# Patient Record
Sex: Male | Born: 1935
Health system: Southern US, Community
[De-identification: ages and names within clinical notes are randomized; demographics above are authoritative.]

## PROBLEM LIST (undated history)

## (undated) DIAGNOSIS — I4510 Unspecified right bundle-branch block: Secondary | ICD-10-CM

## (undated) DIAGNOSIS — K589 Irritable bowel syndrome without diarrhea: Secondary | ICD-10-CM

## (undated) DIAGNOSIS — C801 Malignant (primary) neoplasm, unspecified: Secondary | ICD-10-CM

## (undated) DIAGNOSIS — I700 Atherosclerosis of aorta: Secondary | ICD-10-CM

## (undated) DIAGNOSIS — E785 Hyperlipidemia, unspecified: Secondary | ICD-10-CM

## (undated) DIAGNOSIS — I255 Ischemic cardiomyopathy: Secondary | ICD-10-CM

## (undated) DIAGNOSIS — I219 Acute myocardial infarction, unspecified: Secondary | ICD-10-CM

## (undated) DIAGNOSIS — K449 Diaphragmatic hernia without obstruction or gangrene: Secondary | ICD-10-CM

## (undated) DIAGNOSIS — N401 Enlarged prostate with lower urinary tract symptoms: Secondary | ICD-10-CM

## (undated) DIAGNOSIS — G8929 Other chronic pain: Secondary | ICD-10-CM

## (undated) DIAGNOSIS — M47812 Spondylosis without myelopathy or radiculopathy, cervical region: Secondary | ICD-10-CM

## (undated) DIAGNOSIS — M545 Low back pain, unspecified: Secondary | ICD-10-CM

## (undated) DIAGNOSIS — I10 Essential (primary) hypertension: Secondary | ICD-10-CM

## (undated) DIAGNOSIS — G2581 Restless legs syndrome: Secondary | ICD-10-CM

## (undated) DIAGNOSIS — I2510 Atherosclerotic heart disease of native coronary artery without angina pectoris: Secondary | ICD-10-CM

## (undated) DIAGNOSIS — R55 Syncope and collapse: Secondary | ICD-10-CM

## (undated) DIAGNOSIS — D126 Benign neoplasm of colon, unspecified: Secondary | ICD-10-CM

## (undated) DIAGNOSIS — N138 Other obstructive and reflux uropathy: Secondary | ICD-10-CM

## (undated) DIAGNOSIS — K219 Gastro-esophageal reflux disease without esophagitis: Secondary | ICD-10-CM

## (undated) DIAGNOSIS — Z789 Other specified health status: Secondary | ICD-10-CM

## (undated) HISTORY — DX: Hyperlipidemia, unspecified: E78.5

## (undated) HISTORY — DX: Benign prostatic hyperplasia with lower urinary tract symptoms: N40.1

## (undated) HISTORY — DX: Unspecified right bundle-branch block: I45.10

## (undated) HISTORY — DX: Other specified health status: Z78.9

## (undated) HISTORY — DX: Benign neoplasm of colon, unspecified: D12.6

## (undated) HISTORY — DX: Syncope and collapse: R55

## (undated) HISTORY — DX: Low back pain, unspecified: M54.50

## (undated) HISTORY — DX: Irritable bowel syndrome, unspecified: K58.9

## (undated) HISTORY — DX: Benign prostatic hyperplasia with lower urinary tract symptoms: N13.8

## (undated) HISTORY — PX: TONSILLECTOMY: SUR1361

## (undated) HISTORY — DX: Ischemic cardiomyopathy: I25.5

## (undated) HISTORY — PX: OTHER SURGICAL HISTORY: SHX169

## (undated) HISTORY — DX: Essential (primary) hypertension: I10

## (undated) HISTORY — DX: Atherosclerosis of aorta: I70.0

## (undated) HISTORY — DX: Gastro-esophageal reflux disease without esophagitis: K21.9

## (undated) HISTORY — DX: Acute myocardial infarction, unspecified: I21.9

## (undated) HISTORY — DX: Spondylosis without myelopathy or radiculopathy, cervical region: M47.812

## (undated) HISTORY — DX: Diaphragmatic hernia without obstruction or gangrene: K44.9

## (undated) HISTORY — DX: Atherosclerotic heart disease of native coronary artery without angina pectoris: I25.10

## (undated) HISTORY — DX: Other chronic pain: G89.29

## (undated) HISTORY — DX: Low back pain: M54.5

## (undated) HISTORY — DX: Restless legs syndrome: G25.81

---

## 2001-12-28 HISTORY — PX: LUMBAR FUSION: SHX111

## 2002-01-31 ENCOUNTER — Encounter: Payer: Self-pay | Admitting: Family Medicine

## 2004-02-05 ENCOUNTER — Encounter: Payer: Self-pay | Admitting: Cardiology

## 2010-06-12 ENCOUNTER — Encounter: Payer: Self-pay | Admitting: Family Medicine

## 2010-06-25 ENCOUNTER — Encounter: Payer: Self-pay | Admitting: Family Medicine

## 2010-10-06 ENCOUNTER — Telehealth: Payer: Self-pay | Admitting: Family Medicine

## 2010-10-15 ENCOUNTER — Ambulatory Visit: Payer: Self-pay | Admitting: Family Medicine

## 2010-10-15 ENCOUNTER — Encounter: Payer: Self-pay | Admitting: Family Medicine

## 2010-10-15 DIAGNOSIS — I25118 Atherosclerotic heart disease of native coronary artery with other forms of angina pectoris: Secondary | ICD-10-CM | POA: Insufficient documentation

## 2010-10-15 DIAGNOSIS — N401 Enlarged prostate with lower urinary tract symptoms: Secondary | ICD-10-CM | POA: Insufficient documentation

## 2010-10-15 DIAGNOSIS — I252 Old myocardial infarction: Secondary | ICD-10-CM | POA: Insufficient documentation

## 2010-10-15 DIAGNOSIS — G2581 Restless legs syndrome: Secondary | ICD-10-CM | POA: Insufficient documentation

## 2010-10-15 DIAGNOSIS — Z860100 Personal history of colon polyps, unspecified: Secondary | ICD-10-CM | POA: Insufficient documentation

## 2010-10-15 DIAGNOSIS — N138 Other obstructive and reflux uropathy: Secondary | ICD-10-CM | POA: Insufficient documentation

## 2010-10-15 DIAGNOSIS — K219 Gastro-esophageal reflux disease without esophagitis: Secondary | ICD-10-CM | POA: Insufficient documentation

## 2010-10-15 DIAGNOSIS — I10 Essential (primary) hypertension: Secondary | ICD-10-CM | POA: Insufficient documentation

## 2010-10-15 DIAGNOSIS — N400 Enlarged prostate without lower urinary tract symptoms: Secondary | ICD-10-CM | POA: Insufficient documentation

## 2010-10-15 DIAGNOSIS — E785 Hyperlipidemia, unspecified: Secondary | ICD-10-CM | POA: Insufficient documentation

## 2010-10-15 LAB — CONVERTED CEMR LAB
Blood in Urine, dipstick: NEGATIVE
Glucose, Urine, Semiquant: NEGATIVE
Specific Gravity, Urine: 1.01
WBC Urine, dipstick: NEGATIVE
pH: 7

## 2010-10-17 ENCOUNTER — Telehealth: Payer: Self-pay | Admitting: Family Medicine

## 2010-10-17 LAB — CONVERTED CEMR LAB
AST: 26 units/L (ref 0–37)
Albumin: 4.3 g/dL (ref 3.5–5.2)
Basophils Absolute: 0 10*3/uL (ref 0.0–0.1)
CO2: 24 meq/L (ref 19–32)
Chloride: 107 meq/L (ref 96–112)
Eosinophils Absolute: 0.1 10*3/uL (ref 0.0–0.7)
Glucose, Bld: 91 mg/dL (ref 70–99)
HCT: 40.1 % (ref 39.0–52.0)
Hemoglobin: 13.9 g/dL (ref 13.0–17.0)
Lymphs Abs: 1.5 10*3/uL (ref 0.7–4.0)
MCHC: 34.6 g/dL (ref 30.0–36.0)
Neutro Abs: 2.6 10*3/uL (ref 1.4–7.7)
Potassium: 4.6 meq/L (ref 3.5–5.1)
RDW: 12.9 % (ref 11.5–14.6)
Sodium: 139 meq/L (ref 135–145)
TSH: 0.64 microintl units/mL (ref 0.35–5.50)
Total Protein: 7 g/dL (ref 6.0–8.3)

## 2010-10-30 ENCOUNTER — Ambulatory Visit: Payer: Self-pay | Admitting: Cardiology

## 2010-11-03 ENCOUNTER — Telehealth: Payer: Self-pay | Admitting: Cardiology

## 2010-11-10 ENCOUNTER — Telehealth (INDEPENDENT_AMBULATORY_CARE_PROVIDER_SITE_OTHER): Payer: Self-pay | Admitting: *Deleted

## 2010-11-27 ENCOUNTER — Encounter: Payer: Self-pay | Admitting: Family Medicine

## 2010-12-26 ENCOUNTER — Encounter: Payer: Self-pay | Admitting: Family Medicine

## 2011-01-27 NOTE — Progress Notes (Signed)
Summary: Request to be Worked In if Possible  Phone Note Call from Patient Call back at Home Phone [phone]   Caller: [name] Summary of Call: New pt appt scheduled for 11/25/10, if possible would like to be seen sooner.  Not having any problems at this time so if he can't be worked in sooner it is not a problem. Initial call taken by: Trixie Dredge,  October 06, 2010 3:00 PM  Follow-up for Phone Call        okay to work him in sometime soon  Follow-up by: Nelwyn Salisbury MD,  October 06, 2010 5:59 PM  Additional Follow-up for Phone Call Additional follow up Details #1::        appt scheduled for 10/15/10 @ 1:00pm Additional Follow-up by: Trixie Dredge,  October 07, 2010 9:21 AM

## 2011-01-27 NOTE — Progress Notes (Signed)
Summary: has ? about his labwork  Phone Note Call from Patient Call back at Home Phone [phone]   Caller: Patient---triage vm Reason for Call: Talk to Nurse Summary of Call: returning a call. has ? about his labwork. Initial call taken by: Warnell Forester,  October 17, 2010 11:26 AM  Follow-up for Phone Call        wants copy labs to be mailed done.  Follow-up by: Pura Spice, RN,  October 17, 2010 11:29 AM

## 2011-01-27 NOTE — Progress Notes (Signed)
  ROI faxed to [facility],Received Release back today chart was warehoused and [facility]  will copy records and Mail. Cala Bradford Mesiemore  November 10, 2010 11:45 AM     Appended Document:  Records received from [facility] gave to [facility]

## 2011-01-27 NOTE — Letter (Signed)
Summary: Records from Dr. Gerrit Friends. [place] 1996 - 2011  Records from Dr. Gerrit Friends. [place] 1996 - 2011   Imported By: Maryln Gottron 10/20/2010 14:02:55  _____________________________________________________________________  External Attachment:    Type:   Image     Comment:   External Document

## 2011-01-27 NOTE — Progress Notes (Signed)
Summary: re obtaining medical records  Phone Note Call from Patient   Caller: Patient [phone] Summary of Call: pt calling re [place] getting medical records from [facility] [phone] date  10-2001 by dr Lorenz Coaster at [facility] [address] [place]  [phone]  Initial call taken by: Glynda Jaeger,  November 03, 2010 9:17 AM    Release Of Information faxed to [facility] @ [phone] [facility]  November 05, 2010 10:03 AM

## 2011-01-27 NOTE — Letter (Signed)
Summary: [facility] - 2001 to 2005  [facility] - 2001 to 2005   Imported By: Marylou Mccoy 11/27/2010 10:03:54  _____________________________________________________________________  External Attachment:    Type:   Image     Comment:   External Document

## 2011-01-27 NOTE — Assessment & Plan Note (Signed)
Summary: np6/s/p cabg/MI/lg   Primary Provider:  Dr. Clent Ridges   History of Present Illness: 76 yo with history of CAD s/p MI in 1995 and CABG x 3 in 2002 presents to establish cardiology care. Patient reports his last stress test in 2008, and says that he was told that it was normal.  He is not on a statin as he says that multiple statins have caused him to get flu-like symptoms and elevated LFTs.  He is doing well in general.  He walks on a treadmill and lifts weight 3+ days/week.  He walks around the neighborhood with his wife.  No exertional dyspnea or chest pain.  He recently moved to [facility] from [place].  He no longer smokes (quit years ago).    ECG: NSR, RBBB, LAFB, old anterior MI  Labs (10/11): LDL 120, HDL 48, K 4.6, creatinine 1.1, TSH normal  Current Medications (verified): 1)  Tramadol Hcl 50 Mg Tabs (Tramadol Hcl) .... As Needed 2)  Pramipexole Dihydrochloride 1 Mg Tabs (Pramipexole Dihydrochloride) .Marland Kitchen.. 1and 1/2 At Bedtime 3)  Lisinopril 5 Mg Tabs (Lisinopril) .... Once Daily 4)  Nexium 40 Mg Cpdr (Esomeprazole Magnesium) .... Once Daily 5)  Aspir-Low 81 Mg Tbec (Aspirin) .... Once Daily 6)  Cholest Off 450 Mg Tabs (Plant Sterols and Stanols) .... Once Daily 7)  Omega 3 .... Once Daily 8)  Calcium .... Once Daily 9)  Saw Palmetto .... Once Daily 10)  New Vitality Ageless Male .... 800mg  By Mouth Two Times A Day.  Allergies: 1)  ! * Anthistamines 2)  ! * Statins  Past History:  Past Medical History: 1. Colonic polyps, hx of 2. Coronary artery disease: MI 1995, CABG x 3 2002 (patient says that he had LIMA and RIMA grafts), last stress in 2008 was normal per patient's report.  3. Hyperlipidemia: Intolerant to statins due to elevated LFTs and flu-like symptoms.  Says he has tried multiple statin.  4. Hypertension 5. UTIs 6. IBS 7. restless legs 8. Benign prostatic hypertrophy 9. GERD 10. History of lumbar fusion  Family History: Reviewed history from 10/15/2010 and no  changes required. Family History of CAD Male 1st degree relative <50 Family History Diabetes 1st degree relative Family History Hypertension  Social History: Retired, moved to [place] from [place] Married, has children in [place].  Former Smoker, quit around 1980.  Alcohol use-yes  Review of Systems       All systems reviewed and negative except as per HPI.   Vital Signs:  Patient profile:   76 year old male Height:      69.5 inches Weight:      196 pounds Pulse rate:   64 / minute Resp:     14 per minute BP sitting:   122 / 70  (right arm)  Vitals Entered By: Ellender Hose RN (October 30, 2010 11:47 AM)  Physical Exam  General:  Well developed, well nourished, in no acute distress. Head:  normocephalic and atraumatic Nose:  no deformity, discharge, inflammation, or lesions Mouth:  Teeth, gums and palate normal. Oral mucosa normal. Neck:  Neck supple, no JVD. No masses, thyromegaly or abnormal cervical nodes. Lungs:  Clear bilaterally to auscultation and percussion. Heart:  Non-displaced PMI, chest non-tender; regular rate and rhythm, S1, S2 without murmurs, rubs or gallops. Carotid upstroke normal, no bruit.  Pedals normal pulses. No edema, no varicosities. Abdomen:  Bowel sounds positive; abdomen soft and non-tender without masses, organomegaly, or hernias noted. No hepatosplenomegaly. Msk:  Back normal, normal gait. Muscle strength and tone normal. Extremities:  No clubbing or cyanosis. Neurologic:  Alert and oriented x 3. Skin:  Intact without lesions or rashes. Psych:  Normal affect.   Impression & Recommendations:  Problem # 1:  CORONARY ARTERY DISEASE (ICD-414.00) Patient has Qs on ECG suggestive of anterior MI.  He is having no exertional dyspnea or chest pain.  No significant cardiac symptoms at this time.   - Echo to assess LV function given extensive Qs.  - Continue ASA, lisinopril - Will try to get notes from prior cardiologist.  - Followup 1  year.   Problem # 2:  HYPERLIPIDEMIA (ICD-272.4) Patient has not been able to tolerate multiple statins due to flu-like illness and elevated LFTs. I will have him try Niaspan 1000 mg daily.  If he does not tolerate this, Zetia would be an option.   Patient Instructions: 1)  Your physician recommends that you schedule a follow-up appointment in: 1 yr with Dr Shirlee Latch 2)  Your physician has recommended you make the following change in your medication: start Niaspan 500 mg every evening 30 minutes after your asa.  Take this dose for one week then increase to 1,000 mg every evening. 3)  Your physician has requested that you have an echocardiogram in one year.  Echocardiography is a painless test that uses sound waves to create images of your heart. It provides your doctor with information about the size and shape of your heart and how well your heart's chambers and valves are working.  This procedure takes approximately one hour. There are no restrictions for this procedure. Prescriptions: NIASPAN 1000 MG CR-TABS (NIACIN (ANTIHYPERLIPIDEMIC)) one every evening 30 mins after your asa  #30 x 6   Entered by:   Charolotte Capuchin, RN   Authorized by:   Marca Ancona, MD   Signed by:   Charolotte Capuchin, RN on 10/30/2010   Method used:   Electronically to        [facility] [address] [id number]* (retail)       [address]       [place], [place]  [address]       Ph: [phone]       Fax: [phone]   RxID:   [id number]

## 2011-01-27 NOTE — Assessment & Plan Note (Signed)
Summary: MEDICARE WELLNESS EXAM//SLM   Vital Signs:  Patient profile:   76 year old male Height:      69.5 inches Weight:      195 pounds BMI:     28.49 Pulse rate:   60 / minute BP sitting:   120 / 72  (left arm)  Vitals Entered By: Pura Spice, RN (October 15, 2010 1:18 PM) CC: cpx fasting    History of Present Illness: 76 yr old male to establish with [place] after moving here from [place], [place] about 2 weeks ago. He is also for a cpx. He and his wife are here to spend time with their grandchildren. He feels good and is quite active.   Preventive Screening-Counseling & Management  Alcohol-Tobacco     Smoking Status: quit  Allergies (verified): 1)  ! * Anthistamines 2)  ! * Statins  Past History:  Past Medical History: Colonic polyps, hx of Coronary artery disease Hyperlipidemia Hypertension Myocardial infarction in 1995 UTIs last stress test was in 2008, normal IBS restless legs Benign prostatic hypertrophy GERD  Past Surgical History: Coronary artery bypass graft, 3 vessel, in 2002 Tonsillectomy last colonoscopy in July 2011, one benign polyp, repeat in 5 yrs Lumbar fusion in 2003 at L3-L4, using bone autografts and cadaveric bone grafts  Family History: Reviewed history and no changes required. Family History of CAD Male 1st degree relative <50 Family History Diabetes 1st degree relative Family History Hypertension  Social History: Reviewed history and no changes required. Retired Married Former Smoker Alcohol use-yes Smoking Status:  quit  Review of Systems  The patient denies anorexia, fever, weight loss, weight gain, vision loss, decreased hearing, hoarseness, chest pain, syncope, dyspnea on exertion, peripheral edema, prolonged cough, headaches, hemoptysis, abdominal pain, melena, hematochezia, severe indigestion/heartburn, hematuria, incontinence, genital sores, muscle weakness, suspicious skin lesions, transient blindness, difficulty walking,  depression, unusual weight change, abnormal bleeding, enlarged lymph nodes, angioedema, breast masses, and testicular masses.         Flu Vaccine Consent Questions     Do you have a history of severe allergic reactions to this vaccine? no    Any prior history of allergic reactions to egg and/or gelatin? no    Do you have a sensitivity to the preservative Thimersol? no    Do you have a past history of Guillan-Barre Syndrome? no    Do you currently have an acute febrile illness? no    Have you ever had a severe reaction to latex? no    Vaccine information given and explained to patient? yes    Are you currently pregnant? no    Lot Number:[id number]   Exp Date:06/27/2011   Site Given  Left Deltoid IM Pura Spice, RN  October 15, 2010 1:19 PM   Physical Exam  General:  Well-developed,well-nourished,in no acute distress; alert,appropriate and cooperative throughout examination Head:  Normocephalic and atraumatic without obvious abnormalities. No apparent alopecia or balding. Eyes:  No corneal or conjunctival inflammation noted. EOMI. Perrla. Funduscopic exam benign, without hemorrhages, exudates or papilledema. Vision grossly normal. Ears:  External ear exam shows no significant lesions or deformities.  Otoscopic examination reveals clear canals, tympanic membranes are intact bilaterally without bulging, retraction, inflammation or discharge. Hearing is grossly normal bilaterally. Nose:  External nasal examination shows no deformity or inflammation. Nasal mucosa are pink and moist without lesions or exudates. Mouth:  Oral mucosa and oropharynx without lesions or exudates.  Teeth in good repair. Neck:  No deformities, masses, or tenderness  noted. Chest Wall:  No deformities, masses, tenderness or gynecomastia noted. Lungs:  Normal respiratory effort, chest expands symmetrically. Lungs are clear to auscultation, no crackles or wheezes. Heart:  Normal rate and regular rhythm. S1 and S2 normal  without gallop, murmur, click, rub or other extra sounds. EKG normal with RBBB (at his baseline). Abdomen:  Bowel sounds positive,abdomen soft and non-tender without masses, organomegaly or hernias noted. Rectal:  No external abnormalities noted. Normal sphincter tone. No rectal masses or tenderness. Heme neg. Genitalia:  Testes bilaterally descended without nodularity, tenderness or masses. No scrotal masses or lesions. No penis lesions or urethral discharge. Prostate:  no nodules, no asymmetry, no induration, and 2+ enlarged.   Msk:  No deformity or scoliosis noted of thoracic or lumbar spine.   Pulses:  R and L carotid,radial,femoral,dorsalis pedis and posterior tibial pulses are full and equal bilaterally Extremities:  No clubbing, cyanosis, edema, or deformity noted with normal full range of motion of all joints.   Neurologic:  No cranial nerve deficits noted. Station and gait are normal. Plantar reflexes are down-going bilaterally. DTRs are symmetrical throughout. Sensory, motor and coordinative functions appear intact. Skin:  Intact without suspicious lesions or rashes Cervical Nodes:  No lymphadenopathy noted Axillary Nodes:  No palpable lymphadenopathy Inguinal Nodes:  No significant adenopathy Psych:  Cognition and judgment appear intact. Alert and cooperative with normal attention span and concentration. No apparent delusions, illusions, hallucinations   Impression & Recommendations:  Problem # 1:  BENIGN PROSTATIC HYPERTROPHY, WITH OBSTRUCTION (ICD-600.01)  Orders: TLB-PSA (Prostate Specific Antigen) (84153-PSA)  Problem # 2:  RESTLESS LEGS SYNDROME (ICD-333.94)  Problem # 3:  MYOCARDIAL INFARCTION, HX OF (ICD-412)  His updated medication list for this problem includes:    Lisinopril 5 Mg Tabs (Lisinopril) ..... Once daily    Aspir-low 81 Mg Tbec (Aspirin) ..... Once daily  Problem # 4:  HYPERTENSION (ICD-401.9)  His updated medication list for this problem includes:     Lisinopril 5 Mg Tabs (Lisinopril) ..... Once daily  Orders: UA Dipstick w/o Micro (automated)  (81003) Venipuncture (04540) TLB-Lipid Panel (80061-LIPID) TLB-BMP (Basic Metabolic Panel-BMET) (80048-METABOL) TLB-CBC Platelet - w/Differential (85025-CBCD) TLB-Hepatic/Liver Function Pnl (80076-HEPATIC) TLB-TSH (Thyroid Stimulating Hormone) (84443-TSH)  Problem # 5:  HYPERLIPIDEMIA (ICD-272.4)  Problem # 6:  CORONARY ARTERY DISEASE (ICD-414.00)  His updated medication list for this problem includes:    Lisinopril 5 Mg Tabs (Lisinopril) ..... Once daily    Aspir-low 81 Mg Tbec (Aspirin) ..... Once daily  Orders: EKG w/ Interpretation (93000) Cardiology Referral (Cardiology)  Problem # 7:  COLONIC POLYPS, HX OF (ICD-V12.72)  Orders: Hemoccult Guaiac-1 spec.(in office) (82270)  Complete Medication List: 1)  Tramadol Hcl 50 Mg Tabs (Tramadol hcl) .... As needed 2)  Pramipexole Dihydrochloride 1 Mg Tabs (Pramipexole dihydrochloride) .Marland Kitchen.. 1and 1/2 at bedtime 3)  Lisinopril 5 Mg Tabs (Lisinopril) .... Once daily 4)  Nexium 40 Mg Cpdr (Esomeprazole magnesium) .... Once daily 5)  Aspir-low 81 Mg Tbec (Aspirin) .... Once daily 6)  Cholest Off 450 Mg Tabs (Plant sterols and stanols) .... Once daily 7)  Omega 3  .... Once daily 8)  Calcium  .... Once daily 9)  Saw Palmetto  .... Once daily  Other Orders: Flu Vaccine 37yrs + MEDICARE PATIENTS (J8119) Administration Flu vaccine - MCR (J4782)  Patient Instructions: 1)  get fasting labs today. 2)  Will refer to Cardiology to follow him along with [place].    Orders Added: 1)  Flu Vaccine 61yrs + MEDICARE PATIENTS [  Q2039] 2)  Administration Flu vaccine - MCR [G0008] 3)  New Patient Level IV [99204] 4)  UA Dipstick w/o Micro (automated)  [81003] 5)  Hemoccult Guaiac-1 spec.(in office) [82270] 6)  EKG w/ Interpretation [93000] 7)  Venipuncture [36415] 8)  TLB-Lipid Panel [80061-LIPID] 9)  TLB-BMP (Basic Metabolic Panel-BMET)  [80048-METABOL] 10)  TLB-CBC Platelet - w/Differential [85025-CBCD] 11)  TLB-Hepatic/Liver Function Pnl [80076-HEPATIC] 12)  TLB-TSH (Thyroid Stimulating Hormone) [84443-TSH] 13)  TLB-PSA (Prostate Specific Antigen) [09811-BJY] 14)  Cardiology Referral [Cardiology]    Laboratory Results   Urine Tests  Date/Time Recieved: October 15, 2010 3:19 PM  Date/Time Reported: October 15, 2010 3:19 PM   Routine Urinalysis   Color: yellow Appearance: Clear Glucose: negative   (Normal Range: Negative) Bilirubin: negative   (Normal Range: Negative) Ketone: negative   (Normal Range: Negative) Spec. Gravity: 1.010   (Normal Range: 1.003-1.035) Blood: negative   (Normal Range: Negative) pH: 7.0   (Normal Range: 5.0-8.0) Protein: negative   (Normal Range: Negative) Urobilinogen: 0.2   (Normal Range: 0-1) Nitrite: negative   (Normal Range: Negative) Leukocyte Esterace: negative   (Normal Range: Negative)    Comments: Wynona Canes, CMA  October 15, 2010 3:19 PM     Appended Document: MEDICARE WELLNESS EXAM//SLM In addition to the HPI above: The patient is active and exercises regularly. He denies any SOB or chest pain despite his hx of CAD. he has some mild prostatic symptoms and has nocturia once or twice a night. He follows his BP closely at home, and it remains stable. He watches his diet carefully, especially as far as fats go. His Bms are regular ,and he denies seeing any blood or dark stools.

## 2011-01-27 NOTE — Letter (Signed)
Summary: Records from [facility] 2006 - 2011  Records from [facility] 2006 - 2011   Imported By: Maryln Gottron 11/11/2010 11:05:21  _____________________________________________________________________  External Attachment:    Type:   Image     Comment:   External Document

## 2011-01-29 NOTE — Letter (Signed)
Summary: [facility]  [facility]   Imported By: Maryln Gottron 01/07/2011 13:51:46  _____________________________________________________________________  External Attachment:    Type:   Image     Comment:   External Document

## 2011-01-29 NOTE — Letter (Signed)
Summary: [facility]  [facility]   Imported By: Maryln Gottron 12/10/2010 14:57:30  _____________________________________________________________________  External Attachment:    Type:   Image     Comment:   External Document

## 2011-07-19 ENCOUNTER — Other Ambulatory Visit: Payer: Self-pay | Admitting: Cardiology

## 2011-07-29 ENCOUNTER — Telehealth: Payer: Self-pay | Admitting: Family Medicine

## 2011-07-29 MED ORDER — TRAMADOL HCL 50 MG PO TABS
50.0000 mg | ORAL_TABLET | Freq: Four times a day (QID) | ORAL | Status: DC | PRN
Start: 2011-07-29 — End: 2012-05-07

## 2011-07-29 MED ORDER — LISINOPRIL 5 MG PO TABS: 5.0000 mg | ORAL_TABLET | Freq: Every day | ORAL | Status: DC

## 2011-07-29 MED ORDER — PRAMIPEXOLE DIHYDROCHLORIDE 1 MG PO TABS: 1.0000 mg | ORAL_TABLET | Freq: Every day | ORAL | Status: DC

## 2011-07-29 NOTE — Telephone Encounter (Signed)
The patient is calling for refills on meds. He moved here from [place], [place] in October 2011 and this will be the first time he needs refills here. Please call in the following=  Tramadol 50 mg, take 4 times a day, #120 with 5 rf, also Mirapex 1 mg, take 1 and 1/2 tabs at bedtime, #45 with 11 rf, ans also Lisinopril 5 mg qd, #30 with 11 rf to [facility] at [phone]

## 2011-07-29 NOTE — Telephone Encounter (Signed)
Please call these in  

## 2011-07-29 NOTE — Telephone Encounter (Signed)
Scripts called in

## 2011-11-25 ENCOUNTER — Encounter: Payer: Self-pay | Admitting: Cardiology

## 2011-11-25 ENCOUNTER — Ambulatory Visit (INDEPENDENT_AMBULATORY_CARE_PROVIDER_SITE_OTHER): Payer: Medicare Other | Admitting: Cardiology

## 2011-11-25 DIAGNOSIS — I2510 Atherosclerotic heart disease of native coronary artery without angina pectoris: Secondary | ICD-10-CM

## 2011-11-25 DIAGNOSIS — E7800 Pure hypercholesterolemia, unspecified: Secondary | ICD-10-CM

## 2011-11-25 DIAGNOSIS — E785 Hyperlipidemia, unspecified: Secondary | ICD-10-CM

## 2011-11-25 DIAGNOSIS — I25810 Atherosclerosis of coronary artery bypass graft(s) without angina pectoris: Secondary | ICD-10-CM

## 2011-11-25 LAB — HEPATIC FUNCTION PANEL
Albumin: 4.4 g/dL (ref 3.5–5.2)
Total Protein: 7.1 g/dL (ref 6.0–8.3)

## 2011-11-25 LAB — CBC WITH DIFFERENTIAL/PLATELET
Basophils Absolute: 0 10*3/uL (ref 0.0–0.1)
Basophils Relative: 0.7 % (ref 0.0–3.0)
Hemoglobin: 13.4 g/dL (ref 13.0–17.0)
Lymphocytes Relative: 44.2 % (ref 12.0–46.0)
Monocytes Relative: 9.5 % (ref 3.0–12.0)
Neutro Abs: 2 10*3/uL (ref 1.4–7.7)
RBC: 3.81 Mil/uL — ABNORMAL LOW (ref 4.22–5.81)
RDW: 13.4 % (ref 11.5–14.6)
WBC: 4.7 10*3/uL (ref 4.5–10.5)

## 2011-11-25 LAB — BASIC METABOLIC PANEL
CO2: 22 mEq/L (ref 19–32)
Chloride: 108 mEq/L (ref 96–112)
Creatinine, Ser: 1.1 mg/dL (ref 0.4–1.5)

## 2011-11-25 LAB — LIPID PANEL
Cholesterol: 186 mg/dL (ref 0–200)
HDL: 50.8 mg/dL (ref >39.00)
LDL Cholesterol: 112 mg/dL — ABNORMAL HIGH (ref 0–99)
Triglycerides: 118 mg/dL (ref 0.0–149.0)
VLDL: 23.6 mg/dL (ref 0.0–40.0)

## 2011-11-25 MED ORDER — NIACIN ER (ANTIHYPERLIPIDEMIC) 1000 MG PO TBCR: 2000.0000 mg | EXTENDED_RELEASE_TABLET | Freq: Every day | ORAL | Status: DC

## 2011-11-25 NOTE — Patient Instructions (Addendum)
  Your physician has requested that you have an echocardiogram. Echocardiography is a painless test that uses sound waves to create images of your heart. It provides your doctor with information about the size and shape of your heart and how well your heart's chambers and valves are working. This procedure takes approximately one hour. There are no restrictions for this procedure.   Please have blood work done today.  BMET, Lipid, Hepatic function, and CBC; 272.0, 414.05  Your physician has recommended you make the following change in your medication: Increase your Niaspan to 2000 mg daily.  This will be two tablets of the 1000 mg every evening  Your physician wants you to follow-up in: 1 year.   You will receive a reminder letter in the mail two months in advance. If you don't receive a letter, please call our office to schedule the follow-up appointment.

## 2011-11-26 ENCOUNTER — Encounter: Payer: Self-pay | Admitting: Cardiology

## 2011-11-26 NOTE — Assessment & Plan Note (Signed)
History of CABG.  No ischemic symptoms.  I would like him to get an echo for baseline evaluation of LV systolic function.  He has extensive Qs on ECG.  Continue ASA 81, ACEI, niaspan.

## 2011-11-26 NOTE — Progress Notes (Signed)
PCP: Dr. Clent Ridges  76 yo with history of CAD s/p MI in 1995 and CABG x 3 in 2002 presents for cardiology followup. Patient reports his last stress test in 2008, and says that he was told that it was normal. He is not on a statin as he says that multiple statins have caused him to get flu-like symptoms and elevated LFTs. I had him start Niaspan last year.  He is doing well in general. He walks on a treadmill and lifts weight 3+ days/week. He walks around the neighborhood with his wife and does yardwork like raking. No exertional dyspnea or chest pain. Occasional GERD after meals.    ECG: NSR, RBBB, LAFB, old anterior and lateral MIs   Labs (10/11): LDL 120, HDL 48, K 4.6, creatinine 1.1, TSH normal   Allergies:  1) ! * Anthistamines  2) ! * Statins   Past Medical History:  1. Colonic polyps, hx of  2. Coronary artery disease: MI 1995, CABG x 3 2002 (patient says that he had LIMA and RIMA grafts), last stress in 2008 was normal per patient's report.  3. Hyperlipidemia: Intolerant to statins due to elevated LFTs and flu-like symptoms. Says he has tried multiple statin.  4. Hypertension  5. UTIs  6. IBS  7. restless legs  8. Benign prostatic hypertrophy  9. GERD  10. History of lumbar fusion   Family History:  Family History of CAD Male 1st degree relative <50  Family History Diabetes 1st degree relative  Family History Hypertension   Social History:  Retired, moved to [place] from [place]  Married, has children in [place].  Former Smoker, quit around 1980.  Alcohol use-yes   Review of Systems  All systems reviewed and negative except as per HPI.   Current Outpatient Prescriptions  Medication Sig Dispense Refill  . aspirin 81 MG tablet Take 81 mg by mouth daily.        Marland Kitchen lisinopril (PRINIVIL,ZESTRIL) 5 MG tablet Take 1 tablet (5 mg total) by mouth daily.  30 tablet  11  . niacin (NIASPAN) 1000 MG CR tablet Take 2 tablets (2,000 mg total) by mouth at bedtime.  60 tablet  4  .  pramipexole (MIRAPEX) 1 MG tablet Take 1 tablet (1 mg total) by mouth at bedtime.  45 tablet  11  . Silodosin (RAPAFLO PO) Take by mouth.        . traMADol (ULTRAM) 50 MG tablet Take 1 tablet (50 mg total) by mouth every 6 (six) hours as needed for pain.  120 tablet  5    BP 118/70  Pulse 58  Ht 5\' 8"  (1.727 m)  Wt 92.588 kg (204 lb 1.9 oz)  BMI 31.04 kg/m2 General: NAD Neck: No JVD, no thyromegaly or thyroid nodule.  Lungs: Clear to auscultation bilaterally with normal respiratory effort. CV: Nondisplaced PMI.  Heart regular S1/S2, no S3/S4, no murmur.  No peripheral edema.  No carotid bruit.  Normal pedal pulses.  Abdomen: Soft, nontender, no hepatosplenomegaly, no distention.   Neurologic: Alert and oriented x 3.  Psych: Normal affect. Extremities: No clubbing or cyanosis.

## 2011-11-26 NOTE — Assessment & Plan Note (Signed)
Check lipids, LFTs.  Goal LDL is < 70.  He has not been able to tolerate any statins but can take Niaspan.  I will increase Niaspan to 2000 mg daily.

## 2011-11-30 ENCOUNTER — Ambulatory Visit (HOSPITAL_COMMUNITY): Payer: Medicare Other | Attending: Cardiology | Admitting: Radiology

## 2011-11-30 ENCOUNTER — Telehealth: Payer: Self-pay | Admitting: *Deleted

## 2011-11-30 DIAGNOSIS — E785 Hyperlipidemia, unspecified: Secondary | ICD-10-CM | POA: Insufficient documentation

## 2011-11-30 DIAGNOSIS — I252 Old myocardial infarction: Secondary | ICD-10-CM | POA: Insufficient documentation

## 2011-11-30 DIAGNOSIS — I10 Essential (primary) hypertension: Secondary | ICD-10-CM | POA: Insufficient documentation

## 2011-11-30 DIAGNOSIS — I25810 Atherosclerosis of coronary artery bypass graft(s) without angina pectoris: Secondary | ICD-10-CM

## 2011-11-30 DIAGNOSIS — I2510 Atherosclerotic heart disease of native coronary artery without angina pectoris: Secondary | ICD-10-CM

## 2011-11-30 DIAGNOSIS — I379 Nonrheumatic pulmonary valve disorder, unspecified: Secondary | ICD-10-CM | POA: Insufficient documentation

## 2011-11-30 NOTE — Telephone Encounter (Signed)
Notes Recorded by Jacqlyn Krauss, RN on 11/30/2011 at 3:00 PM Discussed with pt. Pt will return for lipid profile 02/02/12. Notes Recorded by Marca Ancona, MD on 11/27/2011 at 8:47 AM LDL is high. Increase Niaspan to 2000 as discussed in office and repeat lipids in 2 months.

## 2011-12-08 ENCOUNTER — Telehealth: Payer: Self-pay | Admitting: Cardiology

## 2011-12-08 NOTE — Telephone Encounter (Signed)
Patient aware of echo results.

## 2011-12-08 NOTE — Telephone Encounter (Signed)
Fu call °Pt returning your call  °

## 2012-02-02 ENCOUNTER — Other Ambulatory Visit (INDEPENDENT_AMBULATORY_CARE_PROVIDER_SITE_OTHER): Payer: Medicare Other | Admitting: *Deleted

## 2012-02-02 DIAGNOSIS — E785 Hyperlipidemia, unspecified: Secondary | ICD-10-CM

## 2012-02-02 DIAGNOSIS — I25810 Atherosclerosis of coronary artery bypass graft(s) without angina pectoris: Secondary | ICD-10-CM

## 2012-02-02 LAB — LIPID PANEL
HDL: 43.9 mg/dL (ref >39.00)
LDL Cholesterol: 109 mg/dL — ABNORMAL HIGH (ref 0–99)
Total CHOL/HDL Ratio: 4
Triglycerides: 77 mg/dL (ref 0.0–149.0)

## 2012-04-17 ENCOUNTER — Other Ambulatory Visit: Payer: Self-pay | Admitting: Family Medicine

## 2012-05-07 ENCOUNTER — Other Ambulatory Visit: Payer: Self-pay | Admitting: Family Medicine

## 2012-05-11 NOTE — Telephone Encounter (Signed)
Call in #120 only. He needs an OV

## 2012-06-15 ENCOUNTER — Other Ambulatory Visit: Payer: Self-pay | Admitting: Family Medicine

## 2012-06-27 HISTORY — PX: PROSTATE SURGERY: SHX751

## 2012-07-06 ENCOUNTER — Ambulatory Visit (INDEPENDENT_AMBULATORY_CARE_PROVIDER_SITE_OTHER): Payer: Medicare Other | Admitting: Family Medicine

## 2012-07-06 ENCOUNTER — Encounter: Payer: Self-pay | Admitting: Family Medicine

## 2012-07-06 VITALS — BP 122/70 | HR 74 | Temp 98.5°F | Wt 206.0 lb

## 2012-07-06 DIAGNOSIS — I10 Essential (primary) hypertension: Secondary | ICD-10-CM

## 2012-07-06 DIAGNOSIS — G2581 Restless legs syndrome: Secondary | ICD-10-CM

## 2012-07-06 DIAGNOSIS — I2510 Atherosclerotic heart disease of native coronary artery without angina pectoris: Secondary | ICD-10-CM

## 2012-07-06 DIAGNOSIS — N401 Enlarged prostate with lower urinary tract symptoms: Secondary | ICD-10-CM

## 2012-07-06 DIAGNOSIS — N138 Other obstructive and reflux uropathy: Secondary | ICD-10-CM

## 2012-07-06 MED ORDER — TRAMADOL HCL 50 MG PO TABS: 50.0000 mg | ORAL_TABLET | Freq: Four times a day (QID) | ORAL | Status: DC | PRN

## 2012-07-06 MED ORDER — PRAMIPEXOLE DIHYDROCHLORIDE 1 MG PO TABS: 1.0000 mg | ORAL_TABLET | Freq: Every day | ORAL | Status: DC

## 2012-07-06 NOTE — Progress Notes (Signed)
  Subjective:    Patient ID: Rudi Bunyard, male    DOB: 11/10/1935, 77 y.o.   MRN: [id number]  HPI Here to follow up on restless legs and back pain. He is doing well in general. After his microwave prostate ablation per Dr. Wilson Zawadzki, he has been urinating more freely and he is sleeping through the night.    Review of Systems  Constitutional: Negative.   Respiratory: Negative.   Cardiovascular: Negative.   Genitourinary: Negative.   Musculoskeletal: Positive for back pain.       Objective:   Physical Exam  Constitutional: He appears well-developed and well-nourished.  Cardiovascular: Normal rate, regular rhythm, normal heart sounds and intact distal pulses.   Pulmonary/Chest: Effort normal and breath sounds normal.          Assessment & Plan:  Refilled his meds. He is doing quite well. To see Dr. Shirlee Latch soon

## 2012-08-26 ENCOUNTER — Other Ambulatory Visit: Payer: Self-pay | Admitting: Family Medicine

## 2012-09-23 ENCOUNTER — Other Ambulatory Visit: Payer: Self-pay | Admitting: Family Medicine

## 2012-11-29 ENCOUNTER — Ambulatory Visit (INDEPENDENT_AMBULATORY_CARE_PROVIDER_SITE_OTHER): Payer: Medicare Other | Admitting: Cardiology

## 2012-11-29 ENCOUNTER — Encounter: Payer: Self-pay | Admitting: Cardiology

## 2012-11-29 VITALS — BP 118/78 | HR 60 | Ht 68.0 in | Wt 205.0 lb

## 2012-11-29 DIAGNOSIS — I2510 Atherosclerotic heart disease of native coronary artery without angina pectoris: Secondary | ICD-10-CM

## 2012-11-29 DIAGNOSIS — I2589 Other forms of chronic ischemic heart disease: Secondary | ICD-10-CM

## 2012-11-29 DIAGNOSIS — E785 Hyperlipidemia, unspecified: Secondary | ICD-10-CM

## 2012-11-29 DIAGNOSIS — I255 Ischemic cardiomyopathy: Secondary | ICD-10-CM | POA: Insufficient documentation

## 2012-11-29 MED ORDER — CARVEDILOL 6.25 MG PO TABS: 6.2500 mg | ORAL_TABLET | Freq: Two times a day (BID) | ORAL | Status: DC

## 2012-11-29 NOTE — Patient Instructions (Addendum)
Start coreg(carvedilol) 6.25mg  two times a day.  Take 2 niacin 1000mg  tablets at bedtime for a total of 2000mg  at bedtime. You can get this without a prescription.   Your physician recommends that you return for a FASTING lipid profile/BMET in 2 months.  Your physician wants you to follow-up in: 1 year with Dr Shirlee Latch. (December 2014).You will receive a reminder letter in the mail two months in advance. If you don't receive a letter, please call our office to schedule the follow-up appointment.

## 2012-11-29 NOTE — Progress Notes (Signed)
Patient ID: Charles Coffey, male   DOB: 06-17-1935, 76 y.o.   MRN: [id number] PCP: Dr. Clent Ridges  77 yo with history of CAD s/p MI in 1995 and CABG x 3 in 2002 presents for cardiology followup. Patient reports his last stress test in 2008, and says that he was told that it was normal. He is not on a statin as he says that multiple statins have caused him to get flu-like symptoms and elevated LFTs. I had him start Niaspan but he has been off it for 2 months because his pharmacy does not carry it.  He wants to know if he can take Niacin instead.  He is doing well in general. He walks on a treadmill and lifts weight 3+ days/week. He walks around the neighborhood with his wife and does yardwork like raking. No exertional dyspnea or chest pain. Main complaint is low back pain that is getting worse.  He had a back operation in the past and is contemplating re-do lumbar spine surgery.  Echo last year showed EF 45-50% with apical aneurysm.    ECG: NSR, RBBB, LAFB, old anterior MI.    Labs (10/11): LDL 120, HDL 48, K 4.6, creatinine 1.1, TSH normal  Labs (11/12): K 4.3, creatinine 1.1 Labs (2/13): LDL 109, HDL 44  Allergies:  1) ! * Anthistamines  2) ! * Statins   Past Medical History:  1. Colonic polyps, hx of  2. Coronary artery disease: MI 1995, CABG x 3 2002 (patient says that he had LIMA and RIMA grafts), last stress in 2008 was normal per patient's report.  3. Hyperlipidemia: Intolerant to statins due to elevated LFTs and flu-like symptoms. Says he has tried multiple statin.  4. Hypertension  5. UTIs  6. IBS  7. restless legs  8. Benign prostatic hypertrophy  9. GERD  10. History of lumbar fusion  11. Ischemic cardiomyopathy: Echo (12/12) with EF 45-50%, apical aneurysm, mild to moderate LAE.   Family History:  Family History of CAD Male 1st degree relative <50  Family History Diabetes 1st degree relative  Family History Hypertension   Social History:  Retired, moved to [place] from [place]   Married, has children in [place].  Former Smoker, quit around 1980.  Alcohol use-yes   Review of Systems  All systems reviewed and negative except as per HPI.   Current Outpatient Prescriptions  Medication Sig Dispense Refill  . aspirin 81 MG tablet Take 81 mg by mouth daily.        Marland Kitchen lidocaine (LIDODERM) 5 % Place 1 patch onto the skin as needed.       Marland Kitchen lisinopril (PRINIVIL,ZESTRIL) 5 MG tablet TAKE 1 TABLET BY MOUTH EVERY DAY  30 tablet  11  . pramipexole (MIRAPEX) 1 MG tablet Take 1 tablet (1 mg total) by mouth at bedtime.  45 tablet  11  . traMADol (ULTRAM) 50 MG tablet Take 1 tablet (50 mg total) by mouth every 6 (six) hours as needed for pain.  120 tablet  5  . niacin (NIASPAN) 1000 MG CR tablet Take 2 tablets (2,000 mg total) by mouth at bedtime.  60 tablet  4    BP 118/78  Pulse 60  Ht 5\' 8"  (1.727 m)  Wt 205 lb (92.987 kg)  BMI 31.17 kg/m2 General: NAD Neck: No JVD, no thyromegaly or thyroid nodule.  Lungs: Clear to auscultation bilaterally with normal respiratory effort. CV: Nondisplaced PMI.  Heart regular S1/S2, no S3/S4, no murmur.  No peripheral edema.  No carotid bruit.  Normal pedal pulses.  Abdomen: Soft, nontender, no hepatosplenomegaly, no distention.   Neurologic: Alert and oriented x 3.  Psych: Normal affect. Extremities: No clubbing or cyanosis.   Assessment/Plan:  HYPERLIPIDEMIA  He has not been able to tolerate any statins but can take Niacin. He has not been able to get Niaspan at his pharmacy, it would be ok to take Niacin 2000 mg daily.  Lipids in 2 months.  CORONARY ARTERY DISEASE  History of CABG. No ischemic symptoms. Continue ASA 81, ACEI, niacin.  Will add beta blocker today.  Ischemic Cardiomyopathy EF 45-50% on echo last year with apical aneurysm.  He is on lisinopril 5 mg daily.  I will add Coreg 6.25 mg bid.   Followup in 1 year.   Marca Ancona 11/29/2012

## 2012-12-29 ENCOUNTER — Other Ambulatory Visit: Payer: Self-pay | Admitting: Neurosurgery

## 2012-12-29 DIAGNOSIS — M542 Cervicalgia: Secondary | ICD-10-CM

## 2012-12-31 ENCOUNTER — Ambulatory Visit
Admission: RE | Admit: 2012-12-31 | Discharge: 2012-12-31 | Disposition: A | Discharge status: Home / Self Care | Payer: Medicare Other | Source: Ambulatory Visit | Attending: Neurosurgery | Admitting: Neurosurgery

## 2012-12-31 DIAGNOSIS — M542 Cervicalgia: Secondary | ICD-10-CM

## 2013-01-28 ENCOUNTER — Other Ambulatory Visit: Payer: Self-pay | Admitting: Family Medicine

## 2013-01-30 ENCOUNTER — Other Ambulatory Visit (INDEPENDENT_AMBULATORY_CARE_PROVIDER_SITE_OTHER): Payer: Medicare Other

## 2013-01-30 DIAGNOSIS — I2510 Atherosclerotic heart disease of native coronary artery without angina pectoris: Secondary | ICD-10-CM

## 2013-01-30 LAB — BASIC METABOLIC PANEL
BUN: 11 mg/dL (ref 6–23)
GFR: 73.83 mL/min (ref >60.00)
Potassium: 4.2 mEq/L (ref 3.5–5.1)
Sodium: 138 mEq/L (ref 135–145)

## 2013-01-30 LAB — LIPID PANEL
Cholesterol: 173 mg/dL (ref 0–200)
Triglycerides: 72 mg/dL (ref 0.0–149.0)
VLDL: 14.4 mg/dL (ref 0.0–40.0)

## 2013-01-31 NOTE — Telephone Encounter (Signed)
Call in #120 with 5 rf 

## 2013-07-30 ENCOUNTER — Other Ambulatory Visit: Payer: Self-pay | Admitting: Family Medicine

## 2013-08-01 NOTE — Telephone Encounter (Signed)
Okay for 6 months of both

## 2013-08-22 ENCOUNTER — Other Ambulatory Visit: Payer: Self-pay | Admitting: Family Medicine

## 2013-08-27 ENCOUNTER — Other Ambulatory Visit: Payer: Self-pay | Admitting: Family Medicine

## 2013-08-29 NOTE — Telephone Encounter (Signed)
Call in #120 with no refills. He needs an OV soon

## 2013-09-22 ENCOUNTER — Other Ambulatory Visit: Payer: Self-pay | Admitting: Family Medicine

## 2013-09-29 ENCOUNTER — Other Ambulatory Visit: Payer: Self-pay | Admitting: Family Medicine

## 2013-09-29 NOTE — Telephone Encounter (Signed)
Call in #120 with no rf. He needs an OV soon

## 2013-10-17 ENCOUNTER — Other Ambulatory Visit: Payer: Self-pay | Admitting: Cardiology

## 2013-11-02 ENCOUNTER — Other Ambulatory Visit: Payer: Self-pay | Admitting: Family Medicine

## 2013-11-09 ENCOUNTER — Other Ambulatory Visit: Payer: Self-pay | Admitting: Family Medicine

## 2013-11-09 ENCOUNTER — Encounter: Payer: Self-pay | Admitting: Family Medicine

## 2013-11-10 ENCOUNTER — Encounter: Payer: Self-pay | Admitting: Family Medicine

## 2013-11-10 ENCOUNTER — Ambulatory Visit (INDEPENDENT_AMBULATORY_CARE_PROVIDER_SITE_OTHER): Payer: Medicare Other | Admitting: Family Medicine

## 2013-11-10 VITALS — BP 110/66 | HR 58 | Temp 97.8°F | Wt 202.0 lb

## 2013-11-10 DIAGNOSIS — G2581 Restless legs syndrome: Secondary | ICD-10-CM

## 2013-11-10 DIAGNOSIS — M545 Low back pain, unspecified: Secondary | ICD-10-CM

## 2013-11-10 DIAGNOSIS — G8929 Other chronic pain: Secondary | ICD-10-CM

## 2013-11-10 DIAGNOSIS — I10 Essential (primary) hypertension: Secondary | ICD-10-CM

## 2013-11-10 MED ORDER — TRAMADOL HCL 50 MG PO TABS: ORAL_TABLET | ORAL | Status: DC

## 2013-11-10 MED ORDER — PRAMIPEXOLE DIHYDROCHLORIDE 1 MG PO TABS: ORAL_TABLET | ORAL | Status: DC

## 2013-11-10 NOTE — Progress Notes (Signed)
Pre visit review using our clinic review tool, if applicable. No additional management support is needed unless otherwise documented below in the visit note. 

## 2013-11-10 NOTE — Progress Notes (Signed)
  Subjective:    Patient ID: Charles Coffey, male    DOB: [birth year], 78 y.o.   MRN: [id number]  HPI Here for refills. He is doing well. He sees Dr. Wilson Goracke and Dr. Shirlee Latch once a year.    Review of Systems  Constitutional: Negative.   Respiratory: Negative.   Cardiovascular: Negative.   Musculoskeletal: Positive for back pain.       Objective:   Physical Exam  Constitutional: He appears well-developed and well-nourished.  Cardiovascular: Normal rate, regular rhythm, normal heart sounds and intact distal pulses.   Pulmonary/Chest: Effort normal and breath sounds normal.          Assessment & Plan:  meds were refilled.

## 2013-12-22 ENCOUNTER — Encounter: Payer: Self-pay | Admitting: Cardiology

## 2014-01-11 ENCOUNTER — Other Ambulatory Visit: Payer: Self-pay | Admitting: Cardiology

## 2014-01-11 ENCOUNTER — Other Ambulatory Visit: Payer: Self-pay | Admitting: Family Medicine

## 2014-02-01 ENCOUNTER — Ambulatory Visit: Payer: Medicare Other | Admitting: Cardiology

## 2014-02-05 ENCOUNTER — Other Ambulatory Visit: Payer: Self-pay | Admitting: Cardiology

## 2014-02-06 ENCOUNTER — Other Ambulatory Visit: Payer: Self-pay | Admitting: *Deleted

## 2014-02-07 ENCOUNTER — Encounter: Payer: Self-pay | Admitting: Cardiology

## 2014-02-07 ENCOUNTER — Ambulatory Visit (INDEPENDENT_AMBULATORY_CARE_PROVIDER_SITE_OTHER): Payer: Medicare Other | Admitting: Cardiology

## 2014-02-07 VITALS — BP 130/72 | HR 70 | Wt 208.0 lb

## 2014-02-07 DIAGNOSIS — E785 Hyperlipidemia, unspecified: Secondary | ICD-10-CM

## 2014-02-07 DIAGNOSIS — I2589 Other forms of chronic ischemic heart disease: Secondary | ICD-10-CM

## 2014-02-07 DIAGNOSIS — I2510 Atherosclerotic heart disease of native coronary artery without angina pectoris: Secondary | ICD-10-CM

## 2014-02-07 DIAGNOSIS — I255 Ischemic cardiomyopathy: Secondary | ICD-10-CM

## 2014-02-07 MED ORDER — LISINOPRIL 10 MG PO TABS: ORAL_TABLET | ORAL | Status: DC

## 2014-02-07 NOTE — Patient Instructions (Signed)
Your physician recommends that you return for lab work in: 2 weeks : BMET, CBC, LIPID,LFT   Your physician has recommended you make the following change in your medication:   1. Increase your Lisinopril to 10 mg daily   Your physician wants you to follow-up in: 1 year with Dr. Kendall Flack will receive a reminder letter in the mail two months in advance. If you don't receive a letter, please call our office to schedule the follow-up appointment.

## 2014-02-08 NOTE — Progress Notes (Signed)
Patient ID: Charles Coffey, male   DOB: [birth year], 79 y.o.   MRN: [id number] PCP: Dr. Sarajane Jews  79 yo with history of CAD s/p MI in 1995 and CABG x 3 in 2002 presents for cardiology followup. Patient reports that his last stress test was in 2008, and says that he was told that it was normal. He is not on a statin as he says that multiple statins have caused him to get flu-like symptoms and elevated LFTs. Zetia caused muscle pain.  He is on Niacin.  He is doing well in general. He walks on a treadmill and lifts weight 3+ days/week. He walks around the neighborhood with his wife and does yardwork like raking. No exertional dyspnea or chest pain. Main complaint is low back pain that has been chronic.  Last echo in 12/12 showed EF 45-50% with apical aneurysm.    ECG: NSR, RBBB, 1st degree AV block, old lateral MI  Labs (10/11): LDL 120, HDL 48, K 4.6, creatinine 1.1, TSH normal  Labs (11/12): K 4.3, creatinine 1.1 Labs (2/13): LDL 109, HDL 44 Las (2/14): K 4.2, creatinine 1.0, LDL 115, HDL 43  Allergies:  1) ! * Anthistamines  2) ! * Statins   Past Medical History:  1. Colonic polyps, hx of  2. Coronary artery disease: MI 1995, CABG x 3 2002 (patient says that he had LIMA and RIMA grafts), last stress in 2008 was normal per patient's report.  3. Hyperlipidemia: Intolerant to statins due to elevated LFTs and flu-like symptoms. Says he has tried multiple statins. Zetia caused muscle pain.  4. Hypertension  5. UTIs  6. IBS  7. restless legs  8. Benign prostatic hypertrophy  9. GERD  10. History of lumbar fusion  11. Ischemic cardiomyopathy: Echo (12/12) with EF 45-50%, apical aneurysm, mild to moderate LAE.   Family History:  Family History of CAD Male 1st degree relative <50  Family History Diabetes 1st degree relative  Family History Hypertension   Social History:  Retired, moved to [place] [employer] from [place]  Married, has children in [place].  Former Smoker, quit around 1980.  Alcohol  use-yes   Review of Systems  All systems reviewed and negative except as per HPI.   Current Outpatient Prescriptions  Medication Sig Dispense Refill  . aspirin 81 MG tablet Take 81 mg by mouth daily.        . carvedilol (COREG) 6.25 MG tablet TAKE 1 TABLET (6.25 MG TOTAL) BY MOUTH 2 (TWO) TIMES DAILY.  60 tablet  0  . etodolac (LODINE) 400 MG tablet       . lisinopril (PRINIVIL,ZESTRIL) 10 MG tablet TAKE 1 TABLET BY MOUTH EVERY DAY  30 tablet  3  . Niacin CR 1000 MG TBCR 2 tablets at bedtime      . pramipexole (MIRAPEX) 1 MG tablet TAKE 1 & 1/2 TABLET BY MOUTH AT BEDTIME  45 tablet  5  . predniSONE (DELTASONE) 20 MG tablet       . traMADol (ULTRAM) 50 MG tablet TAKE 1 TABLET BY MOUTH EVERY 6 HOURS AS NEEDED FOR PAIN  120 tablet  5  . carvedilol (COREG) 6.25 MG tablet TAKE 1 TABLET (6.25 MG TOTAL) BY MOUTH 2 (TWO) TIMES DAILY.  60 tablet  5   No current facility-administered medications for this visit.    BP 130/72  Pulse 70  Wt 94.348 kg (208 lb) General: NAD Neck: No JVD, no thyromegaly or thyroid nodule.  Lungs: Clear to auscultation bilaterally with normal  respiratory effort. CV: Nondisplaced PMI.  Heart regular S1/S2, no S3/S4, no murmur.  No peripheral edema.  No carotid bruit.  Normal pedal pulses.  Abdomen: Soft, nontender, no hepatosplenomegaly, no distention.   Neurologic: Alert and oriented x 3.  Psych: Normal affect. Extremities: No clubbing or cyanosis.   Assessment/Plan:  HYPERLIPIDEMIA  He has not been able to tolerate any statins or Zetia but can take Niacin. Continue current niacin, will check lipids with other labs in 2 wks.  CORONARY ARTERY DISEASE  History of CABG. No ischemic symptoms. Continue ASA 81, ACEI, Coreg.   Ischemic Cardiomyopathy EF 45-50% on echo last year with apical aneurysm.  Continue Coreg, increase lisinopril to 10 mg daily to help limit adverse remodeling. BMET in 2 wks.   Followup in 1 year.   [name] 02/08/2014

## 2014-02-21 ENCOUNTER — Other Ambulatory Visit (INDEPENDENT_AMBULATORY_CARE_PROVIDER_SITE_OTHER): Payer: Medicare Other

## 2014-02-21 DIAGNOSIS — I255 Ischemic cardiomyopathy: Secondary | ICD-10-CM

## 2014-02-21 DIAGNOSIS — I2589 Other forms of chronic ischemic heart disease: Secondary | ICD-10-CM

## 2014-02-21 LAB — CBC WITH DIFFERENTIAL/PLATELET
Basophils Absolute: 0 10*3/uL (ref 0.0–0.1)
Basophils Relative: 0.6 % (ref 0.0–3.0)
EOS ABS: 0.3 10*3/uL (ref 0.0–0.7)
EOS PCT: 5 % (ref 0.0–5.0)
HCT: 38.8 % — ABNORMAL LOW (ref 39.0–52.0)
Hemoglobin: 13 g/dL (ref 13.0–17.0)
Lymphocytes Relative: 36.9 % (ref 12.0–46.0)
Lymphs Abs: 1.9 10*3/uL (ref 0.7–4.0)
MCHC: 33.5 g/dL (ref 30.0–36.0)
MCV: 98.1 fl (ref 78.0–100.0)
Monocytes Absolute: 0.5 10*3/uL (ref 0.1–1.0)
Monocytes Relative: 8.9 % (ref 3.0–12.0)
Neutro Abs: 2.6 10*3/uL (ref 1.4–7.7)
Neutrophils Relative %: 48.6 % (ref 43.0–77.0)
PLATELETS: 143 10*3/uL — AB (ref 150.0–400.0)
RBC: 3.95 Mil/uL — ABNORMAL LOW (ref 4.22–5.81)
RDW: 14.3 % (ref 11.5–14.6)
WBC: 5.3 10*3/uL (ref 4.5–10.5)

## 2014-02-21 LAB — LIPID PANEL
CHOLESTEROL: 168 mg/dL (ref 0–200)
HDL: 42.3 mg/dL (ref >39.00)
LDL CALC: 107 mg/dL — AB (ref 0–99)
Total CHOL/HDL Ratio: 4
Triglycerides: 93 mg/dL (ref 0.0–149.0)
VLDL: 18.6 mg/dL (ref 0.0–40.0)

## 2014-02-21 LAB — BASIC METABOLIC PANEL
BUN: 15 mg/dL (ref 6–23)
CHLORIDE: 108 meq/L (ref 96–112)
CO2: 22 mEq/L (ref 19–32)
Calcium: 9.5 mg/dL (ref 8.4–10.5)
Creatinine, Ser: 1 mg/dL (ref 0.4–1.5)
GFR: 77.04 mL/min (ref >60.00)
Glucose, Bld: 93 mg/dL (ref 70–99)
Potassium: 4.4 mEq/L (ref 3.5–5.1)
Sodium: 136 mEq/L (ref 135–145)

## 2014-02-21 LAB — HEPATIC FUNCTION PANEL
ALT: 21 U/L (ref 0–53)
AST: 23 U/L (ref 0–37)
Albumin: 4.1 g/dL (ref 3.5–5.2)
Alkaline Phosphatase: 49 U/L (ref 39–117)
BILIRUBIN DIRECT: 0.1 mg/dL (ref 0.0–0.3)
TOTAL PROTEIN: 7.1 g/dL (ref 6.0–8.3)
Total Bilirubin: 0.8 mg/dL (ref 0.3–1.2)

## 2014-02-26 ENCOUNTER — Telehealth: Payer: Self-pay | Admitting: Cardiology

## 2014-02-26 ENCOUNTER — Encounter: Payer: Self-pay | Admitting: *Deleted

## 2014-02-26 NOTE — Telephone Encounter (Signed)
Spoke with patient about lab results.

## 2014-02-26 NOTE — Telephone Encounter (Signed)
New message     Returning a nurses call for test results

## 2014-05-14 ENCOUNTER — Other Ambulatory Visit: Payer: Self-pay | Admitting: Family Medicine

## 2014-05-15 NOTE — Telephone Encounter (Signed)
Call in #120 with 5 rf 

## 2014-06-26 ENCOUNTER — Other Ambulatory Visit: Payer: Self-pay | Admitting: *Deleted

## 2014-06-26 MED ORDER — LISINOPRIL 10 MG PO TABS: ORAL_TABLET | ORAL | Status: DC

## 2014-07-03 ENCOUNTER — Ambulatory Visit (INDEPENDENT_AMBULATORY_CARE_PROVIDER_SITE_OTHER): Payer: Medicare Other | Admitting: Family Medicine

## 2014-07-03 ENCOUNTER — Encounter: Payer: Self-pay | Admitting: Family Medicine

## 2014-07-03 VITALS — BP 114/65 | HR 91 | Temp 99.9°F | Ht 68.0 in | Wt 200.0 lb

## 2014-07-03 DIAGNOSIS — K5732 Diverticulitis of large intestine without perforation or abscess without bleeding: Secondary | ICD-10-CM

## 2014-07-03 DIAGNOSIS — R109 Unspecified abdominal pain: Secondary | ICD-10-CM

## 2014-07-03 LAB — POCT URINALYSIS DIPSTICK
Bilirubin, UA: NEGATIVE
Blood, UA: NEGATIVE
GLUCOSE UA: NEGATIVE
Ketones, UA: NEGATIVE
Leukocytes, UA: NEGATIVE
Nitrite, UA: NEGATIVE
SPEC GRAV UA: 1.01
UROBILINOGEN UA: 0.2
pH, UA: 6

## 2014-07-03 MED ORDER — CIPROFLOXACIN HCL 500 MG PO TABS: 500.0000 mg | ORAL_TABLET | Freq: Two times a day (BID) | ORAL | Status: DC

## 2014-07-03 MED ORDER — METRONIDAZOLE 500 MG PO TABS: 500.0000 mg | ORAL_TABLET | Freq: Two times a day (BID) | ORAL | Status: DC

## 2014-07-03 NOTE — Progress Notes (Signed)
Pre visit review using our clinic review tool, if applicable. No additional management support is needed unless otherwise documented below in the visit note. 

## 2014-07-03 NOTE — Progress Notes (Signed)
   Subjective:    Patient ID: Charles Coffey, male    DOB: [birth year], 79 y.o.   MRN: [id number]  HPI Here for the onset yesterday of lower abdominal cramps, diarrhea, and fever. No nausea or vomiting. He and his wife just came back from a trip to [place], and so far she feels fine.    Review of Systems  Constitutional: Positive for fever.  Gastrointestinal: Positive for abdominal pain and diarrhea. Negative for nausea, vomiting, constipation, blood in stool, abdominal distention, anal bleeding and rectal pain.  Genitourinary: Negative.        Objective:   Physical Exam  Constitutional: He appears well-developed and well-nourished. No distress.  Cardiovascular: Normal rate, regular rhythm, normal heart sounds and intact distal pulses.   Pulmonary/Chest: Effort normal and breath sounds normal.  Abdominal: Soft. Bowel sounds are normal. He exhibits no distension and no mass. There is no rebound and no guarding.  Mildly tender in the left flank and LLQ          Assessment & Plan:  Treat with Cipro and Flagyl. Eat a bland diet such as rice and bananas and advance as tolerated.

## 2014-07-04 ENCOUNTER — Encounter: Payer: Self-pay | Admitting: Family Medicine

## 2014-07-11 ENCOUNTER — Telehealth: Payer: Self-pay | Admitting: Family Medicine

## 2014-07-11 DIAGNOSIS — R197 Diarrhea, unspecified: Secondary | ICD-10-CM

## 2014-07-11 NOTE — Telephone Encounter (Signed)
I spoke with pt  

## 2014-07-11 NOTE — Telephone Encounter (Signed)
Pt called said he not doing well still having black stool and back pain. Pt is requesting a referral to GI

## 2014-07-11 NOTE — Telephone Encounter (Signed)
Referral was done  

## 2014-07-12 ENCOUNTER — Encounter: Payer: Self-pay | Admitting: Internal Medicine

## 2014-07-19 ENCOUNTER — Other Ambulatory Visit: Payer: Self-pay | Admitting: Family Medicine

## 2014-08-15 ENCOUNTER — Other Ambulatory Visit: Payer: Self-pay | Admitting: Cardiology

## 2014-09-20 ENCOUNTER — Ambulatory Visit (INDEPENDENT_AMBULATORY_CARE_PROVIDER_SITE_OTHER): Payer: Medicare Other | Admitting: Internal Medicine

## 2014-09-20 ENCOUNTER — Encounter: Payer: Self-pay | Admitting: Internal Medicine

## 2014-09-20 ENCOUNTER — Other Ambulatory Visit: Payer: Self-pay | Admitting: Family Medicine

## 2014-09-20 VITALS — BP 104/72 | HR 68 | Ht 67.75 in | Wt 202.2 lb

## 2014-09-20 DIAGNOSIS — Z86010 Personal history of colonic polyps: Secondary | ICD-10-CM

## 2014-09-20 DIAGNOSIS — Z8719 Personal history of other diseases of the digestive system: Secondary | ICD-10-CM

## 2014-09-20 DIAGNOSIS — K589 Irritable bowel syndrome without diarrhea: Secondary | ICD-10-CM

## 2014-09-20 DIAGNOSIS — K5900 Constipation, unspecified: Secondary | ICD-10-CM

## 2014-09-20 MED ORDER — MOVIPREP 100 G PO SOLR: 1.0000 | Freq: Once | ORAL | Status: DC

## 2014-09-20 MED ORDER — LINACLOTIDE 145 MCG PO CAPS: 145.0000 ug | ORAL_CAPSULE | Freq: Every day | ORAL | Status: DC

## 2014-09-20 NOTE — Progress Notes (Signed)
Patient ID: Charles Coffey, male   DOB: 08-26-1935, 79 y.o.   MRN: [id number] HPI: Charles Coffey is a 79 year old male with past medical history of COPD, hypertension, hyperlipidemia, GERD, colon polyps and IBS who is seen to consider repeat screening colonoscopy after recent diverticulitis. He reports in July he developed lower right-sided abdominal pain and was seen by primary care. He was diagnosed with diverticulitis and treated with Cipro and Flagyl. Symptoms resolved after a few days he completed antibiotics. He's had no recurrent abdominal pain. He does report a history of irritable bowel with constipation predominance. He specifically asks about Linzess and would like to try it. He is currently using a laxative tea which helps but he often feels incomplete evacuation. Rectal bleeding or melena. No upper GI complaints including dysphagia or diet dysphagia. Early satiety. Appetite and no weight loss. No hepatobiliary complaints  Reportedly last colonoscopy was 5 years ago performed in [place]. He recalls a small polyp which was removed and 5 year repeat recommended  Past Medical History  Diagnosis Date  . Hypertension   . Hyperlipidemia   . CAD (coronary artery disease)     sees Dr. [name]   . BPH with urinary obstruction   . Restless legs syndrome   . Myocardial infarction   . GERD (gastroesophageal reflux disease)   . Colon polyps   . Chronic low back pain   . IBS (irritable bowel syndrome)     Past Surgical History  Procedure Laterality Date  . Prostate surgery  06-27-12    per Dr. Roni Bread, had CTT  . Colonoscopy  2010    in [place], polyps, repeat in 5 yrs   . Lumbar fusion  2003    L3-L4  . Tonsillectomy    . Heart bypass      Outpatient Prescriptions Prior to Visit  Medication Sig Dispense Refill  . aspirin 81 MG tablet Take 81 mg by mouth daily.        . carvedilol (COREG) 6.25 MG tablet TAKE 1 TABLET (6.25 MG TOTAL) BY MOUTH 2 (TWO) TIMES DAILY.  60 tablet  5  .  lisinopril (PRINIVIL,ZESTRIL) 10 MG tablet TAKE 1 TABLET BY MOUTH EVERY DAY  30 tablet  5  . Niacin CR 1000 MG TBCR 2 tablets at bedtime      . pramipexole (MIRAPEX) 1 MG tablet TAKE 1 & 1/2 TABLET BY MOUTH AT BEDTIME  45 tablet  5  . traMADol (ULTRAM) 50 MG tablet TAKE 1 TABLET EVERY 6 HOURS AS NEEDED FOR PAIN  120 tablet  5  . carvedilol (COREG) 6.25 MG tablet TAKE 1 TABLET (6.25 MG TOTAL) BY MOUTH 2 (TWO) TIMES DAILY.  60 tablet  0  . carvedilol (COREG) 6.25 MG tablet TAKE 1 TABLET (6.25 MG TOTAL) BY MOUTH 2 (TWO) TIMES DAILY.  60 tablet  1  . ciprofloxacin (CIPRO) 500 MG tablet TAKE 1 TABLET (500 MG TOTAL) BY MOUTH 2 (TWO) TIMES DAILY.  20 tablet  0  . etodolac (LODINE) 400 MG tablet       . metroNIDAZOLE (FLAGYL) 500 MG tablet TAKE 1 TABLET (500 MG TOTAL) BY MOUTH 2 (TWO) TIMES DAILY.  20 tablet  0  . predniSONE (DELTASONE) 20 MG tablet        No facility-administered medications prior to visit.    Allergies  Allergen Reactions  . Antihistamines, Loratadine-Type   . Statins     REACTION: liver effects    Family History  Problem Relation Age  of Onset  . Heart disease Mother   . Heart disease Maternal Uncle     x 2  . Anuerysm Father     femoral artery    History  Substance Use Topics  . Smoking status: Former Smoker    Types: Cigarettes    Quit date: 12/28/1978  . Smokeless tobacco: Never Used  . Alcohol Use: Yes     Comment: occ-1-2 per week    ROS: As per history of present illness, otherwise negative  BP 104/72  Pulse 68  Ht 5' 7.75" (1.721 m)  Wt 202 lb 4 oz (91.74 kg)  BMI 30.97 kg/m2 Constitutional: Well-developed and well-nourished. No distress. HEENT: Normocephalic and atraumatic. Oropharynx is clear and moist. No oropharyngeal exudate. Conjunctivae are normal.  No scleral icterus. Neck: Neck supple. Trachea midline. Cardiovascular: Normal rate, regular rhythm and intact distal pulses.  Pulmonary/chest: Effort normal and breath sounds normal. No  wheezing, rales or rhonchi. Abdominal: Soft, nontender, nondistended. Bowel sounds active throughout. Extremities: no clubbing, cyanosis, or edema Neurological: Alert and oriented to person place and time. Skin: Skin is warm and dry. No rashes noted. Psychiatric: Normal mood and affect. Behavior is normal.  RELEVANT LABS AND IMAGING: CBC    Component Value Date/Time   WBC 5.3 02/21/2014 1047   RBC 3.95* 02/21/2014 1047   HGB 13.0 02/21/2014 1047   HCT 38.8* 02/21/2014 1047   PLT 143.0* 02/21/2014 1047   MCV 98.1 02/21/2014 1047   MCHC 33.5 02/21/2014 1047   RDW 14.3 02/21/2014 1047   LYMPHSABS 1.9 02/21/2014 1047   MONOABS 0.5 02/21/2014 1047   EOSABS 0.3 02/21/2014 1047   BASOSABS 0.0 02/21/2014 1047    CMP     Component Value Date/Time   NA 136 02/21/2014 1047   K 4.4 02/21/2014 1047   CL 108 02/21/2014 1047   CO2 22 02/21/2014 1047   GLUCOSE 93 02/21/2014 1047   BUN 15 02/21/2014 1047   CREATININE 1.0 02/21/2014 1047   CALCIUM 9.5 02/21/2014 1047   PROT 7.1 02/21/2014 1047   ALBUMIN 4.1 02/21/2014 1047   AST 23 02/21/2014 1047   ALT 21 02/21/2014 1047   ALKPHOS 49 02/21/2014 1047   BILITOT 0.8 02/21/2014 1047   GFRNONAA 73.48 10/15/2010 1403    ASSESSMENT/PLAN: 79 year old male with past medical history of COPD, hypertension, hyperlipidemia, GERD, colon polyps and IBS who is seen to consider repeat screening colonoscopy after recent diverticulitis.  1. Hx of colon polyps/presumed, now resolved diverticulitis -- he is due repeat screening/surveillance colonoscopy at this time. He was treated empirically for persistent diverticulitis 2 months ago and symptoms resolved. Colonoscopy will also help to ensure no other cause of inflammation/pain. We discussed colonoscopy including the risks and benefits and he is agreeable to proceed  2. IBS with constipation predominance -- long-standing history. Some lower cramping and constipation. Linzess is a reasonable medication to try. We will try 145 mcg  daily. We discussed the side effects, specifically diarrhea. Should this occur I asked her to notify me and he voices understanding.

## 2014-09-20 NOTE — Patient Instructions (Signed)
You have been scheduled for a colonoscopy. Please follow written instructions given to you at your visit today.  Please pick up your prep kit at the pharmacy within the next 1-3 days. If you use inhalers (even only as needed), please bring them with you on the day of your procedure. Your physician has requested that you go to www.startemmi.com and enter the access code given to you at your visit today. This web site gives a general overview about your procedure. However, you should still follow specific instructions given to you by our office regarding your preparation for the procedure. We have sent  medications to your pharmacy for you to pick up at your convenience. CC:  Alysia Penna MD

## 2014-09-21 ENCOUNTER — Telehealth: Payer: Self-pay | Admitting: Cardiology

## 2014-09-21 ENCOUNTER — Telehealth: Payer: Self-pay | Admitting: Physician Assistant
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
## 2014-09-21 NOTE — Telephone Encounter (Signed)
ROI faxed to [facility] @ [phone]/km

## 2014-09-21 NOTE — Progress Notes (Signed)
Patient ID: Charles Coffey, male   DOB: [birth year], 79 y.o.   MRN: [id number]   [facility] called to let us know patient's Prior Authorization for Linzess is denied. Patient has to try Miralax and Lactulose before Linzess can be considered for approval. They will send a letter to patient to inform him of this decision.

## 2014-09-21 NOTE — Telephone Encounter (Signed)
Medical records is helping him obtain records from recent hospitalization in [place].  He is aware that it is important that we have his medical records for  appt 09/24/14.
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
## 2014-09-24 ENCOUNTER — Ambulatory Visit (INDEPENDENT_AMBULATORY_CARE_PROVIDER_SITE_OTHER): Payer: Medicare Other | Admitting: Physician Assistant

## 2014-09-24 ENCOUNTER — Encounter: Payer: Self-pay | Admitting: Physician Assistant

## 2014-09-24 ENCOUNTER — Ambulatory Visit: Payer: Medicare Other | Admitting: Physician Assistant

## 2014-09-24 VITALS — BP 122/72 | HR 62 | Ht 68.0 in | Wt 200.0 lb

## 2014-09-24 DIAGNOSIS — I2510 Atherosclerotic heart disease of native coronary artery without angina pectoris: Secondary | ICD-10-CM

## 2014-09-24 DIAGNOSIS — I10 Essential (primary) hypertension: Secondary | ICD-10-CM

## 2014-09-24 DIAGNOSIS — R55 Syncope and collapse: Secondary | ICD-10-CM | POA: Insufficient documentation

## 2014-09-24 NOTE — Patient Instructions (Addendum)
Your physician recommends that you continue on your current medications as directed. Please refer to the Current Medication list given to you today.    Your physician has requested that you have en exercise stress myoview. For further information please visit [email or web address]. Please follow instruction sheet, as given    Your physician has recommended that you wear an event monitor. Event monitors are medical devices that record the heart's electrical activity. Doctors most often [place] these monitors to diagnose arrhythmias. Arrhythmias are problems with the speed or rhythm of the heartbeat. The monitor is a small, portable device. You can wear one while you do your normal daily activities. This is usually used to diagnose what is causing palpitations/syncope (passing out).  Your physician recommends that you schedule a follow-up appointment with Dr Aundra Dubin in one month    NO DRIVING FOR 6 MONTHS PER DR MCLEAN/PA MICHELE LENZE

## 2014-09-24 NOTE — Assessment & Plan Note (Signed)
History of CABG in 2002 last stress test in 2008. We'll schedule stress Myoview
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
## 2014-09-24 NOTE — Assessment & Plan Note (Signed)
Blood pressure stable ? ?
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
## 2014-09-26 ENCOUNTER — Other Ambulatory Visit: Payer: Self-pay | Admitting: Neurosurgery

## 2014-09-26 ENCOUNTER — Telehealth: Payer: Self-pay | Admitting: Internal Medicine

## 2014-09-26 DIAGNOSIS — R55 Syncope and collapse: Secondary | ICD-10-CM

## 2014-09-26 NOTE — Telephone Encounter (Signed)
I have advised patient that a prior auth for linzess was attempted on 09/20/14. However, insurance will not cover linzess unless he has tried other medication. We have seen him in the office only once and have limited information of what he previously tried. He states that he will look through his records at home to find out which medications he has taken and will be in contact if he finds them.

## 2014-09-27 ENCOUNTER — Other Ambulatory Visit: Payer: Self-pay | Admitting: Neurosurgery

## 2014-09-27 DIAGNOSIS — R55 Syncope and collapse: Secondary | ICD-10-CM

## 2014-10-05 ENCOUNTER — Encounter: Payer: Self-pay | Admitting: Internal Medicine

## 2014-10-08 ENCOUNTER — Encounter: Payer: Self-pay | Admitting: *Deleted

## 2014-10-08 ENCOUNTER — Ambulatory Visit (HOSPITAL_COMMUNITY): Payer: Medicare Other | Attending: Cardiology | Admitting: Radiology

## 2014-10-08 ENCOUNTER — Encounter (INDEPENDENT_AMBULATORY_CARE_PROVIDER_SITE_OTHER): Payer: Medicare Other

## 2014-10-08 VITALS — BP 138/76 | HR 51 | Ht 68.0 in | Wt 200.0 lb

## 2014-10-08 DIAGNOSIS — R55 Syncope and collapse: Secondary | ICD-10-CM

## 2014-10-08 DIAGNOSIS — I4510 Unspecified right bundle-branch block: Secondary | ICD-10-CM | POA: Insufficient documentation

## 2014-10-08 DIAGNOSIS — I10 Essential (primary) hypertension: Secondary | ICD-10-CM | POA: Diagnosis not present

## 2014-10-08 DIAGNOSIS — I2510 Atherosclerotic heart disease of native coronary artery without angina pectoris: Secondary | ICD-10-CM | POA: Diagnosis not present

## 2014-10-08 MED ORDER — TECHNETIUM TC 99M SESTAMIBI GENERIC - CARDIOLITE
10.0000 | Freq: Once | INTRAVENOUS | Status: AC | PRN
  Administered 2014-10-08: 10 via INTRAVENOUS

## 2014-10-08 MED ORDER — TECHNETIUM TC 99M SESTAMIBI GENERIC - CARDIOLITE
30.0000 | Freq: Once | INTRAVENOUS | Status: AC | PRN
  Administered 2014-10-08: 30 via INTRAVENOUS

## 2014-10-08 NOTE — Progress Notes (Signed)
Patient ID: Charles Coffey, male   DOB: 18-Jul-1935, 79 y.o.   MRN: [id number] [employer] verite 30 day cardiac event monitor applied to patient.

## 2014-10-08 NOTE — Progress Notes (Addendum)
Charles Coffey [address] [place], [place] [address] [phone]    Cardiology Nuclear Med Study  [name] is a 79 y.o. male     MRN : [id number]     DOB: [birth year]  Procedure Date: 10/08/2014  Nuclear Med Background Indication for Stress Test:  Evaluation for Ischemia, Graft Patency and Stent Patency History:  CAD, MPI 2008 (normal per pt - in [place]) Cardiac Risk Factors: Hypertension and RBBB  Symptoms:  Syncope   Nuclear Pre-Procedure Caffeine/Decaff Intake:  None> 12 hrs NPO After: 5:00pm   Lungs:  clear O2 Sat: 94% on room air. IV 0.9% NS with Angio Cath:  22g  IV Site: R Antecubital x 1, tolerated well IV Started by:  Irven Baltimore, RN  Chest Size (in):  42 Cup Size: n/a  Height: 5\' 8"  (1.727 m)  Weight:  200 lb (90.719 kg)  BMI:  Body mass index is 30.42 kg/(m^2). Tech Comments:  Patient took last dose of Coreg at 5:00pm yesterday. Irven Baltimore, RN.    Nuclear Med Study 1 or 2 day study: 1 day  Stress Test Type:  Stress  Reading MD: N/A  Order Authorizing Provider:  [name], MD, and Ermalinda Barrios, [facility]  Resting Radionuclide: Technetium 55m Sestamibi  Resting Radionuclide Dose: 11.0 mCi   Stress Radionuclide:  Technetium 38m Sestamibi  Stress Radionuclide Dose: 33.0 mCi           Stress Protocol Rest HR: 51 Stress HR: 129  Rest BP: 138/76 Stress BP: 162/76  Exercise Time (min): 8:00 METS: 10.1           Dose of Adenosine (mg):  n/a Dose of Lexiscan: n/a mg  Dose of Atropine (mg): n/a Dose of Dobutamine: n/a mcg/kg/min (at max HR)  Stress Test Technologist: Glade Lloyd, BS-ES  Nuclear Technologist:  Earl Many, CNMT     Rest Procedure:  Myocardial perfusion imaging was performed at rest 45 minutes following the intravenous administration of Technetium 93m Sestamibi. Rest ECG: NSR-RBBB  Stress Procedure:  The patient exercised on the treadmill utilizing the Bruce Protocol for 8:00 minutes. The patient  stopped due to fatigue and denied any chest pain.  Technetium 82m Sestamibi was injected at peak exercise and myocardial perfusion imaging was performed after a brief delay. Stress ECG: No significant ST segment change suggestive of ischemia.  QPS Raw Data Images:  Normal; no motion artifact; normal heart/lung ratio. Stress Images:  Fixed anteroapical defect Rest Images:  Fixed anteroapical defect Subtraction (SDS):  No evidence of ischemia. Transient Ischemic Dilatation (Normal <1.22):  1.01 Lung/Heart Ratio (Normal <0.45):  0.44  Quantitative Gated Spect Images QGS EDV:  134 ml QGS ESV:  70 ml  Impression Exercise Capacity:  Good exercise capacity. BP Response:  Normal blood pressure response. Clinical Symptoms:  No significant symptoms noted. ECG Impression:  There are scattered PVCs. Comparison with Prior Nuclear Study: No previous nuclear study performed  Overall Impression:  Intermediate risk stress nuclear study with medium-size, severe intensity fixed distal anterior, apical septal and apical defect consistent with scar.  LV Ejection Fraction: 48%.  LV Wall Motion:  distal anterior and apical akinesis.  Pixie Casino, MD, [facility] Board Certified in Nuclear Cardiology Attending Cardiologist [place]  Scar consistent with prior MI, consistent with wall motion abnormalities on prior echo.  EF 48%, this is stable.  No evidence for ischemia.  Do not think he needs cath.   [name] 10/09/2014 4:00 PM

## 2014-10-09 NOTE — Progress Notes (Signed)
Attempted to reach the pt at number listed in chart but message states that this number has been disconnected or is no longer in service.

## 2014-10-09 NOTE — Progress Notes (Signed)
Message sent to the pt through My Chart: Charles Coffey,   The phone number we have on file in your chart is not a correct number (message said phone has been disconnected or is no longer in service).  Please contact our office at [phone] to discuss the results of your stress test.  Lauren RN

## 2014-10-10 NOTE — Progress Notes (Signed)
Pt.notified

## 2014-10-10 NOTE — Progress Notes (Signed)
LMTCB for pt with pt's wife.

## 2014-10-17 ENCOUNTER — Encounter: Payer: Self-pay | Admitting: Internal Medicine

## 2014-10-17 ENCOUNTER — Ambulatory Visit (AMBULATORY_SURGERY_CENTER): Payer: Medicare Other | Admitting: Internal Medicine

## 2014-10-17 VITALS — BP 115/73 | HR 55 | Temp 97.6°F | Resp 13 | Ht 67.75 in | Wt 202.0 lb

## 2014-10-17 DIAGNOSIS — D123 Benign neoplasm of transverse colon: Secondary | ICD-10-CM

## 2014-10-17 DIAGNOSIS — D122 Benign neoplasm of ascending colon: Secondary | ICD-10-CM

## 2014-10-17 DIAGNOSIS — Z86010 Personal history of colonic polyps: Secondary | ICD-10-CM

## 2014-10-17 HISTORY — PX: COLONOSCOPY: SHX174

## 2014-10-17 MED ORDER — SODIUM CHLORIDE 0.9 % IV SOLN: 500.0000 mL | INTRAVENOUS | Status: DC

## 2014-10-17 MED ORDER — LUBIPROSTONE 8 MCG PO CAPS: 8.0000 ug | ORAL_CAPSULE | Freq: Two times a day (BID) | ORAL | Status: DC

## 2014-10-17 NOTE — Op Note (Signed)
[place]  [address] [place], [address]   COLONOSCOPY PROCEDURE REPORT  PATIENT: Charles Coffey, Charles Coffey  MR#: [id number] BIRTHDATE: 1935/11/14 , 79  yrs. old GENDER: male ENDOSCOPIST: Jerene Bears, MD PROCEDURE DATE:  10/17/2014 PROCEDURE:   Colonoscopy with snare polypectomy and Colonoscopy with cold biopsy polypectomy First Screening Colonoscopy - Avg.  risk and is 50 yrs.  old or older - No.  Prior Negative Screening - Now for repeat screening. N/A  History of Adenoma - Now for follow-up colonoscopy & has been > or = to 3 yrs.  Yes hx of adenoma.  Has been 3 or more years since last colonoscopy.  Polyps Removed Today? Yes. ASA CLASS:   Class III INDICATIONS:high risk personal history of colonic polyps and follow up for previously diagnosed diverticulitis. MEDICATIONS: Propofol 260 mg IV and lidocaine 40 mg IV and Monitored anesthesia care  DESCRIPTION OF PROCEDURE:   After the risks benefits and alternatives of the procedure were thoroughly explained, informed consent was obtained.  The digital rectal exam revealed no rectal mass.   The LB [id number]  endoscope was introduced through the anus and advanced to the cecum, which was identified by both the appendix and ileocecal valve. No adverse events experienced. The quality of the prep was good, using MoviPrep  The instrument was then slowly withdrawn as the colon was fully examined.   COLON FINDINGS: Five sessile polyps ranging from 2 to 15mm in size were found in the ascending colon (2) and transverse colon (3). Polypectomies were performed with a cold snare (3) and with cold forceps (2).  The resection was complete, the polyp tissue was completely retrieved and sent to histology.   There was mild diverticulosis noted in the sigmoid colon.   Melanosis coli was found throughout the entire examined colon.  Retroflexed views revealed no abnormalities. The time to cecum=3 minutes 44 seconds. Withdrawal  time=20 minutes 22 seconds.  The scope was withdrawn and the procedure completed. COMPLICATIONS: There were no immediate complications.  ENDOSCOPIC IMPRESSION: 1.   Five sessile polyps ranging from 2 to 41mm in size were found in the ascending colon and transverse colon; polypectomies were performed with a cold snare and with cold forceps 2.   Mild diverticulosis was noted in the sigmoid colon 3.   Melanosis coli was found throughout the entire examined colon  RECOMMENDATIONS: 1.  Await pathology results 2.  High fiber diet 3.  Trial of Linzess or Amitiza, then office followup in 8-12 weeks  eSigned:  Jerene Bears, MD 10/17/2014 5:17 PM   cc:  The Patient, Dr. Sarajane Jews

## 2014-10-17 NOTE — Progress Notes (Signed)
Called to room to assist during endoscopic procedure.  Patient ID and intended procedure confirmed with present staff. Received instructions for my participation in the procedure from the performing physician.  

## 2014-10-17 NOTE — Patient Instructions (Signed)
YOU HAD AN ENDOSCOPIC PROCEDURE TODAY AT THE [facility]: Refer to the procedure report that was given to you for any specific questions about what was found during the examination.  If the procedure report does not answer your questions, please call your gastroenterologist to clarify.  If you requested that your care partner not be given the details of your procedure findings, then the procedure report has been included in a sealed envelope for you to review at your convenience later.  YOU SHOULD EXPECT: Some feelings of bloating in the abdomen. Passage of more gas than usual.  Walking can help get rid of the air that was put into your GI tract during the procedure and reduce the bloating. If you had a lower endoscopy (such as a colonoscopy or flexible sigmoidoscopy) you may notice spotting of blood in your stool or on the toilet paper. If you underwent a bowel prep for your procedure, then you may not have a normal bowel movement for a few days.  DIET: Your first meal following the procedure should be a light meal and then it is ok to progress to your normal diet.  A half-sandwich or bowl of soup is an example of a good first meal.  Heavy or fried foods are harder to digest and may make you feel nauseous or bloated.  Likewise meals heavy in dairy and vegetables can cause extra gas to form and this can also increase the bloating.  Drink plenty of fluids but you should avoid alcoholic beverages for 24 hours.  ACTIVITY: Your care partner should take you home directly after the procedure.  You should plan to take it easy, moving slowly for the rest of the day.  You can resume normal activity the day after the procedure however you should NOT DRIVE or use heavy machinery for 24 hours (because of the sedation medicines used during the test).    SYMPTOMS TO REPORT IMMEDIATELY: A gastroenterologist can be reached at any hour.  During normal business hours, 8:30 AM to 5:00 PM Monday through Friday,  call [phone].  After hours and on weekends, please call the GI answering service at [phone] who will take a message and have the physician on call contact you.   Following lower endoscopy (colonoscopy or flexible sigmoidoscopy):  Excessive amounts of blood in the stool  Significant tenderness or worsening of abdominal pains  Swelling of the abdomen that is new, acute  Fever of 100F or higher    FOLLOW UP: If any biopsies were taken you will be contacted by phone or by letter within the next 1-3 weeks.  Call your gastroenterologist if you have not heard about the biopsies in 3 weeks.  Our staff will call the home number listed on your records the next business day following your procedure to check on you and address any questions or concerns that you may have at that time regarding the information given to you following your procedure. This is a courtesy call and so if there is no answer at the home number and we have not heard from you through the emergency physician on call, we will assume that you have returned to your regular daily activities without incident.  SIGNATURES/CONFIDENTIALITY: You and/or your care partner have signed paperwork which will be entered into your electronic medical record.  These signatures attest to the fact that that the information above on your After Visit Summary has been reviewed and is understood.  Full responsibility of the confidentiality   of this discharge information lies with you and/or your care-partner.  Polyp, diverticulosis, and high fiber diet information given.  Amitiza as instructed.  Followup in 8-12 weeks, call office for appointment.

## 2014-10-17 NOTE — Progress Notes (Signed)
Stable to RR 

## 2014-10-18 ENCOUNTER — Telehealth: Payer: Self-pay

## 2014-10-18 ENCOUNTER — Encounter: Payer: Self-pay | Admitting: *Deleted

## 2014-10-18 NOTE — Telephone Encounter (Signed)
  Follow up Call-  Call back number 10/17/2014  Post procedure Call Back phone  # [phone]  Permission to leave phone message Yes     Patient questions:  Do you have a fever, pain , or abdominal swelling? No. Pain Score  0 *  Have you tolerated food without any problems? Yes.    Have you been able to return to your normal activities? Yes.    Do you have any questions about your discharge instructions: Diet   No. Medications  No. Follow up visit  No.  Do you have questions or concerns about your Care? No.  Actions: * If pain score is 4 or above: No action needed, pain <4.  The pt was not available.  I spoke with his wife and she said he did fine.  I asked her to let him know we called and to return our call if he has any questions or concerns. maw

## 2014-10-19 ENCOUNTER — Other Ambulatory Visit: Payer: Medicare Other

## 2014-10-19 ENCOUNTER — Telehealth: Payer: Self-pay | Admitting: Internal Medicine

## 2014-10-19 ENCOUNTER — Ambulatory Visit
Admission: RE | Admit: 2014-10-19 | Discharge: 2014-10-19 | Disposition: A | Discharge status: Home / Self Care | Payer: Medicare Other | Source: Ambulatory Visit | Attending: Neurosurgery | Admitting: Neurosurgery

## 2014-10-19 DIAGNOSIS — R55 Syncope and collapse: Secondary | ICD-10-CM

## 2014-10-19 MED ORDER — GADOBENATE DIMEGLUMINE 529 MG/ML IV SOLN
20.0000 mL | Freq: Once | INTRAVENOUS | Status: AC | PRN
  Administered 2014-10-19: 20 mL via INTRAVENOUS

## 2014-10-19 NOTE — Telephone Encounter (Signed)
Noted  

## 2014-10-23 ENCOUNTER — Encounter: Payer: Self-pay | Admitting: Internal Medicine

## 2014-10-24 ENCOUNTER — Other Ambulatory Visit: Payer: Self-pay | Admitting: Cardiology

## 2014-10-25 ENCOUNTER — Ambulatory Visit: Payer: Medicare Other | Admitting: Cardiology

## 2014-10-30 ENCOUNTER — Encounter: Payer: Self-pay | Admitting: *Deleted

## 2014-11-01 ENCOUNTER — Ambulatory Visit (INDEPENDENT_AMBULATORY_CARE_PROVIDER_SITE_OTHER): Payer: Medicare Other | Admitting: Cardiology

## 2014-11-01 ENCOUNTER — Encounter: Payer: Self-pay | Admitting: Cardiology

## 2014-11-01 VITALS — BP 124/60 | HR 68 | Ht 67.75 in | Wt 202.0 lb

## 2014-11-01 DIAGNOSIS — R0683 Snoring: Secondary | ICD-10-CM

## 2014-11-01 DIAGNOSIS — G4710 Hypersomnia, unspecified: Secondary | ICD-10-CM

## 2014-11-01 DIAGNOSIS — R400 Somnolence: Secondary | ICD-10-CM

## 2014-11-01 DIAGNOSIS — L719 Rosacea, unspecified: Secondary | ICD-10-CM | POA: Insufficient documentation

## 2014-11-01 DIAGNOSIS — G2581 Restless legs syndrome: Secondary | ICD-10-CM | POA: Insufficient documentation

## 2014-11-01 DIAGNOSIS — R55 Syncope and collapse: Secondary | ICD-10-CM

## 2014-11-01 DIAGNOSIS — Z87438 Personal history of other diseases of male genital organs: Secondary | ICD-10-CM | POA: Insufficient documentation

## 2014-11-01 DIAGNOSIS — K219 Gastro-esophageal reflux disease without esophagitis: Secondary | ICD-10-CM | POA: Insufficient documentation

## 2014-11-01 DIAGNOSIS — I255 Ischemic cardiomyopathy: Secondary | ICD-10-CM

## 2014-11-01 DIAGNOSIS — I2510 Atherosclerotic heart disease of native coronary artery without angina pectoris: Secondary | ICD-10-CM | POA: Insufficient documentation

## 2014-11-01 NOTE — Patient Instructions (Signed)
Your physician recommends that you continue on your current medications as directed. Please refer to the Current Medication list given to you today. Your physician has recommended that you have a sleep study. This test records several body functions during sleep, including: brain activity, eye movement, oxygen and carbon dioxide blood levels, heart rate and rhythm, breathing rate and rhythm, the flow of air through your mouth and nose, snoring, body muscle movements, and chest and belly movement.  Dr. Aundra Dubin is referring you to the [facility] ([facility] One of the pharmacists will contact you to schedule an appointment.  Your physician recommends that you schedule a follow-up appointment in: 4 months with Dr. Aundra Dubin.
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
## 2014-11-06 ENCOUNTER — Ambulatory Visit (INDEPENDENT_AMBULATORY_CARE_PROVIDER_SITE_OTHER): Payer: Medicare Other | Admitting: Pharmacist Clinician (PhC)/ Clinical Pharmacy Specialist

## 2014-11-06 VITALS — Ht 67.0 in | Wt 201.5 lb

## 2014-11-06 DIAGNOSIS — E785 Hyperlipidemia, unspecified: Secondary | ICD-10-CM

## 2014-11-06 NOTE — Patient Instructions (Signed)
Continue with current medications.  Try to keep diet low in carbohydrates (breads, pastas, rice, potatoes) and eggs, but high in vegetables and fiber.  Go to lab on December 17 at 8:50 am for lipid labs  We will call to schedule you an appointment after the first of the year to consider starting the new cholesterol medications (Repatha or Praluent)

## 2014-11-07 ENCOUNTER — Encounter: Payer: Self-pay | Admitting: Pharmacist Clinician (PhC)/ Clinical Pharmacy Specialist

## 2014-11-07 NOTE — Assessment & Plan Note (Addendum)
Mr Tamer has an LDL goal of 70, last recorded LDL was 9 months ago at 107.  Will have him repeat labs in about 6 weeks to see what current values are.  Although he is not at goal for LDL, I am not sure he is a good candidate for a PCSK-9 inhibitor at this time.  He will be switching insurance come January, so we would not do anything before then, but with the costs so high for this, as well as the fact that he has no CAD events in the past 12 years, I would recommend for now that we continue with his current regimen.  If his recent syncopal episodes are due in part to blocked arteries we could re-consider applying for one of these medications.  His cholesterol labs have been stable for the past several years, with LDL running from 107-120 and all other lipid levels holding steady as well.   Once his labs become available in December, we can call to arrange a repeat visit should we decide to move forward.

## 2014-11-07 NOTE — Progress Notes (Signed)
     11/07/2014 Charles Coffey [birth year] [id number]   HPI:  Charles Coffey is a 79 y.o. male patient of Dr Aundra Dubin, with a [place] below who presents today for a lipid clinic evaluation.  His cardiac history is significant for MI in 1995 and CABG x 3 in 2002.  He has been unable to tolerate statins, as they caused an increase in LFTs.  These events were all when he lived in [place] and should be noted in any historical records we have.  Zetia caused him to have myalgias.  He currently takes Niacin, which he tolerates well, and CholestOff, an OTC product containing plant sterols and stanols.    His family history includes 2 maternal uncles who both died young from MI's (73 and 52).  His mother had multiple MI's but lived to the age of 59.  His father died at 44 from an aneurysm and 1 brother had CABG while in his 23s.   He has 2 sons who have no known cardiac disease.    [name] is unable to exercise due to back problems, but he does watch his diet and tries not to eat too many carbs.  He has concerns about the costs of medications, as he is taking Amitiza.  Also he plans to start a new insurance plan as of January 2016.  Labs: 01/2014 - TC 168, TG 93, HDL 42.3, LDL 107, non HDL 125.7 01/2013 - TC 173, TG 72, HDL 43.2, LDL 115, non HDL 129.8    Current Outpatient Prescriptions  Medication Sig Dispense Refill  . aspirin 81 MG tablet Take 81 mg by mouth daily.      . carvedilol (COREG) 6.25 MG tablet TAKE 1 TABLET (6.25 MG TOTAL) BY MOUTH 2 (TWO) TIMES DAILY. 60 tablet 1  . lisinopril (PRINIVIL,ZESTRIL) 10 MG tablet TAKE 1 TABLET BY MOUTH EVERY DAY 30 tablet 5  . lubiprostone (AMITIZA) 8 MCG capsule Take 1 capsule (8 mcg total) by mouth 2 (two) times daily with a meal. 60 capsule 5  . Niacin CR 1000 MG TBCR 2 tablets at bedtime    . pramipexole (MIRAPEX) 1 MG tablet TAKE 1 AND 1/2 TABLETS BY MOUTH AT BEDTIME 45 tablet 5  . traMADol (ULTRAM) 50 MG tablet TAKE 1 TABLET EVERY 6 HOURS AS NEEDED FOR  PAIN 120 tablet 5   No current facility-administered medications for this visit.    Allergies  Allergen Reactions  . Antihistamines, Loratadine-Type   . Statins     REACTION: liver effects    Past Medical History  Diagnosis Date  . Hypertension   . Hyperlipidemia   . CAD (coronary artery disease)     sees Dr. [name]   . BPH with urinary obstruction   . Restless legs syndrome   . Myocardial infarction   . GERD (gastroesophageal reflux disease)   . Colon polyps   . Chronic low back pain   . IBS (irritable bowel syndrome)     Height 5\' 7"  (1.702 m), weight 201 lb 8 oz (91.4 kg).    [name] PharmD CPP [facility]

## 2014-11-15 ENCOUNTER — Telehealth: Payer: Self-pay | Admitting: *Deleted

## 2014-11-15 NOTE — Telephone Encounter (Signed)
Dr Aundra Dubin reviewed monitor done 10/08/14-11/06/14:  No sig arrhythmias/ PACs.  Pt notified.

## 2014-11-24 ENCOUNTER — Other Ambulatory Visit: Payer: Self-pay | Admitting: Family Medicine

## 2014-11-26 NOTE — Telephone Encounter (Signed)
Call in #120 with 5 rf 

## 2014-12-13 ENCOUNTER — Other Ambulatory Visit (INDEPENDENT_AMBULATORY_CARE_PROVIDER_SITE_OTHER): Payer: Medicare Other | Admitting: *Deleted

## 2014-12-13 DIAGNOSIS — E785 Hyperlipidemia, unspecified: Secondary | ICD-10-CM

## 2014-12-13 LAB — HEPATIC FUNCTION PANEL
ALBUMIN: 4.3 g/dL (ref 3.5–5.2)
ALT: 20 U/L (ref 0–53)
AST: 20 U/L (ref 0–37)
Alkaline Phosphatase: 43 U/L (ref 39–117)
Bilirubin, Direct: 0.1 mg/dL (ref 0.0–0.3)
Total Bilirubin: 0.8 mg/dL (ref 0.2–1.2)
Total Protein: 7.1 g/dL (ref 6.0–8.3)

## 2014-12-13 LAB — LIPID PANEL
CHOL/HDL RATIO: 5
Cholesterol: 160 mg/dL (ref 0–200)
HDL: 34.6 mg/dL — AB (ref >39.00)
LDL CALC: 104 mg/dL — AB (ref 0–99)
NonHDL: 125.4
Triglycerides: 106 mg/dL (ref 0.0–149.0)
VLDL: 21.2 mg/dL (ref 0.0–40.0)

## 2014-12-20 ENCOUNTER — Ambulatory Visit: Payer: Medicare Other | Admitting: Internal Medicine

## 2014-12-23 ENCOUNTER — Other Ambulatory Visit: Payer: Self-pay | Admitting: Cardiology

## 2014-12-24 ENCOUNTER — Encounter (HOSPITAL_BASED_OUTPATIENT_CLINIC_OR_DEPARTMENT_OTHER): Payer: Medicare Other

## 2015-03-04 ENCOUNTER — Encounter: Payer: Self-pay | Admitting: *Deleted

## 2015-03-04 ENCOUNTER — Ambulatory Visit (INDEPENDENT_AMBULATORY_CARE_PROVIDER_SITE_OTHER): Payer: PPO | Admitting: Cardiology

## 2015-03-04 ENCOUNTER — Encounter: Payer: Self-pay | Admitting: Cardiology

## 2015-03-04 VITALS — BP 122/72 | HR 61 | Ht 67.0 in | Wt 203.0 lb

## 2015-03-04 DIAGNOSIS — I739 Peripheral vascular disease, unspecified: Secondary | ICD-10-CM | POA: Insufficient documentation

## 2015-03-04 DIAGNOSIS — R55 Syncope and collapse: Secondary | ICD-10-CM

## 2015-03-04 DIAGNOSIS — R0683 Snoring: Secondary | ICD-10-CM

## 2015-03-04 DIAGNOSIS — I255 Ischemic cardiomyopathy: Secondary | ICD-10-CM

## 2015-03-04 DIAGNOSIS — I2510 Atherosclerotic heart disease of native coronary artery without angina pectoris: Secondary | ICD-10-CM

## 2015-03-04 NOTE — Patient Instructions (Signed)
Your physician has requested that you have a lower extremity arterial duplex. This test is an ultrasound of the arteries in the legs . It looks at arterial blood flow in the legs. Allow one hour for Lower Arterial scans. There are no restrictions or special instructions  Your physician has recommended that you have a sleep study. This test records several body functions during sleep, including: brain activity, eye movement, oxygen and carbon dioxide blood levels, heart rate and rhythm, breathing rate and rhythm, the flow of air through your mouth and nose, snoring, body muscle movements, and chest and belly movement.  You have been referred to Lipid clinic for follow up on your cholesterol. Gay Filler, the pharmacist should give you a call in the next day or so.  Call [phone] if you do not get a call in the next few days.   Your physician wants you to follow-up in: 6 months with Dr Aundra Dubin. (September 2016).  You will receive a reminder letter in the mail two months in advance. If you don't receive a letter, please call our office to schedule the follow-up appointment.
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
## 2015-03-07 ENCOUNTER — Ambulatory Visit (HOSPITAL_COMMUNITY): Payer: PPO | Attending: Cardiology | Admitting: Cardiology

## 2015-03-07 DIAGNOSIS — Z951 Presence of aortocoronary bypass graft: Secondary | ICD-10-CM | POA: Insufficient documentation

## 2015-03-07 DIAGNOSIS — I739 Peripheral vascular disease, unspecified: Secondary | ICD-10-CM

## 2015-03-07 DIAGNOSIS — I2510 Atherosclerotic heart disease of native coronary artery without angina pectoris: Secondary | ICD-10-CM | POA: Insufficient documentation

## 2015-03-07 DIAGNOSIS — R0683 Snoring: Secondary | ICD-10-CM

## 2015-03-07 DIAGNOSIS — Z87891 Personal history of nicotine dependence: Secondary | ICD-10-CM | POA: Diagnosis not present

## 2015-03-07 DIAGNOSIS — R0989 Other specified symptoms and signs involving the circulatory and respiratory systems: Secondary | ICD-10-CM

## 2015-03-07 DIAGNOSIS — I10 Essential (primary) hypertension: Secondary | ICD-10-CM | POA: Insufficient documentation

## 2015-03-07 NOTE — Progress Notes (Signed)
Lower arterial doppler performed. 

## 2015-03-18 ENCOUNTER — Telehealth: Payer: Self-pay | Admitting: Cardiology

## 2015-03-18 NOTE — Telephone Encounter (Signed)
New Msg       Pt calling, states he is supposed to contact Elberta Leatherwood.     Please return call. Pt not sure why he needs to speak with Gay Filler.

## 2015-03-19 NOTE — Telephone Encounter (Signed)
Spoke with patient about PCSK-9 application.  He will come by to sign forms on Wednesday at [facility]

## 2015-04-02 ENCOUNTER — Ambulatory Visit (INDEPENDENT_AMBULATORY_CARE_PROVIDER_SITE_OTHER): Payer: PPO | Admitting: Pharmacist Clinician (PhC)/ Clinical Pharmacy Specialist

## 2015-04-02 VITALS — Ht 67.0 in | Wt 201.0 lb

## 2015-04-02 DIAGNOSIS — E785 Hyperlipidemia, unspecified: Secondary | ICD-10-CM

## 2015-04-02 NOTE — Patient Instructions (Signed)
Inject Repatha every 2 weeks, next dose due April 19 or another day that week.  Continue with all other medications.

## 2015-04-03 ENCOUNTER — Other Ambulatory Visit: Payer: Self-pay | Admitting: Cardiology

## 2015-04-03 ENCOUNTER — Other Ambulatory Visit: Payer: Self-pay | Admitting: Family Medicine

## 2015-04-04 NOTE — Telephone Encounter (Signed)
Larey Dresser, MD at 03/04/2015 11:36 PM  carvedilol (COREG) 6.25 MG tablet TAKE 1 TABLET BY MOUTH TWICE A DAY CORONARY ARTERY DISEASE  History of CABG. No chest pain, recent Cardiolite showing no ischemia. Continue ASA 81, ACEI, Coreg

## 2015-04-05 ENCOUNTER — Encounter: Payer: Self-pay | Admitting: Pharmacist Clinician (PhC)/ Clinical Pharmacy Specialist

## 2015-04-05 NOTE — Assessment & Plan Note (Addendum)
Due to his significant cardiac history Charles Coffey needs to have an LDL close to 70.  Because we are unable to use any statins due to increased LFTs and Zetia causes myalgias, I will start Charles Coffey on Repatha 140 mg every 2 weeks.  Paperwork has been signed and will fax to specialty pharmacy.  Today we gave Charles Coffey first dose with sample in the office.  Pt tolerated without any incident.  Gave second sample to use in 2 weeks.  He is to continue all other meds.  Will repeat labs after patient has had Repatha for 10-12 weeks

## 2015-04-05 NOTE — Progress Notes (Signed)
     04/05/2015 Charles Coffey [birth year] [id number]   HPI:  Charles Coffey is a 80 y.o. male patient of Dr Charles Coffey, with a [place] below who presents today for a lipid clinic evaluation.  His cardiac history is significant for MI in 1995 and CABG x 3 in 2002.  He has been unable to tolerate statins, as they caused an increase in LFTs.  These events were all when he lived in [place] and should be noted in any historical records we have.  Zetia caused him to have myalgias.  He currently takes Niacin, which he tolerates well, and CholestOff, an OTC product containing plant sterols and stanols.    His family history includes 2 maternal uncles who both died young from MI's (16 and 64).  His mother had multiple MI's but lived to the age of 26.  His father died at 15 from an aneurysm and 1 brother had CABG while in his 22s.   He has 2 sons who have no known cardiac disease.    Charles Coffey has been unable to exercise due to back problems.  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  Has been taking short walks lately.  He has recently cut gluten from his diet, in hopes of better controlling IBS symptoms.   Labs: 01/2014 - TC 168, TG 93, HDL 42.3, LDL 107, non HDL 125.7 01/2013 - TC 173, TG 72, HDL 43.2, LDL 115, non HDL 129.8    Current Outpatient Prescriptions  Medication Sig Dispense Refill  . aspirin 81 MG tablet Take 81 mg by mouth daily.      . carvedilol (COREG) 6.25 MG tablet TAKE 1 TABLET BY MOUTH TWICE A DAY 180 tablet 3  . lisinopril (PRINIVIL,ZESTRIL) 10 MG tablet TAKE 1 TABLET BY MOUTH EVERY DAY 30 tablet 5  . lubiprostone (AMITIZA) 8 MCG capsule Take 1 capsule (8 mcg total) by mouth 2 (two) times daily with a meal. 60 capsule 5  . Niacin CR 1000 MG TBCR 2 tablets at bedtime    . pramipexole (MIRAPEX) 1 MG tablet TAKE 1 AND 1/2 TABLETS BY MOUTH AT BEDTIME 45 tablet 5  . traMADol (ULTRAM) 50 MG tablet TAKE 1 TABLET BY MOUTH EVERY 6 HOURS 120 tablet 5    No current facility-administered medications for this visit.    Allergies  Allergen Reactions  . Antihistamines, Loratadine-Type   . Statins     REACTION: liver effects    Past Medical History  Diagnosis Date  . Hypertension   . Hyperlipidemia   . CAD (coronary artery disease)     sees Dr. Loralie Coffey   . BPH with urinary obstruction   . Restless legs syndrome   . Myocardial infarction   . GERD (gastroesophageal reflux disease)   . Colon polyps   . Chronic low back pain   . IBS (irritable bowel syndrome)     Height 5\' 7"  (1.702 m), weight 201 lb (91.173 kg).    Charles Coffey PharmD CPP Charles Coffey

## 2015-04-25 ENCOUNTER — Encounter: Payer: Self-pay | Admitting: *Deleted

## 2015-05-08 ENCOUNTER — Ambulatory Visit (HOSPITAL_BASED_OUTPATIENT_CLINIC_OR_DEPARTMENT_OTHER): Payer: PPO | Attending: Cardiology | Admitting: Radiology

## 2015-05-08 VITALS — Ht 68.0 in | Wt 196.0 lb

## 2015-05-08 DIAGNOSIS — I739 Peripheral vascular disease, unspecified: Secondary | ICD-10-CM | POA: Diagnosis not present

## 2015-05-08 DIAGNOSIS — G4730 Sleep apnea, unspecified: Secondary | ICD-10-CM | POA: Diagnosis not present

## 2015-05-08 DIAGNOSIS — R5383 Other fatigue: Secondary | ICD-10-CM | POA: Insufficient documentation

## 2015-05-08 DIAGNOSIS — I70219 Atherosclerosis of native arteries of extremities with intermittent claudication, unspecified extremity: Secondary | ICD-10-CM

## 2015-05-08 DIAGNOSIS — R0683 Snoring: Secondary | ICD-10-CM

## 2015-05-15 ENCOUNTER — Encounter: Payer: Self-pay | Admitting: Internal Medicine

## 2015-05-15 ENCOUNTER — Ambulatory Visit (INDEPENDENT_AMBULATORY_CARE_PROVIDER_SITE_OTHER): Payer: PPO | Admitting: Internal Medicine

## 2015-05-15 ENCOUNTER — Telehealth: Payer: Self-pay | Admitting: Cardiology

## 2015-05-15 VITALS — BP 102/58 | HR 52 | Ht 68.0 in | Wt 200.0 lb

## 2015-05-15 DIAGNOSIS — D126 Benign neoplasm of colon, unspecified: Secondary | ICD-10-CM

## 2015-05-15 DIAGNOSIS — K5900 Constipation, unspecified: Secondary | ICD-10-CM | POA: Diagnosis not present

## 2015-05-15 DIAGNOSIS — K589 Irritable bowel syndrome without diarrhea: Secondary | ICD-10-CM | POA: Diagnosis not present

## 2015-05-15 DIAGNOSIS — K5909 Other constipation: Secondary | ICD-10-CM

## 2015-05-15 MED ORDER — LINACLOTIDE 290 MCG PO CAPS: 290.0000 ug | ORAL_CAPSULE | Freq: Every day | ORAL | Status: DC

## 2015-05-15 NOTE — Telephone Encounter (Signed)
Spoke with patient, he was able to get assistance to help with $300 copay for Repatha, but is concerned about going into donut-hole and what his other meds will cost at that time.  Not sure he wants to continue with Repatha.  Assured him that if he chooses to stop that is his decision to make.  Last LDL was 107, had MI in 1995, CABG 2003, no problems since.  He will call in 3-4 weeks after using current supply of medication to let us know what he plans to do.  Advised that he get lipid labs drawn about time of last dose so we can see what benefit there was.

## 2015-05-15 NOTE — Patient Instructions (Addendum)
Please discontinue Amitiza.  We have sent the following medications to your pharmacy for you to pick up at your convenience: Linzess 290 mcg 1 capsule daily 30 minutes before a meal (in place of Amitiza)  Please call our office in 1 month with a follow up on your condition.  Please follow up with Dr Hilarie Fredrickson in 6 months.

## 2015-05-15 NOTE — Telephone Encounter (Signed)
Clarified that patient is wanting to speak with the Pharmacist Erasmo Downer. She is located at the [facility] and is only here at [facility] on Tuesdays. Called [facility] office. LM on VM for Kristin. Routing message to [place] for follow up.

## 2015-05-15 NOTE — Telephone Encounter (Signed)
Follow Up  Pt called states that something has come up as it pertains to his device? Pt was not clear about his request and why he needed the message sent. He simply wanted to get [place]. Please assist   Sorry the pt refused to give any further details

## 2015-05-15 NOTE — Progress Notes (Signed)
   Subjective:    Patient ID: Charles Coffey, male    DOB: 1935/02/03, 80 y.o.   MRN: [id number]  HPI Charles Coffey is a 80 year old man with past medical history of chronic constipation, IBS, adenomatous colon polyps, GERD, COPD, hypertension and hyperlipidemia who seen in follow-up. He was seen initially on 09/20/2014 to consider repeat screening colonoscopy after an episode of diverticulitis. He did return for colonoscopy on 10/17/2014. This revealed 5 sessile polyps ranging in size from 2-7 mm which were removed. These were found to be tubular adenomas. There was mild diverticulosis in the sigmoid colon and melanosis coli throughout.  A laxative trial was recommended but Linzess was initially not preferred by insurance thus he tried Amitiza 8 g twice a day. Occasionally this works good but only if he is following a very low fiber diet. He is still having issues with constipation and lower abdominal cramping discomfort. Cramping gets dramatically better after he is able to have a bowel movement. No fevers or chills. Good appetite. No trouble swallowing. No rectal bleeding or melena.  Review of Systems As per history of present illness, otherwise negative  Current Medications, Allergies, Past Medical History, Past Surgical History, Family History and Social History were reviewed in [facility] record.     Objective:   Physical Exam BP 102/58 mmHg  Pulse 52  Ht 5\' 8"  (1.727 m)  Wt 200 lb (90.719 kg)  BMI 30.42 kg/m2 Constitutional: Well-developed and well-nourished. No distress. HEENT: Normocephalic and atraumatic. Conjunctivae are normal.  No scleral icterus. Neck: Neck supple. Trachea midline. Cardiovascular: Normal rate, regular rhythm and intact distal pulses. No M/R/G Pulmonary/chest: Effort normal and breath sounds normal. No wheezing, rales or rhonchi. Abdominal: Soft, nontender, nondistended. Bowel sounds active throughout.  Extremities: no clubbing, cyanosis,  or edema Neurological: Alert and oriented to person place and time. Skin: Skin is warm and dry. No rashes noted. Psychiatric: Normal mood and affect. Behavior is normal.  Colonoscopy - reviewed including with the patient     Assessment & Plan:  80 year old man with past medical history of chronic constipation, IBS, adenomatous colon polyps, GERD, COPD, hypertension and hyperlipidemia who seen in follow-up.  1. Constipation with IBS -- less than ideal response to Amitiza. Discontinue Amitiza. Probable Linzess 290 g 30 minutes before breakfast daily. Contact me with any diarrhea., 6 month follow-up, sooner if necessary  2. Adenomatous colon polyps -- 5 polyps removed last year, likely will not require surveillance secondary to age, though repeat would be recommended in October 2018 can discuss based on overall health at that time

## 2015-05-21 ENCOUNTER — Telehealth: Payer: Self-pay | Admitting: Cardiology

## 2015-05-21 ENCOUNTER — Telehealth: Payer: Self-pay | Admitting: *Deleted

## 2015-05-21 NOTE — Telephone Encounter (Signed)
Left message to call back  

## 2015-05-21 NOTE — Telephone Encounter (Signed)
Please let patient know that he does not have any significant sleep apnea

## 2015-05-21 NOTE — Sleep Study (Signed)
   NAME: Charles Coffey DATE OF BIRTH:  [birth year] MEDICAL RECORD NUMBER [id number]  LOCATION: [facility]  PHYSICIAN: Ellington Cornia R  DATE OF STUDY: 05/08/2015  SLEEP STUDY TYPE: Nocturnal Polysomnogram               REFERRING PHYSICIAN: Larey Dresser, MD  INDICATION FOR STUDY: witnessed apnea during sleep, snoring and excessive daytime fatigue  EPWORTH SLEEPINESS SCORE: 9 HEIGHT: 5\' 8"  (172.7 cm)  WEIGHT: 196 lb (88.905 kg)    Body mass index is 29.81 kg/(m^2).  NECK SIZE: 16.5 in.  MEDICATIONS: Reviewed in the chart  SLEEP ARCHITECTURE: The patient slept for a total of 267 minutes out of a total sleep time of 334 minutes.  There was no slow wave sleep and 40 minutes of REM sleep.  The onset to sleep latency was prolonged at 35 minutes and the onset to REM sleep latency was short at 47 minutes.  The sleep efficiency was reduced at 72%.    RESPIRATORY DATA: There were no apneas and 21 hypopneas.  The AHI was in the normal range at 4.7 events per hour.  Most events occurred during NREM sleep in the non supine position.  There was mild snoring noted.  OXYGEN DATA: The average oxygen saturation during the study was 91%.  The lowest oxygen saturation was 85%.  There total time spent with oxygen saturations < 88% was 1.6 minutes.    CARDIAC DATA: The patient maintained NSR with PVC's and PAC's during the study.  The average heart rate was 60 bpm.  The lowest heart rate was 30 bpm and the highest heart rate was 164 bpm.    MOVEMENT/PARASOMNIA: There were no periodic limb movements or REM sleep behavior disorders noted during the study.    IMPRESSION/ RECOMMENDATION:   1.  No significant sleep disordered breathing was noted during the study.  The overall AHI was in the normal range at 4.7 events per hour.   Most events occurred during NREM sleep in the non supine position.  2.  Mild snoring was noted during the study. 3.  Reduced sleep efficiency with increased  frequency of arousals due to spontaneous events.   4.  Abnormal sleep architecture with no slow wave sleep. 5.  No significant oxygen saturations were noted.   The total time spent with oxygen saturations < 88% was 1.6 minutes.  6.  The patient maintained NSR with PVC's and PAC's.   Signed: Sueanne Margarita Diplomate, American Board of Sleep Medicine  ELECTRONICALLY SIGNED ON:  05/21/2015, 11:17 AM [place] PH: [phone]   FX: [phone] [place]

## 2015-05-21 NOTE — Telephone Encounter (Signed)
No significant OSA    ----- Message -----    From: Sueanne Margarita, MD    Sent: 05/21/2015 11:24 AM

## 2015-05-21 NOTE — Telephone Encounter (Signed)
Pt.notified

## 2015-05-22 NOTE — Telephone Encounter (Signed)
Pt is aware of results, stated verbal understanding

## 2015-06-10 ENCOUNTER — Other Ambulatory Visit: Payer: PPO

## 2015-06-11 ENCOUNTER — Ambulatory Visit (INDEPENDENT_AMBULATORY_CARE_PROVIDER_SITE_OTHER): Payer: PPO | Admitting: Family Medicine

## 2015-06-11 ENCOUNTER — Encounter: Payer: Self-pay | Admitting: Family Medicine

## 2015-06-11 VITALS — BP 125/66 | HR 65 | Temp 98.0°F | Ht 68.0 in | Wt 200.0 lb

## 2015-06-11 DIAGNOSIS — M5441 Lumbago with sciatica, right side: Secondary | ICD-10-CM | POA: Diagnosis not present

## 2015-06-11 MED ORDER — PREDNISONE 10 MG PO TABS: ORAL_TABLET | ORAL | Status: DC

## 2015-06-11 MED ORDER — CYCLOBENZAPRINE HCL 10 MG PO TABS: 10.0000 mg | ORAL_TABLET | Freq: Three times a day (TID) | ORAL | Status: DC | PRN

## 2015-06-11 NOTE — Progress Notes (Signed)
Pre visit review using our clinic review tool, if applicable. No additional management support is needed unless otherwise documented below in the visit note. 

## 2015-06-11 NOTE — Progress Notes (Signed)
   Subjective:    Patient ID: Charles Coffey, male    DOB: [birth year], 80 y.o.   MRN: [id number]  HPI Here for severe right sided low back pain. He had a surgery at L3-L4 in 2003 while living in [place], [place]. He has seen Dr. Kary Kos for this several times after moving here. For the past 3 days he has had pain in the right lower nack and when he woke up this morning it was severe. The pain radiates down the right leg to the foot, and the leg gets partially numb. He has taken Tramadol with partial relief.    Review of Systems  Constitutional: Negative.   Musculoskeletal: Positive for back pain and gait problem.       Objective:   Physical Exam  Constitutional:  In pain, able to walk slowly   Musculoskeletal:  He is tender in the lower back, especially over the L4-L5 area. ROM is reduced. There is a lot of spasm. SLR are negative           Assessment & Plan:  He will use moist heat to the area. He has some Oxycodone at home so I suggested he use this for pain instead of Tramadol. Add Flexeril and a prednisone taper. Recheck prn

## 2015-06-13 ENCOUNTER — Ambulatory Visit: Payer: PPO | Admitting: Pharmacist

## 2015-06-19 ENCOUNTER — Other Ambulatory Visit: Payer: PPO

## 2015-06-20 ENCOUNTER — Ambulatory Visit (INDEPENDENT_AMBULATORY_CARE_PROVIDER_SITE_OTHER): Payer: PPO | Admitting: Pharmacist

## 2015-06-20 DIAGNOSIS — E785 Hyperlipidemia, unspecified: Secondary | ICD-10-CM | POA: Diagnosis not present

## 2015-06-20 LAB — LIPID PANEL
Cholesterol: 114 mg/dL (ref 0–200)
HDL: 67.8 mg/dL (ref >39.00)
LDL CALC: 32 mg/dL (ref 0–99)
NONHDL: 46.2
Total CHOL/HDL Ratio: 2
Triglycerides: 73 mg/dL (ref 0.0–149.0)
VLDL: 14.6 mg/dL (ref 0.0–40.0)

## 2015-06-20 LAB — HEPATIC FUNCTION PANEL
ALK PHOS: 43 U/L (ref 39–117)
ALT: 19 U/L (ref 0–53)
AST: 16 U/L (ref 0–37)
Albumin: 4.1 g/dL (ref 3.5–5.2)
BILIRUBIN DIRECT: 0.2 mg/dL (ref 0.0–0.3)
BILIRUBIN TOTAL: 0.7 mg/dL (ref 0.2–1.2)
Total Protein: 7.6 g/dL (ref 6.0–8.3)

## 2015-06-26 NOTE — Progress Notes (Signed)
06/26/2015 Charles Coffey December 10, 1935 [id number]   HPI:  Charles Coffey is a 80 y.o. male patient of Dr Aundra Dubin, with a [place] below who presents today for a lipid clinic follow up.  His cardiac history is significant for MI in 1995 and CABG x 3 in 2002.  He has been unable to tolerate statins, as they caused an increase in LFTs.  These events were all when he lived in [place] and should be noted in any historical records we have.  Zetia caused him to have myalgias.  At his last visit, we started patient on Repatha 140mg  every 2 weeks.  He states he is doing well with the injections and no issues tolerating.  He has had approximately 6 injections.  He continues to take Niacin and Cholestoff as well  His family history includes 2 maternal uncles who both died young from MI's (72 and 20).  His mother had multiple MI's but lived to the age of 71.  His father died at 67 from an aneurysm and 1 brother had CABG while in his 43s.   He has 2 sons who have no known cardiac disease.     Labs: 05/2015- TC 114, TG 73, HDL 67.8, LDL 32 (Repatha 140mg , Niacin 2gm/day) 11/2014- TC 160, TG 106, HDL 35, LDL 104 01/2014 - TC 168, TG 93, HDL 42.3, LDL 107, non HDL 125.7 01/2013 - TC 173, TG 72, HDL 43.2, LDL 115, non HDL 129.8    Current Outpatient Prescriptions  Medication Sig Dispense Refill  . aspirin 81 MG tablet Take 81 mg by mouth daily.      . carvedilol (COREG) 6.25 MG tablet TAKE 1 TABLET BY MOUTH TWICE A DAY 180 tablet 3  . cyclobenzaprine (FLEXERIL) 10 MG tablet Take 1 tablet (10 mg total) by mouth 3 (three) times daily as needed for muscle spasms. 90 tablet 2  . Evolocumab (REPATHA [place]) Inject into the skin. Once every other week    . Linaclotide (LINZESS) 290 MCG CAPS capsule Take 1 capsule (290 mcg total) by mouth daily. 30 minutes prior to a meal 30 capsule 3  . lisinopril (PRINIVIL,ZESTRIL) 10 MG tablet TAKE 1 TABLET BY MOUTH EVERY DAY 30 tablet 5  . Niacin CR 1000 MG TBCR 2 tablets at  bedtime    . pramipexole (MIRAPEX) 1 MG tablet TAKE 1 AND 1/2 TABLETS BY MOUTH AT BEDTIME 45 tablet 1  . predniSONE (DELTASONE) 10 MG tablet Take 4 tabs a day for 4 days, then 3 a day for 4 days, then 2 a day for 4 days, then 1 a day for 4 days, then stop 60 tablet 0  . traMADol (ULTRAM) 50 MG tablet TAKE 1 TABLET BY MOUTH EVERY 6 HOURS 120 tablet 5   No current facility-administered medications for this visit.    Allergies  Allergen Reactions  . Antihistamines, Loratadine-Type   . Statins     REACTION: liver effects    Past Medical History  Diagnosis Date  . Hypertension   . Hyperlipidemia   . CAD (coronary artery disease)     sees Dr. [name]   . BPH with urinary obstruction   . Restless legs syndrome   . Myocardial infarction   . GERD (gastroesophageal reflux disease)   . Tubular adenoma of colon   . Chronic low back pain     sees Dr. Kary Kos   . IBS (irritable bowel syndrome)     Assessment and Plan 1.  Hyperlipidemia- Pt's LDL decrease ~80 pts with the addition of Repatha.  Pt is tolerating therapy with no issues.  He did states the pharmacy called and needed information from the MD office so will call and determine what this is.  Will go ahead and stop Niacin as this time because he has no indication for this medication.  Pt is agreeable.     Elberta Leatherwood PharmD CPP [facility]

## 2015-06-28 ENCOUNTER — Other Ambulatory Visit: Payer: Self-pay | Admitting: Family Medicine

## 2015-06-28 ENCOUNTER — Other Ambulatory Visit: Payer: Self-pay | Admitting: Cardiology

## 2015-06-28 NOTE — Telephone Encounter (Signed)
Call in [place] #45 with 5 rf, also Tramadol #120 with 5 rf

## 2015-07-15 ENCOUNTER — Telehealth: Payer: Self-pay | Admitting: Family Medicine

## 2015-07-15 ENCOUNTER — Telehealth: Payer: Self-pay | Admitting: Internal Medicine

## 2015-07-15 NOTE — Telephone Encounter (Signed)
Call in Prednisone 20 mg to take one pill every day, #30 with 2 rf

## 2015-07-15 NOTE — Telephone Encounter (Signed)
Pt called to let us know that the linzess seems to be working ok.

## 2015-07-15 NOTE — Telephone Encounter (Signed)
Patient wanted to let Dr. Sarajane Jews know the predniSONE (DELTASONE) 10 MG tablet has helped with his back pain and if he can have another fill on it?  [id number] - [address] [place], [place]

## 2015-07-16 MED ORDER — PREDNISONE 20 MG PO TABS: 20.0000 mg | ORAL_TABLET | Freq: Every day | ORAL | Status: DC

## 2015-07-16 NOTE — Telephone Encounter (Signed)
Script sent e-scribe and left a voice message for pt.

## 2015-09-17 ENCOUNTER — Other Ambulatory Visit: Payer: Self-pay | Admitting: Internal Medicine

## 2015-10-22 ENCOUNTER — Other Ambulatory Visit: Payer: Self-pay | Admitting: Internal Medicine

## 2015-10-31 ENCOUNTER — Other Ambulatory Visit: Payer: Self-pay | Admitting: Cardiology

## 2015-11-11 ENCOUNTER — Other Ambulatory Visit: Payer: Self-pay | Admitting: Internal Medicine

## 2015-11-12 ENCOUNTER — Other Ambulatory Visit: Payer: Self-pay | Admitting: Internal Medicine

## 2015-12-09 ENCOUNTER — Other Ambulatory Visit: Payer: Self-pay | Admitting: Family Medicine

## 2015-12-25 ENCOUNTER — Other Ambulatory Visit: Payer: Self-pay | Admitting: Cardiology

## 2016-01-15 ENCOUNTER — Encounter: Payer: Self-pay | Admitting: Internal Medicine

## 2016-01-15 ENCOUNTER — Ambulatory Visit (INDEPENDENT_AMBULATORY_CARE_PROVIDER_SITE_OTHER): Payer: PPO | Admitting: Internal Medicine

## 2016-01-15 VITALS — BP 104/52 | HR 64 | Ht 68.0 in | Wt 204.4 lb

## 2016-01-15 DIAGNOSIS — K581 Irritable bowel syndrome with constipation: Secondary | ICD-10-CM | POA: Diagnosis not present

## 2016-01-15 MED ORDER — LINACLOTIDE 290 MCG PO CAPS: ORAL_CAPSULE | ORAL | Status: DC

## 2016-01-15 NOTE — Patient Instructions (Signed)
We have sent the following medications to your pharmacy for you to pick up at your convenience: Linzess 290 mcg daily  Please follow up with Dr Hilarie Fredrickson in 1 year.

## 2016-01-15 NOTE — Progress Notes (Signed)
   Subjective:    Patient ID: Charles Coffey, male    DOB: 12/27/1935, 81 y.o.   MRN: [id number]  HPI Mr. Painter is a 81 year old male with chronic constipation, IBS, adenomatous colon polyps who seen in follow-up. He was last seen in May 2016. At that time he was having issues with constipation and Amitiza was not helpful for him. We switched him to Linzess 290 g daily and he reports this is helped tremendously. He reports a "night and day" difference with bowel habits. He's having daily bowel movements with the medication. No blood in his stool or melena. He's had reduced abdominal bloating and discomfort which was previously associated with constipation. Overall he reports "I feel better". No upper GI complaint including no nausea, vomiting, heartburn, dysphagia or odynophagia. No hepatobiliary complaint. He has recently been started on a monoclonal antibody which has worked great to reduce his LDL by as much as 70%.   Review of Systems As per history of present illness, otherwise negative  Current Medications, Allergies, Past Medical History, Past Surgical History, Family History and Social History were reviewed in [employer] record.     Objective:   Physical Exam BP 104/52 mmHg  Pulse 64  Ht 5\' 8"  (1.727 m)  Wt 204 lb 6.4 oz (92.715 kg)  BMI 31.09 kg/m2 Constitutional: Well-developed and well-nourished. No distress. HEENT: Normocephalic and atraumatic.  Conjunctivae are normal.  No scleral icterus. Neck: Neck supple. Trachea midline. Cardiovascular: Normal rate, regular rhythm and intact distal pulses. No M/R/G Pulmonary/chest: Effort normal and breath sounds normal. No wheezing, rales or rhonchi. Abdominal: Soft, nontender, nondistended. Bowel sounds active throughout. There are no masses palpable. No hepatosplenomegaly. Extremities: no clubbing, cyanosis, or edema Neurological: Alert and oriented to person place and time. Skin: Skin is warm and  dry. Psychiatric: Normal mood and affect. Behavior is normal.     Assessment & Plan:  81 year old male with chronic constipation, IBS, adenomatous colon polyps who seen in follow-up.  1. Chronic constipation with IBS -- excellent response to Linzess. Continue Linzess 290 g daily. Up-to-date with colonoscopy. Follow-up in one year, sooner if necessary

## 2016-01-20 ENCOUNTER — Other Ambulatory Visit: Payer: Self-pay | Admitting: Pharmacist

## 2016-01-29 ENCOUNTER — Other Ambulatory Visit: Payer: Self-pay | Admitting: Cardiology

## 2016-02-19 ENCOUNTER — Other Ambulatory Visit: Payer: Self-pay | Admitting: Otolaryngology

## 2016-02-19 DIAGNOSIS — H9313 Tinnitus, bilateral: Secondary | ICD-10-CM

## 2016-02-19 DIAGNOSIS — H903 Sensorineural hearing loss, bilateral: Secondary | ICD-10-CM

## 2016-02-29 ENCOUNTER — Other Ambulatory Visit: Payer: Self-pay | Admitting: Family Medicine

## 2016-02-29 ENCOUNTER — Other Ambulatory Visit: Payer: Self-pay | Admitting: Cardiology

## 2016-03-03 ENCOUNTER — Ambulatory Visit
Admission: RE | Admit: 2016-03-03 | Discharge: 2016-03-03 | Disposition: A | Discharge status: Home / Self Care | Payer: PPO | Source: Ambulatory Visit | Attending: Otolaryngology | Admitting: Otolaryngology

## 2016-03-03 DIAGNOSIS — H9313 Tinnitus, bilateral: Secondary | ICD-10-CM

## 2016-03-03 DIAGNOSIS — H903 Sensorineural hearing loss, bilateral: Secondary | ICD-10-CM

## 2016-03-03 MED ORDER — GADOBENATE DIMEGLUMINE 529 MG/ML IV SOLN
18.0000 mL | Freq: Once | INTRAVENOUS | Status: AC | PRN
  Administered 2016-03-03: 18 mL via INTRAVENOUS

## 2016-03-04 NOTE — Telephone Encounter (Signed)
Call in #120 with 5 rf 

## 2016-03-12 ENCOUNTER — Other Ambulatory Visit: Payer: Self-pay | Admitting: Pharmacist

## 2016-03-12 MED ORDER — EVOLOCUMAB 140 MG/ML ~~LOC~~ SOAJ: 140.0000 mg | SUBCUTANEOUS | Status: DC

## 2016-04-01 ENCOUNTER — Other Ambulatory Visit: Payer: Self-pay | Admitting: Cardiology

## 2016-04-21 ENCOUNTER — Other Ambulatory Visit: Payer: Self-pay | Admitting: Neurosurgery

## 2016-04-21 DIAGNOSIS — M5023 Other cervical disc displacement, cervicothoracic region: Secondary | ICD-10-CM

## 2016-04-26 ENCOUNTER — Other Ambulatory Visit: Payer: PPO

## 2016-04-26 ENCOUNTER — Ambulatory Visit
Admission: RE | Admit: 2016-04-26 | Discharge: 2016-04-26 | Disposition: A | Discharge status: Home / Self Care | Payer: PPO | Source: Ambulatory Visit | Attending: Neurosurgery | Admitting: Neurosurgery

## 2016-04-26 DIAGNOSIS — M5023 Other cervical disc displacement, cervicothoracic region: Secondary | ICD-10-CM

## 2016-04-29 ENCOUNTER — Other Ambulatory Visit: Payer: Self-pay | Admitting: Cardiology

## 2016-05-12 ENCOUNTER — Telehealth: Payer: Self-pay | Admitting: *Deleted

## 2016-05-12 NOTE — Telephone Encounter (Signed)
Ok to hold ASA if no new symptoms.

## 2016-05-12 NOTE — Telephone Encounter (Signed)
I attempted to contact pt at number listed to see if he has had any new symptoms since last office visit with Dr Aundra Dubin, I received recorded message mailbox was full and could not leave a message.

## 2016-05-12 NOTE — Telephone Encounter (Signed)
Request for surgical clearance:  1. What type of surgery is being performed? ESI Cervical  2. When is this surgery scheduled? 05/22/26 9:45AM  3. Are there any medications that need to be held prior to surgery and how long? Discontinue aspirin 6 days prior  4. Name of physician performing surgery? Dr Marlaine Hind  5. What is your office phone and fax number? Fax [phone]   Dr Vassie Moment has not seen you since 03/04/15. You had said you wanted to see him prior to giving authorization to discontinue aspirin for 6 days prior.  Mr Michaelsen has an appointment with you May 23,2017. Since it will only be 3 days prior to procedure when you see him May 23,2017, would you be able to give authorization now for discontinuing aspirin for 6 days prior to procedure pending appointment with you May 23,2017?

## 2016-05-15 ENCOUNTER — Encounter: Payer: Self-pay | Admitting: Neurology

## 2016-05-15 ENCOUNTER — Ambulatory Visit (INDEPENDENT_AMBULATORY_CARE_PROVIDER_SITE_OTHER): Payer: PPO | Admitting: Neurology

## 2016-05-15 VITALS — BP 112/64 | HR 60 | Ht 68.0 in | Wt 197.5 lb

## 2016-05-15 DIAGNOSIS — M47812 Spondylosis without myelopathy or radiculopathy, cervical region: Secondary | ICD-10-CM | POA: Diagnosis not present

## 2016-05-15 DIAGNOSIS — E538 Deficiency of other specified B group vitamins: Secondary | ICD-10-CM

## 2016-05-15 HISTORY — DX: Spondylosis without myelopathy or radiculopathy, cervical region: M47.812

## 2016-05-15 NOTE — Progress Notes (Signed)
Reason for visit: Abnormal MRI cervical spine  Referring physician: Dr. Rudene Christians is a 81 y.o. male  History of present illness:  Mr. Charania is a 81 year old right-handed white male with a history of cervical spondylosis, he has reported some discomfort in the left neck and shoulder that began about 3 or 4 months prior to this evaluation. The patient reports some discomfort going into the upper arm and forearm on the left, with some numbness and tingling in the hand on the left. He indicates that when he turns his head to the right he can induce discomfort into the left shoulder and upper arm. He has been on a trial of prednisone which has been beneficial transiently. He has been set up for an epidural steroid injection which has not yet been done. The patient has undergone MRI evaluation of the cervical spine that shows multilevel spondylosis, but there appears to be some posterior cord signal around the C3 level, the possibility of a copper or vitamin B12 deficiency was entertained. The patient himself does not report any issues with paresthesias on the feet or legs, he denies any balance issues or difficulty with memory. The patient denies any weakness of the extremities. He has undergone EMG and nerve conduction study involving the left arm by Dr. Brien Few, the patient was told he did not have carpal tunnel syndrome. The actual results of the EMG study are not available to me. He is sent to this office for an evaluation.  Past Medical History  Diagnosis Date  . Hypertension   . Hyperlipidemia   . CAD (coronary artery disease)     sees Dr. [name]   . BPH with urinary obstruction   . Restless legs syndrome   . Myocardial infarction ([place])   . GERD (gastroesophageal reflux disease)   . Tubular adenoma of colon   . Chronic low back pain     sees Dr. Kary Kos   . IBS (irritable bowel syndrome)   . Cervical spondylosis without myelopathy August 25, 202017    Past Surgical  History  Procedure Laterality Date  . Prostate surgery  06-27-12    per Dr. Roni Bread, had CTT  . Colonoscopy  2010    in [place], polyps, repeat in 5 yrs   . Lumbar fusion  2003    L3-L4  . Tonsillectomy    . Heart bypass      Family History  Problem Relation Age of Onset  . Heart disease Mother   . Heart attack Mother   . Heart disease Maternal Uncle     x 2  . Aneurysm Father     femoral artery  . Heart disease Brother   . Colon cancer Neg Hx   . Esophageal cancer Neg Hx   . Pancreatic cancer Neg Hx   . Kidney disease Neg Hx   . Liver disease Neg Hx     Social history:  reports that he quit smoking about 37 years ago. His smoking use included Cigarettes. He has never used smokeless tobacco. He reports that he drinks alcohol. He reports that he does not use illicit drugs.  Medications:  Prior to Admission medications   Medication Sig Start Date End Date Taking? Authorizing Provider  aspirin 81 MG tablet Take 81 mg by mouth daily.     Yes Historical Provider, MD  carvedilol (COREG) 6.25 MG tablet TAKE 1 TABLET BY MOUTH TWICE A DAY 04/30/16  Yes Larey Dresser, MD  Evolocumab (  REPATHA SURECLICK) [id number] MG/ML SOAJ Inject 140 mg into the skin every 14 (fourteen) days. 03/12/16  Yes Larey Dresser, MD  Linaclotide (LINZESS) 290 MCG CAPS capsule TAKE 1 CAPSULE (290 MCG TOTAL) BY MOUTH DAILY. 30 MINUTES PRIOR TO A MEAL 01/15/16  Yes Jerene Bears, MD  lisinopril (PRINIVIL,ZESTRIL) 10 MG tablet TAKE 1 TABLET BY MOUTH EVERY DAY (NEED APPT) 03/02/16  Yes Larey Dresser, MD  pramipexole (MIRAPEX) 1 MG tablet TAKE 1 AND 1/2 TABLETS BY MOUTH AT BEDTIME 12/11/15  Yes [name], MD  sulfamethoxazole-trimethoprim (BACTRIM DS,SEPTRA DS) 800-160 MG tablet Take 1 tablet by mouth daily. 05/08/16  Yes Historical Provider, MD  traMADol (ULTRAM) 50 MG tablet TAKE 1 TABLET EVERY 6 HOURS AS NEEDED 03/04/16  Yes [name], MD  esomeprazole (NEXIUM) 40 MG capsule Take by mouth. Reported on 08/21/202017     Historical Provider, MD      Allergies  Allergen Reactions  . Antihistamines, Loratadine-Type   . Statins     REACTION: liver effects    ROS:  Out of a complete 14 system review of symptoms, the patient complains only of the following symptoms, and all other reviewed systems are negative.  Hearing loss, ringing in the ears Snoring Easy bruising Joint pain Memory loss Restless legs  Blood pressure 112/64, pulse 60, height 5\' 8"  (1.727 m), weight 197 lb 8 oz (89.585 kg).  Physical Exam  General: The patient is alert and cooperative at the time of the examination.  Eyes: Pupils are equal, round, and reactive to light. Discs are flat bilaterally.  Neck: The neck is supple, no carotid bruits are noted.  Respiratory: The respiratory examination is clear.  Cardiovascular: The cardiovascular examination reveals a regular rate and rhythm, no obvious murmurs or rubs are noted.  Neuromuscular: Range of movement of the cervical spine is relatively full.  Skin: Extremities are without significant edema.  Neurologic Exam  Mental status: The patient is alert and oriented x 3 at the time of the examination. The patient has apparent normal recent and remote memory, with an apparently normal attention span and concentration ability.  Cranial nerves: Facial symmetry is present. There is good sensation of the face to pinprick and soft touch bilaterally. The strength of the facial muscles and the muscles to head turning and shoulder shrug are normal bilaterally. Speech is well enunciated, no aphasia or dysarthria is noted. Extraocular movements are full. Visual fields are full. The tongue is midline, and the patient has symmetric elevation of the soft palate. No obvious hearing deficits are noted.  Motor: The motor testing reveals 5 over 5 strength of all 4 extremities. Good symmetric motor tone is noted throughout.  Sensory: Sensory testing is intact to pinprick, soft touch, vibration  sensation, and position sense on all 4 extremities. No evidence of extinction is noted.  Coordination: Cerebellar testing reveals good finger-nose-finger and heel-to-shin bilaterally.  Gait and station: Gait is normal. Tandem gait is slightly unsteady. Romberg is negative. No drift is seen.  Reflexes: Deep tendon reflexes are symmetric, but are depressed bilaterally. Toes are downgoing bilaterally.    MRI cervical 04/26/16:  IMPRESSION: 1. Progressive C7-T1 facet arthrosis with new anterolisthesis and mild bilateral foraminal stenosis. 2. Unchanged appearance of advanced diffuse cervical disc degeneration elsewhere with moderate to severe foraminal stenosis as above. 3. Suspected abnormal signal in the dorsal spinal cord from C2-C4, though evaluation is limited by motion. This could reflect subacute combined degeneration related to B12 deficiency, with  other considerations including copper deficiency, prior inflammation, demyelinating disease, or less likely infection.  * MRI scan images were reviewed online. I agree with the written report.    Assessment/Plan:  1. Cervical spondylosis, left arm discomfort  2. Abnormal MRI cervical spine  Clinically, the patient does not appear to have evidence of a vitamin B12 deficiency. A severe vitamin B12 deficiency may result in MRI abnormalities within the spinal cord. The patient will be sent for blood work today, if this is unremarkable, no further management will be required. The patient appears to have symptoms of nerve root impingement on the left. He will be getting an epidural steroid injection in the near future.  Jill Alexanders MD 10-30-202017 3:48 PM  [facility] [address] [place], [place] [address]  Phone [phone] Fax [phone]

## 2016-05-15 NOTE — Telephone Encounter (Signed)
Pt states he has not had any new cardiac symptoms since office visit with Dr Aundra Dubin 03/04/15.  I will fax this note to Dr Brien Few.

## 2016-05-16 LAB — VITAMIN B12: Vitamin B-12: 640 pg/mL (ref 211–946)

## 2016-05-16 LAB — COPPER, SERUM: COPPER: 91 ug/dL (ref 72–166)

## 2016-05-19 ENCOUNTER — Ambulatory Visit (INDEPENDENT_AMBULATORY_CARE_PROVIDER_SITE_OTHER): Payer: PPO | Admitting: Cardiology

## 2016-05-19 ENCOUNTER — Encounter: Payer: Self-pay | Admitting: Cardiology

## 2016-05-19 VITALS — BP 122/64 | HR 59 | Ht 68.0 in | Wt 198.0 lb

## 2016-05-19 DIAGNOSIS — I252 Old myocardial infarction: Secondary | ICD-10-CM | POA: Diagnosis not present

## 2016-05-19 DIAGNOSIS — E785 Hyperlipidemia, unspecified: Secondary | ICD-10-CM | POA: Diagnosis not present

## 2016-05-19 DIAGNOSIS — I2510 Atherosclerotic heart disease of native coronary artery without angina pectoris: Secondary | ICD-10-CM

## 2016-05-19 LAB — LIPID PANEL
Cholesterol: 88 mg/dL — ABNORMAL LOW (ref 125–200)
HDL: 42 mg/dL (ref >=40)
LDL CALC: 25 mg/dL (ref <130)
Total CHOL/HDL Ratio: 2.1 Ratio (ref <=5.0)
Triglycerides: 104 mg/dL (ref <150)
VLDL: 21 mg/dL (ref <30)

## 2016-05-19 LAB — BASIC METABOLIC PANEL
BUN: 16 mg/dL (ref 7–25)
CHLORIDE: 104 mmol/L (ref 98–110)
CO2: 20 mmol/L (ref 20–31)
CREATININE: 1.23 mg/dL — AB (ref 0.70–1.18)
Calcium: 9.2 mg/dL (ref 8.6–10.3)
Glucose, Bld: 91 mg/dL (ref 65–99)
Potassium: 4.5 mmol/L (ref 3.5–5.3)
Sodium: 134 mmol/L — ABNORMAL LOW (ref 135–146)

## 2016-05-19 MED ORDER — EVOLOCUMAB 140 MG/ML ~~LOC~~ SOAJ: 140.0000 mg | SUBCUTANEOUS | Status: DC

## 2016-05-19 NOTE — Patient Instructions (Signed)
Medication Instructions:  Your physician recommends that you continue on your current medications as directed. Please refer to the Current Medication list given to you today.   Labwork: Lipid profile/BMET/CBCd today  Testing/Procedures: None   Follow-Up: Your physician recommends that you schedule a follow-up appointment the end of this week in the Lipid Clinic.  Your physician wants you to follow-up in: 1 year with Dr Aundra Dubin. (May 2018).  You will receive a reminder letter in the mail two months in advance. If you don't receive a letter, please call our office to schedule the follow-up appointment.     Any Other Special Instructions Will Be Listed Below (If Applicable).     If you need a refill on your cardiac medications before your next appointment, please call your pharmacy.

## 2016-05-20 LAB — CBC WITH DIFFERENTIAL/PLATELET
Basophils Absolute: 43 {cells}/uL (ref 0–200)
Basophils Relative: 1 %
Eosinophils Absolute: 86 {cells}/uL (ref 15–500)
Eosinophils Relative: 2 %
HCT: 32.8 % — ABNORMAL LOW (ref 38.5–50.0)
Hemoglobin: 10.9 g/dL — ABNORMAL LOW (ref 13.2–17.1)
Lymphocytes Relative: 44 %
Lymphs Abs: 1892 {cells}/uL (ref 850–3900)
MCH: 30.8 pg (ref 27.0–33.0)
MCHC: 33.2 g/dL (ref 32.0–36.0)
MCV: 92.7 fL (ref 80.0–100.0)
Monocytes Absolute: 387 {cells}/uL (ref 200–950)
Monocytes Relative: 9 %
Neutro Abs: 1892 {cells}/uL (ref 1500–7800)
Neutrophils Relative %: 44 %
Platelets: 122 K/uL — ABNORMAL LOW (ref 140–400)
RBC: 3.54 MIL/uL — ABNORMAL LOW (ref 4.20–5.80)
RDW: 17.5 % — ABNORMAL HIGH (ref 11.0–15.0)
WBC: 4.3 K/uL (ref 3.8–10.8)
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
## 2016-05-26 ENCOUNTER — Other Ambulatory Visit: Payer: Self-pay | Admitting: Cardiology

## 2016-05-27 ENCOUNTER — Other Ambulatory Visit: Payer: Self-pay | Admitting: Cardiology

## 2016-05-28 ENCOUNTER — Ambulatory Visit (INDEPENDENT_AMBULATORY_CARE_PROVIDER_SITE_OTHER): Payer: PPO | Admitting: Pharmacist

## 2016-05-28 DIAGNOSIS — E785 Hyperlipidemia, unspecified: Secondary | ICD-10-CM | POA: Diagnosis not present

## 2016-05-28 NOTE — Progress Notes (Signed)
05/28/2016 [name] [birth year] [id number]   HPI:  Charles Coffey is a 81 y.o. male patient of Dr Aundra Dubin, with a JAARS below who presents today for a lipid clinic follow up.  His cardiac history is significant for MI in 1995 and CABG x 3 in 2002.  He has been unable to tolerate statins, as they caused an increase in LFTs.  These events were all when he lived in [place] and should be noted in any historical records we have.  Zetia caused him to have myalgias.  We started patient on Repatha 140mg  every 2 weeks last year.  He states he is doing well with the injections and no issues tolerating. He is able to afford the medication as well.  His family history includes 2 maternal uncles who both died young from MI's (57 and 5).  His mother had multiple MI's but lived to the age of 107.  His father died at 19 from an aneurysm and 1 brother had CABG while in his 25s.   He has 2 sons who have no known cardiac disease.     Labs: 04/2016- TC 88, TG 104, HDL 42, LDL 25 (Repatha 140mg )  05/2015- TC 114, TG 73, HDL 67.8, LDL 32 (Repatha 140mg , Niacin 2gm/day) 11/2014- TC 160, TG 106, HDL 35, LDL 104 01/2014 - TC 168, TG 93, HDL 42.3, LDL 107, non HDL 125.7 01/2013 - TC 173, TG 72, HDL 43.2, LDL 115, non HDL 129.8    Current Outpatient Prescriptions  Medication Sig Dispense Refill  . aspirin 81 MG tablet Take 81 mg by mouth daily.      . carvedilol (COREG) 6.25 MG tablet TAKE 1 TABLET BY MOUTH TWICE A DAY 60 tablet 11  . esomeprazole (NEXIUM) 40 MG capsule Take by mouth. Reported on 2020/09/1616    . Evolocumab (REPATHA SURECLICK) [id number] MG/ML SOAJ Inject 140 mg into the skin every 14 (fourteen) days. 2 pen 11  . Linaclotide (LINZESS) 290 MCG CAPS capsule TAKE 1 CAPSULE (290 MCG TOTAL) BY MOUTH DAILY. 30 MINUTES PRIOR TO A MEAL 30 capsule 10  . lisinopril (PRINIVIL,ZESTRIL) 10 MG tablet Take 1 tablet (10 mg total) by mouth daily. 90 tablet 3  . pramipexole (MIRAPEX) 1 MG tablet TAKE 1 AND 1/2 TABLETS  BY MOUTH AT BEDTIME 45 tablet 6  . traMADol (ULTRAM) 50 MG tablet TAKE 1 TABLET EVERY 6 HOURS AS NEEDED 120 tablet 5   No current facility-administered medications for this visit.    Allergies  Allergen Reactions  . Antihistamines, Loratadine-Type   . Statins     REACTION: liver effects    Past Medical History  Diagnosis Date  . Hypertension   . Hyperlipidemia   . CAD (coronary artery disease)     sees Dr. [name]   . BPH with urinary obstruction   . Restless legs syndrome   . Myocardial infarction ([place])   . GERD (gastroesophageal reflux disease)   . Tubular adenoma of colon   . Chronic low back pain     sees Dr. Kary Kos   . IBS (irritable bowel syndrome)   . Cervical spondylosis without myelopathy 2020/09/1616    Assessment and Plan 1.  Hyperlipidemia- Pt's LDL has remained stable over the past year on Repatha.  He is tolerating medication.  Discussed low LDL and that we have not seen any adverse events with LDLs below 30.  He is comfortable where his panel is at this time.  Will  continue current therapy.  No further follow up with Lipid Clinic required at this time.   Elberta Leatherwood PharmD CPP [facility]

## 2016-05-29 ENCOUNTER — Telehealth: Payer: Self-pay | Admitting: Pharmacist

## 2016-05-29 NOTE — Telephone Encounter (Signed)
Pt was just seen in lipid clinic yesterday and reported no issues with his Repatha or copay. Called back today to tell us that his copay is $490 per month and that he cannot afford injections any longer. Unfortunately, this is a standard Medicare copay for the drug and we do not have any other way to help pt with copay. Discussed CLEAR trial with patient that he may potentially qualify for. He is interested in learning more. Will route this note to [place] with the research group to contact pt with more information. Of note, pt will need to have lipid panel rechecked in ~2 months after Repatha has washed out of his system since current LDL on drug is 25.

## 2016-06-01 ENCOUNTER — Telehealth: Payer: Self-pay | Admitting: Cardiology

## 2016-06-01 NOTE — Telephone Encounter (Signed)
New Message:  Charles Coffey was calling in to inform Dr. Aundra Dubin that the financial grant given to the pt the receive his Repatha has run out and the copay without this grant will increase and most likely be too much for the pt to afford. Charles Coffey was wanting to know if there are any further suggestions as far as Statin  Medications. Please f/u with her.

## 2016-06-01 NOTE — Telephone Encounter (Signed)
Returned phone call to [facility] and advised no other statin options. See notes from 6/1 and 6/2 for details regarding pt's lipid hx.

## 2016-06-16 ENCOUNTER — Other Ambulatory Visit: Payer: Self-pay | Admitting: Family Medicine

## 2016-07-14 ENCOUNTER — Other Ambulatory Visit: Payer: Self-pay | Admitting: Family Medicine

## 2016-07-15 NOTE — Telephone Encounter (Signed)
Ok to refill 

## 2016-09-03 ENCOUNTER — Telehealth: Payer: Self-pay | Admitting: Family Medicine

## 2016-09-03 DIAGNOSIS — H9193 Unspecified hearing loss, bilateral: Secondary | ICD-10-CM
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
## 2016-09-04 NOTE — Telephone Encounter (Signed)
Referral was done  

## 2016-09-04 NOTE — Telephone Encounter (Signed)
I spoke with pt and gave below message.  

## 2016-09-26 ENCOUNTER — Other Ambulatory Visit: Payer: Self-pay | Admitting: Family Medicine

## 2016-09-29 NOTE — Telephone Encounter (Signed)
Call in #120 with 5 rf 

## 2016-11-13 ENCOUNTER — Telehealth: Payer: Self-pay

## 2016-11-13 NOTE — Telephone Encounter (Signed)
Call to Charles Coffey regarding apt this year Will see Dr. Sarajane Jews on 12/27 at 1:30 Is coming in for lab work on 12/20 at 8: 15; I will see from 8am to 9 after his labs for AWV

## 2016-12-01 ENCOUNTER — Other Ambulatory Visit (INDEPENDENT_AMBULATORY_CARE_PROVIDER_SITE_OTHER): Payer: PPO

## 2016-12-01 DIAGNOSIS — Z0000 Encounter for general adult medical examination without abnormal findings: Secondary | ICD-10-CM | POA: Diagnosis not present

## 2016-12-01 LAB — BASIC METABOLIC PANEL
BUN: 15 mg/dL (ref 6–23)
CHLORIDE: 107 meq/L (ref 96–112)
CO2: 24 mEq/L (ref 19–32)
Calcium: 9.3 mg/dL (ref 8.4–10.5)
Creatinine, Ser: 1.09 mg/dL (ref 0.40–1.50)
GFR: 69.24 mL/min (ref >60.00)
Glucose, Bld: 106 mg/dL — ABNORMAL HIGH (ref 70–99)
POTASSIUM: 4.4 meq/L (ref 3.5–5.1)
SODIUM: 139 meq/L (ref 135–145)

## 2016-12-01 LAB — CBC WITH DIFFERENTIAL/PLATELET
BASOS ABS: 0.1 10*3/uL (ref 0.0–0.1)
Basophils Relative: 1 % (ref 0.0–3.0)
EOS ABS: 0.1 10*3/uL (ref 0.0–0.7)
Eosinophils Relative: 2.3 % (ref 0.0–5.0)
HCT: 31.1 % — ABNORMAL LOW (ref 39.0–52.0)
Hemoglobin: 10.7 g/dL — ABNORMAL LOW (ref 13.0–17.0)
LYMPHS ABS: 1.6 10*3/uL (ref 0.7–4.0)
Lymphocytes Relative: 26.6 % (ref 12.0–46.0)
MCHC: 34.3 g/dL (ref 30.0–36.0)
MCV: 93.6 fl (ref 78.0–100.0)
MONO ABS: 0.5 10*3/uL (ref 0.1–1.0)
Monocytes Relative: 8.4 % (ref 3.0–12.0)
NEUTROS PCT: 61.7 % (ref 43.0–77.0)
Neutro Abs: 3.7 10*3/uL (ref 1.4–7.7)
PLATELETS: 107 10*3/uL — AB (ref 150.0–400.0)
RBC: 3.33 Mil/uL — ABNORMAL LOW (ref 4.22–5.81)
RDW: 18 % — ABNORMAL HIGH (ref 11.5–15.5)
WBC: 6 10*3/uL (ref 4.0–10.5)

## 2016-12-01 LAB — TSH: TSH: 1.48 u[IU]/mL (ref 0.35–4.50)

## 2016-12-01 LAB — POC URINALSYSI DIPSTICK (AUTOMATED)
Bilirubin, UA: NEGATIVE
Blood, UA: NEGATIVE
GLUCOSE UA: NEGATIVE
KETONES UA: NEGATIVE
LEUKOCYTES UA: NEGATIVE
Nitrite, UA: NEGATIVE
Protein, UA: NEGATIVE
SPEC GRAV UA: 1.01
UROBILINOGEN UA: 0.2
pH, UA: 5.5

## 2016-12-01 LAB — LIPID PANEL
CHOL/HDL RATIO: 3
Cholesterol: 135 mg/dL (ref 0–200)
HDL: 41.6 mg/dL (ref >39.00)
LDL Cholesterol: 76 mg/dL (ref 0–99)
NONHDL: 93.4
TRIGLYCERIDES: 85 mg/dL (ref 0.0–149.0)
VLDL: 17 mg/dL (ref 0.0–40.0)

## 2016-12-01 LAB — HEPATIC FUNCTION PANEL
ALK PHOS: 40 U/L (ref 39–117)
ALT: 15 U/L (ref 0–53)
AST: 15 U/L (ref 0–37)
Albumin: 4.1 g/dL (ref 3.5–5.2)
BILIRUBIN DIRECT: 0.1 mg/dL (ref 0.0–0.3)
Total Bilirubin: 0.6 mg/dL (ref 0.2–1.2)
Total Protein: 6.4 g/dL (ref 6.0–8.3)

## 2016-12-01 LAB — PSA: PSA: 7.5 ng/mL — ABNORMAL HIGH (ref 0.10–4.00)

## 2016-12-03 ENCOUNTER — Other Ambulatory Visit: Payer: Self-pay | Admitting: Neurosurgery

## 2016-12-03 DIAGNOSIS — M4807 Spinal stenosis, lumbosacral region: Secondary | ICD-10-CM

## 2016-12-07 ENCOUNTER — Telehealth: Payer: Self-pay | Admitting: Family Medicine

## 2016-12-07 NOTE — Telephone Encounter (Signed)
His recent labs were significant for mild anemia and an elevated PSA to 7.5. He had been seeing Dr. [name] in Urology. Please make sure he is still following up with Dr. [name].

## 2016-12-09 NOTE — Telephone Encounter (Signed)
I spoke with pt went over below information, also pt does have upcoming appointment with Urology in 12/16/16.

## 2016-12-16 ENCOUNTER — Ambulatory Visit: Payer: PPO

## 2016-12-16 ENCOUNTER — Other Ambulatory Visit: Payer: PPO
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
## 2016-12-23 ENCOUNTER — Encounter: Payer: PPO | Admitting: Family Medicine

## 2016-12-25 ENCOUNTER — Encounter: Payer: Self-pay | Admitting: Family Medicine

## 2016-12-25 ENCOUNTER — Ambulatory Visit (INDEPENDENT_AMBULATORY_CARE_PROVIDER_SITE_OTHER): Payer: PPO | Admitting: Family Medicine

## 2016-12-25 VITALS — BP 113/58 | HR 58 | Temp 98.1°F | Ht 68.0 in | Wt 203.0 lb

## 2016-12-25 DIAGNOSIS — Z0000 Encounter for general adult medical examination without abnormal findings: Secondary | ICD-10-CM

## 2016-12-25 NOTE — Progress Notes (Signed)
   Subjective:    Patient ID: Charles Coffey, male    DOB: April 07, 1935, 81 y.o.   MRN: [id number]  HPI 81 yr old male for a well exam. He feels fine except for some lingering URI symptoms that started 4 weeks ago. He has had PND, stuffy head, and a dry cough. He has seen Urgent Care twice and was given first Amoxicillin and then a Zpack, but nothing has helped much. In general he is getting slowly better. We noted an elevated PSA to over 7, but he has seen Dr. [name] regularly, and this has been stable apparently for several years. He will see Dr. [name] again in February.    Review of Systems  Constitutional: Negative.   HENT: Positive for congestion and postnasal drip. Negative for ear pain, sinus pain, sinus pressure, sore throat and voice change.   Eyes: Negative.   Respiratory: Positive for cough. Negative for apnea, choking, chest tightness, shortness of breath, wheezing and stridor.   Cardiovascular: Negative.   Gastrointestinal: Negative.   Genitourinary: Negative.   Musculoskeletal: Negative.   Skin: Negative.   Neurological: Negative.   Psychiatric/Behavioral: Negative.        Objective:   Physical Exam  Constitutional: He is oriented to person, place, and time. He appears well-developed and well-nourished. No distress.  HENT:  Head: Normocephalic and atraumatic.  Right Ear: External ear normal.  Left Ear: External ear normal.  Nose: Nose normal.  Mouth/Throat: Oropharynx is clear and moist. No oropharyngeal exudate.  Eyes: Conjunctivae and EOM are normal. Pupils are equal, round, and reactive to light. Right eye exhibits no discharge. Left eye exhibits no discharge. No scleral icterus.  Neck: Neck supple. No JVD present. No tracheal deviation present. No thyromegaly present.  Cardiovascular: Normal rate, regular rhythm, normal heart sounds and intact distal pulses.  Exam reveals no gallop and no friction rub.   No murmur heard. Pulmonary/Chest: Effort normal and breath  sounds normal. No respiratory distress. He has no wheezes. He has no rales. He exhibits no tenderness.  Abdominal: Soft. Bowel sounds are normal. He exhibits no distension and no mass. There is no tenderness. There is no rebound and no guarding.  Musculoskeletal: Normal range of motion. He exhibits no edema or tenderness.  Lymphadenopathy:    He has no cervical adenopathy.  Neurological: He is alert and oriented to person, place, and time. He has normal reflexes. No cranial nerve deficit. He exhibits normal muscle tone. Coordination normal.  Skin: Skin is warm and dry. No rash noted. He is not diaphoretic. No erythema. No pallor.  Psychiatric: He has a normal mood and affect. His behavior is normal. Judgment and thought content normal.          Assessment & Plan:  Well exam. We discussed diet and exercise. He seems to have a viral URI and this should resolve soon. He will follow up with Urology as scheduled.  Alysia Penna, MD

## 2016-12-25 NOTE — Progress Notes (Signed)
Pre visit review using our clinic review tool, if applicable. No additional management support is needed unless otherwise documented below in the visit note. 

## 2016-12-26 ENCOUNTER — Ambulatory Visit
Admission: RE | Admit: 2016-12-26 | Discharge: 2016-12-26 | Disposition: A | Discharge status: Home / Self Care | Payer: PPO | Source: Ambulatory Visit | Attending: Neurosurgery | Admitting: Neurosurgery

## 2016-12-26 DIAGNOSIS — M4807 Spinal stenosis, lumbosacral region: Secondary | ICD-10-CM

## 2017-01-05 ENCOUNTER — Other Ambulatory Visit: Payer: Self-pay | Admitting: Neurosurgery

## 2017-01-05 DIAGNOSIS — M5136 Other intervertebral disc degeneration, lumbar region: Secondary | ICD-10-CM | POA: Diagnosis not present

## 2017-01-05 DIAGNOSIS — M4807 Spinal stenosis, lumbosacral region: Secondary | ICD-10-CM | POA: Diagnosis not present

## 2017-01-05 DIAGNOSIS — Z6830 Body mass index (BMI) 30.0-30.9, adult: Secondary | ICD-10-CM | POA: Diagnosis not present

## 2017-01-05 DIAGNOSIS — M4316 Spondylolisthesis, lumbar region: Secondary | ICD-10-CM

## 2017-01-06 DIAGNOSIS — R69 Illness, unspecified: Secondary | ICD-10-CM | POA: Diagnosis not present

## 2017-01-12 ENCOUNTER — Ambulatory Visit
Admission: RE | Admit: 2017-01-12 | Discharge: 2017-01-12 | Disposition: A | Discharge status: Home / Self Care | Payer: Medicare HMO | Source: Ambulatory Visit | Attending: Neurosurgery | Admitting: Neurosurgery

## 2017-01-12 DIAGNOSIS — M4316 Spondylolisthesis, lumbar region: Secondary | ICD-10-CM

## 2017-01-12 DIAGNOSIS — M545 Low back pain: Secondary | ICD-10-CM | POA: Diagnosis not present

## 2017-01-12 MED ORDER — IOPAMIDOL (ISOVUE-M 200) INJECTION 41%
1.0000 mL | Freq: Once | INTRAMUSCULAR | Status: AC
  Administered 2017-01-12: 1 mL via INTRA_ARTICULAR

## 2017-01-12 MED ORDER — METHYLPREDNISOLONE ACETATE 40 MG/ML INJ SUSP (RADIOLOG
120.0000 mg | Freq: Once | INTRAMUSCULAR | Status: AC
  Administered 2017-01-12: 120 mg via INTRA_ARTICULAR

## 2017-01-12 NOTE — Discharge Instructions (Signed)

## 2017-01-26 ENCOUNTER — Encounter: Payer: Self-pay | Admitting: Family Medicine

## 2017-01-26 ENCOUNTER — Ambulatory Visit (INDEPENDENT_AMBULATORY_CARE_PROVIDER_SITE_OTHER): Payer: Medicare HMO | Admitting: Family Medicine

## 2017-01-26 VITALS — BP 125/77 | HR 65 | Temp 98.3°F | Ht 68.0 in | Wt 202.0 lb

## 2017-01-26 DIAGNOSIS — J40 Bronchitis, not specified as acute or chronic: Secondary | ICD-10-CM

## 2017-01-26 DIAGNOSIS — R972 Elevated prostate specific antigen [PSA]: Secondary | ICD-10-CM | POA: Diagnosis not present

## 2017-01-26 MED ORDER — AMOXICILLIN-POT CLAVULANATE 875-125 MG PO TABS: 1.0000 | ORAL_TABLET | Freq: Two times a day (BID) | ORAL | 0 refills | Status: DC

## 2017-01-26 NOTE — Progress Notes (Signed)
   Subjective:    Patient ID: Charles Coffey, male    DOB: 19-Mar-1935, 82 y.o.   MRN: [id number]  HPI Here with worsening URI symptoms. He had viral type coughing for a few weeks, but over the past 5 days his chest has become more congested and he is coughing up green sputum. No fever.    Review of Systems  Constitutional: Negative.   HENT: Negative.   Eyes: Negative.   Respiratory: Positive for cough. Negative for shortness of breath and wheezing.   Cardiovascular: Negative.        Objective:   Physical Exam  Constitutional: He appears well-developed and well-nourished.  HENT:  Right Ear: External ear normal.  Left Ear: External ear normal.  Nose: Nose normal.  Mouth/Throat: Oropharynx is clear and moist.  Eyes: Conjunctivae are normal.  Neck: No thyromegaly present.  Cardiovascular: Normal rate, regular rhythm, normal heart sounds and intact distal pulses.   Pulmonary/Chest: Effort normal. No respiratory distress. He has no wheezes. He has no rales.  Scattered rhonchi   Lymphadenopathy:    He has no cervical adenopathy.          Assessment & Plan:  He now has a bronchitis, we will treat it with Augmentin.  Alysia Penna, MD

## 2017-01-26 NOTE — Progress Notes (Signed)
Pre visit review using our clinic review tool, if applicable. No additional management support is needed unless otherwise documented below in the visit note. 

## 2017-02-02 DIAGNOSIS — I10 Essential (primary) hypertension: Secondary | ICD-10-CM | POA: Diagnosis not present

## 2017-02-02 DIAGNOSIS — Z6830 Body mass index (BMI) 30.0-30.9, adult: Secondary | ICD-10-CM | POA: Diagnosis not present

## 2017-02-02 DIAGNOSIS — M5137 Other intervertebral disc degeneration, lumbosacral region: Secondary | ICD-10-CM | POA: Diagnosis not present

## 2017-02-03 DIAGNOSIS — R350 Frequency of micturition: Secondary | ICD-10-CM | POA: Diagnosis not present

## 2017-02-03 DIAGNOSIS — N401 Enlarged prostate with lower urinary tract symptoms: Secondary | ICD-10-CM | POA: Diagnosis not present

## 2017-02-03 DIAGNOSIS — R3912 Poor urinary stream: Secondary | ICD-10-CM | POA: Diagnosis not present

## 2017-02-03 DIAGNOSIS — R972 Elevated prostate specific antigen [PSA]: Secondary | ICD-10-CM | POA: Diagnosis not present

## 2017-02-07 ENCOUNTER — Other Ambulatory Visit: Payer: Self-pay | Admitting: Internal Medicine

## 2017-02-08 ENCOUNTER — Telehealth: Payer: Self-pay | Admitting: Internal Medicine

## 2017-02-08 MED ORDER — LINACLOTIDE 290 MCG PO CAPS
ORAL_CAPSULE | ORAL | 1 refills | Status: DC
Start: 2017-02-08 — End: 2017-03-30

## 2017-02-08 NOTE — Telephone Encounter (Signed)
Rx sent 

## 2017-03-15 DIAGNOSIS — L719 Rosacea, unspecified: Secondary | ICD-10-CM | POA: Diagnosis not present

## 2017-03-15 DIAGNOSIS — L570 Actinic keratosis: Secondary | ICD-10-CM | POA: Diagnosis not present

## 2017-03-24 ENCOUNTER — Ambulatory Visit: Payer: Medicare HMO | Admitting: Internal Medicine

## 2017-03-30 ENCOUNTER — Ambulatory Visit (INDEPENDENT_AMBULATORY_CARE_PROVIDER_SITE_OTHER): Payer: Medicare HMO | Admitting: Internal Medicine

## 2017-03-30 ENCOUNTER — Encounter: Payer: Self-pay | Admitting: Internal Medicine

## 2017-03-30 VITALS — BP 112/60 | HR 60 | Ht 67.75 in | Wt 203.2 lb

## 2017-03-30 DIAGNOSIS — Z86010 Personal history of colonic polyps: Secondary | ICD-10-CM

## 2017-03-30 DIAGNOSIS — K581 Irritable bowel syndrome with constipation: Secondary | ICD-10-CM

## 2017-03-30 DIAGNOSIS — K219 Gastro-esophageal reflux disease without esophagitis: Secondary | ICD-10-CM

## 2017-03-30 MED ORDER — ESOMEPRAZOLE MAGNESIUM 40 MG PO CPDR: 40.0000 mg | DELAYED_RELEASE_CAPSULE | Freq: Every day | ORAL | 10 refills | Status: DC

## 2017-03-30 MED ORDER — LINACLOTIDE 290 MCG PO CAPS: ORAL_CAPSULE | ORAL | 10 refills | Status: DC

## 2017-03-30 NOTE — Patient Instructions (Signed)
We have sent the following medications to your pharmacy for you to pick up at your convenience: Linzess 290 mcg daily Nexium 40 mg daily  Please follow up with Dr Hilarie Fredrickson in the office in 1 year.  You will be due for a recall colonoscopy in 09/2017. We will send you a reminder in the mail when it gets closer to that time. There will be no need for an office visit prior. You can have a direct procedure.  If you are age 80 or older, your body mass index should be between 23-30. Your Body mass index is 31.13 kg/m. If this is out of the aforementioned range listed, please consider follow up with your Primary Care Provider.  If you are age 9 or younger, your body mass index should be between 19-25. Your Body mass index is 31.13 kg/m. If this is out of the aformentioned range listed, please consider follow up with your Primary Care Provider.

## 2017-03-30 NOTE — Progress Notes (Signed)
   Subjective:    Patient ID: Charles Coffey, male    DOB: November 27, 1935, 82 y.o.   MRN: [id number]  HPI Erby Sanderson is a 82 yo male with PMH of colon polyps, Chronic constipation with IBS, GERD who is here for follow-up. He was last seen in January 2017 and he presents alone today. He reports that he's been having on and off issue with reflux most recently at night. He's used over-the-counter Nexium 20 mg on and off for the last few months. He usually takes the 14 day course and then stops. Symptoms have returned slowly after stopping but are well-controlled when he uses the medication. He is found the need to take 40 mg daily for complete control. He had had some intermittent mild dysphagia which resolved and entirely with PPI. He denies dysphagia and odynophagia presently. No nausea or vomiting. Good appetite and no weight loss or early satiety. Without PPI he notes nocturnal earning in his chest and also sour brash. He continues Linzess 290 g daily which he states "works ligature arm". He is having daily formed bowel movements without diarrhea or constipation. No blood in his stool or melena.   Review of Systems As per HPI, otherwise negative  Current Medications, Allergies, Past Medical History, Past Surgical History, Family History and Social History were reviewed in [employer] record.     Objective:   Physical Exam BP 112/60 (BP Location: Left Arm, Patient Position: Sitting, Cuff Size: Normal)   Pulse 60   Ht 5' 7.75" (1.721 m)   Wt 203 lb 4 oz (92.2 kg)   BMI 31.13 kg/m  Constitutional: Well-developed and well-nourished. No distress. HEENT: Normocephalic and atraumatic. Conjunctivae are normal.  No scleral icterus. Neck: Neck supple. Trachea midline. Cardiovascular: Normal rate, regular rhythm and intact distal pulses. No M/R/G Pulmonary/chest: Effort normal and breath sounds normal. No wheezing, rales or rhonchi. Abdominal: Soft, nontender, nondistended.  Bowel sounds active throughout. There are no masses palpable. No hepatosplenomegaly. Extremities: no clubbing, cyanosis, or edema Neurological: Alert and oriented to person place and time. Skin: Skin is warm and dry.  Psychiatric: Normal mood and affect. Behavior is normal.      Assessment & Plan:  82 yo male with PMH of colon polyps, Chronic constipation with IBS, GERD who is here for follow-up.  1. IBS with chronic constipation -- excellent response to Linzess. Continue Linzess 290 g daily.  2. GERD -- worse at night with mild dysphagia. Dysphagia resolved completely with PPI. Will prescribe Nexium 40 mg daily. He is instructed to move this from at bedtime to 30 minutes before his first or last meal of the day. I've instructed him that he has any residual dysphagia on 40 mg of Nexium daily he needs to notify me and we will pursue endoscopy. He voices understanding.  3. History of colon polyps -- he is due surveillance colonoscopy later this year. He will be 82 later this month. We discussed discontinuation of screening based on age versus continuing screening/surveillance given his overall good health. After this discussion he prefers to proceed with screening/surveillance colonoscopy later this year. I feel this is appropriate. Recall will generate a letter at which time we will schedule colonoscopy for later this year.  25 minutes spent with the patient today. Greater than 50% was spent in counseling and coordination of care with the patient

## 2017-04-20 ENCOUNTER — Telehealth: Payer: Self-pay | Admitting: Internal Medicine

## 2017-04-21 NOTE — Telephone Encounter (Signed)
Pt states he is taking the esomeprazole and it is not working that well. Reports he is still having reflux issues especially when he goes to bed. States he has trouble lying down. Wonders if there is something else he can try. Please advise.

## 2017-04-21 NOTE — Telephone Encounter (Signed)
would change to pantoprazole 40 mg twice a day before meals 1 month. If GERD comes under complete control can decrease to 40 mg 30 minutes before breakfast daily At last office visit he mentioned mild esophageal dysphagia. If this is still present I would recommend we proceed to EGD given uncontrolled GERD. Can still proceed with pantoprazole as mentioned

## 2017-04-22 NOTE — Telephone Encounter (Signed)
Pt states he has tried protonix in the past and it did not do anything for him. Pt states last night he did not have any issues. Pt would like to continue taking the Nexium and see how things go, pt to call back and give [place] an update.

## 2017-04-23 MED ORDER — PANTOPRAZOLE SODIUM 40 MG PO TBEC: 40.0000 mg | DELAYED_RELEASE_TABLET | Freq: Two times a day (BID) | ORAL | 3 refills | Status: DC

## 2017-04-23 NOTE — Addendum Note (Signed)
Addended by: Rosanne Sack R on: 04/23/2017 03:19 PM   Modules accepted: Orders

## 2017-04-23 NOTE — Telephone Encounter (Signed)
Pt called back and would like the protonix called in , states the nexium just isnt helping. Script sent to pharmacy.

## 2017-05-02 ENCOUNTER — Other Ambulatory Visit: Payer: Self-pay | Admitting: Cardiology

## 2017-05-05 ENCOUNTER — Other Ambulatory Visit: Payer: Self-pay | Admitting: Cardiology

## 2017-05-07 NOTE — Telephone Encounter (Signed)
This patient PCP is Dr Alysia Penna @ [facility]. Not a heart failure clinic patient of Dr Aundra Dubin

## 2017-05-10 ENCOUNTER — Telehealth: Payer: Self-pay | Admitting: Internal Medicine

## 2017-05-10 ENCOUNTER — Other Ambulatory Visit: Payer: Self-pay

## 2017-05-10 DIAGNOSIS — M4807 Spinal stenosis, lumbosacral region: Secondary | ICD-10-CM | POA: Diagnosis not present

## 2017-05-10 DIAGNOSIS — M5137 Other intervertebral disc degeneration, lumbosacral region: Secondary | ICD-10-CM | POA: Diagnosis not present

## 2017-05-10 MED ORDER — DEXLANSOPRAZOLE 60 MG PO CPDR: 60.0000 mg | DELAYED_RELEASE_CAPSULE | Freq: Every day | ORAL | 3 refills | Status: DC

## 2017-05-10 NOTE — Telephone Encounter (Signed)
Message routed to Dr. Hilarie Fredrickson, please advise.

## 2017-05-10 NOTE — Telephone Encounter (Signed)
Pt has tried nexium and now protonix  Would try dexilant 60 mg before breakfast (noting failures of prior PPIs) Can use ranitidine 150 mg qhs PRN for breakthrough EGD is recommended given ongoing symptoms not responding to PPI.  Please schedule EGD for [facility] Thanks

## 2017-05-10 NOTE — Telephone Encounter (Signed)
Patient advised of Dr. Vena Rua recommendations, will stop taking the other PPI. Sent in new Rx for the Dexilant 60 mg, advised can still use the Zantac 150 mg qhs prn. Patient is scheduled for pre-visit and EGD.

## 2017-05-17 ENCOUNTER — Ambulatory Visit (AMBULATORY_SURGERY_CENTER): Payer: Self-pay

## 2017-05-17 VITALS — Ht 68.0 in | Wt 204.0 lb

## 2017-05-17 DIAGNOSIS — I252 Old myocardial infarction: Secondary | ICD-10-CM

## 2017-05-17 NOTE — Progress Notes (Signed)
Denies allergies to eggs or soy products. Denies complication of anesthesia or sedation. Denies use of weight loss medication. Denies use of O2.   Emmi instructions given for colonoscopy.  

## 2017-05-20 ENCOUNTER — Other Ambulatory Visit: Payer: Self-pay | Admitting: Neurosurgery

## 2017-05-20 DIAGNOSIS — M4316 Spondylolisthesis, lumbar region: Secondary | ICD-10-CM

## 2017-05-25 ENCOUNTER — Encounter: Payer: Self-pay | Admitting: Internal Medicine

## 2017-05-26 ENCOUNTER — Telehealth: Payer: Self-pay | Admitting: Internal Medicine

## 2017-05-26 NOTE — Telephone Encounter (Signed)
Pt states the dexilant has been working. Asking for a sooner EGD appt. Pts appt moved to 05/28/17@9am , pt to arrive there at 8am. Pt aware of appt.

## 2017-05-28 ENCOUNTER — Ambulatory Visit (AMBULATORY_SURGERY_CENTER): Payer: Medicare HMO | Admitting: Internal Medicine

## 2017-05-28 ENCOUNTER — Encounter: Payer: Self-pay | Admitting: Internal Medicine

## 2017-05-28 ENCOUNTER — Other Ambulatory Visit: Payer: Medicare HMO

## 2017-05-28 VITALS — BP 105/56 | HR 54 | Temp 97.5°F | Resp 12 | Ht 67.5 in | Wt 203.0 lb

## 2017-05-28 DIAGNOSIS — K259 Gastric ulcer, unspecified as acute or chronic, without hemorrhage or perforation: Secondary | ICD-10-CM | POA: Diagnosis not present

## 2017-05-28 DIAGNOSIS — K2950 Unspecified chronic gastritis without bleeding: Secondary | ICD-10-CM | POA: Diagnosis not present

## 2017-05-28 DIAGNOSIS — I10 Essential (primary) hypertension: Secondary | ICD-10-CM | POA: Diagnosis not present

## 2017-05-28 DIAGNOSIS — K219 Gastro-esophageal reflux disease without esophagitis: Secondary | ICD-10-CM

## 2017-05-28 DIAGNOSIS — I252 Old myocardial infarction: Secondary | ICD-10-CM | POA: Diagnosis not present

## 2017-05-28 DIAGNOSIS — I255 Ischemic cardiomyopathy: Secondary | ICD-10-CM | POA: Diagnosis not present

## 2017-05-28 MED ORDER — SODIUM CHLORIDE 0.9 % IV SOLN: 500.0000 mL | INTRAVENOUS | Status: DC

## 2017-05-28 MED ORDER — RANITIDINE HCL 150 MG PO TABS: ORAL_TABLET | ORAL | 11 refills | Status: DC

## 2017-05-28 NOTE — Progress Notes (Signed)
Pt's states no medical or surgical changes since previsit or office visit. 

## 2017-05-28 NOTE — Op Note (Signed)
[facility] Patient Name: Charles Coffey Procedure Date: 05/28/2017 8:10 AM MRN: [id number] Endoscopist: Jerene Bears , MD Age: 82 Referring MD:  Date of Birth: [birth year] Gender: Male Account #: [id number] Procedure:                Upper GI endoscopy Indications:              Heartburn, mild and intermittent dysphagia Medicines:                Monitored Anesthesia Care Procedure:                Pre-Anesthesia Assessment:                           - Prior to the procedure, a History and Physical                            was performed, and patient medications and                            allergies were reviewed. The patient's tolerance of                            previous anesthesia was also reviewed. The risks                            and benefits of the procedure and the sedation                            options and risks were discussed with the patient.                            All questions were answered, and informed consent                            was obtained. Prior Anticoagulants: The patient has                            taken no previous anticoagulant or antiplatelet                            agents. ASA Grade Assessment: III - A patient with                            severe systemic disease. After reviewing the risks                            and benefits, the patient was deemed in                            satisfactory condition to undergo the procedure.                           After obtaining informed consent, the endoscope was  passed under direct vision. Throughout the                            procedure, the patient's blood pressure, pulse, and                            oxygen saturations were monitored continuously. The                            Endoscope was introduced through the mouth, and                            advanced to the second part of duodenum. The upper                            GI endoscopy  was accomplished without difficulty.                            The patient tolerated the procedure well. Scope In: Scope Out: Findings:                 The examined esophagus was normal.                           A small hiatal hernia was present.                           Two localized, small non-bleeding erosions were                            found in the cardia just distal to GE junction.                            Biopsies were taken with a cold forceps for                            histology and Helicobacter pylori testing.                           The gastric body, gastric antrum and pylorus were                            normal.                           The examined duodenum was normal. Complications:            No immediate complications. Estimated Blood Loss:     Estimated blood loss was minimal. Impression:               - Normal esophagus.                           - Small hiatal hernia.                           - Non-bleeding Cameron's  erosions in the gastric                            cardia (benign-appearing). Biopsied.                           - Normal gastric body, antrum and pylorus.                           - Normal examined duodenum. Recommendation:           - Patient has a contact number available for                            emergencies. The signs and symptoms of potential                            delayed complications were discussed with the                            patient. Return to normal activities tomorrow.                            Written discharge instructions were provided to the                            patient.                           - Resume previous diet.                           - Continue present medications including Dexilant                            60 mg daily (before breakfast). Raniditine 150 mg                            can be used if needed at night for breakthrough                            heartburn.                            - Await pathology results. Jerene Bears, MD 05/28/2017 9:03:22 AM This report has been signed electronically.

## 2017-05-28 NOTE — Patient Instructions (Signed)
YOU HAD AN ENDOSCOPIC PROCEDURE TODAY AT [facility]:   Refer to the procedure report that was given to you for any specific questions about what was found during the examination.  If the procedure report does not answer your questions, please call your gastroenterologist to clarify.  If you requested that your care partner not be given the details of your procedure findings, then the procedure report has been included in a sealed envelope for you to review at your convenience later.  YOU SHOULD EXPECT: Some feelings of bloating in the abdomen. Passage of more gas than usual.  Walking can help get rid of the air that was put into your GI tract during the procedure and reduce the bloating. If you had a lower endoscopy (such as a colonoscopy or flexible sigmoidoscopy) you may notice spotting of blood in your stool or on the toilet paper. If you underwent a bowel prep for your procedure, you may not have a normal bowel movement for a few days.  Please Note:  You might notice some irritation and congestion in your nose or some drainage.  This is from the oxygen used during your procedure.  There is no need for concern and it should clear up in a day or so.  SYMPTOMS TO REPORT IMMEDIATELY:   Following upper endoscopy (EGD)  Vomiting of blood or coffee ground material  New chest pain or pain under the shoulder blades  Painful or persistently difficult swallowing  New shortness of breath  Fever of 100F or higher  Black, tarry-looking stools  For urgent or emergent issues, a gastroenterologist can be reached at any hour by calling [phone].   DIET:  We do recommend a small meal at first, but then you may proceed to your regular diet.  Drink plenty of fluids but you should avoid alcoholic beverages for 24 hours.  ACTIVITY:  You should plan to take it easy for the rest of today and you should NOT DRIVE or use heavy machinery until tomorrow (because of the sedation medicines used  during the test).    FOLLOW UP: Our staff will call the number listed on your records the next business day following your procedure to check on you and address any questions or concerns that you may have regarding the information given to you following your procedure. If we do not reach you, we will leave a message.  However, if you are feeling well and you are not experiencing any problems, there is no need to return our call.  We will assume that you have returned to your regular daily activities without incident.  If any biopsies were taken you will be contacted by phone or by letter within the next 1-3 weeks.  Please call us at [phone] if you have not heard about the biopsies in 3 weeks.  Await for biopsy results Continue with Dexilant  60 mg daily before breakfast Zantac (Raniditine) 150 mg  every night Gastritis (handout given) Anti Reflux / Hiatal hernia (handout given)    SIGNATURES/CONFIDENTIALITY: You and/or your care partner have signed paperwork which will be entered into your electronic medical record.  These signatures attest to the fact that that the information above on your After Visit Summary has been reviewed and is understood.  Full responsibility of the confidentiality of this discharge information lies with you and/or your care-partner.

## 2017-05-28 NOTE — Progress Notes (Signed)
To recovery, report to Jones, RN, VSS 

## 2017-05-28 NOTE — Progress Notes (Signed)
Called to room to assist during endoscopic procedure.  Patient ID and intended procedure confirmed with present staff. Received instructions for my participation in the procedure from the performing physician.  

## 2017-05-30 ENCOUNTER — Other Ambulatory Visit: Payer: Self-pay | Admitting: Family Medicine

## 2017-05-31 ENCOUNTER — Other Ambulatory Visit: Payer: Self-pay | Admitting: Cardiology

## 2017-05-31 ENCOUNTER — Telehealth: Payer: Self-pay | Admitting: *Deleted

## 2017-05-31 NOTE — Telephone Encounter (Signed)
  Follow up Call-  Call back number 05/28/2017 10/17/2014  Post procedure Call Back phone  # [phone]  Permission to leave phone message Yes Yes  Some recent data might be hidden     Patient questions:  Do you have a fever, pain , or abdominal swelling? No. Pain Score  0 *  Have you tolerated food without any problems? Yes.    Have you been able to return to your normal activities? Yes.    Do you have any questions about your discharge instructions: Diet   No. Medications  No. Follow up visit  No.  Do you have questions or concerns about your Care? No.  Actions: * If pain score is 4 or above: No action needed, pain <4.

## 2017-05-31 NOTE — Telephone Encounter (Signed)
Call in #120 with 5 rf 

## 2017-06-01 ENCOUNTER — Ambulatory Visit
Admission: RE | Admit: 2017-06-01 | Discharge: 2017-06-01 | Disposition: A | Discharge status: Home / Self Care | Payer: Medicare HMO | Source: Ambulatory Visit | Attending: Neurosurgery | Admitting: Neurosurgery

## 2017-06-01 ENCOUNTER — Other Ambulatory Visit: Payer: Self-pay | Admitting: Neurosurgery

## 2017-06-01 ENCOUNTER — Encounter: Payer: Self-pay | Admitting: Internal Medicine

## 2017-06-01 DIAGNOSIS — M4316 Spondylolisthesis, lumbar region: Secondary | ICD-10-CM

## 2017-06-01 DIAGNOSIS — M47817 Spondylosis without myelopathy or radiculopathy, lumbosacral region: Secondary | ICD-10-CM | POA: Diagnosis not present

## 2017-06-01 MED ORDER — IOPAMIDOL (ISOVUE-M 200) INJECTION 41%
1.0000 mL | Freq: Once | INTRAMUSCULAR | Status: AC
  Administered 2017-06-01: 1 mL via INTRA_ARTICULAR

## 2017-06-01 MED ORDER — METHYLPREDNISOLONE ACETATE 40 MG/ML INJ SUSP (RADIOLOG
120.0000 mg | Freq: Once | INTRAMUSCULAR | Status: AC
  Administered 2017-06-01: 120 mg via INTRA_ARTICULAR

## 2017-06-01 NOTE — Discharge Instructions (Signed)

## 2017-06-07 ENCOUNTER — Telehealth: Payer: Self-pay | Admitting: Internal Medicine

## 2017-06-07 NOTE — Telephone Encounter (Signed)
Left message for pt to call back  °

## 2017-06-07 NOTE — Telephone Encounter (Signed)
Would continue Dexilant 60 mg once daily, 30 min. before breakfast Add ranitidine 300 mg each evening, rather than bedtime Perform upper GI series (barium esophagram plus evaluation of stomach) These studies would be necessary before consideration of hiatal hernia repair GERD diet with reflux precautions In the event of further breakthrough symptoms can do ranitidine 300 mg twice a day plus Dexilant once daily

## 2017-06-07 NOTE — Telephone Encounter (Signed)
Pt states the medication was working for a while until Friday evening. States he had lots of epigastric pain and reflux and had to take more Dexilant than what was prescribed. Reports he started taking Dexilant 60mg  BID to get things under control. He reports he is also taking the zantac 150mg  at bedtime. Pt wants to know what Dr. Hilarie Fredrickson suggests. Pt also wants to know the possibility of surgery for this and what are the chances it would work. Please advise.

## 2017-06-08 ENCOUNTER — Encounter: Payer: Medicare HMO | Admitting: Internal Medicine

## 2017-06-08 ENCOUNTER — Other Ambulatory Visit: Payer: Self-pay

## 2017-06-08 DIAGNOSIS — K219 Gastro-esophageal reflux disease without esophagitis: Secondary | ICD-10-CM

## 2017-06-08 DIAGNOSIS — K259 Gastric ulcer, unspecified as acute or chronic, without hemorrhage or perforation: Secondary | ICD-10-CM

## 2017-06-08 MED ORDER — RANITIDINE HCL 150 MG PO TABS: ORAL_TABLET | ORAL | 2 refills | Status: DC

## 2017-06-08 MED ORDER — RANITIDINE HCL 300 MG PO TABS: ORAL_TABLET | ORAL | 2 refills | Status: DC

## 2017-06-08 NOTE — Telephone Encounter (Signed)
Scheduled the patient and changed the medication. Patient declines this plan. He has "googled" the diet for hiatal hernia. States he has been eating wrong. He would like to make the dietary changes first. He will notify us of any failure to improve or acute worsening of the symptoms.

## 2017-06-14 ENCOUNTER — Telehealth: Payer: Self-pay | Admitting: Internal Medicine

## 2017-06-14 MED ORDER — RANITIDINE HCL 300 MG PO TABS: 300.0000 mg | ORAL_TABLET | Freq: Two times a day (BID) | ORAL | 3 refills | Status: DC

## 2017-06-14 NOTE — Telephone Encounter (Signed)
Pt called back and wanted to proceed with Dr. Vena Rua suggestions from Friday, see note. Pt scheduled for upper GI series at [facility] 06/21/17@8am , he is to arrive there at 7:45am and be NPO after midnight. Script for zantac sent to pharmacy for pt.

## 2017-06-15 ENCOUNTER — Ambulatory Visit (HOSPITAL_COMMUNITY): Payer: Medicare HMO

## 2017-06-21 ENCOUNTER — Other Ambulatory Visit: Payer: Self-pay | Admitting: Internal Medicine

## 2017-06-21 ENCOUNTER — Ambulatory Visit (HOSPITAL_COMMUNITY)
Admission: RE | Admit: 2017-06-21 | Discharge: 2017-06-21 | Disposition: A | Discharge status: Home / Self Care | Payer: Medicare HMO | Source: Ambulatory Visit | Attending: Internal Medicine | Admitting: Internal Medicine

## 2017-06-21 DIAGNOSIS — K449 Diaphragmatic hernia without obstruction or gangrene: Secondary | ICD-10-CM | POA: Insufficient documentation

## 2017-06-21 DIAGNOSIS — K259 Gastric ulcer, unspecified as acute or chronic, without hemorrhage or perforation: Secondary | ICD-10-CM

## 2017-06-21 DIAGNOSIS — R1312 Dysphagia, oropharyngeal phase: Secondary | ICD-10-CM | POA: Diagnosis not present

## 2017-06-21 DIAGNOSIS — I700 Atherosclerosis of aorta: Secondary | ICD-10-CM | POA: Insufficient documentation

## 2017-06-21 DIAGNOSIS — K219 Gastro-esophageal reflux disease without esophagitis: Secondary | ICD-10-CM

## 2017-06-21 DIAGNOSIS — M4186 Other forms of scoliosis, lumbar region: Secondary | ICD-10-CM | POA: Insufficient documentation

## 2017-06-26 ENCOUNTER — Other Ambulatory Visit: Payer: Self-pay | Admitting: Family Medicine

## 2017-06-28 NOTE — Telephone Encounter (Signed)
Can we refill this? 

## 2017-07-02 ENCOUNTER — Other Ambulatory Visit: Payer: Self-pay | Admitting: Cardiology

## 2017-07-13 ENCOUNTER — Encounter: Payer: Self-pay | Admitting: *Deleted

## 2017-07-20 ENCOUNTER — Ambulatory Visit (INDEPENDENT_AMBULATORY_CARE_PROVIDER_SITE_OTHER): Payer: Medicare HMO | Admitting: Internal Medicine

## 2017-07-20 ENCOUNTER — Encounter: Payer: Self-pay | Admitting: Internal Medicine

## 2017-07-20 VITALS — BP 120/70 | HR 60 | Ht 67.5 in | Wt 195.5 lb

## 2017-07-20 DIAGNOSIS — K581 Irritable bowel syndrome with constipation: Secondary | ICD-10-CM | POA: Diagnosis not present

## 2017-07-20 DIAGNOSIS — K449 Diaphragmatic hernia without obstruction or gangrene: Secondary | ICD-10-CM | POA: Diagnosis not present

## 2017-07-20 DIAGNOSIS — K219 Gastro-esophageal reflux disease without esophagitis: Secondary | ICD-10-CM

## 2017-07-20 NOTE — Progress Notes (Signed)
Subjective:    Patient ID: Charles Coffey, male    DOB: 08/01/1935, 82 y.o.   MRN: [id number]  HPI Charles Coffey is an 82 year old male with a history of GERD with small hiatal hernia with Cameron's type erosions, history of constipation with IBS, history of colon polyps who is here for follow-up. He was last seen at the time of his upper endoscopy performed on 05/28/2017. This showed a small hiatal hernia and to localized erosions in the gastric cardia near the GE junction. These were biopsied and negative for dysplasia. There was minimal chronic inflammation. No H. pylori. The remaining examination was normal. We followed this due to persistent symptoms of heartburn and intermittent dysphagia with upper GI series which showed small type I hiatal hernia with occasional secondary and tertiary contractions in the distal esophagus without overt dysmotility.  He has been maintained on Dexilant 60 mg each morning and ranitidine 300 mg twice a day. With this he reports he is significantly improved from a reflux and epigastric discomfort standpoint. He denies recent heartburn and dysphagia. Occasionally he will notice a pain substernal radiating across the chest. He attributes this to reflux and notes it does not feel like angina or prior MI. He notices this primarily with walking often after eating. This resolved with rest. Seems to happen at random. He knows that certain foods like coffee and wine trigger his reflux symptoms.  From a constipation perspective using Linzess 290 g daily. He is having a bowel movement once per day. If he skips Linzess he often feels constipation with abdominal bloating. He also has noted that he needs to separate Dexilant and Linzess for efficacy of both. No blood in his stool or melena.  He is interested in consideration of antireflux surgery to better control his symptoms and hopefully reduce his medication burden.  Review of Systems As per history of present illness,  otherwise negative  Current Medications, Allergies, Past Medical History, Past Surgical History, Family History and Social History were reviewed in [employer] record.     Objective:   Physical Exam BP 120/70   Pulse 60   Ht 5' 7.5" (1.715 m)   Wt 195 lb 8 oz (88.7 kg)   BMI 30.17 kg/m  Constitutional: Well-developed and well-nourished. No distress. HEENT: Normocephalic and atraumatic. Oropharynx is clear and moist. Conjunctivae are normal.  No scleral icterus. Neck: Neck supple. Trachea midline. Cardiovascular: Normal rate, regular rhythm and intact distal pulses. No M/R/G Pulmonary/chest: Effort normal and breath sounds normal. No wheezing, rales or rhonchi. Abdominal: Soft, nontender, nondistended. Bowel sounds active throughout. There are no masses palpable. No hepatosplenomegaly. Extremities: no clubbing, cyanosis, or edema Neurological: Alert and oriented to person place and time. Skin: Skin is warm and dry. Psychiatric: Normal mood and affect. Behavior is normal.        Assessment & Plan:  82 year old male with a history of GERD with small hiatal hernia with Cameron's type erosions, history of constipation with IBS, history of colon polyps who is here for follow-up.  1. GERD with hiatal hernia -- his symptoms are significantly more controlled with high-dose acid suppression. He is interested in discussing Nissen fundoplication and to determine if he would be a candidate for antireflux surgery. I will refer him to Dr. Zella Richer. Continue Dexilant 60 mg every morning and ranitidine 300 mg twice a day.  2. Chest pain -- certainly some of his pain, perhaps all, is related to regurgitation reflux. However he is on maximal  acid suppression therapy and has a history of coronary artery disease. For this reason I've encouraged him to contact Dr. Aundra Dubin for follow-up and he voices understanding. He will contact Dr. Aundra Dubin today.  3. IBS constipation -- continue  Linzess 290 g daily her this medication has been very helpful for him.  25 minutes spent with the patient today. Greater than 50% was spent in counseling and coordination of care with the patient Six-month follow-up, sooner if needed

## 2017-07-20 NOTE — Patient Instructions (Signed)
Continue taking your Dexilant 60mg , Zantac 300mg  (twice a day) and your Linzess 290mg .  We are referring you to Dr. Zella Richer at [facility]. They will call you to schedule an appointment. If you haven't heard from them within the next week please call our office.  If you are age 80 or older, your body mass index should be between 23-30. Your Body mass index is 30.17 kg/m. If this is out of the aforementioned range listed, please consider follow up with your Primary Care Provider.  If you are age 22 or younger, your body mass index should be between 19-25. Your Body mass index is 30.17 kg/m. If this is out of the aformentioned range listed, please consider follow up with your Primary Care Provider.

## 2017-07-22 ENCOUNTER — Telehealth: Payer: Self-pay | Admitting: *Deleted

## 2017-07-22 DIAGNOSIS — H905 Unspecified sensorineural hearing loss: Secondary | ICD-10-CM | POA: Diagnosis not present

## 2017-07-22 NOTE — Telephone Encounter (Signed)
Patient is scheduled for an appointment with Dr Zella Richer on 08/17/17 @ 1:30 pm with 1:00 pm arrival. Patient verbalizes understanding.

## 2017-08-04 DIAGNOSIS — H52203 Unspecified astigmatism, bilateral: Secondary | ICD-10-CM | POA: Diagnosis not present

## 2017-08-04 DIAGNOSIS — Z83511 Family history of glaucoma: Secondary | ICD-10-CM | POA: Diagnosis not present

## 2017-08-04 DIAGNOSIS — H2513 Age-related nuclear cataract, bilateral: Secondary | ICD-10-CM | POA: Diagnosis not present

## 2017-08-04 DIAGNOSIS — H524 Presbyopia: Secondary | ICD-10-CM | POA: Diagnosis not present

## 2017-08-04 DIAGNOSIS — H5203 Hypermetropia, bilateral: Secondary | ICD-10-CM | POA: Diagnosis not present

## 2017-08-09 ENCOUNTER — Other Ambulatory Visit: Payer: Self-pay | Admitting: Internal Medicine

## 2017-08-10 DIAGNOSIS — M5440 Lumbago with sciatica, unspecified side: Secondary | ICD-10-CM | POA: Diagnosis not present

## 2017-08-10 DIAGNOSIS — I10 Essential (primary) hypertension: Secondary | ICD-10-CM | POA: Diagnosis not present

## 2017-08-10 DIAGNOSIS — M4316 Spondylolisthesis, lumbar region: Secondary | ICD-10-CM | POA: Diagnosis not present

## 2017-08-13 ENCOUNTER — Other Ambulatory Visit: Payer: Self-pay | Admitting: Neurosurgery

## 2017-08-13 DIAGNOSIS — M4316 Spondylolisthesis, lumbar region: Secondary | ICD-10-CM

## 2017-08-17 ENCOUNTER — Encounter: Payer: Self-pay | Admitting: Physician Assistant

## 2017-08-17 ENCOUNTER — Encounter: Payer: Self-pay | Admitting: General Surgery

## 2017-08-17 DIAGNOSIS — R079 Chest pain, unspecified: Secondary | ICD-10-CM | POA: Diagnosis not present

## 2017-08-17 DIAGNOSIS — K449 Diaphragmatic hernia without obstruction or gangrene: Secondary | ICD-10-CM | POA: Diagnosis not present

## 2017-08-17 DIAGNOSIS — K219 Gastro-esophageal reflux disease without esophagitis: Secondary | ICD-10-CM | POA: Diagnosis not present
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
## 2017-08-17 NOTE — Progress Notes (Signed)
Charles Coffey 08/17/2017 1:34 PM Location: [facility] Patient #: [id number] DOB: 1935/07/01 Married / Language: Cleophus Molt / Race: White Male   History of Present Illness Odis Hollingshead MD; 08/17/2017 4:54 PM) The patient is a 82 year old male.  Note:He is referred by Dr. Hilarie Fredrickson for consultation regarding a small hiatal hernia and gastroesophageal reflux disease. He's had this for many years and while living in [place] treated it with when necessary Nexium. He started having more persistent episodes of heartburn and discomfort. He is now on dual therapy with excellent and Zantac and this seems to control her symptoms. Recently, he reports some substernal exertional chest discomfort. He thinks it is due to his reflux disease. When he stops exerting himself, the pain goes away. He states he feels is different than his heart attack of pain he had many years ago. He underwent coronary artery bypass surgery in [place]. His wife is here with him. He is due to see his cardiologist, Dr. Saunders Revel, next month.  His workup for the hiatal hernia and gastroesophageal reflux included an upper GI which demonstrated a small hiatal hernia as well as mild esophageal dysmotility. Upper endoscopy demonstrated the hiatal hernia with some Cameron erosions.  Past Surgical History (Tanisha A. Owens Shark, [place]; 08/17/2017 1:34 PM) Tonsillectomy  Vasectomy   Diagnostic Studies History (Tanisha A. Owens Shark, [place]; 08/17/2017 1:34 PM) Colonoscopy  1-5 years ago  Allergies (Tanisha A. Owens Shark, [place]; 08/17/2017 1:36 PM) No Known Drug Allergies 08/17/2017 Allergies Reconciled   Medication History (Tanisha A. Owens Shark, [place]; 08/17/2017 1:38 PM) Carvedilol (6.25MG  Tablet, Oral) Active. Aspirin (81MG  Tablet, Oral) Active. Pramipexole Dihydrochloride (0.125MG  Tablet, Oral) Active. RaNITidine HCl (300MG  Tablet, Oral) Active. TraMADol HCl (50MG  Tablet, Oral) Active. Dexlansoprazole (30MG  Capsule DR, Oral)  Active. Lisinopril (10MG  Tablet, Oral) Active. Medications Reconciled  Social History (Tanisha A. Owens Shark, [place]; 08/17/2017 1:34 PM) Alcohol use  Moderate alcohol use. Caffeine use  Carbonated beverages, Coffee, Tea. No drug use  Tobacco use  Former smoker.  Family History (Tanisha A. Owens Shark, [place]; 08/17/2017 1:34 PM) Alcohol Abuse  Daughter, Family Members In General, Father, Son. Heart Disease  Brother, Mother. Hypertension  Father. Ischemic Bowel Disease  Father, Son. Migraine Headache  Daughter. Prostate Cancer  Brother. Respiratory Condition  Son.  Other Problems (Tanisha A. Owens Shark, [place]; 08/17/2017 1:34 PM) Arthritis  Back Pain  Chest pain  Diverticulosis  Gastroesophageal Reflux Disease  Hemorrhoids  High blood pressure  Hypercholesterolemia  Myocardial infarction  Other disease, cancer, significant illness     Review of Systems (Tanisha A. Brown RMA; 08/17/2017 1:35 PM) General Present- Appetite Loss and Weight Loss. Not Present- Chills, Fatigue, Fever, Night Sweats and Weight Gain. Skin Not Present- Change in Wart/Mole, Dryness, Hives, Jaundice, New Lesions, Non-Healing Wounds, Rash and Ulcer. HEENT Present- Hearing Loss, Hoarseness, Ringing in the Ears and Wears glasses/contact lenses. Not Present- Earache, Nose Bleed, Oral Ulcers, Seasonal Allergies, Sinus Pain, Sore Throat, Visual Disturbances and Yellow Eyes. Respiratory Present- Chronic Cough. Not Present- Bloody sputum, Difficulty Breathing, Snoring and Wheezing. Cardiovascular Present- Chest Pain and Leg Cramps. Not Present- Difficulty Breathing Lying Down, Palpitations, Rapid Heart Rate, Shortness of Breath and Swelling of Extremities. Gastrointestinal Present- Abdominal Pain, Bloating, Constipation, Difficulty Swallowing and Indigestion. Not Present- Bloody Stool, Change in Bowel Habits, Chronic diarrhea, Excessive gas, Gets full quickly at meals, Hemorrhoids, Nausea, Rectal Pain and  Vomiting. Male Genitourinary Not Present- Blood in Urine, Change in Urinary Stream, Frequency, Impotence, Nocturia, Painful Urination, Urgency and Urine Leakage. Musculoskeletal Present- Back  Pain and Joint Stiffness. Not Present- Joint Pain, Muscle Pain, Muscle Weakness and Swelling of Extremities. Neurological Not Present- Decreased Memory, Fainting, Headaches, Numbness, Seizures, Tingling, Tremor, Trouble walking and Weakness. Hematology Present- Easy Bruising. Not Present- Blood Thinners, Excessive bleeding, Gland problems, HIV and Persistent Infections.  Vitals (Tanisha A. Brown RMA; 08/17/2017 1:36 PM) 08/17/2017 1:35 PM Weight: 192.2 lb Height: 68in Body Surface Area: 2.01 m Body Mass Index: 29.22 kg/m  Temp.: 97.28F  Pulse: 64 (Regular)  BP: 122/78 (Sitting, Left Arm, Standard)       Physical Exam Odis Hollingshead MD; 08/17/2017 4:52 PM) The physical exam findings are as follows: Note:GENERAL APPEARANCE: Overweight elderly male in NAD. Pleasant and cooperative.  EARS, NOSE, MOUTH THROAT: [place]/AT external ears: no lesions or deformities external nose: no lesions or deformities hearing: grossly normal lips: moist, no deformities EYES external: conjunctiva, lids, sclerae normal pupils: equal, round  NECK: Supple, no obvious mass or thyroid mass/enlargement, no trachea deviation  CV ascultation: RRR, no murmur extremity edema: no  RESP/CHEST auscultation: breath sounds equal and clear respiratory effort: normal midsternal scar  GASTROINTESTINAL abdomen: Soft, non-tender, non-distended, no masses liver and spleen: not enlarged. hernia: none present  MUSCULOSKELETAL station and gait: normal deformities: none instability: none  LYMPHATIC: No palpable cervical, supraclavicular adenopathy.  SKIN jaundice: none  NEUROLOGIC speech: normal  PSYCHIATRIC alertness and orientation: normal mood/affect/behavior: normal judgement and insight:  normal    Assessment & Plan Odis Hollingshead MD; 08/17/2017 4:53 PM) HIATAL HERNIA WITH GERD (K21.9) Impression: Symptoms are currently controlled but he is on maximal medical therapy. Has some evidence of mild esophageal dysmotility on his upper GI.  Plan: After his cardiac workup, if he is felt to be a safe operative candidate, we'll obtain esophageal manometry. If this is not significantly abnormal, we could proceed with laparoscopic hiatal hernia repair and Nissen fundoplication. I have explained the procedure, risks, and aftercare. Risks include but are not limited to bleeding, infection, wound problems, anesthesia, dysphagia, injury to esophagus/stomach/liver/spleen/intestine,gas bloat, diarrhea, failure to resolve symptoms.  Strict dietary restriction were explained as well as changes in lifestyle.  He seems to understand and agrees with the plan. Current Plans Follow Up - Call [facility] office after tests / studies doneto discuss further plans Pt Education - [facility] Free Text Education/Instructions: discussed with patient and provided information. EXERTIONAL CHEST PAIN (R07.9) Impression: This is recent. This is relieved with rest.  Plan: He needs a cardiology evaluation. I sent a note to Dr. Saunders Revel and Mr. Tupy is going to be seen by one of the PAs 08/19/17.  Wyvonne Lenz.D.

## 2017-08-18 ENCOUNTER — Encounter: Payer: Self-pay | Admitting: Physician Assistant

## 2017-08-19 ENCOUNTER — Encounter: Payer: Self-pay | Admitting: Physician Assistant

## 2017-08-19 ENCOUNTER — Ambulatory Visit (INDEPENDENT_AMBULATORY_CARE_PROVIDER_SITE_OTHER): Payer: Medicare HMO | Admitting: Physician Assistant

## 2017-08-19 VITALS — BP 116/60 | HR 59 | Ht 67.5 in | Wt 195.1 lb

## 2017-08-19 DIAGNOSIS — R0789 Other chest pain: Secondary | ICD-10-CM | POA: Diagnosis not present

## 2017-08-19 DIAGNOSIS — I10 Essential (primary) hypertension: Secondary | ICD-10-CM

## 2017-08-19 DIAGNOSIS — I2510 Atherosclerotic heart disease of native coronary artery without angina pectoris: Secondary | ICD-10-CM

## 2017-08-19 DIAGNOSIS — R072 Precordial pain: Secondary | ICD-10-CM

## 2017-08-19 DIAGNOSIS — Z01810 Encounter for preprocedural cardiovascular examination: Secondary | ICD-10-CM | POA: Diagnosis not present

## 2017-08-19 DIAGNOSIS — E785 Hyperlipidemia, unspecified: Secondary | ICD-10-CM

## 2017-08-19 MED ORDER — NITROGLYCERIN 0.4 MG SL SUBL
0.4000 mg | SUBLINGUAL_TABLET | SUBLINGUAL | 3 refills | Status: DC | PRN
Start: 2017-08-19 — End: 2018-08-24

## 2017-08-19 MED ORDER — ISOSORBIDE MONONITRATE ER 30 MG PO TB24: 30.0000 mg | ORAL_TABLET | Freq: Every day | ORAL | 3 refills | Status: DC

## 2017-08-19 NOTE — Patient Instructions (Addendum)
Medication Instructions:  Your physician has recommended you make the following change in your medication:  1.  START Imdur 30 mg daily 2.  START Nitroglycerin 0.4 s/l tablet USE AS DIRECTED ON THE BOTTLE  Labwork: TODAY:  BMET, CBC, & TSH  Testing/Procedures: Your physician has requested that you have en exercise stress myoview. For further information please visit [email or web address]. Please follow instruction sheet, as given.    Follow-Up: Your physician recommends that you schedule a follow-up appointment in: 1 WEEK AFTER STRESS TEST IS COMPLETE   Any Other Special Instructions Will Be Listed Below (If Applicable).     If you need a refill on your cardiac medications before your next appointment, please call your pharmacy.

## 2017-08-20 ENCOUNTER — Telehealth: Payer: Self-pay | Admitting: *Deleted

## 2017-08-20 DIAGNOSIS — R0989 Other specified symptoms and signs involving the circulatory and respiratory systems: Secondary | ICD-10-CM

## 2017-08-20 DIAGNOSIS — Z79899 Other long term (current) drug therapy: Secondary | ICD-10-CM

## 2017-08-20 LAB — CBC
HEMATOCRIT: 28.2 % — AB (ref 37.5–51.0)
HEMOGLOBIN: 9.8 g/dL — AB (ref 13.0–17.7)
MCH: 30.8 pg (ref 26.6–33.0)
MCHC: 34.8 g/dL (ref 31.5–35.7)
MCV: 89 fL (ref 79–97)
Platelets: 104 10*3/uL — ABNORMAL LOW (ref 150–379)
RBC: 3.18 x10E6/uL — AB (ref 4.14–5.80)
RDW: 17.1 % — ABNORMAL HIGH (ref 12.3–15.4)
WBC: 5.9 10*3/uL (ref 3.4–10.8)

## 2017-08-20 LAB — BASIC METABOLIC PANEL
BUN / CREAT RATIO: 11 (ref 10–24)
BUN: 17 mg/dL (ref 8–27)
CO2: 17 mmol/L — ABNORMAL LOW (ref 20–29)
CREATININE: 1.54 mg/dL — AB (ref 0.76–1.27)
Calcium: 9 mg/dL (ref 8.6–10.2)
Chloride: 106 mmol/L (ref 96–106)
GFR calc Af Amer: 49 mL/min/{1.73_m2} — ABNORMAL LOW (ref >59)
GFR, EST NON AFRICAN AMERICAN: 42 mL/min/{1.73_m2} — AB (ref >59)
GLUCOSE: 93 mg/dL (ref 65–99)
Potassium: 4.6 mmol/L (ref 3.5–5.2)
Sodium: 137 mmol/L (ref 134–144)

## 2017-08-20 LAB — TSH: TSH: 1.09 u[IU]/mL (ref 0.450–4.500)

## 2017-08-20 NOTE — Telephone Encounter (Signed)
-----   Message from Charlie Pitter, [place] sent at 08/20/2017  6:36 AM EDT ----- Anderson Malta - see other result note, but also have one more thing to add - I noticed he had a right carotid bruit on exam yesterday. I looked through his charts this AM and do not see this previously documented. Duplex in 2015 didn't show any significant plaque but since the bruit appears new compared to before and it's been 3 years, lets get a repeat carotid duplex study bilaterally for carotid bruit. Thanks. Dayna Dunn PA-C

## 2017-08-23 ENCOUNTER — Ambulatory Visit (HOSPITAL_COMMUNITY): Payer: Medicare HMO | Attending: Cardiology

## 2017-08-23 ENCOUNTER — Encounter: Payer: Self-pay | Admitting: Physician Assistant

## 2017-08-23 ENCOUNTER — Other Ambulatory Visit: Payer: Medicare HMO

## 2017-08-23 ENCOUNTER — Telehealth: Payer: Self-pay | Admitting: *Deleted

## 2017-08-23 ENCOUNTER — Other Ambulatory Visit: Payer: Medicare HMO | Admitting: *Deleted

## 2017-08-23 DIAGNOSIS — I2510 Atherosclerotic heart disease of native coronary artery without angina pectoris: Secondary | ICD-10-CM | POA: Insufficient documentation

## 2017-08-23 DIAGNOSIS — Z01810 Encounter for preprocedural cardiovascular examination: Secondary | ICD-10-CM | POA: Insufficient documentation

## 2017-08-23 DIAGNOSIS — R079 Chest pain, unspecified: Secondary | ICD-10-CM | POA: Diagnosis not present

## 2017-08-23 DIAGNOSIS — Z79899 Other long term (current) drug therapy: Secondary | ICD-10-CM | POA: Diagnosis not present

## 2017-08-23 DIAGNOSIS — I4510 Unspecified right bundle-branch block: Secondary | ICD-10-CM | POA: Diagnosis not present

## 2017-08-23 DIAGNOSIS — R9439 Abnormal result of other cardiovascular function study: Secondary | ICD-10-CM | POA: Insufficient documentation

## 2017-08-23 DIAGNOSIS — R0609 Other forms of dyspnea: Secondary | ICD-10-CM | POA: Insufficient documentation

## 2017-08-23 DIAGNOSIS — R0789 Other chest pain: Secondary | ICD-10-CM | POA: Diagnosis not present

## 2017-08-23 DIAGNOSIS — I119 Hypertensive heart disease without heart failure: Secondary | ICD-10-CM | POA: Insufficient documentation

## 2017-08-23 DIAGNOSIS — I779 Disorder of arteries and arterioles, unspecified: Secondary | ICD-10-CM | POA: Diagnosis not present

## 2017-08-23 LAB — BASIC METABOLIC PANEL
BUN/Creatinine Ratio: 10 (ref 10–24)
BUN: 17 mg/dL (ref 8–27)
CALCIUM: 9.4 mg/dL (ref 8.6–10.2)
CHLORIDE: 105 mmol/L (ref 96–106)
CO2: 17 mmol/L — AB (ref 20–29)
Creatinine, Ser: 1.68 mg/dL — ABNORMAL HIGH (ref 0.76–1.27)
GFR calc Af Amer: 44 mL/min/{1.73_m2} — ABNORMAL LOW (ref >59)
GFR calc non Af Amer: 38 mL/min/{1.73_m2} — ABNORMAL LOW (ref >59)
Glucose: 90 mg/dL (ref 65–99)
POTASSIUM: 4.4 mmol/L (ref 3.5–5.2)
SODIUM: 137 mmol/L (ref 134–144)

## 2017-08-23 LAB — MYOCARDIAL PERFUSION IMAGING
CHL CUP RESTING HR STRESS: 59 {beats}/min
CSEPHR: 86 %
CSEPPHR: 121 {beats}/min
Estimated workload: 7.5 METS
Exercise duration (min): 6 min
Exercise duration (sec): 23 s
MPHR: 140 {beats}/min

## 2017-08-23 MED ORDER — TECHNETIUM TC 99M TETROFOSMIN IV KIT
32.7000 | PACK | Freq: Once | INTRAVENOUS | Status: AC | PRN
  Administered 2017-08-23: 32.7 via INTRAVENOUS
  Filled 2017-08-23: qty 33

## 2017-08-23 MED ORDER — TECHNETIUM TC 99M TETROFOSMIN IV KIT
10.2000 | PACK | Freq: Once | INTRAVENOUS | Status: AC | PRN
Start: 2017-08-23 — End: 2017-08-23
  Administered 2017-08-23: 10.2 via INTRAVENOUS
  Filled 2017-08-23: qty 11

## 2017-08-23 NOTE — Telephone Encounter (Signed)
Pt calling to let Dayna know that he is scheduled to get 2 injections in his back Tuesday morning at 10:30 AM. Pt would like a call tomorrow in the morning to let him know if this is okay.  I will forward to [facility] for review.

## 2017-08-23 NOTE — Telephone Encounter (Signed)
Called pt, per [name], PA-C, to let him know that we have set up his Left Heart Cath for him. Left a message for pt to call back.    [email or web address] @  [facility] OFFICE [address], [place] [address] [place] [address] Dept: [phone] Loc: [phone]  Charles Coffey  08/23/2017  You are scheduled for a Cardiac Catheterization on Wednesday, August 29 with Dr. Peter Martinique.  1. Please arrive at the [facility] (Main Entrance A) at [facility]: [address] [place], [place] [address] at 12:00 PM (two hours before your procedure to ensure your preparation). Free valet parking service is available.   Special note: Every effort is made to have your procedure done on time. Please understand that emergencies sometimes delay scheduled procedures.  2. Diet: Do not eat or drink anything after midnight prior to your procedure except sips of water to take medications.  3. Labs: You will need to have blood drawn on Tuesday, August 28 at [facility] at [facility]. [address]  Open: 7:30am - 5pm    Phone: [phone]. You do not need to be fasting.  4. Medication instructions in preparation for your procedure:  On the morning of your procedure, take your Aspirin and any morning medicines NOT listed above.  You may use sips of water.  5. Plan for one night stay--bring personal belongings. 6. Bring a current list of your medications and current insurance cards. 7. You MUST have a responsible person to drive you home. 8. Someone MUST be with you the first 24 hours after you arrive home or your discharge will be delayed. 9. Please wear clothes that are easy to get on and off and wear slip-on shoes.  Thank you for allowing [place] to care for you!   -- [facility]

## 2017-08-23 NOTE — Telephone Encounter (Signed)
F/u Message ° °Pt returning RN call .please call back to discuss  °

## 2017-08-23 NOTE — Telephone Encounter (Signed)
Charles Coffey is calling back because he has a question iun which he forgot to ask . Please call

## 2017-08-23 NOTE — Telephone Encounter (Signed)
-----   Message from Charlie Pitter, [place] sent at 08/23/2017  4:42 PM EDT ----- Discussed nuc result in depth with patient. Also discussed with Dr. Aundra Dubin who agrees he needs cardiac catheterization - Dr. Aundra Dubin recommends he be brought in a few hours early for his cath for pre-cath hydration and to f/u GI/PCP for his anemia/renal dysfunction as patient adamantly denies any knowledge of bleeding. Pt has appt coming up this week with them. Risks and benefits of cardiac catheterization have been discussed with the patient.  These include bleeding, infection, kidney damage, stroke, heart attack, death. The patient understands these risks and is willing to proceed. He would like to proceed on Wednesday if opening available. I will write orders once this is scheduled.  Will need repeat CBC and PT/INR tomorrow in prep for case to ensure stability of Hgb. Will also plan to recheck Cr day of cath.  Dayna Dunn PA-C

## 2017-08-23 NOTE — Telephone Encounter (Signed)
No, I would hold off injections at this time - Anderson Malta will be getting him for stat labwork tomorrow (see nuclear stress test result note). Thank you. I also sent him a mychart message - please make sure he knows to remain OFF lisinopril for now.

## 2017-08-24 ENCOUNTER — Inpatient Hospital Stay
Admission: RE | Admit: 2017-08-24 | Discharge: 2017-08-24 | Disposition: A | Discharge status: Home / Self Care | Payer: Medicare HMO | Source: Ambulatory Visit | Attending: Neurosurgery | Admitting: Neurosurgery

## 2017-08-24 ENCOUNTER — Telehealth: Payer: Self-pay | Admitting: Physician Assistant

## 2017-08-24 ENCOUNTER — Other Ambulatory Visit: Payer: Self-pay | Admitting: Physician Assistant

## 2017-08-24 ENCOUNTER — Other Ambulatory Visit: Payer: Medicare HMO

## 2017-08-24 DIAGNOSIS — R079 Chest pain, unspecified: Secondary | ICD-10-CM

## 2017-08-24 DIAGNOSIS — R9439 Abnormal result of other cardiovascular function study: Secondary | ICD-10-CM | POA: Diagnosis not present

## 2017-08-24 LAB — CBC
HEMOGLOBIN: 8.8 g/dL — AB (ref 13.0–17.7)
Hematocrit: 25.6 % — ABNORMAL LOW (ref 37.5–51.0)
MCH: 30.4 pg (ref 26.6–33.0)
MCHC: 34.4 g/dL (ref 31.5–35.7)
MCV: 89 fL (ref 79–97)
PLATELETS: 84 10*3/uL — AB (ref 150–379)
RBC: 2.89 x10E6/uL — AB (ref 4.14–5.80)
RDW: 17.8 % — ABNORMAL HIGH (ref 12.3–15.4)
WBC: 6 10*3/uL (ref 3.4–10.8)

## 2017-08-24 LAB — PROTIME-INR
INR: 1 (ref 0.8–1.2)
PROTHROMBIN TIME: 10.4 s (ref 9.1–12.0)

## 2017-08-24 NOTE — Telephone Encounter (Signed)
I spoke with patient, he is aware Dayna recommended holding off on back injections at this time, he is not taking lisinopril at this time.

## 2017-08-24 NOTE — Telephone Encounter (Signed)
See result note on recent nuclear stress test - had discussed cath in depth with patient yesterday on the phone. Cath is scheduled for tomorrow and orders written. EF 30-44% by nuc but he was euvolemic during recent exam so given his recent renal insufficiency, will plan on bringing in early for hydration (4 hours prior to cath) as discussed with Dr. Aundra Dubin. If he remains inpatient would also consider obtaining 2D echocardiogram while in the hospital to clarify LV function.Otherwise this can be considered upon follow-up.  Tripp Goins PA-C

## 2017-08-24 NOTE — Telephone Encounter (Signed)
CBC reviewed - further downtrend in Hgb to 8.8 with platelet count of 84. Patient had adamantly denied any recent bleeding recently. Given gradual anemia over the last 8 months and renal insufficiency, there is a concern for a more systemic process. I discussed with Dr. Aundra Dubin in depth. At this time we feel that we should probably proceed with diagnostic cath as planned to evaluate for any new obstructive coronary lesions. However, if it is felt that he should require PCI, this would need to be considered at a later time due to recent findings of progressive anemia and thrombocytopenia - may require admission to [facility] after cath for heme/onc +/- GI consult to further evaluate this. I reviewed with Dr. Fletcher Anon who will be doing cath tomorrow to make him aware of anemia and renal insufficiency. I tried to call patient to keep him informed of findings, but got voicemail. If he should call back after hours, please inform patient the plan for cath is still on - but that if a blockage is found, he may need to be admitted to the hospital for hematology input first before we commit him to a stent. I had spoken with Dr. Burt Knack about this patient earlier today and he was sitting next to pre-cath nurse to make her aware as well. Caniya Tagle PA-C

## 2017-08-24 NOTE — Telephone Encounter (Signed)
Pt returned my call.  He has been made aware of his Heart Cath that has been arranged and that we needed STAT Lab work this morning. Pt agrees and will come to the lab this morning. I advised the pt to ask for me when he arrives so we can discuss his instructions for his Cath. Pt agreed and verbalized understanding.

## 2017-08-25 ENCOUNTER — Ambulatory Visit: Payer: Medicare HMO | Admitting: Family Medicine

## 2017-08-25 ENCOUNTER — Encounter (HOSPITAL_COMMUNITY)
Admission: RE | Disposition: A | Discharge status: Home / Self Care | Payer: Self-pay | Source: Ambulatory Visit | Attending: Cardiovascular Disease

## 2017-08-25 ENCOUNTER — Encounter (HOSPITAL_COMMUNITY): Payer: Self-pay

## 2017-08-25 ENCOUNTER — Ambulatory Visit (HOSPITAL_COMMUNITY)
Admission: RE | Admit: 2017-08-25 | Discharge: 2017-08-26 | Disposition: A | Discharge status: Home / Self Care | Payer: Medicare HMO | Source: Ambulatory Visit | Attending: Cardiovascular Disease | Admitting: Cardiovascular Disease

## 2017-08-25 DIAGNOSIS — E785 Hyperlipidemia, unspecified: Secondary | ICD-10-CM | POA: Insufficient documentation

## 2017-08-25 DIAGNOSIS — G8929 Other chronic pain: Secondary | ICD-10-CM | POA: Diagnosis not present

## 2017-08-25 DIAGNOSIS — K589 Irritable bowel syndrome without diarrhea: Secondary | ICD-10-CM | POA: Insufficient documentation

## 2017-08-25 DIAGNOSIS — N401 Enlarged prostate with lower urinary tract symptoms: Secondary | ICD-10-CM | POA: Insufficient documentation

## 2017-08-25 DIAGNOSIS — N189 Chronic kidney disease, unspecified: Secondary | ICD-10-CM | POA: Diagnosis not present

## 2017-08-25 DIAGNOSIS — Z7982 Long term (current) use of aspirin: Secondary | ICD-10-CM | POA: Insufficient documentation

## 2017-08-25 DIAGNOSIS — I255 Ischemic cardiomyopathy: Secondary | ICD-10-CM | POA: Diagnosis not present

## 2017-08-25 DIAGNOSIS — Z981 Arthrodesis status: Secondary | ICD-10-CM | POA: Diagnosis not present

## 2017-08-25 DIAGNOSIS — I2582 Chronic total occlusion of coronary artery: Secondary | ICD-10-CM | POA: Diagnosis not present

## 2017-08-25 DIAGNOSIS — Z87891 Personal history of nicotine dependence: Secondary | ICD-10-CM | POA: Diagnosis not present

## 2017-08-25 DIAGNOSIS — I129 Hypertensive chronic kidney disease with stage 1 through stage 4 chronic kidney disease, or unspecified chronic kidney disease: Secondary | ICD-10-CM | POA: Diagnosis not present

## 2017-08-25 DIAGNOSIS — I209 Angina pectoris, unspecified: Secondary | ICD-10-CM | POA: Diagnosis present

## 2017-08-25 DIAGNOSIS — M545 Low back pain: Secondary | ICD-10-CM | POA: Insufficient documentation

## 2017-08-25 DIAGNOSIS — K219 Gastro-esophageal reflux disease without esophagitis: Secondary | ICD-10-CM | POA: Diagnosis not present

## 2017-08-25 DIAGNOSIS — R9439 Abnormal result of other cardiovascular function study: Secondary | ICD-10-CM

## 2017-08-25 DIAGNOSIS — Z951 Presence of aortocoronary bypass graft: Secondary | ICD-10-CM | POA: Insufficient documentation

## 2017-08-25 DIAGNOSIS — M47812 Spondylosis without myelopathy or radiculopathy, cervical region: Secondary | ICD-10-CM | POA: Insufficient documentation

## 2017-08-25 DIAGNOSIS — I25119 Atherosclerotic heart disease of native coronary artery with unspecified angina pectoris: Secondary | ICD-10-CM | POA: Diagnosis not present

## 2017-08-25 DIAGNOSIS — G2581 Restless legs syndrome: Secondary | ICD-10-CM | POA: Insufficient documentation

## 2017-08-25 DIAGNOSIS — I4510 Unspecified right bundle-branch block: Secondary | ICD-10-CM | POA: Diagnosis not present

## 2017-08-25 DIAGNOSIS — I252 Old myocardial infarction: Secondary | ICD-10-CM | POA: Diagnosis not present

## 2017-08-25 DIAGNOSIS — N138 Other obstructive and reflux uropathy: Secondary | ICD-10-CM | POA: Insufficient documentation

## 2017-08-25 HISTORY — PX: LEFT HEART CATH AND CORS/GRAFTS ANGIOGRAPHY: CATH118250

## 2017-08-25 LAB — CBC
HCT: 24.9 % — ABNORMAL LOW (ref 39.0–52.0)
Hemoglobin: 8.4 g/dL — ABNORMAL LOW (ref 13.0–17.0)
MCH: 30.8 pg (ref 26.0–34.0)
MCHC: 33.7 g/dL (ref 30.0–36.0)
MCV: 91.2 fL (ref 78.0–100.0)
Platelets: 79 10*3/uL — ABNORMAL LOW (ref 150–400)
RBC: 2.73 MIL/uL — ABNORMAL LOW (ref 4.22–5.81)
RDW: 16.3 % — AB (ref 11.5–15.5)
WBC: 5.1 10*3/uL (ref 4.0–10.5)

## 2017-08-25 LAB — BASIC METABOLIC PANEL
Anion gap: 6 (ref 5–15)
BUN: 17 mg/dL (ref 6–20)
CHLORIDE: 111 mmol/L (ref 101–111)
CO2: 21 mmol/L — AB (ref 22–32)
Calcium: 9.1 mg/dL (ref 8.9–10.3)
Creatinine, Ser: 1.63 mg/dL — ABNORMAL HIGH (ref 0.61–1.24)
GFR calc Af Amer: 44 mL/min — ABNORMAL LOW (ref >60)
GFR calc non Af Amer: 38 mL/min — ABNORMAL LOW (ref >60)
GLUCOSE: 102 mg/dL — AB (ref 65–99)
POTASSIUM: 4.4 mmol/L (ref 3.5–5.1)
Sodium: 138 mmol/L (ref 135–145)

## 2017-08-25 LAB — POCT ACTIVATED CLOTTING TIME: ACTIVATED CLOTTING TIME: 142 s

## 2017-08-25 SURGERY — LEFT HEART CATH AND CORS/GRAFTS ANGIOGRAPHY
Anesthesia: LOCAL

## 2017-08-25 MED ORDER — SODIUM CHLORIDE 0.9% FLUSH
3.0000 mL | Freq: Two times a day (BID) | INTRAVENOUS | Status: DC
  Administered 2017-08-25: 3 mL via INTRAVENOUS

## 2017-08-25 MED ORDER — FAMOTIDINE 20 MG PO TABS
20.0000 mg | ORAL_TABLET | Freq: Two times a day (BID) | ORAL | Status: DC
  Administered 2017-08-26: 20 mg via ORAL
  Filled 2017-08-25 (×2): qty 1

## 2017-08-25 MED ORDER — HEPARIN SODIUM (PORCINE) 1000 UNIT/ML IJ SOLN
INTRAMUSCULAR | Status: AC
  Filled 2017-08-25: qty 1

## 2017-08-25 MED ORDER — LIDOCAINE HCL (PF) 1 % IJ SOLN
INTRAMUSCULAR | Status: DC | PRN
  Administered 2017-08-25: 18 mL via INTRADERMAL

## 2017-08-25 MED ORDER — LIDOCAINE HCL (PF) 1 % IJ SOLN
INTRAMUSCULAR | Status: AC
  Filled 2017-08-25: qty 30

## 2017-08-25 MED ORDER — ZOLPIDEM TARTRATE 5 MG PO TABS
5.0000 mg | ORAL_TABLET | Freq: Every evening | ORAL | Status: DC | PRN
  Administered 2017-08-25: 5 mg via ORAL
  Filled 2017-08-25: qty 1

## 2017-08-25 MED ORDER — HEPARIN (PORCINE) IN NACL 2-0.9 UNIT/ML-% IJ SOLN
INTRAMUSCULAR | Status: AC
Start: 2017-08-25 — End: ?
  Filled 2017-08-25: qty 1000

## 2017-08-25 MED ORDER — ACETAMINOPHEN 325 MG PO TABS: 650.0000 mg | ORAL_TABLET | ORAL | Status: DC | PRN

## 2017-08-25 MED ORDER — ASPIRIN EC 81 MG PO TBEC
81.0000 mg | DELAYED_RELEASE_TABLET | Freq: Every day | ORAL | Status: DC
  Administered 2017-08-26: 81 mg via ORAL
  Filled 2017-08-25: qty 1

## 2017-08-25 MED ORDER — SODIUM CHLORIDE 0.9 % IV SOLN
INTRAVENOUS | Status: AC
  Administered 2017-08-25: 19:00:00 via INTRAVENOUS

## 2017-08-25 MED ORDER — SODIUM CHLORIDE 0.9% FLUSH: 3.0000 mL | INTRAVENOUS | Status: DC | PRN

## 2017-08-25 MED ORDER — MIDAZOLAM HCL 2 MG/2ML IJ SOLN
INTRAMUSCULAR | Status: AC
  Filled 2017-08-25: qty 2

## 2017-08-25 MED ORDER — FENTANYL CITRATE (PF) 100 MCG/2ML IJ SOLN
INTRAMUSCULAR | Status: DC | PRN
  Administered 2017-08-25: 25 ug via INTRAVENOUS

## 2017-08-25 MED ORDER — PRAMIPEXOLE DIHYDROCHLORIDE 0.125 MG PO TABS
0.1250 mg | ORAL_TABLET | Freq: Every day | ORAL | Status: DC
  Administered 2017-08-25: 0.125 mg via ORAL
  Filled 2017-08-25 (×2): qty 1

## 2017-08-25 MED ORDER — SODIUM CHLORIDE 0.9 % IV SOLN: 250.0000 mL | INTRAVENOUS | Status: DC | PRN

## 2017-08-25 MED ORDER — SODIUM CHLORIDE 0.9 % WEIGHT BASED INFUSION: 1.0000 mL/kg/h | INTRAVENOUS | Status: DC

## 2017-08-25 MED ORDER — ISOSORBIDE MONONITRATE ER 30 MG PO TB24
30.0000 mg | ORAL_TABLET | Freq: Every day | ORAL | Status: DC
  Administered 2017-08-26: 30 mg via ORAL
  Filled 2017-08-25 (×2): qty 1

## 2017-08-25 MED ORDER — PANTOPRAZOLE SODIUM 40 MG PO TBEC
40.0000 mg | DELAYED_RELEASE_TABLET | Freq: Every day | ORAL | Status: DC
  Administered 2017-08-26: 40 mg via ORAL
  Filled 2017-08-25: qty 1

## 2017-08-25 MED ORDER — ASPIRIN 81 MG PO CHEW: 81.0000 mg | CHEWABLE_TABLET | ORAL | Status: DC

## 2017-08-25 MED ORDER — SODIUM CHLORIDE 0.9 % WEIGHT BASED INFUSION
3.0000 mL/kg/h | INTRAVENOUS | Status: DC
  Administered 2017-08-25: 3 mL/kg/h via INTRAVENOUS

## 2017-08-25 MED ORDER — MIDAZOLAM HCL 2 MG/2ML IJ SOLN
INTRAMUSCULAR | Status: DC | PRN
  Administered 2017-08-25: 1 mg via INTRAVENOUS

## 2017-08-25 MED ORDER — SODIUM CHLORIDE 0.9% FLUSH: 3.0000 mL | Freq: Two times a day (BID) | INTRAVENOUS | Status: DC

## 2017-08-25 MED ORDER — TRAMADOL HCL 50 MG PO TABS: 50.0000 mg | ORAL_TABLET | Freq: Four times a day (QID) | ORAL | Status: DC | PRN

## 2017-08-25 MED ORDER — IOPAMIDOL (ISOVUE-370) INJECTION 76%
INTRAVENOUS | Status: AC
  Filled 2017-08-25: qty 125

## 2017-08-25 MED ORDER — HEPARIN (PORCINE) IN NACL 2-0.9 UNIT/ML-% IJ SOLN
INTRAMUSCULAR | Status: AC | PRN
  Administered 2017-08-25: 1000 mL via INTRA_ARTERIAL

## 2017-08-25 MED ORDER — PNEUMOCOCCAL VAC POLYVALENT 25 MCG/0.5ML IJ INJ: 0.5000 mL | INJECTION | INTRAMUSCULAR | Status: DC | PRN

## 2017-08-25 MED ORDER — NITROGLYCERIN 0.4 MG SL SUBL: 0.4000 mg | SUBLINGUAL_TABLET | SUBLINGUAL | Status: DC | PRN

## 2017-08-25 MED ORDER — IOPAMIDOL (ISOVUE-370) INJECTION 76%
INTRAVENOUS | Status: DC | PRN
  Administered 2017-08-25: 50 mL via INTRA_ARTERIAL

## 2017-08-25 MED ORDER — HEPARIN SODIUM (PORCINE) 1000 UNIT/ML IJ SOLN
INTRAMUSCULAR | Status: DC | PRN
  Administered 2017-08-25: 2000 [IU] via INTRAVENOUS

## 2017-08-25 MED ORDER — FENTANYL CITRATE (PF) 100 MCG/2ML IJ SOLN
INTRAMUSCULAR | Status: AC
  Filled 2017-08-25: qty 2

## 2017-08-25 MED ORDER — ONDANSETRON HCL 4 MG/2ML IJ SOLN: 4.0000 mg | Freq: Four times a day (QID) | INTRAMUSCULAR | Status: DC | PRN

## 2017-08-25 MED ORDER — CARVEDILOL 3.125 MG PO TABS
6.2500 mg | ORAL_TABLET | Freq: Two times a day (BID) | ORAL | Status: DC
  Administered 2017-08-25 – 2017-08-26 (×2): 6.25 mg via ORAL
  Filled 2017-08-25 (×2): qty 2

## 2017-08-25 SURGICAL SUPPLY — 9 items
CATH INFINITI 5 FR IM (CATHETERS) ×2 IMPLANT
CATH INFINITI 5FR MULTPACK ANG (CATHETERS) ×2 IMPLANT
GLIDESHEATH SLEND SS 6F .021 (SHEATH) ×2 IMPLANT
GUIDEWIRE INQWIRE 1.5J.035X260 (WIRE) ×1 IMPLANT
INQWIRE 1.5J .035X260CM (WIRE) ×2
KIT HEART LEFT (KITS) ×2 IMPLANT
PACK CARDIAC CATHETERIZATION (CUSTOM PROCEDURE TRAY) ×2 IMPLANT
SHEATH PINNACLE 5F 10CM (SHEATH) ×2 IMPLANT
TRANSDUCER W/STOPCOCK (MISCELLANEOUS) ×2 IMPLANT

## 2017-08-25 NOTE — Interval H&P Note (Signed)
History and Physical Interval Note:  08/25/2017 5:11 PM  Charles Coffey  has presented today for surgery, with the diagnosis of abnormal stress - cp  The various methods of treatment have been discussed with the patient and family. After consideration of risks, benefits and other options for treatment, the patient has consented to  Procedure(s): LEFT HEART CATH AND CORS/GRAFTS ANGIOGRAPHY (N/A) as a surgical intervention .  The patient's history has been reviewed, patient examined, no change in status, stable for surgery.  I have reviewed the patient's chart and labs.  Questions were answered to the patient's satisfaction.     [name]
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
## 2017-08-26 ENCOUNTER — Telehealth: Payer: Self-pay | Admitting: Physician Assistant

## 2017-08-26 ENCOUNTER — Encounter (HOSPITAL_COMMUNITY): Payer: Self-pay | Admitting: Cardiovascular Disease

## 2017-08-26 DIAGNOSIS — D696 Thrombocytopenia, unspecified: Secondary | ICD-10-CM | POA: Diagnosis not present

## 2017-08-26 DIAGNOSIS — I252 Old myocardial infarction: Secondary | ICD-10-CM | POA: Diagnosis not present

## 2017-08-26 DIAGNOSIS — I209 Angina pectoris, unspecified: Secondary | ICD-10-CM

## 2017-08-26 DIAGNOSIS — I2582 Chronic total occlusion of coronary artery: Secondary | ICD-10-CM | POA: Diagnosis not present

## 2017-08-26 DIAGNOSIS — I25119 Atherosclerotic heart disease of native coronary artery with unspecified angina pectoris: Secondary | ICD-10-CM | POA: Diagnosis not present

## 2017-08-26 DIAGNOSIS — D649 Anemia, unspecified: Secondary | ICD-10-CM

## 2017-08-26 DIAGNOSIS — Z951 Presence of aortocoronary bypass graft: Secondary | ICD-10-CM | POA: Diagnosis not present

## 2017-08-26 DIAGNOSIS — I4510 Unspecified right bundle-branch block: Secondary | ICD-10-CM | POA: Diagnosis not present

## 2017-08-26 DIAGNOSIS — K589 Irritable bowel syndrome without diarrhea: Secondary | ICD-10-CM | POA: Diagnosis not present

## 2017-08-26 DIAGNOSIS — I255 Ischemic cardiomyopathy: Secondary | ICD-10-CM | POA: Diagnosis not present

## 2017-08-26 DIAGNOSIS — R9439 Abnormal result of other cardiovascular function study: Secondary | ICD-10-CM | POA: Diagnosis not present

## 2017-08-26 DIAGNOSIS — I129 Hypertensive chronic kidney disease with stage 1 through stage 4 chronic kidney disease, or unspecified chronic kidney disease: Secondary | ICD-10-CM | POA: Diagnosis not present

## 2017-08-26 DIAGNOSIS — E785 Hyperlipidemia, unspecified: Secondary | ICD-10-CM | POA: Diagnosis not present

## 2017-08-26 DIAGNOSIS — N189 Chronic kidney disease, unspecified: Secondary | ICD-10-CM | POA: Diagnosis not present

## 2017-08-26 LAB — BASIC METABOLIC PANEL
ANION GAP: 3 — AB (ref 5–15)
BUN: 15 mg/dL (ref 6–20)
CALCIUM: 8.6 mg/dL — AB (ref 8.9–10.3)
CO2: 23 mmol/L (ref 22–32)
CREATININE: 1.47 mg/dL — AB (ref 0.61–1.24)
Chloride: 111 mmol/L (ref 101–111)
GFR, EST AFRICAN AMERICAN: 50 mL/min — AB (ref >60)
GFR, EST NON AFRICAN AMERICAN: 43 mL/min — AB (ref >60)
Glucose, Bld: 109 mg/dL — ABNORMAL HIGH (ref 65–99)
Potassium: 4 mmol/L (ref 3.5–5.1)
SODIUM: 137 mmol/L (ref 135–145)

## 2017-08-26 NOTE — Telephone Encounter (Signed)
New message  Pt call requesting to speak with RN. Pt states he is in the hospital and needs to be discharged, but no one has come to do so. Please call back to discuss

## 2017-08-26 NOTE — Plan of Care (Signed)
Problem: Safety: Goal: Ability to remain free from injury will improve Outcome: Completed/Met Date Met: 08/26/17 Patient uses call light appropriately and is able to call RN/NT via white phone using numbers on the white board, all personal items within reach.
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
## 2017-08-26 NOTE — Telephone Encounter (Signed)
Returned pts call and advised him that we had no control of when he was discharged, but he needed to speak to the nurse at the front desk and someone should be able to get him some answers. He verbalized understanding.

## 2017-08-27 ENCOUNTER — Ambulatory Visit (INDEPENDENT_AMBULATORY_CARE_PROVIDER_SITE_OTHER): Payer: Medicare HMO | Admitting: Family Medicine

## 2017-08-27 ENCOUNTER — Encounter: Payer: Self-pay | Admitting: Family Medicine

## 2017-08-27 VITALS — BP 135/69 | HR 58 | Temp 98.4°F | Ht 68.0 in | Wt 195.0 lb

## 2017-08-27 DIAGNOSIS — D649 Anemia, unspecified: Secondary | ICD-10-CM | POA: Diagnosis not present

## 2017-08-27 DIAGNOSIS — N289 Disorder of kidney and ureter, unspecified: Secondary | ICD-10-CM | POA: Diagnosis not present

## 2017-08-27 DIAGNOSIS — D696 Thrombocytopenia, unspecified: Secondary | ICD-10-CM | POA: Diagnosis not present

## 2017-08-27 NOTE — Progress Notes (Signed)
   Subjective:    Patient ID: Charles Coffey, male    DOB: 1935/12/26, 82 y.o.   MRN: [id number]  HPI Here to follow up a hospital stay from 08-25-17 to 08-26-17 for chest pains. He had a cardiac catheterization which demonstrated stable CAD after bypass surgery in 2002. However he had some lab abnormalities that nee to be investigated. First he shows some renal disease with a DC creatinine of 1.47 and a GFR of 50. This was a significant change from 12-01-16 when his creatinine was 1.09 and GFR was 69. Also his HGB was low at 8.4 and platelets were low at 79. His WBC was low normal at 5.1. He feels well in general except for some chronic back pain.    Review of Systems  Constitutional: Negative.   Respiratory: Negative.   Cardiovascular: Negative.   Gastrointestinal: Negative.   Genitourinary: Negative.   Neurological: Negative.        Objective:   Physical Exam  Constitutional: He is oriented to person, place, and time. He appears well-developed and well-nourished.  Cardiovascular: Normal rate, regular rhythm, normal heart sounds and intact distal pulses.   Pulmonary/Chest: Effort normal and breath sounds normal. No respiratory distress. He has no wheezes. He has no rales.  Musculoskeletal: He exhibits no edema.  Neurological: He is alert and oriented to person, place, and time.          Assessment & Plan:  His heart disease is stable but he has evidence of a myeloproliferative disorder and of renal disease. We wil refer him to Hematology and to Nephrology to work these issues up further.  Alysia Penna, MD

## 2017-08-27 NOTE — Patient Instructions (Signed)
WE NOW OFFER    [facility] FAST TRACK!!!  SAME DAY Appointments for ACUTE CARE  Such as: Sprains, Injuries, cuts, abrasions, rashes, muscle pain, joint pain, back pain Colds, flu, sore throats, headache, allergies, cough, fever  Ear pain, sinus and eye infections Abdominal pain, nausea, vomiting, diarrhea, upset stomach Animal/insect bites  3 Easy Ways to Schedule: Walk-In Scheduling Call in scheduling Mychart Sign-up: [email or web address]         

## 2017-08-29 ENCOUNTER — Encounter: Payer: Self-pay | Admitting: Physician Assistant
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
## 2017-08-31 ENCOUNTER — Encounter: Payer: Self-pay | Admitting: Physician Assistant

## 2017-08-31 ENCOUNTER — Ambulatory Visit (INDEPENDENT_AMBULATORY_CARE_PROVIDER_SITE_OTHER): Payer: Medicare HMO | Admitting: Physician Assistant

## 2017-08-31 VITALS — BP 128/72 | HR 60 | Ht 68.0 in | Wt 196.4 lb

## 2017-08-31 DIAGNOSIS — R0989 Other specified symptoms and signs involving the circulatory and respiratory systems: Secondary | ICD-10-CM | POA: Diagnosis not present

## 2017-08-31 DIAGNOSIS — N289 Disorder of kidney and ureter, unspecified: Secondary | ICD-10-CM | POA: Diagnosis not present

## 2017-08-31 DIAGNOSIS — D649 Anemia, unspecified: Secondary | ICD-10-CM | POA: Diagnosis not present

## 2017-08-31 DIAGNOSIS — I2510 Atherosclerotic heart disease of native coronary artery without angina pectoris: Secondary | ICD-10-CM

## 2017-08-31 DIAGNOSIS — D696 Thrombocytopenia, unspecified: Secondary | ICD-10-CM

## 2017-08-31 DIAGNOSIS — I255 Ischemic cardiomyopathy: Secondary | ICD-10-CM | POA: Diagnosis not present

## 2017-08-31 MED ORDER — ISOSORBIDE MONONITRATE ER 30 MG PO TB24: 30.0000 mg | ORAL_TABLET | Freq: Every day | ORAL | 3 refills | Status: DC

## 2017-08-31 NOTE — Patient Instructions (Addendum)
Medication Instructions:  Your physician has recommended you make the following change in your medication:  1.  RESUME the Imdur 30 mg (try taking it at nightly, if headache persist, cut 1/2) 2.  STOP the Lisinopril  Labwork: TODAY:  BMET & CBC  Testing/Procedures: Your physician has requested that you have an echocardiogram. Echocardiography is a painless test that uses sound waves to create images of your heart. It provides your doctor with information about the size and shape of your heart and how well your heart's chambers and valves are working. This procedure takes approximately one hour. There are no restrictions for this procedure.  Your physician has requested that you have a carotid duplex. This test is an ultrasound of the carotid arteries in your neck. It looks at blood flow through these arteries that supply the brain with blood. Allow one hour for this exam. There are no restrictions or special instructions.  Follow-Up: Your physician recommends that you schedule a follow-up appointment in: AS PLANNED   Any Other Special Instructions Will Be Listed Below (If Applicable). Echocardiogram An echocardiogram, or echocardiography, uses sound waves (ultrasound) to produce an image of your heart. The echocardiogram is simple, painless, obtained within a short period of time, and offers valuable information to your health care provider. The images from an echocardiogram can provide information such as:  Evidence of coronary artery disease (CAD).  Heart size.  Heart muscle function.  Heart valve function.  Aneurysm detection.  Evidence of a past heart attack.  Fluid buildup around the heart.  Heart muscle thickening.  Assess heart valve function.  Tell a health care provider about:  Any allergies you have.  All medicines you are taking, including vitamins, herbs, eye drops, creams, and over-the-counter medicines.  Any problems you or family members have had with  anesthetic medicines.  Any blood disorders you have.  Any surgeries you have had.  Any medical conditions you have.  Whether you are pregnant or may be pregnant. What happens before the procedure? No special preparation is needed. Eat and drink normally. What happens during the procedure?  In order to produce an image of your heart, gel will be applied to your chest and a wand-like tool (transducer) will be moved over your chest. The gel will help transmit the sound waves from the transducer. The sound waves will harmlessly bounce off your heart to allow the heart images to be captured in real-time motion. These images will then be recorded.  You may need an IV to receive a medicine that improves the quality of the pictures. What happens after the procedure? You may return to your normal schedule including diet, activities, and medicines, unless your health care provider tells you otherwise. This information is not intended to replace advice given to you by your health care provider. Make sure you discuss any questions you have with your health care provider. Document Released: 12/11/2000 Document Revised: 08/01/2016 Document Reviewed: 08/21/2013 Elsevier Interactive Patient Education  2017 [place].  Vascular Ultrasound An ultrasound, also called sonography or ultrasonography, uses harmless sound waves to take pictures of the inside of your body. The pictures are taken with a device called a transducer that is held up against your body. The continually changing pictures can be recorded on videotape or film. A vascular ultrasound is a painless test to see if you have blood flow problems or clots in your blood vessels. It may be done to look at blood vessels almost anywhere in the body. There  are several types of ultrasounds that can be done to look at the blood vessels. They include:  Continuous wave Doppler ultrasound. This type of ultrasound uses the change in pitch of sound waves to  provide information about blood flow through a blood vessel. During the test, a health care provider listens to the sounds produced by the transducer.  Duplex ultrasound. This type of ultrasound uses standard ultrasound methods to produce a picture of a blood vessel and surrounding organs. In addition, a computer provides information about the speed and direction of blood flow through the blood vessel. With this type of ultrasound it is possible to see the structures inside the body and to evaluate blood flow within those structures at the same time.  Color Doppler ultrasound. This type of ultrasound uses standard ultrasound methods to produce a picture of a blood vessel. In addition, a computer converts the Doppler sounds into colors that are overlaid on the picture of the blood vessel. These colors represent the speed and direction of blood flow through the vessel.  Power Doppler ultrasound. This type of ultrasound is up to five times more sensitive than color Doppler ultrasound. Power Doppler ultrasound can also get pictures that are difficult or impossible to get using standard color Doppler ultrasound. Power Doppler ultrasound is most commonly used to evaluate blood flow through vessels within organs, such as the liver or kidneys.  Transcranial Doppler ultrasound. This type of ultrasound looks at blood flow in blood vessels throughout the brain. It can reveal the presence of narrow arteries, clots blocking the vessels, or malformed blood vessels.  What are the risks? There are no known risks or complications of having an ultrasound. What happens before the procedure?  If the ultrasound scan involves your upper abdomen, you may be directed not to eat, smoke, or chew gum the morning of your exam. Follow your health care provider's instructions.  During the test, a gel will be applied to your skin. Wear clothing that is easily washable in case the gel gets on your clothes. What happens during the  procedure?  A gel will be applied to your skin. It may feel cool.  The transducer will be placed on the area to be examined.  Pictures will be taken. They will be displayed on one or more monitors that look like small television screens. What happens after the procedure?  You can safely drive home and return to regular activities immediately after your exam.  Keep follow-up visits as directed by your health care provider.  Ask when your test results will be ready. It is your responsibility to get your test results. This information is not intended to replace advice given to you by your health care provider. Make sure you discuss any questions you have with your health care provider. Document Released: 12/25/2004 Document Revised: 05/21/2016 Document Reviewed: 03/08/2014 Elsevier Interactive Patient Education  [employer].   If you need a refill on your cardiac medications before your next appointment, please call your pharmacy.

## 2017-09-01 ENCOUNTER — Other Ambulatory Visit: Payer: Self-pay

## 2017-09-01 ENCOUNTER — Ambulatory Visit (HOSPITAL_COMMUNITY): Payer: Medicare HMO | Attending: Cardiology

## 2017-09-01 DIAGNOSIS — I252 Old myocardial infarction: Secondary | ICD-10-CM | POA: Insufficient documentation

## 2017-09-01 DIAGNOSIS — I2510 Atherosclerotic heart disease of native coronary artery without angina pectoris: Secondary | ICD-10-CM | POA: Insufficient documentation

## 2017-09-01 DIAGNOSIS — I340 Nonrheumatic mitral (valve) insufficiency: Secondary | ICD-10-CM | POA: Insufficient documentation

## 2017-09-01 DIAGNOSIS — I4510 Unspecified right bundle-branch block: Secondary | ICD-10-CM | POA: Insufficient documentation

## 2017-09-01 DIAGNOSIS — E785 Hyperlipidemia, unspecified: Secondary | ICD-10-CM | POA: Insufficient documentation

## 2017-09-01 DIAGNOSIS — I255 Ischemic cardiomyopathy: Secondary | ICD-10-CM | POA: Diagnosis not present

## 2017-09-01 DIAGNOSIS — Z87891 Personal history of nicotine dependence: Secondary | ICD-10-CM | POA: Diagnosis not present

## 2017-09-01 LAB — ECHOCARDIOGRAM COMPLETE
Ao-asc: 34 cm
CHL CUP MV DEC (S): 275
CHL CUP STROKE VOLUME: 70 mL
CHL CUP TV REG PEAK VELOCITY: 229 cm/s
E decel time: 275 msec
EERAT: 11.25
FS: 25 % — AB (ref 28–44)
IVS/LV PW RATIO, ED: 0.93
LA ID, A-P, ES: 45 mm
LA diam index: 2.22 cm/m2
LA vol A4C: 90 ml
LA vol index: 40.9 mL/m2
LAVOL: 83 mL
LEFT ATRIUM END SYS DIAM: 45 mm
LV E/e'average: 11.25
LV PW d: 12 mm — AB (ref 0.6–1.1)
LV TDI E'LATERAL: 8.38
LV dias vol index: 65 mL/m2
LV dias vol: 132 mL (ref 62–150)
LV sys vol index: 31 mL/m2
LV sys vol: 62 mL — AB (ref 21–61)
LVEEMED: 11.25
LVELAT: 8.38 cm/s
LVOT SV: 80 mL
LVOT VTI: 17.8 cm
LVOT area: 4.52 cm2
LVOT diameter: 24 mm
LVOT peak grad rest: 3 mmHg
LVOT peak vel: 82.7 cm/s
MV pk E vel: 94.3 m/s
MVPG: 4 mmHg
MVPKAVEL: 87.9 m/s
S' Lateral: 12 cm/s
Simpson's disk: 53
TDI e' medial: 6.43
TR max vel: 229 cm/s

## 2017-09-01 LAB — BASIC METABOLIC PANEL
BUN/Creatinine Ratio: 14 (ref 10–24)
BUN: 22 mg/dL (ref 8–27)
CALCIUM: 9.6 mg/dL (ref 8.6–10.2)
CO2: 18 mmol/L — AB (ref 20–29)
CREATININE: 1.61 mg/dL — AB (ref 0.76–1.27)
Chloride: 104 mmol/L (ref 96–106)
GFR, EST AFRICAN AMERICAN: 46 mL/min/{1.73_m2} — AB (ref >59)
GFR, EST NON AFRICAN AMERICAN: 40 mL/min/{1.73_m2} — AB (ref >59)
Glucose: 81 mg/dL (ref 65–99)
POTASSIUM: 5.1 mmol/L (ref 3.5–5.2)
Sodium: 139 mmol/L (ref 134–144)

## 2017-09-01 LAB — CBC
HEMATOCRIT: 29.4 % — AB (ref 37.5–51.0)
HEMOGLOBIN: 10.1 g/dL — AB (ref 13.0–17.7)
MCH: 30.4 pg (ref 26.6–33.0)
MCHC: 34.4 g/dL (ref 31.5–35.7)
MCV: 89 fL (ref 79–97)
Platelets: 115 10*3/uL — ABNORMAL LOW (ref 150–379)
RBC: 3.32 x10E6/uL — AB (ref 4.14–5.80)
RDW: 17.2 % — ABNORMAL HIGH (ref 12.3–15.4)
WBC: 5.5 10*3/uL (ref 3.4–10.8)

## 2017-09-02 ENCOUNTER — Telehealth: Payer: Self-pay | Admitting: Physician Assistant

## 2017-09-02 ENCOUNTER — Ambulatory Visit (HOSPITAL_COMMUNITY)
Admission: RE | Admit: 2017-09-02 | Discharge: 2017-09-02 | Disposition: A | Discharge status: Home / Self Care | Payer: Medicare HMO | Source: Ambulatory Visit | Attending: Internal Medicine | Admitting: Internal Medicine

## 2017-09-02 DIAGNOSIS — R0989 Other specified symptoms and signs involving the circulatory and respiratory systems: Secondary | ICD-10-CM | POA: Diagnosis not present

## 2017-09-02 DIAGNOSIS — I6523 Occlusion and stenosis of bilateral carotid arteries: Secondary | ICD-10-CM | POA: Diagnosis not present

## 2017-09-02 NOTE — Telephone Encounter (Signed)
Returned pts call and he has been mae aware of his echo results. See result note.

## 2017-09-02 NOTE — Telephone Encounter (Signed)
-----   Message from Charlie Pitter, [place] sent at 09/01/2017  3:54 PM EDT ----- Please let patient know EF 50%, actually better than recent nuclear stress test. There were wall motion abnormalities which are likely due to his known heart blockage. Stiffness noted. Would advise to generally stick to lower sodium diet (aiming for maximim 2,000mg  per day) and staying hydrated but not to excess >2L per day of total fluid intake. Moderate mitral regurgitation noted (leakiness of valve) which will need to be followed over time. Dayna Dunn PA-C

## 2017-09-02 NOTE — Telephone Encounter (Signed)
Follow Up:      Returning your call from this morning, concerning his lab results. 

## 2017-09-09 ENCOUNTER — Telehealth: Payer: Self-pay | Admitting: Oncology

## 2017-09-09 ENCOUNTER — Encounter: Payer: Self-pay | Admitting: Oncology

## 2017-09-09 ENCOUNTER — Other Ambulatory Visit (HOSPITAL_COMMUNITY): Payer: Self-pay | Admitting: *Deleted

## 2017-09-09 MED ORDER — CARVEDILOL 6.25 MG PO TABS: 6.2500 mg | ORAL_TABLET | Freq: Two times a day (BID) | ORAL | 0 refills | Status: DC

## 2017-09-09 NOTE — Telephone Encounter (Signed)
Appt has been scheduled for the pt to see Dr. [name] on 9/27 at 11am. Pt aware to arrive 30 minutes early. Address and insurance verified. Letter mailed to the pt.

## 2017-09-13 ENCOUNTER — Telehealth: Payer: Self-pay | Admitting: Physician Assistant

## 2017-09-13 NOTE — Telephone Encounter (Signed)
Pt states insurance will not cover [place]. Pt states he was on Repatha in the past and it worked. He was in a study or able to get at no cost at that time.  Pt states Dayna asked him to check on cost of these medications at last office visit with her. Pt advised I will forward to [facility] and Anderson Malta for review.

## 2017-09-13 NOTE — Telephone Encounter (Signed)
New message    Pt is returning call to Charles Coffey about information about his medication and what his insurance will cover.

## 2017-09-13 NOTE — Telephone Encounter (Signed)
Pt calling to let Anderson Malta know Praulent would be Tier 5 -cost to him $ 375/month.

## 2017-09-14 NOTE — Telephone Encounter (Signed)
Would see if pt is willing to be seen in lipid clinic to discuss PCSK9i therapy. He should qualify for either Praluent or Repatha but Medicare copays do usually range $100-400 per month. He may qualify for pt assistance foundation for free medication ,but would need to discuss this with pt in clinic. Thanks!

## 2017-09-14 NOTE — Telephone Encounter (Signed)
This is managed through lipid clinic - we do not prescribe, so check with pharmD. Thx!

## 2017-09-14 NOTE — Telephone Encounter (Signed)
Any coverage for praluent? That would be only other option. Dayna Dunn PA-C

## 2017-09-15 ENCOUNTER — Ambulatory Visit (HOSPITAL_BASED_OUTPATIENT_CLINIC_OR_DEPARTMENT_OTHER): Payer: Medicare HMO

## 2017-09-15 ENCOUNTER — Ambulatory Visit (HOSPITAL_BASED_OUTPATIENT_CLINIC_OR_DEPARTMENT_OTHER): Payer: Medicare HMO | Admitting: Oncology

## 2017-09-15 ENCOUNTER — Telehealth: Payer: Self-pay | Admitting: Oncology

## 2017-09-15 VITALS — BP 120/65 | HR 53 | Temp 97.7°F | Resp 18 | Ht 68.0 in | Wt 193.1 lb

## 2017-09-15 DIAGNOSIS — D696 Thrombocytopenia, unspecified: Secondary | ICD-10-CM

## 2017-09-15 DIAGNOSIS — M545 Low back pain: Secondary | ICD-10-CM | POA: Diagnosis not present

## 2017-09-15 DIAGNOSIS — N289 Disorder of kidney and ureter, unspecified: Secondary | ICD-10-CM

## 2017-09-15 DIAGNOSIS — I10 Essential (primary) hypertension: Secondary | ICD-10-CM

## 2017-09-15 DIAGNOSIS — D649 Anemia, unspecified: Secondary | ICD-10-CM

## 2017-09-15 DIAGNOSIS — Z87891 Personal history of nicotine dependence: Secondary | ICD-10-CM | POA: Diagnosis not present

## 2017-09-15 DIAGNOSIS — G8929 Other chronic pain: Secondary | ICD-10-CM | POA: Diagnosis not present

## 2017-09-15 LAB — CBC WITH DIFFERENTIAL/PLATELET
BASO%: 0.3 % (ref 0.0–2.0)
Basophils Absolute: 0 10*3/uL (ref 0.0–0.1)
EOS%: 2.1 % (ref 0.0–7.0)
Eosinophils Absolute: 0.1 10*3/uL (ref 0.0–0.5)
HCT: 29.5 % — ABNORMAL LOW (ref 38.4–49.9)
HGB: 10 g/dL — ABNORMAL LOW (ref 13.0–17.1)
LYMPH%: 36.2 % (ref 14.0–49.0)
MCH: 31 pg (ref 27.2–33.4)
MCHC: 33.9 g/dL (ref 32.0–36.0)
MCV: 91.3 fL (ref 79.3–98.0)
MONO#: 0.4 10*3/uL (ref 0.1–0.9)
MONO%: 7.2 % (ref 0.0–14.0)
NEUT#: 3.2 10*3/uL (ref 1.5–6.5)
NEUT%: 54.2 % (ref 39.0–75.0)
Platelets: 114 10*3/uL — ABNORMAL LOW (ref 140–400)
RBC: 3.23 10*6/uL — ABNORMAL LOW (ref 4.20–5.82)
RDW: 16 % — ABNORMAL HIGH (ref 11.0–14.6)
WBC: 5.8 10*3/uL (ref 4.0–10.3)
lymph#: 2.1 10*3/uL (ref 0.9–3.3)
nRBC: 0 % (ref 0–0)

## 2017-09-15 LAB — CHCC SMEAR

## 2017-09-15 NOTE — Telephone Encounter (Signed)
Gave avs and calendar for december °

## 2017-09-15 NOTE — Telephone Encounter (Signed)
Please offer appt to patient w/ pharmacist clinic. Dayna Dunn PA-C

## 2017-09-15 NOTE — Progress Notes (Signed)
Reason for Referral: Anemia and thrombocytopenia.   HPI: 82 year old gentleman native of [place] but currently lives in this area have done so since 2011. He is a gentleman with history of coronary artery disease, hypertension and ischemic cardiomyopathy. He was hospitalized in August 2018 for chest pain and underwent cardiac catheterization. During his hospitalization, he was noted to hemoglobin that drifted from 9.8-8.4 with a normal MCV and elevated RDW of 16.3. His white cell count was normal at 5.1. He is a platelet count was also noted to be low at 79. He had a repeat his CBC as an outpatient on 08/31/2017 which showed a white cell count of 5.5, hemoglobin of 10.1 and a platelet count of 115. His MCV was 89 with RDW of 17.2. His CBC was normal in 2015 and started developing mild anemia in May 2017 with a hemoglobin of 10.9 and a platelet count of 122.  Since his discharge, he feels reasonably fair and continues to attend to her activities of daily living. He does have symptoms of mild fatigue and periodic chest pain on exertion. He also reported close to 15 pound weight loss that his appetite feels reasonable at this time. He still able to work in the yard without inability to do so. He denied any bone pain or pathological fractures. He does have chronic back pain which is unchanged. He did travel to [place] and have been in the woods on multiple occasions.  He does not report any headaches, blurry vision, syncope or seizures. He does not report any fevers, chills, sweats. He does not report any palpitation, orthopnea or leg edema. He does not report any cough, wheezing or hemoptysis. He does not report any nausea, vomiting or abdominal pain. He does not report any frequency urgency or hesitancy. He does not report any skeletal complaints rales or myalgias. Remaining review of systems unremarkable.   Past Medical History:  Diagnosis Date  . Aortic atherosclerosis ([place])   . BPH with urinary  obstruction   . CAD (coronary artery disease)    a.  MI 1995, CABG x 3 2002 (patient says that he had LIMA and RIMA grafts). b. ETT-Cardiolite (10/15) with EF 48%, apical scar, no ischemia. c. Nuc 07/2017 abnormal -> cath was performed,  patent LIMA to LAD and SVG to OM 2. RIMA to RCA is atretic. The right coronary artery is occluded distally with extensive collaterals from the LAD, medical therapy.   . Cervical spondylosis without myelopathy 2020-12-415  . Chronic low back pain    sees Dr. Kary Kos   . GERD (gastroesophageal reflux disease)   . Hiatal hernia   . Hyperlipidemia   . Hypertension   . IBS (irritable bowel syndrome)   . Ischemic cardiomyopathy   . Myocardial infarction ([place])   . RBBB   . Restless legs syndrome   . Statin intolerance   . Syncope    a. in 2015 - no apparent cause, was taking sleep medicine at the time. Cardiac workup unremarkable.  . Tubular adenoma of colon   :  Past Surgical History:  Procedure Laterality Date  . COLONOSCOPY  10/17/2014   per Dr. Hilarie Fredrickson, tubular adenomas, repeat in 3 yrs   . HEART BYPASS    . LEFT HEART CATH AND CORS/GRAFTS ANGIOGRAPHY N/A 08/25/2017   Procedure: LEFT HEART CATH AND CORS/GRAFTS ANGIOGRAPHY;  Surgeon: Wellington Hampshire, MD;  Location: [facility];  Service: Cardiovascular;  Laterality: N/A;  . LUMBAR FUSION  2003   L3-L4  .  PROSTATE SURGERY  06-27-12   per Dr. Roni Bread, had CTT  . TONSILLECTOMY    :   Current Outpatient Prescriptions:  .  aspirin 81 MG tablet, Take 81 mg by mouth daily.  , Disp: , Rfl:  .  carvedilol (COREG) 6.25 MG tablet, Take 1 tablet (6.25 mg total) by mouth 2 (two) times daily with a meal., Disp: 60 tablet, Rfl: 0 .  isosorbide mononitrate (IMDUR) 30 MG 24 hr tablet, Take 1 tablet (30 mg total) by mouth daily., Disp: 90 tablet, Rfl: 3 .  linaclotide (LINZESS) 290 MCG CAPS capsule, TAKE 1 CAPSULE (290 MCG TOTAL) BY MOUTH DAILY. 30 MINUTES PRIOR TO A MEAL, Disp: 30 capsule, Rfl: 10 .   nitroGLYCERIN (NITROSTAT) 0.4 MG SL tablet, Place 1 tablet (0.4 mg total) under the tongue every 5 (five) minutes as needed., Disp: 25 tablet, Rfl: 3 .  Plant Sterols and Stanols (CHOLESTOFF PO), Take 2 tablets by mouth 2 (two) times daily., Disp: , Rfl:  .  pramipexole (MIRAPEX) 1 MG tablet, TAKE 1 AND 1/2 TABLETS BY MOUTH AT BEDTIME, Disp: 45 tablet, Rfl: 11 .  ranitidine (ZANTAC) 300 MG tablet, Take 1 tablet (300 mg total) by mouth 2 (two) times daily., Disp: 60 tablet, Rfl: 3 .  Tetrahydroz-Glyc-Hyprom-PEG (VISINE MAXIMUM REDNESS RELIEF OP), Place 2 drops into both eyes 2 (two) times daily., Disp: , Rfl:  .  traMADol (ULTRAM) 50 MG tablet, Take 50 mg by mouth every 6 (six) hours as needed., Disp: , Rfl: :  Allergies  Allergen Reactions  . Antihistamines, Loratadine-Type Other (See Comments)    Unable to urinate  . Statins Other (See Comments)    liver effects  . Pheniramine Rash    Not specific on what type of antihistamines---Causes urinary retention  :  Family History  Problem Relation Age of Onset  . Heart disease Mother   . Heart attack Mother   . Aneurysm Father        femoral artery  . Heart disease Brother   . Heart disease Maternal Uncle        x 2  . Colon cancer Neg Hx   . Esophageal cancer Neg Hx   . Pancreatic cancer Neg Hx   . Kidney disease Neg Hx   . Liver disease Neg Hx   :  Social History   Social History  . Marital status: Married    Spouse name: N/A  . Number of children: 5  . Years of education: Some coll   Occupational History  . retired    Social History Main Topics  . Smoking status: Former Smoker    Types: Cigarettes    Quit date: 12/28/1978  . Smokeless tobacco: Never Used  . Alcohol use 0.0 oz/week     Comment: occ-  . Drug use: No  . Sexual activity: Not on file   Other Topics Concern  . Not on file   Social History Narrative   Lives at home w/ his wife   Right-handed   5 cups of coffee per day  :  Pertinent items are noted  in HPI.  Exam: Blood pressure 120/65, pulse (!) 53, temperature 97.7 F (36.5 C), temperature source Oral, resp. rate 18, height '5\' 8"'$  (1.727 m), weight 193 lb 1.6 oz (87.6 kg), SpO2 100 %.  ECOG 1  General appearance: Well-appearing gentleman without distress. Throat: No oral thrush or ulcers. Neck: no adenopathy or thyroid masses. Back: negative without any rashes or lesions. Resp: clear  to auscultation bilaterally without dullness to percussion. Cardio: regular rate and rhythm, S1, S2 normal, no murmur, click, rub or gallop GI: soft, non-tender; bowel sounds normal; no masses,  no organomegaly Extremities: extremities normal, atraumatic, no cyanosis or edema Pulses: 2+ and symmetric  CBC    Component Value Date/Time   WBC 5.5 08/31/2017 1135   WBC 5.1 08/25/2017 1238   RBC 3.32 (L) 08/31/2017 1135   RBC 2.73 (L) 08/25/2017 1238   HGB 10.1 (L) 08/31/2017 1135   HCT 29.4 (L) 08/31/2017 1135   PLT 115 (L) 08/31/2017 1135   MCV 89 08/31/2017 1135   MCH 30.4 08/31/2017 1135   MCH 30.8 08/25/2017 1238   MCHC 34.4 08/31/2017 1135   MCHC 33.7 08/25/2017 1238   RDW 17.2 (H) 08/31/2017 1135   LYMPHSABS 1.6 12/01/2016 0827   MONOABS 0.5 12/01/2016 0827   EOSABS 0.1 12/01/2016 0827   BASOSABS 0.1 12/01/2016 0827      Assessment and Plan:   82 year old gentleman with the following issues:  1. Normocytic, normochromic anemia diagnosed in August 2018. He started developing mild anemia dating back to 2017 with a hemoglobin of 10.9. His most recent hemoglobin on 08/31/2017 was 10.1 with an MCV that is normal and an RDW of 17.2. His creatinine was noted to be elevated at 1.61 with a creatinine clearance of about 40 mL/m.  The differential diagnosis was discussed today with the patient and his wife. Anemia of renal disease is a strong possibility given his mild renal insufficiency. Recent hospitalization could've exacerbated his renal insufficiency and made his anemia is slightly worse.  Other considerations would include iron deficiency, vitamin B-12 deficiency, plasma cell disorder and early myelodysplastic syndrome.  Anemia of chronic disease could also be a possibility given his other comorbid conditions. Possible exposure to Lyme disease is also a possibility expressed by the patient and his wife.  For a management standpoint, I will repeat his CBC today and obtain iron studies, erythropoietin, serum protein electrophoresis to complete the workup. At this time his hemoglobin does not require any intervention. No need for transfusion or growth factor support. Aranesp or Procrit can be used in the future to boost his red cell production if needed to.  The need for bone marrow biopsy was discussed today. I see no clear indication to proceed with a bone marrow biopsy at this time but would be a consideration of other cytopenias are detected.  2. Thrombocytopenia: Appears to be reactive and fluctuating in nature. His platelet count has recovered reasonably well since his recent discharge. This could be also an early sign of a blood disorder but considered less likely.  The plan is to continue monitoring his counts and consider bone marrow biopsy as mentioned above in the future.  3. Renal insufficiency: Referral to nephrology is in place. His renal insufficiency could be the main cause for his mild anemia.

## 2017-09-15 NOTE — Telephone Encounter (Signed)
Pt is agreeable to speaking with Jinny Blossom and discussing further options for him since his copay is so high for the [facility]. We have scheduled him for 09/21/17 2:30.  Pt verbalized understanding.

## 2017-09-16 ENCOUNTER — Encounter: Payer: Self-pay | Admitting: Family Medicine

## 2017-09-16 LAB — IRON AND TIBC
%SAT: 37 % (ref 20–55)
Iron: 100 ug/dL (ref 42–163)
TIBC: 271 ug/dL (ref 202–409)
UIBC: 172 ug/dL (ref 117–376)

## 2017-09-16 LAB — ERYTHROPOIETIN: ERYTHROPOIETIN: 14.8 m[IU]/mL (ref 2.6–18.5)

## 2017-09-16 LAB — FERRITIN: Ferritin: 102 ng/ml (ref 22–316)

## 2017-09-18 LAB — METHYLMALONIC ACID, SERUM: METHYLMALONIC ACID: 305 nmol/L (ref 0–378)

## 2017-09-20 ENCOUNTER — Ambulatory Visit (INDEPENDENT_AMBULATORY_CARE_PROVIDER_SITE_OTHER): Payer: Medicare HMO | Admitting: Internal Medicine

## 2017-09-20 ENCOUNTER — Encounter: Payer: Self-pay | Admitting: Internal Medicine

## 2017-09-20 VITALS — BP 124/62 | HR 76 | Ht 68.0 in | Wt 196.0 lb

## 2017-09-20 DIAGNOSIS — D649 Anemia, unspecified: Secondary | ICD-10-CM | POA: Insufficient documentation

## 2017-09-20 DIAGNOSIS — I255 Ischemic cardiomyopathy: Secondary | ICD-10-CM | POA: Diagnosis not present

## 2017-09-20 DIAGNOSIS — I25118 Atherosclerotic heart disease of native coronary artery with other forms of angina pectoris: Secondary | ICD-10-CM

## 2017-09-20 DIAGNOSIS — N183 Chronic kidney disease, stage 3 unspecified: Secondary | ICD-10-CM | POA: Insufficient documentation

## 2017-09-20 DIAGNOSIS — E785 Hyperlipidemia, unspecified: Secondary | ICD-10-CM

## 2017-09-20 LAB — MULTIPLE MYELOMA PANEL, SERUM
ALBUMIN/GLOB SERPL: 1.5 (ref 0.7–1.7)
ALPHA2 GLOB SERPL ELPH-MCNC: 0.6 g/dL (ref 0.4–1.0)
Albumin SerPl Elph-Mcnc: 3.7 g/dL (ref 2.9–4.4)
Alpha 1: 0.2 g/dL (ref 0.0–0.4)
B-GLOBULIN SERPL ELPH-MCNC: 0.8 g/dL (ref 0.7–1.3)
GAMMA GLOB SERPL ELPH-MCNC: 0.9 g/dL (ref 0.4–1.8)
GLOBULIN, TOTAL: 2.5 g/dL (ref 2.2–3.9)
IGG (IMMUNOGLOBIN G), SERUM: 865 mg/dL (ref 700–1600)
IgA, Qn, Serum: 169 mg/dL (ref 61–437)
IgM, Qn, Serum: 35 mg/dL (ref 15–143)
TOTAL PROTEIN: 6.2 g/dL (ref 6.0–8.5)

## 2017-09-20 MED ORDER — RANOLAZINE ER 500 MG PO TB12: 500.0000 mg | ORAL_TABLET | Freq: Two times a day (BID) | ORAL | 1 refills | Status: DC

## 2017-09-20 MED ORDER — ISOSORBIDE MONONITRATE ER 30 MG PO TB24: 30.0000 mg | ORAL_TABLET | Freq: Every day | ORAL | 1 refills | Status: DC

## 2017-09-20 NOTE — Patient Instructions (Addendum)
Medication Instructions:  Increase Imdur (isosorbide mononitrate) to 30 mg daily.  Dr End would like you to take  Ranexa (ranolazone) 500 mg two times a day instead of Imdur (isosorbide mononitrate). I have given you application for patient assistance-please return completed form to [facility] in our office.   Labwork: None   Testing/Procedures: None  Follow-Up: Your physician recommends that you schedule a follow-up appointment in: 1 month with Dr End or Melina Copa, PA.   Any Other Special Instructions Will Be Listed Below (If Applicable).     If you need a refill on your cardiac medications before your next appointment, please call your pharmacy.

## 2017-09-20 NOTE — Progress Notes (Signed)
Follow-up Outpatient Visit Date: 09/20/2017  Primary Care Provider: [name], MD [address] [place] [address]  Chief Complaint: chest pain and fatigue  HPI:  Charles Coffey is a 82 y.o. year-old male with history of coronary artery disease status post remote MI and CABG in 2002, ischemic cardiomyopathy, hypertension, hyperlipidemia, IBS, restless leg syndrome, BPH, GERD, renal insufficiency, anemia, thrombocytopenia who presents for follow-up of coronary artery disease. He presented preoperative evaluation and assessment of chest pain in the late August. At that time, myocardial perfusion stress test revealed scar in the distal LAD territory new ischemia in LCx territory. LVEF was also moderately reduced. Subsequent catheterization revealed severe native vessel CAD. The LAD and SVG to OM 2 were patent. Remote RCA was found to be atretic. Medical therapy was recommended. Incidentally, modest renal insufficiency and new anemia/thrombocytopenia are identified. He was last seen for follow-up by Hinton Dyer done on 08/31/17. At that time he was feeling well, though hiatal hernia surgery was on hold pending workup of his renal disease and hematologic abnormalities. He was seen by hematology last week and is undergoing further workup.  Today, Charles Coffey reports that he is still feeling somewhat fatigued. He also has intermittent upper chest pain that moves to both arms. This most often occurs when he has been lying on his back. He has previously attributed this to acid reflux and notes that it was well controlled in the past when taking Dexilant. He has decided to postpone hiatal hernia surgery indefinite period. He is currently taking isosorbide mononitrate 15 mg daily. He was unable to tolerate the 30 mg dose due to headaches. He does not think that isosorbide mononitrate affect his chest pain much. He took supplemental nitroglycerin once last week arm pain and experienced improvement. He notes  occasional shortness of breath when bending over as well as with exertion. He denies orthopnea, and PND. He has not had any significant leg edema or weight changes. He also denies palpitations and lightheadedness.  --------------------------------------------------------------------------------------------------  Cardiovascular History & Procedures: Cardiovascular Problems:  Coronary artery disease status post CABG (2002)  Ischemic cardiomyopathy  Syncope (2015 without recurrence)  Risk Factors:  Known CAD, carotid artery disease, hypertension, hyperlipidemia, age > 27  Cath/PCI:  LMCA 40% distal disease. LAD 40% proximal disease and 100% mid vessel occlusion. LCx with 40% OM1 disease and total occlusion of the mid vessel. RCA with mild diffuse disease and in-stent restenosis as well as chronic total occlusion of the distal vessel. LIMA to LAD widely patent. SVG to OM 2 patent with mild luminal irregularities. RIMA to PDA atretic. LVEDP 15-20 mmHg.  CV Surgery:  CABG (2002, [place]): LIMA->LAD, SVG->OM2, and RIMA->rPDA  EP Procedures and Devices:  None  Non-Invasive Evaluation(s):  Carotid Doppler (09/02/17): Heterogeneous plaque bilaterally with 1-39% internal carotid artery stenosis.  TTE (09/01/17): Normal LV size and wall thickness. Apical akinesis noted with LVEF of 50%. Grade 2 diastolic dysfunction. Moderately calcified aortic valve without stenosis or regurgitation. Mild annular calcification. Moderate mitral regurgitation. Mild left atrial enlargement. Normal RV size and function. Normal PA pressure.  Exercise MPI (08/23/17): High risk study. Moderate in size, severe, fixed apical anterior, septal, and mid anterior walls consistent with scar. Large, severe, reversible basal and mid inferolateral/inferior defect consistent with ischemia. LVEF 30-44%.  Bilateral lower extremity arterial studies (03/07/15): Normal ABIs (1.2 on the right, 1.1 on the left).  Recent CV Pertinent  Labs: Lab Results  Component Value Date   CHOL 135 12/01/2016  HDL 41.60 12/01/2016   LDLCALC 76 12/01/2016   TRIG 85.0 12/01/2016   CHOLHDL 3 12/01/2016   INR 1.0 08/24/2017   K 5.1 08/31/2017   BUN 22 08/31/2017   CREATININE 1.61 (H) 08/31/2017   CREATININE 1.23 (H) 05/19/2016    Past medical and surgical history were reviewed and updated in EPIC.  Current Meds  Medication Sig  . aspirin 81 MG tablet Take 81 mg by mouth daily.    . carvedilol (COREG) 6.25 MG tablet Take 1 tablet (6.25 mg total) by mouth 2 (two) times daily with a meal.  . linaclotide (LINZESS) 290 MCG CAPS capsule TAKE 1 CAPSULE (290 MCG TOTAL) BY MOUTH DAILY. 30 MINUTES PRIOR TO A MEAL  . nitroGLYCERIN (NITROSTAT) 0.4 MG SL tablet Place 1 tablet (0.4 mg total) under the tongue every 5 (five) minutes as needed.  . Plant Sterols and Stanols (CHOLESTOFF PO) Take 2 tablets by mouth 2 (two) times daily.  . pramipexole (MIRAPEX) 1 MG tablet TAKE 1 AND 1/2 TABLETS BY MOUTH AT BEDTIME  . ranitidine (ZANTAC) 300 MG tablet Take 1 tablet (300 mg total) by mouth 2 (two) times daily.  . Tetrahydroz-Glyc-Hyprom-PEG (VISINE MAXIMUM REDNESS RELIEF OP) Place 2 drops into both eyes 2 (two) times daily.  . traMADol (ULTRAM) 50 MG tablet Take 50 mg by mouth every 6 (six) hours as needed.  . [DISCONTINUED] isosorbide mononitrate (IMDUR) 30 MG 24 hr tablet Take 1 tablet (30 mg total) by mouth daily.    Allergies: Antihistamines, loratadine-type; Statins; and Pheniramine  Social History   Social History  . Marital status: Married    Spouse name: N/A  . Number of children: 5  . Years of education: Some coll   Occupational History  . retired    Social History Main Topics  . Smoking status: Former Smoker    Types: Cigarettes    Quit date: 12/28/1978  . Smokeless tobacco: Never Used  . Alcohol use 0.0 oz/week     Comment: occ-  . Drug use: No  . Sexual activity: Not on file   Other Topics Concern  . Not on file    Social History Narrative   Lives at home w/ his wife   Right-handed   5 cups of coffee per day    Family History  Problem Relation Age of Onset  . Heart disease Mother   . Heart attack Mother   . Aneurysm Father        femoral artery  . Heart disease Brother   . Heart disease Maternal Uncle        x 2  . Colon cancer Neg Hx   . Esophageal cancer Neg Hx   . Pancreatic cancer Neg Hx   . Kidney disease Neg Hx   . Liver disease Neg Hx     Review of Systems: Review of Systems  Constitutional: Positive for malaise/fatigue.  HENT: Positive for hearing loss.   Eyes: Negative.   Respiratory: Positive for shortness of breath (With exertion and when bending over).   Cardiovascular: Positive for chest pain.  Gastrointestinal: Positive for constipation and heartburn.  Genitourinary: Negative.   Musculoskeletal: Positive for back pain.  Skin: Negative.   Neurological: Positive for headaches.  Endo/Heme/Allergies: Bruises/bleeds easily.  Psychiatric/Behavioral: Negative.    --------------------------------------------------------------------------------------------------  Physical Exam: BP 124/62   Pulse 76   Ht 5\' 8"  (1.727 m)   Wt 196 lb (88.9 kg)   SpO2 96%   BMI 29.80 kg/m   General:  Overweight man, seated comfortably in the exam room. HEENT: No conjunctival pallor or scleral icterus. Moist mucous membranes.  OP clear. Neck: Supple without lymphadenopathy, thyromegaly, JVD, or HJR.  Lungs: Normal work of breathing. Clear to auscultation bilaterally without wheezes or crackles. Heart: Regular rate and rhythm without murmurs, rubs, or gallops. Non-displaced PMI. Abd: Bowel sounds present. Soft, NT/ND without hepatosplenomegaly Ext: Trace pretibial edema bilaterally. Radial, PT, and DP pulses are 2+ bilaterally. Skin: Warm and dry without rash.  Lab Results  Component Value Date   WBC 5.8 09/15/2017   HGB 10.0 (L) 09/15/2017   HCT 29.5 (L) 09/15/2017   MCV 91.3  09/15/2017   PLT 114 Large platelets present (L) 09/15/2017    Lab Results  Component Value Date   NA 139 08/31/2017   K 5.1 08/31/2017   CL 104 08/31/2017   CO2 18 (L) 08/31/2017   BUN 22 08/31/2017   CREATININE 1.61 (H) 08/31/2017   GLUCOSE 81 08/31/2017   ALT 15 12/01/2016    Lab Results  Component Value Date   CHOL 135 12/01/2016   HDL 41.60 12/01/2016   LDLCALC 76 12/01/2016   TRIG 85.0 12/01/2016   CHOLHDL 3 12/01/2016    --------------------------------------------------------------------------------------------------  ASSESSMENT AND PLAN: Coronary artery disease with stable angina Overall chest pain is unchanged. There was not significant improvement with isosorbide mononitrate, though Charles Coffey has only been able to tolerate 15 mg daily. I suspect much of his pain is also noncardiac, given its positional component and similarity to prior acid reflux. I have encouraged Charles Coffey to speak with his gastroenterologist about changing his GERD regimen. We will initiate ranolazine 500 mg twice a day. If he is unable to afford this, he could try increasing isosorbide mononitrate to 30 mg daily again now that he has become accustomed medication. We will continue his current doses of carvedilol.  Hyperlipidemia Most recent LDL in December was 76. However, in the setting of CAD, he would benefit from aggressive lipid therapy. He has been intolerant of statins in the past. He is scheduled to meet with our lipid clinic later this week to readdress initiation of a PCSK9 inhibitor.  Ischemic cardiomyopathy Charles Coffey appears largely euvolemic and well compensated on exam today. Recent echo showed low normal LVEF. I do not believe that his fatigue is entirely due to CAD and ischemic cardiomyopathy. I have a suspicion that underlying anemia and chronic kidney disease are contributing. We will continue current dose of carvedilol.  Anemia Continue workup per hematology.  Chronic  kidney disease Proceed with nephrology consultation. Avoid nephrotoxic agents.  Follow-up: Return to clinic in 1 month.  [name], MD 09/20/2017 8:43 AM

## 2017-09-20 NOTE — Addendum Note (Signed)
Addended by: Katrine Coho on: 09/20/2017 08:57 AM   Modules accepted: Orders

## 2017-09-21 ENCOUNTER — Ambulatory Visit (INDEPENDENT_AMBULATORY_CARE_PROVIDER_SITE_OTHER): Payer: Medicare HMO | Admitting: Pharmacist

## 2017-09-21 DIAGNOSIS — E785 Hyperlipidemia, unspecified: Secondary | ICD-10-CM

## 2017-09-21 NOTE — Progress Notes (Signed)
Patient ID: [name]                 DOB: Jun 09, 1935                    MRN: [id number]     HPI: Charles Coffey is a 82 y.o. male patient of DRr End referred to lipid clinic by Charles Copa PA-C.  PMH is significant for MI in 1995, CABG x 3 in 2002 and intolerance to statins, as they caused an increase in LFTs.  Zetia caused him to have muscle pain per patient recollection. Patient was previously on Repatha 140mg  every 2 weeks without issues tolerating but not able to afford this medication anymore. Since patient is watching diet closely and taking plant sterols. No other medication for lipid management at this time.  Current Medications:  CHOLESTOFF (plant sterols and stanols)  LDL goal: <70 mg/dL  Diet: low cholesterol and low sodium  Exercise: unable to exercise  much due to chronic back pain  Family History: His family history includes 2 maternal uncles who both died young from MI's (64 and 49).  His mother had multiple MI's but lived to the age of 94.  His father died at 87 from an aneurysm and 1 brother had CABG while in his 82s.   He has 2 sons who have no known cardiac disease.   Social History: occasional drinking  Labs: 08/2017 - CHO 122, TG 79, HDL 41, LDL 65 (LDL-d 72) - plant sterols 11/2016- CHO 76, TG 85, HDL 41, LDL 76 (off Repatha) 04/2016- CHO 88, TG 104, HDL 42, LDL 25 (Repatha 140mg )  05/2015- CHO 114 73, HDL 67.8, LDL 32 (Repatha 140mg , Niacin 2gm/day) 11/2014- CHO 160; TG 106; HDL 35; LDL 104  Past Medical History:  Diagnosis Date  . Aortic atherosclerosis (Charles Coffey)   . BPH with urinary obstruction   . CAD (coronary artery disease)    a.  MI 1995, CABG x 3 2002 (patient says that he had LIMA and RIMA grafts). b. ETT-Cardiolite (10/15) with EF 48%, apical scar, no ischemia. c. Nuc 07/2017 abnormal -> cath was performed,  patent LIMA to LAD and SVG to OM 2. RIMA to RCA is atretic. The right coronary artery is occluded distally with extensive collaterals from the  LAD, medical therapy.   . Cervical spondylosis without myelopathy 2020/01/2016  . Chronic low back pain    sees Dr. Kary Coffey   . GERD (gastroesophageal reflux disease)   . Hiatal hernia   . Hyperlipidemia   . Hypertension   . IBS (irritable bowel syndrome)   . Ischemic cardiomyopathy   . Myocardial infarction (Charles Coffey)   . RBBB   . Restless legs syndrome   . Statin intolerance   . Syncope    a. in 2015 - no apparent cause, was taking sleep medicine at the time. Cardiac workup unremarkable.  . Tubular adenoma of colon     Current Outpatient Prescriptions on File Prior to Visit  Medication Sig Dispense Refill  . aspirin 81 MG tablet Take 81 mg by mouth daily.      . carvedilol (COREG) 6.25 MG tablet Take 1 tablet (6.25 mg total) by mouth 2 (two) times daily with a meal. 60 tablet 0  . isosorbide mononitrate (IMDUR) 30 MG 24 hr tablet Take 1 tablet (30 mg total) by mouth daily. 90 tablet 1  . linaclotide (LINZESS) 290 MCG CAPS capsule TAKE 1 CAPSULE (290 MCG TOTAL) BY MOUTH  DAILY. 30 MINUTES PRIOR TO A MEAL 30 capsule 10  . nitroGLYCERIN (NITROSTAT) 0.4 MG SL tablet Place 1 tablet (0.4 mg total) under the tongue every 5 (five) minutes as needed. 25 tablet 3  . Plant Sterols and Stanols (CHOLESTOFF PO) Take 2 tablets by mouth 2 (two) times daily.    . pramipexole (MIRAPEX) 1 MG tablet TAKE 1 AND 1/2 TABLETS BY MOUTH AT BEDTIME 45 tablet 11  . ranitidine (ZANTAC) 300 MG tablet Take 1 tablet (300 mg total) by mouth 2 (two) times daily. 60 tablet 3  . Tetrahydroz-Glyc-Hyprom-PEG (VISINE MAXIMUM REDNESS RELIEF OP) Place 2 drops into both eyes 2 (two) times daily.    . traMADol (ULTRAM) 50 MG tablet Take 50 mg by mouth every 6 (six) hours as needed.     No current facility-administered medications on file prior to visit.     Allergies  Allergen Reactions  . Antihistamines, Loratadine-Type Other (See Comments)    Unable to urinate  . Statins Other (See Comments)    liver effects  .  Pheniramine Rash    Not specific on what type of antihistamines---Causes urinary retention    Hyperlipidemia LDL goal <70 LDL-direct slightly above desired goal of 70mg /dL for secondary prevention. Patient previously on [place] with LDL =25 mg/dL during treatment. His new insurance prefer PCKS9i is Praluent. Will continue current therapy with plant sterols and start paperwork for Praluent 150mg .   Praleunt indication, administration, storage and financial responsibility discussed with patient during appointment. He is NOT a candidate for patient assistance program due to income.  Charles Coffey PharmD, BCPS, [facility] [address] [place] [address] 09/22/2017 3:30 PM

## 2017-09-22 LAB — LIPID PANEL
Chol/HDL Ratio: 3 ratio (ref 0.0–5.0)
Cholesterol, Total: 122 mg/dL (ref 100–199)
HDL: 41 mg/dL (ref >39)
LDL Calculated: 65 mg/dL (ref 0–99)
Triglycerides: 79 mg/dL (ref 0–149)
VLDL CHOLESTEROL CAL: 16 mg/dL (ref 5–40)

## 2017-09-22 LAB — LDL CHOLESTEROL, DIRECT: LDL DIRECT: 72 mg/dL (ref 0–99)

## 2017-09-22 NOTE — Assessment & Plan Note (Signed)
LDL-direct slightly above desired goal of 70mg /dL for secondary prevention. Patient previously on [place] with LDL =25 mg/dL during treatment. His new insurance prefer PCKS9i is Praluent. Will continue current therapy with plant sterols and start paperwork for Praluent 150mg .   Praleunt indication, administration, storage and financial responsibility discussed with patient during appointment. He is NOT a candidate for patient assistance program due to income.

## 2017-09-22 NOTE — Patient Instructions (Signed)
Cholesterol Cholesterol is a fat. Your body needs a small amount of cholesterol. Cholesterol (plaque) may build up in your blood vessels (arteries). That makes you more likely to have a heart attack or stroke. You cannot feel your cholesterol level. Having a blood test is the only way to find out if your level is high. Keep your test results. Work with your doctor to keep your cholesterol at a good level. What do the results mean?  Total cholesterol is how much cholesterol is in your blood.  LDL is bad cholesterol. This is the type that can build up. Try to have low LDL.  HDL is good cholesterol. It cleans your blood vessels and carries LDL away. Try to have high HDL.  Triglycerides are fat that the body can store or burn for energy. What are good levels of cholesterol?  Total cholesterol below 200.  LDL below 100 is good for people who have health risks. LDL below 70 is good for people who have very high risks.  HDL above 40 is good. It is best to have HDL of 60 or higher.  Triglycerides below 150. How can I lower my cholesterol? Diet  Follow your diet program as told by your doctor.  Choose fish, white meat chicken, or turkey that is roasted or baked. Try not to eat red meat, fried foods, sausage, or lunch meats.  Eat lots of fresh fruits and vegetables.  Choose whole grains, beans, pasta, potatoes, and cereals.  Choose olive oil, corn oil, or canola oil. Only use small amounts.  Try not to eat butter, mayonnaise, shortening, or palm kernel oils.  Try not to eat foods with trans fats.  Choose low-fat or nonfat dairy foods.  Drink skim or nonfat milk.  Eat low-fat or nonfat yogurt and cheeses.  Try not to drink whole milk or cream.  Try not to eat ice cream, egg yolks, or full-fat cheeses.  Healthy desserts include angel food cake, ginger snaps, animal crackers, hard candy, popsicles, and low-fat or nonfat frozen yogurt. Try not to eat pastries, cakes, pies, and  cookies. Exercise  Follow your exercise program as told by your doctor.  Be more active. Try gardening, walking, and taking the stairs.  Ask your doctor about ways that you can be more active. Medicine  Take over-the-counter and prescription medicines only as told by your doctor. This information is not intended to replace advice given to you by your health care provider. Make sure you discuss any questions you have with your health care provider. Document Released: 03/12/2009 Document Revised: 07/15/2016 Document Reviewed: 06/25/2016 Elsevier Interactive Patient Education  2017 Elsevier Inc.  

## 2017-09-23 ENCOUNTER — Encounter: Payer: Medicare HMO | Admitting: Oncology

## 2017-09-29 ENCOUNTER — Other Ambulatory Visit: Payer: Self-pay | Admitting: Pharmacist

## 2017-09-29 MED ORDER — ALIROCUMAB 150 MG/ML ~~LOC~~ SOPN: 150.0000 mg | PEN_INJECTOR | SUBCUTANEOUS | 11 refills | Status: DC

## 2017-09-29 NOTE — Telephone Encounter (Signed)
Prior authorization approved by insurance. Rx sent to specialty pharmacy

## 2017-09-30 ENCOUNTER — Telehealth: Payer: Self-pay | Admitting: *Deleted

## 2017-09-30 NOTE — Telephone Encounter (Signed)
Received patient assistance application for RANEXA from [facility] office for Dr End to sign. Patient is presently off this medication because of cost. Will place application in Dr Marisue Humble box to see if he wants to try for assistance.

## 2017-10-01 NOTE — Telephone Encounter (Signed)
Dr End signed Ranexa Connect application and I have faxed it to [employer].

## 2017-10-04 ENCOUNTER — Other Ambulatory Visit: Payer: Self-pay | Admitting: Internal Medicine

## 2017-10-04 ENCOUNTER — Ambulatory Visit
Admission: RE | Admit: 2017-10-04 | Discharge: 2017-10-04 | Disposition: A | Discharge status: Home / Self Care | Payer: Medicare HMO | Source: Ambulatory Visit | Attending: Neurosurgery | Admitting: Neurosurgery

## 2017-10-04 DIAGNOSIS — M4316 Spondylolisthesis, lumbar region: Secondary | ICD-10-CM

## 2017-10-04 DIAGNOSIS — M47817 Spondylosis without myelopathy or radiculopathy, lumbosacral region: Secondary | ICD-10-CM | POA: Diagnosis not present

## 2017-10-04 MED ORDER — IOPAMIDOL (ISOVUE-M 200) INJECTION 41%
1.0000 mL | Freq: Once | INTRAMUSCULAR | Status: AC
  Administered 2017-10-04: 1 mL via INTRA_ARTICULAR

## 2017-10-04 MED ORDER — METHYLPREDNISOLONE ACETATE 40 MG/ML INJ SUSP (RADIOLOG
120.0000 mg | Freq: Once | INTRAMUSCULAR | Status: AC
  Administered 2017-10-04: 120 mg via INTRA_ARTICULAR

## 2017-10-04 NOTE — Discharge Instructions (Signed)
Joint Injection Discharge Instructions  1. After your joint injection, use ice to the affected area for the next 24 hours as a temporary increase in pain is not uncommon for a day or two after your procedure.  2. Resume all medications unless otherwise instructed.  3. Common side effects of steroids include facial flushing or redness, restlessness or inability to sleep and an increase in your blood sugar if you are a diabetic.    4. Follow up with the ordering physician for post care.  5. If you have any of the following please call [phone]:      Temperature greater than 101     Pain, redness or swelling at the injection site  

## 2017-10-06 DIAGNOSIS — L719 Rosacea, unspecified: Secondary | ICD-10-CM | POA: Diagnosis not present

## 2017-10-06 DIAGNOSIS — L570 Actinic keratosis: Secondary | ICD-10-CM | POA: Diagnosis not present

## 2017-10-11 ENCOUNTER — Telehealth: Payer: Self-pay | Admitting: Pharmacist

## 2017-10-11 NOTE — Telephone Encounter (Signed)
Talked to patient today. Praluent prio-auth approved since 09/29/2017. Rx sent to [facility] but no information received yet with co-pay information or to arrange delivery.   [facility] and the will be calling patient in next 48hrs to arrange delivery.

## 2017-10-12 DIAGNOSIS — H938X3 Other specified disorders of ear, bilateral: Secondary | ICD-10-CM | POA: Diagnosis not present

## 2017-10-12 DIAGNOSIS — H9113 Presbycusis, bilateral: Secondary | ICD-10-CM | POA: Diagnosis not present

## 2017-10-12 DIAGNOSIS — M5030 Other cervical disc degeneration, unspecified cervical region: Secondary | ICD-10-CM | POA: Diagnosis not present

## 2017-10-12 DIAGNOSIS — H9313 Tinnitus, bilateral: Secondary | ICD-10-CM | POA: Diagnosis not present

## 2017-10-12 NOTE — Telephone Encounter (Signed)
The pt has been denied pt assistance for Renexa through [employer]. Reason: Per Jeani Hawking at [place] (the pt makes too much money to be considered for pt assistance).  Jeani Hawking states that they have mailed the same denial letter to the pts address.

## 2017-10-15 DIAGNOSIS — N183 Chronic kidney disease, stage 3 (moderate): Secondary | ICD-10-CM | POA: Diagnosis not present

## 2017-10-19 ENCOUNTER — Other Ambulatory Visit: Payer: Self-pay | Admitting: Nephrology

## 2017-10-19 DIAGNOSIS — N183 Chronic kidney disease, stage 3 unspecified: Secondary | ICD-10-CM

## 2017-10-25 ENCOUNTER — Telehealth: Payer: Self-pay | Admitting: *Deleted

## 2017-10-25 NOTE — Telephone Encounter (Signed)
Received call from patient stating,"I saw Dr. [name] on 09/15/17 as a new patient and had labs drawn. No one called me with lab results. Also, what is the plan? I don't see Dr. [name] until December 19th and that is way to long to wait to see what is going on with me. My return number is [phone]."

## 2017-10-25 NOTE — Telephone Encounter (Signed)
As noted below by Dr. [name], I informed patient that his labs were all normal. No intervention is needed at this time. Dr. [name] is going to monitor his counts and repeat them in December at his scheduled appointments. Patient verbalized understanding.

## 2017-10-25 NOTE — Telephone Encounter (Signed)
Please let him know all his blood work is [place] and will be repeated in 11/2017 with his next visit.

## 2017-10-26 ENCOUNTER — Ambulatory Visit
Admission: RE | Admit: 2017-10-26 | Discharge: 2017-10-26 | Disposition: A | Discharge status: Home / Self Care | Payer: Medicare HMO | Source: Ambulatory Visit | Attending: Nephrology | Admitting: Nephrology

## 2017-10-26 DIAGNOSIS — N183 Chronic kidney disease, stage 3 unspecified: Secondary | ICD-10-CM

## 2017-10-26 DIAGNOSIS — N189 Chronic kidney disease, unspecified: Secondary | ICD-10-CM | POA: Diagnosis not present

## 2017-10-28 ENCOUNTER — Ambulatory Visit (INDEPENDENT_AMBULATORY_CARE_PROVIDER_SITE_OTHER): Payer: Medicare HMO | Admitting: Internal Medicine

## 2017-10-28 ENCOUNTER — Encounter: Payer: Self-pay | Admitting: Internal Medicine

## 2017-10-28 VITALS — BP 118/62 | HR 69 | Resp 16 | Ht 68.5 in | Wt 195.1 lb

## 2017-10-28 DIAGNOSIS — I10 Essential (primary) hypertension: Secondary | ICD-10-CM | POA: Diagnosis not present

## 2017-10-28 DIAGNOSIS — M48062 Spinal stenosis, lumbar region with neurogenic claudication: Secondary | ICD-10-CM | POA: Diagnosis not present

## 2017-10-28 DIAGNOSIS — I255 Ischemic cardiomyopathy: Secondary | ICD-10-CM | POA: Diagnosis not present

## 2017-10-28 DIAGNOSIS — I25118 Atherosclerotic heart disease of native coronary artery with other forms of angina pectoris: Secondary | ICD-10-CM | POA: Diagnosis not present

## 2017-10-28 DIAGNOSIS — E785 Hyperlipidemia, unspecified: Secondary | ICD-10-CM

## 2017-10-28 MED ORDER — ISOSORBIDE MONONITRATE ER 60 MG PO TB24: 60.0000 mg | ORAL_TABLET | Freq: Every day | ORAL | 1 refills | Status: DC

## 2017-10-28 NOTE — Progress Notes (Signed)
Follow-up Outpatient Visit Date: 10/28/2017  Primary Care Provider: [name], MD [address] [place] [address]  Chief Complaint: Chest pain  HPI:  Charles Coffey is a 82 y.o. year-old male with history of coronary artery disease status post remote MI and CABG in 2002, ischemic cardiomyopathy, hypertension, hyperlipidemia, IBS, restless leg syndrome, BPH, GERD, renal insufficiency, anemia, and thrombocytopenia, who presents for follow-up of chest pain. I last saw him on 9/24, at which time Charles Coffey reported continued fatigue and intermittent chest pain following catheterization that revealed severe native CAD, patent LIMA->LAD and SVG->OM2, and atretic RIMA->RCA. We decided to switch from isosorbide mononitrate to ranolazine 500 mg BID, as Charles Coffey was intolerant of higher doses of isosorbide mononitrate due to headaches. We were also concerned about the potential for GERD contributing to his chest pain. Charles Coffey was also referred to the lipid clinic for consideration of PCSK9 therapy, given intolerance of multiple statins in the past. He was prescribed Praluent but was unable to get this prescription filled due to cost. He inquires today about trying Repatha again, as he heard that the Price has recently dropped.  After our last visit, Charles Coffey could not afford ranolazine. He therefore increased isosorbide mononitrate to 30 mg daily. He noted a headache for a few days but has tolerated the medication well since then. At times, he is even taken an additional 15 mg dose in the evenings. He feels as though his shoulder and arm discomfort, which has been an anginal equivalent for him, has improved. He is able to walk a little bit more before he develops the discomfort. He has not had any symptoms at rest. He denies shortness of breath, lightheadedness, and edema.  Charles Coffey was evaluated by Dr. Florene Glen (nephrology) yesterday and has been referred to his urologist for evaluation  of possible unilateral ureteral obstruction. Of note, Charles Coffey reports that his blood pressure was mildly elevated yesterday when he saw Dr. Florene Glen.  --------------------------------------------------------------------------------------------------  Cardiovascular History & Procedures: Cardiovascular Problems:  Coronary artery disease status post CABG (2002)  Ischemic cardiomyopathy  Syncope (2015 without recurrence)  Risk Factors:  Known CAD, carotid artery disease, hypertension, hyperlipidemia, age > 14  Cath/PCI:  LMCA 40% distal disease. LAD 40% proximal disease and 100% mid vessel occlusion. LCx with 40% OM1 disease and total occlusion of the mid vessel. RCA with mild diffuse disease and in-stent restenosis as well as chronic total occlusion of the distal vessel. LIMA to LAD widely patent. SVG to OM 2 patent with mild luminal irregularities. RIMA to PDA atretic. LVEDP 15-20 mmHg.  CV Surgery:  CABG (2002, [place]): LIMA->LAD, SVG->OM2, and RIMA->rPDA  EP Procedures and Devices:  None  Non-Invasive Evaluation(s):  Carotid Doppler (09/02/17): Heterogeneous plaque bilaterally with 1-39% internal carotid artery stenosis.  TTE (09/01/17): Normal LV size and wall thickness. Apical akinesis noted with LVEF of 50%. Grade 2 diastolic dysfunction. Moderately calcified aortic valve without stenosis or regurgitation. Mild annular calcification. Moderate mitral regurgitation. Mild left atrial enlargement. Normal RV size and function. Normal PA pressure.  Exercise MPI (08/23/17): High risk study. Moderate in size, severe, fixed apical anterior, septal, and mid anterior walls consistent with scar. Large, severe, reversible basal and mid inferolateral/inferior defect consistent with ischemia. LVEF 30-44%.  Bilateral lower extremity arterial studies (03/07/15): Normal ABIs (1.2 on the right, 1.1 on the left).  Recent CV Pertinent Labs: Lab Results  Component Value Date   CHOL 122  09/21/2017   HDL 41 09/21/2017  LDLCALC 65 09/21/2017   LDLDIRECT 72 09/21/2017   TRIG 79 09/21/2017   CHOLHDL 3.0 09/21/2017   CHOLHDL 3 12/01/2016   INR 1.0 08/24/2017   K 5.1 08/31/2017   BUN 22 08/31/2017   CREATININE 1.61 (H) 08/31/2017   CREATININE 1.23 (H) 05/19/2016    Past medical and surgical history were reviewed and updated in EPIC.  Current Meds  Medication Sig  . Alirocumab (PRALUENT) 150 MG/ML SOPN Inject 150 mg into the skin every 14 (fourteen) days.  [place] Kitchen aspirin 81 MG tablet Take 81 mg by mouth daily.    . carvedilol (COREG) 6.25 MG tablet Take 1 tablet (6.25 mg total) by mouth 2 (two) times daily with a meal.  . diphenhydramine-acetaminophen (TYLENOL PM EXTRA STRENGTH) 25-500 MG TABS tablet Take 1 tablet by mouth at bedtime as needed.  . linaclotide (LINZESS) 290 MCG CAPS capsule TAKE 1 CAPSULE (290 MCG TOTAL) BY MOUTH DAILY. 30 MINUTES PRIOR TO A MEAL  . nitroGLYCERIN (NITROSTAT) 0.4 MG SL tablet Place 1 tablet (0.4 mg total) under the tongue every 5 (five) minutes as needed.  . Plant Sterols and Stanols (CHOLESTOFF PO) Take 2 tablets by mouth 2 (two) times daily.  . pramipexole (MIRAPEX) 1 MG tablet TAKE 1 AND 1/2 TABLETS BY MOUTH AT BEDTIME  . ranitidine (ZANTAC) 300 MG tablet TAKE 1 TABLET BY MOUTH TWICE A DAY  . Tetrahydroz-Glyc-Hyprom-PEG (VISINE MAXIMUM REDNESS RELIEF OP) Place 2 drops into both eyes 2 (two) times daily.  . traMADol (ULTRAM) 50 MG tablet Take 50 mg by mouth every 6 (six) hours as needed.  . [DISCONTINUED] isosorbide mononitrate (IMDUR) 30 MG 24 hr tablet Take 1 tablet (30 mg total) by mouth daily.    Allergies: Antihistamines, loratadine-type and Statins  Social History   Social History  . Marital status: Married    Spouse name: N/A  . Number of children: 5  . Years of education: Some coll   Occupational History  . retired    Social History Main Topics  . Smoking status: Former Smoker    Types: Cigarettes    Quit date: 12/28/1978    . Smokeless tobacco: Never Used  . Alcohol use 0.0 oz/week     Comment: occ-  . Drug use: No  . Sexual activity: Not on file   Other Topics Concern  . Not on file   Social History Narrative   Lives at home w/ his wife   Right-handed   5 cups of coffee per day    Family History  Problem Relation Age of Onset  . Heart disease Mother   . Heart attack Mother   . Aneurysm Father        femoral artery  . Heart disease Brother   . Heart disease Maternal Uncle        x 2  . Colon cancer Neg Hx   . Esophageal cancer Neg Hx   . Pancreatic cancer Neg Hx   . Kidney disease Neg Hx   . Liver disease Neg Hx     Review of Systems: A 12-system review of systems was performed and was negative except as noted in the HPI.  --------------------------------------------------------------------------------------------------  Physical Exam: BP 118/62   Pulse 69   Resp 16   Ht 5' 8.5" (1.74 m)   Wt 195 lb 2 oz (88.5 kg)   SpO2 97%   BMI 29.24 kg/m   General:  Well-developed, well-nourished elderly man, seated comfortably in the exam room. He is accompanied  by his wife. HEENT: No conjunctival pallor or scleral icterus. Moist mucous membranes.  OP clear. Neck: Supple without lymphadenopathy, thyromegaly, JVD, or HJR. Lungs: Normal work of breathing. Clear to auscultation bilaterally without wheezes or crackles. Heart: Regular rate and rhythm without murmurs, rubs, or gallops. Non-displaced PMI. Abd: Bowel sounds present. Soft, NT/ND without hepatosplenomegaly Ext: Trace ankle edema. Skin: Warm and dry without rash.  Lab Results  Component Value Date   WBC 5.8 09/15/2017   HGB 10.0 (L) 09/15/2017   HCT 29.5 (L) 09/15/2017   MCV 91.3 09/15/2017   PLT 114 Large platelets present (L) 09/15/2017    Lab Results  Component Value Date   NA 139 08/31/2017   K 5.1 08/31/2017   CL 104 08/31/2017   CO2 18 (L) 08/31/2017   BUN 22 08/31/2017   CREATININE 1.61 (H) 08/31/2017   GLUCOSE  81 08/31/2017   ALT 15 12/01/2016    Lab Results  Component Value Date   CHOL 122 09/21/2017   HDL 41 09/21/2017   LDLCALC 65 09/21/2017   LDLDIRECT 72 09/21/2017   TRIG 79 09/21/2017   CHOLHDL 3.0 09/21/2017    --------------------------------------------------------------------------------------------------  ASSESSMENT AND PLAN: Coronary artery disease with stable angina Unfortunately, Charles Coffey was unable to afford ranolazine. He has tolerated increased dose of isosorbide mononitrate with improvement in his exertional angina. We have agreed to increase isosorbide mononitrate to 60 mg daily. He should let us know if he has an intractable headache or develops lightheadedness, given lower normal blood pressure today. We will continue with aspirin and carvedilol. I have encouraged Charles Coffey to increase his activity gradually.  Ischemic cardiomyopathy Charles Coffey appears euvolemic and well compensated today. We will continue his current dose of carvedilol. I will defer adding an ACE inhibitor/ARB at this time given almost normal LVEF (50% by echo) and chronic kidney disease stage III.  Hyperlipidemia Most recent LDL in September was 72. Charles Coffey was evaluated in the lipid clinic but has been unable to purchase Praluent due to cost. I will touch base with the lipid clinic about affordability of Repatha. Unfortunately, Charles Coffey has been intolerant of multiple statins and ezetimibe in the past.  Hypertension Blood pressure is well controlled today. Charles Coffey notes that it was elevated yesterday at his nephrology visit. We will increase isosorbide mononitrate, as discussed above.  Follow-up: Return to clinic in 3-4 months.  [name], MD 10/28/2017 10:05 AM

## 2017-10-28 NOTE — Patient Instructions (Signed)
Medication Instructions:  Increase Imdur (isosorbide) to 60 mg daily. You can take 2 of your 30 mg tablets daily at the same time and use your current supply.  Labwork: None   Testing/Procedures: None   Follow-Up: Your physician recommends that you schedule a follow-up appointment in: 3 -4 months with Dr End.     If you need a refill on your cardiac medications before your next appointment, please call your pharmacy.

## 2017-11-03 ENCOUNTER — Encounter: Payer: Self-pay | Admitting: Internal Medicine

## 2017-11-03 DIAGNOSIS — N401 Enlarged prostate with lower urinary tract symptoms: Secondary | ICD-10-CM | POA: Diagnosis not present

## 2017-11-03 DIAGNOSIS — N130 Hydronephrosis with ureteropelvic junction obstruction: Secondary | ICD-10-CM | POA: Diagnosis not present

## 2017-11-03 DIAGNOSIS — R350 Frequency of micturition: Secondary | ICD-10-CM | POA: Diagnosis not present

## 2017-11-08 ENCOUNTER — Other Ambulatory Visit (HOSPITAL_COMMUNITY): Payer: Self-pay | Admitting: Cardiology

## 2017-11-11 ENCOUNTER — Other Ambulatory Visit: Payer: Self-pay | Admitting: Pharmacist

## 2017-11-11 DIAGNOSIS — N132 Hydronephrosis with renal and ureteral calculous obstruction: Secondary | ICD-10-CM | POA: Diagnosis not present

## 2017-11-11 DIAGNOSIS — E1129 Type 2 diabetes mellitus with other diabetic kidney complication: Secondary | ICD-10-CM | POA: Diagnosis not present

## 2017-11-11 DIAGNOSIS — N130 Hydronephrosis with ureteropelvic junction obstruction: Secondary | ICD-10-CM | POA: Diagnosis not present

## 2017-11-11 DIAGNOSIS — R69 Illness, unspecified: Secondary | ICD-10-CM | POA: Diagnosis not present

## 2017-11-11 DIAGNOSIS — J45909 Unspecified asthma, uncomplicated: Secondary | ICD-10-CM | POA: Diagnosis not present

## 2017-11-11 MED ORDER — EVOLOCUMAB 140 MG/ML ~~LOC~~ SOAJ: 140.0000 mg | SUBCUTANEOUS | 11 refills | Status: DC

## 2017-11-15 ENCOUNTER — Other Ambulatory Visit: Payer: Self-pay | Admitting: Urology

## 2017-11-15 DIAGNOSIS — R972 Elevated prostate specific antigen [PSA]: Secondary | ICD-10-CM | POA: Diagnosis not present

## 2017-11-15 DIAGNOSIS — N401 Enlarged prostate with lower urinary tract symptoms: Secondary | ICD-10-CM | POA: Diagnosis not present

## 2017-11-15 DIAGNOSIS — R3914 Feeling of incomplete bladder emptying: Secondary | ICD-10-CM | POA: Diagnosis not present

## 2017-11-15 DIAGNOSIS — N130 Hydronephrosis with ureteropelvic junction obstruction: Secondary | ICD-10-CM | POA: Diagnosis not present

## 2017-11-16 ENCOUNTER — Telehealth: Payer: Self-pay | Admitting: Internal Medicine

## 2017-11-16 NOTE — Telephone Encounter (Signed)
° °  [facility] Pre-operative Risk Assessment    Request for surgical clearance:  1. What type of surgery is being performed? TUR of the Bladder Tumor  2. When is this surgery scheduled? 11-25-17   3. Are there any medications that need to be held prior to surgery and how long?General Cardiac Clearance-Pt is on Aspirin   4. Practice name and name of physician performing surgery? Dr Wille Glaser [facility]    5. What is your office phone and fax number? [phone] and fax is [phone]   6. Anesthesia type (None, local, MAC, general) ? [facility]   Charles Coffey 11/16/2017, 8:31 AM  _________________________________________________________________   (provider comments below)

## 2017-11-17 NOTE — Telephone Encounter (Signed)
    Chart reviewed as part of pre-operative protocol coverage. Complex patient with stable angina, cath in 07/2018 with significant underlying disease but no targets for PCI. He has been managed more recently by Dr. Saunders Revel with increases in anti-anginal therapy (Imdur doubled). Discussed with Dr. Saunders Revel. There is no further cardiovascular testing that would be warranted at this time. With underlying comorbidities patient is at higher risk for complications from surgery but he is felt at this time acceptable to proceed without further testing. Spoke with patient who states he has not had any further shoulder pain, chest pain or dyspnea since increasing Imdur. Dr. Saunders Revel feels he may hold aspirin for 5 days prior to surgery.  Will route this bundled recommendation to requesting provider via Epic fax function. Please call with questions.   Charlie Pitter, PA-C 11/17/2017, 1:23 PM

## 2017-11-17 NOTE — Telephone Encounter (Signed)
Agree with Ms. Trish Fountain recommendations.  [name], MD [facility] Pager: [phone]

## 2017-11-23 ENCOUNTER — Telehealth: Payer: Self-pay | Admitting: Internal Medicine

## 2017-11-23 ENCOUNTER — Telehealth: Payer: Self-pay | Admitting: *Deleted

## 2017-11-23 MED ORDER — LUBIPROSTONE 24 MCG PO CAPS: 24.0000 ug | ORAL_CAPSULE | Freq: Two times a day (BID) | ORAL | 3 refills | Status: DC

## 2017-11-23 NOTE — Telephone Encounter (Signed)
Pt states the linzess 263mcg is not helping anymore. He took the last one this morning. Reports the last "real BM" he had was a week ago. Pt wanting to know what else he can take for constipation. Please advise.

## 2017-11-23 NOTE — Telephone Encounter (Signed)
Prior Authorization approval for REPATHA received. Period of coverage is from 12/26/2016 to 12/27/2017.  Referral number [id number].

## 2017-11-23 NOTE — Telephone Encounter (Signed)
Spoke with pt and he is aware, script sent to pharmacy. 

## 2017-11-23 NOTE — Telephone Encounter (Signed)
Per Raquel's most recent note, PA was submitted for Praluent rather than Repatha. Will forward to her for f/u.

## 2017-11-23 NOTE — Progress Notes (Signed)
11-17-17 (Epic) Cardiac Clearance from Melina Copa, PAC in Telephone Encounter   09-01-17 (Epic) ECHO  08-23-17 (Epic) Stress  08-19-17 (Epic) EKG

## 2017-11-23 NOTE — Telephone Encounter (Signed)
Patient unable to afford Praluent.   We are trying Repatha new NDC to sees if patient able to afford copay.  Thank you

## 2017-11-23 NOTE — Telephone Encounter (Signed)
Change linzess to amitiza 24 mcg BID (with food) Discontinue linzess for now Have him let me know how it works

## 2017-11-23 NOTE — Patient Instructions (Addendum)
Charles Coffey  11/23/2017   Your procedure is scheduled on: 11-25-17   Report to [facility] Main  Entrance Take [place] Elevators to 3rd floor to  [place] at 10:15 AM.    Call this number if you have problems the morning of surgery [phone]   Remember: ONLY 1 PERSON MAY GO WITH YOU TO SHORT STAY TO GET  READY MORNING OF [place].  Do not eat food or drink liquids :After Midnight.     Take these medicines the morning of surgery with A SIP OF WATER: Carvedilol (Coreg), Isosorbide Mononitrate (Imdur), and Ranitidine (Zantac). You may also bring and use your eyedrops.                                You may not have any metal on your body including hair pins and              piercings  Do not wear jewelry, lotions, powders, or deodorant             Men may shave face and neck.   Do not bring valuables to the hospital. [place].  Contacts, dentures or bridgework may not be worn into surgery.  Leave suitcase in the car. After surgery it may be brought to your room.                      Driver:  [name] [phone]               Please read over the following fact sheets you were given: _____________________________________________________________________             [facility] - Preparing for Surgery Before surgery, you can play an important role.  Because skin is not sterile, your skin needs to be as free of germs as possible.  You can reduce the number of germs on your skin by washing with CHG (chlorahexidine gluconate) soap before surgery.  CHG is an antiseptic cleaner which kills germs and bonds with the skin to continue killing germs even after washing. Please DO NOT use if you have an allergy to CHG or antibacterial soaps.  If your skin becomes reddened/irritated stop using the CHG and inform your nurse when you arrive at Short Stay. Do not shave (including legs and underarms) for at  least 48 hours prior to the first CHG shower.  You may shave your face/neck. Please follow these instructions carefully:  1.  Shower with CHG Soap the night before surgery and the  morning of Surgery.  2.  If you choose to wash your hair, wash your hair first as usual with your  normal  shampoo.  3.  After you shampoo, rinse your hair and body thoroughly to remove the  shampoo.                           4.  Use CHG as you would any other liquid soap.  You can apply chg directly  to the skin and wash                       Gently with a scrungie or  clean washcloth.  5.  Apply the CHG Soap to your body ONLY FROM THE NECK DOWN.   Do not use on face/ open                           Wound or open sores. Avoid contact with eyes, ears mouth and genitals (private parts).                       Wash face,  Genitals (private parts) with your normal soap.             6.  Wash thoroughly, paying special attention to the area where your surgery  will be performed.  7.  Thoroughly rinse your body with warm water from the neck down.  8.  DO NOT shower/wash with your normal soap after using and rinsing off  the CHG Soap.                9.  Pat yourself dry with a clean towel.            10.  Wear clean pajamas.            11.  Place clean sheets on your bed the night of your first shower and do not  sleep with pets. Day of Surgery : Do not apply any lotions/deodorants the morning of surgery.  Please wear clean clothes to the hospital/surgery center.  FAILURE TO FOLLOW THESE INSTRUCTIONS MAY RESULT IN THE CANCELLATION OF YOUR SURGERY PATIENT SIGNATURE_________________________________  NURSE SIGNATURE__________________________________  ________________________________________________________________________

## 2017-11-24 ENCOUNTER — Other Ambulatory Visit: Payer: Self-pay

## 2017-11-24 ENCOUNTER — Encounter (HOSPITAL_COMMUNITY)
Admission: RE | Admit: 2017-11-24 | Discharge: 2017-11-24 | Disposition: A | Discharge status: Home / Self Care | Payer: Medicare HMO | Source: Ambulatory Visit | Attending: Urology | Admitting: Urology

## 2017-11-24 ENCOUNTER — Encounter (HOSPITAL_COMMUNITY): Payer: Self-pay

## 2017-11-24 DIAGNOSIS — C679 Malignant neoplasm of bladder, unspecified: Secondary | ICD-10-CM | POA: Insufficient documentation

## 2017-11-24 DIAGNOSIS — I252 Old myocardial infarction: Secondary | ICD-10-CM

## 2017-11-24 DIAGNOSIS — Z7982 Long term (current) use of aspirin: Secondary | ICD-10-CM | POA: Insufficient documentation

## 2017-11-24 DIAGNOSIS — K589 Irritable bowel syndrome without diarrhea: Secondary | ICD-10-CM | POA: Insufficient documentation

## 2017-11-24 DIAGNOSIS — I452 Bifascicular block: Secondary | ICD-10-CM | POA: Diagnosis present

## 2017-11-24 DIAGNOSIS — N320 Bladder-neck obstruction: Secondary | ICD-10-CM | POA: Insufficient documentation

## 2017-11-24 DIAGNOSIS — Z79899 Other long term (current) drug therapy: Secondary | ICD-10-CM

## 2017-11-24 DIAGNOSIS — Z87891 Personal history of nicotine dependence: Secondary | ICD-10-CM

## 2017-11-24 DIAGNOSIS — Z951 Presence of aortocoronary bypass graft: Secondary | ICD-10-CM | POA: Insufficient documentation

## 2017-11-24 DIAGNOSIS — G8929 Other chronic pain: Secondary | ICD-10-CM | POA: Insufficient documentation

## 2017-11-24 DIAGNOSIS — E785 Hyperlipidemia, unspecified: Secondary | ICD-10-CM

## 2017-11-24 DIAGNOSIS — I25118 Atherosclerotic heart disease of native coronary artery with other forms of angina pectoris: Secondary | ICD-10-CM | POA: Diagnosis present

## 2017-11-24 DIAGNOSIS — Z8249 Family history of ischemic heart disease and other diseases of the circulatory system: Secondary | ICD-10-CM

## 2017-11-24 DIAGNOSIS — K219 Gastro-esophageal reflux disease without esophagitis: Secondary | ICD-10-CM

## 2017-11-24 DIAGNOSIS — N183 Chronic kidney disease, stage 3 (moderate): Secondary | ICD-10-CM | POA: Diagnosis present

## 2017-11-24 DIAGNOSIS — I255 Ischemic cardiomyopathy: Secondary | ICD-10-CM | POA: Insufficient documentation

## 2017-11-24 DIAGNOSIS — I739 Peripheral vascular disease, unspecified: Secondary | ICD-10-CM | POA: Diagnosis present

## 2017-11-24 DIAGNOSIS — I2510 Atherosclerotic heart disease of native coronary artery without angina pectoris: Secondary | ICD-10-CM

## 2017-11-24 DIAGNOSIS — I4510 Unspecified right bundle-branch block: Secondary | ICD-10-CM | POA: Diagnosis present

## 2017-11-24 DIAGNOSIS — E860 Dehydration: Secondary | ICD-10-CM | POA: Diagnosis present

## 2017-11-24 DIAGNOSIS — N1330 Unspecified hydronephrosis: Secondary | ICD-10-CM | POA: Insufficient documentation

## 2017-11-24 DIAGNOSIS — N5089 Other specified disorders of the male genital organs: Secondary | ICD-10-CM | POA: Diagnosis present

## 2017-11-24 DIAGNOSIS — K449 Diaphragmatic hernia without obstruction or gangrene: Secondary | ICD-10-CM | POA: Insufficient documentation

## 2017-11-24 DIAGNOSIS — D696 Thrombocytopenia, unspecified: Secondary | ICD-10-CM | POA: Diagnosis present

## 2017-11-24 DIAGNOSIS — M545 Low back pain: Secondary | ICD-10-CM | POA: Diagnosis present

## 2017-11-24 DIAGNOSIS — G2581 Restless legs syndrome: Secondary | ICD-10-CM | POA: Insufficient documentation

## 2017-11-24 DIAGNOSIS — Z981 Arthrodesis status: Secondary | ICD-10-CM

## 2017-11-24 DIAGNOSIS — I10 Essential (primary) hypertension: Secondary | ICD-10-CM | POA: Diagnosis present

## 2017-11-24 DIAGNOSIS — K624 Stenosis of anus and rectum: Secondary | ICD-10-CM | POA: Diagnosis present

## 2017-11-24 DIAGNOSIS — K581 Irritable bowel syndrome with constipation: Secondary | ICD-10-CM | POA: Diagnosis present

## 2017-11-24 DIAGNOSIS — C676 Malignant neoplasm of ureteric orifice: Secondary | ICD-10-CM | POA: Diagnosis present

## 2017-11-24 DIAGNOSIS — I129 Hypertensive chronic kidney disease with stage 1 through stage 4 chronic kidney disease, or unspecified chronic kidney disease: Secondary | ICD-10-CM | POA: Diagnosis present

## 2017-11-24 DIAGNOSIS — C785 Secondary malignant neoplasm of large intestine and rectum: Secondary | ICD-10-CM | POA: Diagnosis present

## 2017-11-24 DIAGNOSIS — K567 Ileus, unspecified: Secondary | ICD-10-CM | POA: Diagnosis not present

## 2017-11-24 DIAGNOSIS — E538 Deficiency of other specified B group vitamins: Secondary | ICD-10-CM | POA: Diagnosis present

## 2017-11-24 DIAGNOSIS — D638 Anemia in other chronic diseases classified elsewhere: Secondary | ICD-10-CM | POA: Diagnosis present

## 2017-11-24 DIAGNOSIS — N136 Pyonephrosis: Principal | ICD-10-CM | POA: Diagnosis present

## 2017-11-24 DIAGNOSIS — K560 Paralytic ileus: Secondary | ICD-10-CM | POA: Diagnosis present

## 2017-11-24 LAB — CBC
HCT: 29.8 % — ABNORMAL LOW (ref 39.0–52.0)
Hemoglobin: 10.1 g/dL — ABNORMAL LOW (ref 13.0–17.0)
MCH: 30.8 pg (ref 26.0–34.0)
MCHC: 33.9 g/dL (ref 30.0–36.0)
MCV: 90.9 fL (ref 78.0–100.0)
PLATELETS: 82 10*3/uL — AB (ref 150–400)
RBC: 3.28 MIL/uL — AB (ref 4.22–5.81)
RDW: 15.8 % — AB (ref 11.5–15.5)
WBC: 5.3 10*3/uL (ref 4.0–10.5)

## 2017-11-24 LAB — BASIC METABOLIC PANEL
ANION GAP: 6 (ref 5–15)
BUN: 19 mg/dL (ref 6–20)
CALCIUM: 9.4 mg/dL (ref 8.9–10.3)
CO2: 22 mmol/L (ref 22–32)
Chloride: 111 mmol/L (ref 101–111)
Creatinine, Ser: 1.57 mg/dL — ABNORMAL HIGH (ref 0.61–1.24)
GFR calc Af Amer: 46 mL/min — ABNORMAL LOW (ref >60)
GFR, EST NON AFRICAN AMERICAN: 40 mL/min — AB (ref >60)
GLUCOSE: 103 mg/dL — AB (ref 65–99)
POTASSIUM: 4.3 mmol/L (ref 3.5–5.1)
SODIUM: 139 mmol/L (ref 135–145)

## 2017-11-24 NOTE — Progress Notes (Signed)
11-24-17 BMP results routed to Dr. [name] for review.

## 2017-11-25 ENCOUNTER — Observation Stay (HOSPITAL_COMMUNITY)
Admission: RE | Admit: 2017-11-25 | Discharge: 2017-11-26 | Disposition: A | Discharge status: Home / Self Care | Payer: Medicare HMO | Source: Ambulatory Visit | Attending: Urology | Admitting: Urology

## 2017-11-25 ENCOUNTER — Encounter (HOSPITAL_COMMUNITY)
Admission: RE | Disposition: A | Discharge status: Home / Self Care | Payer: Self-pay | Source: Ambulatory Visit | Attending: Urology

## 2017-11-25 ENCOUNTER — Ambulatory Visit (HOSPITAL_COMMUNITY): Payer: Medicare HMO

## 2017-11-25 ENCOUNTER — Ambulatory Visit (HOSPITAL_COMMUNITY): Payer: Medicare HMO | Admitting: Anesthesiology

## 2017-11-25 ENCOUNTER — Encounter (HOSPITAL_COMMUNITY): Payer: Self-pay | Admitting: *Deleted

## 2017-11-25 ENCOUNTER — Other Ambulatory Visit: Payer: Self-pay

## 2017-11-25 DIAGNOSIS — N1330 Unspecified hydronephrosis: Principal | ICD-10-CM

## 2017-11-25 DIAGNOSIS — D494 Neoplasm of unspecified behavior of bladder: Secondary | ICD-10-CM | POA: Diagnosis not present

## 2017-11-25 DIAGNOSIS — K219 Gastro-esophageal reflux disease without esophagitis: Secondary | ICD-10-CM | POA: Diagnosis not present

## 2017-11-25 DIAGNOSIS — N135 Crossing vessel and stricture of ureter without hydronephrosis: Secondary | ICD-10-CM | POA: Diagnosis not present

## 2017-11-25 DIAGNOSIS — C785 Secondary malignant neoplasm of large intestine and rectum: Secondary | ICD-10-CM | POA: Diagnosis not present

## 2017-11-25 DIAGNOSIS — K560 Paralytic ileus: Secondary | ICD-10-CM | POA: Diagnosis not present

## 2017-11-25 DIAGNOSIS — I452 Bifascicular block: Secondary | ICD-10-CM | POA: Diagnosis not present

## 2017-11-25 DIAGNOSIS — C679 Malignant neoplasm of bladder, unspecified: Secondary | ICD-10-CM | POA: Diagnosis present

## 2017-11-25 DIAGNOSIS — G2581 Restless legs syndrome: Secondary | ICD-10-CM | POA: Diagnosis not present

## 2017-11-25 DIAGNOSIS — N136 Pyonephrosis: Secondary | ICD-10-CM | POA: Diagnosis not present

## 2017-11-25 DIAGNOSIS — C676 Malignant neoplasm of ureteric orifice: Secondary | ICD-10-CM | POA: Diagnosis not present

## 2017-11-25 DIAGNOSIS — N1339 Other hydronephrosis: Secondary | ICD-10-CM | POA: Diagnosis not present

## 2017-11-25 DIAGNOSIS — N320 Bladder-neck obstruction: Secondary | ICD-10-CM | POA: Diagnosis not present

## 2017-11-25 DIAGNOSIS — I255 Ischemic cardiomyopathy: Secondary | ICD-10-CM | POA: Diagnosis not present

## 2017-11-25 DIAGNOSIS — C675 Malignant neoplasm of bladder neck: Secondary | ICD-10-CM | POA: Diagnosis not present

## 2017-11-25 DIAGNOSIS — I25118 Atherosclerotic heart disease of native coronary artery with other forms of angina pectoris: Secondary | ICD-10-CM | POA: Diagnosis not present

## 2017-11-25 DIAGNOSIS — I10 Essential (primary) hypertension: Secondary | ICD-10-CM | POA: Diagnosis not present

## 2017-11-25 DIAGNOSIS — E785 Hyperlipidemia, unspecified: Secondary | ICD-10-CM | POA: Diagnosis not present

## 2017-11-25 HISTORY — PX: TRANSURETHRAL RESECTION OF BLADDER TUMOR: SHX2575

## 2017-11-25 HISTORY — PX: CYSTOSCOPY W/ RETROGRADES: SHX1426

## 2017-11-25 SURGERY — TURBT (TRANSURETHRAL RESECTION OF BLADDER TUMOR)
Anesthesia: General

## 2017-11-25 MED ORDER — SENNOSIDES-DOCUSATE SODIUM 8.6-50 MG PO TABS: 1.0000 | ORAL_TABLET | Freq: Every evening | ORAL | Status: DC | PRN

## 2017-11-25 MED ORDER — DEXAMETHASONE SODIUM PHOSPHATE 10 MG/ML IJ SOLN
INTRAMUSCULAR | Status: DC | PRN
  Administered 2017-11-25: 10 mg via INTRAVENOUS

## 2017-11-25 MED ORDER — LACTATED RINGERS IV SOLN
INTRAVENOUS | Status: DC
  Administered 2017-11-25: 11:00:00 via INTRAVENOUS

## 2017-11-25 MED ORDER — FENTANYL CITRATE (PF) 100 MCG/2ML IJ SOLN
INTRAMUSCULAR | Status: AC
  Filled 2017-11-25: qty 2

## 2017-11-25 MED ORDER — EPHEDRINE SULFATE 50 MG/ML IJ SOLN
INTRAMUSCULAR | Status: DC | PRN
  Administered 2017-11-25 (×2): 5 mg via INTRAVENOUS

## 2017-11-25 MED ORDER — CEFAZOLIN SODIUM-DEXTROSE 2-4 GM/100ML-% IV SOLN
2.0000 g | INTRAVENOUS | Status: AC
  Administered 2017-11-25: 2 g via INTRAVENOUS
  Filled 2017-11-25: qty 100

## 2017-11-25 MED ORDER — HYDROMORPHONE HCL 1 MG/ML IJ SOLN
0.5000 mg | INTRAMUSCULAR | Status: DC | PRN
  Administered 2017-11-25 – 2017-11-26 (×2): 1 mg via INTRAVENOUS
  Filled 2017-11-25 (×2): qty 1

## 2017-11-25 MED ORDER — SUGAMMADEX SODIUM 200 MG/2ML IV SOLN
INTRAVENOUS | Status: AC
  Filled 2017-11-25: qty 2

## 2017-11-25 MED ORDER — NITROGLYCERIN 0.4 MG SL SUBL: 0.4000 mg | SUBLINGUAL_TABLET | SUBLINGUAL | Status: DC | PRN

## 2017-11-25 MED ORDER — BISACODYL 10 MG RE SUPP: 10.0000 mg | Freq: Every day | RECTAL | Status: DC | PRN

## 2017-11-25 MED ORDER — PROPOFOL 10 MG/ML IV BOLUS
INTRAVENOUS | Status: DC | PRN
  Administered 2017-11-25: 180 mg via INTRAVENOUS

## 2017-11-25 MED ORDER — FENTANYL CITRATE (PF) 100 MCG/2ML IJ SOLN
INTRAMUSCULAR | Status: DC | PRN
  Administered 2017-11-25 (×2): 25 ug via INTRAVENOUS
  Administered 2017-11-25: 50 ug via INTRAVENOUS

## 2017-11-25 MED ORDER — FENTANYL CITRATE (PF) 100 MCG/2ML IJ SOLN
INTRAMUSCULAR | Status: AC
  Administered 2017-11-25: 50 ug via INTRAVENOUS
  Filled 2017-11-25: qty 2

## 2017-11-25 MED ORDER — ONDANSETRON HCL 4 MG/2ML IJ SOLN
INTRAMUSCULAR | Status: DC | PRN
  Administered 2017-11-25: 4 mg via INTRAVENOUS

## 2017-11-25 MED ORDER — CARVEDILOL 6.25 MG PO TABS
6.2500 mg | ORAL_TABLET | ORAL | Status: AC
  Administered 2017-11-25: 6.25 mg via ORAL
  Filled 2017-11-25: qty 1

## 2017-11-25 MED ORDER — ISOSORBIDE MONONITRATE ER 60 MG PO TB24
60.0000 mg | ORAL_TABLET | Freq: Every day | ORAL | Status: DC
  Administered 2017-11-25 – 2017-11-26 (×2): 60 mg via ORAL
  Filled 2017-11-25 (×2): qty 1

## 2017-11-25 MED ORDER — DIPHENHYDRAMINE HCL 12.5 MG/5ML PO ELIX: 12.5000 mg | ORAL_SOLUTION | Freq: Four times a day (QID) | ORAL | Status: DC | PRN

## 2017-11-25 MED ORDER — FLEET ENEMA 7-19 GM/118ML RE ENEM: 1.0000 | ENEMA | Freq: Once | RECTAL | Status: DC | PRN

## 2017-11-25 MED ORDER — OXYCODONE HCL 5 MG PO TABS: 5.0000 mg | ORAL_TABLET | Freq: Once | ORAL | Status: DC | PRN

## 2017-11-25 MED ORDER — ONDANSETRON HCL 4 MG/2ML IJ SOLN: 4.0000 mg | INTRAMUSCULAR | Status: DC | PRN

## 2017-11-25 MED ORDER — LUBIPROSTONE 24 MCG PO CAPS
24.0000 ug | ORAL_CAPSULE | Freq: Two times a day (BID) | ORAL | Status: DC
  Administered 2017-11-25 – 2017-11-26 (×2): 24 ug via ORAL
  Filled 2017-11-25 (×3): qty 1

## 2017-11-25 MED ORDER — CARVEDILOL 6.25 MG PO TABS
6.2500 mg | ORAL_TABLET | Freq: Two times a day (BID) | ORAL | Status: DC
  Administered 2017-11-25 – 2017-11-26 (×2): 6.25 mg via ORAL
  Filled 2017-11-25 (×2): qty 1

## 2017-11-25 MED ORDER — TETRAHYDROZ-GLYC-HYPROM-PEG 0.05-0.2-0.36-1 % OP SOLN: Freq: Two times a day (BID) | OPHTHALMIC | Status: DC | PRN

## 2017-11-25 MED ORDER — PROMETHAZINE HCL 25 MG/ML IJ SOLN: 6.2500 mg | INTRAMUSCULAR | Status: DC | PRN

## 2017-11-25 MED ORDER — SODIUM CHLORIDE 0.9 % IR SOLN
Status: DC | PRN
  Administered 2017-11-25: 6000 mL via INTRAVESICAL

## 2017-11-25 MED ORDER — DIPHENHYDRAMINE HCL 50 MG/ML IJ SOLN: 12.5000 mg | Freq: Four times a day (QID) | INTRAMUSCULAR | Status: DC | PRN

## 2017-11-25 MED ORDER — NAPHAZOLINE-GLYCERIN 0.012-0.2 % OP SOLN: 1.0000 [drp] | Freq: Four times a day (QID) | OPHTHALMIC | Status: DC | PRN

## 2017-11-25 MED ORDER — OXYCODONE HCL 5 MG/5ML PO SOLN
5.0000 mg | Freq: Once | ORAL | Status: DC | PRN
  Filled 2017-11-25: qty 5

## 2017-11-25 MED ORDER — ONDANSETRON HCL 4 MG/2ML IJ SOLN
INTRAMUSCULAR | Status: AC
  Filled 2017-11-25: qty 2

## 2017-11-25 MED ORDER — ACETAMINOPHEN 325 MG PO TABS: 650.0000 mg | ORAL_TABLET | ORAL | Status: DC | PRN

## 2017-11-25 MED ORDER — HYOSCYAMINE SULFATE 0.125 MG SL SUBL
0.1250 mg | SUBLINGUAL_TABLET | SUBLINGUAL | Status: DC | PRN
  Filled 2017-11-25: qty 1

## 2017-11-25 MED ORDER — FAMOTIDINE 20 MG PO TABS
20.0000 mg | ORAL_TABLET | Freq: Two times a day (BID) | ORAL | Status: DC
  Administered 2017-11-25 – 2017-11-26 (×2): 20 mg via ORAL
  Filled 2017-11-25 (×2): qty 1

## 2017-11-25 MED ORDER — LIDOCAINE HCL (CARDIAC) 20 MG/ML IV SOLN
INTRAVENOUS | Status: DC | PRN
  Administered 2017-11-25: 60 mg via INTRAVENOUS

## 2017-11-25 MED ORDER — EPHEDRINE 5 MG/ML INJ
INTRAVENOUS | Status: AC
  Filled 2017-11-25: qty 10

## 2017-11-25 MED ORDER — POTASSIUM CHLORIDE IN NACL 20-0.45 MEQ/L-% IV SOLN
INTRAVENOUS | Status: DC
  Administered 2017-11-25 – 2017-11-26 (×2): via INTRAVENOUS
  Filled 2017-11-25 (×2): qty 1000

## 2017-11-25 MED ORDER — PROPOFOL 10 MG/ML IV BOLUS
INTRAVENOUS | Status: AC
  Filled 2017-11-25: qty 20

## 2017-11-25 MED ORDER — PRAMIPEXOLE DIHYDROCHLORIDE 1 MG PO TABS
1.5000 mg | ORAL_TABLET | Freq: Every day | ORAL | Status: DC
  Administered 2017-11-25: 1.5 mg via ORAL
  Filled 2017-11-25: qty 2

## 2017-11-25 MED ORDER — IOHEXOL 300 MG/ML  SOLN
INTRAMUSCULAR | Status: DC | PRN
  Administered 2017-11-25: 13 mL via URETHRAL

## 2017-11-25 MED ORDER — FENTANYL CITRATE (PF) 100 MCG/2ML IJ SOLN
25.0000 ug | INTRAMUSCULAR | Status: DC | PRN
  Administered 2017-11-25 (×2): 50 ug via INTRAVENOUS

## 2017-11-25 MED ORDER — TRAMADOL HCL 50 MG PO TABS: 50.0000 mg | ORAL_TABLET | Freq: Four times a day (QID) | ORAL | Status: DC | PRN

## 2017-11-25 SURGICAL SUPPLY — 22 items
BAG URINE DRAINAGE (UROLOGICAL SUPPLIES) ×4 IMPLANT
BAG URO CATCHER STRL LF (MISCELLANEOUS) ×4 IMPLANT
CATH FOLEY 3WAY 30CC 22FR (CATHETERS) ×4 IMPLANT
CATH URET 5FR 28IN OPEN ENDED (CATHETERS) ×4 IMPLANT
CLOTH BEACON ORANGE TIMEOUT ST (SAFETY) ×4 IMPLANT
COVER FOOTSWITCH UNIV (MISCELLANEOUS) ×4 IMPLANT
COVER SURGICAL LIGHT HANDLE (MISCELLANEOUS) IMPLANT
ELECT REM PT RETURN 15FT ADLT (MISCELLANEOUS) IMPLANT
GLOVE SURG SS PI 8.0 STRL IVOR (GLOVE) ×4 IMPLANT
GOWN STRL REUS W/TWL XL LVL3 (GOWN DISPOSABLE) ×4 IMPLANT
GUIDEWIRE ANG ZIPWIRE 038X150 (WIRE) ×4 IMPLANT
GUIDEWIRE STR DUAL SENSOR (WIRE) ×4 IMPLANT
HOLDER FOLEY CATH W/STRAP (MISCELLANEOUS) ×4 IMPLANT
LOOP CUT BIPOLAR 24F LRG (ELECTROSURGICAL) ×4 IMPLANT
MANIFOLD NEPTUNE II (INSTRUMENTS) ×4 IMPLANT
PACK CYSTO (CUSTOM PROCEDURE TRAY) ×4 IMPLANT
SET ASPIRATION TUBING (TUBING) IMPLANT
SUT ETHILON 3 0 PS 1 (SUTURE) IMPLANT
SYR 30ML LL (SYRINGE) ×4 IMPLANT
SYRINGE IRR TOOMEY STRL 70CC (SYRINGE) IMPLANT
TUBING CONNECTING 10 (TUBING) ×4 IMPLANT
WATER STERILE IRR 500ML POUR (IV SOLUTION) ×4 IMPLANT

## 2017-11-25 NOTE — H&P (Signed)
CC: I have hydronephrosis.  HPI: Charles Coffey is a 82 year-old male established patient who is here for hydronephrosis.    Charles Coffey returns today in f/u. His CT showed a probable left bladder base mass with possible ureteral involvement and marked left hydro with possible studing of Gerota's fascia all worrisome for a urothelial carcinoma. there is mild right hydro on the delayed views and he has a full bladder on CT and a history of BOO with an elevated PVR in the past. He also has a history of a stable elevated PSA and a prior CTT for treatment of BPH with BOO. He has a reduced stream and some nocturia but no hematuria. UA remains clear today.      ALLERGIES: Antihistamine Decongestant TABS Statins     MEDICATIONS: Aspirin 81 MG TABS Oral  Carvedilol 6.25 mg tablet Oral  Daily Multiple Vitamins TABS Oral  Isosorbide Mononitrate Er 60 mg tablet, extended release 24 hr  Linzess 290 mcg capsule Oral  Ranitidine Hcl  Tramadol Hcl 50 mg tablet     GU PSH: Locm 300-399Mg /Ml Iodine,1Ml - 11/11/2017      PSH Notes: Heart Surgery, Back Surgery   He has had lumbar epidurals   NON-GU PSH: None   GU PMH: Hydronephrosis, Left - 11/03/2017 BPH w/LUTS (Stable), He is voiding with a stable PVR, but he has increased frequency and urgency on augmentin. I will just have him return in a year with a PVR. - 02/03/2017, Benign prostatic hypertrophy (BPH) with incomplete bladder emptying, - 04/14/2016 Urinary Frequency (Worsening) - 02/03/2017 Elevated PSA, Elevated prostate specific antigen (PSA) - April 14, 2016 Urinary Retention, Unspec, Incomplete bladder emptying - 2015-04-15 Weak Urinary Stream, Weak urinary stream - Apr 15, 2015 ED due to arterial insufficiency, Erectile dysfunction due to arterial insufficiency - 04-14-2013 Personal Hx Oth male genital organs diseases, History of balanitis - 04-14-13      PMH Notes:  2010-11-27 14:42:45 - Note: Acute Myocardial Infarction  2010-11-27 14:42:45 - Note: Arthritis   NON-GU  PMH: Encounter for general adult medical examination without abnormal findings, Encounter for preventive health examination - 2016/04/14 Personal history of other diseases of the digestive system, History of diverticulitis of colon - 04/14/2014, History of esophageal reflux, - 04/14/2013 Irritable bowel syndrome with diarrhea, Irritable Bowel Syndrome - Apr 14, 2013 Personal history of diseases of the skin and subcutaneous tissue, History of rosacea - 04-14-2013 Personal history of other diseases of the circulatory system, History of hypertension - 14-Apr-2013, History of cardiac disorder, - 2013/04/14 Personal history of other endocrine, nutritional and metabolic disease, History of hypercholesterolemia - April 14, 2013 Restless legs syndrome, Restless Legs Syndrome - April 14, 2013    FAMILY HISTORY: Death In The Family Mother - Runs In Family Family Health Status Number - Runs In Family   SOCIAL HISTORY: None    Notes: Former smoker, Occupation:, Marital History - Currently Married, Caffeine Use, Alcohol Use   REVIEW OF SYSTEMS:    GU Review Male:   Patient reports get up at night to urinate, stream starts and stops, and trouble starting your stream. Patient denies frequent urination, hard to postpone urination, burning/ pain with urination, leakage of urine, have to strain to urinate , erection problems, and penile pain.  Gastrointestinal (Upper):   Patient denies nausea, vomiting, and indigestion/ heartburn.  Gastrointestinal (Lower):   Patient reports constipation. Patient denies diarrhea.  Constitutional:   Patient reports weight loss and fatigue. Patient denies fever and night sweats.  Skin:   Patient denies skin rash/ lesion and itching.  Eyes:   Patient denies blurred vision and double vision.  Ears/ Nose/ Throat:   Patient denies sore throat and sinus problems.  Hematologic/Lymphatic:   Patient reports easy bruising. Patient denies swollen glands.  Cardiovascular:   Patient denies leg swelling and chest pains.  Respiratory:   Patient denies  cough and shortness of breath.  Endocrine:   Patient denies excessive thirst.  Musculoskeletal:   Patient reports back pain. Patient denies joint pain.  Neurological:   Patient denies headaches and dizziness.  Psychologic:   Patient denies depression and anxiety.   VITAL SIGNS:      11/15/2017 01:38 PM  BP 116/61 mmHg  Heart Rate 55 /min  Temperature 97.1 F / 36.1 C   GU PHYSICAL EXAMINATION:    Anus and Perineum: No hemorrhoids. No anal stenosis. No rectal fissure, no anal fissure. No edema, no dimple, no perineal tenderness, no anal tenderness.  Scrotum: No lesions. No edema. No cysts. No warts.  Epididymides: Right: no spermatocele, no masses, no cysts, no tenderness, no induration, no enlargement. Left: no spermatocele, no masses, no cysts, no tenderness, no induration, no enlargement.  Testes: < 3 cm hydrocele left testis. No tenderness, no swelling, no enlargement left testis. No tenderness, no swelling, no enlargement right testis. Normal location left testis. Normal location right testis. No mass, no cyst, no varicocele left testis. No mass, no cyst, no varicocele, no hydrocele right testis.   Urethral Meatus: Normal size. No lesion, no wart, no discharge, no polyp. Normal location.  Penis: Penis uncircumcised. No foreskin warts, no cracks. No dorsal peyronie's plaques, no left corporal peyronie's plaques, no right corporal peyronie's plaques, no scarring, no shaft warts. No balanitis, no meatal stenosis.   Prostate: hard and indistinct.   Seminal Vesicles: Nonpalpable.  Sphincter Tone: Normal sphincter. No rectal tenderness. No rectal mass.    MULTI-SYSTEM PHYSICAL EXAMINATION:    Constitutional: Well-nourished. No physical deformities. Normally developed. Good grooming.  Neck: Neck symmetrical, not swollen. Normal tracheal position.  Respiratory: No labored breathing, no use of accessory muscles. CTA  Cardiovascular: Normal temperature, RRR without murmur  Lymphatic: No  enlargement, no tenderness of supraclavicuar, groin, neck lymph nodes.  Skin: No paleness, no jaundice, no cyanosis. No lesion, no ulcer, no rash.  Neurologic / Psychiatric: Oriented to time, oriented to place, oriented to person. No depression, no anxiety, no agitation.  Gastrointestinal: No hernia. No mass, no tenderness, no rigidity, non obese abdomen.   Musculoskeletal: Normal gait and station of head and neck.     PAST DATA REVIEWED:  Source Of History:  Patient  Lab Test Review:   PSA, BUN/Creatinine  Urine Test Review:   Urinalysis  X-Ray Review: C.T. Hematuria: Reviewed Films. Reviewed Report. Discussed With Patient.     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 There is also mild right hydro on delays. He needs cystoscopy with bilateral RTG's and possible stent with TURBT. I have reviewed the risks of the procedure including the inability to place a stent requiring subsequent PCNL, bladder wall injury with need for secondary procedures or prolonged catheter drainage, stent irritation, pain, hematuria and infection.   2   Hydronephrosis - N13.0 Left  3   Elevated PSA - R97.20 He has a history of an elevated PSA and his prostate is now hard and indistinct. I will get a PSA today and will consider TURP as needed because of his elevated  PVR and voiding difficulty. I reviewd the risks of a TURP including bleeding, infection, incontinence, stricture, need for secondary procedures, ejaculatory and erectile dysfunction, thrombotic events, fluid overload and anesthetic complications. I explained that 95% of men will have relief of the obstructive symptoms and about 70% will have relief of the irritative symptoms.   4   BPH w/LUTS - N40.1   5   Incomplete bladder emptying - R39.14    PLAN:           Orders Labs PSA, PSA with Reflex, Urine Cytology          Schedule Return Visit/Planned Activity: ASAP - Schedule Surgery

## 2017-11-25 NOTE — Anesthesia Procedure Notes (Signed)
Procedure Name: LMA Insertion Date/Time: 11/25/2017 12:50 PM Performed by: Glory Buff, CRNA Pre-anesthesia Checklist: Patient identified, Emergency Drugs available, Suction available and Patient being monitored Patient Re-evaluated:Patient Re-evaluated prior to induction Oxygen Delivery Method: Circle system utilized Preoxygenation: Pre-oxygenation with 100% oxygen Induction Type: IV induction LMA: LMA inserted LMA Size: 4.0 Number of attempts: 1 Placement Confirmation: positive ETCO2 Tube secured with: Tape Dental Injury: Teeth and Oropharynx as per pre-operative assessment

## 2017-11-25 NOTE — Anesthesia Postprocedure Evaluation (Signed)
Anesthesia Post Note  Patient: Charles Coffey  Procedure(s) Performed: TRANSURETHRAL RESECTION OF BLADDER TUMOR (TURBT) (N/A ) CYSTOSCOPY WITH RETROGRADE PYELOGRAM (Left )     Patient location during evaluation: PACU Anesthesia Type: General Level of consciousness: awake and alert Pain management: pain level controlled Vital Signs Assessment: post-procedure vital signs reviewed and stable Respiratory status: spontaneous breathing, nonlabored ventilation, respiratory function stable and patient connected to nasal cannula oxygen Cardiovascular status: blood pressure returned to baseline and stable Postop Assessment: no apparent nausea or vomiting Anesthetic complications: no    Last Vitals:  Vitals:   11/25/17 1430 11/25/17 1445  BP: (!) 145/75 (!) 142/87  Pulse: (!) 58 (!) 55  Resp: 12 12  Temp:  (!) 36.4 C  SpO2: 97% 100%    Last Pain:  Vitals:   11/25/17 1445  TempSrc:   PainSc: 3                  Sonna Lipsky S

## 2017-11-25 NOTE — Op Note (Signed)
Procedure:. 1.  Cystoscopy with left retrograde pyelogram and interpretation. 2.  Transurethral resection of 4 cm bladder neck mass.  Preop diagnosis: Severe left and mild right hydronephrosis with mass at bladder base.  Postoperative diagnosis: same.  Surgeon: Dr. Irine Seal.  Anesthesia: General.  Drain: 89 French three-way Foley catheter.  Specimen: Resection chips from trigone and bladder neck.  EBL: Minimal.  Complications: None.  Indications: Charles Coffey is an 82 year old white male with a prior history of TURP and elevated PSA who was sent back in consultation for the recent finding of left hydronephrosis on a CT scan.  He was also noted to have a mass-effect at the bladder base and studing of Gerota's fascia worrisome for neoplasm.  On my review of the CT there was also mild right hydronephrosis.  His PSA preoperatively was elevated at 6.76 from the previous level of around 5.  A cytology had atypical cells.  It was felt that cystoscopy with bilateral retrograde pyelography and possible stent placement along with resection/biopsy of the mass was indicated.  Procedure: Charles Coffey was taken to the operating room where he was given antibiotics and a general anesthetic was induced.  He was placed in the lithotomy position and fitted with PAS hose.  His perineum and genitalia were prepped with Betadine solution and he was draped in the usual sterile fashion.  Cystoscopy was performed using the 23 [place] scope and 30 and 70 degree lenses.  Inspection revealed a normal urethra.  The external sphincter was intact.  The prostatic urethra was somewhat fixed but widely patent with prior resection.  There was some elevation of the bladder neck.  Inspection of the bladder demonstrated severe trabeculation with findings suggestive of submucosal infiltration by a neoplastic process at the bladder neck. no mucosal lesions were identified.  The left ureteral orifice was eventually identified using  the 70 degree lens.  I was able to cannulate the orifice with the 5 [place] open-ended catheter with the aid of a sensor guidewire.  The wire was removed and contrast was instilled.  The left retrograde pyelogram demonstrated narrowing of the left ureter throughout its length with a ratty appearance.  There was minimal flow of contrast into a markedly dilated renal collecting system.  The guidewire was then reinserted through the opening catheter and the opening catheter was advanced into the proximal ureter.  There was a small cavity or dilated area below the UPJ but only a very fine lumen into the renal pelvis was noted.  Multiple attempts to pass a Glidewire to the kidney were unsuccessful due to coiling within the cavity.  Despite a vigorous attempt I was never able to identify the right ureteral orifice.  The cystoscope was removed and replaced with a 26 French continuous-flow resectoscope sheath.  The resectoscope was fitted with an Beatrix Fetters handle a 30 degree lens and a bipolar loop.  Saline was used as the irrigant.  I resected the mid trigone and bladder neck for a diameter of approximately 4 cm.  The tissue had a fish flesh consistency.  Hemostasis was achieved and the bladder was evacuated free of chips.  Final inspection revealed no active bleeding and a widely quite patent prostatic urethra.  The resectoscope was removed and a 16 [place] three-way Foley catheter was inserted with the aid of a catheter guide.  The balloon was filled with 30 cc of sterile water and the catheter was irrigated with clear return.  The catheter was then connected to continuous irrigation and  straight drainage.  Charles Coffey was taken down from the lithotomy position.  His anesthetic was reversed and he was moved to the recovery room in stable condition.  There were no complications.

## 2017-11-25 NOTE — Transfer of Care (Signed)
Immediate Anesthesia Transfer of Care Note  Patient: Charles Coffey  Procedure(s) Performed: TRANSURETHRAL RESECTION OF BLADDER TUMOR (TURBT) (N/A ) CYSTOSCOPY WITH RETROGRADE PYELOGRAM (Left )  Patient Location: PACU  Anesthesia Type:General  Level of Consciousness: awake, alert  and oriented  Airway & Oxygen Therapy: Patient Spontanous Breathing and Patient connected to face mask oxygen  Post-op Assessment: Report given to RN and Post -op Vital signs reviewed and stable  Post vital signs: Reviewed and stable  Last Vitals:  Vitals:   11/25/17 1016  BP: 137/77  Pulse: 68  Resp: 20  Temp: 36.5 C  SpO2: 97%    Last Pain:  Vitals:   11/25/17 1016  TempSrc: Oral         Complications: No apparent anesthesia complications

## 2017-11-25 NOTE — Progress Notes (Signed)
Patient ID: Charles Coffey, male   DOB: [birth year], 82 y.o.   MRN: [id number] He is doing well post op with minimal discomfort.   The urine is light pink.  .BP 131/75 (BP Location: Right Arm)   Pulse 65   Temp (!) 97.5 F (36.4 C)   Resp 13   Ht 5' 8.5" (1.74 m)   Wt 88 kg (194 lb)   SpO2 100%   BMI 29.07 kg/m   I discussed the surgical findings with him.     I plan to remove the foley in the morning and discharge him if he is voiding well.  I was unable to stent the left ureter and couldn't find the right orifice.   I would like to avoid a perc if possible until we know his path.

## 2017-11-25 NOTE — Anesthesia Preprocedure Evaluation (Addendum)
Anesthesia Evaluation  Patient identified by MRN, date of birth, ID band Patient awake    Reviewed: Allergy & Precautions, NPO status , Patient's Chart, lab work & pertinent test results  Airway Mallampati: II  TM Distance: >3 FB Neck ROM: Full    Dental no notable dental hx.    Pulmonary neg pulmonary ROS, former smoker,    Pulmonary exam normal breath sounds clear to auscultation       Cardiovascular hypertension, + CAD, + Past MI and + CABG  Normal cardiovascular exam Rhythm:Regular Rate:Normal     Neuro/Psych negative neurological ROS  negative psych ROS   GI/Hepatic negative GI ROS, Neg liver ROS,   Endo/Other  negative endocrine ROS  Renal/GU negative Renal ROS  negative genitourinary   Musculoskeletal negative musculoskeletal ROS (+)   Abdominal   Peds negative pediatric ROS (+)  Hematology  (+) anemia ,   Anesthesia Other Findings   Reproductive/Obstetrics negative OB ROS                             Anesthesia Physical Anesthesia Plan  ASA: III  Anesthesia Plan: General   Post-op Pain Management:    Induction: Intravenous  PONV Risk Score and Plan: 2 and Ondansetron, Dexamethasone and Treatment may vary due to age or medical condition  Airway Management Planned: LMA  Additional Equipment:   Intra-op Plan:   Post-operative Plan: Extubation in OR  Informed Consent: I have reviewed the patients History and Physical, chart, labs and discussed the procedure including the risks, benefits and alternatives for the proposed anesthesia with the patient or authorized representative who has indicated his/her understanding and acceptance.   Dental advisory given  Plan Discussed with: CRNA and Surgeon  Anesthesia Plan Comments:         Anesthesia Quick Evaluation

## 2017-11-26 ENCOUNTER — Encounter (HOSPITAL_COMMUNITY): Payer: Self-pay | Admitting: Urology

## 2017-11-26 ENCOUNTER — Telehealth: Payer: Self-pay | Admitting: Internal Medicine

## 2017-11-26 LAB — BASIC METABOLIC PANEL
Anion gap: 7 (ref 5–15)
BUN: 19 mg/dL (ref 6–20)
CHLORIDE: 108 mmol/L (ref 101–111)
CO2: 22 mmol/L (ref 22–32)
CREATININE: 1.58 mg/dL — AB (ref 0.61–1.24)
Calcium: 9.3 mg/dL (ref 8.9–10.3)
GFR calc non Af Amer: 40 mL/min — ABNORMAL LOW (ref >60)
GFR, EST AFRICAN AMERICAN: 46 mL/min — AB (ref >60)
GLUCOSE: 157 mg/dL — AB (ref 65–99)
Potassium: 5.3 mmol/L — ABNORMAL HIGH (ref 3.5–5.1)
Sodium: 137 mmol/L (ref 135–145)

## 2017-11-26 NOTE — Progress Notes (Signed)
Has voided X1 Dr Roni Bread wants string of bottles  All d/c instructions given w/ verbal understanding.Encouraged PO's

## 2017-11-26 NOTE — Telephone Encounter (Signed)
Pt states the amitiza is not helping. Reports he has not had a BM in 2 weeks. Pt instructed to try a miralax purge with 7 capfuls of miralax with 32 oz of gatorade and to drink a cup every 63min until gone. Pt verbalized understanding.

## 2017-11-26 NOTE — Discharge Summary (Signed)
Physician Discharge Summary  Patient ID: Charles Coffey MRN: [id number] DOB/AGE: [birth year] 82 y.o.  Admit date: 11/25/2017 Discharge date: 11/26/2017  Admission Diagnoses:  Hydronephrosis due to obstructive malignant neoplasm of bladder [facility])  Discharge Diagnoses:  Principal Problem:   Hydronephrosis due to obstructive malignant neoplasm of bladder [facility])   Past Medical History:  Diagnosis Date  . Aortic atherosclerosis ([place])   . BPH with urinary obstruction   . CAD (coronary artery disease)    a.  MI 1995, CABG x 3 2002 (patient says that he had LIMA and RIMA grafts). b. ETT-Cardiolite (10/15) with EF 48%, apical scar, no ischemia. c. Nuc 07/2017 abnormal -> cath was performed,  patent LIMA to LAD and SVG to OM 2. RIMA to RCA is atretic. The right coronary artery is occluded distally with extensive collaterals from the LAD, medical therapy.   . Cervical spondylosis without myelopathy 04-05-202017  . Chronic low back pain    sees Dr. Kary Kos   . GERD (gastroesophageal reflux disease)   . Hiatal hernia   . Hyperlipidemia   . Hypertension   . IBS (irritable bowel syndrome)   . Ischemic cardiomyopathy   . Myocardial infarction ([place])   . RBBB   . Restless legs syndrome   . Statin intolerance   . Syncope    a. in 2015 - no apparent cause, was taking sleep medicine at the time. Cardiac workup unremarkable.  . Tubular adenoma of colon     Surgeries: Procedure(s): TRANSURETHRAL RESECTION OF BLADDER TUMOR (TURBT) CYSTOSCOPY WITH RETROGRADE PYELOGRAM on 11/25/2017   Consultants (if any):   Discharged Condition: Improved  Hospital Course: Charles Coffey is an 82 y.o. male who was admitted 11/25/2017 with a diagnosis of Hydronephrosis due to obstructive malignant neoplasm of bladder ([place]) and went to the operating room on 11/25/2017 and underwent the above named procedures.  He had a submucosal mass of the bladder neck and trigone and I was unable to identify the right UO.   The  left ureter was strictured throughout its length and I was unable to pass a stent.   His Cr is 1.58 this morning with a potassium of 5.3.   I will check that next week.   He has no pain but has constipation and Miralax was recommended.   He was given perioperative antibiotics:  Anti-infectives (From admission, onward)   Start     Dose/Rate Route Frequency Ordered Stop   11/25/17 1002  ceFAZolin (ANCEF) IVPB 2g/100 mL premix     2 g 200 mL/hr over 30 Minutes Intravenous 30 min pre-op 11/25/17 1002 11/25/17 1321    .  He was given sequential compression devices, for DVT prophylaxis.  He benefited maximally from the hospital stay and there were no complications.    Recent vital signs:  Vitals:   11/25/17 2253 11/26/17 0455  BP: (!) 114/56 (!) 144/55  Pulse: 61 62  Resp: 16 16  Temp: 98.7 F (37.1 C) 98.7 F (37.1 C)  SpO2: 99% 97%    Recent laboratory studies:  Lab Results  Component Value Date   HGB 10.1 (L) 11/24/2017   HGB 10.0 (L) 09/15/2017   HGB 10.1 (L) 08/31/2017   Lab Results  Component Value Date   WBC 5.3 11/24/2017   PLT 82 (L) 11/24/2017   Lab Results  Component Value Date   INR 1.0 08/24/2017   Lab Results  Component Value Date   NA 137 11/26/2017   K 5.3 (H) 11/26/2017  CL 108 11/26/2017   CO2 22 11/26/2017   BUN 19 11/26/2017   CREATININE 1.58 (H) 11/26/2017   GLUCOSE 157 (H) 11/26/2017    Discharge Medications:   Allergies as of 11/26/2017      Reactions   Antihistamines, Loratadine-type Other (See Comments)   Unable to urinate   Statins Other (See Comments)   liver effects      Medication List    TAKE these medications   acetaminophen 500 MG tablet Commonly known as:  TYLENOL Take 1,000 mg by mouth every 8 (eight) hours as needed for mild pain or moderate pain.   aspirin 81 MG tablet Take 81 mg by mouth daily.   carvedilol 6.25 MG tablet Commonly known as:  COREG Take 1 tablet (6.25 mg total) 2 (two) times daily with a meal by  mouth.   CHOLESTOFF PO Take 2 tablets by mouth 2 (two) times daily.   isosorbide mononitrate 60 MG 24 hr tablet Commonly known as:  IMDUR Take 1 tablet (60 mg total) by mouth daily.   linaclotide 290 MCG Caps capsule Commonly known as:  LINZESS TAKE 1 CAPSULE (290 MCG TOTAL) BY MOUTH DAILY. 30 MINUTES PRIOR TO A MEAL   lubiprostone 24 MCG capsule Commonly known as:  AMITIZA Take 1 capsule (24 mcg total) by mouth 2 (two) times daily with a meal.   nitroGLYCERIN 0.4 MG SL tablet Commonly known as:  NITROSTAT Place 1 tablet (0.4 mg total) under the tongue every 5 (five) minutes as needed.   pramipexole 1 MG tablet Commonly known as:  MIRAPEX TAKE 1 AND 1/2 TABLETS BY MOUTH AT BEDTIME   ranitidine 300 MG tablet Commonly known as:  ZANTAC TAKE 1 TABLET BY MOUTH TWICE A DAY   traMADol 50 MG tablet Commonly known as:  ULTRAM Take 50 mg by mouth every 6 (six) hours as needed for moderate pain.   TYLENOL PM EXTRA STRENGTH 25-500 MG Tabs tablet Generic drug:  diphenhydramine-acetaminophen Take 1 tablet by mouth at bedtime as needed.   VISINE MAXIMUM REDNESS RELIEF OP Place 2 drops into both eyes 2 (two) times daily as needed (dry eyes).       Diagnostic Studies: Dg C-arm 1-60 Min-no Report  Result Date: 11/25/2017 Fluoroscopy was utilized by the requesting physician.  No radiographic interpretation.    Disposition: 01-Home or Self Care  Discharge Instructions    Discontinue IV   Complete by:  As directed       Follow-up Information    Irine Seal, MD Follow up.   Specialty:  Urology Why:  Keep your scheduled appt but I will call with the pathology when available.  Contact information: [place] [id number] [phone]          I will call him with the pathology results when available.   I will get him set up for a repeat BMP and renal US next week.   Signed: Irine Seal 11/26/2017, 7:32 AM

## 2017-11-26 NOTE — Progress Notes (Signed)
Voided x3 clear pink/red urine voices feels OK and feels that bladder is relieved after voiding and ready to D/C.D/c to home w/ wifw via w/c voices no c/o.

## 2017-11-28 ENCOUNTER — Inpatient Hospital Stay (HOSPITAL_COMMUNITY): Payer: Medicare HMO

## 2017-11-28 ENCOUNTER — Emergency Department (HOSPITAL_COMMUNITY): Payer: Medicare HMO

## 2017-11-28 ENCOUNTER — Encounter (HOSPITAL_COMMUNITY): Payer: Self-pay | Admitting: Emergency Medicine

## 2017-11-28 ENCOUNTER — Inpatient Hospital Stay (HOSPITAL_COMMUNITY)
Admission: EM | Admit: 2017-11-28 | Discharge: 2017-12-04 | DRG: 669 | Disposition: A | Discharge status: Home / Self Care | Payer: Medicare HMO | Attending: Family Medicine | Admitting: Family Medicine

## 2017-11-28 ENCOUNTER — Telehealth: Payer: Self-pay | Admitting: Gastroenterology

## 2017-11-28 DIAGNOSIS — C679 Malignant neoplasm of bladder, unspecified: Secondary | ICD-10-CM | POA: Diagnosis not present

## 2017-11-28 DIAGNOSIS — R194 Change in bowel habit: Secondary | ICD-10-CM | POA: Diagnosis not present

## 2017-11-28 DIAGNOSIS — C676 Malignant neoplasm of ureteric orifice: Secondary | ICD-10-CM | POA: Diagnosis not present

## 2017-11-28 DIAGNOSIS — E860 Dehydration: Secondary | ICD-10-CM | POA: Diagnosis not present

## 2017-11-28 DIAGNOSIS — R52 Pain, unspecified: Secondary | ICD-10-CM

## 2017-11-28 DIAGNOSIS — N183 Chronic kidney disease, stage 3 unspecified: Secondary | ICD-10-CM | POA: Diagnosis present

## 2017-11-28 DIAGNOSIS — D649 Anemia, unspecified: Secondary | ICD-10-CM | POA: Diagnosis not present

## 2017-11-28 DIAGNOSIS — N12 Tubulo-interstitial nephritis, not specified as acute or chronic: Secondary | ICD-10-CM | POA: Diagnosis not present

## 2017-11-28 DIAGNOSIS — R140 Abdominal distension (gaseous): Secondary | ICD-10-CM | POA: Diagnosis not present

## 2017-11-28 DIAGNOSIS — C20 Malignant neoplasm of rectum: Secondary | ICD-10-CM | POA: Diagnosis not present

## 2017-11-28 DIAGNOSIS — I10 Essential (primary) hypertension: Secondary | ICD-10-CM | POA: Diagnosis present

## 2017-11-28 DIAGNOSIS — R1032 Left lower quadrant pain: Secondary | ICD-10-CM | POA: Diagnosis not present

## 2017-11-28 DIAGNOSIS — E785 Hyperlipidemia, unspecified: Secondary | ICD-10-CM | POA: Diagnosis not present

## 2017-11-28 DIAGNOSIS — Z951 Presence of aortocoronary bypass graft: Secondary | ICD-10-CM | POA: Diagnosis not present

## 2017-11-28 DIAGNOSIS — I255 Ischemic cardiomyopathy: Secondary | ICD-10-CM | POA: Diagnosis not present

## 2017-11-28 DIAGNOSIS — G2581 Restless legs syndrome: Secondary | ICD-10-CM | POA: Diagnosis present

## 2017-11-28 DIAGNOSIS — I129 Hypertensive chronic kidney disease with stage 1 through stage 4 chronic kidney disease, or unspecified chronic kidney disease: Secondary | ICD-10-CM | POA: Diagnosis not present

## 2017-11-28 DIAGNOSIS — I252 Old myocardial infarction: Secondary | ICD-10-CM | POA: Diagnosis not present

## 2017-11-28 DIAGNOSIS — R0602 Shortness of breath: Secondary | ICD-10-CM | POA: Diagnosis not present

## 2017-11-28 DIAGNOSIS — K56699 Other intestinal obstruction unspecified as to partial versus complete obstruction: Secondary | ICD-10-CM | POA: Diagnosis not present

## 2017-11-28 DIAGNOSIS — Z933 Colostomy status: Secondary | ICD-10-CM

## 2017-11-28 DIAGNOSIS — R748 Abnormal levels of other serum enzymes: Secondary | ICD-10-CM

## 2017-11-28 DIAGNOSIS — R9431 Abnormal electrocardiogram [ECG] [EKG]: Secondary | ICD-10-CM | POA: Diagnosis not present

## 2017-11-28 DIAGNOSIS — N131 Hydronephrosis with ureteral stricture, not elsewhere classified: Secondary | ICD-10-CM | POA: Diagnosis not present

## 2017-11-28 DIAGNOSIS — K560 Paralytic ileus: Secondary | ICD-10-CM | POA: Diagnosis not present

## 2017-11-28 DIAGNOSIS — M545 Low back pain: Secondary | ICD-10-CM

## 2017-11-28 DIAGNOSIS — K581 Irritable bowel syndrome with constipation: Secondary | ICD-10-CM

## 2017-11-28 DIAGNOSIS — R778 Other specified abnormalities of plasma proteins: Secondary | ICD-10-CM | POA: Diagnosis present

## 2017-11-28 DIAGNOSIS — D696 Thrombocytopenia, unspecified: Secondary | ICD-10-CM | POA: Diagnosis not present

## 2017-11-28 DIAGNOSIS — N289 Disorder of kidney and ureter, unspecified: Secondary | ICD-10-CM | POA: Diagnosis not present

## 2017-11-28 DIAGNOSIS — G8929 Other chronic pain: Secondary | ICD-10-CM | POA: Diagnosis present

## 2017-11-28 DIAGNOSIS — I739 Peripheral vascular disease, unspecified: Secondary | ICD-10-CM | POA: Diagnosis present

## 2017-11-28 DIAGNOSIS — I25118 Atherosclerotic heart disease of native coronary artery with other forms of angina pectoris: Secondary | ICD-10-CM | POA: Diagnosis present

## 2017-11-28 DIAGNOSIS — R7989 Other specified abnormal findings of blood chemistry: Secondary | ICD-10-CM

## 2017-11-28 DIAGNOSIS — K219 Gastro-esophageal reflux disease without esophagitis: Secondary | ICD-10-CM | POA: Diagnosis not present

## 2017-11-28 DIAGNOSIS — Z433 Encounter for attention to colostomy: Secondary | ICD-10-CM | POA: Diagnosis not present

## 2017-11-28 DIAGNOSIS — K624 Stenosis of anus and rectum: Secondary | ICD-10-CM

## 2017-11-28 DIAGNOSIS — R509 Fever, unspecified: Secondary | ICD-10-CM | POA: Diagnosis not present

## 2017-11-28 DIAGNOSIS — D638 Anemia in other chronic diseases classified elsewhere: Secondary | ICD-10-CM | POA: Diagnosis present

## 2017-11-28 DIAGNOSIS — N5089 Other specified disorders of the male genital organs: Secondary | ICD-10-CM | POA: Diagnosis present

## 2017-11-28 DIAGNOSIS — K567 Ileus, unspecified: Secondary | ICD-10-CM | POA: Diagnosis not present

## 2017-11-28 DIAGNOSIS — I452 Bifascicular block: Secondary | ICD-10-CM | POA: Diagnosis not present

## 2017-11-28 DIAGNOSIS — E538 Deficiency of other specified B group vitamins: Secondary | ICD-10-CM | POA: Diagnosis present

## 2017-11-28 DIAGNOSIS — Z936 Other artificial openings of urinary tract status: Secondary | ICD-10-CM | POA: Diagnosis not present

## 2017-11-28 DIAGNOSIS — R5381 Other malaise: Secondary | ICD-10-CM | POA: Diagnosis not present

## 2017-11-28 DIAGNOSIS — N1330 Unspecified hydronephrosis: Secondary | ICD-10-CM | POA: Diagnosis not present

## 2017-11-28 DIAGNOSIS — K5900 Constipation, unspecified: Secondary | ICD-10-CM | POA: Diagnosis not present

## 2017-11-28 DIAGNOSIS — N136 Pyonephrosis: Secondary | ICD-10-CM | POA: Diagnosis not present

## 2017-11-28 DIAGNOSIS — R944 Abnormal results of kidney function studies: Secondary | ICD-10-CM | POA: Diagnosis not present

## 2017-11-28 DIAGNOSIS — C480 Malignant neoplasm of retroperitoneum: Secondary | ICD-10-CM | POA: Diagnosis not present

## 2017-11-28 DIAGNOSIS — R319 Hematuria, unspecified: Secondary | ICD-10-CM | POA: Diagnosis not present

## 2017-11-28 DIAGNOSIS — C785 Secondary malignant neoplasm of large intestine and rectum: Secondary | ICD-10-CM | POA: Diagnosis not present

## 2017-11-28 DIAGNOSIS — C786 Secondary malignant neoplasm of retroperitoneum and peritoneum: Secondary | ICD-10-CM | POA: Diagnosis not present

## 2017-11-28 HISTORY — PX: IR NEPHROSTOMY PLACEMENT LEFT: IMG6063

## 2017-11-28 LAB — URINALYSIS, ROUTINE W REFLEX MICROSCOPIC
BILIRUBIN URINE: NEGATIVE
Bacteria, UA: NONE SEEN
GLUCOSE, UA: NEGATIVE mg/dL
KETONES UR: NEGATIVE mg/dL
LEUKOCYTES UA: NEGATIVE
NITRITE: NEGATIVE
PH: 6 (ref 5.0–8.0)
Protein, ur: 30 mg/dL — AB
Specific Gravity, Urine: 1.015 (ref 1.005–1.030)
Squamous Epithelial / LPF: NONE SEEN

## 2017-11-28 LAB — COMPREHENSIVE METABOLIC PANEL
ALBUMIN: 4 g/dL (ref 3.5–5.0)
ALT: 18 U/L (ref 17–63)
ANION GAP: 9 (ref 5–15)
AST: 23 U/L (ref 15–41)
Alkaline Phosphatase: 51 U/L (ref 38–126)
BILIRUBIN TOTAL: 1.1 mg/dL (ref 0.3–1.2)
BUN: 24 mg/dL — ABNORMAL HIGH (ref 6–20)
CO2: 20 mmol/L — ABNORMAL LOW (ref 22–32)
Calcium: 9.1 mg/dL (ref 8.9–10.3)
Chloride: 104 mmol/L (ref 101–111)
Creatinine, Ser: 1.56 mg/dL — ABNORMAL HIGH (ref 0.61–1.24)
GFR calc Af Amer: 47 mL/min — ABNORMAL LOW (ref >60)
GFR, EST NON AFRICAN AMERICAN: 40 mL/min — AB (ref >60)
Glucose, Bld: 100 mg/dL — ABNORMAL HIGH (ref 65–99)
POTASSIUM: 4.3 mmol/L (ref 3.5–5.1)
Sodium: 133 mmol/L — ABNORMAL LOW (ref 135–145)
TOTAL PROTEIN: 6.7 g/dL (ref 6.5–8.1)

## 2017-11-28 LAB — CBC WITH DIFFERENTIAL/PLATELET
BASOS ABS: 0 10*3/uL (ref 0.0–0.1)
Basophils Relative: 0 %
Eosinophils Absolute: 0.1 10*3/uL (ref 0.0–0.7)
Eosinophils Relative: 1 %
HEMATOCRIT: 27.5 % — AB (ref 39.0–52.0)
Hemoglobin: 9.4 g/dL — ABNORMAL LOW (ref 13.0–17.0)
LYMPHS ABS: 1.8 10*3/uL (ref 0.7–4.0)
Lymphocytes Relative: 18 %
MCH: 31.2 pg (ref 26.0–34.0)
MCHC: 34.2 g/dL (ref 30.0–36.0)
MCV: 91.4 fL (ref 78.0–100.0)
MONO ABS: 1.1 10*3/uL — AB (ref 0.1–1.0)
MONOS PCT: 11 %
NEUTROS ABS: 6.9 10*3/uL (ref 1.7–7.7)
Neutrophils Relative %: 70 %
Platelets: 72 10*3/uL — ABNORMAL LOW (ref 150–400)
RBC: 3.01 MIL/uL — ABNORMAL LOW (ref 4.22–5.81)
RDW: 16.4 % — ABNORMAL HIGH (ref 11.5–15.5)
WBC: 9.9 10*3/uL (ref 4.0–10.5)

## 2017-11-28 LAB — I-STAT TROPONIN, ED: Troponin i, poc: 0.14 ng/mL (ref 0.00–0.08)

## 2017-11-28 LAB — TROPONIN I: Troponin I: 0.2 ng/mL (ref <0.03)

## 2017-11-28 LAB — I-STAT CG4 LACTIC ACID, ED: LACTIC ACID, VENOUS: 0.72 mmol/L (ref 0.5–1.9)

## 2017-11-28 MED ORDER — LACTATED RINGERS IV BOLUS (SEPSIS)
500.0000 mL | Freq: Once | INTRAVENOUS | Status: AC
  Administered 2017-11-28: 500 mL via INTRAVENOUS

## 2017-11-28 MED ORDER — MIDAZOLAM HCL 2 MG/2ML IJ SOLN
INTRAMUSCULAR | Status: AC
  Administered 2017-11-28: 20:00:00
  Filled 2017-11-28: qty 2

## 2017-11-28 MED ORDER — PIPERACILLIN-TAZOBACTAM 3.375 G IVPB 30 MIN
3.3750 g | Freq: Once | INTRAVENOUS | Status: AC
  Administered 2017-11-28: 3.375 g via INTRAVENOUS
  Filled 2017-11-28: qty 50

## 2017-11-28 MED ORDER — SENNA 8.6 MG PO TABS
1.0000 | ORAL_TABLET | Freq: Two times a day (BID) | ORAL | Status: DC
  Administered 2017-11-28 – 2017-11-30 (×4): 8.6 mg via ORAL
  Filled 2017-11-28 (×4): qty 1

## 2017-11-28 MED ORDER — BISACODYL 10 MG RE SUPP: 10.0000 mg | Freq: Every day | RECTAL | Status: DC | PRN

## 2017-11-28 MED ORDER — NITROGLYCERIN 0.4 MG SL SUBL
0.4000 mg | SUBLINGUAL_TABLET | SUBLINGUAL | Status: DC | PRN
  Administered 2017-12-01 (×2): 0.4 mg via SUBLINGUAL
  Filled 2017-11-28: qty 1

## 2017-11-28 MED ORDER — OXYCODONE HCL 5 MG PO TABS
5.0000 mg | ORAL_TABLET | ORAL | Status: DC | PRN
  Administered 2017-11-30 – 2017-12-01 (×2): 5 mg via ORAL
  Filled 2017-11-28 (×2): qty 1

## 2017-11-28 MED ORDER — VANCOMYCIN HCL IN DEXTROSE 1-5 GM/200ML-% IV SOLN
1000.0000 mg | Freq: Once | INTRAVENOUS | Status: AC
  Administered 2017-11-28: 1000 mg via INTRAVENOUS
  Filled 2017-11-28: qty 200

## 2017-11-28 MED ORDER — PRAMIPEXOLE DIHYDROCHLORIDE 0.25 MG PO TABS
0.2500 mg | ORAL_TABLET | Freq: Three times a day (TID) | ORAL | Status: DC
  Administered 2017-11-28 – 2017-12-01 (×7): 0.25 mg via ORAL
  Filled 2017-11-28 (×9): qty 1

## 2017-11-28 MED ORDER — FENTANYL CITRATE (PF) 100 MCG/2ML IJ SOLN
INTRAMUSCULAR | Status: AC | PRN
  Administered 2017-11-28 (×2): 25 ug via INTRAVENOUS

## 2017-11-28 MED ORDER — IOPAMIDOL (ISOVUE-300) INJECTION 61%
50.0000 mL | Freq: Once | INTRAVENOUS | Status: AC | PRN
  Administered 2017-11-28: 15 mL

## 2017-11-28 MED ORDER — ACETAMINOPHEN 650 MG RE SUPP: 650.0000 mg | Freq: Four times a day (QID) | RECTAL | Status: DC | PRN

## 2017-11-28 MED ORDER — SODIUM CHLORIDE 0.9 % IV SOLN
INTRAVENOUS | Status: DC
  Administered 2017-11-28: 22:00:00 via INTRAVENOUS

## 2017-11-28 MED ORDER — MIDAZOLAM HCL 2 MG/2ML IJ SOLN
INTRAMUSCULAR | Status: AC | PRN
  Administered 2017-11-28: 1 mg via INTRAVENOUS

## 2017-11-28 MED ORDER — SODIUM CHLORIDE 0.9% FLUSH
5.0000 mL | Freq: Three times a day (TID) | INTRAVENOUS | Status: DC
  Administered 2017-11-29 – 2017-12-04 (×10): 5 mL via INTRAVENOUS

## 2017-11-28 MED ORDER — METOPROLOL TARTRATE 5 MG/5ML IV SOLN
5.0000 mg | Freq: Three times a day (TID) | INTRAVENOUS | Status: DC
  Administered 2017-11-28 – 2017-11-29 (×3): 5 mg via INTRAVENOUS
  Filled 2017-11-28 (×3): qty 5

## 2017-11-28 MED ORDER — VANCOMYCIN HCL IN DEXTROSE 1-5 GM/200ML-% IV SOLN
1000.0000 mg | INTRAVENOUS | Status: DC
  Administered 2017-11-29 – 2017-11-30 (×2): 1000 mg via INTRAVENOUS
  Filled 2017-11-28: qty 200

## 2017-11-28 MED ORDER — ISOSORBIDE MONONITRATE ER 60 MG PO TB24
60.0000 mg | ORAL_TABLET | Freq: Every day | ORAL | Status: DC
  Administered 2017-11-28 – 2017-12-04 (×7): 60 mg via ORAL
  Filled 2017-11-28 (×8): qty 1

## 2017-11-28 MED ORDER — LIDOCAINE HCL 1 % IJ SOLN
INTRAMUSCULAR | Status: AC | PRN
  Administered 2017-11-28: 10 mL

## 2017-11-28 MED ORDER — MORPHINE SULFATE (PF) 2 MG/ML IV SOLN
2.0000 mg | INTRAVENOUS | Status: DC | PRN
  Administered 2017-11-29: 2 mg via INTRAVENOUS
  Filled 2017-11-28: qty 1

## 2017-11-28 MED ORDER — ONDANSETRON HCL 4 MG/2ML IJ SOLN: 4.0000 mg | Freq: Four times a day (QID) | INTRAMUSCULAR | Status: DC | PRN

## 2017-11-28 MED ORDER — LIDOCAINE HCL 1 % IJ SOLN
INTRAMUSCULAR | Status: AC
  Administered 2017-11-28: 20:00:00
  Filled 2017-11-28: qty 20

## 2017-11-28 MED ORDER — ACETAMINOPHEN 325 MG PO TABS: 650.0000 mg | ORAL_TABLET | Freq: Four times a day (QID) | ORAL | Status: DC | PRN

## 2017-11-28 MED ORDER — DEXTROSE 5 % IV SOLN: 2.0000 g | Freq: Once | INTRAVENOUS | Status: DC

## 2017-11-28 MED ORDER — IOPAMIDOL (ISOVUE-300) INJECTION 61%
INTRAVENOUS | Status: AC
  Administered 2017-11-28: 15 mL
  Filled 2017-11-28: qty 50

## 2017-11-28 MED ORDER — PANTOPRAZOLE SODIUM 40 MG IV SOLR
40.0000 mg | INTRAVENOUS | Status: DC
  Administered 2017-11-28 – 2017-11-29 (×2): 40 mg via INTRAVENOUS
  Filled 2017-11-28 (×2): qty 40

## 2017-11-28 MED ORDER — DEXTROSE 5 % IV SOLN
2.0000 g | INTRAVENOUS | Status: DC
  Administered 2017-11-29 – 2017-11-30 (×3): 2 g via INTRAVENOUS
  Filled 2017-11-28 (×3): qty 2

## 2017-11-28 MED ORDER — ALBUTEROL SULFATE (2.5 MG/3ML) 0.083% IN NEBU: 2.5000 mg | INHALATION_SOLUTION | RESPIRATORY_TRACT | Status: DC | PRN

## 2017-11-28 MED ORDER — HYDROCORTISONE ACETATE 25 MG RE SUPP
25.0000 mg | RECTAL | Status: DC | PRN
  Filled 2017-11-28: qty 1

## 2017-11-28 MED ORDER — FENTANYL CITRATE (PF) 100 MCG/2ML IJ SOLN
INTRAMUSCULAR | Status: AC
  Administered 2017-11-28: 20:00:00
  Filled 2017-11-28: qty 2

## 2017-11-28 MED ORDER — ONDANSETRON HCL 4 MG PO TABS: 4.0000 mg | ORAL_TABLET | Freq: Four times a day (QID) | ORAL | Status: DC | PRN

## 2017-11-28 NOTE — H&P (Signed)
Chief Complaint: Patient was seen in consultation today for left percutaneous nephrostomy by Dr. Irine Seal  Referring Physician(s): Irine Seal, M.D.  Patient Status: [facility]  History of Present Illness: Charles Coffey is a 82 y.o. 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  Unable at that time to place a retrograde left ureteral stent.  Now left flank pain and fever as well as ileus.  CT today demonstrates severe left hydronephrosis with retained contrast in collecting system from retrograde study on Thursday.   Past Medical History:  Diagnosis Date  . Aortic atherosclerosis ([place])   . BPH with urinary obstruction   . CAD (coronary artery disease)    a.  MI 1995, CABG x 3 2002 (patient says that he had LIMA and RIMA grafts). b. ETT-Cardiolite (10/15) with EF 48%, apical scar, no ischemia. c. Nuc 07/2017 abnormal -> cath was performed,  patent LIMA to LAD and SVG to OM 2. RIMA to RCA is atretic. The right coronary artery is occluded distally with extensive collaterals from the LAD, medical therapy.   . Cervical spondylosis without myelopathy Sep 04, 202017  . Chronic low back pain    sees Dr. Kary Kos   . GERD (gastroesophageal reflux disease)   . Hiatal hernia   . Hyperlipidemia   . Hypertension   . IBS (irritable bowel syndrome)   . Ischemic cardiomyopathy   . Myocardial infarction ([place])   . RBBB   . Restless legs syndrome   . Statin intolerance   . Syncope    a. in 2015 - no apparent cause, was taking sleep medicine at the time. Cardiac workup unremarkable.  . Tubular adenoma of colon     Past Surgical History:  Procedure Laterality Date  . COLONOSCOPY  10/17/2014   per Dr. Hilarie Fredrickson, tubular adenomas, repeat in 3 yrs   . CYSTOSCOPY W/ RETROGRADES Left 11/25/2017   Procedure: CYSTOSCOPY WITH RETROGRADE PYELOGRAM;  Surgeon: Irine Seal, MD;  Location: [facility];  Service: Urology;   Laterality: Left;  . HEART BYPASS    . LEFT HEART CATH AND CORS/GRAFTS ANGIOGRAPHY N/A 08/25/2017   Procedure: LEFT HEART CATH AND CORS/GRAFTS ANGIOGRAPHY;  Surgeon: Wellington Hampshire, MD;  Location: [place] [facility];  Service: Cardiovascular;  Laterality: N/A;  . LUMBAR FUSION  2003   L3-L4  . PROSTATE SURGERY  06-27-12   per Dr. Roni Bread, had CTT  . TONSILLECTOMY    . TRANSURETHRAL RESECTION OF BLADDER TUMOR N/A 11/25/2017   Procedure: TRANSURETHRAL RESECTION OF BLADDER TUMOR (TURBT);  Surgeon: Irine Seal, MD;  Location: [facility];  Service: Urology;  Laterality: N/A;    Allergies: Antihistamines, loratadine-type and Statins  Medications: Prior to Admission medications   Medication Sig Start Date End Date Taking? Authorizing Provider  acetaminophen (TYLENOL) 500 MG tablet Take 1,000 mg by mouth every 8 (eight) hours as needed for mild pain or moderate pain.   Yes [provider]  aspirin 81 MG tablet Take 81 mg by mouth daily.     Yes [provider]  carvedilol (COREG) 6.25 MG tablet Take 1 tablet (6.25 mg total) 2 (two) times daily with a meal by mouth. 11/09/17  Yes Larey Dresser, MD  diphenhydramine-acetaminophen (TYLENOL PM EXTRA STRENGTH) 25-500 MG TABS tablet Take 1 tablet by mouth at bedtime as needed.    Yes [provider]  Hydrocortisone Acetate (HEMORRHOIDAL-HC RE) Place 1 application rectally as needed (  hemorrhoids).   Yes [provider]  isosorbide mononitrate (IMDUR) 60 MG 24 hr tablet Take 1 tablet (60 mg total) by mouth daily. 10/28/17 01/26/18 Yes End, Harrell Gave, MD  lubiprostone (AMITIZA) 24 MCG capsule Take 1 capsule (24 mcg total) by mouth 2 (two) times daily with a meal. 11/23/17  Yes Pyrtle, Lajuan Lines, MD  magnesium citrate SOLN Take 1 Bottle by mouth once.   Yes [provider]  nitroGLYCERIN (NITROSTAT) 0.4 MG SL tablet Place 1 tablet (0.4 mg total) under the tongue every 5 (five) minutes as needed. Patient taking  differently: Place 0.4 mg under the tongue every 5 (five) minutes as needed for chest pain.  08/19/17 11/28/17 Yes Dunn, Dayna N, PA-C  Plant Sterols and Stanols (CHOLESTOFF PO) Take 2 tablets by mouth 2 (two) times daily.   Yes [provider]  polyethylene glycol (MIRALAX / GLYCOLAX) packet Take 17 g by mouth daily as needed for moderate constipation.   Yes [provider]  pramipexole (MIRAPEX) 1 MG tablet TAKE 1 AND 1/2 TABLETS BY MOUTH AT BEDTIME 06/29/17  Yes [name], MD  ranitidine (ZANTAC) 300 MG tablet TAKE 1 TABLET BY MOUTH TWICE A DAY 10/04/17  Yes Pyrtle, Lajuan Lines, MD  Tetrahydroz-Glyc-Hyprom-PEG (VISINE MAXIMUM REDNESS RELIEF OP) Place 2 drops into both eyes 2 (two) times daily as needed (dry eyes).    Yes [provider]  traMADol (ULTRAM) 50 MG tablet Take 50 mg by mouth every 6 (six) hours as needed for moderate pain.    Yes [provider]  linaclotide (LINZESS) 290 MCG CAPS capsule TAKE 1 CAPSULE (290 MCG TOTAL) BY MOUTH DAILY. [place] Patient not taking: Reported on 11/24/2017 03/30/17   Pyrtle, Lajuan Lines, MD     Family History  Problem Relation Age of Onset  . Heart disease Mother   . Heart attack Mother   . Aneurysm Father        femoral artery  . Heart disease Brother   . Heart disease Maternal Uncle        x 2  . Colon cancer Neg Hx   . Esophageal cancer Neg Hx   . Pancreatic cancer Neg Hx   . Kidney disease Neg Hx   . Liver disease Neg Hx     Social History   Socioeconomic History  . Marital status: Married    Spouse name: None  . Number of children: 5  . Years of education: Some coll  . Highest education level: None  Social Needs  . Financial resource strain: None  . Food insecurity - worry: None  . Food insecurity - inability: None  . Transportation needs - medical: None  . Transportation needs - non-medical: None  Occupational History  . Occupation: retired  Tobacco Use  . Smoking status: Former  Smoker    Types: Cigarettes    Last attempt to quit: 12/28/1978    Years since quitting: 38.9  . Smokeless tobacco: Never Used  Substance and Sexual Activity  . Alcohol use: Yes    Alcohol/week: 0.0 oz    Comment: occ-  . Drug use: No  . Sexual activity: None  Other Topics Concern  . None  Social History Narrative   Lives at home w/ his wife   Right-handed   5 cups of coffee per day    Review of Systems: A 12 point ROS discussed and pertinent positives are indicated in the HPI above.  All other systems  are negative.  Review of Systems  Constitutional: Positive for activity change and fatigue.  Respiratory: Negative.   Cardiovascular: Negative.   Gastrointestinal: Positive for constipation.  Genitourinary: Positive for flank pain.  Musculoskeletal: Positive for back pain.  Neurological: Negative.     Vital Signs: BP 130/64 (BP Location: Right Arm)   Pulse 66   Temp (!) 101.8 F (38.8 C) (Oral)   Resp 16   SpO2 92%   Physical Exam  Constitutional: He is oriented to person, place, and time.  Cardiovascular: Normal rate, regular rhythm and normal heart sounds. Exam reveals no gallop and no friction rub.  No murmur heard. Pulmonary/Chest: Effort normal and breath sounds normal. No stridor. No respiratory distress. He has no wheezes.  Abdominal: Soft. Bowel sounds are normal. He exhibits no distension. There is no rebound and no guarding.  Musculoskeletal: He exhibits no edema.  Neurological: He is alert and oriented to person, place, and time.  Vitals reviewed.   Imaging: Ct Abdomen Pelvis Wo Contrast  Result Date: 11/28/2017 CLINICAL DATA:  Family reports pt had renal and bladder biopsies on Thursday or Friday. Family reports pt has had a fever at home, nausea, dysuria, and AMS. Denies any antipyretic use today. [name]. EXAM: CT ABDOMEN AND PELVIS WITHOUT CONTRAST TECHNIQUE: Multidetector CT imaging of the abdomen and pelvis was  performed following the standard protocol without IV contrast. COMPARISON:  11/11/2017 FINDINGS: Lower chest: Small right and minimal left pleural effusions. Pleural effusions are new since the prior CT. Mild interstitial thickening at the lung bases as well as subsegmental atelectasis, greater on the right. Interstitial thickening is similar to the prior CT. No convincing pneumonia. Hepatobiliary: No focal liver abnormality is seen. No gallstones, gallbladder wall thickening, or biliary dilatation. Pancreas: Unremarkable. No pancreatic ductal dilatation or surrounding inflammatory changes. Spleen: Normal in size without focal abnormality. Adrenals/Urinary Tract: Residual contrast fills the moderately dilated left intrarenal collecting system and portions of the left ureter. There is mild dilation of the right intrarenal collecting system and portions of the right ureter. There is diffuse left renal cortical thinning. No renal masses. No visualized stones. No adrenal masses. Irregular thickening along the posterior aspect of the bladder, greater on the left, is similar to the prior CT. Stomach/Bowel: Stomach is unremarkable. The small bowel and colon show air-fluid levels, but are nondilated with no wall thickening or adjacent inflammation. This is consistent with a mild diffuse adynamic ileus. No evidence of obstruction. Mild colonic stool burden. Appendix not visualized. Vascular/Lymphatic: Aortic atherosclerosis. No enlarged abdominal or pelvic lymph nodes. Reproductive: Prostate gland is enlarged this, bulging against the posterior inferior bladder base, stable from the recent CT. Other: No abdominal wall hernia or abnormality. No abdominopelvic ascites. Musculoskeletal: No fracture or acute finding. No osteoblastic or osteolytic lesions. Stable changes from a previous L3-L4 posterior lumbar spine fusion. Chronic bilateral pars defects with a grade 1 anterolisthesis at L5-S1. IMPRESSION: 1. Moderate left  hydronephrosis. There is residual contrast filling of the left intrarenal collecting system and portions of the left ureter. This is consistent with moderate obstructive uropathy, which appears due to an area of irregular bladder wall thickening or mass crossing the left ureteral trigone. 2. Mild right hydronephrosis. Right hydronephrosis has increased from the previous CT scan. Left hydronephrosis is unchanged. There is left perinephric and peri ureteral stranding, which is similar to the prior exam which may be the result of the obstruction only, but infection should also be considered.  3. Small right and minimal left pleural effusions. Mild lung base interstitial thickening. Consider mild congestive heart failure given the symptoms of shortness of breath. Electronically Signed   By: Lajean Manes M.D.   On: 11/28/2017 16:18   Dg Chest Port 1 View  Result Date: 11/28/2017 CLINICAL DATA:  Per order- SOBHis chronic constipation has worsened over the past several days. He has also felt very poorly, unable to get out of bed today and low-grade fevers. Pt states Sob today. EXAM: PORTABLE CHEST 1 VIEW COMPARISON:  None. FINDINGS: There changes from prior cardiac surgery. The cardiac silhouette is normal in size and configuration. No mediastinal or hilar masses. No convincing adenopathy. Clear lungs.  No pleural effusion or pneumothorax. Skeletal structures are intact. IMPRESSION: No active disease. Electronically Signed   By: Lajean Manes M.D.   On: 11/28/2017 15:59   Dg C-arm 1-60 Min-no Report  Result Date: 11/25/2017 Fluoroscopy was utilized by the requesting physician.  No radiographic interpretation.    Labs:  CBC: Recent Labs    08/31/17 1135 09/15/17 1420 11/24/17 0813 11/28/17 1518  WBC 5.5 5.8 5.3 9.9  HGB 10.1* 10.0* 10.1* 9.4*  HCT 29.4* 29.5* 29.8* 27.5*  PLT 115* 114 Large platelets present* 82* 72*    COAGS: Recent Labs    08/24/17 0948  INR 1.0    BMP: Recent Labs     08/31/17 1135 11/24/17 0813 11/26/17 0526 11/28/17 1518  NA 139 139 137 133*  K 5.1 4.3 5.3* 4.3  CL 104 111 108 104  CO2 18* 22 22 20*  GLUCOSE 81 103* 157* 100*  BUN 22 19 19  24*  CALCIUM 9.6 9.4 9.3 9.1  CREATININE 1.61* 1.57* 1.58* 1.56*  GFRNONAA 40* 40* 40* 40*  GFRAA 46* 46* 46* 47*    LIVER FUNCTION TESTS: Recent Labs    12/01/16 0827 09/15/17 1420 11/28/17 1518  BILITOT 0.6  --  1.1  AST 15  --  23  ALT 15  --  18  ALKPHOS 40  --  51  PROT 6.4 6.2 6.7  ALBUMIN 4.1  --  4.0    Assessment and Plan:  For left percutaneous nephrostomy tube placement today to treat ureteral obstruction and developing urosepsis.  Risks and benefits of nephrostomy tube placement were discussed with the patient including, but not limited to, infection, bleeding, significant bleeding causing loss or decrease in renal function or damage to adjacent structures.   All of the patient's questions were answered, patient is agreeable to proceed.  Consent signed and in chart.  Thank you for this interesting consult.  I greatly enjoyed meeting Charles Coffey and look forward to participating in their care.  A copy of this report was sent to the requesting provider on this date.  Electronically Signed: Azzie Roup, MD 11/28/2017, 7:49 PM   I spent a total of 20 Minutes in face to face in clinical consultation, greater than 50% of which was counseling/coordinating care for left nephrostomy tube placement.

## 2017-11-28 NOTE — Progress Notes (Signed)
Patient ID: Charles Coffey, male   DOB: [birth year], 82 y.o.   MRN: [id number]  Interventional Radiology:  Contacted by Dr. [name].  Severe left hydronephrosis with retained contrast in left renal collecting system from retrograde procedure Thursday.  Due to fever and evidence of impending urosepsis from obstruction, will proceed with emergent left percutaneous nephrostomy tube placement tonight.  Will call in call team and proceed.  Consent will be obtained in IR.  Venetia Night. Kathlene Cote, M.D Pager:  [phone]

## 2017-11-28 NOTE — ED Notes (Signed)
Date and time results received: 11/28/17 10:36 PM (use smartphrase ".now" to insert current time)  Test: Troponin Critical Value: 0.2  Name of Provider Notified: X.Blount  Orders Received? Or Actions Taken?:

## 2017-11-28 NOTE — Telephone Encounter (Signed)
Patient's wife calls today on the patient's behalf.  His chronic constipation has worsened over the past several days.  He has also felt very poorly, unable to get out of bed today and low-grade fevers.  He underwent a urologic procedure by Dr. [name] on 11/29.  Tried one bottle of magnesium citrate yesterday without relief and is continuing to take Amitiza.  I advised him to remain on clear liquids until bowel move well.  Continue Amitiza.  Take 2 more bottles of magnesium citrate today about 3-4 hours apart.  Contact Dr. Ralene Muskrat office immediately to report fevers and malaise following his urologic procedure.

## 2017-11-28 NOTE — ED Notes (Signed)
Placed pt on O2 at 2L for sats 88% now sats 96%

## 2017-11-28 NOTE — H&P (Signed)
History and Physical    Charles Coffey LFY:[id number] DOB: Sep 05, 1935 DOA: 11/28/2017  PCP: [name], MD /cardiologist Dr.End/urologist Dr.Wrenn Patient coming from: Home  I have personally briefly reviewed patient's old medical records in [place]  Chief Complaint: Fever  HPI: Charles Coffey is a 82 y.o. male with medical history significant of invasive urothelial carcinoma involving left urethra and trigone status post recent cystoscopy and left retrograde pyelogram with TURBT on 11/25/2017, hypertension, hyperlipidemia, irritable bowel syndrome, ischemic cardiomyopathy, chronic right bundle branch block, gastroesophageal reflux disease, coronary artery disease status post CABG presented to the ED with 2-day history of fever as high as 101, constipation, nausea, some shortness of breath, an episode of chest pain which is since resolved, urinary hesitancy.  Patient endorses chills, an episode of nausea, constipation times 2 weeks, generalized weakness, decreased urine output.  Patient denies any productive cough, no melena, no hematemesis, no hematochezia.  Patient does endorse diffuse abdominal pressure, some abdominal bloating, lightheadedness/dizziness.  Patient with no other complaints.  ED Course: Patient was seen in the ED noted to have a temperature of 101.8, CBC with a hemoglobin of 9.4 platelet count of 72 otherwise is within normal limits.  Compressive metabolic profile with a bicarb of 20, glucose of 100, BUN of 24, creatinine of 1.56 otherwise was within normal limits.  Point-of-care troponin was 0.14.  Lactic acid level was 0.72.  EKG showed right bundle branch block and left anterior fascicular block.  X-ray was negative for any acute cardiopulmonary disease.  CT abdomen and pelvis done showed moderate left hydronephrosis with residual contrast filling left intrarenal collecting system and portions of the left ureter consistent with moderate obstructive uropathy which  appears due to area of irregular bladder wall thickening or mass crossing the left urethral trigone.  Mild right hydronephrosis has increased from prior CT scan.  Left hydronephrosis unchanged.  There is left perinephric and periurethral stranding similar to prior exam which may be result of obstruction only but infection could also be considered.  Small right and minimal left pleural effusions.  Mild lung base interstitial thickening consider mild congestive heart failure given symptoms of shortness of breath.  Blood cultures obtained.  Patient given a dose of IV vancomycin IV Zosyn in the ED.  Urology consultation was made.  Interventional radiology consultation was made. [facility] were called to admit the patient for further evaluation and management.  Review of Systems: As per HPI otherwise 10 point review of systems negative.   Past Medical History:  Diagnosis Date  . Aortic atherosclerosis ([place])   . BPH with urinary obstruction   . CAD (coronary artery disease)    a.  MI 1995, CABG x 3 2002 (patient says that he had LIMA and RIMA grafts). b. ETT-Cardiolite (10/15) with EF 48%, apical scar, no ischemia. c. Nuc 07/2017 abnormal -> cath was performed,  patent LIMA to LAD and SVG to OM 2. RIMA to RCA is atretic. The right coronary artery is occluded distally with extensive collaterals from the LAD, medical therapy.   . Cervical spondylosis without myelopathy 08/17/202017  . Chronic low back pain    sees Dr. Kary Kos   . GERD (gastroesophageal reflux disease)   . Hiatal hernia   . Hyperlipidemia   . Hypertension   . IBS (irritable bowel syndrome)   . Ischemic cardiomyopathy   . Myocardial infarction ([place])   . RBBB   . Restless legs syndrome   . Statin intolerance   .  Syncope    a. in 2015 - no apparent cause, was taking sleep medicine at the time. Cardiac workup unremarkable.  . Tubular adenoma of colon     Past Surgical History:  Procedure Laterality Date  . COLONOSCOPY   10/17/2014   per Dr. Hilarie Fredrickson, tubular adenomas, repeat in 3 yrs   . CYSTOSCOPY W/ RETROGRADES Left 11/25/2017   Procedure: CYSTOSCOPY WITH RETROGRADE PYELOGRAM;  Surgeon: Irine Seal, MD;  Location: [facility];  Service: Urology;  Laterality: Left;  . HEART BYPASS    . LEFT HEART CATH AND CORS/GRAFTS ANGIOGRAPHY N/A 08/25/2017   Procedure: LEFT HEART CATH AND CORS/GRAFTS ANGIOGRAPHY;  Surgeon: Wellington Hampshire, MD;  Location: [facility];  Service: Cardiovascular;  Laterality: N/A;  . LUMBAR FUSION  2003   L3-L4  . PROSTATE SURGERY  06-27-12   per Dr. Roni Bread, had CTT  . TONSILLECTOMY    . TRANSURETHRAL RESECTION OF BLADDER TUMOR N/A 11/25/2017   Procedure: TRANSURETHRAL RESECTION OF BLADDER TUMOR (TURBT);  Surgeon: Irine Seal, MD;  Location: [facility];  Service: Urology;  Laterality: N/A;     reports that he quit smoking about 38 years ago. His smoking use included cigarettes. he has never used smokeless tobacco. He reports that he drinks alcohol. He reports that he does not use drugs.  Allergies  Allergen Reactions  . Antihistamines, Loratadine-Type Other (See Comments)    Unable to urinate  . Statins Other (See Comments)    liver effects    Family History  Problem Relation Age of Onset  . Heart disease Mother   . Heart attack Mother   . Aneurysm Father        femoral artery  . Heart disease Brother   . Heart disease Maternal Uncle        x 2  . Colon cancer Neg Hx   . Esophageal cancer Neg Hx   . Pancreatic cancer Neg Hx   . Kidney disease Neg Hx   . Liver disease Neg Hx    Mother deceased at age 48 from old age.  Father deceased at age 80 from a aneurysm of the groin per patient.  Prior to Admission medications   Medication Sig Start Date End Date Taking? Authorizing Provider  acetaminophen (TYLENOL) 500 MG tablet Take 1,000 mg by mouth every 8 (eight) hours as needed for mild pain or moderate pain.   Yes [provider]  aspirin 81 MG tablet Take 81 mg by mouth  daily.     Yes [provider]  carvedilol (COREG) 6.25 MG tablet Take 1 tablet (6.25 mg total) 2 (two) times daily with a meal by mouth. 11/09/17  Yes Larey Dresser, MD  diphenhydramine-acetaminophen (TYLENOL PM EXTRA STRENGTH) 25-500 MG TABS tablet Take 1 tablet by mouth at bedtime as needed.    Yes [provider]  Hydrocortisone Acetate (HEMORRHOIDAL-HC RE) Place 1 application rectally as needed (hemorrhoids).   Yes [provider]  isosorbide mononitrate (IMDUR) 60 MG 24 hr tablet Take 1 tablet (60 mg total) by mouth daily. 10/28/17 01/26/18 Yes End, Harrell Gave, MD  lubiprostone (AMITIZA) 24 MCG capsule Take 1 capsule (24 mcg total) by mouth 2 (two) times daily with a meal. 11/23/17  Yes Pyrtle, Lajuan Lines, MD  magnesium citrate SOLN Take 1 Bottle by mouth once.   Yes [provider]  nitroGLYCERIN (NITROSTAT) 0.4 MG SL tablet Place 1 tablet (0.4 mg total) under the tongue every 5 (five) minutes as needed. Patient taking  differently: Place 0.4 mg under the tongue every 5 (five) minutes as needed for chest pain.  08/19/17 11/28/17 Yes Dunn, Dayna N, PA-C  Plant Sterols and Stanols (CHOLESTOFF PO) Take 2 tablets by mouth 2 (two) times daily.   Yes [provider]  polyethylene glycol (MIRALAX / GLYCOLAX) packet Take 17 g by mouth daily as needed for moderate constipation.   Yes [provider]  pramipexole (MIRAPEX) 1 MG tablet TAKE 1 AND 1/2 TABLETS BY MOUTH AT BEDTIME 06/29/17  Yes [name], MD  ranitidine (ZANTAC) 300 MG tablet TAKE 1 TABLET BY MOUTH TWICE A DAY 10/04/17  Yes Pyrtle, Lajuan Lines, MD  Tetrahydroz-Glyc-Hyprom-PEG (VISINE MAXIMUM REDNESS RELIEF OP) Place 2 drops into both eyes 2 (two) times daily as needed (dry eyes).    Yes [provider]  traMADol (ULTRAM) 50 MG tablet Take 50 mg by mouth every 6 (six) hours as needed for moderate pain.    Yes [provider]  linaclotide (LINZESS) 290 MCG CAPS capsule TAKE 1  CAPSULE (290 MCG TOTAL) BY MOUTH DAILY. [place] A MEAL Patient not taking: Reported on 11/24/2017 03/30/17   Jerene Bears, MD    Physical Exam: Vitals:   11/28/17 1443 11/28/17 1820 11/28/17 1904  BP: 132/81 130/79 130/64  Pulse: 81 69 66  Resp: 16 16 16   Temp: (!) 101.8 F (38.8 C)    TempSrc: Oral    SpO2: 94% 92% 92%    Constitutional: NAD, calm, comfortable Vitals:   11/28/17 1443 11/28/17 1820 11/28/17 1904  BP: 132/81 130/79 130/64  Pulse: 81 69 66  Resp: 16 16 16   Temp: (!) 101.8 F (38.8 C)    TempSrc: Oral    SpO2: 94% 92% 92%   Eyes: PERRLA, EOMI, lids and conjunctivae normal ENMT: Mucous membranes are dry. Posterior pharynx clear of any exudate or lesions.Normal dentition.  Neck: normal, supple, no masses, no thyromegaly Respiratory: clear to auscultation bilaterally, no wheezing, no crackles. Normal respiratory effort. No accessory muscle use.  Cardiovascular: Regular rate and rhythm, no murmurs / rubs / gallops. No extremity edema. 2+ pedal pulses. No carotid bruits.  Abdomen: Soft, mildly distended, nontender to palpation, hypoactive bowel sounds.  No hepatosplenomegaly.  Left flank pain. Musculoskeletal: no clubbing / cyanosis. No joint deformity upper and lower extremities. Good ROM, no contractures. Normal muscle tone.  Skin: no rashes, lesions, ulcers. No induration Neurologic: CN 2-12 grossly intact. Sensation intact, DTR normal. Strength 5/5 in all 4.  Psychiatric: Normal judgment and insight. Alert and oriented x 3. Normal mood.    Labs on Admission: I have personally reviewed following labs and imaging studies  CBC: Recent Labs  Lab 11/24/17 0813 11/28/17 1518  WBC 5.3 9.9  NEUTROABS  --  6.9  HGB 10.1* 9.4*  HCT 29.8* 27.5*  MCV 90.9 91.4  PLT 82* 72*   Basic Metabolic Panel: Recent Labs  Lab 11/24/17 0813 11/26/17 0526 11/28/17 1518  NA 139 137 133*  K 4.3 5.3* 4.3  CL 111 108 104  CO2 22 22 20*  GLUCOSE 103* 157* 100*    BUN 19 19 24*  CREATININE 1.57* 1.58* 1.56*  CALCIUM 9.4 9.3 9.1   GFR: Estimated Creatinine Clearance: 41.1 mL/min (A) (by C-G formula based on SCr of 1.56 mg/dL (H)). Liver Function Tests: Recent Labs  Lab 11/28/17 1518  AST 23  ALT 18  ALKPHOS 51  BILITOT 1.1  PROT 6.7  ALBUMIN 4.0   No results  for input(s): LIPASE, AMYLASE in the last 168 hours. No results for input(s): AMMONIA in the last 168 hours. Coagulation Profile: No results for input(s): INR, PROTIME in the last 168 hours. Cardiac Enzymes: No results for input(s): CKTOTAL, CKMB, CKMBINDEX, TROPONINI in the last 168 hours. BNP (last 3 results) No results for input(s): PROBNP in the last 8760 hours. HbA1C: No results for input(s): HGBA1C in the last 72 hours. CBG: No results for input(s): GLUCAP in the last 168 hours. Lipid Profile: No results for input(s): CHOL, HDL, LDLCALC, TRIG, CHOLHDL, LDLDIRECT in the last 72 hours. Thyroid Function Tests: No results for input(s): TSH, T4TOTAL, FREET4, T3FREE, THYROIDAB in the last 72 hours. Anemia Panel: No results for input(s): VITAMINB12, FOLATE, FERRITIN, TIBC, IRON, RETICCTPCT in the last 72 hours. Urine analysis:    Component Value Date/Time   COLORURINE YELLOW 11/28/2017 1818   APPEARANCEUR CLEAR 11/28/2017 1818   LABSPEC 1.015 11/28/2017 1818   PHURINE 6.0 11/28/2017 1818   GLUCOSEU NEGATIVE 11/28/2017 1818   HGBUR LARGE (A) 11/28/2017 1818   HGBUR negative 10/15/2010 1251   BILIRUBINUR NEGATIVE 11/28/2017 1818   BILIRUBINUR n 12/01/2016 1521   KETONESUR NEGATIVE 11/28/2017 1818   PROTEINUR 30 (A) 11/28/2017 1818   UROBILINOGEN 0.2 12/01/2016 1521   UROBILINOGEN 0.2 10/15/2010 1251   NITRITE NEGATIVE 11/28/2017 1818   LEUKOCYTESUR NEGATIVE 11/28/2017 1818    Radiological Exams on Admission: Ct Abdomen Pelvis Wo Contrast  Result Date: 11/28/2017 CLINICAL DATA:  Family reports pt had renal and bladder biopsies on Thursday or Friday. Family reports pt  has had a fever at home, nausea, dysuria, and AMS. Denies any antipyretic use today. [name]. EXAM: CT ABDOMEN AND PELVIS WITHOUT CONTRAST TECHNIQUE: Multidetector CT imaging of the abdomen and pelvis was performed following the standard protocol without IV contrast. COMPARISON:  11/11/2017 FINDINGS: Lower chest: Small right and minimal left pleural effusions. Pleural effusions are new since the prior CT. Mild interstitial thickening at the lung bases as well as subsegmental atelectasis, greater on the right. Interstitial thickening is similar to the prior CT. No convincing pneumonia. Hepatobiliary: No focal liver abnormality is seen. No gallstones, gallbladder wall thickening, or biliary dilatation. Pancreas: Unremarkable. No pancreatic ductal dilatation or surrounding inflammatory changes. Spleen: Normal in size without focal abnormality. Adrenals/Urinary Tract: Residual contrast fills the moderately dilated left intrarenal collecting system and portions of the left ureter. There is mild dilation of the right intrarenal collecting system and portions of the right ureter. There is diffuse left renal cortical thinning. No renal masses. No visualized stones. No adrenal masses. Irregular thickening along the posterior aspect of the bladder, greater on the left, is similar to the prior CT. Stomach/Bowel: Stomach is unremarkable. The small bowel and colon show air-fluid levels, but are nondilated with no wall thickening or adjacent inflammation. This is consistent with a mild diffuse adynamic ileus. No evidence of obstruction. Mild colonic stool burden. Appendix not visualized. Vascular/Lymphatic: Aortic atherosclerosis. No enlarged abdominal or pelvic lymph nodes. Reproductive: Prostate gland is enlarged this, bulging against the posterior inferior bladder base, stable from the recent CT. Other: No abdominal wall hernia or abnormality. No abdominopelvic ascites. Musculoskeletal: No  fracture or acute finding. No osteoblastic or osteolytic lesions. Stable changes from a previous L3-L4 posterior lumbar spine fusion. Chronic bilateral pars defects with a grade 1 anterolisthesis at L5-S1. IMPRESSION: 1. Moderate left hydronephrosis. There is residual contrast filling of the left intrarenal collecting system and portions of the left ureter.  This is consistent with moderate obstructive uropathy, which appears due to an area of irregular bladder wall thickening or mass crossing the left ureteral trigone. 2. Mild right hydronephrosis. Right hydronephrosis has increased from the previous CT scan. Left hydronephrosis is unchanged. There is left perinephric and peri ureteral stranding, which is similar to the prior exam which may be the result of the obstruction only, but infection should also be considered. 3. Small right and minimal left pleural effusions. Mild lung base interstitial thickening. Consider mild congestive heart failure given the symptoms of shortness of breath. Electronically Signed   By: Lajean Manes M.D.   On: 11/28/2017 16:18   Dg Chest Port 1 View  Result Date: 11/28/2017 CLINICAL DATA:  Per order- SOBHis chronic constipation has worsened over the past several days. He has also felt very poorly, unable to get out of bed today and low-grade fevers. Pt states Sob today. EXAM: PORTABLE CHEST 1 VIEW COMPARISON:  None. FINDINGS: There changes from prior cardiac surgery. The cardiac silhouette is normal in size and configuration. No mediastinal or hilar masses. No convincing adenopathy. Clear lungs.  No pleural effusion or pneumothorax. Skeletal structures are intact. IMPRESSION: No active disease. Electronically Signed   By: Lajean Manes M.D.   On: 11/28/2017 15:59    EKG: Independently reviewed.  Right bundle branch block with left anterior fascicular block unchanged compared to prior EKG.  Assessment/Plan Principal Problem:   Fever Active Problems:   Pyelonephritis    Hyperlipidemia LDL goal <70   RESTLESS LEGS SYNDROME   Essential hypertension   Coronary artery disease of native heart with stable angina pectoris (HCC)   GERD   Cardiomyopathy, ischemic   Chronic low back pain   PAD (peripheral artery disease) (HCC)   Anemia   Stage 3 chronic kidney disease (HCC)   Hydronephrosis due to obstructive malignant neoplasm of bladder (HCC)   Ileus (HCC)   Elevated troponin   Urothelial carcinoma of bladder (HCC)   Dehydration   Thrombocytopenia ([place])   #1 fever Questionable etiology.  Likely secondary to acute pyelonephritis as patient with recent instrumentation in the bladder with cystoscopy, left retrograde pyelogram and TURBT on 11/25/2017.  Patient also noted to have left flank pain on examination.  Urinalysis is pending.  Blood cultures have been ordered which are pending.  Chest x-ray negative for any acute infiltrate.  CT abdomen and pelvis with perinephric stranding on the left and periurethral stranding as well as moderate bilateral hydronephrosis.  Will place patient empirically on IV vancomycin and IV Rocephin.  Follow.  2.  Probable left-sided pyelonephritis The patient presented with fevers, malaise.  Patient with recent instrumentation the bladder with cystoscopy, left retrograde pyelogram and TURBT on 11/25/2017.  Patient also noted on examination to have left flank pain.  Urinalysis is pending.  Blood cultures have been ordered which are pending.  Placed empirically on IV Rocephin.  Follow.  3.  Bilateral hydronephrosis/malignant obstruction of left kidney from invasive urothelial carcinoma Patient has been seen in consultation by urology.  Urology has consulted interventional radiology for left percutaneous nephrostomy tube placement to be done this evening.  Urinalysis has been obtained a urine cultures are pending.  Post nephrostomy hopefully interventional radiology will be able to culture the urine post nephrostomy.  Placed empirically on  IV Rocephin.  Urology following.  4.  Elevated troponin Likely demand ischemia.  Patient had one episode of chest pain a day prior to admission which has since resolved.  EKG with  a right bundle branch block with left anterior fascicular block with no significant change from prior EKG.  Patient with recent 2D echo done in September 2018 as such we will not repeat at this time.  Cycle cardiac enzymes every 6 hours x3.  Place patient on IV Lopressor.  Unable to continue home oral medications due to ongoing ileus.  5.  Ileus Likely secondary to problem #3.  Check a magnesium level.  Keep potassium greater than 4.  Keep magnesium greater than 2.  Early ambulation.  Acute abdominal series in the morning.  Keep n.p.o.  IV fluids.  Pain management.  Supportive care.  Post nephrostomy tube placement if no significant improvement with ileus will consult with GI or general surgery for further evaluation and management.  6.  Chronic kidney disease stage III Stable.  Follow.  7.  Dehydration IV fluids.  8.  Hypertension Placed on IV Lopressor.  9.  Coronary artery disease/history of cardiomyopathy Patient noted to have elevated troponins.  Will cycle cardiac enzymes.  Patient currently asymptomatic.  Patient with a recent 2D echo and as such we will not repeat at this time.  Patient currently n.p.o. secondary to ileus and as such we will place on IV Lopressor.  Hold other oral cardiac medications for now.  #10 anemia No overt bleeding.  Check an anemia panel.  Follow H&H.  Transfusion threshold hemoglobin less than 7.  11.  Thrombocytopenia Questionable etiology.  Likely secondary to acute infection.  Check LDH, haptoglobin, peripheral smear.  Monitor closely.  Follow.  12.  Urothelial carcinoma of the bladder Per urology.  DVT prophylaxis: SCDs Code Status: Full Family Communication: Updated patient and wife at bedside. Disposition Plan: Likely home when medically stable with clinical  improvement.  To be determined. Consults called: Urology: Dr.Wrenn 11/28/2017/interventional radiology: Dr. Kathlene Cote 11/28/2017 Admission status: Admit to inpatient, telemetry floor.   Irine Seal MD [facility] Pager [phone]  If 7PM-7AM, please contact night-coverage [email or web address] Password [facility]  11/28/2017, 7:40 PM

## 2017-11-28 NOTE — ED Notes (Signed)
Patient transported to IR 

## 2017-11-28 NOTE — ED Triage Notes (Signed)
Family reports pt had renal and bladder biopsies on Thursday or Friday. Family reports pt has had a fever at home, nausea, dysuria, and AMS. Denies any antipyretic use today.

## 2017-11-28 NOTE — Sedation Documentation (Signed)
Patient denies pain and is resting comfortably.  

## 2017-11-28 NOTE — ED Notes (Signed)
Pt has urinal and is aware of need for urine specimen

## 2017-11-28 NOTE — ED Notes (Addendum)
Pt in interventional radiology at change of shift.

## 2017-11-28 NOTE — ED Notes (Signed)
Bed: WA17 Expected date:  Expected time:  Means of arrival:  Comments: 

## 2017-11-28 NOTE — ED Notes (Signed)
Dr Lita Mains requested to hold antibiotics until pt can give urine.

## 2017-11-28 NOTE — ED Notes (Signed)
pts bladder scan showed 267ml, pt then used the restroom and specimen collected was 281ml

## 2017-11-28 NOTE — ED Notes (Signed)
No respiratory or acute distress noted alert and oriented x 3 call light in reach no reaction to medication noted. 

## 2017-11-28 NOTE — Progress Notes (Signed)
Pharmacy Antibiotic Note  Charles Coffey is a 82 y.o. male with malignant obstruction of the left kidney from an invasive urothelial carcinoma recently discharged 11/30 s/p TURBT now re-admitted on 11/28/2017 with fever.  Patient received one-time dose of vancomycin 1g and zosyn 3.375g in the ED.  Vancomycin and Ceftriaxone on admission.  Pharmacy has been consulted for vancomycin dosing.   SCr 1.56 - has been stable for past week  Plan: Vancomycin 1g IV q24h.  Will administer first dose tonight for staggered loading dose since initial 1g dose in ED insufficient load. Daily SCr.    Temp (24hrs), Avg:101.8 F (38.8 C), Min:101.8 F (38.8 C), Max:101.8 F (38.8 C)  Recent Labs  Lab 11/24/17 0813 11/26/17 0526 11/28/17 1518 11/28/17 1538  WBC 5.3  --  9.9  --   CREATININE 1.57* 1.58* 1.56*  --   LATICACIDVEN  --   --   --  0.72    Estimated Creatinine Clearance: 41.1 mL/min (A) (by C-G formula based on SCr of 1.56 mg/dL (H)).    Allergies  Allergen Reactions  . Antihistamines, Loratadine-Type Other (See Comments)    Unable to urinate  . Statins Other (See Comments)    liver effects    Antimicrobials this admission: 12/2 Zosyn x 1 12/2 Vanc >>  12/2 Ceftriaxone >>   Dose adjustments this admission:  Microbiology results: 12/2 BCx:  12/2 UCx:  Thank you for allowing pharmacy to be a part of this patient's care.  Hershal Coria 11/28/2017 7:25 PM

## 2017-11-28 NOTE — Consult Note (Signed)
Subjective: CC: Fever.  Hx:  Charles Coffey is an 82 yo WM who had cystoscopy with left retrograde pyelogram and TURBT on 11/29 for what appears to be a muscle invasive urothelial carcinoma involving the left ureter and trigone.   I was unable to place a stent because of the obsruction.  He was discharged on 11/30 but returns today with fever and malaise.   He has rectal urgency with difficulty having a bowel movement and he has some left flank pain.   CT in the ER shows persistent bilateral hydro left > right with retained contrast from the retrograde.  He has fluid filled small bowel loops consistent with an ileus but no excess colonic stool.  He has small bilateral pleural effusions.   His WBC is 9.9 which is double his preop level.  His Cr is stable at 1.56 and a lactate level is normal.   ROS:  Review of Systems  Constitutional: Positive for fever.       He feels like he is dying.   Respiratory: Negative for shortness of breath.   Cardiovascular: Negative for chest pain.       He feels like his arms and legs are swollen.   Gastrointestinal: Positive for constipation. Negative for heartburn and nausea.  Genitourinary: Positive for flank pain.  All other systems reviewed and are negative.   Allergies  Allergen Reactions  . Antihistamines, Loratadine-Type Other (See Comments)    Unable to urinate  . Statins Other (See Comments)    liver effects    Past Medical History:  Diagnosis Date  . Aortic atherosclerosis ([place])   . BPH with urinary obstruction   . CAD (coronary artery disease)    a.  MI 1995, CABG x 3 2002 (patient says that he had LIMA and RIMA grafts). b. ETT-Cardiolite (10/15) with EF 48%, apical scar, no ischemia. c. Nuc 07/2017 abnormal -> cath was performed,  patent LIMA to LAD and SVG to OM 2. RIMA to RCA is atretic. The right coronary artery is occluded distally with extensive collaterals from the LAD, medical therapy.   . Cervical spondylosis without myelopathy 10/25/202017   . Chronic low back pain    sees Dr. Kary Kos   . GERD (gastroesophageal reflux disease)   . Hiatal hernia   . Hyperlipidemia   . Hypertension   . IBS (irritable bowel syndrome)   . Ischemic cardiomyopathy   . Myocardial infarction ([place])   . RBBB   . Restless legs syndrome   . Statin intolerance   . Syncope    a. in 2015 - no apparent cause, was taking sleep medicine at the time. Cardiac workup unremarkable.  . Tubular adenoma of colon     Past Surgical History:  Procedure Laterality Date  . COLONOSCOPY  10/17/2014   per Dr. Hilarie Fredrickson, tubular adenomas, repeat in 3 yrs   . CYSTOSCOPY W/ RETROGRADES Left 11/25/2017   Procedure: CYSTOSCOPY WITH RETROGRADE PYELOGRAM;  Surgeon: Irine Seal, MD;  Location: [facility];  Service: Urology;  Laterality: Left;  . HEART BYPASS    . LEFT HEART CATH AND CORS/GRAFTS ANGIOGRAPHY N/A 08/25/2017   Procedure: LEFT HEART CATH AND CORS/GRAFTS ANGIOGRAPHY;  Surgeon: Wellington Hampshire, MD;  Location: [facility];  Service: Cardiovascular;  Laterality: N/A;  . LUMBAR FUSION  2003   L3-L4  . PROSTATE SURGERY  06-27-12   per Dr. Roni Bread, had CTT  . TONSILLECTOMY    . TRANSURETHRAL RESECTION OF BLADDER TUMOR N/A 11/25/2017  Procedure: TRANSURETHRAL RESECTION OF BLADDER TUMOR (TURBT);  Surgeon: Irine Seal, MD;  Location: [facility];  Service: Urology;  Laterality: N/A;    Social History   Socioeconomic History  . Marital status: Married    Spouse name: Not on file  . Number of children: 5  . Years of education: Some coll  . Highest education level: Not on file  Social Needs  . Financial resource strain: Not on file  . Food insecurity - worry: Not on file  . Food insecurity - inability: Not on file  . Transportation needs - medical: Not on file  . Transportation needs - non-medical: Not on file  Occupational History  . Occupation: retired  Tobacco Use  . Smoking status: Former Smoker    Types: Cigarettes    Last attempt to quit: 12/28/1978     Years since quitting: 38.9  . Smokeless tobacco: Never Used  Substance and Sexual Activity  . Alcohol use: Yes    Alcohol/week: 0.0 oz    Comment: occ-  . Drug use: No  . Sexual activity: Not on file  Other Topics Concern  . Not on file  Social History Narrative   Lives at home w/ his wife   Right-handed   5 cups of coffee per day    Family History  Problem Relation Age of Onset  . Heart disease Mother   . Heart attack Mother   . Aneurysm Father        femoral artery  . Heart disease Brother   . Heart disease Maternal Uncle        x 2  . Colon cancer Neg Hx   . Esophageal cancer Neg Hx   . Pancreatic cancer Neg Hx   . Kidney disease Neg Hx   . Liver disease Neg Hx     Anti-infectives: Anti-infectives (From admission, onward)   None      No current facility-administered medications for this encounter.    Current Outpatient Medications  Medication Sig Dispense Refill  . acetaminophen (TYLENOL) 500 MG tablet Take 1,000 mg by mouth every 8 (eight) hours as needed for mild pain or moderate pain.    Marland Kitchen aspirin 81 MG tablet Take 81 mg by mouth daily.      . carvedilol (COREG) 6.25 MG tablet Take 1 tablet (6.25 mg total) 2 (two) times daily with a meal by mouth. 180 tablet 3  . diphenhydramine-acetaminophen (TYLENOL PM EXTRA STRENGTH) 25-500 MG TABS tablet Take 1 tablet by mouth at bedtime as needed.     . isosorbide mononitrate (IMDUR) 60 MG 24 hr tablet Take 1 tablet (60 mg total) by mouth daily. 90 tablet 1  . linaclotide (LINZESS) 290 MCG CAPS capsule TAKE 1 CAPSULE (290 MCG TOTAL) BY MOUTH DAILY. [place] (Patient not taking: Reported on 11/24/2017) 30 capsule 10  . lubiprostone (AMITIZA) 24 MCG capsule Take 1 capsule (24 mcg total) by mouth 2 (two) times daily with a meal. 60 capsule 3  . nitroGLYCERIN (NITROSTAT) 0.4 MG SL tablet Place 1 tablet (0.4 mg total) under the tongue every 5 (five) minutes as needed. 25 tablet 3  . Plant Sterols and Stanols  (CHOLESTOFF PO) Take 2 tablets by mouth 2 (two) times daily.    . pramipexole (MIRAPEX) 1 MG tablet TAKE 1 AND 1/2 TABLETS BY MOUTH AT BEDTIME 45 tablet 11  . ranitidine (ZANTAC) 300 MG tablet TAKE 1 TABLET BY MOUTH TWICE A DAY 60 tablet 3  .  Tetrahydroz-Glyc-Hyprom-PEG (VISINE MAXIMUM REDNESS RELIEF OP) Place 2 drops into both eyes 2 (two) times daily as needed (dry eyes).     . traMADol (ULTRAM) 50 MG tablet Take 50 mg by mouth every 6 (six) hours as needed for moderate pain.        Objective: Vital signs in last 24 hours: Temp:  [101.8 F (38.8 C)] 101.8 F (38.8 C) (12/02 1443) Pulse Rate:  [81] 81 (12/02 1443) Resp:  [16] 16 (12/02 1443) BP: (132)/(81) 132/81 (12/02 1443) SpO2:  [94 %] 94 % (12/02 1443)  Intake/Output from previous day: No intake/output data recorded. Intake/Output this shift: No intake/output data recorded.   Physical Exam  Constitutional: He is oriented to person, place, and time and well-developed, well-nourished, and in no distress.  HENT:  Head: Normocephalic and atraumatic.  Neck: Normal range of motion. Neck supple.  Cardiovascular: Normal rate, regular rhythm and normal heart sounds.  Pulmonary/Chest: Effort normal and breath sounds normal. No respiratory distress.  Abdominal: Soft. There is tenderness (Left CVAT).  Musculoskeletal: Normal range of motion. He exhibits no edema or tenderness.  Lymphadenopathy:    He has no cervical adenopathy.  Neurological: He is alert and oriented to person, place, and time.  Skin: Skin is warm and dry.  Psychiatric: Mood and affect normal.  Vitals reviewed.   Lab Results:  Recent Labs    11/28/17 1518  WBC 9.9  HGB 9.4*  HCT 27.5*  PLT 72*   BMET Recent Labs    11/26/17 0526 11/28/17 1518  NA 137 133*  K 5.3* 4.3  CL 108 104  CO2 22 20*  GLUCOSE 157* 100*  BUN 19 24*  CREATININE 1.58* 1.56*  CALCIUM 9.3 9.1   PT/INR No results for input(s): LABPROT, INR in the last 72 hours. ABG No  results for input(s): PHART, HCO3 in the last 72 hours.  Invalid input(s): PCO2, PO2  Studies/Results: Ct Abdomen Pelvis Wo Contrast  Result Date: 11/28/2017 CLINICAL DATA:  Family reports pt had renal and bladder biopsies on Thursday or Friday. Family reports pt has had a fever at home, nausea, dysuria, and AMS. Denies any antipyretic use today. [name]. EXAM: CT ABDOMEN AND PELVIS WITHOUT CONTRAST TECHNIQUE: Multidetector CT imaging of the abdomen and pelvis was performed following the standard protocol without IV contrast. COMPARISON:  11/11/2017 FINDINGS: Lower chest: Small right and minimal left pleural effusions. Pleural effusions are new since the prior CT. Mild interstitial thickening at the lung bases as well as subsegmental atelectasis, greater on the right. Interstitial thickening is similar to the prior CT. No convincing pneumonia. Hepatobiliary: No focal liver abnormality is seen. No gallstones, gallbladder wall thickening, or biliary dilatation. Pancreas: Unremarkable. No pancreatic ductal dilatation or surrounding inflammatory changes. Spleen: Normal in size without focal abnormality. Adrenals/Urinary Tract: Residual contrast fills the moderately dilated left intrarenal collecting system and portions of the left ureter. There is mild dilation of the right intrarenal collecting system and portions of the right ureter. There is diffuse left renal cortical thinning. No renal masses. No visualized stones. No adrenal masses. Irregular thickening along the posterior aspect of the bladder, greater on the left, is similar to the prior CT. Stomach/Bowel: Stomach is unremarkable. The small bowel and colon show air-fluid levels, but are nondilated with no wall thickening or adjacent inflammation. This is consistent with a mild diffuse adynamic ileus. No evidence of obstruction. Mild colonic stool burden. Appendix not visualized. Vascular/Lymphatic: Aortic  atherosclerosis. No enlarged  abdominal or pelvic lymph nodes. Reproductive: Prostate gland is enlarged this, bulging against the posterior inferior bladder base, stable from the recent CT. Other: No abdominal wall hernia or abnormality. No abdominopelvic ascites. Musculoskeletal: No fracture or acute finding. No osteoblastic or osteolytic lesions. Stable changes from a previous L3-L4 posterior lumbar spine fusion. Chronic bilateral pars defects with a grade 1 anterolisthesis at L5-S1. IMPRESSION: 1. Moderate left hydronephrosis. There is residual contrast filling of the left intrarenal collecting system and portions of the left ureter. This is consistent with moderate obstructive uropathy, which appears due to an area of irregular bladder wall thickening or mass crossing the left ureteral trigone. 2. Mild right hydronephrosis. Right hydronephrosis has increased from the previous CT scan. Left hydronephrosis is unchanged. There is left perinephric and peri ureteral stranding, which is similar to the prior exam which may be the result of the obstruction only, but infection should also be considered. 3. Small right and minimal left pleural effusions. Mild lung base interstitial thickening. Consider mild congestive heart failure given the symptoms of shortness of breath. Electronically Signed   By: Lajean Manes M.D.   On: 11/28/2017 16:18   Dg Chest Port 1 View  Result Date: 11/28/2017 CLINICAL DATA:  Per order- SOBHis chronic constipation has worsened over the past several days. He has also felt very poorly, unable to get out of bed today and low-grade fevers. Pt states Sob today. EXAM: PORTABLE CHEST 1 VIEW COMPARISON:  None. FINDINGS: There changes from prior cardiac surgery. The cardiac silhouette is normal in size and configuration. No mediastinal or hilar masses. No convincing adenopathy. Clear lungs.  No pleural effusion or pneumothorax. Skeletal structures are intact. IMPRESSION: No active disease.  Electronically Signed   By: Lajean Manes M.D.   On: 11/28/2017 15:59   I have discussed his case with Dr. Lita Mains.  I have reviewed his labs as well as the CT films and report.     Assessment: Fever with left flank pain and malignant obstruction of the left kidney from an invasive urothelial carcinoma.   He needs a left percutaneous nephrostomy tube and admission to medicine for antibiotic therapy.    Ileus probably related to #1.         Irine Seal 11/28/2017 [phone]

## 2017-11-28 NOTE — ED Provider Notes (Signed)
[facility] Provider Note   CSN: [id number] Arrival date & time: 11/28/17  1432     History   Chief Complaint Chief Complaint  Patient presents with  . Fever    HPI Charles Coffey is a 82 y.o. male.  HPI Patient with a bladder mass who underwent cystoscopy and resection of bladder tumor on 11/29 presents with confusion, fever to 101, ongoing constipation, nausea and mild shortness of breath.  He continues to have some lower abdominal discomfort and urinary hesitancy.  He also notices some blood in the urine. Past Medical History:  Diagnosis Date  . Aortic atherosclerosis ([place])   . BPH with urinary obstruction   . CAD (coronary artery disease)    a.  MI 1995, CABG x 3 2002 (patient says that he had LIMA and RIMA grafts). b. ETT-Cardiolite (10/15) with EF 48%, apical scar, no ischemia. c. Nuc 07/2017 abnormal -> cath was performed,  patent LIMA to LAD and SVG to OM 2. RIMA to RCA is atretic. The right coronary artery is occluded distally with extensive collaterals from the LAD, medical therapy.   . Cervical spondylosis without myelopathy 06/23/2016  . Chronic low back pain    sees Dr. Kary Kos   . GERD (gastroesophageal reflux disease)   . Hiatal hernia   . Hyperlipidemia   . Hypertension   . IBS (irritable bowel syndrome)   . Ischemic cardiomyopathy   . Myocardial infarction ([place])   . RBBB   . Restless legs syndrome   . Statin intolerance   . Syncope    a. in 2015 - no apparent cause, was taking sleep medicine at the time. Cardiac workup unremarkable.  . Tubular adenoma of colon     Patient Active Problem List   Diagnosis Date Noted  . Low vitamin B12 level 11/30/2017  . Irritable bowel syndrome with constipation   . Pyelonephritis 11/28/2017  . Fever 11/28/2017  . Ileus ([place]) 11/28/2017  . Elevated troponin 11/28/2017  . Urothelial carcinoma of bladder ([place]) 11/28/2017  . Dehydration 11/28/2017  . Thrombocytopenia ([place])  11/28/2017  . Hydronephrosis due to obstructive malignant neoplasm of bladder ([place]) 11/25/2017  . Anemia 09/20/2017  . Stage 3 chronic kidney disease ([place]) 09/20/2017  . Angina pectoris ([place])   . Abnormal stress test   . Cervical spondylosis without myelopathy 006/27/2017  . PAD (peripheral artery disease) ([place]) 03/04/2015  . Arteriosclerosis of coronary artery 11/01/2014  . Acid reflux 11/01/2014  . H/O male genital system disorder 11/01/2014  . Restless leg 11/01/2014  . Acne erythematosa 11/01/2014  . Syncope 09/24/2014  . Chronic low back pain 11/10/2013  . Cardiomyopathy, ischemic 11/29/2012  . Hyperlipidemia LDL goal <70 10/15/2010  . RESTLESS LEGS SYNDROME 10/15/2010  . Essential hypertension 10/15/2010  . MYOCARDIAL INFARCTION, HX OF 10/15/2010  . Coronary artery disease of native heart with stable angina pectoris ([place]) 10/15/2010  . GERD 10/15/2010  . BENIGN PROSTATIC HYPERTROPHY 10/15/2010  . BENIGN PROSTATIC HYPERTROPHY, WITH OBSTRUCTION 10/15/2010  . COLONIC POLYPS, HX OF 10/15/2010    Past Surgical History:  Procedure Laterality Date  . COLONOSCOPY  10/17/2014   per Dr. Hilarie Fredrickson, tubular adenomas, repeat in 3 yrs   . CYSTOSCOPY W/ RETROGRADES Left 11/25/2017   Procedure: CYSTOSCOPY WITH RETROGRADE PYELOGRAM;  Surgeon: Irine Seal, MD;  Location: [facility];  Service: Urology;  Laterality: Left;  . HEART BYPASS    . IR NEPHROSTOMY PLACEMENT LEFT  11/28/2017  . LEFT HEART  CATH AND CORS/GRAFTS ANGIOGRAPHY N/A 08/25/2017   Procedure: LEFT HEART CATH AND CORS/GRAFTS ANGIOGRAPHY;  Surgeon: Wellington Hampshire, MD;  Location: [facility];  Service: Cardiovascular;  Laterality: N/A;  . LUMBAR FUSION  2003   L3-L4  . PROSTATE SURGERY  06-27-12   per Dr. Roni Bread, had CTT  . TONSILLECTOMY    . TRANSURETHRAL RESECTION OF BLADDER TUMOR N/A 11/25/2017   Procedure: TRANSURETHRAL RESECTION OF BLADDER TUMOR (TURBT);  Surgeon: Irine Seal, MD;  Location: [facility];  Service: Urology;   Laterality: N/A;       Home Medications    Prior to Admission medications   Medication Sig Start Date End Date Taking? Authorizing Provider  acetaminophen (TYLENOL) 500 MG tablet Take 1,000 mg by mouth every 8 (eight) hours as needed for mild pain or moderate pain.   Yes [provider]  aspirin 81 MG tablet Take 81 mg by mouth daily.     Yes [provider]  carvedilol (COREG) 6.25 MG tablet Take 1 tablet (6.25 mg total) 2 (two) times daily with a meal by mouth. 11/09/17  Yes Larey Dresser, MD  diphenhydramine-acetaminophen (TYLENOL PM EXTRA STRENGTH) 25-500 MG TABS tablet Take 1 tablet by mouth at bedtime as needed.    Yes [provider]  Hydrocortisone Acetate (HEMORRHOIDAL-HC RE) Place 1 application rectally as needed (hemorrhoids).   Yes [provider]  isosorbide mononitrate (IMDUR) 60 MG 24 hr tablet Take 1 tablet (60 mg total) by mouth daily. 10/28/17 01/26/18 Yes End, Harrell Gave, MD  lubiprostone (AMITIZA) 24 MCG capsule Take 1 capsule (24 mcg total) by mouth 2 (two) times daily with a meal. 11/23/17  Yes Pyrtle, Lajuan Lines, MD  magnesium citrate SOLN Take 1 Bottle by mouth once.   Yes [provider]  nitroGLYCERIN (NITROSTAT) 0.4 MG SL tablet Place 1 tablet (0.4 mg total) under the tongue every 5 (five) minutes as needed. Patient taking differently: Place 0.4 mg under the tongue every 5 (five) minutes as needed for chest pain.  08/19/17 11/28/17 Yes Dunn, Dayna N, PA-C  Plant Sterols and Stanols (CHOLESTOFF PO) Take 2 tablets by mouth 2 (two) times daily.   Yes [provider]  polyethylene glycol (MIRALAX / GLYCOLAX) packet Take 17 g by mouth daily as needed for moderate constipation.   Yes [provider]  pramipexole (MIRAPEX) 1 MG tablet TAKE 1 AND 1/2 TABLETS BY MOUTH AT BEDTIME 06/29/17  Yes [name], MD  ranitidine (ZANTAC) 300 MG tablet TAKE 1 TABLET BY MOUTH TWICE A DAY 10/04/17  Yes Pyrtle, Lajuan Lines, MD    Tetrahydroz-Glyc-Hyprom-PEG (VISINE MAXIMUM REDNESS RELIEF OP) Place 2 drops into both eyes 2 (two) times daily as needed (dry eyes).    Yes [provider]  traMADol (ULTRAM) 50 MG tablet Take 50 mg by mouth every 6 (six) hours as needed for moderate pain.    Yes [provider]  linaclotide (LINZESS) 290 MCG CAPS capsule TAKE 1 CAPSULE (290 MCG TOTAL) BY MOUTH DAILY. [place] Patient not taking: Reported on 11/24/2017 03/30/17   Pyrtle, Lajuan Lines, MD    Family History Family History  Problem Relation Age of Onset  . Heart disease Mother   . Heart attack Mother   . Aneurysm Father        femoral artery  . Heart disease Brother   . Heart disease Maternal Uncle        x 2  . Colon cancer  Neg Hx   . Esophageal cancer Neg Hx   . Pancreatic cancer Neg Hx   . Kidney disease Neg Hx   . Liver disease Neg Hx     Social History Social History   Tobacco Use  . Smoking status: Former Smoker    Types: Cigarettes    Last attempt to quit: 12/28/1978    Years since quitting: 38.9  . Smokeless tobacco: Never Used  Substance Use Topics  . Alcohol use: Yes    Alcohol/week: 0.0 oz    Comment: occ-  . Drug use: No     Allergies   Antihistamines, loratadine-type and Statins   Review of Systems Review of Systems  Constitutional: Positive for activity change, appetite change, chills, fatigue and fever.  HENT: Negative for congestion.   Eyes: Negative for visual disturbance.  Respiratory: Positive for shortness of breath. Negative for cough.   Cardiovascular: Positive for leg swelling. Negative for chest pain and palpitations.  Gastrointestinal: Positive for abdominal pain, constipation and nausea. Negative for blood in stool, diarrhea and vomiting.  Genitourinary: Positive for difficulty urinating and hematuria. Negative for dysuria and flank pain.  Musculoskeletal: Negative for back pain, myalgias, neck pain and neck stiffness.  Skin: Negative for rash  and wound.  Neurological: Positive for weakness. Negative for dizziness, light-headedness, numbness and headaches.       Generalized  Psychiatric/Behavioral: Positive for confusion.  All other systems reviewed and are negative.    Physical Exam Updated Vital Signs BP 104/83 (BP Location: Left Arm)   Pulse 60   Temp 98.2 F (36.8 C) (Oral)   Resp 20   Wt 88 kg (194 lb 0.1 oz)   SpO2 98%   BMI 29.07 kg/m   Physical Exam  Constitutional: He is oriented to person, place, and time. He appears well-developed and well-nourished. No distress.  HENT:  Head: Normocephalic and atraumatic.  Mouth/Throat: Oropharynx is clear and moist. No oropharyngeal exudate.  Eyes: EOM are normal. Pupils are equal, round, and reactive to light.  Neck: Normal range of motion. Neck supple.  Cardiovascular: Normal rate and regular rhythm. Exam reveals no gallop and no friction rub.  No murmur heard. Pulmonary/Chest: Effort normal. He has rales.  Few crackles in bilateral bases.  Abdominal: Soft. There is tenderness. There is no rebound and no guarding.  Hyperactive bowel sounds throughout.  Mild suprapubic tenderness without rebound or guarding.  Musculoskeletal: Normal range of motion. He exhibits no edema or tenderness.  No CVA tenderness bilaterally.  1+ bilateral lower extremity edema.  Neurological: He is alert and oriented to person, place, and time.  5/5 motor in all extremities.  Sensation intact.  Skin: Skin is warm and dry. No rash noted. He is not diaphoretic. No erythema.  Psychiatric: His behavior is normal.  Mild confusion  Nursing note and vitals reviewed.    ED Treatments / Results  Labs (all labs ordered are listed, but only abnormal results are displayed) Labs Reviewed  URINE CULTURE - Abnormal; Notable for the following components:      Result Value   Culture   (*)    Value: <10,000 COLONIES/mL INSIGNIFICANT GROWTH Performed at [facility], [address]  [place], [place] [address]    All other components within normal limits  CBC WITH DIFFERENTIAL/PLATELET - Abnormal; Notable for the following components:   RBC 3.01 (*)    Hemoglobin 9.4 (*)    HCT 27.5 (*)    RDW 16.4 (*)  Platelets 72 (*)    Monocytes Absolute 1.1 (*)    All other components within normal limits  COMPREHENSIVE METABOLIC PANEL - Abnormal; Notable for the following components:   Sodium 133 (*)    CO2 20 (*)    Glucose, Bld 100 (*)    BUN 24 (*)    Creatinine, Ser 1.56 (*)    GFR calc non Af Amer 40 (*)    GFR calc Af Amer 47 (*)    All other components within normal limits  URINALYSIS, ROUTINE W REFLEX MICROSCOPIC - Abnormal; Notable for the following components:   Hgb urine dipstick LARGE (*)    Protein, ur 30 (*)    All other components within normal limits  TROPONIN I - Abnormal; Notable for the following components:   Troponin I 0.20 (*)    All other components within normal limits  TROPONIN I - Abnormal; Notable for the following components:   Troponin I 0.17 (*)    All other components within normal limits  TROPONIN I - Abnormal; Notable for the following components:   Troponin I 0.13 (*)    All other components within normal limits  IRON AND TIBC - Abnormal; Notable for the following components:   Iron 27 (*)    Saturation Ratios 10 (*)    All other components within normal limits  RETICULOCYTES - Abnormal; Notable for the following components:   RBC. 2.60 (*)    All other components within normal limits  MAGNESIUM - Abnormal; Notable for the following components:   Magnesium 2.7 (*)    All other components within normal limits  BASIC METABOLIC PANEL - Abnormal; Notable for the following components:   Sodium 134 (*)    CO2 21 (*)    BUN 26 (*)    Creatinine, Ser 1.55 (*)    Calcium 8.5 (*)    GFR calc non Af Amer 41 (*)    GFR calc Af Amer 47 (*)    All other components within normal limits  CBC - Abnormal; Notable for the following components:     RBC 2.68 (*)    Hemoglobin 8.1 (*)    HCT 24.3 (*)    RDW 16.4 (*)    Platelets 59 (*)    All other components within normal limits  LACTATE DEHYDROGENASE - Abnormal; Notable for the following components:   LDH 210 (*)    All other components within normal limits  HAPTOGLOBIN - Abnormal; Notable for the following components:   Haptoglobin 233 (*)    All other components within normal limits  CBC - Abnormal; Notable for the following components:   RBC 3.00 (*)    Hemoglobin 9.2 (*)    HCT 27.1 (*)    RDW 16.1 (*)    Platelets 60 (*)    All other components within normal limits  BASIC METABOLIC PANEL - Abnormal; Notable for the following components:   Sodium 132 (*)    Glucose, Bld 114 (*)    BUN 24 (*)    Creatinine, Ser 1.49 (*)    GFR calc non Af Amer 43 (*)    GFR calc Af Amer 49 (*)    All other components within normal limits  GLUCOSE, CAPILLARY - Abnormal; Notable for the following components:   Glucose-Capillary 107 (*)    All other components within normal limits  I-STAT TROPONIN, ED - Abnormal; Notable for the following components:   Troponin i, poc 0.14 (*)    All  other components within normal limits  CULTURE, BLOOD (ROUTINE X 2)  CULTURE, BLOOD (ROUTINE X 2)  URINE CULTURE  MAGNESIUM  VITAMIN B12  FOLATE  FERRITIN  SAVE SMEAR  PATHOLOGIST SMEAR REVIEW  CBC WITH DIFFERENTIAL/PLATELET  BASIC METABOLIC PANEL  I-STAT CG4 LACTIC ACID, ED    EKG  EKG Interpretation  Date/Time:  Sunday November 28 2017 15:31:15 EST Ventricular Rate:  79 PR Interval:    QRS Duration: 160 QT Interval:  381 QTC Calculation: 437 R Axis:   -73 Text Interpretation:  Sinus rhythm Prolonged PR interval RBBB and LAFB Baseline wander in lead(s) III aVF Confirmed by Julianne Rice [phone]) on 11/28/2017 3:34:40 PM Also confirmed by Julianne Rice [phone]), editor Hattie Perch ([id number])  on 11/29/2017 6:55:56 AM       Radiology Dg Abd 2 Views  Result Date:  11/30/2017 CLINICAL DATA:  Abdominal bloating, rectal pain with bowel movements. EXAM: ABDOMEN - 2 VIEW COMPARISON:  Abdominal radiographs of November 29, 2017 FINDINGS: There remain multiple air in fluid levels within loops of colon in the mid and upper abdomen bilaterally. There are few small air-fluid levels in the mid abdomen. No significant rectal gas is observed. No free extraluminal gas collections are demonstrated. A left nephrostomy tube appears stable. There are postsurgical changes in the lower lumbar spine. There is stable moderate dextrocurvature centered at L2-3. IMPRESSION: There has been distal migration of small bowel gas into the colon. No evidence of obstruction is evident. There is no significant rectal gas however. There is no evidence of perforation. Electronically Signed   By: Uriyah Raska  [place] M.D.   On: 11/30/2017 14:49   Acute Abdominal Series  Result Date: 11/29/2017 CLINICAL DATA:  Initial evaluation for constipation for 1 week. EXAM: DG ABDOMEN ACUTE W/ 1V CHEST COMPARISON:  Prior CT from 11/28/2017. FINDINGS: Median sternotomy wires underlying cardiomegaly. Mediastinal silhouette normal. Lungs hypoinflated. Mild diffuse pulmonary interstitial congestion without frank alveolar edema. No other focal infiltrates. No pleural effusion. No pneumothorax. Few scattered gas-filled loops of bowel seen throughout the abdomen without evidence for obstruction or ileus. No free air seen on lateral decubitus view. No significant bowel wall thickening. Overall stool burden is relatively mild. Soft tissue mass or abnormal calcification. Left-sided nephrostomy tube noted. Dextroscoliosis with postsurgical changes noted within the lower lumbar spine. IMPRESSION: 1. Nonobstructive bowel gas pattern with no radiographic evidence for acute intra-abdominal pathology. Overall stool burden is mild. 2. Percutaneous nephrostomy tube in place. 3. Stable cardiomegaly with mild diffuse pulmonary interstitial  congestion without frank pulmonary edema. Electronically Signed   By: Jeannine Boga M.D.   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 Status post retrograde urography on 11/25/2017. At that time, contrast injection was performed but a ureteral stent could not be advanced to the level of the kidney. He now presents with increasing flank and back pain as well as fever with evidence of developing urosepsis. By CT, there is retained contrast material in the left renal collecting system that demonstrates significant hydronephrosis. EXAM: LEFT PERCUTANEOUS NEPHROSTOMY TUBE PLACEMENT. COMPARISON:  CT of the abdomen on 11/28/2017 and imaging from retrograde urography study on 11/25/2017 ANESTHESIA/SEDATION: 1.0 mg IV Versed; 50 mcg IV Fentanyl. Total Moderate Sedation Time 6 minutes. The patient's level of consciousness and physiologic status were continuously monitored during the procedure by Radiology nursing. CONTRAST:  15 mL  Isovue-300 injected into the renal collecting system. MEDICATIONS: IV vancomycin was being infused at the time of the procedure. FLUOROSCOPY TIME:  1 minute and 42 seconds.  72 mGy. PROCEDURE: The procedure, risks, benefits, and alternatives were explained to the patient. Questions regarding the procedure were encouraged and answered. The patient understands and consents to the procedure. A time-out was performed prior to initiating the procedure. The left flank region was prepped with chlorhexidine in a sterile fashion, and a sterile drape was applied covering the operative field. A sterile gown and sterile gloves were used for the procedure. Local anesthesia was provided with 1% Lidocaine. Under fluoroscopy, a 21 gauge needle was advanced into the renal collecting system via a posterior lower pole  calyx. Aspiration of urine sample was performed followed by contrast injection. A guidewire was advanced. A transitional dilator was advanced over the guidewire. Percutaneous tract dilatation was then performed over the guidewire. A 10 -French percutaneous nephrostomy tube was then advanced and formed in the collecting system. Catheter position was confirmed by fluoroscopy after contrast injection. The catheter was secured at the skin with a Prolene retention suture and Stat-Lock device. A urine sample was sent for culture analysis. A gravity bag was placed. COMPLICATIONS: None. FINDINGS: Severe hydronephrosis is present with retained contrast material in the left renal collecting system. After access of the lower pole, a nephrostomy tube was eventually placed and advanced into the renal pelvis. IMPRESSION: Placement of 10 French left percutaneous nephrostomy tube. A urine sample was sent for culture analysis. Electronically Signed   By: Aletta Edouard M.D.   On: 11/30/2017 09:20    Procedures Procedures (including critical care time)  Medications Ordered in ED Medications  cefTRIAXone (ROCEPHIN) 2 g in dextrose 5 % 50 mL IVPB (0 g Intravenous Stopped 11/30/17 0044)  hydrocortisone (ANUSOL-HC) suppository 25 mg (not administered)  nitroGLYCERIN (NITROSTAT) SL tablet 0.4 mg (not administered)  isosorbide mononitrate (IMDUR) 24 hr tablet 60 mg (60 mg Oral Given 11/30/17 0903)  pramipexole (MIRAPEX) tablet 0.25 mg (0.25 mg Oral Given 11/30/17 1517)  acetaminophen (TYLENOL) tablet 650 mg (not administered)    Or  acetaminophen (TYLENOL) suppository 650 mg (not administered)  oxyCODONE (Oxy IR/ROXICODONE) immediate release tablet 5 mg (not administered)  bisacodyl (DULCOLAX) suppository 10 mg (not administered)  ondansetron (ZOFRAN) tablet 4 mg (not administered)    Or  ondansetron (ZOFRAN) injection 4 mg (not administered)  albuterol (PROVENTIL) (2.5 MG/3ML) 0.083% nebulizer solution 2.5 mg (not  administered)  sodium chloride flush (NS) 0.9 % injection 5 mL (5 mLs Intravenous Given 11/30/17 1126)  morphine 4 MG/ML injection 2 mg (2 mg Intravenous Given 11/30/17 1004)  carvedilol (COREG) tablet 6.25 mg (6.25 mg Oral Given 11/30/17 1647)  famotidine (PEPCID) tablet 20 mg (20 mg Oral Given 11/30/17 0904)  lubiprostone (AMITIZA) capsule 24 mcg (24 mcg Oral Given 11/30/17 1648)  polyethylene glycol (MIRALAX / GLYCOLAX) packet 17 g (17 g Oral Not Given 11/30/17 0943)  cyanocobalamin ((VITAMIN B-12)) injection 1,000 mcg (1,000 mcg Intramuscular Given 11/30/17 1517)  peg 3350 powder (MOVIPREP) kit 100 g (100 g Oral Given 11/30/17 1518)    And  peg 3350 powder (MOVIPREP) kit 100 g (not administered)  piperacillin-tazobactam (ZOSYN) IVPB 3.375 g (0 g Intravenous Stopped 11/28/17 1902)  vancomycin (VANCOCIN) IVPB 1000 mg/200 mL premix (0 mg Intravenous Stopped 11/28/17 2044)  lidocaine (XYLOCAINE) 1 % (with pres) injection (  Given by Other 11/28/17 1930)  midazolam (VERSED) 2 MG/2ML injection (  Given by Other 11/28/17  1945)  fentaNYL (SUBLIMAZE) 100 MCG/2ML injection (  Given by Other 11/28/17 1945)  midazolam (VERSED) injection (1 mg Intravenous Given 11/28/17 2000)  fentaNYL (SUBLIMAZE) injection (25 mcg Intravenous Given 11/28/17 2006)  lidocaine (XYLOCAINE) 1 % (with pres) injection (10 mLs  Given 11/28/17 2003)  iopamidol (ISOVUE-300) 61 % injection 50 mL (15 mLs Other Contrast Given 11/28/17 2017)  lactated ringers bolus 500 mL (0 mLs Intravenous Stopped 11/29/17 0026)  bisacodyl (DULCOLAX) suppository 10 mg (10 mg Rectal Given 11/29/17 0928)  sorbitol 70 % solution 30 mL (30 mLs Oral Given 11/29/17 1338)     Initial Impression / Assessment and Plan / ED Course  I have reviewed the triage vital signs and the nursing notes.  Pertinent labs & imaging results that were available during my care of the patient were reviewed by me and considered in my medical decision making (see chart for details).     Discussed with Dr. [name].  Recommends starting broad-spectrum IV antibiotics once urine cultures have been obtained.  Will likely need left-sided nephrostomy tube.  Recommended medicine for admission.  Dr. Grandville Silos, tried hospitalist will admit.   Final Clinical Impressions(s) / ED Diagnoses   Final diagnoses:  Ileus [facility])    ED Discharge Orders    None       Julianne Rice, MD 11/30/17 1758

## 2017-11-28 NOTE — Procedures (Signed)
Interventional Radiology Procedure Note  Procedure: Left percutaneous nephrostomy  Complications: None  Estimated Blood Loss: < 10 mL  Recommendations: 10 Fr PCN placed via LP puncture.  Formed in renal pelvis.  Urine sample sent for culture.  Venetia Night. Kathlene Cote, M.D Pager:  [phone]

## 2017-11-29 ENCOUNTER — Inpatient Hospital Stay (HOSPITAL_COMMUNITY): Payer: Medicare HMO

## 2017-11-29 DIAGNOSIS — R319 Hematuria, unspecified: Secondary | ICD-10-CM

## 2017-11-29 DIAGNOSIS — N289 Disorder of kidney and ureter, unspecified: Secondary | ICD-10-CM

## 2017-11-29 DIAGNOSIS — Z936 Other artificial openings of urinary tract status: Secondary | ICD-10-CM

## 2017-11-29 DIAGNOSIS — I255 Ischemic cardiomyopathy: Secondary | ICD-10-CM

## 2017-11-29 DIAGNOSIS — D649 Anemia, unspecified: Secondary | ICD-10-CM

## 2017-11-29 DIAGNOSIS — N1330 Unspecified hydronephrosis: Secondary | ICD-10-CM

## 2017-11-29 DIAGNOSIS — D696 Thrombocytopenia, unspecified: Secondary | ICD-10-CM

## 2017-11-29 LAB — CBC
HEMATOCRIT: 24.3 % — AB (ref 39.0–52.0)
Hemoglobin: 8.1 g/dL — ABNORMAL LOW (ref 13.0–17.0)
MCH: 30.2 pg (ref 26.0–34.0)
MCHC: 33.3 g/dL (ref 30.0–36.0)
MCV: 90.7 fL (ref 78.0–100.0)
Platelets: 59 10*3/uL — ABNORMAL LOW (ref 150–400)
RBC: 2.68 MIL/uL — ABNORMAL LOW (ref 4.22–5.81)
RDW: 16.4 % — ABNORMAL HIGH (ref 11.5–15.5)
WBC: 6.8 10*3/uL (ref 4.0–10.5)

## 2017-11-29 LAB — TROPONIN I
Troponin I: 0.17 ng/mL (ref <0.03)
Troponin I: 0.13 ng/mL (ref <0.03)

## 2017-11-29 LAB — BASIC METABOLIC PANEL
ANION GAP: 8 (ref 5–15)
BUN: 26 mg/dL — ABNORMAL HIGH (ref 6–20)
CHLORIDE: 105 mmol/L (ref 101–111)
CO2: 21 mmol/L — ABNORMAL LOW (ref 22–32)
Calcium: 8.5 mg/dL — ABNORMAL LOW (ref 8.9–10.3)
Creatinine, Ser: 1.55 mg/dL — ABNORMAL HIGH (ref 0.61–1.24)
GFR calc Af Amer: 47 mL/min — ABNORMAL LOW (ref >60)
GFR, EST NON AFRICAN AMERICAN: 41 mL/min — AB (ref >60)
GLUCOSE: 98 mg/dL (ref 65–99)
POTASSIUM: 4.4 mmol/L (ref 3.5–5.1)
Sodium: 134 mmol/L — ABNORMAL LOW (ref 135–145)

## 2017-11-29 LAB — FERRITIN: Ferritin: 185 ng/mL (ref 24–336)

## 2017-11-29 LAB — IRON AND TIBC
IRON: 27 ug/dL — AB (ref 45–182)
SATURATION RATIOS: 10 % — AB (ref 17.9–39.5)
TIBC: 274 ug/dL (ref 250–450)
UIBC: 247 ug/dL

## 2017-11-29 LAB — LACTATE DEHYDROGENASE: LDH: 210 U/L — ABNORMAL HIGH (ref 98–192)

## 2017-11-29 LAB — MAGNESIUM
MAGNESIUM: 2.7 mg/dL — AB (ref 1.7–2.4)
Magnesium: 2.3 mg/dL (ref 1.7–2.4)

## 2017-11-29 LAB — SAVE SMEAR

## 2017-11-29 LAB — VITAMIN B12: VITAMIN B 12: 474 pg/mL (ref 180–914)

## 2017-11-29 LAB — RETICULOCYTES
RBC.: 2.6 MIL/uL — ABNORMAL LOW (ref 4.22–5.81)
Retic Count, Absolute: 52 10*3/uL (ref 19.0–186.0)
Retic Ct Pct: 2 % (ref 0.4–3.1)

## 2017-11-29 LAB — PATHOLOGIST SMEAR REVIEW

## 2017-11-29 LAB — FOLATE: FOLATE: 15.8 ng/mL (ref >5.9)

## 2017-11-29 MED ORDER — CARVEDILOL 6.25 MG PO TABS
6.2500 mg | ORAL_TABLET | Freq: Two times a day (BID) | ORAL | Status: DC
  Administered 2017-11-30 – 2017-12-04 (×5): 6.25 mg via ORAL
  Filled 2017-11-29 (×8): qty 1

## 2017-11-29 MED ORDER — LUBIPROSTONE 24 MCG PO CAPS
24.0000 ug | ORAL_CAPSULE | Freq: Two times a day (BID) | ORAL | Status: DC
  Administered 2017-11-29 – 2017-12-01 (×4): 24 ug via ORAL
  Filled 2017-11-29 (×5): qty 1

## 2017-11-29 MED ORDER — LUBIPROSTONE 24 MCG PO CAPS
24.0000 ug | ORAL_CAPSULE | Freq: Two times a day (BID) | ORAL | Status: DC
  Filled 2017-11-29: qty 1

## 2017-11-29 MED ORDER — TRAMADOL HCL 50 MG PO TABS: 50.0000 mg | ORAL_TABLET | Freq: Four times a day (QID) | ORAL | Status: DC | PRN

## 2017-11-29 MED ORDER — FAMOTIDINE 20 MG PO TABS
20.0000 mg | ORAL_TABLET | Freq: Two times a day (BID) | ORAL | Status: DC
  Administered 2017-11-29 – 2017-12-01 (×4): 20 mg via ORAL
  Filled 2017-11-29 (×4): qty 1

## 2017-11-29 MED ORDER — MORPHINE SULFATE (PF) 4 MG/ML IV SOLN
2.0000 mg | INTRAVENOUS | Status: DC | PRN
  Administered 2017-11-30 – 2017-12-01 (×4): 2 mg via INTRAVENOUS
  Filled 2017-11-29 (×4): qty 1

## 2017-11-29 MED ORDER — BISACODYL 10 MG RE SUPP
10.0000 mg | Freq: Once | RECTAL | Status: AC
  Administered 2017-11-29: 10 mg via RECTAL
  Filled 2017-11-29: qty 1

## 2017-11-29 MED ORDER — SORBITOL 70 % SOLN
30.0000 mL | Freq: Once | Status: AC
  Administered 2017-11-29: 30 mL via ORAL
  Filled 2017-11-29: qty 30

## 2017-11-29 NOTE — ED Notes (Signed)
ED TO INPATIENT HANDOFF REPORT  Name/Age/Gender Charles Coffey 82 y.o. male  Code Status    Code Status Orders  (From admission, onward)        Start     Ordered   11/28/17 2137  Full code  Continuous     11/28/17 2136    Code Status History    Date Active Date Inactive Code Status Order ID Comments User Context   11/25/2017 15:10 11/26/2017 16:08 Full Code [id number]  Irine Seal, MD Inpatient   08/25/2017 18:45 08/26/2017 19:06 Full Code [id number]  Wellington Hampshire, MD Inpatient    Advance Directive Documentation     Most Recent Value  Type of Advance Directive  Healthcare Power of Attorney, Living will  Pre-existing out of facility DNR order (yellow form or pink MOST form)  No data  "MOST" Form in Place?  No data      Home/SNF/Other   Chief Complaint fever/nausea-sent by doctor  Level of Care/Admitting Diagnosis ED Disposition    ED Disposition Condition [facility] Area: [place] [[id number]]  Level of Care: Telemetry [5]  Admit to tele based on following criteria: Other see comments  Comments: PYELONEPHRITIS/FEVERS  Diagnosis: Pyelonephritis [[id number]]  Admitting Physician: Eugenie Filler [[id number]]  Attending Physician: Eugenie Filler [[id number]]  Estimated length of stay: past midnight tomorrow  Certification:: I certify this patient will need inpatient services for at least 2 midnights  PT Class (Do Not Modify): Inpatient [101]  PT Acc Code (Do Not Modify): Private [1]       Medical History Past Medical History:  Diagnosis Date  . Aortic atherosclerosis ([place])   . BPH with urinary obstruction   . CAD (coronary artery disease)    a.  MI 1995, CABG x 3 2002 (patient says that he had LIMA and RIMA grafts). b. ETT-Cardiolite (10/15) with EF 48%, apical scar, no ischemia. c. Nuc 07/2017 abnormal -> cath was performed,  patent LIMA to LAD and SVG to OM 2. RIMA to RCA is atretic. The right coronary artery is occluded distally  with extensive collaterals from the LAD, medical therapy.   . Cervical spondylosis without myelopathy 2020-08-1916  . Chronic low back pain    sees Dr. Kary Kos   . GERD (gastroesophageal reflux disease)   . Hiatal hernia   . Hyperlipidemia   . Hypertension   . IBS (irritable bowel syndrome)   . Ischemic cardiomyopathy   . Myocardial infarction ([place])   . RBBB   . Restless legs syndrome   . Statin intolerance   . Syncope    a. in 2015 - no apparent cause, was taking sleep medicine at the time. Cardiac workup unremarkable.  . Tubular adenoma of colon     Allergies Allergies  Allergen Reactions  . Antihistamines, Loratadine-Type Other (See Comments)    Unable to urinate  . Statins Other (See Comments)    liver effects    IV Location/Drains/Wounds Patient Lines/Drains/Airways Status   Active Line/Drains/Airways    Name:   Placement date:   Placement time:   Site:   Days:   Peripheral IV 11/28/17 Left Forearm   11/28/17    1535    Forearm   1   Peripheral IV 11/29/17 Left Arm   11/29/17    0455    Arm   less than 1   Nephrostomy Left 10 Fr.   11/28/17    2007    Left  1   Incision (Closed) 11/25/17 Penis   11/25/17    1339     4          Labs/Imaging Results for orders placed or performed during the hospital encounter of 11/28/17 (from the past 48 hour(s))  CBC with Differential/Platelet     Status: Abnormal   Collection Time: 11/28/17  3:18 PM  Result Value Ref Range   WBC 9.9 4.0 - 10.5 K/uL   RBC 3.01 (L) 4.22 - 5.81 MIL/uL   Hemoglobin 9.4 (L) 13.0 - 17.0 g/dL   HCT 27.5 (L) 39.0 - 52.0 %   MCV 91.4 78.0 - 100.0 fL   MCH 31.2 26.0 - 34.0 pg   MCHC 34.2 30.0 - 36.0 g/dL   RDW 16.4 (H) 11.5 - 15.5 %   Platelets 72 (L) 150 - 400 K/uL    Comment: REPEATED TO VERIFY SPECIMEN CHECKED FOR CLOTS PLATELET COUNT CONFIRMED BY SMEAR    Neutrophils Relative % 70 %   Lymphocytes Relative 18 %   Monocytes Relative 11 %   Eosinophils Relative 1 %   Basophils Relative 0 %    Neutro Abs 6.9 1.7 - 7.7 K/uL   Lymphs Abs 1.8 0.7 - 4.0 K/uL   Monocytes Absolute 1.1 (H) 0.1 - 1.0 K/uL   Eosinophils Absolute 0.1 0.0 - 0.7 K/uL   Basophils Absolute 0.0 0.0 - 0.1 K/uL   RBC Morphology ELLIPTOCYTES   Comprehensive metabolic panel     Status: Abnormal   Collection Time: 11/28/17  3:18 PM  Result Value Ref Range   Sodium 133 (L) 135 - 145 mmol/L   Potassium 4.3 3.5 - 5.1 mmol/L   Chloride 104 101 - 111 mmol/L   CO2 20 (L) 22 - 32 mmol/L   Glucose, Bld 100 (H) 65 - 99 mg/dL   BUN 24 (H) 6 - 20 mg/dL   Creatinine, Ser 1.56 (H) 0.61 - 1.24 mg/dL   Calcium 9.1 8.9 - 10.3 mg/dL   Total Protein 6.7 6.5 - 8.1 g/dL   Albumin 4.0 3.5 - 5.0 g/dL   AST 23 15 - 41 U/L   ALT 18 17 - 63 U/L   Alkaline Phosphatase 51 38 - 126 U/L   Total Bilirubin 1.1 0.3 - 1.2 mg/dL   GFR calc non Af Amer 40 (L) >60 mL/min   GFR calc Af Amer 47 (L) >60 mL/min    Comment: (NOTE) The eGFR has been calculated using the CKD EPI equation. This calculation has not been validated in all clinical situations. eGFR's persistently <60 mL/min signify possible Chronic Kidney Disease.    Anion gap 9 5 - 15  Save smear     Status: None   Collection Time: 11/28/17  3:18 PM  Result Value Ref Range   Smear Review SMEAR STAINED AND AVAILABLE FOR REVIEW   I-stat troponin, ED     Status: Abnormal   Collection Time: 11/28/17  3:36 PM  Result Value Ref Range   Troponin i, poc 0.14 (HH) 0.00 - 0.08 ng/mL   Comment NOTIFIED PHYSICIAN    Comment 3            Comment: Due to the release kinetics of cTnI, a negative result within the first hours of the onset of symptoms does not rule out myocardial infarction with certainty. If myocardial infarction is still suspected, repeat the test at appropriate intervals.   I-Stat CG4 Lactic Acid, ED     Status: None   Collection  Time: 11/28/17  3:38 PM  Result Value Ref Range   Lactic Acid, Venous 0.72 0.5 - 1.9 mmol/L  Urinalysis, Routine w reflex microscopic      Status: Abnormal   Collection Time: 11/28/17  6:18 PM  Result Value Ref Range   Color, Urine YELLOW YELLOW   APPearance CLEAR CLEAR   Specific Gravity, Urine 1.015 1.005 - 1.030   pH 6.0 5.0 - 8.0   Glucose, UA NEGATIVE NEGATIVE mg/dL   Hgb urine dipstick LARGE (A) NEGATIVE   Bilirubin Urine NEGATIVE NEGATIVE   Ketones, ur NEGATIVE NEGATIVE mg/dL   Protein, ur 30 (A) NEGATIVE mg/dL   Nitrite NEGATIVE NEGATIVE   Leukocytes, UA NEGATIVE NEGATIVE   RBC / HPF TOO NUMEROUS TO COUNT 0 - 5 RBC/hpf   WBC, UA 0-5 0 - 5 WBC/hpf   Bacteria, UA NONE SEEN NONE SEEN   Squamous Epithelial / LPF NONE SEEN NONE SEEN  Troponin I (q 6hr x 3)     Status: Abnormal   Collection Time: 11/28/17  7:22 PM  Result Value Ref Range   Troponin I 0.20 (HH) <0.03 ng/mL    Comment: CRITICAL RESULT CALLED TO, READ BACK BY AND VERIFIED WITH: I COGGINS,RN _0  11/28/17 MKELLY   Troponin I (q 6hr x 3)     Status: Abnormal   Collection Time: 11/29/17  1:12 AM  Result Value Ref Range   Troponin I 0.17 (HH) <0.03 ng/mL    Comment: CRITICAL VALUE NOTED.  VALUE IS CONSISTENT WITH PREVIOUSLY REPORTED AND CALLED VALUE.  Magnesium     Status: None   Collection Time: 11/29/17  5:40 AM  Result Value Ref Range   Magnesium 2.3 1.7 - 2.4 mg/dL  Reticulocytes     Status: Abnormal   Collection Time: 11/29/17  5:40 AM  Result Value Ref Range   Retic Ct Pct 2.0 0.4 - 3.1 %   RBC. 2.60 (L) 4.22 - 5.81 MIL/uL   Retic Count, Absolute 52.0 19.0 - 186.0 K/uL  Basic metabolic panel     Status: Abnormal   Collection Time: 11/29/17  5:40 AM  Result Value Ref Range   Sodium 134 (L) 135 - 145 mmol/L   Potassium 4.4 3.5 - 5.1 mmol/L   Chloride 105 101 - 111 mmol/L   CO2 21 (L) 22 - 32 mmol/L   Glucose, Bld 98 65 - 99 mg/dL   BUN 26 (H) 6 - 20 mg/dL   Creatinine, Ser 1.55 (H) 0.61 - 1.24 mg/dL   Calcium 8.5 (L) 8.9 - 10.3 mg/dL   GFR calc non Af Amer 41 (L) >60 mL/min   GFR calc Af Amer 47 (L) >60 mL/min    Comment:  (NOTE) The eGFR has been calculated using the CKD EPI equation. This calculation has not been validated in all clinical situations. eGFR's persistently <60 mL/min signify possible Chronic Kidney Disease.    Anion gap 8 5 - 15  CBC     Status: Abnormal   Collection Time: 11/29/17  5:40 AM  Result Value Ref Range   WBC 6.8 4.0 - 10.5 K/uL   RBC 2.68 (L) 4.22 - 5.81 MIL/uL   Hemoglobin 8.1 (L) 13.0 - 17.0 g/dL   HCT 24.3 (L) 39.0 - 52.0 %   MCV 90.7 78.0 - 100.0 fL   MCH 30.2 26.0 - 34.0 pg   MCHC 33.3 30.0 - 36.0 g/dL   RDW 16.4 (H) 11.5 - 15.5 %   Platelets 59 (L) 150 - 400  K/uL    Comment: REPEATED TO VERIFY SPECIMEN CHECKED FOR CLOTS PLATELET COUNT CONFIRMED BY SMEAR    Ct Abdomen Pelvis Wo Contrast  Result Date: 11/28/2017 CLINICAL DATA:  Family reports pt had renal and bladder biopsies on Thursday or Friday. Family reports pt has had a fever at home, nausea, dysuria, and AMS. Denies any antipyretic use today. [name]. EXAM: CT ABDOMEN AND PELVIS WITHOUT CONTRAST TECHNIQUE: Multidetector CT imaging of the abdomen and pelvis was performed following the standard protocol without IV contrast. COMPARISON:  11/11/2017 FINDINGS: Lower chest: Small right and minimal left pleural effusions. Pleural effusions are new since the prior CT. Mild interstitial thickening at the lung bases as well as subsegmental atelectasis, greater on the right. Interstitial thickening is similar to the prior CT. No convincing pneumonia. Hepatobiliary: No focal liver abnormality is seen. No gallstones, gallbladder wall thickening, or biliary dilatation. Pancreas: Unremarkable. No pancreatic ductal dilatation or surrounding inflammatory changes. Spleen: Normal in size without focal abnormality. Adrenals/Urinary Tract: Residual contrast fills the moderately dilated left intrarenal collecting system and portions of the left ureter. There is mild dilation of the right intrarenal  collecting system and portions of the right ureter. There is diffuse left renal cortical thinning. No renal masses. No visualized stones. No adrenal masses. Irregular thickening along the posterior aspect of the bladder, greater on the left, is similar to the prior CT. Stomach/Bowel: Stomach is unremarkable. The small bowel and colon show air-fluid levels, but are nondilated with no wall thickening or adjacent inflammation. This is consistent with a mild diffuse adynamic ileus. No evidence of obstruction. Mild colonic stool burden. Appendix not visualized. Vascular/Lymphatic: Aortic atherosclerosis. No enlarged abdominal or pelvic lymph nodes. Reproductive: Prostate gland is enlarged this, bulging against the posterior inferior bladder base, stable from the recent CT. Other: No abdominal wall hernia or abnormality. No abdominopelvic ascites. Musculoskeletal: No fracture or acute finding. No osteoblastic or osteolytic lesions. Stable changes from a previous L3-L4 posterior lumbar spine fusion. Chronic bilateral pars defects with a grade 1 anterolisthesis at L5-S1. IMPRESSION: 1. Moderate left hydronephrosis. There is residual contrast filling of the left intrarenal collecting system and portions of the left ureter. This is consistent with moderate obstructive uropathy, which appears due to an area of irregular bladder wall thickening or mass crossing the left ureteral trigone. 2. Mild right hydronephrosis. Right hydronephrosis has increased from the previous CT scan. Left hydronephrosis is unchanged. There is left perinephric and peri ureteral stranding, which is similar to the prior exam which may be the result of the obstruction only, but infection should also be considered. 3. Small right and minimal left pleural effusions. Mild lung base interstitial thickening. Consider mild congestive heart failure given the symptoms of shortness of breath. Electronically Signed   By: Lajean Manes M.D.   On: 11/28/2017 16:18    Dg Chest Port 1 View  Result Date: 11/28/2017 CLINICAL DATA:  Per order- SOBHis chronic constipation has worsened over the past several days. He has also felt very poorly, unable to get out of bed today and low-grade fevers. Pt states Sob today. EXAM: PORTABLE CHEST 1 VIEW COMPARISON:  None. FINDINGS: There changes from prior cardiac surgery. The cardiac silhouette is normal in size and configuration. No mediastinal or hilar masses. No convincing adenopathy. Clear lungs.  No pleural effusion or pneumothorax. Skeletal structures are intact. IMPRESSION: No active disease. Electronically Signed   By: Lajean Manes M.D.   On: 11/28/2017 15:59  Acute Abdominal Series  Result Date: 11/29/2017 CLINICAL DATA:  Initial evaluation for constipation for 1 week. EXAM: DG ABDOMEN ACUTE W/ 1V CHEST COMPARISON:  Prior CT from 11/28/2017. FINDINGS: Median sternotomy wires underlying cardiomegaly. Mediastinal silhouette normal. Lungs hypoinflated. Mild diffuse pulmonary interstitial congestion without frank alveolar edema. No other focal infiltrates. No pleural effusion. No pneumothorax. Few scattered gas-filled loops of bowel seen throughout the abdomen without evidence for obstruction or ileus. No free air seen on lateral decubitus view. No significant bowel wall thickening. Overall stool burden is relatively mild. Soft tissue mass or abnormal calcification. Left-sided nephrostomy tube noted. Dextroscoliosis with postsurgical changes noted within the lower lumbar spine. IMPRESSION: 1. Nonobstructive bowel gas pattern with no radiographic evidence for acute intra-abdominal pathology. Overall stool burden is mild. 2. Percutaneous nephrostomy tube in place. 3. Stable cardiomegaly with mild diffuse pulmonary interstitial congestion without frank pulmonary edema. Electronically Signed   By: Jeannine Boga M.D.   On: 11/29/2017 01:00    Pending Labs Unresulted Labs (From admission, onward)   Start     Ordered    11/28/17 2137  Magnesium  Add-on,   R     11/28/17 2136   11/28/17 2137  Lactate dehydrogenase  Once,   R     11/28/17 2136   11/28/17 2137  Haptoglobin  Once,   R     11/28/17 2136   11/28/17 2137  Pathologist smear review  Once,   R     11/28/17 2136   11/28/17 2018  Urine culture  Once,   R    Comments:  Aspirate of urine from left renal collecting system   Question:  Patient immune status  Answer:  Normal   11/28/17 2017   11/28/17 1924  Ferritin  (Anemia Panel ([facility]))  Add-on,   R     11/28/17 1923   11/28/17 1923  Vitamin B12  (Anemia Panel ([facility]))  Add-on,   R     11/28/17 1923   11/28/17 1923  Folate  (Anemia Panel ([facility]))  Add-on,   R     11/28/17 1923   11/28/17 1923  Iron and TIBC  (Anemia Panel ([facility]))  Add-on,   R     11/28/17 1923   11/28/17 1922  Troponin I (q 6hr x 3)  Now then every 6 hours,   R     11/28/17 1922   11/28/17 1731  Culture, Urine  Once,   R     11/28/17 1730   11/28/17 1518  Culture, blood (Routine X 2) w Reflex to ID Panel  BLOOD CULTURE X 2,   STAT     11/28/17 1521      Vitals/Pain Today's Vitals   11/29/17 0618 11/29/17 0700 11/29/17 0718 11/29/17 0730  BP:  (!) 104/56  117/63  Pulse:  (!) 54  (!) 57  Resp:  14  12  Temp:   98.4 F (36.9 C)   TempSrc:      SpO2:  95%  94%  PainSc: 2        Isolation Precautions No active isolations  Medications Medications  cefTRIAXone (ROCEPHIN) 2 g in dextrose 5 % 50 mL IVPB (0 g Intravenous Stopped 11/29/17 0116)  vancomycin (VANCOCIN) IVPB 1000 mg/200 mL premix (0 mg Intravenous Stopped 11/29/17 0451)  hydrocortisone (ANUSOL-HC) suppository 25 mg (not administered)  nitroGLYCERIN (NITROSTAT) SL tablet 0.4 mg (not administered)  isosorbide mononitrate (IMDUR) 24 hr tablet 60 mg (60 mg Oral Given 11/28/17 2317)  pramipexole (  MIRAPEX) tablet 0.25 mg (0.25 mg Oral Given 11/28/17 2317)  acetaminophen (TYLENOL) tablet 650 mg (not administered)    Or  acetaminophen (TYLENOL) suppository 650 mg (not  administered)  oxyCODONE (Oxy IR/ROXICODONE) immediate release tablet 5 mg (not administered)  senna (SENOKOT) tablet 8.6 mg (8.6 mg Oral Given 11/28/17 2149)  bisacodyl (DULCOLAX) suppository 10 mg (not administered)  ondansetron (ZOFRAN) tablet 4 mg (not administered)    Or  ondansetron (ZOFRAN) injection 4 mg (not administered)  albuterol (PROVENTIL) (2.5 MG/3ML) 0.083% nebulizer solution 2.5 mg (not administered)  metoprolol tartrate (LOPRESSOR) injection 5 mg (5 mg Intravenous Given 11/29/17 0544)  pantoprazole (PROTONIX) injection 40 mg (40 mg Intravenous Given 11/28/17 2149)  morphine 2 MG/ML injection 2 mg (2 mg Intravenous Given 11/29/17 0552)  sodium chloride flush (NS) 0.9 % injection 5 mL (5 mLs Intravenous Not Given 11/29/17 0444)  piperacillin-tazobactam (ZOSYN) IVPB 3.375 g (0 g Intravenous Stopped 11/28/17 1902)  vancomycin (VANCOCIN) IVPB 1000 mg/200 mL premix (0 mg Intravenous Stopped 11/28/17 2044)  lidocaine (XYLOCAINE) 1 % (with pres) injection (  Given by Other 11/28/17 1930)  midazolam (VERSED) 2 MG/2ML injection (  Given by Other 11/28/17 1945)  fentaNYL (SUBLIMAZE) 100 MCG/2ML injection (  Given by Other 11/28/17 1945)  midazolam (VERSED) injection (1 mg Intravenous Given 11/28/17 2000)  fentaNYL (SUBLIMAZE) injection (25 mcg Intravenous Given 11/28/17 2006)  lidocaine (XYLOCAINE) 1 % (with pres) injection (10 mLs  Given 11/28/17 2003)  iopamidol (ISOVUE-300) 61 % injection 50 mL (15 mLs Other Contrast Given 11/28/17 2017)  lactated ringers bolus 500 mL (0 mLs Intravenous Stopped 11/29/17 0026)

## 2017-11-29 NOTE — Care Management Note (Signed)
Case Management Note  Patient Details  Name: Charles Coffey MRN: [id number] Date of Birth: August 25, 1935  Subjective/Objective:                  Left flank pain and fever  Action/Plan: Date:  November 29, 2017 Chart reviewed for concurrent status and case management needs.  Will continue to follow patient progress.  Discharge Planning: following for needs  Expected discharge date: December 02, 2017  Velva Harman, BSN, [place], [place]   Expected Discharge Date:                  Expected Discharge Plan:  Home/Self Care  In-House Referral:     Discharge planning Services  CM Consult  Post Acute Care Choice:    Choice offered to:     DME Arranged:    DME Agency:     HH Arranged:    HH Agency:     Status of Service:  In process, will continue to follow  If discussed at Long Length of Stay Meetings, dates discussed:    Additional Comments:  Leeroy Cha, RN 11/29/2017, 10:38 AM

## 2017-11-29 NOTE — Telephone Encounter (Signed)
Charles Coffey,  Please check on the patient who called over the weekend Agree that he needs to notify Dr. [name] of his condition in light of his recent procedure

## 2017-11-29 NOTE — Progress Notes (Signed)
PROGRESS NOTE    Charles Coffey  YDX:[id number] DOB: [birth year] DOA: 11/28/2017 PCP: [name], MD   Brief Narrative:  Patient is a 82 year old gentleman history of invasive urothelial carcinoma involving left urethra and trigone status post recent cystoscopy and left retrograde pyelogram with TURBT on 11/25/2017, history of hypertension hyperlipidemia presenting to the ED with fever as high as 101, constipation noted to have fever, ileus, moderate bilateral hydronephrosis.  Patient underwent left percutaneous nephrostomy on admission.  Urology following.  Oncology consulted.   Assessment & Plan:   Principal Problem:   Fever Active Problems:   Pyelonephritis   Hyperlipidemia LDL goal <70   RESTLESS LEGS SYNDROME   Essential hypertension   Coronary artery disease of native heart with stable angina pectoris (HCC)   GERD   Cardiomyopathy, ischemic   Chronic low back pain   PAD (peripheral artery disease) (HCC)   Anemia   Stage 3 chronic kidney disease (HCC)   Hydronephrosis due to obstructive malignant neoplasm of bladder (HCC)   Ileus (HCC)   Elevated troponin   Urothelial carcinoma of bladder (HCC)   Dehydration   Thrombocytopenia ([place])  #1 fever Questionable etiology.  Likely secondary to acute pyelonephritis as patient with recent instrumentation in the bladder with cystoscopy, left retrograde pyelogram and TURBT on 11/25/2017.  Patient also noted to have left flank pain on examination.  Urinalysis is pending.  Blood cultures have been ordered which are pending.  Chest x-ray negative for any acute infiltrate.  CT abdomen and pelvis with perinephric stranding on the left and periurethral stranding as well as moderate bilateral hydronephrosis. Continue empiric IV vancomycin and IV Rocephin.  Follow.  2.  Probable left-sided pyelonephritis The patient presented with fevers, malaise.  Patient with recent instrumentation the bladder with cystoscopy, left retrograde pyelogram  and TURBT on 11/25/2017.  Patient also noted on examination to have left flank pain.  Urinalysis is pending.  Blood cultures have been ordered which are pending.    Continue empiric IV Rocephin.  Follow.  3.  Bilateral hydronephrosis/malignant obstruction of left kidney from invasive urothelial carcinoma Patient has been seen in consultation by urology.  Urology has consulted interventional radiology and patient status post left percutaneous nephrostomy tube placement 11/28/2017 per Dr. Kathlene Cote. Urinalysis has been obtained a urine cultures from [facility] and post nephrostomy are pending.  Continue empiric IV Rocephin. Per IR and urology.  4.  Elevated troponin Likely demand ischemia.  Patient had one episode of chest pain a day prior to admission which has since resolved.  EKG with a right bundle branch block with left anterior fascicular block with no significant change from prior EKG.  Patient with recent 2D echo done in September 2018 as such we will not repeat at this time.    Cardiac enzymes trending down.  No further workup at this time.  Continue home regimen of cardiac medications. Curb-sided cardiology who are in agreement.   5.  Ileus Likely secondary to problem #3.    Clinical improvement after nephrostomy tube placement..  Magnesium level at 2.7.  Potassium at 4.4.  Acute abdominal series with resolution of ileus.  Placed on clear liquids and advance as tolerated to a soft diet for supper.  Patient received a dose of Dulcolax suppository earlier on.  Follow.   6.  Chronic kidney disease stage III Stable.  Follow.  7.  Dehydration IV fluids.  8.  Hypertension We will transition from IV Lopressor back to home dose oral Coreg.  Continue imdur.  9.  Coronary artery disease/history of cardiomyopathy Patient noted to have elevated troponins.  Enzymes were elevated however trending down.  Likely secondary to acute illness. Patient currently asymptomatic.  Patient with a recent 2D echo and  as such we will not repeat at this time.  Continue current home oral cardiac medications.  Outpatient follow-up.   #10 anemia No overt bleeding. Likely secondary to anemia of chronic disease. Transfusion threshold hemoglobin less than 7.  11.  Thrombocytopenia Questionable etiology.  Likely secondary to acute infection.  LDH at 210.   Up to globin pending.  Patient has been seen by hematology/oncology will recommend monitoring at this time.  Follow.   12.  Urothelial carcinoma of the bladder Patient being followed by urology.  Oncology has been consulted and are following along with urology.         DVT prophylaxis: SCDs Code Status: Full Family Communication: Updated patient and wife at bedside. Disposition Plan: Clear home once medically stable and clinically improved.   Consultants:  Interventional radiology: Dr. Kathlene Cote 11/28/2017  Urology: Dr.Wrenn 11/28/2017  Oncology: Dr. [name] 11/29/2017  Procedures:   CT abdomen and pelvis 11/28/2017  Abdominal series 11/29/2017  Chest x-ray 11/28/2017  Percutaneous nephrostomy per Dr. Kathlene Cote interventional radiology 11/28/2017  Antimicrobials:   IV vancomycin 11/28/2017  IV Rocephin 11/28/2017   Subjective: Laying in bed.  Patient states he is feeling better.  Patient denies any shortness of breath.  Patient denies any chest pain.  Patient asking for food.  Objective: Vitals:   11/29/17 0700 11/29/17 0718 11/29/17 0730 11/29/17 0755  BP: (!) 104/56  117/63 (!) 141/69  Pulse: (!) 54  (!) 57 62  Resp: 14  12 14   Temp:  98.4 F (36.9 C)  98.7 F (37.1 C)  TempSrc:    Oral  SpO2: 95%  94% 95%    Intake/Output Summary (Last 24 hours) at 11/29/2017 1253 Last data filed at 11/29/2017 0957 Gross per 24 hour  Intake 175 ml  Output -  Net 175 ml   There were no vitals filed for this visit.  Examination:  General exam: Appears calm and comfortable  Respiratory system: Clear to auscultation. Respiratory effort  normal. Cardiovascular system: S1 & S2 heard, RRR. No JVD, murmurs, rubs, gallops or clicks. No pedal edema. Gastrointestinal system: Abdomen is nondistended, soft and nontender. No organomegaly or masses felt. Normal bowel sounds heard.  Nephrostomy tube noted on the left side with bloody drainage.  Left flank pain improved. Central nervous system: Alert and oriented. No focal neurological deficits. Extremities: Symmetric 5 x 5 power. Skin: No rashes, lesions or ulcers Psychiatry: Judgement and insight appear normal. Mood & affect appropriate.     Data Reviewed: I have personally reviewed following labs and imaging studies  CBC: Recent Labs  Lab 11/24/17 0813 11/28/17 1518 11/29/17 0540  WBC 5.3 9.9 6.8  NEUTROABS  --  6.9  --   HGB 10.1* 9.4* 8.1*  HCT 29.8* 27.5* 24.3*  MCV 90.9 91.4 90.7  PLT 82* 72* 59*   Basic Metabolic Panel: Recent Labs  Lab 11/24/17 0813 11/26/17 0526 11/28/17 1518 11/29/17 0540 11/29/17 0820  NA 139 137 133* 134*  --   K 4.3 5.3* 4.3 4.4  --   CL 111 108 104 105  --   CO2 22 22 20* 21*  --   GLUCOSE 103* 157* 100* 98  --   BUN 19 19 24* 26*  --   CREATININE 1.57* 1.58*  1.56* 1.55*  --   CALCIUM 9.4 9.3 9.1 8.5*  --   MG  --   --   --  2.3 2.7*   GFR: Estimated Creatinine Clearance: 41.4 mL/min (A) (by C-G formula based on SCr of 1.55 mg/dL (H)). Liver Function Tests: Recent Labs  Lab 11/28/17 1518  AST 23  ALT 18  ALKPHOS 51  BILITOT 1.1  PROT 6.7  ALBUMIN 4.0   No results for input(s): LIPASE, AMYLASE in the last 168 hours. No results for input(s): AMMONIA in the last 168 hours. Coagulation Profile: No results for input(s): INR, PROTIME in the last 168 hours. Cardiac Enzymes: Recent Labs  Lab 11/28/17 1922 11/29/17 0112 11/29/17 0710  TROPONINI 0.20* 0.17* 0.13*   BNP (last 3 results) No results for input(s): PROBNP in the last 8760 hours. HbA1C: No results for input(s): HGBA1C in the last 72 hours. CBG: No results  for input(s): GLUCAP in the last 168 hours. Lipid Profile: No results for input(s): CHOL, HDL, LDLCALC, TRIG, CHOLHDL, LDLDIRECT in the last 72 hours. Thyroid Function Tests: No results for input(s): TSH, T4TOTAL, FREET4, T3FREE, THYROIDAB in the last 72 hours. Anemia Panel: Recent Labs    11/29/17 0540  RETICCTPCT 2.0   Sepsis Labs: Recent Labs  Lab 11/28/17 1538  LATICACIDVEN 0.72    Recent Results (from the past 240 hour(s))  Culture, blood (Routine X 2) w Reflex to ID Panel     Status: None (Preliminary result)   Collection Time: 11/28/17  3:18 PM  Result Value Ref Range Status   Specimen Description BLOOD BLOOD RIGHT HAND  Final   Special Requests   Final    BOTTLES DRAWN AEROBIC AND ANAEROBIC Blood Culture adequate volume   Culture   Final    NO GROWTH < 24 HOURS Performed at [facility], [place] [address], [place], [place] [address]    Report Status PENDING  Incomplete  Culture, blood (Routine X 2) w Reflex to ID Panel     Status: None (Preliminary result)   Collection Time: 11/28/17  3:23 PM  Result Value Ref Range Status   Specimen Description BLOOD BLOOD LEFT FOREARM  Final   Special Requests   Final    BOTTLES DRAWN AEROBIC AND ANAEROBIC Blood Culture adequate volume   Culture   Final    NO GROWTH < 24 HOURS Performed at [facility], [place] [address], [place], [place] [address]    Report Status PENDING  Incomplete         Radiology Studies: Ct Abdomen Pelvis Wo Contrast  Result Date: 11/28/2017 CLINICAL DATA:  Family reports pt had renal and bladder biopsies on Thursday or Friday. Family reports pt has had a fever at home, nausea, dysuria, and AMS. Denies any antipyretic use today. [name]. EXAM: CT ABDOMEN AND PELVIS WITHOUT CONTRAST TECHNIQUE: Multidetector CT imaging of the abdomen and pelvis was performed following the standard protocol without IV contrast. COMPARISON:  11/11/2017 FINDINGS: Lower chest: Small  right and minimal left pleural effusions. Pleural effusions are new since the prior CT. Mild interstitial thickening at the lung bases as well as subsegmental atelectasis, greater on the right. Interstitial thickening is similar to the prior CT. No convincing pneumonia. Hepatobiliary: No focal liver abnormality is seen. No gallstones, gallbladder wall thickening, or biliary dilatation. Pancreas: Unremarkable. No pancreatic ductal dilatation or surrounding inflammatory changes. Spleen: Normal in size without focal abnormality. Adrenals/Urinary Tract: Residual contrast fills the moderately  dilated left intrarenal collecting system and portions of the left ureter. There is mild dilation of the right intrarenal collecting system and portions of the right ureter. There is diffuse left renal cortical thinning. No renal masses. No visualized stones. No adrenal masses. Irregular thickening along the posterior aspect of the bladder, greater on the left, is similar to the prior CT. Stomach/Bowel: Stomach is unremarkable. The small bowel and colon show air-fluid levels, but are nondilated with no wall thickening or adjacent inflammation. This is consistent with a mild diffuse adynamic ileus. No evidence of obstruction. Mild colonic stool burden. Appendix not visualized. Vascular/Lymphatic: Aortic atherosclerosis. No enlarged abdominal or pelvic lymph nodes. Reproductive: Prostate gland is enlarged this, bulging against the posterior inferior bladder base, stable from the recent CT. Other: No abdominal wall hernia or abnormality. No abdominopelvic ascites. Musculoskeletal: No fracture or acute finding. No osteoblastic or osteolytic lesions. Stable changes from a previous L3-L4 posterior lumbar spine fusion. Chronic bilateral pars defects with a grade 1 anterolisthesis at L5-S1. IMPRESSION: 1. Moderate left hydronephrosis. There is residual contrast filling of the left intrarenal collecting system and portions of the left  ureter. This is consistent with moderate obstructive uropathy, which appears due to an area of irregular bladder wall thickening or mass crossing the left ureteral trigone. 2. Mild right hydronephrosis. Right hydronephrosis has increased from the previous CT scan. Left hydronephrosis is unchanged. There is left perinephric and peri ureteral stranding, which is similar to the prior exam which may be the result of the obstruction only, but infection should also be considered. 3. Small right and minimal left pleural effusions. Mild lung base interstitial thickening. Consider mild congestive heart failure given the symptoms of shortness of breath. Electronically Signed   By: Lajean Manes M.D.   On: 11/28/2017 16:18   Dg Chest Port 1 View  Result Date: 11/28/2017 CLINICAL DATA:  Per order- SOBHis chronic constipation has worsened over the past several days. He has also felt very poorly, unable to get out of bed today and low-grade fevers. Pt states Sob today. EXAM: PORTABLE CHEST 1 VIEW COMPARISON:  None. FINDINGS: There changes from prior cardiac surgery. The cardiac silhouette is normal in size and configuration. No mediastinal or hilar masses. No convincing adenopathy. Clear lungs.  No pleural effusion or pneumothorax. Skeletal structures are intact. IMPRESSION: No active disease. Electronically Signed   By: Lajean Manes M.D.   On: 11/28/2017 15:59   Acute Abdominal Series  Result Date: 11/29/2017 CLINICAL DATA:  Initial evaluation for constipation for 1 week. EXAM: DG ABDOMEN ACUTE W/ 1V CHEST COMPARISON:  Prior CT from 11/28/2017. FINDINGS: Median sternotomy wires underlying cardiomegaly. Mediastinal silhouette normal. Lungs hypoinflated. Mild diffuse pulmonary interstitial congestion without frank alveolar edema. No other focal infiltrates. No pleural effusion. No pneumothorax. Few scattered gas-filled loops of bowel seen throughout the abdomen without evidence for obstruction or ileus. No free air seen  on lateral decubitus view. No significant bowel wall thickening. Overall stool burden is relatively mild. Soft tissue mass or abnormal calcification. Left-sided nephrostomy tube noted. Dextroscoliosis with postsurgical changes noted within the lower lumbar spine. IMPRESSION: 1. Nonobstructive bowel gas pattern with no radiographic evidence for acute intra-abdominal pathology. Overall stool burden is mild. 2. Percutaneous nephrostomy tube in place. 3. Stable cardiomegaly with mild diffuse pulmonary interstitial congestion without frank pulmonary edema. Electronically Signed   By: Jeannine Boga M.D.   On: 11/29/2017 01:00        Scheduled Meds: . isosorbide mononitrate  60 mg  Oral Daily  . metoprolol tartrate  5 mg Intravenous Q8H  . pantoprazole (PROTONIX) IV  40 mg Intravenous Q24H  . pramipexole  0.25 mg Oral TID  . senna  1 tablet Oral BID  . sodium chloride flush  5 mL Intravenous Q8H   Continuous Infusions: . cefTRIAXone (ROCEPHIN)  IV Stopped (11/29/17 0116)  . vancomycin Stopped (11/29/17 0451)     LOS: 1 day    Time spent: 35 minutes    Irine Seal, MD [facility] Pager [phone]  If 7PM-7AM, please contact night-coverage [email or web address] Password [facility] 11/29/2017, 12:53 PM

## 2017-11-29 NOTE — Progress Notes (Signed)
OT Cancellation Note  Patient Details Name: Charles Coffey MRN: [id number] DOB: 1935/05/13   Cancelled Treatment:    Reason Eval/Treat Not Completed: OT screened, no needs identified, will sign off.  Pt reports he's been performing ADLs independently and ambulating in the hallways without difficulty.    [facility], OTR/L [phone]   Lucille Passy M 11/29/2017, 3:08 PM

## 2017-11-29 NOTE — Progress Notes (Signed)
Subjective: CC: Left flank pain and fever.  Hx:  Charles Coffey has severe left hydro with fever post recent retrograde.   He had a left NT placed last night and his pain has markedly improved.  His tmax is 101.8 but he is now afebrile on rocephin and vancomycin.   Cultures are pending.  Cr is minimally changed at 1.55 and he remains anemic with a Hgb of 8.1.    ROS:  Review of Systems  Constitutional: Negative for chills and fever.  Respiratory: Negative for shortness of breath.   Cardiovascular: Negative for chest pain.  Genitourinary: Positive for flank pain (improved with perc.). Negative for hematuria.    Anti-infectives: Anti-infectives (From admission, onward)   Start     Dose/Rate Route Frequency Ordered Stop   11/29/17 0000  cefTRIAXone (ROCEPHIN) 2 g in dextrose 5 % 50 mL IVPB     2 g 100 mL/hr over 30 Minutes Intravenous Every 24 hours 11/28/17 1921     11/29/17 0000  vancomycin (VANCOCIN) IVPB 1000 mg/200 mL premix     1,000 mg 200 mL/hr over 60 Minutes Intravenous Every 24 hours 11/28/17 1934     11/28/17 1745  cefTRIAXone (ROCEPHIN) 2 g in dextrose 5 % 50 mL IVPB  Status:  Discontinued     2 g 100 mL/hr over 30 Minutes Intravenous  Once 11/28/17 1734 11/28/17 1735   11/28/17 1745  piperacillin-tazobactam (ZOSYN) IVPB 3.375 g     3.375 g 100 mL/hr over 30 Minutes Intravenous  Once 11/28/17 1734 11/28/17 1902   11/28/17 1745  vancomycin (VANCOCIN) IVPB 1000 mg/200 mL premix     1,000 mg 200 mL/hr over 60 Minutes Intravenous  Once 11/28/17 1734 11/28/17 2044      Current Facility-Administered Medications  Medication Dose Route Frequency Provider Last Rate Last Dose  . acetaminophen (TYLENOL) tablet 650 mg  650 mg Oral Q6H PRN Eugenie Filler, MD       Or  . acetaminophen (TYLENOL) suppository 650 mg  650 mg Rectal Q6H PRN Eugenie Filler, MD      . albuterol (PROVENTIL) (2.5 MG/3ML) 0.083% nebulizer solution 2.5 mg  2.5 mg Nebulization Q2H PRN Eugenie Filler, MD      . bisacodyl (DULCOLAX) suppository 10 mg  10 mg Rectal Daily PRN Eugenie Filler, MD      . cefTRIAXone (ROCEPHIN) 2 g in dextrose 5 % 50 mL IVPB  2 g Intravenous Q24H Eugenie Filler, MD   Stopped at 11/29/17 0116  . hydrocortisone (ANUSOL-HC) suppository 25 mg  25 mg Rectal PRN Eugenie Filler, MD      . isosorbide mononitrate (IMDUR) 24 hr tablet 60 mg  60 mg Oral Daily Eugenie Filler, MD   60 mg at 11/28/17 2317  . metoprolol tartrate (LOPRESSOR) injection 5 mg  5 mg Intravenous Q8H Eugenie Filler, MD   5 mg at 11/29/17 0544  . morphine 2 MG/ML injection 2 mg  2 mg Intravenous Q4H PRN Eugenie Filler, MD   2 mg at 11/29/17 8756  . nitroGLYCERIN (NITROSTAT) SL tablet 0.4 mg  0.4 mg Sublingual Q5 min PRN Eugenie Filler, MD      . ondansetron [facility]) tablet 4 mg  4 mg Oral Q6H PRN Eugenie Filler, MD       Or  . ondansetron [facility]) injection 4 mg  4 mg Intravenous Q6H PRN Eugenie Filler, MD      . oxyCODONE (Oxy  IR/ROXICODONE) immediate release tablet 5 mg  5 mg Oral Q4H PRN Eugenie Filler, MD      . pantoprazole (PROTONIX) injection 40 mg  40 mg Intravenous Q24H Eugenie Filler, MD   40 mg at 11/28/17 2149  . pramipexole (MIRAPEX) tablet 0.25 mg  0.25 mg Oral TID Eugenie Filler, MD   0.25 mg at 11/28/17 2317  . senna (SENOKOT) tablet 8.6 mg  1 tablet Oral BID Eugenie Filler, MD   8.6 mg at 11/28/17 2149  . sodium chloride flush (NS) 0.9 % injection 5 mL  5 mL Intravenous Q8H Aletta Edouard, MD   5 mL at 11/29/17 0430  . vancomycin (VANCOCIN) IVPB 1000 mg/200 mL premix  1,000 mg Intravenous Q24H Dara Hoyer, [facility]   Stopped at 11/29/17 0451   Current Outpatient Medications  Medication Sig Dispense Refill  . acetaminophen (TYLENOL) 500 MG tablet Take 1,000 mg by mouth every 8 (eight) hours as needed for mild pain or moderate pain.    Marland Kitchen aspirin 81 MG tablet Take 81 mg by mouth daily.      . carvedilol (COREG) 6.25 MG tablet Take 1  tablet (6.25 mg total) 2 (two) times daily with a meal by mouth. 180 tablet 3  . diphenhydramine-acetaminophen (TYLENOL PM EXTRA STRENGTH) 25-500 MG TABS tablet Take 1 tablet by mouth at bedtime as needed.     . Hydrocortisone Acetate (HEMORRHOIDAL-HC RE) Place 1 application rectally as needed (hemorrhoids).    . isosorbide mononitrate (IMDUR) 60 MG 24 hr tablet Take 1 tablet (60 mg total) by mouth daily. 90 tablet 1  . lubiprostone (AMITIZA) 24 MCG capsule Take 1 capsule (24 mcg total) by mouth 2 (two) times daily with a meal. 60 capsule 3  . magnesium citrate SOLN Take 1 Bottle by mouth once.    . nitroGLYCERIN (NITROSTAT) 0.4 MG SL tablet Place 1 tablet (0.4 mg total) under the tongue every 5 (five) minutes as needed. (Patient taking differently: Place 0.4 mg under the tongue every 5 (five) minutes as needed for chest pain. ) 25 tablet 3  . Plant Sterols and Stanols (CHOLESTOFF PO) Take 2 tablets by mouth 2 (two) times daily.    . polyethylene glycol (MIRALAX / GLYCOLAX) packet Take 17 g by mouth daily as needed for moderate constipation.    . pramipexole (MIRAPEX) 1 MG tablet TAKE 1 AND 1/2 TABLETS BY MOUTH AT BEDTIME 45 tablet 11  . ranitidine (ZANTAC) 300 MG tablet TAKE 1 TABLET BY MOUTH TWICE A DAY 60 tablet 3  . Tetrahydroz-Glyc-Hyprom-PEG (VISINE MAXIMUM REDNESS RELIEF OP) Place 2 drops into both eyes 2 (two) times daily as needed (dry eyes).     . traMADol (ULTRAM) 50 MG tablet Take 50 mg by mouth every 6 (six) hours as needed for moderate pain.     Marland Kitchen linaclotide (LINZESS) 290 MCG CAPS capsule TAKE 1 CAPSULE (290 MCG TOTAL) BY MOUTH DAILY. [place] (Patient not taking: Reported on 11/24/2017) 30 capsule 10     Objective: Vital signs in last 24 hours: Temp:  [98.4 F (36.9 C)-101.8 F (38.8 C)] 98.4 F (36.9 C) (12/03 0718) Pulse Rate:  [50-81] 57 (12/03 0730) Resp:  [10-28] 12 (12/03 0730) BP: (104-147)/(55-81) 117/63 (12/03 0730) SpO2:  [88 %-100 %] 94 % (12/03  0730)  Intake/Output from previous day: 12/02 0701 - 12/03 0700 In: 48 [I.V.:5; IV Piggyback:50] Out: -  Intake/Output this shift: No intake/output data recorded.   Physical  Exam  Constitutional: He is oriented to person, place, and time and well-developed, well-nourished, and in no distress.  Cardiovascular: Normal rate and regular rhythm.  Pulmonary/Chest: Effort normal. No respiratory distress.  Abdominal: Soft. There is no tenderness.  Draining bloody urine from the NT.   Musculoskeletal: Normal range of motion. He exhibits no edema or tenderness.  Neurological: He is alert and oriented to person, place, and time.  Skin: Skin is warm and dry.  Vitals reviewed.   Lab Results:  Recent Labs    11/28/17 1518 11/29/17 0540  WBC 9.9 6.8  HGB 9.4* 8.1*  HCT 27.5* 24.3*  PLT 72* 59*   BMET Recent Labs    11/28/17 1518 11/29/17 0540  NA 133* 134*  K 4.3 4.4  CL 104 105  CO2 20* 21*  GLUCOSE 100* 98  BUN 24* 26*  CREATININE 1.56* 1.55*  CALCIUM 9.1 8.5*   PT/INR No results for input(s): LABPROT, INR in the last 72 hours. ABG No results for input(s): PHART, HCO3 in the last 72 hours.  Invalid input(s): PCO2, PO2  Studies/Results: Ct Abdomen Pelvis Wo Contrast  Result Date: 11/28/2017 CLINICAL DATA:  Family reports pt had renal and bladder biopsies on Thursday or Friday. Family reports pt has had a fever at home, nausea, dysuria, and AMS. Denies any antipyretic use today. [name]. EXAM: CT ABDOMEN AND PELVIS WITHOUT CONTRAST TECHNIQUE: Multidetector CT imaging of the abdomen and pelvis was performed following the standard protocol without IV contrast. COMPARISON:  11/11/2017 FINDINGS: Lower chest: Small right and minimal left pleural effusions. Pleural effusions are new since the prior CT. Mild interstitial thickening at the lung bases as well as subsegmental atelectasis, greater on the right. Interstitial thickening is similar to the  prior CT. No convincing pneumonia. Hepatobiliary: No focal liver abnormality is seen. No gallstones, gallbladder wall thickening, or biliary dilatation. Pancreas: Unremarkable. No pancreatic ductal dilatation or surrounding inflammatory changes. Spleen: Normal in size without focal abnormality. Adrenals/Urinary Tract: Residual contrast fills the moderately dilated left intrarenal collecting system and portions of the left ureter. There is mild dilation of the right intrarenal collecting system and portions of the right ureter. There is diffuse left renal cortical thinning. No renal masses. No visualized stones. No adrenal masses. Irregular thickening along the posterior aspect of the bladder, greater on the left, is similar to the prior CT. Stomach/Bowel: Stomach is unremarkable. The small bowel and colon show air-fluid levels, but are nondilated with no wall thickening or adjacent inflammation. This is consistent with a mild diffuse adynamic ileus. No evidence of obstruction. Mild colonic stool burden. Appendix not visualized. Vascular/Lymphatic: Aortic atherosclerosis. No enlarged abdominal or pelvic lymph nodes. Reproductive: Prostate gland is enlarged this, bulging against the posterior inferior bladder base, stable from the recent CT. Other: No abdominal wall hernia or abnormality. No abdominopelvic ascites. Musculoskeletal: No fracture or acute finding. No osteoblastic or osteolytic lesions. Stable changes from a previous L3-L4 posterior lumbar spine fusion. Chronic bilateral pars defects with a grade 1 anterolisthesis at L5-S1. IMPRESSION: 1. Moderate left hydronephrosis. There is residual contrast filling of the left intrarenal collecting system and portions of the left ureter. This is consistent with moderate obstructive uropathy, which appears due to an area of irregular bladder wall thickening or mass crossing the left ureteral trigone. 2. Mild right hydronephrosis. Right hydronephrosis has increased from  the previous CT scan. Left hydronephrosis is unchanged. There is left perinephric and peri ureteral stranding, which is similar  to the prior exam which may be the result of the obstruction only, but infection should also be considered. 3. Small right and minimal left pleural effusions. Mild lung base interstitial thickening. Consider mild congestive heart failure given the symptoms of shortness of breath. Electronically Signed   By: Lajean Manes M.D.   On: 11/28/2017 16:18   Dg Chest Port 1 View  Result Date: 11/28/2017 CLINICAL DATA:  Per order- SOBHis chronic constipation has worsened over the past several days. He has also felt very poorly, unable to get out of bed today and low-grade fevers. Pt states Sob today. EXAM: PORTABLE CHEST 1 VIEW COMPARISON:  None. FINDINGS: There changes from prior cardiac surgery. The cardiac silhouette is normal in size and configuration. No mediastinal or hilar masses. No convincing adenopathy. Clear lungs.  No pleural effusion or pneumothorax. Skeletal structures are intact. IMPRESSION: No active disease. Electronically Signed   By: Lajean Manes M.D.   On: 11/28/2017 15:59   Acute Abdominal Series  Result Date: 11/29/2017 CLINICAL DATA:  Initial evaluation for constipation for 1 week. EXAM: DG ABDOMEN ACUTE W/ 1V CHEST COMPARISON:  Prior CT from 11/28/2017. FINDINGS: Median sternotomy wires underlying cardiomegaly. Mediastinal silhouette normal. Lungs hypoinflated. Mild diffuse pulmonary interstitial congestion without frank alveolar edema. No other focal infiltrates. No pleural effusion. No pneumothorax. Few scattered gas-filled loops of bowel seen throughout the abdomen without evidence for obstruction or ileus. No free air seen on lateral decubitus view. No significant bowel wall thickening. Overall stool burden is relatively mild. Soft tissue mass or abnormal calcification. Left-sided nephrostomy tube noted. Dextroscoliosis with postsurgical changes noted within the  lower lumbar spine. IMPRESSION: 1. Nonobstructive bowel gas pattern with no radiographic evidence for acute intra-abdominal pathology. Overall stool burden is mild. 2. Percutaneous nephrostomy tube in place. 3. Stable cardiomegaly with mild diffuse pulmonary interstitial congestion without frank pulmonary edema. Electronically Signed   By: Jeannine Boga M.D.   On: 11/29/2017 01:00     Assessment and Plan: Left hydronephrosis from malignant obstruction with associated fever.   He is now doing well post left NT placement.   IR could eventually attempt an antegrade stent placement but I think the probability of success is low.    Urothelial carcinoma involving the left upper tract and invasive into the bladder with severe left and mild but progressive right hydro.   He will need to be evaluated by oncology for chemotherapy with possible surgical resection including left nephroureterectomy and cystectomy depending on response to chemo.        LOS: 1 day    Irine Seal 11/29/2017 [phone] ID: [name], male   DOB: 1935-09-18, 82 y.o.   MRN: [id number]

## 2017-11-29 NOTE — ED Notes (Signed)
Provider at bedside

## 2017-11-29 NOTE — ED Notes (Signed)
No respiratory or acute distress noted alert and oriented x 3 call light in reach no reaction to medication noted. 

## 2017-11-29 NOTE — Consult Note (Signed)
Reason for Referral: Urothelial cancer.  HPI: 82 year old gentleman with history of mild anemia and thrombocytopenia that I saw in consultation back in September 2018.  He had been under observation and surveillance from that standpoint.  He was found to have hematuria and his workup showed a left bladder mass with ureteral involvement and left hydronephrosis based on a CT scan.  He has subsequently evaluated by Dr. [name] and underwent a TURBT on 11/25/2017.  He was found to have a 4 cm bladder neck mass although a stent could not be placed.  He was hospitalized urgently on November 28, 2017 for urosepsis and urgently had a nephrostomy tube placed by Dr. Kathlene Cote.  I was to comment about these findings regarding his urothelial cancer.  Clinically, he feels better and was started on intravenous antibiotics.  His creatinine is currently at 1.55 with creatinine clearance of about 41 cc per minute.  He does have a mild anemia with a hemoglobin of 8.1 with a platelet count of 59 in the setting of acute infection.  He does report hematuria still.  He does not report any headaches, blurry vision, syncope or seizures.  He does not report any fevers or chills or sweats.  He does not report any cough, wheezing or hemoptysis.  He does not report any nausea, vomiting or abdominal pain.  He does not report any dysuria, frequency or urgency.  He does not report a skeletal complaints.  Remaining review of systems unremarkable.  Past Medical History:  Diagnosis Date  . Aortic atherosclerosis ([place])   . BPH with urinary obstruction   . CAD (coronary artery disease)    a.  MI 1995, CABG x 3 2002 (patient says that he had LIMA and RIMA grafts). b. ETT-Cardiolite (10/15) with EF 48%, apical scar, no ischemia. c. Nuc 07/2017 abnormal -> cath was performed,  patent LIMA to LAD and SVG to OM 2. RIMA to RCA is atretic. The right coronary artery is occluded distally with extensive collaterals from the LAD, medical therapy.   .  Cervical spondylosis without myelopathy 2020/06/2616  . Chronic low back pain    sees Dr. Kary Kos   . GERD (gastroesophageal reflux disease)   . Hiatal hernia   . Hyperlipidemia   . Hypertension   . IBS (irritable bowel syndrome)   . Ischemic cardiomyopathy   . Myocardial infarction ([place])   . RBBB   . Restless legs syndrome   . Statin intolerance   . Syncope    a. in 2015 - no apparent cause, was taking sleep medicine at the time. Cardiac workup unremarkable.  . Tubular adenoma of colon   :  Past Surgical History:  Procedure Laterality Date  . COLONOSCOPY  10/17/2014   per Dr. Hilarie Fredrickson, tubular adenomas, repeat in 3 yrs   . CYSTOSCOPY W/ RETROGRADES Left 11/25/2017   Procedure: CYSTOSCOPY WITH RETROGRADE PYELOGRAM;  Surgeon: Irine Seal, MD;  Location: [facility];  Service: Urology;  Laterality: Left;  . HEART BYPASS    . LEFT HEART CATH AND CORS/GRAFTS ANGIOGRAPHY N/A 08/25/2017   Procedure: LEFT HEART CATH AND CORS/GRAFTS ANGIOGRAPHY;  Surgeon: Wellington Hampshire, MD;  Location: [facility];  Service: Cardiovascular;  Laterality: N/A;  . LUMBAR FUSION  2003   L3-L4  . PROSTATE SURGERY  06-27-12   per Dr. Roni Bread, had CTT  . TONSILLECTOMY    . TRANSURETHRAL RESECTION OF BLADDER TUMOR N/A 11/25/2017   Procedure: TRANSURETHRAL RESECTION OF BLADDER TUMOR (TURBT);  Surgeon: [name],  Jenny Reichmann, MD;  Location: [facility];  Service: Urology;  Laterality: N/A;  :   Current Facility-Administered Medications:  .  acetaminophen (TYLENOL) tablet 650 mg, 650 mg, Oral, Q6H PRN **OR** acetaminophen (TYLENOL) suppository 650 mg, 650 mg, Rectal, Q6H PRN, Eugenie Filler, MD .  albuterol (PROVENTIL) (2.5 MG/3ML) 0.083% nebulizer solution 2.5 mg, 2.5 mg, Nebulization, Q2H PRN, Eugenie Filler, MD .  bisacodyl (DULCOLAX) suppository 10 mg, 10 mg, Rectal, Daily PRN, Eugenie Filler, MD .  cefTRIAXone (ROCEPHIN) 2 g in dextrose 5 % 50 mL IVPB, 2 g, Intravenous, Q24H, Eugenie Filler, MD, Stopped at  11/29/17 0116 .  hydrocortisone (ANUSOL-HC) suppository 25 mg, 25 mg, Rectal, PRN, Eugenie Filler, MD .  isosorbide mononitrate (IMDUR) 24 hr tablet 60 mg, 60 mg, Oral, Daily, Eugenie Filler, MD, 60 mg at 11/29/17 7253 .  metoprolol tartrate (LOPRESSOR) injection 5 mg, 5 mg, Intravenous, Q8H, Eugenie Filler, MD, 5 mg at 11/29/17 0544 .  morphine 4 MG/ML injection 2 mg, 2 mg, Intravenous, Q4H PRN, Eugenie Filler, MD .  nitroGLYCERIN (NITROSTAT) SL tablet 0.4 mg, 0.4 mg, Sublingual, Q5 min PRN, Eugenie Filler, MD .  ondansetron [facility]) tablet 4 mg, 4 mg, Oral, Q6H PRN **OR** ondansetron (ZOFRAN) injection 4 mg, 4 mg, Intravenous, Q6H PRN, Eugenie Filler, MD .  oxyCODONE (Oxy IR/ROXICODONE) immediate release tablet 5 mg, 5 mg, Oral, Q4H PRN, Eugenie Filler, MD .  pantoprazole (PROTONIX) injection 40 mg, 40 mg, Intravenous, Q24H, Eugenie Filler, MD, 40 mg at 11/28/17 2149 .  pramipexole (MIRAPEX) tablet 0.25 mg, 0.25 mg, Oral, TID, Eugenie Filler, MD, 0.25 mg at 11/28/17 2317 .  senna (SENOKOT) tablet 8.6 mg, 1 tablet, Oral, BID, Eugenie Filler, MD, 8.6 mg at 11/29/17 6644 .  sodium chloride flush (NS) 0.9 % injection 5 mL, 5 mL, Intravenous, Q8H, Aletta Edouard, MD, 5 mL at 11/29/17 0430 .  vancomycin (VANCOCIN) IVPB 1000 mg/200 mL premix, 1,000 mg, Intravenous, Q24H, Dara Hoyer, RPH, Stopped at 11/29/17 0451:  Allergies  Allergen Reactions  . Antihistamines, Loratadine-Type Other (See Comments)    Unable to urinate  . Statins Other (See Comments)    liver effects  :  Family History  Problem Relation Age of Onset  . Heart disease Mother   . Heart attack Mother   . Aneurysm Father        femoral artery  . Heart disease Brother   . Heart disease Maternal Uncle        x 2  . Colon cancer Neg Hx   . Esophageal cancer Neg Hx   . Pancreatic cancer Neg Hx   . Kidney disease Neg Hx   . Liver disease Neg Hx   :  Social History    Socioeconomic History  . Marital status: Married    Spouse name: Not on file  . Number of children: 5  . Years of education: Some coll  . Highest education level: Not on file  Social Needs  . Financial resource strain: Not on file  . Food insecurity - worry: Not on file  . Food insecurity - inability: Not on file  . Transportation needs - medical: Not on file  . Transportation needs - non-medical: Not on file  Occupational History  . Occupation: retired  Tobacco Use  . Smoking status: Former Smoker    Types: Cigarettes    Last attempt to quit: 12/28/1978    Years since quitting:  38.9  . Smokeless tobacco: Never Used  Substance and Sexual Activity  . Alcohol use: Yes    Alcohol/week: 0.0 oz    Comment: occ-  . Drug use: No  . Sexual activity: Not on file  Other Topics Concern  . Not on file  Social History Narrative   Lives at home w/ his wife   Right-handed   5 cups of coffee per day  :  Pertinent items are noted in HPI.  Exam: Blood pressure (!) 141/69, pulse 62, temperature 98.7 F (37.1 C), temperature source Oral, resp. rate 14, SpO2 95 %. General appearance: alert and cooperative Throat: lips, mucosa, and tongue normal; teeth and gums normal Neck: no adenopathy Back: negative Resp: clear to auscultation bilaterally Chest wall: no tenderness Cardio: regular rate and rhythm, S1, S2 normal, no murmur, click, rub or gallop GI: soft, non-tender; bowel sounds normal; no masses,  no organomegaly Extremities: extremities normal, atraumatic, no cyanosis or edema Pulses: 2+ and symmetric  Recent Labs    11/28/17 1518 11/29/17 0540  WBC 9.9 6.8  HGB 9.4* 8.1*  HCT 27.5* 24.3*  PLT 72* 59*   Recent Labs    11/28/17 1518 11/29/17 0540  NA 133* 134*  K 4.3 4.4  CL 104 105  CO2 20* 21*  GLUCOSE 100* 98  BUN 24* 26*  CREATININE 1.56* 1.55*  CALCIUM 9.1 8.5*       Ct Abdomen Pelvis Wo Contrast  Result Date: 11/28/2017 CLINICAL DATA:  Family reports  pt had renal and bladder biopsies on Thursday or Friday. Family reports pt has had a fever at home, nausea, dysuria, and AMS. Denies any antipyretic use today. [name]. EXAM: CT ABDOMEN AND PELVIS WITHOUT CONTRAST TECHNIQUE: Multidetector CT imaging of the abdomen and pelvis was performed following the standard protocol without IV contrast. COMPARISON:  11/11/2017 FINDINGS: Lower chest: Small right and minimal left pleural effusions. Pleural effusions are new since the prior CT. Mild interstitial thickening at the lung bases as well as subsegmental atelectasis, greater on the right. Interstitial thickening is similar to the prior CT. No convincing pneumonia. Hepatobiliary: No focal liver abnormality is seen. No gallstones, gallbladder wall thickening, or biliary dilatation. Pancreas: Unremarkable. No pancreatic ductal dilatation or surrounding inflammatory changes. Spleen: Normal in size without focal abnormality. Adrenals/Urinary Tract: Residual contrast fills the moderately dilated left intrarenal collecting system and portions of the left ureter. There is mild dilation of the right intrarenal collecting system and portions of the right ureter. There is diffuse left renal cortical thinning. No renal masses. No visualized stones. No adrenal masses. Irregular thickening along the posterior aspect of the bladder, greater on the left, is similar to the prior CT. Stomach/Bowel: Stomach is unremarkable. The small bowel and colon show air-fluid levels, but are nondilated with no wall thickening or adjacent inflammation. This is consistent with a mild diffuse adynamic ileus. No evidence of obstruction. Mild colonic stool burden. Appendix not visualized. Vascular/Lymphatic: Aortic atherosclerosis. No enlarged abdominal or pelvic lymph nodes. Reproductive: Prostate gland is enlarged this, bulging against the posterior inferior bladder base, stable from the recent CT. Other: No abdominal  wall hernia or abnormality. No abdominopelvic ascites. Musculoskeletal: No fracture or acute finding. No osteoblastic or osteolytic lesions. Stable changes from a previous L3-L4 posterior lumbar spine fusion. Chronic bilateral pars defects with a grade 1 anterolisthesis at L5-S1. IMPRESSION: 1. Moderate left hydronephrosis. There is residual contrast filling of the left intrarenal collecting system and portions of  the left ureter. This is consistent with moderate obstructive uropathy, which appears due to an area of irregular bladder wall thickening or mass crossing the left ureteral trigone. 2. Mild right hydronephrosis. Right hydronephrosis has increased from the previous CT scan. Left hydronephrosis is unchanged. There is left perinephric and peri ureteral stranding, which is similar to the prior exam which may be the result of the obstruction only, but infection should also be considered. 3. Small right and minimal left pleural effusions. Mild lung base interstitial thickening. Consider mild congestive heart failure given the symptoms of shortness of breath. Electronically Signed   By: Lajean Manes M.D.   On: 11/28/2017 16:18     Assessment and Plan:   82 year old gentleman with the following issues:  1.  High-grade urothelial carcinoma arising from the bladder diagnosed on November 25, 2017.  CT scan of the abdomen and pelvis obtained on 11/28/2017 showed a moderate left hydronephrosis without any evidence of lymphadenopathy or distant metastasis.  The natural course of this disease was discussed with the patient.  Treatment strategies include primary surgical therapy after systemic chemotherapy versus chemotherapy radiation as a definitive modality were reviewed.  I believe he is a surgical candidate and curative surgery is indicated.  The rationale for using neoadjuvant chemotherapy was discussed today.  There is certainly improved overall survival with the role of neoadjuvant chemotherapy.  I  believe he is a marginal candidate for the use of chemotherapy given his borderline kidney function, baseline cytopenias and other comorbid conditions.  I have recommended proceeding with primary surgical therapy and consider adjuvant chemotherapy afterwards depending on the pathology.  2.  Thrombocytopenia: His count is mildly decreased at baseline but more down because of his recent infection.  I recommended continued observation at this time without any intervention.  3.  Renal insufficiency: Likely related to his hydronephrosis which should improve with nephrostomy tube.

## 2017-11-29 NOTE — ED Notes (Signed)
Assigned 1507 @ 7:13 call report @ 7:33

## 2017-11-29 NOTE — Progress Notes (Signed)
PT Cancellation Note  Patient Details Name: MEER REINDL MRN: [id number] DOB: [birth year]   Cancelled Treatment:    Reason Eval/Treat Not Completed: PT screened, no needs identified, will sign off, patient and wife indicate that patient does not require assistance and is ambulating.    [name] 11/29/2017, 4:14 PM  Tresa Endo PT [phone]

## 2017-11-29 NOTE — Progress Notes (Signed)
Referring Physician(s): Keene Breath  Supervising Physician: Sandi Mariscal  Patient Status:  [facility] - In-pt  Chief Complaint:  Left flank pain, fever, left hydronephrosis  Subjective:  Pt feeling better this am; denies flank pain,N/V; afebrile; does have constipation  Allergies: Antihistamines, loratadine-type and Statins  Medications: Prior to Admission medications   Medication Sig Start Date End Date Taking? Authorizing Provider  acetaminophen (TYLENOL) 500 MG tablet Take 1,000 mg by mouth every 8 (eight) hours as needed for mild pain or moderate pain.   Yes [provider]  aspirin 81 MG tablet Take 81 mg by mouth daily.     Yes [provider]  carvedilol (COREG) 6.25 MG tablet Take 1 tablet (6.25 mg total) 2 (two) times daily with a meal by mouth. 11/09/17  Yes Larey Dresser, MD  diphenhydramine-acetaminophen (TYLENOL PM EXTRA STRENGTH) 25-500 MG TABS tablet Take 1 tablet by mouth at bedtime as needed.    Yes [provider]  Hydrocortisone Acetate (HEMORRHOIDAL-HC RE) Place 1 application rectally as needed (hemorrhoids).   Yes [provider]  isosorbide mononitrate (IMDUR) 60 MG 24 hr tablet Take 1 tablet (60 mg total) by mouth daily. 10/28/17 01/26/18 Yes End, Harrell Gave, MD  lubiprostone (AMITIZA) 24 MCG capsule Take 1 capsule (24 mcg total) by mouth 2 (two) times daily with a meal. 11/23/17  Yes Pyrtle, Lajuan Lines, MD  magnesium citrate SOLN Take 1 Bottle by mouth once.   Yes [provider]  nitroGLYCERIN (NITROSTAT) 0.4 MG SL tablet Place 1 tablet (0.4 mg total) under the tongue every 5 (five) minutes as needed. Patient taking differently: Place 0.4 mg under the tongue every 5 (five) minutes as needed for chest pain.  08/19/17 11/28/17 Yes Dunn, Dayna N, PA-C  Plant Sterols and Stanols (CHOLESTOFF PO) Take 2 tablets by mouth 2 (two) times daily.   Yes [provider]  polyethylene glycol (MIRALAX / GLYCOLAX) packet Take 17 g by  mouth daily as needed for moderate constipation.   Yes [provider]  pramipexole (MIRAPEX) 1 MG tablet TAKE 1 AND 1/2 TABLETS BY MOUTH AT BEDTIME 06/29/17  Yes [name], MD  ranitidine (ZANTAC) 300 MG tablet TAKE 1 TABLET BY MOUTH TWICE A DAY 10/04/17  Yes Pyrtle, Lajuan Lines, MD  Tetrahydroz-Glyc-Hyprom-PEG (VISINE MAXIMUM REDNESS RELIEF OP) Place 2 drops into both eyes 2 (two) times daily as needed (dry eyes).    Yes [provider]  traMADol (ULTRAM) 50 MG tablet Take 50 mg by mouth every 6 (six) hours as needed for moderate pain.    Yes [provider]  linaclotide (LINZESS) 290 MCG CAPS capsule TAKE 1 CAPSULE (290 MCG TOTAL) BY MOUTH DAILY. [place] Patient not taking: Reported on 11/24/2017 03/30/17   Pyrtle, Lajuan Lines, MD     Vital Signs: BP (!) 141/69 (BP Location: Right Arm)   Pulse 62   Temp 98.7 F (37.1 C) (Oral)   Resp 14   SpO2 95%   Physical Exam awake/alert; left PCN intact, dressing dry, site NT, output about 200 cc bloody urine in bag  Imaging: Ct Abdomen Pelvis Wo Contrast  Result Date: 11/28/2017 CLINICAL DATA:  Family reports pt had renal and bladder biopsies on Thursday or Friday. Family reports pt has had a fever at home, nausea, dysuria, and AMS. Denies any antipyretic use today. [name]. EXAM: CT ABDOMEN AND PELVIS WITHOUT CONTRAST TECHNIQUE: Multidetector CT imaging of the abdomen and  pelvis was performed following the standard protocol without IV contrast. COMPARISON:  11/11/2017 FINDINGS: Lower chest: Small right and minimal left pleural effusions. Pleural effusions are new since the prior CT. Mild interstitial thickening at the lung bases as well as subsegmental atelectasis, greater on the right. Interstitial thickening is similar to the prior CT. No convincing pneumonia. Hepatobiliary: No focal liver abnormality is seen. No gallstones, gallbladder wall thickening, or biliary dilatation.  Pancreas: Unremarkable. No pancreatic ductal dilatation or surrounding inflammatory changes. Spleen: Normal in size without focal abnormality. Adrenals/Urinary Tract: Residual contrast fills the moderately dilated left intrarenal collecting system and portions of the left ureter. There is mild dilation of the right intrarenal collecting system and portions of the right ureter. There is diffuse left renal cortical thinning. No renal masses. No visualized stones. No adrenal masses. Irregular thickening along the posterior aspect of the bladder, greater on the left, is similar to the prior CT. Stomach/Bowel: Stomach is unremarkable. The small bowel and colon show air-fluid levels, but are nondilated with no wall thickening or adjacent inflammation. This is consistent with a mild diffuse adynamic ileus. No evidence of obstruction. Mild colonic stool burden. Appendix not visualized. Vascular/Lymphatic: Aortic atherosclerosis. No enlarged abdominal or pelvic lymph nodes. Reproductive: Prostate gland is enlarged this, bulging against the posterior inferior bladder base, stable from the recent CT. Other: No abdominal wall hernia or abnormality. No abdominopelvic ascites. Musculoskeletal: No fracture or acute finding. No osteoblastic or osteolytic lesions. Stable changes from a previous L3-L4 posterior lumbar spine fusion. Chronic bilateral pars defects with a grade 1 anterolisthesis at L5-S1. IMPRESSION: 1. Moderate left hydronephrosis. There is residual contrast filling of the left intrarenal collecting system and portions of the left ureter. This is consistent with moderate obstructive uropathy, which appears due to an area of irregular bladder wall thickening or mass crossing the left ureteral trigone. 2. Mild right hydronephrosis. Right hydronephrosis has increased from the previous CT scan. Left hydronephrosis is unchanged. There is left perinephric and peri ureteral stranding, which is similar to the prior exam which  may be the result of the obstruction only, but infection should also be considered. 3. Small right and minimal left pleural effusions. Mild lung base interstitial thickening. Consider mild congestive heart failure given the symptoms of shortness of breath. Electronically Signed   By: Lajean Manes M.D.   On: 11/28/2017 16:18   Dg Chest Port 1 View  Result Date: 11/28/2017 CLINICAL DATA:  Per order- SOBHis chronic constipation has worsened over the past several days. He has also felt very poorly, unable to get out of bed today and low-grade fevers. Pt states Sob today. EXAM: PORTABLE CHEST 1 VIEW COMPARISON:  None. FINDINGS: There changes from prior cardiac surgery. The cardiac silhouette is normal in size and configuration. No mediastinal or hilar masses. No convincing adenopathy. Clear lungs.  No pleural effusion or pneumothorax. Skeletal structures are intact. IMPRESSION: No active disease. Electronically Signed   By: Lajean Manes M.D.   On: 11/28/2017 15:59   Acute Abdominal Series  Result Date: 11/29/2017 CLINICAL DATA:  Initial evaluation for constipation for 1 week. EXAM: DG ABDOMEN ACUTE W/ 1V CHEST COMPARISON:  Prior CT from 11/28/2017. FINDINGS: Median sternotomy wires underlying cardiomegaly. Mediastinal silhouette normal. Lungs hypoinflated. Mild diffuse pulmonary interstitial congestion without frank alveolar edema. No other focal infiltrates. No pleural effusion. No pneumothorax. Few scattered gas-filled loops of bowel seen throughout the abdomen without evidence for obstruction or ileus. No free air seen on lateral decubitus view. No  significant bowel wall thickening. Overall stool burden is relatively mild. Soft tissue mass or abnormal calcification. Left-sided nephrostomy tube noted. Dextroscoliosis with postsurgical changes noted within the lower lumbar spine. IMPRESSION: 1. Nonobstructive bowel gas pattern with no radiographic evidence for acute intra-abdominal pathology. Overall stool  burden is mild. 2. Percutaneous nephrostomy tube in place. 3. Stable cardiomegaly with mild diffuse pulmonary interstitial congestion without frank pulmonary edema. Electronically Signed   By: Jeannine Boga M.D.   On: 11/29/2017 01:00   Dg C-arm 1-60 Min-no Report  Result Date: 11/25/2017 Fluoroscopy was utilized by the requesting physician.  No radiographic interpretation.    Labs:  CBC: Recent Labs    09/15/17 1420 11/24/17 0813 11/28/17 1518 11/29/17 0540  WBC 5.8 5.3 9.9 6.8  HGB 10.0* 10.1* 9.4* 8.1*  HCT 29.5* 29.8* 27.5* 24.3*  PLT 114 Large platelets present* 82* 72* 59*    COAGS: Recent Labs    08/24/17 0948  INR 1.0    BMP: Recent Labs    11/24/17 0813 11/26/17 0526 11/28/17 1518 11/29/17 0540  NA 139 137 133* 134*  K 4.3 5.3* 4.3 4.4  CL 111 108 104 105  CO2 22 22 20* 21*  GLUCOSE 103* 157* 100* 98  BUN 19 19 24* 26*  CALCIUM 9.4 9.3 9.1 8.5*  CREATININE 1.57* 1.58* 1.56* 1.55*  GFRNONAA 40* 40* 40* 41*  GFRAA 46* 46* 47* 47*    LIVER FUNCTION TESTS: Recent Labs    12/01/16 0827 09/15/17 1420 11/28/17 1518  BILITOT 0.6  --  1.1  AST 15  --  23  ALT 15  --  18  ALKPHOS 40  --  51  PROT 6.4 6.2 6.7  ALBUMIN 4.1  --  4.0    Assessment and Plan: Pt with hx urothelial carcinoma with bilateral left greater than right hydronephrosis/malignant obstruction, recent fever/flank pain; s/p left percutaneous nephrostomy on 12/2; currently afebrile, blood/urine cx pend; WBC 6.8, hemoglobin 8.1 (9.4), platelets 59k, creat 1.55(1.56); bloody PCN output; monitor labs carefully; gently irrigate PCN prn;  await oncology input for tx; other plans as per urology;?JJ stent attempt at later date   Electronically Signed: D. Rowe Robert, PA-C 11/29/2017, 10:52 AM   I spent a total of 15 minutes at the the patient's bedside AND on the patient's hospital floor or unit, greater than 50% of which was counseling/coordinating care for left  nephrostomy    Patient ID: Charles Coffey, male   DOB: Jun 14, 1935, 82 y.o.   MRN: [id number]

## 2017-11-29 NOTE — Telephone Encounter (Signed)
Spoke with pts wife and is in the hospital. Had infection, drain was placed. Dr. Hilarie Fredrickson notified.

## 2017-11-30 ENCOUNTER — Inpatient Hospital Stay (HOSPITAL_COMMUNITY): Payer: Medicare HMO

## 2017-11-30 ENCOUNTER — Encounter (HOSPITAL_COMMUNITY): Payer: Self-pay | Admitting: Interventional Radiology

## 2017-11-30 DIAGNOSIS — K581 Irritable bowel syndrome with constipation: Secondary | ICD-10-CM

## 2017-11-30 DIAGNOSIS — E538 Deficiency of other specified B group vitamins: Secondary | ICD-10-CM | POA: Diagnosis present

## 2017-11-30 DIAGNOSIS — K624 Stenosis of anus and rectum: Secondary | ICD-10-CM

## 2017-11-30 DIAGNOSIS — C679 Malignant neoplasm of bladder, unspecified: Secondary | ICD-10-CM

## 2017-11-30 LAB — URINE CULTURE
Culture: <10000 — AB
Culture: NO GROWTH

## 2017-11-30 LAB — BASIC METABOLIC PANEL
ANION GAP: 7 (ref 5–15)
BUN: 24 mg/dL — AB (ref 6–20)
CALCIUM: 9 mg/dL (ref 8.9–10.3)
CO2: 22 mmol/L (ref 22–32)
CREATININE: 1.49 mg/dL — AB (ref 0.61–1.24)
Chloride: 103 mmol/L (ref 101–111)
GFR calc Af Amer: 49 mL/min — ABNORMAL LOW (ref >60)
GFR, EST NON AFRICAN AMERICAN: 43 mL/min — AB (ref >60)
GLUCOSE: 114 mg/dL — AB (ref 65–99)
POTASSIUM: 4.3 mmol/L (ref 3.5–5.1)
SODIUM: 132 mmol/L — AB (ref 135–145)

## 2017-11-30 LAB — HAPTOGLOBIN: Haptoglobin: 233 mg/dL — ABNORMAL HIGH (ref 34–200)

## 2017-11-30 LAB — CBC
HCT: 27.1 % — ABNORMAL LOW (ref 39.0–52.0)
HEMOGLOBIN: 9.2 g/dL — AB (ref 13.0–17.0)
MCH: 30.7 pg (ref 26.0–34.0)
MCHC: 33.9 g/dL (ref 30.0–36.0)
MCV: 90.3 fL (ref 78.0–100.0)
Platelets: 60 10*3/uL — ABNORMAL LOW (ref 150–400)
RBC: 3 MIL/uL — AB (ref 4.22–5.81)
RDW: 16.1 % — ABNORMAL HIGH (ref 11.5–15.5)
WBC: 6.8 10*3/uL (ref 4.0–10.5)

## 2017-11-30 LAB — GLUCOSE, CAPILLARY: GLUCOSE-CAPILLARY: 107 mg/dL — AB (ref 65–99)

## 2017-11-30 MED ORDER — PEG-KCL-NACL-NASULF-NA ASC-C 100 G PO SOLR: 1.0000 | Freq: Once | ORAL | Status: DC

## 2017-11-30 MED ORDER — POLYETHYLENE GLYCOL 3350 17 G PO PACK
17.0000 g | PACK | Freq: Two times a day (BID) | ORAL | Status: DC
  Administered 2017-11-30: 17 g via ORAL
  Filled 2017-11-30: qty 1

## 2017-11-30 MED ORDER — METOCLOPRAMIDE HCL 5 MG/ML IJ SOLN
10.0000 mg | Freq: Four times a day (QID) | INTRAMUSCULAR | Status: DC
  Administered 2017-11-30: 10 mg via INTRAVENOUS
  Filled 2017-11-30 (×2): qty 2

## 2017-11-30 MED ORDER — CYANOCOBALAMIN 1000 MCG/ML IJ SOLN
1000.0000 ug | Freq: Every day | INTRAMUSCULAR | Status: DC
  Administered 2017-11-30: 1000 ug via INTRAMUSCULAR
  Filled 2017-11-30 (×2): qty 1

## 2017-11-30 MED ORDER — PEG-KCL-NACL-NASULF-NA ASC-C 100 G PO SOLR: 0.5000 | Freq: Once | ORAL | Status: DC

## 2017-11-30 MED ORDER — PEG-KCL-NACL-NASULF-NA ASC-C 100 G PO SOLR
0.5000 | Freq: Once | ORAL | Status: AC
  Administered 2017-11-30: 100 g via ORAL
  Filled 2017-11-30: qty 1

## 2017-11-30 NOTE — Consult Note (Signed)
Consultation  Referring Provider:  Dr. Grandville Silos    Primary Care Physician:  [name], MD Primary Gastroenterologist: Dr. Hilarie Fredrickson    Reason for Consultation:  Ileus, Constipation, IBS, lower abdominal pain         HPI:   Charles Coffey is a 82 y.o. male with a past medical history as listed below including invasive urothelial carcinoma involving the left urethra and trigone, status post recent cystoscopy and left retrograde pyelogram with TURBT on 11/25/17, who initially presented to the ED on 11/29/17 for fever, constipation and moderate bilateral hydronephrosis.  We were consulted today in regards to the patient's ongoing constipation complaints as well as lower abdominal pain.    Per review of chart patient was seen in clinic 07/20/17 by Dr. Hilarie Fredrickson and at that time discussed his chronic reflux as well as constipation.  He was using Linzess 290 mcg daily and was having a daily bowel movement.  Patient described that if he skipped Linzess he often felt constipated with abdominal bloating.  He was continued on Linzess.  Patient then called our clinic 11/23/17 and described the Linzess has stopped working.  He was changed Amitiza 24 mcg twice daily.  Patient's wife called our clinic 11/28/17 and described worsening constipation.  He had undergone a urological procedure 11/29 and tried one bottle of magnesium citrate and was continuing his Amitiza.  He was instructed to use magnesium citrate again.  Patient had also done a MiraLAX prep.     Today, the patient and his wife are very concerned regarding the patient in general.  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  In general they feel as though his medical team is moving quite slowly regarding his cancer and plans to take care of this and they are frustrated.    In regards to the patient's GI complaints, e tells me that he has had only very small liquid bowel  movements and is only passing a very small amount of gas.  They explained that even before he was in the hospital he was battling constipation.  They describe his history of being on Linzess 290 mcg daily which seemed to work within an hour and a half of taking it, per the patient, but stopped working/slowed down after about 2-3 months of this medication.  Patient was then switched to Amitiza 24 mcg which he tells me "never really helped".  Patient was then seen as above for his cystoscopy and surgery with a finding of invasive urethral carcinoma.  In the midst of this patient was not having good bowel movements.  When he presented to the hospital CT showed ileus.  Patient tells me he has not had a good bowel movement in about 2 weeks.  Currently, he is taking "whatever medicines they give me", other than the fact the patient tells me he refused his MiraLAX this morning as he felt as though this was making him more bloated.  Patient does tell me the Dulcolax suppository yesterday did "help me a little".  Patient has just started eating a soft diet today, prior to this he was on liquids.  Patient is also on pain meds including morphine.    Patient also describes an accompanying lower abdominal cramping sensation.  Apparently this has been worse ever since Sunday and his admission to the hospital.  He has not experienced this at all today, but  last night it seemed to keep him awake.  Patient describes that this will "come in waves".  Patient also experiences some bloating.    Also describes rectal pain, worse with passing a bowel movement.  He describes this as sharp in nature.  He tells me that this is "just my hemorrhoids".  He denies any rectal bleeding.    Patient denies any continued fever or chills, blood in his stool or melena.     ED course: Temp 101.8, CBC with a hemoglobin of 9.4, platelet count of 72 and otherwise normal, CMP with a bicarb of 20, BUN of 24 and creatinine of 1.56.  Lactic acid level is  0.72.  EKG showed right bundle branch block and left anterior fascicular block.  CT abdomen pelvis showed moderate left hydronephrosis with residual contrast left intrarenal collecting system and portions of the left ureter consistent with moderate obstructive uropathy which appeared due to area of irregular bladder wall thickening or mass crossing the left urethral trigone.  Mild right hydronephrosis.  Left hydronephrosis.  CT also showed small bowel and colon with air-fluid levels, but nondilated with no wall thickening or adjacent inflammation.  This is consistent with a mild diffuse adynamic ileus.  There is only mild colonic stool burden.  Urology was consulted as well as IR.  Hospital course: 11/28/17 patient had a left percutaneous nephrostomy tube placed by IR.  He was started on antibiotics.  Patient is being followed by urology as well as oncology. Abdominal x-ray on 11/29/17 showed nonobstructive bowel gas pattern with no radiographic evidence for acute intra-abdominal pathology.  Overall stool burden was mild.  Percutaneous nephrostomy tube.  Stable cardiomegaly with mild diffuse pulmonary interstitial congestion without frank pulmonary edema.  GI history: 10/17/14-colonoscopy, Dr. Hilarie Fredrickson: Impression: 5 sessile polyps ranging from 2-7 mm in size in the ascending and transverse colon, mild diverticulosis in the sigmoid colon and melanosis coli; repeat was recommended in 3 years.  Past Medical History:  Diagnosis Date  . Aortic atherosclerosis ([place])   . BPH with urinary obstruction   . CAD (coronary artery disease)    a.  MI 1995, CABG x 3 2002 (patient says that he had LIMA and RIMA grafts). b. ETT-Cardiolite (10/15) with EF 48%, apical scar, no ischemia. c. Nuc 07/2017 abnormal -> cath was performed,  patent LIMA to LAD and SVG to OM 2. RIMA to RCA is atretic. The right coronary artery is occluded distally with extensive collaterals from the LAD, medical therapy.   . Cervical spondylosis  without myelopathy November 05, 202017  . Chronic low back pain    sees Dr. Kary Kos   . GERD (gastroesophageal reflux disease)   . Hiatal hernia   . Hyperlipidemia   . Hypertension   . IBS (irritable bowel syndrome)   . Ischemic cardiomyopathy   . Myocardial infarction ([place])   . RBBB   . Restless legs syndrome   . Statin intolerance   . Syncope    a. in 2015 - no apparent cause, was taking sleep medicine at the time. Cardiac workup unremarkable.  . Tubular adenoma of colon     Past Surgical History:  Procedure Laterality Date  . COLONOSCOPY  10/17/2014   per Dr. Hilarie Fredrickson, tubular adenomas, repeat in 3 yrs   . CYSTOSCOPY W/ RETROGRADES Left 11/25/2017   Procedure: CYSTOSCOPY WITH RETROGRADE PYELOGRAM;  Surgeon: Irine Seal, MD;  Location: [facility];  Service: Urology;  Laterality: Left;  . HEART BYPASS    . IR NEPHROSTOMY PLACEMENT  LEFT  11/28/2017  . LEFT HEART CATH AND CORS/GRAFTS ANGIOGRAPHY N/A 08/25/2017   Procedure: LEFT HEART CATH AND CORS/GRAFTS ANGIOGRAPHY;  Surgeon: Wellington Hampshire, MD;  Location: [facility];  Service: Cardiovascular;  Laterality: N/A;  . LUMBAR FUSION  2003   L3-L4  . PROSTATE SURGERY  06-27-12   per Dr. Roni Bread, had CTT  . TONSILLECTOMY    . TRANSURETHRAL RESECTION OF BLADDER TUMOR N/A 11/25/2017   Procedure: TRANSURETHRAL RESECTION OF BLADDER TUMOR (TURBT);  Surgeon: Irine Seal, MD;  Location: [facility];  Service: Urology;  Laterality: N/A;    Family History  Problem Relation Age of Onset  . Heart disease Mother   . Heart attack Mother   . Aneurysm Father        femoral artery  . Heart disease Brother   . Heart disease Maternal Uncle        x 2  . Colon cancer Neg Hx   . Esophageal cancer Neg Hx   . Pancreatic cancer Neg Hx   . Kidney disease Neg Hx   . Liver disease Neg Hx      Social History   Tobacco Use  . Smoking status: Former Smoker    Types: Cigarettes    Last attempt to quit: 12/28/1978    Years since quitting: 38.9  . Smokeless  tobacco: Never Used  Substance Use Topics  . Alcohol use: Yes    Alcohol/week: 0.0 oz    Comment: occ-  . Drug use: No    Prior to Admission medications   Medication Sig Start Date End Date Taking? Authorizing Provider  acetaminophen (TYLENOL) 500 MG tablet Take 1,000 mg by mouth every 8 (eight) hours as needed for mild pain or moderate pain.   Yes [provider]  aspirin 81 MG tablet Take 81 mg by mouth daily.     Yes [provider]  carvedilol (COREG) 6.25 MG tablet Take 1 tablet (6.25 mg total) 2 (two) times daily with a meal by mouth. 11/09/17  Yes Larey Dresser, MD  diphenhydramine-acetaminophen (TYLENOL PM EXTRA STRENGTH) 25-500 MG TABS tablet Take 1 tablet by mouth at bedtime as needed.    Yes [provider]  Hydrocortisone Acetate (HEMORRHOIDAL-HC RE) Place 1 application rectally as needed (hemorrhoids).   Yes [provider]  isosorbide mononitrate (IMDUR) 60 MG 24 hr tablet Take 1 tablet (60 mg total) by mouth daily. 10/28/17 01/26/18 Yes End, Harrell Gave, MD  lubiprostone (AMITIZA) 24 MCG capsule Take 1 capsule (24 mcg total) by mouth 2 (two) times daily with a meal. 11/23/17  Yes Pyrtle, Lajuan Lines, MD  magnesium citrate SOLN Take 1 Bottle by mouth once.   Yes [provider]  nitroGLYCERIN (NITROSTAT) 0.4 MG SL tablet Place 1 tablet (0.4 mg total) under the tongue every 5 (five) minutes as needed. Patient taking differently: Place 0.4 mg under the tongue every 5 (five) minutes as needed for chest pain.  08/19/17 11/28/17 Yes Dunn, Dayna N, PA-C  Plant Sterols and Stanols (CHOLESTOFF PO) Take 2 tablets by mouth 2 (two) times daily.   Yes [provider]  polyethylene glycol (MIRALAX / GLYCOLAX) packet Take 17 g by mouth daily as needed for moderate constipation.   Yes [provider]  pramipexole (MIRAPEX) 1 MG tablet TAKE 1 AND 1/2 TABLETS BY MOUTH AT BEDTIME 06/29/17  Yes [name], MD  ranitidine (ZANTAC) 300 MG  tablet TAKE 1 TABLET BY MOUTH TWICE A DAY 10/04/17  Yes  Pyrtle, Lajuan Lines, MD  Tetrahydroz-Glyc-Hyprom-PEG [place] MAXIMUM REDNESS RELIEF OP) Place 2 drops into both eyes 2 (two) times daily as needed (dry eyes).    Yes [provider]  traMADol (ULTRAM) 50 MG tablet Take 50 mg by mouth every 6 (six) hours as needed for moderate pain.    Yes [provider]  linaclotide (LINZESS) 290 MCG CAPS capsule TAKE 1 CAPSULE (290 MCG TOTAL) BY MOUTH DAILY. [place] A MEAL Patient not taking: Reported on 11/24/2017 03/30/17   Pyrtle, Lajuan Lines, MD    Current Facility-Administered Medications  Medication Dose Route Frequency Provider Last Rate Last Dose  . acetaminophen (TYLENOL) tablet 650 mg  650 mg Oral Q6H PRN Eugenie Filler, MD       Or  . acetaminophen (TYLENOL) suppository 650 mg  650 mg Rectal Q6H PRN Eugenie Filler, MD      . albuterol (PROVENTIL) (2.5 MG/3ML) 0.083% nebulizer solution 2.5 mg  2.5 mg Nebulization Q2H PRN Eugenie Filler, MD      . bisacodyl (DULCOLAX) suppository 10 mg  10 mg Rectal Daily PRN Eugenie Filler, MD      . carvedilol (COREG) tablet 6.25 mg  6.25 mg Oral BID WC Eugenie Filler, MD   6.25 mg at 11/30/17 3151  . cefTRIAXone (ROCEPHIN) 2 g in dextrose 5 % 50 mL IVPB  2 g Intravenous Q24H Eugenie Filler, MD   Stopped at 11/30/17 [phone]  . cyanocobalamin ((VITAMIN B-12)) injection 1,000 mcg  1,000 mcg Intramuscular Daily Eugenie Filler, MD      . famotidine (PEPCID) tablet 20 mg  20 mg Oral BID Eugenie Filler, MD   20 mg at 11/30/17 0737  . hydrocortisone (ANUSOL-HC) suppository 25 mg  25 mg Rectal PRN Eugenie Filler, MD      . isosorbide mononitrate (IMDUR) 24 hr tablet 60 mg  60 mg Oral Daily Eugenie Filler, MD   60 mg at 11/30/17 1062  . lubiprostone (AMITIZA) capsule 24 mcg  24 mcg Oral BID WC Gardiner Barefoot, NP   24 mcg at 11/30/17 0904  . morphine 4 MG/ML injection 2 mg  2 mg Intravenous Q4H PRN Eugenie Filler, MD   2 mg at 11/30/17 1004  . nitroGLYCERIN (NITROSTAT) SL tablet 0.4 mg  0.4 mg Sublingual Q5 min PRN Eugenie Filler, MD      . ondansetron [facility]) tablet 4 mg  4 mg Oral Q6H PRN Eugenie Filler, MD       Or  . ondansetron [facility]) injection 4 mg  4 mg Intravenous Q6H PRN Eugenie Filler, MD      . oxyCODONE (Oxy IR/ROXICODONE) immediate release tablet 5 mg  5 mg Oral Q4H PRN Eugenie Filler, MD      . pantoprazole (PROTONIX) injection 40 mg  40 mg Intravenous Q24H Eugenie Filler, MD   40 mg at 11/29/17 2154  . polyethylene glycol (MIRALAX / GLYCOLAX) packet 17 g  17 g Oral BID Eugenie Filler, MD      . pramipexole (MIRAPEX) tablet 0.25 mg  0.25 mg Oral TID Eugenie Filler, MD   0.25 mg at 11/30/17 6948  . senna (SENOKOT) tablet 8.6 mg  1 tablet Oral BID Eugenie Filler, MD   8.6 mg at 11/30/17 5462  . sodium chloride flush (NS) 0.9 % injection 5 mL  5 mL Intravenous Q8H Aletta Edouard, MD   5 mL  at 11/30/17 1126  . vancomycin (VANCOCIN) IVPB 1000 mg/200 mL premix  1,000 mg Intravenous Q24H Dara Hoyer, RPH 200 mL/hr at 11/30/17 0057 1,000 mg at 11/30/17 0057    Allergies as of 11/28/2017 - Review Complete 11/28/2017  Allergen Reaction Noted  . Antihistamines, loratadine-type Other (See Comments) 11/25/2011  . Statins Other (See Comments) 10/15/2010     Review of Systems:    Constitutional: No current fever or chills Skin: No rash  Cardiovascular: No chest pain Respiratory: No SOB  Gastrointestinal: See HPI and otherwise negative Genitourinary: No dysuria  Neurological: No headache, dizziness or syncope Musculoskeletal: No new muscle or joint pain Hematologic: No bruising Psychiatric: No history of depression or anxiety   Physical Exam:  Vital signs in last 24 hours: Temp:  [97.9 F (36.6 C)-98.3 F (36.8 C)] 98.2 F (36.8 C) (12/04 0553) Pulse Rate:  [56-64] 60 (12/04 0553) Resp:  [14-22] 20 (12/04 0553) BP: (104-118)/(65-83)  104/83 (12/04 0553) SpO2:  [97 %-98 %] 98 % (12/04 0553) Weight:  [194 lb 0.1 oz (88 kg)] 194 lb 0.1 oz (88 kg) (12/04 1200) Last BM Date: (2 weeks ago per pt) General:   Pleasant Caucasian male appears to be in NAD, Well developed, Well nourished, alert and cooperative Head:  Normocephalic and atraumatic. Eyes:   PEERL, EOMI. No icterus. Conjunctiva pink. Ears:  Normal auditory acuity. Neck:  Supple Throat: Oral cavity and pharynx without inflammation, swelling or lesion.  Lungs: Respirations even and unlabored. Lungs clear to auscultation bilaterally.   No wheezes, crackles, or rhonchi.  Heart: Normal S1, S2. No MRG. Regular rate and rhythm. No peripheral edema, cyanosis or pallor.  Abdomen:  Soft, nondistended, mild b/l lower ttp. No rebound or guarding. Normal bowel sounds. No appreciable masses or hepatomegaly. Rectal: Patient declined Msk:  Symmetrical without gross deformities. Peripheral pulses intact.  Extremities:  Without edema, no deformity or joint abnormality.  Neurologic:  Alert and  oriented x4;  grossly normal neurologically.  Skin:   Dry and intact without significant lesions or rashes. Psychiatric:  Demonstrates good judgement and reason without abnormal affect or behaviors.   LAB RESULTS: Recent Labs    11/28/17 1518 11/29/17 0540 11/30/17 0555  WBC 9.9 6.8 6.8  HGB 9.4* 8.1* 9.2*  HCT 27.5* 24.3* 27.1*  PLT 72* 59* 60*   BMET Recent Labs    11/28/17 1518 11/29/17 0540 11/30/17 0555  NA 133* 134* 132*  K 4.3 4.4 4.3  CL 104 105 103  CO2 20* 21* 22  GLUCOSE 100* 98 114*  BUN 24* 26* 24*  CREATININE 1.56* 1.55* 1.49*  CALCIUM 9.1 8.5* 9.0   LFT Recent Labs    11/28/17 1518  PROT 6.7  ALBUMIN 4.0  AST 23  ALT 18  ALKPHOS 51  BILITOT 1.1   STUDIES: Ct Abdomen Pelvis Wo Contrast  Result Date: 11/28/2017 CLINICAL DATA:  Family reports pt had renal and bladder biopsies on Thursday or Friday. Family reports pt has had a fever at home, nausea,  dysuria, and AMS. Denies any antipyretic use today. [name]. EXAM: CT ABDOMEN AND PELVIS WITHOUT CONTRAST TECHNIQUE: Multidetector CT imaging of the abdomen and pelvis was performed following the standard protocol without IV contrast. COMPARISON:  11/11/2017 FINDINGS: Lower chest: Small right and minimal left pleural effusions. Pleural effusions are new since the prior CT. Mild interstitial thickening at the lung bases as well as subsegmental atelectasis, greater on the right. Interstitial thickening is similar  to the prior CT. No convincing pneumonia. Hepatobiliary: No focal liver abnormality is seen. No gallstones, gallbladder wall thickening, or biliary dilatation. Pancreas: Unremarkable. No pancreatic ductal dilatation or surrounding inflammatory changes. Spleen: Normal in size without focal abnormality. Adrenals/Urinary Tract: Residual contrast fills the moderately dilated left intrarenal collecting system and portions of the left ureter. There is mild dilation of the right intrarenal collecting system and portions of the right ureter. There is diffuse left renal cortical thinning. No renal masses. No visualized stones. No adrenal masses. Irregular thickening along the posterior aspect of the bladder, greater on the left, is similar to the prior CT. Stomach/Bowel: Stomach is unremarkable. The small bowel and colon show air-fluid levels, but are nondilated with no wall thickening or adjacent inflammation. This is consistent with a mild diffuse adynamic ileus. No evidence of obstruction. Mild colonic stool burden. Appendix not visualized. Vascular/Lymphatic: Aortic atherosclerosis. No enlarged abdominal or pelvic lymph nodes. Reproductive: Prostate gland is enlarged this, bulging against the posterior inferior bladder base, stable from the recent CT. Other: No abdominal wall hernia or abnormality. No abdominopelvic ascites. Musculoskeletal: No fracture or acute finding. No  osteoblastic or osteolytic lesions. Stable changes from a previous L3-L4 posterior lumbar spine fusion. Chronic bilateral pars defects with a grade 1 anterolisthesis at L5-S1. IMPRESSION: 1. Moderate left hydronephrosis. There is residual contrast filling of the left intrarenal collecting system and portions of the left ureter. This is consistent with moderate obstructive uropathy, which appears due to an area of irregular bladder wall thickening or mass crossing the left ureteral trigone. 2. Mild right hydronephrosis. Right hydronephrosis has increased from the previous CT scan. Left hydronephrosis is unchanged. There is left perinephric and peri ureteral stranding, which is similar to the prior exam which may be the result of the obstruction only, but infection should also be considered. 3. Small right and minimal left pleural effusions. Mild lung base interstitial thickening. Consider mild congestive heart failure given the symptoms of shortness of breath. Electronically Signed   By: Lajean Manes M.D.   On: 11/28/2017 16:18   Dg Chest Port 1 View  Result Date: 11/28/2017 CLINICAL DATA:  Per order- SOBHis chronic constipation has worsened over the past several days. He has also felt very poorly, unable to get out of bed today and low-grade fevers. Pt states Sob today. EXAM: PORTABLE CHEST 1 VIEW COMPARISON:  None. FINDINGS: There changes from prior cardiac surgery. The cardiac silhouette is normal in size and configuration. No mediastinal or hilar masses. No convincing adenopathy. Clear lungs.  No pleural effusion or pneumothorax. Skeletal structures are intact. IMPRESSION: No active disease. Electronically Signed   By: Lajean Manes M.D.   On: 11/28/2017 15:59   Acute Abdominal Series  Result Date: 11/29/2017 CLINICAL DATA:  Initial evaluation for constipation for 1 week. EXAM: DG ABDOMEN ACUTE W/ 1V CHEST COMPARISON:  Prior CT from 11/28/2017. FINDINGS: Median sternotomy wires underlying cardiomegaly.  Mediastinal silhouette normal. Lungs hypoinflated. Mild diffuse pulmonary interstitial congestion without frank alveolar edema. No other focal infiltrates. No pleural effusion. No pneumothorax. Few scattered gas-filled loops of bowel seen throughout the abdomen without evidence for obstruction or ileus. No free air seen on lateral decubitus view. No significant bowel wall thickening. Overall stool burden is relatively mild. Soft tissue mass or abnormal calcification. Left-sided nephrostomy tube noted. Dextroscoliosis with postsurgical changes noted within the lower lumbar spine. IMPRESSION: 1. Nonobstructive bowel gas pattern with no radiographic evidence for acute intra-abdominal pathology. Overall stool burden is mild. 2. Percutaneous  nephrostomy tube in place. 3. Stable cardiomegaly with mild diffuse pulmonary interstitial congestion without frank pulmonary edema. Electronically Signed   By: Jeannine Boga M.D.   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 Status post retrograde urography on 11/25/2017. At that time, contrast injection was performed but a ureteral stent could not be advanced to the level of the kidney. He now presents with increasing flank and back pain as well as fever with evidence of developing urosepsis. By CT, there is retained contrast material in the left renal collecting system that demonstrates significant hydronephrosis. EXAM: LEFT PERCUTANEOUS NEPHROSTOMY TUBE PLACEMENT. COMPARISON:  CT of the abdomen on 11/28/2017 and imaging from retrograde urography study on 11/25/2017 ANESTHESIA/SEDATION: 1.0 mg IV Versed; 50 mcg IV Fentanyl. Total Moderate Sedation Time 6 minutes. The patient's level of consciousness and physiologic status were continuously monitored during the procedure by Radiology  nursing. CONTRAST:  15 mL Isovue-300 injected into the renal collecting system. MEDICATIONS: IV vancomycin was being infused at the time of the procedure. FLUOROSCOPY TIME:  1 minute and 42 seconds.  72 mGy. PROCEDURE: The procedure, risks, benefits, and alternatives were explained to the patient. Questions regarding the procedure were encouraged and answered. The patient understands and consents to the procedure. A time-out was performed prior to initiating the procedure. The left flank region was prepped with chlorhexidine in a sterile fashion, and a sterile drape was applied covering the operative field. A sterile gown and sterile gloves were used for the procedure. Local anesthesia was provided with 1% Lidocaine. Under fluoroscopy, a 21 gauge needle was advanced into the renal collecting system via a posterior lower pole calyx. Aspiration of urine sample was performed followed by contrast injection. A guidewire was advanced. A transitional dilator was advanced over the guidewire. Percutaneous tract dilatation was then performed over the guidewire. A 10 -French percutaneous nephrostomy tube was then advanced and formed in the collecting system. Catheter position was confirmed by fluoroscopy after contrast injection. The catheter was secured at the skin with a Prolene retention suture and Stat-Lock device. A urine sample was sent for culture analysis. A gravity bag was placed. COMPLICATIONS: None. FINDINGS: Severe hydronephrosis is present with retained contrast material in the left renal collecting system. After access of the lower pole, a nephrostomy tube was eventually placed and advanced into the renal pelvis. IMPRESSION: Placement of 10 French left percutaneous nephrostomy tube. A urine sample was sent for culture analysis. Electronically Signed   By: Aletta Edouard M.D.   On: 11/30/2017 09:20   PREVIOUS ENDOSCOPIES:            See HPI   Impression / Plan:   Impression: 1. Constipation: Patient has  chronic IBS-C controlled with Linzess 290 mcg in the past, acutely patient developed an adynamic ileus upon admission 11/28/17, he had not had a bowel movement for 2 weeks prior, CT showed only mild amount of stool burden, repeat x-ray yesterday, 11/29/17 shows mild stool burden and resolving/resolved ileus, patient was just started on a soft diet this morning, prior to this only liquids, he is also on morphine and is not moving around as much as usual; discussed with the patient and his wife that his constipation at this time is multifactorial, due to all of the above.  The patient is on multiple medications including Senokot twice a day, MiraLAX twice  a day, Amitiza twice a day and a Dulcolax suppository.  He is continuing with a small amount of stool and some gas passage, but "not enough" per him. 2. Lower abdominal cramping: With above plus/minus recent manipulation by urology 3.  Rectal pain: Patient tells me this is due to hemorrhoids, he would not let me do a rectal exam today, patient describes pain with passing a bowel movement, sharp; question likely fissure +/- hemorrhoids 4. Invasive urothelial carcinoma  Plan: 1.  Spent a long time in discussion with the patient and his wife.  Discussed all of the factors that could be contributing to his constipation at this time.  They are most concerned regarding his lower abdominal cramping which kept him awake last night.  At this time, will discontinue Senokot as this could be making this cramping worse.  There is not a large amount of stool in his colon for [place] to get out, and he does not need to be on this many medications all at once. 2.  Reviewed patient's imaging with him.  Answered questions. 3.  Patient should be encouraged to get up and move around as well as maintain a regular diet.  All of this will help him to have more regular bowel movements. 4.  Could consider hyoscyamine as needed for cramping, especially if this wakes him in the night, we  will leave this per Dr. Ardis Hughs. 5.  Patient was also discussing some rectal pain at time of my interview, he deferred rectal exam by me and would like this done by Dr. Ardis Hughs.  Patient is describing a sharp pain with passage of stool, this could represent a fissure.  Either way, patient would likely benefit from sitz baths. 6.  Discussed above with Dr. Ardis Hughs.  He will see the patient later this afternoon.  Please await any further recommendations from him.  Thank you for your kind consultation, we will continue to follow.  Lavone Nian [facility]  11/30/2017, 12:17 PM Pager #: [phone]   ________________________________________________________________________  Velora Heckler GI MD note:  I personally examined the patient, reviewed the data and agree with the assessment and plan described above. My rectal exam was not normal (firmness in anterior rectum that is probably the prostate) and review of rectum on two recent CT scans is not normal (perirectal stranding and thickening).  I recommend we proceed with direct visualization tomorrow. Colonoscopy rather than flex sig since he had several polyps 3 years ago and was due for colonoscopy last month anyway.   Owens Loffler, MD [facility] Pager [phone]

## 2017-11-30 NOTE — Anesthesia Preprocedure Evaluation (Addendum)
Anesthesia Evaluation  Patient identified by MRN, date of birth, ID band Patient awake    Reviewed: Allergy & Precautions, NPO status , Patient's Chart, lab work & pertinent test results  Airway Mallampati: II  TM Distance: >3 FB Neck ROM: Full    Dental no notable dental hx.    Pulmonary neg pulmonary ROS, former smoker,    Pulmonary exam normal breath sounds clear to auscultation       Cardiovascular hypertension, Pt. on medications and Pt. on home beta blockers + CAD, + Past MI and + CABG  Normal cardiovascular exam Rhythm:Regular Rate:Normal     Neuro/Psych negative neurological ROS  negative psych ROS   GI/Hepatic Neg liver ROS, hiatal hernia, GERD  ,Abnormal rectal exam, CT and constipation.   Endo/Other  negative endocrine ROS  Renal/GU CRFRenal disease  negative genitourinary   Musculoskeletal negative musculoskeletal ROS (+)   Abdominal   Peds negative pediatric ROS (+)  Hematology  (+) Blood dyscrasia (Thrombocytopenia), anemia ,   Anesthesia Other Findings   Reproductive/Obstetrics negative OB ROS                             Anesthesia Physical  Anesthesia Plan  ASA: III  Anesthesia Plan: MAC   Post-op Pain Management:    Induction: Intravenous  PONV Risk Score and Plan: 2 and Ondansetron, Treatment may vary due to age or medical condition and Propofol infusion  Airway Management Planned: Natural Airway and Simple Face Mask  Additional Equipment:   Intra-op Plan:   Post-operative Plan:   Informed Consent: I have reviewed the patients History and Physical, chart, labs and discussed the procedure including the risks, benefits and alternatives for the proposed anesthesia with the patient or authorized representative who has indicated his/her understanding and acceptance.     Plan Discussed with: CRNA  Anesthesia Plan Comments:        Anesthesia Quick  Evaluation

## 2017-11-30 NOTE — Telephone Encounter (Signed)
Patient wife calling back stating pt IBS has not got any better. Patient is still in hosp for kidney cancer but pt wife states he needs to [facility] what to do about the constipation.

## 2017-11-30 NOTE — H&P (View-Only) (Signed)
Consultation  Referring Provider:  Dr. Grandville Silos    Primary Care Physician:  [name], MD Primary Gastroenterologist: Dr. Hilarie Fredrickson    Reason for Consultation:  Ileus, Constipation, IBS, lower abdominal pain         HPI:   Charles Coffey is a 82 y.o. male with a past medical history as listed below including invasive urothelial carcinoma involving the left urethra and trigone, status post recent cystoscopy and left retrograde pyelogram with TURBT on 11/25/17, who initially presented to the ED on 11/29/17 for fever, constipation and moderate bilateral hydronephrosis.  We were consulted today in regards to the patient's ongoing constipation complaints as well as lower abdominal pain.    Per review of chart patient was seen in clinic 07/20/17 by Dr. Hilarie Fredrickson and at that time discussed his chronic reflux as well as constipation.  He was using Linzess 290 mcg daily and was having a daily bowel movement.  Patient described that if he skipped Linzess he often felt constipated with abdominal bloating.  He was continued on Linzess.  Patient then called our clinic 11/23/17 and described the Linzess has stopped working.  He was changed Amitiza 24 mcg twice daily.  Patient's wife called our clinic 11/28/17 and described worsening constipation.  He had undergone a urological procedure 11/29 and tried one bottle of magnesium citrate and was continuing his Amitiza.  He was instructed to use magnesium citrate again.  Patient had also done a MiraLAX prep.     Today, the patient and his wife are very concerned regarding the patient in general.  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  In general they feel as though his medical team is moving quite slowly regarding his cancer and plans to take care of this and they are frustrated.    In regards to the patient's GI complaints, e tells me that he has had only very small liquid bowel  movements and is only passing a very small amount of gas.  They explained that even before he was in the hospital he was battling constipation.  They describe his history of being on Linzess 290 mcg daily which seemed to work within an hour and a half of taking it, per the patient, but stopped working/slowed down after about 2-3 months of this medication.  Patient was then switched to Amitiza 24 mcg which he tells me "never really helped".  Patient was then seen as above for his cystoscopy and surgery with a finding of invasive urethral carcinoma.  In the midst of this patient was not having good bowel movements.  When he presented to the hospital CT showed ileus.  Patient tells me he has not had a good bowel movement in about 2 weeks.  Currently, he is taking "whatever medicines they give me", other than the fact the patient tells me he refused his MiraLAX this morning as he felt as though this was making him more bloated.  Patient does tell me the Dulcolax suppository yesterday did "help me a little".  Patient has just started eating a soft diet today, prior to this he was on liquids.  Patient is also on pain meds including morphine.    Patient also describes an accompanying lower abdominal cramping sensation.  Apparently this has been worse ever since Sunday and his admission to the hospital.  He has not experienced this at all today, but  last night it seemed to keep him awake.  Patient describes that this will "come in waves".  Patient also experiences some bloating.    Also describes rectal pain, worse with passing a bowel movement.  He describes this as sharp in nature.  He tells me that this is "just my hemorrhoids".  He denies any rectal bleeding.    Patient denies any continued fever or chills, blood in his stool or melena.     ED course: Temp 101.8, CBC with a hemoglobin of 9.4, platelet count of 72 and otherwise normal, CMP with a bicarb of 20, BUN of 24 and creatinine of 1.56.  Lactic acid level is  0.72.  EKG showed right bundle branch block and left anterior fascicular block.  CT abdomen pelvis showed moderate left hydronephrosis with residual contrast left intrarenal collecting system and portions of the left ureter consistent with moderate obstructive uropathy which appeared due to area of irregular bladder wall thickening or mass crossing the left urethral trigone.  Mild right hydronephrosis.  Left hydronephrosis.  CT also showed small bowel and colon with air-fluid levels, but nondilated with no wall thickening or adjacent inflammation.  This is consistent with a mild diffuse adynamic ileus.  There is only mild colonic stool burden.  Urology was consulted as well as IR.  Hospital course: 11/28/17 patient had a left percutaneous nephrostomy tube placed by IR.  He was started on antibiotics.  Patient is being followed by urology as well as oncology. Abdominal x-ray on 11/29/17 showed nonobstructive bowel gas pattern with no radiographic evidence for acute intra-abdominal pathology.  Overall stool burden was mild.  Percutaneous nephrostomy tube.  Stable cardiomegaly with mild diffuse pulmonary interstitial congestion without frank pulmonary edema.  GI history: 10/17/14-colonoscopy, Dr. Hilarie Fredrickson: Impression: 5 sessile polyps ranging from 2-7 mm in size in the ascending and transverse colon, mild diverticulosis in the sigmoid colon and melanosis coli; repeat was recommended in 3 years.  Past Medical History:  Diagnosis Date  . Aortic atherosclerosis ([place])   . BPH with urinary obstruction   . CAD (coronary artery disease)    a.  MI 1995, CABG x 3 2002 (patient says that he had LIMA and RIMA grafts). b. ETT-Cardiolite (10/15) with EF 48%, apical scar, no ischemia. c. Nuc 07/2017 abnormal -> cath was performed,  patent LIMA to LAD and SVG to OM 2. RIMA to RCA is atretic. The right coronary artery is occluded distally with extensive collaterals from the LAD, medical therapy.   . Cervical spondylosis  without myelopathy 02/06/2016  . Chronic low back pain    sees Dr. Kary Kos   . GERD (gastroesophageal reflux disease)   . Hiatal hernia   . Hyperlipidemia   . Hypertension   . IBS (irritable bowel syndrome)   . Ischemic cardiomyopathy   . Myocardial infarction ([place])   . RBBB   . Restless legs syndrome   . Statin intolerance   . Syncope    a. in 2015 - no apparent cause, was taking sleep medicine at the time. Cardiac workup unremarkable.  . Tubular adenoma of colon     Past Surgical History:  Procedure Laterality Date  . COLONOSCOPY  10/17/2014   per Dr. Hilarie Fredrickson, tubular adenomas, repeat in 3 yrs   . CYSTOSCOPY W/ RETROGRADES Left 11/25/2017   Procedure: CYSTOSCOPY WITH RETROGRADE PYELOGRAM;  Surgeon: Irine Seal, MD;  Location: [facility];  Service: Urology;  Laterality: Left;  . HEART BYPASS    . IR NEPHROSTOMY PLACEMENT  LEFT  11/28/2017  . LEFT HEART CATH AND CORS/GRAFTS ANGIOGRAPHY N/A 08/25/2017   Procedure: LEFT HEART CATH AND CORS/GRAFTS ANGIOGRAPHY;  Surgeon: Wellington Hampshire, MD;  Location: [facility];  Service: Cardiovascular;  Laterality: N/A;  . LUMBAR FUSION  2003   L3-L4  . PROSTATE SURGERY  06-27-12   per Dr. Roni Bread, had CTT  . TONSILLECTOMY    . TRANSURETHRAL RESECTION OF BLADDER TUMOR N/A 11/25/2017   Procedure: TRANSURETHRAL RESECTION OF BLADDER TUMOR (TURBT);  Surgeon: Irine Seal, MD;  Location: [facility];  Service: Urology;  Laterality: N/A;    Family History  Problem Relation Age of Onset  . Heart disease Mother   . Heart attack Mother   . Aneurysm Father        femoral artery  . Heart disease Brother   . Heart disease Maternal Uncle        x 2  . Colon cancer Neg Hx   . Esophageal cancer Neg Hx   . Pancreatic cancer Neg Hx   . Kidney disease Neg Hx   . Liver disease Neg Hx      Social History   Tobacco Use  . Smoking status: Former Smoker    Types: Cigarettes    Last attempt to quit: 12/28/1978    Years since quitting: 38.9  . Smokeless  tobacco: Never Used  Substance Use Topics  . Alcohol use: Yes    Alcohol/week: 0.0 oz    Comment: occ-  . Drug use: No    Prior to Admission medications   Medication Sig Start Date End Date Taking? Authorizing Provider  acetaminophen (TYLENOL) 500 MG tablet Take 1,000 mg by mouth every 8 (eight) hours as needed for mild pain or moderate pain.   Yes [provider]  aspirin 81 MG tablet Take 81 mg by mouth daily.     Yes [provider]  carvedilol (COREG) 6.25 MG tablet Take 1 tablet (6.25 mg total) 2 (two) times daily with a meal by mouth. 11/09/17  Yes Larey Dresser, MD  diphenhydramine-acetaminophen (TYLENOL PM EXTRA STRENGTH) 25-500 MG TABS tablet Take 1 tablet by mouth at bedtime as needed.    Yes [provider]  Hydrocortisone Acetate (HEMORRHOIDAL-HC RE) Place 1 application rectally as needed (hemorrhoids).   Yes [provider]  isosorbide mononitrate (IMDUR) 60 MG 24 hr tablet Take 1 tablet (60 mg total) by mouth daily. 10/28/17 01/26/18 Yes End, Harrell Gave, MD  lubiprostone (AMITIZA) 24 MCG capsule Take 1 capsule (24 mcg total) by mouth 2 (two) times daily with a meal. 11/23/17  Yes Pyrtle, Lajuan Lines, MD  magnesium citrate SOLN Take 1 Bottle by mouth once.   Yes [provider]  nitroGLYCERIN (NITROSTAT) 0.4 MG SL tablet Place 1 tablet (0.4 mg total) under the tongue every 5 (five) minutes as needed. Patient taking differently: Place 0.4 mg under the tongue every 5 (five) minutes as needed for chest pain.  08/19/17 11/28/17 Yes Dunn, Dayna N, PA-C  Plant Sterols and Stanols (CHOLESTOFF PO) Take 2 tablets by mouth 2 (two) times daily.   Yes [provider]  polyethylene glycol (MIRALAX / GLYCOLAX) packet Take 17 g by mouth daily as needed for moderate constipation.   Yes [provider]  pramipexole (MIRAPEX) 1 MG tablet TAKE 1 AND 1/2 TABLETS BY MOUTH AT BEDTIME 06/29/17  Yes [name], MD  ranitidine (ZANTAC) 300 MG  tablet TAKE 1 TABLET BY MOUTH TWICE A DAY 10/04/17  Yes  Pyrtle, Lajuan Lines, MD  Tetrahydroz-Glyc-Hyprom-PEG Karma Lew MAXIMUM REDNESS RELIEF OP) Place 2 drops into both eyes 2 (two) times daily as needed (dry eyes).    Yes [provider]  traMADol (ULTRAM) 50 MG tablet Take 50 mg by mouth every 6 (six) hours as needed for moderate pain.    Yes [provider]  linaclotide (LINZESS) 290 MCG CAPS capsule TAKE 1 CAPSULE (290 MCG TOTAL) BY MOUTH DAILY. [place] A MEAL Patient not taking: Reported on 11/24/2017 03/30/17   Pyrtle, Lajuan Lines, MD    Current Facility-Administered Medications  Medication Dose Route Frequency Provider Last Rate Last Dose  . acetaminophen (TYLENOL) tablet 650 mg  650 mg Oral Q6H PRN Eugenie Filler, MD       Or  . acetaminophen (TYLENOL) suppository 650 mg  650 mg Rectal Q6H PRN Eugenie Filler, MD      . albuterol (PROVENTIL) (2.5 MG/3ML) 0.083% nebulizer solution 2.5 mg  2.5 mg Nebulization Q2H PRN Eugenie Filler, MD      . bisacodyl (DULCOLAX) suppository 10 mg  10 mg Rectal Daily PRN Eugenie Filler, MD      . carvedilol (COREG) tablet 6.25 mg  6.25 mg Oral BID WC Eugenie Filler, MD   6.25 mg at 11/30/17 3614  . cefTRIAXone (ROCEPHIN) 2 g in dextrose 5 % 50 mL IVPB  2 g Intravenous Q24H Eugenie Filler, MD   Stopped at 11/30/17 [id number]  . cyanocobalamin ((VITAMIN B-12)) injection 1,000 mcg  1,000 mcg Intramuscular Daily Eugenie Filler, MD      . famotidine (PEPCID) tablet 20 mg  20 mg Oral BID Eugenie Filler, MD   20 mg at 11/30/17 4008  . hydrocortisone (ANUSOL-HC) suppository 25 mg  25 mg Rectal PRN Eugenie Filler, MD      . isosorbide mononitrate (IMDUR) 24 hr tablet 60 mg  60 mg Oral Daily Eugenie Filler, MD   60 mg at 11/30/17 6761  . lubiprostone (AMITIZA) capsule 24 mcg  24 mcg Oral BID WC Gardiner Barefoot, NP   24 mcg at 11/30/17 0904  . morphine 4 MG/ML injection 2 mg  2 mg Intravenous Q4H PRN Eugenie Filler, MD   2 mg at 11/30/17 1004  . nitroGLYCERIN (NITROSTAT) SL tablet 0.4 mg  0.4 mg Sublingual Q5 min PRN Eugenie Filler, MD      . ondansetron [facility]) tablet 4 mg  4 mg Oral Q6H PRN Eugenie Filler, MD       Or  . ondansetron [facility]) injection 4 mg  4 mg Intravenous Q6H PRN Eugenie Filler, MD      . oxyCODONE (Oxy IR/ROXICODONE) immediate release tablet 5 mg  5 mg Oral Q4H PRN Eugenie Filler, MD      . pantoprazole (PROTONIX) injection 40 mg  40 mg Intravenous Q24H Eugenie Filler, MD   40 mg at 11/29/17 2154  . polyethylene glycol (MIRALAX / GLYCOLAX) packet 17 g  17 g Oral BID Eugenie Filler, MD      . pramipexole (MIRAPEX) tablet 0.25 mg  0.25 mg Oral TID Eugenie Filler, MD   0.25 mg at 11/30/17 9509  . senna (SENOKOT) tablet 8.6 mg  1 tablet Oral BID Eugenie Filler, MD   8.6 mg at 11/30/17 3267  . sodium chloride flush (NS) 0.9 % injection 5 mL  5 mL Intravenous Q8H Aletta Edouard, MD   5 mL  at 11/30/17 1126  . vancomycin (VANCOCIN) IVPB 1000 mg/200 mL premix  1,000 mg Intravenous Q24H Dara Hoyer, RPH 200 mL/hr at 11/30/17 0057 1,000 mg at 11/30/17 0057    Allergies as of 11/28/2017 - Review Complete 11/28/2017  Allergen Reaction Noted  . Antihistamines, loratadine-type Other (See Comments) 11/25/2011  . Statins Other (See Comments) 10/15/2010     Review of Systems:    Constitutional: No current fever or chills Skin: No rash  Cardiovascular: No chest pain Respiratory: No SOB  Gastrointestinal: See HPI and otherwise negative Genitourinary: No dysuria  Neurological: No headache, dizziness or syncope Musculoskeletal: No new muscle or joint pain Hematologic: No bruising Psychiatric: No history of depression or anxiety   Physical Exam:  Vital signs in last 24 hours: Temp:  [97.9 F (36.6 C)-98.3 F (36.8 C)] 98.2 F (36.8 C) (12/04 0553) Pulse Rate:  [56-64] 60 (12/04 0553) Resp:  [14-22] 20 (12/04 0553) BP: (104-118)/(65-83)  104/83 (12/04 0553) SpO2:  [97 %-98 %] 98 % (12/04 0553) Weight:  [194 lb 0.1 oz (88 kg)] 194 lb 0.1 oz (88 kg) (12/04 1200) Last BM Date: (2 weeks ago per pt) General:   Pleasant Caucasian male appears to be in NAD, Well developed, Well nourished, alert and cooperative Head:  Normocephalic and atraumatic. Eyes:   PEERL, EOMI. No icterus. Conjunctiva pink. Ears:  Normal auditory acuity. Neck:  Supple Throat: Oral cavity and pharynx without inflammation, swelling or lesion.  Lungs: Respirations even and unlabored. Lungs clear to auscultation bilaterally.   No wheezes, crackles, or rhonchi.  Heart: Normal S1, S2. No MRG. Regular rate and rhythm. No peripheral edema, cyanosis or pallor.  Abdomen:  Soft, nondistended, mild b/l lower ttp. No rebound or guarding. Normal bowel sounds. No appreciable masses or hepatomegaly. Rectal: Patient declined Msk:  Symmetrical without gross deformities. Peripheral pulses intact.  Extremities:  Without edema, no deformity or joint abnormality.  Neurologic:  Alert and  oriented x4;  grossly normal neurologically.  Skin:   Dry and intact without significant lesions or rashes. Psychiatric:  Demonstrates good judgement and reason without abnormal affect or behaviors.   LAB RESULTS: Recent Labs    11/28/17 1518 11/29/17 0540 11/30/17 0555  WBC 9.9 6.8 6.8  HGB 9.4* 8.1* 9.2*  HCT 27.5* 24.3* 27.1*  PLT 72* 59* 60*   BMET Recent Labs    11/28/17 1518 11/29/17 0540 11/30/17 0555  NA 133* 134* 132*  K 4.3 4.4 4.3  CL 104 105 103  CO2 20* 21* 22  GLUCOSE 100* 98 114*  BUN 24* 26* 24*  CREATININE 1.56* 1.55* 1.49*  CALCIUM 9.1 8.5* 9.0   LFT Recent Labs    11/28/17 1518  PROT 6.7  ALBUMIN 4.0  AST 23  ALT 18  ALKPHOS 51  BILITOT 1.1   STUDIES: Ct Abdomen Pelvis Wo Contrast  Result Date: 11/28/2017 CLINICAL DATA:  Family reports pt had renal and bladder biopsies on Thursday or Friday. Family reports pt has had a fever at home, nausea,  dysuria, and AMS. Denies any antipyretic use today. [name]. EXAM: CT ABDOMEN AND PELVIS WITHOUT CONTRAST TECHNIQUE: Multidetector CT imaging of the abdomen and pelvis was performed following the standard protocol without IV contrast. COMPARISON:  11/11/2017 FINDINGS: Lower chest: Small right and minimal left pleural effusions. Pleural effusions are new since the prior CT. Mild interstitial thickening at the lung bases as well as subsegmental atelectasis, greater on the right. Interstitial thickening is similar  to the prior CT. No convincing pneumonia. Hepatobiliary: No focal liver abnormality is seen. No gallstones, gallbladder wall thickening, or biliary dilatation. Pancreas: Unremarkable. No pancreatic ductal dilatation or surrounding inflammatory changes. Spleen: Normal in size without focal abnormality. Adrenals/Urinary Tract: Residual contrast fills the moderately dilated left intrarenal collecting system and portions of the left ureter. There is mild dilation of the right intrarenal collecting system and portions of the right ureter. There is diffuse left renal cortical thinning. No renal masses. No visualized stones. No adrenal masses. Irregular thickening along the posterior aspect of the bladder, greater on the left, is similar to the prior CT. Stomach/Bowel: Stomach is unremarkable. The small bowel and colon show air-fluid levels, but are nondilated with no wall thickening or adjacent inflammation. This is consistent with a mild diffuse adynamic ileus. No evidence of obstruction. Mild colonic stool burden. Appendix not visualized. Vascular/Lymphatic: Aortic atherosclerosis. No enlarged abdominal or pelvic lymph nodes. Reproductive: Prostate gland is enlarged this, bulging against the posterior inferior bladder base, stable from the recent CT. Other: No abdominal wall hernia or abnormality. No abdominopelvic ascites. Musculoskeletal: No fracture or acute finding. No  osteoblastic or osteolytic lesions. Stable changes from a previous L3-L4 posterior lumbar spine fusion. Chronic bilateral pars defects with a grade 1 anterolisthesis at L5-S1. IMPRESSION: 1. Moderate left hydronephrosis. There is residual contrast filling of the left intrarenal collecting system and portions of the left ureter. This is consistent with moderate obstructive uropathy, which appears due to an area of irregular bladder wall thickening or mass crossing the left ureteral trigone. 2. Mild right hydronephrosis. Right hydronephrosis has increased from the previous CT scan. Left hydronephrosis is unchanged. There is left perinephric and peri ureteral stranding, which is similar to the prior exam which may be the result of the obstruction only, but infection should also be considered. 3. Small right and minimal left pleural effusions. Mild lung base interstitial thickening. Consider mild congestive heart failure given the symptoms of shortness of breath. Electronically Signed   By: Lajean Manes M.D.   On: 11/28/2017 16:18   Dg Chest Port 1 View  Result Date: 11/28/2017 CLINICAL DATA:  Per order- SOBHis chronic constipation has worsened over the past several days. He has also felt very poorly, unable to get out of bed today and low-grade fevers. Pt states Sob today. EXAM: PORTABLE CHEST 1 VIEW COMPARISON:  None. FINDINGS: There changes from prior cardiac surgery. The cardiac silhouette is normal in size and configuration. No mediastinal or hilar masses. No convincing adenopathy. Clear lungs.  No pleural effusion or pneumothorax. Skeletal structures are intact. IMPRESSION: No active disease. Electronically Signed   By: Lajean Manes M.D.   On: 11/28/2017 15:59   Acute Abdominal Series  Result Date: 11/29/2017 CLINICAL DATA:  Initial evaluation for constipation for 1 week. EXAM: DG ABDOMEN ACUTE W/ 1V CHEST COMPARISON:  Prior CT from 11/28/2017. FINDINGS: Median sternotomy wires underlying cardiomegaly.  Mediastinal silhouette normal. Lungs hypoinflated. Mild diffuse pulmonary interstitial congestion without frank alveolar edema. No other focal infiltrates. No pleural effusion. No pneumothorax. Few scattered gas-filled loops of bowel seen throughout the abdomen without evidence for obstruction or ileus. No free air seen on lateral decubitus view. No significant bowel wall thickening. Overall stool burden is relatively mild. Soft tissue mass or abnormal calcification. Left-sided nephrostomy tube noted. Dextroscoliosis with postsurgical changes noted within the lower lumbar spine. IMPRESSION: 1. Nonobstructive bowel gas pattern with no radiographic evidence for acute intra-abdominal pathology. Overall stool burden is mild. 2. Percutaneous  nephrostomy tube in place. 3. Stable cardiomegaly with mild diffuse pulmonary interstitial congestion without frank pulmonary edema. Electronically Signed   By: Jeannine Boga M.D.   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 Status post retrograde urography on 11/25/2017. At that time, contrast injection was performed but a ureteral stent could not be advanced to the level of the kidney. He now presents with increasing flank and back pain as well as fever with evidence of developing urosepsis. By CT, there is retained contrast material in the left renal collecting system that demonstrates significant hydronephrosis. EXAM: LEFT PERCUTANEOUS NEPHROSTOMY TUBE PLACEMENT. COMPARISON:  CT of the abdomen on 11/28/2017 and imaging from retrograde urography study on 11/25/2017 ANESTHESIA/SEDATION: 1.0 mg IV Versed; 50 mcg IV Fentanyl. Total Moderate Sedation Time 6 minutes. The patient's level of consciousness and physiologic status were continuously monitored during the procedure by Radiology  nursing. CONTRAST:  15 mL Isovue-300 injected into the renal collecting system. MEDICATIONS: IV vancomycin was being infused at the time of the procedure. FLUOROSCOPY TIME:  1 minute and 42 seconds.  72 mGy. PROCEDURE: The procedure, risks, benefits, and alternatives were explained to the patient. Questions regarding the procedure were encouraged and answered. The patient understands and consents to the procedure. A time-out was performed prior to initiating the procedure. The left flank region was prepped with chlorhexidine in a sterile fashion, and a sterile drape was applied covering the operative field. A sterile gown and sterile gloves were used for the procedure. Local anesthesia was provided with 1% Lidocaine. Under fluoroscopy, a 21 gauge needle was advanced into the renal collecting system via a posterior lower pole calyx. Aspiration of urine sample was performed followed by contrast injection. A guidewire was advanced. A transitional dilator was advanced over the guidewire. Percutaneous tract dilatation was then performed over the guidewire. A 10 -French percutaneous nephrostomy tube was then advanced and formed in the collecting system. Catheter position was confirmed by fluoroscopy after contrast injection. The catheter was secured at the skin with a Prolene retention suture and Stat-Lock device. A urine sample was sent for culture analysis. A gravity bag was placed. COMPLICATIONS: None. FINDINGS: Severe hydronephrosis is present with retained contrast material in the left renal collecting system. After access of the lower pole, a nephrostomy tube was eventually placed and advanced into the renal pelvis. IMPRESSION: Placement of 10 French left percutaneous nephrostomy tube. A urine sample was sent for culture analysis. Electronically Signed   By: Aletta Edouard M.D.   On: 11/30/2017 09:20   PREVIOUS ENDOSCOPIES:            See HPI   Impression / Plan:   Impression: 1. Constipation: Patient has  chronic IBS-C controlled with Linzess 290 mcg in the past, acutely patient developed an adynamic ileus upon admission 11/28/17, he had not had a bowel movement for 2 weeks prior, CT showed only mild amount of stool burden, repeat x-ray yesterday, 11/29/17 shows mild stool burden and resolving/resolved ileus, patient was just started on a soft diet this morning, prior to this only liquids, he is also on morphine and is not moving around as much as usual; discussed with the patient and his wife that his constipation at this time is multifactorial, due to all of the above.  The patient is on multiple medications including Senokot twice a day, MiraLAX twice  a day, Amitiza twice a day and a Dulcolax suppository.  He is continuing with a small amount of stool and some gas passage, but "not enough" per him. 2. Lower abdominal cramping: With above plus/minus recent manipulation by urology 3.  Rectal pain: Patient tells me this is due to hemorrhoids, he would not let me do a rectal exam today, patient describes pain with passing a bowel movement, sharp; question likely fissure +/- hemorrhoids 4. Invasive urothelial carcinoma  Plan: 1.  Spent a long time in discussion with the patient and his wife.  Discussed all of the factors that could be contributing to his constipation at this time.  They are most concerned regarding his lower abdominal cramping which kept him awake last night.  At this time, will discontinue Senokot as this could be making this cramping worse.  There is not a large amount of stool in his colon for [place] to get out, and he does not need to be on this many medications all at once. 2.  Reviewed patient's imaging with him.  Answered questions. 3.  Patient should be encouraged to get up and move around as well as maintain a regular diet.  All of this will help him to have more regular bowel movements. 4.  Could consider hyoscyamine as needed for cramping, especially if this wakes him in the night, we  will leave this per Dr. Ardis Hughs. 5.  Patient was also discussing some rectal pain at time of my interview, he deferred rectal exam by me and would like this done by Dr. Ardis Hughs.  Patient is describing a sharp pain with passage of stool, this could represent a fissure.  Either way, patient would likely benefit from sitz baths. 6.  Discussed above with Dr. Ardis Hughs.  He will see the patient later this afternoon.  Please await any further recommendations from him.  Thank you for your kind consultation, we will continue to follow.  Lavone Nian [facility]  11/30/2017, 12:17 PM Pager #: [phone]   ________________________________________________________________________  Velora Heckler GI MD note:  I personally examined the patient, reviewed the data and agree with the assessment and plan described above. My rectal exam was not normal (firmness in anterior rectum that is probably the prostate) and review of rectum on two recent CT scans is not normal (perirectal stranding and thickening).  I recommend we proceed with direct visualization tomorrow. Colonoscopy rather than flex sig since he had several polyps 3 years ago and was due for colonoscopy last month anyway.   Owens Loffler, MD [facility] Pager [phone]

## 2017-11-30 NOTE — Progress Notes (Signed)
Referring Physician(s): Keene Breath  Supervising Physician: Daryll Brod  Patient Status:  [name]  Chief Complaint:  Left flank pain, fever, left hydronephrosis  Subjective: Patient without acute changes. No recent BM; for colonoscopy tomorrow.   Allergies: Antihistamines, loratadine-type and Statins  Medications: Prior to Admission medications   Medication Sig Start Date End Date Taking? Authorizing Provider  acetaminophen (TYLENOL) 500 MG tablet Take 1,000 mg by mouth every 8 (eight) hours as needed for mild pain or moderate pain.   Yes [provider]  aspirin 81 MG tablet Take 81 mg by mouth daily.     Yes [provider]  carvedilol (COREG) 6.25 MG tablet Take 1 tablet (6.25 mg total) 2 (two) times daily with a meal by mouth. 11/09/17  Yes Larey Dresser, MD  diphenhydramine-acetaminophen (TYLENOL PM EXTRA STRENGTH) 25-500 MG TABS tablet Take 1 tablet by mouth at bedtime as needed.    Yes [provider]  Hydrocortisone Acetate (HEMORRHOIDAL-HC RE) Place 1 application rectally as needed (hemorrhoids).   Yes [provider]  isosorbide mononitrate (IMDUR) 60 MG 24 hr tablet Take 1 tablet (60 mg total) by mouth daily. 10/28/17 01/26/18 Yes End, Harrell Gave, MD  lubiprostone (AMITIZA) 24 MCG capsule Take 1 capsule (24 mcg total) by mouth 2 (two) times daily with a meal. 11/23/17  Yes Pyrtle, Lajuan Lines, MD  magnesium citrate SOLN Take 1 Bottle by mouth once.   Yes [provider]  nitroGLYCERIN (NITROSTAT) 0.4 MG SL tablet Place 1 tablet (0.4 mg total) under the tongue every 5 (five) minutes as needed. Patient taking differently: Place 0.4 mg under the tongue every 5 (five) minutes as needed for chest pain.  08/19/17 11/28/17 Yes Dunn, Dayna N, PA-C  Plant Sterols and Stanols (CHOLESTOFF PO) Take 2 tablets by mouth 2 (two) times daily.   Yes [provider]  polyethylene glycol (MIRALAX / GLYCOLAX) packet Take 17 g by mouth  daily as needed for moderate constipation.   Yes [provider]  pramipexole (MIRAPEX) 1 MG tablet TAKE 1 AND 1/2 TABLETS BY MOUTH AT BEDTIME 06/29/17  Yes [name], MD  ranitidine (ZANTAC) 300 MG tablet TAKE 1 TABLET BY MOUTH TWICE A DAY 10/04/17  Yes Pyrtle, Lajuan Lines, MD  Tetrahydroz-Glyc-Hyprom-PEG (VISINE MAXIMUM REDNESS RELIEF OP) Place 2 drops into both eyes 2 (two) times daily as needed (dry eyes).    Yes [provider]  traMADol (ULTRAM) 50 MG tablet Take 50 mg by mouth every 6 (six) hours as needed for moderate pain.    Yes [provider]  linaclotide (LINZESS) 290 MCG CAPS capsule TAKE 1 CAPSULE (290 MCG TOTAL) BY MOUTH DAILY. Mar-Mac Patient not taking: Reported on 11/24/2017 03/30/17   Pyrtle, Lajuan Lines, MD     Vital Signs: BP 104/83 (BP Location: Left Arm)   Pulse 60   Temp 98.2 F (36.8 C) (Oral)   Resp 20   Wt 194 lb 0.1 oz (88 kg)   SpO2 98%   BMI 29.07 kg/m   Physical Exam left nephrostomy tube intact, output 100+ cc of bloody urine; insertion site okay; drain flushes without difficulty; clean catch urine cx- insig growth; PCN urine cx neg Imaging: Ct Abdomen Pelvis Wo Contrast  Result Date: 11/28/2017 CLINICAL DATA:  Family reports pt had renal and bladder biopsies on Thursday or Friday. Family reports pt has had a fever at home, nausea, dysuria, and AMS. Denies any antipyretic use today. Patient  states he is here for Constipation. EXAM: CT ABDOMEN AND PELVIS WITHOUT CONTRAST TECHNIQUE: Multidetector CT imaging of the abdomen and pelvis was performed following the standard protocol without IV contrast. COMPARISON:  11/11/2017 FINDINGS: Lower chest: Small right and minimal left pleural effusions. Pleural effusions are new since the prior CT. Mild interstitial thickening at the lung bases as well as subsegmental atelectasis, greater on the right. Interstitial thickening is similar to the prior CT. No convincing pneumonia.  Hepatobiliary: No focal liver abnormality is seen. No gallstones, gallbladder wall thickening, or biliary dilatation. Pancreas: Unremarkable. No pancreatic ductal dilatation or surrounding inflammatory changes. Spleen: Normal in size without focal abnormality. Adrenals/Urinary Tract: Residual contrast fills the moderately dilated left intrarenal collecting system and portions of the left ureter. There is mild dilation of the right intrarenal collecting system and portions of the right ureter. There is diffuse left renal cortical thinning. No renal masses. No visualized stones. No adrenal masses. Irregular thickening along the posterior aspect of the bladder, greater on the left, is similar to the prior CT. Stomach/Bowel: Stomach is unremarkable. The small bowel and colon show air-fluid levels, but are nondilated with no wall thickening or adjacent inflammation. This is consistent with a mild diffuse adynamic ileus. No evidence of obstruction. Mild colonic stool burden. Appendix not visualized. Vascular/Lymphatic: Aortic atherosclerosis. No enlarged abdominal or pelvic lymph nodes. Reproductive: Prostate gland is enlarged this, bulging against the posterior inferior bladder base, stable from the recent CT. Other: No abdominal wall hernia or abnormality. No abdominopelvic ascites. Musculoskeletal: No fracture or acute finding. No osteoblastic or osteolytic lesions. Stable changes from a previous L3-L4 posterior lumbar spine fusion. Chronic bilateral pars defects with a grade 1 anterolisthesis at L5-S1. IMPRESSION: 1. Moderate left hydronephrosis. There is residual contrast filling of the left intrarenal collecting system and portions of the left ureter. This is consistent with moderate obstructive uropathy, which appears due to an area of irregular bladder wall thickening or mass crossing the left ureteral trigone. 2. Mild right hydronephrosis. Right hydronephrosis has increased from the previous CT scan. Left  hydronephrosis is unchanged. There is left perinephric and peri ureteral stranding, which is similar to the prior exam which may be the result of the obstruction only, but infection should also be considered. 3. Small right and minimal left pleural effusions. Mild lung base interstitial thickening. Consider mild congestive heart failure given the symptoms of shortness of breath. Electronically Signed   By: Lajean Manes M.D.   On: 11/28/2017 16:18   Dg Chest Port 1 View  Result Date: 11/28/2017 CLINICAL DATA:  Per order- SOBHis chronic constipation has worsened over the past several days. He has also felt very poorly, unable to get out of bed today and low-grade fevers. Pt states Sob today. EXAM: PORTABLE CHEST 1 VIEW COMPARISON:  None. FINDINGS: There changes from prior cardiac surgery. The cardiac silhouette is normal in size and configuration. No mediastinal or hilar masses. No convincing adenopathy. Clear lungs.  No pleural effusion or pneumothorax. Skeletal structures are intact. IMPRESSION: No active disease. Electronically Signed   By: Lajean Manes M.D.   On: 11/28/2017 15:59   Dg Abd 2 Views  Result Date: 11/30/2017 CLINICAL DATA:  Abdominal bloating, rectal pain with bowel movements. EXAM: ABDOMEN - 2 VIEW COMPARISON:  Abdominal radiographs of November 29, 2017 FINDINGS: There remain multiple air in fluid levels within loops of colon in the mid and upper abdomen bilaterally. There are few small air-fluid levels in the mid abdomen. No significant rectal  gas is observed. No free extraluminal gas collections are demonstrated. A left nephrostomy tube appears stable. There are postsurgical changes in the lower lumbar spine. There is stable moderate dextrocurvature centered at L2-3. IMPRESSION: There has been distal migration of small bowel gas into the colon. No evidence of obstruction is evident. There is no significant rectal gas however. There is no evidence of perforation. Electronically Signed   By:  David  Martinique M.D.   On: 11/30/2017 14:49   Acute Abdominal Series  Result Date: 11/29/2017 CLINICAL DATA:  Initial evaluation for constipation for 1 week. EXAM: DG ABDOMEN ACUTE W/ 1V CHEST COMPARISON:  Prior CT from 11/28/2017. FINDINGS: Median sternotomy wires underlying cardiomegaly. Mediastinal silhouette normal. Lungs hypoinflated. Mild diffuse pulmonary interstitial congestion without frank alveolar edema. No other focal infiltrates. No pleural effusion. No pneumothorax. Few scattered gas-filled loops of bowel seen throughout the abdomen without evidence for obstruction or ileus. No free air seen on lateral decubitus view. No significant bowel wall thickening. Overall stool burden is relatively mild. Soft tissue mass or abnormal calcification. Left-sided nephrostomy tube noted. Dextroscoliosis with postsurgical changes noted within the lower lumbar spine. IMPRESSION: 1. Nonobstructive bowel gas pattern with no radiographic evidence for acute intra-abdominal pathology. Overall stool burden is mild. 2. Percutaneous nephrostomy tube in place. 3. Stable cardiomegaly with mild diffuse pulmonary interstitial congestion without frank pulmonary edema. Electronically Signed   By: Jeannine Boga M.D.   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 Status post retrograde urography on 11/25/2017. At that time, contrast injection was performed but a ureteral stent could not be advanced to the level of the kidney. He now presents with increasing flank and back pain as well as fever with evidence of developing urosepsis. By CT, there is retained contrast material in the left renal collecting system that demonstrates significant hydronephrosis. EXAM: LEFT PERCUTANEOUS NEPHROSTOMY TUBE PLACEMENT. COMPARISON:  CT of the abdomen on  11/28/2017 and imaging from retrograde urography study on 11/25/2017 ANESTHESIA/SEDATION: 1.0 mg IV Versed; 50 mcg IV Fentanyl. Total Moderate Sedation Time 6 minutes. The patient's level of consciousness and physiologic status were continuously monitored during the procedure by Radiology nursing. CONTRAST:  15 mL Isovue-300 injected into the renal collecting system. MEDICATIONS: IV vancomycin was being infused at the time of the procedure. FLUOROSCOPY TIME:  1 minute and 42 seconds.  72 mGy. PROCEDURE: The procedure, risks, benefits, and alternatives were explained to the patient. Questions regarding the procedure were encouraged and answered. The patient understands and consents to the procedure. A time-out was performed prior to initiating the procedure. The left flank region was prepped with chlorhexidine in a sterile fashion, and a sterile drape was applied covering the operative field. A sterile gown and sterile gloves were used for the procedure. Local anesthesia was provided with 1% Lidocaine. Under fluoroscopy, a 21 gauge needle was advanced into the renal collecting system via a posterior lower pole calyx. Aspiration of urine sample was performed followed by contrast injection. A guidewire was advanced. A transitional dilator was advanced over the guidewire. Percutaneous tract dilatation was then performed over the guidewire. A 10 -French percutaneous nephrostomy tube was then advanced and formed in the collecting system. Catheter position was confirmed by fluoroscopy after contrast injection. The catheter was secured at the skin with a Prolene retention suture and Stat-Lock device. A urine sample was sent  for culture analysis. A gravity bag was placed. COMPLICATIONS: None. FINDINGS: Severe hydronephrosis is present with retained contrast material in the left renal collecting system. After access of the lower pole, a nephrostomy tube was eventually placed and advanced into the renal pelvis. IMPRESSION:  Placement of 10 French left percutaneous nephrostomy tube. A urine sample was sent for culture analysis. Electronically Signed   By: Aletta Edouard M.D.   On: 11/30/2017 09:20    Labs:  CBC: Recent Labs    11/24/17 0813 11/28/17 1518 11/29/17 0540 11/30/17 0555  WBC 5.3 9.9 6.8 6.8  HGB 10.1* 9.4* 8.1* 9.2*  HCT 29.8* 27.5* 24.3* 27.1*  PLT 82* 72* 59* 60*    COAGS: Recent Labs    08/24/17 0948  INR 1.0    BMP: Recent Labs    11/26/17 0526 11/28/17 1518 11/29/17 0540 11/30/17 0555  NA 137 133* 134* 132*  K 5.3* 4.3 4.4 4.3  CL 108 104 105 103  CO2 22 20* 21* 22  GLUCOSE 157* 100* 98 114*  BUN 19 24* 26* 24*  CALCIUM 9.3 9.1 8.5* 9.0  CREATININE 1.58* 1.56* 1.55* 1.49*  GFRNONAA 40* 40* 41* 43*  GFRAA 46* 47* 47* 49*    LIVER FUNCTION TESTS: Recent Labs    12/01/16 0827 09/15/17 1420 11/28/17 1518  BILITOT 0.6  --  1.1  AST 15  --  23  ALT 15  --  18  ALKPHOS 40  --  51  PROT 6.4 6.2 6.7  ALBUMIN 4.1  --  4.0    Assessment and Plan: Pt with hx urothelial carcinoma with bilateral left greater than right hydronephrosis/malignant obstruction, recent fever/flank pain; s/p left percutaneous nephrostomy on 12/2; currently afebrile, blood/urine cx with no sig growth; afebrile, WBC normal, hemoglobin 9.2, platelets 60k, potassium 4.3, creatinine 1.49 (1.55); for colonoscopy tomorrow; other plans as per urology/oncology     Electronically Signed: D. Rowe Robert, PA-C 11/30/2017, 4:56 PM   I spent a total of 15 minutes at the the patient's bedside AND on the patient's hospital floor or unit, greater than 50% of which was counseling/coordinating care for left nephrostomy    Patient ID: Charles Coffey, male   DOB: 1935/08/08, 82 y.o.   MRN: [id number]

## 2017-11-30 NOTE — Telephone Encounter (Signed)
Spoke with pts floor nurse and she states the hospitalist have addressed his constipation. Spoke with wife and she states that they have not and what they are doing is not helping. Discussed with wife that if she asks for a GI consult then one of our providers would see her husband. She verbalized understanding.

## 2017-11-30 NOTE — Progress Notes (Signed)
PROGRESS NOTE    Charles Coffey  TIW:[id number] DOB: 07/27/1935 DOA: 11/28/2017 PCP: [name], MD   Brief Narrative:  Patient is a 82 year old gentleman history of invasive urothelial carcinoma involving left urethra and trigone status post recent cystoscopy and left retrograde pyelogram with TURBT on 11/25/2017, history of hypertension hyperlipidemia presenting to the ED with fever as high as 101, constipation noted to have fever, ileus, moderate bilateral hydronephrosis.  Patient underwent left percutaneous nephrostomy on admission.  Urology following.  Oncology consulted.   Assessment & Plan:   Principal Problem:   Fever Active Problems:   Pyelonephritis   Hyperlipidemia LDL goal <70   RESTLESS LEGS SYNDROME   Essential hypertension   Coronary artery disease of native heart with stable angina pectoris (HCC)   GERD   Cardiomyopathy, ischemic   Chronic low back pain   PAD (peripheral artery disease) (HCC)   Anemia   Stage 3 chronic kidney disease (HCC)   Hydronephrosis due to obstructive malignant neoplasm of bladder (HCC)   Ileus (HCC)   Elevated troponin   Urothelial carcinoma of bladder (HCC)   Dehydration   Thrombocytopenia (HCC)   Low vitamin B12 level  #1 fever Questionable etiology.  Likely secondary to acute pyelonephritis as patient with recent instrumentation in the bladder with cystoscopy, left retrograde pyelogram and TURBT on 11/25/2017.  Patient also noted to have left flank pain on examination.    Urine cultures pending.  Blood cultures pending. Chest x-ray negative for any acute infiltrate.  CT abdomen and pelvis with perinephric stranding on the left and periurethral stranding as well as moderate bilateral hydronephrosis. Continue empiric IV Rocephin.  Discontinue IV vancomycin.  If cultures remain negative tomorrow, patient remained afebrile could likely transition to oral antibiotics to complete a 10-14-day course of antibiotic treatment. Follow.  2.   Probable left-sided pyelonephritis The patient presented with fevers, malaise.  Patient with recent instrumentation the bladder with cystoscopy, left retrograde pyelogram and TURBT on 11/25/2017.  Patient also noted on examination to have left flank pain.    Urine cultures pending.  Blood cultures pending.  Clinical improvement. Continue empiric IV Rocephin.  Follow.  3.  Bilateral hydronephrosis/malignant obstruction of left kidney from invasive urothelial carcinoma Patient has been seen in consultation by urology.  Urology has consulted interventional radiology and patient status post left percutaneous nephrostomy tube placement 11/28/2017 per Dr. Kathlene Cote. Urinalysis has been obtained a urine cultures from UA and post nephrostomy are pending.  Continue empiric IV Rocephin. Per IR and urology.  4.  Elevated troponin Likely demand ischemia.  Patient had one episode of chest pain a day prior to admission which has since resolved.  EKG with a right bundle branch block with left anterior fascicular block with no significant change from prior EKG.  Patient with recent 2D echo done in September 2018 as such we will not repeat at this time.    Cardiac enzymes trending down.  No further workup at this time.  Continue home regimen of cardiac medications. Curb-sided cardiology who are in agreement.   5.  Ileus Likely secondary to problem #3.   Clinical improvement after nephrostomy tube placement. Magnesium level at 2.7.  Potassium at 4.3.  Acute abdominal series from 11/29/2017 with resolution of ileus.  Patient tolerating soft diet.  She received a Dulcolax suppository yesterday and also placed on Senokot-S and however states no bowel movement.  Patient complained of lower abdominal pain that he feels may be his IBS.  MiraLAX twice  daily was ordered however patient refused.  Repeat abdominal films today.  Follow.    #6 history of IBS Patient complaint of lower abdominal cramping and discomfort.  Patient  states has not had a bowel movement.  On presentation patient noted to have an ileus was placed on bowel rest and it does seem to have resolved.  Patient with positive bowel sounds and tolerating oral intake.  Will repeat KUB.  Continue home regimen of Amitiza.  Family insistent on GI being consulted and as such will have a GI consultation for further evaluation and management..  7.  Chronic kidney disease stage III Stable.  Follow.  8.  Dehydration IV fluids.  9.  Hypertension Blood pressure stable.  Continue current regimen of imdur and Coreg.   10.  Coronary artery disease/history of cardiomyopathy Patient noted to have elevated troponins.  Enzymes were elevated however trending down.  Likely secondary to acute illness. Patient currently asymptomatic.  Patient with a recent 2D echo and as such we will not repeat at this time.  Continue current home oral cardiac medications.  Outpatient follow-up.   #11 anemia No overt bleeding. Likely secondary to anemia of chronic disease. Transfusion threshold hemoglobin less than 7.  Vitamin B12 levels were low at 474 and as such we will replete.  H&H has remained stable.  Follow.  12.  Thrombocytopenia Questionable etiology.  Likely secondary to acute infection.  LDH at 210.  Haptoglobin at 233. Patient has been seen by hematology/oncology who recommend monitoring at this time.  Follow.   13.  Urothelial carcinoma of the bladder Patient being followed by urology.  Oncology has been consulted and are following along with urology.  Per urology.  #14 low vitamin B12 levels Vitamin B12 1000 MCG's IM daily times 1 week, and then weekly times 1 month, and then monthly.       DVT prophylaxis: SCDs Code Status: Full Family Communication: Updated patient and wife at bedside. Disposition Plan: Clear home once medically stable and clinically improved.   Consultants:  Interventional radiology: Dr. Kathlene Cote 11/28/2017  Urology: Dr.Wrenn  11/28/2017  Oncology: Dr. [name] 11/29/2017  Gastroenterology pending  Procedures:   CT abdomen and pelvis 11/28/2017  Abdominal series 11/29/2017  Chest x-ray 11/28/2017  Percutaneous nephrostomy per Dr. Kathlene Cote interventional radiology 11/28/2017  Antimicrobials:   IV vancomycin 11/28/2017>>>>> 11/30/2017  IV Rocephin 11/28/2017   Subjective: Laying in bed.  Patient states having lower abdominal cramping pain similar to his IBS flares.  Per wife patient with abdominal pain all night and this morning.  Patient denies any bowel movement.  Patient tolerating oral intake.  Per wife patient anxious about when he is going to be able to go home.  Patient states has not had a bowel movement and had refused MiraLAX this morning.  Family requesting GI consultation.  Objective: Vitals:   11/29/17 0755 11/29/17 1425 11/29/17 2043 11/30/17 0553  BP: (!) 141/69 118/66 112/65 104/83  Pulse: 62 (!) 56 64 60  Resp: 14 14 (!) 22 20  Temp: 98.7 F (37.1 C) 98.3 F (36.8 C) 97.9 F (36.6 C) 98.2 F (36.8 C)  TempSrc: Oral Oral Oral Oral  SpO2: 95% 97% 97% 98%    Intake/Output Summary (Last 24 hours) at 11/30/2017 1202 Last data filed at 11/30/2017 2671 Gross per 24 hour  Intake 250 ml  Output 100 ml  Net 150 ml   There were no vitals filed for this visit.  Examination:  General exam: Appears calm and comfortable  Respiratory system: Clear to auscultation.  No wheezes, no crackles, no rhonchi.  Respiratory effort normal. Cardiovascular system: S1 & S2 heard, RRR. No JVD, murmurs, rubs, gallops or clicks. No pedal edema. Gastrointestinal system: Abdomen is soft, some tenderness in the lower abdominal region.  Positive bowel sounds.  No hepatosplenomegaly. Nephrostomy tube noted on the left side with bloody drainage.  Left flank pain improved. Central nervous system: Alert and oriented. No focal neurological deficits. Extremities: Symmetric 5 x 5 power. Skin: No rashes, lesions or  ulcers Psychiatry: Judgement and insight appear normal. Mood & affect appropriate.     Data Reviewed: I have personally reviewed following labs and imaging studies  CBC: Recent Labs  Lab 11/24/17 0813 11/28/17 1518 11/29/17 0540 11/30/17 0555  WBC 5.3 9.9 6.8 6.8  NEUTROABS  --  6.9  --   --   HGB 10.1* 9.4* 8.1* 9.2*  HCT 29.8* 27.5* 24.3* 27.1*  MCV 90.9 91.4 90.7 90.3  PLT 82* 72* 59* 60*   Basic Metabolic Panel: Recent Labs  Lab 11/24/17 0813 11/26/17 0526 11/28/17 1518 11/29/17 0540 11/29/17 0820 11/30/17 0555  NA 139 137 133* 134*  --  132*  K 4.3 5.3* 4.3 4.4  --  4.3  CL 111 108 104 105  --  103  CO2 22 22 20* 21*  --  22  GLUCOSE 103* 157* 100* 98  --  114*  BUN 19 19 24* 26*  --  24*  CREATININE 1.57* 1.58* 1.56* 1.55*  --  1.49*  CALCIUM 9.4 9.3 9.1 8.5*  --  9.0  MG  --   --   --  2.3 2.7*  --    GFR: Estimated Creatinine Clearance: 43.1 mL/min (A) (by C-G formula based on SCr of 1.49 mg/dL (H)). Liver Function Tests: Recent Labs  Lab 11/28/17 1518  AST 23  ALT 18  ALKPHOS 51  BILITOT 1.1  PROT 6.7  ALBUMIN 4.0   No results for input(s): LIPASE, AMYLASE in the last 168 hours. No results for input(s): AMMONIA in the last 168 hours. Coagulation Profile: No results for input(s): INR, PROTIME in the last 168 hours. Cardiac Enzymes: Recent Labs  Lab 11/28/17 1922 11/29/17 0112 11/29/17 0710  TROPONINI 0.20* 0.17* 0.13*   BNP (last 3 results) No results for input(s): PROBNP in the last 8760 hours. HbA1C: No results for input(s): HGBA1C in the last 72 hours. CBG: Recent Labs  Lab 11/30/17 0751  GLUCAP 107*   Lipid Profile: No results for input(s): CHOL, HDL, LDLCALC, TRIG, CHOLHDL, LDLDIRECT in the last 72 hours. Thyroid Function Tests: No results for input(s): TSH, T4TOTAL, FREET4, T3FREE, THYROIDAB in the last 72 hours. Anemia Panel: Recent Labs    11/29/17 0540 11/29/17 0820  VITAMINB12  --  474  FOLATE  --  15.8  FERRITIN   --  185  TIBC  --  274  IRON  --  27*  RETICCTPCT 2.0  --    Sepsis Labs: Recent Labs  Lab 11/28/17 1538  LATICACIDVEN 0.72    Recent Results (from the past 240 hour(s))  Culture, blood (Routine X 2) w Reflex to ID Panel     Status: None (Preliminary result)   Collection Time: 11/28/17  3:18 PM  Result Value Ref Range Status   Specimen Description BLOOD BLOOD RIGHT HAND  Final   Special Requests   Final    BOTTLES DRAWN AEROBIC AND ANAEROBIC Blood Culture adequate volume   Culture   Final  NO GROWTH 2 DAYS Performed at [facility], [place] [address] [place], [place] [address]    Report Status PENDING  Incomplete  Culture, blood (Routine X 2) w Reflex to ID Panel     Status: None (Preliminary result)   Collection Time: 11/28/17  3:23 PM  Result Value Ref Range Status   Specimen Description BLOOD BLOOD LEFT FOREARM  Final   Special Requests   Final    BOTTLES DRAWN AEROBIC AND ANAEROBIC Blood Culture adequate volume   Culture   Final    NO GROWTH 2 DAYS Performed at [facility], [place] [address] [place], [place] [address]    Report Status PENDING  Incomplete  Culture, Urine     Status: Abnormal   Collection Time: 11/28/17  6:18 PM  Result Value Ref Range Status   Specimen Description URINE, CLEAN CATCH  Final   Special Requests NONE  Final   Culture (A)  Final    <10,000 COLONIES/mL INSIGNIFICANT GROWTH Performed at [facility] [address], [place], [place] [address]    Report Status 11/30/2017 FINAL  Final  Urine culture     Status: None   Collection Time: 11/28/17  8:18 PM  Result Value Ref Range Status   Specimen Description KIDNEY  Final   Special Requests   Final    ASPIRATE OF URINE FROM LEFT RENAL COLLECTING SYSTEM   Culture   Final    NO GROWTH Performed at [facility], [place] [address], [place], [place] [address]    Report Status 11/30/2017 FINAL  Final         Radiology Studies: Ct Abdomen Pelvis Wo Contrast  Result  Date: 11/28/2017 CLINICAL DATA:  Family reports pt had renal and bladder biopsies on Thursday or Friday. Family reports pt has had a fever at home, nausea, dysuria, and AMS. Denies any antipyretic use today. [name]. EXAM: CT ABDOMEN AND PELVIS WITHOUT CONTRAST TECHNIQUE: Multidetector CT imaging of the abdomen and pelvis was performed following the standard protocol without IV contrast. COMPARISON:  11/11/2017 FINDINGS: Lower chest: Small right and minimal left pleural effusions. Pleural effusions are new since the prior CT. Mild interstitial thickening at the lung bases as well as subsegmental atelectasis, greater on the right. Interstitial thickening is similar to the prior CT. No convincing pneumonia. Hepatobiliary: No focal liver abnormality is seen. No gallstones, gallbladder wall thickening, or biliary dilatation. Pancreas: Unremarkable. No pancreatic ductal dilatation or surrounding inflammatory changes. Spleen: Normal in size without focal abnormality. Adrenals/Urinary Tract: Residual contrast fills the moderately dilated left intrarenal collecting system and portions of the left ureter. There is mild dilation of the right intrarenal collecting system and portions of the right ureter. There is diffuse left renal cortical thinning. No renal masses. No visualized stones. No adrenal masses. Irregular thickening along the posterior aspect of the bladder, greater on the left, is similar to the prior CT. Stomach/Bowel: Stomach is unremarkable. The small bowel and colon show air-fluid levels, but are nondilated with no wall thickening or adjacent inflammation. This is consistent with a mild diffuse adynamic ileus. No evidence of obstruction. Mild colonic stool burden. Appendix not visualized. Vascular/Lymphatic: Aortic atherosclerosis. No enlarged abdominal or pelvic lymph nodes. Reproductive: Prostate gland is enlarged this, bulging against the posterior inferior bladder base,  stable from the recent CT. Other: No abdominal wall hernia or abnormality. No abdominopelvic ascites. Musculoskeletal: No fracture or acute finding. No osteoblastic or  osteolytic lesions. Stable changes from a previous L3-L4 posterior lumbar spine fusion. Chronic bilateral pars defects with a grade 1 anterolisthesis at L5-S1. IMPRESSION: 1. Moderate left hydronephrosis. There is residual contrast filling of the left intrarenal collecting system and portions of the left ureter. This is consistent with moderate obstructive uropathy, which appears due to an area of irregular bladder wall thickening or mass crossing the left ureteral trigone. 2. Mild right hydronephrosis. Right hydronephrosis has increased from the previous CT scan. Left hydronephrosis is unchanged. There is left perinephric and peri ureteral stranding, which is similar to the prior exam which may be the result of the obstruction only, but infection should also be considered. 3. Small right and minimal left pleural effusions. Mild lung base interstitial thickening. Consider mild congestive heart failure given the symptoms of shortness of breath. Electronically Signed   By: Lajean Manes M.D.   On: 11/28/2017 16:18   Dg Chest Port 1 View  Result Date: 11/28/2017 CLINICAL DATA:  Per order- SOBHis chronic constipation has worsened over the past several days. He has also felt very poorly, unable to get out of bed today and low-grade fevers. Pt states Sob today. EXAM: PORTABLE CHEST 1 VIEW COMPARISON:  None. FINDINGS: There changes from prior cardiac surgery. The cardiac silhouette is normal in size and configuration. No mediastinal or hilar masses. No convincing adenopathy. Clear lungs.  No pleural effusion or pneumothorax. Skeletal structures are intact. IMPRESSION: No active disease. Electronically Signed   By: Lajean Manes M.D.   On: 11/28/2017 15:59   Acute Abdominal Series  Result Date: 11/29/2017 CLINICAL DATA:  Initial evaluation for  constipation for 1 week. EXAM: DG ABDOMEN ACUTE W/ 1V CHEST COMPARISON:  Prior CT from 11/28/2017. FINDINGS: Median sternotomy wires underlying cardiomegaly. Mediastinal silhouette normal. Lungs hypoinflated. Mild diffuse pulmonary interstitial congestion without frank alveolar edema. No other focal infiltrates. No pleural effusion. No pneumothorax. Few scattered gas-filled loops of bowel seen throughout the abdomen without evidence for obstruction or ileus. No free air seen on lateral decubitus view. No significant bowel wall thickening. Overall stool burden is relatively mild. Soft tissue mass or abnormal calcification. Left-sided nephrostomy tube noted. Dextroscoliosis with postsurgical changes noted within the lower lumbar spine. IMPRESSION: 1. Nonobstructive bowel gas pattern with no radiographic evidence for acute intra-abdominal pathology. Overall stool burden is mild. 2. Percutaneous nephrostomy tube in place. 3. Stable cardiomegaly with mild diffuse pulmonary interstitial congestion without frank pulmonary edema. Electronically Signed   By: Jeannine Boga M.D.   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 Status post retrograde urography on 11/25/2017. At that time, contrast injection was performed but a ureteral stent could not be advanced to the level of the kidney. He now presents with increasing flank and back pain as well as fever with evidence of developing urosepsis. By CT, there is retained contrast material in the left renal collecting system that demonstrates significant hydronephrosis. EXAM: LEFT PERCUTANEOUS NEPHROSTOMY TUBE PLACEMENT. COMPARISON:  CT of the abdomen on 11/28/2017 and imaging from retrograde urography study on 11/25/2017 ANESTHESIA/SEDATION: 1.0 mg IV Versed; 50 mcg IV Fentanyl. Total  Moderate Sedation Time 6 minutes. The patient's level of consciousness and physiologic status were continuously monitored during the procedure by Radiology nursing. CONTRAST:  15 mL Isovue-300 injected into the renal collecting system. MEDICATIONS: IV vancomycin was being infused at the time  of the procedure. FLUOROSCOPY TIME:  1 minute and 42 seconds.  72 mGy. PROCEDURE: The procedure, risks, benefits, and alternatives were explained to the patient. Questions regarding the procedure were encouraged and answered. The patient understands and consents to the procedure. A time-out was performed prior to initiating the procedure. The left flank region was prepped with chlorhexidine in a sterile fashion, and a sterile drape was applied covering the operative field. A sterile gown and sterile gloves were used for the procedure. Local anesthesia was provided with 1% Lidocaine. Under fluoroscopy, a 21 gauge needle was advanced into the renal collecting system via a posterior lower pole calyx. Aspiration of urine sample was performed followed by contrast injection. A guidewire was advanced. A transitional dilator was advanced over the guidewire. Percutaneous tract dilatation was then performed over the guidewire. A 10 -French percutaneous nephrostomy tube was then advanced and formed in the collecting system. Catheter position was confirmed by fluoroscopy after contrast injection. The catheter was secured at the skin with a Prolene retention suture and Stat-Lock device. A urine sample was sent for culture analysis. A gravity bag was placed. COMPLICATIONS: None. FINDINGS: Severe hydronephrosis is present with retained contrast material in the left renal collecting system. After access of the lower pole, a nephrostomy tube was eventually placed and advanced into the renal pelvis. IMPRESSION: Placement of 10 French left percutaneous nephrostomy tube. A urine sample was sent for culture analysis. Electronically Signed   By:  Aletta Edouard M.D.   On: 11/30/2017 09:20        Scheduled Meds: . carvedilol  6.25 mg Oral BID WC  . cyanocobalamin  1,000 mcg Intramuscular Daily  . famotidine  20 mg Oral BID  . isosorbide mononitrate  60 mg Oral Daily  . lubiprostone  24 mcg Oral BID WC  . pantoprazole (PROTONIX) IV  40 mg Intravenous Q24H  . polyethylene glycol  17 g Oral BID  . pramipexole  0.25 mg Oral TID  . senna  1 tablet Oral BID  . sodium chloride flush  5 mL Intravenous Q8H   Continuous Infusions: . cefTRIAXone (ROCEPHIN)  IV Stopped (11/30/17 0044)  . vancomycin 1,000 mg (11/30/17 0057)     LOS: 2 days    Time spent: 35 minutes    Irine Seal, MD [facility] Pager [phone]  If 7PM-7AM, please contact night-coverage [email or web address] Password [email or web address] 11/30/2017, 12:02 PM

## 2017-11-30 NOTE — Progress Notes (Signed)
Patient c/o not being able to tolerate moviprep for scheduled colonoscopy on 12/5. Drank 1st bottle with no results. Pt wants to speak with Dr. Hilarie Fredrickson (Primary GI) regarding what to do next. Pt refusing to drank 2nd bottle. Dr Hilarie Fredrickson paged and spoke with pt; new orders placed.

## 2017-12-01 ENCOUNTER — Encounter (HOSPITAL_COMMUNITY)
Admission: EM | Disposition: A | Discharge status: Home / Self Care | Payer: Self-pay | Source: Home / Self Care | Attending: Family Medicine

## 2017-12-01 ENCOUNTER — Inpatient Hospital Stay (HOSPITAL_COMMUNITY): Payer: Medicare HMO | Admitting: Anesthesiology

## 2017-12-01 ENCOUNTER — Encounter (HOSPITAL_COMMUNITY): Payer: Self-pay | Admitting: Certified Registered"

## 2017-12-01 DIAGNOSIS — C20 Malignant neoplasm of rectum: Secondary | ICD-10-CM

## 2017-12-01 DIAGNOSIS — N12 Tubulo-interstitial nephritis, not specified as acute or chronic: Secondary | ICD-10-CM

## 2017-12-01 DIAGNOSIS — R509 Fever, unspecified: Secondary | ICD-10-CM

## 2017-12-01 DIAGNOSIS — K624 Stenosis of anus and rectum: Secondary | ICD-10-CM

## 2017-12-01 HISTORY — PX: COLONOSCOPY WITH PROPOFOL: SHX5780

## 2017-12-01 HISTORY — PX: LAPAROSCOPY: SHX197

## 2017-12-01 LAB — CBC WITH DIFFERENTIAL/PLATELET
Basophils Absolute: 0 10*3/uL (ref 0.0–0.1)
Basophils Relative: 0 %
Eosinophils Absolute: 0.1 10*3/uL (ref 0.0–0.7)
Eosinophils Relative: 2 %
HCT: 26.7 % — ABNORMAL LOW (ref 39.0–52.0)
Hemoglobin: 9.1 g/dL — ABNORMAL LOW (ref 13.0–17.0)
Lymphocytes Relative: 26 %
Lymphs Abs: 1.6 10*3/uL (ref 0.7–4.0)
MCH: 30.7 pg (ref 26.0–34.0)
MCHC: 34.1 g/dL (ref 30.0–36.0)
MCV: 90.2 fL (ref 78.0–100.0)
Monocytes Absolute: 0.8 10*3/uL (ref 0.1–1.0)
Monocytes Relative: 12 %
Neutro Abs: 3.8 10*3/uL (ref 1.7–7.7)
Neutrophils Relative %: 60 %
Platelets: 61 10*3/uL — ABNORMAL LOW (ref 150–400)
RBC: 2.96 MIL/uL — ABNORMAL LOW (ref 4.22–5.81)
RDW: 16.3 % — ABNORMAL HIGH (ref 11.5–15.5)
WBC: 6.3 10*3/uL (ref 4.0–10.5)

## 2017-12-01 LAB — BASIC METABOLIC PANEL
Anion gap: 8 (ref 5–15)
BUN: 22 mg/dL — ABNORMAL HIGH (ref 6–20)
CHLORIDE: 107 mmol/L (ref 101–111)
CO2: 21 mmol/L — AB (ref 22–32)
CREATININE: 1.56 mg/dL — AB (ref 0.61–1.24)
Calcium: 8.7 mg/dL — ABNORMAL LOW (ref 8.9–10.3)
GFR calc non Af Amer: 40 mL/min — ABNORMAL LOW (ref >60)
GFR, EST AFRICAN AMERICAN: 47 mL/min — AB (ref >60)
Glucose, Bld: 102 mg/dL — ABNORMAL HIGH (ref 65–99)
Potassium: 3.7 mmol/L (ref 3.5–5.1)
Sodium: 136 mmol/L (ref 135–145)

## 2017-12-01 LAB — TYPE AND SCREEN
ABO/RH(D): A POS
Antibody Screen: NEGATIVE

## 2017-12-01 LAB — ABO/RH: ABO/RH(D): A POS

## 2017-12-01 LAB — GLUCOSE, CAPILLARY: Glucose-Capillary: 103 mg/dL — ABNORMAL HIGH (ref 65–99)

## 2017-12-01 SURGERY — LAPAROSCOPY, DIAGNOSTIC
Anesthesia: General | Site: Abdomen

## 2017-12-01 SURGERY — COLONOSCOPY WITH PROPOFOL
Anesthesia: Monitor Anesthesia Care

## 2017-12-01 MED ORDER — LIDOCAINE HCL (PF) 1 % IJ SOLN
INTRAMUSCULAR | Status: AC
  Filled 2017-12-01: qty 30

## 2017-12-01 MED ORDER — OXYCODONE HCL 5 MG PO TABS: 5.0000 mg | ORAL_TABLET | ORAL | Status: DC | PRN

## 2017-12-01 MED ORDER — PROPOFOL 10 MG/ML IV BOLUS
INTRAVENOUS | Status: AC
  Filled 2017-12-01: qty 20

## 2017-12-01 MED ORDER — FENTANYL CITRATE (PF) 100 MCG/2ML IJ SOLN
INTRAMUSCULAR | Status: AC
  Filled 2017-12-01: qty 2

## 2017-12-01 MED ORDER — SUGAMMADEX SODIUM 200 MG/2ML IV SOLN
INTRAVENOUS | Status: DC | PRN
  Administered 2017-12-01: 200 mg via INTRAVENOUS

## 2017-12-01 MED ORDER — LACTATED RINGERS IV SOLN
INTRAVENOUS | Status: DC | PRN
  Administered 2017-12-01: 13:00:00 via INTRAVENOUS

## 2017-12-01 MED ORDER — ROCURONIUM BROMIDE 10 MG/ML (PF) SYRINGE
PREFILLED_SYRINGE | INTRAVENOUS | Status: DC | PRN
  Administered 2017-12-01: 50 mg via INTRAVENOUS
  Administered 2017-12-01: 10 mg via INTRAVENOUS

## 2017-12-01 MED ORDER — PROPOFOL 10 MG/ML IV BOLUS
INTRAVENOUS | Status: DC | PRN
  Administered 2017-12-01: 100 mg via INTRAVENOUS

## 2017-12-01 MED ORDER — LUBIPROSTONE 24 MCG PO CAPS
24.0000 ug | ORAL_CAPSULE | Freq: Two times a day (BID) | ORAL | Status: DC
  Administered 2017-12-04: 24 ug via ORAL
  Filled 2017-12-01 (×2): qty 1

## 2017-12-01 MED ORDER — PHENYLEPHRINE HCL-NACL 10-0.9 MG/250ML-% IV SOLN
INTRAVENOUS | Status: AC
  Filled 2017-12-01: qty 250

## 2017-12-01 MED ORDER — FENTANYL CITRATE (PF) 100 MCG/2ML IJ SOLN
25.0000 ug | INTRAMUSCULAR | Status: DC | PRN
  Administered 2017-12-01 (×2): 25 ug via INTRAVENOUS
  Administered 2017-12-01 (×2): 50 ug via INTRAVENOUS

## 2017-12-01 MED ORDER — PRAMIPEXOLE DIHYDROCHLORIDE 0.25 MG PO TABS
0.2500 mg | ORAL_TABLET | Freq: Three times a day (TID) | ORAL | Status: DC
  Administered 2017-12-01 – 2017-12-04 (×6): 0.25 mg via ORAL
  Filled 2017-12-01 (×7): qty 1

## 2017-12-01 MED ORDER — PHENYLEPHRINE HCL 10 MG/ML IJ SOLN
INTRAVENOUS | Status: DC | PRN
  Administered 2017-12-01: 25 ug/min via INTRAVENOUS

## 2017-12-01 MED ORDER — FENTANYL CITRATE (PF) 100 MCG/2ML IJ SOLN
INTRAMUSCULAR | Status: AC
  Administered 2017-12-01: 25 ug via INTRAVENOUS
  Filled 2017-12-01: qty 2

## 2017-12-01 MED ORDER — DEXAMETHASONE SODIUM PHOSPHATE 10 MG/ML IJ SOLN
INTRAMUSCULAR | Status: DC | PRN
  Administered 2017-12-01: 10 mg via INTRAVENOUS

## 2017-12-01 MED ORDER — OXYCODONE HCL 5 MG PO TABS
5.0000 mg | ORAL_TABLET | ORAL | Status: DC | PRN
  Administered 2017-12-03 (×2): 5 mg via ORAL
  Administered 2017-12-04: 10 mg via ORAL
  Filled 2017-12-01: qty 2
  Filled 2017-12-01 (×2): qty 1

## 2017-12-01 MED ORDER — ONDANSETRON HCL 4 MG/2ML IJ SOLN
INTRAMUSCULAR | Status: DC | PRN
  Administered 2017-12-01: 4 mg via INTRAVENOUS

## 2017-12-01 MED ORDER — LACTATED RINGERS IV SOLN
INTRAVENOUS | Status: AC | PRN
  Administered 2017-12-01: 1000 mL via INTRAVENOUS

## 2017-12-01 MED ORDER — SODIUM CHLORIDE 0.9 % IV SOLN: INTRAVENOUS | Status: DC

## 2017-12-01 MED ORDER — MEPERIDINE HCL 50 MG/ML IJ SOLN: 6.2500 mg | INTRAMUSCULAR | Status: DC | PRN

## 2017-12-01 MED ORDER — PROPOFOL 10 MG/ML IV BOLUS
INTRAVENOUS | Status: DC | PRN
  Administered 2017-12-01: 10 mg via INTRAVENOUS

## 2017-12-01 MED ORDER — TRAZODONE HCL 50 MG PO TABS: 50.0000 mg | ORAL_TABLET | Freq: Every day | ORAL | Status: DC

## 2017-12-01 MED ORDER — LACTATED RINGERS IV SOLN
INTRAVENOUS | Status: DC | PRN
  Administered 2017-12-01: 09:00:00 via INTRAVENOUS

## 2017-12-01 MED ORDER — PROMETHAZINE HCL 25 MG/ML IJ SOLN: 6.2500 mg | INTRAMUSCULAR | Status: DC | PRN

## 2017-12-01 MED ORDER — PROPOFOL 500 MG/50ML IV EMUL
INTRAVENOUS | Status: DC | PRN
  Administered 2017-12-01: 100 ug/kg/min via INTRAVENOUS

## 2017-12-01 MED ORDER — SODIUM CHLORIDE 0.9 % IJ SOLN
INTRAMUSCULAR | Status: AC
  Filled 2017-12-01: qty 50

## 2017-12-01 MED ORDER — PROPOFOL 10 MG/ML IV BOLUS
INTRAVENOUS | Status: AC
Start: 2017-12-01 — End: ?
  Filled 2017-12-01: qty 20

## 2017-12-01 MED ORDER — BUPIVACAINE-EPINEPHRINE (PF) 0.25% -1:200000 IJ SOLN
INTRAMUSCULAR | Status: AC
Start: 2017-12-01 — End: ?
  Filled 2017-12-01: qty 30

## 2017-12-01 MED ORDER — LIDOCAINE 2% (20 MG/ML) 5 ML SYRINGE
INTRAMUSCULAR | Status: DC | PRN
  Administered 2017-12-01 (×2): 50 mg via INTRAVENOUS

## 2017-12-01 MED ORDER — CYANOCOBALAMIN 1000 MCG/ML IJ SOLN: 1000.0000 ug | Freq: Every day | INTRAMUSCULAR | Status: DC

## 2017-12-01 MED ORDER — BUPIVACAINE LIPOSOME 1.3 % IJ SUSP
20.0000 mL | INTRAMUSCULAR | Status: AC
  Administered 2017-12-01: 20 mL
  Filled 2017-12-01: qty 20

## 2017-12-01 MED ORDER — FENTANYL CITRATE (PF) 250 MCG/5ML IJ SOLN
INTRAMUSCULAR | Status: AC
  Filled 2017-12-01: qty 5

## 2017-12-01 MED ORDER — MIDAZOLAM HCL 2 MG/2ML IJ SOLN
INTRAMUSCULAR | Status: AC
  Filled 2017-12-01: qty 2

## 2017-12-01 MED ORDER — 0.9 % SODIUM CHLORIDE (POUR BTL) OPTIME
TOPICAL | Status: DC | PRN
  Administered 2017-12-01: 2000 mL

## 2017-12-01 MED ORDER — MORPHINE SULFATE (PF) 4 MG/ML IV SOLN
1.0000 mg | INTRAVENOUS | Status: DC | PRN
  Administered 2017-12-01: 3 mg via INTRAVENOUS
  Administered 2017-12-03 – 2017-12-04 (×5): 2 mg via INTRAVENOUS
  Filled 2017-12-01 (×6): qty 1

## 2017-12-01 MED ORDER — PHENYLEPHRINE HCL 10 MG/ML IJ SOLN
INTRAMUSCULAR | Status: DC | PRN
  Administered 2017-12-01: 40 ug via INTRAVENOUS

## 2017-12-01 MED ORDER — FLEET ENEMA 7-19 GM/118ML RE ENEM
ENEMA | RECTAL | Status: AC
  Filled 2017-12-01: qty 1

## 2017-12-01 MED ORDER — ACETAMINOPHEN 10 MG/ML IV SOLN
1000.0000 mg | Freq: Four times a day (QID) | INTRAVENOUS | Status: DC
  Administered 2017-12-01 – 2017-12-02 (×3): 1000 mg via INTRAVENOUS
  Filled 2017-12-01 (×4): qty 100

## 2017-12-01 MED ORDER — FAMOTIDINE IN NACL 20-0.9 MG/50ML-% IV SOLN
20.0000 mg | Freq: Two times a day (BID) | INTRAVENOUS | Status: DC
  Administered 2017-12-01 – 2017-12-03 (×5): 20 mg via INTRAVENOUS
  Filled 2017-12-01 (×6): qty 50

## 2017-12-01 MED ORDER — PRAMIPEXOLE DIHYDROCHLORIDE 0.25 MG PO TABS: 0.2500 mg | ORAL_TABLET | Freq: Three times a day (TID) | ORAL | Status: DC

## 2017-12-01 MED ORDER — LACTATED RINGERS IR SOLN
Status: DC | PRN
  Administered 2017-12-01: 1000 mL

## 2017-12-01 MED ORDER — DEXAMETHASONE SODIUM PHOSPHATE 10 MG/ML IJ SOLN
INTRAMUSCULAR | Status: DC | PRN
  Administered 2017-12-01: 4 mg via INTRAVENOUS

## 2017-12-01 MED ORDER — FENTANYL CITRATE (PF) 250 MCG/5ML IJ SOLN
INTRAMUSCULAR | Status: DC | PRN
  Administered 2017-12-01 (×5): 50 ug via INTRAVENOUS

## 2017-12-01 SURGICAL SUPPLY — 48 items
BARRIER SKIN 2 3/4 (OSTOMY) ×2 IMPLANT
BENZOIN TINCTURE PRP APPL 2/3 (GAUZE/BANDAGES/DRESSINGS) IMPLANT
COVER SURGICAL LIGHT HANDLE (MISCELLANEOUS) ×2 IMPLANT
DECANTER SPIKE VIAL GLASS SM (MISCELLANEOUS) IMPLANT
DERMABOND ADVANCED (GAUZE/BANDAGES/DRESSINGS) ×1
DERMABOND ADVANCED .7 DNX12 (GAUZE/BANDAGES/DRESSINGS) ×1 IMPLANT
DRAPE LG THREE QUARTER DISP (DRAPES) ×2 IMPLANT
DRAPE TOWEL STR TPT 18X26 WHT (DRAPES) ×4 IMPLANT
ELECT REM PT RETURN 15FT ADLT (MISCELLANEOUS) ×2 IMPLANT
GLOVE BIO SURGEON STRL SZ 6 (GLOVE) ×2 IMPLANT
GLOVE INDICATOR 6.5 STRL GRN (GLOVE) ×4 IMPLANT
GOWN STRL REIN 2XL LVL4 (GOWN DISPOSABLE) ×2 IMPLANT
GOWN STRL REUS W/ TWL XL LVL3 (GOWN DISPOSABLE) ×4 IMPLANT
GOWN STRL REUS W/TWL 2XL LVL3 (GOWN DISPOSABLE) ×2 IMPLANT
GOWN STRL REUS W/TWL XL LVL3 (GOWN DISPOSABLE) ×4
HOLDER FOLEY CATH W/STRAP (MISCELLANEOUS) ×2 IMPLANT
IRRIG SUCT STRYKERFLOW 2 WTIP (MISCELLANEOUS)
IRRIGATION SUCT STRKRFLW 2 WTP (MISCELLANEOUS) IMPLANT
KIT BASIN OR (CUSTOM PROCEDURE TRAY) ×2 IMPLANT
L-HOOK LAP DISP 36CM (ELECTROSURGICAL) ×2
LHOOK LAP DISP 36CM (ELECTROSURGICAL) ×1 IMPLANT
POUCH OSTOMY 2 3/4  H 3804 (WOUND CARE) ×1
POUCH OSTOMY 2 PC DRNBL 2.75 (WOUND CARE) ×1 IMPLANT
SEALER TISSUE G2 STRG ARTC 35C (ENDOMECHANICALS) ×2 IMPLANT
SET IRRIG TUBING LAPAROSCOPIC (IRRIGATION / IRRIGATOR) ×2 IMPLANT
SHEARS HARMONIC ACE PLUS 36CM (ENDOMECHANICALS) IMPLANT
SLEEVE XCEL OPT CAN 5 100 (ENDOMECHANICALS) ×4 IMPLANT
SOLUTION ANTI FOG 6CC (MISCELLANEOUS) IMPLANT
STRIP CLOSURE SKIN 1/2X4 (GAUZE/BANDAGES/DRESSINGS) IMPLANT
SUT MNCRL AB 4-0 PS2 18 (SUTURE) ×2 IMPLANT
SUT SILK 2 0 (SUTURE) ×1
SUT SILK 2 0 SH CR/8 (SUTURE) ×2 IMPLANT
SUT SILK 2-0 18XBRD TIE 12 (SUTURE) ×1 IMPLANT
SUT SILK 3 0 (SUTURE) ×1
SUT SILK 3 0 SH CR/8 (SUTURE) ×2 IMPLANT
SUT SILK 3-0 18XBRD TIE 12 (SUTURE) ×1 IMPLANT
SUT VIC AB 4-0 PS2 27 (SUTURE) IMPLANT
SUT VIC AB 4-0 SH 18 (SUTURE) ×4 IMPLANT
TOWEL OR 17X26 10 PK STRL BLUE (TOWEL DISPOSABLE) ×2 IMPLANT
TRAY FOLEY W/METER SILVER 16FR (SET/KITS/TRAYS/PACK) ×2 IMPLANT
TRAY LAPAROSCOPIC (CUSTOM PROCEDURE TRAY) ×2 IMPLANT
TROCAR XCEL BLUNT TIP 100MML (ENDOMECHANICALS) ×2 IMPLANT
TROCAR XCEL NON-BLD 11X100MML (ENDOMECHANICALS) ×2 IMPLANT
TROCAR XCEL NON-BLD 5MMX100MML (ENDOMECHANICALS) ×2 IMPLANT
TROCAR XCEL UNIV SLVE 11M 100M (ENDOMECHANICALS) IMPLANT
TUBING INSUF HEATED (TUBING) ×2 IMPLANT
WATER STERILE IRR 1000ML POUR (IV SOLUTION) IMPLANT
YANKAUER SUCT BULB TIP 10FT TU (MISCELLANEOUS) ×2 IMPLANT

## 2017-12-01 SURGICAL SUPPLY — 21 items

## 2017-12-01 NOTE — Progress Notes (Signed)
PROGRESS NOTE    Charles Coffey  YKZ:[id number] DOB: 1935/01/07 DOA: 11/28/2017 PCP: [name], MD   Brief Narrative:  Patient is Charles Coffey 82 year old gentleman history of invasive urothelial carcinoma involving left urethra and trigone status post recent cystoscopy and left retrograde pyelogram with TURBT on 11/25/2017, history of hypertension hyperlipidemia presenting to the ED with fever as high as 101, constipation noted to have fever, ileus, moderate bilateral hydronephrosis.  Patient underwent left percutaneous nephrostomy on admission.  Urology following.  Oncology consulted.  Assessment & Plan:   Principal Problem:   Fever Active Problems:   Hyperlipidemia LDL goal <70   RESTLESS LEGS SYNDROME   Essential hypertension   Coronary artery disease of native heart with stable angina pectoris (HCC)   GERD   Cardiomyopathy, ischemic   Chronic low back pain   PAD (peripheral artery disease) (HCC)   Anemia   Stage 3 chronic kidney disease (HCC)   Hydronephrosis due to obstructive malignant neoplasm of bladder (HCC)   Pyelonephritis   Ileus (HCC)   Elevated troponin   Urothelial carcinoma of bladder (HCC)   Dehydration   Thrombocytopenia (HCC)   Low vitamin B12 level   Irritable bowel syndrome with constipation   Rectal stenosis   #1 fever Thought secondary to acute pyelonephritis as patient with recent instrumentation in the bladder with cystoscopy, left retrograde pyelogram and TURBT on 11/25/2017. Patient also noted to have left flank pain on examination.   Urine cultures with insignificant and no growth.  Blood cultures NGTD x 3 days. Chest x-ray negative for any acute infiltrate. CT abdomen and pelvis with perinephric stranding on the left and periurethral stranding as well as moderate bilateral hydronephrosis. Continue empiric IV Rocephin.  vancoymcin has been discontinued. Consider transition to oral antibiotics to complete Ayanah Snader 10-14-day course of antibiotic treatment as  able.Follow.  2. Probable left-sided pyelonephritis The patient presented with fevers, malaise. Patient with recent instrumentation the bladder with cystoscopy, left retrograde pyelogram and TURBT on 11/25/2017. Patient also noted on examination to have left flank pain.   Urine and blood cx as above.  Clinical improvement.Continue empiric IV Rocephin. Follow.  3. Bilateral hydronephrosis/malignant obstruction of left kidney from invasive urothelial carcinoma Patient has been seen in consultation by urology. Urology has consulted interventional radiology and patient status post left percutaneous nephrostomy tube placement 11/28/2017 per Dr. Kathlene Cote. Urinalysis has been obtained Norita Meigs urine cultures from [facility] and post nephrostomy are as above.  Continue empiric IV Rocephin. Per IR and urology.  4. Elevated troponin Likely demand ischemia. Patient had one episode of chest pain Geza Beranek day prior to admission which has since resolved. EKG with Rachelann Enloe right bundle branch block with left anterior fascicular block with no significant change from prior EKG. Patient with recent 2D echo done in September 2018 as such we will not repeat at this time.   Cardiac enzymes trending down.  No further workup at this time.  Continue home regimen of cardiac medications. Previous provider [facility] cardiology who are in agreement.   5. Ileus Likely secondary to problem #3.  Clinical improvement after nephrostomy tube placement. Magnesium level at 2.7.  Potassium at 4.3.  Acute abdominal series from 11/29/2017 with resolution of ileus.  Patient tolerating soft diet.  She received [place] Dulcolax suppository yesterday and also placed on Senokot-S and however states no bowel movement.  Patient complained of lower abdominal pain that he feels may be his IBS.  MiraLAX twice daily was ordered however patient refused.  Repeat abdominal  films today.  Follow.    #6 history of IBS Patient complaint of lower abdominal cramping and  discomfort.  Patient states has not had Yojan Paskett bowel movement.  On presentation patient noted to have an ileus was placed on bowel rest and it does seem to have resolved.  Patient with positive bowel sounds and tolerating oral intake.  Will repeat KUB.  Continue home regimen of Amitiza.  - GI c/s  3 Rectal Stenosis - seen on colonoscopy, biopsies pending.  GI suspects invasion from bladder cancer. - Appreciate surgery c/s  7. Chronic kidney disease stage III Stable. Follow.  8. Dehydration IV fluids.  9. Hypertension Blood pressure stable.  Continue current regimen of imdur and Coreg.   10. Coronary artery disease/history of cardiomyopathy Patient noted to have elevated troponins.  Enzymes were elevated however trending down.  Likely secondary to acute illness.Patient currently asymptomatic. Patient with Daryle Amis recent 2D echo and as such we will not repeat at this time.  Continue current home oral cardiac medications.  Outpatient follow-up.  #11 anemia No overt bleeding. Likely secondary to anemia of chronic disease. Transfusion threshold hemoglobin less than 7.  H&H has remained stable.  Follow.  12. Thrombocytopenia Questionable etiology. Likely secondary to acute infection.  LDH at 210. Haptoglobin at 233. Patient has been seen by hematology/oncology who recommend monitoring at this time.  Follow.   13. Urothelial carcinoma of the bladder Patient being followed by urology.  Oncology has been consulted and are following along with urology.  Per urology.  #14 low vitamin B12 levels Started on B12 injections, but 12/3 value normal.  Will d/c B12 injections.  Can consider repeat B12/MMA as outpatient.   DVT prophylaxis: SCD Code Status: full Family Communication: wife at bedside Disposition Plan: pending   Consultants:   IR  Urology  Oncology  GI  Surgery  Procedures: (Don't include imaging studies which can be auto populated. Include things that cannot be  auto populated i.e. Echo, Carotid and venous dopplers, Foley, Bipap, HD, tubes/drains, wound vac, central lines etc)  Perc nephrostomy 12/2  Antimicrobials: (specify start and planned stop date. Auto populated tables are space occupying and do not give end dates) Anti-infectives (From admission, onward)   Start     Dose/Rate Route Frequency Ordered Stop   11/29/17 0000  cefTRIAXone (ROCEPHIN) 2 g in dextrose 5 % 50 mL IVPB  Status:  Discontinued     2 g 100 mL/hr over 30 Minutes Intravenous Every 24 hours 11/28/17 1921 12/01/17 1631   11/29/17 0000  vancomycin (VANCOCIN) IVPB 1000 mg/200 mL premix  Status:  Discontinued     1,000 mg 200 mL/hr over 60 Minutes Intravenous Every 24 hours 11/28/17 1934 11/30/17 1229   11/28/17 1745  cefTRIAXone (ROCEPHIN) 2 g in dextrose 5 % 50 mL IVPB  Status:  Discontinued     2 g 100 mL/hr over 30 Minutes Intravenous  Once 11/28/17 1734 11/28/17 1735   11/28/17 1745  piperacillin-tazobactam (ZOSYN) IVPB 3.375 g     3.375 g 100 mL/hr over 30 Minutes Intravenous  Once 11/28/17 1734 11/28/17 1902   11/28/17 1745  vancomycin (VANCOCIN) IVPB 1000 mg/200 mL premix     1,000 mg 200 mL/hr over 60 Minutes Intravenous  Once 11/28/17 1734 11/28/17 2044        Subjective: Feeling well. No c/o this morning.  Wants to get surgery done. Able to walk up stairs without CP.   Objective: Vitals:   12/01/17 1545 12/01/17 1600 12/01/17  1615 12/01/17 1621  BP: (!) 100/58 109/67 119/74 118/68  Pulse: 72 71 70 70  Resp: 16 15 14 18   Temp:  98.3 F (36.8 C)  97.8 F (36.6 C)  TempSrc:      SpO2: 99% 98% 97% 99%  Weight:      Height:        Intake/Output Summary (Last 24 hours) at 12/01/2017 1943 Last data filed at 12/01/2017 1800 Gross per 24 hour  Intake 1555 ml  Output 705 ml  Net 850 ml   Filed Weights   11/30/17 1200 12/01/17 0544 12/01/17 0819  Weight: 88 kg (194 lb 0.1 oz) 90.3 kg (199 lb 1.2 oz) 88 kg (194 lb)    Examination:  General exam:  Appears calm and comfortable  Respiratory system: Clear to auscultation. Respiratory effort normal. Cardiovascular system: S1 & S2 heard, RRR. No JVD, murmurs, rubs, gallops or clicks. No pedal edema. Gastrointestinal system: Abdomen is nondistended, soft and nontender. No organomegaly or masses felt. Normal bowel sounds heard. Central nervous system: Alert and oriented. No focal neurological deficits. Extremities: Symmetric 5 x 5 power. Skin: No rashes, lesions or ulcers Psychiatry: Judgement and insight appear normal. Mood & affect appropriate.     Data Reviewed: I have personally reviewed following labs and imaging studies  CBC: Recent Labs  Lab 11/28/17 1518 11/29/17 0540 11/30/17 0555 12/01/17 0645  WBC 9.9 6.8 6.8 6.3  NEUTROABS 6.9  --   --  3.8  HGB 9.4* 8.1* 9.2* 9.1*  HCT 27.5* 24.3* 27.1* 26.7*  MCV 91.4 90.7 90.3 90.2  PLT 72* 59* 60* 61*   Basic Metabolic Panel: Recent Labs  Lab 11/26/17 0526 11/28/17 1518 11/29/17 0540 11/29/17 0820 11/30/17 0555 12/01/17 0645  NA 137 133* 134*  --  132* 136  K 5.3* 4.3 4.4  --  4.3 3.7  CL 108 104 105  --  103 107  CO2 22 20* 21*  --  22 21*  GLUCOSE 157* 100* 98  --  114* 102*  BUN 19 24* 26*  --  24* 22*  CREATININE 1.58* 1.56* 1.55*  --  1.49* 1.56*  CALCIUM 9.3 9.1 8.5*  --  9.0 8.7*  MG  --   --  2.3 2.7*  --   --    GFR: Estimated Creatinine Clearance: 41.1 mL/min ([place] [place]) (by C-G formula based on SCr of 1.56 mg/dL (H)). Liver Function Tests: Recent Labs  Lab 11/28/17 1518  AST 23  ALT 18  ALKPHOS 51  BILITOT 1.1  PROT 6.7  ALBUMIN 4.0   No results for input(s): LIPASE, AMYLASE in the last 168 hours. No results for input(s): AMMONIA in the last 168 hours. Coagulation Profile: No results for input(s): INR, PROTIME in the last 168 hours. Cardiac Enzymes: Recent Labs  Lab 11/28/17 1922 11/29/17 0112 11/29/17 0710  TROPONINI 0.20* 0.17* 0.13*   BNP (last 3 results) No results for input(s): PROBNP in  the last 8760 hours. HbA1C: No results for input(s): HGBA1C in the last 72 hours. CBG: Recent Labs  Lab 11/30/17 0751 12/01/17 0741  GLUCAP 107* 103*   Lipid Profile: No results for input(s): CHOL, HDL, LDLCALC, TRIG, CHOLHDL, LDLDIRECT in the last 72 hours. Thyroid Function Tests: No results for input(s): TSH, T4TOTAL, FREET4, T3FREE, THYROIDAB in the last 72 hours. Anemia Panel: Recent Labs    11/29/17 0540 11/29/17 0820  VITAMINB12  --  474  FOLATE  --  15.8  FERRITIN  --  185  TIBC  --  274  IRON  --  27*  RETICCTPCT 2.0  --    Sepsis Labs: Recent Labs  Lab 11/28/17 1538  LATICACIDVEN 0.72    Recent Results (from the past 240 hour(s))  Culture, blood (Routine X 2) w Reflex to ID Panel     Status: None (Preliminary result)   Collection Time: 11/28/17  3:18 PM  Result Value Ref Range Status   Specimen Description BLOOD BLOOD RIGHT HAND  Final   Special Requests   Final    BOTTLES DRAWN AEROBIC AND ANAEROBIC Blood Culture adequate volume   Culture   Final    NO GROWTH 3 DAYS Performed at [facility], [place] [address], [place], [place] [address]    Report Status PENDING  Incomplete  Culture, blood (Routine X 2) w Reflex to ID Panel     Status: None (Preliminary result)   Collection Time: 11/28/17  3:23 PM  Result Value Ref Range Status   Specimen Description BLOOD BLOOD LEFT FOREARM  Final   Special Requests   Final    BOTTLES DRAWN AEROBIC AND ANAEROBIC Blood Culture adequate volume   Culture   Final    NO GROWTH 3 DAYS Performed at [facility], [place] [address], [place], [place] [address]    Report Status PENDING  Incomplete  Culture, Urine     Status: Abnormal   Collection Time: 11/28/17  6:18 PM  Result Value Ref Range Status   Specimen Description URINE, CLEAN CATCH  Final   Special Requests NONE  Final   Culture (Deidrick Rainey)  Final    <10,000 COLONIES/mL INSIGNIFICANT GROWTH Performed at [facility] [address], [place], [place]  [address]    Report Status 11/30/2017 FINAL  Final  Urine culture     Status: None   Collection Time: 11/28/17  8:18 PM  Result Value Ref Range Status   Specimen Description KIDNEY  Final   Special Requests   Final    ASPIRATE OF URINE FROM LEFT RENAL COLLECTING SYSTEM   Culture   Final    NO GROWTH Performed at [facility], [place] [address] [place], [place] [address]    Report Status 11/30/2017 FINAL  Final         Radiology Studies: Dg Abd 2 Views  Result Date: 11/30/2017 CLINICAL DATA:  Abdominal bloating, rectal pain with bowel movements. EXAM: ABDOMEN - 2 VIEW COMPARISON:  Abdominal radiographs of November 29, 2017 FINDINGS: There remain multiple air in fluid levels within loops of colon in the mid and upper abdomen bilaterally. There are few small air-fluid levels in the mid abdomen. No significant rectal gas is observed. No free extraluminal gas collections are demonstrated. Trenita Hulme left nephrostomy tube appears stable. There are postsurgical changes in the lower lumbar spine. There is stable moderate dextrocurvature centered at L2-3. IMPRESSION: There has been distal migration of small bowel gas into the colon. No evidence of obstruction is evident. There is no significant rectal gas however. There is no evidence of perforation. Electronically Signed   By: David  Martinique M.D.   On: 11/30/2017 14:49        Scheduled Meds: . carvedilol  6.25 mg Oral BID WC  . [START ON 12/03/2017] cyanocobalamin  1,000 mcg Intramuscular Daily  . fentaNYL      . isosorbide mononitrate  60 mg Oral Daily  . [START ON 12/04/2017] lubiprostone  24 mcg Oral BID WC  . [START ON  12/02/2017] pramipexole  0.25 mg Oral TID  . sodium chloride flush  5 mL Intravenous Q8H   Continuous Infusions: . acetaminophen Stopped (12/01/17 1818)  . famotidine (PEPCID) IV    . phenylephrine       LOS: 3 days    Time spent: over 30 minutes    Fayrene Helper, MD [facility] Pager [phone]  If  7PM-7AM, please contact night-coverage [email or web address] Password [facility] 12/01/2017, 7:43 PM

## 2017-12-01 NOTE — Anesthesia Postprocedure Evaluation (Signed)
Anesthesia Post Note  Patient: Charles Coffey  Procedure(s) Performed: LAPAROSCOPIC DIVERTING OSTOMY (N/A Abdomen)     Patient location during evaluation: PACU Anesthesia Type: General Level of consciousness: awake and alert Pain management: pain level controlled Vital Signs Assessment: post-procedure vital signs reviewed and stable Respiratory status: spontaneous breathing, nonlabored ventilation, respiratory function stable and patient connected to nasal cannula oxygen Cardiovascular status: blood pressure returned to baseline and stable Postop Assessment: no apparent nausea or vomiting Anesthetic complications: no    Last Vitals:  Vitals:   12/01/17 1615 12/01/17 1621  BP: 119/74 118/68  Pulse: 70 70  Resp: 14 18  Temp:  36.6 C  SpO2: 97% 99%    Last Pain:  Vitals:   12/01/17 1600  TempSrc:   PainSc: 3                  Effie Berkshire

## 2017-12-01 NOTE — Anesthesia Preprocedure Evaluation (Addendum)
Anesthesia Evaluation  Patient identified by MRN, date of birth, ID band Patient awake    Reviewed: Allergy & Precautions, NPO status , Patient's Chart, lab work & pertinent test results  Airway Mallampati: II  TM Distance: >3 FB Neck ROM: Full    Dental no notable dental hx. (+) Dental Advisory Given, Caps, Chipped   Pulmonary neg pulmonary ROS, former smoker,    Pulmonary exam normal breath sounds clear to auscultation       Cardiovascular hypertension, Pt. on medications and Pt. on home beta blockers + angina + CAD, + Past MI, + CABG and + Peripheral Vascular Disease  Normal cardiovascular exam+ dysrhythmias + Valvular Problems/Murmurs MR  Rhythm:Regular Rate:Normal  Stress MPS 07/2017 Blood pressure demonstrated a normal response to exercise. There was no ST segment deviation noted during stress. Defect 1: There is a medium defect of severe severity present in the mid anterior, apical anterior and apical septal location. Defect 2: There is a large defect of severe severity present in the basal inferior, basal inferolateral, mid inferior, mid inferolateral, apical inferior and apical lateral location. Findings consistent with ischemia and prior myocardial infarction. This is a high risk study. The left ventricular ejection fraction is moderately decreased (30-44%).   This is a high risk study, there are two defects: 1. A medium size, severe severity nonreversible defect in the apical anterior, septal and mid anterior walls consistent with a prior scar in the distal LAD territory. 2. A large area, severe severity reversible defect in the basal and mid inferolateral, inferior and apical inferior and lateral walls consistent with ischemia in the LCX territory (SDS = 14).  A cardiac catheterization is recommended. The results were discussed with Melina Copa, NP on 08/23/17 at 1 pm.   [facility] 07/2017 LM lesion, 40 %stenosed. Prox  Cx to Mid Cx lesion, 100 %stenosed. Mid LAD lesion, 100 %stenosed. Ost LAD to Prox LAD lesion, 40 %stenosed. Prox RCA to Mid RCA lesion, 30 %stenosed. Dist RCA lesion, 100 %stenosed. SVG: The graft exhibits minimal luminal irregularities. LIMA. Prox Graft lesion, 100 %stenosed. LIMA and is normal in caliber and anatomically normal.   1. Significant underlying three-vessel coronary artery disease with patent LIMA to LAD and SVG to OM 2. RIMA to RCA is atretic. The right coronary artery is occluded distally with extensive collaterals from the LAD.  2. Mildly elevated left ventricular end-diastolic pressure at 15 mmHg.   Echo 08/2017  - Left ventricle: The cavity size was normal. Wall thickness was normal. Akinetic apex. Apical septal and apical lateral akinesis. The estimated ejection fraction was 50%. Features are consistent with a pseudonormal left ventricular filling pattern, with concomitant abnormal relaxation and increased filling pressure (grade 2 diastolic dysfunction). - Aortic valve: Trileaflet; moderately calcified leaflets. There was no stenosis. - Mitral valve: Mildly calcified annulus. There was moderate regurgitation. - Left atrium: The atrium was moderately dilated. - Right ventricle: The cavity size was normal. Systolic function was normal. - Tricuspid valve: Peak RV-RA gradient (S): 21 mm Hg. - Pulmonary arteries: PA peak pressure: 24 mm Hg (S). - Inferior vena cava: The vessel was normal in size. The   respirophasic diameter changes were in the normal range (>= 50%), consistent with normal central venous pressure.  Impressions: - Normal LV size with EF 50%. Apical wall motion abnormalities as noted above. Moderate diastolic dysfunction. Moderate mitral regurgitation. Normal RV size and systolic function.   Neuro/Psych negative psych ROS   GI/Hepatic Neg liver ROS, hiatal hernia, GERD  ,  Abnormal rectal exam, CT and constipation.   Endo/Other  negative  endocrine ROS  Renal/GU CRFRenal disease     Musculoskeletal  (+) Arthritis ,   Abdominal   Peds  Hematology  (+) Blood dyscrasia (Thrombocytopenia), anemia ,   Anesthesia Other Findings   Reproductive/Obstetrics negative OB ROS                           Anesthesia Physical  Anesthesia Plan  ASA: IV  Anesthesia Plan: General   Post-op Pain Management:    Induction: Intravenous  PONV Risk Score and Plan: 2 and Ondansetron, Treatment may vary due to age or medical condition and Dexamethasone  Airway Management Planned: Oral ETT  Additional Equipment:   Intra-op Plan:   Post-operative Plan: Extubation in OR  Informed Consent: I have reviewed the patients History and Physical, chart, labs and discussed the procedure including the risks, benefits and alternatives for the proposed anesthesia with the patient or authorized representative who has indicated his/her understanding and acceptance.   Dental advisory given  Plan Discussed with: CRNA  Anesthesia Plan Comments:         Anesthesia Quick Evaluation

## 2017-12-01 NOTE — Anesthesia Procedure Notes (Signed)
Procedure Name: Intubation Date/Time: 12/01/2017 1:05 PM Performed by: Cynda Familia, CRNA Pre-anesthesia Checklist: Patient identified, Emergency Drugs available, Suction available and Patient being monitored Patient Re-evaluated:Patient Re-evaluated prior to induction Oxygen Delivery Method: Circle System Utilized Preoxygenation: Pre-oxygenation with 100% oxygen Induction Type: IV induction Ventilation: Mask ventilation without difficulty Laryngoscope Size: Miller and 2 Grade View: Grade I Tube type: Oral Number of attempts: 1 Airway Equipment and Method: Stylet Placement Confirmation: ETT inserted through vocal cords under direct vision,  positive ETCO2 and breath sounds checked- equal and bilateral Secured at: 23 cm Tube secured with: Tape Dental Injury: Teeth and Oropharynx as per pre-operative assessment  Comments: Smooth IV induction [place]-- intubation AM CRNA -- atraumatic-- teeth intact upper--- chipped lower as present prior to laryngoscopy-- teeth and mouth unchanged-- bilat BS

## 2017-12-01 NOTE — Consult Note (Signed)
[facility] Nurse ostomy consult note  [facility] Nurse requested for preoperative stoma site marking by Dr. Barry Dienes.  Discussed surgical procedure and stoma creation with patient. Explained role of the [facility] nurse team.   Examined patient lying and sitting, marking placed in the patient's visual field, away from any creases or abdominal contour issues and within the rectus muscle.   Marked for colostomy in the LUQ  5cm to the left of the umbilicus and 2.5WL above the umbilicus.   If a site is needed on the right, the same location on the right (5cm to the right of the umbilicus and 8.9HT to the right of the umbilicus in the RUQ) would be preferred.  Patient will be unable to visualize ostomy in the lower quadrants.  Patient's abdomen cleansed x2 with CHG wipes at site marking, allowed to air dry prior to marking. Covered mark with thin film transparent dressing to preserve mark until surgery (now).    [facility] nursing team will follow, and will remain available to this patient, the nursing, surgical and medical teams in the event an ostomy is created intraopperatively.   Thanks, Maudie Flakes, MSN, RN, [place], Arther Abbott  Pager# [phone]

## 2017-12-01 NOTE — Anesthesia Postprocedure Evaluation (Signed)
Anesthesia Post Note  Patient: Charles Coffey  Procedure(s) Performed: COLONOSCOPY WITH PROPOFOL (N/A )     Patient location during evaluation: PACU Anesthesia Type: MAC Level of consciousness: awake and alert Pain management: pain level controlled Vital Signs Assessment: post-procedure vital signs reviewed and stable Respiratory status: spontaneous breathing, nonlabored ventilation, respiratory function stable and patient connected to nasal cannula oxygen Cardiovascular status: stable and blood pressure returned to baseline Postop Assessment: no apparent nausea or vomiting Anesthetic complications: no    Last Vitals:  Vitals:   12/01/17 0930 12/01/17 0940  BP: 120/68 135/86  Pulse: (!) 57 62  Resp: 17 11  Temp:    SpO2: 98% 96%    Last Pain:  Vitals:   12/01/17 0920  TempSrc: Oral  PainSc: 3                  Tiajuana Amass

## 2017-12-01 NOTE — Interval H&P Note (Signed)
History and Physical Interval Note:  12/01/2017 8:15 AM  Charles Coffey  has presented today for surgery, with the diagnosis of Abnomal rectal exam, Abnormal Ct rectum, constipation  The various methods of treatment have been discussed with the patient and family. After consideration of risks, benefits and other options for treatment, the patient has consented to  Procedure(s): COLONOSCOPY WITH PROPOFOL (N/A) as a surgical intervention .  The patient's history has been reviewed, patient examined, no change in status, stable for surgery.  I have reviewed the patient's chart and labs.  Questions were answered to the patient's satisfaction.     Milus Banister  He had a difficult time with the prep last night, really no stool output.  I don't think we'll be able to do full colonoscopy but I'm giving him fleet enema and plan at least a flex sig.  I'm increasingly concerned about rectal stricture, stenosis.

## 2017-12-01 NOTE — Op Note (Addendum)
[facility] Patient Name: Charles Coffey Procedure Date: 12/01/2017 MRN: [id number] Attending MD: Milus Banister , MD Date of Birth: 12/20/1935 CSN: [id number] Age: 82 Admit Type: Inpatient Procedure:                Colonoscopy attempt; only able to advance to the                            descending segment Indications:              Change in bowel habits Providers:                Milus Banister, MD, Laverta Baltimore RN, RN, Tinnie Gens, Technician, Christell Faith, CRNA Referring MD:              Medicines:                Monitored Anesthesia Care Complications:            No immediate complications. Estimated blood loss:                            None. Estimated Blood Loss:     Estimated blood loss: none. Procedure:                Pre-Anesthesia Assessment:                           - Prior to the procedure, a History and Physical                            was performed, and patient medications and                            allergies were reviewed. The patient's tolerance of                            previous anesthesia was also reviewed. The risks                            and benefits of the procedure and the sedation                            options and risks were discussed with the patient.                            All questions were answered, and informed consent                            was obtained. Prior Anticoagulants: The patient has                            taken no previous anticoagulant or antiplatelet  agents. ASA Grade Assessment: III - A patient with                            severe systemic disease. After reviewing the risks                            and benefits, the patient was deemed in                            satisfactory condition to undergo the procedure.                           After obtaining informed consent, the colonoscope                            was passed  under direct vision. Throughout the                            procedure, the patient's blood pressure, pulse, and                            oxygen saturations were monitored continuously. The                            Colonoscope was introduced through the anus and                            advanced to the the descending colon. The                            colonoscopy was performed without difficulty. The                            patient tolerated the procedure well. The quality                            of the bowel preparation was adequate. The rectum                            was photographed. Scope In: 8:51:01 AM Scope Out: 9:10:32 AM Total Procedure Duration: 0 hours 19 minutes 31 seconds  Findings:      The mid to distal rectum is edematous and there is moderate to severe       stenosis for 3-4 cm with proximal edge located 2-3cm from the anal       verge. The mucosa was thickened and firm, biopsied taken. This is not a       mucosal process, I favor extrinsic issue (probably invasion from       recently diagnosed, nearby bladder cancer).      There was a lot of liquid stool proximal to the stenosis and I suctioned       as much of this as I could (about 1 liter) to decompress. (This was not       a complete colonoscopy)      The exam was  otherwise without abnormality. Impression:               - Abnormal, edematous rectum with moderate to                            severe 3-4cm stenosis that is located 2-3cm from                            the anal verge. Biopsies were taken. This is not a                            mucosal process, I favor extrinsic issue; probably                            invasion from recently diagnosed, nearby bladder                            cancer. I discussed the case with Dr. [name] and he                            agrees. Moderate Sedation:      N/A- Per Anesthesia Care Recommendation:           - Return patient to hospital ward for  ongoing care.                           - Clear liquid diet.                           - Continue present medications.                           - We are consulting general surgery to consider                            diverting ostomy. Endoscopic stenting would have                            very low likelihood of helping given extensic                            nature of the stenosis and it is also too distal.                           - He has not been vomiting but had no stool output                            from the colonoscopy prep yesterday and so I think                            he will have eventual complete obstruction. Will                            need NG tube if he starts vomiting.                           -  Await final pathology. Procedure Code(s):        --- Professional ---                           [phone], 71, Colonoscopy, flexible; with biopsy,                            single or multiple Diagnosis Code(s):        --- Professional ---                           K62.4, Stenosis of anus and rectum                           R19.4, Change in bowel habit CPT copyright 2016 [employer]. All rights reserved. The codes documented in this report are preliminary and upon coder review may  be revised to meet current compliance requirements. Milus Banister, MD 12/01/2017 9:29:11 AM This report has been signed electronically. Number of Addenda: 0

## 2017-12-01 NOTE — Op Note (Signed)
PRE-OPERATIVE DIAGNOSIS: Rectal obstruction secondary to bladder cancer  POST-OPERATIVE DIAGNOSIS:  Same  PROCEDURE:  Procedure(s): Laparoscopic diverting colostomy  SURGEON:  Surgeon(s): Stark Klein, MD  Assistant: Will Creig Hines, PA-C  ANESTHESIA:   general and exparel  DRAINS: none   LOCAL MEDICATIONS USED:  Exparel  SPECIMEN:  Source of Specimen:  left retroperitoneal nodule  DISPOSITION OF SPECIMEN:  PATHOLOGY  COUNTS:  YES  DICTATION: .Dragon Dictation  PLAN OF CARE: back to room on floor  PATIENT DISPOSITION:  PACU - hemodynamically stable.  FINDINGS:  Dense adherence of proximal sigmoid colon to the retroperitoneum.  Possible tumor.  This was biopsied.  EBL: <50 mL  PROCEDURE:  Patient was identified in the holding area and taken to the operating room where he was placed supine on the operating room table.  General anesthesia was induced.  A Foley catheter was placed.  His abdomen was clipped, prepped, and draped in sterile fashion.  A timeout was performed according to the surgical safety checklist.  When all was correct, we continued.  The patient was placed into reverse Trendelenburg position and rotated to the left.  The right subcostal tissue was anesthetized and a 5 mm Optiview trocar was placed under direct visualization.  The abdomen was insufflated.  The patient did have rather dilated bowel and it was a bit difficult to see.  2 additional 5 mm trochars were placed along the right abdomen after administration of Exparel.  A 12 mm trocar was placed in the future ostomy site.  The sigmoid colon was adherent to the retroperitoneum at the proximal location.  This was taken down sharply along the white line of Toldt.  A portion of this dense tissue was biopsied and sent for permanent pathology.  The entire descending colon was mobilized but the sigmoid did not want to pull up to the ostomy site.  The distal transverse colon was mobilized and was able to easily  reach the ostomy site.  The epiploic appendages were clamped with a locking grasper.  The pneumoperitoneum was allowed to desufflate.  The 12 mm trocar site was enlarged to form the ostomy site.  The colon was pulled up through the ostomy site.  A small defect was made in the mesentery to pass the bridge underneath.  The ostomy site was reexamined laparoscopically and part of the omentum was taken down to eliminate tension on the ostomy.  The abdomen was desufflated again.  The trocar sites were closed using 4-0 Monocryl.  The ostomy was created with interrupted 4-0 vicryls.  This was done in Garrison fashion.  The ostomy appliance was then placed.  The port site incisions were dressed with Dermabond.  The patient was allowed to emerge from anesthesia and taken to PACU in stable condition.  Needle, sponge, and instrument counts were correct x2.

## 2017-12-01 NOTE — Anesthesia Procedure Notes (Signed)
Procedure Name: MAC Date/Time: 12/01/2017 8:43 AM Performed by: West Pugh, CRNA Pre-anesthesia Checklist: Patient identified, Emergency Drugs available, Suction available, Patient being monitored and Timeout performed Patient Re-evaluated:Patient Re-evaluated prior to induction Oxygen Delivery Method: Nasal cannula Induction Type: IV induction Placement Confirmation: positive ETCO2 Dental Injury: Teeth and Oropharynx as per pre-operative assessment

## 2017-12-01 NOTE — Consult Note (Signed)
[facility] Surgery Consult Note  NOAH LEMBKE August 20, 1935  [id number].    Requesting MD: Owens Loffler Chief Complaint/Reason for Consult: rectal stenosis  HPI:  CHRISOPHER PUSTEJOVSKY is an 82yo male PMH invasive urothelial carcinoma involving the left urethra and trigone, s/p cystoscopy and left retrograde pyelogram with TURBT on 11/25/17 by Dr. [name], who initially presented to the [facility] on 11/28/17 for fever, constipation and moderate bilateral hydronephrosis. He was hospitalized for urosepsis and urgently had a nephrostomy tube placed by Dr. Kathlene Cote 12/2. GI was consulted due to history of constipation and persistent lower cramping abdominal pain. He underwent flexible sigmoidoscopy earlier this morning and was found to have an abnormal and edematous rectum with moderate to severe 3-4cm stenosis located 2-3cm from the anal verge; biopsies were taken. Concern that is this probably secondary to invasion from nearby bladder cancer. Due to severe stenosis causing obstruction, general surgery has been consulted to discuss possibility of diverting ostomy. Patient reports issues with constipation for his entire life, but noticed a change over the last several weeks. He has seen Dr. Hilarie Fredrickson as an outpatient and tried Linzess as well as Amitiza. States that he has only been passing a small amount of flatus, last good BM was over 2 weeks ago. Some nausea and abdominal distension, no emesis. Continues to have lower, crampy abdominal pain. States that he has had rectal pain for several weeks as well, which he thought was due to his hemorrhoids. Since flex sig this morning, distension slightly improved.   PMH significant for Urothelial carcinoma, HTN, HLD, CKD-III, CAD, h/o CABG, ischemic cardiomyopathy Abdominal surgical history: none Former smoker Lives at home independently with wife, very active  ROS: Review of Systems  Constitutional: Positive for fever.  HENT: Negative.   Eyes: Negative.    Respiratory: Positive for shortness of breath.   Cardiovascular: Negative.   Gastrointestinal: Positive for abdominal pain and constipation.  Genitourinary: Negative.   Musculoskeletal: Negative.   Skin: Negative.   Neurological: Negative.    All systems reviewed and otherwise negative except for as above  Family History  Problem Relation Age of Onset  . Heart disease Mother   . Heart attack Mother   . Aneurysm Father        femoral artery  . Heart disease Brother   . Heart disease Maternal Uncle        x 2  . Colon cancer Neg Hx   . Esophageal cancer Neg Hx   . Pancreatic cancer Neg Hx   . Kidney disease Neg Hx   . Liver disease Neg Hx     Past Medical History:  Diagnosis Date  . Aortic atherosclerosis ([place])   . BPH with urinary obstruction   . CAD (coronary artery disease)    a.  MI 1995, CABG x 3 2002 (patient says that he had LIMA and RIMA grafts). b. ETT-Cardiolite (10/15) with EF 48%, apical scar, no ischemia. c. Nuc 07/2017 abnormal -> cath was performed,  patent LIMA to LAD and SVG to OM 2. RIMA to RCA is atretic. The right coronary artery is occluded distally with extensive collaterals from the LAD, medical therapy.   . Cervical spondylosis without myelopathy 2020-10-1816  . Chronic low back pain    sees Dr. Kary Kos   . GERD (gastroesophageal reflux disease)   . Hiatal hernia   . Hyperlipidemia   . Hypertension   . IBS (irritable bowel syndrome)   . Ischemic cardiomyopathy   . Myocardial infarction ([place])   .  RBBB   . Restless legs syndrome   . Statin intolerance   . Syncope    a. in 2015 - no apparent cause, was taking sleep medicine at the time. Cardiac workup unremarkable.  . Tubular adenoma of colon     Past Surgical History:  Procedure Laterality Date  . COLONOSCOPY  10/17/2014   per Dr. Hilarie Fredrickson, tubular adenomas, repeat in 3 yrs   . CYSTOSCOPY W/ RETROGRADES Left 11/25/2017   Procedure: CYSTOSCOPY WITH RETROGRADE PYELOGRAM;  Surgeon: Irine Seal,  MD;  Location: [facility];  Service: Urology;  Laterality: Left;  . HEART BYPASS    . IR NEPHROSTOMY PLACEMENT LEFT  11/28/2017  . LEFT HEART CATH AND CORS/GRAFTS ANGIOGRAPHY N/A 08/25/2017   Procedure: LEFT HEART CATH AND CORS/GRAFTS ANGIOGRAPHY;  Surgeon: Wellington Hampshire, MD;  Location: [facility];  Service: Cardiovascular;  Laterality: N/A;  . LUMBAR FUSION  2003   L3-L4  . PROSTATE SURGERY  06-27-12   per Dr. Roni Bread, had CTT  . TONSILLECTOMY    . TRANSURETHRAL RESECTION OF BLADDER TUMOR N/A 11/25/2017   Procedure: TRANSURETHRAL RESECTION OF BLADDER TUMOR (TURBT);  Surgeon: Irine Seal, MD;  Location: [facility];  Service: Urology;  Laterality: N/A;    Social History:  reports that he quit smoking about 38 years ago. His smoking use included cigarettes. he has never used smokeless tobacco. He reports that he drinks alcohol. He reports that he does not use drugs.  Allergies:  Allergies  Allergen Reactions  . Antihistamines, Loratadine-Type Other (See Comments)    Unable to urinate  . Statins Other (See Comments)    liver effects    Medications Prior to Admission  Medication Sig Dispense Refill  . acetaminophen (TYLENOL) 500 MG tablet Take 1,000 mg by mouth every 8 (eight) hours as needed for mild pain or moderate pain.    Marland Kitchen aspirin 81 MG tablet Take 81 mg by mouth daily.      . carvedilol (COREG) 6.25 MG tablet Take 1 tablet (6.25 mg total) 2 (two) times daily with a meal by mouth. 180 tablet 3  . diphenhydramine-acetaminophen (TYLENOL PM EXTRA STRENGTH) 25-500 MG TABS tablet Take 1 tablet by mouth at bedtime as needed.     . Hydrocortisone Acetate (HEMORRHOIDAL-HC RE) Place 1 application rectally as needed (hemorrhoids).    . isosorbide mononitrate (IMDUR) 60 MG 24 hr tablet Take 1 tablet (60 mg total) by mouth daily. 90 tablet 1  . lubiprostone (AMITIZA) 24 MCG capsule Take 1 capsule (24 mcg total) by mouth 2 (two) times daily with a meal. 60 capsule 3  . magnesium citrate SOLN Take  1 Bottle by mouth once.    . nitroGLYCERIN (NITROSTAT) 0.4 MG SL tablet Place 1 tablet (0.4 mg total) under the tongue every 5 (five) minutes as needed. (Patient taking differently: Place 0.4 mg under the tongue every 5 (five) minutes as needed for chest pain. ) 25 tablet 3  . Plant Sterols and Stanols (CHOLESTOFF PO) Take 2 tablets by mouth 2 (two) times daily.    . polyethylene glycol (MIRALAX / GLYCOLAX) packet Take 17 g by mouth daily as needed for moderate constipation.    . pramipexole (MIRAPEX) 1 MG tablet TAKE 1 AND 1/2 TABLETS BY MOUTH AT BEDTIME 45 tablet 11  . ranitidine (ZANTAC) 300 MG tablet TAKE 1 TABLET BY MOUTH TWICE A DAY 60 tablet 3  . Tetrahydroz-Glyc-Hyprom-PEG (VISINE MAXIMUM REDNESS RELIEF OP) Place 2 drops into both eyes 2 (two) times daily  as needed (dry eyes).     . traMADol (ULTRAM) 50 MG tablet Take 50 mg by mouth every 6 (six) hours as needed for moderate pain.     Marland Kitchen linaclotide (LINZESS) 290 MCG CAPS capsule TAKE 1 CAPSULE (290 MCG TOTAL) BY MOUTH DAILY. [place] (Patient not taking: Reported on 11/24/2017) 30 capsule 10    Prior to Admission medications   Medication Sig Start Date End Date Taking? Authorizing Provider  acetaminophen (TYLENOL) 500 MG tablet Take 1,000 mg by mouth every 8 (eight) hours as needed for mild pain or moderate pain.   Yes [provider]  aspirin 81 MG tablet Take 81 mg by mouth daily.     Yes [provider]  carvedilol (COREG) 6.25 MG tablet Take 1 tablet (6.25 mg total) 2 (two) times daily with a meal by mouth. 11/09/17  Yes Larey Dresser, MD  diphenhydramine-acetaminophen (TYLENOL PM EXTRA STRENGTH) 25-500 MG TABS tablet Take 1 tablet by mouth at bedtime as needed.    Yes [provider]  Hydrocortisone Acetate (HEMORRHOIDAL-HC RE) Place 1 application rectally as needed (hemorrhoids).   Yes [provider]  isosorbide mononitrate (IMDUR) 60 MG 24 hr tablet Take 1 tablet (60 mg total)  by mouth daily. 10/28/17 01/26/18 Yes End, Harrell Gave, MD  lubiprostone (AMITIZA) 24 MCG capsule Take 1 capsule (24 mcg total) by mouth 2 (two) times daily with a meal. 11/23/17  Yes Pyrtle, Lajuan Lines, MD  magnesium citrate SOLN Take 1 Bottle by mouth once.   Yes [provider]  nitroGLYCERIN (NITROSTAT) 0.4 MG SL tablet Place 1 tablet (0.4 mg total) under the tongue every 5 (five) minutes as needed. Patient taking differently: Place 0.4 mg under the tongue every 5 (five) minutes as needed for chest pain.  08/19/17 11/28/17 Yes Dunn, Dayna N, PA-C  Plant Sterols and Stanols (CHOLESTOFF PO) Take 2 tablets by mouth 2 (two) times daily.   Yes [provider]  polyethylene glycol (MIRALAX / GLYCOLAX) packet Take 17 g by mouth daily as needed for moderate constipation.   Yes [provider]  pramipexole (MIRAPEX) 1 MG tablet TAKE 1 AND 1/2 TABLETS BY MOUTH AT BEDTIME 06/29/17  Yes [name], MD  ranitidine (ZANTAC) 300 MG tablet TAKE 1 TABLET BY MOUTH TWICE A DAY 10/04/17  Yes Pyrtle, Lajuan Lines, MD  Tetrahydroz-Glyc-Hyprom-PEG (VISINE MAXIMUM REDNESS RELIEF OP) Place 2 drops into both eyes 2 (two) times daily as needed (dry eyes).    Yes [provider]  traMADol (ULTRAM) 50 MG tablet Take 50 mg by mouth every 6 (six) hours as needed for moderate pain.    Yes [provider]  linaclotide (LINZESS) 290 MCG CAPS capsule TAKE 1 CAPSULE (290 MCG TOTAL) BY MOUTH DAILY. [place] Patient not taking: Reported on 11/24/2017 03/30/17   Pyrtle, Lajuan Lines, MD    Blood pressure 135/86, pulse 62, temperature 97.8 F (36.6 C), temperature source Oral, resp. rate 11, height 5' 8.5" (1.74 m), weight 194 lb (88 kg), SpO2 100 %. Physical Exam: General: pleasant, WD/WN white male who is laying in bed in NAD HEENT: head is normocephalic, atraumatic.  Sclera are noninjected.  Pupils equal and round.  Ears and nose without any masses or lesions.  Mouth is pink and moist.  Dentition fair Heart: regular, rate, and rhythm.  No obvious murmurs, gallops, or rubs noted.  Palpable pedal pulses bilaterally Lungs: CTAB, no wheezes, rhonchi, or rales  noted.  Respiratory effort nonlabored Abd: soft, mild distension, mild TTP periumbilical region without rebound or guarding, hypoactive BS, no masses, hernias, or organomegaly MS: all 4 extremities are symmetrical with no cyanosis, clubbing, or edema. Skin: warm and dry with no masses, lesions, or rashes Psych: A&Ox3 with an appropriate affect. Neuro: cranial nerves grossly intact, extremity CSM intact bilaterally, normal speech  Results for orders placed or performed during the hospital encounter of 11/28/17 (from the past 48 hour(s))  CBC     Status: Abnormal   Collection Time: 11/30/17  5:55 AM  Result Value Ref Range   WBC 6.8 4.0 - 10.5 K/uL   RBC 3.00 (L) 4.22 - 5.81 MIL/uL   Hemoglobin 9.2 (L) 13.0 - 17.0 g/dL   HCT 27.1 (L) 39.0 - 52.0 %   MCV 90.3 78.0 - 100.0 fL   MCH 30.7 26.0 - 34.0 pg   MCHC 33.9 30.0 - 36.0 g/dL   RDW 16.1 (H) 11.5 - 15.5 %   Platelets 60 (L) 150 - 400 K/uL    Comment: CONSISTENT WITH PREVIOUS RESULT  Basic metabolic panel     Status: Abnormal   Collection Time: 11/30/17  5:55 AM  Result Value Ref Range   Sodium 132 (L) 135 - 145 mmol/L   Potassium 4.3 3.5 - 5.1 mmol/L   Chloride 103 101 - 111 mmol/L   CO2 22 22 - 32 mmol/L   Glucose, Bld 114 (H) 65 - 99 mg/dL   BUN 24 (H) 6 - 20 mg/dL   Creatinine, Ser 1.49 (H) 0.61 - 1.24 mg/dL   Calcium 9.0 8.9 - 10.3 mg/dL   GFR calc non Af Amer 43 (L) >60 mL/min   GFR calc Af Amer 49 (L) >60 mL/min    Comment: (NOTE) The eGFR has been calculated using the CKD EPI equation. This calculation has not been validated in all clinical situations. eGFR's persistently <60 mL/min signify possible Chronic Kidney Disease.    Anion gap 7 5 - 15  Glucose, capillary     Status: Abnormal   Collection Time: 11/30/17  7:51 AM  Result Value Ref Range    Glucose-Capillary 107 (H) 65 - 99 mg/dL  CBC with Differential/Platelet     Status: Abnormal   Collection Time: 12/01/17  6:45 AM  Result Value Ref Range   WBC 6.3 4.0 - 10.5 K/uL   RBC 2.96 (L) 4.22 - 5.81 MIL/uL   Hemoglobin 9.1 (L) 13.0 - 17.0 g/dL   HCT 26.7 (L) 39.0 - 52.0 %   MCV 90.2 78.0 - 100.0 fL   MCH 30.7 26.0 - 34.0 pg   MCHC 34.1 30.0 - 36.0 g/dL   RDW 16.3 (H) 11.5 - 15.5 %   Platelets 61 (L) 150 - 400 K/uL    Comment: CONSISTENT WITH PREVIOUS RESULT   Neutrophils Relative % 60 %   Neutro Abs 3.8 1.7 - 7.7 K/uL   Lymphocytes Relative 26 %   Lymphs Abs 1.6 0.7 - 4.0 K/uL   Monocytes Relative 12 %   Monocytes Absolute 0.8 0.1 - 1.0 K/uL   Eosinophils Relative 2 %   Eosinophils Absolute 0.1 0.0 - 0.7 K/uL   Basophils Relative 0 %   Basophils Absolute 0.0 0.0 - 0.1 K/uL  Basic metabolic panel     Status: Abnormal   Collection Time: 12/01/17  6:45 AM  Result Value Ref Range   Sodium 136 135 - 145 mmol/L   Potassium 3.7 3.5 - 5.1 mmol/L  Chloride 107 101 - 111 mmol/L   CO2 21 (L) 22 - 32 mmol/L   Glucose, Bld 102 (H) 65 - 99 mg/dL   BUN 22 (H) 6 - 20 mg/dL   Creatinine, Ser 1.56 (H) 0.61 - 1.24 mg/dL   Calcium 8.7 (L) 8.9 - 10.3 mg/dL   GFR calc non Af Amer 40 (L) >60 mL/min   GFR calc Af Amer 47 (L) >60 mL/min    Comment: (NOTE) The eGFR has been calculated using the CKD EPI equation. This calculation has not been validated in all clinical situations. eGFR's persistently <60 mL/min signify possible Chronic Kidney Disease.    Anion gap 8 5 - 15  Glucose, capillary     Status: Abnormal   Collection Time: 12/01/17  7:41 AM  Result Value Ref Range   Glucose-Capillary 103 (H) 65 - 99 mg/dL   Dg Abd 2 Views  Result Date: 11/30/2017 CLINICAL DATA:  Abdominal bloating, rectal pain with bowel movements. EXAM: ABDOMEN - 2 VIEW COMPARISON:  Abdominal radiographs of November 29, 2017 FINDINGS: There remain multiple air in fluid levels within loops of colon in the mid  and upper abdomen bilaterally. There are few small air-fluid levels in the mid abdomen. No significant rectal gas is observed. No free extraluminal gas collections are demonstrated. A left nephrostomy tube appears stable. There are postsurgical changes in the lower lumbar spine. There is stable moderate dextrocurvature centered at L2-3. IMPRESSION: There has been distal migration of small bowel gas into the colon. No evidence of obstruction is evident. There is no significant rectal gas however. There is no evidence of perforation. Electronically Signed   By: David  Martinique M.D.   On: 11/30/2017 14:49    Anti-infectives (From admission, onward)   Start     Dose/Rate Route Frequency Ordered Stop   11/29/17 0000  cefTRIAXone (ROCEPHIN) 2 g in dextrose 5 % 50 mL IVPB     2 g 100 mL/hr over 30 Minutes Intravenous Every 24 hours 11/28/17 1921     11/29/17 0000  vancomycin (VANCOCIN) IVPB 1000 mg/200 mL premix  Status:  Discontinued     1,000 mg 200 mL/hr over 60 Minutes Intravenous Every 24 hours 11/28/17 1934 11/30/17 1229   11/28/17 1745  cefTRIAXone (ROCEPHIN) 2 g in dextrose 5 % 50 mL IVPB  Status:  Discontinued     2 g 100 mL/hr over 30 Minutes Intravenous  Once 11/28/17 1734 11/28/17 1735   11/28/17 1745  piperacillin-tazobactam (ZOSYN) IVPB 3.375 g     3.375 g 100 mL/hr over 30 Minutes Intravenous  Once 11/28/17 1734 11/28/17 1902   11/28/17 1745  vancomycin (VANCOCIN) IVPB 1000 mg/200 mL premix     1,000 mg 200 mL/hr over 60 Minutes Intravenous  Once 11/28/17 1734 11/28/17 2044       Assessment/Plan Urothelial carcinoma involving the left urethra and trigone, s/p cystoscopy and left retrograde pyelogram with TURBT on 11/25/17 by Dr. [name] HTN HLD CKD-III CAD - h/o CABG. cardiology Dr. Saunders Revel. ECHO 08/2017 EF 50% Ischemic cardiomyopathy  Rectal stenosis - Patient with h/o urothelial carcinoma involving the left urethra and trigone who underwent flexible sigmoidoscopy earlier this  morning and was found to have an abnormal and edematous rectum with moderate to severe 3-4cm stenosis located 2-3cm from the anal verge; biopsies were taken. Concern that is this probably secondary to invasion from nearby bladder cancer. Patient is completely obstructed from the stenosis, therefore agreeable to undergo laparoscopic diverting ostomy today with Dr. Barry Dienes.  ID - rocephin 12/2>>, vancomycin 12/2>> VTE - SCDs FEN - IVF, NPO  Wellington Hampshire, [facility] Surgery 12/01/2017, 10:40 AM Pager: [phone] Consults: [phone] Mon-Fri 7:00 am-4:30 pm Sat-Sun 7:00 am-11:30 am

## 2017-12-01 NOTE — Transfer of Care (Signed)
Immediate Anesthesia Transfer of Care Note  Patient: Charles Coffey  Procedure(s) Performed: COLONOSCOPY WITH PROPOFOL (N/A )  Patient Location: PACU  Anesthesia Type:MAC  Level of Consciousness: awake, alert , oriented and patient cooperative  Airway & Oxygen Therapy: Patient Spontanous Breathing  Post-op Assessment: Report given to RN, Post -op Vital signs reviewed and stable and Patient moving all extremities X 4  Post vital signs: Reviewed and stable  Last Vitals:  Vitals:   12/01/17 0544 12/01/17 0819  BP: (!) 103/57 (!) 154/82  Pulse: 63   Resp: 16 16  Temp: 36.7 C 36.7 C  SpO2: 93% 98%    Last Pain:  Vitals:   12/01/17 0843  TempSrc:   PainSc: 6          Complications: No apparent anesthesia complications

## 2017-12-01 NOTE — Transfer of Care (Signed)
Immediate Anesthesia Transfer of Care Note  Patient: Charles Coffey  Procedure(s) Performed: LAPAROSCOPIC DIVERTING OSTOMY (N/A Abdomen)  Patient Location: PACU  Anesthesia Type:General  Level of Consciousness: awake, alert  and oriented  Airway & Oxygen Therapy: Patient Spontanous Breathing and Patient connected to face mask oxygen  Post-op Assessment: Report given to RN and Post -op Vital signs reviewed and stable  Post vital signs: Reviewed and stable  Last Vitals:  Vitals:   12/01/17 0940 12/01/17 0945  BP: 135/86   Pulse: 62   Resp: 11   Temp:    SpO2: 96% 100%    Last Pain:  Vitals:   12/01/17 0920  TempSrc: Oral  PainSc: 3          Complications: No apparent anesthesia complications

## 2017-12-02 DIAGNOSIS — K56699 Other intestinal obstruction unspecified as to partial versus complete obstruction: Secondary | ICD-10-CM

## 2017-12-02 LAB — COMPREHENSIVE METABOLIC PANEL
ALT: 18 U/L (ref 17–63)
AST: 32 U/L (ref 15–41)
Albumin: 3.7 g/dL (ref 3.5–5.0)
Alkaline Phosphatase: 50 U/L (ref 38–126)
Anion gap: 11 (ref 5–15)
BUN: 25 mg/dL — ABNORMAL HIGH (ref 6–20)
CO2: 21 mmol/L — ABNORMAL LOW (ref 22–32)
Calcium: 9.1 mg/dL (ref 8.9–10.3)
Chloride: 107 mmol/L (ref 101–111)
Creatinine, Ser: 1.74 mg/dL — ABNORMAL HIGH (ref 0.61–1.24)
GFR calc Af Amer: 41 mL/min — ABNORMAL LOW (ref >60)
GFR calc non Af Amer: 35 mL/min — ABNORMAL LOW (ref >60)
Glucose, Bld: 141 mg/dL — ABNORMAL HIGH (ref 65–99)
Potassium: 4.7 mmol/L (ref 3.5–5.1)
Sodium: 139 mmol/L (ref 135–145)
Total Bilirubin: 0.8 mg/dL (ref 0.3–1.2)
Total Protein: 6.7 g/dL (ref 6.5–8.1)

## 2017-12-02 LAB — CBC
HCT: 27.4 % — ABNORMAL LOW (ref 39.0–52.0)
Hemoglobin: 9.3 g/dL — ABNORMAL LOW (ref 13.0–17.0)
MCH: 31 pg (ref 26.0–34.0)
MCHC: 33.9 g/dL (ref 30.0–36.0)
MCV: 91.3 fL (ref 78.0–100.0)
Platelets: 86 10*3/uL — ABNORMAL LOW (ref 150–400)
RBC: 3 MIL/uL — ABNORMAL LOW (ref 4.22–5.81)
RDW: 16.1 % — ABNORMAL HIGH (ref 11.5–15.5)
WBC: 8.5 10*3/uL (ref 4.0–10.5)

## 2017-12-02 LAB — GLUCOSE, CAPILLARY: Glucose-Capillary: 122 mg/dL — ABNORMAL HIGH (ref 65–99)

## 2017-12-02 MED ORDER — DEXTROSE 5 % IV SOLN
1.0000 g | INTRAVENOUS | Status: DC
  Administered 2017-12-02 – 2017-12-03 (×2): 1 g via INTRAVENOUS
  Filled 2017-12-02 (×3): qty 10

## 2017-12-02 MED ORDER — ACETAMINOPHEN 500 MG PO TABS
1000.0000 mg | ORAL_TABLET | Freq: Once | ORAL | Status: AC
  Administered 2017-12-02: 1000 mg via ORAL
  Filled 2017-12-02: qty 2

## 2017-12-02 MED ORDER — VANCOMYCIN HCL IN DEXTROSE 1-5 GM/200ML-% IV SOLN: 1000.0000 mg | INTRAVENOUS | Status: AC

## 2017-12-02 MED ORDER — SODIUM CHLORIDE 0.9 % IV SOLN
INTRAVENOUS | Status: DC
  Administered 2017-12-02 – 2017-12-03 (×2): via INTRAVENOUS

## 2017-12-02 NOTE — Progress Notes (Signed)
Referring Physician(s): Keene Breath  Supervising Physician: Jacqulynn Cadet  Patient Status:  [facility] - In-pt  Chief Complaint:  Metastatic bladder cancer  Subjective: Pt in good spirits; denies sig abd pain, N/V; sitting up in chair; s/p diverting colostomy yesterday secondary rectal stenosis /tumor involvement   Allergies: Antihistamines, loratadine-type and Statins  Medications: Prior to Admission medications   Medication Sig Start Date End Date Taking? Authorizing Provider  acetaminophen (TYLENOL) 500 MG tablet Take 1,000 mg by mouth every 8 (eight) hours as needed for mild pain or moderate pain.   Yes [provider]  aspirin 81 MG tablet Take 81 mg by mouth daily.     Yes [provider]  carvedilol (COREG) 6.25 MG tablet Take 1 tablet (6.25 mg total) 2 (two) times daily with a meal by mouth. 11/09/17  Yes Larey Dresser, MD  diphenhydramine-acetaminophen (TYLENOL PM EXTRA STRENGTH) 25-500 MG TABS tablet Take 1 tablet by mouth at bedtime as needed.    Yes [provider]  Hydrocortisone Acetate (HEMORRHOIDAL-HC RE) Place 1 application rectally as needed (hemorrhoids).   Yes [provider]  isosorbide mononitrate (IMDUR) 60 MG 24 hr tablet Take 1 tablet (60 mg total) by mouth daily. 10/28/17 01/26/18 Yes End, Harrell Gave, MD  lubiprostone (AMITIZA) 24 MCG capsule Take 1 capsule (24 mcg total) by mouth 2 (two) times daily with a meal. 11/23/17  Yes Pyrtle, Lajuan Lines, MD  magnesium citrate SOLN Take 1 Bottle by mouth once.   Yes [provider]  nitroGLYCERIN (NITROSTAT) 0.4 MG SL tablet Place 1 tablet (0.4 mg total) under the tongue every 5 (five) minutes as needed. Patient taking differently: Place 0.4 mg under the tongue every 5 (five) minutes as needed for chest pain.  08/19/17 11/28/17 Yes Dunn, Dayna N, PA-C  Plant Sterols and Stanols (CHOLESTOFF PO) Take 2 tablets by mouth 2 (two) times daily.   Yes [provider]    polyethylene glycol (MIRALAX / GLYCOLAX) packet Take 17 g by mouth daily as needed for moderate constipation.   Yes [provider]  pramipexole (MIRAPEX) 1 MG tablet TAKE 1 AND 1/2 TABLETS BY MOUTH AT BEDTIME 06/29/17  Yes [name], MD  ranitidine (ZANTAC) 300 MG tablet TAKE 1 TABLET BY MOUTH TWICE A DAY 10/04/17  Yes Pyrtle, Lajuan Lines, MD  Tetrahydroz-Glyc-Hyprom-PEG (VISINE MAXIMUM REDNESS RELIEF OP) Place 2 drops into both eyes 2 (two) times daily as needed (dry eyes).    Yes [provider]  traMADol (ULTRAM) 50 MG tablet Take 50 mg by mouth every 6 (six) hours as needed for moderate pain.    Yes [provider]  linaclotide (LINZESS) 290 MCG CAPS capsule TAKE 1 CAPSULE (290 MCG TOTAL) BY MOUTH DAILY. [place] A MEAL Patient not taking: Reported on 11/24/2017 03/30/17   Jerene Bears, MD     Vital Signs: BP 121/76 (BP Location: Left Arm)   Pulse 76   Temp 97.7 F (36.5 C) (Oral)   Resp 20   Ht 5' 8.5" (1.74 m)   Wt 203 lb 4.2 oz (92.2 kg)   SpO2 98%   BMI 30.46 kg/m   Physical Exam awake/alert; left PCN intact, output 80 cc blood-tinged urine; cx- neg; PCN flushes without difficulty/leaking  Imaging: Ct Abdomen Pelvis Wo Contrast  Result Date: 11/28/2017 CLINICAL DATA:  Family reports pt had renal and bladder biopsies on Thursday or Friday. Family reports pt has had a fever at home, nausea, dysuria, and AMS.  Denies any antipyretic use today. [name]. EXAM: CT ABDOMEN AND PELVIS WITHOUT CONTRAST TECHNIQUE: Multidetector CT imaging of the abdomen and pelvis was performed following the standard protocol without IV contrast. COMPARISON:  11/11/2017 FINDINGS: Lower chest: Small right and minimal left pleural effusions. Pleural effusions are new since the prior CT. Mild interstitial thickening at the lung bases as well as subsegmental atelectasis, greater on the right. Interstitial thickening is similar to the prior CT.  No convincing pneumonia. Hepatobiliary: No focal liver abnormality is seen. No gallstones, gallbladder wall thickening, or biliary dilatation. Pancreas: Unremarkable. No pancreatic ductal dilatation or surrounding inflammatory changes. Spleen: Normal in size without focal abnormality. Adrenals/Urinary Tract: Residual contrast fills the moderately dilated left intrarenal collecting system and portions of the left ureter. There is mild dilation of the right intrarenal collecting system and portions of the right ureter. There is diffuse left renal cortical thinning. No renal masses. No visualized stones. No adrenal masses. Irregular thickening along the posterior aspect of the bladder, greater on the left, is similar to the prior CT. Stomach/Bowel: Stomach is unremarkable. The small bowel and colon show air-fluid levels, but are nondilated with no wall thickening or adjacent inflammation. This is consistent with a mild diffuse adynamic ileus. No evidence of obstruction. Mild colonic stool burden. Appendix not visualized. Vascular/Lymphatic: Aortic atherosclerosis. No enlarged abdominal or pelvic lymph nodes. Reproductive: Prostate gland is enlarged this, bulging against the posterior inferior bladder base, stable from the recent CT. Other: No abdominal wall hernia or abnormality. No abdominopelvic ascites. Musculoskeletal: No fracture or acute finding. No osteoblastic or osteolytic lesions. Stable changes from a previous L3-L4 posterior lumbar spine fusion. Chronic bilateral pars defects with a grade 1 anterolisthesis at L5-S1. IMPRESSION: 1. Moderate left hydronephrosis. There is residual contrast filling of the left intrarenal collecting system and portions of the left ureter. This is consistent with moderate obstructive uropathy, which appears due to an area of irregular bladder wall thickening or mass crossing the left ureteral trigone. 2. Mild right hydronephrosis. Right hydronephrosis has increased from the  previous CT scan. Left hydronephrosis is unchanged. There is left perinephric and peri ureteral stranding, which is similar to the prior exam which may be the result of the obstruction only, but infection should also be considered. 3. Small right and minimal left pleural effusions. Mild lung base interstitial thickening. Consider mild congestive heart failure given the symptoms of shortness of breath. Electronically Signed   By: Lajean Manes M.D.   On: 11/28/2017 16:18   Dg Chest Port 1 View  Result Date: 11/28/2017 CLINICAL DATA:  Per order- SOBHis chronic constipation has worsened over the past several days. He has also felt very poorly, unable to get out of bed today and low-grade fevers. Pt states Sob today. EXAM: PORTABLE CHEST 1 VIEW COMPARISON:  None. FINDINGS: There changes from prior cardiac surgery. The cardiac silhouette is normal in size and configuration. No mediastinal or hilar masses. No convincing adenopathy. Clear lungs.  No pleural effusion or pneumothorax. Skeletal structures are intact. IMPRESSION: No active disease. Electronically Signed   By: Lajean Manes M.D.   On: 11/28/2017 15:59   Dg Abd 2 Views  Result Date: 11/30/2017 CLINICAL DATA:  Abdominal bloating, rectal pain with bowel movements. EXAM: ABDOMEN - 2 VIEW COMPARISON:  Abdominal radiographs of November 29, 2017 FINDINGS: There remain multiple air in fluid levels within loops of colon in the mid and upper abdomen bilaterally. There are few small air-fluid levels in  the mid abdomen. No significant rectal gas is observed. No free extraluminal gas collections are demonstrated. A left nephrostomy tube appears stable. There are postsurgical changes in the lower lumbar spine. There is stable moderate dextrocurvature centered at L2-3. IMPRESSION: There has been distal migration of small bowel gas into the colon. No evidence of obstruction is evident. There is no significant rectal gas however. There is no evidence of perforation.  Electronically Signed   By: David  Martinique M.D.   On: 11/30/2017 14:49   Acute Abdominal Series  Result Date: 11/29/2017 CLINICAL DATA:  Initial evaluation for constipation for 1 week. EXAM: DG ABDOMEN ACUTE W/ 1V CHEST COMPARISON:  Prior CT from 11/28/2017. FINDINGS: Median sternotomy wires underlying cardiomegaly. Mediastinal silhouette normal. Lungs hypoinflated. Mild diffuse pulmonary interstitial congestion without frank alveolar edema. No other focal infiltrates. No pleural effusion. No pneumothorax. Few scattered gas-filled loops of bowel seen throughout the abdomen without evidence for obstruction or ileus. No free air seen on lateral decubitus view. No significant bowel wall thickening. Overall stool burden is relatively mild. Soft tissue mass or abnormal calcification. Left-sided nephrostomy tube noted. Dextroscoliosis with postsurgical changes noted within the lower lumbar spine. IMPRESSION: 1. Nonobstructive bowel gas pattern with no radiographic evidence for acute intra-abdominal pathology. Overall stool burden is mild. 2. Percutaneous nephrostomy tube in place. 3. Stable cardiomegaly with mild diffuse pulmonary interstitial congestion without frank pulmonary edema. Electronically Signed   By: Jeannine Boga M.D.   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 Status post retrograde urography on 11/25/2017. At that time, contrast injection was performed but a ureteral stent could not be advanced to the level of the kidney. He now presents with increasing flank and back pain as well as fever with evidence of developing urosepsis. By CT, there is retained contrast material in the left renal collecting system that demonstrates significant hydronephrosis. EXAM: LEFT PERCUTANEOUS NEPHROSTOMY TUBE PLACEMENT.  COMPARISON:  CT of the abdomen on 11/28/2017 and imaging from retrograde urography study on 11/25/2017 ANESTHESIA/SEDATION: 1.0 mg IV Versed; 50 mcg IV Fentanyl. Total Moderate Sedation Time 6 minutes. The patient's level of consciousness and physiologic status were continuously monitored during the procedure by Radiology nursing. CONTRAST:  15 mL Isovue-300 injected into the renal collecting system. MEDICATIONS: IV vancomycin was being infused at the time of the procedure. FLUOROSCOPY TIME:  1 minute and 42 seconds.  72 mGy. PROCEDURE: The procedure, risks, benefits, and alternatives were explained to the patient. Questions regarding the procedure were encouraged and answered. The patient understands and consents to the procedure. A time-out was performed prior to initiating the procedure. The left flank region was prepped with chlorhexidine in a sterile fashion, and a sterile drape was applied covering the operative field. A sterile gown and sterile gloves were used for the procedure. Local anesthesia was provided with 1% Lidocaine. Under fluoroscopy, a 21 gauge needle was advanced into the renal collecting system via a posterior lower pole calyx. Aspiration of urine sample was performed followed by contrast injection. A guidewire was advanced. A transitional dilator was advanced over the guidewire. Percutaneous tract dilatation was then performed over the guidewire. A 10 -French percutaneous nephrostomy tube was then advanced and formed in the collecting system. Catheter position was confirmed by fluoroscopy after contrast injection. The catheter was secured at the skin with a Prolene retention suture and Stat-Lock  device. A urine sample was sent for culture analysis. A gravity bag was placed. COMPLICATIONS: None. FINDINGS: Severe hydronephrosis is present with retained contrast material in the left renal collecting system. After access of the lower pole, a nephrostomy tube was eventually placed and advanced into  the renal pelvis. IMPRESSION: Placement of 10 French left percutaneous nephrostomy tube. A urine sample was sent for culture analysis. Electronically Signed   By: Aletta Edouard M.D.   On: 11/30/2017 09:20    Labs:  CBC: Recent Labs    11/29/17 0540 11/30/17 0555 12/01/17 0645 12/02/17 0611  WBC 6.8 6.8 6.3 8.5  HGB 8.1* 9.2* 9.1* 9.3*  HCT 24.3* 27.1* 26.7* 27.4*  PLT 59* 60* 61* 86*    COAGS: Recent Labs    08/24/17 0948  INR 1.0    BMP: Recent Labs    11/29/17 0540 11/30/17 0555 12/01/17 0645 12/02/17 0611  NA 134* 132* 136 139  K 4.4 4.3 3.7 4.7  CL 105 103 107 107  CO2 21* 22 21* 21*  GLUCOSE 98 114* 102* 141*  BUN 26* 24* 22* 25*  CALCIUM 8.5* 9.0 8.7* 9.1  CREATININE 1.55* 1.49* 1.56* 1.74*  GFRNONAA 41* 43* 40* 35*  GFRAA 47* 49* 47* 41*    LIVER FUNCTION TESTS: Recent Labs    09/15/17 1420 11/28/17 1518 12/02/17 0611  BILITOT  --  1.1 0.8  AST  --  23 32  ALT  --  18 18  ALKPHOS  --  51 50  PROT 6.2 6.7 6.7  ALBUMIN  --  4.0 3.7    Assessment and Plan: Pt with hx urothelial carcinomawith bilateral left greater than right hydronephrosis/malignant obstruction, recent fever/flank pain; s/pleft percutaneous nephrostomy on 12/2; PCN urine/blood cx neg; s/p diverting colostomy 12/5 secondary to rectal stenosis/tumor involvement, prob urothelial origin; afebrile, creatinine 1.74 (1.56), WBC normal, hemoglobin 9.3, platelets 86k; continue current treatment, await urology follow-up as patient may require a right nephrostomy as well     Electronically Signed: D. Rowe Robert, PA-C 12/02/2017, 11:28 AM   I spent a total of 15 minutes at the the patient's bedside AND on the patient's hospital floor or unit, greater than 50% of which was counseling/coordinating care for left nephrostomy    Patient ID: Charles Coffey, male   DOB: 14-Dec-1935, 82 y.o.   MRN: [id number]

## 2017-12-02 NOTE — Progress Notes (Signed)
Pt continues to be angry with me due to the bed alarm being set on his bed and when he moves to get to the side of the bed the bed alarm goes off.  I again explained to pt that it is for his safety and that he is to call when getting up so staff can assist him.

## 2017-12-02 NOTE — Progress Notes (Signed)
Date: December 02, 2017 Rhonda Davis, BSN, RN3, CCM [phone] Chart and notes review for patient progress and needs. Will follow for case management and discharge needs. Next review date: [id number] 

## 2017-12-02 NOTE — Progress Notes (Signed)
1 Day Post-Op  Subjective: Mr.  Coffey continues to do well post colostomy.   His Cr continues to rise and is up to 1.74.  He had positive biopsies from the rectal wall at colonoscopy.  Retroperitoneal biopsies are pending.    He has T4 urothelial carcinoma of the bladder and left ureter with bilateral hydro.    ROS:  Review of Systems  Constitutional: Negative for fever.  Gastrointestinal: Negative for nausea and vomiting.    Anti-infectives: Anti-infectives (From admission, onward)   Start     Dose/Rate Route Frequency Ordered Stop   11/29/17 0000  cefTRIAXone (ROCEPHIN) 2 g in dextrose 5 % 50 mL IVPB  Status:  Discontinued     2 g 100 mL/hr over 30 Minutes Intravenous Every 24 hours 11/28/17 1921 12/01/17 1631   11/29/17 0000  vancomycin (VANCOCIN) IVPB 1000 mg/200 mL premix  Status:  Discontinued     1,000 mg 200 mL/hr over 60 Minutes Intravenous Every 24 hours 11/28/17 1934 11/30/17 1229   11/28/17 1745  cefTRIAXone (ROCEPHIN) 2 g in dextrose 5 % 50 mL IVPB  Status:  Discontinued     2 g 100 mL/hr over 30 Minutes Intravenous  Once 11/28/17 1734 11/28/17 1735   11/28/17 1745  piperacillin-tazobactam (ZOSYN) IVPB 3.375 g     3.375 g 100 mL/hr over 30 Minutes Intravenous  Once 11/28/17 1734 11/28/17 1902   11/28/17 1745  vancomycin (VANCOCIN) IVPB 1000 mg/200 mL premix     1,000 mg 200 mL/hr over 60 Minutes Intravenous  Once 11/28/17 1734 11/28/17 2044      Current Facility-Administered Medications  Medication Dose Route Frequency Provider Last Rate Last Dose  . acetaminophen (TYLENOL) tablet 1,000 mg  1,000 mg Oral Once Thomes Lolling, RPH      . albuterol (PROVENTIL) (2.5 MG/3ML) 0.083% nebulizer solution 2.5 mg  2.5 mg Nebulization Q2H PRN Eugenie Filler, MD      . bisacodyl (DULCOLAX) suppository 10 mg  10 mg Rectal Daily PRN Eugenie Filler, MD      . carvedilol (COREG) tablet 6.25 mg  6.25 mg Oral BID WC Eugenie Filler, MD   6.25 mg at 12/01/17 1023  .  famotidine (PEPCID) IVPB 20 mg premix  20 mg Intravenous Q12H Earnstine Regal, PA-C   Stopped at 12/02/17 1202  . hydrocortisone (ANUSOL-HC) suppository 25 mg  25 mg Rectal PRN Eugenie Filler, MD      . isosorbide mononitrate (IMDUR) 24 hr tablet 60 mg  60 mg Oral Daily Eugenie Filler, MD   60 mg at 12/02/17 1103  . [START ON 12/04/2017] lubiprostone (AMITIZA) capsule 24 mcg  24 mcg Oral BID WC Earnstine Regal, PA-C      . morphine 4 MG/ML injection 1-3 mg  1-3 mg Intravenous Q2H PRN Earnstine Regal, PA-C   3 mg at 12/01/17 2042  . nitroGLYCERIN (NITROSTAT) SL tablet 0.4 mg  0.4 mg Sublingual Q5 min PRN Eugenie Filler, MD   0.4 mg at 12/01/17 0035  . ondansetron (ZOFRAN) tablet 4 mg  4 mg Oral Q6H PRN Eugenie Filler, MD       Or  . ondansetron [facility]) injection 4 mg  4 mg Intravenous Q6H PRN Eugenie Filler, MD      . oxyCODONE (Oxy IR/ROXICODONE) immediate release tablet 5-10 mg  5-10 mg Oral Q4H PRN Elodia Florence., MD      . pramipexole (MIRAPEX) tablet 0.25 mg  0.25 mg Oral  TID Elodia Florence., MD   0.25 mg at 12/01/17 0030  . sodium chloride flush (NS) 0.9 % injection 5 mL  5 mL Intravenous Q8H Aletta Edouard, MD   5 mL at 12/02/17 1123     Objective: Vital signs in last 24 hours: Temp:  [97.7 F (36.5 C)-98.3 F (36.8 C)] 97.7 F (36.5 C) (12/06 0525) Pulse Rate:  [64-76] 76 (12/06 0525) Resp:  [14-20] 20 (12/06 0525) BP: (100-138)/(56-85) 121/76 (12/06 0525) SpO2:  [97 %-100 %] 98 % (12/06 0800) Weight:  [92.2 kg (203 lb 4.2 oz)] 92.2 kg (203 lb 4.2 oz) (12/06 0525)  Intake/Output from previous day: 12/05 0701 - 12/06 0700 In: 1550 [I.V.:1250; IV Piggyback:300] Out: 1027 [Urine:1130; Stool:350] Intake/Output this shift: No intake/output data recorded.   Physical Exam  Constitutional: He is well-developed, well-nourished, and in no distress.  Vitals reviewed.   Lab Results:  Recent Labs    12/01/17 0645 12/02/17 0611  WBC 6.3  8.5  HGB 9.1* 9.3*  HCT 26.7* 27.4*  PLT 61* 86*   BMET Recent Labs    12/01/17 0645 12/02/17 0611  NA 136 139  K 3.7 4.7  CL 107 107  CO2 21* 21*  GLUCOSE 102* 141*  BUN 22* 25*  CREATININE 1.56* 1.74*  CALCIUM 8.7* 9.1   PT/INR No results for input(s): LABPROT, INR in the last 72 hours. ABG No results for input(s): PHART, HCO3 in the last 72 hours.  Invalid input(s): PCO2, PO2  Studies/Results: Dg Abd 2 Views  Result Date: 11/30/2017 CLINICAL DATA:  Abdominal bloating, rectal pain with bowel movements. EXAM: ABDOMEN - 2 VIEW COMPARISON:  Abdominal radiographs of November 29, 2017 FINDINGS: There remain multiple air in fluid levels within loops of colon in the mid and upper abdomen bilaterally. There are few small air-fluid levels in the mid abdomen. No significant rectal gas is observed. No free extraluminal gas collections are demonstrated. A left nephrostomy tube appears stable. There are postsurgical changes in the lower lumbar spine. There is stable moderate dextrocurvature centered at L2-3. IMPRESSION: There has been distal migration of small bowel gas into the colon. No evidence of obstruction is evident. There is no significant rectal gas however. There is no evidence of perforation. Electronically Signed   By: David  Martinique M.D.   On: 11/30/2017 14:49     Assessment and Plan: His Cr has continued to climb.    I have placed an order for a right percutaneous nephrostomy tube for tomorrow and made him NPO post MN.   He might be able to have that tube converted to a stent, but it is unlikely that the left will be stentable.    T4 Urothelial carcinoma.   The rectal biopsies were positive for infiltration of cancer in the rectal wall and Dr. Barry Dienes found abnormal tissue in the retroperitoneum suspicious for tumor spread.  Biopsy is pending.    Dr. [name] has seen for consideration of chemotherapy.   If he has a dramatic response to chemo, he may become a candidate for a left  nephroureterectomy and cystectomy but that will not be anytime soon.       LOS: 4 days    Charles Coffey 12/02/2017 [phone] ID: Charles Coffey, male   DOB: Feb 22, 1935, 82 y.o.   MRN: [id number]

## 2017-12-02 NOTE — Progress Notes (Signed)
Request just received from urology for rt PCN placement in pt. See note from earlier today. He is currently stable; lungs CTA bilat; heart- RRR; abd- soft,+BS, intact LLQ ostomy; left PCN ok. Risks and benefits of procedure were discussed with the patient including, but not limited to, infection, bleeding, significant bleeding causing loss or decrease in renal function or damage to adjacent structures.   All of the patient's questions were answered, patient is agreeable to proceed.  Consent signed and in chart. Procedure tent planned for 12/7 if adequate hydronephrosis present on imaging tomorrow.

## 2017-12-02 NOTE — Care Management Important Message (Signed)
Important Message  Patient Details  Name: SHAKIR PETROSINO MRN: [id number] Date of Birth: 08/15/1935   Medicare Important Message Given:  Yes    Kerin Salen 12/02/2017, 10:21 AMImportant Message  Patient Details  Name: SHEPHERD FINNAN MRN: [id number] Date of Birth: 1935/09/22   Medicare Important Message Given:  Yes    Kerin Salen 12/02/2017, 10:20 AM

## 2017-12-02 NOTE — Progress Notes (Addendum)
PROGRESS NOTE    Charles Coffey  DVV:[id number] DOB: 16-Mar-1935 DOA: 11/28/2017 PCP: [name], MD   Brief Narrative:  Patient is Charles Coffey 82 year old gentleman history of invasive urothelial carcinoma involving left urethra and trigone status post recent cystoscopy and left retrograde pyelogram with TURBT on 11/25/2017, history of hypertension hyperlipidemia presenting to the ED with fever as high as 101, constipation noted to have fever, ileus, moderate bilateral hydronephrosis.  Patient underwent left percutaneous nephrostomy on admission.  Urology following.  Oncology consulted.  Assessment & Plan:   Principal Problem:   Fever Active Problems:   Hyperlipidemia LDL goal <70   RESTLESS LEGS SYNDROME   Essential hypertension   Coronary artery disease of native heart with stable angina pectoris (HCC)   GERD   Cardiomyopathy, ischemic   Chronic low back pain   PAD (peripheral artery disease) (HCC)   Anemia   Stage 3 chronic kidney disease (HCC)   Hydronephrosis due to obstructive malignant neoplasm of bladder (HCC)   Pyelonephritis   Ileus (HCC)   Elevated troponin   Urothelial carcinoma of bladder (HCC)   Dehydration   Thrombocytopenia (HCC)   Low vitamin B12 level   Irritable bowel syndrome with constipation   Rectal stenosis   #1 fever  Probable left-sided pyelonephritis Thought secondary to acute pyelonephritis as patient with recent instrumentation in the bladder with cystoscopy, left retrograde pyelogram and TURBT on 11/25/2017. Patient also noted to have left flank pain on presentation as well as fever and malaise.    Urine cultures with insignificant and no growth.   Blood cultures NGTD x 4 days. Chest x-ray negative for any acute infiltrate.  CT abdomen and pelvis with perinephric stranding on the left and periurethral stranding as well as moderate bilateral hydronephrosis.  Continue empiric IV Rocephin.  vancoymcin has been discontinued. Consider transition to  oral antibiotics as able to complete Amiel Mccaffrey 10-14-day course of antibiotic treatment.  2. Bilateral hydronephrosis/malignant obstruction of left kidney from invasive urothelial carcinoma Patient has been seen in consultation by urology. Urology has consulted interventional radiology and patient status post left percutaneous nephrostomy tube placement 11/28/2017 per Dr. Kathlene Cote.  Urology planning for R percutaneous nephrostomy tube for 12/7 (see Dr. [name] note from 12/6)  3. Rectal Stenosis  S/p Diverting colostomy - seen on colonoscopy, biopsies pending.  GI suspects invasion from bladder cancer. - Appreciate surgery c/s, now s/p diverting colostomy 12/5  4. Elevated troponin Likely demand ischemia. Patient had one episode of chest pain Hunter Bachar day prior to admission which has since resolved. EKG with Lawson Isabell right bundle branch block with left anterior fascicular block with no significant change from prior EKG. Patient with recent 2D echo done in September 2018 as such we will not repeat at this time.   Cardiac enzymes trending down.  No further workup at this time.  Continue home regimen of cardiac medications. Previous provider [facility] who are in agreement.    #5 history of IBS Patient complaint of lower abdominal cramping and discomfort.  Patient states has not had Randolf Sansoucie bowel movement.  On presentation patient noted to have an ileus was placed on bowel rest and it does seem to have resolved.  Patient with positive bowel sounds and tolerating oral intake.  Continue home regimen of Amitiza.  Symptoms likely due in large part to rectal stenosis.   - GI c/s as above, s/p colonoscopy on 12/5  6. Chronic kidney disease stage III Worsening creatinine today, continue to monitor, will restart IVF  Urology has ordered R perc tube given mild hydro on that side  7. Dehydration Improved  8. Hypertension Blood pressure stable.  Continue current regimen of imdur and Coreg.   9. Coronary  artery disease/history of cardiomyopathy Patient noted to have elevated troponins.  Enzymes were elevated however trending down.  Likely secondary to acute illness.Patient currently asymptomatic. Patient with Ramata Strothman recent 2D echo and as such we will not repeat at this time.  Continue current home oral cardiac medications.  Outpatient follow-up.  #11 anemia No overt bleeding. Likely secondary to anemia of chronic disease. Transfusion threshold hemoglobin less than 7.  H&H has remained stable.  Follow. With low iron and sat ratio, consider IV iron when able or d/c with PO iron  12. Thrombocytopenia Questionable etiology. Likely secondary to acute infection.  LDH at 210. Haptoglobin at 233. Patient has been seen by hematology/oncology who recommend monitoring at this time.  Follow.   13. Urothelial carcinoma of the bladder Patient being followed by urology.  Oncology has been consulted and are following along with urology.  Per urology.  #14 low vitamin B12 levels Started on B12 injections, but 12/3 value normal.  Will d/c B12 injections.  Can consider repeat B12/MMA as outpatient.   DVT prophylaxis: SCD Code Status: full Family Communication: wife at bedside Disposition Plan: pending   Consultants:   IR  Urology  Oncology  GI  Surgery  Procedures: (Don't include imaging studies which can be auto populated. Include things that cannot be auto populated i.e. Echo, Carotid and venous dopplers, Foley, Bipap, HD, tubes/drains, wound vac, central lines etc)  Perc nephrostomy 12/2  Colonoscopy 12/5  Diverting colostomy 12/5  Antimicrobials: (specify start and planned stop date. Auto populated tables are space occupying and do not give end dates) Anti-infectives (From admission, onward)   Start     Dose/Rate Route Frequency Ordered Stop   11/29/17 0000  cefTRIAXone (ROCEPHIN) 2 g in dextrose 5 % 50 mL IVPB  Status:  Discontinued     2 g 100 mL/hr over 30 Minutes  Intravenous Every 24 hours 11/28/17 1921 12/01/17 1631   11/29/17 0000  vancomycin (VANCOCIN) IVPB 1000 mg/200 mL premix  Status:  Discontinued     1,000 mg 200 mL/hr over 60 Minutes Intravenous Every 24 hours 11/28/17 1934 11/30/17 1229   11/28/17 1745  cefTRIAXone (ROCEPHIN) 2 g in dextrose 5 % 50 mL IVPB  Status:  Discontinued     2 g 100 mL/hr over 30 Minutes Intravenous  Once 11/28/17 1734 11/28/17 1735   11/28/17 1745  piperacillin-tazobactam (ZOSYN) IVPB 3.375 g     3.375 g 100 mL/hr over 30 Minutes Intravenous  Once 11/28/17 1734 11/28/17 1902   11/28/17 1745  vancomycin (VANCOCIN) IVPB 1000 mg/200 mL premix     1,000 mg 200 mL/hr over 60 Minutes Intravenous  Once 11/28/17 1734 11/28/17 2044        Subjective: Feeling well. Mild abdominal TTP. No other c/o. Notes had disagreement with nurse last night.  Didn't like bed alarm being turned.   Objective: Vitals:   12/01/17 2019 12/02/17 0116 12/02/17 0525 12/02/17 0800  BP: 101/66 (!) 100/56 121/76   Pulse: 76 64 76   Resp: 18 16 20    Temp: 98 F (36.7 C) 98.1 F (36.7 C) 97.7 F (36.5 C)   TempSrc: Oral Oral Oral   SpO2: 97% 97% 98% 98%  Weight:   92.2 kg (203 lb 4.2 oz)   Height:  Intake/Output Summary (Last 24 hours) at 12/02/2017 1119 Last data filed at 12/02/2017 0525 Gross per 24 hour  Intake 1250 ml  Output 1530 ml  Net -280 ml   Filed Weights   12/01/17 0544 12/01/17 0819 12/02/17 0525  Weight: 90.3 kg (199 lb 1.2 oz) 88 kg (194 lb) 92.2 kg (203 lb 4.2 oz)    Examination:  General exam: Appears calm and comfortable  Respiratory system: Clear to auscultation. Respiratory effort normal. Cardiovascular system: S1 & S2 heard, RRR. No JVD, murmurs, rubs, gallops or clicks. No pedal edema. Gastrointestinal system: Abdomen is nondistended, soft and appropriately tender. No organomegaly or masses felt. Normal bowel sounds heard.  Ostomy in place with stool.  Laparoscopic incisions. Central nervous  system: Alert and oriented. No focal neurological deficits. Extremities: Symmetric 5 x 5 power. Skin: No rashes, lesions or ulcers Psychiatry: Judgement and insight appear normal. Mood & affect appropriate.     Data Reviewed: I have personally reviewed following labs and imaging studies  CBC: Recent Labs  Lab 11/28/17 1518 11/29/17 0540 11/30/17 0555 12/01/17 0645 12/02/17 0611  WBC 9.9 6.8 6.8 6.3 8.5  NEUTROABS 6.9  --   --  3.8  --   HGB 9.4* 8.1* 9.2* 9.1* 9.3*  HCT 27.5* 24.3* 27.1* 26.7* 27.4*  MCV 91.4 90.7 90.3 90.2 91.3  PLT 72* 59* 60* 61* 86*   Basic Metabolic Panel: Recent Labs  Lab 11/28/17 1518 11/29/17 0540 11/29/17 0820 11/30/17 0555 12/01/17 0645 12/02/17 0611  NA 133* 134*  --  132* 136 139  K 4.3 4.4  --  4.3 3.7 4.7  CL 104 105  --  103 107 107  CO2 20* 21*  --  22 21* 21*  GLUCOSE 100* 98  --  114* 102* 141*  BUN 24* 26*  --  24* 22* 25*  CREATININE 1.56* 1.55*  --  1.49* 1.56* 1.74*  CALCIUM 9.1 8.5*  --  9.0 8.7* 9.1  MG  --  2.3 2.7*  --   --   --    GFR: Estimated Creatinine Clearance: 37.6 mL/min (Tatsuo Musial) (by C-G formula based on SCr of 1.74 mg/dL (H)). Liver Function Tests: Recent Labs  Lab 11/28/17 1518 12/02/17 0611  AST 23 32  ALT 18 18  ALKPHOS 51 50  BILITOT 1.1 0.8  PROT 6.7 6.7  ALBUMIN 4.0 3.7   No results for input(s): LIPASE, AMYLASE in the last 168 hours. No results for input(s): AMMONIA in the last 168 hours. Coagulation Profile: No results for input(s): INR, PROTIME in the last 168 hours. Cardiac Enzymes: Recent Labs  Lab 11/28/17 1922 11/29/17 0112 11/29/17 0710  TROPONINI 0.20* 0.17* 0.13*   BNP (last 3 results) No results for input(s): PROBNP in the last 8760 hours. HbA1C: No results for input(s): HGBA1C in the last 72 hours. CBG: Recent Labs  Lab 11/30/17 0751 12/01/17 0741 12/02/17 0753  GLUCAP 107* 103* 122*   Lipid Profile: No results for input(s): CHOL, HDL, LDLCALC, TRIG, CHOLHDL, LDLDIRECT  in the last 72 hours. Thyroid Function Tests: No results for input(s): TSH, T4TOTAL, FREET4, T3FREE, THYROIDAB in the last 72 hours. Anemia Panel: No results for input(s): VITAMINB12, FOLATE, FERRITIN, TIBC, IRON, RETICCTPCT in the last 72 hours. Sepsis Labs: Recent Labs  Lab 11/28/17 1538  LATICACIDVEN 0.72    Recent Results (from the past 240 hour(s))  Culture, blood (Routine X 2) w Reflex to ID Panel     Status: None (Preliminary result)   Collection  Time: 11/28/17  3:18 PM  Result Value Ref Range Status   Specimen Description BLOOD BLOOD RIGHT HAND  Final   Special Requests   Final    BOTTLES DRAWN AEROBIC AND ANAEROBIC Blood Culture adequate volume   Culture   Final    NO GROWTH 3 DAYS Performed at [facility], [address] [place], [place] [address]    Report Status PENDING  Incomplete  Culture, blood (Routine X 2) w Reflex to ID Panel     Status: None (Preliminary result)   Collection Time: 11/28/17  3:23 PM  Result Value Ref Range Status   Specimen Description BLOOD BLOOD LEFT FOREARM  Final   Special Requests   Final    BOTTLES DRAWN AEROBIC AND ANAEROBIC Blood Culture adequate volume   Culture   Final    NO GROWTH 3 DAYS Performed at [facility], [place] [address], [place], [place] [address]    Report Status PENDING  Incomplete  Culture, Urine     Status: Abnormal   Collection Time: 11/28/17  6:18 PM  Result Value Ref Range Status   Specimen Description URINE, CLEAN CATCH  Final   Special Requests NONE  Final   Culture (Ryken Paschal)  Final    <10,000 COLONIES/mL INSIGNIFICANT GROWTH Performed at [facility] [address] [place], [place] [address]    Report Status 11/30/2017 FINAL  Final  Urine culture     Status: None   Collection Time: 11/28/17  8:18 PM  Result Value Ref Range Status   Specimen Description KIDNEY  Final   Special Requests   Final    ASPIRATE OF URINE FROM LEFT RENAL COLLECTING SYSTEM   Culture   Final    NO  GROWTH Performed at [facility], [place] [address] [place], [place] [address]    Report Status 11/30/2017 FINAL  Final         Radiology Studies: Dg Abd 2 Views  Result Date: 11/30/2017 CLINICAL DATA:  Abdominal bloating, rectal pain with bowel movements. EXAM: ABDOMEN - 2 VIEW COMPARISON:  Abdominal radiographs of November 29, 2017 FINDINGS: There remain multiple air in fluid levels within loops of colon in the mid and upper abdomen bilaterally. There are few small air-fluid levels in the mid abdomen. No significant rectal gas is observed. No free extraluminal gas collections are demonstrated. Shandon Matson left nephrostomy tube appears stable. There are postsurgical changes in the lower lumbar spine. There is stable moderate dextrocurvature centered at L2-3. IMPRESSION: There has been distal migration of small bowel gas into the colon. No evidence of obstruction is evident. There is no significant rectal gas however. There is no evidence of perforation. Electronically Signed   By: David  Martinique M.D.   On: 11/30/2017 14:49        Scheduled Meds: . carvedilol  6.25 mg Oral BID WC  . isosorbide mononitrate  60 mg Oral Daily  . [START ON 12/04/2017] lubiprostone  24 mcg Oral BID WC  . pramipexole  0.25 mg Oral TID  . sodium chloride flush  5 mL Intravenous Q8H   Continuous Infusions: . acetaminophen Stopped (12/02/17 0714)  . famotidine (PEPCID) IV 20 mg (12/02/17 1103)     LOS: 4 days    Time spent: over 30 minutes    Fayrene Helper, MD [facility] Pager [phone]  If 7PM-7AM, please contact night-coverage [email or web address] Password [email or web address] 12/02/2017, 11:19 AM

## 2017-12-02 NOTE — Progress Notes (Signed)
[facility]/Trauma Progress Note  1 Day Post-Op   Assessment/Plan Principal Problem:   Fever Active Problems:   Hyperlipidemia LDL goal <70   RESTLESS LEGS SYNDROME   Essential hypertension   Coronary artery disease of native heart with stable angina pectoris (HCC)   GERD   Cardiomyopathy, ischemic   Chronic low back pain   PAD (peripheral artery disease) (HCC)   Anemia   Stage 3 chronic kidney disease (HCC)   Hydronephrosis due to obstructive malignant neoplasm of bladder (HCC)   Pyelonephritis   Ileus (HCC)   Elevated troponin   Urothelial carcinoma of bladder (HCC)   Dehydration   Thrombocytopenia (HCC)   Low vitamin B12 level   Irritable bowel syndrome with constipation   Rectal stenosis  Rectal obstruction secondary to bladder cancer - S/P laparoscopic diverting colostomy, Dr. Barry Dienes, 12/05  FEN: clears VTE: SCD's, heparin ID: none currenlty Follow up: Dr. Barry Dienes in 2 weeks after discharge  DISPO: having ostomy output, clears and advance to fulls as tolerated    LOS: 4 days    Subjective:  CC: abdominal pain  Pain is mild. Pt is hungry. No nausea or vomiting. Good ostomy output. Wife at bedside.   Objective: Vital signs in last 24 hours: Temp:  [97.7 F (36.5 C)-98.3 F (36.8 C)] 97.7 F (36.5 C) (12/06 0525) Pulse Rate:  [64-76] 76 (12/06 0525) Resp:  [14-20] 20 (12/06 0525) BP: (100-138)/(56-85) 121/76 (12/06 0525) SpO2:  [97 %-100 %] 98 % (12/06 0800) Weight:  [203 lb 4.2 oz (92.2 kg)] 203 lb 4.2 oz (92.2 kg) (12/06 0525) Last BM Date: 12/02/17  Intake/Output from previous day: 12/05 0701 - 12/06 0700 In: 1550 [I.V.:1250; IV Piggyback:300] Out: 1530 [Urine:1130; Stool:350] Intake/Output this shift: No intake/output data recorded.  PE: Gen:  Alert, NAD, pleasant, cooperative Card:  RRR, no M/G/R heard Pulm:  Rate and effort normal Abd: Soft, not distended, +BS, incisions C/D/I, ostomy patent and pink, lots of brown stool  and gas in bag, appropriately tender without guarding Skin: no rashes noted, warm and dry   Anti-infectives: Anti-infectives (From admission, onward)   Start     Dose/Rate Route Frequency Ordered Stop   11/29/17 0000  cefTRIAXone (ROCEPHIN) 2 g in dextrose 5 % 50 mL IVPB  Status:  Discontinued     2 g 100 mL/hr over 30 Minutes Intravenous Every 24 hours 11/28/17 1921 12/01/17 1631   11/29/17 0000  vancomycin (VANCOCIN) IVPB 1000 mg/200 mL premix  Status:  Discontinued     1,000 mg 200 mL/hr over 60 Minutes Intravenous Every 24 hours 11/28/17 1934 11/30/17 1229   11/28/17 1745  cefTRIAXone (ROCEPHIN) 2 g in dextrose 5 % 50 mL IVPB  Status:  Discontinued     2 g 100 mL/hr over 30 Minutes Intravenous  Once 11/28/17 1734 11/28/17 1735   11/28/17 1745  piperacillin-tazobactam (ZOSYN) IVPB 3.375 g     3.375 g 100 mL/hr over 30 Minutes Intravenous  Once 11/28/17 1734 11/28/17 1902   11/28/17 1745  vancomycin (VANCOCIN) IVPB 1000 mg/200 mL premix     1,000 mg 200 mL/hr over 60 Minutes Intravenous  Once 11/28/17 1734 11/28/17 2044      Lab Results:  Recent Labs    12/01/17 0645 12/02/17 0611  WBC 6.3 8.5  HGB 9.1* 9.3*  HCT 26.7* 27.4*  PLT 61* 86*   BMET Recent Labs    12/01/17 0645 12/02/17 0611  NA 136 139  K 3.7 4.7  CL 107  107  CO2 21* 21*  GLUCOSE 102* 141*  BUN 22* 25*  CREATININE 1.56* 1.74*  CALCIUM 8.7* 9.1   PT/INR No results for input(s): LABPROT, INR in the last 72 hours. CMP     Component Value Date/Time   NA 139 12/02/2017 0611   NA 139 08/31/2017 1135   K 4.7 12/02/2017 0611   CL 107 12/02/2017 0611   CO2 21 (L) 12/02/2017 0611   GLUCOSE 141 (H) 12/02/2017 0611   BUN 25 (H) 12/02/2017 0611   BUN 22 08/31/2017 1135   CREATININE 1.74 (H) 12/02/2017 0611   CREATININE 1.23 (H) 05/19/2016 1447   CALCIUM 9.1 12/02/2017 0611   PROT 6.7 12/02/2017 0611   PROT 6.2 09/15/2017 1420   ALBUMIN 3.7 12/02/2017 0611   AST 32 12/02/2017 0611   ALT 18  12/02/2017 0611   ALKPHOS 50 12/02/2017 0611   BILITOT 0.8 12/02/2017 0611   GFRNONAA 35 (L) 12/02/2017 0611   GFRAA 41 (L) 12/02/2017 0611   Lipase  No results found for: LIPASE  Studies/Results: Dg Abd 2 Views  Result Date: 11/30/2017 CLINICAL DATA:  Abdominal bloating, rectal pain with bowel movements. EXAM: ABDOMEN - 2 VIEW COMPARISON:  Abdominal radiographs of November 29, 2017 FINDINGS: There remain multiple air in fluid levels within loops of colon in the mid and upper abdomen bilaterally. There are few small air-fluid levels in the mid abdomen. No significant rectal gas is observed. No free extraluminal gas collections are demonstrated. A left nephrostomy tube appears stable. There are postsurgical changes in the lower lumbar spine. There is stable moderate dextrocurvature centered at L2-3. IMPRESSION: There has been distal migration of small bowel gas into the colon. No evidence of obstruction is evident. There is no significant rectal gas however. There is no evidence of perforation. Electronically Signed   By: David  Martinique M.D.   On: 11/30/2017 14:49      Kalman Drape , [facility] Surgery 12/02/2017, 11:06 AM Pager: [phone] Consults: [phone] Mon-Fri 7:00 am-4:30 pm Sat-Sun 7:00 am-11:30 am

## 2017-12-02 NOTE — Consult Note (Signed)
[facility] Nurse ostomy consult note:  POD #1 Stoma type/location: LLQ colostomy with rod Stomal assessment/size: 2 inches round, raised, edematous, moist with rod at 12 and 6 o'clock Peristomal assessment: intact, clear Treatment options for stomal/peristomal skin: skin barrier ring placed around stoma and beneath rod Output: liquid brown stool with flatus Ostomy pouching: 2pc. 2 and 3/4 inch pouching system with skin barrier ring Education provided: Patient taught that stoma was edematous (like other mucous membranes would be after injury) and that the size would shrink after 2-4 weeks.  Rational provided for resizing ostomy and ensuring a good fit to maintain skin integrity.  Patient able to give return demon on pouch tail closure opening and closing (Lock and Roll Closure).  Wife not in room for session.  Educational booklet left at bedside. Supplies (4 pouches, 4 skin barriers and 4 skin barrier rings) left at bedside. Enrolled patient in [place] program: No  WOC nursing team will follow, and will remain available to this patient, the nursing, surgical and medical teams.   Thanks, Maudie Flakes, MSN, RN, [place], Arther Abbott  Pager# [phone]

## 2017-12-02 NOTE — Progress Notes (Addendum)
[facility] Progress Note    Since last GI note: Limited colonoscopy, biopsy yesterday followed by diverting colostomy with Dr. Barry Dienes for rectal stenosis that is presumed to be from nearby bladder cancer invading the rectum.  Overnight he was confused and is still a bit confused this morning.  He is hungry.  Objective: Vital signs in last 24 hours: Temp:  [97.7 F (36.5 C)-98.3 F (36.8 C)] 97.7 F (36.5 C) (12/06 0525) Pulse Rate:  [57-76] 76 (12/06 0525) Resp:  [11-25] 20 (12/06 0525) BP: (100-154)/(56-86) 121/76 (12/06 0525) SpO2:  [96 %-100 %] 98 % (12/06 0800) Weight:  [194 lb (88 kg)-203 lb 4.2 oz (92.2 kg)] 203 lb 4.2 oz (92.2 kg) (12/06 0525) Last BM Date: 12/01/17 General: alert and oriented times 2 Heart: regular rate and rythm Abdomen: soft, non-tender, non-distended, normal bowel sounds; left sided colostomy bag with liquid brown stool and gas   Lab Results: Recent Labs    11/30/17 0555 12/01/17 0645 12/02/17 0611  WBC 6.8 6.3 8.5  HGB 9.2* 9.1* 9.3*  PLT 60* 61* 86*  MCV 90.3 90.2 91.3   Recent Labs    11/30/17 0555 12/01/17 0645 12/02/17 0611  NA 132* 136 139  K 4.3 3.7 4.7  CL 103 107 107  CO2 22 21* 21*  GLUCOSE 114* 102* 141*  BUN 24* 22* 25*  CREATININE 1.49* 1.56* 1.74*  CALCIUM 9.0 8.7* 9.1   Recent Labs    12/02/17 0611  PROT 6.7  ALBUMIN 3.7  AST 32  ALT 18  ALKPHOS 50  BILITOT 0.8     Medications: Scheduled Meds: . carvedilol  6.25 mg Oral BID WC  . isosorbide mononitrate  60 mg Oral Daily  . [START ON 12/04/2017] lubiprostone  24 mcg Oral BID WC  . pramipexole  0.25 mg Oral TID  . sodium chloride flush  5 mL Intravenous Q8H   Continuous Infusions: . acetaminophen Stopped (12/02/17 0714)  . famotidine (PEPCID) IV Stopped (12/02/17 0118)   PRN Meds:.albuterol, bisacodyl, hydrocortisone, morphine injection, nitroGLYCERIN, ondansetron **OR** ondansetron (ZOFRAN) IV, oxyCODONE    Assessment/Plan: 82 y.o.  male with high grade bladder cancer causing rectal stenosis;   Left sided diverting colostomy yesterday, seems to be functioning well this morning with liquid stool and gas in the bag. Surgery to decide on diet advance.  Patient and his wife are eager to discuss the plan for his upcoming oncologic care.  Please call or page with any further questions or concerns.  I will contact him with final path results from the rectal biopsies yesterday.   Milus Banister, MD  12/02/2017, 8:18 AM [facility] Pager [phone]   Addendum:  I was called by pathology; the rectal biopsies show carcinoma undermining the rectal mucosa. I explained the results to him and his wife.

## 2017-12-02 NOTE — Progress Notes (Signed)
Entered pt's room-pt standing at bedside with his gown off.  Iv tubing wrapped around his body.  neph bag hanging down.  Foley bag / tubing hanging on top side rail with tubing stretched.  Colostomy bag with 500 cc liquid brown stool that pt was pulling on.  Pt had obtained a pair of underwear from his belongings and put them on over his foley and neph tube.  Pt was instructed that he wasn't to get up without assistance due to having had surgery and pain meds.  Difficult to redirect back to bed.  Pt kept saying that he is 82 years old and can do whatever he wants.  Explained that I am his nurse caring for him tonight and that we are concerned for his safety.  That we don't want him to fall.  Pt angry at me and told me to get out of his room.  Bed alarm turned on after pt returned to bed.

## 2017-12-02 NOTE — Progress Notes (Signed)
Events of the last few days noted.  Patient continues to have GI complaints including abdominal pain and distention and subsequently underwent a colonoscopy by Dr. Ardis Hughs which found to have a colonic stricture.  He underwent laparoscopic diverting colostomy on 12/01/2017 by Dr. Barry Dienes.  Etiology of his strictures likely related to bladder tumor invading into the rectum indicating at least T4 disease.  He is recovering well at this time with significant improvement in his GI symptoms.  His kidney function however continues to decline with creatinine of 1.74 and a creatinine clearance of less than 40 cc/min.  His platelet count remained relatively stable and slightly improved at 86.  He continues to have right-sided hydronephrosis that has not been addressed at this time.  His left-sided hydronephrosis was relieved with percutaneous right nephrostomy tube.  The natural course of this disease as well as these new findings were discussed again with the patient in detail he has a complex presentation with a locally advanced disease.  It remains unclear to me whether curative surgery can be done at this time and he remains borderline candidate for cisplatin based chemotherapy.  To consider him for curative chemotherapy regimen (cispplatin based), his kidney function has to be much improved given the possibility he will require nephrectomy as well.  If curative surgery cannot be done, he would be a good candidate for palliative chemotherapy that will not require normal kidney function.  A carboplatin-based chemotherapy regimen can be used in the setting.  I agree with potentially doing a right-sided nephrostomy tube and attempt to improve his kidney function.  His case will be discussed in the GU tumor board on 12/10/2017 for multidisciplinary discussion.  At that time, we will have consensus opinion especially if his kidney function improves after nephrostomy tube placement.

## 2017-12-03 ENCOUNTER — Inpatient Hospital Stay (HOSPITAL_COMMUNITY): Payer: Medicare HMO

## 2017-12-03 ENCOUNTER — Encounter (HOSPITAL_COMMUNITY): Payer: Self-pay | Admitting: Diagnostic Radiology

## 2017-12-03 HISTORY — PX: IR NEPHROSTOMY PLACEMENT RIGHT: IMG6064

## 2017-12-03 LAB — CBC
HEMATOCRIT: 23.8 % — AB (ref 39.0–52.0)
HEMOGLOBIN: 8 g/dL — AB (ref 13.0–17.0)
MCH: 31 pg (ref 26.0–34.0)
MCHC: 33.6 g/dL (ref 30.0–36.0)
MCV: 92.2 fL (ref 78.0–100.0)
Platelets: 57 10*3/uL — ABNORMAL LOW (ref 150–400)
RBC: 2.58 MIL/uL — AB (ref 4.22–5.81)
RDW: 16.2 % — ABNORMAL HIGH (ref 11.5–15.5)
WBC: 8.2 10*3/uL (ref 4.0–10.5)

## 2017-12-03 LAB — CULTURE, BLOOD (ROUTINE X 2)
CULTURE: NO GROWTH
Culture: NO GROWTH
SPECIAL REQUESTS: ADEQUATE
SPECIAL REQUESTS: ADEQUATE

## 2017-12-03 LAB — BASIC METABOLIC PANEL
ANION GAP: 8 (ref 5–15)
BUN: 21 mg/dL — ABNORMAL HIGH (ref 6–20)
CHLORIDE: 109 mmol/L (ref 101–111)
CO2: 21 mmol/L — ABNORMAL LOW (ref 22–32)
Calcium: 8.8 mg/dL — ABNORMAL LOW (ref 8.9–10.3)
Creatinine, Ser: 1.56 mg/dL — ABNORMAL HIGH (ref 0.61–1.24)
GFR, EST AFRICAN AMERICAN: 47 mL/min — AB (ref >60)
GFR, EST NON AFRICAN AMERICAN: 40 mL/min — AB (ref >60)
Glucose, Bld: 97 mg/dL (ref 65–99)
POTASSIUM: 4.1 mmol/L (ref 3.5–5.1)
SODIUM: 138 mmol/L (ref 135–145)

## 2017-12-03 LAB — PROTIME-INR
INR: 1.22
Prothrombin Time: 15.3 seconds — ABNORMAL HIGH (ref 11.4–15.2)

## 2017-12-03 LAB — GLUCOSE, CAPILLARY: Glucose-Capillary: 98 mg/dL (ref 65–99)

## 2017-12-03 MED ORDER — LIDOCAINE HCL (PF) 1 % IJ SOLN
INTRAMUSCULAR | Status: AC | PRN
  Administered 2017-12-03: 10 mL

## 2017-12-03 MED ORDER — FENTANYL CITRATE (PF) 100 MCG/2ML IJ SOLN
INTRAMUSCULAR | Status: AC | PRN
  Administered 2017-12-03: 50 ug via INTRAVENOUS

## 2017-12-03 MED ORDER — VANCOMYCIN HCL IN DEXTROSE 1-5 GM/200ML-% IV SOLN
INTRAVENOUS | Status: AC
  Administered 2017-12-03: 1 g
  Filled 2017-12-03: qty 200

## 2017-12-03 MED ORDER — MIDAZOLAM HCL 2 MG/2ML IJ SOLN
INTRAMUSCULAR | Status: AC
  Filled 2017-12-03: qty 4

## 2017-12-03 MED ORDER — FENTANYL CITRATE (PF) 100 MCG/2ML IJ SOLN
INTRAMUSCULAR | Status: AC
  Filled 2017-12-03: qty 4

## 2017-12-03 MED ORDER — MIDAZOLAM HCL 2 MG/2ML IJ SOLN
INTRAMUSCULAR | Status: AC | PRN
  Administered 2017-12-03: 1 mg via INTRAVENOUS

## 2017-12-03 MED ORDER — ACETAMINOPHEN 500 MG PO TABS
1000.0000 mg | ORAL_TABLET | Freq: Three times a day (TID) | ORAL | Status: DC
  Administered 2017-12-03 – 2017-12-04 (×3): 1000 mg via ORAL
  Filled 2017-12-03 (×3): qty 2

## 2017-12-03 MED ORDER — IOPAMIDOL (ISOVUE-300) INJECTION 61%
INTRAVENOUS | Status: AC
  Administered 2017-12-03: 10 mL
  Filled 2017-12-03: qty 50

## 2017-12-03 MED ORDER — LIDOCAINE HCL 1 % IJ SOLN
INTRAMUSCULAR | Status: AC
  Filled 2017-12-03: qty 20

## 2017-12-03 NOTE — Sedation Documentation (Signed)
Patient is resting comfortably. 
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
## 2017-12-03 NOTE — Procedures (Signed)
Successful placement of right nephrostomy tube.  Minimal blood loss and no immediate complication.

## 2017-12-03 NOTE — Sedation Documentation (Signed)
Patient denies pain and is resting comfortably.  
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
## 2017-12-04 DIAGNOSIS — Z933 Colostomy status: Secondary | ICD-10-CM

## 2017-12-04 LAB — GLUCOSE, CAPILLARY: Glucose-Capillary: 99 mg/dL (ref 65–99)

## 2017-12-04 LAB — CBC WITH DIFFERENTIAL/PLATELET
Basophils Absolute: 0 10*3/uL (ref 0.0–0.1)
Basophils Relative: 0 %
EOS ABS: 0.1 10*3/uL (ref 0.0–0.7)
Eosinophils Relative: 1 %
HEMATOCRIT: 23 % — AB (ref 39.0–52.0)
HEMOGLOBIN: 7.8 g/dL — AB (ref 13.0–17.0)
LYMPHS ABS: 1.3 10*3/uL (ref 0.7–4.0)
LYMPHS PCT: 21 %
MCH: 31.6 pg (ref 26.0–34.0)
MCHC: 33.9 g/dL (ref 30.0–36.0)
MCV: 93.1 fL (ref 78.0–100.0)
MONOS PCT: 9 %
Monocytes Absolute: 0.6 10*3/uL (ref 0.1–1.0)
NEUTROS PCT: 69 %
Neutro Abs: 4.2 10*3/uL (ref 1.7–7.7)
Platelets: 54 10*3/uL — ABNORMAL LOW (ref 150–400)
RBC: 2.47 MIL/uL — ABNORMAL LOW (ref 4.22–5.81)
RDW: 16.7 % — ABNORMAL HIGH (ref 11.5–15.5)
WBC: 6.2 10*3/uL (ref 4.0–10.5)

## 2017-12-04 LAB — BASIC METABOLIC PANEL
Anion gap: 6 (ref 5–15)
BUN: 17 mg/dL (ref 6–20)
CHLORIDE: 107 mmol/L (ref 101–111)
CO2: 23 mmol/L (ref 22–32)
CREATININE: 1.46 mg/dL — AB (ref 0.61–1.24)
Calcium: 8.5 mg/dL — ABNORMAL LOW (ref 8.9–10.3)
GFR calc Af Amer: 51 mL/min — ABNORMAL LOW (ref >60)
GFR calc non Af Amer: 44 mL/min — ABNORMAL LOW (ref >60)
GLUCOSE: 100 mg/dL — AB (ref 65–99)
POTASSIUM: 4 mmol/L (ref 3.5–5.1)
Sodium: 136 mmol/L (ref 135–145)

## 2017-12-04 MED ORDER — PROCHLORPERAZINE EDISYLATE 5 MG/ML IJ SOLN: 5.0000 mg | INTRAMUSCULAR | Status: DC | PRN

## 2017-12-04 MED ORDER — SODIUM CHLORIDE 0.9% FLUSH: 3.0000 mL | INTRAVENOUS | Status: DC | PRN

## 2017-12-04 MED ORDER — DIPHENHYDRAMINE HCL 25 MG PO CAPS: 25.0000 mg | ORAL_CAPSULE | Freq: Four times a day (QID) | ORAL | Status: DC | PRN

## 2017-12-04 MED ORDER — PHENOL 1.4 % MT LIQD: 2.0000 | OROMUCOSAL | Status: DC | PRN

## 2017-12-04 MED ORDER — SACCHAROMYCES BOULARDII 250 MG PO CAPS
250.0000 mg | ORAL_CAPSULE | Freq: Two times a day (BID) | ORAL | Status: DC
  Administered 2017-12-04: 250 mg via ORAL
  Filled 2017-12-04: qty 1

## 2017-12-04 MED ORDER — METHOCARBAMOL 500 MG PO TABS: 1000.0000 mg | ORAL_TABLET | Freq: Four times a day (QID) | ORAL | Status: DC | PRN

## 2017-12-04 MED ORDER — DIPHENHYDRAMINE HCL 50 MG/ML IJ SOLN: 12.5000 mg | Freq: Four times a day (QID) | INTRAMUSCULAR | Status: DC | PRN

## 2017-12-04 MED ORDER — LIP MEDEX EX OINT
1.0000 "application " | TOPICAL_OINTMENT | Freq: Two times a day (BID) | CUTANEOUS | Status: DC
  Administered 2017-12-04: 1 via TOPICAL

## 2017-12-04 MED ORDER — GABAPENTIN 300 MG PO CAPS
300.0000 mg | ORAL_CAPSULE | Freq: Every day | ORAL | Status: DC
Start: 2017-12-04 — End: 2017-12-04

## 2017-12-04 MED ORDER — SODIUM CHLORIDE 0.9 % IV SOLN: 250.0000 mL | INTRAVENOUS | Status: DC | PRN

## 2017-12-04 MED ORDER — PSYLLIUM 95 % PO PACK
1.0000 | PACK | Freq: Every day | ORAL | Status: DC
  Administered 2017-12-04: 1 via ORAL
  Filled 2017-12-04: qty 1

## 2017-12-04 MED ORDER — OXYCODONE HCL 5 MG PO TABS: 5.0000 mg | ORAL_TABLET | Freq: Four times a day (QID) | ORAL | 0 refills | Status: DC | PRN

## 2017-12-04 MED ORDER — SODIUM CHLORIDE 0.9% FLUSH
3.0000 mL | Freq: Two times a day (BID) | INTRAVENOUS | Status: DC
  Administered 2017-12-04: 3 mL via INTRAVENOUS

## 2017-12-04 MED ORDER — ALUM & MAG HYDROXIDE-SIMETH 200-200-20 MG/5ML PO SUSP: 30.0000 mL | Freq: Four times a day (QID) | ORAL | Status: DC | PRN

## 2017-12-04 MED ORDER — MAGIC MOUTHWASH
15.0000 mL | Freq: Four times a day (QID) | ORAL | Status: DC | PRN
  Filled 2017-12-04: qty 15

## 2017-12-04 MED ORDER — CEPHALEXIN 500 MG PO CAPS: 500.0000 mg | ORAL_CAPSULE | Freq: Two times a day (BID) | ORAL | 0 refills | Status: AC

## 2017-12-04 MED ORDER — MENTHOL 3 MG MT LOZG: 1.0000 | LOZENGE | OROMUCOSAL | Status: DC | PRN

## 2017-12-04 NOTE — Progress Notes (Addendum)
Ostomy care and instructions provided by Lanny Cramp, charge nurse, prior to patient's discharge.

## 2017-12-04 NOTE — Evaluation (Signed)
Physical Therapy Evaluation Patient Details Name: Charles Coffey MRN: [id number] DOB: 12-30-1934 Today's Date: 12/04/2017   History of Present Illness  Pt has been active and in [place] with wife, until recent surgery, so wife and patient would like to have PT for proper bed mobility and mobility. Currently pt with Urothelial carcinoma with rectal stenosis, now s/p colostomy and R and L nephrostomy drain tubes as well.   Clinical Impression  Pt with some soreness and difficulties with bed mobility. Educated and performed proper body mechanics for bed mobility which patient found very helpful and minimized pain. Also walked with patient since he had not done so since surgery , and he tolerated well and educate don how to progress endurance. No further PT needs at this time.     Follow Up Recommendations No PT follow up    Equipment Recommendations  None recommended by PT    Recommendations for Other Services       Precautions / Restrictions Restrictions Weight Bearing Restrictions: No      Mobility  Bed Mobility Overal bed mobility: Needs Assistance Bed Mobility: Sidelying to Sit;Sit to Sidelying   Sidelying to sit: Supervision   Sit to supine: Supervision   General bed mobility comments: educted and cues for sidelying first before supine to sit and sit to supine to decrease abdominal pressure, minimize pain and prevent hernia's as best as possible. practiced several times with wife present as well .   Transfers Overall transfer level: Needs assistance Equipment used: 1 person hand held assist Transfers: Sit to/from Stand Sit to Stand: Min guard            Ambulation/Gait Ambulation/Gait assistance: Min guard Ambulation Distance (Feet): 120 Feet Assistive device: 1 person hand held assist       General Gait Details: tolerated well, could noticed deconditioned with some general overall weakness and shortness of breathe, nothing to be concerned about and feel will  progress as patient progressses his mobility slowly at home. Discussed with him and his wife.   Stairs            Wheelchair Mobility    Modified Rankin (Stroke Patients Only)       Balance Overall balance assessment: No apparent balance deficits (not formally assessed)                                           Pertinent Vitals/Pain Pain Assessment: 0-10 Pain Score: 2  Pain Location: occiassionaly in abdomen area or right posterior mid back area  Pain Descriptors / Indicators: Aching Pain Intervention(s): Monitored during session    Home Living Family/patient expects to be discharged to:: Private residence Living Arrangements: Spouse/significant other Available Help at Discharge: Family;Friend(s) Type of Home: House Home Access: Stairs to enter   [employer] of Steps: 1 Home Layout: Two level;Able to live on main level with bedroom/bathroom Home Equipment: None      Prior Function Level of Independence: Independent               Hand Dominance        Extremity/Trunk Assessment        Lower Extremity Assessment Lower Extremity Assessment: Overall WFL for tasks assessed       Communication   Communication: No difficulties  Cognition Arousal/Alertness: Awake/alert Behavior During Therapy: WFL for tasks assessed/performed Overall Cognitive Status: Within Functional Limits for tasks  assessed                                        General Comments      Exercises     Assessment/Plan    PT Assessment Patent does not need any further PT services  PT Problem List         PT Treatment Interventions      PT Goals (Current goals can be found in the Care Plan section)  Acute Rehab PT Goals PT Goal Formulation: All assessment and education complete, DC therapy    Frequency     Barriers to discharge        Co-evaluation               AM-PAC PT "6 Clicks" Daily Activity  Outcome  Measure Difficulty turning over in bed (including adjusting bedclothes, sheets and blankets)?: A Lot Difficulty moving from lying on back to sitting on the side of the bed? : A Lot Difficulty sitting down on and standing up from a chair with arms (e.g., wheelchair, bedside commode, etc,.)?: A Little Help needed moving to and from a bed to chair (including a wheelchair)?: A Little Help needed walking in hospital room?: A Little Help needed climbing 3-5 steps with a railing? : A Little 6 Click Score: 16    End of Session   Activity Tolerance: Patient tolerated treatment well Patient left: in bed;with call bell/phone within reach;with family/visitor present Nurse Communication: Mobility status PT Visit Diagnosis: Muscle weakness (generalized) (M62.81)    Time: 1000-1029 PT Time Calculation (min) (ACUTE ONLY): 29 min   Charges:   PT Evaluation $PT Eval Low Complexity: 1 Low PT Treatments $Therapeutic Activity: 8-22 mins   PT G Codes:        Clide Dales, PT Pager: [phone] 12/04/2017   Marialuiza Car, Gatha Mayer 12/04/2017, 1:27 PM

## 2017-12-04 NOTE — Progress Notes (Signed)
3 Days Post-Op  Subjective: Mr.  Kolbe is s/p bilateral PCN. He is doing well. Cr this morning is pending but was 1.56 yesterday. No f/c/v/n. Both PCN draining     ROS:  Review of Systems  Constitutional: Negative for fever.  Gastrointestinal: Negative for nausea and vomiting.    Anti-infectives: Anti-infectives (From admission, onward)   Start     Dose/Rate Route Frequency Ordered Stop   12/03/17 1500  vancomycin (VANCOCIN) IVPB 1000 mg/200 mL premix     1,000 mg 200 mL/hr over 60 Minutes Intravenous To Radiology 12/02/17 1341 12/03/17 1710   12/03/17 0841  vancomycin (VANCOCIN) 1-5 GM/200ML-% IVPB    Comments:  Babs Bertin   : cabinet override      12/03/17 0841 12/03/17 0845   12/02/17 2200  cefTRIAXone (ROCEPHIN) 1 g in dextrose 5 % 50 mL IVPB     1 g 100 mL/hr over 30 Minutes Intravenous Every 24 hours 12/02/17 2011     11/29/17 0000  cefTRIAXone (ROCEPHIN) 2 g in dextrose 5 % 50 mL IVPB  Status:  Discontinued     2 g 100 mL/hr over 30 Minutes Intravenous Every 24 hours 11/28/17 1921 12/01/17 1631   11/29/17 0000  vancomycin (VANCOCIN) IVPB 1000 mg/200 mL premix  Status:  Discontinued     1,000 mg 200 mL/hr over 60 Minutes Intravenous Every 24 hours 11/28/17 1934 11/30/17 1229   11/28/17 1745  cefTRIAXone (ROCEPHIN) 2 g in dextrose 5 % 50 mL IVPB  Status:  Discontinued     2 g 100 mL/hr over 30 Minutes Intravenous  Once 11/28/17 1734 11/28/17 1735   11/28/17 1745  piperacillin-tazobactam (ZOSYN) IVPB 3.375 g     3.375 g 100 mL/hr over 30 Minutes Intravenous  Once 11/28/17 1734 11/28/17 1902   11/28/17 1745  vancomycin (VANCOCIN) IVPB 1000 mg/200 mL premix     1,000 mg 200 mL/hr over 60 Minutes Intravenous  Once 11/28/17 1734 11/28/17 2044      Current Facility-Administered Medications  Medication Dose Route Frequency Provider Last Rate Last Dose  . 0.9 %  sodium chloride infusion   Intravenous Continuous Elodia Florence., MD 75 mL/hr at 12/03/17 1619    .  acetaminophen (TYLENOL) tablet 1,000 mg  1,000 mg Oral Q8H Earnstine Regal, PA-C   1,000 mg at 12/04/17 0450  . albuterol (PROVENTIL) (2.5 MG/3ML) 0.083% nebulizer solution 2.5 mg  2.5 mg Nebulization Q2H PRN Eugenie Filler, MD      . bisacodyl (DULCOLAX) suppository 10 mg  10 mg Rectal Daily PRN Eugenie Filler, MD      . carvedilol (COREG) tablet 6.25 mg  6.25 mg Oral BID WC Eugenie Filler, MD   6.25 mg at 12/03/17 0743  . cefTRIAXone (ROCEPHIN) 1 g in dextrose 5 % 50 mL IVPB  1 g Intravenous Q24H Elodia Florence., MD   Stopped at 12/03/17 2156  . famotidine (PEPCID) IVPB 20 mg premix  20 mg Intravenous Q12H Earnstine Regal, PA-C   Stopped at 12/03/17 2156  . hydrocortisone (ANUSOL-HC) suppository 25 mg  25 mg Rectal PRN Eugenie Filler, MD      . isosorbide mononitrate (IMDUR) 24 hr tablet 60 mg  60 mg Oral Daily Eugenie Filler, MD   60 mg at 12/03/17 1010  . lubiprostone (AMITIZA) capsule 24 mcg  24 mcg Oral BID WC Earnstine Regal, PA-C      . morphine 4 MG/ML injection 1-3 mg  1-3 mg  Intravenous Q2H PRN Earnstine Regal, PA-C   2 mg at 12/04/17 0451  . nitroGLYCERIN (NITROSTAT) SL tablet 0.4 mg  0.4 mg Sublingual Q5 min PRN Eugenie Filler, MD   0.4 mg at 12/01/17 0035  . ondansetron (ZOFRAN) tablet 4 mg  4 mg Oral Q6H PRN Eugenie Filler, MD       Or  . ondansetron [facility]) injection 4 mg  4 mg Intravenous Q6H PRN Eugenie Filler, MD      . oxyCODONE (Oxy IR/ROXICODONE) immediate release tablet 5-10 mg  5-10 mg Oral Q4H PRN Elodia Florence., MD   5 mg at 12/03/17 1619  . pramipexole (MIRAPEX) tablet 0.25 mg  0.25 mg Oral TID Elodia Florence., MD   0.25 mg at 12/03/17 2112  . sodium chloride flush (NS) 0.9 % injection 5 mL  5 mL Intravenous Q8H Aletta Edouard, MD   5 mL at 12/04/17 0452     Objective: Vital signs in last 24 hours: Temp:  [97.9 F (36.6 C)-98.5 F (36.9 C)] 97.9 F (36.6 C) (12/08 0511) Pulse Rate:  [59-71] 67 (12/08  0511) Resp:  [14-20] 16 (12/08 0511) BP: (96-136)/(49-92) 122/64 (12/08 0511) SpO2:  [96 %-100 %] 97 % (12/08 0511) Weight:  [90.1 kg (198 lb 10.2 oz)] 90.1 kg (198 lb 10.2 oz) (12/08 0511)  Intake/Output from previous day: 12/07 0701 - 12/08 0700 In: 20 [I.V.:10] Out: 1050 [Urine:1050] Intake/Output this shift: No intake/output data recorded.   Physical Exam  Constitutional: He is well-developed, well-nourished, and in no distress.  Vitals reviewed.   Lab Results:  Recent Labs    12/03/17 0550 12/04/17 0602  WBC 8.2 6.2  HGB 8.0* 7.8*  HCT 23.8* 23.0*  PLT 57* 54*   BMET Recent Labs    12/02/17 0611 12/03/17 0550  NA 139 138  K 4.7 4.1  CL 107 109  CO2 21* 21*  GLUCOSE 141* 97  BUN 25* 21*  CREATININE 1.74* 1.56*  CALCIUM 9.1 8.8*   PT/INR Recent Labs    12/03/17 0550  LABPROT 15.3*  INR 1.22   ABG No results for input(s): PHART, HCO3 in the last 72 hours.  Invalid input(s): PCO2, PO2  Studies/Results: Ir Nephrostomy Placement Right  Result Date: 12/03/2017 INDICATION: 82 year old male with bladder cancer and bilateral hydronephrosis. Left percutaneous nephrostomy tube was performed on 11/28/2017. Patient continues to have elevated creatinine level. Request for right percutaneous nephrostomy tube. EXAM: PLACEMENT OF RIGHT PERCUTANEOUS NEPHROSTOMY TUBE WITH ULTRASOUND AND FLUOROSCOPIC GUIDANCE COMPARISON:  None. MEDICATIONS: Vancomycin 1 g; The antibiotic was administered in an appropriate time frame prior to skin puncture. ANESTHESIA/SEDATION: Fentanyl 50 mcg IV; Versed 1.0 mg IV Moderate Sedation Time:  19 minutes The patient was continuously monitored during the procedure by the interventional radiology nurse under my direct supervision. CONTRAST:  10 mL - administered into the collecting system(s) FLUOROSCOPY TIME:  Fluoroscopy Time: 42 seconds, 11 mGy COMPLICATIONS: None immediate. PROCEDURE: Informed written consent was obtained from the patient after a  thorough discussion of the procedural risks, benefits and alternatives. All questions were addressed. Maximal Sterile Barrier Technique was utilized including caps, mask, sterile gowns, sterile gloves, sterile drape, hand hygiene and skin antiseptic. A timeout was performed prior to the initiation of the procedure. Right flank was prepped and draped in sterile fashion. Ultrasound demonstrated moderate right hydronephrosis. Skin was anesthetized with 1% lidocaine. 22 gauge needle was directed into a dilated lower pole calyx with ultrasound guidance. Wire was easily  advanced into the collecting system. Accustick dilator set was placed. Contrast injection confirmed placement in the renal collecting system. Tract was dilated to accommodate a 10.2 [place] multipurpose drain over a J wire. Drain was reconstituted in the renal pelvis. Catheter was sutured to skin and attached to a gravity bag. Fluoroscopic and ultrasound images were taken and saved for documentation. FINDINGS: Moderate right hydronephrosis. 10.2 French tube placed into the right kidney via a lower pole calyx. IMPRESSION: Successful placement of right percutaneous nephrostomy tube with ultrasound and fluoroscopic guidance. Electronically Signed   By: Markus Daft M.D.   On: 12/03/2017 10:20     Assessment and Plan:  Doing well with bilateral PCN in place, both draining without difficulty. Cr downtrending yesterday and would expect it to continue to downtrending vs stabilize. Would recommend consideration of internalization of right sided stent once Cr nadirs.     T4 Urothelial carcinoma.   The rectal biopsies were positive for infiltration of cancer in the rectal wall and Dr. Barry Dienes found abnormal tissue in the retroperitoneum suspicious for tumor spread. Biopsy revealed poorly differentiated carcinoma.    Dr. [name] has seen for consideration of chemotherapy.   If he has a dramatic response to chemo, he may become a candidate for a left  nephroureterectomy and cystectomy but that will not be anytime soon.   From a urologic stand point, OK for discharge. We can follow up as an outpatient and coordinate PCN management after follow up.   I agree with the above assessment and plan. If he does go home, we will arrange GU f/u   LOS: 6 days    Alla Feeling, MD 12/04/2017 [phone] ID: [name], male   DOB: 08/19/1935, 82 y.o.   MRN: [id number]

## 2017-12-04 NOTE — Discharge Instructions (Signed)
Ostomy Support Information ° °You’ve heard that people get along just fine with only one of their eyes, or one of their lungs, or one of their kidneys. But you also know that you have only one intestine and only one bladder, and that leaves you feeling awfully empty, both physically and emotionally: You think no other people go around without part of their intestine with the ends of their intestines sticking out through their abdominal walls.  ° °YOU ARE NOT ALONE.  There are nearly three quarters of a million people in the [place] who have an ostomy; people who have had surgery to remove all or part of their colons or bladders.  ° °There is even a national association, the [employer] with over 350 local affiliated support groups that are organized by volunteers who provide peer support and counseling. [employer] has a toll free telephone num-ber, 800-826-0826 and an educational, interactive website, www.ostomy.org  ° °An ostomy is an opening in the belly (abdominal wall) made by surgery. Ostomates are people who have had this procedure. The opening (stoma) allows the kidney or bowel to discharge waste. An external pouch covers the stoma to collect waste. Pouches are are a simple bag and are odor free. Different companies have disposable or reusable pouches to fit one's lifestyle. An ostomy can either be temporary or permanent.  ° °THERE ARE THREE MAIN TYPES OF OSTOMIES °· Colostomy. A colostomy is a surgically created opening in the large intestine (colon). °· Ileostomy. An ileostomy is a surgically created opening in the small intestine. °· Urostomy. A urostomy is a surgically created opening to divert urine away from the bladder. ° °OSTOMY Care ° °The following guidelines will make care of your colostomy easier. Keep this information close by for quick reference. ° °Helpful DIET hints °Eat a well-balanced diet including vegetables and fresh fruits. Eat on a regular schedule. Drink at least 6 to 8  glasses of fluids daily. °Eat slowly in a relaxed atmosphere. Chew your food thoroughly. Avoid chewing gum, smoking, and drinking from a straw. This will help decrease the amount of air you swallow, which may help reduce gas. °Eating yogurt or drinking buttermilk may help reduce gas. ° °To control gas at night, do not eat after 8 p.m. This will give your bowel time to quiet down before you go to bed. ° °If gas is a problem, you can purchase Beano. Sprinkle Beano on the first bite of food before eating to reduce gas. It has no flavor and should not change the taste of your food. You can buy Beano over the counter at your local drugstore. ° °Foods like fish, onions, garlic, broccoli, asparagus, and cabbage produce odor. Although your pouch is odor-proof, if you eat these foods you may notice a stronger odor when emptying your pouch. If this is a concern, you may want to limit these foods in your diet. ° °If you have an ileostomy, you will have chronic diarrhea & need to drink more liquids to avoid getting dehydrated.  Consider antidiarrheal medicine like imodium (loperamide) or Lomotil to help slow down bowel movements / diarrhea into your ileostomy bag. ° °GETTING TO GOOD BOWEL HEALTH WITH AN ILEOSTOMY °. °Irregular bowel habits such as constipation and diarrhea can lead to many problems over time.  The goal: 3-6 small BOWEL MOVEMENTS A DAY!  To have soft, regular bowel movements:  °• Drink plenty of fluids, consider 4-6 tall glasses of water a day.   ° °Controlling   diarrheA ° °o Switch to liquids and simpler foods for a few days to avoid stressing your intestines further. °o Avoid dairy products (especially milk & ice cream) for a short time.  The intestines often can lose the ability to digest lactose when stressed. °o Avoid foods that cause gassiness or bloating.  Typical foods include beans and other legumes, cabbage, broccoli, and dairy foods.  Every person has some sensitivity to other foods, so listen to our  body and avoid those foods that trigger problems for you. °o Adding fiber (Citrucel, Metamucil, psyllium, Miralax) gradually can help thicken stools by absorbing excess fluid and retrain the intestines to act more normally.  Slowly increase the dose over a few weeks.  Too much fiber too soon can backfire and cause cramping & bloating. °o Probiotics (such as active yogurt, Align, etc) may help repopulate the intestines and colon with normal bacteria and calm down a sensitive digestive tract.  Most studies show it to be of mild help, though, and such products can be costly. °o Medicines: °- Bismuth subsalicylate (ex. Kayopectate, Pepto Bismol) every 30 minutes for up to 6 doses can help control diarrhea.  Avoid if pregnant. °- Loperamide (Immodium) can slow down diarrhea.  Start with two tablets (4mg total) first and then try one tablet every 6 hours.  Avoid if you are having fevers or severe pain.  If you are not better or start feeling worse, stop all medicines and call your doctor for advice °o Call your doctor if you are getting worse or not better.  Sometimes further testing (cultures, endoscopy, X-ray studies, bloodwork, etc) may be needed to help diagnose and treat the cause of the diarrhea. ° °TROUBLESHOOTING IRREGULAR BOWELS °1) Avoid extremes of bowel movements (no bad constipation/diarrhea) °2) Miralax 17gm mixed in 8oz. water or juice-daily. May use twice a day as needed  °3) Gas-x,Phazyme, etc. as needed for gas & bloating.  °4) Soft,bland diet. No spicy,greasy,fried foods.  °5) Prilosec (omeprazole) over-the-counter as needed  °6) May hold gluten/wheat products from diet to see if symptoms improve.  °7)  May try probiotics (Align, Activa, etc) to help calm the bowels down °7) If symptoms become worse call back immediately. ° ° °Applying the pouching system °To apply your pouch, follow these steps: ° °Place all your equipment close at hand before removing your pouch. ° °Wash your hands. ° °Stand or sit in  front of a mirror. Use the position that works best for you. Remember that you must keep the skin around the stoma wrinkle-free for a good seal. ° °Gently remove the used pouch (1-piece system) or the pouch and old wafer (2-piece system). Empty the pouch into the toilet. Save the closure clip to use again. ° °Wash the stoma itself and the skin around the stoma. Your stoma may bleed a little when being washed. This is normal. Rinse and pat dry. You may use a wash cloth or soft paper towels (like Bounty), mild soap (like Dial, Safeguard, or Ivory), and water. Avoid soaps that contain perfumes or lotions. ° °For a new pouch (1-piece system) or a new wafer (2-piece system), measure your stoma using the stoma guide in each box of supplies. ° °Trace the shape of your stoma onto the back of the new pouch or the back of the new wafer. Cut out the opening. Remove the paper backing and set it aside. ° °Optional: Apply a skin barrier powder to surrounding skin if it is irritated (bare or weeping),   and dust off the excess. °Optional: Apply a skin-prep wipe (such as Skin Prep or All-Kare) to the skin around the stoma, and let it dry. Do not apply this solution if the skin is irritated (red, tender, or broken) or if you have shaved around the stoma. °Optional: Apply a skin barrier paste (such as Stomahesive, Coloplast, or Premium) around the opening cut in the back of the pouch or wafer. Allow it to dry for 30 to 60 seconds. ° °Hold the pouch (1-piece system) or wafer (2-piece system) with the sticky side toward your body. Make sure the skin around the stoma is wrinkle-free. Center the opening on the stoma, then press firmly to your abdomen (Fig. 4). Look in the mirror to check if you are placing the pouch, or wafer, in the right position. For a 2-piece system, snap the pouch onto the wafer. Make sure it snaps into place securely. ° °Place your hand over the stoma and the pouch or wafer for about 30 seconds. The heat from your  hand can help the pouch or wafer stick to your skin. ° °Add deodorant (such as Super Banish or Nullo) to your pouch. Other options include food extracts such as vanilla oil and peppermint extract. Add about 10 drops of the deodorant to the pouch. Then apply the closure clamp. Note: Do not use toxic ° chemicals or commercial cleaning agents in your pouch. These substances may harm the stoma. ° °Optional: For extra seal, apply tape to all 4 sides around the pouch or wafer, as if you were framing a picture. You may use any brand of medical adhesive tape. °Change your pouch every 5 to 7 days. Change it immediately if a leak occurs.  Wash your hands afterwards. ° °If you are wearing a 2-piece system, you may use 2 new pouches per week and alternate them. Rinse the pouch with mild soap and warm water and hang it to dry for the next day. Apply the fresh pouch. Alternate the 2 pouches like this for a week. After a week, change the wafer and begin with 2 new pouches. Place the old pouches in a plastic bag, and put them in the trash. ° ° ° °Tips for colostomy care ° °Applying Your Pouch °You may stand or sit to apply your pouch. ° °Keep the skin where you apply the pouch wrinkle-free. If the skin around the pouch is wrinkled, the seal may break when your skin stretches. ° °If hair grows close to your stoma, you may trim off the hair with scissors, an electric razor, or a safety razor. ° °Always have a mirror nearby so you can get a better view of your stoma. ° °When you apply a new pouch, write the date on the adhesive tape. This will remind you of when you last changed your pouch. ° °Changing Your Pouch °The best time to change your pouch is in the morning, before eating or drinking anything. Your stoma can function at any time, but it will function more after eating or drinking. ° °Emptying Your Pouch °Empty your pouch when it is one-third full (of urine, stool, and/or gas). If you wait until your pouch is fuller than this,  it will be more difficult to empty and more noticeable. °When you empty your pouch, either put toilet paper in the toilet bowl first, or flush the toilet while you empty the pouch. This will reduce splashing. You can empty the pouch between your legs or to one side while sitting,   or while standing or stooping. If you have a 2-piece system, you can snap off the pouch to empty it. Remember that your stoma may function during this time. °If you wish to rinse your pouch after you empty it, a turkey baster can be helpful. When using a baster, squirt water up into the pouch through the opening at the °bottom. With a 2-piece system, you can snap off the pouch to rinse it. After rinsing  your pouch, empty it into the toilet. °When rinsing your pouch at home, put a few granules of Dreft soap in the rinse water. This helps lubricate and freshen your pouch. °The inside of your pouch can be sprayed with non-stick cooking oil (Pam spray). This may help reduce stool sticking to the inside of the pouch. ° °Bathing °You may shower or bathe with your pouch on or off. Remember that your stoma may function during this time. ° °The materials you use to wash your stoma and the skin around it should be clean, but they do not need to be sterile. ° °Wearing Your Pouch °During hot weather, or if you perspire a lot in general, wear a cover over your pouch. This may prevent a rash on your skin under the pouch. Pouch covers are sold at ostomy supply stores. °Wear the pouch inside your underwear for better support. °Watch your weight. Any gain or loss of 10 to 15 pounds or more can change the way your pouch fits. ° °Going Away From Home °A collapsible cup (like those that come in travel kits) or a soft plastic squirt bottle with a pull-up top (like a travel bottle for shampoo) can be used for rinsing your pouch when you are away from home. Tilt the opening of the pouch at an upward angle when using a cup to rinse. ° °Carry wet wipes or extra  tissues to use in public bathrooms. ° °Carry an extra pouching system with you at all times. ° °Never keep ostomy supplies in the glove compartment of your car. Extreme heat or cold can damage the skin barriers and adhesive wafers on the pouch. ° °When you travel, carry your ostomy supplies with you at all times. Keep them within easy reach. Do not pack ostomy supplies in baggage that will be checked or otherwise separated from you, because your baggage might be lost. If you’re traveling out of the country, it is helpful to have a letter stating that you are carrying ostomy supplies as a medical necessity. ° °If you need ostomy supplies while traveling, look in the yellow pages of the telephone book under “Surgical Supplies.” Or call the local ostomy organization to find out where supplies are available. ° °Do not let your ostomy supplies get low. Always order new pouches before you use the last one. ° °Reducing Odor °Limit foods such as broccoli, cabbage, onions, fish, and garlic in your diet to help reduce odor. °Each time you empty your pouch, carefully clean the opening of the pouch, both inside and outside, with toilet paper. °Rinse your pouch 1 or 2 times daily after you empty it (see directions for emptying your pouch and going away from home). °Add deodorant (such as Super Banish or Nullo) to your pouch. °Use air deodorizers in your bathroom. °Do not add aspirin to your pouch. Even though aspirin can help prevent odor, it could cause ulcers on your stoma. ° °When to call the doctor °Call the doctor if you have any of the following symptoms: °Purple, black,   or white stoma Severe cramps lasting more than 6 hours Severe watery discharge from the stoma lasting more than 6 hours No output from the colostomy for 3 days Excessive bleeding from your stoma Swelling of your stoma to more than 1/2-inch larger than usual Pulling inward of your stoma below skin level Severe skin irritation or deep ulcers Bulging  or other changes in your abdomen  When to call your ostomy nurse Call your ostomy/enterostomal therapy (ET) nurse if any of the following occurs: Frequent leaking of your pouching system Change in size or appearance of your stoma, causing discomfort or problems with your pouch Skin rash or rawness Weight gain or loss that causes problems with your pouch      FREQUENTLY ASKED QUESTIONS   Why havent you met any of these folks who have an ostomy?  Well, maybe you have! You just did not recognize them because an ostomy doesn't show. It can be kept secret if you wish. Why, maybe some of your best friends, office associates or neighbors have an ostomy ... you never can tell.   People facing ostomy surgery have many quality-of-life questions like:  Will you bulge? Smell? Make noises? Will you feel waste leaving your body? Will you be a captive of the toilet? Will you starve? Be a social outcast? Get/stay married? Have babies? Easily bathe, go swimming, bend over?  OK, lets look at what you can expect:   Will you bulge?  Remember, without part of the intestine or bladder, and its contents, you should have a flatter tummy than before. You can expect to wear, with little exception, what you wore before surgery ... and this in-cludes tight clothing and bathing suits.   Will you smell?  Today, thanks to modern odor proof pouching systems, you can walk into an ostomy support group meeting and not smell anything that is foul or offensive. And, for those with an ileostomy or colostomy who are concerned about odor when emptying their pouch, there are in-pouch deodorants that can be used to eliminate any waste odors that may exist.   Will you make noises?  Everyone produces gas, especially if they are an air-swallower. But intestinal sounds that occur from time to time are no differ-ent than a gurgling tummy, and quite often your clothing will muffle any sounds.   Will you feel the waste discharges?   For those with a colostomy or ileostomy there might be a slight pressure when waste leaves your body, but understand that the intestines have no nerve endings, so there will be no unpleasant sensations. Those with a urostomy will probably be unaware of any kidney drainage.   Will you be a captive of the toilet?  Immediately post-op you will spend more time in the bathroom than you will after your body recovers from surgery. Every person is different, but on average those with an ileostomy or urostomy may empty their pouches 4 to 6 times a day; a little  less if you have a colostomy. The average wear time between pouch system changes is 3 to 5 days and the changing process should take less than 30 minutes.   Will I need to be on a special diet? Most people return to their normal diet when they have recovered from surgery. Be sure to chew your food well, eat a well-balanced diet and drink plenty of fluids. If you experience problems with a certain food, wait a couple of weeks and try it again.  Will there be  odor and noises? Pouching systems are designed to be odor-proof or odor-resistant. There are deodorants that can be used in the pouch. Medications are also available to help reduce odor. Limit gas-producing foods and carbonated beverages. You will experience less gas and fewer noises as you heal from surgery.  How much time will it take to care for my ostomy? At first, you may spend a lot of time learning about your ostomy and how to take care of it. As you become more comfortable and skilled at changing the pouching system, it will take very little time to care for it.   Will I be able to return to work? People with ostomies can perform most jobs. As soon as you have healed from surgery, you should be able to return to work. Heavy lifting (more than 10 pounds) may be discouraged.   What about intimacy? Sexual relationships and intimacy are important and fulfilling aspects of your life. They  should continue after ostomy surgery. Intimacy-related concerns should be discussed openly between you and your partner.   Can I wear regular clothing? You do not need to wear special clothing. Ostomy pouches are fairly flat and barely noticeable. Elastic undergarments will not hurt the stoma or prevent the ostomy from functioning.   Can I participate in sports? An ostomy should not limit your involvement in sports. Many people with ostomies are runners, skiers, swimmers or participate in other active lifestyles. Talk with your caregiver first before doing heavy physical activity.  Will you starve?  Not if you follow doctors orders at each stage of your post-op adjustment. There is no such thing as an ostomy diet. Some people with an ostomy will be able to eat and tolerate anything; others may find diffi-culty with some foods. Each person is an individual and must determine, by trial, what is best for them. A good practice for all is to drink plenty of water.   Will you be a social outcast?  Have you met anyone who has an ostomy and is a social outcast? Why should you be the first? Only your attitude and self image will effect how you are treated. No confi-dent person is an Occupational psychologist.     PROFESSIONAL HELP  Resources are available if you need help or have questions about your ostomy.    Specially trained nurses called Wound, Ostomy Continence Nurses (WOCN) are available for consultation in most major medical centers.   Consider getting an ostomy consult at one of the outpatient ostomy clinics.  [facility] one in [place] at this time   The [employer] ([employer]) is a group made up of many local chapters throughout the [place]. These local groups hold meetings and provide support to prospective and existing ostomates. They sponsor educational events and have qualified visitors to make personal or telephone visits. Contact the [employer] for the chapter nearest you and for other  educational publications.   More detailed information can be found in Colostomy Guide, a publication of the [employer] ([employer]OA). Contact [employer] at 1-704-169-6906 or visit their web site at [email or web address] The website contains links to other sites, suppliers and resources.   Tree surgeon Start Services:  Start at the website to enlist for support.  Your Wound Ostomy (WOCN) nurse may have started this process. https://www.hollister.com/en/securestart  Secure Start services are designed to support people as they live their lives with an ostomy or neurogenic bladder. Enrolling is easy and at no cost to the patient. We realize that  each person's needs and life journey are different. Through Secure Start services, we want to help people live their life, their way.   Indwelling Urinary Catheter Care, Adult Take good care of your catheter to keep it working and to prevent problems. How to wear your catheter Attach your catheter to your leg with tape (adhesive tape) or a leg strap. Make sure it is not too tight. If you use tape, remove any bits of tape that are already on the catheter. How to wear a drainage bag You should have:  A large overnight bag.  A small leg bag.  Overnight Bag You may wear the overnight bag at any time. Always keep the bag below the level of your bladder but off the floor. When you sleep, put a clean plastic bag in a wastebasket. Then hang the bag inside the wastebasket. Leg Bag Never wear the leg bag at night. Always wear the leg bag below your knee. Keep the leg bag secure with a leg strap or tape. How to care for your skin  Clean the skin around the catheter at least once every day.  Shower every day. Do not take baths.  Put creams, lotions, or ointments on your genital area only as told by your doctor.  Do not use powders, sprays, or lotions on your genital area. How to clean your catheter and your skin 1. Wash your hands with soap and  water. 2. Wet a washcloth in warm water and gentle (mild) soap. 3. Use the washcloth to clean the skin where the catheter enters your body. Clean downward and wipe away from the catheter in small circles. Do not wipe toward the catheter. 4. Pat the area dry with a clean towel. Make sure to clean off all soap. How to care for your drainage bags Empty your drainage bag when it is ?- full or at least 2-3 times a day. Replace your drainage bag once a month or sooner if it starts to smell bad or look dirty. Do not clean your drainage bag unless told by your doctor. Emptying a drainage bag  Supplies Needed  Rubbing alcohol.  Gauze pad or cotton ball.  Tape or a leg strap.  Steps 1. Wash your hands with soap and water. 2. Separate (detach) the bag from your leg. 3. Hold the bag over the toilet or a clean container. Keep the bag below your hips and bladder. This stops pee (urine) from going back into the tube. 4. Open the pour spout at the bottom of the bag. 5. Empty the pee into the toilet or container. Do not let the pour spout touch any surface. 6. Put rubbing alcohol on a gauze pad or cotton ball. 7. Use the gauze pad or cotton ball to clean the pour spout. 8. Close the pour spout. 9. Attach the bag to your leg with tape or a leg strap. 10. Wash your hands.  Changing a drainage bag Supplies Needed  Alcohol wipes.  A clean drainage bag.  Adhesive tape or a leg strap.  Steps 1. Wash your hands with soap and water. 2. Separate the dirty bag from your leg. 3. Pinch the rubber catheter with your fingers so that pee does not spill out. 4. Separate the catheter tube from the drainage tube where these tubes connect (at the connection valve). Do not let the tubes touch any surface. 5. Clean the end of the catheter tube with an alcohol wipe. Use a different alcohol wipe to clean the  end of the drainage tube. 6. Connect the catheter tube to the drainage tube of the clean bag. 7. Attach  the new bag to the leg with adhesive tape or a leg strap. 8. Wash your hands.  How to prevent infection and other problems  Never pull on your catheter or try to remove it. Pulling can damage tissue in your body.  Always wash your hands before and after touching your catheter.  If a leg strap gets wet, replace it with a dry one.  Drink enough fluids to keep your pee clear or pale yellow, or as told by your doctor.  Do not let the drainage bag or tubing touch the floor.  Wear cotton underwear.  If you are male, wipe from front to back after you poop (have a bowel movement).  Check on the catheter often to make sure it works and the tubing is not twisted. Get help if:  Your pee is cloudy.  Your pee smells unusually bad.  Your pee is not draining into the bag.  Your tube gets clogged.  Your catheter starts to leak.  Your bladder feels full. Get help right away if:  You have redness, swelling, or pain where the catheter enters your body.  You have fluid, pus, or a bad smell coming from the area where the catheter enters your body.  The area where the catheter enters your body feels warm.  You have a fever.  You have pain in your: ? Stomach (abdomen). ? Legs. ? Lower back. ? Bladder.  You see blood fill the catheter.  Your pee is pink or red.  You feel sick to your stomach (nauseous).  You throw up (vomit).  You have chills.  Your catheter gets pulled out. This information is not intended to replace advice given to you by your health care provider. Make sure you discuss any questions you have with your health care provider. Document Released: 04/10/2013 Document Revised: 11/11/2016 Document Reviewed: 05/29/2014 Elsevier Interactive Patient Education  [employer].

## 2017-12-04 NOTE — Care Management Note (Signed)
Case Management Note  Patient Details  Name: Charles Coffey MRN: [id number] Date of Birth: 07/24/1935  Subjective/Objective:          Pt admitted for new ostomies.  Pt to d/c with [facility] RN for ostomy care.  Pt states he is open to [facility] PT but does not feel it's necessary.  Pt states does not need equipment.         Action/Plan: Pt presented with [facility] choice via phone.  Pt chose [facility].  Pt aware RN will contact him in 24-48 hours.  Expected Discharge Date:  12/04/17               Expected Discharge Plan:  [place]  In-House Referral:  NA  Discharge planning Services  CM Consult  Post Acute Care Choice:  Home Health Choice offered to:  Patient(via phone)  DME Arranged:    DME Agency:     HH Arranged:  RN [place] Agency:  [place]  Status of Service:  In process, will continue to follow  If discussed at Long Length of Stay Meetings, dates discussed:    Additional Comments:  Arley Phenix, RN 12/04/2017, 4:14 PM

## 2017-12-04 NOTE — Progress Notes (Signed)
Referring Physician(s): Dr. Diona Fanti  Supervising Physician: Jacqulynn Cadet  Patient Status:  Charles Coffey - In-pt  Chief Complaint: T4 Urothelial carcinoma Bilateral hydronephrosis   Subjective: Feeling better.  Ready to go home.   Allergies: Antihistamines, loratadine-type and Statins  Medications: Prior to Admission medications   Medication Sig Start Date End Date Taking? Authorizing Provider  acetaminophen (TYLENOL) 500 MG tablet Take 1,000 mg by mouth every 8 (eight) hours as needed for mild pain or moderate pain.   Yes [provider]  aspirin 81 MG tablet Take 81 mg by mouth daily.     Yes [provider]  carvedilol (COREG) 6.25 MG tablet Take 1 tablet (6.25 mg total) 2 (two) times daily with a meal by mouth. 11/09/17  Yes Larey Dresser, MD  diphenhydramine-acetaminophen (TYLENOL PM EXTRA STRENGTH) 25-500 MG TABS tablet Take 1 tablet by mouth at bedtime as needed.    Yes [provider]  Hydrocortisone Acetate (HEMORRHOIDAL-HC RE) Place 1 application rectally as needed (hemorrhoids).   Yes [provider]  isosorbide mononitrate (IMDUR) 60 MG 24 hr tablet Take 1 tablet (60 mg total) by mouth daily. 10/28/17 01/26/18 Yes End, Harrell Gave, MD  lubiprostone (AMITIZA) 24 MCG capsule Take 1 capsule (24 mcg total) by mouth 2 (two) times daily with a meal. 11/23/17  Yes Pyrtle, Lajuan Lines, MD  magnesium citrate SOLN Take 1 Bottle by mouth once.   Yes [provider]  nitroGLYCERIN (NITROSTAT) 0.4 MG SL tablet Place 1 tablet (0.4 mg total) under the tongue every 5 (five) minutes as needed. Patient taking differently: Place 0.4 mg under the tongue every 5 (five) minutes as needed for chest pain.  08/19/17 11/28/17 Yes Dunn, Dayna N, PA-C  Plant Sterols and Stanols (CHOLESTOFF PO) Take 2 tablets by mouth 2 (two) times daily.   Yes [provider]  polyethylene glycol (MIRALAX / GLYCOLAX) packet Take 17 g by mouth daily as needed for  moderate constipation.   Yes [provider]  pramipexole (MIRAPEX) 1 MG tablet TAKE 1 AND 1/2 TABLETS BY MOUTH AT BEDTIME 06/29/17  Yes [name], MD  ranitidine (ZANTAC) 300 MG tablet TAKE 1 TABLET BY MOUTH TWICE A DAY 10/04/17  Yes Pyrtle, Lajuan Lines, MD  Tetrahydroz-Glyc-Hyprom-PEG (VISINE MAXIMUM REDNESS RELIEF OP) Place 2 drops into both eyes 2 (two) times daily as needed (dry eyes).    Yes [provider]  traMADol (ULTRAM) 50 MG tablet Take 50 mg by mouth every 6 (six) hours as needed for moderate pain.    Yes [provider]  cephALEXin (KEFLEX) 500 MG capsule Take 1 capsule (500 mg total) by mouth 2 (two) times daily for 3 days. 12/04/17 12/07/17  Elodia Florence., MD  linaclotide (LINZESS) 290 MCG CAPS capsule TAKE 1 CAPSULE (290 MCG TOTAL) BY MOUTH DAILY. [place] Patient not taking: Reported on 11/24/2017 03/30/17   Pyrtle, Lajuan Lines, MD  oxyCODONE (ROXICODONE) 5 MG immediate release tablet Take 1 tablet (5 mg total) by mouth every 6 (six) hours as needed. 12/04/17 12/04/18  Elodia Florence., MD     Vital Signs: BP (!) 147/59 (BP Location: Left Arm)   Pulse 61   Temp 98.5 F (36.9 C) (Oral)   Resp 18   Ht 5' 8.5" (1.74 m)   Wt 198 lb 10.2 oz (90.1 kg)   SpO2 97%   BMI 29.76 kg/m   Physical Exam  NAD, alert, sitting on side of  bed Flank:  Bilateral nephrostomy tubes in place.  Insertion sites c/d/i.  Bloody urine in right collection bag.  Clear, yellow urine from left.   Imaging: Ir Nephrostomy Placement Right  Result Date: 12/03/2017 INDICATION: 82 year old male with bladder cancer and bilateral hydronephrosis. Left percutaneous nephrostomy tube was performed on 11/28/2017. Patient continues to have elevated creatinine level. Request for right percutaneous nephrostomy tube. EXAM: PLACEMENT OF RIGHT PERCUTANEOUS NEPHROSTOMY TUBE WITH ULTRASOUND AND FLUOROSCOPIC GUIDANCE COMPARISON:  None. MEDICATIONS: Vancomycin 1 g; The  antibiotic was administered in an appropriate time frame prior to skin puncture. ANESTHESIA/SEDATION: Fentanyl 50 mcg IV; Versed 1.0 mg IV Moderate Sedation Time:  19 minutes The patient was continuously monitored during the procedure by the interventional radiology nurse under my direct supervision. CONTRAST:  10 mL - administered into the collecting system(s) FLUOROSCOPY TIME:  Fluoroscopy Time: 42 seconds, 11 mGy COMPLICATIONS: None immediate. PROCEDURE: Informed written consent was obtained from the patient after a thorough discussion of the procedural risks, benefits and alternatives. All questions were addressed. Maximal Sterile Barrier Technique was utilized including caps, mask, sterile gowns, sterile gloves, sterile drape, hand hygiene and skin antiseptic. A timeout was performed prior to the initiation of the procedure. Right flank was prepped and draped in sterile fashion. Ultrasound demonstrated moderate right hydronephrosis. Skin was anesthetized with 1% lidocaine. 22 gauge needle was directed into a dilated lower pole calyx with ultrasound guidance. Wire was easily advanced into the collecting system. Accustick dilator set was placed. Contrast injection confirmed placement in the renal collecting system. Tract was dilated to accommodate a 10.2 [place] multipurpose drain over a J wire. Drain was reconstituted in the renal pelvis. Catheter was sutured to skin and attached to a gravity bag. Fluoroscopic and ultrasound images were taken and saved for documentation. FINDINGS: Moderate right hydronephrosis. 10.2 French tube placed into the right kidney via a lower pole calyx. IMPRESSION: Successful placement of right percutaneous nephrostomy tube with ultrasound and fluoroscopic guidance. Electronically Signed   By: Markus Daft M.D.   On: 12/03/2017 10:20    Labs:  CBC: Recent Labs    12/01/17 0645 12/02/17 0611 12/03/17 0550 12/04/17 0602  WBC 6.3 8.5 8.2 6.2  HGB 9.1* 9.3* 8.0* 7.8*  HCT 26.7*  27.4* 23.8* 23.0*  PLT 61* 86* 57* 54*    COAGS: Recent Labs    08/24/17 0948 12/03/17 0550  INR 1.0 1.22    BMP: Recent Labs    12/01/17 0645 12/02/17 0611 12/03/17 0550 12/04/17 0602  NA 136 139 138 136  K 3.7 4.7 4.1 4.0  CL 107 107 109 107  CO2 21* 21* 21* 23  GLUCOSE 102* 141* 97 100*  BUN 22* 25* 21* 17  CALCIUM 8.7* 9.1 8.8* 8.5*  CREATININE 1.56* 1.74* 1.56* 1.46*  GFRNONAA 40* 35* 40* 44*  GFRAA 47* 41* 47* 51*    LIVER FUNCTION TESTS: Recent Labs    09/15/17 1420 11/28/17 1518 12/02/17 0611  BILITOT  --  1.1 0.8  AST  --  23 32  ALT  --  18 18  ALKPHOS  --  51 50  PROT 6.2 6.7 6.7  ALBUMIN  --  4.0 3.7    Assessment and Plan: Bilateral nephrostomy tube placement Patient feeling better after drain placements.  Foley has been removed; still sore.  Answered all questions.  Urology to manage.   Electronically Signed: Docia Barrier, PA 12/04/2017, 5:41 PM   I spent a total of 15 Minutes at the the patient's  bedside AND on the patient's hospital floor or unit, greater than 50% of which was counseling/coordinating care for hydronephrosis.

## 2017-12-04 NOTE — Discharge Summary (Signed)
Physician Discharge Summary  Charles Coffey JOA:[id number] DOB: [birth year] DOA: 11/28/2017  PCP: [name], MD  Admit date: 11/28/2017 Discharge date: 12/04/2017  Time spent: over 30 minutes  Recommendations for Outpatient Follow-up:  1. Follow up outpatient CMP/CBC 2. Ensure follow up with urology, oncology, and surgery 3. Urology considering internalization of R stent once creatinine nadirs 4. For anemia, thrombocytopenia, f/u with heme/onc.  Consider starting iron as outpatient.  Was briefly started on B12 here, but on review B12 normal, consider repeat B12/MMA as needed as outpatient 5. Home heath for wound ostomy ordered 6. Would arrange f/u up with cardiology as well when able given demand ischemia  Discharge Diagnoses:  Principal Problem:   Pyelonephritis Active Problems:   Hyperlipidemia LDL goal <70   RESTLESS LEGS SYNDROME   Essential hypertension   Coronary artery disease of native heart with stable angina pectoris (HCC)   GERD   Cardiomyopathy, ischemic   Chronic low back pain   PAD (peripheral artery disease) (HCC)   Anemia   Stage 3 chronic kidney disease (HCC)   Hydronephrosis due to obstructive malignant neoplasm of bladder (HCC)   Fever   Ileus (HCC)   Elevated troponin   Urothelial carcinoma of bladder (HCC)   Dehydration   Thrombocytopenia (HCC)   Low vitamin B12 level   Irritable bowel syndrome with constipation   Rectal stenosis   Colostomy in place [facility])   Discharge Condition: stable  Diet recommendation: heart healthy  Filed Weights   12/02/17 0525 12/03/17 0535 12/04/17 0511  Weight: 92.2 kg (203 lb 4.2 oz) 89.6 kg (197 lb 8.5 oz) 90.1 kg (198 lb 10.2 oz)    History of present illness:  Patient is a 82 year old gentleman history of invasive urothelial carcinoma involving left urethra and trigone status post recent cystoscopy and left retrograde pyelogram with TURBT on 11/25/2017, history of hypertension hyperlipidemia presenting to the  ED with fever as high as 101, constipation noted to have fever, ileus, moderate bilateral hydronephrosis. Patient underwent left percutaneous nephrostomy on admission. Urology following. Oncology consulted.  The patient underwent L PCN on 12/2 at admission and was started on antibiotics to treat a presumed UTI.  He was continued on ceftriaxone and discharged on keflex.  Blood and urine cx were negative.  During his stay, he had lower abdominal cramping and discomfort and GI was c/s.  He had colonoscopy 12/5 revealing rectal stenosis.  GI recommended a diverting colostomy which was performed on 12/5.  Biopsy from colonoscopy was notable for colonic mucosa with poorly differentiated carcionoma. Retroperitoneal biospy was also positive for poorly differentiated carcinoma.   Oncology followed who noted that it is unclear whether curative surgery posible and borderline candidate for cisplatin based chemotherapy (kidney function needs to be improved as he will require nephrectomy - Dr. [name] noted if curative surgery cannot be done, consider palliative chemo).  Planning to discuss in tumor board on 12/14.  He had a R nephrostomy tube placed on 12/7.  On 12/8, patient felt stable from surgical standpoint and was discharged with home health with plans for outpatient follow up with surgery, urology, oncology, and PCP.    Hospital Course:  #1 fever  Probable left-sided pyelonephritis Thought secondary to acute pyelonephritis as patient with recent instrumentation in the bladder with cystoscopy, left retrograde pyelogram and TURBT on 11/25/2017. Patient also noted to have left flank pain on presentation as well as fever and malaise. Urine cultures with insignificant and no growth.  Blood cultures NGTD x  5 days.Chest x-ray negative for any acute infiltrate.  CT abdomen and pelvis with perinephric stranding on the left and periurethral stranding as well as moderate bilateral hydronephrosis.  Pt received  ceftriaxone, discharged on keflex renally dosed.   2. Bilateral hydronephrosis/malignant obstruction of left kidney from invasive urothelial carcinoma Patient has been seen in consultation by urology. Urology has consulted interventional radiology and patient status post left percutaneous nephrostomy tube placement 11/28/2017 per Dr. Kathlene Cote.  s/p R percutaneous nephrostomy tube for 12/7  Per urology, ok to discharge  3. Rectal Stenosis  S/p Diverting colostomy - seen on colonoscopy, biopsies as above.  GI suspects invasion from bladder cancer. - Appreciate surgery c/s, now s/p diverting colostomy 12/5  4. Elevated troponin Likely demand ischemia. Patient had one episode of chest pain a day prior to admission which has since resolved. EKG with a right bundle branch block with left anterior fascicular block with no significant change from prior EKG. Patient with recent 2D echo done in September 2018 as such we will not repeat at this time. Cardiac enzymes trending down. No further workup at this time. Continue home regimen of cardiac medications. Previous provider [facility] cardiology who are in agreement.   #5 history of IBS Patient complaint of lower abdominal cramping and discomfort. Patient states has not had a bowel movement. On presentation patient noted to have an ileus was placed on bowel rest and it does seem to have resolved. Patient with positive bowel sounds and tolerating oral intake. Continue home regimen of Amitiza.  Symptoms likely due in large part to rectal stenosis.   - GI c/s as above, s/p colonoscopy on 12/5 and now s/p diverting colostomy as well  6. Chronic kidney disease stage III Improving creatinine at discharge  7. Dehydration Improved  8. Hypertension Blood pressure stable. Continue current regimen of imdurand Coreg.  9. Coronary artery disease/history of cardiomyopathy Patient noted to have elevated troponins. Enzymes were  elevated however trending down. Likely secondary to acute illness.Patient currently asymptomatic. Patient with a recent 2D echo and as such we will not repeat at this time. Continue current home oral cardiac medications. Outpatient follow-up.  #11anemia No overt bleeding. Likely secondary to anemia of chronic disease. Transfusion threshold hemoglobin less than 7. H&H has remained stable. Follow. With low iron and sat ratio, consider IV iron when able or d/c with PO iron  12. Thrombocytopenia Questionable etiology. Likely secondary to acute infection. LDH at 210.Haptoglobinat 233.Patient has been seen by hematology/oncology whorecommend monitoring at this time. Follow.   13. Urothelial carcinoma of the bladder Patient being followed by urology. Oncology has been consulted and are following along with urology. Per urology.  #14low vitamin B12 levels Started on B12 injections, but 12/3 value normal.  Will d/c B12 injections.  Can consider repeat B12/MMA as outpatient.  Penile/scrotal edema: continue to monitor after discharge, discussed with urology.   Procedures:  Perc nephrostomy left  12/2 and right 12/7  Colonoscopy 12/5  Diverting colostomy 12/5  Anti-infectives (From admission, onward)   Start     Dose/Rate Route Frequency Ordered Stop   12/04/17 0000  cephALEXin (KEFLEX) 500 MG capsule     500 mg Oral 2 times daily 12/04/17 1610 12/07/17 2359   12/03/17 1500  vancomycin (VANCOCIN) IVPB 1000 mg/200 mL premix     1,000 mg 200 mL/hr over 60 Minutes Intravenous To Radiology 12/02/17 1341 12/03/17 1710   12/03/17 0841  vancomycin (VANCOCIN) 1-5 GM/200ML-% IVPB    Comments:  Babs Bertin   :  cabinet override      12/03/17 0841 12/03/17 0845   12/02/17 2200  cefTRIAXone (ROCEPHIN) 1 g in dextrose 5 % 50 mL IVPB  Status:  Discontinued     1 g 100 mL/hr over 30 Minutes Intravenous Every 24 hours 12/02/17 2011 12/04/17 2045   11/29/17 0000  cefTRIAXone  (ROCEPHIN) 2 g in dextrose 5 % 50 mL IVPB  Status:  Discontinued     2 g 100 mL/hr over 30 Minutes Intravenous Every 24 hours 11/28/17 1921 12/01/17 1631   11/29/17 0000  vancomycin (VANCOCIN) IVPB 1000 mg/200 mL premix  Status:  Discontinued     1,000 mg 200 mL/hr over 60 Minutes Intravenous Every 24 hours 11/28/17 1934 11/30/17 1229   11/28/17 1745  cefTRIAXone (ROCEPHIN) 2 g in dextrose 5 % 50 mL IVPB  Status:  Discontinued     2 g 100 mL/hr over 30 Minutes Intravenous  Once 11/28/17 1734 11/28/17 1735   11/28/17 1745  piperacillin-tazobactam (ZOSYN) IVPB 3.375 g     3.375 g 100 mL/hr over 30 Minutes Intravenous  Once 11/28/17 1734 11/28/17 1902   11/28/17 1745  vancomycin (VANCOCIN) IVPB 1000 mg/200 mL premix     1,000 mg 200 mL/hr over 60 Minutes Intravenous  Once 11/28/17 1734 11/28/17 2044      Consultations:  IR  Urology  Oncology  GI   Surgery  Discharge Exam: Vitals:   12/04/17 1000 12/04/17 1400  BP: 113/70 (!) 147/59  Pulse: 92 61  Resp: 18 18  Temp: 98 F (36.7 C) 98.5 F (36.9 C)  SpO2: 95% 97%   Feeling ok.  Would like to go home if possible.  General: No acute distress. Cardiovascular: Heart sounds show a regular rate, and rhythm. No gallops or rubs. No murmurs. No JVD. Lungs: Clear to auscultation bilaterally with good air movement. No rales, rhonchi or wheezes. Abdomen: S/NT/ND, ostomy, bilateral nephrostomy tubes  Neurological: Alert and oriented 3. Moves all extremities 4 with equal strength. Cranial nerves II through XII grossly intact. GU: edematous scrotum/penis Skin: Warm and dry. No rashes or lesions. Extremities: No clubbing or cyanosis. No edema.  Psychiatric: Mood and affect are normal. Insight and judgment are appropriate.  Discharge Instructions   Discharge Instructions    Call MD for:  extreme fatigue   Complete by:  As directed    Call MD for:  persistant dizziness or light-headedness   Complete by:  As directed    Call MD  for:  persistant nausea and vomiting   Complete by:  As directed    Call MD for:  redness, tenderness, or signs of infection (pain, swelling, redness, odor or green/yellow discharge around incision site)   Complete by:  As directed    Call MD for:  severe uncontrolled pain   Complete by:  As directed    Call MD for:  temperature >100.4   Complete by:  As directed    Diet - low sodium heart healthy   Complete by:  As directed    Discharge instructions   Complete by:  As directed    You were seen initially for fevers and chills.  You were started on antibiotics for a potential infection of your urinary tract and you had a nephrostomy tube placed.  We will continue your antibiotics for another 3 days.  You had a colostomy performed due to the rectal stenosis because of the cancer and ultimately a 2nd nephrostomy tube was placed.  Please follow up with  surgery.  We will arrange for home health to assist with the ostomy care.  Please follow up with urology and oncology as well.  Follow up with your PCP, please ask them to request records so they know what was done up until this point.  Return if you have new, worsening, or recurrent symptoms.  I'm sending you home with antibiotics and oxycodone as needed.  Please do not take your tramadol if you take the oxycodone.   Increase activity slowly   Complete by:  As directed      Allergies as of 12/04/2017      Reactions   Antihistamines, Loratadine-type Other (See Comments)   Unable to urinate   Statins Other (See Comments)   liver effects      Medication List    STOP taking these medications   aspirin 81 MG tablet     TAKE these medications   acetaminophen 500 MG tablet Commonly known as:  TYLENOL Take 1,000 mg by mouth every 8 (eight) hours as needed for mild pain or moderate pain.   carvedilol 6.25 MG tablet Commonly known as:  COREG Take 1 tablet (6.25 mg total) 2 (two) times daily with a meal by mouth.   cephALEXin 500 MG  capsule Commonly known as:  KEFLEX Take 1 capsule (500 mg total) by mouth 2 (two) times daily for 3 days.   CHOLESTOFF PO Take 2 tablets by mouth 2 (two) times daily.   HEMORRHOIDAL-HC RE Place 1 application rectally as needed (hemorrhoids).   isosorbide mononitrate 60 MG 24 hr tablet Commonly known as:  IMDUR Take 1 tablet (60 mg total) by mouth daily.   linaclotide 290 MCG Caps capsule Commonly known as:  LINZESS TAKE 1 CAPSULE (290 MCG TOTAL) BY MOUTH DAILY. 30 MINUTES PRIOR TO A MEAL   lubiprostone 24 MCG capsule Commonly known as:  AMITIZA Take 1 capsule (24 mcg total) by mouth 2 (two) times daily with a meal.   magnesium citrate Soln Take 1 Bottle by mouth once.   nitroGLYCERIN 0.4 MG SL tablet Commonly known as:  NITROSTAT Place 1 tablet (0.4 mg total) under the tongue every 5 (five) minutes as needed. What changed:  reasons to take this   oxyCODONE 5 MG immediate release tablet Commonly known as:  ROXICODONE Take 1 tablet (5 mg total) by mouth every 6 (six) hours as needed.   polyethylene glycol packet Commonly known as:  MIRALAX / GLYCOLAX Take 17 g by mouth daily as needed for moderate constipation.   pramipexole 1 MG tablet Commonly known as:  MIRAPEX TAKE 1 AND 1/2 TABLETS BY MOUTH AT BEDTIME   ranitidine 300 MG tablet Commonly known as:  ZANTAC TAKE 1 TABLET BY MOUTH TWICE A DAY   traMADol 50 MG tablet Commonly known as:  ULTRAM Take 50 mg by mouth every 6 (six) hours as needed for moderate pain.   TYLENOL PM EXTRA STRENGTH 25-500 MG Tabs tablet Generic drug:  diphenhydramine-acetaminophen Take 1 tablet by mouth at bedtime as needed.   VISINE MAXIMUM REDNESS RELIEF OP Place 2 drops into both eyes 2 (two) times daily as needed (dry eyes).      Allergies  Allergen Reactions  . Antihistamines, Loratadine-Type Other (See Comments)    Unable to urinate  . Statins Other (See Comments)    liver effects   Follow-up Information    Stark Klein,  MD Follow up.   Specialty:  General Surgery Why:  Call for follow up appointment in 2-3  weeks Contact information: [address] [place] [address] [phone]        Irine Seal, MD Follow up.   Specialty:  Urology Why:  we will call to arrange followup Contact information: [place] [address] [phone]        Wyatt Portela, MD Follow up.   Specialty:  Oncology Contact information: [place] [address] [phone]        [name], MD Follow up.   Specialty:  Family Medicine Contact information: [place] [address] [phone]        [facility] Follow up.   Specialty:  Home Health Services Why:  RN will call you to set up visit in 24-48 hours. Contact information: [address] [place] [address] [phone]            The results of significant diagnostics from this hospitalization (including imaging, microbiology, ancillary and laboratory) are listed below for reference.    Significant Diagnostic Studies: Ct Abdomen Pelvis Wo Contrast  Result Date: 11/28/2017 CLINICAL DATA:  Family reports pt had renal and bladder biopsies on Thursday or Friday. Family reports pt has had a fever at home, nausea, dysuria, and AMS. Denies any antipyretic use today. [name]. EXAM: CT ABDOMEN AND PELVIS WITHOUT CONTRAST TECHNIQUE: Multidetector CT imaging of the abdomen and pelvis was performed following the standard protocol without IV contrast. COMPARISON:  11/11/2017 FINDINGS: Lower chest: Small right and minimal left pleural effusions. Pleural effusions are new since the prior CT. Mild interstitial thickening at the lung bases as well as subsegmental atelectasis, greater on the right. Interstitial thickening is similar to the prior CT. No convincing pneumonia. Hepatobiliary: No focal liver abnormality is seen. No  gallstones, gallbladder wall thickening, or biliary dilatation. Pancreas: Unremarkable. No pancreatic ductal dilatation or surrounding inflammatory changes. Spleen: Normal in size without focal abnormality. Adrenals/Urinary Tract: Residual contrast fills the moderately dilated left intrarenal collecting system and portions of the left ureter. There is mild dilation of the right intrarenal collecting system and portions of the right ureter. There is diffuse left renal cortical thinning. No renal masses. No visualized stones. No adrenal masses. Irregular thickening along the posterior aspect of the bladder, greater on the left, is similar to the prior CT. Stomach/Bowel: Stomach is unremarkable. The small bowel and colon show air-fluid levels, but are nondilated with no wall thickening or adjacent inflammation. This is consistent with a mild diffuse adynamic ileus. No evidence of obstruction. Mild colonic stool burden. Appendix not visualized. Vascular/Lymphatic: Aortic atherosclerosis. No enlarged abdominal or pelvic lymph nodes. Reproductive: Prostate gland is enlarged this, bulging against the posterior inferior bladder base, stable from the recent CT. Other: No abdominal wall hernia or abnormality. No abdominopelvic ascites. Musculoskeletal: No fracture or acute finding. No osteoblastic or osteolytic lesions. Stable changes from a previous L3-L4 posterior lumbar spine fusion. Chronic bilateral pars defects with a grade 1 anterolisthesis at L5-S1. IMPRESSION: 1. Moderate left hydronephrosis. There is residual contrast filling of the left intrarenal collecting system and portions of the left ureter. This is consistent with moderate obstructive uropathy, which appears due to an area of irregular bladder wall thickening or mass crossing the left ureteral trigone. 2. Mild right hydronephrosis. Right hydronephrosis has increased from the previous CT scan. Left hydronephrosis is unchanged. There is left perinephric and  peri ureteral stranding, which is similar to the prior exam  which may be the result of the obstruction only, but infection should also be considered. 3. Small right and minimal left pleural effusions. Mild lung base interstitial thickening. Consider mild congestive heart failure given the symptoms of shortness of breath. Electronically Signed   By: Lajean Manes M.D.   On: 11/28/2017 16:18   Dg Chest Port 1 View  Result Date: 11/28/2017 CLINICAL DATA:  Per order- SOBHis chronic constipation has worsened over the past several days. He has also felt very poorly, unable to get out of bed today and low-grade fevers. Pt states Sob today. EXAM: PORTABLE CHEST 1 VIEW COMPARISON:  None. FINDINGS: There changes from prior cardiac surgery. The cardiac silhouette is normal in size and configuration. No mediastinal or hilar masses. No convincing adenopathy. Clear lungs.  No pleural effusion or pneumothorax. Skeletal structures are intact. IMPRESSION: No active disease. Electronically Signed   By: Lajean Manes M.D.   On: 11/28/2017 15:59   Dg Abd 2 Views  Result Date: 11/30/2017 CLINICAL DATA:  Abdominal bloating, rectal pain with bowel movements. EXAM: ABDOMEN - 2 VIEW COMPARISON:  Abdominal radiographs of November 29, 2017 FINDINGS: There remain multiple air in fluid levels within loops of colon in the mid and upper abdomen bilaterally. There are few small air-fluid levels in the mid abdomen. No significant rectal gas is observed. No free extraluminal gas collections are demonstrated. A left nephrostomy tube appears stable. There are postsurgical changes in the lower lumbar spine. There is stable moderate dextrocurvature centered at L2-3. IMPRESSION: There has been distal migration of small bowel gas into the colon. No evidence of obstruction is evident. There is no significant rectal gas however. There is no evidence of perforation. Electronically Signed   By: David  Martinique M.D.   On: 11/30/2017 14:49   Acute  Abdominal Series  Result Date: 11/29/2017 CLINICAL DATA:  Initial evaluation for constipation for 1 week. EXAM: DG ABDOMEN ACUTE W/ 1V CHEST COMPARISON:  Prior CT from 11/28/2017. FINDINGS: Median sternotomy wires underlying cardiomegaly. Mediastinal silhouette normal. Lungs hypoinflated. Mild diffuse pulmonary interstitial congestion without frank alveolar edema. No other focal infiltrates. No pleural effusion. No pneumothorax. Few scattered gas-filled loops of bowel seen throughout the abdomen without evidence for obstruction or ileus. No free air seen on lateral decubitus view. No significant bowel wall thickening. Overall stool burden is relatively mild. Soft tissue mass or abnormal calcification. Left-sided nephrostomy tube noted. Dextroscoliosis with postsurgical changes noted within the lower lumbar spine. IMPRESSION: 1. Nonobstructive bowel gas pattern with no radiographic evidence for acute intra-abdominal pathology. Overall stool burden is mild. 2. Percutaneous nephrostomy tube in place. 3. Stable cardiomegaly with mild diffuse pulmonary interstitial congestion without frank pulmonary edema. Electronically Signed   By: Jeannine Boga M.D.   On: 11/29/2017 01:00   Dg C-arm 1-60 Min-no Report  Result Date: 11/25/2017 Fluoroscopy was utilized by the requesting physician.  No radiographic interpretation.   Ir Nephrostomy Placement Left  Result Date: 11/30/2017 CLINICAL DATA:  Left ureteral obstruction and severe hydronephrosis secondary to uroepithelial carcinoma involving the distal ureter and bladder trigone. Status post retrograde urography on 11/25/2017. At that time, contrast injection was performed but a ureteral stent could not be advanced to the level of the kidney. He now presents with increasing flank and back pain as well as fever with evidence of developing urosepsis. By CT, there is retained contrast material in the left renal collecting system that demonstrates significant  hydronephrosis. EXAM: LEFT PERCUTANEOUS NEPHROSTOMY TUBE PLACEMENT. COMPARISON:  CT of  the abdomen on 11/28/2017 and imaging from retrograde urography study on 11/25/2017 ANESTHESIA/SEDATION: 1.0 mg IV Versed; 50 mcg IV Fentanyl. Total Moderate Sedation Time 6 minutes. The patient's level of consciousness and physiologic status were continuously monitored during the procedure by Radiology nursing. CONTRAST:  15 mL [id number]-300 injected into the renal collecting system. MEDICATIONS: IV vancomycin was being infused at the time of the procedure. FLUOROSCOPY TIME:  1 minute and 42 seconds.  72 mGy. PROCEDURE: The procedure, risks, benefits, and alternatives were explained to the patient. Questions regarding the procedure were encouraged and answered. The patient understands and consents to the procedure. A time-out was performed prior to initiating the procedure. The left flank region was prepped with chlorhexidine in a sterile fashion, and a sterile drape was applied covering the operative field. A sterile gown and sterile gloves were used for the procedure. Local anesthesia was provided with 1% Lidocaine. Under fluoroscopy, a 21 gauge needle was advanced into the renal collecting system via a posterior lower pole calyx. Aspiration of urine sample was performed followed by contrast injection. A guidewire was advanced. A transitional dilator was advanced over the guidewire. Percutaneous tract dilatation was then performed over the guidewire. A 10 -French percutaneous nephrostomy tube was then advanced and formed in the collecting system. Catheter position was confirmed by fluoroscopy after contrast injection. The catheter was secured at the skin with a Prolene retention suture and Stat-Lock device. A urine sample was sent for culture analysis. A gravity bag was placed. COMPLICATIONS: None. FINDINGS: Severe hydronephrosis is present with retained contrast material in the left renal collecting system. After access of the  lower pole, a nephrostomy tube was eventually placed and advanced into the renal pelvis. IMPRESSION: Placement of 10 French left percutaneous nephrostomy tube. A urine sample was sent for culture analysis. Electronically Signed   By: Aletta Edouard M.D.   On: 11/30/2017 09:20   Ir Nephrostomy Placement Right  Result Date: 12/03/2017 INDICATION: 82 year old male with bladder cancer and bilateral hydronephrosis. Left percutaneous nephrostomy tube was performed on 11/28/2017. Patient continues to have elevated creatinine level. Request for right percutaneous nephrostomy tube. EXAM: PLACEMENT OF RIGHT PERCUTANEOUS NEPHROSTOMY TUBE WITH ULTRASOUND AND FLUOROSCOPIC GUIDANCE COMPARISON:  None. MEDICATIONS: Vancomycin 1 g; The antibiotic was administered in an appropriate time frame prior to skin puncture. ANESTHESIA/SEDATION: Fentanyl 50 mcg IV; Versed 1.0 mg IV Moderate Sedation Time:  19 minutes The patient was continuously monitored during the procedure by the interventional radiology nurse under my direct supervision. CONTRAST:  10 mL - administered into the collecting system(s) FLUOROSCOPY TIME:  Fluoroscopy Time: 42 seconds, 11 mGy COMPLICATIONS: None immediate. PROCEDURE: Informed written consent was obtained from the patient after a thorough discussion of the procedural risks, benefits and alternatives. All questions were addressed. Maximal Sterile Barrier Technique was utilized including caps, mask, sterile gowns, sterile gloves, sterile drape, hand hygiene and skin antiseptic. A timeout was performed prior to the initiation of the procedure. Right flank was prepped and draped in sterile fashion. Ultrasound demonstrated moderate right hydronephrosis. Skin was anesthetized with 1% lidocaine. 22 gauge needle was directed into a dilated lower pole calyx with ultrasound guidance. Wire was easily advanced into the collecting system. Accustick dilator set was placed. Contrast injection confirmed placement in the  renal collecting system. Tract was dilated to accommodate a 10.2 [place] multipurpose drain over a J wire. Drain was reconstituted in the renal pelvis. Catheter was sutured to skin and attached to a gravity bag. Fluoroscopic and ultrasound images were taken  and saved for documentation. FINDINGS: Moderate right hydronephrosis. 10.2 French tube placed into the right kidney via a lower pole calyx. IMPRESSION: Successful placement of right percutaneous nephrostomy tube with ultrasound and fluoroscopic guidance. Electronically Signed   By: Markus Daft M.D.   On: 12/03/2017 10:20    Microbiology: Recent Results (from the past 240 hour(s))  Culture, blood (Routine X 2) w Reflex to ID Panel     Status: None   Collection Time: 11/28/17  3:18 PM  Result Value Ref Range Status   Specimen Description BLOOD BLOOD RIGHT HAND  Final   Special Requests   Final    BOTTLES DRAWN AEROBIC AND ANAEROBIC Blood Culture adequate volume   Culture   Final    NO GROWTH 5 DAYS Performed at [facility], [address], [place], [place] [address]    Report Status 12/03/2017 FINAL  Final  Culture, blood (Routine X 2) w Reflex to ID Panel     Status: None   Collection Time: 11/28/17  3:23 PM  Result Value Ref Range Status   Specimen Description BLOOD BLOOD LEFT FOREARM  Final   Special Requests   Final    BOTTLES DRAWN AEROBIC AND ANAEROBIC Blood Culture adequate volume   Culture   Final    NO GROWTH 5 DAYS Performed at [facility], [place] [address] [place], [place] [address]    Report Status 12/03/2017 FINAL  Final  Culture, Urine     Status: Abnormal   Collection Time: 11/28/17  6:18 PM  Result Value Ref Range Status   Specimen Description URINE, CLEAN CATCH  Final   Special Requests NONE  Final   Culture (A)  Final    <10,000 COLONIES/mL INSIGNIFICANT GROWTH Performed at [facility], [place] [address], [place], [place] [address]    Report Status 11/30/2017 FINAL  Final  Urine culture      Status: None   Collection Time: 11/28/17  8:18 PM  Result Value Ref Range Status   Specimen Description KIDNEY  Final   Special Requests   Final    ASPIRATE OF URINE FROM LEFT RENAL COLLECTING SYSTEM   Culture   Final    NO GROWTH Performed at [facility], [place] [address], [place], [place] [address]    Report Status 11/30/2017 FINAL  Final     Labs: Basic Metabolic Panel: Recent Labs  Lab 11/29/17 0540 11/29/17 0820 11/30/17 0555 12/01/17 0645 12/02/17 0611 12/03/17 0550 12/04/17 0602  NA 134*  --  132* 136 139 138 136  K 4.4  --  4.3 3.7 4.7 4.1 4.0  CL 105  --  103 107 107 109 107  CO2 21*  --  22 21* 21* 21* 23  GLUCOSE 98  --  114* 102* 141* 97 100*  BUN 26*  --  24* 22* 25* 21* 17  CREATININE 1.55*  --  1.49* 1.56* 1.74* 1.56* 1.46*  CALCIUM 8.5*  --  9.0 8.7* 9.1 8.8* 8.5*  MG 2.3 2.7*  --   --   --   --   --    Liver Function Tests: Recent Labs  Lab 11/28/17 1518 12/02/17 0611  AST 23 32  ALT 18 18  ALKPHOS 51 50  BILITOT 1.1 0.8  PROT 6.7 6.7  ALBUMIN 4.0 3.7   No results for input(s): LIPASE, AMYLASE in the last 168 hours. No results for input(s): AMMONIA in the last 168 hours. CBC: Recent  Labs  Lab 11/28/17 1518  11/30/17 0555 12/01/17 0645 12/02/17 0611 12/03/17 0550 12/04/17 0602  WBC 9.9   < > 6.8 6.3 8.5 8.2 6.2  NEUTROABS 6.9  --   --  3.8  --   --  4.2  HGB 9.4*   < > 9.2* 9.1* 9.3* 8.0* 7.8*  HCT 27.5*   < > 27.1* 26.7* 27.4* 23.8* 23.0*  MCV 91.4   < > 90.3 90.2 91.3 92.2 93.1  PLT 72*   < > 60* 61* 86* 57* 54*   < > = values in this interval not displayed.   Cardiac Enzymes: Recent Labs  Lab 11/28/17 1922 11/29/17 0112 11/29/17 0710  TROPONINI 0.20* 0.17* 0.13*   BNP: BNP (last 3 results) No results for input(s): BNP in the last 8760 hours.  ProBNP (last 3 results) No results for input(s): PROBNP in the last 8760 hours.  CBG: Recent Labs  Lab 11/30/17 0751 12/01/17 0741 12/02/17 0753 12/03/17 0812  12/04/17 0748  GLUCAP 107* 103* 122* 98 99       Signed:  Fayrene Helper MD.  [facility] 12/04/2017, 9:21 PM

## 2017-12-04 NOTE — Progress Notes (Signed)
[address] [place], [place] [address] Phone: [phone]  FAX: [phone]      DAEVION NAVARETTE [id number] 11/13/1935  CARE TEAM:  PCP: [name], MD  Outpatient Care Team: Patient Care Team: [name], MD as PCP - Sheran Luz, MD as Consulting Physician (Neurosurgery)  Inpatient Treatment Team: Treatment Team: Attending Provider: Elodia Florence., MD; Radiologist: Dorthy [facility], MD; Consulting Physician: Irine Seal, MD; Registered Nurse: Bobby Rumpf, RN; Consulting Physician: Wyatt Portela, MD; Rounding Team: Ian Bushman, MD; Consulting Physician: Nolon Nations, MD; Registered Nurse: Fransisca Connors, RN; Registered Nurse: Virginia Rochester, RN; Technician: Virgia Land, [place]; Physical Therapist: Ernesta Amble, PT   Problem List:   Principal Problem:   Fever Active Problems:   Hyperlipidemia LDL goal <70   RESTLESS LEGS SYNDROME   Essential hypertension   Coronary artery disease of native heart with stable angina pectoris (HCC)   GERD   Cardiomyopathy, ischemic   Chronic low back pain   PAD (peripheral artery disease) (HCC)   Anemia   Stage 3 chronic kidney disease (HCC)   Hydronephrosis due to obstructive malignant neoplasm of bladder (HCC)   Pyelonephritis   Ileus (HCC)   Elevated troponin   Urothelial carcinoma of bladder (HCC)   Dehydration   Thrombocytopenia (HCC)   Low vitamin B12 level   Irritable bowel syndrome with constipation   Rectal stenosis   3 Days Post-Op  12/01/2017  Procedure(s): LAPAROSCOPIC DIVERTING OSTOMY   Assessment  Recovering  Plan:  -colostomy care - seen by WOCN by wife anxious to meet again.  Arrange HH care -foley & perc drains per urology -VTE prophylaxis- SCDs, etc -mobilize as tolerated to help recovery  30 minutes spent in review, evaluation, examination, counseling, and coordination of care.  More than  50% of that time was spent in counseling.  Adin Hector, M.D., F.A.C.S. Gastrointestinal and Minimally Invasive Surgery [facility] [address] [place], [place] [address] [phone] Main / Paging   12/04/2017    Subjective: (Chief complaint)  Restless to go home But worried about ostomy, foley & drain care  Objective:  Vital signs:  Vitals:   12/03/17 1600 12/03/17 2020 12/04/17 0018 12/04/17 0511  BP: (!) 113/53 102/70 125/65 122/64  Pulse: 64 68 60 67  Resp:  '18 15 16  '$ Temp:  97.9 F (36.6 C) 98.5 F (36.9 C) 97.9 F (36.6 C)  TempSrc:  Oral Oral Oral  SpO2:  98% 96% 97%  Weight:    90.1 kg (198 lb 10.2 oz)  Height:        Last BM Date: 12/03/17  Intake/Output   Yesterday:  12/07 0701 - 12/08 0700 In: 20 [I.V.:10] Out: 1050 [Urine:1050] This shift:  No intake/output data recorded.  Bowel function:  Flatus: YES  BM:  YES  Drain: urine    Physical Exam:  General: Pt awake/alert/oriented x4 in no acute distress Eyes: PERRL, normal EOM.  Sclera clear.  No icterus Neuro: CN II-XII intact w/o focal sensory/motor deficits. Lymph: No head/neck/groin lymphadenopathy Psych:  No delerium/psychosis/paranoia HENT: Normocephalic, Mucus membranes moist.  No thrush Neck: Supple, No tracheal deviation Chest:  No chest wall pain w good excursion CV:  Pulses intact.  Regular rhythm MS: Normal AROM mjr joints.  No obvious deformity  Abdomen: Soft.  Nondistended.  Mildly tender at incisions only.  LLQ colostomy pink w gas.  No evidence of peritonitis.  No incarcerated hernias.  GU: Foley clear yellow urine Ext:   No deformity.  No mjr edema.  No cyanosis Skin: No petechiae / purpura  Results:   Labs: Results for orders placed or performed during the hospital encounter of 11/28/17 (from the past 48 hour(s))  Basic metabolic panel     Status: Abnormal   Collection Time: 12/03/17  5:50 AM  Result Value Ref Range    Sodium 138 135 - 145 mmol/L   Potassium 4.1 3.5 - 5.1 mmol/L   Chloride 109 101 - 111 mmol/L   CO2 21 (L) 22 - 32 mmol/L   Glucose, Bld 97 65 - 99 mg/dL   BUN 21 (H) 6 - 20 mg/dL   Creatinine, Ser 1.56 (H) 0.61 - 1.24 mg/dL   Calcium 8.8 (L) 8.9 - 10.3 mg/dL   GFR calc non Af Amer 40 (L) >60 mL/min   GFR calc Af Amer 47 (L) >60 mL/min    Comment: (NOTE) The eGFR has been calculated using the CKD EPI equation. This calculation has not been validated in all clinical situations. eGFR's persistently <60 mL/min signify possible Chronic Kidney Disease.    Anion gap 8 5 - 15  CBC     Status: Abnormal   Collection Time: 12/03/17  5:50 AM  Result Value Ref Range   WBC 8.2 4.0 - 10.5 K/uL   RBC 2.58 (L) 4.22 - 5.81 MIL/uL   Hemoglobin 8.0 (L) 13.0 - 17.0 g/dL   HCT 23.8 (L) 39.0 - 52.0 %   MCV 92.2 78.0 - 100.0 fL   MCH 31.0 26.0 - 34.0 pg   MCHC 33.6 30.0 - 36.0 g/dL   RDW 16.2 (H) 11.5 - 15.5 %   Platelets 57 (L) 150 - 400 K/uL    Comment: REPEATED TO VERIFY SPECIMEN CHECKED FOR CLOTS PLATELET COUNT CONFIRMED BY SMEAR   Protime-INR     Status: Abnormal   Collection Time: 12/03/17  5:50 AM  Result Value Ref Range   Prothrombin Time 15.3 (H) 11.4 - 15.2 seconds   INR 1.22   Glucose, capillary     Status: None   Collection Time: 12/03/17  8:12 AM  Result Value Ref Range   Glucose-Capillary 98 65 - 99 mg/dL  CBC with Differential/Platelet     Status: Abnormal   Collection Time: 12/04/17  6:02 AM  Result Value Ref Range   WBC 6.2 4.0 - 10.5 K/uL   RBC 2.47 (L) 4.22 - 5.81 MIL/uL   Hemoglobin 7.8 (L) 13.0 - 17.0 g/dL   HCT 23.0 (L) 39.0 - 52.0 %   MCV 93.1 78.0 - 100.0 fL   MCH 31.6 26.0 - 34.0 pg   MCHC 33.9 30.0 - 36.0 g/dL   RDW 16.7 (H) 11.5 - 15.5 %   Platelets 54 (L) 150 - 400 K/uL    Comment: REPEATED TO VERIFY CONSISTENT WITH PREVIOUS RESULT    Neutrophils Relative % 69 %   Neutro Abs 4.2 1.7 - 7.7 K/uL   Lymphocytes Relative 21 %   Lymphs Abs 1.3 0.7 - 4.0 K/uL    Monocytes Relative 9 %   Monocytes Absolute 0.6 0.1 - 1.0 K/uL   Eosinophils Relative 1 %   Eosinophils Absolute 0.1 0.0 - 0.7 K/uL   Basophils Relative 0 %   Basophils Absolute 0.0 0.0 - 0.1 K/uL  Basic metabolic panel     Status: Abnormal   Collection Time: 12/04/17  6:02 AM  Result Value Ref Range   Sodium 136 135 - 145 mmol/L   Potassium 4.0 3.5 - 5.1 mmol/L   Chloride 107 101 - 111 mmol/L   CO2 23 22 - 32 mmol/L   Glucose, Bld 100 (H) 65 - 99 mg/dL   BUN 17 6 - 20 mg/dL   Creatinine, Ser 1.46 (H) 0.61 - 1.24 mg/dL   Calcium 8.5 (L) 8.9 - 10.3 mg/dL   GFR calc non Af Amer 44 (L) >60 mL/min   GFR calc Af Amer 51 (L) >60 mL/min    Comment: (NOTE) The eGFR has been calculated using the CKD EPI equation. This calculation has not been validated in all clinical situations. eGFR's persistently <60 mL/min signify possible Chronic Kidney Disease.    Anion gap 6 5 - 15  Glucose, capillary     Status: None   Collection Time: 12/04/17  7:48 AM  Result Value Ref Range   Glucose-Capillary 99 65 - 99 mg/dL    Imaging / Studies: Ir Nephrostomy Placement Right  Result Date: 12/03/2017 INDICATION: 82 year old male with bladder cancer and bilateral hydronephrosis. Left percutaneous nephrostomy tube was performed on 11/28/2017. Patient continues to have elevated creatinine level. Request for right percutaneous nephrostomy tube. EXAM: PLACEMENT OF RIGHT PERCUTANEOUS NEPHROSTOMY TUBE WITH ULTRASOUND AND FLUOROSCOPIC GUIDANCE COMPARISON:  None. MEDICATIONS: Vancomycin 1 g; The antibiotic was administered in an appropriate time frame prior to skin puncture. ANESTHESIA/SEDATION: Fentanyl 50 mcg IV; Versed 1.0 mg IV Moderate Sedation Time:  19 minutes The patient was continuously monitored during the procedure by the interventional radiology nurse under my direct supervision. CONTRAST:  10 mL - administered into the collecting system(s) FLUOROSCOPY TIME:  Fluoroscopy Time: 42 seconds, 11 mGy  COMPLICATIONS: None immediate. PROCEDURE: Informed written consent was obtained from the patient after a thorough discussion of the procedural risks, benefits and alternatives. All questions were addressed. Maximal Sterile Barrier Technique was utilized including caps, mask, sterile gowns, sterile gloves, sterile drape, hand hygiene and skin antiseptic. A timeout was performed prior to the initiation of the procedure. Right flank was prepped and draped in sterile fashion. Ultrasound demonstrated moderate right hydronephrosis. Skin was anesthetized with 1% lidocaine. 22 gauge needle was directed into a dilated lower pole calyx with ultrasound guidance. Wire was easily advanced into the collecting system. Accustick dilator set was placed. Contrast injection confirmed placement in the renal collecting system. Tract was dilated to accommodate a 10.2 [place] multipurpose drain over a J wire. Drain was reconstituted in the renal pelvis. Catheter was sutured to skin and attached to a gravity bag. Fluoroscopic and ultrasound images were taken and saved for documentation. FINDINGS: Moderate right hydronephrosis. 10.2 French tube placed into the right kidney via a lower pole calyx. IMPRESSION: Successful placement of right percutaneous nephrostomy tube with ultrasound and fluoroscopic guidance. Electronically Signed   By: Markus Daft M.D.   On: 12/03/2017 10:20    Medications / Allergies: per chart  Antibiotics: Anti-infectives (From admission, onward)   Start     Dose/Rate Route Frequency Ordered Stop   12/03/17 1500  vancomycin (VANCOCIN) IVPB 1000 mg/200 mL premix     1,000 mg 200 mL/hr over 60 Minutes Intravenous To Radiology 12/02/17 1341 12/03/17 1710   12/03/17 0841  vancomycin (VANCOCIN) 1-5 GM/200ML-% IVPB    Comments:  Babs Bertin   : cabinet override      12/03/17 0841 12/03/17 0845   12/02/17 2200  cefTRIAXone (ROCEPHIN) 1 g in dextrose 5 % 50 mL IVPB  1 g 100 mL/hr over 30 Minutes Intravenous  Every 24 hours 12/02/17 2011     11/29/17 0000  cefTRIAXone (ROCEPHIN) 2 g in dextrose 5 % 50 mL IVPB  Status:  Discontinued     2 g 100 mL/hr over 30 Minutes Intravenous Every 24 hours 11/28/17 1921 12/01/17 1631   11/29/17 0000  vancomycin (VANCOCIN) IVPB 1000 mg/200 mL premix  Status:  Discontinued     1,000 mg 200 mL/hr over 60 Minutes Intravenous Every 24 hours 11/28/17 1934 11/30/17 1229   11/28/17 1745  cefTRIAXone (ROCEPHIN) 2 g in dextrose 5 % 50 mL IVPB  Status:  Discontinued     2 g 100 mL/hr over 30 Minutes Intravenous  Once 11/28/17 1734 11/28/17 1735   11/28/17 1745  piperacillin-tazobactam (ZOSYN) IVPB 3.375 g     3.375 g 100 mL/hr over 30 Minutes Intravenous  Once 11/28/17 1734 11/28/17 1902   11/28/17 1745  vancomycin (VANCOCIN) IVPB 1000 mg/200 mL premix     1,000 mg 200 mL/hr over 60 Minutes Intravenous  Once 11/28/17 1734 11/28/17 2044        Note: Portions of this report may have been transcribed using voice recognition software. Every effort was made to ensure accuracy; however, inadvertent computerized transcription errors may be present.   Any transcriptional errors that result from this process are unintentional.     Adin Hector, M.D., F.A.C.S. Gastrointestinal and Minimally Invasive Surgery [facility] [address], [place],  [address] [phone] Main / Paging   12/04/2017

## 2017-12-06 ENCOUNTER — Telehealth: Payer: Self-pay | Admitting: *Deleted

## 2017-12-06 ENCOUNTER — Telehealth: Payer: Self-pay | Admitting: Physician Assistant

## 2017-12-06 NOTE — Telephone Encounter (Signed)
Spoke with Charles Coffey re her husband. Patient was recently in the hospital and had colostomy. Was also dx with bladder cancer at that time per pt wife. She reported pt had not had a lot of output from ostomy in 2 days, only about "2/3rds" full of liquid stool per his wife. Also increasing b/l lower abdominal pain, now rated as an 8-9/10.  Patient had a decreased appetite. Has history of IBS-C and tired Amitiza last night, planning to take Linzess this morning. No nausea, vomiting, fever or chills.  Discussed that if patient had increasing pain and minimal output from ostomy would recommend they proceed to the ER for further eval as patient may have a blockage or other. Hard to evaluate exactly what is going on over the phone, especially in this medically complicated patient. Pt wife verbalized understanding. If pain increases or patient continues with low output or develops nausea/vomiting, fever or chills, they are planning to go to the ER.   Ellouise Newer, PA-C

## 2017-12-06 NOTE — Telephone Encounter (Signed)
Transition Care Management Follow-up Telephone Call  Per Discharge Summary: Admit date: 11/28/2017 Discharge date: 12/04/2017   Recommendations for Outpatient Follow-up:  1. Follow up outpatient CMP/CBC 2. Ensure follow up with urology, oncology, and surgery 3. Urology considering internalization of R stent once creatinine nadirs 4. For anemia, thrombocytopenia, f/u with heme/onc.  Consider starting iron as outpatient.  Was briefly started on B12 here, but on review B12 normal, consider repeat B12/MMA as needed as outpatient 5. Home heath for wound ostomy ordered 6. Would arrange f/u up with cardiology as well when able given demand ischemia  Discharge Diagnoses:  Principal Problem:   Pyelonephritis Active Problems:   Hyperlipidemia LDL goal <70   RESTLESS LEGS SYNDROME   Essential hypertension   Coronary artery disease of native heart with stable angina pectoris (HCC)   GERD   Cardiomyopathy, ischemic   Chronic low back pain   PAD (peripheral artery disease) (HCC)   Anemia   Stage 3 chronic kidney disease (HCC)   Hydronephrosis due to obstructive malignant neoplasm of bladder (HCC)   Fever   Ileus (HCC)   Elevated troponin   Urothelial carcinoma of bladder (HCC)   Dehydration   Thrombocytopenia (HCC)   Low vitamin B12 level   Irritable bowel syndrome with constipation   Rectal stenosis   Colostomy in place [facility])   Discharge Condition: stable  Diet recommendation: heart healthy  --   How have you been since you were released from the hospital? "Up and down. Had a little trouble getting the colostomy bag to work and got that going finally so things seem to be going like they're supposed to. That's a good thing."   Do you understand why you were in the hospital? yes   Do you understand the discharge instructions? yes   Where were you discharged to? Home   Items Reviewed:  Medications reviewed: yes  Allergies reviewed: yes  Dietary changes reviewed:  yes  Referrals reviewed: yes, home health   Functional Questionnaire:   Activities of Daily Living (ADLs):   He states they are independent in the following: ambulation, bathing and hygiene, feeding, continence, grooming, toileting and dressing States they require assistance with the following: none   Any transportation issues/concerns?: no   Any patient concerns? no   Confirmed importance and date/time of follow-up visits scheduled yes  Provider Appointment booked with Dr. Alysia Penna 12/14/17 @ 10:45am  Confirmed with patient if condition begins to worsen call PCP or go to the ER.  Patient was given the office number and encouraged to call back with question or concerns.  : yes

## 2017-12-07 ENCOUNTER — Telehealth: Payer: Self-pay | Admitting: *Deleted

## 2017-12-07 DIAGNOSIS — D126 Benign neoplasm of colon, unspecified: Secondary | ICD-10-CM | POA: Diagnosis not present

## 2017-12-07 DIAGNOSIS — Z433 Encounter for attention to colostomy: Secondary | ICD-10-CM | POA: Diagnosis not present

## 2017-12-07 DIAGNOSIS — K624 Stenosis of anus and rectum: Secondary | ICD-10-CM | POA: Diagnosis not present

## 2017-12-07 DIAGNOSIS — I255 Ischemic cardiomyopathy: Secondary | ICD-10-CM | POA: Diagnosis not present

## 2017-12-07 DIAGNOSIS — I2510 Atherosclerotic heart disease of native coronary artery without angina pectoris: Secondary | ICD-10-CM | POA: Diagnosis not present

## 2017-12-07 DIAGNOSIS — K589 Irritable bowel syndrome without diarrhea: Secondary | ICD-10-CM | POA: Diagnosis not present

## 2017-12-07 DIAGNOSIS — Z436 Encounter for attention to other artificial openings of urinary tract: Secondary | ICD-10-CM | POA: Diagnosis not present

## 2017-12-07 DIAGNOSIS — C679 Malignant neoplasm of bladder, unspecified: Secondary | ICD-10-CM | POA: Diagnosis not present

## 2017-12-07 DIAGNOSIS — I252 Old myocardial infarction: Secondary | ICD-10-CM | POA: Diagnosis not present

## 2017-12-07 DIAGNOSIS — K5904 Chronic idiopathic constipation: Secondary | ICD-10-CM | POA: Diagnosis not present

## 2017-12-07 NOTE — Telephone Encounter (Signed)
Copied from [place] [id number]. Topic: Referral - Request >> Dec 07, 2017 10:08 AM Scherrie Gerlach wrote: Reason for CRM: wife called to inquire that pt would like a 2nd opinion on his cancer dx. Pt would like to go to [facility].  [facility] and [facility] urology Dr Lorine Bears Address: [address], [place], [place] [address] Phone: [phone]

## 2017-12-07 NOTE — Telephone Encounter (Signed)
Please advise. Patient has upcoming TCM appt on 12/14/17.

## 2017-12-08 ENCOUNTER — Telehealth: Payer: Self-pay | Admitting: Family Medicine

## 2017-12-08 DIAGNOSIS — C670 Malignant neoplasm of trigone of bladder: Secondary | ICD-10-CM | POA: Diagnosis not present

## 2017-12-08 DIAGNOSIS — C662 Malignant neoplasm of left ureter: Secondary | ICD-10-CM | POA: Diagnosis not present

## 2017-12-08 DIAGNOSIS — N130 Hydronephrosis with ureteropelvic junction obstruction: Secondary | ICD-10-CM | POA: Diagnosis not present

## 2017-12-08 DIAGNOSIS — R609 Edema, unspecified: Secondary | ICD-10-CM | POA: Diagnosis not present

## 2017-12-08 NOTE — Telephone Encounter (Signed)
Called pt and left a VM to call back. CRM created.  

## 2017-12-08 NOTE — Telephone Encounter (Signed)
Sent to PCP for OK for verbal orders

## 2017-12-08 NOTE — Telephone Encounter (Signed)
Called Cecille Rubin and left her a VM with pt's name and DOB gave her the OK for the verbal orders she had requested.

## 2017-12-08 NOTE — Telephone Encounter (Signed)
Copied from [place] [phone]. Topic: Quick Communication - See Telephone Encounter >> Dec 08, 2017  8:40 AM Cleaster Corin, NT wrote: CRM for notification. See Telephone encounter for:   12/08/17. L[facility] from [facility] calling to get verbal order for in home care for nursing colostomy training and education. Cecille Rubin can be reached at [phone]

## 2017-12-08 NOTE — Telephone Encounter (Signed)
Let us talk about this at his TCM appt with me. That way I can review all his chart records.

## 2017-12-09 ENCOUNTER — Encounter: Payer: Self-pay | Admitting: Family Medicine

## 2017-12-09 ENCOUNTER — Ambulatory Visit (INDEPENDENT_AMBULATORY_CARE_PROVIDER_SITE_OTHER): Payer: Medicare HMO | Admitting: Family Medicine

## 2017-12-09 ENCOUNTER — Telehealth: Payer: Self-pay | Admitting: Pharmacist

## 2017-12-09 VITALS — BP 114/70 | HR 67 | Temp 97.8°F | Wt 195.0 lb

## 2017-12-09 DIAGNOSIS — Z936 Other artificial openings of urinary tract status: Secondary | ICD-10-CM

## 2017-12-09 DIAGNOSIS — I10 Essential (primary) hypertension: Secondary | ICD-10-CM

## 2017-12-09 DIAGNOSIS — C669 Malignant neoplasm of unspecified ureter: Secondary | ICD-10-CM

## 2017-12-09 DIAGNOSIS — D649 Anemia, unspecified: Secondary | ICD-10-CM | POA: Diagnosis not present

## 2017-12-09 DIAGNOSIS — N12 Tubulo-interstitial nephritis, not specified as acute or chronic: Secondary | ICD-10-CM

## 2017-12-09 DIAGNOSIS — C670 Malignant neoplasm of trigone of bladder: Secondary | ICD-10-CM

## 2017-12-09 DIAGNOSIS — N183 Chronic kidney disease, stage 3 unspecified: Secondary | ICD-10-CM

## 2017-12-09 DIAGNOSIS — N1330 Unspecified hydronephrosis: Secondary | ICD-10-CM

## 2017-12-09 DIAGNOSIS — C679 Malignant neoplasm of bladder, unspecified: Secondary | ICD-10-CM

## 2017-12-09 DIAGNOSIS — D696 Thrombocytopenia, unspecified: Secondary | ICD-10-CM

## 2017-12-09 MED ORDER — OXYCODONE HCL 5 MG PO TABS: 5.0000 mg | ORAL_TABLET | Freq: Four times a day (QID) | ORAL | 0 refills | Status: DC | PRN

## 2017-12-09 NOTE — Progress Notes (Signed)
wife called to inquire that pt would like a 2nd opinion on his cancer dx. Pt would like to go to [facility].  [facility] and [facility] urology Dr Lorine Bears Address: [address], [place], [place] [address] Phone: [phone]

## 2017-12-09 NOTE — Telephone Encounter (Signed)
Pt advised and voiced understanding.   

## 2017-12-09 NOTE — Progress Notes (Signed)
   Subjective:    Patient ID: Charles Coffey, male    DOB: 17-Jan-1935, 82 y.o.   MRN: [id number]  HPI Here with his wife and daughter to follow up a hospital stay from 11-28-17 to 12-04-17 for a pyelonephritis. 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 He was seen by Dr. Rueben Bash of Urology and by Dr. Zola Button of Oncology. He has bilateral percutaneous nephrostomies which drain to an external bag. He had an acute exacerbation of chronic renal failure, but his creatinine ad returned to his baseline at 1.46 by the time of DC. He sees Dr. Florene Glen for Nephrology care. He has ongoing anemia and thrombocytopenia, and these remained stable with a DC Hgb of 7.8 and a platelet count of 54 K. He was sent home on Lasix 20 mg daily. He denies any SOB or ankle swelling. His blood cultures and urine cultures remained no growth, but he was sent home on Keflex. Today he feels fairly well. His appetite is good. The cancer team was debating treatment options and in fact his case will be discussed at Tumor Board tomorrow. The family is anxious and would like to get a second opinion about the next steps he should take. They ask to be referred to Dr. Lorine Bears at [facility], who is a urologic oncologist.    Review of Systems  Constitutional: Negative.   Cardiovascular: Negative.   Genitourinary: Negative for flank pain and hematuria.  Neurological: Negative.        Objective:   Physical Exam  Constitutional: He is oriented to person, place, and time. He appears well-developed and well-nourished.  Neck: No thyromegaly present.  Cardiovascular: Normal rate, regular rhythm, normal heart sounds and intact distal pulses.  Pulmonary/Chest: Effort normal and breath sounds normal. No respiratory distress. He has no wheezes. He has no rales.  Abdominal: Soft. Bowel sounds are normal. He exhibits no distension and no mass. There is no  tenderness. There is no rebound and no guarding.  Musculoskeletal: He exhibits no edema.  Mildly tender around the right nephrostomy site   Lymphadenopathy:    He has no cervical adenopathy.  Neurological: He is alert and oriented to person, place, and time.          Assessment & Plan:  He is recovering from a recent pyelonephritis and has been diagnosed with an invasive urothelial carcinoma. His case is being handled currently by Dr. Roni Bread and Dr. [name], but at the family's request we will also refer him to Dr. Dimas Millin at [facility]. His renal function is stable. His chronic anemia and thrombocytopenia are stable.  Alysia Penna, MD

## 2017-12-09 NOTE — Telephone Encounter (Signed)
Repatha approved by insurance with [phone] co-pay. Patient is able to afford. He will be calling this week to arrange delivery from [place]

## 2017-12-10 DIAGNOSIS — C679 Malignant neoplasm of bladder, unspecified: Secondary | ICD-10-CM | POA: Diagnosis not present

## 2017-12-10 DIAGNOSIS — K589 Irritable bowel syndrome without diarrhea: Secondary | ICD-10-CM | POA: Diagnosis not present

## 2017-12-10 DIAGNOSIS — I252 Old myocardial infarction: Secondary | ICD-10-CM | POA: Diagnosis not present

## 2017-12-10 DIAGNOSIS — I255 Ischemic cardiomyopathy: Secondary | ICD-10-CM | POA: Diagnosis not present

## 2017-12-10 DIAGNOSIS — Z433 Encounter for attention to colostomy: Secondary | ICD-10-CM | POA: Diagnosis not present

## 2017-12-10 DIAGNOSIS — K624 Stenosis of anus and rectum: Secondary | ICD-10-CM | POA: Diagnosis not present

## 2017-12-10 DIAGNOSIS — K5904 Chronic idiopathic constipation: Secondary | ICD-10-CM | POA: Diagnosis not present

## 2017-12-10 DIAGNOSIS — I2510 Atherosclerotic heart disease of native coronary artery without angina pectoris: Secondary | ICD-10-CM | POA: Diagnosis not present

## 2017-12-10 DIAGNOSIS — Z436 Encounter for attention to other artificial openings of urinary tract: Secondary | ICD-10-CM | POA: Diagnosis not present

## 2017-12-10 DIAGNOSIS — D126 Benign neoplasm of colon, unspecified: Secondary | ICD-10-CM | POA: Diagnosis not present

## 2017-12-11 ENCOUNTER — Telehealth: Payer: Self-pay | Admitting: Oncology

## 2017-12-11 NOTE — Telephone Encounter (Signed)
Scheduled appt per 12/14 sch message - patient is aware of appt change and cancelled appt.

## 2017-12-13 ENCOUNTER — Encounter: Payer: Self-pay | Admitting: Family Medicine

## 2017-12-13 DIAGNOSIS — K5904 Chronic idiopathic constipation: Secondary | ICD-10-CM | POA: Diagnosis not present

## 2017-12-13 DIAGNOSIS — C679 Malignant neoplasm of bladder, unspecified: Secondary | ICD-10-CM | POA: Diagnosis not present

## 2017-12-13 DIAGNOSIS — D126 Benign neoplasm of colon, unspecified: Secondary | ICD-10-CM | POA: Diagnosis not present

## 2017-12-13 DIAGNOSIS — Z433 Encounter for attention to colostomy: Secondary | ICD-10-CM | POA: Diagnosis not present

## 2017-12-13 DIAGNOSIS — Z436 Encounter for attention to other artificial openings of urinary tract: Secondary | ICD-10-CM | POA: Diagnosis not present

## 2017-12-13 DIAGNOSIS — K589 Irritable bowel syndrome without diarrhea: Secondary | ICD-10-CM | POA: Diagnosis not present

## 2017-12-13 DIAGNOSIS — K624 Stenosis of anus and rectum: Secondary | ICD-10-CM | POA: Diagnosis not present

## 2017-12-13 DIAGNOSIS — I252 Old myocardial infarction: Secondary | ICD-10-CM | POA: Diagnosis not present

## 2017-12-13 DIAGNOSIS — I255 Ischemic cardiomyopathy: Secondary | ICD-10-CM | POA: Diagnosis not present

## 2017-12-13 DIAGNOSIS — I2510 Atherosclerotic heart disease of native coronary artery without angina pectoris: Secondary | ICD-10-CM | POA: Diagnosis not present

## 2017-12-14 ENCOUNTER — Inpatient Hospital Stay: Payer: Medicare HMO | Admitting: Family Medicine

## 2017-12-14 NOTE — Telephone Encounter (Signed)
The referral was done  

## 2017-12-15 ENCOUNTER — Ambulatory Visit (HOSPITAL_BASED_OUTPATIENT_CLINIC_OR_DEPARTMENT_OTHER): Payer: Medicare HMO

## 2017-12-15 ENCOUNTER — Other Ambulatory Visit: Payer: Medicare HMO

## 2017-12-15 ENCOUNTER — Ambulatory Visit (HOSPITAL_BASED_OUTPATIENT_CLINIC_OR_DEPARTMENT_OTHER): Payer: Medicare HMO | Admitting: Oncology

## 2017-12-15 ENCOUNTER — Ambulatory Visit: Payer: Medicare HMO | Admitting: Oncology

## 2017-12-15 VITALS — BP 117/65 | HR 70 | Temp 97.8°F | Resp 18 | Ht 68.5 in | Wt 188.7 lb

## 2017-12-15 DIAGNOSIS — C679 Malignant neoplasm of bladder, unspecified: Secondary | ICD-10-CM

## 2017-12-15 DIAGNOSIS — N1330 Unspecified hydronephrosis: Secondary | ICD-10-CM

## 2017-12-15 DIAGNOSIS — D696 Thrombocytopenia, unspecified: Secondary | ICD-10-CM | POA: Diagnosis not present

## 2017-12-15 DIAGNOSIS — D649 Anemia, unspecified: Secondary | ICD-10-CM

## 2017-12-15 DIAGNOSIS — R609 Edema, unspecified: Secondary | ICD-10-CM

## 2017-12-15 DIAGNOSIS — Z7189 Other specified counseling: Secondary | ICD-10-CM

## 2017-12-15 DIAGNOSIS — K6289 Other specified diseases of anus and rectum: Secondary | ICD-10-CM | POA: Diagnosis not present

## 2017-12-15 LAB — COMPREHENSIVE METABOLIC PANEL
ALT: 10 U/L (ref 0–55)
ANION GAP: 9 meq/L (ref 3–11)
AST: 15 U/L (ref 5–34)
Albumin: 3.9 g/dL (ref 3.5–5.0)
Alkaline Phosphatase: 63 U/L (ref 40–150)
BUN: 18.1 mg/dL (ref 7.0–26.0)
CALCIUM: 9.1 mg/dL (ref 8.4–10.4)
CHLORIDE: 106 meq/L (ref 98–109)
CO2: 19 meq/L — AB (ref 22–29)
CREATININE: 1.7 mg/dL — AB (ref 0.7–1.3)
EGFR: 36 mL/min/{1.73_m2} — AB (ref >60)
Glucose: 94 mg/dl (ref 70–140)
Potassium: 4.6 mEq/L (ref 3.5–5.1)
Sodium: 135 mEq/L — ABNORMAL LOW (ref 136–145)
Total Bilirubin: 0.38 mg/dL (ref 0.20–1.20)
Total Protein: 7 g/dL (ref 6.4–8.3)

## 2017-12-15 LAB — CBC WITH DIFFERENTIAL/PLATELET
BASO%: 0.4 % (ref 0.0–2.0)
Basophils Absolute: 0 10*3/uL (ref 0.0–0.1)
EOS ABS: 0.2 10*3/uL (ref 0.0–0.5)
EOS%: 3.8 % (ref 0.0–7.0)
HEMATOCRIT: 26.4 % — AB (ref 38.4–49.9)
HGB: 8.7 g/dL — ABNORMAL LOW (ref 13.0–17.1)
LYMPH#: 1.8 10*3/uL (ref 0.9–3.3)
LYMPH%: 36.3 % (ref 14.0–49.0)
MCH: 30.1 pg (ref 27.2–33.4)
MCHC: 33 g/dL (ref 32.0–36.0)
MCV: 91.3 fL (ref 79.3–98.0)
MONO#: 0.4 10*3/uL (ref 0.1–0.9)
MONO%: 7.2 % (ref 0.0–14.0)
NEUT%: 52.3 % (ref 39.0–75.0)
NEUTROS ABS: 2.6 10*3/uL (ref 1.5–6.5)
NRBC: 0 % (ref 0–0)
PLATELETS: 78 10*3/uL — AB (ref 140–400)
RBC: 2.89 10*6/uL — ABNORMAL LOW (ref 4.20–5.82)
RDW: 15.9 % — AB (ref 11.0–14.6)
WBC: 5 10*3/uL (ref 4.0–10.3)

## 2017-12-15 MED ORDER — PROCHLORPERAZINE MALEATE 10 MG PO TABS: 10.0000 mg | ORAL_TABLET | Freq: Four times a day (QID) | ORAL | 0 refills | Status: DC | PRN

## 2017-12-15 MED ORDER — LIDOCAINE-PRILOCAINE 2.5-2.5 % EX CREA: 1.0000 "application " | TOPICAL_CREAM | CUTANEOUS | 0 refills | Status: DC | PRN

## 2017-12-15 NOTE — Progress Notes (Signed)
Hematology and Oncology Follow Up Visit  Charles Coffey [id number] January 24, 1935 82 y.o. 12/15/2017 2:16 PM Charles Coffey, MDFry, Charles Holter, MD   Principle Diagnosis: 82 year old gentleman with advanced urothelial carcinoma involving the left genitourinary tract and bladder diagnosed in November 2018.  He has a disease infiltrating through the bladder into the rectum as well as possible retroperitoneal involvement.    Prior Therapy:  He is status post cystoscopy and transurethral resection of a bladder tumor and on 11/25/2017. He is status post bilateral nephrostomy tube placement in November 2018. He is status post diverting colostomy done on 12/01/2017 performed by Dr. Barry Dienes due to colonic obstruction.  Current therapy: Under evaluation for possible systemic therapy.  Interim History: Charles Coffey presents today for a follow-up visit.  I saw him while he was hospitalized in November 2018 for the above diagnosis.  I have seen him in the past for the diagnosis of mild anemia and thrombocytopenia.  During his hospitalization he underwent procedures above and is recovering slowly.  He reports his performance status is slowly improving and his activity level is also improved.  He does have bilateral nephrostomy tube which are healing properly.  He continues to have rectal pain periodically which has bothered him at nighttime.  His colostomy output has been reasonably normal.  He did report lower extremity edema which has improved after starting Lasix by his primary care physician.  He denies any hematochezia, melena or epistaxis.  He denies any chest pain or difficulty breathing.  He still able to go up and down the steps without any major difficulties.  He does not report any headaches, blurry vision, syncope or seizures. He does not report any fevers, chills, sweats. He does not report any palpitation, orthopnea or leg edema. He does not report any cough, wheezing or hemoptysis. He does not report  any nausea, vomiting or abdominal pain. He does not report any frequency urgency or hesitancy. He does not report any skeletal complaints rales or myalgias. Remaining review of systems unremarkable.    Medications: I have reviewed the patient's current medications.  Current Outpatient Medications  Medication Sig Dispense Refill  . acetaminophen (TYLENOL) 500 MG tablet Take 1,000 mg by mouth every 8 (eight) hours as needed for mild pain or moderate pain.    . carvedilol (COREG) 6.25 MG tablet Take 1 tablet (6.25 mg total) 2 (two) times daily with a meal by mouth. 180 tablet 3  . diphenhydramine-acetaminophen (TYLENOL PM EXTRA STRENGTH) 25-500 MG TABS tablet Take 1 tablet by mouth at bedtime as needed.     . furosemide (LASIX) 20 MG tablet Take 20 mg by mouth daily.    . Hydrocortisone Acetate (HEMORRHOIDAL-HC RE) Place 1 application rectally as needed (hemorrhoids).    . isosorbide mononitrate (IMDUR) 60 MG 24 hr tablet Take 1 tablet (60 mg total) by mouth daily. 90 tablet 1  . lidocaine-prilocaine (EMLA) cream Apply 1 application topically as needed. 30 g 0  . linaclotide (LINZESS) 290 MCG CAPS capsule TAKE 1 CAPSULE (290 MCG TOTAL) BY MOUTH DAILY. 30 MINUTES PRIOR TO A MEAL 30 capsule 10  . lubiprostone (AMITIZA) 24 MCG capsule Take 1 capsule (24 mcg total) by mouth 2 (two) times daily with a meal. 60 capsule 3  . magnesium citrate SOLN Take 1 Bottle by mouth as needed.     . nitroGLYCERIN (NITROSTAT) 0.4 MG SL tablet Place 1 tablet (0.4 mg total) under the tongue every 5 (five) minutes as needed. (Patient taking differently:  Place 0.4 mg under the tongue every 5 (five) minutes as needed for chest pain. ) 25 tablet 3  . oxyCODONE (ROXICODONE) 5 MG immediate release tablet Take 1 tablet (5 mg total) by mouth every 6 (six) hours as needed. 120 tablet 0  . Plant Sterols and Stanols (CHOLESTOFF PO) Take 2 tablets by mouth 2 (two) times daily.    . pramipexole (MIRAPEX) 1 MG tablet TAKE 1 AND 1/2  TABLETS BY MOUTH AT BEDTIME 45 tablet 11  . prochlorperazine (COMPAZINE) 10 MG tablet Take 1 tablet (10 mg total) by mouth every 6 (six) hours as needed for nausea or vomiting. 30 tablet 0  . ranitidine (ZANTAC) 300 MG tablet TAKE 1 TABLET BY MOUTH TWICE A DAY 60 tablet 3  . Tetrahydroz-Glyc-Hyprom-PEG (VISINE MAXIMUM REDNESS RELIEF OP) Place 2 drops into both eyes 2 (two) times daily as needed (dry eyes).     . traMADol (ULTRAM) 50 MG tablet Take 50 mg by mouth every 6 (six) hours as needed for moderate pain.      No current facility-administered medications for this visit.      Allergies:  Allergies  Allergen Reactions  . Antihistamines, Loratadine-Type Other (See Comments)    Unable to urinate  . Statins Other (See Comments)    liver effects    Past Medical History, Surgical history, Social history, and Family History were reviewed and updated.   Physical Exam: Blood pressure 117/65, pulse 70, temperature 97.8 F (36.6 C), temperature source Oral, resp. rate 18, height 5' 8.5" (1.74 m), weight 188 lb 11.2 oz (85.6 kg), SpO2 99 %. ECOG: 1 General appearance: alert and cooperative appears without distress. Head: Normocephalic, without obvious abnormality or oral ulcers or lesions. Neck: no adenopathy or neck masses. Lymph nodes: Cervical, supraclavicular, and axillary nodes normal. Heart:regular rate and rhythm, S1, S2 normal, no murmur, click, rub or gallop Lung:chest clear, no wheezing, rales, normal symmetric air entry. Abdomin: soft, non-tender, without masses or organomegaly without shifting dullness or ascites. EXT:no erythema, induration, or nodules   Lab Results: Lab Results  Component Value Date   WBC 6.2 12/04/2017   HGB 7.8 (L) 12/04/2017   HCT 23.0 (L) 12/04/2017   MCV 93.1 12/04/2017   PLT 54 (L) 12/04/2017     Chemistry      Component Value Date/Time   NA 136 12/04/2017 0602   NA 139 08/31/2017 1135   K 4.0 12/04/2017 0602   CL 107 12/04/2017 0602    CO2 23 12/04/2017 0602   BUN 17 12/04/2017 0602   BUN 22 08/31/2017 1135   CREATININE 1.46 (H) 12/04/2017 0602   CREATININE 1.23 (H) 05/19/2016 1447      Component Value Date/Time   CALCIUM 8.5 (L) 12/04/2017 0602   ALKPHOS 50 12/02/2017 0611   AST 32 12/02/2017 0611   ALT 18 12/02/2017 0611   BILITOT 0.8 12/02/2017 0611     EXAM: CT ABDOMEN AND PELVIS WITHOUT CONTRAST  TECHNIQUE: Multidetector CT imaging of the abdomen and pelvis was performed following the standard protocol without IV contrast.  COMPARISON:  11/11/2017  FINDINGS: Lower chest: Small right and minimal left pleural effusions. Pleural effusions are new since the prior CT. Mild interstitial thickening at the lung bases as well as subsegmental atelectasis, greater on the right. Interstitial thickening is similar to the prior CT. No convincing pneumonia.  Hepatobiliary: No focal liver abnormality is seen. No gallstones, gallbladder wall thickening, or biliary dilatation.  Pancreas: Unremarkable. No pancreatic ductal dilatation or surrounding  inflammatory changes.  Spleen: Normal in size without focal abnormality.  Adrenals/Urinary Tract: Residual contrast fills the moderately dilated left intrarenal collecting system and portions of the left ureter. There is mild dilation of the right intrarenal collecting system and portions of the right ureter. There is diffuse left renal cortical thinning. No renal masses. No visualized stones.  No adrenal masses.  Irregular thickening along the posterior aspect of the bladder, greater on the left, is similar to the prior CT.  Stomach/Bowel: Stomach is unremarkable. The small bowel and colon show air-fluid levels, but are nondilated with no wall thickening or adjacent inflammation. This is consistent with a mild diffuse adynamic ileus. No evidence of obstruction. Mild colonic stool burden. Appendix not visualized.  Vascular/Lymphatic: Aortic  atherosclerosis. No enlarged abdominal or pelvic lymph nodes.  Reproductive: Prostate gland is enlarged this, bulging against the posterior inferior bladder base, stable from the recent CT.  Other: No abdominal wall hernia or abnormality. No abdominopelvic ascites.  Musculoskeletal: No fracture or acute finding. No osteoblastic or osteolytic lesions. Stable changes from a previous L3-L4 posterior lumbar spine fusion. Chronic bilateral pars defects with a grade 1 anterolisthesis at L5-S1.  IMPRESSION: 1. Moderate left hydronephrosis. There is residual contrast filling of the left intrarenal collecting system and portions of the left ureter. This is consistent with moderate obstructive uropathy, which appears due to an area of irregular bladder wall thickening or mass crossing the left ureteral trigone. 2. Mild right hydronephrosis. Right hydronephrosis has increased from the previous CT scan. Left hydronephrosis is unchanged. There is left perinephric and peri ureteral stranding, which is similar to the prior exam which may be the result of the obstruction only, but infection should also be considered. 3. Small right and minimal left pleural effusions. Mild lung base interstitial thickening. Consider mild congestive heart failure given the symptoms of shortness of breath.    Impression and Plan:   82 year old gentleman with the following  1.  Advanced urothelial carcinoma arising from the left genitourinary tract pathology review as well as the renal pelvis, ureter and bladder.  His disease has documented T4 involvement with rectal involvement as well as a retroperitoneal involvement that was biopsy-proven as well after a diverting colostomy.  No distant metastasis has been documented at this point.  The natural course of this disease was discussed today with the patient and his family.  His case was also discussed extensively at the GU tumor board including pathology review  as well as review with radiology.  His disease appears to be rather extensive and certainly is beyond the bladder as evident by his colonic infiltration as well as retroperitoneal biopsies.  In this presentation, I do not think there is curative options moving forward.  Palliative options with chemotherapy is his best choice with potentially using salvage surgery if he has a dramatic response with no residual disease.    The risks and benefits of platinum based chemotherapy was discussed today with the patient and his family.  Gemcitabine and carboplatin or cisplatin can be used in this setting depending on his kidney function and cytopenia.  Complication associated with this treatment would include nausea, vomiting, myelosuppression, neutropenia, neutropenic sepsis and possible bleeding.  Electrolyte imbalance as well as renal dysfunction could also be a possibility.  After discussion today, he is agreeable to proceed with chemotherapy after education class.  2.  Bilateral hydronephrosis: He has bilateral nephrostomy tube placed in his kidney function will be repeated today.  3.  Thrombocytopenia: Could be  related to his recent hospitalization and sepsis.  We will repeat counts today to ensure stability before proceeding with chemotherapy.  4.  IV access: Risks and benefits of Port-A-Cath insertion was discussed today and this will be inserted in the near future before chemotherapy administration.  Complications such as bleeding, infection and thrombosis were discussed.  5.  Antiemetics: Prescription for Compazine was made available to the patient.  6.  Follow-up: Will be in the next 2 weeks to start therapy.   Zola Button, MD 12/19/20182:16 PM

## 2017-12-15 NOTE — Progress Notes (Signed)
START ON PATHWAY REGIMEN - Bladder     A cycle is every 21 days:     Carboplatin      Gemcitabine   **Always confirm dose/schedule in your pharmacy ordering system**    Patient Characteristics: Metastatic Disease, First Line, No Prior Neoadjuvant/Adjuvant Therapy, Poor Renal Function (CrCl < 50 mL/min), Unknown PD-L1 Expression AJCC M Category: Staged < 8th Ed. AJCC N Category: Staged < 8th Ed. AJCC T Category: Staged < 8th Ed. Current evidence of distant metastases<= Yes AJCC 8 Stage Grouping: Staged < 8th Ed. Line of Therapy: First Line Would you be surprised if this patient died  in the next year<= I would be surprised if this patient died in the next year Prior Neoadjuvant/Adjuvant Therapy<= No Renal Function: Poor Renal Function (CrCl < 50 mL/min) PD-L1 Expression Status: Unknown PD-L1 Expression Intent of Therapy: Non-Curative / Palliative Intent, Discussed with Patient

## 2017-12-16 ENCOUNTER — Encounter: Payer: Self-pay | Admitting: Oncology

## 2017-12-16 DIAGNOSIS — I255 Ischemic cardiomyopathy: Secondary | ICD-10-CM | POA: Diagnosis not present

## 2017-12-16 DIAGNOSIS — K624 Stenosis of anus and rectum: Secondary | ICD-10-CM | POA: Diagnosis not present

## 2017-12-16 DIAGNOSIS — Z933 Colostomy status: Secondary | ICD-10-CM | POA: Diagnosis not present

## 2017-12-16 DIAGNOSIS — I252 Old myocardial infarction: Secondary | ICD-10-CM | POA: Diagnosis not present

## 2017-12-16 DIAGNOSIS — Z433 Encounter for attention to colostomy: Secondary | ICD-10-CM | POA: Diagnosis not present

## 2017-12-16 DIAGNOSIS — D126 Benign neoplasm of colon, unspecified: Secondary | ICD-10-CM | POA: Diagnosis not present

## 2017-12-16 DIAGNOSIS — K589 Irritable bowel syndrome without diarrhea: Secondary | ICD-10-CM | POA: Diagnosis not present

## 2017-12-16 DIAGNOSIS — C679 Malignant neoplasm of bladder, unspecified: Secondary | ICD-10-CM | POA: Diagnosis not present

## 2017-12-16 DIAGNOSIS — Z436 Encounter for attention to other artificial openings of urinary tract: Secondary | ICD-10-CM | POA: Diagnosis not present

## 2017-12-16 DIAGNOSIS — K5904 Chronic idiopathic constipation: Secondary | ICD-10-CM | POA: Diagnosis not present

## 2017-12-16 DIAGNOSIS — I2510 Atherosclerotic heart disease of native coronary artery without angina pectoris: Secondary | ICD-10-CM | POA: Diagnosis not present

## 2017-12-16 NOTE — Progress Notes (Signed)
Submitted auth request for Lidocaine/Prilocaine today.  Status is pending.  °

## 2017-12-17 ENCOUNTER — Encounter: Payer: Self-pay | Admitting: Oncology

## 2017-12-17 ENCOUNTER — Telehealth: Payer: Self-pay | Admitting: Oncology

## 2017-12-17 NOTE — Telephone Encounter (Signed)
Called patient regarding 1/2 and 1/3

## 2017-12-20 ENCOUNTER — Encounter: Payer: Self-pay | Admitting: Oncology

## 2017-12-20 ENCOUNTER — Encounter: Payer: Self-pay | Admitting: Family Medicine

## 2017-12-20 ENCOUNTER — Other Ambulatory Visit: Payer: Self-pay

## 2017-12-20 ENCOUNTER — Other Ambulatory Visit: Payer: Medicare HMO

## 2017-12-20 NOTE — Progress Notes (Signed)
Pt's Lidocaine-Prilocaine was approved from 12/26/16 to 03/17/18.

## 2017-12-20 NOTE — Telephone Encounter (Signed)
Call in Temazepam 30 mg qhs, #30 with 2 rf 

## 2017-12-22 ENCOUNTER — Encounter: Payer: Self-pay | Admitting: General Practice

## 2017-12-22 NOTE — Progress Notes (Signed)
[place] Spiritual Care Note  Referred by Casandra Doffing to phone [name] and wife [name] for emotional support due to the distress of so many recent health changes (and the role, identity, and other life changes that come with them). Reached [name] while pt was sleeping. She used the opportunity well to share and process her own challenges and those she sees for him. Provided empathic listening, emotional support, normalization of feelings, intro to Spiritual Care services, and concrete coping suggestions (how to frame and verbalize requests for help, potential usefulness of communication sites such as CaringBridge, etc). [name] reports good support from local friends--with the caveat that "they're also old--and busy!" [name]'s and [name]'s children are also visiting in rotation.  [name] was very receptive to [facility] and now has my direct dial number; she or pt may call in the next couple of days (depending on pt's energy/pain/sleep schedule), and we plan for me to f/u by phone on Monday 1/7.   [place], [place], [facility] Pager [phone] Voicemail [phone]

## 2017-12-23 ENCOUNTER — Telehealth: Payer: Self-pay | Admitting: General Surgery

## 2017-12-23 ENCOUNTER — Other Ambulatory Visit: Payer: Self-pay

## 2017-12-23 ENCOUNTER — Encounter: Payer: Self-pay | Admitting: Family Medicine

## 2017-12-23 ENCOUNTER — Other Ambulatory Visit (HOSPITAL_COMMUNITY): Payer: Self-pay | Admitting: General Surgery

## 2017-12-23 DIAGNOSIS — I255 Ischemic cardiomyopathy: Secondary | ICD-10-CM | POA: Diagnosis not present

## 2017-12-23 DIAGNOSIS — Z436 Encounter for attention to other artificial openings of urinary tract: Secondary | ICD-10-CM | POA: Diagnosis not present

## 2017-12-23 DIAGNOSIS — R52 Pain, unspecified: Secondary | ICD-10-CM

## 2017-12-23 DIAGNOSIS — K589 Irritable bowel syndrome without diarrhea: Secondary | ICD-10-CM | POA: Diagnosis not present

## 2017-12-23 DIAGNOSIS — D126 Benign neoplasm of colon, unspecified: Secondary | ICD-10-CM | POA: Diagnosis not present

## 2017-12-23 DIAGNOSIS — I2510 Atherosclerotic heart disease of native coronary artery without angina pectoris: Secondary | ICD-10-CM | POA: Diagnosis not present

## 2017-12-23 DIAGNOSIS — K5904 Chronic idiopathic constipation: Secondary | ICD-10-CM | POA: Diagnosis not present

## 2017-12-23 DIAGNOSIS — K624 Stenosis of anus and rectum: Secondary | ICD-10-CM | POA: Diagnosis not present

## 2017-12-23 DIAGNOSIS — Z433 Encounter for attention to colostomy: Secondary | ICD-10-CM | POA: Diagnosis not present

## 2017-12-23 DIAGNOSIS — C679 Malignant neoplasm of bladder, unspecified: Secondary | ICD-10-CM | POA: Diagnosis not present

## 2017-12-23 DIAGNOSIS — I252 Old myocardial infarction: Secondary | ICD-10-CM | POA: Diagnosis not present

## 2017-12-23 NOTE — Progress Notes (Signed)
Patient's wife called to state the patient has been having right sided groin to back pain.  He has a history of bladder cancer and recently had a diverting colostomy as well as bilateral PCN placements on 12/14 and 12/7 respectively.  On 12/24 the wife notes some BRB from his right PCN, but this cleared up and went away.  The left isn't draining much per the wife.  His pain is worse when he lays down than when he stands.  He took 3 oxycodone the other day with minimal relief.  He stood in front of the fire place which seemed to help his pain some from the heat.  Given everything the patient has going on, the wife decided to call IR first given his PCNs.  I have scheduled the patient to come in tomorrow for his drain to be injected to make sure they are working well.  If his drain injections are ok, then he will need to call his urologist as he may have something else going on in his pelvis causing his pain.  Our scheduler will give them a call to give them their appointment time.  [name] was grateful for our help.  Henreitta Cea 2:31 PM 12/23/2017

## 2017-12-23 NOTE — Telephone Encounter (Signed)
Call in Call in Temazepam 30 mg qhs, #30 with 2 rf [facility] to [facility] sent to [facility]. Called in Rx however, unable to go through with the scrip pharmacist stated that this medication is on manufacture backorder and they do NOT have it available at another [facility] pharmacy.

## 2017-12-24 ENCOUNTER — Telehealth: Payer: Self-pay

## 2017-12-24 ENCOUNTER — Ambulatory Visit (HOSPITAL_COMMUNITY)
Admission: RE | Admit: 2017-12-24 | Discharge: 2017-12-24 | Disposition: A | Discharge status: Home / Self Care | Payer: Medicare HMO | Source: Ambulatory Visit | Attending: General Surgery | Admitting: General Surgery

## 2017-12-24 ENCOUNTER — Encounter: Payer: Self-pay | Admitting: Family Medicine

## 2017-12-24 ENCOUNTER — Encounter (HOSPITAL_COMMUNITY): Payer: Self-pay | Admitting: Interventional Radiology

## 2017-12-24 ENCOUNTER — Other Ambulatory Visit: Payer: Self-pay | Admitting: Student

## 2017-12-24 ENCOUNTER — Other Ambulatory Visit: Payer: Self-pay | Admitting: Radiology

## 2017-12-24 ENCOUNTER — Other Ambulatory Visit (HOSPITAL_COMMUNITY): Payer: Self-pay | Admitting: General Surgery

## 2017-12-24 DIAGNOSIS — R52 Pain, unspecified: Secondary | ICD-10-CM

## 2017-12-24 DIAGNOSIS — C801 Malignant (primary) neoplasm, unspecified: Secondary | ICD-10-CM | POA: Diagnosis not present

## 2017-12-24 DIAGNOSIS — T83128A Displacement of other urinary devices and implants, initial encounter: Secondary | ICD-10-CM | POA: Diagnosis not present

## 2017-12-24 DIAGNOSIS — K624 Stenosis of anus and rectum: Secondary | ICD-10-CM | POA: Diagnosis not present

## 2017-12-24 DIAGNOSIS — R109 Unspecified abdominal pain: Secondary | ICD-10-CM | POA: Insufficient documentation

## 2017-12-24 DIAGNOSIS — Z933 Colostomy status: Secondary | ICD-10-CM | POA: Diagnosis not present

## 2017-12-24 HISTORY — PX: IR NEPHROSTOMY EXCHANGE RIGHT: IMG6070

## 2017-12-24 HISTORY — PX: IR NEPHROSTOGRAM LEFT THRU EXISTING ACCESS: IMG6061

## 2017-12-24 MED ORDER — IOPAMIDOL (ISOVUE-300) INJECTION 61%
INTRAVENOUS | Status: AC
  Administered 2017-12-24: 25 mL
  Filled 2017-12-24: qty 50

## 2017-12-24 MED ORDER — TEMAZEPAM 30 MG PO CAPS: 30.0000 mg | ORAL_CAPSULE | Freq: Every evening | ORAL | 2 refills | Status: DC | PRN

## 2017-12-24 MED ORDER — IOPAMIDOL (ISOVUE-300) INJECTION 61%
50.0000 mL | Freq: Once | INTRAVENOUS | Status: AC | PRN
  Administered 2017-12-24: 25 mL

## 2017-12-24 MED ORDER — LIDOCAINE HCL 1 % IJ SOLN
INTRAMUSCULAR | Status: DC | PRN
  Administered 2017-12-24: 5 mL via INTRADERMAL

## 2017-12-24 MED ORDER — LIDOCAINE HCL (PF) 2 % IJ SOLN
INTRAMUSCULAR | Status: AC
  Filled 2017-12-24: qty 10

## 2017-12-24 NOTE — Telephone Encounter (Signed)
Called in Rx for Temazepam 30 MG qhs disp 30 with 2 refills. Sent to [facility] in [place] as requested by pt's wife.

## 2017-12-24 NOTE — Procedures (Signed)
R PCN exchange EBL 0 Comp 0 

## 2017-12-27 ENCOUNTER — Other Ambulatory Visit: Payer: Self-pay | Admitting: Oncology

## 2017-12-27 ENCOUNTER — Ambulatory Visit (HOSPITAL_COMMUNITY)
Admission: RE | Admit: 2017-12-27 | Discharge: 2017-12-27 | Disposition: A | Discharge status: Home / Self Care | Payer: Medicare HMO | Source: Ambulatory Visit | Attending: Oncology | Admitting: Oncology

## 2017-12-27 ENCOUNTER — Encounter (HOSPITAL_COMMUNITY): Payer: Self-pay

## 2017-12-27 DIAGNOSIS — K219 Gastro-esophageal reflux disease without esophagitis: Secondary | ICD-10-CM | POA: Insufficient documentation

## 2017-12-27 DIAGNOSIS — Z87891 Personal history of nicotine dependence: Secondary | ICD-10-CM | POA: Diagnosis not present

## 2017-12-27 DIAGNOSIS — Z951 Presence of aortocoronary bypass graft: Secondary | ICD-10-CM | POA: Insufficient documentation

## 2017-12-27 DIAGNOSIS — I252 Old myocardial infarction: Secondary | ICD-10-CM | POA: Diagnosis not present

## 2017-12-27 DIAGNOSIS — C679 Malignant neoplasm of bladder, unspecified: Secondary | ICD-10-CM | POA: Insufficient documentation

## 2017-12-27 DIAGNOSIS — Z5111 Encounter for antineoplastic chemotherapy: Secondary | ICD-10-CM | POA: Diagnosis not present

## 2017-12-27 DIAGNOSIS — I700 Atherosclerosis of aorta: Secondary | ICD-10-CM | POA: Insufficient documentation

## 2017-12-27 DIAGNOSIS — I2510 Atherosclerotic heart disease of native coronary artery without angina pectoris: Secondary | ICD-10-CM | POA: Insufficient documentation

## 2017-12-27 DIAGNOSIS — Z933 Colostomy status: Secondary | ICD-10-CM | POA: Diagnosis not present

## 2017-12-27 DIAGNOSIS — I255 Ischemic cardiomyopathy: Secondary | ICD-10-CM | POA: Diagnosis not present

## 2017-12-27 DIAGNOSIS — K589 Irritable bowel syndrome without diarrhea: Secondary | ICD-10-CM | POA: Diagnosis not present

## 2017-12-27 DIAGNOSIS — I10 Essential (primary) hypertension: Secondary | ICD-10-CM | POA: Insufficient documentation

## 2017-12-27 DIAGNOSIS — I4510 Unspecified right bundle-branch block: Secondary | ICD-10-CM | POA: Insufficient documentation

## 2017-12-27 DIAGNOSIS — Z79899 Other long term (current) drug therapy: Secondary | ICD-10-CM | POA: Insufficient documentation

## 2017-12-27 DIAGNOSIS — K624 Stenosis of anus and rectum: Secondary | ICD-10-CM | POA: Diagnosis not present

## 2017-12-27 HISTORY — PX: IR FLUORO GUIDE CV LINE RIGHT: IMG2283

## 2017-12-27 HISTORY — PX: IR US GUIDE VASC ACCESS RIGHT: IMG2390

## 2017-12-27 LAB — BASIC METABOLIC PANEL
Anion gap: 7 (ref 5–15)
BUN: 37 mg/dL — ABNORMAL HIGH (ref 6–20)
CALCIUM: 9.4 mg/dL (ref 8.9–10.3)
CHLORIDE: 106 mmol/L (ref 101–111)
CO2: 19 mmol/L — AB (ref 22–32)
CREATININE: 1.78 mg/dL — AB (ref 0.61–1.24)
GFR calc Af Amer: 40 mL/min — ABNORMAL LOW (ref >60)
GFR calc non Af Amer: 34 mL/min — ABNORMAL LOW (ref >60)
GLUCOSE: 108 mg/dL — AB (ref 65–99)
Potassium: 4.7 mmol/L (ref 3.5–5.1)
Sodium: 132 mmol/L — ABNORMAL LOW (ref 135–145)

## 2017-12-27 LAB — CBC
HCT: 24.9 % — ABNORMAL LOW (ref 39.0–52.0)
Hemoglobin: 8.6 g/dL — ABNORMAL LOW (ref 13.0–17.0)
MCH: 30.8 pg (ref 26.0–34.0)
MCHC: 34.5 g/dL (ref 30.0–36.0)
MCV: 89.2 fL (ref 78.0–100.0)
PLATELETS: 73 10*3/uL — AB (ref 150–400)
RBC: 2.79 MIL/uL — AB (ref 4.22–5.81)
RDW: 15.6 % — ABNORMAL HIGH (ref 11.5–15.5)
WBC: 6.2 10*3/uL (ref 4.0–10.5)

## 2017-12-27 LAB — PROTIME-INR
INR: 1.13
PROTHROMBIN TIME: 14.4 s (ref 11.4–15.2)

## 2017-12-27 LAB — APTT: aPTT: 33 seconds (ref 24–36)

## 2017-12-27 MED ORDER — SODIUM CHLORIDE 0.9 % IV SOLN
INTRAVENOUS | Status: DC
  Administered 2017-12-27: 13:00:00 via INTRAVENOUS

## 2017-12-27 MED ORDER — LIDOCAINE-EPINEPHRINE (PF) 2 %-1:200000 IJ SOLN
INTRAMUSCULAR | Status: AC | PRN
  Administered 2017-12-27: 20 mL

## 2017-12-27 MED ORDER — LIDOCAINE-EPINEPHRINE (PF) 2 %-1:200000 IJ SOLN
INTRAMUSCULAR | Status: AC
  Filled 2017-12-27: qty 20

## 2017-12-27 MED ORDER — HEPARIN SOD (PORK) LOCK FLUSH 100 UNIT/ML IV SOLN
INTRAVENOUS | Status: AC
  Filled 2017-12-27: qty 5

## 2017-12-27 MED ORDER — MIDAZOLAM HCL 2 MG/2ML IJ SOLN
INTRAMUSCULAR | Status: AC
  Filled 2017-12-27: qty 4

## 2017-12-27 MED ORDER — KETOROLAC TROMETHAMINE 30 MG/ML IJ SOLN
INTRAMUSCULAR | Status: AC
  Filled 2017-12-27: qty 1

## 2017-12-27 MED ORDER — CEFAZOLIN SODIUM-DEXTROSE 2-4 GM/100ML-% IV SOLN
INTRAVENOUS | Status: AC
  Filled 2017-12-27: qty 100

## 2017-12-27 MED ORDER — FENTANYL CITRATE (PF) 100 MCG/2ML IJ SOLN
INTRAMUSCULAR | Status: AC | PRN
  Administered 2017-12-27 (×2): 50 ug via INTRAVENOUS

## 2017-12-27 MED ORDER — CEFAZOLIN SODIUM-DEXTROSE 2-4 GM/100ML-% IV SOLN
2.0000 g | INTRAVENOUS | Status: AC
  Administered 2017-12-27: 2 g via INTRAVENOUS

## 2017-12-27 MED ORDER — KETOROLAC TROMETHAMINE 15 MG/ML IJ SOLN
15.0000 mg | Freq: Once | INTRAMUSCULAR | Status: DC
  Filled 2017-12-27: qty 1

## 2017-12-27 MED ORDER — FENTANYL CITRATE (PF) 100 MCG/2ML IJ SOLN
INTRAMUSCULAR | Status: AC
  Filled 2017-12-27: qty 2

## 2017-12-27 MED ORDER — MIDAZOLAM HCL 2 MG/2ML IJ SOLN
INTRAMUSCULAR | Status: AC | PRN
  Administered 2017-12-27 (×2): 1 mg via INTRAVENOUS

## 2017-12-27 NOTE — Procedures (Signed)
Pre Procedure Dx: Bladder Cancer Post Procedural Dx: Same  Successful placement of right IJ approach port-a-cath with tip at the superior caval atrial junction. The catheter is ready for immediate use.  Estimated Blood Loss: Minimal  Complications: None immediate.  Jay Laken Rog, MD Pager #: [phone]   

## 2017-12-27 NOTE — Discharge Instructions (Signed)
Implanted Port Home Guide °An implanted port is a type of central line that is placed under the skin. Central lines are used to provide IV access when treatment or nutrition needs to be given through a person’s veins. Implanted ports are used for long-term IV access. An implanted port may be placed because: °· You need IV medicine that would be irritating to the small veins in your hands or arms. °· You need long-term IV medicines, such as antibiotics. °· You need IV nutrition for a long period. °· You need frequent blood draws for lab tests. °· You need dialysis. ° °Implanted ports are usually placed in the chest area, but they can also be placed in the upper arm, the abdomen, or the leg. An implanted port has two main parts: °· Reservoir. The reservoir is round and will appear as a small, raised area under your skin. The reservoir is the part where a needle is inserted to give medicines or draw blood. °· Catheter. The catheter is a thin, flexible tube that extends from the reservoir. The catheter is placed into a large vein. Medicine that is inserted into the reservoir goes into the catheter and then into the vein. ° °How will I care for my incision site? °Do not get the incision site wet. Bathe or shower as directed by your health care provider. °How is my port accessed? °Special steps must be taken to access the port: °· Before the port is accessed, a numbing cream can be placed on the skin. This helps numb the skin over the port site. °· Your health care provider uses a sterile technique to access the port. °? Your health care provider must put on a mask and sterile gloves. °? The skin over your port is cleaned carefully with an antiseptic and allowed to dry. °? The port is gently pinched between sterile gloves, and a needle is inserted into the port. °· Only "non-coring" port needles should be used to access the port. Once the port is accessed, a blood return should be checked. This helps ensure that the port  is in the vein and is not clogged. °· If your port needs to remain accessed for a constant infusion, a clear (transparent) bandage will be placed over the needle site. The bandage and needle will need to be changed every week, or as directed by your health care provider. °· Keep the bandage covering the needle clean and dry. Do not get it wet. Follow your health care provider’s instructions on how to take a shower or bath while the port is accessed. °· If your port does not need to stay accessed, no bandage is needed over the port. ° °What is flushing? °Flushing helps keep the port from getting clogged. Follow your health care provider’s instructions on how and when to flush the port. Ports are usually flushed with saline solution or a medicine called heparin. The need for flushing will depend on how the port is used. °· If the port is used for intermittent medicines or blood draws, the port will need to be flushed: °? After medicines have been given. °? After blood has been drawn. °? As part of routine maintenance. °· If a constant infusion is running, the port may not need to be flushed. ° °How long will my port stay implanted? °The port can stay in for as long as your health care provider thinks it is needed. When it is time for the port to come out, surgery will be   done to remove it. The procedure is similar to the one performed when the port was put in. °When should I seek immediate medical care? °When you have an implanted port, you should seek immediate medical care if: °· You notice a bad smell coming from the incision site. °· You have swelling, redness, or drainage at the incision site. °· You have more swelling or pain at the port site or the surrounding area. °· You have a fever that is not controlled with medicine. ° °This information is not intended to replace advice given to you by your health care provider. Make sure you discuss any questions you have with your health care provider. °Document  Released: 12/14/2005 Document Revised: 05/21/2016 Document Reviewed: 08/21/2013 °Elsevier Interactive Patient Education © 2017 [employer] °Moderate Conscious Sedation, Adult, Care After °These instructions provide you with information about caring for yourself after your procedure. Your health care provider may also give you more specific instructions. Your treatment has been planned according to current medical practices, but problems sometimes occur. Call your health care provider if you have any problems or questions after your procedure. °What can I expect after the procedure? °After your procedure, it is common: °· To feel sleepy for several hours. °· To feel clumsy and have poor balance for several hours. °· To have poor judgment for several hours. °· To vomit if you eat too soon. ° °Follow these instructions at home: °For at least 24 hours after the procedure: ° °· Do not: °? Participate in activities where you could fall or become injured. °? Drive. °? Use heavy machinery. °? Drink alcohol. °? Take sleeping pills or medicines that cause drowsiness. °? Make important decisions or sign legal documents. °? Take care of children on your own. °· Rest. °Eating and drinking °· Follow the diet recommended by your health care provider. °· If you vomit: °? Drink water, juice, or soup when you can drink without vomiting. °? Make sure you have little or no nausea before eating solid foods. °General instructions °· Have a responsible adult stay with you until you are awake and alert. °· Take over-the-counter and prescription medicines only as told by your health care provider. °· If you smoke, do not smoke without supervision. °· Keep all follow-up visits as told by your health care provider. This is important. °Contact a health care provider if: °· You keep feeling nauseous or you keep vomiting. °· You feel light-headed. °· You develop a rash. °· You have a fever. °Get help right away if: °· You have trouble  breathing. °This information is not intended to replace advice given to you by your health care provider. Make sure you discuss any questions you have with your health care provider. °Document Released: 10/04/2013 Document Revised: 05/18/2016 Document Reviewed: 04/04/2016 °Elsevier Interactive Patient Education © 2018 [employer]. ° °

## 2017-12-27 NOTE — H&P (Signed)
Chief Complaint: urothelial cancer  Referring Physician:Dr. Zola Button  Supervising Physician: Sandi Mariscal  Patient Status: [facility] - Out-pt  HPI: Charles Coffey is a 82 y.o. male known to the IR service for recent placement of B PCNs secondary to ureteral obstructions from bladder cancer.  He presents today for placement of a PAC for treatment.  He has had no change in his medical history since we saw him last.  He had complained of pain on the right side secondary to his PCN.  This has resolved since he had this repositioned and exchanged on Friday.  He does still complain of a sensation like at times his bladder is full or he has urine running down his leg and he doesn't.  Otherwise, no fevers, chills, HA, CP, or SOB.  Past Medical History:  Past Medical History:  Diagnosis Date  . Aortic atherosclerosis ([place])   . BPH with urinary obstruction   . CAD (coronary artery disease)    a.  MI 1995, CABG x 3 2002 (patient says that he had LIMA and RIMA grafts). b. ETT-Cardiolite (10/15) with EF 48%, apical scar, no ischemia. c. Nuc 07/2017 abnormal -> cath was performed,  patent LIMA to LAD and SVG to OM 2. RIMA to RCA is atretic. The right coronary artery is occluded distally with extensive collaterals from the LAD, medical therapy.   . Cervical spondylosis without myelopathy 22-Apr-202017  . Chronic low back pain    sees Dr. Kary Kos   . GERD (gastroesophageal reflux disease)   . Hiatal hernia   . Hyperlipidemia   . Hypertension   . IBS (irritable bowel syndrome)   . Ischemic cardiomyopathy   . Myocardial infarction ([place])   . RBBB   . Restless legs syndrome   . Statin intolerance   . Syncope    a. in 2015 - no apparent cause, was taking sleep medicine at the time. Cardiac workup unremarkable.  . Tubular adenoma of colon     Past Surgical History:  Past Surgical History:  Procedure Laterality Date  . COLONOSCOPY  10/17/2014   per Dr. Hilarie Fredrickson, tubular adenomas, repeat in 3 yrs     . COLONOSCOPY WITH PROPOFOL N/A 12/01/2017   Procedure: COLONOSCOPY WITH PROPOFOL;  Surgeon: Milus Banister, MD;  Location: [facility];  Service: Endoscopy;  Laterality: N/A;  . CYSTOSCOPY W/ RETROGRADES Left 11/25/2017   Procedure: CYSTOSCOPY WITH RETROGRADE PYELOGRAM;  Surgeon: Irine Seal, MD;  Location: [facility] ORS;  Service: Urology;  Laterality: Left;  . HEART BYPASS    . IR NEPHROSTOGRAM LEFT THRU EXISTING ACCESS  12/24/2017  . IR NEPHROSTOMY EXCHANGE RIGHT  12/24/2017  . IR NEPHROSTOMY PLACEMENT LEFT  11/28/2017  . IR NEPHROSTOMY PLACEMENT RIGHT  12/03/2017  . LAPAROSCOPY N/A 12/01/2017   Procedure: LAPAROSCOPIC DIVERTING OSTOMY;  Surgeon: Stark Klein, MD;  Location: [facility];  Service: General;  Laterality: N/A;  . LEFT HEART CATH AND CORS/GRAFTS ANGIOGRAPHY N/A 08/25/2017   Procedure: LEFT HEART CATH AND CORS/GRAFTS ANGIOGRAPHY;  Surgeon: Wellington Hampshire, MD;  Location: [place] [facility];  Service: Cardiovascular;  Laterality: N/A;  . LUMBAR FUSION  2003   L3-L4  . PROSTATE SURGERY  06-27-12   per Dr. Roni Bread, had CTT  . TONSILLECTOMY    . TRANSURETHRAL RESECTION OF BLADDER TUMOR N/A 11/25/2017   Procedure: TRANSURETHRAL RESECTION OF BLADDER TUMOR (TURBT);  Surgeon: Irine Seal, MD;  Location: [facility];  Service: Urology;  Laterality: N/A;    Family History:  Family History  Problem Relation Age of Onset  . Heart disease Mother   . Heart attack Mother   . Aneurysm Father        femoral artery  . Heart disease Brother   . Heart disease Maternal Uncle        x 2  . Colon cancer Neg Hx   . Esophageal cancer Neg Hx   . Pancreatic cancer Neg Hx   . Kidney disease Neg Hx   . Liver disease Neg Hx     Social History:  reports that he quit smoking about 39 years ago. His smoking use included cigarettes. he has never used smokeless tobacco. He reports that he drinks alcohol. He reports that he does not use drugs.  Allergies:  Allergies  Allergen Reactions  . Antihistamines,  Loratadine-Type Other (See Comments)    Unable to urinate  . Statins Other (See Comments)    liver effects    Medications: Medications reviewed in epic  Please HPI for pertinent positives, otherwise complete 10 system ROS negative.  Mallampati Score: MD Evaluation Airway: WNL Heart: WNL Abdomen: Other (comments) Abdomen comments: B PCNs in place Chest/ Lungs: WNL ASA  Classification: 3 Mallampati/Airway Score: Two  Physical Exam: BP 109/65   Pulse 62   Temp 98.2 F (36.8 C) (Oral)   Resp 16   Ht 5' 8.5" (1.74 m)   Wt 188 lb (85.3 kg)   SpO2 99%   BMI 28.17 kg/m  Body mass index is 28.17 kg/m. General: pleasant, WD, WN white male who is laying in bed in NAD HEENT: head is normocephalic, atraumatic.  Sclera are noninjected.  PERRL.  Ears and nose without any masses or lesions.  Mouth is pink and moist Heart: regular, rate, and rhythm.  Normal s1,s2. No obvious murmurs, gallops, or rubs noted.  Palpable radial pulses bilaterally Lungs: CTAB, no wheezes, rhonchi, or rales noted.  Respiratory effort nonlabored Abd: soft, NT, ND, +BS, no masses, hernias, or organomegaly.  B PCNs in place with clear yellow urine.  Both drain sites are c/d/i.  Slight irritation around right sided drain, but no evidence of infection. Psych: A&Ox3 with an appropriate affect.   Labs: Results for orders placed or performed during the hospital encounter of 12/27/17 (from the past 48 hour(s))  APTT     Status: None   Collection Time: 12/27/17 12:27 PM  Result Value Ref Range   aPTT 33 24 - 36 seconds  Basic metabolic panel     Status: Abnormal   Collection Time: 12/27/17 12:27 PM  Result Value Ref Range   Sodium 132 (L) 135 - 145 mmol/L   Potassium 4.7 3.5 - 5.1 mmol/L   Chloride 106 101 - 111 mmol/L   CO2 19 (L) 22 - 32 mmol/L   Glucose, Bld 108 (H) 65 - 99 mg/dL   BUN 37 (H) 6 - 20 mg/dL   Creatinine, Ser 1.78 (H) 0.61 - 1.24 mg/dL   Calcium 9.4 8.9 - 10.3 mg/dL   GFR calc non Af Amer 34  (L) >60 mL/min   GFR calc Af Amer 40 (L) >60 mL/min    Comment: (NOTE) The eGFR has been calculated using the CKD EPI equation. This calculation has not been validated in all clinical situations. eGFR's persistently <60 mL/min signify possible Chronic Kidney Disease.    Anion gap 7 5 - 15  CBC     Status: Abnormal   Collection Time: 12/27/17 12:27 PM  Result Value Ref Range  WBC 6.2 4.0 - 10.5 K/uL   RBC 2.79 (L) 4.22 - 5.81 MIL/uL   Hemoglobin 8.6 (L) 13.0 - 17.0 g/dL   HCT 24.9 (L) 39.0 - 52.0 %   MCV 89.2 78.0 - 100.0 fL   MCH 30.8 26.0 - 34.0 pg   MCHC 34.5 30.0 - 36.0 g/dL   RDW 15.6 (H) 11.5 - 15.5 %   Platelets 73 (L) 150 - 400 K/uL    Comment: SPECIMEN CHECKED FOR CLOTS REPEATED TO VERIFY PLATELET COUNT CONFIRMED BY SMEAR   Protime-INR     Status: None   Collection Time: 12/27/17 12:27 PM  Result Value Ref Range   Prothrombin Time 14.4 11.4 - 15.2 seconds   INR 1.13     Imaging: No results found.  Assessment/Plan 1. Urothelial carcinoma  Plan to pursue placement of PAC today.  Labs and vitals reviewed.  Both PCNs are in place and working well.   Risks and benefits discussed with the patient including, but not limited to bleeding, infection, pneumothorax, or fibrin sheath development and need for additional procedures. All of the patient's questions were answered, patient is agreeable to proceed. Consent signed and in chart.   Thank you for this interesting consult.  I greatly enjoyed meeting Charles Coffey and look forward to participating in their care.  A copy of this report was sent to the requesting provider on this date.  Electronically Signed: Henreitta Cea 12/27/2017, 1:47 PM   I spent a total of    25 Minutes in face to face in clinical consultation, greater than 50% of which was counseling/coordinating care for urothelial carcinoma

## 2017-12-28 DIAGNOSIS — D126 Benign neoplasm of colon, unspecified: Secondary | ICD-10-CM | POA: Diagnosis not present

## 2017-12-28 DIAGNOSIS — K624 Stenosis of anus and rectum: Secondary | ICD-10-CM | POA: Diagnosis not present

## 2017-12-28 DIAGNOSIS — K589 Irritable bowel syndrome without diarrhea: Secondary | ICD-10-CM | POA: Diagnosis not present

## 2017-12-28 DIAGNOSIS — K5904 Chronic idiopathic constipation: Secondary | ICD-10-CM | POA: Diagnosis not present

## 2017-12-28 DIAGNOSIS — I2510 Atherosclerotic heart disease of native coronary artery without angina pectoris: Secondary | ICD-10-CM | POA: Diagnosis not present

## 2017-12-28 DIAGNOSIS — I255 Ischemic cardiomyopathy: Secondary | ICD-10-CM | POA: Diagnosis not present

## 2017-12-28 DIAGNOSIS — Z436 Encounter for attention to other artificial openings of urinary tract: Secondary | ICD-10-CM | POA: Diagnosis not present

## 2017-12-28 DIAGNOSIS — I252 Old myocardial infarction: Secondary | ICD-10-CM | POA: Diagnosis not present

## 2017-12-28 DIAGNOSIS — C679 Malignant neoplasm of bladder, unspecified: Secondary | ICD-10-CM | POA: Diagnosis not present

## 2017-12-28 DIAGNOSIS — Z433 Encounter for attention to colostomy: Secondary | ICD-10-CM | POA: Diagnosis not present

## 2017-12-29 ENCOUNTER — Encounter: Payer: Self-pay | Admitting: Oncology

## 2017-12-29 ENCOUNTER — Encounter: Payer: Self-pay | Admitting: Family Medicine

## 2017-12-29 ENCOUNTER — Other Ambulatory Visit (HOSPITAL_COMMUNITY): Payer: Medicare HMO

## 2017-12-29 ENCOUNTER — Telehealth: Payer: Self-pay | Admitting: Internal Medicine

## 2017-12-29 ENCOUNTER — Other Ambulatory Visit: Payer: Medicare HMO

## 2017-12-29 ENCOUNTER — Other Ambulatory Visit: Payer: Self-pay | Admitting: Family Medicine

## 2017-12-29 ENCOUNTER — Other Ambulatory Visit (HOSPITAL_BASED_OUTPATIENT_CLINIC_OR_DEPARTMENT_OTHER): Payer: Medicare HMO

## 2017-12-29 ENCOUNTER — Other Ambulatory Visit (HOSPITAL_COMMUNITY): Payer: Self-pay | Admitting: Interventional Radiology

## 2017-12-29 ENCOUNTER — Telehealth: Payer: Self-pay | Admitting: *Deleted

## 2017-12-29 DIAGNOSIS — C679 Malignant neoplasm of bladder, unspecified: Secondary | ICD-10-CM

## 2017-12-29 DIAGNOSIS — Z8551 Personal history of malignant neoplasm of bladder: Secondary | ICD-10-CM

## 2017-12-29 LAB — COMPREHENSIVE METABOLIC PANEL
ALBUMIN: 3.8 g/dL (ref 3.5–5.0)
ALT: 13 U/L (ref 0–55)
ANION GAP: 8 meq/L (ref 3–11)
AST: 15 U/L (ref 5–34)
Alkaline Phosphatase: 61 U/L (ref 40–150)
BUN: 31.5 mg/dL — ABNORMAL HIGH (ref 7.0–26.0)
CALCIUM: 9.6 mg/dL (ref 8.4–10.4)
CHLORIDE: 105 meq/L (ref 98–109)
CO2: 20 mEq/L — ABNORMAL LOW (ref 22–29)
CREATININE: 1.6 mg/dL — AB (ref 0.7–1.3)
EGFR: 40 mL/min/{1.73_m2} — ABNORMAL LOW (ref >60)
Glucose: 102 mg/dl (ref 70–140)
Potassium: 4.4 mEq/L (ref 3.5–5.1)
Sodium: 133 mEq/L — ABNORMAL LOW (ref 136–145)
Total Bilirubin: 0.43 mg/dL (ref 0.20–1.20)
Total Protein: 6.8 g/dL (ref 6.4–8.3)

## 2017-12-29 LAB — CBC WITH DIFFERENTIAL/PLATELET
BASO%: 0.2 % (ref 0.0–2.0)
Basophils Absolute: 0 10*3/uL (ref 0.0–0.1)
EOS ABS: 0.2 10*3/uL (ref 0.0–0.5)
EOS%: 3.6 % (ref 0.0–7.0)
HCT: 26 % — ABNORMAL LOW (ref 38.4–49.9)
HGB: 8.8 g/dL — ABNORMAL LOW (ref 13.0–17.1)
LYMPH%: 33.3 % (ref 14.0–49.0)
MCH: 30.2 pg (ref 27.2–33.4)
MCHC: 33.8 g/dL (ref 32.0–36.0)
MCV: 89.3 fL (ref 79.3–98.0)
MONO#: 0.6 10*3/uL (ref 0.1–0.9)
MONO%: 15.3 % — AB (ref 0.0–14.0)
NEUT%: 47.6 % (ref 39.0–75.0)
NEUTROS ABS: 2 10*3/uL (ref 1.5–6.5)
NRBC: 0 % (ref 0–0)
PLATELETS: 70 10*3/uL — AB (ref 140–400)
RBC: 2.91 10*6/uL — AB (ref 4.20–5.82)
RDW: 15.5 % — AB (ref 11.0–14.6)
WBC: 4.2 10*3/uL (ref 4.0–10.3)
lymph#: 1.4 10*3/uL (ref 0.9–3.3)

## 2017-12-29 MED ORDER — DICYCLOMINE HCL 10 MG PO CAPS: ORAL_CAPSULE | ORAL | 0 refills | Status: DC

## 2017-12-29 NOTE — Telephone Encounter (Signed)
Agree with deferring bowel regimen medications to the oncologist as his therapy will likely affect his BMs Can try bentyl 10 mg, 1-2 PO every 6 hours as needed for rectal spasm.  If this is unhelpful, would have him ask Dr. [name] given his urothelial cancer that is invading the rectum.

## 2017-12-29 NOTE — Telephone Encounter (Signed)
Spoke with patient and wife, patient is having discomfort in rectal area and thinks his urostomy tubes are not functioning properly. Has oxycodone for discomfort, but does not like to take it. Per dr [name] he is to take his pain medication and contact IR for nephrostomy tube assessment. Wife verbalized understanding.

## 2017-12-29 NOTE — Telephone Encounter (Signed)
Last OV 12/09/2017. Pt is due for an OV. Sent to PCP for approval.

## 2017-12-29 NOTE — Telephone Encounter (Signed)
Left message for pt to call back.  Pt is starting chemo tomorrow and wanted to know if they should stop the linzess. Let them know they should check with the Oncologist as some chemo drugs can cause loose stools or diarrhea. Wife also states that the pt is c/o rectal spasms that are quite painful when he lays down. Oncologist instructed the pt to call GI regarding the spasms. Please advise.

## 2017-12-29 NOTE — Telephone Encounter (Signed)
Spoke with pts wife and she is aware. Prescription sent to pharmacy.

## 2017-12-30 ENCOUNTER — Ambulatory Visit (HOSPITAL_BASED_OUTPATIENT_CLINIC_OR_DEPARTMENT_OTHER): Payer: Medicare HMO

## 2017-12-30 ENCOUNTER — Encounter: Payer: Self-pay | Admitting: Family Medicine

## 2017-12-30 VITALS — BP 107/61 | HR 63 | Temp 97.0°F | Resp 18

## 2017-12-30 DIAGNOSIS — C679 Malignant neoplasm of bladder, unspecified: Secondary | ICD-10-CM

## 2017-12-30 DIAGNOSIS — N401 Enlarged prostate with lower urinary tract symptoms: Secondary | ICD-10-CM | POA: Diagnosis not present

## 2017-12-30 DIAGNOSIS — N3000 Acute cystitis without hematuria: Secondary | ICD-10-CM | POA: Diagnosis not present

## 2017-12-30 DIAGNOSIS — Z5111 Encounter for antineoplastic chemotherapy: Secondary | ICD-10-CM | POA: Diagnosis not present

## 2017-12-30 DIAGNOSIS — R3914 Feeling of incomplete bladder emptying: Secondary | ICD-10-CM | POA: Diagnosis not present

## 2017-12-30 DIAGNOSIS — C670 Malignant neoplasm of trigone of bladder: Secondary | ICD-10-CM | POA: Diagnosis not present

## 2017-12-30 DIAGNOSIS — R3915 Urgency of urination: Secondary | ICD-10-CM | POA: Diagnosis not present

## 2017-12-30 MED ORDER — CARBOPLATIN CHEMO INJECTION 450 MG/45ML
348.0000 mg | Freq: Once | INTRAVENOUS | Status: AC
  Administered 2017-12-30: 350 mg via INTRAVENOUS
  Filled 2017-12-30: qty 35

## 2017-12-30 MED ORDER — PALONOSETRON HCL INJECTION 0.25 MG/5ML
0.2500 mg | Freq: Once | INTRAVENOUS | Status: AC
  Administered 2017-12-30: 0.25 mg via INTRAVENOUS

## 2017-12-30 MED ORDER — SODIUM CHLORIDE 0.9% FLUSH
10.0000 mL | INTRAVENOUS | Status: DC | PRN
  Administered 2017-12-30: 10 mL
  Filled 2017-12-30: qty 10

## 2017-12-30 MED ORDER — SODIUM CHLORIDE 0.9 % IV SOLN
Freq: Once | INTRAVENOUS | Status: AC
  Administered 2017-12-30: 09:00:00 via INTRAVENOUS

## 2017-12-30 MED ORDER — PALONOSETRON HCL INJECTION 0.25 MG/5ML
INTRAVENOUS | Status: AC
  Filled 2017-12-30: qty 5

## 2017-12-30 MED ORDER — HEPARIN SOD (PORK) LOCK FLUSH 100 UNIT/ML IV SOLN
500.0000 [IU] | Freq: Once | INTRAVENOUS | Status: AC | PRN
  Administered 2017-12-30: 500 [IU]
  Filled 2017-12-30: qty 5

## 2017-12-30 MED ORDER — SODIUM CHLORIDE 0.9 % IV SOLN
Freq: Once | INTRAVENOUS | Status: AC
  Administered 2017-12-30: 09:00:00 via INTRAVENOUS
  Filled 2017-12-30: qty 5

## 2017-12-30 MED ORDER — SODIUM CHLORIDE 0.9 % IV SOLN
2000.0000 mg | Freq: Once | INTRAVENOUS | Status: AC
  Administered 2017-12-30: 2000 mg via INTRAVENOUS
  Filled 2017-12-30: qty 52.6

## 2017-12-30 NOTE — Patient Instructions (Addendum)
[facility] Discharge Instructions for Patients Receiving Chemotherapy  Today you received the following chemotherapy agents Gemzar and Carboplatin.  To help prevent nausea and vomiting after your treatment, we encourage you to take your nausea medication as directed.   If you develop nausea and vomiting that is not controlled by your nausea medication, call the clinic.   BELOW ARE SYMPTOMS THAT SHOULD BE REPORTED IMMEDIATELY:  *FEVER GREATER THAN 100.5 F  *CHILLS WITH OR WITHOUT FEVER  NAUSEA AND VOMITING THAT IS NOT CONTROLLED WITH YOUR NAUSEA MEDICATION  *UNUSUAL SHORTNESS OF BREATH  *UNUSUAL BRUISING OR BLEEDING  TENDERNESS IN MOUTH AND THROAT WITH OR WITHOUT PRESENCE OF ULCERS  *URINARY PROBLEMS  *BOWEL PROBLEMS  UNUSUAL RASH Items with * indicate a potential emergency and should be followed up as soon as possible.  Feel free to call the clinic should you have any questions or concerns. The clinic phone number is [phone].  Please show the CHEMO ALERT CARD at check-in to the Emergency Department and triage nurse.  Gemcitabine injection What is this medicine? GEMCITABINE (jem SIT a been) is a chemotherapy drug. This medicine is used to treat many types of cancer like breast cancer, lung cancer, pancreatic cancer, and ovarian cancer. This medicine may be used for other purposes; ask your health care provider or pharmacist if you have questions. COMMON BRAND NAME(S): Gemzar What should I tell my health care provider before I take this medicine? They need to know if you have any of these conditions: -blood disorders -infection -kidney disease -liver disease -recent or ongoing radiation therapy -an unusual or allergic reaction to gemcitabine, other chemotherapy, other medicines, foods, dyes, or preservatives -pregnant or trying to get pregnant -breast-feeding How should I use this medicine? This drug is given as an infusion into a vein. It is  administered in a hospital or clinic by a specially trained health care professional. Talk to your pediatrician regarding the use of this medicine in children. Special care may be needed. Overdosage: If you think you have taken too much of this medicine contact a poison control center or emergency room at once. NOTE: This medicine is only for you. Do not share this medicine with others. What if I miss a dose? It is important not to miss your dose. Call your doctor or health care professional if you are unable to keep an appointment. What may interact with this medicine? -medicines to increase blood counts like filgrastim, pegfilgrastim, sargramostim -some other chemotherapy drugs like cisplatin -vaccines Talk to your doctor or health care professional before taking any of these medicines: -acetaminophen -aspirin -ibuprofen -ketoprofen -naproxen This list may not describe all possible interactions. Give your health care provider a list of all the medicines, herbs, non-prescription drugs, or dietary supplements you use. Also tell them if you smoke, drink alcohol, or use illegal drugs. Some items may interact with your medicine. What should I watch for while using this medicine? Visit your doctor for checks on your progress. This drug may make you feel generally unwell. This is not uncommon, as chemotherapy can affect healthy cells as well as cancer cells. Report any side effects. Continue your course of treatment even though you feel ill unless your doctor tells you to stop. In some cases, you may be given additional medicines to help with side effects. Follow all directions for their use. Call your doctor or health care professional for advice if you get a fever, chills or sore throat, or other symptoms of a   cold or flu. Do not treat yourself. This drug decreases your body's ability to fight infections. Try to avoid being around people who are sick. This medicine may increase your risk to bruise  or bleed. Call your doctor or health care professional if you notice any unusual bleeding. Be careful brushing and flossing your teeth or using a toothpick because you may get an infection or bleed more easily. If you have any dental work done, tell your dentist you are receiving this medicine. Avoid taking products that contain aspirin, acetaminophen, ibuprofen, naproxen, or ketoprofen unless instructed by your doctor. These medicines may hide a fever. Women should inform their doctor if they wish to become pregnant or think they might be pregnant. There is a potential for serious side effects to an unborn child. Talk to your health care professional or pharmacist for more information. Do not breast-feed an infant while taking this medicine. What side effects may I notice from receiving this medicine? Side effects that you should report to your doctor or health care professional as soon as possible: -allergic reactions like skin rash, itching or hives, swelling of the face, lips, or tongue -low blood counts - this medicine may decrease the number of white blood cells, red blood cells and platelets. You may be at increased risk for infections and bleeding. -signs of infection - fever or chills, cough, sore throat, pain or difficulty passing urine -signs of decreased platelets or bleeding - bruising, pinpoint red spots on the skin, black, tarry stools, blood in the urine -signs of decreased red blood cells - unusually weak or tired, fainting spells, lightheadedness -breathing problems -chest pain -mouth sores -nausea and vomiting -pain, swelling, redness at site where injected -pain, tingling, numbness in the hands or feet -stomach pain -swelling of ankles, feet, hands -unusual bleeding Side effects that usually do not require medical attention (report to your doctor or health care professional if they continue or are bothersome): -constipation -diarrhea -hair loss -loss of appetite -stomach  upset This list may not describe all possible side effects. Call your doctor for medical advice about side effects. You may report side effects to FDA at 1-800-FDA-1088. Where should I keep my medicine? This drug is given in a hospital or clinic and will not be stored at home. NOTE: This sheet is a summary. It may not cover all possible information. If you have questions about this medicine, talk to your doctor, pharmacist, or health care provider.  2018 Elsevier/Gold Standard (2008-04-24 18:45:54)   Carboplatin injection What is this medicine? CARBOPLATIN (KAR boe pla tin) is a chemotherapy drug. It targets fast dividing cells, like cancer cells, and causes these cells to die. This medicine is used to treat ovarian cancer and many other cancers. This medicine may be used for other purposes; ask your health care provider or pharmacist if you have questions. COMMON BRAND NAME(S): Paraplatin What should I tell my health care provider before I take this medicine? They need to know if you have any of these conditions: -blood disorders -hearing problems -kidney disease -recent or ongoing radiation therapy -an unusual or allergic reaction to carboplatin, cisplatin, other chemotherapy, other medicines, foods, dyes, or preservatives -pregnant or trying to get pregnant -breast-feeding How should I use this medicine? This drug is usually given as an infusion into a vein. It is administered in a hospital or clinic by a specially trained health care professional. Talk to your pediatrician regarding the use of this medicine in children. Special care may be needed.   Overdosage: If you think you have taken too much of this medicine contact a poison control center or emergency room at once. NOTE: This medicine is only for you. Do not share this medicine with others. What if I miss a dose? It is important not to miss a dose. Call your doctor or health care professional if you are unable to keep an  appointment. What may interact with this medicine? -medicines for seizures -medicines to increase blood counts like filgrastim, pegfilgrastim, sargramostim -some antibiotics like amikacin, gentamicin, neomycin, streptomycin, tobramycin -vaccines Talk to your doctor or health care professional before taking any of these medicines: -acetaminophen -aspirin -ibuprofen -ketoprofen -naproxen This list may not describe all possible interactions. Give your health care provider a list of all the medicines, herbs, non-prescription drugs, or dietary supplements you use. Also tell them if you smoke, drink alcohol, or use illegal drugs. Some items may interact with your medicine. What should I watch for while using this medicine? Your condition will be monitored carefully while you are receiving this medicine. You will need important blood work done while you are taking this medicine. This drug may make you feel generally unwell. This is not uncommon, as chemotherapy can affect healthy cells as well as cancer cells. Report any side effects. Continue your course of treatment even though you feel ill unless your doctor tells you to stop. In some cases, you may be given additional medicines to help with side effects. Follow all directions for their use. Call your doctor or health care professional for advice if you get a fever, chills or sore throat, or other symptoms of a cold or flu. Do not treat yourself. This drug decreases your body's ability to fight infections. Try to avoid being around people who are sick. This medicine may increase your risk to bruise or bleed. Call your doctor or health care professional if you notice any unusual bleeding. Be careful brushing and flossing your teeth or using a toothpick because you may get an infection or bleed more easily. If you have any dental work done, tell your dentist you are receiving this medicine. Avoid taking products that contain aspirin, acetaminophen,  ibuprofen, naproxen, or ketoprofen unless instructed by your doctor. These medicines may hide a fever. Do not become pregnant while taking this medicine. Women should inform their doctor if they wish to become pregnant or think they might be pregnant. There is a potential for serious side effects to an unborn child. Talk to your health care professional or pharmacist for more information. Do not breast-feed an infant while taking this medicine. What side effects may I notice from receiving this medicine? Side effects that you should report to your doctor or health care professional as soon as possible: -allergic reactions like skin rash, itching or hives, swelling of the face, lips, or tongue -signs of infection - fever or chills, cough, sore throat, pain or difficulty passing urine -signs of decreased platelets or bleeding - bruising, pinpoint red spots on the skin, black, tarry stools, nosebleeds -signs of decreased red blood cells - unusually weak or tired, fainting spells, lightheadedness -breathing problems -changes in hearing -changes in vision -chest pain -high blood pressure -low blood counts - This drug may decrease the number of white blood cells, red blood cells and platelets. You may be at increased risk for infections and bleeding. -nausea and vomiting -pain, swelling, redness or irritation at the injection site -pain, tingling, numbness in the hands or feet -problems with balance, talking, walking -trouble   passing urine or change in the amount of urine Side effects that usually do not require medical attention (report to your doctor or health care professional if they continue or are bothersome): -hair loss -loss of appetite -metallic taste in the mouth or changes in taste This list may not describe all possible side effects. Call your doctor for medical advice about side effects. You may report side effects to FDA at 1-800-FDA-1088. Where should I keep my medicine? This drug  is given in a hospital or clinic and will not be stored at home. NOTE: This sheet is a summary. It may not cover all possible information. If you have questions about this medicine, talk to your doctor, pharmacist, or health care provider.  2018 Elsevier/Gold Standard (2008-03-20 14:38:05)    

## 2017-12-30 NOTE — Progress Notes (Signed)
Pt reports rectal spasms that was evaluated by MD 12/29/17, pt states that home medication is mildly effective. Platelets 70, no s/s of bleeding, per Dr. [name] okay to proceed with treatment. Patient's wife states that they were under the impression that they could not use port for two weeks. Clarified with Juliann Pulse in [employer], per Juliann Pulse okay to use port today, pt aware and verbalizes understanding.  Pt reports that he feels like his nephrostomy tube is not draining appropriately, Per Dixie RN per Dr. [name] pt to consult with IR, pt and patient's wife verbalizes understanding.  Pt tolerated treatment well. Discharge instructions printed and verbally reviewed. Pt verbalizes understanding and stable at discharge.

## 2017-12-31 ENCOUNTER — Telehealth: Payer: Self-pay | Admitting: Family Medicine

## 2017-12-31 DIAGNOSIS — K624 Stenosis of anus and rectum: Secondary | ICD-10-CM | POA: Diagnosis not present

## 2017-12-31 DIAGNOSIS — D126 Benign neoplasm of colon, unspecified: Secondary | ICD-10-CM | POA: Diagnosis not present

## 2017-12-31 DIAGNOSIS — K589 Irritable bowel syndrome without diarrhea: Secondary | ICD-10-CM | POA: Diagnosis not present

## 2017-12-31 DIAGNOSIS — I255 Ischemic cardiomyopathy: Secondary | ICD-10-CM | POA: Diagnosis not present

## 2017-12-31 DIAGNOSIS — C679 Malignant neoplasm of bladder, unspecified: Secondary | ICD-10-CM | POA: Diagnosis not present

## 2017-12-31 DIAGNOSIS — I2510 Atherosclerotic heart disease of native coronary artery without angina pectoris: Secondary | ICD-10-CM | POA: Diagnosis not present

## 2017-12-31 DIAGNOSIS — Z433 Encounter for attention to colostomy: Secondary | ICD-10-CM | POA: Diagnosis not present

## 2017-12-31 DIAGNOSIS — Z436 Encounter for attention to other artificial openings of urinary tract: Secondary | ICD-10-CM | POA: Diagnosis not present

## 2017-12-31 DIAGNOSIS — I252 Old myocardial infarction: Secondary | ICD-10-CM | POA: Diagnosis not present

## 2017-12-31 DIAGNOSIS — K5904 Chronic idiopathic constipation: Secondary | ICD-10-CM | POA: Diagnosis not present

## 2017-12-31 NOTE — Telephone Encounter (Signed)
Please continue [facility] nursing

## 2017-12-31 NOTE — Telephone Encounter (Signed)
Sent to PCP for the OK to give Verbal Orders

## 2017-12-31 NOTE — Telephone Encounter (Signed)
Call in #120 with 5 rf 

## 2017-12-31 NOTE — Telephone Encounter (Signed)
[facility] [phone] requesting to continue with home health nursing.

## 2018-01-01 ENCOUNTER — Telehealth: Payer: Self-pay | Admitting: Hematology

## 2018-01-01 ENCOUNTER — Other Ambulatory Visit: Payer: Self-pay

## 2018-01-01 ENCOUNTER — Encounter (HOSPITAL_COMMUNITY): Payer: Self-pay | Admitting: Emergency Medicine

## 2018-01-01 ENCOUNTER — Emergency Department (HOSPITAL_COMMUNITY): Payer: Medicare HMO

## 2018-01-01 ENCOUNTER — Inpatient Hospital Stay (HOSPITAL_COMMUNITY)
Admission: EM | Admit: 2018-01-01 | Discharge: 2018-01-04 | DRG: 698 | Disposition: A | Discharge status: Home / Self Care | Payer: Medicare HMO | Attending: Family Medicine | Admitting: Family Medicine

## 2018-01-01 DIAGNOSIS — R0602 Shortness of breath: Secondary | ICD-10-CM | POA: Diagnosis not present

## 2018-01-01 DIAGNOSIS — N390 Urinary tract infection, site not specified: Secondary | ICD-10-CM | POA: Diagnosis not present

## 2018-01-01 DIAGNOSIS — D6481 Anemia due to antineoplastic chemotherapy: Secondary | ICD-10-CM | POA: Diagnosis not present

## 2018-01-01 DIAGNOSIS — Z936 Other artificial openings of urinary tract status: Secondary | ICD-10-CM | POA: Diagnosis not present

## 2018-01-01 DIAGNOSIS — C689 Malignant neoplasm of urinary organ, unspecified: Secondary | ICD-10-CM

## 2018-01-01 DIAGNOSIS — E871 Hypo-osmolality and hyponatremia: Secondary | ICD-10-CM | POA: Diagnosis present

## 2018-01-01 DIAGNOSIS — R609 Edema, unspecified: Secondary | ICD-10-CM | POA: Diagnosis not present

## 2018-01-01 DIAGNOSIS — A419 Sepsis, unspecified organism: Secondary | ICD-10-CM | POA: Diagnosis not present

## 2018-01-01 DIAGNOSIS — Z888 Allergy status to other drugs, medicaments and biological substances status: Secondary | ICD-10-CM

## 2018-01-01 DIAGNOSIS — D61818 Other pancytopenia: Secondary | ICD-10-CM | POA: Diagnosis present

## 2018-01-01 DIAGNOSIS — E785 Hyperlipidemia, unspecified: Secondary | ICD-10-CM | POA: Diagnosis present

## 2018-01-01 DIAGNOSIS — Z86010 Personal history of colonic polyps: Secondary | ICD-10-CM | POA: Diagnosis not present

## 2018-01-01 DIAGNOSIS — J90 Pleural effusion, not elsewhere classified: Secondary | ICD-10-CM | POA: Diagnosis not present

## 2018-01-01 DIAGNOSIS — T83518A Infection and inflammatory reaction due to other urinary catheter, initial encounter: Principal | ICD-10-CM | POA: Diagnosis present

## 2018-01-01 DIAGNOSIS — Z515 Encounter for palliative care: Secondary | ICD-10-CM | POA: Diagnosis present

## 2018-01-01 DIAGNOSIS — N509 Disorder of male genital organs, unspecified: Secondary | ICD-10-CM | POA: Diagnosis not present

## 2018-01-01 DIAGNOSIS — N136 Pyonephrosis: Secondary | ICD-10-CM | POA: Diagnosis present

## 2018-01-01 DIAGNOSIS — Z87891 Personal history of nicotine dependence: Secondary | ICD-10-CM | POA: Diagnosis not present

## 2018-01-01 DIAGNOSIS — R778 Other specified abnormalities of plasma proteins: Secondary | ICD-10-CM

## 2018-01-01 DIAGNOSIS — Z79899 Other long term (current) drug therapy: Secondary | ICD-10-CM | POA: Diagnosis not present

## 2018-01-01 DIAGNOSIS — C678 Malignant neoplasm of overlapping sites of bladder: Secondary | ICD-10-CM | POA: Diagnosis not present

## 2018-01-01 DIAGNOSIS — I255 Ischemic cardiomyopathy: Secondary | ICD-10-CM | POA: Diagnosis present

## 2018-01-01 DIAGNOSIS — I214 Non-ST elevation (NSTEMI) myocardial infarction: Secondary | ICD-10-CM | POA: Diagnosis not present

## 2018-01-01 DIAGNOSIS — N179 Acute kidney failure, unspecified: Secondary | ICD-10-CM | POA: Diagnosis not present

## 2018-01-01 DIAGNOSIS — K589 Irritable bowel syndrome without diarrhea: Secondary | ICD-10-CM | POA: Diagnosis present

## 2018-01-01 DIAGNOSIS — C786 Secondary malignant neoplasm of retroperitoneum and peritoneum: Secondary | ICD-10-CM | POA: Diagnosis not present

## 2018-01-01 DIAGNOSIS — D696 Thrombocytopenia, unspecified: Secondary | ICD-10-CM | POA: Diagnosis not present

## 2018-01-01 DIAGNOSIS — Y846 Urinary catheterization as the cause of abnormal reaction of the patient, or of later complication, without mention of misadventure at the time of the procedure: Secondary | ICD-10-CM | POA: Diagnosis present

## 2018-01-01 DIAGNOSIS — R0600 Dyspnea, unspecified: Secondary | ICD-10-CM | POA: Diagnosis not present

## 2018-01-01 DIAGNOSIS — Z8249 Family history of ischemic heart disease and other diseases of the circulatory system: Secondary | ICD-10-CM

## 2018-01-01 DIAGNOSIS — I82409 Acute embolism and thrombosis of unspecified deep veins of unspecified lower extremity: Secondary | ICD-10-CM

## 2018-01-01 DIAGNOSIS — I2510 Atherosclerotic heart disease of native coronary artery without angina pectoris: Secondary | ICD-10-CM | POA: Diagnosis present

## 2018-01-01 DIAGNOSIS — D649 Anemia, unspecified: Secondary | ICD-10-CM

## 2018-01-01 DIAGNOSIS — Z933 Colostomy status: Secondary | ICD-10-CM

## 2018-01-01 DIAGNOSIS — I252 Old myocardial infarction: Secondary | ICD-10-CM | POA: Diagnosis not present

## 2018-01-01 DIAGNOSIS — D6959 Other secondary thrombocytopenia: Secondary | ICD-10-CM | POA: Diagnosis not present

## 2018-01-01 DIAGNOSIS — I10 Essential (primary) hypertension: Secondary | ICD-10-CM | POA: Diagnosis present

## 2018-01-01 DIAGNOSIS — I130 Hypertensive heart and chronic kidney disease with heart failure and stage 1 through stage 4 chronic kidney disease, or unspecified chronic kidney disease: Secondary | ICD-10-CM | POA: Diagnosis present

## 2018-01-01 DIAGNOSIS — I82449 Acute embolism and thrombosis of unspecified tibial vein: Secondary | ICD-10-CM | POA: Diagnosis not present

## 2018-01-01 DIAGNOSIS — G2581 Restless legs syndrome: Secondary | ICD-10-CM | POA: Diagnosis present

## 2018-01-01 DIAGNOSIS — B9562 Methicillin resistant Staphylococcus aureus infection as the cause of diseases classified elsewhere: Secondary | ICD-10-CM | POA: Diagnosis present

## 2018-01-01 DIAGNOSIS — K219 Gastro-esophageal reflux disease without esophagitis: Secondary | ICD-10-CM | POA: Diagnosis present

## 2018-01-01 DIAGNOSIS — C679 Malignant neoplasm of bladder, unspecified: Secondary | ICD-10-CM | POA: Diagnosis not present

## 2018-01-01 DIAGNOSIS — C785 Secondary malignant neoplasm of large intestine and rectum: Secondary | ICD-10-CM | POA: Diagnosis not present

## 2018-01-01 DIAGNOSIS — N10 Acute tubulo-interstitial nephritis: Secondary | ICD-10-CM | POA: Diagnosis not present

## 2018-01-01 DIAGNOSIS — R509 Fever, unspecified: Secondary | ICD-10-CM | POA: Diagnosis not present

## 2018-01-01 DIAGNOSIS — I361 Nonrheumatic tricuspid (valve) insufficiency: Secondary | ICD-10-CM | POA: Diagnosis not present

## 2018-01-01 DIAGNOSIS — R7889 Finding of other specified substances, not normally found in blood: Secondary | ICD-10-CM | POA: Diagnosis not present

## 2018-01-01 DIAGNOSIS — R7989 Other specified abnormal findings of blood chemistry: Secondary | ICD-10-CM

## 2018-01-01 DIAGNOSIS — Z9221 Personal history of antineoplastic chemotherapy: Secondary | ICD-10-CM

## 2018-01-01 DIAGNOSIS — N183 Chronic kidney disease, stage 3 (moderate): Secondary | ICD-10-CM | POA: Diagnosis present

## 2018-01-01 DIAGNOSIS — N1330 Unspecified hydronephrosis: Secondary | ICD-10-CM | POA: Diagnosis not present

## 2018-01-01 DIAGNOSIS — N12 Tubulo-interstitial nephritis, not specified as acute or chronic: Secondary | ICD-10-CM

## 2018-01-01 DIAGNOSIS — I5021 Acute systolic (congestive) heart failure: Secondary | ICD-10-CM | POA: Diagnosis present

## 2018-01-01 DIAGNOSIS — Z951 Presence of aortocoronary bypass graft: Secondary | ICD-10-CM

## 2018-01-01 DIAGNOSIS — R109 Unspecified abdominal pain: Secondary | ICD-10-CM | POA: Diagnosis not present

## 2018-01-01 DIAGNOSIS — C7951 Secondary malignant neoplasm of bone: Secondary | ICD-10-CM | POA: Diagnosis not present

## 2018-01-01 HISTORY — DX: Malignant (primary) neoplasm, unspecified: C80.1

## 2018-01-01 LAB — COMPREHENSIVE METABOLIC PANEL
ALK PHOS: 47 U/L (ref 38–126)
ALT: 30 U/L (ref 17–63)
AST: 90 U/L — ABNORMAL HIGH (ref 15–41)
Albumin: 3.5 g/dL (ref 3.5–5.0)
Anion gap: 7 (ref 5–15)
BUN: 50 mg/dL — ABNORMAL HIGH (ref 6–20)
CALCIUM: 8.8 mg/dL — AB (ref 8.9–10.3)
CHLORIDE: 104 mmol/L (ref 101–111)
CO2: 18 mmol/L — ABNORMAL LOW (ref 22–32)
CREATININE: 1.66 mg/dL — AB (ref 0.61–1.24)
GFR, EST AFRICAN AMERICAN: 43 mL/min — AB (ref >60)
GFR, EST NON AFRICAN AMERICAN: 37 mL/min — AB (ref >60)
Glucose, Bld: 97 mg/dL (ref 65–99)
Potassium: 4.5 mmol/L (ref 3.5–5.1)
Sodium: 129 mmol/L — ABNORMAL LOW (ref 135–145)
Total Bilirubin: 1.3 mg/dL — ABNORMAL HIGH (ref 0.3–1.2)
Total Protein: 6.2 g/dL — ABNORMAL LOW (ref 6.5–8.1)

## 2018-01-01 LAB — CBC WITH DIFFERENTIAL/PLATELET
BASOS PCT: 0 %
Basophils Absolute: 0 10*3/uL (ref 0.0–0.1)
EOS ABS: 0 10*3/uL (ref 0.0–0.7)
EOS PCT: 0 %
HCT: 21.9 % — ABNORMAL LOW (ref 39.0–52.0)
Hemoglobin: 7.5 g/dL — ABNORMAL LOW (ref 13.0–17.0)
Lymphocytes Relative: 10 %
Lymphs Abs: 1.3 10*3/uL (ref 0.7–4.0)
MCH: 30.7 pg (ref 26.0–34.0)
MCHC: 34.2 g/dL (ref 30.0–36.0)
MCV: 89.8 fL (ref 78.0–100.0)
MONO ABS: 0.1 10*3/uL (ref 0.1–1.0)
Monocytes Relative: 1 %
NEUTROS PCT: 89 %
Neutro Abs: 11.1 10*3/uL — ABNORMAL HIGH (ref 1.7–7.7)
PLATELETS: 56 10*3/uL — AB (ref 150–400)
RBC: 2.44 MIL/uL — AB (ref 4.22–5.81)
RDW: 15.9 % — AB (ref 11.5–15.5)
WBC: 12.5 10*3/uL — AB (ref 4.0–10.5)

## 2018-01-01 LAB — URINALYSIS, ROUTINE W REFLEX MICROSCOPIC
Bilirubin Urine: NEGATIVE
GLUCOSE, UA: NEGATIVE mg/dL
Ketones, ur: NEGATIVE mg/dL
Nitrite: NEGATIVE
PH: 5 (ref 5.0–8.0)
Protein, ur: 100 mg/dL — AB
SPECIFIC GRAVITY, URINE: 1.02 (ref 1.005–1.030)
SQUAMOUS EPITHELIAL / LPF: NONE SEEN

## 2018-01-01 LAB — I-STAT CG4 LACTIC ACID, ED: Lactic Acid, Venous: 1.16 mmol/L (ref 0.5–1.9)

## 2018-01-01 LAB — TROPONIN I: TROPONIN I: 26.68 ng/mL — AB (ref <0.03)

## 2018-01-01 MED ORDER — TRAMADOL HCL 50 MG PO TABS
50.0000 mg | ORAL_TABLET | Freq: Two times a day (BID) | ORAL | Status: DC | PRN
Start: 2018-01-01 — End: 2018-01-04
  Administered 2018-01-02: 50 mg via ORAL
  Filled 2018-01-01: qty 1

## 2018-01-01 MED ORDER — HYDROCORTISONE ACETATE 25 MG RE SUPP
25.0000 mg | RECTAL | Status: DC | PRN
  Filled 2018-01-01: qty 1

## 2018-01-01 MED ORDER — LUBIPROSTONE 24 MCG PO CAPS
24.0000 ug | ORAL_CAPSULE | Freq: Two times a day (BID) | ORAL | Status: DC
  Administered 2018-01-02 – 2018-01-04 (×3): 24 ug via ORAL
  Filled 2018-01-01 (×8): qty 1

## 2018-01-01 MED ORDER — SODIUM CHLORIDE 0.9 % IV BOLUS (SEPSIS)
1000.0000 mL | Freq: Once | INTRAVENOUS | Status: AC
  Administered 2018-01-01: 1000 mL via INTRAVENOUS

## 2018-01-01 MED ORDER — IOPAMIDOL (ISOVUE-300) INJECTION 61%
75.0000 mL | Freq: Once | INTRAVENOUS | Status: AC | PRN
  Administered 2018-01-01: 75 mL via INTRAVENOUS

## 2018-01-01 MED ORDER — ACETAMINOPHEN 500 MG PO TABS: 1000.0000 mg | ORAL_TABLET | Freq: Three times a day (TID) | ORAL | Status: DC | PRN

## 2018-01-01 MED ORDER — DICYCLOMINE HCL 10 MG PO CAPS
10.0000 mg | ORAL_CAPSULE | Freq: Three times a day (TID) | ORAL | Status: DC
  Administered 2018-01-01 – 2018-01-04 (×11): 10 mg via ORAL
  Filled 2018-01-01 (×12): qty 1

## 2018-01-01 MED ORDER — SODIUM CHLORIDE 0.9 % IV BOLUS (SEPSIS): 1000.0000 mL | Freq: Once | INTRAVENOUS | Status: DC

## 2018-01-01 MED ORDER — OXYCODONE HCL 5 MG PO TABS: 5.0000 mg | ORAL_TABLET | Freq: Four times a day (QID) | ORAL | Status: DC | PRN

## 2018-01-01 MED ORDER — CARVEDILOL 6.25 MG PO TABS
6.2500 mg | ORAL_TABLET | Freq: Two times a day (BID) | ORAL | Status: DC
  Administered 2018-01-01 – 2018-01-04 (×5): 6.25 mg via ORAL
  Filled 2018-01-01 (×7): qty 1

## 2018-01-01 MED ORDER — FAMOTIDINE IN NACL 20-0.9 MG/50ML-% IV SOLN
20.0000 mg | Freq: Two times a day (BID) | INTRAVENOUS | Status: DC
  Administered 2018-01-01 – 2018-01-02 (×2): 20 mg via INTRAVENOUS
  Filled 2018-01-01 (×4): qty 50

## 2018-01-01 MED ORDER — TEMAZEPAM 15 MG PO CAPS
30.0000 mg | ORAL_CAPSULE | Freq: Every evening | ORAL | Status: DC | PRN
  Administered 2018-01-01: 30 mg via ORAL
  Filled 2018-01-01: qty 2

## 2018-01-01 MED ORDER — CEFEPIME HCL 2 G IJ SOLR
2.0000 g | Freq: Once | INTRAMUSCULAR | Status: DC
  Filled 2018-01-01: qty 2

## 2018-01-01 MED ORDER — ACETAMINOPHEN 500 MG PO TABS
1000.0000 mg | ORAL_TABLET | Freq: Three times a day (TID) | ORAL | Status: DC | PRN
  Administered 2018-01-04: 1000 mg via ORAL
  Filled 2018-01-01: qty 2

## 2018-01-01 MED ORDER — DIPHENHYDRAMINE HCL 25 MG PO CAPS: 25.0000 mg | ORAL_CAPSULE | Freq: Every evening | ORAL | Status: DC | PRN

## 2018-01-01 MED ORDER — SODIUM CHLORIDE 0.9 % IJ SOLN
INTRAMUSCULAR | Status: AC
  Filled 2018-01-01: qty 50

## 2018-01-01 MED ORDER — SODIUM CHLORIDE 0.9 % IV SOLN
INTRAVENOUS | Status: DC
Start: 2018-01-01 — End: 2018-01-04
  Administered 2018-01-01 – 2018-01-03 (×3): via INTRAVENOUS

## 2018-01-01 MED ORDER — ASPIRIN EC 81 MG PO TBEC
81.0000 mg | DELAYED_RELEASE_TABLET | Freq: Every day | ORAL | Status: DC
  Administered 2018-01-01 – 2018-01-04 (×4): 81 mg via ORAL
  Filled 2018-01-01 (×4): qty 1

## 2018-01-01 MED ORDER — OXYCODONE HCL 5 MG PO TABS
5.0000 mg | ORAL_TABLET | Freq: Four times a day (QID) | ORAL | Status: DC | PRN
  Administered 2018-01-02 – 2018-01-04 (×7): 5 mg via ORAL
  Filled 2018-01-01 (×8): qty 1

## 2018-01-01 MED ORDER — ASPIRIN 325 MG PO TABS: 325.0000 mg | ORAL_TABLET | Freq: Every day | ORAL | Status: DC

## 2018-01-01 MED ORDER — DEXTROSE 5 % IV SOLN
2.0000 g | INTRAVENOUS | Status: AC
  Administered 2018-01-02 – 2018-01-03 (×2): 2 g via INTRAVENOUS
  Filled 2018-01-01 (×2): qty 2

## 2018-01-01 MED ORDER — PROCHLORPERAZINE MALEATE 10 MG PO TABS
10.0000 mg | ORAL_TABLET | Freq: Four times a day (QID) | ORAL | Status: DC | PRN
Start: 2018-01-01 — End: 2018-01-04

## 2018-01-01 MED ORDER — ISOSORBIDE MONONITRATE ER 60 MG PO TB24
60.0000 mg | ORAL_TABLET | Freq: Every day | ORAL | Status: DC
  Administered 2018-01-02 – 2018-01-04 (×3): 60 mg via ORAL
  Filled 2018-01-01 (×3): qty 1

## 2018-01-01 MED ORDER — IOPAMIDOL (ISOVUE-300) INJECTION 61%
INTRAVENOUS | Status: AC
  Filled 2018-01-01: qty 75

## 2018-01-01 MED ORDER — PRAMIPEXOLE DIHYDROCHLORIDE 1 MG PO TABS
1.5000 mg | ORAL_TABLET | Freq: Two times a day (BID) | ORAL | Status: DC
  Administered 2018-01-01 – 2018-01-04 (×6): 1.5 mg via ORAL
  Filled 2018-01-01 (×7): qty 2

## 2018-01-01 MED ORDER — MORPHINE SULFATE (PF) 4 MG/ML IV SOLN
4.0000 mg | Freq: Once | INTRAVENOUS | Status: AC
  Administered 2018-01-01: 4 mg via INTRAVENOUS
  Filled 2018-01-01: qty 1

## 2018-01-01 MED ORDER — NAPHAZOLINE-PHENIRAMINE 0.025-0.3 % OP SOLN
1.0000 [drp] | Freq: Two times a day (BID) | OPHTHALMIC | Status: DC | PRN
  Filled 2018-01-01: qty 15

## 2018-01-01 MED ORDER — CEFEPIME HCL 2 G IJ SOLR
2.0000 g | Freq: Once | INTRAMUSCULAR | Status: AC
  Administered 2018-01-01: 2 g via INTRAVENOUS
  Filled 2018-01-01 (×2): qty 2

## 2018-01-01 MED ORDER — NITROGLYCERIN 0.4 MG SL SUBL: 0.4000 mg | SUBLINGUAL_TABLET | SUBLINGUAL | Status: DC | PRN

## 2018-01-01 NOTE — Progress Notes (Signed)
Pharmacy Antibiotic Note  Charles Coffey is a 83 y.o. male with invasive urothelial carcinoma on chemotherapy  admitted on 01/01/2018 with UTI.  Pharmacy has been consulted for cefepime dosing.  Plan: Cefepime 2g IV q24h Follow up renal function & cultures  Height: 5' 8.5" (174 cm) Weight: 188 lb (85.3 kg) IBW/kg (Calculated) : 69.55  Temp (24hrs), Avg:100 F (37.8 C), Min:100 F (37.8 C), Max:100 F (37.8 C)  Recent Labs  Lab 12/27/17 1227 12/29/17 1014 01/01/18 1303 01/01/18 1319  WBC 6.2 4.2 12.5*  --   CREATININE 1.78* 1.6* 1.66*  --   LATICACIDVEN  --   --   --  1.16    Estimated Creatinine Clearance: 38.1 mL/min (A) (by C-G formula based on SCr of 1.66 mg/dL (H)).    Allergies  Allergen Reactions  . Antihistamines, Loratadine-Type Other (See Comments)    Unable to urinate  . Statins Other (See Comments)    liver effects    Antimicrobials this admission: 1/3 Cipro >> 1/5 1/5 Cefepime >>  Dose adjustments this admission:   Microbiology results: 1/5 BCx: sent 1/5 UCx: sent   Thank you for allowing pharmacy to be a part of this patient's care.  Peggyann Juba, PharmD, BCPS Pager: [phone] 01/01/2018 4:56 PM

## 2018-01-01 NOTE — ED Triage Notes (Signed)
Pt from home. Per pt wife, pt has bladder cancer and received his first chemo 2 days ago. Pt has nephrostomy and colostomy and has been  "giving them issues". Pt wife reports pt is febrile, SOB and weak. Pt is A&O and in NAD

## 2018-01-01 NOTE — ED Provider Notes (Signed)
[facility] DEPT Provider Note   CSN: [id number] Arrival date & time: 01/01/18  1228     History   Chief Complaint Chief Complaint  Patient presents with  . Fever    HPI Charles Coffey is a 83 y.o. male.  Pt presents to the ED today with fever.  He has a recent diagnosis of invasive urothelial carcinoma.  He underwent cystoscopy and left retrograde pyelogram with TURBT on 11/25/17.  The pt presented to the ED on 12/2 (d/c on 12/8) with left flank pain and fever.  He was found to have bilateral hydronephrosis likely from malignant obstruction and pyelo.  He has a left PCN on 12/2 and was started on rocephin and d/c on keflex.  The pt also had a diverting colostomy which was performed on 12/5.  Right PCN tube done on 12/7.  Biopsy of colonic mucosa + for poorly differentiated carcinoma.  Retroperitoneal biopsy was also + for poorly differentiated carcinoma.  Pt also seen by Dr. [name] who recommended palliative chemo.  1st dose of carboplatin and gemcitabine given 1/3.  1 of the nephrostomy tubes is leaking and was collecting in pt's bladder.  Pt did f/u with Dr. [name] (urology) who placed a foley catheter.  Urine was still infected, so he was put on cipro.  Pt developed a fever of 102 at home last night.  Pt's wife called Dr. Annamaria Boots (oncology) this morning who told pt to come to the ED.   Pt does c/o pain to his abdomen.              Past Medical History:  Diagnosis Date  . Aortic atherosclerosis ([place])   . BPH with urinary obstruction   . CAD (coronary artery disease)    a.  MI 1995, CABG x 3 2002 (patient says that he had LIMA and RIMA grafts). b. ETT-Cardiolite (10/15) with EF 48%, apical scar, no ischemia. c. Nuc 07/2017 abnormal -> cath was performed,  patent LIMA to LAD and SVG to OM 2. RIMA to RCA is atretic. The right coronary artery is occluded distally with extensive collaterals from the LAD, medical therapy.   . Cancer [facility])    bladder  cancer  . Cervical spondylosis without myelopathy 01/23/2016  . Chronic low back pain    sees Dr. Kary Kos   . GERD (gastroesophageal reflux disease)   . Hiatal hernia   . Hyperlipidemia   . Hypertension   . IBS (irritable bowel syndrome)   . Ischemic cardiomyopathy   . Myocardial infarction ([place])   . RBBB   . Restless legs syndrome   . Statin intolerance   . Syncope    a. in 2015 - no apparent cause, was taking sleep medicine at the time. Cardiac workup unremarkable.  . Tubular adenoma of colon     Patient Active Problem List   Diagnosis Date Noted  . Colostomy in place [facility]) 12/04/2017  . Rectal stenosis   . Low vitamin B12 level 11/30/2017  . Irritable bowel syndrome with constipation   . Pyelonephritis 11/28/2017  . Fever 11/28/2017  . Ileus ([place]) 11/28/2017  . Elevated troponin 11/28/2017  . Urothelial carcinoma of bladder ([place]) 11/28/2017  . Dehydration 11/28/2017  . Thrombocytopenia ([place]) 11/28/2017  . Hydronephrosis due to obstructive malignant neoplasm of bladder ([place]) 11/25/2017  . Anemia 09/20/2017  . Stage 3 chronic kidney disease ([place]) 09/20/2017  . Angina pectoris ([place])   . Abnormal stress test   . Cervical spondylosis without  myelopathy Apr 14, 202017  . PAD (peripheral artery disease) ([place]) 03/04/2015  . Arteriosclerosis of coronary artery 11/01/2014  . Acid reflux 11/01/2014  . H/O male genital system disorder 11/01/2014  . Restless leg 11/01/2014  . Acne erythematosa 11/01/2014  . Syncope 09/24/2014  . Chronic low back pain 11/10/2013  . Cardiomyopathy, ischemic 11/29/2012  . Hyperlipidemia LDL goal <70 10/15/2010  . RESTLESS LEGS SYNDROME 10/15/2010  . Essential hypertension 10/15/2010  . MYOCARDIAL INFARCTION, HX OF 10/15/2010  . Coronary artery disease of native heart with stable angina pectoris ([place]) 10/15/2010  . GERD 10/15/2010  . BENIGN PROSTATIC HYPERTROPHY 10/15/2010  . BENIGN PROSTATIC HYPERTROPHY, WITH OBSTRUCTION 10/15/2010  . COLONIC  POLYPS, HX OF 10/15/2010    Past Surgical History:  Procedure Laterality Date  . COLONOSCOPY  10/17/2014   per Dr. Hilarie Fredrickson, tubular adenomas, repeat in 3 yrs   . COLONOSCOPY WITH PROPOFOL N/A 12/01/2017   Procedure: COLONOSCOPY WITH PROPOFOL;  Surgeon: Milus Banister, MD;  Location: [facility];  Service: Endoscopy;  Laterality: N/A;  . CYSTOSCOPY W/ RETROGRADES Left 11/25/2017   Procedure: CYSTOSCOPY WITH RETROGRADE PYELOGRAM;  Surgeon: Irine Seal, MD;  Location: [facility] ORS;  Service: Urology;  Laterality: Left;  . HEART BYPASS    . IR FLUORO GUIDE CV LINE RIGHT  12/27/2017  . IR NEPHROSTOGRAM LEFT THRU EXISTING ACCESS  12/24/2017  . IR NEPHROSTOMY EXCHANGE RIGHT  12/24/2017  . IR NEPHROSTOMY PLACEMENT LEFT  11/28/2017  . IR NEPHROSTOMY PLACEMENT RIGHT  12/03/2017  . IR US GUIDE VASC ACCESS RIGHT  12/27/2017  . LAPAROSCOPY N/A 12/01/2017   Procedure: LAPAROSCOPIC DIVERTING OSTOMY;  Surgeon: Stark Klein, MD;  Location: [facility];  Service: General;  Laterality: N/A;  . LEFT HEART CATH AND CORS/GRAFTS ANGIOGRAPHY N/A 08/25/2017   Procedure: LEFT HEART CATH AND CORS/GRAFTS ANGIOGRAPHY;  Surgeon: Wellington Hampshire, MD;  Location: [facility];  Service: Cardiovascular;  Laterality: N/A;  . LUMBAR FUSION  2003   L3-L4  . PROSTATE SURGERY  06-27-12   per Dr. Roni Bread, had CTT  . TONSILLECTOMY    . TRANSURETHRAL RESECTION OF BLADDER TUMOR N/A 11/25/2017   Procedure: TRANSURETHRAL RESECTION OF BLADDER TUMOR (TURBT);  Surgeon: Irine Seal, MD;  Location: [facility];  Service: Urology;  Laterality: N/A;       Home Medications    Prior to Admission medications   Medication Sig Start Date End Date Taking? Authorizing Provider  acetaminophen (TYLENOL) 500 MG tablet Take 1,000 mg by mouth every 8 (eight) hours as needed for mild pain or moderate pain.   Yes [provider]  carvedilol (COREG) 6.25 MG tablet Take 1 tablet (6.25 mg total) 2 (two) times daily with a meal by mouth. 11/09/17  Yes  Larey Dresser, MD  ciprofloxacin (CIPRO) 250 MG tablet Take 250 mg by mouth 2 (two) times daily. 12/30/17  Yes [provider]  dicyclomine (BENTYL) 10 MG capsule Take 1-2 by mouth every 6 hours as needed for rectal spasms. 12/29/17  Yes Pyrtle, Lajuan Lines, MD  diphenhydramine-acetaminophen (TYLENOL PM EXTRA STRENGTH) 25-500 MG TABS tablet Take 1 tablet by mouth at bedtime as needed.    Yes [provider]  Hydrocortisone Acetate (HEMORRHOIDAL-HC RE) Place 1 application rectally as needed (hemorrhoids).   Yes [provider]  isosorbide mononitrate (IMDUR) 60 MG 24 hr tablet Take 1 tablet (60 mg total) by mouth daily. 10/28/17 01/26/18 Yes End, Harrell Gave, MD  linaclotide (LINZESS) 290 MCG CAPS capsule TAKE 1 CAPSULE (290 MCG TOTAL)  BY MOUTH DAILY. [place] A MEAL 03/30/17  Yes Pyrtle, Lajuan Lines, MD  lubiprostone (AMITIZA) 24 MCG capsule Take 1 capsule (24 mcg total) by mouth 2 (two) times daily with a meal. 11/23/17  Yes Pyrtle, Lajuan Lines, MD  nitroGLYCERIN (NITROSTAT) 0.4 MG SL tablet Place 1 tablet (0.4 mg total) under the tongue every 5 (five) minutes as needed. Patient taking differently: Place 0.4 mg under the tongue every 5 (five) minutes as needed for chest pain.  08/19/17 01/01/18 Yes Dunn, Dayna N, PA-C  oxyCODONE (ROXICODONE) 5 MG immediate release tablet Take 1 tablet (5 mg total) by mouth every 6 (six) hours as needed. Patient taking differently: Take 5 mg by mouth every 6 (six) hours as needed for moderate pain.  12/09/17 12/09/18 Yes [name], MD  pramipexole (MIRAPEX) 1 MG tablet TAKE 1 AND 1/2 TABLETS BY MOUTH AT BEDTIME Patient taking differently: TAKE 1 AND 1/2 TABLETS BY MOUTH TWICE DAILY 06/29/17  Yes [name], MD  prochlorperazine (COMPAZINE) 10 MG tablet Take 1 tablet (10 mg total) by mouth every 6 (six) hours as needed for nausea or vomiting. 12/15/17  Yes Wyatt Portela, MD  ranitidine (ZANTAC) 300 MG tablet TAKE 1 TABLET BY MOUTH TWICE A DAY  10/04/17  Yes Pyrtle, Lajuan Lines, MD  temazepam (RESTORIL) 30 MG capsule Take 1 capsule (30 mg total) by mouth at bedtime as needed for sleep. 12/24/17  Yes [name], MD  Tetrahydroz-Glyc-Hyprom-PEG [place] MAXIMUM REDNESS RELIEF OP) Place 2 drops into both eyes 2 (two) times daily as needed (dry eyes).    Yes [provider]  traMADol (ULTRAM) 50 MG tablet TAKE 1 TABLET BY MOUTH EVERY 6 HOURS AS NEEDED Patient taking differently: TAKE 1 TABLET BY MOUTH EVERY 6 HOURS AS NEEDED FOR PAIN 12/31/17  Yes [name], MD  lidocaine-prilocaine (EMLA) cream Apply 1 application topically as needed. 12/15/17   Wyatt Portela, MD    Family History Family History  Problem Relation Age of Onset  . Heart disease Mother   . Heart attack Mother   . Aneurysm Father        femoral artery  . Heart disease Brother   . Heart disease Maternal Uncle        x 2  . Colon cancer Neg Hx   . Esophageal cancer Neg Hx   . Pancreatic cancer Neg Hx   . Kidney disease Neg Hx   . Liver disease Neg Hx     Social History Social History   Tobacco Use  . Smoking status: Former Smoker    Types: Cigarettes    Last attempt to quit: 12/28/1978    Years since quitting: 39.0  . Smokeless tobacco: Never Used  Substance Use Topics  . Alcohol use: Yes    Alcohol/week: 0.0 oz    Comment: occ-  . Drug use: No     Allergies   Antihistamines, loratadine-type and Statins   Review of Systems Review of Systems  Constitutional: Positive for fatigue and fever.  Gastrointestinal: Positive for abdominal pain.  All other systems reviewed and are negative.    Physical Exam Updated Vital Signs BP 113/72   Pulse 80   Temp 100 F (37.8 C) (Oral)   Resp 18   Ht 5' 8.5" (1.74 m)   Wt 85.3 kg (188 lb)   SpO2 93%   BMI 28.17 kg/m   Physical Exam  Constitutional: He is oriented to person, place, and  time. He appears well-developed and well-nourished.  HENT:  Head: Normocephalic and atraumatic.  Right  Ear: External ear normal.  Left Ear: External ear normal.  Nose: Nose normal.  Mouth/Throat: Mucous membranes are dry.  Eyes: Conjunctivae and EOM are normal. Pupils are equal, round, and reactive to light.  Neck: Normal range of motion. Neck supple.  Cardiovascular: Normal rate, regular rhythm, normal heart sounds and intact distal pulses.  Pulmonary/Chest: Effort normal and breath sounds normal.  Abdominal: Soft. Bowel sounds are normal. There is generalized tenderness.  Right and left percutaneous nephrostomy tubes noted.  Musculoskeletal: Normal range of motion.  Neurological: He is alert and oriented to person, place, and time.  Skin: Skin is warm. Capillary refill takes less than 2 seconds.  Psychiatric: He has a normal mood and affect. His behavior is normal. Judgment and thought content normal.  Nursing note and vitals reviewed.    ED Treatments / Results  Labs (all labs ordered are listed, but only abnormal results are displayed) Labs Reviewed  COMPREHENSIVE METABOLIC PANEL - Abnormal; Notable for the following components:      Result Value   Sodium 129 (*)    CO2 18 (*)    BUN 50 (*)    Creatinine, Ser 1.66 (*)    Calcium 8.8 (*)    Total Protein 6.2 (*)    AST 90 (*)    Total Bilirubin 1.3 (*)    GFR calc non Af Amer 37 (*)    GFR calc Af Amer 43 (*)    All other components within normal limits  CBC WITH DIFFERENTIAL/PLATELET - Abnormal; Notable for the following components:   WBC 12.5 (*)    RBC 2.44 (*)    Hemoglobin 7.5 (*)    HCT 21.9 (*)    RDW 15.9 (*)    Platelets 56 (*)    Neutro Abs 11.1 (*)    All other components within normal limits  URINALYSIS, ROUTINE W REFLEX MICROSCOPIC - Abnormal; Notable for the following components:   Hgb urine dipstick LARGE (*)    Protein, ur 100 (*)    Leukocytes, UA MODERATE (*)    Bacteria, UA RARE (*)    All other components within normal limits  TROPONIN I - Abnormal; Notable for the following components:    Troponin I 26.68 (*)    All other components within normal limits  URINE CULTURE  CULTURE, BLOOD (ROUTINE X 2)  CULTURE, BLOOD (ROUTINE X 2)  URINE CULTURE  URINE CULTURE  I-STAT CG4 LACTIC ACID, ED  I-STAT CG4 LACTIC ACID, ED    EKG  EKG Interpretation  Date/Time:  Saturday January 01 2018 12:49:58 EST Ventricular Rate:  74 PR Interval:    QRS Duration: 147 QT Interval:  412 QTC Calculation: 458 R Axis:   -84 Text Interpretation:  Sinus rhythm Prolonged PR interval Right bundle branch block Consider inferior infarct Probable anterolateral infarct, old No significant change since last tracing Confirmed by Isla Pence [id number]) on 01/01/2018 12:55:59 PM       Radiology Dg Chest 2 View  Result Date: 01/01/2018 CLINICAL DATA:  Fever.  History of bladder cancer. EXAM: CHEST  2 VIEW COMPARISON:  11/29/2017; 11/28/2017 ; CT abdomen pelvis - earlier same day FINDINGS: Grossly unchanged enlarged cardiac silhouette and mediastinal contours given reduced lung volumes. Atherosclerotic plaque within thoracic aorta. Post median sternotomy. Stable position of support apparatus. Bilateral infrahilar opacities are unchanged and favored to represent atelectasis. Unchanged diffuse slightly nodular thickening of the  pulmonary interstitium. No focal airspace opacities. No pleural effusion or pneumothorax. No evidence of edema. No acute osseus abnormalities. IMPRESSION: Chronic bronchitic change without superimposed acute cardiopulmonary disease. Specifically, no evidence of pneumonia. Electronically Signed   By: Sandi Mariscal M.D.   On: 01/01/2018 14:55   Ct Abdomen Pelvis W Contrast  Result Date: 01/01/2018 CLINICAL DATA:  Bladder cancer, received first dose of chemotherapy 2 days ago, abdominal pain, fever, shortness of breath and weakness. History of nephrostomy, colostomy, coronary artery disease post MI, hypertension, ischemic cardiomyopathy, former smoker EXAM: CT ABDOMEN AND PELVIS WITH CONTRAST  TECHNIQUE: Multidetector CT imaging of the abdomen and pelvis was performed using the standard protocol following bolus administration of intravenous contrast. Sagittal and coronal MPR images reconstructed from axial data set. CONTRAST:  1mL ISOVUE-300 IOPAMIDOL (ISOVUE-300) INJECTION 61% IV. No oral contrast. COMPARISON:  12/18/2017 FINDINGS: Lower chest: Peribronchial thickening and interseptal thickening in the lower lobes greater on RIGHT. Tiny BILATERAL pleural effusions greater on RIGHT. Minimal infiltrate in RIGHT lower lobe. Questionable infiltrate versus small nodular focus 8 mm diameter image 2 in RIGHT lower lobe. Hepatobiliary: Mild gallbladder wall thickening with minimal adjacent fluid. Minimal periportal edema. Liver unremarkable. Pancreas: Normal appearance Spleen: Normal appearance Adrenals/Urinary Tract: Adrenal glands normal appearance. BILATERAL nephrostomy tubes without hydronephrosis. Cortical atrophy of LEFT kidney. No definite renal mass. Foley catheter decompresses urinary bladder. Marked bladder wall thickening evident. Stomach/Bowel: Transverse double barrel loop colostomy. Rectal wall thickening, could be related to proctitis either from infection or radiation. Stranding of perirectal fat and presacral fat planes. Stomach and remaining bowel loops unremarkable. Vascular/Lymphatic: Atherosclerotic calcifications aorta and iliac arteries. Coronary arterial calcifications. No definite adenopathy. Reproductive: Enlargement of prostate gland measuring 5.3 x 4.7 x 6.3 cm. Other: Small BILATERAL inguinal hernias LEFT larger than RIGHT containing fat. No free air or free fluid. Musculoskeletal: Prior L3-L4 posterior fusion. Degenerative disc disease changes at L2-L3 and L5-S1. Scattered Schmorl's nodes without definite metastatic bone lesions. IMPRESSION: Diffuse bladder wall thickening which suspect related to known tumor. Diffuse wall thickening of the rectum with perirectal and presacral  infiltrative changes, question infectious proctitis versus prior radiation therapy. Prostatic enlargement. BILATERAL nephrostomy tubes without hydronephrosis. Cortical atrophy LEFT kidney. Mild gallbladder wall thickening question surrounding fluid ; recommend follow-up sonographic assessment. Minimal bibasilar effusions atelectasis as well as minimal infiltrate in RIGHT lower lobe. Question 8 mm diameter RIGHT lower lobe nodule, recommend attention on follow-up imaging. Small BILATERAL inguinal hernias larger on LEFT. Aortic Atherosclerosis (ICD10-I70.0). Electronically Signed   By: Lavonia Dana M.D.   On: 01/01/2018 14:56    Procedures Procedures (including critical care time)  Medications Ordered in ED Medications  ceFEPIme (MAXIPIME) 2 g in dextrose 5 % 50 mL IVPB (2 g Intravenous Not Given 01/01/18 1401)  iopamidol (ISOVUE-300) 61 % injection (not administered)  sodium chloride 0.9 % injection (not administered)  aspirin tablet 325 mg (not administered)  morphine 4 MG/ML injection 4 mg (4 mg Intravenous Given 01/01/18 1339)  sodium chloride 0.9 % bolus 1,000 mL (0 mLs Intravenous Stopped 01/01/18 1451)    And  sodium chloride 0.9 % bolus 1,000 mL (1,000 mLs Intravenous New Bag/Given 01/01/18 1452)    And  sodium chloride 0.9 % bolus 1,000 mL (1,000 mLs Intravenous New Bag/Given 01/01/18 1452)  ceFEPIme (MAXIPIME) 2 g in dextrose 5 % 50 mL IVPB (0 g Intravenous Stopped 01/01/18 1451)  iopamidol (ISOVUE-300) 61 % injection 75 mL (75 mLs Intravenous Contrast Given 01/01/18 1425)     Initial  Impression / Assessment and Plan / ED Course  I have reviewed the triage vital signs and the nursing notes.  Pertinent labs & imaging results that were available during my care of the patient were reviewed by me and considered in my medical decision making (see chart for details).    Results of troponin reviewed with cardiologist.  Last cath by Dr. Fletcher Anon on 08/25/17 showed:     LM lesion, 40  %stenosed.  Prox Cx to Mid Cx lesion, 100 %stenosed.  Mid LAD lesion, 100 %stenosed.  Ost LAD to Prox LAD lesion, 40 %stenosed.  Prox RCA to Mid RCA lesion, 30 %stenosed.  Dist RCA lesion, 100 %stenosed.  SVG.  The graft exhibits minimal luminal irregularities.  LIMA.  Prox Graft lesion, 100 %stenosed.  LIMA and is normal in caliber and anatomically normal.   1. Significant underlying three-vessel coronary artery disease with patent LIMA to LAD and SVG to OM 2. RIMA to RCA is atretic. The right coronary artery is occluded distally with extensive collaterals from the LAD.  2. Mildly elevated left ventricular end-diastolic pressure at 15 mmHg.  Recommendations: Continue medical therapy. 55 mL of contrast was used for the procedure. I'm going to observe the patient overnight with gentle hydration given underlying chronic kidney disease. Likely discharge home tomorrow.    Dr. Haroldine Laws feels like this is demand ischemia from underlying 3 vessel disease.  He recommends ASA (he is aware plt are low, but thinks it will be beneficial as long as he's not actively bleeding).  He would not heparinize or repeat cath.  Treat the sepsis.  CRITICAL CARE Performed by: Isla Pence   Total critical care time: 30 minutes  Critical care time was exclusive of separately billable procedures and treating other patients.  Critical care was necessary to treat or prevent imminent or life-threatening deterioration.  Critical care was time spent personally by me on the following activities: development of treatment plan with patient and/or surrogate as well as nursing, discussions with consultants, evaluation of patient's response to treatment, examination of patient, obtaining history from patient or surrogate, ordering and performing treatments and interventions, ordering and review of laboratory studies, ordering and review of radiographic studies, pulse oximetry and re-evaluation of patient's  condition.  Pt given sepsis fluids and bp better.  Pt given maxipime for catheter associated UTI.  Pt d/w (triad) for admission.   Final Clinical Impressions(s) / ED Diagnoses   Final diagnoses:  Sepsis, due to unspecified organism ([place])  Elevated troponin  Urothelial cancer ([place])  Anemia, unspecified type  Thrombocytopenia ([place])  Pyelonephritis  Hyponatremia    ED Discharge Orders    None       Isla Pence, MD 01/01/18 1621

## 2018-01-01 NOTE — H&P (Addendum)
Patient Demographics:    Charles Coffey, is a 83 y.o. male  MRN: [id number]   DOB - [birth year]  Admit Date - 01/01/2018  Outpatient Primary MD for the patient is [name], MD   Assessment & Plan:    Principal Problem:   Sepsis secondary to UTI [facility]) Active Problems:   Essential hypertension   Anemia   Elevated troponin   Urothelial carcinoma of bladder (HCC)   Thrombocytopenia (HCC)   Acute lower UTI  1) sepsis secondary to urinary source-patient with complicated UTI history of prior invasive urothelial carcinoma with hydronephrosis, Foley in situ, bilateral nephrostomy tubes in situ- admitted 01/01/18 with abd  And flank pain, fever and leukocytosis noted, lactic acid 1.1, IV cefepime pending urine and blood cultures, aggressive IV hydration  2)Elevated Troponin- No chest pain at this time, EKG without acute ST elevation, ED provider discussed this with on-call cardiologist Dr. Pierre Bali recommended baby aspirin and conservative treatment without for anticoagulation given thrombocytopenia, patient apparently is not a candidate for intervention of advised against transfer to [facility], please note that the pt had LHC on 08/25/17 with Significant underlying three-vessel coronary artery disease with patent LIMA to LAD and SVG to OM 2. RIMA to RCA is atretic. The right coronary artery is occluded distally with extensive collaterals from the LAD, last known EF over 50%, repeat echo to evaluate wall motion abnormalities and EF pending. C/n Coreg, give aspirin 81 mg, pt is intolerant to statin  3)Obstructive Uropathy- With pmhx relevant for invasive urothelial carcinoma who is s/p cystoscopy and left retrograde pyelogram with TURBT on 11/25/17, subsequently found to have  bilateral hydronephrosis likely from malignant  obstruction and pyelo and underwent left PCN on 11/28/17 , pt is also s/p Diverting Colostomy which was performed on 12/01/17, subsequently had   Right PCN tube on 12/03/17.  Pt  Received palliative chemo- as per  Dr. [name] 1st dose of carboplatin and gemcitabine given 12/30/17.  1 of the nephrostomy tubes is leaking and was collecting in pt's bladder, Dr. [name] (urology) placed a foley catheter. Will recall Urology    4)CKD III-renal function appears to be at baseline with creatinine around 1.6, avoid nephrotoxic agents maintain adequate hydration  With History of - Reviewed by me  Past Medical History:  Diagnosis Date  . Aortic atherosclerosis ([place])   . BPH with urinary obstruction   . CAD (coronary artery disease)    a.  MI 1995, CABG x 3 2002 (patient says that he had LIMA and RIMA grafts). b. ETT-Cardiolite (10/15) with EF 48%, apical scar, no ischemia. c. Nuc 07/2017 abnormal -> cath was performed,  patent LIMA to LAD and SVG to OM 2. RIMA to RCA is atretic. The right coronary artery is occluded distally with extensive collaterals from the LAD, medical therapy.   . Cancer [facility])    bladder cancer  . Cervical spondylosis without myelopathy 07-14-202017  . Chronic low back  pain    sees Dr. Kary Kos   . GERD (gastroesophageal reflux disease)   . Hiatal hernia   . Hyperlipidemia   . Hypertension   . IBS (irritable bowel syndrome)   . Ischemic cardiomyopathy   . Myocardial infarction ([place])   . RBBB   . Restless legs syndrome   . Statin intolerance   . Syncope    a. in 2015 - no apparent cause, was taking sleep medicine at the time. Cardiac workup unremarkable.  . Tubular adenoma of colon       Past Surgical History:  Procedure Laterality Date  . COLONOSCOPY  10/17/2014   per Dr. Hilarie Fredrickson, tubular adenomas, repeat in 3 yrs   . COLONOSCOPY WITH PROPOFOL N/A 12/01/2017   Procedure: COLONOSCOPY WITH PROPOFOL;  Surgeon: Milus Banister, MD;  Location: [facility];  Service: Endoscopy;   Laterality: N/A;  . CYSTOSCOPY W/ RETROGRADES Left 11/25/2017   Procedure: CYSTOSCOPY WITH RETROGRADE PYELOGRAM;  Surgeon: Irine Seal, MD;  Location: [facility];  Service: Urology;  Laterality: Left;  . HEART BYPASS    . IR FLUORO GUIDE CV LINE RIGHT  12/27/2017  . IR NEPHROSTOGRAM LEFT THRU EXISTING ACCESS  12/24/2017  . IR NEPHROSTOMY EXCHANGE RIGHT  12/24/2017  . IR NEPHROSTOMY PLACEMENT LEFT  11/28/2017  . IR NEPHROSTOMY PLACEMENT RIGHT  12/03/2017  . IR US GUIDE VASC ACCESS RIGHT  12/27/2017  . LAPAROSCOPY N/A 12/01/2017   Procedure: LAPAROSCOPIC DIVERTING OSTOMY;  Surgeon: Stark Klein, MD;  Location: [facility];  Service: General;  Laterality: N/A;  . LEFT HEART CATH AND CORS/GRAFTS ANGIOGRAPHY N/A 08/25/2017   Procedure: LEFT HEART CATH AND CORS/GRAFTS ANGIOGRAPHY;  Surgeon: Wellington Hampshire, MD;  Location: [facility];  Service: Cardiovascular;  Laterality: N/A;  . LUMBAR FUSION  2003   L3-L4  . PROSTATE SURGERY  06-27-12   per Dr. Roni Bread, had CTT  . TONSILLECTOMY    . TRANSURETHRAL RESECTION OF BLADDER TUMOR N/A 11/25/2017   Procedure: TRANSURETHRAL RESECTION OF BLADDER TUMOR (TURBT);  Surgeon: Irine Seal, MD;  Location: [facility];  Service: Urology;  Laterality: N/A;      Chief Complaint  Patient presents with  . Fever      HPI:    Charles Coffey  is a 83 y.o. male  With pmhx relevant for invasive urothelial carcinoma who is s/p cystoscopy and left retrograde pyelogram with TURBT on 11/25/17, subsequently found to have  bilateral hydronephrosis likely from malignant obstruction and pyelo and underwent left PCN on 11/28/17 , pt is also s/p Diverting Colostomy which was performed on 12/01/17, subsequently had   Right PCN tube on 12/03/17.  Pt  Received palliative chemo- as per  Dr. [name] 1st dose of carboplatin and gemcitabine given 12/30/17.  1 of the nephrostomy tubes is leaking and was collecting in pt's bladder, Dr. [name] (urology)placed a foley catheter.  Urine was  infected, so he  was put on cipro.  Pt developed a fever of 102 at home on 12/31/17, Dr Burr Medico (Oncology) advised ED eval.  He complains of flank and abdominal pain, no vomiting,  In ED... Is found to have urine that appears infected, WBC 12.5 , temp was 102 at home, he was started on IV cefepime  In ED also found to have elevated troponin over 26, EKG without acute ST elevation, ED provider discussed this with on-call cardiologist Dr. Pierre Bali recommended baby aspirin and conservative treatment without for anticoagulation given thrombocytopenia, patient apparently is not a candidate  for intervention of advised against transfer to [facility], please note that the pt had LHC on 08/25/17 with Significant underlying three-vessel coronary artery disease with patent LIMA to LAD and SVG to OM 2. RIMA to RCA is atretic. The right coronary artery is occluded distally with extensive collaterals from the LAD   Review of systems:    In addition to the HPI above,   A full 12 point Review of 10 Systems was done, except as stated above, all other Review of 10 Systems were negative.    Social History:  Reviewed by me    Social History   Tobacco Use  . Smoking status: Former Smoker    Types: Cigarettes    Last attempt to quit: 12/28/1978    Years since quitting: 39.0  . Smokeless tobacco: Never Used  Substance Use Topics  . Alcohol use: Yes    Alcohol/week: 0.0 oz    Comment: occ-       Family History :  Reviewed by me    Family History  Problem Relation Age of Onset  . Heart disease Mother   . Heart attack Mother   . Aneurysm Father        femoral artery  . Heart disease Brother   . Heart disease Maternal Uncle        x 2  . Colon cancer Neg Hx   . Esophageal cancer Neg Hx   . Pancreatic cancer Neg Hx   . Kidney disease Neg Hx   . Liver disease Neg Hx     Home Medications:   Prior to Admission medications   Medication Sig Start Date End Date Taking? Authorizing Provider  acetaminophen  (TYLENOL) 500 MG tablet Take 1,000 mg by mouth every 8 (eight) hours as needed for mild pain or moderate pain.   Yes [provider]  carvedilol (COREG) 6.25 MG tablet Take 1 tablet (6.25 mg total) 2 (two) times daily with a meal by mouth. 11/09/17  Yes Larey Dresser, MD  ciprofloxacin (CIPRO) 250 MG tablet Take 250 mg by mouth 2 (two) times daily. 12/30/17  Yes [provider]  dicyclomine (BENTYL) 10 MG capsule Take 1-2 by mouth every 6 hours as needed for rectal spasms. 12/29/17  Yes Pyrtle, Lajuan Lines, MD  diphenhydramine-acetaminophen (TYLENOL PM EXTRA STRENGTH) 25-500 MG TABS tablet Take 1 tablet by mouth at bedtime as needed.    Yes [provider]  Hydrocortisone Acetate (HEMORRHOIDAL-HC RE) Place 1 application rectally as needed (hemorrhoids).   Yes [provider]  isosorbide mononitrate (IMDUR) 60 MG 24 hr tablet Take 1 tablet (60 mg total) by mouth daily. 10/28/17 01/26/18 Yes End, Harrell Gave, MD  linaclotide (LINZESS) 290 MCG CAPS capsule TAKE 1 CAPSULE (290 MCG TOTAL) BY MOUTH DAILY. [place] A MEAL 03/30/17  Yes Pyrtle, Lajuan Lines, MD  lubiprostone (AMITIZA) 24 MCG capsule Take 1 capsule (24 mcg total) by mouth 2 (two) times daily with a meal. 11/23/17  Yes Pyrtle, Lajuan Lines, MD  nitroGLYCERIN (NITROSTAT) 0.4 MG SL tablet Place 1 tablet (0.4 mg total) under the tongue every 5 (five) minutes as needed. Patient taking differently: Place 0.4 mg under the tongue every 5 (five) minutes as needed for chest pain.  08/19/17 01/01/18 Yes Dunn, Dayna N, PA-C  oxyCODONE (ROXICODONE) 5 MG immediate release tablet Take 1 tablet (5 mg total) by mouth every 6 (six) hours as needed. Patient taking differently: Take 5 mg by mouth every 6 (six) hours as  needed for moderate pain.  12/09/17 12/09/18 Yes [name], MD  pramipexole (MIRAPEX) 1 MG tablet TAKE 1 AND 1/2 TABLETS BY MOUTH AT BEDTIME Patient taking differently: TAKE 1 AND 1/2 TABLETS BY MOUTH TWICE DAILY 06/29/17  Yes  [name], MD  prochlorperazine (COMPAZINE) 10 MG tablet Take 1 tablet (10 mg total) by mouth every 6 (six) hours as needed for nausea or vomiting. 12/15/17  Yes Wyatt Portela, MD  ranitidine (ZANTAC) 300 MG tablet TAKE 1 TABLET BY MOUTH TWICE A DAY 10/04/17  Yes Pyrtle, Lajuan Lines, MD  temazepam (RESTORIL) 30 MG capsule Take 1 capsule (30 mg total) by mouth at bedtime as needed for sleep. 12/24/17  Yes [name], MD  Tetrahydroz-Glyc-Hyprom-PEG Karma [place] MAXIMUM REDNESS RELIEF OP) Place 2 drops into both eyes 2 (two) times daily as needed (dry eyes).    Yes [provider]  traMADol (ULTRAM) 50 MG tablet TAKE 1 TABLET BY MOUTH EVERY 6 HOURS AS NEEDED Patient taking differently: TAKE 1 TABLET BY MOUTH EVERY 6 HOURS AS NEEDED FOR PAIN 12/31/17  Yes [name], MD  lidocaine-prilocaine (EMLA) cream Apply 1 application topically as needed. 12/15/17   Wyatt Portela, MD     Allergies:     Allergies  Allergen Reactions  . Antihistamines, Loratadine-Type Other (See Comments)    Unable to urinate  . Statins Other (See Comments)    liver effects     Physical Exam:   Vitals  Blood pressure 108/73, pulse 76, temperature 100 F (37.8 C), temperature source Oral, resp. rate 20, height 5' 8.5" (1.74 m), weight 85.3 kg (188 lb), SpO2 94 %.  Physical Examination: General appearance - alert, ill appearing, and in no distress  Mental status - alert, oriented to person, place, and time,  Eyes - sclera anicteric Neck - supple, no JVD elevation , Chest -diminished in bases, fair air movement Heart - S1 and S2 normal, CABG scar Abdomen - soft, nontender, nondistended, colostomy bag noted, Bilateral nephrostomy tubes, right nephrostomy tube with urine , left nephrostomy tube bag is empty Neurological - screening mental status exam normal, neck supple without rigidity, cranial nerves II through XII intact, DTR's normal an d symmetric Extremities - no pedal edema noted, intact peripheral  pulses Skin - warm, dry GU-Foley with dark urine Psych-affect is appropriate    Data Review:    CBC Recent Labs  Lab 12/27/17 1227 12/29/17 1014 01/01/18 1303  WBC 6.2 4.2 12.5*  HGB 8.6* 8.8* 7.5*  HCT 24.9* 26.0* 21.9*  PLT 73* 70* 56*  MCV 89.2 89.3 89.8  MCH 30.8 30.2 30.7  MCHC 34.5 33.8 34.2  RDW 15.6* 15.5* 15.9*  LYMPHSABS  --  1.4 1.3  MONOABS  --  0.6 0.1  EOSABS  --  0.2 0.0  BASOSABS  --  0.0 0.0   ------------------------------------------------------------------------------------------------------------------  Chemistries  Recent Labs  Lab 12/27/17 1227 12/29/17 1014 01/01/18 1303  NA 132* 133* 129*  K 4.7 4.4 4.5  CL 106  --  104  CO2 19* 20* 18*  GLUCOSE 108* 102 97  BUN 37* 31.5* 50*  CREATININE 1.78* 1.6* 1.66*  CALCIUM 9.4 9.6 8.8*  AST  --  15 90*  ALT  --  13 30  ALKPHOS  --  61 47  BILITOT  --  0.43 1.3*   ------------------------------------------------------------------------------------------------------------------ estimated creatinine clearance is 38.1 mL/min (A) (by C-G formula based on SCr of 1.66 mg/dL (H)). ------------------------------------------------------------------------------------------------------------------ No results for input(s):  TSH, T4TOTAL, T3FREE, THYROIDAB in the last 72 hours.  Invalid input(s): FREET3   Coagulation profile Recent Labs  Lab 12/27/17 1227  INR 1.13   ------------------------------------------------------------------------------------------------------------------- No results for input(s): DDIMER in the last 72 hours. -------------------------------------------------------------------------------------------------------------------  Cardiac Enzymes Recent Labs  Lab 01/01/18 1303  TROPONINI 26.68*   ------------------------------------------------------------------------------------------------------------------ No results found for:  BNP   ---------------------------------------------------------------------------------------------------------------  Urinalysis    Component Value Date/Time   COLORURINE YELLOW 01/01/2018 [place] 01/01/2018 1303   LABSPEC 1.020 01/01/2018 1303   PHURINE 5.0 01/01/2018 1303   GLUCOSEU NEGATIVE 01/01/2018 1303   HGBUR LARGE (A) 01/01/2018 1303   HGBUR negative 10/15/2010 1251   BILIRUBINUR NEGATIVE 01/01/2018 1303   BILIRUBINUR n 12/01/2016 1521   KETONESUR NEGATIVE 01/01/2018 1303   PROTEINUR 100 (A) 01/01/2018 1303   UROBILINOGEN 0.2 12/01/2016 1521   UROBILINOGEN 0.2 10/15/2010 1251   NITRITE NEGATIVE 01/01/2018 1303   LEUKOCYTESUR MODERATE (A) 01/01/2018 1303    ----------------------------------------------------------------------------------------------------------------   Imaging Results:    Dg Chest 2 View  Result Date: 01/01/2018 CLINICAL DATA:  Fever.  History of bladder cancer. EXAM: CHEST  2 VIEW COMPARISON:  11/29/2017; 11/28/2017 ; CT abdomen pelvis - earlier same day FINDINGS: Grossly unchanged enlarged cardiac silhouette and mediastinal contours given reduced lung volumes. Atherosclerotic plaque within thoracic aorta. Post median sternotomy. Stable position of support apparatus. Bilateral infrahilar opacities are unchanged and favored to represent atelectasis. Unchanged diffuse slightly nodular thickening of the pulmonary interstitium. No focal airspace opacities. No pleural effusion or pneumothorax. No evidence of edema. No acute osseus abnormalities. IMPRESSION: Chronic bronchitic change without superimposed acute cardiopulmonary disease. Specifically, no evidence of pneumonia. Electronically Signed   By: Sandi Mariscal M.D.   On: 01/01/2018 14:55   Ct Abdomen Pelvis W Contrast  Result Date: 01/01/2018 CLINICAL DATA:  Bladder cancer, received first dose of chemotherapy 2 days ago, abdominal pain, fever, shortness of breath and weakness. History of  nephrostomy, colostomy, coronary artery disease post MI, hypertension, ischemic cardiomyopathy, former smoker EXAM: CT ABDOMEN AND PELVIS WITH CONTRAST TECHNIQUE: Multidetector CT imaging of the abdomen and pelvis was performed using the standard protocol following bolus administration of intravenous contrast. Sagittal and coronal MPR images reconstructed from axial data set. CONTRAST:  61mL ISOVUE-300 IOPAMIDOL (ISOVUE-300) INJECTION 61% IV. No oral contrast. COMPARISON:  12/18/2017 FINDINGS: Lower chest: Peribronchial thickening and interseptal thickening in the lower lobes greater on RIGHT. Tiny BILATERAL pleural effusions greater on RIGHT. Minimal infiltrate in RIGHT lower lobe. Questionable infiltrate versus small nodular focus 8 mm diameter image 2 in RIGHT lower lobe. Hepatobiliary: Mild gallbladder wall thickening with minimal adjacent fluid. Minimal periportal edema. Liver unremarkable. Pancreas: Normal appearance Spleen: Normal appearance Adrenals/Urinary Tract: Adrenal glands normal appearance. BILATERAL nephrostomy tubes without hydronephrosis. Cortical atrophy of LEFT kidney. No definite renal mass. Foley catheter decompresses urinary bladder. Marked bladder wall thickening evident. Stomach/Bowel: Transverse double barrel loop colostomy. Rectal wall thickening, could be related to proctitis either from infection or radiation. Stranding of perirectal fat and presacral fat planes. Stomach and remaining bowel loops unremarkable. Vascular/Lymphatic: Atherosclerotic calcifications aorta and iliac arteries. Coronary arterial calcifications. No definite adenopathy. Reproductive: Enlargement of prostate gland measuring 5.3 x 4.7 x 6.3 cm. Other: Small BILATERAL inguinal hernias LEFT larger than RIGHT containing fat. No free air or free fluid. Musculoskeletal: Prior L3-L4 posterior fusion. Degenerative disc disease changes at L2-L3 and L5-S1. Scattered Schmorl's nodes without definite metastatic bone lesions.  IMPRESSION: Diffuse bladder wall thickening which suspect related to known  tumor. Diffuse wall thickening of the rectum with perirectal and presacral infiltrative changes, question infectious proctitis versus prior radiation therapy. Prostatic enlargement. BILATERAL nephrostomy tubes without hydronephrosis. Cortical atrophy LEFT kidney. Mild gallbladder wall thickening question surrounding fluid ; recommend follow-up sonographic assessment. Minimal bibasilar effusions atelectasis as well as minimal infiltrate in RIGHT lower lobe. Question 8 mm diameter RIGHT lower lobe nodule, recommend attention on follow-up imaging. Small BILATERAL inguinal hernias larger on LEFT. Aortic Atherosclerosis (ICD10-I70.0). Electronically Signed   By: Lavonia Dana M.D.   On: 01/01/2018 14:56    Radiological Exams on Admission: Dg Chest 2 View  Result Date: 01/01/2018 CLINICAL DATA:  Fever.  History of bladder cancer. EXAM: CHEST  2 VIEW COMPARISON:  11/29/2017; 11/28/2017 ; CT abdomen pelvis - earlier same day FINDINGS: Grossly unchanged enlarged cardiac silhouette and mediastinal contours given reduced lung volumes. Atherosclerotic plaque within thoracic aorta. Post median sternotomy. Stable position of support apparatus. Bilateral infrahilar opacities are unchanged and favored to represent atelectasis. Unchanged diffuse slightly nodular thickening of the pulmonary interstitium. No focal airspace opacities. No pleural effusion or pneumothorax. No evidence of edema. No acute osseus abnormalities. IMPRESSION: Chronic bronchitic change without superimposed acute cardiopulmonary disease. Specifically, no evidence of pneumonia. Electronically Signed   By: Sandi Mariscal M.D.   On: 01/01/2018 14:55   Ct Abdomen Pelvis W Contrast  Result Date: 01/01/2018 CLINICAL DATA:  Bladder cancer, received first dose of chemotherapy 2 days ago, abdominal pain, fever, shortness of breath and weakness. History of nephrostomy, colostomy, coronary artery  disease post MI, hypertension, ischemic cardiomyopathy, former smoker EXAM: CT ABDOMEN AND PELVIS WITH CONTRAST TECHNIQUE: Multidetector CT imaging of the abdomen and pelvis was performed using the standard protocol following bolus administration of intravenous contrast. Sagittal and coronal MPR images reconstructed from axial data set. CONTRAST:  21mL ISOVUE-300 IOPAMIDOL (ISOVUE-300) INJECTION 61% IV. No oral contrast. COMPARISON:  12/18/2017 FINDINGS: Lower chest: Peribronchial thickening and interseptal thickening in the lower lobes greater on RIGHT. Tiny BILATERAL pleural effusions greater on RIGHT. Minimal infiltrate in RIGHT lower lobe. Questionable infiltrate versus small nodular focus 8 mm diameter image 2 in RIGHT lower lobe. Hepatobiliary: Mild gallbladder wall thickening with minimal adjacent fluid. Minimal periportal edema. Liver unremarkable. Pancreas: Normal appearance Spleen: Normal appearance Adrenals/Urinary Tract: Adrenal glands normal appearance. BILATERAL nephrostomy tubes without hydronephrosis. Cortical atrophy of LEFT kidney. No definite renal mass. Foley catheter decompresses urinary bladder. Marked bladder wall thickening evident. Stomach/Bowel: Transverse double barrel loop colostomy. Rectal wall thickening, could be related to proctitis either from infection or radiation. Stranding of perirectal fat and presacral fat planes. Stomach and remaining bowel loops unremarkable. Vascular/Lymphatic: Atherosclerotic calcifications aorta and iliac arteries. Coronary arterial calcifications. No definite adenopathy. Reproductive: Enlargement of prostate gland measuring 5.3 x 4.7 x 6.3 cm. Other: Small BILATERAL inguinal hernias LEFT larger than RIGHT containing fat. No free air or free fluid. Musculoskeletal: Prior L3-L4 posterior fusion. Degenerative disc disease changes at L2-L3 and L5-S1. Scattered Schmorl's nodes without definite metastatic bone lesions. IMPRESSION: Diffuse bladder wall  thickening which suspect related to known tumor. Diffuse wall thickening of the rectum with perirectal and presacral infiltrative changes, question infectious proctitis versus prior radiation therapy. Prostatic enlargement. BILATERAL nephrostomy tubes without hydronephrosis. Cortical atrophy LEFT kidney. Mild gallbladder wall thickening question surrounding fluid ; recommend follow-up sonographic assessment. Minimal bibasilar effusions atelectasis as well as minimal infiltrate in RIGHT lower lobe. Question 8 mm diameter RIGHT lower lobe nodule, recommend attention on follow-up imaging. Small BILATERAL inguinal hernias larger on LEFT.  Aortic Atherosclerosis (ICD10-I70.0). Electronically Signed   By: Lavonia Dana M.D.   On: 01/01/2018 14:56    DVT Prophylaxis -SCD AM Labs Ordered, also please review Full Orders  Family Communication: Admission, patients condition and plan of care including tests being ordered have been discussed with the patient and wife who indicate understanding and agree with the plan   Code Status - Full Code  Likely DC to  home  Condition   fair  Roxan Hockey M.D on 01/01/2018 at 6:17 PM   Between 7am to 7pm - Pager - [phone] After 7pm go to [email or web address] - password [email or web address]  [facility] - Office  [phone]  Voice Recognition Viviann Spare dictation system was used to create this note, attempts have been made to correct errors. Please contact the author with questions and/or clarifications.

## 2018-01-01 NOTE — Telephone Encounter (Signed)
Patient's wife called to report fever 102 last night, and diffuse body pain including some chest pain.  He has mild shortness of breath.  He received first dose chemotherapy carboplatin and gemcitabine for his urothelial cancer 2 days ago I recommend.  Patient coming into was along emergency room for further evaluation.  Patient agreed.  I called [facility] emergency room and spoke with the charge nurse, hopefully he will be seen soon when he arrives ED.   Truitt Merle  01/01/2018 11:58 AM

## 2018-01-01 NOTE — ED Notes (Signed)
Bed: WA18 Expected date:  Expected time:  Means of arrival:  Comments: 

## 2018-01-02 ENCOUNTER — Inpatient Hospital Stay (HOSPITAL_COMMUNITY): Payer: Medicare HMO

## 2018-01-02 ENCOUNTER — Other Ambulatory Visit: Payer: Self-pay

## 2018-01-02 DIAGNOSIS — I361 Nonrheumatic tricuspid (valve) insufficiency: Secondary | ICD-10-CM

## 2018-01-02 LAB — URINE CULTURE
Culture: NO GROWTH
Culture: NO GROWTH

## 2018-01-02 LAB — ECHOCARDIOGRAM COMPLETE
HEIGHTINCHES: 68 in
Weight: 3104 oz

## 2018-01-02 LAB — CBC
HCT: 21.2 % — ABNORMAL LOW (ref 39.0–52.0)
Hemoglobin: 7.2 g/dL — ABNORMAL LOW (ref 13.0–17.0)
MCH: 30.6 pg (ref 26.0–34.0)
MCHC: 34 g/dL (ref 30.0–36.0)
MCV: 90.2 fL (ref 78.0–100.0)
PLATELETS: 44 10*3/uL — AB (ref 150–400)
RBC: 2.35 MIL/uL — AB (ref 4.22–5.81)
RDW: 16.2 % — ABNORMAL HIGH (ref 11.5–15.5)
WBC: 6.2 10*3/uL (ref 4.0–10.5)

## 2018-01-02 LAB — COMPREHENSIVE METABOLIC PANEL
ALBUMIN: 3.1 g/dL — AB (ref 3.5–5.0)
ALK PHOS: 45 U/L (ref 38–126)
ALT: 28 U/L (ref 17–63)
ANION GAP: 5 (ref 5–15)
AST: 58 U/L — ABNORMAL HIGH (ref 15–41)
BUN: 39 mg/dL — ABNORMAL HIGH (ref 6–20)
CALCIUM: 8 mg/dL — AB (ref 8.9–10.3)
CHLORIDE: 109 mmol/L (ref 101–111)
CO2: 16 mmol/L — ABNORMAL LOW (ref 22–32)
Creatinine, Ser: 1.47 mg/dL — ABNORMAL HIGH (ref 0.61–1.24)
GFR calc non Af Amer: 43 mL/min — ABNORMAL LOW (ref >60)
GFR, EST AFRICAN AMERICAN: 50 mL/min — AB (ref >60)
GLUCOSE: 146 mg/dL — AB (ref 65–99)
Potassium: 3.9 mmol/L (ref 3.5–5.1)
SODIUM: 130 mmol/L — AB (ref 135–145)
Total Bilirubin: 0.9 mg/dL (ref 0.3–1.2)
Total Protein: 5.9 g/dL — ABNORMAL LOW (ref 6.5–8.1)

## 2018-01-02 LAB — TROPONIN I: TROPONIN I: 15.92 ng/mL — AB (ref <0.03)

## 2018-01-02 MED ORDER — PERFLUTREN LIPID MICROSPHERE
1.0000 mL | INTRAVENOUS | Status: AC | PRN
  Filled 2018-01-02: qty 10

## 2018-01-02 MED ORDER — LINACLOTIDE 145 MCG PO CAPS
290.0000 ug | ORAL_CAPSULE | Freq: Every day | ORAL | Status: DC
  Administered 2018-01-02 – 2018-01-04 (×3): 290 ug via ORAL
  Filled 2018-01-02 (×4): qty 2

## 2018-01-02 MED ORDER — IPRATROPIUM-ALBUTEROL 0.5-2.5 (3) MG/3ML IN SOLN: 3.0000 mL | Freq: Four times a day (QID) | RESPIRATORY_TRACT | Status: DC | PRN

## 2018-01-02 MED ORDER — MORPHINE SULFATE (PF) 4 MG/ML IV SOLN
2.0000 mg | INTRAVENOUS | Status: DC | PRN
  Administered 2018-01-02 – 2018-01-03 (×4): 2 mg via INTRAVENOUS
  Filled 2018-01-02 (×4): qty 1

## 2018-01-02 MED ORDER — ENSURE ENLIVE PO LIQD: 237.0000 mL | Freq: Two times a day (BID) | ORAL | Status: DC

## 2018-01-02 MED ORDER — PANTOPRAZOLE SODIUM 40 MG PO TBEC
40.0000 mg | DELAYED_RELEASE_TABLET | Freq: Every day | ORAL | Status: DC
  Administered 2018-01-02 – 2018-01-04 (×3): 40 mg via ORAL
  Filled 2018-01-02 (×2): qty 1

## 2018-01-02 MED ORDER — DIPHENHYDRAMINE HCL 25 MG PO CAPS: 25.0000 mg | ORAL_CAPSULE | Freq: Every evening | ORAL | Status: DC | PRN

## 2018-01-02 MED ORDER — OXYBUTYNIN CHLORIDE 5 MG PO TABS
5.0000 mg | ORAL_TABLET | Freq: Once | ORAL | Status: AC
  Administered 2018-01-02: 5 mg via ORAL
  Filled 2018-01-02: qty 1

## 2018-01-02 MED ORDER — ALUM & MAG HYDROXIDE-SIMETH 200-200-20 MG/5ML PO SUSP
30.0000 mL | Freq: Once | ORAL | Status: AC
Start: 2018-01-02 — End: 2018-01-02
  Administered 2018-01-02: 30 mL via ORAL
  Filled 2018-01-02: qty 30

## 2018-01-02 MED ORDER — PERFLUTREN LIPID MICROSPHERE
1.0000 mL | INTRAVENOUS | Status: AC | PRN
  Administered 2018-01-02: 2 mL via INTRAVENOUS
  Filled 2018-01-02: qty 10

## 2018-01-02 NOTE — Progress Notes (Signed)
Patient Demographics:    Charles Coffey, is a 83 y.o. male, DOB - [birth year], TTS:[id number]  Admit date - 01/01/2018   Admitting Physician Kazimir Hartnett Denton Brick, MD  Outpatient Primary MD for the patient is [name], MD  LOS - 1   Chief Complaint  Patient presents with  . Fever        Subjective:    [name] today has no fevers, no emesis,  No chest pain, wife at bedside, Dr. Tresa Moore at bedside, no diarrhea, no dysuria  Assessment  & Plan :    Principal Problem:   Sepsis secondary to UTI [name]) Active Problems:   Essential hypertension   Anemia   Elevated troponin   Urothelial carcinoma of bladder (HCC)   Thrombocytopenia ([place])   Acute lower UTI  Brief summary :-  Charles Coffey  is a 83 y.o. male  With pmhx relevant for invasive urothelial carcinoma who is s/p cystoscopy and left retrograde pyelogram with TURBT on 11/25/17, subsequently found to have  bilateral hydronephrosis likely from malignant obstruction and pyelo and underwent left PCN on 11/28/17 , pt is also s/p Diverting Colostomy which was performed on 12/01/17, subsequently had  Right PCN tube on 12/03/17. Pt  Received palliative chemo- as per  Dr. [name] 1st dose of carboplatin and gemcitabine given 12/30/17. 1 of the nephrostomy tubes is leaking and was collecting in pt's bladder, Dr. [name] (urology)placed a foley catheter. Urine was  infected, so he was put on cipro. Pt developed a fever of 102 at home on 12/31/17, admitted 01/01/18 with sepsis from presumed urinary source and elevated troponin in the setting of sepsis.   Plan:-  1)Sepsis secondary to urinary source-patient with complicated UTI history of prior invasive urothelial carcinoma with hydronephrosis, Foley removed 01/02/18 by Dr Tresa Moore, pt has bilateral nephrostomy tubes in situ- c/n  IV cefepime (started 01/01/18) pending urine and blood cultures, c/n  IV hydration, recent  urine culture from urology clinic grew Citrobacter sensitive to Rocephin and Cipro, prelim urine cx from 01/01/18 with Staph Aureus, culture pharmacist, white count is better, patient is clinically looking better, afebrile, will hold off on vancomycin for now pending sensitivities of the staph  2)Elevated Troponin- No chest pain at this time, echocardiogram from 01/02/18 with EF in 25-30% range (down from 50 %) with diffuse hypokinesis and findings of both systolic and diastolic dysfunction EKG without acute ST elevation, ED provider discussed this with on-call cardiologist Dr. Pierre Bali recommended baby aspirin and conservative treatment without full anticoagulation given thrombocytopenia.  patient apparently is not a candidate for intervention at this time. Please note that the pt had LHC on 08/25/17 with Significant underlying three-vessel coronary artery disease with patent LIMA to LAD and SVG to OM 2. RIMA to RCA is atretic. The right coronary artery is occluded distally with extensive collaterals from the LAD,  C/n Coreg,  aspirin 81 mg, pt is intolerant to statin  3)Obstructive Uropathy/metastatic bladder cancer since 10/2017-  With pmhx relevant for invasive urothelial carcinoma who is s/p cystoscopy and left retrograde pyelogram with TURBT on 11/25/17, subsequently found to have  bilateral hydronephrosis likely from malignant obstruction and pyelo and underwent left PCN on 11/28/17 , pt is also s/p Diverting Colostomy which was performed  on 12/01/17, subsequently had  Right PCN tube on 12/03/17. Pt  Received palliative chemo- as per  Dr. [name] 1st dose of carboplatin and gemcitabine given 12/30/17. 1 of the nephrostomy tubes is leaking and was collecting in pt's bladder, Dr. [name] (urology) , please see urology consult from Dr. Tammi Klippel dated 01/02/2018  4)CKD III-renal function appears to be at baseline with creatinine around 1.6, avoid nephrotoxic agents maintain adequate hydration   Code Status :  Full   Disposition Plan  : Home   Consults  :  Urology, phone consult with pharmacist   DVT Prophylaxis  :   SCDs    Lab Results  Component Value Date   PLT 44 (L) 01/02/2018    Inpatient Medications  Scheduled Meds: . aspirin EC  81 mg Oral Daily  . carvedilol  6.25 mg Oral BID WC  . dicyclomine  10 mg Oral TID AC & HS  . feeding supplement (ENSURE ENLIVE)  237 mL Oral BID BM  . isosorbide mononitrate  60 mg Oral Daily  . linaclotide  290 mcg Oral QAC breakfast  . lubiprostone  24 mcg Oral BID WC  . pantoprazole  40 mg Oral Daily  . pramipexole  1.5 mg Oral BID   Continuous Infusions: . sodium chloride 150 mL/hr at 01/02/18 1537  . ceFEPime (MAXIPIME) IV 2 g (01/02/18 1426)   PRN Meds:.acetaminophen, diphenhydrAMINE, hydrocortisone, morphine injection, naphazoline-pheniramine, nitroGLYCERIN, oxyCODONE, prochlorperazine, temazepam, traMADol    Anti-infectives (From admission, onward)   Start     Dose/Rate Route Frequency Ordered Stop   01/02/18 1200  ceFEPIme (MAXIPIME) 2 g in dextrose 5 % 50 mL IVPB     2 g 100 mL/hr over 30 Minutes Intravenous Every 24 hours 01/01/18 1659     01/01/18 1330  ceFEPIme (MAXIPIME) 2 g in dextrose 5 % 50 mL IVPB  Status:  Discontinued     2 g 100 mL/hr over 30 Minutes Intravenous  Once 01/01/18 1324 01/01/18 2156   01/01/18 1300  ceFEPIme (MAXIPIME) 2 g in dextrose 5 % 50 mL IVPB     2 g 100 mL/hr over 30 Minutes Intravenous  Once 01/01/18 1254 01/01/18 1451        Objective:   Vitals:   01/01/18 2159 01/02/18 0514 01/02/18 1100 01/02/18 1444  BP: 109/64 108/72 110/69 105/68  Pulse: 79 80 71 71  Resp: 18 18    Temp: 98.1 F (36.7 C) 98.3 F (36.8 C)    TempSrc: Oral Oral  Oral  SpO2: 95% 96%  99%  Weight: 88 kg (194 lb)     Height: '5\' 8"'$  (1.727 m)       Wt Readings from Last 3 Encounters:  01/01/18 88 kg (194 lb)  12/27/17 85.3 kg (188 lb)  12/15/17 85.6 kg (188 lb 11.2 oz)     Intake/Output Summary (Last 24  hours) at 01/02/2018 1735 Last data filed at 01/02/2018 0600 Gross per 24 hour  Intake 1785 ml  Output 1900 ml  Net -115 ml     Physical Exam  Gen:- Awake Alert,  In no apparent distress  HEENT:- [place].AT, No sclera icterus, HOH Neck-Supple Neck,No JVD,.  Lungs-somewhat diminished in bases, no significant adventitious sounds CV- S1, S2 normal, CABG scar Abd-  +ve B.Sounds, Abd Soft, No tenderness, bilateral nephrostomy tubes, colostomy bag noted Extremity/Skin:- No  edema,    Psych-appropriate affect Neuro-no new focal deficits no tremors   Data Review:   Micro Results Recent Results (from the  past 240 hour(s))  Blood culture (routine x 2)     Status: None (Preliminary result)   Collection Time: 01/01/18  1:04 PM  Result Value Ref Range Status   Specimen Description BLOOD RIGHT PORTA CATH  Final   Special Requests   Final    BOTTLES DRAWN AEROBIC AND ANAEROBIC Blood Culture adequate volume   Culture   Final    NO GROWTH < 24 HOURS Performed at [facility], [address] [place], [place] [address]    Report Status PENDING  Incomplete  Blood culture (routine x 2)     Status: None (Preliminary result)   Collection Time: 01/01/18  1:04 PM  Result Value Ref Range Status   Specimen Description BLOOD RIGHT ANTECUBITAL  Final   Special Requests   Final    BOTTLES DRAWN AEROBIC AND ANAEROBIC Blood Culture adequate volume   Culture   Final    NO GROWTH < 24 HOURS Performed at [facility], [place] [address] [place], [place] [address]    Report Status PENDING  Incomplete  Urine culture     Status: None   Collection Time: 01/01/18  1:04 PM  Result Value Ref Range Status   Specimen Description URINE, RANDOM  Final   Special Requests BLADDER  Final   Culture   Final    NO GROWTH Performed at [facility], [address] [place], [place] [address]    Report Status 01/02/2018 FINAL  Final  Urine culture     Status: Abnormal (Preliminary result)   Collection  Time: 01/01/18  1:04 PM  Result Value Ref Range Status   Specimen Description URINE, RANDOM  Final   Special Requests BLADDER  Final   Culture >=100,000 COLONIES/mL STAPHYLOCOCCUS AUREUS (A)  Final   Report Status PENDING  Incomplete  Urine culture     Status: None   Collection Time: 01/01/18  3:09 PM  Result Value Ref Range Status   Specimen Description URINE, RANDOM  Final   Special Requests LEFT NEPHROSTOMY  Final   Culture   Final    NO GROWTH Performed at [facility], [place] [address] [place], [place] [address]    Report Status 01/02/2018 FINAL  Final    Radiology Reports Dg Chest 2 View  Result Date: 01/01/2018 CLINICAL DATA:  Fever.  History of bladder cancer. EXAM: CHEST  2 VIEW COMPARISON:  11/29/2017; 11/28/2017 ; CT abdomen pelvis - earlier same day FINDINGS: Grossly unchanged enlarged cardiac silhouette and mediastinal contours given reduced lung volumes. Atherosclerotic plaque within thoracic aorta. Post median sternotomy. Stable position of support apparatus. Bilateral infrahilar opacities are unchanged and favored to represent atelectasis. Unchanged diffuse slightly nodular thickening of the pulmonary interstitium. No focal airspace opacities. No pleural effusion or pneumothorax. No evidence of edema. No acute osseus abnormalities. IMPRESSION: Chronic bronchitic change without superimposed acute cardiopulmonary disease. Specifically, no evidence of pneumonia. Electronically Signed   By: Sandi Mariscal M.D.   On: 01/01/2018 14:55   Ct Abdomen Pelvis W Contrast  Result Date: 01/01/2018 CLINICAL DATA:  Bladder cancer, received first dose of chemotherapy 2 days ago, abdominal pain, fever, shortness of breath and weakness. History of nephrostomy, colostomy, coronary artery disease post MI, hypertension, ischemic cardiomyopathy, former smoker EXAM: CT ABDOMEN AND PELVIS WITH CONTRAST TECHNIQUE: Multidetector CT imaging of the abdomen and pelvis was performed using the standard  protocol following bolus administration of intravenous contrast. Sagittal and coronal MPR images reconstructed from axial data  set. CONTRAST:  31m ISOVUE-300 IOPAMIDOL (ISOVUE-300) INJECTION 61% IV. No oral contrast. COMPARISON:  12/18/2017 FINDINGS: Lower chest: Peribronchial thickening and interseptal thickening in the lower lobes greater on RIGHT. Tiny BILATERAL pleural effusions greater on RIGHT. Minimal infiltrate in RIGHT lower lobe. Questionable infiltrate versus small nodular focus 8 mm diameter image 2 in RIGHT lower lobe. Hepatobiliary: Mild gallbladder wall thickening with minimal adjacent fluid. Minimal periportal edema. Liver unremarkable. Pancreas: Normal appearance Spleen: Normal appearance Adrenals/Urinary Tract: Adrenal glands normal appearance. BILATERAL nephrostomy tubes without hydronephrosis. Cortical atrophy of LEFT kidney. No definite renal mass. Foley catheter decompresses urinary bladder. Marked bladder wall thickening evident. Stomach/Bowel: Transverse double barrel loop colostomy. Rectal wall thickening, could be related to proctitis either from infection or radiation. Stranding of perirectal fat and presacral fat planes. Stomach and remaining bowel loops unremarkable. Vascular/Lymphatic: Atherosclerotic calcifications aorta and iliac arteries. Coronary arterial calcifications. No definite adenopathy. Reproductive: Enlargement of prostate gland measuring 5.3 x 4.7 x 6.3 cm. Other: Small BILATERAL inguinal hernias LEFT larger than RIGHT containing fat. No free air or free fluid. Musculoskeletal: Prior L3-L4 posterior fusion. Degenerative disc disease changes at L2-L3 and L5-S1. Scattered Schmorl's nodes without definite metastatic bone lesions. IMPRESSION: Diffuse bladder wall thickening which suspect related to known tumor. Diffuse wall thickening of the rectum with perirectal and presacral infiltrative changes, question infectious proctitis versus prior radiation therapy. Prostatic  enlargement. BILATERAL nephrostomy tubes without hydronephrosis. Cortical atrophy LEFT kidney. Mild gallbladder wall thickening question surrounding fluid ; recommend follow-up sonographic assessment. Minimal bibasilar effusions atelectasis as well as minimal infiltrate in RIGHT lower lobe. Question 8 mm diameter RIGHT lower lobe nodule, recommend attention on follow-up imaging. Small BILATERAL inguinal hernias larger on LEFT. Aortic Atherosclerosis (ICD10-I70.0). Electronically Signed   By: MLavonia DanaM.D.   On: 01/01/2018 14:56   Ir Fluoro Guide Cv Line Right  Result Date: 12/27/2017 INDICATION: History of bladder cancer. In need of durable intravenous access for chemotherapy administration. EXAM: IMPLANTED PORT A CATH PLACEMENT WITH ULTRASOUND AND FLUOROSCOPIC GUIDANCE COMPARISON:  None. MEDICATIONS: Ancef 2 gm IV; The antibiotic was administered within an appropriate time interval prior to skin puncture. ANESTHESIA/SEDATION: Moderate (conscious) sedation was employed during this procedure. A total of Versed 2 mg and Fentanyl 100 mcg was administered intravenously. Moderate Sedation Time: 27 minutes. The patient's level of consciousness and vital signs were monitored continuously by radiology nursing throughout the procedure under my direct supervision. CONTRAST:  None FLUOROSCOPY TIME:  54 seconds (23 mGy) COMPLICATIONS: None immediate. PROCEDURE: The procedure, risks, benefits, and alternatives were explained to the patient. Questions regarding the procedure were encouraged and answered. The patient understands and consents to the procedure. The right neck and chest were prepped with chlorhexidine in a sterile fashion, and a sterile drape was applied covering the operative field. Maximum barrier sterile technique with sterile gowns and gloves were used for the procedure. A timeout was performed prior to the initiation of the procedure. Local anesthesia was provided with 1% lidocaine with epinephrine.  After creating a small venotomy incision, a micropuncture kit was utilized to access the internal jugular vein. Real-time ultrasound guidance was utilized for vascular access including the acquisition of a permanent ultrasound image documenting patency of the accessed vessel. The microwire was utilized to measure appropriate catheter length. A subcutaneous port pocket was then created along the upper chest wall utilizing a combination of sharp and blunt dissection. The pocket was irrigated with sterile saline. A single lumen thin power injectable port was chosen for placement.  The 8 Fr catheter was tunneled from the port pocket site to the venotomy incision. The port was placed in the pocket. The external catheter was trimmed to appropriate length. At the venotomy, an 8 Fr peel-away sheath was placed over a guidewire under fluoroscopic guidance. The catheter was then placed through the sheath and the sheath was removed. Final catheter positioning was confirmed and documented with a fluoroscopic spot radiograph. The port was accessed with a Huber needle, aspirated and flushed with heparinized saline. The venotomy site was closed with an interrupted 4-0 Vicryl suture. The port pocket incision was closed with interrupted 2-0 Vicryl suture and the skin was opposed with a running subcuticular 4-0 Vicryl suture. Dermabond and Steri-strips were applied to both incisions. Dressings were placed. The patient tolerated the procedure well without immediate post procedural complication. FINDINGS: After catheter placement, the tip lies within the superior cavoatrial junction. The catheter aspirates and flushes normally and is ready for immediate use. IMPRESSION: Successful placement of a right internal jugular approach power injectable Port-A-Cath. The catheter is ready for immediate use. Electronically Signed   By: Sandi Mariscal M.D.   On: 12/27/2017 17:07   Ir US Guide Vasc Access Right  Result Date: 12/27/2017 INDICATION:  History of bladder cancer. In need of durable intravenous access for chemotherapy administration. EXAM: IMPLANTED PORT A CATH PLACEMENT WITH ULTRASOUND AND FLUOROSCOPIC GUIDANCE COMPARISON:  None. MEDICATIONS: Ancef 2 gm IV; The antibiotic was administered within an appropriate time interval prior to skin puncture. ANESTHESIA/SEDATION: Moderate (conscious) sedation was employed during this procedure. A total of Versed 2 mg and Fentanyl 100 mcg was administered intravenously. Moderate Sedation Time: 27 minutes. The patient's level of consciousness and vital signs were monitored continuously by radiology nursing throughout the procedure under my direct supervision. CONTRAST:  None FLUOROSCOPY TIME:  54 seconds (23 mGy) COMPLICATIONS: None immediate. PROCEDURE: The procedure, risks, benefits, and alternatives were explained to the patient. Questions regarding the procedure were encouraged and answered. The patient understands and consents to the procedure. The right neck and chest were prepped with chlorhexidine in a sterile fashion, and a sterile drape was applied covering the operative field. Maximum barrier sterile technique with sterile gowns and gloves were used for the procedure. A timeout was performed prior to the initiation of the procedure. Local anesthesia was provided with 1% lidocaine with epinephrine. After creating a small venotomy incision, a micropuncture kit was utilized to access the internal jugular vein. Real-time ultrasound guidance was utilized for vascular access including the acquisition of a permanent ultrasound image documenting patency of the accessed vessel. The microwire was utilized to measure appropriate catheter length. A subcutaneous port pocket was then created along the upper chest wall utilizing a combination of sharp and blunt dissection. The pocket was irrigated with sterile saline. A single lumen thin power injectable port was chosen for placement. The 8 Fr catheter was tunneled  from the port pocket site to the venotomy incision. The port was placed in the pocket. The external catheter was trimmed to appropriate length. At the venotomy, an 8 Fr peel-away sheath was placed over a guidewire under fluoroscopic guidance. The catheter was then placed through the sheath and the sheath was removed. Final catheter positioning was confirmed and documented with a fluoroscopic spot radiograph. The port was accessed with a Huber needle, aspirated and flushed with heparinized saline. The venotomy site was closed with an interrupted 4-0 Vicryl suture. The port pocket incision was closed with interrupted 2-0 Vicryl suture and the skin  was opposed with a running subcuticular 4-0 Vicryl suture. Dermabond and Steri-strips were applied to both incisions. Dressings were placed. The patient tolerated the procedure well without immediate post procedural complication. FINDINGS: After catheter placement, the tip lies within the superior cavoatrial junction. The catheter aspirates and flushes normally and is ready for immediate use. IMPRESSION: Successful placement of a right internal jugular approach power injectable Port-A-Cath. The catheter is ready for immediate use. Electronically Signed   By: Sandi Mariscal M.D.   On: 12/27/2017 17:07   Ir Nephrostogram Left Thru Existing Access  Result Date: 12/24/2017 INDICATION: Right flank pain.  Right nephrostomy in place. EXAM: RIGHT NEPHROSTOMY EXCHANGE AND RIGHT NEPHROSTOGRAM COMPARISON:  None. MEDICATIONS: None. ANESTHESIA/SEDATION: Fentanyl  mcg IV; Versed  mg IV Moderate Sedation Time: The patient was continuously monitored during the procedure by the interventional radiology nurse under my direct supervision. CONTRAST:  - administered into the collecting system(s) FLUOROSCOPY TIME:  Fluoroscopy Time:  minutes 30 seconds ( mGy). COMPLICATIONS: None immediate. PROCEDURE: Informed written consent was obtained from the patient after a thorough discussion of the  procedural risks, benefits and alternatives. All questions were addressed. Maximal Sterile Barrier Technique was utilized including caps, mask, sterile gowns, sterile gloves, sterile drape, hand hygiene and skin antiseptic. A timeout was performed prior to the initiation of the procedure. Contrast was injected into the existing right nephrostomy. It was then cut and exchanged over a Bentson wire for a new 10 French nephrostomy catheter. The new catheter is now coiled in the renal pelvis. It was looped and string fixed then sewn to the skin. Contrast was injected. FINDINGS: Nephrostogram through the existing right nephrostomy demonstrates that the catheter tip is coiled in a calyx. The new catheter is appropriately positioned and coiled in the renal pelvis. IMPRESSION: Successful right nephrostomy catheter exchange. Electronically Signed   By: Marybelle Killings M.D.   On: 12/24/2017 13:51   Ir Nephrostomy Exchange Right  Result Date: 12/24/2017 INDICATION: Right flank pain.  Right nephrostomy in place. EXAM: RIGHT NEPHROSTOMY EXCHANGE AND RIGHT NEPHROSTOGRAM COMPARISON:  None. MEDICATIONS: None. ANESTHESIA/SEDATION: Fentanyl  mcg IV; Versed  mg IV Moderate Sedation Time: The patient was continuously monitored during the procedure by the interventional radiology nurse under my direct supervision. CONTRAST:  - administered into the collecting system(s) FLUOROSCOPY TIME:  Fluoroscopy Time:  minutes 30 seconds ( mGy). COMPLICATIONS: None immediate. PROCEDURE: Informed written consent was obtained from the patient after a thorough discussion of the procedural risks, benefits and alternatives. All questions were addressed. Maximal Sterile Barrier Technique was utilized including caps, mask, sterile gowns, sterile gloves, sterile drape, hand hygiene and skin antiseptic. A timeout was performed prior to the initiation of the procedure. Contrast was injected into the existing right nephrostomy. It was then cut and exchanged  over a Bentson wire for a new 10 French nephrostomy catheter. The new catheter is now coiled in the renal pelvis. It was looped and string fixed then sewn to the skin. Contrast was injected. FINDINGS: Nephrostogram through the existing right nephrostomy demonstrates that the catheter tip is coiled in a calyx. The new catheter is appropriately positioned and coiled in the renal pelvis. IMPRESSION: Successful right nephrostomy catheter exchange. Electronically Signed   By: Marybelle Killings M.D.   On: 12/24/2017 13:51     CBC Recent Labs  Lab 12/27/17 1227 12/29/17 1014 01/01/18 1303 01/02/18 1055  WBC 6.2 4.2 12.5* 6.2  HGB 8.6* 8.8* 7.5* 7.2*  HCT 24.9* 26.0* 21.9* 21.2*  PLT 73*  70* 56* 44*  MCV 89.2 89.3 89.8 90.2  MCH 30.8 30.2 30.7 30.6  MCHC 34.5 33.8 34.2 34.0  RDW 15.6* 15.5* 15.9* 16.2*  LYMPHSABS  --  1.4 1.3  --   MONOABS  --  0.6 0.1  --   EOSABS  --  0.2 0.0  --   BASOSABS  --  0.0 0.0  --     Chemistries  Recent Labs  Lab 12/27/17 1227 12/29/17 1014 01/01/18 1303 01/02/18 1055  NA 132* 133* 129* 130*  K 4.7 4.4 4.5 3.9  CL 106  --  104 109  CO2 19* 20* 18* 16*  GLUCOSE 108* 102 97 146*  BUN 37* 31.5* 50* 39*  CREATININE 1.78* 1.6* 1.66* 1.47*  CALCIUM 9.4 9.6 8.8* 8.0*  AST  --  15 90* 58*  ALT  --  '13 30 28  '$ ALKPHOS  --  61 47 45  BILITOT  --  0.43 1.3* 0.9   ------------------------------------------------------------------------------------------------------------------ No results for input(s): CHOL, HDL, LDLCALC, TRIG, CHOLHDL, LDLDIRECT in the last 72 hours.  No results found for: HGBA1C ------------------------------------------------------------------------------------------------------------------ No results for input(s): TSH, T4TOTAL, T3FREE, THYROIDAB in the last 72 hours.  Invalid input(s): FREET3 ------------------------------------------------------------------------------------------------------------------ No results for input(s):  VITAMINB12, FOLATE, FERRITIN, TIBC, IRON, RETICCTPCT in the last 72 hours.  Coagulation profile Recent Labs  Lab 12/27/17 1227  INR 1.13    No results for input(s): DDIMER in the last 72 hours.  Cardiac Enzymes Recent Labs  Lab 01/01/18 1303 01/02/18 1055  TROPONINI 26.68* 15.92*   ------------------------------------------------------------------------------------------------------------------ No results found for: BNP   Roxan Hockey M.D on 01/02/2018 at 5:35 PM  Between 7am to 7pm - Pager - [phone]  After 7pm go to [email or web address] - password [email or web address]  [facility] -  Office  [phone]   Voice Recognition Viviann Spare dictation system was used to create this note, attempts have been made to correct errors. Please contact the author with questions and/or clarifications.

## 2018-01-02 NOTE — Progress Notes (Signed)
CRITICAL VALUE ALERT  Critical Value:  Troponin 15.92  Date & Time Notied:  1/6 1430  Provider Notified: 1/6 1445  Orders Received/Actions taken: 1/6 MD aware.No new orders

## 2018-01-02 NOTE — Consult Note (Signed)
Reason for Consult:Metastatic Bladder Cancer, Stage 3 Renal Insuficiency with Malignant Ureteral Obstruction, Pelvic Pain / Sensation of Urinary Urgency  Referring Physician: Roxan Hockey MD  Charles Coffey is an 83 y.o. male.   HPI:   1 - Metastatic Bladder Cancer - high grade urothelial carcinoma involving bladder, left ureter, rectum since 10/2017. On palliative chemo with Gemcitibine / Carboplatinum per Dr. [name] and s/p palliative diverting colostomy by [facility] 11/2017. CT 01/01/18 this admission with stable disease burden.,   2 - Stage 3 Renal Insuficiency with Malignant Ureteral Obstruction - s/p bilaeral neph tubes for malignant obstruction. CT 01/02/18 this admission with neph tubes in good position, no hydro, and Cr at basline 1.6.  3 - Pelvic Pain / Sensation of Urinary Urgency - sensation of pelvic pain / urgency x weeks. He has known locally advanced bladder cancer. NO help with foley which confirms zero residual urine, therefore removed again.   4 - Fever, Malaise, Possible Pyelonephritis - admitted 01/01/18 with fevers and maliase. Most recent [place] 12/30/17 from Urol office with Citrobacter sensitive to cipro, rocephin. He has been on CX-specific Cipro. UA this admission unimpressive. Placed on empiric cefipime pending furhter CX data.   Today "Charles Coffey " is seen in consultation for above. He has TropI elevation and anemia as well.   Past Medical History:  Diagnosis Date  . Aortic atherosclerosis ([place])   . BPH with urinary obstruction   . CAD (coronary artery disease)    a.  MI 1995, CABG x 3 2002 (patient says that he had LIMA and RIMA grafts). b. ETT-Cardiolite (10/15) with EF 48%, apical scar, no ischemia. c. Nuc 07/2017 abnormal -> cath was performed,  patent LIMA to LAD and SVG to OM 2. RIMA to RCA is atretic. The right coronary artery is occluded distally with extensive collaterals from the LAD, medical therapy.   . Cancer [facility])    bladder cancer  . Cervical spondylosis  without myelopathy 02-23-2016  . Chronic low back pain    sees Dr. Kary Kos   . GERD (gastroesophageal reflux disease)   . Hiatal hernia   . Hyperlipidemia   . Hypertension   . IBS (irritable bowel syndrome)   . Ischemic cardiomyopathy   . Myocardial infarction ([place])   . RBBB   . Restless legs syndrome   . Statin intolerance   . Syncope    a. in 2015 - no apparent cause, was taking sleep medicine at the time. Cardiac workup unremarkable.  . Tubular adenoma of colon     Past Surgical History:  Procedure Laterality Date  . COLONOSCOPY  10/17/2014   per Dr. Hilarie Fredrickson, tubular adenomas, repeat in 3 yrs   . COLONOSCOPY WITH PROPOFOL N/A 12/01/2017   Procedure: COLONOSCOPY WITH PROPOFOL;  Surgeon: Milus Banister, MD;  Location: [facility];  Service: Endoscopy;  Laterality: N/A;  . CYSTOSCOPY W/ RETROGRADES Left 11/25/2017   Procedure: CYSTOSCOPY WITH RETROGRADE PYELOGRAM;  Surgeon: Irine Seal, MD;  Location: [facility] ORS;  Service: Urology;  Laterality: Left;  . HEART BYPASS    . IR FLUORO GUIDE CV LINE RIGHT  12/27/2017  . IR NEPHROSTOGRAM LEFT THRU EXISTING ACCESS  12/24/2017  . IR NEPHROSTOMY EXCHANGE RIGHT  12/24/2017  . IR NEPHROSTOMY PLACEMENT LEFT  11/28/2017  . IR NEPHROSTOMY PLACEMENT RIGHT  12/03/2017  . IR US GUIDE VASC ACCESS RIGHT  12/27/2017  . LAPAROSCOPY N/A 12/01/2017   Procedure: LAPAROSCOPIC DIVERTING OSTOMY;  Surgeon: Stark Klein, MD;  Location: [facility] ORS;  Service:  General;  Laterality: N/A;  . LEFT HEART CATH AND CORS/GRAFTS ANGIOGRAPHY N/A 08/25/2017   Procedure: LEFT HEART CATH AND CORS/GRAFTS ANGIOGRAPHY;  Surgeon: Wellington Hampshire, MD;  Location: [place] CV LAB;  Service: Cardiovascular;  Laterality: N/A;  . LUMBAR FUSION  2003   L3-L4  . PROSTATE SURGERY  06-27-12   per Dr. Roni Bread, had CTT  . TONSILLECTOMY    . TRANSURETHRAL RESECTION OF BLADDER TUMOR N/A 11/25/2017   Procedure: TRANSURETHRAL RESECTION OF BLADDER TUMOR (TURBT);  Surgeon: Irine Seal, MD;   Location: [facility];  Service: Urology;  Laterality: N/A;    Family History  Problem Relation Age of Onset  . Heart disease Mother   . Heart attack Mother   . Aneurysm Father        femoral artery  . Heart disease Brother   . Heart disease Maternal Uncle        x 2  . Colon cancer Neg Hx   . Esophageal cancer Neg Hx   . Pancreatic cancer Neg Hx   . Kidney disease Neg Hx   . Liver disease Neg Hx     Social History:  reports that he quit smoking about 39 years ago. His smoking use included cigarettes. he has never used smokeless tobacco. He reports that he drinks alcohol. He reports that he does not use drugs.  Allergies:  Allergies  Allergen Reactions  . Antihistamines, Loratadine-Type Other (See Comments)    Unable to urinate  . Statins Other (See Comments)    liver effects    Medications: I have reviewed the patient's current medications.  Results for orders placed or performed during the hospital encounter of 01/01/18 (from the past 48 hour(s))  Comprehensive metabolic panel     Status: Abnormal   Collection Time: 01/01/18  1:03 PM  Result Value Ref Range   Sodium 129 (L) 135 - 145 mmol/L   Potassium 4.5 3.5 - 5.1 mmol/L   Chloride 104 101 - 111 mmol/L   CO2 18 (L) 22 - 32 mmol/L   Glucose, Bld 97 65 - 99 mg/dL   BUN 50 (H) 6 - 20 mg/dL   Creatinine, Ser 1.66 (H) 0.61 - 1.24 mg/dL   Calcium 8.8 (L) 8.9 - 10.3 mg/dL   Total Protein 6.2 (L) 6.5 - 8.1 g/dL   Albumin 3.5 3.5 - 5.0 g/dL   AST 90 (H) 15 - 41 U/L   ALT 30 17 - 63 U/L   Alkaline Phosphatase 47 38 - 126 U/L   Total Bilirubin 1.3 (H) 0.3 - 1.2 mg/dL   GFR calc non Af Amer 37 (L) >60 mL/min   GFR calc Af Amer 43 (L) >60 mL/min    Comment: (NOTE) The eGFR has been calculated using the CKD EPI equation. This calculation has not been validated in all clinical situations. eGFR's persistently <60 mL/min signify possible Chronic Kidney Disease.    Anion gap 7 5 - 15  CBC with Differential     Status: Abnormal    Collection Time: 01/01/18  1:03 PM  Result Value Ref Range   WBC 12.5 (H) 4.0 - 10.5 K/uL   RBC 2.44 (L) 4.22 - 5.81 MIL/uL   Hemoglobin 7.5 (L) 13.0 - 17.0 g/dL   HCT 21.9 (L) 39.0 - 52.0 %   MCV 89.8 78.0 - 100.0 fL   MCH 30.7 26.0 - 34.0 pg   MCHC 34.2 30.0 - 36.0 g/dL   RDW 15.9 (H) 11.5 - 15.5 %  Platelets 56 (L) 150 - 400 K/uL    Comment: SPECIMEN CHECKED FOR CLOTS REPEATED TO VERIFY PLATELET COUNT CONFIRMED BY SMEAR    Neutrophils Relative % 89 %   Lymphocytes Relative 10 %   Monocytes Relative 1 %   Eosinophils Relative 0 %   Basophils Relative 0 %   Neutro Abs 11.1 (H) 1.7 - 7.7 K/uL   Lymphs Abs 1.3 0.7 - 4.0 K/uL   Monocytes Absolute 0.1 0.1 - 1.0 K/uL   Eosinophils Absolute 0.0 0.0 - 0.7 K/uL   Basophils Absolute 0.0 0.0 - 0.1 K/uL   RBC Morphology ELLIPTOCYTES   Urinalysis, Routine w reflex microscopic     Status: Abnormal   Collection Time: 01/01/18  1:03 PM  Result Value Ref Range   Color, Urine YELLOW YELLOW   APPearance CLEAR CLEAR   Specific Gravity, Urine 1.020 1.005 - 1.030   pH 5.0 5.0 - 8.0   Glucose, UA NEGATIVE NEGATIVE mg/dL   Hgb urine dipstick LARGE (A) NEGATIVE   Bilirubin Urine NEGATIVE NEGATIVE   Ketones, ur NEGATIVE NEGATIVE mg/dL   Protein, ur 100 (A) NEGATIVE mg/dL   Nitrite NEGATIVE NEGATIVE   Leukocytes, UA MODERATE (A) NEGATIVE   RBC / HPF TOO NUMEROUS TO COUNT 0 - 5 RBC/hpf   WBC, UA 6-30 0 - 5 WBC/hpf   Bacteria, UA RARE (A) NONE SEEN   Squamous Epithelial / LPF NONE SEEN NONE SEEN   Mucus PRESENT   Troponin I     Status: Abnormal   Collection Time: 01/01/18  1:03 PM  Result Value Ref Range   Troponin I 26.68 (HH) <0.03 ng/mL    Comment: CRITICAL RESULT CALLED TO, READ BACK BY AND VERIFIED WITH: DOWD,P AT 2:00PM ON 01/01/18 BY FESTERMAN,C   I-Stat CG4 Lactic Acid, ED     Status: None   Collection Time: 01/01/18  1:19 PM  Result Value Ref Range   Lactic Acid, Venous 1.16 0.5 - 1.9 mmol/L    Dg Chest 2 View  Result  Date: 01/01/2018 CLINICAL DATA:  Fever.  History of bladder cancer. EXAM: CHEST  2 VIEW COMPARISON:  11/29/2017; 11/28/2017 ; CT abdomen pelvis - earlier same day FINDINGS: Grossly unchanged enlarged cardiac silhouette and mediastinal contours given reduced lung volumes. Atherosclerotic plaque within thoracic aorta. Post median sternotomy. Stable position of support apparatus. Bilateral infrahilar opacities are unchanged and favored to represent atelectasis. Unchanged diffuse slightly nodular thickening of the pulmonary interstitium. No focal airspace opacities. No pleural effusion or pneumothorax. No evidence of edema. No acute osseus abnormalities. IMPRESSION: Chronic bronchitic change without superimposed acute cardiopulmonary disease. Specifically, no evidence of pneumonia. Electronically Signed   By: Sandi Mariscal M.D.   On: 01/01/2018 14:55   Ct Abdomen Pelvis W Contrast  Result Date: 01/01/2018 CLINICAL DATA:  Bladder cancer, received first dose of chemotherapy 2 days ago, abdominal pain, fever, shortness of breath and weakness. History of nephrostomy, colostomy, coronary artery disease post MI, hypertension, ischemic cardiomyopathy, former smoker EXAM: CT ABDOMEN AND PELVIS WITH CONTRAST TECHNIQUE: Multidetector CT imaging of the abdomen and pelvis was performed using the standard protocol following bolus administration of intravenous contrast. Sagittal and coronal MPR images reconstructed from axial data set. CONTRAST:  60m ISOVUE-300 IOPAMIDOL (ISOVUE-300) INJECTION 61% IV. No oral contrast. COMPARISON:  12/18/2017 FINDINGS: Lower chest: Peribronchial thickening and interseptal thickening in the lower lobes greater on RIGHT. Tiny BILATERAL pleural effusions greater on RIGHT. Minimal infiltrate in RIGHT lower lobe. Questionable infiltrate versus small nodular focus  8 mm diameter image 2 in RIGHT lower lobe. Hepatobiliary: Mild gallbladder wall thickening with minimal adjacent fluid. Minimal periportal  edema. Liver unremarkable. Pancreas: Normal appearance Spleen: Normal appearance Adrenals/Urinary Tract: Adrenal glands normal appearance. BILATERAL nephrostomy tubes without hydronephrosis. Cortical atrophy of LEFT kidney. No definite renal mass. Foley catheter decompresses urinary bladder. Marked bladder wall thickening evident. Stomach/Bowel: Transverse double barrel loop colostomy. Rectal wall thickening, could be related to proctitis either from infection or radiation. Stranding of perirectal fat and presacral fat planes. Stomach and remaining bowel loops unremarkable. Vascular/Lymphatic: Atherosclerotic calcifications aorta and iliac arteries. Coronary arterial calcifications. No definite adenopathy. Reproductive: Enlargement of prostate gland measuring 5.3 x 4.7 x 6.3 cm. Other: Small BILATERAL inguinal hernias LEFT larger than RIGHT containing fat. No free air or free fluid. Musculoskeletal: Prior L3-L4 posterior fusion. Degenerative disc disease changes at L2-L3 and L5-S1. Scattered Schmorl's nodes without definite metastatic bone lesions. IMPRESSION: Diffuse bladder wall thickening which suspect related to known tumor. Diffuse wall thickening of the rectum with perirectal and presacral infiltrative changes, question infectious proctitis versus prior radiation therapy. Prostatic enlargement. BILATERAL nephrostomy tubes without hydronephrosis. Cortical atrophy LEFT kidney. Mild gallbladder wall thickening question surrounding fluid ; recommend follow-up sonographic assessment. Minimal bibasilar effusions atelectasis as well as minimal infiltrate in RIGHT lower lobe. Question 8 mm diameter RIGHT lower lobe nodule, recommend attention on follow-up imaging. Small BILATERAL inguinal hernias larger on LEFT. Aortic Atherosclerosis (ICD10-I70.0). Electronically Signed   By: Lavonia Dana M.D.   On: 01/01/2018 14:56    Review of Systems  Constitutional: Positive for fever and malaise/fatigue.  HENT: Negative.    Eyes: Negative.   Respiratory: Negative.   Cardiovascular: Negative.   Gastrointestinal: Negative.   Genitourinary: Positive for flank pain.  Skin: Negative.   Neurological: Negative.   Endo/Heme/Allergies: Negative.   Psychiatric/Behavioral: Negative.    Blood pressure 108/72, pulse 80, temperature 98.3 F (36.8 C), temperature source Oral, resp. rate 18, height '5\' 8"'$  (1.727 m), weight 88 kg (194 lb), SpO2 96 %. Physical Exam  Constitutional: He appears well-developed.  Family at bedside.   HENT:  Head: Normocephalic.  Eyes: Pupils are equal, round, and reactive to light.  Neck: Normal range of motion.  Cardiovascular: Normal rate.  Respiratory: Effort normal.  GI: Soft.  Colostomy patent of stool and gas. Rt [place] port in place.   Genitourinary: Penis normal.  Genitourinary Comments: Foley removed. Bilat neph tubes in place with yellow urine.   Musculoskeletal: Normal range of motion.  Neurological: He is alert.  Skin: Skin is warm.  Psychiatric: He has a normal mood and affect.    Assessment/Plan:  1 - Metastatic Bladder Cancer - very poor prognosis, likely 6-12 month life expectance if responds to chemo, less if minimal response. Purely palliative transition may be warranted with cessation of further cancer directed therapy should his functional status continue to decline.   2 - Stage 3 Renal Insuficiency with Malignant Ureteral Obstruction - neph tubes in good position and GFR at bseline. No indication for procedural intervention this admission.   3 - Pelvic Pain / Sensation of Urinary Urgency - this is likely from his cancer as not improved with catheter. Aggressive pain management with narcotics certainly warranted given his burden of disease. Foley removed today to lessen tube burden / sites for infection.   4 - Fever, Malaise, Possible Pyelonephritis - possible early pyelo. His present cefepime appears appropriate coverage based on most recent CX data from our office  penidng further CX from  this admission.   I will make Dr. [name] aware of pt's admission.   Aizen Duval 01/02/2018, 8:46 AM

## 2018-01-02 NOTE — Progress Notes (Signed)
  Echocardiogram 2D Echocardiogram has been performed.  [name] F 01/02/2018, 2:35 PM

## 2018-01-02 NOTE — Progress Notes (Signed)
Pt complains of pressure type of feeling in his bladder. States "it feels like I've got to pee." Bladder scan performed and reveals 0 mls. On call provider, Bodenheimer paged and ordered ditropan for bladder spasms. Upon reassessment no change in status. On call made aware. No new orders at this time. Will continue to monitor closely.

## 2018-01-03 ENCOUNTER — Inpatient Hospital Stay (HOSPITAL_COMMUNITY): Payer: Medicare HMO

## 2018-01-03 ENCOUNTER — Encounter (HOSPITAL_COMMUNITY): Payer: Self-pay

## 2018-01-03 ENCOUNTER — Ambulatory Visit (HOSPITAL_COMMUNITY): Payer: Medicare HMO

## 2018-01-03 ENCOUNTER — Telehealth: Payer: Self-pay | Admitting: *Deleted

## 2018-01-03 ENCOUNTER — Encounter (HOSPITAL_COMMUNITY): Payer: Medicare HMO

## 2018-01-03 DIAGNOSIS — R0600 Dyspnea, unspecified: Secondary | ICD-10-CM

## 2018-01-03 DIAGNOSIS — D6959 Other secondary thrombocytopenia: Secondary | ICD-10-CM

## 2018-01-03 DIAGNOSIS — C785 Secondary malignant neoplasm of large intestine and rectum: Secondary | ICD-10-CM

## 2018-01-03 DIAGNOSIS — D6481 Anemia due to antineoplastic chemotherapy: Secondary | ICD-10-CM

## 2018-01-03 DIAGNOSIS — I214 Non-ST elevation (NSTEMI) myocardial infarction: Secondary | ICD-10-CM

## 2018-01-03 DIAGNOSIS — I255 Ischemic cardiomyopathy: Secondary | ICD-10-CM

## 2018-01-03 DIAGNOSIS — C7951 Secondary malignant neoplasm of bone: Secondary | ICD-10-CM

## 2018-01-03 DIAGNOSIS — C679 Malignant neoplasm of bladder, unspecified: Secondary | ICD-10-CM

## 2018-01-03 DIAGNOSIS — C786 Secondary malignant neoplasm of retroperitoneum and peritoneum: Secondary | ICD-10-CM

## 2018-01-03 DIAGNOSIS — Z936 Other artificial openings of urinary tract status: Secondary | ICD-10-CM

## 2018-01-03 DIAGNOSIS — N1330 Unspecified hydronephrosis: Secondary | ICD-10-CM

## 2018-01-03 LAB — CBC
HEMATOCRIT: 21.3 % — AB (ref 39.0–52.0)
Hemoglobin: 7.2 g/dL — ABNORMAL LOW (ref 13.0–17.0)
MCH: 30.5 pg (ref 26.0–34.0)
MCHC: 33.8 g/dL (ref 30.0–36.0)
MCV: 90.3 fL (ref 78.0–100.0)
Platelets: 38 10*3/uL — ABNORMAL LOW (ref 150–400)
RBC: 2.36 MIL/uL — ABNORMAL LOW (ref 4.22–5.81)
RDW: 16 % — ABNORMAL HIGH (ref 11.5–15.5)
WBC: 3.8 10*3/uL — ABNORMAL LOW (ref 4.0–10.5)

## 2018-01-03 LAB — BASIC METABOLIC PANEL
Anion gap: 5 (ref 5–15)
BUN: 35 mg/dL — AB (ref 6–20)
CHLORIDE: 110 mmol/L (ref 101–111)
CO2: 17 mmol/L — AB (ref 22–32)
CREATININE: 1.34 mg/dL — AB (ref 0.61–1.24)
Calcium: 8.1 mg/dL — ABNORMAL LOW (ref 8.9–10.3)
GFR calc Af Amer: 56 mL/min — ABNORMAL LOW (ref >60)
GFR calc non Af Amer: 48 mL/min — ABNORMAL LOW (ref >60)
GLUCOSE: 99 mg/dL (ref 65–99)
Potassium: 3.9 mmol/L (ref 3.5–5.1)
Sodium: 132 mmol/L — ABNORMAL LOW (ref 135–145)

## 2018-01-03 LAB — PREPARE RBC (CROSSMATCH)

## 2018-01-03 LAB — URINE CULTURE: Culture: >=100000 — AB

## 2018-01-03 MED ORDER — ADULT MULTIVITAMIN W/MINERALS CH
1.0000 | ORAL_TABLET | Freq: Every day | ORAL | Status: DC
  Administered 2018-01-03 – 2018-01-04 (×2): 1 via ORAL
  Filled 2018-01-03 (×2): qty 1

## 2018-01-03 MED ORDER — MORPHINE SULFATE (PF) 4 MG/ML IV SOLN
2.0000 mg | INTRAVENOUS | Status: DC | PRN
  Administered 2018-01-03 – 2018-01-04 (×4): 2 mg via INTRAVENOUS
  Filled 2018-01-03 (×4): qty 1

## 2018-01-03 MED ORDER — HYDRALAZINE HCL 25 MG PO TABS
25.0000 mg | ORAL_TABLET | Freq: Three times a day (TID) | ORAL | Status: DC
  Administered 2018-01-03 – 2018-01-04 (×3): 25 mg via ORAL
  Filled 2018-01-03 (×3): qty 1

## 2018-01-03 MED ORDER — SODIUM CHLORIDE 0.9% FLUSH
10.0000 mL | INTRAVENOUS | Status: DC | PRN
  Administered 2018-01-03 (×2): 10 mL
  Administered 2018-01-03: 20 mL
  Filled 2018-01-03 (×2): qty 40

## 2018-01-03 MED ORDER — DOXYCYCLINE HYCLATE 100 MG PO TABS
100.0000 mg | ORAL_TABLET | Freq: Two times a day (BID) | ORAL | Status: DC
  Administered 2018-01-03 – 2018-01-04 (×3): 100 mg via ORAL
  Filled 2018-01-03 (×3): qty 1

## 2018-01-03 MED ORDER — SODIUM CHLORIDE 0.9 % IV SOLN: Freq: Once | INTRAVENOUS | Status: DC

## 2018-01-03 MED ORDER — TECHNETIUM TC 99M DIETHYLENETRIAME-PENTAACETIC ACID
32.4000 | Freq: Once | INTRAVENOUS | Status: AC | PRN
  Administered 2018-01-03: 32.4 via INTRAVENOUS

## 2018-01-03 MED ORDER — PREMIER PROTEIN SHAKE
11.0000 [oz_av] | ORAL | Status: DC
  Filled 2018-01-03 (×2): qty 325.31

## 2018-01-03 NOTE — Care Management Note (Signed)
Case Management Note  Patient Details  Name: JUDAS MOHAMMAD MRN: [id number] Date of Birth: September 19, 1935  Subjective/Objective:   83 y/o m admitted w/Sepsis. Readmit-Active w/[facility]-H[facility]-ostomy-rep Santiago Glad aware.                 Action/Plan:d/c home w/HHC.   Expected Discharge Date:                  Expected Discharge Plan:  [place]  In-House Referral:     Discharge planning Services  CM Consult  Post Acute Care Choice:  Home Health([facility]-HHRN) Choice offered to:  Patient  DME Arranged:    DME Agency:     HH Arranged:  RN [place] Agency:  [place]  Status of Service:  In process, will continue to follow  If discussed at Long Length of Stay Meetings, dates discussed:    Additional Comments:  Dessa Phi, RN 01/03/2018, 3:53 PM

## 2018-01-03 NOTE — Progress Notes (Signed)
Initial Nutrition Assessment  DOCUMENTATION CODES:   Not applicable  INTERVENTION:  - Will d/c Ensure Enlive per pt preference. - Will trial Premier Protein once/day, this supplement provides 160 kcal and 30 grams of protein. - Will order daily multivitamin with minerals.  - Continue to encourage PO intakes.   NUTRITION DIAGNOSIS:   Increased nutrient needs related to catabolic illness, cancer and cancer related treatments as evidenced by estimated needs.  GOAL:   Patient will meet greater than or equal to 90% of their needs  MONITOR:   PO intake, Supplement acceptance, Weight trends, Labs  REASON FOR ASSESSMENT:   Malnutrition Screening Tool  ASSESSMENT:   83 y.o. male with PMH of invasive/metastatic urothelial carcinoma who is s/p cystoscopy and L retrograde pyelogram with TURBT on 11/25/17. He was subsequently found to have bilateral hydronephrosis likely from malignant obstruction and underwent L PCN on 11/28/17. Pt is also s/p diverting colostomy on 12/01/17, and subsequently had R PCN tube on 12/03/17.  Pt received first cycle of palliative chemo--carboplatin and gemcitabine given 12/30/17.  One nephrostomy tube is leaking and was collecting in pt's bladder, Dr. Jeffie Coffey placed a foley catheter. Pt developed a fever of 102 at home on 12/31/17, admitted 01/01/18 with sepsis from presumed urinary source and elevated troponin in the setting of sepsis.  BMI indicates overweight status, borderline obesity. No intakes documented since admission; meals in [facility] documented meals from yesterday provided 1764 kcal and 54 grams of protein and breakfast this AM provided 408 kcal and 5 grams of protein. Pt's appetite and intakes had been fine up until 3-4 days ago following first chemo treatment and fever. Since that time, intakes and desire to eat/drink have been decreased. Ensure Enlive ordered BID at admission per [facility] protocol but pt does not like this supplement and has refused it since  admission; will trial Premier Protein to aid in meeting protein need and will change supplement orders as needed throughout hospitalization.   Pt's weight has been stable since at least late summer or early fall. Per chart review, weight has been stable since September. Per review of Care Everywhere, pt weighed 206 lbs on 12/17/2013. Per Dr. Zettie Coffey note yesterday morning, "very poor prognosis, likely 6-12 month life expectance if responds to chemo, less if minimal response. Purely palliative transition may be warranted with cessation of further cancer directed therapy should his functional status continue to decline."    Medications reviewed; 40 mg oral Protonix/day. Labs reviewed; Na: 132 mmol/L, BUN: 35 mg/dL, creatinine: 1.3 mg/dL, Ca: 8.1 mg/dL, GFR: 48 mL/min.       NUTRITION - FOCUSED PHYSICAL EXAM:    Most Recent Value  Orbital Region  Unable to assess  Upper Arm Region  Unable to assess  Thoracic and Lumbar Region  Unable to assess  Buccal Region  Unable to assess  Temple Region  Unable to assess  Clavicle Bone Region  Unable to assess  Clavicle and Acromion Bone Region  Unable to assess  Scapular Bone Region  Unable to assess  Dorsal Hand  Unable to assess  Patellar Region  Unable to assess  Anterior Thigh Region  Unable to assess  Posterior Calf Region  Unable to assess  Edema (RD Assessment)  Unable to assess  Hair  Unable to assess  Eyes  Unable to assess  Mouth  Unable to assess  Skin  Unable to assess  Nails  Unable to assess       Diet Order:  Diet Heart Room  service appropriate? Yes; Fluid consistency: Thin  EDUCATION NEEDS:   No education needs have been identified at this time  Skin:  Skin Assessment: Reviewed RN Assessment  Last BM:  1/6  Height:   Ht Readings from Last 1 Encounters:  01/01/18 5\' 8"  (1.727 m)    Weight:   Wt Readings from Last 1 Encounters:  01/01/18 194 lb (88 kg)    Ideal Body Weight:  70 kg  BMI:  Body mass index is  29.5 kg/m.  Estimated Nutritional Needs:   Kcal:  2200-2375 (25-27 kcal/kg)  Protein:  115-130 grams  Fluid:  >/= 2.2 L/day      Charles Matin, MS, RD, LDN, [facility] Inpatient Clinical Dietitian Pager # [phone] After hours/weekend pager # [phone]

## 2018-01-03 NOTE — Telephone Encounter (Signed)
Received voice mail message from wife stating,"Charles Coffey is suppose to have lab/flush/MD/chemotherapy this Thursday, 1/10. Dr. [name] told us he doesn't have to have chemotherapy, but we would like to keep the appointment with him. Our son is flying in from [place] and would like to come to that appointment. My return number is [phone]."

## 2018-01-03 NOTE — Progress Notes (Signed)
Attempted to complete venous duplex at 3:00 pm. Patient in Radiology and Nuclear Med. Will do first thing 01/04/2018. [facility], [place]

## 2018-01-03 NOTE — Progress Notes (Addendum)
Patient Demographics:    Charles Coffey, is a 83 y.o. male, DOB - 05/06/1935, AYG:[id number]  Admit date - 01/01/2018   Admitting Physician Jonathandavid Marlett Denton Brick, MD  Outpatient Primary MD for the patient is [name], MD  LOS - 2   Chief Complaint  Patient presents with  . Fever        Subjective:    Charles Coffey today has no fevers, no emesis,  No chest pain, wife at bedside,   no diarrhea, no dysuria,   Assessment  & Plan :    Principal Problem:   Sepsis secondary to UTI [facility]) Active Problems:   Essential hypertension   Anemia   Elevated troponin   Urothelial carcinoma of bladder (HCC)   Thrombocytopenia (HCC)   Acute lower UTI  Brief summary :-  Charles Coffey  is a 83 y.o. male  With pmhx relevant for invasive urothelial carcinoma who is s/p cystoscopy and left retrograde pyelogram with TURBT on 11/25/17, subsequently found to have  bilateral hydronephrosis likely from malignant obstruction and pyelo and underwent left PCN on 11/28/17 , pt is also s/p Diverting Colostomy which was performed on 12/01/17, subsequently had  Right PCN tube on 12/03/17. Pt  Received palliative chemo- as per  Dr. [name] 1st dose of carboplatin and gemcitabine given 12/30/17. 1 of the nephrostomy tubes is leaking and was collecting in pt's bladder, Dr. [name] (urology)placed a foley catheter. Urine was  infected, so he was put on cipro. Pt developed a fever of 102 at home on 12/31/17, admitted 01/01/18 with sepsis from presumed urinary source and elevated troponin in the setting of sepsis.   Plan:-  1)Sepsis secondary to MRSA and Citrobacter UTI-clinically improving, discussed with pharmacist, doxycycline for MRSA, patient with complicated UTI history of prior invasive urothelial carcinoma with hydronephrosis, Foley removed 01/02/18 by Dr Tresa Moore, pt has bilateral nephrostomy tubes in situ-   IV cefepime (started 01/01/18),  recent urine culture from urology clinic grew Citrobacter sensitive to Rocephin and Cipro, prelim urine cx from 01/01/18 with MRSA, white count is better, patient is clinically looking better, afebrile,  Per Pharmacist consider d/c home on Vantin 200 mg bid and Bactrim DS 1 po bid for  10 days (??? Renal concerns with bactrim) Vs doxycycline    2)Elevated Troponin- No chest pain at this time, echocardiogram from 01/02/18 with EF in 25-30% range (down from 50 %) with diffuse hypokinesis and findings of both systolic and diastolic dysfunction EKG without acute ST elevation, ED provider discussed this with on-call cardiologist Dr. Pierre Bali recommended baby aspirin and conservative treatment without full anticoagulation given thrombocytopenia. Please see full cardiology consult note dated 01/03/2017.   Please note that the pt had LHC on 08/25/17 with Significant underlying three-vessel coronary artery disease with patent LIMA to LAD and SVG to OM 2. RIMA to RCA is atretic. The right coronary artery is occluded distally with extensive collaterals from the LAD,  C/n Coreg,imdur,   aspirin 81 mg, pt is intolerant to statin  3)Obstructive Uropathy/metastatic bladder cancer since 10/2017-  With pmhx relevant for invasive urothelial carcinoma who is s/p cystoscopy and left retrograde pyelogram with TURBT on 11/25/17, subsequently found to have  bilateral hydronephrosis likely from malignant obstruction and pyelo and underwent  left PCN on 11/28/17 , pt is also s/p Diverting Colostomy which was performed on 12/01/17, subsequently had  Right PCN tube on 12/03/17. Pt  Received palliative chemo- as per  Dr. [name] 1st dose of carboplatin and gemcitabine given 12/30/17. 1 of the nephrostomy tubes is leaking and was collecting in pt's bladder, Dr. [name] (urology) , please see urology consult from Dr. Tammi Klippel dated 01/02/2018  4)CKD III-creatinine is down to 1.3, baseline is 1.6   avoid nephrotoxic agents maintain adequate  hydration  5)Anemia/Thrombocytopenia-transfused 2 units of packed cells, no need for platelet transfusion as per patient's oncologist  Code Status : Full   Disposition Plan  : Home   Consults  :  Urology,   Pharmacist/Cardiology   DVT Prophylaxis  :   SCDs    Lab Results  Component Value Date   PLT 38 (L) 01/03/2018    Inpatient Medications  Scheduled Meds: . aspirin EC  81 mg Oral Daily  . carvedilol  6.25 mg Oral BID WC  . dicyclomine  10 mg Oral TID AC & HS  . doxycycline  100 mg Oral Q12H  . isosorbide mononitrate  60 mg Oral Daily  . linaclotide  290 mcg Oral QAC breakfast  . lubiprostone  24 mcg Oral BID WC  . multivitamin with minerals  1 tablet Oral Daily  . pantoprazole  40 mg Oral Daily  . pramipexole  1.5 mg Oral BID  . protein supplement shake  11 oz Oral Q24H   Continuous Infusions: . sodium chloride 10 mL/hr at 01/03/18 1136  . sodium chloride 10 mL/hr at 01/03/18 1602  . sodium chloride     PRN Meds:.acetaminophen, diphenhydrAMINE, hydrocortisone, ipratropium-albuterol, morphine injection, naphazoline-pheniramine, nitroGLYCERIN, oxyCODONE, prochlorperazine, sodium chloride flush, temazepam, traMADol    Anti-infectives (From admission, onward)   Start     Dose/Rate Route Frequency Ordered Stop   01/03/18 1000  doxycycline (VIBRA-TABS) tablet 100 mg     100 mg Oral Every 12 hours 01/03/18 0953     01/02/18 1200  ceFEPIme (MAXIPIME) 2 g in dextrose 5 % 50 mL IVPB     2 g 100 mL/hr over 30 Minutes Intravenous Every 24 hours 01/01/18 1659 01/03/18 1308   01/01/18 1330  ceFEPIme (MAXIPIME) 2 g in dextrose 5 % 50 mL IVPB  Status:  Discontinued     2 g 100 mL/hr over 30 Minutes Intravenous  Once 01/01/18 1324 01/01/18 2156   01/01/18 1300  ceFEPIme (MAXIPIME) 2 g in dextrose 5 % 50 mL IVPB     2 g 100 mL/hr over 30 Minutes Intravenous  Once 01/01/18 1254 01/01/18 1451        Objective:   Vitals:   01/03/18 1613 01/03/18 1817 01/03/18 1945 01/03/18  2015  BP: 104/75 106/76 104/67 (!) 112/92  Pulse: 69 66 67 65  Resp: _0 Temp: 97.6 F (36.4 C) 97.9 F (36.6 C) 98 F (36.7 C) 98.4 F (36.9 C)  TempSrc: Oral Oral Oral Oral  SpO2: 99% 99% 95% 99%  Weight:      Height:        Wt Readings from Last 3 Encounters:  01/01/18 88 kg (194 lb)  12/27/17 85.3 kg (188 lb)  12/15/17 85.6 kg (188 lb 11.2 oz)     Intake/Output Summary (Last 24 hours) at 01/03/2018 2037 Last data filed at 01/03/2018 1900 Gross per 24 hour  Intake 2415 ml  Output 1400 ml  Net 1015 ml  Physical Exam  Gen:- Awake Alert,  In no apparent distress  HEENT:- [place].AT, No sclera icterus, HOH Neck-Supple Neck,No JVD,.  Lungs-somewhat diminished in bases, no significant adventitious sounds CV- S1, S2 normal, CABG scar Abd-  +ve B.Sounds, Abd Soft, No tenderness, bilateral nephrostomy tubes, colostomy bag noted Extremity/Skin:- No  edema,    Psych-appropriate affect Neuro-no new focal deficits no tremors   Data Review:   Micro Results Recent Results (from the past 240 hour(s))  Blood culture (routine x 2)     Status: None (Preliminary result)   Collection Time: 01/01/18  1:04 PM  Result Value Ref Range Status   Specimen Description BLOOD RIGHT PORTA CATH  Final   Special Requests   Final    BOTTLES DRAWN AEROBIC AND ANAEROBIC Blood Culture adequate volume   Culture   Final    NO GROWTH 2 DAYS Performed at [facility] [address] [place], [place] [address]    Report Status PENDING  Incomplete  Blood culture (routine x 2)     Status: None (Preliminary result)   Collection Time: 01/01/18  1:04 PM  Result Value Ref Range Status   Specimen Description BLOOD RIGHT ANTECUBITAL  Final   Special Requests   Final    BOTTLES DRAWN AEROBIC AND ANAEROBIC Blood Culture adequate volume   Culture   Final    NO GROWTH 2 DAYS Performed at [facility], [place] [address] [place], [place] [address]    Report Status PENDING  Incomplete    Urine culture     Status: None   Collection Time: 01/01/18  1:04 PM  Result Value Ref Range Status   Specimen Description URINE, RANDOM  Final   Special Requests BLADDER  Final   Culture   Final    NO GROWTH Performed at [facility], [address], [place], [place] [address]    Report Status 01/02/2018 FINAL  Final  Urine culture     Status: Abnormal   Collection Time: 01/01/18  1:04 PM  Result Value Ref Range Status   Specimen Description URINE, RANDOM  Final   Special Requests BLADDER  Final   Culture (A)  Final    >=100,000 COLONIES/mL METHICILLIN RESISTANT STAPHYLOCOCCUS AUREUS   Report Status 01/03/2018 FINAL  Final   Organism ID, Bacteria METHICILLIN RESISTANT STAPHYLOCOCCUS AUREUS (A)  Final      Susceptibility   Methicillin resistant staphylococcus aureus - MIC*    CIPROFLOXACIN >=8 RESISTANT Resistant     GENTAMICIN <=0.5 SENSITIVE Sensitive     NITROFURANTOIN <=16 SENSITIVE Sensitive     OXACILLIN >=4 RESISTANT Resistant     TETRACYCLINE <=1 SENSITIVE Sensitive     VANCOMYCIN <=0.5 SENSITIVE Sensitive     TRIMETH/SULFA <=10 SENSITIVE Sensitive     CLINDAMYCIN <=0.25 SENSITIVE Sensitive     RIFAMPIN <=0.5 SENSITIVE Sensitive     Inducible Clindamycin NEGATIVE Sensitive     * >=100,000 COLONIES/mL METHICILLIN RESISTANT STAPHYLOCOCCUS AUREUS  Urine culture     Status: None   Collection Time: 01/01/18  3:09 PM  Result Value Ref Range Status   Specimen Description URINE, RANDOM  Final   Special Requests LEFT NEPHROSTOMY  Final   Culture   Final    NO GROWTH Performed at [facility], [place] [address] [place], [place] [address]    Report Status 01/02/2018 FINAL  Final    Radiology Reports Dg Chest 2 View  Result Date: 01/03/2018 CLINICAL DATA:  Dyspnea.  History of hypertension EXAM: CHEST  2 VIEW COMPARISON:  PA and lateral chest x-ray of January 01, 2018 FINDINGS: The lungs are adequately inflated. The interstitial markings are increased diffusely.  The pulmonary vascularity is engorged. The cardiac silhouette is enlarged. There are small bilateral pleural effusions layering posteriorly. The sternal wires are intact. The porta catheter tip projects over the midportion of the SVC. The observed bony thorax exhibits no acute abnormality. IMPRESSION: CHF with interstitial edema and small bilateral pleural effusions new since the previous study. Underlying chronic bronchitic changes. Electronically Signed   By: David  Martinique M.D.   On: 01/03/2018 15:29   Dg Chest 2 View  Result Date: 01/01/2018 CLINICAL DATA:  Fever.  History of bladder cancer. EXAM: CHEST  2 VIEW COMPARISON:  11/29/2017; 11/28/2017 ; CT abdomen pelvis - earlier same day FINDINGS: Grossly unchanged enlarged cardiac silhouette and mediastinal contours given reduced lung volumes. Atherosclerotic plaque within thoracic aorta. Post median sternotomy. Stable position of support apparatus. Bilateral infrahilar opacities are unchanged and favored to represent atelectasis. Unchanged diffuse slightly nodular thickening of the pulmonary interstitium. No focal airspace opacities. No pleural effusion or pneumothorax. No evidence of edema. No acute osseus abnormalities. IMPRESSION: Chronic bronchitic change without superimposed acute cardiopulmonary disease. Specifically, no evidence of pneumonia. Electronically Signed   By: Sandi Mariscal M.D.   On: 01/01/2018 14:55   Ct Abdomen Pelvis W Contrast  Result Date: 01/01/2018 CLINICAL DATA:  Bladder cancer, received first dose of chemotherapy 2 days ago, abdominal pain, fever, shortness of breath and weakness. History of nephrostomy, colostomy, coronary artery disease post MI, hypertension, ischemic cardiomyopathy, former smoker EXAM: CT ABDOMEN AND PELVIS WITH CONTRAST TECHNIQUE: Multidetector CT imaging of the abdomen and pelvis was performed using the standard protocol following bolus administration of intravenous contrast. Sagittal and coronal MPR images  reconstructed from axial data set. CONTRAST:  24m ISOVUE-300 IOPAMIDOL (ISOVUE-300) INJECTION 61% IV. No oral contrast. COMPARISON:  12/18/2017 FINDINGS: Lower chest: Peribronchial thickening and interseptal thickening in the lower lobes greater on RIGHT. Tiny BILATERAL pleural effusions greater on RIGHT. Minimal infiltrate in RIGHT lower lobe. Questionable infiltrate versus small nodular focus 8 mm diameter image 2 in RIGHT lower lobe. Hepatobiliary: Mild gallbladder wall thickening with minimal adjacent fluid. Minimal periportal edema. Liver unremarkable. Pancreas: Normal appearance Spleen: Normal appearance Adrenals/Urinary Tract: Adrenal glands normal appearance. BILATERAL nephrostomy tubes without hydronephrosis. Cortical atrophy of LEFT kidney. No definite renal mass. Foley catheter decompresses urinary bladder. Marked bladder wall thickening evident. Stomach/Bowel: Transverse double barrel loop colostomy. Rectal wall thickening, could be related to proctitis either from infection or radiation. Stranding of perirectal fat and presacral fat planes. Stomach and remaining bowel loops unremarkable. Vascular/Lymphatic: Atherosclerotic calcifications aorta and iliac arteries. Coronary arterial calcifications. No definite adenopathy. Reproductive: Enlargement of prostate gland measuring 5.3 x 4.7 x 6.3 cm. Other: Small BILATERAL inguinal hernias LEFT larger than RIGHT containing fat. No free air or free fluid. Musculoskeletal: Prior L3-L4 posterior fusion. Degenerative disc disease changes at L2-L3 and L5-S1. Scattered Schmorl's nodes without definite metastatic bone lesions. IMPRESSION: Diffuse bladder wall thickening which suspect related to known tumor. Diffuse wall thickening of the rectum with perirectal and presacral infiltrative changes, question infectious proctitis versus prior radiation therapy. Prostatic enlargement. BILATERAL nephrostomy tubes without hydronephrosis. Cortical atrophy LEFT kidney. Mild  gallbladder wall thickening question surrounding fluid ; recommend follow-up sonographic assessment. Minimal bibasilar effusions atelectasis as well as minimal infiltrate in RIGHT lower lobe. Question 8 mm diameter RIGHT lower lobe nodule, recommend attention on  follow-up imaging. Small BILATERAL inguinal hernias larger on LEFT. Aortic Atherosclerosis (ICD10-I70.0). Electronically Signed   By: Lavonia Dana M.D.   On: 01/01/2018 14:56   Nm Pulmonary Perf And Vent  Result Date: 01/03/2018 CLINICAL DATA:  Three-week history of shortness of breath. EXAM: NUCLEAR MEDICINE VENTILATION - PERFUSION LUNG SCAN TECHNIQUE: Ventilation images were obtained in multiple projections using inhaled aerosol Tc-3mDTPA. Perfusion images were obtained in multiple projections after intravenous injection of Tc-945mAA. RADIOPHARMACEUTICALS:  32.4 mCi Technetium-9933mPA aerosol inhalation and 4.4 mCi Technetium-71m27m IV COMPARISON:  Chest x-ray 01/03/2018 FINDINGS: Ventilation: Patchy ventilation defects Perfusion: Scattered perfusion defects matching the ventilation defects without significant abnormality on the chest x-ray. IMPRESSION: Low probability ventilation perfusion lung scan for pulmonary embolism. Electronically Signed   By: P.  Marijo Sanes.   On: 01/03/2018 15:27   Ir Fluoro Guide Cv Line Right  Result Date: 12/27/2017 INDICATION: History of bladder cancer. In need of durable intravenous access for chemotherapy administration. EXAM: IMPLANTED PORT A CATH PLACEMENT WITH ULTRASOUND AND FLUOROSCOPIC GUIDANCE COMPARISON:  None. MEDICATIONS: Ancef 2 gm IV; The antibiotic was administered within an appropriate time interval prior to skin puncture. ANESTHESIA/SEDATION: Moderate (conscious) sedation was employed during this procedure. A total of Versed 2 mg and Fentanyl 100 mcg was administered intravenously. Moderate Sedation Time: 27 minutes. The patient's level of consciousness and vital signs were monitored  continuously by radiology nursing throughout the procedure under my direct supervision. CONTRAST:  None FLUOROSCOPY TIME:  54 seconds (23 mGy) COMPLICATIONS: None immediate. PROCEDURE: The procedure, risks, benefits, and alternatives were explained to the patient. Questions regarding the procedure were encouraged and answered. The patient understands and consents to the procedure. The right neck and chest were prepped with chlorhexidine in a sterile fashion, and a sterile drape was applied covering the operative field. Maximum barrier sterile technique with sterile gowns and gloves were used for the procedure. A timeout was performed prior to the initiation of the procedure. Local anesthesia was provided with 1% lidocaine with epinephrine. After creating a small venotomy incision, a micropuncture kit was utilized to access the internal jugular vein. Real-time ultrasound guidance was utilized for vascular access including the acquisition of a permanent ultrasound image documenting patency of the accessed vessel. The microwire was utilized to measure appropriate catheter length. A subcutaneous port pocket was then created along the upper chest wall utilizing a combination of sharp and blunt dissection. The pocket was irrigated with sterile saline. A single lumen thin power injectable port was chosen for placement. The 8 Fr catheter was tunneled from the port pocket site to the venotomy incision. The port was placed in the pocket. The external catheter was trimmed to appropriate length. At the venotomy, an 8 Fr peel-away sheath was placed over a guidewire under fluoroscopic guidance. The catheter was then placed through the sheath and the sheath was removed. Final catheter positioning was confirmed and documented with a fluoroscopic spot radiograph. The port was accessed with a Huber needle, aspirated and flushed with heparinized saline. The venotomy site was closed with an interrupted 4-0 Vicryl suture. The port  pocket incision was closed with interrupted 2-0 Vicryl suture and the skin was opposed with a running subcuticular 4-0 Vicryl suture. Dermabond and Steri-strips were applied to both incisions. Dressings were placed. The patient tolerated the procedure well without immediate post procedural complication. FINDINGS: After catheter placement, the tip lies within the superior cavoatrial junction. The catheter aspirates and flushes normally and is ready for immediate use.  IMPRESSION: Successful placement of a right internal jugular approach power injectable Port-A-Cath. The catheter is ready for immediate use. Electronically Signed   By: Sandi Mariscal M.D.   On: 12/27/2017 17:07   Ir US Guide Vasc Access Right  Result Date: 12/27/2017 INDICATION: History of bladder cancer. In need of durable intravenous access for chemotherapy administration. EXAM: IMPLANTED PORT A CATH PLACEMENT WITH ULTRASOUND AND FLUOROSCOPIC GUIDANCE COMPARISON:  None. MEDICATIONS: Ancef 2 gm IV; The antibiotic was administered within an appropriate time interval prior to skin puncture. ANESTHESIA/SEDATION: Moderate (conscious) sedation was employed during this procedure. A total of Versed 2 mg and Fentanyl 100 mcg was administered intravenously. Moderate Sedation Time: 27 minutes. The patient's level of consciousness and vital signs were monitored continuously by radiology nursing throughout the procedure under my direct supervision. CONTRAST:  None FLUOROSCOPY TIME:  54 seconds (23 mGy) COMPLICATIONS: None immediate. PROCEDURE: The procedure, risks, benefits, and alternatives were explained to the patient. Questions regarding the procedure were encouraged and answered. The patient understands and consents to the procedure. The right neck and chest were prepped with chlorhexidine in a sterile fashion, and a sterile drape was applied covering the operative field. Maximum barrier sterile technique with sterile gowns and gloves were used for the  procedure. A timeout was performed prior to the initiation of the procedure. Local anesthesia was provided with 1% lidocaine with epinephrine. After creating a small venotomy incision, a micropuncture kit was utilized to access the internal jugular vein. Real-time ultrasound guidance was utilized for vascular access including the acquisition of a permanent ultrasound image documenting patency of the accessed vessel. The microwire was utilized to measure appropriate catheter length. A subcutaneous port pocket was then created along the upper chest wall utilizing a combination of sharp and blunt dissection. The pocket was irrigated with sterile saline. A single lumen thin power injectable port was chosen for placement. The 8 Fr catheter was tunneled from the port pocket site to the venotomy incision. The port was placed in the pocket. The external catheter was trimmed to appropriate length. At the venotomy, an 8 Fr peel-away sheath was placed over a guidewire under fluoroscopic guidance. The catheter was then placed through the sheath and the sheath was removed. Final catheter positioning was confirmed and documented with a fluoroscopic spot radiograph. The port was accessed with a Huber needle, aspirated and flushed with heparinized saline. The venotomy site was closed with an interrupted 4-0 Vicryl suture. The port pocket incision was closed with interrupted 2-0 Vicryl suture and the skin was opposed with a running subcuticular 4-0 Vicryl suture. Dermabond and Steri-strips were applied to both incisions. Dressings were placed. The patient tolerated the procedure well without immediate post procedural complication. FINDINGS: After catheter placement, the tip lies within the superior cavoatrial junction. The catheter aspirates and flushes normally and is ready for immediate use. IMPRESSION: Successful placement of a right internal jugular approach power injectable Port-A-Cath. The catheter is ready for immediate use.  Electronically Signed   By: Sandi Mariscal M.D.   On: 12/27/2017 17:07   Ir Nephrostogram Left Thru Existing Access  Result Date: 12/24/2017 INDICATION: Right flank pain.  Right nephrostomy in place. EXAM: RIGHT NEPHROSTOMY EXCHANGE AND RIGHT NEPHROSTOGRAM COMPARISON:  None. MEDICATIONS: None. ANESTHESIA/SEDATION: Fentanyl  mcg IV; Versed  mg IV Moderate Sedation Time: The patient was continuously monitored during the procedure by the interventional radiology nurse under my direct supervision. CONTRAST:  - administered into the collecting system(s) FLUOROSCOPY TIME:  Fluoroscopy Time:  minutes  30 seconds ( mGy). COMPLICATIONS: None immediate. PROCEDURE: Informed written consent was obtained from the patient after a thorough discussion of the procedural risks, benefits and alternatives. All questions were addressed. Maximal Sterile Barrier Technique was utilized including caps, mask, sterile gowns, sterile gloves, sterile drape, hand hygiene and skin antiseptic. A timeout was performed prior to the initiation of the procedure. Contrast was injected into the existing right nephrostomy. It was then cut and exchanged over a Bentson wire for a new 10 French nephrostomy catheter. The new catheter is now coiled in the renal pelvis. It was looped and string fixed then sewn to the skin. Contrast was injected. FINDINGS: Nephrostogram through the existing right nephrostomy demonstrates that the catheter tip is coiled in a calyx. The new catheter is appropriately positioned and coiled in the renal pelvis. IMPRESSION: Successful right nephrostomy catheter exchange. Electronically Signed   By: Marybelle Killings M.D.   On: 12/24/2017 13:51   Ir Nephrostomy Exchange Right  Result Date: 12/24/2017 INDICATION: Right flank pain.  Right nephrostomy in place. EXAM: RIGHT NEPHROSTOMY EXCHANGE AND RIGHT NEPHROSTOGRAM COMPARISON:  None. MEDICATIONS: None. ANESTHESIA/SEDATION: Fentanyl  mcg IV; Versed  mg IV Moderate Sedation Time: The  patient was continuously monitored during the procedure by the interventional radiology nurse under my direct supervision. CONTRAST:  - administered into the collecting system(s) FLUOROSCOPY TIME:  Fluoroscopy Time:  minutes 30 seconds ( mGy). COMPLICATIONS: None immediate. PROCEDURE: Informed written consent was obtained from the patient after a thorough discussion of the procedural risks, benefits and alternatives. All questions were addressed. Maximal Sterile Barrier Technique was utilized including caps, mask, sterile gowns, sterile gloves, sterile drape, hand hygiene and skin antiseptic. A timeout was performed prior to the initiation of the procedure. Contrast was injected into the existing right nephrostomy. It was then cut and exchanged over a Bentson wire for a new 10 French nephrostomy catheter. The new catheter is now coiled in the renal pelvis. It was looped and string fixed then sewn to the skin. Contrast was injected. FINDINGS: Nephrostogram through the existing right nephrostomy demonstrates that the catheter tip is coiled in a calyx. The new catheter is appropriately positioned and coiled in the renal pelvis. IMPRESSION: Successful right nephrostomy catheter exchange. Electronically Signed   By: Marybelle Killings M.D.   On: 12/24/2017 13:51     CBC Recent Labs  Lab 12/29/17 1014 01/01/18 1303 01/02/18 1055 01/03/18 0852  WBC 4.2 12.5* 6.2 3.8*  HGB 8.8* 7.5* 7.2* 7.2*  HCT 26.0* 21.9* 21.2* 21.3*  PLT 70* 56* 44* 38*  MCV 89.3 89.8 90.2 90.3  MCH 30.2 30.7 30.6 30.5  MCHC 33.8 34.2 34.0 33.8  RDW 15.5* 15.9* 16.2* 16.0*  LYMPHSABS 1.4 1.3  --   --   MONOABS 0.6 0.1  --   --   EOSABS 0.2 0.0  --   --   BASOSABS 0.0 0.0  --   --     Chemistries  Recent Labs  Lab 12/29/17 1014 01/01/18 1303 01/02/18 1055 01/03/18 0852  NA 133* 129* 130* 132*  K 4.4 4.5 3.9 3.9  CL  --  104 109 110  CO2 20* 18* 16* 17*  GLUCOSE 102 97 146* 99  BUN 31.5* 50* 39* 35*  CREATININE 1.6* 1.66*  1.47* 1.34*  CALCIUM 9.6 8.8* 8.0* 8.1*  AST 15 90* 58*  --   ALT _0 --   ALKPHOS 61 47 45  --   BILITOT 0.43 1.3* 0.9  --    ------------------------------------------------------------------------------------------------------------------  No results for input(s): CHOL, HDL, LDLCALC, TRIG, CHOLHDL, LDLDIRECT in the last 72 hours.  No results found for: HGBA1C ------------------------------------------------------------------------------------------------------------------ No results for input(s): TSH, T4TOTAL, T3FREE, THYROIDAB in the last 72 hours.  Invalid input(s): FREET3 ------------------------------------------------------------------------------------------------------------------ No results for input(s): VITAMINB12, FOLATE, FERRITIN, TIBC, IRON, RETICCTPCT in the last 72 hours.  Coagulation profile No results for input(s): INR, PROTIME in the last 168 hours.  No results for input(s): DDIMER in the last 72 hours.  Cardiac Enzymes Recent Labs  Lab 01/01/18 1303 01/02/18 1055  TROPONINI 26.68* 15.92*   ------------------------------------------------------------------------------------------------------------------ No results found for: BNP   Roxan Hockey M.D on 01/03/2018 at 8:37 PM  Between 7am to 7pm - Pager - [phone]  After 7pm go to [email or web address] - password [email or web address]  [facility] -  Office  [phone]   Voice Recognition Viviann Spare dictation system was used to create this note, attempts have been made to correct errors. Please contact the author with questions and/or clarifications.

## 2018-01-03 NOTE — Progress Notes (Signed)
IP PROGRESS NOTE  Subjective:   Events of the last few days noted.  Charles Coffey was hospitalized on January 01, 2018 with a urosepsis and found to have a positive troponin.  He is currently receiving antibiotics and feels reasonably well.  He denies any chest pain or shortness of breath.  Objective:  Vital signs in last 24 hours: Temp:  [97.6 F (36.4 C)-97.8 F (36.6 C)] 97.6 F (36.4 C) (01/07 0453) Pulse Rate:  [71-82] 82 (01/07 0453) Resp:  [18] 18 (01/07 0453) BP: (101-126)/(68-70) 126/70 (01/07 0453) SpO2:  [96 %-99 %] 97 % (01/07 0453) Weight change:  Last BM Date: 01/02/18(per colostomy)  Intake/Output from previous day: 01/06 0701 - 01/07 0700 In: 5650 [I.V.:3600; IV Piggyback:50] Out: 20 [Urine:400; Stool:1] Alert, awake gentleman appeared without distress. Mouth: mucous membranes moist, pharynx normal without lesions Resp: clear to auscultation bilaterally Cardio: regular rate and rhythm, S1, S2 normal, no murmur, click, rub or gallop GI: soft, non-tender; bowel sounds normal; no masses,  no organomegaly Extremities: extremities normal, atraumatic, no cyanosis or edema   Lab Results: Recent Labs    01/01/18 1303 01/02/18 1055  WBC 12.5* 6.2  HGB 7.5* 7.2*  HCT 21.9* 21.2*  PLT 56* 44*    BMET Recent Labs    01/01/18 1303 01/02/18 1055  NA 129* 130*  K 4.5 3.9  CL 104 109  CO2 18* 16*  GLUCOSE 97 146*  BUN 50* 39*  CREATININE 1.66* 1.47*  CALCIUM 8.8* 8.0*    Studies/Results: Dg Chest 2 View  Result Date: 01/01/2018 CLINICAL DATA:  Fever.  History of bladder cancer. EXAM: CHEST  2 VIEW COMPARISON:  11/29/2017; 11/28/2017 ; CT abdomen pelvis - earlier same day FINDINGS: Grossly unchanged enlarged cardiac silhouette and mediastinal contours given reduced lung volumes. Atherosclerotic plaque within thoracic aorta. Post median sternotomy. Stable position of support apparatus. Bilateral infrahilar opacities are unchanged and favored to represent  atelectasis. Unchanged diffuse slightly nodular thickening of the pulmonary interstitium. No focal airspace opacities. No pleural effusion or pneumothorax. No evidence of edema. No acute osseus abnormalities. IMPRESSION: Chronic bronchitic change without superimposed acute cardiopulmonary disease. Specifically, no evidence of pneumonia. Electronically Signed   By: Sandi Mariscal M.D.   On: 01/01/2018 14:55   Ct Abdomen Pelvis W Contrast  Result Date: 01/01/2018 CLINICAL DATA:  Bladder cancer, received first dose of chemotherapy 2 days ago, abdominal pain, fever, shortness of breath and weakness. History of nephrostomy, colostomy, coronary artery disease post MI, hypertension, ischemic cardiomyopathy, former smoker EXAM: CT ABDOMEN AND PELVIS WITH CONTRAST TECHNIQUE: Multidetector CT imaging of the abdomen and pelvis was performed using the standard protocol following bolus administration of intravenous contrast. Sagittal and coronal MPR images reconstructed from axial data set. CONTRAST:  36mL ISOVUE-300 IOPAMIDOL (ISOVUE-300) INJECTION 61% IV. No oral contrast. COMPARISON:  12/18/2017 FINDINGS: Lower chest: Peribronchial thickening and interseptal thickening in the lower lobes greater on RIGHT. Tiny BILATERAL pleural effusions greater on RIGHT. Minimal infiltrate in RIGHT lower lobe. Questionable infiltrate versus small nodular focus 8 mm diameter image 2 in RIGHT lower lobe. Hepatobiliary: Mild gallbladder wall thickening with minimal adjacent fluid. Minimal periportal edema. Liver unremarkable. Pancreas: Normal appearance Spleen: Normal appearance Adrenals/Urinary Tract: Adrenal glands normal appearance. BILATERAL nephrostomy tubes without hydronephrosis. Cortical atrophy of LEFT kidney. No definite renal mass. Foley catheter decompresses urinary bladder. Marked bladder wall thickening evident. Stomach/Bowel: Transverse double barrel loop colostomy. Rectal wall thickening, could be related to proctitis either  from infection or radiation. Stranding of  perirectal fat and presacral fat planes. Stomach and remaining bowel loops unremarkable. Vascular/Lymphatic: Atherosclerotic calcifications aorta and iliac arteries. Coronary arterial calcifications. No definite adenopathy. Reproductive: Enlargement of prostate gland measuring 5.3 x 4.7 x 6.3 cm. Other: Small BILATERAL inguinal hernias LEFT larger than RIGHT containing fat. No free air or free fluid. Musculoskeletal: Prior L3-L4 posterior fusion. Degenerative disc disease changes at L2-L3 and L5-S1. Scattered Schmorl's nodes without definite metastatic bone lesions. IMPRESSION: Diffuse bladder wall thickening which suspect related to known tumor. Diffuse wall thickening of the rectum with perirectal and presacral infiltrative changes, question infectious proctitis versus prior radiation therapy. Prostatic enlargement. BILATERAL nephrostomy tubes without hydronephrosis. Cortical atrophy LEFT kidney. Mild gallbladder wall thickening question surrounding fluid ; recommend follow-up sonographic assessment. Minimal bibasilar effusions atelectasis as well as minimal infiltrate in RIGHT lower lobe. Question 8 mm diameter RIGHT lower lobe nodule, recommend attention on follow-up imaging. Small BILATERAL inguinal hernias larger on LEFT. Aortic Atherosclerosis (ICD10-I70.0). Electronically Signed   By: Lavonia Dana M.D.   On: 01/01/2018 14:56    Medications: I have reviewed the patient's current medications.  Assessment/Plan:  83 year old gentleman with the following issues  1.  Locally advanced urothelial carcinoma arising from the left genitourinary tract pathology review as well as the renal pelvis, ureter and bladder.  His disease has documented T4 involvement with rectal involvement as well as a retroperitoneal involvement that was biopsy-proven as well after a diverting colostomy.  No distant metastasis has been documented at this point.  He is currently receiving  chemotherapy with gemcitabine and carboplatin.  Chemotherapy will be on hold for the time being until his episode of sepsis and increased troponin is resolved.  2.  Bilateral hydronephrosis: Nephrostomy tubes are in place currently at baseline kidney function.  3.  Urosepsis: Currently on appropriate antibiotics.  4.  Thrombocytopenia and anemia: Related to systemic chemotherapy.  I recommend transfusion of 2 units of packed red cells given his cardiac history.  No need for platelet transfusion at this time.    LOS: 2 days   Charles Coffey 01/03/2018, 8:03 AM

## 2018-01-03 NOTE — Telephone Encounter (Signed)
Called Cecille Rubin and left a detailed VM with pt's name and DOB and the OK for the verbal orders that were requested.

## 2018-01-03 NOTE — Consult Note (Signed)
Cardiology Consultation:   Patient ID: Charles Coffey; [id number]; 1935/07/20   Admit date: 01/01/2018 Date of Consult: 01/03/2018  Primary Care Provider: [name], MD Primary Cardiologist: [name], MD    Patient Profile:   Charles Coffey is a 83 y.o. male with a PMH of CAD s/p MI and CABG (per 08/18 cath: patent LIMA to LAD and SVG to OM2; RIMA to RCA is atretic), ischemic cardiomyopathy, HTN, HLD, recent diagnosis of invasive urothelial carcinoma s/p TURBT 10/2017 c/b b/l hydronephrosis 2/2 malignant obstruction s/p PCN and diverting colostomy, on palliative chemotherapy, who is being seen today for the evaluation of elevated troponin in the setting of sepsis at the request of Dr. Denton Brick.  History of Present Illness:   Charles Coffey  is a 83 y.o. male with a PMH of CAD s/p MI and CABG (per 08/18 cath: patent LIMA to LAD and SVG to OM2; RIMA to RCA is occluded), ischemic cardiomyopathy, HTN, HLD, recent diagnosis of invasive urothelial carcinoma s/p TURBT 10/2017 c/b b/l hydronephrosis 2/2 malignant obstruction s/p PCN and diverting colostomy, on palliative chemotherapy, who presented to [facility] ED 01/01/18 with persistent fevers, despite being treated with Cipro for UTI outpatient. Patient states he was feeling fine until he received chemotherapy on Thursday (12/30/17), after which he noticed worsening SOB and DOE. His SOB prompted him to take a nitroglylcerin tab on Friday (12/31/17) which improved his symptoms. He has not experienced any chest pain or pressure, either prior to hospitalization or while admitted. He has had some intermittent LE edema for which he has taken po lasix in the past. Denies HA, orthopnea, PND.   Last seen outpatient by Cardiology 10/2017 with stable angina and cardiomyopathy. Due to his inability to afford ranolazine, patients imdur was increased to 60mg  daily. No other medication changes made.   Hospital course: afebrile, VSS. Labs on admission with  leukocytosis, Cr 1.66, Na 129, Trop 26.68. EKG with RBBB (old), non-ischemic. Started on IV abx and admitted to medicine for urosepsis.   Past Medical History:  Diagnosis Date  . Aortic atherosclerosis ([place])   . BPH with urinary obstruction   . CAD (coronary artery disease)    a.  MI 1995, CABG x 3 2002 (patient says that he had LIMA and RIMA grafts). b. ETT-Cardiolite (10/15) with EF 48%, apical scar, no ischemia. c. Nuc 07/2017 abnormal -> cath was performed,  patent LIMA to LAD and SVG to OM 2. RIMA to RCA is atretic. The right coronary artery is occluded distally with extensive collaterals from the LAD, medical therapy.   . Cancer [facility])    bladder cancer  . Cervical spondylosis without myelopathy 08-04-202017  . Chronic low back pain    sees Dr. Kary Kos   . GERD (gastroesophageal reflux disease)   . Hiatal hernia   . Hyperlipidemia   . Hypertension   . IBS (irritable bowel syndrome)   . Ischemic cardiomyopathy   . Myocardial infarction ([place])   . RBBB   . Restless legs syndrome   . Statin intolerance   . Syncope    a. in 2015 - no apparent cause, was taking sleep medicine at the time. Cardiac workup unremarkable.  . Tubular adenoma of colon     Past Surgical History:  Procedure Laterality Date  . COLONOSCOPY  10/17/2014   per Dr. Hilarie Fredrickson, tubular adenomas, repeat in 3 yrs   . COLONOSCOPY WITH PROPOFOL N/A 12/01/2017   Procedure: COLONOSCOPY WITH PROPOFOL;  Surgeon: Milus Banister,  MD;  Location: [facility] END[facility]COPY;  Service: Endoscopy;  Laterality: N/A;  . CYSTOSCOPY W/ RETROGRADES Left 11/25/2017   Procedure: CYSTOSCOPY WITH RETROGRADE PYELOGRAM;  Surgeon: Irine Seal, MD;  Location: [facility] ORS;  Service: Urology;  Laterality: Left;  . HEART BYPASS    . IR FLUORO GUIDE CV LINE RIGHT  12/27/2017  . IR NEPHROSTOGRAM LEFT THRU EXISTING ACCESS  12/24/2017  . IR NEPHROSTOMY EXCHANGE RIGHT  12/24/2017  . IR NEPHROSTOMY PLACEMENT LEFT  11/28/2017  . IR NEPHROSTOMY PLACEMENT RIGHT  12/03/2017    . IR US GUIDE VASC ACCESS RIGHT  12/27/2017  . LAPAROSCOPY N/A 12/01/2017   Procedure: LAPAROSCOPIC DIVERTING OSTOMY;  Surgeon: Stark Klein, MD;  Location: [facility] ORS;  Service: General;  Laterality: N/A;  . LEFT HEART CATH AND CORS/GRAFTS ANGIOGRAPHY N/A 08/25/2017   Procedure: LEFT HEART CATH AND CORS/GRAFTS ANGIOGRAPHY;  Surgeon: Wellington Hampshire, MD;  Location: [place] CV LAB;  Service: Cardiovascular;  Laterality: N/A;  . LUMBAR FUSION  2003   L3-L4  . PROSTATE SURGERY  06-27-12   per Dr. Roni Bread, had CTT  . TONSILLECTOMY    . TRANSURETHRAL RESECTION OF BLADDER TUMOR N/A 11/25/2017   Procedure: TRANSURETHRAL RESECTION OF BLADDER TUMOR (TURBT);  Surgeon: Irine Seal, MD;  Location: [facility];  Service: Urology;  Laterality: N/A;       Inpatient Medications: Scheduled Meds: . aspirin EC  81 mg Oral Daily  . carvedilol  6.25 mg Oral BID WC  . dicyclomine  10 mg Oral TID AC & HS  . doxycycline  100 mg Oral Q12H  . feeding supplement (ENSURE ENLIVE)  237 mL Oral BID BM  . isosorbide mononitrate  60 mg Oral Daily  . linaclotide  290 mcg Oral QAC breakfast  . lubiprostone  24 mcg Oral BID WC  . pantoprazole  40 mg Oral Daily  . pramipexole  1.5 mg Oral BID   Continuous Infusions: . sodium chloride 75 mL/hr at 01/03/18 0951  . sodium chloride    . sodium chloride    . ceFEPime (MAXIPIME) IV Stopped (01/02/18 1456)   PRN Meds: acetaminophen, diphenhydrAMINE, hydrocortisone, ipratropium-albuterol, morphine injection, naphazoline-pheniramine, nitroGLYCERIN, oxyCODONE, prochlorperazine, sodium chloride flush, temazepam, traMADol  Allergies:    Allergies  Allergen Reactions  . Antihistamines, Loratadine-Type Other (See Comments)    Unable to urinate  . Statins Other (See Comments)    liver effects    Social History:   Social History   Socioeconomic History  . Marital status: Married    Spouse name: Not on file  . Number of children: 5  . Years of education: Some coll  .  Highest education level: Not on file  Social Needs  . Financial resource strain: Not on file  . Food insecurity - worry: Not on file  . Food insecurity - inability: Not on file  . Transportation needs - medical: Not on file  . Transportation needs - non-medical: Not on file  Occupational History  . Occupation: retired  Tobacco Use  . Smoking status: Former Smoker    Types: Cigarettes    Last attempt to quit: 12/28/1978    Years since quitting: 39.0  . Smokeless tobacco: Never Used  Substance and Sexual Activity  . Alcohol use: Yes    Alcohol/week: 0.0 oz    Comment: occ-  . Drug use: No  . Sexual activity: Not on file  Other Topics Concern  . Not on file  Social History Narrative   Lives at home w/ his  wife   Right-handed   5 cups of coffee per day    Family History:    Family History  Problem Relation Age of Onset  . Heart disease Mother   . Heart attack Mother   . Aneurysm Father        femoral artery  . Heart disease Brother   . Heart disease Maternal Uncle        x 2  . Colon cancer Neg Hx   . Esophageal cancer Neg Hx   . Pancreatic cancer Neg Hx   . Kidney disease Neg Hx   . Liver disease Neg Hx      ROS:  Please see the history of present illness.   All other ROS reviewed and negative.     Physical Exam/Data:   Vitals:   01/02/18 1100 01/02/18 1444 01/02/18 2017 01/03/18 0453  BP: 110/69 105/68 101/69 126/70  Pulse: 71 71 75 82  Resp:   18 18  Temp:   97.8 F (36.6 C) 97.6 F (36.4 C)  TempSrc:  Oral Oral Oral  SpO2:  99% 96% 97%  Weight:      Height:        Intake/Output Summary (Last 24 hours) at 01/03/2018 1044 Last data filed at 01/03/2018 0848 Gross per 24 hour  Intake 4060 ml  Output 401 ml  Net 3659 ml   Filed Weights   01/01/18 1246 01/01/18 2159  Weight: 188 lb (85.3 kg) 194 lb (88 kg)   Body mass index is 29.5 kg/m.  General:  Well nourished, well developed, laying in bed in no acute distress HEENT: sclera anicteric  Neck: no  JVD Vascular: No carotid bruits; distal pulses 2+ bilaterally Cardiac:  normal S1, S2; RRR; no murmur, gallops, or rubs  Lungs:  clear to auscultation bilaterally, no wheezing, rhonchi or rales  Abd: NABS, soft, mild diffuse TTP, no hepatomegaly, ostomy present Ext: no edema Musculoskeletal:  No deformities, BUE and BLE strength normal and equal Skin: warm and dry  Neuro:  CNs 2-12 intact, no focal abnormalities noted Psych:  Normal affect   EKG:  The EKG was personally reviewed and demonstrates: rate 74, RBBB, non-ischemic Telemetry:  Telemetry was personally reviewed and demonstrates:  PVCs, trigeminy, and bigeminy patterns noted.   Relevant CV Studies:  Echo 01/02/2018: Study Conclusions  - Left ventricle: The cavity size was mildly dilated. Wall   thickness was normal. Systolic function was severely reduced. The   estimated ejection fraction was in the range of 25% to 30%.   Diffuse hypokinesis. Features are consistent with a pseudonormal   left ventricular filling pattern, with concomitant abnormal   relaxation and increased filling pressure (grade 2 diastolic   dysfunction). - Mitral valve: There was mild regurgitation. - Left atrium: The atrium was moderately to severely dilated. - Right ventricle: Systolic function was mildly to moderately   reduced. - Pulmonary arteries: PA peak pressure: 57 mm Hg (S).  Cardiac catheterization: 07/2017 Conclusion     LM lesion, 40 %stenosed.  Prox Cx to Mid Cx lesion, 100 %stenosed.  Mid LAD lesion, 100 %stenosed.  Ost LAD to Prox LAD lesion, 40 %stenosed.  Prox RCA to Mid RCA lesion, 30 %stenosed.  Dist RCA lesion, 100 %stenosed.  SVG.  The graft exhibits minimal luminal irregularities.  LIMA.  Prox Graft lesion, 100 %stenosed.  LIMA and is normal in caliber and anatomically normal.   1. Significant underlying three-vessel coronary artery disease with patent LIMA to LAD and  SVG to OM 2. RIMA to RCA is atretic.  The right coronary artery is occluded distally with extensive collaterals from the LAD.  2. Mildly elevated left ventricular end-diastolic pressure at 15 mmHg.  Recommendations: Continue medical therapy. 55 mL of contrast was used for the procedure. I'm going to observe the patient overnight with gentle hydration given underlying chronic kidney disease. Likely discharge home tomorrow.      Laboratory Data:  Chemistry Recent Labs  Lab 01/01/18 1303 01/02/18 1055 01/03/18 0852  NA 129* 130* 132*  K 4.5 3.9 3.9  CL 104 109 110  CO2 18* 16* 17*  GLUCOSE 97 146* 99  BUN 50* 39* 35*  CREATININE 1.66* 1.47* 1.34*  CALCIUM 8.8* 8.0* 8.1*  GFRNONAA 37* 43* 48*  GFRAA 43* 50* 56*  ANIONGAP 7 5 5     Recent Labs  Lab 12/29/17 1014 01/01/18 1303 01/02/18 1055  PROT 6.8 6.2* 5.9*  ALBUMIN 3.8 3.5 3.1*  AST 15 90* 58*  ALT 13 30 28   ALKPHOS 61 47 45  BILITOT 0.43 1.3* 0.9   Hematology Recent Labs  Lab 01/01/18 1303 01/02/18 1055 01/03/18 0852  WBC 12.5* 6.2 3.8*  RBC 2.44* 2.35* 2.36*  HGB 7.5* 7.2* 7.2*  HCT 21.9* 21.2* 21.3*  MCV 89.8 90.2 90.3  MCH 30.7 30.6 30.5  MCHC 34.2 34.0 33.8  RDW 15.9* 16.2* 16.0*  PLT 56* 44* 38*   Cardiac Enzymes Recent Labs  Lab 01/01/18 1303 01/02/18 1055  TROPONINI 26.68* 15.92*   No results for input(s): TROPIPOC in the last 168 hours.  BNPNo results for input(s): BNP, PROBNP in the last 168 hours.  DDimer No results for input(s): DDIMER in the last 168 hours.  Radiology/Studies:  Dg Chest 2 View  Result Date: 01/01/2018 CLINICAL DATA:  Fever.  History of bladder cancer. EXAM: CHEST  2 VIEW COMPARISON:  11/29/2017; 11/28/2017 ; CT abdomen pelvis - earlier same day FINDINGS: Grossly unchanged enlarged cardiac silhouette and mediastinal contours given reduced lung volumes. Atherosclerotic plaque within thoracic aorta. Post median sternotomy. Stable position of support apparatus. Bilateral infrahilar opacities are unchanged and  favored to represent atelectasis. Unchanged diffuse slightly nodular thickening of the pulmonary interstitium. No focal airspace opacities. No pleural effusion or pneumothorax. No evidence of edema. No acute osseus abnormalities. IMPRESSION: Chronic bronchitic change without superimposed acute cardiopulmonary disease. Specifically, no evidence of pneumonia. Electronically Signed   By: Sandi Mariscal M.D.   On: 01/01/2018 14:55   Ct Abdomen Pelvis W Contrast  Result Date: 01/01/2018 CLINICAL DATA:  Bladder cancer, received first dose of chemotherapy 2 days ago, abdominal pain, fever, shortness of breath and weakness. History of nephrostomy, colostomy, coronary artery disease post MI, hypertension, ischemic cardiomyopathy, former smoker EXAM: CT ABDOMEN AND PELVIS WITH CONTRAST TECHNIQUE: Multidetector CT imaging of the abdomen and pelvis was performed using the standard protocol following bolus administration of intravenous contrast. Sagittal and coronal MPR images reconstructed from axial data set. CONTRAST:  58mL ISOVUE-300 IOPAMIDOL (ISOVUE-300) INJECTION 61% IV. No oral contrast. COMPARISON:  12/18/2017 FINDINGS: Lower chest: Peribronchial thickening and interseptal thickening in the lower lobes greater on RIGHT. Tiny BILATERAL pleural effusions greater on RIGHT. Minimal infiltrate in RIGHT lower lobe. Questionable infiltrate versus small nodular focus 8 mm diameter image 2 in RIGHT lower lobe. Hepatobiliary: Mild gallbladder wall thickening with minimal adjacent fluid. Minimal periportal edema. Liver unremarkable. Pancreas: Normal appearance Spleen: Normal appearance Adrenals/Urinary Tract: Adrenal glands normal appearance. BILATERAL nephrostomy tubes without hydronephrosis. Cortical atrophy of LEFT kidney.  No definite renal mass. Foley catheter decompresses urinary bladder. Marked bladder wall thickening evident. Stomach/Bowel: Transverse double barrel loop colostomy. Rectal wall thickening, could be related to  proctitis either from infection or radiation. Stranding of perirectal fat and presacral fat planes. Stomach and remaining bowel loops unremarkable. Vascular/Lymphatic: Atherosclerotic calcifications aorta and iliac arteries. Coronary arterial calcifications. No definite adenopathy. Reproductive: Enlargement of prostate gland measuring 5.3 x 4.7 x 6.3 cm. Other: Small BILATERAL inguinal hernias LEFT larger than RIGHT containing fat. No free air or free fluid. Musculoskeletal: Prior L3-L4 posterior fusion. Degenerative disc disease changes at L2-L3 and L5-S1. Scattered Schmorl's nodes without definite metastatic bone lesions. IMPRESSION: Diffuse bladder wall thickening which suspect related to known tumor. Diffuse wall thickening of the rectum with perirectal and presacral infiltrative changes, question infectious proctitis versus prior radiation therapy. Prostatic enlargement. BILATERAL nephrostomy tubes without hydronephrosis. Cortical atrophy LEFT kidney. Mild gallbladder wall thickening question surrounding fluid ; recommend follow-up sonographic assessment. Minimal bibasilar effusions atelectasis as well as minimal infiltrate in RIGHT lower lobe. Question 8 mm diameter RIGHT lower lobe nodule, recommend attention on follow-up imaging. Small BILATERAL inguinal hernias larger on LEFT. Aortic Atherosclerosis (ICD10-I70.0). Electronically Signed   By: Lavonia Dana M.D.   On: 01/01/2018 14:56    Assessment and Plan:   1. Elevated Troponin in pt with CAD s/p CABG: patient without anginal complaints, however unlikely the degree of elevation could be solely explained by sepsis.  - Trop peak 26.62>>15.92 - Echo with newly decreased EF of 25-30% (previously 50% on Echo 08/2017) - Unfortunately, given disease processes and pancytopenia, patient is not a good candidate for further ischemic work-up. Would recommend medical management. - Continue ASA, Coreg, and Imdur. Patient is intolerant to statins  2. Urosepsis:    - MRSA UTI - Continue antibiotics per primary team  3. Ischemic Cardiomyopathy:  - EF newly reduced to 25-30% - Continue ASA, Coreg, and imdur  4. HTN: BP stable  - Continue coreg and imdur  5. Invasive Urothelial Carcinoma: - Continue management per Oncology/ Urology  6. HLD: - Intolerant to statins - Referred to Lipid clinic for Repatha    For questions or updates, please contact [place] Please consult [email or web address] for contact info under Cardiology/STEMI.   Signed, Abigail Butts, PA-C  01/03/2018 10:44 AM [phone]  As above, patient seen and examined.  Briefly he is an 83 year old male with past medical history of coronary artery disease status post coronary artery bypass and graft, hypertension, hyperlipidemia, recent diagnosis of invasive urothelial carcinoma for evaluation of myocardial infarction.  Patient had last cardiac catheterization August 2018 and medical therapy recommended.  Has recently been diagnosed with metastatic urothelial carcinoma involving the bladder, left ureter and rectum.  He is status post palliative diverting colostomy and is on palliative chemotherapy.  He was admitted on January 5 with complaints of fever and also noted to have fatigue and increasing dyspnea on exertion.  He has not had chest pain.  He has noticed increased lower extremity edema.  His troponin has been noted to be elevated at 26.  Cardiology asked to evaluate.  Physical exam significant for recent colostomy and bilateral nephrostomy tubes.  Laboratories show creatinine 1.34.  Troponin 26.68.  White blood cell count 3.8, hemoglobin 7.2 and platelet count 38.  Electrocardiogram shows sinus rhythm, first-degree AV block, right bundle branch block, inferior lateral infarct.  Echocardiogram shows ejection fraction 25-30%, left atrial enlargement, mild mitral regurgitation.  1 status post myocardial infarction-troponin is consistent with non-ST  elevation myocardial  infarction.  He has noted increased dyspnea but there has been no chest pain.  He is a very complicated patient with a recent diagnosis of invasive urothelial carcinoma undergoing palliative chemotherapy.  This has caused pancytopenia including platelet count of 38.  I would not consider him a candidate for cardiac catheterization given the aggressive nature of his cancer.  He also could not proceed with PCI if indicated as this would require dual antiplatelet therapy which would be contraindicated in the setting of his thrombocytopenia.  We will continue aspirin 81 mg daily for now.  Continue carvedilol and nitrates.  I will not anticoagulate with heparin given thrombocytopenia.  2 ischemic cardiomyopathy-we will continue with beta-blocker and nitrates.  No ACE inhibitor at this point given renal insufficiency.  We will consider adding hydralazine to nitrates later if blood pressure allows.  He does not appear to be volume overloaded at present but may need low-dose diuretic if dyspnea worsens.    3 Dyspnea-given multiple recent surgeries would consider pulmonary embolus.  Check lower extremity venous Dopplers and VQ scan.  4 invasive urothelial carcinoma-prognosis appears to be poor.  Further discussions and management per oncology.  5 urosepsis-antibiotics per primary care.  Kirk Ruths, MD

## 2018-01-03 NOTE — Care Management Note (Signed)
Case Management Note  Patient Details  Name: TYSHAUN VINZANT MRN: [id number] Date of Birth: [birth year]  Subjective/Objective: 83 y/o m admitted w/Sepsis-UTI. Readmit. From home w/spouse. No CM needs.                   Action/Plan:d/c home.   Expected Discharge Date:                  Expected Discharge Plan:  Home/Self Care  In-House Referral:     Discharge planning Services  CM Consult  Post Acute Care Choice:    Choice offered to:     DME Arranged:    DME Agency:     HH Arranged:    HH Agency:     Status of Service:  In process, will continue to follow  If discussed at Long Length of Stay Meetings, dates discussed:    Additional Comments:  Dessa Phi, RN 01/03/2018, 10:19 AM

## 2018-01-03 NOTE — Consult Note (Signed)
[place] Nurse ostomy follow up Stoma type/location: LLQ colostomy (loop).  Bridge placed in surgery is still in place; patient had to cancel appointment first surgical follow up appointment with Dr. Barry Dienes due to many other medical appointments.  Has rescheduled for 01/10/18 with her office. It is noted but not mentioned that the ostomy bridge is typically removed within 5-7 days of surgery. Patient's DOS was 12/01/17. Stomal assessment/size: 1 and 1/4 x 1 and 3/8 inch oval stoma with proximal limb at medial aspect. Peristomal assessment: Intact, minor erythema from 11-1 o'clock when plastic bridge end was resting upon skin. Treatment options for stomal/peristomal skin: Bridge removed without incident, skin barrier ring placed peristomally. 2-piece pouching system placed on top of bridge. Patient expresses no discomfort with bridge removal. Output: soft light brown seedy stool. Ostomy pouching: 2pc. 2 and 1/4 inch pouching system applied today.  Patient has many 2 and 3/4 inch supplies at home and is taught that he can certainly use those. HHRN should continue as while patient is beginning to master ostomy skills, she is not quite yet independent. If you agree, please order continuation of services upon discharge. Education provided: Patient and wife are taught why bridge was placed and how removal should be without pain.  Reinforced that stoma does not get infected from bowel movement that the bowel is used to feces and that a proper fit is what will keep the skin from local irritation. All agree how much easier pouching will now be without the bridge. Supplies ordered to room:  3 pouches, 3 rings and 3 skin barriers. Patient and spouse are instructed to ask MD or Nurse to contact me regarding ostomy issues. Enrolled patient in [place] Discharge program: Yes, previously.  [place] nursing team will not follow, but will remain available to this patient, the nursing and medical teams.  Please re-consult  if needed. Thanks, Maudie Flakes, MSN, RN, [place], Arther Abbott  Pager# [phone]

## 2018-01-04 ENCOUNTER — Encounter: Payer: Self-pay | Admitting: General Practice

## 2018-01-04 ENCOUNTER — Inpatient Hospital Stay (HOSPITAL_COMMUNITY): Payer: Medicare HMO

## 2018-01-04 ENCOUNTER — Telehealth: Payer: Self-pay

## 2018-01-04 DIAGNOSIS — I82449 Acute embolism and thrombosis of unspecified tibial vein: Secondary | ICD-10-CM

## 2018-01-04 DIAGNOSIS — R609 Edema, unspecified: Secondary | ICD-10-CM

## 2018-01-04 LAB — CBC
HCT: 25 % — ABNORMAL LOW (ref 39.0–52.0)
Hemoglobin: 8.5 g/dL — ABNORMAL LOW (ref 13.0–17.0)
MCH: 30.2 pg (ref 26.0–34.0)
MCHC: 34 g/dL (ref 30.0–36.0)
MCV: 89 fL (ref 78.0–100.0)
PLATELETS: 39 10*3/uL — AB (ref 150–400)
RBC: 2.81 MIL/uL — ABNORMAL LOW (ref 4.22–5.81)
RDW: 16.4 % — ABNORMAL HIGH (ref 11.5–15.5)
WBC: 2.7 10*3/uL — ABNORMAL LOW (ref 4.0–10.5)

## 2018-01-04 LAB — TYPE AND SCREEN
ABO/RH(D): A POS
Antibody Screen: NEGATIVE
UNIT DIVISION: 0
UNIT DIVISION: 0

## 2018-01-04 LAB — BPAM RBC
Blood Product Expiration Date: 201901292359
Blood Product Expiration Date: 201901292359
ISSUE DATE / TIME: 201901071943
ISSUE DATE / TIME: 201901071537
UNIT TYPE AND RH: 6200
Unit Type and Rh: 6200

## 2018-01-04 LAB — MRSA PCR SCREENING: MRSA BY PCR: POSITIVE — AB

## 2018-01-04 MED ORDER — ASPIRIN 81 MG PO TBEC: 81.0000 mg | DELAYED_RELEASE_TABLET | Freq: Every day | ORAL | 1 refills | Status: DC

## 2018-01-04 MED ORDER — TRAMADOL HCL 50 MG PO TABS: ORAL_TABLET | ORAL | 2 refills | Status: DC

## 2018-01-04 MED ORDER — HYDRALAZINE HCL 25 MG PO TABS: 25.0000 mg | ORAL_TABLET | Freq: Three times a day (TID) | ORAL | 1 refills | Status: DC

## 2018-01-04 MED ORDER — OXYCODONE-ACETAMINOPHEN 10-325 MG PO TABS: 1.0000 | ORAL_TABLET | Freq: Four times a day (QID) | ORAL | 0 refills | Status: DC | PRN

## 2018-01-04 MED ORDER — HEPARIN SOD (PORK) LOCK FLUSH 100 UNIT/ML IV SOLN
500.0000 [IU] | INTRAVENOUS | Status: AC | PRN
  Administered 2018-01-04: 500 [IU]

## 2018-01-04 MED ORDER — ADULT MULTIVITAMIN W/MINERALS CH: 1.0000 | ORAL_TABLET | Freq: Every day | ORAL | 1 refills | Status: DC

## 2018-01-04 MED ORDER — FUROSEMIDE 40 MG PO TABS
40.0000 mg | ORAL_TABLET | Freq: Every day | ORAL | Status: DC
  Administered 2018-01-04: 40 mg via ORAL
  Filled 2018-01-04: qty 1

## 2018-01-04 MED ORDER — CEFDINIR 300 MG PO CAPS: 300.0000 mg | ORAL_CAPSULE | Freq: Two times a day (BID) | ORAL | 0 refills | Status: DC

## 2018-01-04 MED ORDER — MORPHINE SULFATE (PF) 4 MG/ML IV SOLN
4.0000 mg | Freq: Once | INTRAVENOUS | Status: AC
  Administered 2018-01-04: 4 mg via INTRAVENOUS
  Filled 2018-01-04: qty 1

## 2018-01-04 MED ORDER — OXYCODONE HCL ER 10 MG PO T12A: 10.0000 mg | EXTENDED_RELEASE_TABLET | Freq: Two times a day (BID) | ORAL | 0 refills | Status: DC

## 2018-01-04 MED ORDER — CEFPODOXIME PROXETIL 200 MG PO TABS
200.0000 mg | ORAL_TABLET | Freq: Once | ORAL | Status: DC
  Filled 2018-01-04: qty 1

## 2018-01-04 MED ORDER — FUROSEMIDE 40 MG PO TABS: 40.0000 mg | ORAL_TABLET | Freq: Every day | ORAL | 1 refills | Status: DC

## 2018-01-04 MED ORDER — SULFAMETHOXAZOLE-TRIMETHOPRIM 800-160 MG PO TABS: 1.0000 | ORAL_TABLET | Freq: Two times a day (BID) | ORAL | 0 refills | Status: AC

## 2018-01-04 NOTE — Progress Notes (Signed)
[place] Note  In brief phone communication with Mr Dalton's wife [name], we determined that his admission is not a good time for a chaplain visit, but [name] plans to f/u by phone when they are home and settled. In the meantime, I will mail them a handwritten note of encouragement as a gesture of continuing care and emotional support.   [place], [place], [facility] M-F daytime pager [phone] [facility] 24/7 pager [phone] Voicemail [phone]

## 2018-01-04 NOTE — Progress Notes (Signed)
IP PROGRESS NOTE  Subjective:   There is Charles Coffey reports feeling slightly better compared to yesterday.  He received 2 units of packed red cell transfusion with improvement in his energy and decrease in dizziness.  He still reports some mild dyspnea on exertion.  He denies any headaches or blurry vision.  He does report some mild dizziness when he leans forward which has improved after transfusion.  He denies any chest pain at rest. No active bleeding noted at this time.  Objective:  Vital signs in last 24 hours: Temp:  [97.6 F (36.4 C)-98.4 F (36.9 C)] 97.9 F (36.6 C) (01/08 0415) Pulse Rate:  [65-72] 66 (01/08 0415) Resp:  [17-18] 17 (01/07 2329) BP: (104-120)/(67-92) 120/78 (01/08 0415) SpO2:  [95 %-99 %] 98 % (01/08 0415) Weight change:  Last BM Date: 01/03/18  Intake/Output from previous day: 01/07 0701 - 01/08 0700 In: 1330 [P.O.:600; I.V.:80; Blood:650] Out: 1000 [Urine:1000] Comfortable appearing gentleman without distress. Eyes: Sclera anicteric. Mouth: No oral thrush or ulcers. Resp: clear to auscultation bilaterally without wheezes or dullness to percussion. Cardio: No murmurs or gallops.  Regular rate and rhythm. GI: No rebound or guarding.  Soft nontender. Extremities: No edema noted. Skin: No rashes or lesions. No lymphadenopathy noted on lymphatic examination.   Lab Results: Recent Labs    01/03/18 0852 01/04/18 0846  WBC 3.8* 2.7*  HGB 7.2* 8.5*  HCT 21.3* 25.0*  PLT 38* 39*    BMET Recent Labs    01/02/18 1055 01/03/18 0852  NA 130* 132*  K 3.9 3.9  CL 109 110  CO2 16* 17*  GLUCOSE 146* 99  BUN 39* 35*  CREATININE 1.47* 1.34*  CALCIUM 8.0* 8.1*      Medications: I have reviewed the patient's current medications.  Assessment/Plan:  83 year old gentleman with the following issues  1.  Urothelial carcinoma arising from the left genitourinary tract: His tumor appears to be locally advanced with involvement of the renal pelvis,  ureter and bladder.  His disease has documented T4 involvement with rectal involvement as well as a retroperitoneal involvement that was biopsy-proven as well after a diverting colostomy.  No distant metastasis has be  He received chemotherapy with gemcitabine and carboplatin on December 30, 2017.  He is scheduled to have gemcitabine on January 06, 2018.  I discussed the risks and benefits of resuming chemotherapy this week and the plan is to not proceed with chemotherapy given his acute illness.  Chemotherapy will resume as scheduled on January 20, 2018.  2.  Bilateral hydronephrosis: His kidney function appears to be improving with a creatinine currently at 1.34.  He has nephrostomy tube in place..  3.  Urosepsis: Clinically appears to be stable without any evidence of sepsis.  He is on appropriate antibiotics.  4.  Thrombocytopenia and anemia: Related to systemic chemotherapy.  Platelet count appears to be stable and does not require any transfusion.  His anemia did improve after transfusion.  5.  Deep vein thrombosis: Bilateral lower extremity Dopplers performed on January 04, 2018 showed a short segment of posterior tibial vein to have deep vein thrombosis.  Risks and benefits of anticoagulation versus IVC filter was discussed.  Given the limited involvement of his deep vein thrombosis I have recommended no anticoagulation given his risk of bleeding and thrombocytopenia.  No role for IVC filter at this time after discussing this with interventional radiology.  His risk of propagation is low at this time.  6.  Myocardial infarction: Followed by cardiology.  He has no chest pain noted at this time.  We will continue to follow with you during this hospitalization.    LOS: 3 days   Zola Button 01/04/2018, 12:46 PM

## 2018-01-04 NOTE — Discharge Instructions (Signed)
1)Follow-up with oncologist Dr. [name] on Thursday, 01/06/2018 2)Repeat BMP and CBC on Thursday, 01/06/2018 and every Monday and Friday while on antibiotics specifically Bactrim 3)Take medications as prescribed 4)Avoid ibuprofen/Advil/Aleve/Motrin/Goody Powders/Naproxen/BC powders as these will make you more likely to bleed and can cause stomach ulcers 5)Please see Dr [name] --urologist in 1 week for repeat urine sample/test 6) outpatient follow-up with cardiologist in 2-3 weeks advised 7) low-salt diet advised

## 2018-01-04 NOTE — Telephone Encounter (Signed)
Call in Tramadol 50 mg to take 2 tabs every 6 hours prn pain, #240 with 2 rf

## 2018-01-04 NOTE — Care Management Important Message (Signed)
Important Message  Patient Details  Name: Charles Coffey MRN: [id number] Date of Birth: [birth year]   Medicare Important Message Given:  Yes    Kerin Salen 01/04/2018, 10:47 AMImportant Message  Patient Details  Name: Charles Coffey MRN: [id number] Date of Birth: 09-23-1935   Medicare Important Message Given:  Yes    Kerin Salen 01/04/2018, 10:47 AM

## 2018-01-04 NOTE — Progress Notes (Signed)
Patient placed on Contact precautions per Infectious Disease for MRSA in urine. MRSA by PCR also obtained. Eulas Post, RN

## 2018-01-04 NOTE — Progress Notes (Signed)
Bilateral lower extremity venous duplex completed, Positive for a short segment of acute DVT noted in one branch of the posterior tibial vein coursing from about 3 inches above the ankle to mid calf. There is no evidence of a superficial thrombus or Baker's cyst. Toma Copier, RVS 01/04/2018  9:14 AM

## 2018-01-04 NOTE — Telephone Encounter (Signed)
Called and spoke with pt wife she would like this called into [facility] [address]. Rx called into pharmacy.

## 2018-01-04 NOTE — Discharge Summary (Signed)
Charles Coffey, is a 83 y.o. male  DOB 07/03/1935  MRN [id number].  Admission date:  01/01/2018  Admitting Physician  Roxan Hockey, MD  Discharge Date:  01/04/2018   Primary MD  [name], MD  Recommendations for primary care physician for things to follow:   1)Follow-up with oncologist Dr. [name] on Thursday, 01/06/2018 2)Repeat BMP and CBC on Thursday, 01/06/2018 and every Monday and Friday while on antibiotics specifically Bactrim 3)Take medications as prescribed 4)Avoid ibuprofen/Advil/Aleve/Motrin/Goody Powders/Naproxen/BC powders as these will make you more likely to bleed and can cause stomach ulcers 5)Please see Dr [name] --urologist in 1 week for repeat urine sample/test 6) outpatient follow-up with cardiologist in 2-3 weeks advised 7) low-salt diet advised   Admission Diagnosis  Hyponatremia [E87.1] Pyelonephritis [N12] Thrombocytopenia ([place]) [D69.6] Elevated troponin [R74.8] Urothelial cancer ([place]) [C68.9] Sepsis, due to unspecified organism ([place]) [A41.9] Anemia, unspecified type [D64.9]   Discharge Diagnosis  Hyponatremia [E87.1] Pyelonephritis [N12] Thrombocytopenia ([place]) [D69.6] Elevated troponin [R74.8] Urothelial cancer ([place]) [C68.9] Sepsis, due to unspecified organism ([place]) [A41.9] Anemia, unspecified type [D64.9]    Principal Problem:   Sepsis secondary to UTI [facility]) Active Problems:   Essential hypertension   Anemia   Elevated troponin   Urothelial carcinoma of bladder ([place])   Thrombocytopenia ([place])   Acute lower UTI      Past Medical History:  Diagnosis Date  . Aortic atherosclerosis ([place])   . BPH with urinary obstruction   . CAD (coronary artery disease)    a.  MI 1995, CABG x 3 2002 (patient says that he had LIMA and RIMA grafts). b. ETT-Cardiolite (10/15) with EF 48%, apical scar, no ischemia. c. Nuc 07/2017 abnormal -> cath was performed,  patent LIMA to  LAD and SVG to OM 2. RIMA to RCA is atretic. The right coronary artery is occluded distally with extensive collaterals from the LAD, medical therapy.   . Cancer [facility])    bladder cancer  . Cervical spondylosis without myelopathy 2020-08-2016  . Chronic low back pain    sees Dr. Kary Kos   . GERD (gastroesophageal reflux disease)   . Hiatal hernia   . Hyperlipidemia   . Hypertension   . IBS (irritable bowel syndrome)   . Ischemic cardiomyopathy   . Myocardial infarction ([place])   . RBBB   . Restless legs syndrome   . Statin intolerance   . Syncope    a. in 2015 - no apparent cause, was taking sleep medicine at the time. Cardiac workup unremarkable.  . Tubular adenoma of colon     Past Surgical History:  Procedure Laterality Date  . COLONOSCOPY  10/17/2014   per Dr. Hilarie Fredrickson, tubular adenomas, repeat in 3 yrs   . COLONOSCOPY WITH PROPOFOL N/A 12/01/2017   Procedure: COLONOSCOPY WITH PROPOFOL;  Surgeon: Milus Banister, MD;  Location: [facility];  Service: Endoscopy;  Laterality: N/A;  . CYSTOSCOPY W/ RETROGRADES Left 11/25/2017   Procedure: CYSTOSCOPY WITH RETROGRADE PYELOGRAM;  Surgeon: Irine Seal, MD;  Location: [facility] ORS;  Service: Urology;  Laterality: Left;  . HEART BYPASS    . IR FLUORO GUIDE CV LINE RIGHT  12/27/2017  . IR NEPHROSTOGRAM LEFT THRU EXISTING ACCESS  12/24/2017  . IR NEPHROSTOMY EXCHANGE RIGHT  12/24/2017  . IR NEPHROSTOMY PLACEMENT LEFT  11/28/2017  . IR NEPHROSTOMY PLACEMENT RIGHT  12/03/2017  . IR US GUIDE VASC ACCESS RIGHT  12/27/2017  . LAPAROSCOPY N/A 12/01/2017   Procedure: LAPAROSCOPIC DIVERTING OSTOMY;  Surgeon: Stark Klein, MD;  Location: [facility];  Service: General;  Laterality: N/A;  . LEFT HEART CATH AND CORS/GRAFTS ANGIOGRAPHY N/A 08/25/2017   Procedure: LEFT HEART CATH AND CORS/GRAFTS ANGIOGRAPHY;  Surgeon: Wellington Hampshire, MD;  Location: [place] CV [facility];  Service: Cardiovascular;  Laterality: N/A;  . LUMBAR FUSION  2003   L3-L4  . PROSTATE SURGERY   06-27-12   per Dr. Roni Bread, had CTT  . TONSILLECTOMY    . TRANSURETHRAL RESECTION OF BLADDER TUMOR N/A 11/25/2017   Procedure: TRANSURETHRAL RESECTION OF BLADDER TUMOR (TURBT);  Surgeon: Irine Seal, MD;  Location: [facility];  Service: Urology;  Laterality: N/A;       HPI  from the history and physical done on the day of admission:   Charles Coffey  is a 83 y.o. male  With pmhx relevant for invasive urothelial carcinoma who is s/p cystoscopy and left retrograde pyelogram with TURBT on 11/25/17, subsequently found to have  bilateral hydronephrosis likely from malignant obstruction and pyelo and underwent left PCN on 11/28/17 , pt is also s/p Diverting Colostomy which was performed on 12/01/17, subsequently had  Right PCN tube on 12/03/17. Pt  Received palliative chemo- as per  Dr. [name] 1st dose of carboplatin and gemcitabine given 12/30/17. 1 of the nephrostomy tubes is leaking and was collecting in pt's bladder, Dr. [name] (urology)placed a foley catheter. Urine was  infected, so he was put on cipro. Pt developed a fever of 102 at home on 12/31/17, Dr Burr Medico (Oncology) advised ED eval.  He complains of flank and abdominal pain, no vomiting,  In ED... Is found to have urine that appears infected, WBC 12.5 , temp was 102 at home, he was started on IV cefepime  In ED also found to have elevated troponin over 26, EKG without acute ST elevation, ED provider discussed this with on-call cardiologist Dr. Pierre Bali recommended baby aspirin and conservative treatment without for anticoagulation given thrombocytopenia, patient apparently is not a candidate for intervention of advised against transfer to [facility], please note that the pt had LHC on 08/25/17 with Significant underlying three-vessel coronary artery disease with patent LIMA to LAD and SVG to OM 2. RIMA to RCA is atretic. The right coronary artery is occluded distally with extensive collaterals from the LAD    Hospital Course:    Brief summary  :- LawrenceSingeris a82 y.o.maleWith pmhx relevant forinvasive urothelial carcinomawho is s/pcystoscopy and left retrograde pyelogram with TURBT on 11/25/17, subsequently found to havebilateral hydronephrosis likely from malignant obstruction and pyelo and underwentleft PCN on 11/28/17 , pt is also s/p Diverting Colostomy which was performed on 12/01/17, subsequently hadRight PCN tube on 12/03/17. PtReceivedpalliative chemo- as perDr. [name] 1st dose of carboplatin and gemcitabine given 12/30/17. 1 of the nephrostomy tubes is leaking and was collecting in pt's bladder,Dr. [name] (urology)placed a foley catheter. Urine was infected, so he was put on cipro. Pt developed a fever of 102 at [facility] 12/31/17, admitted 01/01/18 with sepsis from presumed urinary source and elevated troponin in the setting of sepsis.  Plan:-  1)Sepsis secondary to MRSA and Citrobacter UTI-clinically much improved,, discussed with pharmacist, dc home on Bactrim DS 1 bid for 10 days  for MRSA with Repeat BMP every MOnday/Friday while on Bactrim, patient with complicated UTI history of prior invasive urothelial carcinomawithhydronephrosis,Foley removed 01/02/18 by Dr Tresa Moore, pt hasbilateral nephrostomy tubes in situ- treated with  IV cefepime (started 01/01/18), recent urine culture from urology clinic grew Citrobacter sensitive to Rocephin and Cipro, no leukocytosis, afebrile, may discharge home on Omnicef bid for 10 days for Citrobacter  Per Pharmacist    2)Elevated Troponin- No chest pain at this time, echocardiogram from 01/02/18 with EF in 25-30% range (down from 50 %) with diffuse hypokinesis and findings of both systolic and diastolic dysfunction EKG without acute ST elevation, ED provider discussed this with on-call cardiologist Dr. Pierre Bali recommended baby aspirin and conservative treatment without full anticoagulation given thrombocytopenia. Please see full cardiology consult note dated 01/03/2017 from  Dr Jacinto Reap Stanford Breed.  Please note that the pt had LHC on 08/25/17 withSignificant underlying three-vessel coronary artery disease with patent LIMA to LAD and SVG to OM 2. RIMA to RCA is atretic. The right coronary artery is occluded distally with extensive collaterals from the LAD, C/n Coreg,imdur,   aspirin 81 mg, pt is intolerant to statin  3)Obstructive Uropathy/metastatic bladder cancer since 10/2017-  With pmhx relevant forinvasive urothelial carcinomawho is s/pcystoscopy and left retrograde pyelogram with TURBT on 11/25/17, subsequently found to havebilateral hydronephrosis likely from malignant obstruction and pyelo and underwentleft PCN on 11/28/17 , pt is also s/p Diverting Colostomy which was performed on 12/01/17, subsequently hadRight PCN tube on 12/03/17. PtReceivedpalliative chemo- as perDr. [name] 1st dose of carboplatin and gemcitabine given 12/30/17. 1 of the nephrostomy tubes is leaking and was collecting in pt's bladder,Dr. [name] (urology) , please see urology consult from Dr. Tammi Klippel dated 01/02/2018  4)CKD III-creatinine is down to 1.3, baseline is 1.6   avoid nephrotoxic agents maintain adequate hydration, with Repeat BMP every MOnday/Friday while on Bactrim  5)Anemia/Thrombocytopenia- Pt was transfused 2 units of packed cells, no need for platelet transfusion as per patient's oncologist  6)Rt LE DVT- Deep vein thrombosis: Bilateral lower extremity Dopplers performed on January 04, 2018 showed a short segment of Rt posterior tibial vein to have deep vein thrombosis, d/w Dr Barbie Banner from interventional radiology who states the patient is not a candidate for IVC filter placement, D/w Dr [name] who advised against full anticoagulation given increased risk of bleeding and low risk for proximal propagation of this particular DVT.  Follow-up with Dr. [name] on 01/06/2017  Code Status : Full   Disposition Plan  : Home   Consults  :  Urology,    Pharmacist/Cardiology/interventional radiology/oncology  Discharge Condition: stable  Follow UP  Follow-up Information    [facility].   Specialty:  Home Health Services Why:  [facility] nursing Contact information: [address] [place] [address] [phone]           Diet and Activity recommendation:  As advised  Discharge Instructions     Discharge Instructions    (HEART FAILURE PATIENTS) Call MD:  Anytime you have any of the following symptoms: 1) 3 pound weight gain in 24 hours or 5 pounds in 1 week 2) shortness of breath, with or without a dry hacking cough 3) swelling in the hands, feet or stomach 4) if you have to sleep on extra pillows at night in order to breathe.   Complete by:  As directed    Call MD for:  difficulty breathing, headache or visual disturbances   Complete by:  As directed    Call MD for:  persistant dizziness or light-headedness   Complete by:  As directed    Call MD for:  persistant nausea and vomiting   Complete by:  As directed    Call MD for:  redness, tenderness, or signs of infection (pain, swelling, redness, odor or green/yellow discharge around incision site)   Complete by:  As directed    Call MD for:  temperature >100.4   Complete by:  As directed    Diet - low sodium heart healthy   Complete by:  As directed    Discharge instructions   Complete by:  As directed    1)Follow-up with oncologist Dr. [name] on Thursday, 01/06/2018 2)Repeat BMP and CBC on Thursday, 01/06/2018 and every Monday and Friday while on antibiotics specifically Bactrim 3)Take medications as prescribed 4)Avoid ibuprofen/Advil/Aleve/Motrin/Goody Powders/Naproxen/BC powders as these will make you more likely to bleed and can cause stomach ulcers 5)Please see Dr [name] --urologist in 1 week for repeat urine sample/test 6) outpatient follow-up with cardiologist in 2-3 weeks advised 7) low-salt diet advised   Increase activity slowly    Complete by:  As directed         Discharge Medications     Allergies as of 01/04/2018      Reactions   Antihistamines, Loratadine-type Other (See Comments)   Unable to urinate   Statins Other (See Comments)   liver effects      Medication List    STOP taking these medications   ciprofloxacin 250 MG tablet Commonly known as:  CIPRO   oxyCODONE 5 MG immediate release tablet Commonly known as:  ROXICODONE Replaced by:  oxyCODONE 10 mg 12 hr tablet     TAKE these medications   acetaminophen 500 MG tablet Commonly known as:  TYLENOL Take 1,000 mg by mouth every 8 (eight) hours as needed for mild pain or moderate pain.   aspirin 81 MG EC tablet Take 1 tablet (81 mg total) by mouth daily. With food Start taking on:  01/05/2018   carvedilol 6.25 MG tablet Commonly known as:  COREG Take 1 tablet (6.25 mg total) 2 (two) times daily with a meal by mouth.   cefdinir 300 MG capsule Commonly known as:  OMNICEF Take 1 capsule (300 mg total) by mouth 2 (two) times daily.   dicyclomine 10 MG capsule Commonly known as:  BENTYL Take 1-2 by mouth every 6 hours as needed for rectal spasms.   furosemide 40 MG tablet Commonly known as:  LASIX Take 1 tablet (40 mg total) by mouth daily. Start taking on:  01/05/2018   [facility] RE Place 1 application rectally as needed (hemorrhoids).   hydrALAZINE 25 MG tablet Commonly known as:  APRESOLINE Take 1 tablet (25 mg total) by mouth 3 (three) times daily.   isosorbide mononitrate 60 MG 24 hr tablet Commonly known as:  IMDUR Take 1 tablet (60 mg total) by mouth daily.   lidocaine-prilocaine cream Commonly known as:  EMLA Apply 1 application topically as needed.   linaclotide 290 MCG Caps capsule Commonly known as:  LINZESS TAKE 1 CAPSULE (290 MCG TOTAL) BY MOUTH DAILY. 30 MINUTES PRIOR TO A MEAL   lubiprostone 24 MCG capsule Commonly known as:  AMITIZA Take 1 capsule (24 mcg total) by mouth 2 (two) times daily with a  meal.   multivitamin with minerals Tabs  tablet Take 1 tablet by mouth daily. Start taking on:  01/05/2018   nitroGLYCERIN 0.4 MG SL tablet Commonly known as:  NITROSTAT Place 1 tablet (0.4 mg total) under the tongue every 5 (five) minutes as needed. What changed:  reasons to take this   oxyCODONE 10 mg 12 hr tablet Commonly known as:  OXYCONTIN Take 1 tablet (10 mg total) by mouth every 12 (twelve) hours for 4 days. Replaces:  oxyCODONE 5 MG immediate release tablet   oxyCODONE-acetaminophen 10-325 MG tablet Commonly known as:  PERCOCET Take 1 tablet by mouth every 6 (six) hours as needed for up to 4 days for pain.   pramipexole 1 MG tablet Commonly known as:  MIRAPEX TAKE 1 AND 1/2 TABLETS BY MOUTH AT BEDTIME What changed:  See the new instructions.   prochlorperazine 10 MG tablet Commonly known as:  COMPAZINE Take 1 tablet (10 mg total) by mouth every 6 (six) hours as needed for nausea or vomiting.   ranitidine 300 MG tablet Commonly known as:  ZANTAC TAKE 1 TABLET BY MOUTH TWICE A DAY   sulfamethoxazole-trimethoprim 800-160 MG tablet Commonly known as:  BACTRIM DS,SEPTRA DS Take 1 tablet by mouth 2 (two) times daily for 10 days.   temazepam 30 MG capsule Commonly known as:  RESTORIL Take 1 capsule (30 mg total) by mouth at bedtime as needed for sleep.   traMADol 50 MG tablet Commonly known as:  ULTRAM TAKE 1 TABLET BY MOUTH EVERY 6 HOURS AS NEEDED What changed:    how much to take  how to take this  when to take this   TYLENOL PM EXTRA STRENGTH 25-500 MG Tabs tablet Generic drug:  diphenhydramine-acetaminophen Take 1 tablet by mouth at bedtime as needed.   VISINE MAXIMUM REDNESS RELIEF OP Place 2 drops into both eyes 2 (two) times daily as needed (dry eyes).       Major procedures and Radiology Reports - PLEASE review detailed and final reports for all details, in brief -   Dg Chest 2 View  Result Date: 01/03/2018 CLINICAL DATA:  Dyspnea.  History of  hypertension EXAM: CHEST  2 VIEW COMPARISON:  PA and lateral chest x-ray of January 01, 2018 FINDINGS: The lungs are adequately inflated. The interstitial markings are increased diffusely. The pulmonary vascularity is engorged. The cardiac silhouette is enlarged. There are small bilateral pleural effusions layering posteriorly. The sternal wires are intact. The porta catheter tip projects over the midportion of the SVC. The observed bony thorax exhibits no acute abnormality. IMPRESSION: CHF with interstitial edema and small bilateral pleural effusions new since the previous study. Underlying chronic bronchitic changes. Electronically Signed   By: David  Martinique M.D.   On: 01/03/2018 15:29   Dg Chest 2 View  Result Date: 01/01/2018 CLINICAL DATA:  Fever.  History of bladder cancer. EXAM: CHEST  2 VIEW COMPARISON:  11/29/2017; 11/28/2017 ; CT abdomen pelvis - earlier same day FINDINGS: Grossly unchanged enlarged cardiac silhouette and mediastinal contours given reduced lung volumes. Atherosclerotic plaque within thoracic aorta. Post median sternotomy. Stable position of support apparatus. Bilateral infrahilar opacities are unchanged and favored to represent atelectasis. Unchanged diffuse slightly nodular thickening of the pulmonary interstitium. No focal airspace opacities. No pleural effusion or pneumothorax. No evidence of edema. No acute osseus abnormalities. IMPRESSION: Chronic bronchitic change without superimposed acute cardiopulmonary disease. Specifically, no evidence of pneumonia. Electronically Signed   By: Sandi Mariscal M.D.   On: 01/01/2018 14:55   Ct Abdomen Pelvis W Contrast  Result Date: 01/01/2018 CLINICAL DATA:  Bladder cancer, received first dose of chemotherapy 2 days ago, abdominal pain, fever, shortness of breath and weakness. History of nephrostomy, colostomy, coronary artery disease post MI, hypertension, ischemic cardiomyopathy, former smoker EXAM: CT ABDOMEN AND PELVIS WITH CONTRAST  TECHNIQUE: Multidetector CT imaging of the abdomen and pelvis was performed using the standard protocol following bolus administration of intravenous contrast. Sagittal and coronal MPR images reconstructed from axial data set. CONTRAST:  53m ISOVUE-300 IOPAMIDOL (ISOVUE-300) INJECTION 61% IV. No oral contrast. COMPARISON:  12/18/2017 FINDINGS: Lower chest: Peribronchial thickening and interseptal thickening in the lower lobes greater on RIGHT. Tiny BILATERAL pleural effusions greater on RIGHT. Minimal infiltrate in RIGHT lower lobe. Questionable infiltrate versus small nodular focus 8 mm diameter image 2 in RIGHT lower lobe. Hepatobiliary: Mild gallbladder wall thickening with minimal adjacent fluid. Minimal periportal edema. Liver unremarkable. Pancreas: Normal appearance Spleen: Normal appearance Adrenals/Urinary Tract: Adrenal glands normal appearance. BILATERAL nephrostomy tubes without hydronephrosis. Cortical atrophy of LEFT kidney. No definite renal mass. Foley catheter decompresses urinary bladder. Marked bladder wall thickening evident. Stomach/Bowel: Transverse double barrel loop colostomy. Rectal wall thickening, could be related to proctitis either from infection or radiation. Stranding of perirectal fat and presacral fat planes. Stomach and remaining bowel loops unremarkable. Vascular/Lymphatic: Atherosclerotic calcifications aorta and iliac arteries. Coronary arterial calcifications. No definite adenopathy. Reproductive: Enlargement of prostate gland measuring 5.3 x 4.7 x 6.3 cm. Other: Small BILATERAL inguinal hernias LEFT larger than RIGHT containing fat. No free air or free fluid. Musculoskeletal: Prior L3-L4 posterior fusion. Degenerative disc disease changes at L2-L3 and L5-S1. Scattered Schmorl's nodes without definite metastatic bone lesions. IMPRESSION: Diffuse bladder wall thickening which suspect related to known tumor. Diffuse wall thickening of the rectum with perirectal and presacral  infiltrative changes, question infectious proctitis versus prior radiation therapy. Prostatic enlargement. BILATERAL nephrostomy tubes without hydronephrosis. Cortical atrophy LEFT kidney. Mild gallbladder wall thickening question surrounding fluid ; recommend follow-up sonographic assessment. Minimal bibasilar effusions atelectasis as well as minimal infiltrate in RIGHT lower lobe. Question 8 mm diameter RIGHT lower lobe nodule, recommend attention on follow-up imaging. Small BILATERAL inguinal hernias larger on LEFT. Aortic Atherosclerosis (ICD10-I70.0). Electronically Signed   By: MLavonia DanaM.D.   On: 01/01/2018 14:56   Nm Pulmonary Perf And Vent  Result Date: 01/03/2018 CLINICAL DATA:  Three-week history of shortness of breath. EXAM: NUCLEAR MEDICINE VENTILATION - PERFUSION LUNG SCAN TECHNIQUE: Ventilation images were obtained in multiple projections using inhaled aerosol Tc-935mTPA. Perfusion images were obtained in multiple projections after intravenous injection of Tc-9932mA. RADIOPHARMACEUTICALS:  32.4 mCi Technetium-5m26mA aerosol inhalation and 4.4 mCi Technetium-5m 24mIV COMPARISON:  Chest x-ray 01/03/2018 FINDINGS: Ventilation: Patchy ventilation defects Perfusion: Scattered perfusion defects matching the ventilation defects without significant abnormality on the chest x-ray. IMPRESSION: Low probability ventilation perfusion lung scan for pulmonary embolism. Electronically Signed   By: P.  GMarijo Sanes   On: 01/03/2018 15:27   Ir Fluoro Guide Cv Line Right  Result Date: 12/27/2017 INDICATION: History of bladder cancer. In need of durable intravenous access for chemotherapy administration. EXAM: IMPLANTED PORT A CATH PLACEMENT WITH ULTRASOUND AND FLUOROSCOPIC GUIDANCE COMPARISON:  None. MEDICATIONS: Ancef 2 gm IV; The antibiotic was administered within an appropriate time interval prior to skin puncture. ANESTHESIA/SEDATION: Moderate (conscious) sedation was employed during this  procedure. A total of Versed 2 mg and Fentanyl 100 mcg was administered intravenously. Moderate Sedation Time: 27 minutes. The patient's level of consciousness and vital signs were monitored continuously by  radiology nursing throughout the procedure under my direct supervision. CONTRAST:  None FLUOROSCOPY TIME:  54 seconds (23 mGy) COMPLICATIONS: None immediate. PROCEDURE: The procedure, risks, benefits, and alternatives were explained to the patient. Questions regarding the procedure were encouraged and answered. The patient understands and consents to the procedure. The right neck and chest were prepped with chlorhexidine in a sterile fashion, and a sterile drape was applied covering the operative field. Maximum barrier sterile technique with sterile gowns and gloves were used for the procedure. A timeout was performed prior to the initiation of the procedure. Local anesthesia was provided with 1% lidocaine with epinephrine. After creating a small venotomy incision, a micropuncture kit was utilized to access the internal jugular vein. Real-time ultrasound guidance was utilized for vascular access including the acquisition of a permanent ultrasound image documenting patency of the accessed vessel. The microwire was utilized to measure appropriate catheter length. A subcutaneous port pocket was then created along the upper chest wall utilizing a combination of sharp and blunt dissection. The pocket was irrigated with sterile saline. A single lumen thin power injectable port was chosen for placement. The 8 Fr catheter was tunneled from the port pocket site to the venotomy incision. The port was placed in the pocket. The external catheter was trimmed to appropriate length. At the venotomy, an 8 Fr peel-away sheath was placed over a guidewire under fluoroscopic guidance. The catheter was then placed through the sheath and the sheath was removed. Final catheter positioning was confirmed and documented with a  fluoroscopic spot radiograph. The port was accessed with a Huber needle, aspirated and flushed with heparinized saline. The venotomy site was closed with an interrupted 4-0 Vicryl suture. The port pocket incision was closed with interrupted 2-0 Vicryl suture and the skin was opposed with a running subcuticular 4-0 Vicryl suture. Dermabond and Steri-strips were applied to both incisions. Dressings were placed. The patient tolerated the procedure well without immediate post procedural complication. FINDINGS: After catheter placement, the tip lies within the superior cavoatrial junction. The catheter aspirates and flushes normally and is ready for immediate use. IMPRESSION: Successful placement of a right internal jugular approach power injectable Port-A-Cath. The catheter is ready for immediate use. Electronically Signed   By: Sandi Mariscal M.D.   On: 12/27/2017 17:07   Ir US Guide Vasc Access Right  Result Date: 12/27/2017 INDICATION: History of bladder cancer. In need of durable intravenous access for chemotherapy administration. EXAM: IMPLANTED PORT A CATH PLACEMENT WITH ULTRASOUND AND FLUOROSCOPIC GUIDANCE COMPARISON:  None. MEDICATIONS: Ancef 2 gm IV; The antibiotic was administered within an appropriate time interval prior to skin puncture. ANESTHESIA/SEDATION: Moderate (conscious) sedation was employed during this procedure. A total of Versed 2 mg and Fentanyl 100 mcg was administered intravenously. Moderate Sedation Time: 27 minutes. The patient's level of consciousness and vital signs were monitored continuously by radiology nursing throughout the procedure under my direct supervision. CONTRAST:  None FLUOROSCOPY TIME:  54 seconds (23 mGy) COMPLICATIONS: None immediate. PROCEDURE: The procedure, risks, benefits, and alternatives were explained to the patient. Questions regarding the procedure were encouraged and answered. The patient understands and consents to the procedure. The right neck and chest were  prepped with chlorhexidine in a sterile fashion, and a sterile drape was applied covering the operative field. Maximum barrier sterile technique with sterile gowns and gloves were used for the procedure. A timeout was performed prior to the initiation of the procedure. Local anesthesia was provided with 1% lidocaine with epinephrine. After creating a  small venotomy incision, a micropuncture kit was utilized to access the internal jugular vein. Real-time ultrasound guidance was utilized for vascular access including the acquisition of a permanent ultrasound image documenting patency of the accessed vessel. The microwire was utilized to measure appropriate catheter length. A subcutaneous port pocket was then created along the upper chest wall utilizing a combination of sharp and blunt dissection. The pocket was irrigated with sterile saline. A single lumen thin power injectable port was chosen for placement. The 8 Fr catheter was tunneled from the port pocket site to the venotomy incision. The port was placed in the pocket. The external catheter was trimmed to appropriate length. At the venotomy, an 8 Fr peel-away sheath was placed over a guidewire under fluoroscopic guidance. The catheter was then placed through the sheath and the sheath was removed. Final catheter positioning was confirmed and documented with a fluoroscopic spot radiograph. The port was accessed with a Huber needle, aspirated and flushed with heparinized saline. The venotomy site was closed with an interrupted 4-0 Vicryl suture. The port pocket incision was closed with interrupted 2-0 Vicryl suture and the skin was opposed with a running subcuticular 4-0 Vicryl suture. Dermabond and Steri-strips were applied to both incisions. Dressings were placed. The patient tolerated the procedure well without immediate post procedural complication. FINDINGS: After catheter placement, the tip lies within the superior cavoatrial junction. The catheter aspirates  and flushes normally and is ready for immediate use. IMPRESSION: Successful placement of a right internal jugular approach power injectable Port-A-Cath. The catheter is ready for immediate use. Electronically Signed   By: Sandi Mariscal M.D.   On: 12/27/2017 17:07   Ir Nephrostogram Left Thru Existing Access  Result Date: 12/24/2017 INDICATION: Right flank pain.  Right nephrostomy in place. EXAM: RIGHT NEPHROSTOMY EXCHANGE AND RIGHT NEPHROSTOGRAM COMPARISON:  None. MEDICATIONS: None. ANESTHESIA/SEDATION: Fentanyl  mcg IV; Versed  mg IV Moderate Sedation Time: The patient was continuously monitored during the procedure by the interventional radiology nurse under my direct supervision. CONTRAST:  - administered into the collecting system(s) FLUOROSCOPY TIME:  Fluoroscopy Time:  minutes 30 seconds ( mGy). COMPLICATIONS: None immediate. PROCEDURE: Informed written consent was obtained from the patient after a thorough discussion of the procedural risks, benefits and alternatives. All questions were addressed. Maximal Sterile Barrier Technique was utilized including caps, mask, sterile gowns, sterile gloves, sterile drape, hand hygiene and skin antiseptic. A timeout was performed prior to the initiation of the procedure. Contrast was injected into the existing right nephrostomy. It was then cut and exchanged over a Bentson wire for a new 10 French nephrostomy catheter. The new catheter is now coiled in the renal pelvis. It was looped and string fixed then sewn to the skin. Contrast was injected. FINDINGS: Nephrostogram through the existing right nephrostomy demonstrates that the catheter tip is coiled in a calyx. The new catheter is appropriately positioned and coiled in the renal pelvis. IMPRESSION: Successful right nephrostomy catheter exchange. Electronically Signed   By: Marybelle Killings M.D.   On: 12/24/2017 13:51   Ir Nephrostomy Exchange Right  Result Date: 12/24/2017 INDICATION: Right flank pain.  Right  nephrostomy in place. EXAM: RIGHT NEPHROSTOMY EXCHANGE AND RIGHT NEPHROSTOGRAM COMPARISON:  None. MEDICATIONS: None. ANESTHESIA/SEDATION: Fentanyl  mcg IV; Versed  mg IV Moderate Sedation Time: The patient was continuously monitored during the procedure by the interventional radiology nurse under my direct supervision. CONTRAST:  - administered into the collecting system(s) FLUOROSCOPY TIME:  Fluoroscopy Time:  minutes 30 seconds ( mGy). COMPLICATIONS:  None immediate. PROCEDURE: Informed written consent was obtained from the patient after a thorough discussion of the procedural risks, benefits and alternatives. All questions were addressed. Maximal Sterile Barrier Technique was utilized including caps, mask, sterile gowns, sterile gloves, sterile drape, hand hygiene and skin antiseptic. A timeout was performed prior to the initiation of the procedure. Contrast was injected into the existing right nephrostomy. It was then cut and exchanged over a Bentson wire for a new 10 French nephrostomy catheter. The new catheter is now coiled in the renal pelvis. It was looped and string fixed then sewn to the skin. Contrast was injected. FINDINGS: Nephrostogram through the existing right nephrostomy demonstrates that the catheter tip is coiled in a calyx. The new catheter is appropriately positioned and coiled in the renal pelvis. IMPRESSION: Successful right nephrostomy catheter exchange. Electronically Signed   By: Marybelle Killings M.D.   On: 12/24/2017 13:51    Micro Results   Recent Results (from the past 240 hour(s))  Blood culture (routine x 2)     Status: None (Preliminary result)   Collection Time: 01/01/18  1:04 PM  Result Value Ref Range Status   Specimen Description BLOOD RIGHT PORTA CATH  Final   Special Requests   Final    BOTTLES DRAWN AEROBIC AND ANAEROBIC Blood Culture adequate volume   Culture   Final    NO GROWTH 3 DAYS Performed at [facility], [address] [place], [place] [address]     Report Status PENDING  Incomplete  Blood culture (routine x 2)     Status: None (Preliminary result)   Collection Time: 01/01/18  1:04 PM  Result Value Ref Range Status   Specimen Description BLOOD RIGHT ANTECUBITAL  Final   Special Requests   Final    BOTTLES DRAWN AEROBIC AND ANAEROBIC Blood Culture adequate volume   Culture   Final    NO GROWTH 3 DAYS Performed at [facility], [place] [address], [place], [place] [address]    Report Status PENDING  Incomplete  Urine culture     Status: None   Collection Time: 01/01/18  1:04 PM  Result Value Ref Range Status   Specimen Description URINE, RANDOM  Final   Special Requests BLADDER  Final   Culture   Final    NO GROWTH Performed at [facility], [address] [place], [place] [address]    Report Status 01/02/2018 FINAL  Final  Urine culture     Status: Abnormal   Collection Time: 01/01/18  1:04 PM  Result Value Ref Range Status   Specimen Description URINE, RANDOM  Final   Special Requests BLADDER  Final   Culture (A)  Final    >=100,000 COLONIES/mL METHICILLIN RESISTANT STAPHYLOCOCCUS AUREUS   Report Status 01/03/2018 FINAL  Final   Organism ID, Bacteria METHICILLIN RESISTANT STAPHYLOCOCCUS AUREUS (A)  Final      Susceptibility   Methicillin resistant staphylococcus aureus - MIC*    CIPROFLOXACIN >=8 RESISTANT Resistant     GENTAMICIN <=0.5 SENSITIVE Sensitive     NITROFURANTOIN <=16 SENSITIVE Sensitive     OXACILLIN >=4 RESISTANT Resistant     TETRACYCLINE <=1 SENSITIVE Sensitive     VANCOMYCIN <=0.5 SENSITIVE Sensitive     TRIMETH/SULFA <=10 SENSITIVE Sensitive     CLINDAMYCIN <=0.25 SENSITIVE Sensitive     RIFAMPIN <=0.5 SENSITIVE Sensitive     Inducible Clindamycin NEGATIVE Sensitive     * >=100,000 COLONIES/mL METHICILLIN RESISTANT STAPHYLOCOCCUS AUREUS  Urine culture     Status: None   Collection Time: 01/01/18  3:09 PM  Result Value Ref Range Status   Specimen Description URINE, RANDOM  Final   Special  Requests LEFT NEPHROSTOMY  Final   Culture   Final    NO GROWTH Performed at [facility], [address], [place], [place] [address]    Report Status 01/02/2018 FINAL  Final  MRSA PCR Screening     Status: Abnormal   Collection Time: 01/04/18 12:03 PM  Result Value Ref Range Status   MRSA by PCR POSITIVE (A) NEGATIVE Final    Comment:        The GeneXpert MRSA Assay (FDA approved for NASAL specimens only), is one component of a comprehensive MRSA colonization surveillance program. It is not intended to diagnose MRSA infection nor to guide or monitor treatment for MRSA infections. RESULT CALLED TO, READ BACK BY AND VERIFIED WITH: DEUSTCH,J @ 4656 ON [id number] BY POTEAT,S        Today   Subjective    Charles Coffey today has no new complaints, no f/c, no n/v/d          Patient has been seen and examined prior to discharge   Objective   Blood pressure 115/73, pulse (!) 59, temperature 98 F (36.7 C), temperature source Oral, resp. rate (!) 22, height '5\' 8"'$  (1.727 m), weight 88 kg (194 lb), SpO2 100 %.   Intake/Output Summary (Last 24 hours) at 01/04/2018 1739 Last data filed at 01/04/2018 0943 Gross per 24 hour  Intake 1190 ml  Output 850 ml  Net 340 ml   Exam Gen:- Awake Alert,  In no apparent distress  HEENT:- [place].AT, No sclera icterus, HOH Neck-Supple Neck,No JVD,.  Lungs-improved air movement bilaterally, no significant adventitious sounds CV- S1, S2 normal, CABG scar Abd-  +ve B.Sounds, Abd Soft, No tenderness, bilateral nephrostomy tubes, colostomy bag noted Extremity/Skin:- No  edema, despite ultrasound finding of right posterior tibial vein DVT no significant swelling or tenderness noted on exam today   Psych-appropriate affect Neuro-no new focal deficits no tremors   Data Review   CBC w Diff:  Lab Results  Component Value Date   WBC 2.7 (L) 01/04/2018   HGB 8.5 (L) 01/04/2018   HGB 8.8 (L) 12/29/2017   HCT 25.0 (L) 01/04/2018   HCT 26.0 (L)  12/29/2017   PLT 39 (L) 01/04/2018   PLT 70 (L) 12/29/2017   PLT 115 (L) 08/31/2017   LYMPHOPCT 10 01/01/2018   LYMPHOPCT 33.3 12/29/2017   MONOPCT 1 01/01/2018   MONOPCT 15.3 (H) 12/29/2017   EOSPCT 0 01/01/2018   EOSPCT 3.6 12/29/2017   BASOPCT 0 01/01/2018   BASOPCT 0.2 12/29/2017   CMP:  Lab Results  Component Value Date   NA 132 (L) 01/03/2018   NA 133 (L) 12/29/2017   K 3.9 01/03/2018   K 4.4 12/29/2017   CL 110 01/03/2018   CO2 17 (L) 01/03/2018   CO2 20 (L) 12/29/2017   BUN 35 (H) 01/03/2018   BUN 31.5 (H) 12/29/2017   CREATININE 1.34 (H) 01/03/2018   CREATININE 1.6 (H) 12/29/2017   PROT 5.9 (L) 01/02/2018   PROT 6.8 12/29/2017   ALBUMIN 3.1 (L) 01/02/2018   ALBUMIN 3.8 12/29/2017   BILITOT 0.9 01/02/2018   BILITOT 0.43 12/29/2017   ALKPHOS 45 01/02/2018   ALKPHOS 61 12/29/2017   AST 58 (H) 01/02/2018   AST 15 12/29/2017   ALT 28 01/02/2018  ALT 13 12/29/2017      Total Discharge time is about 33 minutes  Roxan Hockey M.D on 01/04/2018 at 5:39 PM  [facility]   Office  [phone]  Voice Recognition Viviann Spare dictation system was used to create this note, attempts have been made to correct errors. Please contact the author with questions and/or clarifications.

## 2018-01-04 NOTE — Progress Notes (Signed)
Request received for IVC filter placement in patient.  Case/latest venous Doppler reviewed by Dr. Barbie Banner -with only short segment tibial vein DVT noted patient is not a candidate for IVC filter placement.  Above discussed with Drs. Shadad and Emokpae.

## 2018-01-04 NOTE — Progress Notes (Signed)
Progress Note  Patient Name: Charles Coffey Date of Encounter: 01/04/2018  Primary Cardiologist: [name], MD   Subjective   Patient seen and examined this AM laying in bed. Reports feeling more energetic after receiving a unit of blood yesterday. Notes improvement in his SOB and bendopnea. Still with some DOE when ambulating to the bathroom. Denies chest pain.   Inpatient Medications    Scheduled Meds: . aspirin EC  81 mg Oral Daily  . carvedilol  6.25 mg Oral BID WC  . dicyclomine  10 mg Oral TID AC & HS  . doxycycline  100 mg Oral Q12H  . hydrALAZINE  25 mg Oral Q8H  . isosorbide mononitrate  60 mg Oral Daily  . linaclotide  290 mcg Oral QAC breakfast  . lubiprostone  24 mcg Oral BID WC  . multivitamin with minerals  1 tablet Oral Daily  . pantoprazole  40 mg Oral Daily  . pramipexole  1.5 mg Oral BID  . protein supplement shake  11 oz Oral Q24H   Continuous Infusions: . sodium chloride 10 mL/hr at 01/03/18 1136  . sodium chloride 10 mL/hr at 01/03/18 1602  . sodium chloride     PRN Meds: acetaminophen, diphenhydrAMINE, hydrocortisone, ipratropium-albuterol, morphine injection, naphazoline-pheniramine, nitroGLYCERIN, oxyCODONE, prochlorperazine, sodium chloride flush, temazepam, traMADol   Vital Signs    Vitals:   01/03/18 1945 01/03/18 2015 01/03/18 2329 01/04/18 0415  BP: 104/67 (!) 112/92 107/68 120/78  Pulse: 67 65 67 66  Resp: 18 18 17    Temp: 98 F (36.7 C) 98.4 F (36.9 C) 97.9 F (36.6 C) 97.9 F (36.6 C)  TempSrc: Oral Oral Oral Oral  SpO2: 95% 99% 96% 98%  Weight:      Height:        Intake/Output Summary (Last 24 hours) at 01/04/2018 0828 Last data filed at 01/04/2018 0200 Gross per 24 hour  Intake 1330 ml  Output 1000 ml  Net 330 ml   Filed Weights   01/01/18 1246 01/01/18 2159  Weight: 188 lb (85.3 kg) 194 lb (88 kg)    Telemetry    PVCs - Personally Reviewed   Physical Exam   GEN: No acute distress.   Neck: No JVD, no  carotid bruits Cardiac: RRR, no murmurs, rubs, or gallops.  Respiratory: Clear to auscultation bilaterally, no wheezes/ rales/ rhonchi GI: NABS, Soft, nontender, non-distended  MS: No edema; No deformity. Neuro:  Nonfocal, moving all extremities spontaneously Psych: Pleasant, normal affect   Labs    Chemistry Recent Labs  Lab 12/29/17 1014 01/01/18 1303 01/02/18 1055 01/03/18 0852  NA 133* 129* 130* 132*  K 4.4 4.5 3.9 3.9  CL  --  104 109 110  CO2 20* 18* 16* 17*  GLUCOSE 102 97 146* 99  BUN 31.5* 50* 39* 35*  CREATININE 1.6* 1.66* 1.47* 1.34*  CALCIUM 9.6 8.8* 8.0* 8.1*  PROT 6.8 6.2* 5.9*  --   ALBUMIN 3.8 3.5 3.1*  --   AST 15 90* 58*  --   ALT 13 30 28   --   ALKPHOS 61 47 45  --   BILITOT 0.43 1.3* 0.9  --   GFRNONAA  --  37* 43* 48*  GFRAA  --  43* 50* 56*  ANIONGAP 8 7 5 5      Hematology Recent Labs  Lab 01/01/18 1303 01/02/18 1055 01/03/18 0852  WBC 12.5* 6.2 3.8*  RBC 2.44* 2.35* 2.36*  HGB 7.5* 7.2* 7.2*  HCT 21.9* 21.2* 21.3*  MCV 89.8 90.2 90.3  MCH 30.7 30.6 30.5  MCHC 34.2 34.0 33.8  RDW 15.9* 16.2* 16.0*  PLT 56* 44* 38*    Cardiac Enzymes Recent Labs  Lab 01/01/18 1303 01/02/18 1055  TROPONINI 26.68* 15.92*   No results for input(s): TROPIPOC in the last 168 hours.   BNPNo results for input(s): BNP, PROBNP in the last 168 hours.   DDimer No results for input(s): DDIMER in the last 168 hours.   Radiology    Dg Chest 2 View  Result Date: 01/03/2018 CLINICAL DATA:  Dyspnea.  History of hypertension EXAM: CHEST  2 VIEW COMPARISON:  PA and lateral chest x-ray of January 01, 2018 FINDINGS: The lungs are adequately inflated. The interstitial markings are increased diffusely. The pulmonary vascularity is engorged. The cardiac silhouette is enlarged. There are small bilateral pleural effusions layering posteriorly. The sternal wires are intact. The porta catheter tip projects over the midportion of the SVC. The observed bony thorax exhibits no  acute abnormality. IMPRESSION: CHF with interstitial edema and small bilateral pleural effusions new since the previous study. Underlying chronic bronchitic changes. Electronically Signed   By: David  Martinique M.D.   On: 01/03/2018 15:29   Nm Pulmonary Perf And Vent  Result Date: 01/03/2018 CLINICAL DATA:  Three-week history of shortness of breath. EXAM: NUCLEAR MEDICINE VENTILATION - PERFUSION LUNG SCAN TECHNIQUE: Ventilation images were obtained in multiple projections using inhaled aerosol Tc-37m DTPA. Perfusion images were obtained in multiple projections after intravenous injection of Tc-17m MAA. RADIOPHARMACEUTICALS:  32.4 mCi Technetium-64m DTPA aerosol inhalation and 4.4 mCi Technetium-34m MAA IV COMPARISON:  Chest x-ray 01/03/2018 FINDINGS: Ventilation: Patchy ventilation defects Perfusion: Scattered perfusion defects matching the ventilation defects without significant abnormality on the chest x-ray. IMPRESSION: Low probability ventilation perfusion lung scan for pulmonary embolism. Electronically Signed   By: Marijo Sanes M.D.   On: 01/03/2018 15:27    Cardiac Studies   ECHO 01/02/18: Study Conclusions  - Left ventricle: The cavity size was mildly dilated. Wall   thickness was normal. Systolic function was severely reduced. The   estimated ejection fraction was in the range of 25% to 30%.   Diffuse hypokinesis. Features are consistent with a pseudonormal   left ventricular filling pattern, with concomitant abnormal   relaxation and increased filling pressure (grade 2 diastolic   dysfunction). - Mitral valve: There was mild regurgitation. - Left atrium: The atrium was moderately to severely dilated. - Right ventricle: Systolic function was mildly to moderately   reduced. - Pulmonary arteries: PA peak pressure: 57 mm Hg (S).  LE Dopplers: pending  Patient Profile     83 y.o. male with PMH of CAD s/p MI and CABG (per 08/18 cath: patent LIMA to LAD and SVG to OM2; RIMA to RCA is  atretic), ischemic cardiomyopathy, HTN, HLD, recent diagnosis of invasive urothelial carcinoma s/p TURBT 10/2017 c/b b/l hydronephrosis 2/2 malignant obstruction s/p PCN and diverting colostomy, on palliative chemotherapy, followed by Cardiology for NSTEMI.   Assessment & Plan    1.NSTEMI in pt with CAD s/p CABG: patient without CP, however did have DOE prior to hospitalization - Trop peak 26.62>>15.92 - EKG with 1 degree AV block, RBBB, PVCs - non-ischemic - Echo with newly decreased EF of 25-30% (previously 50% on Echo 08/2017) - Unfortunately, given disease processes and pancytopenia, patient is not a good candidate for cardiac catheterization. He could no proceed with PCI if indicated as this would require DAPT which is contraindicated in the setting of thrombocytopenia.  Would recommend medical management. - Continue ASA, Coreg, and Imdur. Patient is intolerant to statins  2. Ischemic Cardiomyopathy:  - EF newly reduced to 25-30% - Continue ASA, Coreg, and imdur - Can consider adding hydralazine if BP will allow  3. Dyspnea:  - VQ scan with low probability for PE - LE Dopplers pending  4. Urosepsis:  - MRSA UTI - Continue antibiotics per primary team  5. HTN: BP stable  - Continue coreg and imdur  6. Invasive Urothelial Carcinoma: - Continue management per Oncology/ Urology - PET scan pending   7. HLD: - Intolerant to statins - Referred to Lipid clinic for Repatha - received a call notifying him of 2nd delivery, however patient did not receive the 1st. Will look into this.    For questions or updates, please contact [facility] Please consult [email or web address] for contact info under Cardiology/STEMI.      Signed, Abigail Butts, PA-C  01/04/2018, 8:28 AM   [phone]  As above, patient seen and examined.  He remains dyspneic with exertion but improved compared to yesterday.  He denies chest pain.  He is mildly volume overloaded on examination.  As outlined in  previous notes he has ruled in for a recent non-ST elevation myocardial infarction.  However given multiple comorbidities including recently diagnosed invasive urothelial carcinoma now undergoing palliative chemotherapy, pancytopenia including significant thrombocytopenia he is not a candidate for cardiac catheterization.  As outlined previously if PCI was necessary this would be contraindicated as he would require dual antiplatelet therapy following procedure which would not be an option given his thrombocytopenia.  We will continue with aspirin, carvedilol and hydralazine/nitrates.  I will not add an ACE inhibitor given renal insufficiency.  He has some degree of acute systolic congestive heart failure.  I will begin Lasix 40 mg daily and follow renal function.  Note VQ scan was low probability.  Also note prognosis from malignancy appears to be poor based on previous urology notes.  Kirk Ruths, MD

## 2018-01-05 ENCOUNTER — Telehealth: Payer: Self-pay | Admitting: Internal Medicine

## 2018-01-05 ENCOUNTER — Telehealth: Payer: Self-pay | Admitting: Family Medicine

## 2018-01-05 NOTE — Telephone Encounter (Signed)
Transition Care Management Follow-up Telephone Call   Charles Coffey, is a 83 y.o. male  DOB 03/20/1935  MRN [id number].  Admission date:  01/01/2018  Admitting Physician  Roxan Hockey, MD  Discharge Date:  01/04/2018   Primary MD  [name], MD  Recommendations for primary care physician for things to follow:   1)Follow-up with oncologist Dr. [name] on Thursday, 01/06/2018 2)Repeat BMP and CBC on Thursday, 01/06/2018 and every Monday and Friday while on antibiotics specifically Bactrim 3)Take medications as prescribed 4)Avoid ibuprofen/Advil/Aleve/Motrin/Goody Powders/Naproxen/BC powders as these will make you more likely to bleed and can cause stomach ulcers 5)Please see Dr [name] --urologist in 1 week for repeat urine sample/test 6) outpatient follow-up with cardiologist in 2-3 weeks advised 7) low-salt diet advised   Admission Diagnosis  Hyponatremia [E87.1] Pyelonephritis [N12] Thrombocytopenia ([place]) [D69.6] Elevated troponin [R74.8] Urothelial cancer ([place]) [C68.9] Sepsis, due to unspecified organism ([place]) [A41.9] Anemia, unspecified type [D64.9]    How have you been since you were released from the hospital? "doing better"   Do you understand why you were in the hospital? yes   Do you understand the discharge instructions? yes   Where were you discharged to? Home   Items Reviewed:  Medications reviewed: yes  Allergies reviewed: yes  Dietary changes reviewed: yes  Referrals reviewed: yes   Functional Questionnaire:   Activities of Daily Living (ADLs):   He states they are independent in the following: ambulation, bathing and hygiene, feeding, continence, grooming, toileting and dressing States they require assistance with the following: none   Any transportation issues/concerns?: no   Any patient concerns? no   Confirmed importance and date/time of follow-up visits scheduled yes  Provider Appointment booked with Dr. Sarajane Jews on 01/14/2018  Friday at 1 pm  Confirmed with patient if condition begins to worsen call PCP or go to the ER.  Patient was given the office number and encouraged to call back with question or concerns.  : yes

## 2018-01-05 NOTE — Telephone Encounter (Signed)
Spoke with wife and went over recommendations per Dr. Saunders Revel. Scheduled pt to see Ermalinda Barrios, PA-C on 1/15.  Wife verbalized understanding and was in agreement with this plan.

## 2018-01-05 NOTE — Telephone Encounter (Signed)
Spoke with wife, DPR on file.  Pt d/c'ed from hospital yesterday.  Weight this AM was 193lbs on home scale.  Wife states prior to going to hospital pt's last weight at home was 183lbs.  Pt takes Furosemide 40mg  QD and has slight swelling in bilateral legs.  Pt was weighed at hospital on 01/01/18 and it was 194lbs.   Wife denies pt having SOB. Pt went up a flight of stairs today with no issues. Pt just fatigued today.  No BP or HR available.  Pt feeling better since d/c yesterday but wife seen on d/c papers about weight gain and thought she should call.  Advised wife I would send message to Dr. Saunders Revel to see if any changes need to be made or if we should just continue to monitor for now since recent hospitalization?  Pt scheduled with Dr. Saunders Revel in March.  Will see if he needs a sooner appt?  Pt seeing Oncology tomorrow.

## 2018-01-05 NOTE — Telephone Encounter (Signed)
Charles Coffey was seen during recent hospitalization for sepsis, NSTEMI, and acute systolic heart failure. Furosemide was started at that time due to volume overload, per Dr. Jacalyn Lefevre notes. I recommend that he continue taking furosemide as prescribed at discharge and follow up with an APP within a week. He should continue to monitor his weight at home and let us know if it continues to increase.  [name], MD [facility] Pager: [phone]

## 2018-01-05 NOTE — Telephone Encounter (Signed)
New Message    Pt c/o swelling: STAT is pt has developed SOB within 24 hours  1) How much weight have you gained and in what time span? 6 days 10lbs  2) If swelling, where is the swelling located? Slight swelling in legs  3) Are you currently taking a fluid pill? Lasik  4) Are you currently SOB? no  5) Do you have a log of your daily weights (if so, list)? no  6) Have you gained 3 pounds in a day or 5 pounds in a week? yes  7) Have you traveled recently? No  Patient was just released from hospital last night. Wife states from Thursday of last week until today he has gained 10lbs. She does not recall the hospital taking his weight while he was admitted. Please call.

## 2018-01-06 ENCOUNTER — Telehealth: Payer: Self-pay | Admitting: Oncology

## 2018-01-06 ENCOUNTER — Telehealth: Payer: Self-pay | Admitting: Family Medicine

## 2018-01-06 ENCOUNTER — Encounter: Payer: Self-pay | Admitting: Internal Medicine

## 2018-01-06 ENCOUNTER — Ambulatory Visit: Payer: Medicare HMO

## 2018-01-06 ENCOUNTER — Other Ambulatory Visit: Payer: Self-pay | Admitting: Oncology

## 2018-01-06 ENCOUNTER — Encounter: Payer: Self-pay | Admitting: Oncology

## 2018-01-06 ENCOUNTER — Inpatient Hospital Stay: Payer: Medicare HMO | Attending: Oncology | Admitting: Oncology

## 2018-01-06 ENCOUNTER — Other Ambulatory Visit: Payer: Medicare HMO

## 2018-01-06 VITALS — BP 97/69 | HR 78 | Temp 98.1°F | Resp 18 | Ht 68.0 in | Wt 195.4 lb

## 2018-01-06 DIAGNOSIS — Z5111 Encounter for antineoplastic chemotherapy: Secondary | ICD-10-CM | POA: Insufficient documentation

## 2018-01-06 DIAGNOSIS — C7951 Secondary malignant neoplasm of bone: Secondary | ICD-10-CM

## 2018-01-06 DIAGNOSIS — N130 Hydronephrosis with ureteropelvic junction obstruction: Secondary | ICD-10-CM

## 2018-01-06 DIAGNOSIS — C679 Malignant neoplasm of bladder, unspecified: Secondary | ICD-10-CM | POA: Insufficient documentation

## 2018-01-06 DIAGNOSIS — C786 Secondary malignant neoplasm of retroperitoneum and peritoneum: Secondary | ICD-10-CM | POA: Diagnosis not present

## 2018-01-06 DIAGNOSIS — I82409 Acute embolism and thrombosis of unspecified deep veins of unspecified lower extremity: Secondary | ICD-10-CM | POA: Insufficient documentation

## 2018-01-06 DIAGNOSIS — D6959 Other secondary thrombocytopenia: Secondary | ICD-10-CM | POA: Diagnosis not present

## 2018-01-06 DIAGNOSIS — N1330 Unspecified hydronephrosis: Secondary | ICD-10-CM | POA: Insufficient documentation

## 2018-01-06 DIAGNOSIS — G893 Neoplasm related pain (acute) (chronic): Secondary | ICD-10-CM | POA: Diagnosis not present

## 2018-01-06 DIAGNOSIS — R609 Edema, unspecified: Secondary | ICD-10-CM | POA: Diagnosis not present

## 2018-01-06 DIAGNOSIS — I219 Acute myocardial infarction, unspecified: Secondary | ICD-10-CM | POA: Diagnosis not present

## 2018-01-06 DIAGNOSIS — C785 Secondary malignant neoplasm of large intestine and rectum: Secondary | ICD-10-CM | POA: Diagnosis not present

## 2018-01-06 DIAGNOSIS — Z936 Other artificial openings of urinary tract status: Secondary | ICD-10-CM | POA: Diagnosis not present

## 2018-01-06 DIAGNOSIS — D630 Anemia in neoplastic disease: Secondary | ICD-10-CM | POA: Insufficient documentation

## 2018-01-06 DIAGNOSIS — D6481 Anemia due to antineoplastic chemotherapy: Secondary | ICD-10-CM | POA: Diagnosis not present

## 2018-01-06 DIAGNOSIS — Z5189 Encounter for other specified aftercare: Secondary | ICD-10-CM | POA: Diagnosis not present

## 2018-01-06 LAB — CULTURE, BLOOD (ROUTINE X 2)
Culture: NO GROWTH
Culture: NO GROWTH
SPECIAL REQUESTS: ADEQUATE
Special Requests: ADEQUATE

## 2018-01-06 NOTE — Telephone Encounter (Signed)
Copied from [place] [id number]. Topic: Quick Communication - See Telephone Encounter >> Jan 06, 2018  2:15 PM Ivar Drape wrote: CRM for notification. See Telephone encounter for:  01/06/18. Larey Days w/[facility] [phone] needs a presumption of care order to resume the patient's home healthcare from hospitalization for UTI, MI, and verify some lab work.

## 2018-01-06 NOTE — Telephone Encounter (Signed)
Gave avs and calendar for February  °

## 2018-01-06 NOTE — Progress Notes (Addendum)
Hematology and Oncology Follow Up Visit  Charles Coffey [id number] 23-Jun-1935 83 y.o. 01/06/2018 10:03 AM [name], MDFry, [name], MD   Principle Diagnosis: 83 year old man with advanced urothelial carcinoma involving the left genitourinary tract and bladder diagnosed in November 2018.  He has a disease infiltrating through the bladder into the rectum in addition to retroperitoneal adenopathy indicating stage IV disease.  PDL 1 testing is pending.    Prior Therapy:  He is status post cystoscopy and transurethral resection of a bladder tumor and on 11/25/2017. He is status post bilateral nephrostomy tube placement in November 2018. He is status post diverting colostomy done on 12/01/2017 performed by Dr. Barry Dienes due to colonic obstruction.  Current therapy: Palliative chemotherapy utilizing gemcitabine and carboplatin.  Day 1 of cycle 1 was given on December 30, 2017.  Interim History: Charles Coffey is here for a follow-up visit with his son and wife.  Since last visit, he was hospitalized in the last week and was discharged on January 04, 2018.  He was found to have urosepsis, pancytopenia and developed an elevation in his troponin indicating myocardial infarction without ST elevation.  During his hospitalization, he received intravenous IV hydration, antibiotics and conservative treatment for his acute MI.  He was discharged on pain medication and received 2 units of packed red cell transfusion.  Since his discharge, he reports his pelvic pain has improved.  His pain leading up to that hospitalization was constant associated with spasms in his bladder that is graded almost 8 out of 10 at times.  Since starting OxyContin which he takes twice a day have helped his symptoms.  He does use Percocet as needed every 4-6 hours.  It is unclear how much he is using just yet at this time.  He continues to have lower extremity edema after his hospitalization.  He was prescribed hydralazine as well as  Lasix from a cardiology standpoint.  The nephrostomy tube output has been adequate without bladder urine output at this time.  He is still ambulating although is unsteady on his feet because of the pain medication.  He denies any bleeding at this time including no epistaxis, hematochezia or melena.  He denies any fevers, chest pain or difficulty breathing.  He does not report any headaches, blurry vision, syncope or seizures.  No new neurological deficits.  He does not report any fevers, chills, sweats. He does not report any palpitation, orthopnea. He does not report any cough, wheezing or hemoptysis. He does not report any nausea, vomiting or abdominal pain. He does not report any frequency urgency or hesitancy. He does not report any skeletal complaints rales or myalgias.  He does not report any anxiety or depression.  He does not report any skin rashes or lesions.  He does not report any lymphadenopathy.  He does not report any petechiae or easy bruisability.  Remaining review of systems is negative.  Medications: I have reviewed the patient's current medications.  Current Outpatient Medications  Medication Sig Dispense Refill  . acetaminophen (TYLENOL) 500 MG tablet Take 1,000 mg by mouth every 8 (eight) hours as needed for mild pain or moderate pain.    Marland Kitchen aspirin EC 81 MG EC tablet Take 1 tablet (81 mg total) by mouth daily. With food 30 tablet 1  . carvedilol (COREG) 6.25 MG tablet Take 1 tablet (6.25 mg total) 2 (two) times daily with a meal by mouth. 180 tablet 3  . cefdinir (OMNICEF) 300 MG capsule Take 1 capsule (  300 mg total) by mouth 2 (two) times daily. 20 capsule 0  . dicyclomine (BENTYL) 10 MG capsule Take 1-2 by mouth every 6 hours as needed for rectal spasms. 90 capsule 0  . diphenhydramine-acetaminophen (TYLENOL PM EXTRA STRENGTH) 25-500 MG TABS tablet Take 1 tablet by mouth at bedtime as needed.     . furosemide (LASIX) 40 MG tablet Take 1 tablet (40 mg total) by mouth daily. 30  tablet 1  . hydrALAZINE (APRESOLINE) 25 MG tablet Take 1 tablet (25 mg total) by mouth 3 (three) times daily. 90 tablet 1  . Hydrocortisone Acetate (HEMORRHOIDAL-HC RE) Place 1 application rectally as needed (hemorrhoids).    . isosorbide mononitrate (IMDUR) 60 MG 24 hr tablet Take 1 tablet (60 mg total) by mouth daily. 90 tablet 1  . lidocaine-prilocaine (EMLA) cream Apply 1 application topically as needed. 30 g 0  . linaclotide (LINZESS) 290 MCG CAPS capsule TAKE 1 CAPSULE (290 MCG TOTAL) BY MOUTH DAILY. 30 MINUTES PRIOR TO A MEAL 30 capsule 10  . Multiple Vitamin (MULTIVITAMIN WITH MINERALS) TABS tablet Take 1 tablet by mouth daily. (Patient not taking: Reported on 01/05/2018) 30 tablet 1  . nitroGLYCERIN (NITROSTAT) 0.4 MG SL tablet Place 1 tablet (0.4 mg total) under the tongue every 5 (five) minutes as needed. (Patient taking differently: Place 0.4 mg under the tongue every 5 (five) minutes as needed for chest pain. ) 25 tablet 3  . oxyCODONE (OXYCONTIN) 10 mg 12 hr tablet Take 1 tablet (10 mg total) by mouth every 12 (twelve) hours for 4 days. 8 tablet 0  . oxyCODONE-acetaminophen (PERCOCET) 10-325 MG tablet Take 1 tablet by mouth every 6 (six) hours as needed for up to 4 days for pain. 10 tablet 0  . pramipexole (MIRAPEX) 1 MG tablet TAKE 1 AND 1/2 TABLETS BY MOUTH AT BEDTIME (Patient taking differently: TAKE 1 AND 1/2 TABLETS BY MOUTH TWICE DAILY) 45 tablet 11  . prochlorperazine (COMPAZINE) 10 MG tablet Take 1 tablet (10 mg total) by mouth every 6 (six) hours as needed for nausea or vomiting. 30 tablet 0  . ranitidine (ZANTAC) 300 MG tablet TAKE 1 TABLET BY MOUTH TWICE A DAY 60 tablet 3  . sulfamethoxazole-trimethoprim (BACTRIM DS,SEPTRA DS) 800-160 MG tablet Take 1 tablet by mouth 2 (two) times daily for 10 days. 20 tablet 0  . temazepam (RESTORIL) 30 MG capsule Take 1 capsule (30 mg total) by mouth at bedtime as needed for sleep. 30 capsule 2  . Tetrahydroz-Glyc-Hyprom-PEG (VISINE MAXIMUM  REDNESS RELIEF OP) Place 2 drops into both eyes 2 (two) times daily as needed (dry eyes).     . traMADol (ULTRAM) 50 MG tablet TAKE 1 TABLET BY MOUTH EVERY 6 HOURS AS NEEDED (Patient not taking: Reported on 01/05/2018) 120 tablet 5  . traMADol (ULTRAM) 50 MG tablet Take 2 tablets by mouth every 6 hours as needed for pain 240 tablet 2   No current facility-administered medications for this visit.      Allergies:  Allergies  Allergen Reactions  . Antihistamines, Loratadine-Type Other (See Comments)    Unable to urinate  . Statins Other (See Comments)    liver effects    Past Medical History, Surgical history, Social history, and Family History was reviewed and unchanged at this time.  He denies any alcohol or tobacco use.   Physical Exam: Blood pressure 97/69, pulse 78, temperature 98.1 F (36.7 C), temperature source Oral, resp. rate 18, height 5\' 8"  (1.727 m), weight 195 lb 6.4  oz (88.6 kg), SpO2 98 %. ECOG: 1 General appearance: Chronically ill-appearing gentleman appeared without distress today. Head: Normocephalic, without obvious abnormality  Oral mucosa is moist and pink.  No ulcers or lesions. Sclera is anicteric.  Pupils are equal and round reactive to light. Lymph nodes: Cervical, supraclavicular, and axillary nodes normal. Heart: No murmurs rubs or gallops.  Regular rate and rhythm. Lung: He is throughout auscultation without wheezes or dullness to percussion. Abdomin: Good bowel sounds auscultated.  Soft, nontender without rebound or guarding. Skeletal: 1+ edema noted at the ankle level.  No joint swelling or tenderness. Skin: No rashes or lesions.  No petechiae noted. Psychiatric: Mood and affect is appropriate.   Lab Results: Lab Results  Component Value Date   WBC 2.7 (L) 01/04/2018   HGB 8.5 (L) 01/04/2018   HCT 25.0 (L) 01/04/2018   MCV 89.0 01/04/2018   PLT 39 (L) 01/04/2018     Chemistry      Component Value Date/Time   NA 132 (L) 01/03/2018 0852   NA  133 (L) 12/29/2017 1014   K 3.9 01/03/2018 0852   K 4.4 12/29/2017 1014   CL 110 01/03/2018 0852   CO2 17 (L) 01/03/2018 0852   CO2 20 (L) 12/29/2017 1014   BUN 35 (H) 01/03/2018 0852   BUN 31.5 (H) 12/29/2017 1014   CREATININE 1.34 (H) 01/03/2018 0852   CREATININE 1.6 (H) 12/29/2017 1014      Component Value Date/Time   CALCIUM 8.1 (L) 01/03/2018 0852   CALCIUM 9.6 12/29/2017 1014   ALKPHOS 45 01/02/2018 1055   ALKPHOS 61 12/29/2017 1014   AST 58 (H) 01/02/2018 1055   AST 15 12/29/2017 1014   ALT 28 01/02/2018 1055   ALT 13 12/29/2017 1014   BILITOT 0.9 01/02/2018 1055   BILITOT 0.43 12/29/2017 1014      IMPRESSION: 1. Moderate left hydronephrosis. There is residual contrast filling of the left intrarenal collecting system and portions of the left ureter. This is consistent with moderate obstructive uropathy, which appears due to an area of irregular bladder wall thickening or mass crossing the left ureteral trigone. 2. Mild right hydronephrosis. Right hydronephrosis has increased from the previous CT scan. Left hydronephrosis is unchanged. There is left perinephric and peri ureteral stranding, which is similar to the prior exam which may be the result of the obstruction only, but infection should also be considered. 3. Small right and minimal left pleural effusions. Mild lung base interstitial thickening. Consider mild congestive heart failure given the symptoms of shortness of breath.    Impression and Plan:   83 year old gentleman with the following  1.  Advanced urothelial carcinoma arising from the left genitourinary tract involving renal pelvis, ureter and bladder.  His disease is T4 involvement with rectal involvement as well as a retroperitoneal involvement that was biopsy-proven as well after a diverting colostomy.  No distant metastasis noted.   The diagnosis was discussed again today with the patient, his wife and his son that accompanied him today for  the first time.  I explained the natural course of this disease as well as the treatment options and prognosis.  He understands again that the treatment at this time is palliative with the likelihood of a dramatic response that would result in curative surgery is very low.  I would recommend continuing systemic chemotherapy for palliative purposes in the form of carboplatin and gemcitabine.  He will receive this regimen every 3 weeks without day 8 moving forward because  of cytopenias.  We will continue to assess his tolerance to this chemotherapy and consider switching to an immune therapy if his PDL status confirmed the overexpression.    2.  Bilateral hydronephrosis: He has bilateral nephrostomy tube remained in place and draining appropriately.  3.  Thrombocytopenia: No active bleeding noted at this time.  His thrombocytopenia is related to chemotherapy as well as chronic thrombocytopenia prior to that.  4.  IV access: Cath inserted without complications.  No thrombosis of bleeding noted since last visit.  5.  Antiemetics: Prescription for Compazine is made available to the patient.  No nausea or vomiting noted.  6.  Pain: Related to his bladder cancer.  He is currently on OxyContin at 10 mg twice a day with breakthrough Percocet every 4-6 hours.  This regimen was discussed today in detail and answered all their questions.  We also emphasized the importance of stool softeners to prevent constipation related issues.  7.  Myocardial infarction: He will follow with cardiology regarding this issue.  He does have lower extremity edema currently on Lasix.  He also takes hydralazine.  8.  Deep vein thrombosis: Risks and benefits of anticoagulation was debated again and given the risk of bleeding at this time and the risk of propagation of this deep vein thrombosis is low we will hold off on any anticoagulation.  9.  Follow-up: Will be in 2 weeks to receive cycle 3 of chemotherapy.  30 minutes was  spent today with the patient face-to-face as well as his family.  More than 50% was spent today on education, counseling as well as coordinating his care including prognosis and future treatment options.   Zola Button, MD 1/10/201910:03 AM

## 2018-01-07 ENCOUNTER — Telehealth: Payer: Self-pay | Admitting: Internal Medicine

## 2018-01-07 ENCOUNTER — Other Ambulatory Visit: Payer: Self-pay | Admitting: *Deleted

## 2018-01-07 DIAGNOSIS — C679 Malignant neoplasm of bladder, unspecified: Secondary | ICD-10-CM | POA: Diagnosis not present

## 2018-01-07 MED ORDER — OXYCODONE HCL ER 10 MG PO T12A: 10.0000 mg | EXTENDED_RELEASE_TABLET | Freq: Two times a day (BID) | ORAL | 0 refills | Status: DC

## 2018-01-07 MED ORDER — OXYCODONE-ACETAMINOPHEN 10-325 MG PO TABS: 1.0000 | ORAL_TABLET | ORAL | 0 refills | Status: DC | PRN

## 2018-01-07 NOTE — Telephone Encounter (Signed)
Resume home health orders

## 2018-01-07 NOTE — Telephone Encounter (Signed)
Called and spoke with Cecille Rubin. Rolland Porter the OK for verbal orders that were requested.

## 2018-01-07 NOTE — Telephone Encounter (Signed)
Spoke to patient and son on speaker phone, gave them Dr. Loletha Carrow' recommendations and things to watch for. They do report that he did start having some good results of the Linzess after earlier phone call, has already had to change the colostomy bag once today. They are aware of actions to take if this should happen in the future, call office if they have questions or concerns.

## 2018-01-07 NOTE — Telephone Encounter (Signed)
Reviewed last few TC notes and last [facility] office note.  No, he cannot take another Linzess dose today. Do not take dicyclomine if not having a BM daily.  Recommend miralax , one packet/capful today.  Can be done 2-3 times/day if needed to help start BMs again. If develops worsening abd pain, vomiting, distension - proceed to ED.

## 2018-01-07 NOTE — Telephone Encounter (Signed)
Sent to PCP ?

## 2018-01-07 NOTE — Telephone Encounter (Signed)
Routed to DOD, patient of Dr. Hilarie Fredrickson, patient states that the Linzess 290 mcg has been working great, recent diverting colostomy. Today he took the medication and has not had any results in his bag. Please advise.

## 2018-01-07 NOTE — Telephone Encounter (Signed)
Instructed her that prescriptions are ready for pickup. She verbalized understanding.

## 2018-01-10 ENCOUNTER — Encounter: Payer: Self-pay | Admitting: Oncology

## 2018-01-10 ENCOUNTER — Telehealth: Payer: Self-pay | Admitting: Family Medicine

## 2018-01-10 NOTE — Progress Notes (Signed)
Submitted auth request for Oxycontin today.  Status is pending.  °

## 2018-01-10 NOTE — Telephone Encounter (Signed)
Spoke to patient's wife. His SOB is not new. Noted some fluid in lungs at hosp. Given lasix. Had pedal edema in hosp also which has improved. Weight was up 10 pounds on discharge. Now back to normal weight. They are walking around a car dealership currently.  Denies SOB, denies abd bloating. Cannot lie flat w/o feeling smothered.  Starts to panic. Using a recliner. Advised to lay as upright as needed to be comfortable for now. Sees cardiac APP tomorrow but this afternoon has f/u with surgeon.

## 2018-01-10 NOTE — Telephone Encounter (Signed)
Copied from [place] [id number]. Topic: Quick Communication - See Telephone Encounter >> Jan 10, 2018 12:56 PM Cleaster Corin, NT wrote: CRM for notification. See Telephone encounter for:   01/10/18. Patty from [facility] calling to make sure it was ok to draw labs (BPM and CVC)tomorrow 01-11-18.pt. Has an appt. Today and will not be home until late.  Chong Sicilian can be reached at [phone] ok to leave vm

## 2018-01-10 NOTE — Progress Notes (Signed)
Submitted auth request for Oxycodone today.  Status is pending.  °

## 2018-01-11 ENCOUNTER — Encounter: Payer: Self-pay | Admitting: Oncology

## 2018-01-11 ENCOUNTER — Encounter: Payer: Self-pay | Admitting: *Deleted

## 2018-01-11 ENCOUNTER — Telehealth: Payer: Self-pay | Admitting: *Deleted

## 2018-01-11 ENCOUNTER — Encounter: Payer: Self-pay | Admitting: Physician Assistant

## 2018-01-11 ENCOUNTER — Ambulatory Visit: Payer: Medicare HMO | Admitting: Physician Assistant

## 2018-01-11 VITALS — BP 122/64 | HR 78 | Ht 68.0 in | Wt 185.4 lb

## 2018-01-11 DIAGNOSIS — I82409 Acute embolism and thrombosis of unspecified deep veins of unspecified lower extremity: Secondary | ICD-10-CM | POA: Insufficient documentation

## 2018-01-11 DIAGNOSIS — I82441 Acute embolism and thrombosis of right tibial vein: Secondary | ICD-10-CM | POA: Diagnosis not present

## 2018-01-11 DIAGNOSIS — C679 Malignant neoplasm of bladder, unspecified: Secondary | ICD-10-CM | POA: Diagnosis not present

## 2018-01-11 DIAGNOSIS — I10 Essential (primary) hypertension: Secondary | ICD-10-CM | POA: Diagnosis not present

## 2018-01-11 DIAGNOSIS — I25118 Atherosclerotic heart disease of native coronary artery with other forms of angina pectoris: Secondary | ICD-10-CM

## 2018-01-11 DIAGNOSIS — N183 Chronic kidney disease, stage 3 unspecified: Secondary | ICD-10-CM

## 2018-01-11 DIAGNOSIS — I252 Old myocardial infarction: Secondary | ICD-10-CM | POA: Diagnosis not present

## 2018-01-11 DIAGNOSIS — E785 Hyperlipidemia, unspecified: Secondary | ICD-10-CM | POA: Diagnosis not present

## 2018-01-11 DIAGNOSIS — D649 Anemia, unspecified: Secondary | ICD-10-CM | POA: Diagnosis not present

## 2018-01-11 DIAGNOSIS — I255 Ischemic cardiomyopathy: Secondary | ICD-10-CM | POA: Diagnosis not present

## 2018-01-11 NOTE — Telephone Encounter (Signed)
Per dr [name] okay that cardiologist discontinued asa 81 mg tablets. [id number] insurance denial, patient states that his wife will call tomorrow and explain what they need. He was not sure.

## 2018-01-11 NOTE — Progress Notes (Signed)
Cardiology Office Note    Date:  01/11/2018   ID:  Charles Coffey, DOB [birth year], MRN [id number]  PCP:  [name], MD  Cardiologist: [name], MD  Chief Complaint  Patient presents with  . Follow-up    History of Present Illness:  ELEUTERIO DOLLAR is a 83 y.o. male with history of CAD status post [place], CABG x3 in 2002, cath 07/2017 patent LIMA to the LAD and SVG to the OM 2, RIMA to the RCA was atretic and RCA is occluded distal with extensive collaterals from the LAD.  Medical therapy recommended.  Ischemic cardiomyopathy ejection fraction 25-30%, hypertension, HLD, recent diagnosis of invasive urethral carcinoma.  Discharge from the hospital 01/04/18 after admission with sepsis secondary to UTI and NSTEMI without chest pain but did have dyspnea on exertion and peak troponin of 26.62.  2D echo showed newly decreased LV function EF 25-30% previously 50% 08/2017.Marland Kitchen  Unfortunately given his disease process and pancytopenia patient was not a good candidate for cardiac catheterization.  He could not proceed with PCI if indicated as this would require DA PT which is contraindicated in the setting of thrombocytopenia.  Medical therapy recommended.  Intolerant to statins and was referred to the lipid clinic for [place].  He also had an acute DVT in the posterior tibial vein and peroneal vein.  Patient comes in today for follow-up accompanied by his wife.  He is quite weak.  He is not taking aspirin because of his low platelets.  His last platelet count was 39,000.  He called in to the office complaining of orthopnea.  He was having to sleep in the recliner.  Dr. Saunders Revel told him to increase his Lasix to 80 mg daily yesterday and this is the first day he is feeling better.  He says he put out extra urine and his swelling went down.  He could breathe easier when he tried to lay down this morning.  He has been getting extra salt in his diet eating potato chips yesterday.  Past Medical History:   Diagnosis Date  . Aortic atherosclerosis ([place])   . BPH with urinary obstruction   . CAD (coronary artery disease)    a.  MI 1995, CABG x 3 2002 (patient says that he had LIMA and RIMA grafts). b. ETT-Cardiolite (10/15) with EF 48%, apical scar, no ischemia. c. Nuc 07/2017 abnormal -> cath was performed,  patent LIMA to LAD and SVG to OM 2. RIMA to RCA is atretic. The right coronary artery is occluded distally with extensive collaterals from the LAD, medical therapy.   . Cancer [facility])    bladder cancer  . Cervical spondylosis without myelopathy 03-Sep-202017  . Chronic low back pain    sees Dr. Kary Kos   . GERD (gastroesophageal reflux disease)   . Hiatal hernia   . Hyperlipidemia   . Hypertension   . IBS (irritable bowel syndrome)   . Ischemic cardiomyopathy   . Myocardial infarction ([place])   . RBBB   . Restless legs syndrome   . Statin intolerance   . Syncope    a. in 2015 - no apparent cause, was taking sleep medicine at the time. Cardiac workup unremarkable.  . Tubular adenoma of colon     Past Surgical History:  Procedure Laterality Date  . COLONOSCOPY  10/17/2014   per Dr. Hilarie Fredrickson, tubular adenomas, repeat in 3 yrs   . COLONOSCOPY WITH PROPOFOL N/A 12/01/2017   Procedure: COLONOSCOPY WITH PROPOFOL;  Surgeon: Milus Banister, MD;  Location: [facility];  Service: Endoscopy;  Laterality: N/A;  . CYSTOSCOPY W/ RETROGRADES Left 11/25/2017   Procedure: CYSTOSCOPY WITH RETROGRADE PYELOGRAM;  Surgeon: Irine Seal, MD;  Location: [facility];  Service: Urology;  Laterality: Left;  . HEART BYPASS    . IR FLUORO GUIDE CV LINE RIGHT  12/27/2017  . IR NEPHROSTOGRAM LEFT THRU EXISTING ACCESS  12/24/2017  . IR NEPHROSTOMY EXCHANGE RIGHT  12/24/2017  . IR NEPHROSTOMY PLACEMENT LEFT  11/28/2017  . IR NEPHROSTOMY PLACEMENT RIGHT  12/03/2017  . IR US GUIDE VASC ACCESS RIGHT  12/27/2017  . LAPAROSCOPY N/A 12/01/2017   Procedure: LAPAROSCOPIC DIVERTING OSTOMY;  Surgeon: Stark Klein, MD;  Location:  [facility];  Service: General;  Laterality: N/A;  . LEFT HEART CATH AND CORS/GRAFTS ANGIOGRAPHY N/A 08/25/2017   Procedure: LEFT HEART CATH AND CORS/GRAFTS ANGIOGRAPHY;  Surgeon: Wellington Hampshire, MD;  Location: [facility];  Service: Cardiovascular;  Laterality: N/A;  . LUMBAR FUSION  2003   L3-L4  . PROSTATE SURGERY  06-27-12   per Dr. Roni Bread, had CTT  . TONSILLECTOMY    . TRANSURETHRAL RESECTION OF BLADDER TUMOR N/A 11/25/2017   Procedure: TRANSURETHRAL RESECTION OF BLADDER TUMOR (TURBT);  Surgeon: Irine Seal, MD;  Location: [facility];  Service: Urology;  Laterality: N/A;    Current Medications: Current Meds  Medication Sig  . acetaminophen (TYLENOL) 500 MG tablet Take 1,000 mg by mouth every 8 (eight) hours as needed for mild pain or moderate pain.  . carvedilol (COREG) 6.25 MG tablet Take 1 tablet (6.25 mg total) 2 (two) times daily with a meal by mouth.  . cefdinir (OMNICEF) 300 MG capsule Take 1 capsule (300 mg total) by mouth 2 (two) times daily.  Marland Kitchen dicyclomine (BENTYL) 10 MG capsule Take 1-2 by mouth every 6 hours as needed for rectal spasms.  . diphenhydramine-acetaminophen (TYLENOL PM EXTRA STRENGTH) 25-500 MG TABS tablet Take 1 tablet by mouth at bedtime as needed.   . furosemide (LASIX) 40 MG tablet Take 1 tablet (40 mg total) by mouth daily.  . hydrALAZINE (APRESOLINE) 25 MG tablet Take 1 tablet (25 mg total) by mouth 3 (three) times daily.  . Hydrocortisone Acetate (HEMORRHOIDAL-HC RE) Place 1 application rectally as needed (hemorrhoids).  . isosorbide mononitrate (IMDUR) 60 MG 24 hr tablet Take 1 tablet (60 mg total) by mouth daily.  Marland Kitchen lidocaine-prilocaine (EMLA) cream Apply 1 application topically as needed.  . linaclotide (LINZESS) 290 MCG CAPS capsule TAKE 1 CAPSULE (290 MCG TOTAL) BY MOUTH DAILY. 30 MINUTES PRIOR TO A MEAL  . Multiple Vitamin (MULTIVITAMIN WITH MINERALS) TABS tablet Take 1 tablet by mouth daily.  Marland Kitchen oxyCODONE (OXYCONTIN) 10 mg 12 hr tablet Take 1 tablet (10  mg total) by mouth 2 (two) times daily.  Marland Kitchen oxyCODONE-acetaminophen (PERCOCET) 10-325 MG tablet Take 1 tablet by mouth every 4 (four) hours as needed for pain.  . pramipexole (MIRAPEX) 1 MG tablet Take 1 mg by mouth daily.  . prochlorperazine (COMPAZINE) 10 MG tablet Take 1 tablet (10 mg total) by mouth every 6 (six) hours as needed for nausea or vomiting.  . ranitidine (ZANTAC) 300 MG tablet TAKE 1 TABLET BY MOUTH TWICE A DAY  . sulfamethoxazole-trimethoprim (BACTRIM DS,SEPTRA DS) 800-160 MG tablet Take 1 tablet by mouth 2 (two) times daily for 10 days.  . temazepam (RESTORIL) 30 MG capsule Take 1 capsule (30 mg total) by mouth at bedtime as needed for sleep.  . Tetrahydroz-Glyc-Hyprom-PEG (VISINE  MAXIMUM REDNESS RELIEF OP) Place 2 drops into both eyes 2 (two) times daily as needed (dry eyes).      Allergies:   Antihistamines, loratadine-type and Statins   Social History   Socioeconomic History  . Marital status: Married    Spouse name: None  . Number of children: 5  . Years of education: Some coll  . Highest education level: None  Social Needs  . Financial resource strain: None  . Food insecurity - worry: None  . Food insecurity - inability: None  . Transportation needs - medical: None  . Transportation needs - non-medical: None  Occupational History  . Occupation: retired  Tobacco Use  . Smoking status: Former Smoker    Types: Cigarettes    Last attempt to quit: 12/28/1978    Years since quitting: 39.0  . Smokeless tobacco: Never Used  Substance and Sexual Activity  . Alcohol use: Yes    Alcohol/week: 0.0 oz    Comment: occ-  . Drug use: No  . Sexual activity: None  Other Topics Concern  . None  Social History Narrative   Lives at home w/ his wife   Right-handed   5 cups of coffee per day     Family History:  The patient's family history includes Aneurysm in his father; Heart attack in his mother; Heart disease in his brother, maternal uncle, and mother.   ROS:     Please see the history of present illness.    Review of Systems  Constitution: Positive for weakness and malaise/fatigue.  Cardiovascular: Positive for dyspnea on exertion and leg swelling.  Respiratory: Positive for shortness of breath and sleep disturbances due to breathing.    All other systems reviewed and are negative.   PHYSICAL EXAM:   VS:  BP 122/64   Pulse 78   Ht 5\' 8"  (1.727 m)   Wt 185 lb 6.4 oz (84.1 kg)   SpO2 97%   BMI 28.19 kg/m   Physical Exam  GEN: Thin, in no acute distress  Neck: no JVD, carotid bruits, or masses Cardiac:RRR; 2/6 systolic murmur at the left sternal border Respiratory:  clear to auscultation bilaterally, normal work of breathing GI: soft, nontender, nondistended, + BS Lower extremities +1 edema bilaterally good distal pulses bilaterally Neuro:  Alert and Oriented x 3 Psych: euthymic mood, full affect  Wt Readings from Last 3 Encounters:  01/11/18 185 lb 6.4 oz (84.1 kg)  01/06/18 195 lb 6.4 oz (88.6 kg)  01/01/18 194 lb (88 kg)      Studies/Labs Reviewed:   EKG:  EKG is not ordered today.  Recent Labs: 08/19/2017: TSH 1.090 11/29/2017: Magnesium 2.7 01/02/2018: ALT 28 01/03/2018: BUN 35; Creatinine, Ser 1.34; Potassium 3.9; Sodium 132 01/04/2018: Hemoglobin 8.5; Platelets 39   Lipid Panel    Component Value Date/Time   CHOL 122 09/21/2017 1521   TRIG 79 09/21/2017 1521   HDL 41 09/21/2017 1521   CHOLHDL 3.0 09/21/2017 1521   CHOLHDL 3 12/01/2016 0827   VLDL 17.0 12/01/2016 0827   LDLCALC 65 09/21/2017 1521   LDLDIRECT 72 09/21/2017 1521    Additional studies/ records that were reviewed today include:   ECHO 01/02/18: Study Conclusions   - Left ventricle: The cavity size was mildly dilated. Wall   thickness was normal. Systolic function was severely reduced. The   estimated ejection fraction was in the range of 25% to 30%.   Diffuse hypokinesis. Features are consistent with a pseudonormal   left ventricular filling  pattern,  with concomitant abnormal   relaxation and increased filling pressure (grade 2 diastolic   dysfunction). - Mitral valve: There was mild regurgitation. - Left atrium: The atrium was moderately to severely dilated. - Right ventricle: Systolic function was mildly to moderately   reduced. - Pulmonary arteries: PA peak pressure: 57 mm Hg (S).   Lower extremity venous Dopplers 01/04/18 Final Interpretation Right: There is evidence of acute DVT in the Posterior Tibial vein, and Peroneal vein. Acute deep vein thrombus noted in one of the posterior tibial veins coursing from approximately 3 inches above the ankle to mid calf. There is also noted an acute deep  vein thrombous in the mid calf. There is no evidence of a suoerficial thrombosis. There is no evidence of a Baker's cyst. Left: There is no evidence of deep vein thrombosis in the lower extremity.There is no evidence of superficial venous thrombosis. There is no evidence of a Baker's cyst.      ASSESSMENT:    1. Cardiomyopathy, ischemic   2. Coronary artery disease of native artery of native heart with stable angina pectoris ([place])   3. Essential hypertension   4. Stage 3 chronic kidney disease ([place])   5. Hyperlipidemia LDL goal <70   6. Deep vein thrombosis (DVT) of tibial vein of right lower extremity, unspecified chronicity ([facility])      PLAN:  In order of problems listed above: Ischemic cardiomyopathy LVEF now 25-30% with recent NSTEMI treated conservatively given thrombocytopenia, metastatic bladder cancer and recent sepsis.  On palliative chemo.  Cath 07/2017 LIMA to the LAD and SVG to OM 2 was patent RIMA to the RCA is atretic RCA is occluded distally with extensive collaterals from the LAD.  Patient had symptoms of CHF and Lasix was increased yesterday to 80 mg daily with good result.  We will continue this dose for 2 more days then back to 40 mg daily.  2 g sodium diet discussed with patient and wife.  Check labs today.  Patient was  sent home on aspirin but he is not taking and most recent platelet count was only 39,000.  I have asked him to discuss this with with oncology.  Unfortunately he also had a small DVT in the hospital but he is between a rock and a hard place with his low platelets.   CAD with recent MI in the hospital troponin over 26 without acute ST elevation.  New LV dysfunction.  No angina.  Continue medical management.  I did not titrate his medications up today because his blood pressures been low and he has had some dizziness.  Essential hypertension blood pressures been on the low side so we will not titrate medications  Stage III CKD has follow-up with renal mid February.  Will check labs today.  Right lower extremity DVT 01/04/18.  Patient is not a candidate for IVC filter and cannot have full anticoagulation because of thrombocytopenia.  It was felt this was low risk for proximal propagation. Medication Adjustments/Labs and Tests Ordered: Current medicines are reviewed at length with the patient today.  Concerns regarding medicines are outlined above.  Medication changes, Labs and Tests ordered today are listed in the Patient Instructions below. Patient Instructions   Medication Instructions:  Your physician has recommended you make the following change in your medication:  1. Check with Oncologist about taking Asprin 2. Increase Lasix  two tablets (80 mg ) for two days than decrease back to (40 mg) daily.  Can take extra tablet  as needed for SOB or 2-3 lbs weight gain in 24 hours.    Labwork: Please get a BMET/BNP with home health today, patients received script today.  Testing/Procedures: -None  Follow-Up: Your physician recommends that you keep your scheduled follow-up appointment with Estella Husk, PA and Dr. Saunders Revel.   Any Other Special Instructions Will Be Listed Below (If Applicable).   Please get compression hose, script given to patient today.     Two Gram Sodium Diet 2000  mg  What is Sodium? Sodium is a mineral found naturally in many foods. The most significant source of sodium in the diet is table salt, which is about 40% sodium.  Processed, convenience, and preserved foods also contain a large amount of sodium.  The body needs only 500 mg of sodium daily to function,  A normal diet provides more than enough sodium even if you do not use salt.  Why Limit Sodium? A build up of sodium in the body can cause thirst, increased blood pressure, shortness of breath, and water retention.  Decreasing sodium in the diet can reduce edema and risk of heart attack or stroke associated with high blood pressure.  Keep in mind that there are many other factors involved in these health problems.  Heredity, obesity, lack of exercise, cigarette smoking, stress and what you eat all play a role.  General Guidelines:  Do not add salt at the table or in cooking.  One teaspoon of salt contains over 2 grams of sodium.  Read food labels  Avoid processed and convenience foods  Ask your dietitian before eating any foods not dicussed in the menu planning guidelines  Consult your physician if you wish to use a salt substitute or a sodium containing medication such as antacids.  Limit milk and milk products to 16 oz (2 cups) per day.  Shopping Hints:  READ LABELS!! "Dietetic" does not necessarily mean low sodium.  Salt and other sodium ingredients are often added to foods during processing.   Menu Planning Guidelines Food Group Choose More Often Avoid  Beverages (see also the milk group All fruit juices, low-sodium, salt-free vegetables juices, low-sodium carbonated beverages Regular vegetable or tomato juices, commercially softened water used for drinking or cooking  Breads and Cereals Enriched white, wheat, rye and pumpernickel bread, hard rolls and dinner rolls; muffins, cornbread and waffles; most dry cereals, cooked cereal without added salt; unsalted crackers and breadsticks; low  sodium or homemade bread crumbs Bread, rolls and crackers with salted tops; quick breads; instant hot cereals; pancakes; commercial bread stuffing; self-rising flower and biscuit mixes; regular bread crumbs or cracker crumbs  Desserts and Sweets Desserts and sweets mad with mild should be within allowance Instant pudding mixes and cake mixes  Fats Butter or margarine; vegetable oils; unsalted salad dressings, regular salad dressings limited to 1 Tbs; light, sour and heavy cream Regular salad dressings containing bacon fat, bacon bits, and salt pork; snack dips made with instant soup mixes or processed cheese; salted nuts  Fruits Most fresh, frozen and canned fruits Fruits processed with salt or sodium-containing ingredient (some dried fruits are processed with sodium sulfites        Vegetables Fresh, frozen vegetables and low- sodium canned vegetables Regular canned vegetables, sauerkraut, pickled vegetables, and others prepared in brine; frozen vegetables in sauces; vegetables seasoned with ham, bacon or salt pork  Condiments, Sauces, Miscellaneous  Salt substitute with physician's approval; pepper, herbs, spices; vinegar, lemon or lime juice; hot pepper sauce; garlic powder, onion powder,  low sodium soy sauce (1 Tbs.); low sodium condiments (ketchup, chili sauce, mustard) in limited amounts (1 tsp.) fresh ground horseradish; unsalted tortilla chips, pretzels, potato chips, popcorn, salsa (1/4 cup) Any seasoning made with salt including garlic salt, celery salt, onion salt, and seasoned salt; sea salt, rock salt, kosher salt; meat tenderizers; monosodium glutamate; mustard, regular soy sauce, barbecue, sauce, chili sauce, teriyaki sauce, steak sauce, Worcestershire sauce, and most flavored vinegars; canned gravy and mixes; regular condiments; salted snack foods, olives, picles, relish, horseradish sauce, catsup   Food preparation: Try these seasonings Meats:    Pork Sage, onion Serve with applesauce   Chicken Poultry seasoning, thyme, parsley Serve with cranberry sauce  Lamb Curry powder, rosemary, garlic, thyme Serve with mint sauce or jelly  Veal Marjoram, basil Serve with current jelly, cranberry sauce  Beef Pepper, bay leaf Serve with dry mustard, unsalted chive butter  Fish [place] leaf, dill Serve with unsalted lemon butter, unsalted parsley butter  Vegetables:    Asparagus Lemon juice   Broccoli Lemon juice   Carrots Mustard dressing parsley, mint, nutmeg, glazed with unsalted butter and sugar   Green beans Marjoram, lemon juice, nutmeg,dill seed   Tomatoes Basil, marjoram, onion   Spice /blend for [employer]" 4 tsp ground thyme 1 tsp ground sage 3 tsp ground rosemary 4 tsp ground marjoram   Test your knowledge 1. A product that says "Salt Free" may still contain sodium. True or False 2. Garlic Powder and Hot Pepper Sauce an be used as alternative seasonings.True or False 3. Processed foods have more sodium than fresh foods.  True or False 4. Canned Vegetables have less sodium than froze True or False  WAYS TO DECREASE YOUR SODIUM INTAKE 1. Avoid the use of added salt in cooking and at the table.  Table salt (and other prepared seasonings which contain salt) is probably one of the greatest sources of sodium in the diet.  Unsalted foods can gain flavor from the sweet, sour, and butter taste sensations of herbs and spices.  Instead of using salt for seasoning, try the following seasonings with the foods listed.  Remember: how you use them to enhance natural food flavors is limited only by your creativity... Allspice-Meat, fish, eggs, fruit, peas, red and yellow vegetables Almond Extract-Fruit baked goods Anise Seed-Sweet breads, fruit, carrots, beets, cottage cheese, cookies (tastes like licorice) Basil-Meat, fish, eggs, vegetables, rice, vegetables salads, soups, sauces Bay Leaf-Meat, fish, stews, poultry Burnet-Salad, vegetables (cucumber-like flavor) Caraway Seed-Bread,  cookies, cottage cheese, meat, vegetables, cheese, rice Cardamon-Baked goods, fruit, soups Celery Powder or seed-Salads, salad dressings, sauces, meatloaf, soup, bread.Do not use  celery salt Chervil-Meats, salads, fish, eggs, vegetables, cottage cheese (parsley-like flavor) Chili Power-Meatloaf, chicken cheese, corn, eggplant, egg dishes Chives-Salads cottage cheese, egg dishes, soups, vegetables, sauces Cilantro-Salsa, casseroles Cinnamon-Baked goods, fruit, pork, lamb, chicken, carrots Cloves-Fruit, baked goods, fish, pot roast, green beans, beets, carrots Coriander-Pastry, cookies, meat, salads, cheese (lemon-orange flavor) Cumin-Meatloaf, fish,cheese, eggs, cabbage,fruit pie (caraway flavor) [employer], fruit, eggs, fish, poultry, cottage cheese, vegetables Dill Seed-Meat, cottage cheese, poultry, vegetables, fish, salads, bread Fennel Seed-Bread, cookies, apples, pork, eggs, fish, beets, cabbage, cheese, Licorice-like flavor Garlic-(buds or powder) Salads, meat, poultry, fish, bread, butter, vegetables, potatoes.Do not  use garlic salt Ginger-Fruit, vegetables, baked goods, meat, fish, poultry Horseradish Root-Meet, vegetables, butter Lemon Juice or Extract-Vegetables, fruit, tea, baked goods, fish salads Mace-Baked goods fruit, vegetables, fish, poultry (taste like nutmeg) Maple Extract-Syrups Marjoram-Meat, chicken, fish, vegetables, breads, green salads (taste like Sage) Mint-Tea, lamb,  sherbet, vegetables, desserts, carrots, cabbage Mustard, Dry or Seed-Cheese, eggs, meats, vegetables, poultry Nutmeg-Baked goods, fruit, chicken, eggs, vegetables, desserts Onion Powder-Meat, fish, poultry, vegetables, cheese, eggs, bread, rice salads (Do not use   Onion salt) Orange Extract-Desserts, baked goods Oregano-Pasta, eggs, cheese, onions, pork, lamb, fish, chicken, vegetables, green salads Paprika-Meat, fish, poultry, eggs, cheese, vegetables Parsley Flakes-Butter, vegetables,  meat fish, poultry, eggs, bread, salads (certain forms may   Contain sodium Pepper-Meat fish, poultry, vegetables, eggs Peppermint Extract-Desserts, baked goods Poppy Seed-Eggs, bread, cheese, fruit dressings, baked goods, noodles, vegetables, cottage  [employer], poultry, meat, fish, cauliflower, turnips,eggs bread Saffron-Rice, bread, veal, chicken, fish, eggs Sage-Meat, fish, poultry, onions, eggplant, tomateos, pork, stews Savory-Eggs, salads, poultry, meat, rice, vegetables, soups, pork Tarragon-Meat, poultry, fish, eggs, butter, vegetables (licorice-like flavor)  Thyme-Meat, poultry, fish, eggs, vegetables, (clover-like flavor), sauces, soups Tumeric-Salads, butter, eggs, fish, rice, vegetables (saffron-like flavor) Vanilla Extract-Baked goods, candy Vinegar-Salads, vegetables, meat marinades Walnut Extract-baked goods, candy  2. Choose your Foods Wisely   The following is a list of foods to avoid which are high in sodium:  Meats-Avoid all smoked, canned, salt cured, dried and kosher meat and fish as well as Anchovies   [employer] meats:Bologna, Liverwurst, Pastrami Canned meat or fish  Marinated herring Caviar    Pepperoni Corned Beef   Pizza Dried chipped beef  Salami Frozen breaded fish or meat Salt pork Frankfurters or hot dogs  Sardines Gefilte fish   Sausage Ham (boiled ham, Proscuitto Smoked butt    spiced ham)   Spam      TV Dinners Vegetables Canned vegetables (Regular) Relish Canned mushrooms  Sauerkraut Olives    Tomato juice Pickles  Bakery and Dessert Products Canned puddings  Cream pies Cheesecake   Decorated cakes Cookies  Beverages/Juices Tomato juice, regular  Gatorade   V-8 vegetable juice, regular  Breads and Cereals Biscuit mixes   Salted potato chips, corn chips, pretzels Bread stuffing mixes  Salted crackers and rolls Pancake and waffle mixes Self-rising  flour  Seasonings Accent    Meat sauces Barbecue sauce  Meat tenderizer Catsup    Monosodium glutamate (MSG) Celery salt   Onion salt Chili sauce   Prepared mustard Garlic salt   Salt, seasoned salt, sea salt Gravy mixes   Soy sauce Horseradish   Steak sauce Ketchup   Tartar sauce Lite salt    Teriyaki sauce Marinade mixes   Worcestershire sauce  Others Baking powder   Cocoa and cocoa mixes Baking soda   Commercial casserole mixes Candy-caramels, chocolate  Dehydrated soups    Bars, fudge,nougats  Instant rice and pasta mixes Canned broth or soup  Maraschino cherries Cheese, aged and processed cheese and cheese spreads  Learning Assessment Quiz  Indicated T (for True) or F (for False) for each of the following statements:  1. _____ Fresh fruits and vegetables and unprocessed grains are generally low in sodium 2. _____ Water may contain a considerable amount of sodium, depending on the source 3. _____ You can always tell if a food is high in sodium by tasting it 4. _____ Certain laxatives my be high in sodium and should be avoided unless prescribed   by a physician or pharmacist 5. _____ Salt substitutes may be used freely by anyone on a sodium restricted diet 6. _____ Sodium is present in table salt, food additives and as a natural component of   most foods 7. _____ Table salt is approximately 90% sodium 8. _____  Limiting sodium intake may help prevent excess fluid accumulation in the body 9. _____ On a sodium-restricted diet, seasonings such as bouillon soy sauce, and    cooking wine should be used in place of table salt 10. _____ On an ingredient list, a product which lists monosodium glutamate as the first   ingredient is an appropriate food to include on a low sodium diet  Circle the best answer(s) to the following statements (Hint: there may be more than one correct answer)  11. On a low-sodium diet, some acceptable snack items are:    A. Olives  F. Bean dip   K.  Grapefruit juice    B. Salted Pretzels G. Commercial Popcorn   L. Canned peaches    C. Carrot Sticks  H. Bouillon   M. Unsalted nuts   D. [place] fries  I. Peanut butter crackers N. Salami   E. Sweet pickles J. Tomato Juice   O. Pizza  12.  Seasonings that may be used freely on a reduced - sodium diet include   A. Lemon wedges F.Monosodium glutamate K. Celery seed    B.Soysauce   G. Pepper   L. Mustard powder   C. Sea salt  H. Cooking wine  M. Onion flakes   D. Vinegar  E. Prepared horseradish N. Salsa   E. Sage   J. Worcestershire sauce  O. Chutney     If you need a refill on your cardiac medications before your next appointment, please call your pharmacy.       Sumner Boast, PA-C  01/11/2018 3:32 PM    [facility] [address], [place], [place]  [address] Phone: [phone]; Fax: [phone]

## 2018-01-11 NOTE — Progress Notes (Signed)
Pt's Oxycontin was approved from 12/26/17 to 12/27/18.

## 2018-01-11 NOTE — Progress Notes (Signed)
Per letter from [facility], clinical prior Charles Coffey is not required for Oxycodone.  Letter filed with HIM.

## 2018-01-11 NOTE — Patient Instructions (Signed)
Medication Instructions:  Your physician has recommended you make the following change in your medication:  1. Check with Oncologist about taking Asprin 2. Increase Lasix  two tablets (80 mg ) for two days than decrease back to (40 mg) daily.  Can take extra tablet as needed for SOB or 2-3 lbs weight gain in 24 hours.    Labwork: Please get a BMET/BNP with home health today, patients received script today.  Testing/Procedures: -None  Follow-Up: Your physician recommends that you keep your scheduled follow-up appointment with Estella Husk, PA and Dr. Saunders Revel.   Any Other Special Instructions Will Be Listed Below (If Applicable).   Please get compression hose, script given to patient today.     Two Gram Sodium Diet 2000 mg  What is Sodium? Sodium is a mineral found naturally in many foods. The most significant source of sodium in the diet is table salt, which is about 40% sodium.  Processed, convenience, and preserved foods also contain a large amount of sodium.  The body needs only 500 mg of sodium daily to function,  A normal diet provides more than enough sodium even if you do not use salt.  Why Limit Sodium? A build up of sodium in the body can cause thirst, increased blood pressure, shortness of breath, and water retention.  Decreasing sodium in the diet can reduce edema and risk of heart attack or stroke associated with high blood pressure.  Keep in mind that there are many other factors involved in these health problems.  Heredity, obesity, lack of exercise, cigarette smoking, stress and what you eat all play a role.  General Guidelines:  Do not add salt at the table or in cooking.  One teaspoon of salt contains over 2 grams of sodium.  Read food labels  Avoid processed and convenience foods  Ask your dietitian before eating any foods not dicussed in the menu planning guidelines  Consult your physician if you wish to use a salt substitute or a sodium containing medication  such as antacids.  Limit milk and milk products to 16 oz (2 cups) per day.  Shopping Hints:  READ LABELS!! "Dietetic" does not necessarily mean low sodium.  Salt and other sodium ingredients are often added to foods during processing.   Menu Planning Guidelines Food Group Choose More Often Avoid  Beverages (see also the milk group All fruit juices, low-sodium, salt-free vegetables juices, low-sodium carbonated beverages Regular vegetable or tomato juices, commercially softened water used for drinking or cooking  Breads and Cereals Enriched white, wheat, rye and pumpernickel bread, hard rolls and dinner rolls; muffins, cornbread and waffles; most dry cereals, cooked cereal without added salt; unsalted crackers and breadsticks; low sodium or homemade bread crumbs Bread, rolls and crackers with salted tops; quick breads; instant hot cereals; pancakes; commercial bread stuffing; self-rising flower and biscuit mixes; regular bread crumbs or cracker crumbs  Desserts and Sweets Desserts and sweets mad with mild should be within allowance Instant pudding mixes and cake mixes  Fats Butter or margarine; vegetable oils; unsalted salad dressings, regular salad dressings limited to 1 Tbs; light, sour and heavy cream Regular salad dressings containing bacon fat, bacon bits, and salt pork; snack dips made with instant soup mixes or processed cheese; salted nuts  Fruits Most fresh, frozen and canned fruits Fruits processed with salt or sodium-containing ingredient (some dried fruits are processed with sodium sulfites        Vegetables Fresh, frozen vegetables and low- sodium canned vegetables Regular canned  vegetables, sauerkraut, pickled vegetables, and others prepared in brine; frozen vegetables in sauces; vegetables seasoned with ham, bacon or salt pork  Condiments, Sauces, Miscellaneous  Salt substitute with physician's approval; pepper, herbs, spices; vinegar, lemon or lime juice; hot pepper sauce;  garlic powder, onion powder, low sodium soy sauce (1 Tbs.); low sodium condiments (ketchup, chili sauce, mustard) in limited amounts (1 tsp.) fresh ground horseradish; unsalted tortilla chips, pretzels, potato chips, popcorn, salsa (1/4 cup) Any seasoning made with salt including garlic salt, celery salt, onion salt, and seasoned salt; sea salt, rock salt, kosher salt; meat tenderizers; monosodium glutamate; mustard, regular soy sauce, barbecue, sauce, chili sauce, teriyaki sauce, steak sauce, Worcestershire sauce, and most flavored vinegars; canned gravy and mixes; regular condiments; salted snack foods, olives, picles, relish, horseradish sauce, catsup   Food preparation: Try these seasonings Meats:    Pork Sage, onion Serve with applesauce  Chicken Poultry seasoning, thyme, parsley Serve with cranberry sauce  Lamb Curry powder, rosemary, garlic, thyme Serve with mint sauce or jelly  Veal Marjoram, basil Serve with current jelly, cranberry sauce  Beef Pepper, bay leaf Serve with dry mustard, unsalted chive butter  Fish Bay leaf, dill Serve with unsalted lemon butter, unsalted parsley butter  Vegetables:    Asparagus Lemon juice   Broccoli Lemon juice   Carrots Mustard dressing parsley, mint, nutmeg, glazed with unsalted butter and sugar   Green beans Marjoram, lemon juice, nutmeg,dill seed   Tomatoes Basil, marjoram, onion   Spice /blend for [employer]" 4 tsp ground thyme 1 tsp ground sage 3 tsp ground rosemary 4 tsp ground marjoram   Test your knowledge 1. A product that says "Salt Free" may still contain sodium. True or False 2. Garlic Powder and Hot Pepper Sauce an be used as alternative seasonings.True or False 3. Processed foods have more sodium than fresh foods.  True or False 4. Canned Vegetables have less sodium than froze True or False  WAYS TO DECREASE YOUR SODIUM INTAKE 1. Avoid the use of added salt in cooking and at the table.  Table salt (and other prepared seasonings  which contain salt) is probably one of the greatest sources of sodium in the diet.  Unsalted foods can gain flavor from the sweet, sour, and butter taste sensations of herbs and spices.  Instead of using salt for seasoning, try the following seasonings with the foods listed.  Remember: how you use them to enhance natural food flavors is limited only by your creativity... Allspice-Meat, fish, eggs, fruit, peas, red and yellow vegetables Almond Extract-Fruit baked goods Anise Seed-Sweet breads, fruit, carrots, beets, cottage cheese, cookies (tastes like licorice) Basil-Meat, fish, eggs, vegetables, rice, vegetables salads, soups, sauces Bay Leaf-Meat, fish, stews, poultry Burnet-Salad, vegetables (cucumber-like flavor) Caraway Seed-Bread, cookies, cottage cheese, meat, vegetables, cheese, rice Cardamon-Baked goods, fruit, soups Celery Powder or seed-Salads, salad dressings, sauces, meatloaf, soup, bread.Do not use  celery salt Chervil-Meats, salads, fish, eggs, vegetables, cottage cheese (parsley-like flavor) Chili Power-Meatloaf, chicken cheese, corn, eggplant, egg dishes Chives-Salads cottage cheese, egg dishes, soups, vegetables, sauces Cilantro-Salsa, casseroles Cinnamon-Baked goods, fruit, pork, lamb, chicken, carrots Cloves-Fruit, baked goods, fish, pot roast, green beans, beets, carrots Coriander-Pastry, cookies, meat, salads, cheese (lemon-orange flavor) Cumin-Meatloaf, fish,cheese, eggs, cabbage,fruit pie (caraway flavor) [employer], fruit, eggs, fish, poultry, cottage cheese, vegetables Dill Seed-Meat, cottage cheese, poultry, vegetables, fish, salads, bread Fennel Seed-Bread, cookies, apples, pork, eggs, fish, beets, cabbage, cheese, Licorice-like flavor Garlic-(buds or powder) Salads, meat, poultry, fish, bread, butter, vegetables, potatoes.Do not  use  garlic salt Ginger-Fruit, vegetables, baked goods, meat, fish, poultry Horseradish Root-Meet, vegetables, butter Lemon Juice  or Extract-Vegetables, fruit, tea, baked goods, fish salads Mace-Baked goods fruit, vegetables, fish, poultry (taste like nutmeg) Maple Extract-Syrups Marjoram-Meat, chicken, fish, vegetables, breads, green salads (taste like Sage) Mint-Tea, lamb, sherbet, vegetables, desserts, carrots, cabbage Mustard, Dry or Seed-Cheese, eggs, meats, vegetables, poultry Nutmeg-Baked goods, fruit, chicken, eggs, vegetables, desserts Onion Powder-Meat, fish, poultry, vegetables, cheese, eggs, bread, rice salads (Do not use   Onion salt) Orange Extract-Desserts, baked goods Oregano-Pasta, eggs, cheese, onions, pork, lamb, fish, chicken, vegetables, green salads Paprika-Meat, fish, poultry, eggs, cheese, vegetables Parsley Flakes-Butter, vegetables, meat fish, poultry, eggs, bread, salads (certain forms may   Contain sodium Pepper-Meat fish, poultry, vegetables, eggs Peppermint Extract-Desserts, baked goods Poppy Seed-Eggs, bread, cheese, fruit dressings, baked goods, noodles, vegetables, cottage  [employer], poultry, meat, fish, cauliflower, turnips,eggs bread Saffron-Rice, bread, veal, chicken, fish, eggs Sage-Meat, fish, poultry, onions, eggplant, tomateos, pork, stews Savory-Eggs, salads, poultry, meat, rice, vegetables, soups, pork Tarragon-Meat, poultry, fish, eggs, butter, vegetables (licorice-like flavor)  Thyme-Meat, poultry, fish, eggs, vegetables, (clover-like flavor), sauces, soups Tumeric-Salads, butter, eggs, fish, rice, vegetables (saffron-like flavor) Vanilla Extract-Baked goods, candy Vinegar-Salads, vegetables, meat marinades Walnut Extract-baked goods, candy  2. Choose your Foods Wisely   The following is a list of foods to avoid which are high in sodium:  Meats-Avoid all smoked, canned, salt cured, dried and kosher meat and fish as well as Anchovies   [employer] meats:Bologna, Liverwurst, Pastrami Canned meat or fish  Marinated  herring Caviar    Pepperoni Corned Beef   Pizza Dried chipped beef  Salami Frozen breaded fish or meat Salt pork Frankfurters or hot dogs  Sardines Gefilte fish   Sausage Ham (boiled ham, Proscuitto Smoked butt    spiced ham)   Spam      TV Dinners Vegetables Canned vegetables (Regular) Relish Canned mushrooms  Sauerkraut Olives    Tomato juice Pickles  Bakery and Dessert Products Canned puddings  Cream pies Cheesecake   Decorated cakes Cookies  Beverages/Juices Tomato juice, regular  Gatorade   V-8 vegetable juice, regular  Breads and Cereals Biscuit mixes   Salted potato chips, corn chips, pretzels Bread stuffing mixes  Salted crackers and rolls Pancake and waffle mixes Self-rising flour  Seasonings Accent    Meat sauces Barbecue sauce  Meat tenderizer Catsup    Monosodium glutamate (MSG) Celery salt   Onion salt Chili sauce   Prepared mustard Garlic salt   Salt, seasoned salt, sea salt Gravy mixes   Soy sauce Horseradish   Steak sauce Ketchup   Tartar sauce Lite salt    Teriyaki sauce Marinade mixes   Worcestershire sauce  Others Baking powder   Cocoa and cocoa mixes Baking soda   Commercial casserole mixes Candy-caramels, chocolate  Dehydrated soups    Bars, fudge,nougats  Instant rice and pasta mixes Canned broth or soup  Maraschino cherries Cheese, aged and processed cheese and cheese spreads  Learning Assessment Quiz  Indicated T (for True) or F (for False) for each of the following statements:  1. _____ Fresh fruits and vegetables and unprocessed grains are generally low in sodium 2. _____ Water may contain a considerable amount of sodium, depending on the source 3. _____ You can always tell if a food is high in sodium by tasting it 4. _____ Certain laxatives my be high in sodium and should be avoided unless prescribed   by a physician or  pharmacist 5. _____ Salt substitutes may be used freely by anyone on a sodium restricted diet 6. _____ Sodium is  present in table salt, food additives and as a natural component of   most foods 7. _____ Table salt is approximately 90% sodium 8. _____ Limiting sodium intake may help prevent excess fluid accumulation in the body 9. _____ On a sodium-restricted diet, seasonings such as bouillon soy sauce, and    cooking wine should be used in place of table salt 10. _____ On an ingredient list, a product which lists monosodium glutamate as the first   ingredient is an appropriate food to include on a low sodium diet  Circle the best answer(s) to the following statements (Hint: there may be more than one correct answer)  11. On a low-sodium diet, some acceptable snack items are:    A. Olives  F. Bean dip   K. Grapefruit juice    B. Salted Pretzels G. Commercial Popcorn   L. Canned peaches    C. Carrot Sticks  H. Bouillon   M. Unsalted nuts   D. [place] fries  I. Peanut butter crackers N. Salami   E. Sweet pickles J. Tomato Juice   O. Pizza  12.  Seasonings that may be used freely on a reduced - sodium diet include   A. Lemon wedges F.Monosodium glutamate K. Celery seed    B.Soysauce   G. Pepper   L. Mustard powder   C. Sea salt  H. Cooking wine  M. Onion flakes   D. Vinegar  E. Prepared horseradish N. Salsa   E. Sage   J. Worcestershire sauce  O. Chutney     If you need a refill on your cardiac medications before your next appointment, please call your pharmacy.

## 2018-01-12 ENCOUNTER — Telehealth: Payer: Self-pay | Admitting: Physician Assistant

## 2018-01-12 DIAGNOSIS — R3915 Urgency of urination: Secondary | ICD-10-CM | POA: Diagnosis not present

## 2018-01-12 DIAGNOSIS — R3914 Feeling of incomplete bladder emptying: Secondary | ICD-10-CM | POA: Diagnosis not present

## 2018-01-12 NOTE — Telephone Encounter (Signed)
Mrs.Archambeau is calling because Mr.Herrig is supposed to get compression socks for 30-40 and she is having trouble finding them and she is wanting to know can he get 20-30  Please call

## 2018-01-12 NOTE — Telephone Encounter (Signed)
Called and spoke with Charles Coffey gave her the OK to draw labs she stated that the labs were drawn.

## 2018-01-12 NOTE — Telephone Encounter (Signed)
Yes its okay to draw the labs

## 2018-01-12 NOTE — Telephone Encounter (Signed)
Spoke with patient's wife concerning her husband's compression stockings.    I informed her that [employer] in  should carry that size (30-40) because she could not find them in this area.   She was very thankful.

## 2018-01-12 NOTE — Telephone Encounter (Signed)
Called Patty's number twice and unable to leave a VM due to MB being full.   Will call back later

## 2018-01-13 ENCOUNTER — Encounter: Payer: Self-pay | Admitting: *Deleted

## 2018-01-13 DIAGNOSIS — R3915 Urgency of urination: Secondary | ICD-10-CM | POA: Diagnosis not present

## 2018-01-13 DIAGNOSIS — C670 Malignant neoplasm of trigone of bladder: Secondary | ICD-10-CM | POA: Diagnosis not present

## 2018-01-13 NOTE — Telephone Encounter (Signed)
If she can't find that size she can get the 20-30 compression stockings.

## 2018-01-13 NOTE — Telephone Encounter (Signed)
Spoke with patient's wife and informed her it was okay for now to get the 20-30 compression stockings.  She verbalized an understanding.

## 2018-01-14 ENCOUNTER — Encounter: Payer: Self-pay | Admitting: Family Medicine

## 2018-01-14 ENCOUNTER — Ambulatory Visit (INDEPENDENT_AMBULATORY_CARE_PROVIDER_SITE_OTHER): Payer: Medicare HMO | Admitting: Family Medicine

## 2018-01-14 ENCOUNTER — Encounter: Payer: Self-pay | Admitting: Internal Medicine

## 2018-01-14 VITALS — BP 120/64 | Temp 98.3°F | Wt 181.2 lb

## 2018-01-14 DIAGNOSIS — A419 Sepsis, unspecified organism: Secondary | ICD-10-CM

## 2018-01-14 DIAGNOSIS — D649 Anemia, unspecified: Secondary | ICD-10-CM | POA: Diagnosis not present

## 2018-01-14 DIAGNOSIS — C679 Malignant neoplasm of bladder, unspecified: Secondary | ICD-10-CM | POA: Diagnosis not present

## 2018-01-14 DIAGNOSIS — N183 Chronic kidney disease, stage 3 unspecified: Secondary | ICD-10-CM

## 2018-01-14 DIAGNOSIS — F5101 Primary insomnia: Secondary | ICD-10-CM | POA: Diagnosis not present

## 2018-01-14 DIAGNOSIS — D696 Thrombocytopenia, unspecified: Secondary | ICD-10-CM

## 2018-01-14 DIAGNOSIS — N390 Urinary tract infection, site not specified: Principal | ICD-10-CM

## 2018-01-14 DIAGNOSIS — R69 Illness, unspecified: Secondary | ICD-10-CM | POA: Diagnosis not present

## 2018-01-14 DIAGNOSIS — I10 Essential (primary) hypertension: Secondary | ICD-10-CM

## 2018-01-14 DIAGNOSIS — I252 Old myocardial infarction: Secondary | ICD-10-CM

## 2018-01-14 MED ORDER — TRAZODONE HCL 50 MG PO TABS: 50.0000 mg | ORAL_TABLET | Freq: Every evening | ORAL | 2 refills | Status: DC | PRN

## 2018-01-14 NOTE — Progress Notes (Signed)
   Subjective:    Patient ID: Charles Coffey, male    DOB: March 02, 1935, 83 y.o.   MRN: [id number]  HPI Here to follow up a hospital stay from 01-01-18 to 01-04-18 for pyelonephritis and sepsis. He was found to have Citrobacter in his urine and was treated with IV antibiotics. 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 He was given Ceftin and Bactrim to take for another week and he will finish these tomorrow. He has seen the Urology office several times since then and they removed his foley catheter. He still has both nephrostomies in place. While in the hospital he was found to be quite anemic and hew was transfused several units of blood. The last CBC I can see was on 01-06-18 and his WBC was 2.7, HGb was 8.5, and platelets were 39. He sees Oncology frequently to manage the chemotherapy for his invasive urothelial cancer. While in the hospital he had an elevated troponin level and he was felt to have had a mild non-ST elevation infarction. Today he feels well except for generalized fatigue. His appetite is good. He had a lot of LE edema when he got home and his Lasix was increased to 80 mg a day for a week or so. This was successful so the dose was lowered to 40 mg daily again. He has ordered some compression stockings.    Review of Systems  Constitutional: Positive for fatigue. Negative for fever.  Respiratory: Negative.   Cardiovascular: Negative.   Gastrointestinal: Negative.   Neurological: Negative.        Objective:   Physical Exam  Constitutional: He is oriented to person, place, and time. He appears well-developed and well-nourished. No distress.  Neck: No thyromegaly present.  Cardiovascular: Normal rate, regular rhythm, normal heart sounds and intact distal pulses.  Pulmonary/Chest: Effort normal and breath sounds normal. No respiratory distress. He has no wheezes. He has no rales.  Abdominal: Soft. Bowel sounds are normal. He exhibits no distension and no mass.  There is no tenderness. There is no rebound and no guarding.  Musculoskeletal:  Trace edema in the lower legs   Lymphadenopathy:    He has no cervical adenopathy.  Neurological: He is alert and oriented to person, place, and time.          Assessment & Plan:  He is doing fairly well in general. He will continue with Dr. [name] for the urothelial carcinoma. They will watch his cell counts and anemia closely. 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 His LE edema is well managed. His pyelonephritis has resolved. He will finish up the antibiotics tomorrow. He does ask about help with sleep. He tried Temazepam but it was too sedating in the daytime. He will try Trazodone 50 mg qhs.  Alysia Penna, MD

## 2018-01-17 DIAGNOSIS — K624 Stenosis of anus and rectum: Secondary | ICD-10-CM | POA: Diagnosis not present

## 2018-01-17 DIAGNOSIS — Z933 Colostomy status: Secondary | ICD-10-CM | POA: Diagnosis not present

## 2018-01-19 DIAGNOSIS — K589 Irritable bowel syndrome without diarrhea: Secondary | ICD-10-CM | POA: Diagnosis not present

## 2018-01-19 DIAGNOSIS — K624 Stenosis of anus and rectum: Secondary | ICD-10-CM | POA: Diagnosis not present

## 2018-01-19 DIAGNOSIS — Z436 Encounter for attention to other artificial openings of urinary tract: Secondary | ICD-10-CM | POA: Diagnosis not present

## 2018-01-19 DIAGNOSIS — K5904 Chronic idiopathic constipation: Secondary | ICD-10-CM | POA: Diagnosis not present

## 2018-01-19 DIAGNOSIS — I252 Old myocardial infarction: Secondary | ICD-10-CM | POA: Diagnosis not present

## 2018-01-19 DIAGNOSIS — I2510 Atherosclerotic heart disease of native coronary artery without angina pectoris: Secondary | ICD-10-CM | POA: Diagnosis not present

## 2018-01-19 DIAGNOSIS — Z433 Encounter for attention to colostomy: Secondary | ICD-10-CM | POA: Diagnosis not present

## 2018-01-19 DIAGNOSIS — I255 Ischemic cardiomyopathy: Secondary | ICD-10-CM | POA: Diagnosis not present

## 2018-01-19 DIAGNOSIS — D126 Benign neoplasm of colon, unspecified: Secondary | ICD-10-CM | POA: Diagnosis not present

## 2018-01-19 DIAGNOSIS — C679 Malignant neoplasm of bladder, unspecified: Secondary | ICD-10-CM | POA: Diagnosis not present

## 2018-01-20 ENCOUNTER — Inpatient Hospital Stay: Payer: Medicare HMO

## 2018-01-20 ENCOUNTER — Telehealth: Payer: Self-pay | Admitting: Oncology

## 2018-01-20 ENCOUNTER — Inpatient Hospital Stay: Payer: Medicare HMO | Admitting: Oncology

## 2018-01-20 VITALS — BP 117/68 | HR 73 | Temp 97.5°F | Resp 18 | Ht 68.0 in | Wt 184.5 lb

## 2018-01-20 DIAGNOSIS — C679 Malignant neoplasm of bladder, unspecified: Secondary | ICD-10-CM | POA: Diagnosis not present

## 2018-01-20 DIAGNOSIS — D696 Thrombocytopenia, unspecified: Secondary | ICD-10-CM | POA: Diagnosis not present

## 2018-01-20 DIAGNOSIS — D630 Anemia in neoplastic disease: Secondary | ICD-10-CM

## 2018-01-20 DIAGNOSIS — R609 Edema, unspecified: Secondary | ICD-10-CM | POA: Diagnosis not present

## 2018-01-20 DIAGNOSIS — I82409 Acute embolism and thrombosis of unspecified deep veins of unspecified lower extremity: Secondary | ICD-10-CM | POA: Diagnosis not present

## 2018-01-20 DIAGNOSIS — C785 Secondary malignant neoplasm of large intestine and rectum: Secondary | ICD-10-CM

## 2018-01-20 DIAGNOSIS — N1330 Unspecified hydronephrosis: Secondary | ICD-10-CM

## 2018-01-20 DIAGNOSIS — D649 Anemia, unspecified: Secondary | ICD-10-CM

## 2018-01-20 DIAGNOSIS — R52 Pain, unspecified: Secondary | ICD-10-CM

## 2018-01-20 DIAGNOSIS — D6959 Other secondary thrombocytopenia: Secondary | ICD-10-CM | POA: Diagnosis not present

## 2018-01-20 DIAGNOSIS — C786 Secondary malignant neoplasm of retroperitoneum and peritoneum: Secondary | ICD-10-CM | POA: Diagnosis not present

## 2018-01-20 DIAGNOSIS — C7951 Secondary malignant neoplasm of bone: Secondary | ICD-10-CM

## 2018-01-20 DIAGNOSIS — Z5111 Encounter for antineoplastic chemotherapy: Secondary | ICD-10-CM | POA: Diagnosis not present

## 2018-01-20 DIAGNOSIS — I219 Acute myocardial infarction, unspecified: Secondary | ICD-10-CM | POA: Diagnosis not present

## 2018-01-20 DIAGNOSIS — Z5189 Encounter for other specified aftercare: Secondary | ICD-10-CM | POA: Diagnosis not present

## 2018-01-20 DIAGNOSIS — D6481 Anemia due to antineoplastic chemotherapy: Secondary | ICD-10-CM | POA: Diagnosis not present

## 2018-01-20 DIAGNOSIS — Z95828 Presence of other vascular implants and grafts: Secondary | ICD-10-CM

## 2018-01-20 LAB — CMP (CANCER CENTER ONLY)
ALK PHOS: 75 U/L (ref 40–150)
ALT: 13 U/L (ref 0–55)
ANION GAP: 9 (ref 3–11)
AST: 17 U/L (ref 5–34)
Albumin: 3.7 g/dL (ref 3.5–5.0)
BUN: 30 mg/dL — ABNORMAL HIGH (ref 7–26)
CALCIUM: 9.5 mg/dL (ref 8.4–10.4)
CO2: 20 mmol/L — ABNORMAL LOW (ref 22–29)
CREATININE: 1.67 mg/dL — AB (ref 0.70–1.30)
Chloride: 104 mmol/L (ref 98–109)
GFR, EST AFRICAN AMERICAN: 43 mL/min — AB (ref >60)
GFR, EST NON AFRICAN AMERICAN: 37 mL/min — AB (ref >60)
Glucose, Bld: 100 mg/dL (ref 70–140)
Potassium: 4.3 mmol/L (ref 3.5–5.1)
Sodium: 133 mmol/L — ABNORMAL LOW (ref 136–145)
Total Bilirubin: 0.4 mg/dL (ref 0.2–1.2)
Total Protein: 7.1 g/dL (ref 6.4–8.3)

## 2018-01-20 LAB — CBC WITH DIFFERENTIAL (CANCER CENTER ONLY)
BASOS ABS: 0 10*3/uL (ref 0.0–0.1)
BASOS PCT: 0 %
EOS ABS: 0.1 10*3/uL (ref 0.0–0.5)
Eosinophils Relative: 2 %
HEMATOCRIT: 24.3 % — AB (ref 38.4–49.9)
HEMOGLOBIN: 8 g/dL — AB (ref 13.0–17.1)
Lymphocytes Relative: 37 %
Lymphs Abs: 1.1 10*3/uL (ref 0.9–3.3)
MCH: 30 pg (ref 27.2–33.4)
MCHC: 32.9 g/dL (ref 32.0–36.0)
MCV: 91 fL (ref 79.3–98.0)
MONOS PCT: 24 %
Monocytes Absolute: 0.7 10*3/uL (ref 0.1–0.9)
NEUTROS ABS: 1.1 10*3/uL — AB (ref 1.5–6.5)
NEUTROS PCT: 37 %
Platelet Count: 66 10*3/uL — ABNORMAL LOW (ref 140–400)
RBC: 2.67 MIL/uL — ABNORMAL LOW (ref 4.20–5.82)
RDW: 17.2 % — AB (ref 11.0–15.6)
WBC Count: 3 10*3/uL — ABNORMAL LOW (ref 4.0–10.3)

## 2018-01-20 MED ORDER — SODIUM CHLORIDE 0.9 % IV SOLN
800.0000 mg/m2 | Freq: Once | INTRAVENOUS | Status: AC
  Administered 2018-01-20: 1634 mg via INTRAVENOUS
  Filled 2018-01-20: qty 43

## 2018-01-20 MED ORDER — SODIUM CHLORIDE 0.9% FLUSH
10.0000 mL | INTRAVENOUS | Status: DC | PRN
  Administered 2018-01-20: 10 mL
  Filled 2018-01-20: qty 10

## 2018-01-20 MED ORDER — HEPARIN SOD (PORK) LOCK FLUSH 100 UNIT/ML IV SOLN
500.0000 [IU] | Freq: Once | INTRAVENOUS | Status: AC | PRN
  Administered 2018-01-20: 500 [IU]
  Filled 2018-01-20: qty 5

## 2018-01-20 MED ORDER — HEPARIN SOD (PORK) LOCK FLUSH 100 UNIT/ML IV SOLN
500.0000 [IU] | Freq: Once | INTRAVENOUS | Status: DC | PRN
  Filled 2018-01-20: qty 5

## 2018-01-20 MED ORDER — GEMCITABINE HCL CHEMO INJECTION 1 GM/26.3ML: 1000.0000 mg/m2 | Freq: Once | INTRAVENOUS | Status: DC

## 2018-01-20 MED ORDER — SODIUM CHLORIDE 0.9 % IV SOLN
330.0000 mg | Freq: Once | INTRAVENOUS | Status: AC
  Administered 2018-01-20: 330 mg via INTRAVENOUS
  Filled 2018-01-20: qty 33

## 2018-01-20 MED ORDER — SODIUM CHLORIDE 0.9 % IV SOLN
Freq: Once | INTRAVENOUS | Status: DC
Start: 2018-01-20 — End: 2018-01-20

## 2018-01-20 MED ORDER — PALONOSETRON HCL INJECTION 0.25 MG/5ML
0.2500 mg | Freq: Once | INTRAVENOUS | Status: AC
  Administered 2018-01-20: 0.25 mg via INTRAVENOUS

## 2018-01-20 MED ORDER — PROCHLORPERAZINE MALEATE 10 MG PO TABS: 10.0000 mg | ORAL_TABLET | Freq: Once | ORAL | Status: DC

## 2018-01-20 MED ORDER — SODIUM CHLORIDE 0.9 % IV SOLN
Freq: Once | INTRAVENOUS | Status: AC
  Administered 2018-01-20: 11:00:00 via INTRAVENOUS

## 2018-01-20 MED ORDER — SODIUM CHLORIDE 0.9 % IV SOLN
Freq: Once | INTRAVENOUS | Status: AC
  Administered 2018-01-20: 11:00:00 via INTRAVENOUS
  Filled 2018-01-20: qty 5

## 2018-01-20 MED ORDER — SODIUM CHLORIDE 0.9% FLUSH
10.0000 mL | INTRAVENOUS | Status: DC | PRN
  Filled 2018-01-20: qty 10

## 2018-01-20 MED ORDER — SODIUM CHLORIDE 0.9% FLUSH
10.0000 mL | Freq: Once | INTRAVENOUS | Status: AC
  Administered 2018-01-20: 10 mL via INTRAVENOUS
  Filled 2018-01-20: qty 10

## 2018-01-20 NOTE — Progress Notes (Signed)
Confirmed w/ MD - today is cycle 2 day 1. No more day 8 so MD agreed to d/c all day 8 txs. Pt will also need Neulasta > 24 hrs after chemo.  I've notified inf RN and sched to move inj appt to 01/22/18. Kennith Center, Pharm.D., CPP 01/20/2018@11 :00 AM

## 2018-01-20 NOTE — Patient Instructions (Signed)
Implanted Port Home Guide An implanted port is a type of central line that is placed under the skin. Central lines are used to provide IV access when treatment or nutrition needs to be given through a person's veins. Implanted ports are used for long-term IV access. An implanted port may be placed because:  You need IV medicine that would be irritating to the small veins in your hands or arms.  You need long-term IV medicines, such as antibiotics.  You need IV nutrition for a long period.  You need frequent blood draws for lab tests.  You need dialysis.  Implanted ports are usually placed in the chest area, but they can also be placed in the upper arm, the abdomen, or the leg. An implanted port has two main parts:  Reservoir. The reservoir is round and will appear as a small, raised area under your skin. The reservoir is the part where a needle is inserted to give medicines or draw blood.  Catheter. The catheter is a thin, flexible tube that extends from the reservoir. The catheter is placed into a large vein. Medicine that is inserted into the reservoir goes into the catheter and then into the vein.  How will I care for my incision site? Do not get the incision site wet. Bathe or shower as directed by your health care provider. How is my port accessed? Special steps must be taken to access the port:  Before the port is accessed, a numbing cream can be placed on the skin. This helps numb the skin over the port site.  Your health care provider uses a sterile technique to access the port. ? Your health care provider must put on a mask and sterile gloves. ? The skin over your port is cleaned carefully with an antiseptic and allowed to dry. ? The port is gently pinched between sterile gloves, and a needle is inserted into the port.  Only "non-coring" port needles should be used to access the port. Once the port is accessed, a blood return should be checked. This helps ensure that the port  is in the vein and is not clogged.  If your port needs to remain accessed for a constant infusion, a clear (transparent) bandage will be placed over the needle site. The bandage and needle will need to be changed every week, or as directed by your health care provider.  Keep the bandage covering the needle clean and dry. Do not get it wet. Follow your health care provider's instructions on how to take a shower or bath while the port is accessed.  If your port does not need to stay accessed, no bandage is needed over the port.  What is flushing? Flushing helps keep the port from getting clogged. Follow your health care provider's instructions on how and when to flush the port. Ports are usually flushed with saline solution or a medicine called heparin. The need for flushing will depend on how the port is used.  If the port is used for intermittent medicines or blood draws, the port will need to be flushed: ? After medicines have been given. ? After blood has been drawn. ? As part of routine maintenance.  If a constant infusion is running, the port may not need to be flushed.  How long will my port stay implanted? The port can stay in for as long as your health care provider thinks it is needed. When it is time for the port to come out, surgery will be   done to remove it. The procedure is similar to the one performed when the port was put in. When should I seek immediate medical care? When you have an implanted port, you should seek immediate medical care if:  You notice a bad smell coming from the incision site.  You have swelling, redness, or drainage at the incision site.  You have more swelling or pain at the port site or the surrounding area.  You have a fever that is not controlled with medicine.  This information is not intended to replace advice given to you by your health care provider. Make sure you discuss any questions you have with your health care provider. Document  Released: 12/14/2005 Document Revised: 05/21/2016 Document Reviewed: 08/21/2013 Elsevier Interactive Patient Education  2017 [employer]  

## 2018-01-20 NOTE — Patient Instructions (Addendum)
[facility] Discharge Instructions for Patients Receiving Chemotherapy  Today you received the following chemotherapy agents Gemzar and Carboplatin.  To help prevent nausea and vomiting after your treatment, we encourage you to take your nausea medication.    If you develop nausea and vomiting that is not controlled by your nausea medication, call the clinic.   BELOW ARE SYMPTOMS THAT SHOULD BE REPORTED IMMEDIATELY:  *FEVER GREATER THAN 100.5 F  *CHILLS WITH OR WITHOUT FEVER  NAUSEA AND VOMITING THAT IS NOT CONTROLLED WITH YOUR NAUSEA MEDICATION  *UNUSUAL SHORTNESS OF BREATH  *UNUSUAL BRUISING OR BLEEDING  TENDERNESS IN MOUTH AND THROAT WITH OR WITHOUT PRESENCE OF ULCERS  *URINARY PROBLEMS  *BOWEL PROBLEMS  UNUSUAL RASH Items with * indicate a potential emergency and should be followed up as soon as possible.  Feel free to call the clinic should you have any questions or concerns. The clinic phone number is [phone].  Please show the CHEMO ALERT CARD at check-in to the Emergency Department and triage nurse.  Gemcitabine injection What is this medicine? GEMCITABINE (jem SIT a been) is a chemotherapy drug. This medicine is used to treat many types of cancer like breast cancer, lung cancer, pancreatic cancer, and ovarian cancer. This medicine may be used for other purposes; ask your health care provider or pharmacist if you have questions. COMMON BRAND NAME(S): Gemzar What should I tell my health care provider before I take this medicine? They need to know if you have any of these conditions: -blood disorders -infection -kidney disease -liver disease -recent or ongoing radiation therapy -an unusual or allergic reaction to gemcitabine, other chemotherapy, other medicines, foods, dyes, or preservatives -pregnant or trying to get pregnant -breast-feeding How should I use this medicine? This drug is given as an infusion into a vein. It is administered in  a hospital or clinic by a specially trained health care professional. Talk to your pediatrician regarding the use of this medicine in children. Special care may be needed. Overdosage: If you think you have taken too much of this medicine contact a poison control center or emergency room at once. NOTE: This medicine is only for you. Do not share this medicine with others. What if I miss a dose? It is important not to miss your dose. Call your doctor or health care professional if you are unable to keep an appointment. What may interact with this medicine? -medicines to increase blood counts like filgrastim, pegfilgrastim, sargramostim -some other chemotherapy drugs like cisplatin -vaccines Talk to your doctor or health care professional before taking any of these medicines: -acetaminophen -aspirin -ibuprofen -ketoprofen -naproxen This list may not describe all possible interactions. Give your health care provider a list of all the medicines, herbs, non-prescription drugs, or dietary supplements you use. Also tell them if you smoke, drink alcohol, or use illegal drugs. Some items may interact with your medicine. What should I watch for while using this medicine? Visit your doctor for checks on your progress. This drug may make you feel generally unwell. This is not uncommon, as chemotherapy can affect healthy cells as well as cancer cells. Report any side effects. Continue your course of treatment even though you feel ill unless your doctor tells you to stop. In some cases, you may be given additional medicines to help with side effects. Follow all directions for their use. Call your doctor or health care professional for advice if you get a fever, chills or sore throat, or other symptoms of a cold   flu. Do not treat yourself. This drug decreases your body's ability to fight infections. Try to avoid being around people who are sick. This medicine may increase your risk to bruise or bleed. Call  your doctor or health care professional if you notice any unusual bleeding. Be careful brushing and flossing your teeth or using a toothpick because you may get an infection or bleed more easily. If you have any dental work done, tell your dentist you are receiving this medicine. Avoid taking products that contain aspirin, acetaminophen, ibuprofen, naproxen, or ketoprofen unless instructed by your doctor. These medicines may hide a fever. Women should inform their doctor if they wish to become pregnant or think they might be pregnant. There is a potential for serious side effects to an unborn child. Talk to your health care professional or pharmacist for more information. Do not breast-feed an infant while taking this medicine. What side effects may I notice from receiving this medicine? Side effects that you should report to your doctor or health care professional as soon as possible: -allergic reactions like skin rash, itching or hives, swelling of the face, lips, or tongue -low blood counts - this medicine may decrease the number of white blood cells, red blood cells and platelets. You may be at increased risk for infections and bleeding. -signs of infection - fever or chills, cough, sore throat, pain or difficulty passing urine -signs of decreased platelets or bleeding - bruising, pinpoint red spots on the skin, black, tarry stools, blood in the urine -signs of decreased red blood cells - unusually weak or tired, fainting spells, lightheadedness -breathing problems -chest pain -mouth sores -nausea and vomiting -pain, swelling, redness at site where injected -pain, tingling, numbness in the hands or feet -stomach pain -swelling of ankles, feet, hands -unusual bleeding Side effects that usually do not require medical attention (report to your doctor or health care professional if they continue or are bothersome): -constipation -diarrhea -hair loss -loss of appetite -stomach upset This  list may not describe all possible side effects. Call your doctor for medical advice about side effects. You may report side effects to FDA at 1-800-FDA-1088. Where should I keep my medicine? This drug is given in a hospital or clinic and will not be stored at home. NOTE: This sheet is a summary. It may not cover all possible information. If you have questions about this medicine, talk to your doctor, pharmacist, or health care provider.  2018 Elsevier/Gold Standard (2008-04-24 18:45:54)  Carboplatin injection What is this medicine? CARBOPLATIN (KAR boe pla tin) is a chemotherapy drug. It targets fast dividing cells, like cancer cells, and causes these cells to die. This medicine is used to treat ovarian cancer and many other cancers. This medicine may be used for other purposes; ask your health care provider or pharmacist if you have questions. COMMON BRAND NAME(S): Paraplatin What should I tell my health care provider before I take this medicine? They need to know if you have any of these conditions: -blood disorders -hearing problems -kidney disease -recent or ongoing radiation therapy -an unusual or allergic reaction to carboplatin, cisplatin, other chemotherapy, other medicines, foods, dyes, or preservatives -pregnant or trying to get pregnant -breast-feeding How should I use this medicine? This drug is usually given as an infusion into a vein. It is administered in a hospital or clinic by a specially trained health care professional. Talk to your pediatrician regarding the use of this medicine in children. Special care may be needed. Overdosage: If you  think you have taken too much of this medicine contact a poison control center or emergency room at once. NOTE: This medicine is only for you. Do not share this medicine with others. What if I miss a dose? It is important not to miss a dose. Call your doctor or health care professional if you are unable to keep an appointment. What  may interact with this medicine? -medicines for seizures -medicines to increase blood counts like filgrastim, pegfilgrastim, sargramostim -some antibiotics like amikacin, gentamicin, neomycin, streptomycin, tobramycin -vaccines Talk to your doctor or health care professional before taking any of these medicines: -acetaminophen -aspirin -ibuprofen -ketoprofen -naproxen This list may not describe all possible interactions. Give your health care provider a list of all the medicines, herbs, non-prescription drugs, or dietary supplements you use. Also tell them if you smoke, drink alcohol, or use illegal drugs. Some items may interact with your medicine. What should I watch for while using this medicine? Your condition will be monitored carefully while you are receiving this medicine. You will need important blood work done while you are taking this medicine. This drug may make you feel generally unwell. This is not uncommon, as chemotherapy can affect healthy cells as well as cancer cells. Report any side effects. Continue your course of treatment even though you feel ill unless your doctor tells you to stop. In some cases, you may be given additional medicines to help with side effects. Follow all directions for their use. Call your doctor or health care professional for advice if you get a fever, chills or sore throat, or other symptoms of a cold or flu. Do not treat yourself. This drug decreases your body's ability to fight infections. Try to avoid being around people who are sick. This medicine may increase your risk to bruise or bleed. Call your doctor or health care professional if you notice any unusual bleeding. Be careful brushing and flossing your teeth or using a toothpick because you may get an infection or bleed more easily. If you have any dental work done, tell your dentist you are receiving this medicine. Avoid taking products that contain aspirin, acetaminophen, ibuprofen, naproxen,  or ketoprofen unless instructed by your doctor. These medicines may hide a fever. Do not become pregnant while taking this medicine. Women should inform their doctor if they wish to become pregnant or think they might be pregnant. There is a potential for serious side effects to an unborn child. Talk to your health care professional or pharmacist for more information. Do not breast-feed an infant while taking this medicine. What side effects may I notice from receiving this medicine? Side effects that you should report to your doctor or health care professional as soon as possible: -allergic reactions like skin rash, itching or hives, swelling of the face, lips, or tongue -signs of infection - fever or chills, cough, sore throat, pain or difficulty passing urine -signs of decreased platelets or bleeding - bruising, pinpoint red spots on the skin, black, tarry stools, nosebleeds -signs of decreased red blood cells - unusually weak or tired, fainting spells, lightheadedness -breathing problems -changes in hearing -changes in vision -chest pain -high blood pressure -low blood counts - This drug may decrease the number of white blood cells, red blood cells and platelets. You may be at increased risk for infections and bleeding. -nausea and vomiting -pain, swelling, redness or irritation at the injection site -pain, tingling, numbness in the hands or feet -problems with balance, talking, walking -trouble passing urine or  or change in the amount of urine Side effects that usually do not require medical attention (report to your doctor or health care professional if they continue or are bothersome): -hair loss -loss of appetite -metallic taste in the mouth or changes in taste This list may not describe all possible side effects. Call your doctor for medical advice about side effects. You may report side effects to FDA at 1-800-FDA-1088. Where should I keep my medicine? This drug is given in a hospital  or clinic and will not be stored at home. NOTE: This sheet is a summary. It may not cover all possible information. If you have questions about this medicine, talk to your doctor, pharmacist, or health care provider.  2018 Elsevier/Gold Standard (2008-03-20 14:38:05)  

## 2018-01-20 NOTE — Telephone Encounter (Signed)
Gave patient avs report and appointments for January thru March. Patient will have transfusion at [facility] clinic at 10am on 1/31 - patient aware. Adding treatment 3/7 pending due to conflicting appointment on schedule at another office. Once appointment removed patient will call me back and treatment will be added. Slot blocked in I27 @ 9:15 for 3/7 treatment.

## 2018-01-20 NOTE — Progress Notes (Signed)
Per Dr. [name], ok to treat patient with platelets 66, neutrophil 1.1, and creatinine of 1.67

## 2018-01-20 NOTE — Progress Notes (Signed)
Hematology and Oncology Follow Up Visit  Charles Coffey [id number] 04-May-1935 83 y.o. 01/20/2018 9:49 AM [name], MDFry, [name], MD   Principle Diagnosis: 83 year old man with urothelial carcinoma involving the upper left genitourinary tract and bladder diagnosed in November 2018.  He has a disease infiltrating through the bladder into the rectum in addition to retroperitoneal adenopathy indicating stage IV disease.  PDL 1 testing is pending.     Prior Therapy:  He is status post cystoscopy and transurethral resection of a bladder tumor and on 11/25/2017.  He is status post bilateral nephrostomy tube placement in November 2018.  He is status post diverting colostomy done on 12/01/2017 performed by Dr. Barry Dienes due to colonic obstruction.  Current therapy: Palliative chemotherapy utilizing gemcitabine and carboplatin.  Day 1 of cycle 1 was given on December 30, 2017.  Day 8 was eliminated from chemotherapy because of profound cytopenia.  He is here for cycle 2 of therapy.  Interim History: Charles Coffey is here with his wife and son.  He reports continuous improvement in his health since the last visit.  He reports continuing having bladder discomfort and spasm at times although ultrasound of the bladder did not show any residual urine at this time.  He is draining properly via bilateral nephrostomy tubes. His pain has been manageable with OxyContin twice a day although at nighttime he does not require it.  Breakthrough pain medication have been infrequent and at times uses tramadol.  He is able to ambulate without any falls or syncope.  His appetite is improving and have gained weight.  His lower extremity edema has also improved and currently on Lasix.  His sleep is also improved and currently takes trazodone at nighttime.  He denies any bleeding complaints at this time.  He denies any hemoptysis or hematemesis.  He does not report any hematuria or dysuria.  His colostomy output remains  adequate.  He does not report any headaches, blurry vision, syncope or seizures.  No new neurological deficits.  He does not report any fevers, chills, sweats. He does not report any palpitation, orthopnea. He does not report any cough, wheezing or hemoptysis. He does not report any nausea, vomiting or abdominal pain. He does not report any frequency urgency or hesitancy. He does not report any skeletal complaints rales or myalgias.  Mood appears stable without anxiety or depression.  He does not report any skin rashes or lesions.  He denied any petechiae. He does not report any lymphadenopathy.  He does not report any easy bruisability.  Remaining review of systems is negative.  Medications: I have reviewed the patient's medication.  Trazodone was added since last visit.  His Lasix was also reduced. Current Outpatient Medications  Medication Sig Dispense Refill  . acetaminophen (TYLENOL) 500 MG tablet Take 1,000 mg by mouth every 8 (eight) hours as needed for mild pain or moderate pain.    . carvedilol (COREG) 6.25 MG tablet Take 1 tablet (6.25 mg total) 2 (two) times daily with a meal by mouth. 180 tablet 3  . dicyclomine (BENTYL) 10 MG capsule Take 1-2 by mouth every 6 hours as needed for rectal spasms. 90 capsule 0  . diphenhydramine-acetaminophen (TYLENOL PM EXTRA STRENGTH) 25-500 MG TABS tablet Take 1 tablet by mouth at bedtime as needed.     . furosemide (LASIX) 40 MG tablet Take 1 tablet (40 mg total) by mouth daily. 30 tablet 1  . hydrALAZINE (APRESOLINE) 25 MG tablet Take 1 tablet (25 mg  total) by mouth 3 (three) times daily. 90 tablet 1  . Hydrocortisone Acetate (HEMORRHOIDAL-HC RE) Place 1 application rectally as needed (hemorrhoids).    . isosorbide mononitrate (IMDUR) 60 MG 24 hr tablet Take 1 tablet (60 mg total) by mouth daily. 90 tablet 1  . lidocaine-prilocaine (EMLA) cream Apply 1 application topically as needed. 30 g 0  . linaclotide (LINZESS) 290 MCG CAPS capsule TAKE 1 CAPSULE  (290 MCG TOTAL) BY MOUTH DAILY. 30 MINUTES PRIOR TO A MEAL 30 capsule 10  . Multiple Vitamin (MULTIVITAMIN WITH MINERALS) TABS tablet Take 1 tablet by mouth daily. 30 tablet 1  . oxyCODONE (OXYCONTIN) 10 mg 12 hr tablet Take 1 tablet (10 mg total) by mouth 2 (two) times daily. 60 tablet 0  . oxyCODONE-acetaminophen (PERCOCET) 10-325 MG tablet Take 1 tablet by mouth every 4 (four) hours as needed for pain. 30 tablet 0  . pramipexole (MIRAPEX) 1 MG tablet Take 1 mg by mouth daily.    . prochlorperazine (COMPAZINE) 10 MG tablet Take 1 tablet (10 mg total) by mouth every 6 (six) hours as needed for nausea or vomiting. 30 tablet 0  . ranitidine (ZANTAC) 300 MG tablet TAKE 1 TABLET BY MOUTH TWICE A DAY 60 tablet 3  . temazepam (RESTORIL) 30 MG capsule Take 1 capsule (30 mg total) by mouth at bedtime as needed for sleep. 30 capsule 2  . Tetrahydroz-Glyc-Hyprom-PEG (VISINE MAXIMUM REDNESS RELIEF OP) Place 2 drops into both eyes 2 (two) times daily as needed (dry eyes).     . traZODone (DESYREL) 50 MG tablet Take 1 tablet (50 mg total) by mouth at bedtime as needed for sleep. 30 tablet 2  . nitroGLYCERIN (NITROSTAT) 0.4 MG SL tablet Place 1 tablet (0.4 mg total) under the tongue every 5 (five) minutes as needed. 25 tablet 3   No current facility-administered medications for this visit.      Allergies:  Allergies  Allergen Reactions  . Antihistamines, Loratadine-Type Other (See Comments)    Unable to urinate  . Statins Other (See Comments)    liver effects    Past Medical History, Surgical history, Social history, and Family History was updated since the last visit and remain unchanged.   Physical Exam: Blood pressure 117/68, pulse 73, temperature (!) 97.5 F (36.4 C), temperature source Oral, resp. rate 18, height 5\' 8"  (1.727 m), weight 184 lb 8 oz (83.7 kg), SpO2 100 %. ECOG: 1 General appearance: Alert, awake pleasant gentleman without distress. Head: Normocephalic, without obvious  abnormality  Oral mucosa: No ulcers or thrush.  No lesions noted.  Mucous membranes are moist. Sclera is anicteric.  Extraocular movement intact. Lymph nodes: Cervical, supraclavicular, and axillary nodes normal. Heart: Regular rate and rhythm without murmurs or gallops. Lung: Clear to auscultation without wheezes or dullness to percussion. Abdomin: Colostomy remained in place with good output.  No rebound or guarding.  Good bowel sounds. Skeletal: No joint deformity or effusion.  Minimal edema noted in his lower extremities. Skin: No rashes or lesions.  No ecchymosis. Psychiatric: Mood and affect is appropriate. Neurological: He is able to ambulate without difficulties.  No deficits noted.   Lab Results: Lab Results  Component Value Date   WBC 3.0 (L) 01/20/2018   HGB 8.5 (L) 01/04/2018   HCT 24.3 (L) 01/20/2018   MCV 91.0 01/20/2018   PLT 66 (L) 01/20/2018     Chemistry      Component Value Date/Time   NA 132 (L) 01/03/2018 0852   NA  133 (L) 12/29/2017 1014   K 3.9 01/03/2018 0852   K 4.4 12/29/2017 1014   CL 110 01/03/2018 0852   CO2 17 (L) 01/03/2018 0852   CO2 20 (L) 12/29/2017 1014   BUN 35 (H) 01/03/2018 0852   BUN 31.5 (H) 12/29/2017 1014   CREATININE 1.34 (H) 01/03/2018 0852   CREATININE 1.6 (H) 12/29/2017 1014      Component Value Date/Time   CALCIUM 8.1 (L) 01/03/2018 0852   CALCIUM 9.6 12/29/2017 1014   ALKPHOS 45 01/02/2018 1055   ALKPHOS 61 12/29/2017 1014   AST 58 (H) 01/02/2018 1055   AST 15 12/29/2017 1014   ALT 28 01/02/2018 1055   ALT 13 12/29/2017 1014   BILITOT 0.9 01/02/2018 1055   BILITOT 0.43 12/29/2017 1014       Impression and Plan:   83 year old gentleman with the following  1.  Stage IV urothelial carcinoma arising from the upper left genitourinary tract involving renal pelvis, ureter and bladder.  His disease is T4 with rectal involvement as well as a retroperitoneal involvement that was biopsy-proven as well after a diverting  colostomy.  No distant metastasis noted.   He is currently receiving palliative chemotherapy with gemcitabine and carboplatin.  He is not a cisplatin candidate because of his renal function, cytopenia and heart failure.  Risks and benefits of continuing with cycle 2 of chemotherapy was reviewed today.  Complications that include worsening cytopenia, bleeding, recurrent infections were reviewed.  The plan is to proceed with cycle 2 of chemotherapy today and repeat imaging studies after cycle 3.    2.  Bilateral hydronephrosis: He has bilateral nephrostomy tube remained in place.  No urine output via the bladder at this time.  3.  Thrombocytopenia: Multifactorial in nature mostly related to malignancy and chemotherapy.  His count is recovering.  He will receive gemcitabine and reduced dose.  4.  IV access: Port-A-Cath remain in place without complications.  No difficulty accessing.  5.  Antiemetics: Nausea or vomiting noted since last visit.  Prescription for Compazine is available to him.  6.  Pain: Mostly in the pelvic area and due to bladder tumor pushing on his rectum.  He is currently on OxyContin at 10 mg twice a day with breakthrough Percocet every 4-6 hours.    He is using less breakthrough pain medication with Ultram as well.  Pain appears to be improving at this time.  7.  Myocardial infarction: No residual effect noted at this time.  He has compensated heart failure at this time without any fluid overload.  8.  Deep vein thrombosis: Off anticoagulation because of risk of bleeding.  He is thrombosis is limited in the deep vein.  We will continue to monitor moving forward.  9.  Anemia: Multifactorial in nature related to malignancy and chemotherapy.  We will proceed with chemotherapy today and potentially with transfusion if needed to next week.  10.  Neutropenia prophylaxis: He will receive Neulasta after each chemotherapy.  Complication associated with this therapy including  arthralgias, myalgias among others.  11.  Follow-up: Will be in 2 weeks for an evaluation.  He will receive cycle 3 in 3 weeks.  25 minutes was spent today with the patient face-to-face.  More than 50% was spent today on education, counseling as well as answering questions today.  Zola Button, MD 1/24/20199:49 AM

## 2018-01-21 ENCOUNTER — Ambulatory Visit: Payer: Medicare HMO

## 2018-01-21 ENCOUNTER — Encounter (HOSPITAL_COMMUNITY): Payer: Self-pay

## 2018-01-21 DIAGNOSIS — Z7189 Other specified counseling: Secondary | ICD-10-CM | POA: Diagnosis not present

## 2018-01-21 DIAGNOSIS — I255 Ischemic cardiomyopathy: Secondary | ICD-10-CM | POA: Diagnosis not present

## 2018-01-21 DIAGNOSIS — N183 Chronic kidney disease, stage 3 (moderate): Secondary | ICD-10-CM | POA: Diagnosis not present

## 2018-01-21 DIAGNOSIS — C679 Malignant neoplasm of bladder, unspecified: Secondary | ICD-10-CM | POA: Diagnosis not present

## 2018-01-22 ENCOUNTER — Inpatient Hospital Stay: Payer: Medicare HMO

## 2018-01-22 VITALS — BP 126/76 | HR 68 | Temp 97.8°F | Resp 20

## 2018-01-22 DIAGNOSIS — Z5189 Encounter for other specified aftercare: Secondary | ICD-10-CM | POA: Diagnosis not present

## 2018-01-22 DIAGNOSIS — C679 Malignant neoplasm of bladder, unspecified: Secondary | ICD-10-CM

## 2018-01-22 DIAGNOSIS — C786 Secondary malignant neoplasm of retroperitoneum and peritoneum: Secondary | ICD-10-CM | POA: Diagnosis not present

## 2018-01-22 DIAGNOSIS — C7951 Secondary malignant neoplasm of bone: Secondary | ICD-10-CM | POA: Diagnosis not present

## 2018-01-22 DIAGNOSIS — Z5111 Encounter for antineoplastic chemotherapy: Secondary | ICD-10-CM | POA: Diagnosis not present

## 2018-01-22 DIAGNOSIS — N1330 Unspecified hydronephrosis: Secondary | ICD-10-CM | POA: Diagnosis not present

## 2018-01-22 DIAGNOSIS — C785 Secondary malignant neoplasm of large intestine and rectum: Secondary | ICD-10-CM | POA: Diagnosis not present

## 2018-01-22 DIAGNOSIS — D630 Anemia in neoplastic disease: Secondary | ICD-10-CM | POA: Diagnosis not present

## 2018-01-22 DIAGNOSIS — D6959 Other secondary thrombocytopenia: Secondary | ICD-10-CM | POA: Diagnosis not present

## 2018-01-22 DIAGNOSIS — D6481 Anemia due to antineoplastic chemotherapy: Secondary | ICD-10-CM | POA: Diagnosis not present

## 2018-01-22 MED ORDER — PEGFILGRASTIM INJECTION 6 MG/0.6ML ~~LOC~~
6.0000 mg | PREFILLED_SYRINGE | Freq: Once | SUBCUTANEOUS | Status: AC
  Administered 2018-01-22: 6 mg via SUBCUTANEOUS

## 2018-01-22 NOTE — Patient Instructions (Signed)
Pegfilgrastim injection What is this medicine? PEGFILGRASTIM (PEG fil gra stim) is a long-acting granulocyte colony-stimulating factor that stimulates the growth of neutrophils, a type of white blood cell important in the body's fight against infection. It is used to reduce the incidence of fever and infection in patients with certain types of cancer who are receiving chemotherapy that affects the bone marrow, and to increase survival after being exposed to high doses of radiation. This medicine may be used for other purposes; ask your health care provider or pharmacist if you have questions. COMMON BRAND NAME(S): Neulasta What should I tell my health care provider before I take this medicine? They need to know if you have any of these conditions: -kidney disease -latex allergy -ongoing radiation therapy -sickle cell disease -skin reactions to acrylic adhesives (On-Body Injector only) -an unusual or allergic reaction to pegfilgrastim, filgrastim, other medicines, foods, dyes, or preservatives -pregnant or trying to get pregnant -breast-feeding How should I use this medicine? This medicine is for injection under the skin. If you get this medicine at home, you will be taught how to prepare and give the pre-filled syringe or how to use the On-body Injector. Refer to the patient Instructions for Use for detailed instructions. Use exactly as directed. Tell your healthcare provider immediately if you suspect that the On-body Injector may not have performed as intended or if you suspect the use of the On-body Injector resulted in a missed or partial dose. It is important that you put your used needles and syringes in a special sharps container. Do not put them in a trash can. If you do not have a sharps container, call your pharmacist or healthcare provider to get one. Talk to your pediatrician regarding the use of this medicine in children. While this drug may be prescribed for selected conditions,  precautions do apply. Overdosage: If you think you have taken too much of this medicine contact a poison control center or emergency room at once. NOTE: This medicine is only for you. Do not share this medicine with others. What if I miss a dose? It is important not to miss your dose. Call your doctor or health care professional if you miss your dose. If you miss a dose due to an On-body Injector failure or leakage, a new dose should be administered as soon as possible using a single prefilled syringe for manual use. What may interact with this medicine? Interactions have not been studied. Give your health care provider a list of all the medicines, herbs, non-prescription drugs, or dietary supplements you use. Also tell them if you smoke, drink alcohol, or use illegal drugs. Some items may interact with your medicine. This list may not describe all possible interactions. Give your health care provider a list of all the medicines, herbs, non-prescription drugs, or dietary supplements you use. Also tell them if you smoke, drink alcohol, or use illegal drugs. Some items may interact with your medicine. What should I watch for while using this medicine? You may need blood work done while you are taking this medicine. If you are going to need a MRI, CT scan, or other procedure, tell your doctor that you are using this medicine (On-Body Injector only). What side effects may I notice from receiving this medicine? Side effects that you should report to your doctor or health care professional as soon as possible: -allergic reactions like skin rash, itching or hives, swelling of the face, lips, or tongue -dizziness -fever -pain, redness, or irritation at site   where injected -pinpoint red spots on the skin -red or dark-brown urine -shortness of breath or breathing problems -stomach or side pain, or pain at the shoulder -swelling -tiredness -trouble passing urine or change in the amount of urine Side  effects that usually do not require medical attention (report to your doctor or health care professional if they continue or are bothersome): -bone pain -muscle pain This list may not describe all possible side effects. Call your doctor for medical advice about side effects. You may report side effects to FDA at 1-800-FDA-1088. Where should I keep my medicine? Keep out of the reach of children. Store pre-filled syringes in a refrigerator between 2 and 8 degrees C (36 and 46 degrees F). Do not freeze. Keep in carton to protect from light. Throw away this medicine if it is left out of the refrigerator for more than 48 hours. Throw away any unused medicine after the expiration date. NOTE: This sheet is a summary. It may not cover all possible information. If you have questions about this medicine, talk to your doctor, pharmacist, or health care provider.  2018 Elsevier/Gold Standard (2016-12-10 12:58:03)  

## 2018-01-24 ENCOUNTER — Other Ambulatory Visit: Payer: Self-pay | Admitting: Urology

## 2018-01-24 DIAGNOSIS — N1330 Unspecified hydronephrosis: Secondary | ICD-10-CM

## 2018-01-24 DIAGNOSIS — C662 Malignant neoplasm of left ureter: Secondary | ICD-10-CM

## 2018-01-25 ENCOUNTER — Encounter: Payer: Self-pay | Admitting: *Deleted

## 2018-01-25 ENCOUNTER — Other Ambulatory Visit: Payer: Self-pay | Admitting: *Deleted

## 2018-01-25 ENCOUNTER — Encounter: Payer: Self-pay | Admitting: Oncology

## 2018-01-25 ENCOUNTER — Encounter: Payer: Self-pay | Admitting: General Practice

## 2018-01-25 DIAGNOSIS — C679 Malignant neoplasm of bladder, unspecified: Secondary | ICD-10-CM

## 2018-01-25 MED ORDER — OXYCODONE-ACETAMINOPHEN 10-325 MG PO TABS: 1.0000 | ORAL_TABLET | ORAL | 0 refills | Status: DC | PRN

## 2018-01-25 NOTE — Telephone Encounter (Signed)
Wife Charles Coffey calling for a refill script for percocet. Signed script left at front for patient p/u.

## 2018-01-25 NOTE — Telephone Encounter (Signed)
Chemo after to 3/7 appointments - previous conflict resolved. Start time remains the same.

## 2018-01-25 NOTE — Progress Notes (Signed)
[place] Note  Received f/u call from wife [name] today, who is struggling with caregiver distress (seeing pt suffer, managing details at home, exerting energy to host visiting family, missing out on important self-care routines, etc). Normalized feelings, provided emotional support, brainstormed coping tools.   [name] and I plan to meet in my office while [name] receives transfusion on Thursday 1/31. Depending on [name]'s energy level and desire for support, we may schedule a Spiritual Care appt with him on another day when they will already be on campus.  [place], [place], [facility] Pager [phone] Voicemail [phone]

## 2018-01-26 ENCOUNTER — Encounter: Payer: Self-pay | Admitting: *Deleted

## 2018-01-26 DIAGNOSIS — I255 Ischemic cardiomyopathy: Secondary | ICD-10-CM | POA: Diagnosis not present

## 2018-01-26 DIAGNOSIS — K5904 Chronic idiopathic constipation: Secondary | ICD-10-CM | POA: Diagnosis not present

## 2018-01-26 DIAGNOSIS — I252 Old myocardial infarction: Secondary | ICD-10-CM | POA: Diagnosis not present

## 2018-01-26 DIAGNOSIS — Z436 Encounter for attention to other artificial openings of urinary tract: Secondary | ICD-10-CM | POA: Diagnosis not present

## 2018-01-26 DIAGNOSIS — Z433 Encounter for attention to colostomy: Secondary | ICD-10-CM | POA: Diagnosis not present

## 2018-01-26 DIAGNOSIS — K589 Irritable bowel syndrome without diarrhea: Secondary | ICD-10-CM | POA: Diagnosis not present

## 2018-01-26 DIAGNOSIS — K624 Stenosis of anus and rectum: Secondary | ICD-10-CM | POA: Diagnosis not present

## 2018-01-26 DIAGNOSIS — I2510 Atherosclerotic heart disease of native coronary artery without angina pectoris: Secondary | ICD-10-CM | POA: Diagnosis not present

## 2018-01-26 DIAGNOSIS — C679 Malignant neoplasm of bladder, unspecified: Secondary | ICD-10-CM | POA: Diagnosis not present

## 2018-01-26 DIAGNOSIS — D126 Benign neoplasm of colon, unspecified: Secondary | ICD-10-CM | POA: Diagnosis not present

## 2018-01-26 NOTE — Progress Notes (Signed)
Spoke with patient's wife, script left at front for patient p/u.

## 2018-01-27 ENCOUNTER — Encounter: Payer: Self-pay | Admitting: *Deleted

## 2018-01-27 ENCOUNTER — Other Ambulatory Visit: Payer: Medicare HMO

## 2018-01-27 ENCOUNTER — Ambulatory Visit (HOSPITAL_COMMUNITY)
Admission: RE | Admit: 2018-01-27 | Discharge: 2018-01-27 | Disposition: A | Discharge status: Home / Self Care | Payer: Medicare HMO | Source: Ambulatory Visit | Attending: Oncology | Admitting: Oncology

## 2018-01-27 ENCOUNTER — Other Ambulatory Visit: Payer: Self-pay | Admitting: *Deleted

## 2018-01-27 ENCOUNTER — Inpatient Hospital Stay: Payer: Medicare HMO

## 2018-01-27 ENCOUNTER — Ambulatory Visit: Payer: Medicare HMO

## 2018-01-27 DIAGNOSIS — D6489 Other specified anemias: Secondary | ICD-10-CM | POA: Diagnosis not present

## 2018-01-27 DIAGNOSIS — C679 Malignant neoplasm of bladder, unspecified: Secondary | ICD-10-CM

## 2018-01-27 DIAGNOSIS — N1330 Unspecified hydronephrosis: Secondary | ICD-10-CM | POA: Diagnosis not present

## 2018-01-27 DIAGNOSIS — D649 Anemia, unspecified: Secondary | ICD-10-CM

## 2018-01-27 DIAGNOSIS — D630 Anemia in neoplastic disease: Secondary | ICD-10-CM | POA: Diagnosis not present

## 2018-01-27 DIAGNOSIS — Z5111 Encounter for antineoplastic chemotherapy: Secondary | ICD-10-CM | POA: Diagnosis not present

## 2018-01-27 DIAGNOSIS — Z5189 Encounter for other specified aftercare: Secondary | ICD-10-CM | POA: Diagnosis not present

## 2018-01-27 DIAGNOSIS — D6959 Other secondary thrombocytopenia: Secondary | ICD-10-CM | POA: Diagnosis not present

## 2018-01-27 DIAGNOSIS — C7951 Secondary malignant neoplasm of bone: Secondary | ICD-10-CM | POA: Diagnosis not present

## 2018-01-27 DIAGNOSIS — C786 Secondary malignant neoplasm of retroperitoneum and peritoneum: Secondary | ICD-10-CM | POA: Diagnosis not present

## 2018-01-27 DIAGNOSIS — D6481 Anemia due to antineoplastic chemotherapy: Secondary | ICD-10-CM | POA: Diagnosis not present

## 2018-01-27 DIAGNOSIS — C785 Secondary malignant neoplasm of large intestine and rectum: Secondary | ICD-10-CM | POA: Diagnosis not present

## 2018-01-27 LAB — CBC WITH DIFFERENTIAL (CANCER CENTER ONLY)
BASOS ABS: 0 10*3/uL (ref 0.0–0.1)
BASOS PCT: 0 %
Eosinophils Absolute: 0 10*3/uL (ref 0.0–0.5)
Eosinophils Relative: 0 %
HEMATOCRIT: 20.8 % — AB (ref 38.4–49.9)
Hemoglobin: 7 g/dL — CL (ref 13.0–17.1)
Lymphocytes Relative: 6 %
Lymphs Abs: 0.9 10*3/uL (ref 0.9–3.3)
MCH: 30.5 pg (ref 27.2–33.4)
MCHC: 33.7 g/dL (ref 32.0–36.0)
MCV: 90.5 fL (ref 79.3–98.0)
Monocytes Absolute: 1.2 10*3/uL — ABNORMAL HIGH (ref 0.1–0.9)
Monocytes Relative: 8 %
NEUTROS ABS: 12.1 10*3/uL — AB (ref 1.5–6.5)
Neutrophils Relative %: 86 %
Platelet Count: 42 10*3/uL — ABNORMAL LOW (ref 140–400)
RBC: 2.3 MIL/uL — AB (ref 4.20–5.82)
RDW: 18.4 % — ABNORMAL HIGH (ref 11.0–14.6)
WBC: 14.2 10*3/uL — AB (ref 4.0–10.3)

## 2018-01-27 LAB — CMP (CANCER CENTER ONLY)
ALBUMIN: 3.3 g/dL — AB (ref 3.5–5.0)
ALT: 10 U/L (ref 0–55)
AST: 20 U/L (ref 5–34)
Alkaline Phosphatase: 104 U/L (ref 40–150)
Anion gap: 9 (ref 3–11)
BILIRUBIN TOTAL: 0.4 mg/dL (ref 0.2–1.2)
BUN: 36 mg/dL — ABNORMAL HIGH (ref 7–26)
CHLORIDE: 103 mmol/L (ref 98–109)
CO2: 21 mmol/L — ABNORMAL LOW (ref 22–29)
Calcium: 8.9 mg/dL (ref 8.4–10.4)
Creatinine: 1.42 mg/dL — ABNORMAL HIGH (ref 0.70–1.30)
GFR, EST NON AFRICAN AMERICAN: 45 mL/min — AB (ref >60)
GFR, Est AFR Am: 52 mL/min — ABNORMAL LOW (ref >60)
Glucose, Bld: 108 mg/dL (ref 70–140)
POTASSIUM: 3.8 mmol/L (ref 3.5–5.1)
Sodium: 133 mmol/L — ABNORMAL LOW (ref 136–145)
TOTAL PROTEIN: 6.3 g/dL — AB (ref 6.4–8.3)

## 2018-01-27 LAB — SAMPLE TO BLOOD BANK

## 2018-01-27 LAB — PREPARE RBC (CROSSMATCH)

## 2018-01-27 MED ORDER — SODIUM CHLORIDE 0.9% FLUSH: 10.0000 mL | INTRAVENOUS | Status: DC | PRN

## 2018-01-27 MED ORDER — SODIUM CHLORIDE 0.9 % IV SOLN: 250.0000 mL | Freq: Once | INTRAVENOUS | Status: DC

## 2018-01-27 MED ORDER — HEPARIN SOD (PORK) LOCK FLUSH 100 UNIT/ML IV SOLN
500.0000 [IU] | Freq: Every day | INTRAVENOUS | Status: AC | PRN
  Administered 2018-01-27: 500 [IU]
  Filled 2018-01-27: qty 5

## 2018-01-27 MED ORDER — ACETAMINOPHEN 325 MG PO TABS
650.0000 mg | ORAL_TABLET | Freq: Once | ORAL | Status: AC
  Administered 2018-01-27: 650 mg via ORAL
  Filled 2018-01-27: qty 2

## 2018-01-27 MED ORDER — DIPHENHYDRAMINE HCL 25 MG PO CAPS: 25.0000 mg | ORAL_CAPSULE | Freq: Once | ORAL | Status: DC

## 2018-01-27 NOTE — Discharge Instructions (Signed)

## 2018-01-27 NOTE — Patient Instructions (Signed)
Implanted Port Home Guide An implanted port is a type of central line that is placed under the skin. Central lines are used to provide IV access when treatment or nutrition needs to be given through a person's veins. Implanted ports are used for long-term IV access. An implanted port may be placed because:  You need IV medicine that would be irritating to the small veins in your hands or arms.  You need long-term IV medicines, such as antibiotics.  You need IV nutrition for a long period.  You need frequent blood draws for lab tests.  You need dialysis.  Implanted ports are usually placed in the chest area, but they can also be placed in the upper arm, the abdomen, or the leg. An implanted port has two main parts:  Reservoir. The reservoir is round and will appear as a small, raised area under your skin. The reservoir is the part where a needle is inserted to give medicines or draw blood.  Catheter. The catheter is a thin, flexible tube that extends from the reservoir. The catheter is placed into a large vein. Medicine that is inserted into the reservoir goes into the catheter and then into the vein.  How will I care for my incision site? Do not get the incision site wet. Bathe or shower as directed by your health care provider. How is my port accessed? Special steps must be taken to access the port:  Before the port is accessed, a numbing cream can be placed on the skin. This helps numb the skin over the port site.  Your health care provider uses a sterile technique to access the port. ? Your health care provider must put on a mask and sterile gloves. ? The skin over your port is cleaned carefully with an antiseptic and allowed to dry. ? The port is gently pinched between sterile gloves, and a needle is inserted into the port.  Only "non-coring" port needles should be used to access the port. Once the port is accessed, a blood return should be checked. This helps ensure that the port  is in the vein and is not clogged.  If your port needs to remain accessed for a constant infusion, a clear (transparent) bandage will be placed over the needle site. The bandage and needle will need to be changed every week, or as directed by your health care provider.  Keep the bandage covering the needle clean and dry. Do not get it wet. Follow your health care provider's instructions on how to take a shower or bath while the port is accessed.  If your port does not need to stay accessed, no bandage is needed over the port.  What is flushing? Flushing helps keep the port from getting clogged. Follow your health care provider's instructions on how and when to flush the port. Ports are usually flushed with saline solution or a medicine called heparin. The need for flushing will depend on how the port is used.  If the port is used for intermittent medicines or blood draws, the port will need to be flushed: ? After medicines have been given. ? After blood has been drawn. ? As part of routine maintenance.  If a constant infusion is running, the port may not need to be flushed.  How long will my port stay implanted? The port can stay in for as long as your health care provider thinks it is needed. When it is time for the port to come out, surgery will be   done to remove it. The procedure is similar to the one performed when the port was put in. When should I seek immediate medical care? When you have an implanted port, you should seek immediate medical care if:  You notice a bad smell coming from the incision site.  You have swelling, redness, or drainage at the incision site.  You have more swelling or pain at the port site or the surrounding area.  You have a fever that is not controlled with medicine.  This information is not intended to replace advice given to you by your health care provider. Make sure you discuss any questions you have with your health care provider. Document  Released: 12/14/2005 Document Revised: 05/21/2016 Document Reviewed: 08/21/2013 Elsevier Interactive Patient Education  2017 [employer]  

## 2018-01-27 NOTE — Progress Notes (Signed)
PATIENT CARE CENTER NOTE  Diagnosis: Anemia   Provider: Dr. [name]   Procedure: Transfusion of 2 units PRBC's    Note: Patient received transfusion of 2 units of blood. Patient pre-medicated with Tylenol. Tolerated infusion well with no adverse reaction. Vitals remained stable. Discharge instructions given to patient. Patient alert, oriented and ambulatory at discharge.

## 2018-01-28 ENCOUNTER — Telehealth: Payer: Self-pay | Admitting: *Deleted

## 2018-01-28 ENCOUNTER — Other Ambulatory Visit: Payer: Self-pay | Admitting: Urology

## 2018-01-28 ENCOUNTER — Ambulatory Visit (HOSPITAL_COMMUNITY)
Admission: RE | Admit: 2018-01-28 | Discharge: 2018-01-28 | Disposition: A | Discharge status: Home / Self Care | Payer: Medicare HMO | Source: Ambulatory Visit | Attending: Urology | Admitting: Urology

## 2018-01-28 ENCOUNTER — Encounter (HOSPITAL_COMMUNITY): Payer: Self-pay | Admitting: Interventional Radiology

## 2018-01-28 ENCOUNTER — Encounter: Payer: Self-pay | Admitting: *Deleted

## 2018-01-28 DIAGNOSIS — N12 Tubulo-interstitial nephritis, not specified as acute or chronic: Secondary | ICD-10-CM | POA: Diagnosis present

## 2018-01-28 DIAGNOSIS — B9562 Methicillin resistant Staphylococcus aureus infection as the cause of diseases classified elsewhere: Secondary | ICD-10-CM | POA: Diagnosis present

## 2018-01-28 DIAGNOSIS — I5023 Acute on chronic systolic (congestive) heart failure: Secondary | ICD-10-CM | POA: Diagnosis present

## 2018-01-28 DIAGNOSIS — Z981 Arthrodesis status: Secondary | ICD-10-CM

## 2018-01-28 DIAGNOSIS — I959 Hypotension, unspecified: Secondary | ICD-10-CM | POA: Diagnosis present

## 2018-01-28 DIAGNOSIS — E785 Hyperlipidemia, unspecified: Secondary | ICD-10-CM | POA: Diagnosis present

## 2018-01-28 DIAGNOSIS — Z79891 Long term (current) use of opiate analgesic: Secondary | ICD-10-CM

## 2018-01-28 DIAGNOSIS — N135 Crossing vessel and stricture of ureter without hydronephrosis: Secondary | ICD-10-CM | POA: Diagnosis present

## 2018-01-28 DIAGNOSIS — D630 Anemia in neoplastic disease: Secondary | ICD-10-CM | POA: Diagnosis present

## 2018-01-28 DIAGNOSIS — G2581 Restless legs syndrome: Secondary | ICD-10-CM | POA: Diagnosis present

## 2018-01-28 DIAGNOSIS — N1330 Unspecified hydronephrosis: Secondary | ICD-10-CM

## 2018-01-28 DIAGNOSIS — R0602 Shortness of breath: Secondary | ICD-10-CM | POA: Diagnosis not present

## 2018-01-28 DIAGNOSIS — N183 Chronic kidney disease, stage 3 (moderate): Secondary | ICD-10-CM | POA: Diagnosis present

## 2018-01-28 DIAGNOSIS — N3289 Other specified disorders of bladder: Secondary | ICD-10-CM | POA: Diagnosis present

## 2018-01-28 DIAGNOSIS — Z8249 Family history of ischemic heart disease and other diseases of the circulatory system: Secondary | ICD-10-CM

## 2018-01-28 DIAGNOSIS — C662 Malignant neoplasm of left ureter: Secondary | ICD-10-CM

## 2018-01-28 DIAGNOSIS — N320 Bladder-neck obstruction: Secondary | ICD-10-CM | POA: Diagnosis present

## 2018-01-28 DIAGNOSIS — I2510 Atherosclerotic heart disease of native coronary artery without angina pectoris: Secondary | ICD-10-CM | POA: Diagnosis present

## 2018-01-28 DIAGNOSIS — I130 Hypertensive heart and chronic kidney disease with heart failure and stage 1 through stage 4 chronic kidney disease, or unspecified chronic kidney disease: Principal | ICD-10-CM | POA: Diagnosis present

## 2018-01-28 DIAGNOSIS — Z933 Colostomy status: Secondary | ICD-10-CM

## 2018-01-28 DIAGNOSIS — N401 Enlarged prostate with lower urinary tract symptoms: Secondary | ICD-10-CM | POA: Diagnosis present

## 2018-01-28 DIAGNOSIS — I739 Peripheral vascular disease, unspecified: Secondary | ICD-10-CM | POA: Diagnosis present

## 2018-01-28 DIAGNOSIS — I4510 Unspecified right bundle-branch block: Secondary | ICD-10-CM | POA: Diagnosis present

## 2018-01-28 DIAGNOSIS — K594 Anal spasm: Secondary | ICD-10-CM | POA: Diagnosis present

## 2018-01-28 DIAGNOSIS — Z951 Presence of aortocoronary bypass graft: Secondary | ICD-10-CM

## 2018-01-28 DIAGNOSIS — Z888 Allergy status to other drugs, medicaments and biological substances status: Secondary | ICD-10-CM

## 2018-01-28 DIAGNOSIS — Z436 Encounter for attention to other artificial openings of urinary tract: Secondary | ICD-10-CM | POA: Insufficient documentation

## 2018-01-28 DIAGNOSIS — Z79899 Other long term (current) drug therapy: Secondary | ICD-10-CM

## 2018-01-28 DIAGNOSIS — M2550 Pain in unspecified joint: Secondary | ICD-10-CM | POA: Diagnosis present

## 2018-01-28 DIAGNOSIS — I255 Ischemic cardiomyopathy: Secondary | ICD-10-CM | POA: Diagnosis present

## 2018-01-28 DIAGNOSIS — D6959 Other secondary thrombocytopenia: Secondary | ICD-10-CM | POA: Diagnosis present

## 2018-01-28 DIAGNOSIS — R748 Abnormal levels of other serum enzymes: Secondary | ICD-10-CM | POA: Diagnosis present

## 2018-01-28 DIAGNOSIS — R0902 Hypoxemia: Secondary | ICD-10-CM | POA: Diagnosis present

## 2018-01-28 DIAGNOSIS — D61818 Other pancytopenia: Secondary | ICD-10-CM | POA: Diagnosis present

## 2018-01-28 DIAGNOSIS — R319 Hematuria, unspecified: Secondary | ICD-10-CM | POA: Diagnosis present

## 2018-01-28 DIAGNOSIS — K589 Irritable bowel syndrome without diarrhea: Secondary | ICD-10-CM | POA: Diagnosis present

## 2018-01-28 DIAGNOSIS — A419 Sepsis, unspecified organism: Secondary | ICD-10-CM | POA: Diagnosis present

## 2018-01-28 DIAGNOSIS — C785 Secondary malignant neoplasm of large intestine and rectum: Secondary | ICD-10-CM | POA: Diagnosis present

## 2018-01-28 DIAGNOSIS — T451X5A Adverse effect of antineoplastic and immunosuppressive drugs, initial encounter: Secondary | ICD-10-CM | POA: Diagnosis present

## 2018-01-28 DIAGNOSIS — E871 Hypo-osmolality and hyponatremia: Secondary | ICD-10-CM | POA: Diagnosis present

## 2018-01-28 DIAGNOSIS — T458X5A Adverse effect of other primarily systemic and hematological agents, initial encounter: Secondary | ICD-10-CM | POA: Diagnosis present

## 2018-01-28 DIAGNOSIS — G9341 Metabolic encephalopathy: Secondary | ICD-10-CM | POA: Diagnosis present

## 2018-01-28 DIAGNOSIS — K219 Gastro-esophageal reflux disease without esophagitis: Secondary | ICD-10-CM | POA: Diagnosis present

## 2018-01-28 DIAGNOSIS — G8929 Other chronic pain: Secondary | ICD-10-CM | POA: Diagnosis present

## 2018-01-28 DIAGNOSIS — Z87891 Personal history of nicotine dependence: Secondary | ICD-10-CM

## 2018-01-28 DIAGNOSIS — I252 Old myocardial infarction: Secondary | ICD-10-CM

## 2018-01-28 DIAGNOSIS — M545 Low back pain: Secondary | ICD-10-CM | POA: Diagnosis present

## 2018-01-28 DIAGNOSIS — D6481 Anemia due to antineoplastic chemotherapy: Secondary | ICD-10-CM | POA: Diagnosis present

## 2018-01-28 DIAGNOSIS — E440 Moderate protein-calorie malnutrition: Secondary | ICD-10-CM | POA: Diagnosis present

## 2018-01-28 DIAGNOSIS — C679 Malignant neoplasm of bladder, unspecified: Secondary | ICD-10-CM | POA: Diagnosis present

## 2018-01-28 HISTORY — PX: IR NEPHROSTOMY EXCHANGE RIGHT: IMG6070

## 2018-01-28 HISTORY — PX: IR NEPHROSTOMY EXCHANGE LEFT: IMG6069

## 2018-01-28 LAB — TYPE AND SCREEN
ABO/RH(D): A POS
ANTIBODY SCREEN: NEGATIVE
UNIT DIVISION: 0
UNIT DIVISION: 0

## 2018-01-28 LAB — BPAM RBC
Blood Product Expiration Date: 201902152359
Blood Product Expiration Date: 201902152359
ISSUE DATE / TIME: 201901311102
ISSUE DATE / TIME: 201901311102
UNIT TYPE AND RH: 6200
Unit Type and Rh: 6200

## 2018-01-28 MED ORDER — LIDOCAINE HCL 1 % IJ SOLN
INTRAMUSCULAR | Status: AC
  Filled 2018-01-28: qty 20

## 2018-01-28 MED ORDER — IOPAMIDOL (ISOVUE-300) INJECTION 61%
INTRAVENOUS | Status: AC
  Administered 2018-01-28: 20 mL
  Filled 2018-01-28: qty 50

## 2018-01-28 MED ORDER — IOPAMIDOL (ISOVUE-300) INJECTION 61%
50.0000 mL | Freq: Once | INTRAVENOUS | Status: AC | PRN
  Administered 2018-01-28: 20 mL

## 2018-01-28 NOTE — Telephone Encounter (Signed)
Wife lennie calling. Sates patient's urine is dark, he feels badly, temp 99.4, urostomy tubes are to be changed today at 3:00 pm. States skin around one of the tubes is red and angry looking.tubes have never been changed. Encouraged her to have him push fluids, tylenol for elevated temp, keep appt with I.R. To have tubes changed and call back for any issues.

## 2018-01-28 NOTE — Procedures (Signed)
Interventional Radiology Procedure Note  Procedure: bilateral nephrostomy tube exchange, 30F drain placed bilateral.  Complications: None Recommendations:  - To gravity drain - Routine drain exchange    Signed,  Dulcy Fanny. Earleen Newport, DO

## 2018-01-31 ENCOUNTER — Other Ambulatory Visit: Payer: Self-pay

## 2018-01-31 ENCOUNTER — Inpatient Hospital Stay (HOSPITAL_COMMUNITY)
Admission: EM | Admit: 2018-01-31 | Discharge: 2018-02-05 | DRG: 291 | Disposition: A | Discharge status: Home Health | Payer: Medicare HMO | Attending: Internal Medicine | Admitting: Internal Medicine

## 2018-01-31 ENCOUNTER — Emergency Department (HOSPITAL_COMMUNITY): Payer: Medicare HMO

## 2018-01-31 ENCOUNTER — Encounter (HOSPITAL_COMMUNITY): Payer: Self-pay

## 2018-01-31 DIAGNOSIS — N135 Crossing vessel and stricture of ureter without hydronephrosis: Secondary | ICD-10-CM | POA: Diagnosis present

## 2018-01-31 DIAGNOSIS — Z936 Other artificial openings of urinary tract status: Secondary | ICD-10-CM | POA: Diagnosis not present

## 2018-01-31 DIAGNOSIS — R338 Other retention of urine: Secondary | ICD-10-CM | POA: Diagnosis not present

## 2018-01-31 DIAGNOSIS — R0902 Hypoxemia: Secondary | ICD-10-CM | POA: Diagnosis present

## 2018-01-31 DIAGNOSIS — Z933 Colostomy status: Secondary | ICD-10-CM | POA: Diagnosis not present

## 2018-01-31 DIAGNOSIS — I130 Hypertensive heart and chronic kidney disease with heart failure and stage 1 through stage 4 chronic kidney disease, or unspecified chronic kidney disease: Secondary | ICD-10-CM | POA: Diagnosis not present

## 2018-01-31 DIAGNOSIS — N320 Bladder-neck obstruction: Secondary | ICD-10-CM | POA: Diagnosis present

## 2018-01-31 DIAGNOSIS — D61818 Other pancytopenia: Secondary | ICD-10-CM | POA: Diagnosis not present

## 2018-01-31 DIAGNOSIS — I509 Heart failure, unspecified: Secondary | ICD-10-CM

## 2018-01-31 DIAGNOSIS — I2510 Atherosclerotic heart disease of native coronary artery without angina pectoris: Secondary | ICD-10-CM | POA: Diagnosis present

## 2018-01-31 DIAGNOSIS — R7989 Other specified abnormal findings of blood chemistry: Secondary | ICD-10-CM | POA: Diagnosis present

## 2018-01-31 DIAGNOSIS — D691 Qualitative platelet defects: Secondary | ICD-10-CM | POA: Insufficient documentation

## 2018-01-31 DIAGNOSIS — N390 Urinary tract infection, site not specified: Secondary | ICD-10-CM | POA: Diagnosis present

## 2018-01-31 DIAGNOSIS — E440 Moderate protein-calorie malnutrition: Secondary | ICD-10-CM | POA: Diagnosis not present

## 2018-01-31 DIAGNOSIS — G9341 Metabolic encephalopathy: Secondary | ICD-10-CM | POA: Diagnosis not present

## 2018-01-31 DIAGNOSIS — R509 Fever, unspecified: Secondary | ICD-10-CM | POA: Diagnosis not present

## 2018-01-31 DIAGNOSIS — I10 Essential (primary) hypertension: Secondary | ICD-10-CM | POA: Diagnosis not present

## 2018-01-31 DIAGNOSIS — N183 Chronic kidney disease, stage 3 unspecified: Secondary | ICD-10-CM | POA: Diagnosis present

## 2018-01-31 DIAGNOSIS — R778 Other specified abnormalities of plasma proteins: Secondary | ICD-10-CM | POA: Diagnosis present

## 2018-01-31 DIAGNOSIS — N3080 Other cystitis without hematuria: Secondary | ICD-10-CM | POA: Diagnosis not present

## 2018-01-31 DIAGNOSIS — D6959 Other secondary thrombocytopenia: Secondary | ICD-10-CM | POA: Diagnosis not present

## 2018-01-31 DIAGNOSIS — D696 Thrombocytopenia, unspecified: Secondary | ICD-10-CM | POA: Diagnosis present

## 2018-01-31 DIAGNOSIS — C679 Malignant neoplasm of bladder, unspecified: Secondary | ICD-10-CM | POA: Diagnosis present

## 2018-01-31 DIAGNOSIS — N3289 Other specified disorders of bladder: Secondary | ICD-10-CM | POA: Diagnosis present

## 2018-01-31 DIAGNOSIS — I739 Peripheral vascular disease, unspecified: Secondary | ICD-10-CM | POA: Diagnosis not present

## 2018-01-31 DIAGNOSIS — K594 Anal spasm: Secondary | ICD-10-CM | POA: Diagnosis present

## 2018-01-31 DIAGNOSIS — I255 Ischemic cardiomyopathy: Secondary | ICD-10-CM | POA: Diagnosis not present

## 2018-01-31 DIAGNOSIS — C785 Secondary malignant neoplasm of large intestine and rectum: Secondary | ICD-10-CM | POA: Diagnosis not present

## 2018-01-31 DIAGNOSIS — I959 Hypotension, unspecified: Secondary | ICD-10-CM | POA: Diagnosis present

## 2018-01-31 DIAGNOSIS — M545 Low back pain, unspecified: Secondary | ICD-10-CM | POA: Diagnosis present

## 2018-01-31 DIAGNOSIS — Z436 Encounter for attention to other artificial openings of urinary tract: Secondary | ICD-10-CM | POA: Diagnosis not present

## 2018-01-31 DIAGNOSIS — R748 Abnormal levels of other serum enzymes: Secondary | ICD-10-CM

## 2018-01-31 DIAGNOSIS — A419 Sepsis, unspecified organism: Secondary | ICD-10-CM | POA: Diagnosis not present

## 2018-01-31 DIAGNOSIS — T451X5A Adverse effect of antineoplastic and immunosuppressive drugs, initial encounter: Secondary | ICD-10-CM | POA: Diagnosis present

## 2018-01-31 DIAGNOSIS — E871 Hypo-osmolality and hyponatremia: Secondary | ICD-10-CM | POA: Diagnosis not present

## 2018-01-31 DIAGNOSIS — I5021 Acute systolic (congestive) heart failure: Secondary | ICD-10-CM | POA: Diagnosis not present

## 2018-01-31 DIAGNOSIS — R319 Hematuria, unspecified: Secondary | ICD-10-CM | POA: Diagnosis not present

## 2018-01-31 DIAGNOSIS — R0602 Shortness of breath: Secondary | ICD-10-CM | POA: Diagnosis not present

## 2018-01-31 DIAGNOSIS — R410 Disorientation, unspecified: Secondary | ICD-10-CM | POA: Diagnosis not present

## 2018-01-31 DIAGNOSIS — J9601 Acute respiratory failure with hypoxia: Secondary | ICD-10-CM | POA: Insufficient documentation

## 2018-01-31 DIAGNOSIS — D630 Anemia in neoplastic disease: Secondary | ICD-10-CM | POA: Diagnosis not present

## 2018-01-31 DIAGNOSIS — G8929 Other chronic pain: Secondary | ICD-10-CM | POA: Diagnosis present

## 2018-01-31 DIAGNOSIS — I5023 Acute on chronic systolic (congestive) heart failure: Secondary | ICD-10-CM | POA: Diagnosis not present

## 2018-01-31 DIAGNOSIS — D6481 Anemia due to antineoplastic chemotherapy: Secondary | ICD-10-CM | POA: Diagnosis not present

## 2018-01-31 DIAGNOSIS — R1030 Lower abdominal pain, unspecified: Secondary | ICD-10-CM | POA: Diagnosis not present

## 2018-01-31 DIAGNOSIS — N12 Tubulo-interstitial nephritis, not specified as acute or chronic: Secondary | ICD-10-CM | POA: Diagnosis not present

## 2018-01-31 LAB — HEMOGLOBIN AND HEMATOCRIT, BLOOD
HCT: 21.5 % — ABNORMAL LOW (ref 39.0–52.0)
Hemoglobin: 7.4 g/dL — ABNORMAL LOW (ref 13.0–17.0)

## 2018-01-31 LAB — CBC WITH DIFFERENTIAL/PLATELET
BASOS ABS: 0 10*3/uL (ref 0.0–0.1)
Basophils Absolute: 0 10*3/uL (ref 0.0–0.1)
Basophils Relative: 0 %
Basophils Relative: 0 %
EOS ABS: 0 10*3/uL (ref 0.0–0.7)
EOS PCT: 0 %
EOS PCT: 0 %
Eosinophils Absolute: 0 10*3/uL (ref 0.0–0.7)
HCT: 22.3 % — ABNORMAL LOW (ref 39.0–52.0)
HEMATOCRIT: 29.3 % — AB (ref 39.0–52.0)
Hemoglobin: 10.2 g/dL — ABNORMAL LOW (ref 13.0–17.0)
Hemoglobin: 7.8 g/dL — ABNORMAL LOW (ref 13.0–17.0)
LYMPHS ABS: 1.3 10*3/uL (ref 0.7–4.0)
LYMPHS ABS: 1.1 10*3/uL (ref 0.7–4.0)
Lymphocytes Relative: 13 %
Lymphocytes Relative: 10 %
MCH: 30.6 pg (ref 26.0–34.0)
MCH: 31 pg (ref 26.0–34.0)
MCHC: 34.8 g/dL (ref 30.0–36.0)
MCHC: 35 g/dL (ref 30.0–36.0)
MCV: 88 fL (ref 78.0–100.0)
MCV: 88.5 fL (ref 78.0–100.0)
MONO ABS: 0.9 10*3/uL (ref 0.1–1.0)
MONO ABS: 1.4 10*3/uL — AB (ref 0.1–1.0)
Monocytes Relative: 9 %
Monocytes Relative: 13 %
Neutro Abs: 8.1 10*3/uL — ABNORMAL HIGH (ref 1.7–7.7)
Neutro Abs: 8.5 10*3/uL — ABNORMAL HIGH (ref 1.7–7.7)
Neutrophils Relative %: 78 %
Neutrophils Relative %: 77 %
PLATELETS: 14 10*3/uL — AB (ref 150–400)
Platelets: 13 10*3/uL — CL (ref 150–400)
RBC: 3.33 MIL/uL — AB (ref 4.22–5.81)
RBC: 2.52 MIL/uL — AB (ref 4.22–5.81)
RDW: 16.8 % — AB (ref 11.5–15.5)
RDW: 16.8 % — AB (ref 11.5–15.5)
WBC: 10.3 10*3/uL (ref 4.0–10.5)
WBC: 11 10*3/uL — AB (ref 4.0–10.5)

## 2018-01-31 LAB — BLOOD GAS, ARTERIAL
ACID-BASE DEFICIT: 2 mmol/L (ref 0.0–2.0)
Bicarbonate: 21.1 mmol/L (ref 20.0–28.0)
DRAWN BY: 27021
FIO2: 0.21
O2 SAT: 94.9 %
PH ART: 7.437 (ref 7.350–7.450)
Patient temperature: 100.3
pCO2 arterial: 32.2 mmHg (ref 32.0–48.0)
pO2, Arterial: 77.5 mmHg — ABNORMAL LOW (ref 83.0–108.0)

## 2018-01-31 LAB — COMPREHENSIVE METABOLIC PANEL
ALT: 16 U/L — AB (ref 17–63)
AST: 24 U/L (ref 15–41)
Albumin: 3.5 g/dL (ref 3.5–5.0)
Alkaline Phosphatase: 91 U/L (ref 38–126)
Anion gap: 11 (ref 5–15)
BILIRUBIN TOTAL: 0.7 mg/dL (ref 0.3–1.2)
BUN: 37 mg/dL — AB (ref 6–20)
CHLORIDE: 99 mmol/L — AB (ref 101–111)
CO2: 20 mmol/L — ABNORMAL LOW (ref 22–32)
CREATININE: 1.62 mg/dL — AB (ref 0.61–1.24)
Calcium: 8.9 mg/dL (ref 8.9–10.3)
GFR calc Af Amer: 45 mL/min — ABNORMAL LOW (ref >60)
GFR, EST NON AFRICAN AMERICAN: 38 mL/min — AB (ref >60)
Glucose, Bld: 93 mg/dL (ref 65–99)
Potassium: 3.8 mmol/L (ref 3.5–5.1)
Sodium: 130 mmol/L — ABNORMAL LOW (ref 135–145)
Total Protein: 6.7 g/dL (ref 6.5–8.1)

## 2018-01-31 LAB — URINALYSIS, ROUTINE W REFLEX MICROSCOPIC
Bilirubin Urine: NEGATIVE
Glucose, UA: NEGATIVE mg/dL
Ketones, ur: NEGATIVE mg/dL
Nitrite: POSITIVE — AB
Protein, ur: 100 mg/dL — AB
SPECIFIC GRAVITY, URINE: 1.013 (ref 1.005–1.030)
pH: 5 (ref 5.0–8.0)

## 2018-01-31 LAB — I-STAT CG4 LACTIC ACID, ED: LACTIC ACID, VENOUS: 1.26 mmol/L (ref 0.5–1.9)

## 2018-01-31 LAB — TROPONIN I
TROPONIN I: 0.09 ng/mL — AB (ref <0.03)
TROPONIN I: 0.08 ng/mL — AB (ref <0.03)
TROPONIN I: 0.08 ng/mL — AB (ref <0.03)

## 2018-01-31 LAB — I-STAT TROPONIN, ED: Troponin i, poc: 0.09 ng/mL (ref 0.00–0.08)

## 2018-01-31 LAB — INFLUENZA PANEL BY PCR (TYPE A & B)
INFLAPCR: NEGATIVE
Influenza B By PCR: NEGATIVE

## 2018-01-31 LAB — BRAIN NATRIURETIC PEPTIDE: B NATRIURETIC PEPTIDE 5: 1472 pg/mL — AB (ref 0.0–100.0)

## 2018-01-31 MED ORDER — DEXTROSE 5 % IV SOLN: 2.0000 g | INTRAVENOUS | Status: DC

## 2018-01-31 MED ORDER — SODIUM CHLORIDE 0.9 % IV SOLN: 250.0000 mL | INTRAVENOUS | Status: DC | PRN

## 2018-01-31 MED ORDER — DIPHENHYDRAMINE-APAP (SLEEP) 25-500 MG PO TABS: 1.0000 | ORAL_TABLET | Freq: Every evening | ORAL | Status: DC | PRN

## 2018-01-31 MED ORDER — CHLORHEXIDINE GLUCONATE CLOTH 2 % EX PADS: 6.0000 | MEDICATED_PAD | Freq: Once | CUTANEOUS | Status: DC

## 2018-01-31 MED ORDER — SODIUM CHLORIDE 0.9% FLUSH: 5.0000 mL | Freq: Three times a day (TID) | INTRAVENOUS | Status: DC

## 2018-01-31 MED ORDER — CHLORHEXIDINE GLUCONATE CLOTH 2 % EX PADS
6.0000 | MEDICATED_PAD | Freq: Every day | CUTANEOUS | Status: AC
  Administered 2018-01-31 – 2018-02-03 (×4): 6 via TOPICAL

## 2018-01-31 MED ORDER — FUROSEMIDE 10 MG/ML IJ SOLN
40.0000 mg | Freq: Every day | INTRAMUSCULAR | Status: DC
  Administered 2018-01-31 – 2018-02-03 (×4): 40 mg via INTRAVENOUS
  Filled 2018-01-31 (×4): qty 4

## 2018-01-31 MED ORDER — OXYCODONE HCL 5 MG PO TABS
5.0000 mg | ORAL_TABLET | ORAL | Status: DC | PRN
  Administered 2018-01-31 – 2018-02-05 (×16): 5 mg via ORAL
  Filled 2018-01-31 (×16): qty 1

## 2018-01-31 MED ORDER — ACETAMINOPHEN 500 MG PO TABS: 1000.0000 mg | ORAL_TABLET | Freq: Three times a day (TID) | ORAL | Status: DC | PRN

## 2018-01-31 MED ORDER — OXYCODONE HCL ER 10 MG PO T12A: 10.0000 mg | EXTENDED_RELEASE_TABLET | Freq: Two times a day (BID) | ORAL | Status: DC

## 2018-01-31 MED ORDER — ISOSORBIDE MONONITRATE ER 60 MG PO TB24
60.0000 mg | ORAL_TABLET | Freq: Every day | ORAL | Status: DC
  Administered 2018-01-31 – 2018-02-03 (×4): 60 mg via ORAL
  Filled 2018-01-31 (×5): qty 1

## 2018-01-31 MED ORDER — PRAMIPEXOLE DIHYDROCHLORIDE 1 MG PO TABS
1.0000 mg | ORAL_TABLET | Freq: Every day | ORAL | Status: DC
  Administered 2018-01-31 – 2018-02-04 (×5): 1 mg via ORAL
  Filled 2018-01-31 (×5): qty 1

## 2018-01-31 MED ORDER — FUROSEMIDE 10 MG/ML IJ SOLN
40.0000 mg | Freq: Once | INTRAMUSCULAR | Status: AC
  Administered 2018-01-31: 40 mg via INTRAVENOUS
  Filled 2018-01-31: qty 4

## 2018-01-31 MED ORDER — HYDROMORPHONE HCL 1 MG/ML IJ SOLN: 1.0000 mg | Freq: Once | INTRAMUSCULAR | Status: DC

## 2018-01-31 MED ORDER — ADULT MULTIVITAMIN W/MINERALS CH
1.0000 | ORAL_TABLET | Freq: Every day | ORAL | Status: DC
  Administered 2018-01-31 – 2018-02-05 (×6): 1 via ORAL
  Filled 2018-01-31 (×6): qty 1

## 2018-01-31 MED ORDER — SODIUM CHLORIDE 0.9% FLUSH
3.0000 mL | Freq: Two times a day (BID) | INTRAVENOUS | Status: DC
  Administered 2018-01-31 – 2018-02-04 (×10): 3 mL via INTRAVENOUS

## 2018-01-31 MED ORDER — ENSURE ENLIVE PO LIQD
237.0000 mL | Freq: Two times a day (BID) | ORAL | Status: DC
  Administered 2018-01-31 – 2018-02-05 (×10): 237 mL via ORAL

## 2018-01-31 MED ORDER — SODIUM CHLORIDE 0.9% FLUSH
10.0000 mL | INTRAVENOUS | Status: DC | PRN
  Administered 2018-02-01: 20 mL
  Administered 2018-02-03 – 2018-02-05 (×3): 10 mL
  Filled 2018-01-31 (×4): qty 40

## 2018-01-31 MED ORDER — ONDANSETRON HCL 4 MG/2ML IJ SOLN: 4.0000 mg | Freq: Four times a day (QID) | INTRAMUSCULAR | Status: DC | PRN

## 2018-01-31 MED ORDER — CARVEDILOL 6.25 MG PO TABS
6.2500 mg | ORAL_TABLET | Freq: Two times a day (BID) | ORAL | Status: DC
  Administered 2018-01-31 – 2018-02-05 (×10): 6.25 mg via ORAL
  Filled 2018-01-31 (×10): qty 1

## 2018-01-31 MED ORDER — SODIUM CHLORIDE 0.9% FLUSH: 3.0000 mL | INTRAVENOUS | Status: DC | PRN

## 2018-01-31 MED ORDER — POTASSIUM CHLORIDE CRYS ER 20 MEQ PO TBCR
20.0000 meq | EXTENDED_RELEASE_TABLET | Freq: Two times a day (BID) | ORAL | Status: DC
  Administered 2018-01-31 – 2018-02-01 (×4): 20 meq via ORAL
  Filled 2018-01-31 (×4): qty 1

## 2018-01-31 MED ORDER — SODIUM CHLORIDE 0.9 % IV SOLN: Freq: Once | INTRAVENOUS | Status: DC

## 2018-01-31 MED ORDER — OXYCODONE HCL ER 10 MG PO T12A
10.0000 mg | EXTENDED_RELEASE_TABLET | Freq: Two times a day (BID) | ORAL | Status: DC
  Administered 2018-01-31 – 2018-02-05 (×11): 10 mg via ORAL
  Filled 2018-01-31 (×11): qty 1

## 2018-01-31 MED ORDER — MUPIROCIN 2 % EX OINT
1.0000 "application " | TOPICAL_OINTMENT | Freq: Two times a day (BID) | CUTANEOUS | Status: AC
  Administered 2018-01-31 – 2018-02-04 (×10): 1 via NASAL
  Filled 2018-01-31 (×3): qty 22

## 2018-01-31 MED ORDER — TRAZODONE HCL 50 MG PO TABS
50.0000 mg | ORAL_TABLET | Freq: Every evening | ORAL | Status: DC | PRN
  Administered 2018-02-02: 50 mg via ORAL
  Filled 2018-01-31: qty 1

## 2018-01-31 MED ORDER — ACETAMINOPHEN 325 MG PO TABS: 650.0000 mg | ORAL_TABLET | ORAL | Status: DC | PRN

## 2018-01-31 MED ORDER — OXYCODONE-ACETAMINOPHEN 5-325 MG PO TABS
1.0000 | ORAL_TABLET | ORAL | Status: DC | PRN
  Administered 2018-01-31 – 2018-02-05 (×14): 1 via ORAL
  Filled 2018-01-31 (×14): qty 1

## 2018-01-31 MED ORDER — DEXTROSE 5 % IV SOLN
1.0000 g | INTRAVENOUS | Status: DC
  Administered 2018-01-31 – 2018-02-05 (×6): 1 g via INTRAVENOUS
  Filled 2018-01-31 (×6): qty 10

## 2018-01-31 MED ORDER — PROCHLORPERAZINE MALEATE 10 MG PO TABS: 10.0000 mg | ORAL_TABLET | Freq: Four times a day (QID) | ORAL | Status: DC | PRN

## 2018-01-31 MED ORDER — HYDROMORPHONE HCL 1 MG/ML IJ SOLN: 1.0000 mg | INTRAMUSCULAR | Status: DC | PRN

## 2018-01-31 MED ORDER — HYDRALAZINE HCL 25 MG PO TABS
25.0000 mg | ORAL_TABLET | Freq: Three times a day (TID) | ORAL | Status: DC
  Administered 2018-01-31 – 2018-02-03 (×10): 25 mg via ORAL
  Filled 2018-01-31 (×10): qty 1

## 2018-01-31 NOTE — Progress Notes (Signed)
Pt will resume HHRN with [place] at discharge.

## 2018-01-31 NOTE — ED Triage Notes (Addendum)
Pt reports fever throughout the day. Tmax was 101.3. He also reports that when he lays flat, he gets very SOB. He had his nephrostomy tubes changes on Friday and a blood transfusion on Thursday. He is being treated for bladder cancer. Last chemo treatment was last Thursday. He is A&Ox4 in triage. Denies any new pain. No V/D. He does endorse some nausea yesterday that subsided. Wife also reports that patient has been experiencing intermittent episodes where he is confused and doesn't know who she is.

## 2018-01-31 NOTE — ED Notes (Signed)
Date and time results received: 01/31/18 3:33 AM  (use smartphrase ".now" to insert current time)  Test: Platelets Critical Value: 13  Name of Provider Notified: [place]  Orders Received? Or Actions Taken?:

## 2018-01-31 NOTE — Progress Notes (Signed)
Pt with complaint of Right Arm Pain Pt states "It kinda feels like the pain I had when I had one of my heart attacks" Telemetry demonstrates no changes NSR BBB rate in the 70-80s. EKG done. Pain Medication also given per Patient request. MD updated via telephone regarding Pt complaint and Troponin levels. Pt states that pain is resolving.Maintain current plan of care. Wife and Pt updated that MD is aware

## 2018-01-31 NOTE — ED Provider Notes (Signed)
[facility] [facility] DEPT Provider Note   CSN: [id number] Arrival date & time: 01/31/18  0004     History   Chief Complaint Chief Complaint  Patient presents with  . Fever    Cancer Patient  . Shortness of Breath  . Cough    HPI Charles Coffey is a 83 y.o. male.  Patient is an 83 year old male with past medical history including coronary artery disease with CABG in 2002 and subsequent stenting in 2018, ischemic cardiomyopathy, and congestive heart failure.  He also has a history of bladder cancer with bilateral nephrostomy tubes currently undergoing chemotherapy.  He presents this evening with complaints of fever that has occurred intermittently throughout the day today.  He also complains of cough and shortness of breath that is worse when he tries to lie flat.  He reports swelling of his feet.  His wife states that he has been somewhat confused intermittently today as well and seems to correspond with his febrile intervals.  She has been giving him Tylenol with good results.   The history is provided by the patient.  Fever   This is a recurrent problem. The current episode started yesterday. Episode frequency: Intermittently. The problem has not changed since onset.The maximum temperature noted was 101 to 101.9 F. Associated symptoms include congestion. Pertinent negatives include no diarrhea. He has tried nothing for the symptoms.    Past Medical History:  Diagnosis Date  . Aortic atherosclerosis ([place])   . BPH with urinary obstruction   . CAD (coronary artery disease)    a.  MI 1995, CABG x 3 2002 (patient says that he had LIMA and RIMA grafts). b. ETT-Cardiolite (10/15) with EF 48%, apical scar, no ischemia. c. Nuc 07/2017 abnormal -> cath was performed,  patent LIMA to LAD and SVG to OM 2. RIMA to RCA is atretic. The right coronary artery is occluded distally with extensive collaterals from the LAD, medical therapy.   . Cancer [facility])    bladder cancer    . Cervical spondylosis without myelopathy 03-17-202017  . Chronic low back pain    sees Dr. Kary Kos   . GERD (gastroesophageal reflux disease)   . Hiatal hernia   . Hyperlipidemia   . Hypertension   . IBS (irritable bowel syndrome)   . Ischemic cardiomyopathy   . Myocardial infarction ([place])   . RBBB   . Restless legs syndrome   . Statin intolerance   . Syncope    a. in 2015 - no apparent cause, was taking sleep medicine at the time. Cardiac workup unremarkable.  . Tubular adenoma of colon     Patient Active Problem List   Diagnosis Date Noted  . DVT (deep venous thrombosis) ([place]) 01/11/2018  . Acute lower UTI 01/01/2018  . Sepsis secondary to UTI ([place]) 01/01/2018  . Colostomy in place [facility]) 12/04/2017  . Rectal stenosis   . Low vitamin B12 level 11/30/2017  . Irritable bowel syndrome with constipation   . Pyelonephritis 11/28/2017  . Fever 11/28/2017  . Ileus ([place]) 11/28/2017  . Elevated troponin 11/28/2017  . Urothelial carcinoma of bladder ([place]) 11/28/2017  . Dehydration 11/28/2017  . Thrombocytopenia ([place]) 11/28/2017  . Hydronephrosis due to obstructive malignant neoplasm of bladder ([place]) 11/25/2017  . Anemia 09/20/2017  . Stage 3 chronic kidney disease ([place]) 09/20/2017  . Angina pectoris ([place])   . Abnormal stress test   . Cervical spondylosis without myelopathy [id number]  . PAD (peripheral artery disease) ([place]) 03/04/2015  .  Arteriosclerosis of coronary artery 11/01/2014  . Acid reflux 11/01/2014  . H/O male genital system disorder 11/01/2014  . Restless leg 11/01/2014  . Acne erythematosa 11/01/2014  . Syncope 09/24/2014  . Chronic low back pain 11/10/2013  . Cardiomyopathy, ischemic 11/29/2012  . Hyperlipidemia LDL goal <70 10/15/2010  . RESTLESS LEGS SYNDROME 10/15/2010  . Essential hypertension 10/15/2010  . MYOCARDIAL INFARCTION, HX OF 10/15/2010  . Coronary artery disease of native heart with stable angina pectoris ([place]) 10/15/2010  . GERD 10/15/2010   . BENIGN PROSTATIC HYPERTROPHY 10/15/2010  . BENIGN PROSTATIC HYPERTROPHY, WITH OBSTRUCTION 10/15/2010  . COLONIC POLYPS, HX OF 10/15/2010    Past Surgical History:  Procedure Laterality Date  . COLONOSCOPY  10/17/2014   per Dr. Hilarie Fredrickson, tubular adenomas, repeat in 3 yrs   . COLONOSCOPY WITH PROPOFOL N/A 12/01/2017   Procedure: COLONOSCOPY WITH PROPOFOL;  Surgeon: Milus Banister, MD;  Location: [facility] ENDOSCOPY;  Service: Endoscopy;  Laterality: N/A;  . CYSTOSCOPY W/ RETROGRADES Left 11/25/2017   Procedure: CYSTOSCOPY WITH RETROGRADE PYELOGRAM;  Surgeon: Irine Seal, MD;  Location: [facility] ORS;  Service: Urology;  Laterality: Left;  . HEART BYPASS    . IR FLUORO GUIDE CV LINE RIGHT  12/27/2017  . IR NEPHROSTOGRAM LEFT THRU EXISTING ACCESS  12/24/2017  . IR NEPHROSTOMY EXCHANGE LEFT  01/28/2018  . IR NEPHROSTOMY EXCHANGE RIGHT  12/24/2017  . IR NEPHROSTOMY EXCHANGE RIGHT  01/28/2018  . IR NEPHROSTOMY PLACEMENT LEFT  11/28/2017  . IR NEPHROSTOMY PLACEMENT RIGHT  12/03/2017  . IR US GUIDE VASC ACCESS RIGHT  12/27/2017  . LAPAROSCOPY N/A 12/01/2017   Procedure: LAPAROSCOPIC DIVERTING OSTOMY;  Surgeon: Stark Klein, MD;  Location: [facility] ORS;  Service: General;  Laterality: N/A;  . LEFT HEART CATH AND CORS/GRAFTS ANGIOGRAPHY N/A 08/25/2017   Procedure: LEFT HEART CATH AND CORS/GRAFTS ANGIOGRAPHY;  Surgeon: Wellington Hampshire, MD;  Location: [place] CV LAB;  Service: Cardiovascular;  Laterality: N/A;  . LUMBAR FUSION  2003   L3-L4  . PROSTATE SURGERY  06-27-12   per Dr. Roni Bread, had CTT  . TONSILLECTOMY    . TRANSURETHRAL RESECTION OF BLADDER TUMOR N/A 11/25/2017   Procedure: TRANSURETHRAL RESECTION OF BLADDER TUMOR (TURBT);  Surgeon: Irine Seal, MD;  Location: [facility] ORS;  Service: Urology;  Laterality: N/A;       Home Medications    Prior to Admission medications   Medication Sig Start Date End Date Taking? Authorizing Provider  acetaminophen (TYLENOL) 500 MG tablet Take 1,000 mg by mouth every 8  (eight) hours as needed for mild pain or moderate pain.    [provider]  carvedilol (COREG) 6.25 MG tablet Take 1 tablet (6.25 mg total) 2 (two) times daily with a meal by mouth. 11/09/17   Larey Dresser, MD  dicyclomine (BENTYL) 10 MG capsule Take 1-2 by mouth every 6 hours as needed for rectal spasms. 12/29/17   Pyrtle, Lajuan Lines, MD  diphenhydramine-acetaminophen (TYLENOL PM EXTRA STRENGTH) 25-500 MG TABS tablet Take 1 tablet by mouth at bedtime as needed.     [provider]  furosemide (LASIX) 40 MG tablet Take 1 tablet (40 mg total) by mouth daily. 01/05/18   Roxan Hockey, MD  hydrALAZINE (APRESOLINE) 25 MG tablet Take 1 tablet (25 mg total) by mouth 3 (three) times daily. 01/04/18   Roxan Hockey, MD  Hydrocortisone Acetate (HEMORRHOIDAL-HC RE) Place 1 application rectally as needed (hemorrhoids).    [provider]  isosorbide mononitrate (IMDUR) 60 MG 24 hr tablet  Take 1 tablet (60 mg total) by mouth daily. 10/28/17 01/26/18  End, Harrell Gave, MD  lidocaine-prilocaine (EMLA) cream Apply 1 application topically as needed. 12/15/17   Wyatt Portela, MD  linaclotide (LINZESS) 290 MCG CAPS capsule TAKE 1 CAPSULE (290 MCG TOTAL) BY MOUTH DAILY. [place] A MEAL 03/30/17   Pyrtle, Lajuan Lines, MD  Multiple Vitamin (MULTIVITAMIN WITH MINERALS) TABS tablet Take 1 tablet by mouth daily. 01/05/18   Roxan Hockey, MD  nitroGLYCERIN (NITROSTAT) 0.4 MG SL tablet Place 1 tablet (0.4 mg total) under the tongue every 5 (five) minutes as needed. 08/19/17 01/01/18  Charlie Pitter, PA-C  oxyCODONE (OXYCONTIN) 10 mg 12 hr tablet Take 1 tablet (10 mg total) by mouth 2 (two) times daily. 01/07/18   Wyatt Portela, MD  oxyCODONE-acetaminophen (PERCOCET) 10-325 MG tablet Take 1 tablet by mouth every 4 (four) hours as needed for pain. 01/25/18   Wyatt Portela, MD  pramipexole (MIRAPEX) 1 MG tablet Take 1 mg by mouth daily.    [provider]  prochlorperazine (COMPAZINE) 10 MG  tablet Take 1 tablet (10 mg total) by mouth every 6 (six) hours as needed for nausea or vomiting. 12/15/17   Wyatt Portela, MD  ranitidine (ZANTAC) 300 MG tablet TAKE 1 TABLET BY MOUTH TWICE A DAY 10/04/17   Pyrtle, Lajuan Lines, MD  temazepam (RESTORIL) 30 MG capsule Take 1 capsule (30 mg total) by mouth at bedtime as needed for sleep. 12/24/17   [name], MD  Tetrahydroz-Glyc-Hyprom-PEG [place] MAXIMUM REDNESS RELIEF OP) Place 2 drops into both eyes 2 (two) times daily as needed (dry eyes).     [provider]  traZODone (DESYREL) 50 MG tablet Take 1 tablet (50 mg total) by mouth at bedtime as needed for sleep. 01/14/18   [name], MD    Family History Family History  Problem Relation Age of Onset  . Heart disease Mother   . Heart attack Mother   . Aneurysm Father        femoral artery  . Heart disease Brother   . Heart disease Maternal Uncle        x 2  . Colon cancer Neg Hx   . Esophageal cancer Neg Hx   . Pancreatic cancer Neg Hx   . Kidney disease Neg Hx   . Liver disease Neg Hx     Social History Social History   Tobacco Use  . Smoking status: Former Smoker    Types: Cigarettes    Last attempt to quit: 12/28/1978    Years since quitting: 39.1  . Smokeless tobacco: Never Used  Substance Use Topics  . Alcohol use: Yes    Alcohol/week: 0.0 oz    Comment: occ-  . Drug use: No     Allergies   Antihistamines, loratadine-type and Statins   Review of Systems Review of Systems  HENT: Positive for congestion.   Gastrointestinal: Negative for diarrhea.  All other systems reviewed and are negative.    Physical Exam Updated Vital Signs BP 110/74 (BP Location: Right Arm)   Pulse 90   Temp 99.7 F (37.6 C) (Oral)   SpO2 98%   Physical Exam  Constitutional: He is oriented to person, place, and time. He appears well-developed and well-nourished. No distress.  HENT:  Head: Normocephalic and atraumatic.  Mouth/Throat: Oropharynx is clear and moist.    Neck: Normal range of motion. Neck supple.  Cardiovascular: Normal rate and regular rhythm. Exam reveals  no friction rub.  No murmur heard. Pulmonary/Chest: Effort normal and breath sounds normal. No respiratory distress. He has no wheezes. He has no rales.  Abdominal: Soft. Bowel sounds are normal. He exhibits no distension. There is no tenderness.  Musculoskeletal: Normal range of motion. He exhibits no edema.  Neurological: He is alert and oriented to person, place, and time. Coordination normal.  Skin: Skin is warm and dry. He is not diaphoretic.  Nursing note and vitals reviewed.    ED Treatments / Results  Labs (all labs ordered are listed, but only abnormal results are displayed) Labs Reviewed  COMPREHENSIVE METABOLIC PANEL  CBC WITH DIFFERENTIAL/PLATELET  URINALYSIS, ROUTINE W REFLEX MICROSCOPIC  BRAIN NATRIURETIC PEPTIDE  I-STAT CG4 LACTIC ACID, ED  I-STAT TROPONIN, ED    EKG  EKG Interpretation None       Radiology No results found.  Procedures Procedures (including critical care time)  Medications Ordered in ED Medications - No data to display   Initial Impression / Assessment and Plan / ED Course  I have reviewed the triage vital signs and the nursing notes.  Pertinent labs & imaging results that were available during my care of the patient were reviewed by me and considered in my medical decision making (see chart for details).  Patient presents with fever, confusion, and shortness of breath.  He was initially afebrile here, however did spike a fever to 100.6.  Blood cultures were obtained as were urine cultures and laboratory studies.  There is no evidence for pneumonia on his chest x-ray, no UTI on UA, and the source of the fever is so far undetermined.  His shortness of breath could be related to bronchitis or possibly CHF.  He will be given IV Lasix and admitted to the hospitalist service for further workup.  Dr. Hal Hope agrees to admit.  Final  Clinical Impressions(s) / ED Diagnoses   Final diagnoses:  None    ED Discharge Orders    None       Veryl Speak, MD 01/31/18 [phone]

## 2018-01-31 NOTE — Care Management Note (Signed)
Case Management Note  Patient Details  Name: Charles Coffey MRN: [id number] Date of Birth: 01/07/1935  Subjective/Objective:      Pt admitted with cco SOB, fever 102,                Action/Plan:Plan to discharge home with [facility]    Expected Discharge Date:  01/28/18               Expected Discharge Plan:  [place]  In-House Referral:  Clinical Social Work  Discharge planning Services  CM Consult  Post Acute Care Choice:    Choice offered to:  Patient  DME Arranged:    DME Agency:     HH Arranged:  RN [place] Agency:  [place]  Status of Service:  In process, will continue to follow  If discussed at Long Length of Stay Meetings, dates discussed:    Additional CommentsPurcell Mouton, RN 01/31/2018, 12:07 PM

## 2018-01-31 NOTE — Plan of Care (Signed)
  Progressing Education: Knowledge of General Education information will improve 01/31/2018 2320 - Progressing by Talbert Forest, RN Nutrition: Adequate nutrition will be maintained 01/31/2018 2320 - Progressing by Talbert Forest, RN Pain Managment: General experience of comfort will improve 01/31/2018 2320 - Progressing by Talbert Forest, RN Safety: Ability to remain free from injury will improve 01/31/2018 2320 - Progressing by Talbert Forest, RN Skin Integrity: Risk for impaired skin integrity will decrease 01/31/2018 2320 - Progressing by Talbert Forest, RN

## 2018-01-31 NOTE — ED Notes (Signed)
Patient transported to X-ray 

## 2018-01-31 NOTE — ED Notes (Signed)
Troponin 0.09 ng/mL. Dr. Stark Jock and Moss Mc, RN, notified.

## 2018-01-31 NOTE — Progress Notes (Signed)
CRITICAL VALUE ALERT  Critical Value: Trop 0.09  Date & Time Notied: 01/31/18 @ 10:50  Provider Notified: Dr. Shanon Brow @ 11:17  Orders Received/Actions taken: No change in orders at this time.

## 2018-01-31 NOTE — ED Notes (Signed)
ED TO INPATIENT HANDOFF REPORT  Name/Age/Gender Charles Coffey 83 y.o. male  Code Status Code Status History    Date Active Date Inactive Code Status Order ID Comments User Context   11/28/2017 21:36 12/04/2017 20:50 Full Code [id number]  Eugenie Filler, MD ED   11/25/2017 15:10 11/26/2017 16:08 Full Code [id number]  Irine Seal, MD Inpatient   08/25/2017 18:45 08/26/2017 19:06 Full Code [id number]  Wellington Hampshire, MD Inpatient    Advance Directive Documentation     Most Recent Value  Type of Advance Directive  Healthcare Power of Attorney, Living will  Pre-existing out of facility DNR order (yellow form or pink MOST form)  No data  "MOST" Form in Place?  No data      Home/SNF/Other Home  Chief Complaint Fever  Level of Care/Admitting Diagnosis ED Disposition    ED Disposition Condition Comment   Admit  Hospital Area: [facility] [[id number]]  Level of Care: Telemetry [5]  Admit to tele based on following criteria: Monitor for Ischemic changes  Diagnosis: Acute respiratory failure with hypoxia [facility]) [[id number]]  Admitting Physician: Rise Patience [phone]  Attending Physician: Rise Patience [phone]  Estimated length of stay: past midnight tomorrow  Certification:: I certify this patient will need inpatient services for at least 2 midnights  PT Class (Do Not Modify): Inpatient [101]  PT Acc Code (Do Not Modify): Private [1]       Medical History Past Medical History:  Diagnosis Date  . Aortic atherosclerosis ([place])   . BPH with urinary obstruction   . CAD (coronary artery disease)    a.  MI 1995, CABG x 3 2002 (patient says that he had LIMA and RIMA grafts). b. ETT-Cardiolite (10/15) with EF 48%, apical scar, no ischemia. c. Nuc 07/2017 abnormal -> cath was performed,  patent LIMA to LAD and SVG to OM 2. RIMA to RCA is atretic. The right coronary artery is occluded distally with extensive collaterals from the LAD, medical therapy.   . Cancer  [facility])    bladder cancer  . Cervical spondylosis without myelopathy 06-09-202017  . Chronic low back pain    sees Dr. Kary Kos   . GERD (gastroesophageal reflux disease)   . Hiatal hernia   . Hyperlipidemia   . Hypertension   . IBS (irritable bowel syndrome)   . Ischemic cardiomyopathy   . Myocardial infarction ([place])   . RBBB   . Restless legs syndrome   . Statin intolerance   . Syncope    a. in 2015 - no apparent cause, was taking sleep medicine at the time. Cardiac workup unremarkable.  . Tubular adenoma of colon     Allergies Allergies  Allergen Reactions  . Antihistamines, Loratadine-Type Other (See Comments)    Unable to urinate  . Statins Other (See Comments)    liver effects    IV Location/Drains/Wounds Patient Lines/Drains/Airways Status   Active Line/Drains/Airways    Name:   Placement date:   Placement time:   Site:   Days:   Implanted Port 12/27/17 Right Chest   12/27/17    1630    Chest   35   Peripheral IV 01/31/18 Right;Posterior Hand   01/31/18    0208    Hand   less than 1   Nephrostomy Left 10 Fr.   11/28/17    2007    Left   64   Nephrostomy Right 10.2 Fr.   12/24/17    1238  Right   38   Colostomy LUQ   12/01/17    1430    LUQ   61   Incision (Closed) 11/25/17 Penis   11/25/17    1339     67   Incision (Closed) 12/01/17 Abdomen   12/01/17    1450     61   Incision (Closed) 12/03/17 Back Right   12/03/17    0917     59   Incision - 3 Ports Abdomen Right;Upper Right;Medial Right;Lower   12/01/17    1331     61          Labs/Imaging Results for orders placed or performed during the hospital encounter of 01/31/18 (from the past 48 hour(s))  Urinalysis, Routine w reflex microscopic     Status: Abnormal   Collection Time: 01/31/18  1:02 AM  Result Value Ref Range   Color, Urine YELLOW YELLOW   APPearance HAZY (A) CLEAR   Specific Gravity, Urine 1.013 1.005 - 1.030   pH 5.0 5.0 - 8.0   Glucose, UA NEGATIVE NEGATIVE mg/dL   Hgb urine dipstick  MODERATE (A) NEGATIVE   Bilirubin Urine NEGATIVE NEGATIVE   Ketones, ur NEGATIVE NEGATIVE mg/dL   Protein, ur 100 (A) NEGATIVE mg/dL   Nitrite POSITIVE (A) NEGATIVE   Leukocytes, UA SMALL (A) NEGATIVE   RBC / HPF TOO NUMEROUS TO COUNT 0 - 5 RBC/hpf   WBC, UA 6-30 0 - 5 WBC/hpf   Bacteria, UA RARE (A) NONE SEEN   Squamous Epithelial / LPF 0-5 (A) NONE SEEN   Mucus PRESENT     Comment: Performed at [facility], [place] [address] [place], [place] [address]  Comprehensive metabolic panel     Status: Abnormal   Collection Time: 01/31/18  1:53 AM  Result Value Ref Range   Sodium 130 (L) 135 - 145 mmol/L   Potassium 3.8 3.5 - 5.1 mmol/L   Chloride 99 (L) 101 - 111 mmol/L   CO2 20 (L) 22 - 32 mmol/L   Glucose, Bld 93 65 - 99 mg/dL   BUN 37 (H) 6 - 20 mg/dL   Creatinine, Ser 1.62 (H) 0.61 - 1.24 mg/dL   Calcium 8.9 8.9 - 10.3 mg/dL   Total Protein 6.7 6.5 - 8.1 g/dL   Albumin 3.5 3.5 - 5.0 g/dL   AST 24 15 - 41 U/L   ALT 16 (L) 17 - 63 U/L   Alkaline Phosphatase 91 38 - 126 U/L   Total Bilirubin 0.7 0.3 - 1.2 mg/dL   GFR calc non Af Amer 38 (L) >60 mL/min   GFR calc Af Amer 45 (L) >60 mL/min    Comment: (NOTE) The eGFR has been calculated using the CKD EPI equation. This calculation has not been validated in all clinical situations. eGFR's persistently <60 mL/min signify possible Chronic Kidney Disease.    Anion gap 11 5 - 15    Comment: Performed at [facility], [place] [address] [place], [place] [address]  CBC with Differential     Status: Abnormal   Collection Time: 01/31/18  1:53 AM  Result Value Ref Range   WBC 10.3 4.0 - 10.5 K/uL   RBC 3.33 (L) 4.22 - 5.81 MIL/uL   Hemoglobin 10.2 (L) 13.0 - 17.0 g/dL   HCT 29.3 (L) 39.0 - 52.0 %   MCV 88.0 78.0 - 100.0 fL   MCH 30.6 26.0 - 34.0 pg   MCHC 34.8 30.0 - 36.0 g/dL  RDW 16.8 (H) 11.5 - 15.5 %   Platelets 13 (LL) 150 - 400 K/uL    Comment: SPECIMEN CHECKED FOR CLOTS REPEATED TO  VERIFY CRITICAL RESULT CALLED TO, READ BACK BY AND VERIFIED WITH: ALLEN C RN AT 1532 ON 01/31/18 BY OKOYEHJ PLATELET COUNT CONFIRMED BY SMEAR    Neutrophils Relative % 78 %   Lymphocytes Relative 13 %   Monocytes Relative 9 %   Eosinophils Relative 0 %   Basophils Relative 0 %   Neutro Abs 8.1 (H) 1.7 - 7.7 K/uL   Lymphs Abs 1.3 0.7 - 4.0 K/uL   Monocytes Absolute 0.9 0.1 - 1.0 K/uL   Eosinophils Absolute 0.0 0.0 - 0.7 K/uL   Basophils Absolute 0.0 0.0 - 0.1 K/uL   RBC Morphology POLYCHROMASIA PRESENT    WBC Morphology TOXIC GRANULATION     Comment: Performed at [facility], [place] [address], [place], [place] [address]  Brain natriuretic peptide     Status: Abnormal   Collection Time: 01/31/18  1:53 AM  Result Value Ref Range   B Natriuretic Peptide 1,472.0 (H) 0.0 - 100.0 pg/mL    Comment: Performed at [facility], [place] [address] [place], [place] [address]  I-Stat CG4 Lactic Acid, ED     Status: None   Collection Time: 01/31/18  2:09 AM  Result Value Ref Range   Lactic Acid, Venous 1.26 0.5 - 1.9 mmol/L  I-stat troponin, ED     Status: Abnormal   Collection Time: 01/31/18  2:09 AM  Result Value Ref Range   Troponin i, poc 0.09 (HH) 0.00 - 0.08 ng/mL   Comment NOTIFIED PHYSICIAN    Comment 3            Comment: Due to the release kinetics of cTnI, a negative result within the first hours of the onset of symptoms does not rule out myocardial infarction with certainty. If myocardial infarction is still suspected, repeat the test at appropriate intervals.    Dg Chest 2 View  Result Date: 01/31/2018 CLINICAL DATA:  Acute onset of fever and shortness of breath. Nausea. Confusion. EXAM: CHEST  2 VIEW COMPARISON:  Chest radiograph performed 01/03/2018 FINDINGS: The lungs are well-aerated. Vascular congestion is noted. Peribronchial thickening is noted. Mild interstitial edema has improved from the prior study. There is no evidence of pleural  effusion or pneumothorax. The heart is normal in size; the mediastinal contour is within normal limits. A right-sided chest port is noted ending about the distal SVC. No acute osseous abnormalities are seen. IMPRESSION: Vascular congestion. Peribronchial thickening. Mild interstitial edema has improved from the prior study. Electronically Signed   By: Garald Balding M.D.   On: 01/31/2018 01:24    Pending Labs Unresulted Labs (From admission, onward)   Start     Ordered   01/31/18 0406  Influenza panel by PCR (type A & B)  STAT,   STAT     01/31/18 0406      Vitals/Pain Today's Vitals   01/31/18 0017 01/31/18 0019 01/31/18 0221  BP:  110/74 (!) 106/57  Pulse:  90 80  Resp:   15  Temp:  99.7 F (37.6 C) (!) 100.6 F (38.1 C)  TempSrc:  Oral Oral  SpO2:  98% 98%  PainSc: 0-No pain      Isolation Precautions No active isolations  Medications Medications  furosemide (LASIX) injection 40 mg (not administered)    Mobility non-ambulatory

## 2018-01-31 NOTE — H&P (Signed)
History and Physical    Charles Coffey:[id number] DOB: [birth year] DOA: 01/31/2018  PCP: [name], MD  Patient coming from: Home  Chief Complaint: Shortness of breath  HPI: Charles Coffey is a 83 y.o. male with medical history significant of bladder cancer, coronary artery disease, hypertension comes in with with shortness of breath that is worsened after he got a blood transfu at home with his chronic pain meds.  Sion last week.  He says he has had some peripheral edema.  But he is also had some fever up to 102.  His wife reports that he has been very confused on and off more so over the last month or so.  He just was diagnosed with bladder cancer and started on chemo treatment in December 2018.  He has not had any nausea vomiting or diarrhea.  He gets very short of breath at night when he is trying to lie down per his wife.  He denies any chest pain or abdominal pain.  He does have suprapubic pain that is chronic and lower back pain that is all also associated with his bladder cancer.  Wife does not feel like his pain is well controlled.  Patient is referred for admission for probable congestive heart failure exacerbation.  It is noted in the chart that he was hypoxic however cannot find any documented O2 sats that are low.  Review of Systems: As per HPI otherwise 10 point review of systems negative.   Past Medical History:  Diagnosis Date  . Aortic atherosclerosis ([place])   . BPH with urinary obstruction   . CAD (coronary artery disease)    a.  MI 1995, CABG x 3 2002 (patient says that he had LIMA and RIMA grafts). b. ETT-Cardiolite (10/15) with EF 48%, apical scar, no ischemia. c. Nuc 07/2017 abnormal -> cath was performed,  patent LIMA to LAD and SVG to OM 2. RIMA to RCA is atretic. The right coronary artery is occluded distally with extensive collaterals from the LAD, medical therapy.   . Cancer [facility])    bladder cancer  . Cervical spondylosis without myelopathy 06/13/202017  .  Chronic low back pain    sees Dr. Kary Kos   . GERD (gastroesophageal reflux disease)   . Hiatal hernia   . Hyperlipidemia   . Hypertension   . IBS (irritable bowel syndrome)   . Ischemic cardiomyopathy   . Myocardial infarction ([place])   . RBBB   . Restless legs syndrome   . Statin intolerance   . Syncope    a. in 2015 - no apparent cause, was taking sleep medicine at the time. Cardiac workup unremarkable.  . Tubular adenoma of colon     Past Surgical History:  Procedure Laterality Date  . COLONOSCOPY  10/17/2014   per Dr. Hilarie Fredrickson, tubular adenomas, repeat in 3 yrs   . COLONOSCOPY WITH PROPOFOL N/A 12/01/2017   Procedure: COLONOSCOPY WITH PROPOFOL;  Surgeon: Milus Banister, MD;  Location: [facility];  Service: Endoscopy;  Laterality: N/A;  . CYSTOSCOPY W/ RETROGRADES Left 11/25/2017   Procedure: CYSTOSCOPY WITH RETROGRADE PYELOGRAM;  Surgeon: Irine Seal, MD;  Location: [facility] ORS;  Service: Urology;  Laterality: Left;  . HEART BYPASS    . IR FLUORO GUIDE CV LINE RIGHT  12/27/2017  . IR NEPHROSTOGRAM LEFT THRU EXISTING ACCESS  12/24/2017  . IR NEPHROSTOMY EXCHANGE LEFT  01/28/2018  . IR NEPHROSTOMY EXCHANGE RIGHT  12/24/2017  . IR NEPHROSTOMY EXCHANGE RIGHT  01/28/2018  .  IR NEPHROSTOMY PLACEMENT LEFT  11/28/2017  . IR NEPHROSTOMY PLACEMENT RIGHT  12/03/2017  . IR US GUIDE VASC ACCESS RIGHT  12/27/2017  . LAPAROSCOPY N/A 12/01/2017   Procedure: LAPAROSCOPIC DIVERTING OSTOMY;  Surgeon: Stark Klein, MD;  Location: [facility];  Service: General;  Laterality: N/A;  . LEFT HEART CATH AND CORS/GRAFTS ANGIOGRAPHY N/A 08/25/2017   Procedure: LEFT HEART CATH AND CORS/GRAFTS ANGIOGRAPHY;  Surgeon: Wellington Hampshire, MD;  Location: [facility] CV [facility];  Service: Cardiovascular;  Laterality: N/A;  . LUMBAR FUSION  2003   L3-L4  . PROSTATE SURGERY  06-27-12   per Dr. Roni Bread, had CTT  . TONSILLECTOMY    . TRANSURETHRAL RESECTION OF BLADDER TUMOR N/A 11/25/2017   Procedure: TRANSURETHRAL RESECTION OF  BLADDER TUMOR (TURBT);  Surgeon: Irine Seal, MD;  Location: [facility];  Service: Urology;  Laterality: N/A;     reports that he quit smoking about 39 years ago. His smoking use included cigarettes. he has never used smokeless tobacco. He reports that he drinks about 1.2 oz of alcohol per week. He reports that he does not use drugs.  Allergies  Allergen Reactions  . Antihistamines, Loratadine-Type Other (See Comments)    Unable to urinate  . Statins Other (See Comments)    liver effects    Family History  Problem Relation Age of Onset  . Heart attack Mother   . Aneurysm Father        femoral artery  . Heart disease Brother   . Heart disease Maternal Uncle        x 2  . Colon cancer Neg Hx   . Esophageal cancer Neg Hx   . Pancreatic cancer Neg Hx   . Kidney disease Neg Hx   . Liver disease Neg Hx     Prior to Admission medications   Medication Sig Start Date End Date Taking? Authorizing Provider  acetaminophen (TYLENOL) 500 MG tablet Take 1,000 mg by mouth every 8 (eight) hours as needed for mild pain or moderate pain.   Yes [provider]  carvedilol (COREG) 6.25 MG tablet Take 1 tablet (6.25 mg total) 2 (two) times daily with a meal by mouth. 11/09/17  Yes Larey Dresser, MD  dicyclomine (BENTYL) 10 MG capsule Take 1-2 by mouth every 6 hours as needed for rectal spasms. 12/29/17  Yes Pyrtle, Lajuan Lines, MD  diphenhydramine-acetaminophen (TYLENOL PM EXTRA STRENGTH) 25-500 MG TABS tablet Take 1 tablet by mouth at bedtime as needed (pain,sleep).    Yes [provider]  furosemide (LASIX) 40 MG tablet Take 1 tablet (40 mg total) by mouth daily. 01/05/18  Yes Emokpae, Courage, MD  hydrALAZINE (APRESOLINE) 25 MG tablet Take 1 tablet (25 mg total) by mouth 3 (three) times daily. 01/04/18  Yes Emokpae, Courage, MD  Hydrocortisone Acetate (HEMORRHOIDAL-HC RE) Place 1 application rectally as needed (hemorrhoids).   Yes [provider]  lidocaine-prilocaine (EMLA) cream  Apply 1 application topically as needed. 12/15/17  Yes Wyatt Portela, MD  linaclotide (LINZESS) 290 MCG CAPS capsule TAKE 1 CAPSULE (290 MCG TOTAL) BY MOUTH DAILY. [place] A MEAL 03/30/17  Yes Pyrtle, Lajuan Lines, MD  Multiple Vitamin (MULTIVITAMIN WITH MINERALS) TABS tablet Take 1 tablet by mouth daily. 01/05/18  Yes Roxan Hockey, MD  oxyCODONE (OXYCONTIN) 10 mg 12 hr tablet Take 1 tablet (10 mg total) by mouth 2 (two) times daily. 01/07/18  Yes Wyatt Portela, MD  oxyCODONE-acetaminophen (PERCOCET) 10-325 MG tablet Take 1 tablet  by mouth every 4 (four) hours as needed for pain. 01/25/18  Yes Wyatt Portela, MD  pramipexole (MIRAPEX) 1 MG tablet Take 1 mg by mouth daily.   Yes [provider]  prochlorperazine (COMPAZINE) 10 MG tablet Take 1 tablet (10 mg total) by mouth every 6 (six) hours as needed for nausea or vomiting. 12/15/17  Yes Wyatt Portela, MD  ranitidine (ZANTAC) 300 MG tablet TAKE 1 TABLET BY MOUTH TWICE A DAY 10/04/17  Yes Pyrtle, Lajuan Lines, MD  Tetrahydroz-Glyc-Hyprom-PEG (VISINE MAXIMUM REDNESS RELIEF OP) Place 2 drops into both eyes 2 (two) times daily as needed (dry eyes).    Yes [provider]  traZODone (DESYREL) 50 MG tablet Take 1 tablet (50 mg total) by mouth at bedtime as needed for sleep. 01/14/18  Yes [name], MD  isosorbide mononitrate (IMDUR) 60 MG 24 hr tablet Take 1 tablet (60 mg total) by mouth daily. 10/28/17 01/26/18  End, Harrell Gave, MD  nitroGLYCERIN (NITROSTAT) 0.4 MG SL tablet Place 1 tablet (0.4 mg total) under the tongue every 5 (five) minutes as needed. 08/19/17 01/01/18  Dunn, Nedra Hai, PA-C  temazepam (RESTORIL) 30 MG capsule Take 1 capsule (30 mg total) by mouth at bedtime as needed for sleep. Patient not taking: Reported on 01/31/2018 12/24/17   [name], MD    Physical Exam: Vitals:   01/31/18 0221 01/31/18 0514 01/31/18 0534 01/31/18 0759  BP: (!) 106/57 109/68 128/78 112/68  Pulse: 80 89 96 83  Resp: 15 18 20 16     Temp: (!) 100.6 F (38.1 C)  99.4 F (37.4 C) 100.3 F (37.9 C)  TempSrc: Oral  Oral Oral  SpO2: 98% 94% 100% 97%  Weight:   83.8 kg (184 lb 11.9 oz)   Height:   5\' 8"  (1.727 m)       Constitutional: NAD, calm, comfortable Vitals:   01/31/18 0221 01/31/18 0514 01/31/18 0534 01/31/18 0759  BP: (!) 106/57 109/68 128/78 112/68  Pulse: 80 89 96 83  Resp: 15 18 20 16   Temp: (!) 100.6 F (38.1 C)  99.4 F (37.4 C) 100.3 F (37.9 C)  TempSrc: Oral  Oral Oral  SpO2: 98% 94% 100% 97%  Weight:   83.8 kg (184 lb 11.9 oz)   Height:   5\' 8"  (1.727 m)    Eyes: PERRL, lids and conjunctivae normal ENMT: Mucous membranes are moist. Posterior pharynx clear of any exudate or lesions.Normal dentition.  Neck: normal, supple, no masses, no thyromegaly Respiratory: clear to auscultation bilaterally, no wheezing, no crackles. Normal respiratory effort. No accessory muscle use.  Cardiovascular: Regular rate and rhythm, no murmurs / rubs / gallops. No extremity edema. 2+ pedal pulses. No carotid bruits.  Abdomen: no tenderness, no masses palpated. No hepatosplenomegaly. Bowel sounds positive.  Musculoskeletal: no clubbing / cyanosis. No joint deformity upper and lower extremities. Good ROM, no contractures. Normal muscle tone.  Skin: no rashes, lesions, ulcers. No induration Neurologic: CN 2-12 grossly intact. Sensation intact, DTR normal. Strength 5/5 in all 4.  Psychiatric: Normal judgment and insight. Alert and oriented x 3. Normal mood.    Labs on Admission: I have personally reviewed following labs and imaging studies  CBC: Recent Labs  Lab 01/27/18 0811 01/31/18 0153  WBC 14.2* 10.3  NEUTROABS 12.1* 8.1*  HGB  --  10.2*  HCT 20.8* 29.3*  MCV 90.5 88.0  PLT 42* 13*   Basic Metabolic Panel: Recent Labs  Lab 01/27/18 0811 01/31/18 0153  NA  133* 130*  K 3.8 3.8  CL 103 99*  CO2 21* 20*  GLUCOSE 108 93  BUN 36* 37*  CREATININE  --  1.62*  CALCIUM 8.9 8.9   GFR: Estimated  Creatinine Clearance: 38.4 mL/min (A) (by C-G formula based on SCr of 1.62 mg/dL (H)). Liver Function Tests: Recent Labs  Lab 01/27/18 0811 01/31/18 0153  AST 20 24  ALT 10 16*  ALKPHOS 104 91  BILITOT 0.4 0.7  PROT 6.3* 6.7  ALBUMIN 3.3* 3.5   No results for input(s): LIPASE, AMYLASE in the last 168 hours. No results for input(s): AMMONIA in the last 168 hours. Coagulation Profile: No results for input(s): INR, PROTIME in the last 168 hours. Cardiac Enzymes: No results for input(s): CKTOTAL, CKMB, CKMBINDEX, TROPONINI in the last 168 hours. BNP (last 3 results) No results for input(s): PROBNP in the last 8760 hours. HbA1C: No results for input(s): HGBA1C in the last 72 hours. CBG: No results for input(s): GLUCAP in the last 168 hours. Lipid Profile: No results for input(s): CHOL, HDL, LDLCALC, TRIG, CHOLHDL, LDLDIRECT in the last 72 hours. Thyroid Function Tests: No results for input(s): TSH, T4TOTAL, FREET4, T3FREE, THYROIDAB in the last 72 hours. Anemia Panel: No results for input(s): VITAMINB12, FOLATE, FERRITIN, TIBC, IRON, RETICCTPCT in the last 72 hours. Urine analysis:    Component Value Date/Time   COLORURINE YELLOW 01/31/2018 0102   APPEARANCEUR HAZY (A) 01/31/2018 0102   LABSPEC 1.013 01/31/2018 0102   PHURINE 5.0 01/31/2018 0102   GLUCOSEU NEGATIVE 01/31/2018 0102   HGBUR MODERATE (A) 01/31/2018 0102   HGBUR negative 10/15/2010 1251   BILIRUBINUR NEGATIVE 01/31/2018 0102   BILIRUBINUR n 12/01/2016 1521   KETONESUR NEGATIVE 01/31/2018 0102   PROTEINUR 100 (A) 01/31/2018 0102   UROBILINOGEN 0.2 12/01/2016 1521   UROBILINOGEN 0.2 10/15/2010 1251   NITRITE POSITIVE (A) 01/31/2018 0102   LEUKOCYTESUR SMALL (A) 01/31/2018 0102   Sepsis Labs: !!!!!!!!!!!!!!!!!!!!!!!!!!!!!!!!!!!!!!!!!!!! @LABRCNTIP (procalcitonin:4,lacticidven:4) )No results found for this or any previous visit (from the past 240 hour(s)).   Radiological Exams on Admission: Dg Chest 2  View  Result Date: 01/31/2018 CLINICAL DATA:  Acute onset of fever and shortness of breath. Nausea. Confusion. EXAM: CHEST  2 VIEW COMPARISON:  Chest radiograph performed 01/03/2018 FINDINGS: The lungs are well-aerated. Vascular congestion is noted. Peribronchial thickening is noted. Mild interstitial edema has improved from the prior study. There is no evidence of pleural effusion or pneumothorax. The heart is normal in size; the mediastinal contour is within normal limits. A right-sided chest port is noted ending about the distal SVC. No acute osseous abnormalities are seen. IMPRESSION: Vascular congestion. Peribronchial thickening. Mild interstitial edema has improved from the prior study. Electronically Signed   By: Garald Balding M.D.   On: 01/31/2018 01:24    EKG: Independently reviewed.  Right bundle branch block Old chart reviewed Chest x-ray reviewed with mild congestion and edema no focal infiltrate  Assessment/Plan 83 year old male with active bladder cancer on chemotherapy comes in with acute CHF likely from blood transfusion last week Principal Problem:   Acute CHF (congestive heart failure) (HCC)-will not obtain cardiac echo had one on January 02, 2018 showing an EF of 25%.  Serial troponin.  Lasix 40 mg IV every 24 hours.  Oxygen sats are normal at this time.  Monitor O2 sats closely.  Daily weights and accurate I's and O's.  Active Problems:   Acute lower UTI-probable source of fever.  Placed on IV Rocephin.  Obtain urine culture.  Essential hypertension-stable continue home meds   Cardiomyopathy, ischemic-noted   Chronic low back pain-continue home dosing OxyContin 10 mg extended release twice a day and as needed short acting.  Provide IV Dilaudid as needed.  Patient is a little sedated right now so hesitate to increase his chronic oral meds.   PAD (peripheral artery disease) (HCC) stable-   Stage 3 chronic kidney disease (HCC)-stable at baseline   Elevated troponin-may be some  demand ischemia will continue serial troponin.  Patient is currently being medically managed with his coronary artery disease and heart failure   Urothelial carcinoma of bladder (HCC)-noted on active chemo   Thrombocytopenia (HCC)-platelet counts 13 this morning.  There is no active bleeding.  I would repeat a stat CBC right now and recheck platelet count.  If his platelet counts are really below 20 will transfuse.   Colostomy in place (HCC)-noted    DVT prophylaxis: SCDs Code Status: Full Family Communication: Wife Disposition Plan: Per day team Consults called: None Admission status: Admission   Maxi Rodas A MD [facility]  If 7PM-7AM, please contact night-coverage [email or web address] Password [facility]  01/31/2018, 9:14 AM

## 2018-02-01 ENCOUNTER — Other Ambulatory Visit: Payer: Self-pay

## 2018-02-01 DIAGNOSIS — D6959 Other secondary thrombocytopenia: Secondary | ICD-10-CM

## 2018-02-01 DIAGNOSIS — D630 Anemia in neoplastic disease: Secondary | ICD-10-CM

## 2018-02-01 DIAGNOSIS — D6481 Anemia due to antineoplastic chemotherapy: Secondary | ICD-10-CM

## 2018-02-01 DIAGNOSIS — E440 Moderate protein-calorie malnutrition: Secondary | ICD-10-CM

## 2018-02-01 DIAGNOSIS — A419 Sepsis, unspecified organism: Secondary | ICD-10-CM

## 2018-02-01 DIAGNOSIS — Z936 Other artificial openings of urinary tract status: Secondary | ICD-10-CM

## 2018-02-01 DIAGNOSIS — C785 Secondary malignant neoplasm of large intestine and rectum: Secondary | ICD-10-CM

## 2018-02-01 LAB — BPAM PLATELET PHERESIS
BLOOD PRODUCT EXPIRATION DATE: 201902052045
ISSUE DATE / TIME: 201902042258
UNIT TYPE AND RH: 6200

## 2018-02-01 LAB — BASIC METABOLIC PANEL
ANION GAP: 10 (ref 5–15)
BUN: 34 mg/dL — ABNORMAL HIGH (ref 6–20)
CALCIUM: 8.6 mg/dL — AB (ref 8.9–10.3)
CO2: 23 mmol/L (ref 22–32)
CREATININE: 1.61 mg/dL — AB (ref 0.61–1.24)
Chloride: 97 mmol/L — ABNORMAL LOW (ref 101–111)
GFR calc Af Amer: 45 mL/min — ABNORMAL LOW (ref >60)
GFR, EST NON AFRICAN AMERICAN: 39 mL/min — AB (ref >60)
GLUCOSE: 110 mg/dL — AB (ref 65–99)
Potassium: 3.5 mmol/L (ref 3.5–5.1)
Sodium: 130 mmol/L — ABNORMAL LOW (ref 135–145)

## 2018-02-01 LAB — CBC
HCT: 20.6 % — ABNORMAL LOW (ref 39.0–52.0)
Hemoglobin: 7.2 g/dL — ABNORMAL LOW (ref 13.0–17.0)
MCH: 30.8 pg (ref 26.0–34.0)
MCHC: 35 g/dL (ref 30.0–36.0)
MCV: 88 fL (ref 78.0–100.0)
PLATELETS: 29 10*3/uL — AB (ref 150–400)
RBC: 2.34 MIL/uL — ABNORMAL LOW (ref 4.22–5.81)
RDW: 16.8 % — AB (ref 11.5–15.5)
WBC: 10 10*3/uL (ref 4.0–10.5)

## 2018-02-01 LAB — HEMOGLOBIN AND HEMATOCRIT, BLOOD
HCT: 21.6 % — ABNORMAL LOW (ref 39.0–52.0)
HEMATOCRIT: 20.9 % — AB (ref 39.0–52.0)
HEMOGLOBIN: 7.5 g/dL — AB (ref 13.0–17.0)
Hemoglobin: 7.3 g/dL — ABNORMAL LOW (ref 13.0–17.0)

## 2018-02-01 LAB — PREPARE PLATELET PHERESIS: UNIT DIVISION: 0

## 2018-02-01 LAB — PREPARE RBC (CROSSMATCH)

## 2018-02-01 MED ORDER — LINACLOTIDE 145 MCG PO CAPS
290.0000 ug | ORAL_CAPSULE | Freq: Every day | ORAL | Status: DC
  Administered 2018-02-01 – 2018-02-05 (×5): 290 ug via ORAL
  Filled 2018-02-01 (×6): qty 2

## 2018-02-01 MED ORDER — SODIUM CHLORIDE 0.9 % IV SOLN
Freq: Once | INTRAVENOUS | Status: AC
  Administered 2018-02-01: 21:00:00 via INTRAVENOUS

## 2018-02-01 MED ORDER — DICYCLOMINE HCL 10 MG PO CAPS: 10.0000 mg | ORAL_CAPSULE | Freq: Three times a day (TID) | ORAL | Status: DC

## 2018-02-01 MED ORDER — DICYCLOMINE HCL 10 MG PO CAPS
10.0000 mg | ORAL_CAPSULE | Freq: Three times a day (TID) | ORAL | Status: DC
  Administered 2018-02-01 – 2018-02-03 (×8): 10 mg via ORAL
  Filled 2018-02-01 (×9): qty 1

## 2018-02-01 MED ORDER — FUROSEMIDE 10 MG/ML IJ SOLN
40.0000 mg | Freq: Once | INTRAMUSCULAR | Status: AC
  Administered 2018-02-01: 40 mg via INTRAVENOUS
  Filled 2018-02-01: qty 4

## 2018-02-01 NOTE — Evaluation (Signed)
Physical Therapy Evaluation Patient Details Name: Charles Coffey MRN: [id number] DOB: [birth year] Today's Date: 02/01/2018   History of Present Illness  83 y.o. male with PMHx of coronary artery disease, hypertension, Urothelial carcinoma with rectal stenosis, colostomy and R and L nephrostomy drain tubes and admitted for acute CHF.  Clinical Impression  Pt admitted with above diagnosis. Pt currently with functional limitations due to the deficits listed below (see PT Problem List).  Pt will benefit from skilled PT to increase their independence and safety with mobility to allow discharge to the venue listed below.   Pt reports fatigue, generalized weakness, limited endurance and dyspnea are limiting his mobility at this time and agreeable to outpatient PT to improve these.  Pt ambulated around unit slowly but steady and required multiple short standing rest breaks due to mild dyspnea.     Follow Up Recommendations Outpatient PT    Equipment Recommendations  None recommended by PT    Recommendations for Other Services       Precautions / Restrictions Precautions Precautions: Fall Precaution Comments: bil nephrostomy tubes      Mobility  Bed Mobility Overal bed mobility: Needs Assistance Bed Mobility: Supine to Sit     Supine to sit: Supervision        Transfers Overall transfer level: Needs assistance Equipment used: None Transfers: Sit to/from Stand Sit to Stand: Min guard         General transfer comment: min/guard for safety  Ambulation/Gait Ambulation/Gait assistance: Min guard Ambulation Distance (Feet): 400 Feet Assistive device: None Gait Pattern/deviations: Step-through pattern;Decreased stride length Gait velocity: decr   General Gait Details: occasionally utilized hand rails however no UE support required for steadying, multiple short standing rest breaks for dyspnea, SPO2 97% on room air upon returning to room, HR remained in 90s bpm  Stairs             Wheelchair Mobility    Modified Rankin (Stroke Patients Only)       Balance Overall balance assessment: Needs assistance         Standing balance support: No upper extremity supported Standing balance-Leahy Scale: Good                               Pertinent Vitals/Pain Pain Assessment: No/denies pain    Home Living Family/patient expects to be discharged to:: Private residence Living Arrangements: Spouse/significant other Available Help at Discharge: Family;Friend(s) Type of Home: House   Entrance Stairs-Rails: Right   Home Layout: Two level;Able to live on main level with bedroom/bathroom Home Equipment: None      Prior Function Level of Independence: Independent         Comments: pt reports stairs were causing SOB prior to admission, perform 3 steps and then would rest     Hand Dominance        Extremity/Trunk Assessment        Lower Extremity Assessment Lower Extremity Assessment: Overall WFL for tasks assessed       Communication   Communication: No difficulties  Cognition Arousal/Alertness: Awake/alert Behavior During Therapy: WFL for tasks assessed/performed Overall Cognitive Status: Within Functional Limits for tasks assessed                                        General Comments      Exercises  Assessment/Plan    PT Assessment Patient needs continued PT services  PT Problem List Decreased strength;Decreased mobility;Decreased activity tolerance       PT Treatment Interventions DME instruction;Therapeutic activities;Gait training;Therapeutic exercise;Patient/family education;Stair training;Functional mobility training    PT Goals (Current goals can be found in the Care Plan section)  Acute Rehab PT Goals PT Goal Formulation: With patient Time For Goal Achievement: 02/15/18 Potential to Achieve Goals: Good    Frequency Min 3X/week   Barriers to discharge        Co-evaluation                AM-PAC PT "6 Clicks" Daily Activity  Outcome Measure Difficulty turning over in bed (including adjusting bedclothes, sheets and blankets)?: None Difficulty moving from lying on back to sitting on the side of the bed? : None Difficulty sitting down on and standing up from a chair with arms (e.g., wheelchair, bedside commode, etc,.)?: A Little Help needed moving to and from a bed to chair (including a wheelchair)?: A Little Help needed walking in hospital room?: A Little Help needed climbing 3-5 steps with a railing? : A Little 6 Click Score: 20    End of Session   Activity Tolerance: Patient tolerated treatment well Patient left: in chair;with call bell/phone within reach;with family/visitor present;with nursing/sitter in room Nurse Communication: Mobility status PT Visit Diagnosis: Difficulty in walking, not elsewhere classified (R26.2)    Time: 4259-5638 PT Time Calculation (min) (ACUTE ONLY): 24 min   Charges:   PT Evaluation $PT Eval Low Complexity: 1 Low     PT G CodesCarmelia Bake, PT, DPT 02/01/2018 Pager: [phone]  York Ram E 02/01/2018, 11:58 AM

## 2018-02-01 NOTE — Progress Notes (Signed)
IP PROGRESS NOTE  Subjective:   Charles Coffey is known to me with advanced urothelial carcinoma hospitalized on January 31, 2018 after presenting with shortness of breath, altered mental status and found to have a urinary tract infection as well as pancytopenia.  He received chemotherapy on January 20, 2018 and have tolerated it poorly.  He developed a lot of arthralgias related to Neulasta.  No active bleeding noted at this time.  During this hospitalization, he received platelet transfusion and has been receiving intravenous antibiotics.  He is clinically improving with more alertness and no longer reporting shortness of breath.  Objective:  Vital signs in last 24 hours: Temp:  [97.9 F (36.6 C)-99.6 F (37.6 C)] 97.9 F (36.6 C) (02/05 1217) Pulse Rate:  [63-86] 86 (02/05 1217) Resp:  [18-20] 18 (02/05 1217) BP: (97-118)/(56-74) 106/61 (02/05 1535) SpO2:  [94 %-100 %] 97 % (02/05 1217) Weight:  [184 lb 11.9 oz (83.8 kg)] 184 lb 11.9 oz (83.8 kg) (02/05 0500) Weight change: 0 lb (0 kg) Last BM Date: 01/31/18(colostomy )  Intake/Output from previous day: 02/04 0701 - 02/05 0700 In: 818 [P.O.:480; Blood:288; IV Piggyback:50] Out: 1790 [Urine:1790] Awake alert gentleman appeared without distress.   Mouth: mucous membranes moist, pharynx normal without lesions Resp: clear to auscultation bilaterally Cardio: regular rate and rhythm, S1, S2 normal, no murmur, click, rub or gallop GI: soft, non-tender; bowel sounds normal; no masses,  no organomegaly Musculoskeletal: No joint deformity or effusion. Neurological: No deficits motor, sensory or deep tendon reflexes.  Mentation is intact.  Portacath in place without complications.  Lab Results: Recent Labs    01/31/18 0954  02/01/18 0253 02/01/18 1311  WBC 11.0*  --  10.0  --   HGB 7.8*   < > 7.2* 7.5*  HCT 22.3*   < > 20.6* 21.6*  PLT 14*  --  29*  --    < > = values in this interval not displayed.    BMET Recent Labs   01/31/18 0153 02/01/18 0253  NA 130* 130*  K 3.8 3.5  CL 99* 97*  CO2 20* 23  GLUCOSE 93 110*  BUN 37* 34*  CREATININE 1.62* 1.61*  CALCIUM 8.9 8.6*    Studies/Results: Dg Chest 2 View  Result Date: 01/31/2018 CLINICAL DATA:  Acute onset of fever and shortness of breath. Nausea. Confusion. EXAM: CHEST  2 VIEW COMPARISON:  Chest radiograph performed 01/03/2018 FINDINGS: The lungs are well-aerated. Vascular congestion is noted. Peribronchial thickening is noted. Mild interstitial edema has improved from the prior study. There is no evidence of pleural effusion or pneumothorax. The heart is normal in size; the mediastinal contour is within normal limits. A right-sided chest port is noted ending about the distal SVC. No acute osseous abnormalities are seen. IMPRESSION: Vascular congestion. Peribronchial thickening. Mild interstitial edema has improved from the prior study. Electronically Signed   By: Garald Balding M.D.   On: 01/31/2018 01:24    Medications: I have reviewed the patient's current medications.  Assessment/Plan:  83 year old gentleman with the following issues:  1.  Advanced urothelial carcinoma with documented local invasion into the rectum as well as lymphadenopathy.  He is receiving palliative chemotherapy with gemcitabine and carboplatin.  His next chemotherapy cycle will be in 3 weeks.  By that time reasonable recovery is anticipated.  2.  Urinary sepsis: He has percutaneous nephrostomy tubes makes him susceptible to urinary tract infection.  He is currently receiving appropriate antibiotics.  3.  Thrombocytopenia: Related to chemotherapy.  Platelet transfusion is improved his platelet count to 29 and does not require any further transfusion at this time.  4.  Anemia: Related to malignancy as well as chemotherapy.  Packed red cell transfusion would be beneficial given his cardiovascular disease.  5.  Prognosis: Remains guarded.  He has an aggressive malignancy without  chemotherapy will continue to progress and would be rather debilitating.  He still desires aggressive therapy but understands that if his tumor continues to progress, chemotherapy will be suspended at the time.  15  minutes was spent with the patient face-to-face today.  More than 50% of time was dedicated to patient counseling, education and answering multitude of questions by the patient and his family.   LOS: 1 day   Zola Button 02/01/2018, 4:11 PM

## 2018-02-01 NOTE — Progress Notes (Signed)
PROGRESS NOTE    Charles Coffey  YKD:[id number] DOB: 07-04-1935 DOA: 01/31/2018 PCP: [name], MD   Brief Narrative:  Charles Coffey is Charles Coffey 83 y.o. male with medical history significant of bladder cancer, coronary artery disease, hypertension comes in with with shortness of breath that is worsened after he got Charles Coffey blood transfu at home with his chronic pain meds.  Sion last week.  He says he has had some peripheral edema.  But he is also had some fever up to 102.  His wife reports that he has been very confused on and off more so over the last month or so.  He just was diagnosed with bladder cancer and started on chemo treatment in December 2018.  He has not had any nausea vomiting or diarrhea.  He gets very short of breath at night when he is trying to lie down per his wife.  He denies any chest pain or abdominal pain.  He does have suprapubic pain that is chronic and lower back pain that is all also associated with his bladder cancer.  Wife does not feel like his pain is well controlled.  Patient is referred for admission for probable congestive heart failure exacerbation.  It is noted in the chart that he was hypoxic however cannot find any documented O2 sats that are low.  Assessment & Plan:   Principal Problem:   Acute CHF (congestive heart failure) (HCC) Active Problems:   Essential hypertension   Cardiomyopathy, ischemic   Chronic low back pain   PAD (peripheral artery disease) (HCC)   Stage 3 chronic kidney disease (HCC)   Elevated troponin   Urothelial carcinoma of bladder (HCC)   Thrombocytopenia (HCC)   Colostomy in place [facility])   Acute lower UTI   Malnutrition of moderate degree  Pyelonephritis:  Pt with fever, and dirty UA with nitrites.  Suspect this is cause of fevers.  Continue ceftriaxone -> plan for 2 week course of abx (discussed with urology given nephrostomy tubes) Unfortunately urine culture not collected Will order new urine culture, though this is after abx  (may need to reference previous cx results if this is negative)  HFrEF Exacerbation: echo 01/02/18 with EF 25%.  Presumed ischemic cardiomyopathy, per cards, not good candidate for cath given his comorbidities (01/04/18 c/s note).  Serial trops flat and repeat EKG today stable.  On room air.  Seems to have improved from yesterday. CXR from 2/5 with vascular congestion, peribronchial thickening, mild interstitial edema improved from prior study I/O Daily weights Lasix 40 IV daily (on 40 PO at home)  [facility]   01/31/18 0534 02/01/18 0500  Weight: 83.8 kg (184 lb 11.9 oz) 83.8 kg (184 lb 11.9 oz)   Elevated troponin- flat and stable EKG.  Continue to monitor as above.   Stage III CKD: continue to monitor with diuresis.  Baseline creatinine fluctuating from about 1.4-1.6 recently.   Urothelial carcinoma of bladder: receiving palliative chemotherapy with gemictabine/carboplatin.  Dr. [name] following.  Added to treatment team.  He's s/p bilateral nephrostomy tube placement and diverting colostomy.    Essential hypertension- coreg, hydralazine, imdur, lasix  Hyponatremia: mild, continue to monitor  Chronic low back pain-continue home dosing OxyContin 10 mg extended release twice Karuna Balducci day and as needed short acting.   PAD (peripheral artery disease) (HCC) stable-  Anemia  Thrombocytopenia: Followed by oncology and related to chemotherapy.  s/p platelet transfusion yesterday.  Will transfuse 1 unit of pRBC given his CAD and give  lasix after transfusion.  Continue to follow  DVT prophylaxis: SCD Code Status: full  Family Communication: wife at bedside Disposition Plan: pending improvement   Consultants:   Dr. [name], oncology  Procedures:   none  Antimicrobials: Anti-infectives (From admission, onward)   Start     Dose/Rate Route Frequency Ordered Stop   01/31/18 1000  cefTRIAXone (ROCEPHIN) 1 g in dextrose 5 % 50 mL IVPB     1 g 100 mL/hr over 30 Minutes Intravenous Every 24  hours 01/31/18 0911     01/31/18 0647  cefoTEtan (CEFOTAN) 2 g in dextrose 5 % 50 mL IVPB  Status:  Discontinued     2 g 100 mL/hr over 30 Minutes Intravenous On call to O.R. 01/31/18 0647 01/31/18 0735     Subjective: Last Thursday had blood transfusion. Friday tubes exchanged.  Chronic R>L flank pain for weeks-months.  Friday night mild fever and lethargy.  Sat confused.  Sunday temp 101.3 and confused.   Objective: Vitals:   02/01/18 0619 02/01/18 1105 02/01/18 1217 02/01/18 1535  BP: 108/67 118/74 102/67 106/61  Pulse: 78  86   Resp:   18   Temp: 98.1 F (36.7 C)  97.9 F (36.6 C)   TempSrc: Oral  Oral   SpO2: 97%  97%   Weight:      Height:        Intake/Output Summary (Last 24 hours) at 02/01/2018 1740 Last data filed at 02/01/2018 1430 Gross per 24 hour  Intake 733 ml  Output 590 ml  Net 143 ml   Filed Weights   01/31/18 0534 02/01/18 0500  Weight: 83.8 kg (184 lb 11.9 oz) 83.8 kg (184 lb 11.9 oz)    Examination:  General exam: Appears calm and comfortable  Respiratory system: Clear to auscultation. Respiratory effort normal. Cardiovascular system: S1 & S2 heard, RRR. No JVD, murmurs, rubs, gallops or clicks. No pedal edema. Gastrointestinal system: Abdomen is nondistended, soft and nontender. No organomegaly or masses felt. Colostomy. Bilateral nephrostomy tubes.  Central nervous system: Alert and oriented. No focal neurological deficits. Extremities: trace LEE, some dependent edema ~1+ Skin: No rashes, lesions or ulcers Psychiatry: Judgement and insight appear normal. Mood & affect appropriate.     Data Reviewed: I have personally reviewed following labs and imaging studies  CBC: Recent Labs  Lab 01/27/18 0811 01/31/18 0153 01/31/18 0954 01/31/18 2043 02/01/18 0253 02/01/18 1311  WBC 14.2* 10.3 11.0*  --  10.0  --   NEUTROABS 12.1* 8.1* 8.5*  --   --   --   HGB  --  10.2* 7.8* 7.4* 7.2* 7.5*  HCT 20.8* 29.3* 22.3* 21.5* 20.6* 21.6*  MCV 90.5  88.0 88.5  --  88.0  --   PLT 42* 13* 14*  --  29*  --    Basic Metabolic Panel: Recent Labs  Lab 01/27/18 0811 01/31/18 0153 02/01/18 0253  NA 133* 130* 130*  K 3.8 3.8 3.5  CL 103 99* 97*  CO2 21* 20* 23  GLUCOSE 108 93 110*  BUN 36* 37* 34*  CREATININE 1.42* 1.62* 1.61*  CALCIUM 8.9 8.9 8.6*   GFR: Estimated Creatinine Clearance: 38.6 mL/min (Denice Cardon) (by C-G formula based on SCr of 1.61 mg/dL (H)). Liver Function Tests: Recent Labs  Lab 01/27/18 0811 01/31/18 0153  AST 20 24  ALT 10 16*  ALKPHOS 104 91  BILITOT 0.4 0.7  PROT 6.3* 6.7  ALBUMIN 3.3* 3.5   No results for input(s): LIPASE, AMYLASE in  the last 168 hours. No results for input(s): AMMONIA in the last 168 hours. Coagulation Profile: No results for input(s): INR, PROTIME in the last 168 hours. Cardiac Enzymes: Recent Labs  Lab 01/31/18 0954 01/31/18 1511 01/31/18 2043  TROPONINI 0.09* 0.08* 0.08*   BNP (last 3 results) No results for input(s): PROBNP in the last 8760 hours. HbA1C: No results for input(s): HGBA1C in the last 72 hours. CBG: No results for input(s): GLUCAP in the last 168 hours. Lipid Profile: No results for input(s): CHOL, HDL, LDLCALC, TRIG, CHOLHDL, LDLDIRECT in the last 72 hours. Thyroid Function Tests: No results for input(s): TSH, T4TOTAL, FREET4, T3FREE, THYROIDAB in the last 72 hours. Anemia Panel: No results for input(s): VITAMINB12, FOLATE, FERRITIN, TIBC, IRON, RETICCTPCT in the last 72 hours. Sepsis Labs: Recent Labs  Lab 01/31/18 0209  LATICACIDVEN 1.26    No results found for this or any previous visit (from the past 240 hour(s)).       Radiology Studies: Dg Chest 2 View  Result Date: 01/31/2018 CLINICAL DATA:  Acute onset of fever and shortness of breath. Nausea. Confusion. EXAM: CHEST  2 VIEW COMPARISON:  Chest radiograph performed 01/03/2018 FINDINGS: The lungs are well-aerated. Vascular congestion is noted. Peribronchial thickening is noted. Mild interstitial  edema has improved from the prior study. There is no evidence of pleural effusion or pneumothorax. The heart is normal in size; the mediastinal contour is within normal limits. Devetta Hagenow right-sided chest port is noted ending about the distal SVC. No acute osseous abnormalities are seen. IMPRESSION: Vascular congestion. Peribronchial thickening. Mild interstitial edema has improved from the prior study. Electronically Signed   By: Garald Balding M.D.   On: 01/31/2018 01:24        Scheduled Meds: . carvedilol  6.25 mg Oral BID WC  . Chlorhexidine Gluconate Cloth  6 each Topical Q0600  . dicyclomine  10 mg Oral TID AC & HS  . feeding supplement (ENSURE ENLIVE)  237 mL Oral BID BM  . furosemide  40 mg Intravenous Daily  . furosemide  40 mg Intravenous Once  . hydrALAZINE  25 mg Oral TID  . isosorbide mononitrate  60 mg Oral Daily  . linaclotide  290 mcg Oral QAC breakfast  . multivitamin with minerals  1 tablet Oral Daily  . mupirocin ointment  1 application Nasal BID  . oxyCODONE  10 mg Oral BID  . potassium chloride  20 mEq Oral BID  . pramipexole  1 mg Oral Daily  . sodium chloride flush  3 mL Intravenous Q12H   Continuous Infusions: . sodium chloride    . sodium chloride    . sodium chloride    . sodium chloride    . cefTRIAXone (ROCEPHIN)  IV Stopped (02/01/18 1138)     LOS: 1 day    Time spent: over 51 min    Fayrene Helper, MD [facility] Pager [phone]  If 7PM-7AM, please contact night-coverage [email or web address] Password [email or web address] 02/01/2018, 5:40 PM

## 2018-02-01 NOTE — Progress Notes (Signed)
Medications administered by student RN 0700-1700 with supervision of Clinical Instructor Myrissa Chipley MSN, RN-BC or patient's assigned RN.   

## 2018-02-01 NOTE — Progress Notes (Signed)
Initial Nutrition Assessment  DOCUMENTATION CODES:   Not applicable, Non-severe (moderate) malnutrition in context of chronic illness  INTERVENTION:    Ensure Enlive po BID, each supplement provides 350 kcal and 20 grams of protein  Provide MVI daily  NUTRITION DIAGNOSIS:   Moderate Malnutrition related to chronic illness, cancer and cancer related treatments as evidenced by energy intake < 75% for > or equal to 1 month, moderate muscle depletion, moderate fat depletion.  GOAL:   Patient will meet greater than or equal to 90% of their needs  MONITOR:   PO intake, Supplement acceptance, Weight trends, Labs  REASON FOR ASSESSMENT:   Malnutrition Screening Tool    ASSESSMENT:   Pt with PMH significant for bladder cancer currently on chemotherapy, CAD s/p CABG, HTN, and HLD. Presents this admission with acute CHF likely from blood transfusion last week.     Spoke with pt and wife at bedside. Reports having loss in appetite for > 3 months related to taste changes from chemotherapy. Wife cooks meals, pt will take a couple bites and lose his appetite. States he has been drinking muscle milk twice per day to prevent any further loss of muscle mass. RD observed pt eating roast beef and mashed potatoes. Pt reports he would rather drink Ensure than eat real food. Suggested pt to eat as much as he could and drink Ensure to supplement. RD to order.   Pt knows he has lost weight but states its hard to quantify given his fluid accumulation. Records indicate pt weighed 215 lb in April 2018 and 184 lb this stay. This is significant but hard to determine how much is dry weight. Suspect recent dry weight loss. Will monitor trends while in the hospital. Nutrition-Focused physical exam completed.   Medications reviewed and include: 40 mg lasix, MVI with minerals, 20 mEq KCl, IV abx Labs reviewed: Na 130 (L)   NUTRITION - FOCUSED PHYSICAL EXAM:    Most Recent Value  Orbital Region  Mild  depletion  Upper Arm Region  Unable to assess  Thoracic and Lumbar Region  Moderate depletion  Buccal Region  Moderate depletion  Temple Region  Mild depletion  Clavicle Bone Region  Moderate depletion  Clavicle and Acromion Bone Region  Moderate depletion  Scapular Bone Region  Unable to assess  Dorsal Hand  Mild depletion  Patellar Region  Moderate depletion  Anterior Thigh Region  Moderate depletion  Posterior Calf Region  Moderate depletion  Edema (RD Assessment)  Mild  Hair  Reviewed  Eyes  Reviewed  Mouth  Reviewed  Skin  Reviewed  Nails  Reviewed     Diet Order:  Diet Heart Room service appropriate? Yes; Fluid consistency: Thin  EDUCATION NEEDS:   Education needs have been addressed  Skin:  Skin Assessment: Reviewed RN Assessment  Last BM:  01/31/18  Height:   Ht Readings from Last 1 Encounters:  01/31/18 5\' 8"  (1.727 m)    Weight:   Wt Readings from Last 1 Encounters:  02/01/18 184 lb 11.9 oz (83.8 kg)    Ideal Body Weight:  70 kg  BMI:  Body mass index is 28.09 kg/m.  Estimated Nutritional Needs:   Kcal:  2200-2400 kcal/day  Protein:  110-120 kcal/day  Fluid:  >2.2 L/day    Mariana Single RD, LDN Clinical Nutrition Pager # [phone]

## 2018-02-02 ENCOUNTER — Encounter: Payer: Self-pay | Admitting: General Practice

## 2018-02-02 DIAGNOSIS — Z933 Colostomy status: Secondary | ICD-10-CM

## 2018-02-02 DIAGNOSIS — N183 Chronic kidney disease, stage 3 (moderate): Secondary | ICD-10-CM

## 2018-02-02 DIAGNOSIS — D696 Thrombocytopenia, unspecified: Secondary | ICD-10-CM

## 2018-02-02 DIAGNOSIS — I5021 Acute systolic (congestive) heart failure: Secondary | ICD-10-CM

## 2018-02-02 DIAGNOSIS — C679 Malignant neoplasm of bladder, unspecified: Secondary | ICD-10-CM

## 2018-02-02 DIAGNOSIS — N390 Urinary tract infection, site not specified: Secondary | ICD-10-CM

## 2018-02-02 DIAGNOSIS — I10 Essential (primary) hypertension: Secondary | ICD-10-CM

## 2018-02-02 DIAGNOSIS — I255 Ischemic cardiomyopathy: Secondary | ICD-10-CM

## 2018-02-02 LAB — BASIC METABOLIC PANEL
Anion gap: 10 (ref 5–15)
BUN: 37 mg/dL — AB (ref 6–20)
CALCIUM: 8.7 mg/dL — AB (ref 8.9–10.3)
CO2: 25 mmol/L (ref 22–32)
CREATININE: 1.58 mg/dL — AB (ref 0.61–1.24)
Chloride: 99 mmol/L — ABNORMAL LOW (ref 101–111)
GFR calc Af Amer: 46 mL/min — ABNORMAL LOW (ref >60)
GFR, EST NON AFRICAN AMERICAN: 40 mL/min — AB (ref >60)
GLUCOSE: 117 mg/dL — AB (ref 65–99)
Potassium: 3.4 mmol/L — ABNORMAL LOW (ref 3.5–5.1)
Sodium: 134 mmol/L — ABNORMAL LOW (ref 135–145)

## 2018-02-02 LAB — CBC
HCT: 22.7 % — ABNORMAL LOW (ref 39.0–52.0)
Hemoglobin: 7.9 g/dL — ABNORMAL LOW (ref 13.0–17.0)
MCH: 30.9 pg (ref 26.0–34.0)
MCHC: 34.8 g/dL (ref 30.0–36.0)
MCV: 88.7 fL (ref 78.0–100.0)
Platelets: 33 10*3/uL — ABNORMAL LOW (ref 150–400)
RBC: 2.56 MIL/uL — ABNORMAL LOW (ref 4.22–5.81)
RDW: 16.3 % — ABNORMAL HIGH (ref 11.5–15.5)
WBC: 9.6 10*3/uL (ref 4.0–10.5)

## 2018-02-02 LAB — BPAM RBC
Blood Product Expiration Date: 201902162359
ISSUE DATE / TIME: 201902052101
Unit Type and Rh: 6200

## 2018-02-02 LAB — MAGNESIUM: MAGNESIUM: 1.9 mg/dL (ref 1.7–2.4)

## 2018-02-02 LAB — TYPE AND SCREEN
ABO/RH(D): A POS
Antibody Screen: NEGATIVE
Unit division: 0

## 2018-02-02 MED ORDER — POTASSIUM CHLORIDE CRYS ER 20 MEQ PO TBCR
30.0000 meq | EXTENDED_RELEASE_TABLET | Freq: Two times a day (BID) | ORAL | Status: DC
  Administered 2018-02-02 – 2018-02-05 (×7): 30 meq via ORAL
  Filled 2018-02-02 (×7): qty 1

## 2018-02-02 NOTE — Progress Notes (Signed)
Physical Therapy Treatment and Discharge Patient Details Name: Charles Coffey MRN: [id number] DOB: 08/13/1935 Today's Date: 02/02/2018    History of Present Illness 83 y.o. male with PMHx of coronary artery disease, hypertension, Urothelial carcinoma with rectal stenosis, colostomy and R and L nephrostomy drain tubes and admitted for acute CHF.    PT Comments    Pt happy to ambulate and discussed his plan to return home, stop treatments, and spend time with his wife.  Pt tolerated improved distance and no SOB reported today.  Pt and spouse asking questions about lifting weights and therapist redirected conversion to more quality of life and just maintaining daily abilities and goals (based on pt's statements during ambulation).  Pt also wondering about stairs in his home and therapist recommended he avoid if possible just for safety, however to have a second person around to assist if needed if he feels a strong desire to get upstairs.  (His office was upstairs however has been moved to main level.)  Pt thankful for visit and hopeful for d/c home tomorrow.    Follow Up Recommendations  Other (comment)(pt per preference, discussed OP yesterday however pt reports stopping cancer treatments today)     Equipment Recommendations  None recommended by PT    Recommendations for Other Services       Precautions / Restrictions Precautions Precautions: Fall Precaution Comments: bil nephrostomy tubes    Mobility  Bed Mobility Overal bed mobility: Modified Independent                Transfers Overall transfer level: Needs assistance Equipment used: None Transfers: Sit to/from Stand Sit to Stand: Supervision;Modified independent (Device/Increase time)            Ambulation/Gait Ambulation/Gait assistance: Supervision;Modified independent (Device/Increase time) Ambulation Distance (Feet): 800 Feet Assistive device: None Gait Pattern/deviations: Step-through pattern;Decreased  stride length     General Gait Details: slow but steady pace, HR in 90s bpm with ambulation, pt denies SOB today and tolerated increased distance well   Stairs            Wheelchair Mobility    Modified Rankin (Stroke Patients Only)       Balance                                            Cognition Arousal/Alertness: Awake/alert Behavior During Therapy: WFL for tasks assessed/performed Overall Cognitive Status: Within Functional Limits for tasks assessed                                        Exercises      General Comments        Pertinent Vitals/Pain Pain Assessment: No/denies pain    Home Living                      Prior Function            PT Goals (current goals can now be found in the care plan section) Progress towards PT goals: Goals met/education completed, patient discharged from PT    Frequency    Min 3X/week      PT Plan Other (comment)(d/c from acute PT)    Co-evaluation              AM-PAC  PT "6 Clicks" Daily Activity  Outcome Measure  Difficulty turning over in bed (including adjusting bedclothes, sheets and blankets)?: None Difficulty moving from lying on back to sitting on the side of the bed? : None Difficulty sitting down on and standing up from a chair with arms (e.g., wheelchair, bedside commode, etc,.)?: None Help needed moving to and from a bed to chair (including a wheelchair)?: None Help needed walking in hospital room?: None Help needed climbing 3-5 steps with a railing? : A Little 6 Click Score: 23    End of Session   Activity Tolerance: Patient tolerated treatment well Patient left: with call bell/phone within reach;with family/visitor present;with nursing/sitter in room;in bed   PT Visit Diagnosis: Difficulty in walking, not elsewhere classified (R26.2)     Time: 0165-5374 PT Time Calculation (min) (ACUTE ONLY): 18 min  Charges:  $Gait Training: 8-22  mins                    G Codes:      Carmelia Bake, PT, DPT 02/02/2018 Pager: [phone]  York Ram E 02/02/2018, 2:00 PM

## 2018-02-02 NOTE — Progress Notes (Signed)
[place] Spiritual Care Note  I have been following Charles Coffey and wife Charles Coffey, particularly re caregiver distress, at [facility] Per [name]'s request, I met with couple at pt's bedside to help them to process their discernment about tx plans and goals.  Per Charles Coffey, his top priority is having enough time, energy, and mental clarity to manage family finances and logistics such that Charles Coffey and their children can understand and take over. For Charles Coffey, this is a spiritual/emotional need because such provision reflects his value system and love language; for him, organizing and communicating financial details is a matter of personal integrity.  Per Charles Coffey, his primary concern is that each chemo cycle is leaving him progressively further from his baseline mental acuity, short-term memory, and energy level. He worries that continuing with chemo will hasten his fogginess and his death.  At the same time, Charles Coffey is concerned about how much and how quickly his pain-management regimen may compromise his mental acuity.   Couple plans to discuss these concerns with Dr [name] for advice about factors to consider and next steps. At couple's request, I would be glad to join them for such a conversation if schedules allow.  Following for support, but please also page if needs arise or circumstances change. Thank you!   [place], [place], [facility] M-F daytime pager [phone] [facility] 24/7 pager [phone] Voicemail [phone]

## 2018-02-02 NOTE — Progress Notes (Signed)
PROGRESS NOTE  ZEBEDIAH BEEZLEY  GXQ:[id number] DOB: 1935-11-25 DOA: 01/31/2018 PCP: [name], MD  Brief Narrative:   Marcello Fennel Singeris a 83 y.o.malewith medical history significant ofbladder cancer, coronary artery disease, hypertension who presented with shortness of breath that worsened after he got a blood transfusion last week. He also had some fever up to 102. His wife reports that he has been very confused on and off more so over the last month or so. He just was diagnosed with bladder cancer and started on chemo treatment in December 2018. He does have suprapubic pain that is chronic and lower back pain that is all also associated with his bladder cancer. Wife does not feel like his pain is well controlled. Patient is referred for admission for probable congestive heart failure exacerbation. It is noted in the chart that he was hypoxic however cannot find any documented O2 sats that are low.  Assessment & Plan:  Pyelonephritis:  Pt with fever, and UA with nitrites, hematuria and WBC 6-30 -  F/u urine culture -  Continue ceftriaxone -> plan for 2 week course of abx (discussed with urology given nephrostomy tubes)  Metabolic encephalopathy, starting to improve according to wife -  Continue antibiotics and diuresis  HFrEF Exacerbation: echo 01/02/18 with EF 25%.  Presumed ischemic cardiomyopathy, per cards, not good candidate for cath given his comorbidities (01/04/18 c/s note).   -  CXR from 2/5 with vascular congestion, peribronchial thickening, mild interstitial edema -  Continue Lasix 40 IV daily, not responding to lasix 40 PO at home      [facility] Weights   01/31/18 0534 02/01/18 0500  Weight: 83.8 kg (184 lb 11.9 oz) 83.8 kg (184 lb 11.9 oz)   CAD s/p bypass with elevated troponin- flat and stable EKG.  Continue to monitor as above.   Stage III CKD: continue to monitor with diuresis.  Baseline creatinine fluctuating from about 1.4-1.6 recently.   Urothelial  carcinoma of bladder: receiving palliative chemotherapy with gemictabine/carboplatin.  Dr. [name] following.  Added to treatment team.  He's s/p bilateral nephrostomy tube placement and diverting colostomy.    Essential hypertension- coreg, hydralazine, imdur, lasix  Hyponatremia: mild, continue to monitor  Chronic low back pain-continue home dosing OxyContin 10 mg extended release twice a day and as needed Maliyah Willets acting.   PAD (peripheral artery disease) (HCC)stable-  Anemia  Thrombocytopenia: Followed by oncology and related to chemotherapy.  s/p platelet transfusion 2/4 and 1 unit of pRBC on 2/5. -  Improving and can follow up with Dr. [name] as outpatient  DVT prophylaxis:  SCDs Code Status:  Full  Family Communication:  Patient and his wife Disposition Plan:  Anticipate home with home health services on 2/7 pending urine culture and further improvement in mentation.     Consultants:   Dr. [name]  Procedures:  none  Antimicrobials:  Anti-infectives (From admission, onward)   Start     Dose/Rate Route Frequency Ordered Stop   01/31/18 1000  cefTRIAXone (ROCEPHIN) 1 g in dextrose 5 % 50 mL IVPB     1 g 100 mL/hr over 30 Minutes Intravenous Every 24 hours 01/31/18 0911     01/31/18 0647  cefoTEtan (CEFOTAN) 2 g in dextrose 5 % 50 mL IVPB  Status:  Discontinued     2 g 100 mL/hr over 30 Minutes Intravenous On call to O.R. 01/31/18 1448 01/31/18 0735       Subjective:  Feeling somewhat better, but still having severe suprapubic  pain every time he eats.  Thinking is clearer according to wife.  Breathing more easily.    Objective: Vitals:   02/02/18 0409 02/02/18 0430 02/02/18 1523 02/02/18 2020  BP: 110/67  96/73 110/64  Pulse: 86  74 86  Resp: 16  18 20   Temp: 99.1 F (37.3 C)  97.8 F (36.6 C) (!) 97.4 F (36.3 C)  TempSrc: Oral  Oral Oral  SpO2: 99%  99% 99%  Weight:  81 kg (178 lb 9.6 oz)    Height:        Intake/Output Summary (Last 24 hours) at  02/02/2018 2051 Last data filed at 02/02/2018 1924 Gross per 24 hour  Intake 981.67 ml  Output 2200 ml  Net -1218.33 ml   Filed Weights   01/31/18 0534 02/01/18 0500 02/02/18 0430  Weight: 83.8 kg (184 lb 11.9 oz) 83.8 kg (184 lb 11.9 oz) 81 kg (178 lb 9.6 oz)    Examination:  General exam:  Adult male.  No acute distress.  HEENT:  NCAT, MMM Respiratory system: Clear to auscultation bilaterally Cardiovascular system: Regular rate and rhythm, normal S1/S2. No murmurs, rubs, gallops or clicks.  Warm extremities Gastrointestinal system: Normal active bowel sounds, soft, nondistended.  TTP in the suprapubic area.  Colostomy with soft stool.  Bilateral nephrostomy drains, no surrounding erythema or purulent discharge.  TTP over insertion sites.  More urine in right bag than left.   MSK:  Normal tone and bulk, no lower extremity edema Neuro:  Grossly intact    Data Reviewed: I have personally reviewed following labs and imaging studies  CBC: Recent Labs  Lab 01/27/18 0811  01/31/18 0153 01/31/18 0954 01/31/18 2043 02/01/18 0253 02/01/18 1311 02/01/18 1837 02/02/18 0310  WBC 14.2*  --  10.3 11.0*  --  10.0  --   --  9.6  NEUTROABS 12.1*  --  8.1* 8.5*  --   --   --   --   --   HGB  --    < > 10.2* 7.8* 7.4* 7.2* 7.5* 7.3* 7.9*  HCT 20.8*  --  29.3* 22.3* 21.5* 20.6* 21.6* 20.9* 22.7*  MCV 90.5  --  88.0 88.5  --  88.0  --   --  88.7  PLT 42*  --  13* 14*  --  29*  --   --  33*   < > = values in this interval not displayed.   Basic Metabolic Panel: Recent Labs  Lab 01/27/18 0811 01/31/18 0153 02/01/18 0253 02/02/18 0310  NA 133* 130* 130* 134*  K 3.8 3.8 3.5 3.4*  CL 103 99* 97* 99*  CO2 21* 20* 23 25  GLUCOSE 108 93 110* 117*  BUN 36* 37* 34* 37*  CREATININE 1.42* 1.62* 1.61* 1.58*  CALCIUM 8.9 8.9 8.6* 8.7*  MG  --   --   --  1.9   GFR: Estimated Creatinine Clearance: 36.1 mL/min (A) (by C-G formula based on SCr of 1.58 mg/dL (H)). Liver Function Tests: Recent  Labs  Lab 01/27/18 0811 01/31/18 0153  AST 20 24  ALT 10 16*  ALKPHOS 104 91  BILITOT 0.4 0.7  PROT 6.3* 6.7  ALBUMIN 3.3* 3.5   No results for input(s): LIPASE, AMYLASE in the last 168 hours. No results for input(s): AMMONIA in the last 168 hours. Coagulation Profile: No results for input(s): INR, PROTIME in the last 168 hours. Cardiac Enzymes: Recent Labs  Lab 01/31/18 0954 01/31/18 1511 01/31/18 2043  TROPONINI 0.09*  0.08* 0.08*   BNP (last 3 results) No results for input(s): PROBNP in the last 8760 hours. HbA1C: No results for input(s): HGBA1C in the last 72 hours. CBG: No results for input(s): GLUCAP in the last 168 hours. Lipid Profile: No results for input(s): CHOL, HDL, LDLCALC, TRIG, CHOLHDL, LDLDIRECT in the last 72 hours. Thyroid Function Tests: No results for input(s): TSH, T4TOTAL, FREET4, T3FREE, THYROIDAB in the last 72 hours. Anemia Panel: No results for input(s): VITAMINB12, FOLATE, FERRITIN, TIBC, IRON, RETICCTPCT in the last 72 hours. Urine analysis:    Component Value Date/Time   COLORURINE YELLOW 01/31/2018 0102   APPEARANCEUR HAZY (A) 01/31/2018 0102   LABSPEC 1.013 01/31/2018 0102   PHURINE 5.0 01/31/2018 0102   GLUCOSEU NEGATIVE 01/31/2018 0102   HGBUR MODERATE (A) 01/31/2018 0102   HGBUR negative 10/15/2010 1251   BILIRUBINUR NEGATIVE 01/31/2018 0102   BILIRUBINUR n 12/01/2016 1521   KETONESUR NEGATIVE 01/31/2018 0102   PROTEINUR 100 (A) 01/31/2018 0102   UROBILINOGEN 0.2 12/01/2016 1521   UROBILINOGEN 0.2 10/15/2010 1251   NITRITE POSITIVE (A) 01/31/2018 0102   LEUKOCYTESUR SMALL (A) 01/31/2018 0102   Sepsis Labs: @LABRCNTIP (procalcitonin:4,lacticidven:4)  )No results found for this or any previous visit (from the past 240 hour(s)).    Radiology Studies: No results found.   Scheduled Meds: . carvedilol  6.25 mg Oral BID WC  . Chlorhexidine Gluconate Cloth  6 each Topical Q0600  . dicyclomine  10 mg Oral TID AC & HS  .  feeding supplement (ENSURE ENLIVE)  237 mL Oral BID BM  . furosemide  40 mg Intravenous Daily  . hydrALAZINE  25 mg Oral TID  . isosorbide mononitrate  60 mg Oral Daily  . linaclotide  290 mcg Oral QAC breakfast  . multivitamin with minerals  1 tablet Oral Daily  . mupirocin ointment  1 application Nasal BID  . oxyCODONE  10 mg Oral BID  . potassium chloride  30 mEq Oral BID  . pramipexole  1 mg Oral Daily  . sodium chloride flush  3 mL Intravenous Q12H   Continuous Infusions: . sodium chloride    . cefTRIAXone (ROCEPHIN)  IV Stopped (02/02/18 1226)     LOS: 2 days    Time spent: 30 min    Janece Canterbury, MD [facility] Pager [phone]  If 7PM-7AM, please contact night-coverage [email or web address] Password [facility] 02/02/2018, 8:51 PM

## 2018-02-03 ENCOUNTER — Ambulatory Visit: Payer: Medicare HMO | Admitting: Oncology

## 2018-02-03 ENCOUNTER — Other Ambulatory Visit: Payer: Medicare HMO

## 2018-02-03 DIAGNOSIS — R410 Disorientation, unspecified: Secondary | ICD-10-CM

## 2018-02-03 LAB — BASIC METABOLIC PANEL
Anion gap: 10 (ref 5–15)
BUN: 35 mg/dL — AB (ref 6–20)
CALCIUM: 9.2 mg/dL (ref 8.9–10.3)
CO2: 24 mmol/L (ref 22–32)
Chloride: 100 mmol/L — ABNORMAL LOW (ref 101–111)
Creatinine, Ser: 1.47 mg/dL — ABNORMAL HIGH (ref 0.61–1.24)
GFR calc Af Amer: 50 mL/min — ABNORMAL LOW (ref >60)
GFR, EST NON AFRICAN AMERICAN: 43 mL/min — AB (ref >60)
GLUCOSE: 110 mg/dL — AB (ref 65–99)
POTASSIUM: 4.3 mmol/L (ref 3.5–5.1)
SODIUM: 134 mmol/L — AB (ref 135–145)

## 2018-02-03 LAB — CBC
HCT: 26 % — ABNORMAL LOW (ref 39.0–52.0)
Hemoglobin: 8.7 g/dL — ABNORMAL LOW (ref 13.0–17.0)
MCH: 30 pg (ref 26.0–34.0)
MCHC: 33.5 g/dL (ref 30.0–36.0)
MCV: 89.7 fL (ref 78.0–100.0)
PLATELETS: 37 10*3/uL — AB (ref 150–400)
RBC: 2.9 MIL/uL — ABNORMAL LOW (ref 4.22–5.81)
RDW: 16.4 % — AB (ref 11.5–15.5)
WBC: 9.5 10*3/uL (ref 4.0–10.5)

## 2018-02-03 MED ORDER — CEFDINIR 300 MG PO CAPS: 300.0000 mg | ORAL_CAPSULE | Freq: Two times a day (BID) | ORAL | 0 refills | Status: DC

## 2018-02-03 MED ORDER — MIRABEGRON ER 25 MG PO TB24: 25.0000 mg | ORAL_TABLET | Freq: Every day | ORAL | 0 refills | Status: DC

## 2018-02-03 MED ORDER — MIRABEGRON ER 25 MG PO TB24
25.0000 mg | ORAL_TABLET | Freq: Every day | ORAL | Status: DC
  Administered 2018-02-03: 25 mg via ORAL
  Filled 2018-02-03: qty 1

## 2018-02-03 MED ORDER — FUROSEMIDE 40 MG PO TABS: 40.0000 mg | ORAL_TABLET | Freq: Two times a day (BID) | ORAL | 0 refills | Status: DC

## 2018-02-03 MED ORDER — ENSURE ENLIVE PO LIQD: 237.0000 mL | Freq: Two times a day (BID) | ORAL | 0 refills | Status: DC

## 2018-02-03 MED ORDER — POTASSIUM CHLORIDE CRYS ER 20 MEQ PO TBCR: 20.0000 meq | EXTENDED_RELEASE_TABLET | Freq: Every day | ORAL | 0 refills | Status: DC

## 2018-02-03 NOTE — Care Management Important Message (Signed)
Important Message  Patient Details IM Letter given to Cookie/Case Manager to present to the Patient Name: Charles Coffey MRN: [id number] Date of Birth: 1935-11-24   Medicare Important Message Given:  Yes    Kerin Salen 02/03/2018, 11:00 AM

## 2018-02-03 NOTE — Progress Notes (Signed)
PROGRESS NOTE  Charles Coffey  DZH:[id number] DOB: 06/12/1935 DOA: 01/31/2018 PCP: [name], MD  Brief Narrative:   Charles Fennel Singeris a 83 y.o.malewith medical history significant ofbladder cancer, coronary artery disease, hypertensionwho presented withshortness of breath that worsened after he got a blood transfusionlast week. He alsohad some fever up to 102. His wife reports that he has been very confused on and off more so over the last month or so. He just was diagnosed with bladder cancer and started on chemo treatment in December 2018. He does have suprapubic pain that is chronic and lower back pain that is all also associated with his bladder cancer. Wife does not feel like his pain is well controlled. Patient was referred for admission for probable congestive heart failure exacerbation and UTI.  He clinically improved with diuresis and ceftriaxone.  His urine culture was obtained after antibiotics were initiated and grew S. Aureus which may be a colonizer or contaminant in the setting of bilateral nephrostomy tubes.  Regardless, the patient clinically improved with ceftriaxone, so doubt that S. Aureus was the cause of his infection.  Will discharge with cefdinir to complete a 14 day course of antibiotics for UTI.  F/u with Urology.  He was also diuresed with IV lasix and weight on the date of discharge is 80kg.     Assessment & Plan:  Pyelonephritis: Pt with fever, and UA with nitrites, hematuria and WBC 6-30.  Clinically improved with ceftriaxone - Urine culture grew 50,000 of S. Aureus, may be contaminant or sensitive strain -Cefdinir x 2 week course  Metabolic encephalopathy,improved with antibiotics and diuresis.    Acute on chronic systolic heart failure, HFrEF Exacerbation: echo 01/02/18 with EF 25%. Presumed ischemic cardiomyopathy, per cards, not good candidate for cath given his comorbidities (01/04/18 c/s note). Diuresed with lasix 40mg  IV and since  he was not responding to his lasix 40mg  daily at home will increase home dose until follow up with cardiology.   - Increased to lasix40mg  po BID   CAD s/p bypass with elevated troponin-flat and stable EKG. Continue to monitor as above.   Stage III CKD: continue to monitor with diuresis. Baseline creatinine fluctuating from about 1.4-1.6 recently.   Urothelial carcinoma of bladder: receiving palliative chemotherapy with gemictabine/carboplatin. Dr. [name] following. He's s/p bilateral nephrostomy tubeplacement and diverting colostomy.Left nephrostomy has stopped draining.  Now has urinary retention.  Spoke with Dr. [name] who recommends placing the left nephrostomy drain and changing the right nephrostomy catheter to a right nephroureteral catheter.  Procedure cannot be done until 2/8.  We will place a Foley catheter for comfort overnight pending his procedure. -  Patient has elected to stop his chemotherapy -  IR consult:  Plan for procedure tomorrow -  Appreciate Urology assistance  Lower abdominal spasms, not improved with bentyl -  Stopped bentyl -  Trial of myrbetriq  Essential hypertension now mildly hypotensive -hold parameter for coreg -  D/c hydralazine, imdur, lasix  Hyponatremia: mild, continue to monitor  Chronic low back pain-continue home dosing OxyContin 10 mg extended release twice a day and as needed [name] acting.   PAD (peripheral artery disease) (HCC)stable-  Anemia  Thrombocytopenia: Followed by oncology and related to chemotherapy. s/p platelet transfusion2/4 and1 unit of pRBCon 2/5. - Improving and can follow up with Dr. [name] as outpatient   DVT prophylaxis:  SCDs Code Status:  Full  Family Communication:  Patient and his wife Disposition Plan:  Anticipate home with home health services  on tomorrow after catheters are exchanged  Consultants:   Dr. [name]   Spoke on phone with Urology, Dr. [name]  Procedures:   none  Antimicrobials:  Anti-infectives (From admission, onward)   Start     Dose/Rate Route Frequency Ordered Stop   02/03/18 0000  cefdinir (OMNICEF) 300 MG capsule     300 mg Oral 2 times daily 02/03/18 1419 02/13/18 2359   01/31/18 1000  cefTRIAXone (ROCEPHIN) 1 g in dextrose 5 % 50 mL IVPB     1 g 100 mL/hr over 30 Minutes Intravenous Every 24 hours 01/31/18 0911     01/31/18 0647  cefoTEtan (CEFOTAN) 2 g in dextrose 5 % 50 mL IVPB  Status:  Discontinued     2 g 100 mL/hr over 30 Minutes Intravenous On call to O.R. 01/31/18 6063 01/31/18 0735       Subjective:  Feeling well except that he continues to have bladder spasms or rectal spasms every time he tries to eat.  These have been going on for a long time and he does not feel the Bentyl is helping.  His mentation has improved according to his wife and he is near his baseline.  He denies chest pains and is breathing comfortably.   Objective: Vitals:   02/03/18 0536 02/03/18 0930 02/03/18 1000 02/03/18 1319  BP: 111/79 130/68 122/63 (!) 89/62  Pulse: 86  73 65  Resp: 20  20 20   Temp: 98.3 F (36.8 C)  98.4 F (36.9 C) 98.3 F (36.8 C)  TempSrc: Oral  Oral Oral  SpO2:   99% 97%  Weight: 80 kg (176 lb 5.9 oz)     Height:        Intake/Output Summary (Last 24 hours) at 02/03/2018 1715 Last data filed at 02/03/2018 1100 Gross per 24 hour  Intake 250 ml  Output 450 ml  Net -200 ml   Filed Weights   02/01/18 0500 02/02/18 0430 02/03/18 0536  Weight: 83.8 kg (184 lb 11.9 oz) 81 kg (178 lb 9.6 oz) 80 kg (176 lb 5.9 oz)    Examination:  General: Pt is alert, awake, not in acute distress Cardiovascular: RRR, S1/S2 +, no rubs, no gallops Respiratory: CTA bilaterally, no wheezing, no rhonchi Abdominal: Soft, NT, ND, bowel sounds +.  Ostomy with soft stool. Extremities: no edema, no cyanosis GU: Right nephrostomy drain with urine that is filling the bag.  Left nephrostomy tube with just a trace amount of urine.   Data  Reviewed: I have personally reviewed following labs and imaging studies  CBC: Recent Labs  Lab 01/31/18 0153 01/31/18 0954  02/01/18 0253 02/01/18 1311 02/01/18 1837 02/02/18 0310 02/03/18 0430  WBC 10.3 11.0*  --  10.0  --   --  9.6 9.5  NEUTROABS 8.1* 8.5*  --   --   --   --   --   --   HGB 10.2* 7.8*   < > 7.2* 7.5* 7.3* 7.9* 8.7*  HCT 29.3* 22.3*   < > 20.6* 21.6* 20.9* 22.7* 26.0*  MCV 88.0 88.5  --  88.0  --   --  88.7 89.7  PLT 13* 14*  --  29*  --   --  33* 37*   < > = values in this interval not displayed.   Basic Metabolic Panel: Recent Labs  Lab 01/31/18 0153 02/01/18 0253 02/02/18 0310 02/03/18 0430  NA 130* 130* 134* 134*  K 3.8 3.5 3.4* 4.3  CL 99* 97*  99* 100*  CO2 20* 23 25 24   GLUCOSE 93 110* 117* 110*  BUN 37* 34* 37* 35*  CREATININE 1.62* 1.61* 1.58* 1.47*  CALCIUM 8.9 8.6* 8.7* 9.2  MG  --   --  1.9  --    GFR: Estimated Creatinine Clearance: 38.8 mL/min (A) (by C-G formula based on SCr of 1.47 mg/dL (H)). Liver Function Tests: Recent Labs  Lab 01/31/18 0153  AST 24  ALT 16*  ALKPHOS 91  BILITOT 0.7  PROT 6.7  ALBUMIN 3.5   No results for input(s): LIPASE, AMYLASE in the last 168 hours. No results for input(s): AMMONIA in the last 168 hours. Coagulation Profile: No results for input(s): INR, PROTIME in the last 168 hours. Cardiac Enzymes: Recent Labs  Lab 01/31/18 0954 01/31/18 1511 01/31/18 2043  TROPONINI 0.09* 0.08* 0.08*   BNP (last 3 results) No results for input(s): PROBNP in the last 8760 hours. HbA1C: No results for input(s): HGBA1C in the last 72 hours. CBG: No results for input(s): GLUCAP in the last 168 hours. Lipid Profile: No results for input(s): CHOL, HDL, LDLCALC, TRIG, CHOLHDL, LDLDIRECT in the last 72 hours. Thyroid Function Tests: No results for input(s): TSH, T4TOTAL, FREET4, T3FREE, THYROIDAB in the last 72 hours. Anemia Panel: No results for input(s): VITAMINB12, FOLATE, FERRITIN, TIBC, IRON, RETICCTPCT  in the last 72 hours. Urine analysis:    Component Value Date/Time   COLORURINE YELLOW 01/31/2018 0102   APPEARANCEUR HAZY (A) 01/31/2018 0102   LABSPEC 1.013 01/31/2018 0102   PHURINE 5.0 01/31/2018 0102   GLUCOSEU NEGATIVE 01/31/2018 0102   HGBUR MODERATE (A) 01/31/2018 0102   HGBUR negative 10/15/2010 1251   BILIRUBINUR NEGATIVE 01/31/2018 0102   BILIRUBINUR n 12/01/2016 1521   KETONESUR NEGATIVE 01/31/2018 0102   PROTEINUR 100 (A) 01/31/2018 0102   UROBILINOGEN 0.2 12/01/2016 1521   UROBILINOGEN 0.2 10/15/2010 1251   NITRITE POSITIVE (A) 01/31/2018 0102   LEUKOCYTESUR SMALL (A) 01/31/2018 0102   Sepsis Labs: @LABRCNTIP (procalcitonin:4,lacticidven:4)  ) Recent Results (from the past 240 hour(s))  Culture, Urine     Status: Abnormal (Preliminary result)   Collection Time: 02/01/18  5:44 PM  Result Value Ref Range Status   Specimen Description   Final    URINE, CLEAN CATCH Performed at [facility], [place] [address], [place], [place] [address]    Special Requests   Final    NONE Performed at [facility], [place] [address], [place], [place] [address]    Culture (A)  Final    50,000 COLONIES/mL STAPHYLOCOCCUS AUREUS SUSCEPTIBILITIES TO FOLLOW Performed at [facility], [place] [address], [place], [place] [address]    Report Status PENDING  Incomplete      Radiology Studies: No results found.   Scheduled Meds: . carvedilol  6.25 mg Oral BID WC  . Chlorhexidine Gluconate Cloth  6 each Topical Q0600  . feeding supplement (ENSURE ENLIVE)  237 mL Oral BID BM  . linaclotide  290 mcg Oral QAC breakfast  . mirabegron ER  25 mg Oral Daily  . multivitamin with minerals  1 tablet Oral Daily  . mupirocin ointment  1 application Nasal BID  . oxyCODONE  10 mg Oral BID  . potassium chloride  30 mEq Oral BID  . pramipexole  1 mg Oral Daily  . sodium chloride flush  3 mL Intravenous Q12H   Continuous Infusions: . sodium chloride    .  cefTRIAXone (ROCEPHIN)  IV Stopped (02/03/18 1000)  LOS: 3 days    Time spent: 30 min    Janece Canterbury, MD [facility] Pager [phone]  If 7PM-7AM, please contact night-coverage [email or web address] Password [email or web address] 02/03/2018, 5:15 PM

## 2018-02-03 NOTE — Progress Notes (Signed)
BP low but asymptomatic, MD made aware. MD gave verbal order to continue with discharge home but to instruct patient to hold BP medications and Lasix until tomorrow.

## 2018-02-03 NOTE — Care Management Note (Signed)
Case Management Note  Patient Details  Name: Charles Coffey MRN: [id number] Date of Birth: 27-Nov-1935  Subjective/Objective: Pt admitted with SOB                   Action/Plan: Plan to discharge home with wife and [place]   Expected Discharge Date:  01/28/18               Expected Discharge Plan:  [place]  In-House Referral:  Clinical Social Work  Discharge planning Services  CM Consult  Post Acute Care Choice:    Choice offered to:  Patient  DME Arranged:    DME Agency:     HH Arranged:  RN [place] Agency:  [place]  Status of Service:  In process, will continue to follow  If discussed at Long Length of Stay Meetings, dates discussed:    Additional CommentsPurcell Mouton, RN 02/03/2018, 1:03 PM

## 2018-02-03 NOTE — Progress Notes (Signed)
IP PROGRESS NOTE  Subjective:   Charles Coffey feeling reasonably fair this morning without any new complaints.  He still have pelvic discomfort at times but it is manageable.  He does report episodic confusion when he takes pain medication.  Objective:  Vital signs in last 24 hours: Temp:  [97.4 F (36.3 C)-98.3 F (36.8 C)] 98.3 F (36.8 C) (02/07 0536) Pulse Rate:  [74-86] 86 (02/07 0536) Resp:  [18-20] 20 (02/07 0536) BP: (96-111)/(64-79) 111/79 (02/07 0536) SpO2:  [99 %] 99 % (02/06 2020) Weight:  [176 lb 5.9 oz (80 kg)] 176 lb 5.9 oz (80 kg) (02/07 0536) Weight change: -3.7 oz (-1.012 kg) Last BM Date: 02/01/18(per ostomy)  Intake/Output from previous day: 02/06 0701 - 02/07 0700 In: 540 [P.O.:480; I.V.:10; IV Piggyback:50] Out: 9798 [Urine:1325; Stool:400] Chronically ill-appearing gentleman without distress today. Mouth: mucous membranes moist, pharynx normal without lesions Resp: clear to auscultation bilaterally without rhonchi or wheezes. Cardio: regular rate and rhythm, S1, S2 normal, no murmur, click, rub or gallop GI: soft, non-tender; bowel sounds normal; no masses,  no organomegaly no shifting dullness or ascites. Musculoskeletal: No joint deformity or effusion. Neurological: Intact without deficits. Skin: No rashes or lesions.  Portacath in place without complications.  Lab Results: Recent Labs    02/02/18 0310 02/03/18 0430  WBC 9.6 9.5  HGB 7.9* 8.7*  HCT 22.7* 26.0*  PLT 33* 37*    BMET Recent Labs    02/02/18 0310 02/03/18 0430  NA 134* 134*  K 3.4* 4.3  CL 99* 100*  CO2 25 24  GLUCOSE 117* 110*  BUN 37* 35*  CREATININE 1.58* 1.47*  CALCIUM 8.7* 9.2    Studies/Results: No results found.  Medications: I have reviewed the patient's current medications.  Assessment/Plan:  83 year old gentleman with the following issues:  1.  Advanced urothelial carcinoma with lymphadenopathy and local invasion into the rectum.  The natural course  of this disease was discussed today with the patient and detail as well as treatment options moving forward.  He has been receiving palliative chemotherapy with multiple complications including pancytopenia and recurrent infections.  The goal of chemotherapy is palliative and so far chemotherapy has not palliated his disease and caused more harm than good at this time.  The rationale for stopping chemotherapy as well as the overall prognosis was discussed today in detail.  Given the complications that are associated with chemotherapy and the minimal benefit he is experiencing, he is opted to stop chemotherapy for the time being.  I support his decision but he understands that this cancer would likely progress over a period of time and will lead to his death.  We will continue to address this with him moving forward but for the time being we will suspend chemotherapy.   2.  Urinary sepsis: Appears to have resolved at this time.  No clinical signs or symptoms of sepsis.  3.  Thrombocytopenia: Improved without any active bleeding.  No need for further transfusion.  4.  Anemia: Related to malignancy as well as chemotherapy.  His hemoglobin is stable currently at 8.7.  5.  Prognosis: Overall poor with limited life expectancy.  He has an aggressive malignancy and regardless whether he received treatment will likely suffer from disease progression eventually.  30 minutes was spent with the patient face-to-face today.  More than 50% of time was dedicated to patient counseling, education and coordinating his care and answering questions regarding his prognosis.   LOS: 3 days   Zola Button  02/03/2018, 9:18 AM

## 2018-02-03 NOTE — Discharge Summary (Signed)
Physician Discharge Summary  Charles Coffey FYB:[id number] DOB: [birth year] DOA: 01/31/2018  PCP: [name], MD  Admit date: 01/31/2018 Discharge date: 02/03/2018  Admitted From: home  Disposition:  home  Recommendations for Outpatient Follow-up:  1. Follow up with cardiology PA on Monday.  Please repeat BMP to check creatinine and potassium.  Adjustments should be made to Lasix and potassium if needed. 2. Follow-up with urology in 2 weeks to discuss nephrostomy drains and recent urinary tract infection 3. Follow-up urine culture 4. Dr. [name] has already scheduled follow-up regarding anemia and thrombocytopenia and cancer.   Home Health:  RN, PT/OT  Equipment/Devices:  none  Discharge Condition:  Stable, improved CODE STATUS:  Full code  Diet recommendation:  Healthy heart   Brief/Interim Summary:  Charles Coffey a 83 y.o.malewith medical history significant ofbladder cancer, coronary artery disease, hypertension who presented with shortness of breath that worsened after he got a blood transfusion last week. He also had some fever up to 102. His wife reports that he has been very confused on and off more so over the last month or so. He just was diagnosed with bladder cancer and started on chemo treatment in December 2018. He does have suprapubic pain that is chronic and lower back pain that is all also associated with his bladder cancer. Wife does not feel like his pain is well controlled. Patient was referred for admission for probable congestive heart failure exacerbation and UTI.  He clinically improved with diuresis and ceftriaxone.  His urine culture was obtained after antibiotics were initiated and grew S. Aureus which may be a colonizer or contaminant in the setting of bilateral nephrostomy tubes.  Regardless, the patient clinically improved with ceftriaxone, so doubt that S. Aureus was the cause of his infection.  Will discharge with cefdinir to complete a 14 day  course of antibiotics for UTI.  F/u with Urology.  He was also diuresed with IV lasix and weight on the date of discharge is 80kg.    Discharge Diagnoses:  Principal Problem:   Acute CHF (congestive heart failure) (HCC) Active Problems:   Essential hypertension   Cardiomyopathy, ischemic   Chronic low back pain   PAD (peripheral artery disease) (HCC)   Stage 3 chronic kidney disease (HCC)   Elevated troponin   Urothelial carcinoma of bladder (HCC)   Thrombocytopenia (HCC)   Colostomy in place [facility])   Acute lower UTI   Malnutrition of moderate degree  Pyelonephritis: Pt with fever, and UA with nitrites, hematuria and WBC 6-30.  Clinically improved with ceftriaxone -  Urine culture grew 50,000 of S. Aureus, may be contaminant or sensitive strain -  Cefdinir x 2 week course  Metabolic encephalopathy, improved with antibiotics and diuresis.    Acute on chronic systolic heart failure, HFrEF Exacerbation: echo 01/02/18 with EF 25%. Presumed ischemic cardiomyopathy, per cards, not good candidate for cath given his comorbidities (01/04/18 c/s note).  Diuresed with lasix 40mg  IV and since he was not responding to his lasix 40mg  daily at home will increase home dose until follow up with cardiology.   -   Increased to lasix 40mg  po BID   CAD s/p bypass with elevated troponin-flat and stable EKG. Continue to monitor as above.   Stage III CKD: continue to monitor with diuresis. Baseline creatinine fluctuating from about 1.4-1.6 recently.   Urothelial carcinoma of bladder: receiving palliative chemotherapy with gemictabine/carboplatin. Dr. [name] following. He's s/p bilateral nephrostomy tubeplacement and diverting colostomy. -  Patient has  elected to stop his chemotherapy -  Follow-up with Dr. Tresa Moore or Dr. [name] from urology  Lower abdominal spasms, not improved with bentyl -  Stopped bentyl -  Trial of myrbetriq  Essential hypertension-coreg, hydralazine, imdur,  lasix  Hyponatremia: mild, continue to monitor  Chronic low back pain-continue home dosing OxyContin 10 mg extended release twice a day and as needed Milam Allbaugh acting.   PAD (peripheral artery disease) (HCC)stable-  Anemia  Thrombocytopenia: Followed by oncology and related to chemotherapy. s/p platelet transfusion 2/4 and 1 unit of pRBC on 2/5. -  Improving and can follow up with Dr. [name] as outpatient   Discharge Instructions  Discharge Instructions    Call MD for:  difficulty breathing, headache or visual disturbances   Complete by:  As directed    Call MD for:  extreme fatigue   Complete by:  As directed    Call MD for:  hives   Complete by:  As directed    Call MD for:  persistant dizziness or light-headedness   Complete by:  As directed    Call MD for:  persistant nausea and vomiting   Complete by:  As directed    Call MD for:  severe uncontrolled pain   Complete by:  As directed    Call MD for:  temperature >100.4   Complete by:  As directed    Diet - low sodium heart healthy   Complete by:  As directed    Increase activity slowly   Complete by:  As directed      Allergies as of 02/03/2018      Reactions   Antihistamines, Loratadine-type Other (See Comments)   Unable to urinate   Statins Other (See Comments)   liver effects      Medication List    STOP taking these medications   dicyclomine 10 MG capsule Commonly known as:  BENTYL   temazepam 30 MG capsule Commonly known as:  RESTORIL     TAKE these medications   acetaminophen 500 MG tablet Commonly known as:  TYLENOL Take 1,000 mg by mouth every 8 (eight) hours as needed for mild pain or moderate pain.   carvedilol 6.25 MG tablet Commonly known as:  COREG Take 1 tablet (6.25 mg total) 2 (two) times daily with a meal by mouth.   cefdinir 300 MG capsule Commonly known as:  OMNICEF Take 1 capsule (300 mg total) by mouth 2 (two) times daily for 10 days.   feeding supplement (ENSURE ENLIVE)  Liqd Take 237 mLs by mouth 2 (two) times daily between meals.   furosemide 40 MG tablet Commonly known as:  LASIX Take 1 tablet (40 mg total) by mouth 2 (two) times daily. What changed:  when to take this   [facility] RE Place 1 application rectally as needed (hemorrhoids).   hydrALAZINE 25 MG tablet Commonly known as:  APRESOLINE Take 1 tablet (25 mg total) by mouth 3 (three) times daily.   isosorbide mononitrate 60 MG 24 hr tablet Commonly known as:  IMDUR Take 1 tablet (60 mg total) by mouth daily.   lidocaine-prilocaine cream Commonly known as:  EMLA Apply 1 application topically as needed.   linaclotide 290 MCG Caps capsule Commonly known as:  LINZESS TAKE 1 CAPSULE (290 MCG TOTAL) BY MOUTH DAILY. 30 MINUTES PRIOR TO A MEAL   mirabegron ER 25 MG Tb24 tablet Commonly known as:  MYRBETRIQ Take 1 tablet (25 mg total) by mouth daily. Start taking on:  02/04/2018  multivitamin with minerals Tabs tablet Take 1 tablet by mouth daily.   nitroGLYCERIN 0.4 MG SL tablet Commonly known as:  NITROSTAT Place 1 tablet (0.4 mg total) under the tongue every 5 (five) minutes as needed.   oxyCODONE 10 mg 12 hr tablet Commonly known as:  OXYCONTIN Take 1 tablet (10 mg total) by mouth 2 (two) times daily.   oxyCODONE-acetaminophen 10-325 MG tablet Commonly known as:  PERCOCET Take 1 tablet by mouth every 4 (four) hours as needed for pain.   potassium chloride SA 20 MEQ tablet Commonly known as:  K-DUR,KLOR-CON Take 1 tablet (20 mEq total) by mouth daily.   pramipexole 1 MG tablet Commonly known as:  MIRAPEX Take 1 mg by mouth daily.   prochlorperazine 10 MG tablet Commonly known as:  COMPAZINE Take 1 tablet (10 mg total) by mouth every 6 (six) hours as needed for nausea or vomiting.   ranitidine 300 MG tablet Commonly known as:  ZANTAC TAKE 1 TABLET BY MOUTH TWICE A DAY   traZODone 50 MG tablet Commonly known as:  DESYREL Take 1 tablet (50 mg total) by mouth at  bedtime as needed for sleep.   TYLENOL PM EXTRA STRENGTH 25-500 MG Tabs tablet Generic drug:  diphenhydramine-acetaminophen Take 1 tablet by mouth at bedtime as needed (pain,sleep).   VISINE MAXIMUM REDNESS RELIEF OP Place 2 drops into both eyes 2 (two) times daily as needed (dry eyes).       Allergies  Allergen Reactions  . Antihistamines, Loratadine-Type Other (See Comments)    Unable to urinate  . Statins Other (See Comments)    liver effects    Consultations:  Dr. [name]   Procedures/Studies: Dg Chest 2 View  Result Date: 01/31/2018 CLINICAL DATA:  Acute onset of fever and shortness of breath. Nausea. Confusion. EXAM: CHEST  2 VIEW COMPARISON:  Chest radiograph performed 01/03/2018 FINDINGS: The lungs are well-aerated. Vascular congestion is noted. Peribronchial thickening is noted. Mild interstitial edema has improved from the prior study. There is no evidence of pleural effusion or pneumothorax. The heart is normal in size; the mediastinal contour is within normal limits. A right-sided chest port is noted ending about the distal SVC. No acute osseous abnormalities are seen. IMPRESSION: Vascular congestion. Peribronchial thickening. Mild interstitial edema has improved from the prior study. Electronically Signed   By: Garald Balding M.D.   On: 01/31/2018 01:24   Ir Nephrostomy Exchange Left  Result Date: 01/28/2018 INDICATION: 83 year old male with a history of bilateral ureteral obstruction. EXAM: IR EXCHANGE NEPHROSTOMY RIGHT; IR EXCHANGE NEPHROSTOMY LEFT COMPARISON:  CT 01/01/2018, 12/24/2017 MEDICATIONS: None ANESTHESIA/SEDATION: None CONTRAST:  20 cc-administered into the collecting system(s) FLUOROSCOPY TIME:  Fluoroscopy Time: 1 minutes 0 seconds (21.0 mGy). COMPLICATIONS: None immediate. PROCEDURE: Informed written consent was obtained from the patient after a thorough discussion of the procedural risks, benefits and alternatives. All questions were addressed. Maximal  Sterile Barrier Technique was utilized including caps, mask, sterile gowns, sterile gloves, sterile drape, hand hygiene and skin antiseptic. A timeout was performed prior to the initiation of the procedure. Patient position prone position on the table, and the indwelling bilateral percutaneous nephrostomy were prepped and draped in the usual sterile fashion along with the bilateral flank. Scout image was obtained. Left: Contrast was infused through the indwelling catheter, opacifying the collecting system. The left catheter was then ligated, and an 035 wire was advanced to the collecting system. The catheter was then removed over the wire. A new, 10 French percutaneous nephrostomy tube  was advanced over the wire, with the radial opaque marker positioned at the collecting system. The wire and inner dilator/stiffener were removed, and the pigtail catheter was formed in the collecting system. Contrast was infused through the catheter confirming position. A final image was stored. Right: Contrast was infused through the indwelling catheter, opacifying the collecting system, as well as the proximal ureter. The right catheter was then ligated, and an 035 wire was advanced to the collecting system. The catheter was then removed over the wire. A new, 10 French percutaneous nephrostomy tube was advanced over the wire, with the radial opaque marker positioned at the collecting system. The wire and inner dilator/stiffener were removed, and the pigtail catheter was formed in the collecting system. Contrast was infused through the catheter confirming position. A final image was stored. The patient tolerated the procedure well and remained hemodynamically stable throughout. No complications were encountered and no significant blood loss was encountered. IMPRESSION: Status post bilateral percutaneous nephrostomy exchange. New 10 French catheters placed bilaterally. Signed, Dulcy Fanny. Earleen Newport, DO [facility] Electronically Signed   By: Corrie Mckusick D.O.   On: 01/28/2018 16:24   Ir Nephrostomy Exchange Right  Result Date: 01/28/2018 INDICATION: 83 year old male with a history of bilateral ureteral obstruction. EXAM: IR EXCHANGE NEPHROSTOMY RIGHT; IR EXCHANGE NEPHROSTOMY LEFT COMPARISON:  CT 01/01/2018, 12/24/2017 MEDICATIONS: None ANESTHESIA/SEDATION: None CONTRAST:  20 cc-administered into the collecting system(s) FLUOROSCOPY TIME:  Fluoroscopy Time: 1 minutes 0 seconds (21.0 mGy). COMPLICATIONS: None immediate. PROCEDURE: Informed written consent was obtained from the patient after a thorough discussion of the procedural risks, benefits and alternatives. All questions were addressed. Maximal Sterile Barrier Technique was utilized including caps, mask, sterile gowns, sterile gloves, sterile drape, hand hygiene and skin antiseptic. A timeout was performed prior to the initiation of the procedure. Patient position prone position on the table, and the indwelling bilateral percutaneous nephrostomy were prepped and draped in the usual sterile fashion along with the bilateral flank. Scout image was obtained. Left: Contrast was infused through the indwelling catheter, opacifying the collecting system. The left catheter was then ligated, and an 035 wire was advanced to the collecting system. The catheter was then removed over the wire. A new, 10 French percutaneous nephrostomy tube was advanced over the wire, with the radial opaque marker positioned at the collecting system. The wire and inner dilator/stiffener were removed, and the pigtail catheter was formed in the collecting system. Contrast was infused through the catheter confirming position. A final image was stored. Right: Contrast was infused through the indwelling catheter, opacifying the collecting system, as well as the proximal ureter. The right catheter was then ligated, and an 035 wire was advanced to the collecting system. The  catheter was then removed over the wire. A new, 10 French percutaneous nephrostomy tube was advanced over the wire, with the radial opaque marker positioned at the collecting system. The wire and inner dilator/stiffener were removed, and the pigtail catheter was formed in the collecting system. Contrast was infused through the catheter confirming position. A final image was stored. The patient tolerated the procedure well and remained hemodynamically stable throughout. No complications were encountered and no significant blood loss was encountered. IMPRESSION: Status post bilateral percutaneous nephrostomy exchange. New 10 French catheters placed bilaterally. Signed, Dulcy Fanny. Earleen Newport, DO [facility] Electronically Signed   By: Corrie Mckusick D.O.   On: 01/28/2018 16:24    Subjective:  Feeling well except that  he continues to have bladder spasms or rectal spasms every time he tries to eat.  These have been going on for a long time and he does not feel the Bentyl is helping.  His mentation has improved according to his wife and he is near his baseline.  He denies chest pains and is breathing comfortably.  Discharge Exam: Vitals:   02/03/18 1000 02/03/18 1319  BP: 122/63 (!) 89/62  Pulse: 73 65  Resp: 20 20  Temp: 98.4 F (36.9 C) 98.3 F (36.8 C)  SpO2: 99% 97%   Vitals:   02/03/18 0536 02/03/18 0930 02/03/18 1000 02/03/18 1319  BP: 111/79 130/68 122/63 (!) 89/62  Pulse: 86  73 65  Resp: 20  20 20   Temp: 98.3 F (36.8 C)  98.4 F (36.9 C) 98.3 F (36.8 C)  TempSrc: Oral  Oral Oral  SpO2:   99% 97%  Weight: 80 kg (176 lb 5.9 oz)     Height:        General: Pt is alert, awake, not in acute distress Cardiovascular: RRR, S1/S2 +, no rubs, no gallops Respiratory: CTA bilaterally, no wheezing, no rhonchi Abdominal: Soft, NT, ND, bowel sounds +.  Ostomy with soft stool. Extremities: no edema, no cyanosis GU: Right nephrostomy drain  with urine that is filling the bag.  Left nephrostomy tube with just a trace amount of urine.    The results of significant diagnostics from this hospitalization (including imaging, microbiology, ancillary and laboratory) are listed below for reference.     Microbiology: Recent Results (from the past 240 hour(s))  Culture, Urine     Status: Abnormal (Preliminary result)   Collection Time: 02/01/18  5:44 PM  Result Value Ref Range Status   Specimen Description   Final    URINE, CLEAN CATCH Performed at [facility], [place] [address] [place], [place] [address]    Special Requests   Final    NONE Performed at [facility], [place] [address] [place], [place] [address]    Culture (A)  Final    50,000 COLONIES/mL STAPHYLOCOCCUS AUREUS SUSCEPTIBILITIES TO FOLLOW Performed at [facility], [place] [address], [place], [place] [address]    Report Status PENDING  Incomplete     Labs: BNP (last 3 results) Recent Labs    01/31/18 0153  BNP 7,341.9*   Basic Metabolic Panel: Recent Labs  Lab 01/31/18 0153 02/01/18 0253 02/02/18 0310 02/03/18 0430  NA 130* 130* 134* 134*  K 3.8 3.5 3.4* 4.3  CL 99* 97* 99* 100*  CO2 20* 23 25 24   GLUCOSE 93 110* 117* 110*  BUN 37* 34* 37* 35*  CREATININE 1.62* 1.61* 1.58* 1.47*  CALCIUM 8.9 8.6* 8.7* 9.2  MG  --   --  1.9  --    Liver Function Tests: Recent Labs  Lab 01/31/18 0153  AST 24  ALT 16*  ALKPHOS 91  BILITOT 0.7  PROT 6.7  ALBUMIN 3.5   No results for input(s): LIPASE, AMYLASE in the last 168 hours. No results for input(s): AMMONIA in the last 168 hours. CBC: Recent Labs  Lab 01/31/18 0153 01/31/18 0954  02/01/18 0253 02/01/18 1311 02/01/18 1837 02/02/18 0310 02/03/18 0430  WBC 10.3 11.0*  --  10.0  --   --  9.6 9.5  NEUTROABS 8.1* 8.5*  --   --   --   --   --   --   HGB 10.2* 7.8*   < >  7.2* 7.5* 7.3* 7.9* 8.7*  HCT 29.3* 22.3*   < > 20.6* 21.6* 20.9* 22.7* 26.0*  MCV 88.0  88.5  --  88.0  --   --  88.7 89.7  PLT 13* 14*  --  29*  --   --  33* 37*   < > = values in this interval not displayed.   Cardiac Enzymes: Recent Labs  Lab 01/31/18 0954 01/31/18 1511 01/31/18 2043  TROPONINI 0.09* 0.08* 0.08*   BNP: Invalid input(s): POCBNP CBG: No results for input(s): GLUCAP in the last 168 hours. D-Dimer No results for input(s): DDIMER in the last 72 hours. Hgb A1c No results for input(s): HGBA1C in the last 72 hours. Lipid Profile No results for input(s): CHOL, HDL, LDLCALC, TRIG, CHOLHDL, LDLDIRECT in the last 72 hours. Thyroid function studies No results for input(s): TSH, T4TOTAL, T3FREE, THYROIDAB in the last 72 hours.  Invalid input(s): FREET3 Anemia work up No results for input(s): VITAMINB12, FOLATE, FERRITIN, TIBC, IRON, RETICCTPCT in the last 72 hours. Urinalysis    Component Value Date/Time   COLORURINE YELLOW 01/31/2018 0102   APPEARANCEUR HAZY (A) 01/31/2018 0102   LABSPEC 1.013 01/31/2018 0102   PHURINE 5.0 01/31/2018 0102   GLUCOSEU NEGATIVE 01/31/2018 0102   HGBUR MODERATE (A) 01/31/2018 0102   HGBUR negative 10/15/2010 1251   BILIRUBINUR NEGATIVE 01/31/2018 0102   BILIRUBINUR n 12/01/2016 1521   KETONESUR NEGATIVE 01/31/2018 0102   PROTEINUR 100 (A) 01/31/2018 0102   UROBILINOGEN 0.2 12/01/2016 1521   UROBILINOGEN 0.2 10/15/2010 1251   NITRITE POSITIVE (A) 01/31/2018 0102   LEUKOCYTESUR SMALL (A) 01/31/2018 0102   Sepsis Labs Invalid input(s): PROCALCITONIN,  WBC,  LACTICIDVEN   Time coordinating discharge: Over 30 minutes  SIGNED:   Janece Canterbury, MD  [facility] 02/03/2018, 2:19 PM Pager   If 7PM-7AM, please contact night-coverage [email or web address] Password [email or web address]

## 2018-02-03 NOTE — Progress Notes (Signed)
Bladder scan performed per MD verbal order. 750cc showed on scan. MD made aware. MD spoke with Urologist and obtained recommendations. No order to drain bladder.

## 2018-02-03 NOTE — Progress Notes (Signed)
Tried to change colostomy but patient requested not to change it until tomorrow.

## 2018-02-03 NOTE — Progress Notes (Signed)
Order to insert foley, unable to get foley in, Dr. Sheran Fava notified and urology consult to place foley.

## 2018-02-03 NOTE — Consult Note (Signed)
Urology Consult  Referring physician: Dr. Sheran Fava Reason for referral: urinary retention, difficult foley placement  Chief Complaint: suprapubic pain  History of Present Illness: Charles Coffey is a 83yo with a hx of metastatic urothelial cancer on palliative chemo and bilateral ureteral obstruction managed with indwelling nephrostomy tubes who was admitted with pelvic pain and malpositioned left nephrostomy tube. He is scheduled to undergo bilateral nephrostomy tube replacement tomorrow. On admission he was found to have 800cc in his bladder and he developed worsening of his chronic pelvic pain. Multiple attempts were made to place a foley catheter which were unsuccessful. Currently he has moderate, sharp, constant, nonradiaitng pelvic pain. No exacerbating/alleviating events. No other associated symptoms.   Past Medical History:  Diagnosis Date  . Aortic atherosclerosis ([place])   . BPH with urinary obstruction   . CAD (coronary artery disease)    a.  MI 1995, CABG x 3 2002 (patient says that he had LIMA and RIMA grafts). b. ETT-Cardiolite (10/15) with EF 48%, apical scar, no ischemia. c. Nuc 07/2017 abnormal -> cath was performed,  patent LIMA to LAD and SVG to OM 2. RIMA to RCA is atretic. The right coronary artery is occluded distally with extensive collaterals from the LAD, medical therapy.   . Cancer [facility])    bladder cancer  . Cervical spondylosis without myelopathy 05-Feb-202017  . Chronic low back pain    sees Dr. Kary Kos   . GERD (gastroesophageal reflux disease)   . Hiatal hernia   . Hyperlipidemia   . Hypertension   . IBS (irritable bowel syndrome)   . Ischemic cardiomyopathy   . Myocardial infarction ([place])   . RBBB   . Restless legs syndrome   . Statin intolerance   . Syncope    a. in 2015 - no apparent cause, was taking sleep medicine at the time. Cardiac workup unremarkable.  . Tubular adenoma of colon    Past Surgical History:  Procedure Laterality Date  . COLONOSCOPY   10/17/2014   per Dr. Hilarie Fredrickson, tubular adenomas, repeat in 3 yrs   . COLONOSCOPY WITH PROPOFOL N/A 12/01/2017   Procedure: COLONOSCOPY WITH PROPOFOL;  Surgeon: Milus Banister, MD;  Location: [facility];  Service: Endoscopy;  Laterality: N/A;  . CYSTOSCOPY W/ RETROGRADES Left 11/25/2017   Procedure: CYSTOSCOPY WITH RETROGRADE PYELOGRAM;  Surgeon: Irine Seal, MD;  Location: [facility];  Service: Urology;  Laterality: Left;  . HEART BYPASS    . IR FLUORO GUIDE CV LINE RIGHT  12/27/2017  . IR NEPHROSTOGRAM LEFT THRU EXISTING ACCESS  12/24/2017  . IR NEPHROSTOMY EXCHANGE LEFT  01/28/2018  . IR NEPHROSTOMY EXCHANGE RIGHT  12/24/2017  . IR NEPHROSTOMY EXCHANGE RIGHT  01/28/2018  . IR NEPHROSTOMY PLACEMENT LEFT  11/28/2017  . IR NEPHROSTOMY PLACEMENT RIGHT  12/03/2017  . IR US GUIDE VASC ACCESS RIGHT  12/27/2017  . LAPAROSCOPY N/A 12/01/2017   Procedure: LAPAROSCOPIC DIVERTING OSTOMY;  Surgeon: Stark Klein, MD;  Location: [facility];  Service: General;  Laterality: N/A;  . LEFT HEART CATH AND CORS/GRAFTS ANGIOGRAPHY N/A 08/25/2017   Procedure: LEFT HEART CATH AND CORS/GRAFTS ANGIOGRAPHY;  Surgeon: Wellington Hampshire, MD;  Location: [facility];  Service: Cardiovascular;  Laterality: N/A;  . LUMBAR FUSION  2003   L3-L4  . PROSTATE SURGERY  06-27-12   per Dr. Roni Bread, had CTT  . TONSILLECTOMY    . TRANSURETHRAL RESECTION OF BLADDER TUMOR N/A 11/25/2017   Procedure: TRANSURETHRAL RESECTION OF BLADDER TUMOR (TURBT);  Surgeon: Irine Seal,  MD;  Location[facility];  Service: Urology;  Laterality: N/A;    Medications: I have reviewed the patient's current medications. Allergies:  Allergies  Allergen Reactions  . Antihistamines, Loratadine-Type Other (See Comments)    Unable to urinate  . Statins Other (See Comments)    liver effects    Family History  Problem Relation Age of Onset  . Heart attack Mother   . Aneurysm Father        femoral artery  . Heart disease Brother   . Heart disease Maternal  Uncle        x 2  . Colon cancer Neg Hx   . Esophageal cancer Neg Hx   . Pancreatic cancer Neg Hx   . Kidney disease Neg Hx   . Liver disease Neg Hx    Social History:  reports that he quit smoking about 39 years ago. His smoking use included cigarettes. he has never used smokeless tobacco. He reports that he drinks about 1.2 oz of alcohol per week. He reports that he does not use drugs.  Review of Systems  Constitutional: Positive for malaise/fatigue.  Gastrointestinal: Positive for abdominal pain.  Genitourinary: Positive for urgency.  All other systems reviewed and are negative.   Physical Exam:  Vital signs in last 24 hours: Temp:  [98.3 F (36.8 C)-98.5 F (36.9 C)] 98.5 F (36.9 C) (02/07 2100) Pulse Rate:  [65-86] 70 (02/07 2100) Resp:  [18-20] 18 (02/07 2100) BP: (89-130)/(62-79) 100/70 (02/07 2100) SpO2:  [96 %-99 %] 96 % (02/07 2100) Weight:  [80 kg (176 lb 5.9 oz)] 80 kg (176 lb 5.9 oz) (02/07 0536) Physical Exam  Constitutional: He is oriented to person, place, and time. He appears well-developed and well-nourished.  HENT:  Head: Normocephalic and atraumatic.  Eyes: EOM are normal. Pupils are equal, round, and reactive to light.  Neck: Normal range of motion. No thyromegaly present.  Cardiovascular: Normal rate and regular rhythm.  Respiratory: Effort normal. No respiratory distress.  GI: Soft. He exhibits no distension. Hernia confirmed negative in the right inguinal area and confirmed negative in the left inguinal area.  Genitourinary: Testes normal and penis normal. Circumcised.  Musculoskeletal: Normal range of motion. He exhibits no edema.  Lymphadenopathy:       Right: No inguinal adenopathy present.       Left: No inguinal adenopathy present.  Neurological: He is alert and oriented to person, place, and time.  Skin: Skin is warm and dry.  Psychiatric: He has a normal mood and affect. His behavior is normal. Judgment and thought content normal.     Laboratory Data:  Results for orders placed or performed during the hospital encounter of 01/31/18 (from the past 72 hour(s))  Basic metabolic panel     Status: Abnormal   Collection Time: 02/01/18  2:53 AM  Result Value Ref Range   Sodium 130 (L) 135 - 145 mmol/L   Potassium 3.5 3.5 - 5.1 mmol/L   Chloride 97 (L) 101 - 111 mmol/L   CO2 23 22 - 32 mmol/L   Glucose, Bld 110 (H) 65 - 99 mg/dL   BUN 34 (H) 6 - 20 mg/dL   Creatinine, Ser 1.61 (H) 0.61 - 1.24 mg/dL   Calcium 8.6 (L) 8.9 - 10.3 mg/dL   GFR calc non Af Amer 39 (L) >60 mL/min   GFR calc Af Amer 45 (L) >60 mL/min    Comment: (NOTE) The eGFR has been calculated using the CKD EPI equation. This calculation  has not been validated in all clinical situations. eGFR's persistently <60 mL/min signify possible Chronic Kidney Disease.    Anion gap 10 5 - 15    Comment: Performed at [facility], [place] [address], [place], [place] [address]  CBC     Status: Abnormal   Collection Time: 02/01/18  2:53 AM  Result Value Ref Range   WBC 10.0 4.0 - 10.5 K/uL   RBC 2.34 (L) 4.22 - 5.81 MIL/uL   Hemoglobin 7.2 (L) 13.0 - 17.0 g/dL   HCT 20.6 (L) 39.0 - 52.0 %   MCV 88.0 78.0 - 100.0 fL   MCH 30.8 26.0 - 34.0 pg   MCHC 35.0 30.0 - 36.0 g/dL   RDW 16.8 (H) 11.5 - 15.5 %   Platelets 29 (LL) 150 - 400 K/uL    Comment: CRITICAL VALUE NOTED.  VALUE IS CONSISTENT WITH PREVIOUSLY REPORTED AND CALLED VALUE. Performed at [facility], [place] [address] [place], [place] [address]   Hemoglobin and hematocrit, blood     Status: Abnormal   Collection Time: 02/01/18  1:11 PM  Result Value Ref Range   Hemoglobin 7.5 (L) 13.0 - 17.0 g/dL   HCT 21.6 (L) 39.0 - 52.0 %    Comment: Performed at [facility], [place] [address], [place], [place] [address]  Culture, Urine     Status: Abnormal (Preliminary result)   Collection Time: 02/01/18  5:44 PM  Result Value Ref Range   Specimen Description       URINE, CLEAN CATCH Performed at [facility], [place] [address] [place], [place] [address]    Special Requests      NONE Performed at [facility], [place] [address], [place], [place] [address]    Culture (A)     50,000 COLONIES/mL STAPHYLOCOCCUS AUREUS SUSCEPTIBILITIES TO FOLLOW Performed at [facility], [place] [address] [place], [place] [address]    Report Status PENDING   Hemoglobin and hematocrit, blood     Status: Abnormal   Collection Time: 02/01/18  6:37 PM  Result Value Ref Range   Hemoglobin 7.3 (L) 13.0 - 17.0 g/dL   HCT 20.9 (L) 39.0 - 52.0 %    Comment: Performed at [facility], [place] [address], [place], [place] [address]  Prepare RBC     Status: None   Collection Time: 02/01/18  6:44 PM  Result Value Ref Range   Order Confirmation      ORDER PROCESSED BY BLOOD BANK Performed at [facility], [place] [address] [place], [place] [address]   Basic metabolic panel     Status: Abnormal   Collection Time: 02/02/18  3:10 AM  Result Value Ref Range   Sodium 134 (L) 135 - 145 mmol/L   Potassium 3.4 (L) 3.5 - 5.1 mmol/L   Chloride 99 (L) 101 - 111 mmol/L   CO2 25 22 - 32 mmol/L   Glucose, Bld 117 (H) 65 - 99 mg/dL   BUN 37 (H) 6 - 20 mg/dL   Creatinine, Ser 1.58 (H) 0.61 - 1.24 mg/dL   Calcium 8.7 (L) 8.9 - 10.3 mg/dL   GFR calc non Af Amer 40 (L) >60 mL/min   GFR calc Af Amer 46 (L) >60 mL/min    Comment: (NOTE) The eGFR has been calculated using the CKD EPI equation. This calculation has not been validated in all clinical situations. eGFR's persistently <60 mL/min signify possible Chronic Kidney  Disease.    Anion gap 10 5 - 15    Comment: Performed at [facility], [place] [address] [place], [place] [address]  CBC     Status: Abnormal   Collection Time: 02/02/18  3:10 AM  Result Value Ref Range   WBC 9.6 4.0 - 10.5 K/uL   RBC 2.56 (L) 4.22 - 5.81 MIL/uL   Hemoglobin  7.9 (L) 13.0 - 17.0 g/dL   HCT 22.7 (L) 39.0 - 52.0 %   MCV 88.7 78.0 - 100.0 fL   MCH 30.9 26.0 - 34.0 pg   MCHC 34.8 30.0 - 36.0 g/dL   RDW 16.3 (H) 11.5 - 15.5 %   Platelets 33 (L) 150 - 400 K/uL    Comment: CONSISTENT WITH PREVIOUS RESULT Performed at [place] [address], [place], [place] [address]   Magnesium     Status: None   Collection Time: 02/02/18  3:10 AM  Result Value Ref Range   Magnesium 1.9 1.7 - 2.4 mg/dL    Comment: Performed at [facility], [place] [address] [place], [place] [address]  Basic metabolic panel     Status: Abnormal   Collection Time: 02/03/18  4:30 AM  Result Value Ref Range   Sodium 134 (L) 135 - 145 mmol/L   Potassium 4.3 3.5 - 5.1 mmol/L    Comment: DELTA CHECK NOTED NO VISIBLE HEMOLYSIS    Chloride 100 (L) 101 - 111 mmol/L   CO2 24 22 - 32 mmol/L   Glucose, Bld 110 (H) 65 - 99 mg/dL   BUN 35 (H) 6 - 20 mg/dL   Creatinine, Ser 1.47 (H) 0.61 - 1.24 mg/dL   Calcium 9.2 8.9 - 10.3 mg/dL   GFR calc non Af Amer 43 (L) >60 mL/min   GFR calc Af Amer 50 (L) >60 mL/min    Comment: (NOTE) The eGFR has been calculated using the CKD EPI equation. This calculation has not been validated in all clinical situations. eGFR's persistently <60 mL/min signify possible Chronic Kidney Disease.    Anion gap 10 5 - 15    Comment: Performed at [facility], [place] [address] [place], [place] [address]  CBC     Status: Abnormal   Collection Time: 02/03/18  4:30 AM  Result Value Ref Range   WBC 9.5 4.0 - 10.5 K/uL   RBC 2.90 (L) 4.22 - 5.81 MIL/uL   Hemoglobin 8.7 (L) 13.0 - 17.0 g/dL   HCT 26.0 (L) 39.0 - 52.0 %   MCV 89.7 78.0 - 100.0 fL   MCH 30.0 26.0 - 34.0 pg   MCHC 33.5 30.0 - 36.0 g/dL   RDW 16.4 (H) 11.5 - 15.5 %   Platelets 37 (L) 150 - 400 K/uL    Comment: CONSISTENT WITH PREVIOUS RESULT Performed at [place] [address], [place], [place] [address]     Recent Results (from the past 240 hour(s))  Culture, Urine     Status: Abnormal (Preliminary result)   Collection Time: 02/01/18  5:44 PM  Result Value Ref Range Status   Specimen Description   Final    URINE, CLEAN CATCH Performed at [facility], [place] [address], [place], [place] [address]    Special Requests   Final    NONE Performed at [facility], [place] [address], [place], [place] [address]    Culture (A)  Final    50,000 COLONIES/mL STAPHYLOCOCCUS AUREUS  SUSCEPTIBILITIES TO FOLLOW Performed at [facility], [place] [address] [place], [place] [address]    Report Status PENDING  Incomplete   Creatinine: Recent Labs    01/31/18 0153 02/01/18 0253 02/02/18 0310 02/03/18 0430  CREATININE 1.62* 1.61* 1.58* 1.47*   Baseline Creatinine: 1.6  Impression/Assessment:  82yo with urinary retention, malpositioned left nephrostomy tube and pyocystis.  Plan:  1. Urinary retention: 18 french coude placed without incident and 1000cc of purulent material drained. Please continue indwelling foley 2. Pyocystitis: Urine sent for culture. Please start broad spectrum antibiotics pending urine culture. Please continue indwelling foley 3. Malpositioned left nephrostomy tube: Pt scheduled for bilateral nephrostomy tube replacement tomorrow  Nicolette Bang 02/03/2018, 9:06 PM

## 2018-02-04 ENCOUNTER — Inpatient Hospital Stay (HOSPITAL_COMMUNITY): Payer: Medicare HMO

## 2018-02-04 HISTORY — PX: IR NEPHROSTOMY EXCHANGE LEFT: IMG6069

## 2018-02-04 HISTORY — PX: IR CONVERT RIGHT NEPHROSTOMY TO NEPHROURETERAL CATH: IMG6068

## 2018-02-04 LAB — BASIC METABOLIC PANEL
Anion gap: 8 (ref 5–15)
BUN: 32 mg/dL — AB (ref 6–20)
CHLORIDE: 100 mmol/L — AB (ref 101–111)
CO2: 25 mmol/L (ref 22–32)
Calcium: 9 mg/dL (ref 8.9–10.3)
Creatinine, Ser: 1.44 mg/dL — ABNORMAL HIGH (ref 0.61–1.24)
GFR calc Af Amer: 51 mL/min — ABNORMAL LOW (ref >60)
GFR calc non Af Amer: 44 mL/min — ABNORMAL LOW (ref >60)
Glucose, Bld: 107 mg/dL — ABNORMAL HIGH (ref 65–99)
POTASSIUM: 4.5 mmol/L (ref 3.5–5.1)
SODIUM: 133 mmol/L — AB (ref 135–145)

## 2018-02-04 LAB — URINE CULTURE

## 2018-02-04 LAB — CBC
HCT: 22 % — ABNORMAL LOW (ref 39.0–52.0)
Hemoglobin: 7.6 g/dL — ABNORMAL LOW (ref 13.0–17.0)
MCH: 30.8 pg (ref 26.0–34.0)
MCHC: 34.5 g/dL (ref 30.0–36.0)
MCV: 89.1 fL (ref 78.0–100.0)
Platelets: 40 10*3/uL — ABNORMAL LOW (ref 150–400)
RBC: 2.47 MIL/uL — ABNORMAL LOW (ref 4.22–5.81)
RDW: 16.5 % — ABNORMAL HIGH (ref 11.5–15.5)
WBC: 9.2 10*3/uL (ref 4.0–10.5)

## 2018-02-04 MED ORDER — IOPAMIDOL (ISOVUE-300) INJECTION 61%
INTRAVENOUS | Status: AC
  Administered 2018-02-04: 25 mL
  Filled 2018-02-04: qty 50

## 2018-02-04 MED ORDER — LIDOCAINE HCL 1 % IJ SOLN
INTRAMUSCULAR | Status: AC
  Filled 2018-02-04: qty 20

## 2018-02-04 MED ORDER — MIRABEGRON ER 25 MG PO TB24
25.0000 mg | ORAL_TABLET | Freq: Every day | ORAL | Status: DC
  Administered 2018-02-04 – 2018-02-05 (×2): 25 mg via ORAL
  Filled 2018-02-04 (×2): qty 1

## 2018-02-04 NOTE — Progress Notes (Signed)
PROGRESS NOTE  Charles Coffey  TJQ:[id number] DOB: May 31, 1935 DOA: 01/31/2018 PCP: [name], MD  Brief Narrative:   Charles Coffey a 83 y.o.malewith medical history significant ofbladder cancer, coronary artery disease, hypertensionwho presented withshortness of breath that worsened after he got a blood transfusionlast week. He alsohad some fever up to 102. His wife reports that he has been very confused on and off more so over the last month or so. He just was diagnosed with bladder cancer and started on chemo treatment in December 2018. He does have suprapubic pain that is chronic and lower back pain that is all also associated with his bladder cancer. Wife does not feel like his pain is well controlled. Patient was referred for admission for probable congestive heart failure exacerbation and UTI.  He clinically improved with diuresis and ceftriaxone.  His urine culture was obtained after antibiotics were initiated and grew MRSA which may be a colonizer or contaminant in the setting of bilateral nephrostomy tubes.  Regardless, the patient clinically improved with ceftriaxone, so doubt that S. Aureus was the cause of his infection.  Will discharge with cefdinir to complete a 14 day course of antibiotics for UTI.  F/u with Urology.  He was also diuresed with IV lasix and weight on the date of discharge is 80kg.   Assessment & Plan:  Pyelonephritis: Pt with fever, and UA with nitrites, hematuria and WBC 6-30.  Clinically improved with ceftriaxone - Urine culture grew 50,000 of MRSA, likely contaminant or sensitive strain -Cefdinir x 2 week course  Metabolic encephalopathy,improved with antibiotics and diuresis.    Acute on chronic systolic heart failure, HFrEF Exacerbation: echo 01/02/18 with EF 25%. Presumed ischemic cardiomyopathy, per cards, not good candidate for cath given his comorbidities (01/04/18 c/s note). Diuresed with lasix 40mg  IV and since he was not  responding to his lasix 40mg  daily at home will increase home dose until follow up with cardiology.   - will increase home lasix to40mg  po BID   CAD s/p bypass with elevated troponin-flat and stable EKG. Continue to monitor as above.   Stage III CKD: continue to monitor with diuresis. Baseline creatinine fluctuating from about 1.4-1.6 recently.   Urothelial carcinoma of bladder: receiving palliative chemotherapy with gemictabine/carboplatin. Dr. [name] following. He's s/p bilateral nephrostomy tubeplacement and diverting colostomy.Left nephrostomy has stopped draining.  Now has urinary retention.  Spoke with Dr. [name] who recommends placing the left nephrostomy drain and changing the right nephrostomy catheter to a right nephroureteral catheter.  Procedure cannot be done until 2/8.  We will place a Foley catheter for comfort overnight pending his procedure. -  Patient has elected to stop his chemotherapy -  IR consult:  Plan for nephroureteral catheter placement on the right on 2/8 and replacement of the left nephrostomy tube on the left -  Appreciate Urology assistance -  Tolerating coude catheter placed by Urology  Lower abdominal spasms, not improved with bentyl -  Trial of myrbetriq  Essential hypertension now mildly hypotensive -hold parameter for coreg -  D/c hydralazine, imdur, lasix  Hyponatremia: mild, continue to monitor  Chronic low back pain-continue home dosing OxyContin 10 mg extended release twice a day and as needed [name] acting.   PAD (peripheral artery disease) (HCC)stable-  Anemia  Thrombocytopenia: Followed by oncology and related to chemotherapy. s/p platelet transfusion2/4 and1 unit of pRBCon 2/5. - Improving and can follow up with Dr. [name] as outpatient   DVT prophylaxis:  SCDs Code Status:  Full  Family Communication:  Patient and his wife Disposition Plan:  Anticipate home with home health services either today or tomorrow  depending on timing of procedure.    Consultants:   Dr. [name]   Spoke on phone with Urology, Dr. [name]  Procedures:  none  Antimicrobials:  Anti-infectives (From admission, onward)   Start     Dose/Rate Route Frequency Ordered Stop   02/03/18 0000  cefdinir (OMNICEF) 300 MG capsule     300 mg Oral 2 times daily 02/03/18 1419 02/13/18 2359   01/31/18 1000  cefTRIAXone (ROCEPHIN) 1 g in dextrose 5 % 50 mL IVPB     1 g 100 mL/hr over 30 Minutes Intravenous Every 24 hours 01/31/18 0911     01/31/18 0647  cefoTEtan (CEFOTAN) 2 g in dextrose 5 % 50 mL IVPB  Status:  Discontinued     2 g 100 mL/hr over 30 Minutes Intravenous On call to O.R. 01/31/18 1610 01/31/18 0735       Subjective:  Seems to be tolerating his coud catheter okay.  He thinks that the bladder spasms have improved since yesterday.  According to the RN, he is not having much out through his coud catheter and they have not been able to send sample for culture.  He denies chest pains, difficulty breathing, lightheadedness.   Objective: Vitals:   02/03/18 2100 02/04/18 0441 02/04/18 0821 02/04/18 1313  BP: 100/70 106/77 109/71 115/85  Pulse: 70 75 70 86  Resp: 18 18 16 16   Temp: 98.5 F (36.9 C) 98.5 F (36.9 C)  97.6 F (36.4 C)  TempSrc: Oral Oral  Oral  SpO2: 96% 100% 100% 100%  Weight:  80.9 kg (178 lb 5.6 oz)    Height:        Intake/Output Summary (Last 24 hours) at 02/04/2018 1403 Last data filed at 02/04/2018 1300 Gross per 24 hour  Intake 890 ml  Output 1215 ml  Net -325 ml   Filed Weights   02/02/18 0430 02/03/18 0536 02/04/18 0441  Weight: 81 kg (178 lb 9.6 oz) 80 kg (176 lb 5.9 oz) 80.9 kg (178 lb 5.6 oz)    Examination:  General exam:  Adult male, sitting in chair.  No acute distress.  HEENT:  NCAT, MMM Respiratory system: Clear to auscultation bilaterally Cardiovascular system: Regular rate and rhythm, normal S1/S2. No murmurs, rubs, gallops or clicks.  Warm  extremities Gastrointestinal system: Normal active bowel sounds, soft, nondistended, much less tender over the suprapubic area, less full/tense in the suprapubic area.  Trace amount of urine in the Foley catheter. MSK:  Normal tone and bulk, no lower extremity edema Neuro:  Grossly intact  Data Reviewed: I have personally reviewed following labs and imaging studies  CBC: Recent Labs  Lab 01/31/18 0153 01/31/18 0954  02/01/18 0253 02/01/18 1311 02/01/18 1837 02/02/18 0310 02/03/18 0430 02/04/18 0313  WBC 10.3 11.0*  --  10.0  --   --  9.6 9.5 9.2  NEUTROABS 8.1* 8.5*  --   --   --   --   --   --   --   HGB 10.2* 7.8*   < > 7.2* 7.5* 7.3* 7.9* 8.7* 7.6*  HCT 29.3* 22.3*   < > 20.6* 21.6* 20.9* 22.7* 26.0* 22.0*  MCV 88.0 88.5  --  88.0  --   --  88.7 89.7 89.1  PLT 13* 14*  --  29*  --   --  33* 37* 40*   < > =  values in this interval not displayed.   Basic Metabolic Panel: Recent Labs  Lab 01/31/18 0153 02/01/18 0253 02/02/18 0310 02/03/18 0430 02/04/18 0313  NA 130* 130* 134* 134* 133*  K 3.8 3.5 3.4* 4.3 4.5  CL 99* 97* 99* 100* 100*  CO2 20* 23 25 24 25   GLUCOSE 93 110* 117* 110* 107*  BUN 37* 34* 37* 35* 32*  CREATININE 1.62* 1.61* 1.58* 1.47* 1.44*  CALCIUM 8.9 8.6* 8.7* 9.2 9.0  MG  --   --  1.9  --   --    GFR: Estimated Creatinine Clearance: 39.6 mL/min (A) (by C-G formula based on SCr of 1.44 mg/dL (H)). Liver Function Tests: Recent Labs  Lab 01/31/18 0153  AST 24  ALT 16*  ALKPHOS 91  BILITOT 0.7  PROT 6.7  ALBUMIN 3.5   No results for input(s): LIPASE, AMYLASE in the last 168 hours. No results for input(s): AMMONIA in the last 168 hours. Coagulation Profile: No results for input(s): INR, PROTIME in the last 168 hours. Cardiac Enzymes: Recent Labs  Lab 01/31/18 0954 01/31/18 1511 01/31/18 2043  TROPONINI 0.09* 0.08* 0.08*   BNP (last 3 results) No results for input(s): PROBNP in the last 8760 hours. HbA1C: No results for input(s):  HGBA1C in the last 72 hours. CBG: No results for input(s): GLUCAP in the last 168 hours. Lipid Profile: No results for input(s): CHOL, HDL, LDLCALC, TRIG, CHOLHDL, LDLDIRECT in the last 72 hours. Thyroid Function Tests: No results for input(s): TSH, T4TOTAL, FREET4, T3FREE, THYROIDAB in the last 72 hours. Anemia Panel: No results for input(s): VITAMINB12, FOLATE, FERRITIN, TIBC, IRON, RETICCTPCT in the last 72 hours. Urine analysis:    Component Value Date/Time   COLORURINE YELLOW 01/31/2018 0102   APPEARANCEUR HAZY (A) 01/31/2018 0102   LABSPEC 1.013 01/31/2018 0102   PHURINE 5.0 01/31/2018 0102   GLUCOSEU NEGATIVE 01/31/2018 0102   HGBUR MODERATE (A) 01/31/2018 0102   HGBUR negative 10/15/2010 1251   BILIRUBINUR NEGATIVE 01/31/2018 0102   BILIRUBINUR n 12/01/2016 1521   KETONESUR NEGATIVE 01/31/2018 0102   PROTEINUR 100 (A) 01/31/2018 0102   UROBILINOGEN 0.2 12/01/2016 1521   UROBILINOGEN 0.2 10/15/2010 1251   NITRITE POSITIVE (A) 01/31/2018 0102   LEUKOCYTESUR SMALL (A) 01/31/2018 0102   Sepsis Labs: @LABRCNTIP (procalcitonin:4,lacticidven:4)  ) Recent Results (from the past 240 hour(s))  Culture, Urine     Status: Abnormal   Collection Time: 02/01/18  5:44 PM  Result Value Ref Range Status   Specimen Description   Final    URINE, CLEAN CATCH Performed at [facility], [place] [address], [place], [place] [address]    Special Requests   Final    NONE Performed at [facility], [place] [address] [place], [place] [address]    Culture (A)  Final    50,000 COLONIES/mL METHICILLIN RESISTANT STAPHYLOCOCCUS AUREUS   Report Status 02/04/2018 FINAL  Final   Organism ID, Bacteria METHICILLIN RESISTANT STAPHYLOCOCCUS AUREUS (A)  Final      Susceptibility   Methicillin resistant staphylococcus aureus - MIC*    CIPROFLOXACIN >=8 RESISTANT Resistant     GENTAMICIN <=0.5 SENSITIVE Sensitive     NITROFURANTOIN <=16 SENSITIVE Sensitive      OXACILLIN >=4 RESISTANT Resistant     TETRACYCLINE <=1 SENSITIVE Sensitive     VANCOMYCIN 1 SENSITIVE Sensitive     TRIMETH/SULFA <=10 SENSITIVE Sensitive     CLINDAMYCIN <=0.25 SENSITIVE Sensitive     RIFAMPIN <=0.5 SENSITIVE Sensitive  Inducible Clindamycin NEGATIVE Sensitive     * 50,000 COLONIES/mL METHICILLIN RESISTANT STAPHYLOCOCCUS AUREUS      Radiology Studies: No results found.   Scheduled Meds: . carvedilol  6.25 mg Oral BID WC  . Chlorhexidine Gluconate Cloth  6 each Topical Q0600  . feeding supplement (ENSURE ENLIVE)  237 mL Oral BID BM  . linaclotide  290 mcg Oral QAC breakfast  . multivitamin with minerals  1 tablet Oral Daily  . mupirocin ointment  1 application Nasal BID  . oxyCODONE  10 mg Oral BID  . potassium chloride  30 mEq Oral BID  . pramipexole  1 mg Oral Daily  . sodium chloride flush  3 mL Intravenous Q12H   Continuous Infusions: . sodium chloride    . cefTRIAXone (ROCEPHIN)  IV Stopped (02/04/18 1120)     LOS: 4 days    Time spent: 30 min    Janece Canterbury, MD [facility] Pager [phone]  If 7PM-7AM, please contact night-coverage [email or web address] Password [email or web address] 02/04/2018, 2:03 PM

## 2018-02-04 NOTE — Procedures (Signed)
Interventional Radiology Procedure Note  Procedure: Exchange of left PCN for a new 35F pigtail.  Exchange of right PCN for a new 37f 24cm nephroureteral drain.   Complications: None Recommendations:  - To gravity.  - Do not submerge   - Routine drain care   Signed,  Dulcy Fanny. Earleen [place], DO

## 2018-02-05 LAB — BASIC METABOLIC PANEL
ANION GAP: 8 (ref 5–15)
BUN: 27 mg/dL — ABNORMAL HIGH (ref 6–20)
CALCIUM: 9.1 mg/dL (ref 8.9–10.3)
CO2: 24 mmol/L (ref 22–32)
Chloride: 101 mmol/L (ref 101–111)
Creatinine, Ser: 1.32 mg/dL — ABNORMAL HIGH (ref 0.61–1.24)
GFR, EST AFRICAN AMERICAN: 57 mL/min — AB (ref >60)
GFR, EST NON AFRICAN AMERICAN: 49 mL/min — AB (ref >60)
Glucose, Bld: 105 mg/dL — ABNORMAL HIGH (ref 65–99)
Potassium: 5.1 mmol/L (ref 3.5–5.1)
Sodium: 133 mmol/L — ABNORMAL LOW (ref 135–145)

## 2018-02-05 LAB — CBC
HCT: 23.7 % — ABNORMAL LOW (ref 39.0–52.0)
HEMOGLOBIN: 8 g/dL — AB (ref 13.0–17.0)
MCH: 30.5 pg (ref 26.0–34.0)
MCHC: 33.8 g/dL (ref 30.0–36.0)
MCV: 90.5 fL (ref 78.0–100.0)
Platelets: 52 10*3/uL — ABNORMAL LOW (ref 150–400)
RBC: 2.62 MIL/uL — AB (ref 4.22–5.81)
RDW: 16.6 % — ABNORMAL HIGH (ref 11.5–15.5)
WBC: 10.2 10*3/uL (ref 4.0–10.5)

## 2018-02-05 MED ORDER — HEPARIN SOD (PORK) LOCK FLUSH 100 UNIT/ML IV SOLN
500.0000 [IU] | INTRAVENOUS | Status: AC | PRN
  Administered 2018-02-05: 500 [IU]

## 2018-02-05 NOTE — Discharge Summary (Addendum)
Physician Discharge Summary  Charles Coffey QVZ:[id number] DOB: 1935-05-06 DOA: 01/31/2018  PCP: Charles Morale, MD  Admit date: 01/31/2018 Discharge date: 02/05/2018  Admitted From: home  Disposition:  home  Recommendations for Outpatient Follow-up:  1. Follow up with cardiology PA on Monday.  Please repeat BMP to check creatinine and potassium.  2. Follow-up with urology in 1-2 weeks to discuss nephrostomy drains and recent urinary tract infection 3. Family to call Dr. Jeffie Coffey, Urology, on Monday to let him know how much urine came out through coude catheter 4. Dr. Alen Coffey has already scheduled follow-up regarding anemia and thrombocytopenia and cancer.   Home Health:  RN, PT/OT  Equipment/Devices:  none  Discharge Condition:  Stable, improved CODE STATUS:  Full code  Diet recommendation:  Healthy heart   Brief/Interim Summary:  Charles Coffey a 83 y.o.malewith medical history significant ofbladder cancer, coronary artery disease, hypertension who presented with shortness of breath that worsened after he got a blood transfusion last week. He also had some fever up to 102. His wife reports that he has been very confused on and off more so over the last month or so. He just was diagnosed with bladder cancer and started on chemo treatment in December 2018. Patient was referred for admission for congestive heart failure exacerbation and UTI.  He clinically improved with diuresis and ceftriaxone.  His urine culture was obtained after antibiotics were initiated and grew MRSA which is likely a colonizer or contaminant since he clinically improved on cephalosporin.  Recommend cefdinir to complete a 14 day course of antibiotics for UTI.  During his stay, his left nephrostomy drain started putting out less urine.  Bladder scan revealed acute urinary retention with 858mL in the bladder.  Case was discussed with urology.  He had a coude catheter placed by Dr. Alyson Coffey which was difficult to  place.  The following day, his left nephrostomy drain was replaced and his right nephrostomy catheter was replaced with a right nephroureteral catheter.  The patient was advised to continue all three catheters at discharge and keep track of how much uop he has through his coude catheter.  He should call the office on Monday to let Dr. Jeffie Coffey know how much urine came out through the [place].  If it's not much, the catheter may be able to be removed.  Regarding his heart failure, he was diuresed with lasix 40mg  IV BID, but he became borderline hypotensive.  His diuretics, hydralazine, and imdur were held.  His blood pressure remains low normal.  It may continue to run low and even fall lower in the setting of metastatic cancer.  I initially discussed changing his lasix from 40mg  once daily to BID with Dr. Saunders Coffey, however, given the patient's good appearance, lack of edema or SOB, and increasing uop now that his urinary obstruction has been addressed, I have asked the patient to hold his lasix, hydralazine, and imdur until his follow up with Cardiology on Monday.     Discharge Diagnoses:  Principal Problem:   Acute CHF (congestive heart failure) (HCC) Active Problems:   Essential hypertension   Cardiomyopathy, ischemic   Chronic low back pain   PAD (peripheral artery disease) (HCC)   Stage 3 chronic kidney disease (HCC)   Elevated troponin   Urothelial carcinoma of bladder (HCC)   Thrombocytopenia (HCC)   Colostomy in place [facility])   Acute lower UTI   Malnutrition of moderate degree  Pyelonephritis: Pt with fever, and UA with nitrites, hematuria and  WBC 6-30. Clinically improved with ceftriaxone -Urine culturegrew 50,000 of MRSA, likely contaminant or sensitive strain -Cefdinir x2 week course  Metabolic encephalopathy,improved with antibiotics and diuresis.  Acute on chronic systolic heart failure,HFrEF Exacerbation: echo 01/02/18 with EF 25%. Presumed ischemic cardiomyopathy, per cards,  not good candidate for cath given his comorbidities (01/04/18 c/s note).Diuresed with lasix 40mg  IV BID -  Holding diuretics due to hypotension/low BPs pending appointment in 2 days with cardiology  CAD s/p bypass with elevated troponin-flat and stable EKG. Continue to monitor as above.   Stage III CKD: continue to monitor with diuresis. Baseline creatinine fluctuating from about 1.4-1.6 recently.   Urothelial carcinoma of bladder: receiving palliative chemotherapy with gemictabine/carboplatin. Dr. Alen Coffey following. He's s/p bilateral nephrostomy tubeplacement and diverting colostomy. - Patient has elected to stop his chemotherapy - Nephroureteral catheter placed on the right and left nephrostomy tube exchanged on 02/04/2018.   -  Appreciate Urology and IR assistance assistance -  Coude catheter placed by Urology on 2/7, continue at discharge  Lower abdominal spasms, not improved with bentyl - Trial of myrbetriq  Essential hypertension now mildly hypotensive - continued coreg -  D/c hydralazine, imdur, lasix  Hyponatremia: mild, continue to monitor  Chronic low back pain-continue home dosing OxyContin 10 mg extended release twice a day and as needed Charles Coffey acting.   PAD (peripheral artery disease) (HCC)stable-  Anemia  Thrombocytopenia: Followed by oncology and related to chemotherapy. s/p platelet transfusion2/4 and1 unit of pRBCon 2/5. - Improving and can follow up with Dr. Alen Coffey as outpatient    Discharge Instructions  Discharge Instructions    Call MD for:  difficulty breathing, headache or visual disturbances   Complete by:  As directed    Call MD for:  difficulty breathing, headache or visual disturbances   Complete by:  As directed    Call MD for:  extreme fatigue   Complete by:  As directed    Call MD for:  extreme fatigue   Complete by:  As directed    Call MD for:  hives   Complete by:  As directed    Call MD for:  persistant dizziness  or light-headedness   Complete by:  As directed    Call MD for:  persistant dizziness or light-headedness   Complete by:  As directed    Call MD for:  persistant nausea and vomiting   Complete by:  As directed    Call MD for:  persistant nausea and vomiting   Complete by:  As directed    Call MD for:  severe uncontrolled pain   Complete by:  As directed    Call MD for:  severe uncontrolled pain   Complete by:  As directed    Call MD for:  temperature >100.4   Complete by:  As directed    Diet - low sodium heart healthy   Complete by:  As directed    Diet - low sodium heart healthy   Complete by:  As directed    Increase activity slowly   Complete by:  As directed    Increase activity slowly   Complete by:  As directed      Allergies as of 02/05/2018      Reactions   Antihistamines, Loratadine-type Other (See Comments)   Unable to urinate   Statins Other (See Comments)   liver effects      Medication List    STOP taking these medications   dicyclomine 10 MG capsule Commonly known  as:  BENTYL   furosemide 40 MG tablet Commonly known as:  LASIX   hydrALAZINE 25 MG tablet Commonly known as:  APRESOLINE   isosorbide mononitrate 60 MG 24 hr tablet Commonly known as:  IMDUR   temazepam 30 MG capsule Commonly known as:  RESTORIL     TAKE these medications   acetaminophen 500 MG tablet Commonly known as:  TYLENOL Take 1,000 mg by mouth every 8 (eight) hours as needed for mild pain or moderate pain.   carvedilol 6.25 MG tablet Commonly known as:  COREG Take 1 tablet (6.25 mg total) 2 (two) times daily with a meal by mouth.   cefdinir 300 MG capsule Commonly known as:  OMNICEF Take 1 capsule (300 mg total) by mouth 2 (two) times daily for 10 days.   feeding supplement (ENSURE ENLIVE) Liqd Take 237 mLs by mouth 2 (two) times daily between meals.   HEMORRHOIDAL-HC RE Place 1 application rectally as needed (hemorrhoids).   lidocaine-prilocaine cream Commonly  known as:  EMLA Apply 1 application topically as needed.   linaclotide 290 MCG Caps capsule Commonly known as:  LINZESS TAKE 1 CAPSULE (290 MCG TOTAL) BY MOUTH DAILY. 30 MINUTES PRIOR TO A MEAL   mirabegron ER 25 MG Tb24 tablet Commonly known as:  MYRBETRIQ Take 1 tablet (25 mg total) by mouth daily.   multivitamin with minerals Tabs tablet Take 1 tablet by mouth daily.   nitroGLYCERIN 0.4 MG SL tablet Commonly known as:  NITROSTAT Place 1 tablet (0.4 mg total) under the tongue every 5 (five) minutes as needed.   oxyCODONE 10 mg 12 hr tablet Commonly known as:  OXYCONTIN Take 1 tablet (10 mg total) by mouth 2 (two) times daily.   oxyCODONE-acetaminophen 10-325 MG tablet Commonly known as:  PERCOCET Take 1 tablet by mouth every 4 (four) hours as needed for pain.   pramipexole 1 MG tablet Commonly known as:  MIRAPEX Take 1 mg by mouth daily.   prochlorperazine 10 MG tablet Commonly known as:  COMPAZINE Take 1 tablet (10 mg total) by mouth every 6 (six) hours as needed for nausea or vomiting.   ranitidine 300 MG tablet Commonly known as:  ZANTAC TAKE 1 TABLET BY MOUTH TWICE A DAY   traZODone 50 MG tablet Commonly known as:  DESYREL Take 1 tablet (50 mg total) by mouth at bedtime as needed for sleep.   TYLENOL PM EXTRA STRENGTH 25-500 MG Tabs tablet Generic drug:  diphenhydramine-acetaminophen Take 1 tablet by mouth at bedtime as needed (pain,sleep).   VISINE MAXIMUM REDNESS RELIEF OP Place 2 drops into both eyes 2 (two) times daily as needed (dry eyes).      Follow-up Information    End, Harrell Gave, MD Follow up on 02/07/2018.   Specialty:  Cardiology Contact information: [place] [address] [phone]        Irine Seal, MD Follow up.   Specialty:  Urology Why:  call office on Monday to report how much urine came out through foley catheter Contact information: [place]  [id number] [phone]         Wyatt Portela, MD. Schedule an appointment as soon as possible for a visit in 1 week(s).   Specialty:  Oncology Contact information: [place] [address] [phone]        Charles Morale, MD Follow up.   Specialty:  Family Medicine Contact information: [place] [address]  [phone]        [name], MD .   Specialty:  Cardiology Contact information: [address] [place] [address] [phone]          Allergies  Allergen Reactions  . Antihistamines, Loratadine-Type Other (See Comments)    Unable to urinate  . Statins Other (See Comments)    liver effects    Consultations:  Dr. Alen Coffey   Procedures/Studies: Dg Chest 2 View  Result Date: 01/31/2018 CLINICAL DATA:  Acute onset of fever and shortness of breath. Nausea. Confusion. EXAM: CHEST  2 VIEW COMPARISON:  Chest radiograph performed 01/03/2018 FINDINGS: The lungs are well-aerated. Vascular congestion is noted. Peribronchial thickening is noted. Mild interstitial edema has improved from the prior study. There is no evidence of pleural effusion or pneumothorax. The heart is normal in size; the mediastinal contour is within normal limits. A right-sided chest port is noted ending about the distal SVC. No acute osseous abnormalities are seen. IMPRESSION: Vascular congestion. Peribronchial thickening. Mild interstitial edema has improved from the prior study. Electronically Signed   By: Garald Balding M.D.   On: 01/31/2018 01:24   Ir Nephrostomy Exchange Left  Result Date: 01/28/2018 INDICATION: 83 year old male with a history of bilateral ureteral obstruction. EXAM: IR EXCHANGE NEPHROSTOMY RIGHT; IR EXCHANGE NEPHROSTOMY LEFT COMPARISON:  CT 01/01/2018, 12/24/2017 MEDICATIONS: None ANESTHESIA/SEDATION: None CONTRAST:  20 cc-administered into the collecting system(s) FLUOROSCOPY TIME:  Fluoroscopy Time: 1 minutes 0 seconds (21.0 mGy).  COMPLICATIONS: None immediate. PROCEDURE: Informed written consent was obtained from the patient after a thorough discussion of the procedural risks, benefits and alternatives. All questions were addressed. Maximal Sterile Barrier Technique was utilized including caps, mask, sterile gowns, sterile gloves, sterile drape, hand hygiene and skin antiseptic. A timeout was performed prior to the initiation of the procedure. Patient position prone position on the table, and the indwelling bilateral percutaneous nephrostomy were prepped and draped in the usual sterile fashion along with the bilateral flank. Scout image was obtained. Left: Contrast was infused through the indwelling catheter, opacifying the collecting system. The left catheter was then ligated, and an 035 wire was advanced to the collecting system. The catheter was then removed over the wire. A new, 10 French percutaneous nephrostomy tube was advanced over the wire, with the radial opaque marker positioned at the collecting system. The wire and inner dilator/stiffener were removed, and the pigtail catheter was formed in the collecting system. Contrast was infused through the catheter confirming position. A final image was stored. Right: Contrast was infused through the indwelling catheter, opacifying the collecting system, as well as the proximal ureter. The right catheter was then ligated, and an 035 wire was advanced to the collecting system. The catheter was then removed over the wire. A new, 10 French percutaneous nephrostomy tube was advanced over the wire, with the radial opaque marker positioned at the collecting system. The wire and inner dilator/stiffener were removed, and the pigtail catheter was formed in the collecting system. Contrast was infused through the catheter confirming position. A final image was stored. The patient tolerated the procedure well and remained hemodynamically stable throughout. No complications were encountered and no  significant blood loss was encountered. IMPRESSION: Status post bilateral percutaneous nephrostomy exchange. New 10 French catheters placed bilaterally. Signed, Dulcy Fanny. Earleen Newport, DO [facility] Radiology Electronically Signed   By: Corrie Mckusick D.O.   On: 01/28/2018 16:24   Ir Nephrostomy Exchange Right  Result Date: 01/28/2018 INDICATION: 83 year old  male with a history of bilateral ureteral obstruction. EXAM: IR EXCHANGE NEPHROSTOMY RIGHT; IR EXCHANGE NEPHROSTOMY LEFT COMPARISON:  CT 01/01/2018, 12/24/2017 MEDICATIONS: None ANESTHESIA/SEDATION: None CONTRAST:  20 cc-administered into the collecting system(s) FLUOROSCOPY TIME:  Fluoroscopy Time: 1 minutes 0 seconds (21.0 mGy). COMPLICATIONS: None immediate. PROCEDURE: Informed written consent was obtained from the patient after a thorough discussion of the procedural risks, benefits and alternatives. All questions were addressed. Maximal Sterile Barrier Technique was utilized including caps, mask, sterile gowns, sterile gloves, sterile drape, hand hygiene and skin antiseptic. A timeout was performed prior to the initiation of the procedure. Patient position prone position on the table, and the indwelling bilateral percutaneous nephrostomy were prepped and draped in the usual sterile fashion along with the bilateral flank. Scout image was obtained. Left: Contrast was infused through the indwelling catheter, opacifying the collecting system. The left catheter was then ligated, and an 035 wire was advanced to the collecting system. The catheter was then removed over the wire. A new, 10 French percutaneous nephrostomy tube was advanced over the wire, with the radial opaque marker positioned at the collecting system. The wire and inner dilator/stiffener were removed, and the pigtail catheter was formed in the collecting system. Contrast was infused through the catheter confirming position. A final image was stored.  Right: Contrast was infused through the indwelling catheter, opacifying the collecting system, as well as the proximal ureter. The right catheter was then ligated, and an 035 wire was advanced to the collecting system. The catheter was then removed over the wire. A new, 10 French percutaneous nephrostomy tube was advanced over the wire, with the radial opaque marker positioned at the collecting system. The wire and inner dilator/stiffener were removed, and the pigtail catheter was formed in the collecting system. Contrast was infused through the catheter confirming position. A final image was stored. The patient tolerated the procedure well and remained hemodynamically stable throughout. No complications were encountered and no significant blood loss was encountered. IMPRESSION: Status post bilateral percutaneous nephrostomy exchange. New 10 French catheters placed bilaterally. Signed, Dulcy Fanny. Earleen Newport, DO [facility] Radiology Electronically Signed   By: Corrie Mckusick D.O.   On: 01/28/2018 16:24    Subjective:  Procedure by IR yesterday was painful, but he is feeling better today.  Had some suprapubic pain this morning that improved with myrbetriq and pain pill.  Denies SOB, swelling.    Discharge Exam: Vitals:   02/04/18 2202 02/05/18 0609  BP: 111/76 100/65  Pulse: 74 72  Resp: 16 16  Temp: 97.8 F (36.6 C) 98 F (36.7 C)  SpO2: 99% 97%   Vitals:   02/04/18 1313 02/04/18 1819 02/04/18 2202 02/05/18 0609  BP: 115/85 114/73 111/76 100/65  Pulse: 86 85 74 72  Resp: 16 16 16 16   Temp: 97.6 F (36.4 C) 98.7 F (37.1 C) 97.8 F (36.6 C) 98 F (36.7 C)  TempSrc: Oral Oral Oral Oral  SpO2: 100% 98% 99% 97%  Weight:    80.8 kg (178 lb 2.1 oz)  Height:       General exam:  Adult male, awake, alert, walking around room.  No acute distress.  HEENT:  NCAT, MMM Respiratory system: Clear to auscultation bilaterally Cardiovascular system: Regular  rate and rhythm, normal S1/S2. No murmurs, rubs, gallops or clicks.  Warm extremities Gastrointestinal system: Normal active bowel sounds, soft, nondistended, nontender. MSK:  Normal tone and bulk, no lower extremity edema Neuro:  Grossly intact Left nephrostomy drain,  right nephroureteral drain in place.  Coude catheter with a trace amount of orange-colored urine in bag since last night.     The results of significant diagnostics from this hospitalization (including imaging, microbiology, ancillary and laboratory) are listed below for reference.     Microbiology: Recent Results (from the past 240 hour(s))  Culture, Urine     Status: Abnormal   Collection Time: 02/01/18  5:44 PM  Result Value Ref Range Status   Specimen Description   Final    URINE, CLEAN CATCH Performed at [facility], [place] [address] [place], [place] [address]    Special Requests   Final    NONE Performed at [facility], [place] [address] [place], [place] [address]    Culture (A)  Final    50,000 COLONIES/mL METHICILLIN RESISTANT STAPHYLOCOCCUS AUREUS   Report Status 02/04/2018 FINAL  Final   Organism ID, Bacteria METHICILLIN RESISTANT STAPHYLOCOCCUS AUREUS (A)  Final      Susceptibility   Methicillin resistant staphylococcus aureus - MIC*    CIPROFLOXACIN >=8 RESISTANT Resistant     GENTAMICIN <=0.5 SENSITIVE Sensitive     NITROFURANTOIN <=16 SENSITIVE Sensitive     OXACILLIN >=4 RESISTANT Resistant     TETRACYCLINE <=1 SENSITIVE Sensitive     VANCOMYCIN 1 SENSITIVE Sensitive     TRIMETH/SULFA <=10 SENSITIVE Sensitive     CLINDAMYCIN <=0.25 SENSITIVE Sensitive     RIFAMPIN <=0.5 SENSITIVE Sensitive     Inducible Clindamycin NEGATIVE Sensitive     * 50,000 COLONIES/mL METHICILLIN RESISTANT STAPHYLOCOCCUS AUREUS     Labs: BNP (last 3 results) Recent Labs    01/31/18 0153  BNP 2,595.6*   Basic Metabolic Panel: Recent Labs  Lab 02/01/18 0253 02/02/18 0310  02/03/18 0430 02/04/18 0313 02/05/18 0339  NA 130* 134* 134* 133* 133*  K 3.5 3.4* 4.3 4.5 5.1  CL 97* 99* 100* 100* 101  CO2 23 25 24 25 24   GLUCOSE 110* 117* 110* 107* 105*  BUN 34* 37* 35* 32* 27*  CREATININE 1.61* 1.58* 1.47* 1.44* 1.32*  CALCIUM 8.6* 8.7* 9.2 9.0 9.1  MG  --  1.9  --   --   --    Liver Function Tests: Recent Labs  Lab 01/31/18 0153  AST 24  ALT 16*  ALKPHOS 91  BILITOT 0.7  PROT 6.7  ALBUMIN 3.5   No results for input(s): LIPASE, AMYLASE in the last 168 hours. No results for input(s): AMMONIA in the last 168 hours. CBC: Recent Labs  Lab 01/31/18 0153 01/31/18 0954  02/01/18 0253  02/01/18 1837 02/02/18 0310 02/03/18 0430 02/04/18 0313 02/05/18 0339  WBC 10.3 11.0*  --  10.0  --   --  9.6 9.5 9.2 10.2  NEUTROABS 8.1* 8.5*  --   --   --   --   --   --   --   --   HGB 10.2* 7.8*   < > 7.2*   < > 7.3* 7.9* 8.7* 7.6* 8.0*  HCT 29.3* 22.3*   < > 20.6*   < > 20.9* 22.7* 26.0* 22.0* 23.7*  MCV 88.0 88.5  --  88.0  --   --  88.7 89.7 89.1 90.5  PLT 13* 14*  --  29*  --   --  33* 37* 40* 52*   < > = values in this interval not displayed.   Cardiac Enzymes: Recent Labs  Lab 01/31/18 0954 01/31/18 1511 01/31/18 2043  TROPONINI 0.09*  0.08* 0.08*   BNP: Invalid input(s): POCBNP CBG: No results for input(s): GLUCAP in the last 168 hours. D-Dimer No results for input(s): DDIMER in the last 72 hours. Hgb A1c No results for input(s): HGBA1C in the last 72 hours. Lipid Profile No results for input(s): CHOL, HDL, LDLCALC, TRIG, CHOLHDL, LDLDIRECT in the last 72 hours. Thyroid function studies No results for input(s): TSH, T4TOTAL, T3FREE, THYROIDAB in the last 72 hours.  Invalid input(s): FREET3 Anemia work up No results for input(s): VITAMINB12, FOLATE, FERRITIN, TIBC, IRON, RETICCTPCT in the last 72 hours. Urinalysis    Component Value Date/Time   COLORURINE YELLOW 01/31/2018 0102   APPEARANCEUR HAZY (A) 01/31/2018 0102   LABSPEC 1.013  01/31/2018 0102   PHURINE 5.0 01/31/2018 0102   GLUCOSEU NEGATIVE 01/31/2018 0102   HGBUR MODERATE (A) 01/31/2018 0102   HGBUR negative 10/15/2010 1251   BILIRUBINUR NEGATIVE 01/31/2018 0102   BILIRUBINUR n 12/01/2016 1521   KETONESUR NEGATIVE 01/31/2018 0102   PROTEINUR 100 (A) 01/31/2018 0102   UROBILINOGEN 0.2 12/01/2016 1521   UROBILINOGEN 0.2 10/15/2010 1251   NITRITE POSITIVE (A) 01/31/2018 0102   LEUKOCYTESUR SMALL (A) 01/31/2018 0102   Sepsis Labs Invalid input(s): PROCALCITONIN,  WBC,  LACTICIDVEN   Time coordinating discharge: Over 30 minutes  SIGNED:   Janece Canterbury, MD  [facility] 02/05/2018, 12:20 PM Pager   If 7PM-7AM, please contact night-coverage [email or web address] Password [email or web address]

## 2018-02-05 NOTE — Progress Notes (Signed)
Pt discharge instructions reviewed with patient and wife. Placed foley cath on leg bag and provided instruction on how to attach and empty the bags. Portacath deaccessed prior to discharge. No questions or concerns at this time.

## 2018-02-06 DIAGNOSIS — I2510 Atherosclerotic heart disease of native coronary artery without angina pectoris: Secondary | ICD-10-CM | POA: Diagnosis not present

## 2018-02-06 DIAGNOSIS — Z466 Encounter for fitting and adjustment of urinary device: Secondary | ICD-10-CM | POA: Diagnosis not present

## 2018-02-06 DIAGNOSIS — Z433 Encounter for attention to colostomy: Secondary | ICD-10-CM | POA: Diagnosis not present

## 2018-02-06 DIAGNOSIS — B9562 Methicillin resistant Staphylococcus aureus infection as the cause of diseases classified elsewhere: Secondary | ICD-10-CM | POA: Diagnosis not present

## 2018-02-06 DIAGNOSIS — C679 Malignant neoplasm of bladder, unspecified: Secondary | ICD-10-CM | POA: Diagnosis not present

## 2018-02-06 DIAGNOSIS — N390 Urinary tract infection, site not specified: Secondary | ICD-10-CM | POA: Diagnosis not present

## 2018-02-06 DIAGNOSIS — Z436 Encounter for attention to other artificial openings of urinary tract: Secondary | ICD-10-CM | POA: Diagnosis not present

## 2018-02-06 DIAGNOSIS — I5023 Acute on chronic systolic (congestive) heart failure: Secondary | ICD-10-CM | POA: Diagnosis not present

## 2018-02-06 DIAGNOSIS — D631 Anemia in chronic kidney disease: Secondary | ICD-10-CM | POA: Diagnosis not present

## 2018-02-06 DIAGNOSIS — I130 Hypertensive heart and chronic kidney disease with heart failure and stage 1 through stage 4 chronic kidney disease, or unspecified chronic kidney disease: Secondary | ICD-10-CM | POA: Diagnosis not present

## 2018-02-07 ENCOUNTER — Ambulatory Visit (HOSPITAL_COMMUNITY)
Admission: RE | Admit: 2018-02-07 | Discharge: 2018-02-07 | Disposition: A | Discharge status: Home / Self Care | Payer: Medicare HMO | Source: Ambulatory Visit | Attending: Physician Assistant | Admitting: Physician Assistant

## 2018-02-07 ENCOUNTER — Other Ambulatory Visit: Payer: Self-pay | Admitting: Internal Medicine

## 2018-02-07 ENCOUNTER — Ambulatory Visit: Payer: Medicare HMO | Admitting: Physician Assistant

## 2018-02-07 ENCOUNTER — Encounter: Payer: Self-pay | Admitting: Internal Medicine

## 2018-02-07 ENCOUNTER — Encounter: Payer: Self-pay | Admitting: Physician Assistant

## 2018-02-07 ENCOUNTER — Telehealth: Payer: Self-pay | Admitting: Internal Medicine

## 2018-02-07 ENCOUNTER — Telehealth: Payer: Self-pay | Admitting: Family Medicine

## 2018-02-07 VITALS — BP 110/60 | HR 82 | Ht 68.0 in | Wt 181.0 lb

## 2018-02-07 DIAGNOSIS — I82441 Acute embolism and thrombosis of right tibial vein: Secondary | ICD-10-CM | POA: Diagnosis not present

## 2018-02-07 DIAGNOSIS — I5022 Chronic systolic (congestive) heart failure: Secondary | ICD-10-CM | POA: Diagnosis not present

## 2018-02-07 DIAGNOSIS — I82541 Chronic embolism and thrombosis of right tibial vein: Secondary | ICD-10-CM | POA: Insufficient documentation

## 2018-02-07 DIAGNOSIS — I25118 Atherosclerotic heart disease of native coronary artery with other forms of angina pectoris: Secondary | ICD-10-CM | POA: Diagnosis not present

## 2018-02-07 DIAGNOSIS — I10 Essential (primary) hypertension: Secondary | ICD-10-CM | POA: Diagnosis not present

## 2018-02-07 DIAGNOSIS — I255 Ischemic cardiomyopathy: Secondary | ICD-10-CM | POA: Diagnosis not present

## 2018-02-07 LAB — URINE CULTURE: Culture: >=100000 — AB

## 2018-02-07 MED ORDER — HYDRALAZINE HCL 25 MG PO TABS: 25.0000 mg | ORAL_TABLET | Freq: Every day | ORAL | 1 refills | Status: DC

## 2018-02-07 MED ORDER — CIPROFLOXACIN HCL 500 MG PO TABS: 500.0000 mg | ORAL_TABLET | Freq: Two times a day (BID) | ORAL | 0 refills | Status: DC

## 2018-02-07 NOTE — Progress Notes (Signed)
Right lower extremity venous duplex completed. Chronic DVT noted in the posterior tibial vein in the mid calf. No change from previous study. Unable to visualize the peroneal vein. . No evidence of a superficial thrombosis or Baker's cyst. [facility] 02/07/2018 4:17 PM

## 2018-02-07 NOTE — Telephone Encounter (Signed)
I received a MyChart message from Charles Coffey regarding his LE venous duplex. Final results are still pending, though he has a known thrombus in the right peroneal and posterior tibial veins, seen last month. Given increased erythema and pain, I have recommended that he speak with his oncologist, Dr. [name], as soon as possible to discuss the risks and benefits of anticoagulation, given his malignancy, anemia, and thrombocytopenia.  I will forward this to Dr. [name] for his review.  [name], MD [facility] Pager: [phone]

## 2018-02-07 NOTE — Patient Instructions (Addendum)
Medication Instructions:  Your physician has recommended you make the following change in your medication:  1.  START Hydralazine 25 mg daily  Labwork: None ordered  Testing/Procedures: Your physician has requested that you have a lower or upper extremity venous duplex STAT / ASAP TO RULE OUT BLOOD CLOT. This test is an ultrasound of the veins in the legs or arms. It looks at venous blood flow that carries blood from the heart to the legs or arms. Allow one hour for a Lower Venous exam. Allow thirty minutes for an Upper Venous exam. There are no restrictions or special instructions.     Follow-Up: Your physician recommends that you schedule a follow-up appointment in: DR. END'S 1ST AVAILABLE   Any Other Special Instructions Will Be Listed Below (If Applicable). Vascular Ultrasound An ultrasound, also called sonography or ultrasonography, uses harmless sound waves to take pictures of the inside of your body. The pictures are taken with a device called a transducer that is held up against your body. The continually changing pictures can be recorded on videotape or film. A vascular ultrasound is a painless test to see if you have blood flow problems or clots in your blood vessels. It may be done to look at blood vessels almost anywhere in the body. There are several types of ultrasounds that can be done to look at the blood vessels. They include:  Continuous wave Doppler ultrasound. This type of ultrasound uses the change in pitch of sound waves to provide information about blood flow through a blood vessel. During the test, a health care provider listens to the sounds produced by the transducer.  Duplex ultrasound. This type of ultrasound uses standard ultrasound methods to produce a picture of a blood vessel and surrounding organs. In addition, a computer provides information about the speed and direction of blood flow through the blood vessel. With this type of ultrasound it is possible to  see the structures inside the body and to evaluate blood flow within those structures at the same time.  Color Doppler ultrasound. This type of ultrasound uses standard ultrasound methods to produce a picture of a blood vessel. In addition, a computer converts the Doppler sounds into colors that are overlaid on the picture of the blood vessel. These colors represent the speed and direction of blood flow through the vessel.  Power Doppler ultrasound. This type of ultrasound is up to five times more sensitive than color Doppler ultrasound. Power Doppler ultrasound can also get pictures that are difficult or impossible to get using standard color Doppler ultrasound. Power Doppler ultrasound is most commonly used to evaluate blood flow through vessels within organs, such as the liver or kidneys.  Transcranial Doppler ultrasound. This type of ultrasound looks at blood flow in blood vessels throughout the brain. It can reveal the presence of narrow arteries, clots blocking the vessels, or malformed blood vessels.  What are the risks? There are no known risks or complications of having an ultrasound. What happens before the procedure?  If the ultrasound scan involves your upper abdomen, you may be directed not to eat, smoke, or chew gum the morning of your exam. Follow your health care provider's instructions.  During the test, a gel will be applied to your skin. Wear clothing that is easily washable in case the gel gets on your clothes. What happens during the procedure?  A gel will be applied to your skin. It may feel cool.  The transducer will be placed on the area to  be examined.  Pictures will be taken. They will be displayed on one or more monitors that look like small television screens. What happens after the procedure?  You can safely drive home and return to regular activities immediately after your exam.  Keep follow-up visits as directed by your health care provider.  Ask when your  test results will be ready. It is your responsibility to get your test results. This information is not intended to replace advice given to you by your health care provider. Make sure you discuss any questions you have with your health care provider. Document Released: 12/25/2004 Document Revised: 05/21/2016 Document Reviewed: 03/08/2014 Elsevier Interactive Patient Education  [employer].     If you need a refill on your cardiac medications before your next appointment, please call your pharmacy.

## 2018-02-07 NOTE — Progress Notes (Signed)
Urine culture from bladder when coude catheter was placed grew pseudomonas.  No UA was performed at the time, but pus came out when catheter was placed.  He is still having bladder pains and feeling fatigued, however, he has bladder pains from his urothelial cancer.  Difficult to tell if this reflects lingering infection despite his initial clinical improvement with 3rd gen cephalosporin.  Called wife and will call in a prescription for ciprofloxacin to replace his cefdinir for the next 7-10 days.    Wife told me he is having some worsening right leg pain and swelling, probably an extension of his previous DVT diagnosed in January.  He is currently in radiology having an ultrasound of his leg.  His platelet count had been rising and was 52 000 on date of discharge.  He does not have any invasive procedures scheduled and it is not going to undergo any more chemotherapy, so likely safe to start lovenox anticoagulation 1.5mg /kg per day for VTE in setting of malignancy.

## 2018-02-07 NOTE — Progress Notes (Signed)
Cardiology Office Note    Date:  02/07/2018   ID:  Charles Coffey, DOB Mar 29, 1935, MRN [id number]  PCP:  [name], MD  Cardiologist: [name], MD  No chief complaint on file.   History of Present Illness:  Charles Coffey is a 83 y.o. male male with history of CAD status post [place], CABG x3 in 2002, cath 07/2017 patent LIMA to the LAD and SVG to the OM 2, RIMA to the RCA was atretic and RCA is occluded distal with extensive collaterals from the LAD.  Medical therapy recommended.  Ischemic cardiomyopathy ejection fraction 25-30%, hypertension, HLD, recent diagnosis of invasive urethral carcinoma.    Discharge from the hospital 01/04/18 after admission with sepsis secondary to UTI and NSTEMI without chest pain but did have dyspnea on exertion and peak troponin of 26.62.  2D echo showed newly decreased LV function EF 25-30% previously 50% 08/2017.Marland Kitchen  Unfortunately given his disease process and pancytopenia patient was not a good candidate for cardiac catheterization.  He could not proceed with PCI if indicated as this would require DA PT which is contraindicated in the setting of thrombocytopenia.  Medical therapy recommended.  Intolerant to statins and was referred to the lipid clinic for [place].  He also had an acute DVT in the posterior tibial vein and peroneal vein.   I saw the patient in follow-up 01/11/18 at which time he was quite weak.  He was having orthopnea and Dr. Saunders Revel had increased his Lasix to 80 mg daily which improved his breathing and edema.  He was getting extra salt in his diet.  Not taking aspirin because of low platelets.  Patient was just discharged from the hospital 02/03/18 after admission with shortness of breath after blood transfusion as well as fever up to 102 felt secondary to UTI.  He diuresed with IV Lasix but was not sent home on Lasix hydralazine or Imdur because of hypotension.  Patient comes in today accompanied by his wife.  He has chronic dyspnea  on exertion but overall his weight has been stable since discharge from the hospital on Saturday which was 2 days ago.  He has developed worsening swelling behind his knee and down his right calf where he had his previous blood clot.  Also has pain and discomfort in this right leg.  Last platelet count 52,000.  Has chosen not to go through chemo.   Past Medical History:  Diagnosis Date  . Aortic atherosclerosis ([place])   . BPH with urinary obstruction   . CAD (coronary artery disease)    a.  MI 1995, CABG x 3 2002 (patient says that he had LIMA and RIMA grafts). b. ETT-Cardiolite (10/15) with EF 48%, apical scar, no ischemia. c. Nuc 07/2017 abnormal -> cath was performed,  patent LIMA to LAD and SVG to OM 2. RIMA to RCA is atretic. The right coronary artery is occluded distally with extensive collaterals from the LAD, medical therapy.   . Cancer [facility])    bladder cancer  . Cervical spondylosis without myelopathy 2020-02-1316  . Chronic low back pain    sees Dr. Kary Kos   . GERD (gastroesophageal reflux disease)   . Hiatal hernia   . Hyperlipidemia   . Hypertension   . IBS (irritable bowel syndrome)   . Ischemic cardiomyopathy   . Myocardial infarction ([place])   . RBBB   . Restless legs syndrome   . Statin intolerance   . Syncope    a.  in 2015 - no apparent cause, was taking sleep medicine at the time. Cardiac workup unremarkable.  . Tubular adenoma of colon     Past Surgical History:  Procedure Laterality Date  . COLONOSCOPY  10/17/2014   per Dr. Hilarie Fredrickson, tubular adenomas, repeat in 3 yrs   . COLONOSCOPY WITH PROPOFOL N/A 12/01/2017   Procedure: COLONOSCOPY WITH PROPOFOL;  Surgeon: Milus Banister, MD;  Location: [facility];  Service: Endoscopy;  Laterality: N/A;  . CYSTOSCOPY W/ RETROGRADES Left 11/25/2017   Procedure: CYSTOSCOPY WITH RETROGRADE PYELOGRAM;  Surgeon: Irine Seal, MD;  Location: [facility];  Service: Urology;  Laterality: Left;  . HEART BYPASS    . IR FLUORO GUIDE CV LINE  RIGHT  12/27/2017  . IR NEPHROSTOGRAM LEFT THRU EXISTING ACCESS  12/24/2017  . IR NEPHROSTOMY EXCHANGE LEFT  01/28/2018  . IR NEPHROSTOMY EXCHANGE RIGHT  12/24/2017  . IR NEPHROSTOMY EXCHANGE RIGHT  01/28/2018  . IR NEPHROSTOMY PLACEMENT LEFT  11/28/2017  . IR NEPHROSTOMY PLACEMENT RIGHT  12/03/2017  . IR US GUIDE VASC ACCESS RIGHT  12/27/2017  . LAPAROSCOPY N/A 12/01/2017   Procedure: LAPAROSCOPIC DIVERTING OSTOMY;  Surgeon: Stark Klein, MD;  Location: [facility];  Service: General;  Laterality: N/A;  . LEFT HEART CATH AND CORS/GRAFTS ANGIOGRAPHY N/A 08/25/2017   Procedure: LEFT HEART CATH AND CORS/GRAFTS ANGIOGRAPHY;  Surgeon: Wellington Hampshire, MD;  Location: [place] [facility];  Service: Cardiovascular;  Laterality: N/A;  . LUMBAR FUSION  2003   L3-L4  . PROSTATE SURGERY  06-27-12   per Dr. Roni Bread, had CTT  . TONSILLECTOMY    . TRANSURETHRAL RESECTION OF BLADDER TUMOR N/A 11/25/2017   Procedure: TRANSURETHRAL RESECTION OF BLADDER TUMOR (TURBT);  Surgeon: Irine Seal, MD;  Location: [facility];  Service: Urology;  Laterality: N/A;    Current Medications: Current Meds  Medication Sig  . acetaminophen (TYLENOL) 500 MG tablet Take 1,000 mg by mouth every 8 (eight) hours as needed for mild pain or moderate pain.  . carvedilol (COREG) 6.25 MG tablet Take 1 tablet (6.25 mg total) 2 (two) times daily with a meal by mouth.  . cefdinir (OMNICEF) 300 MG capsule Take 1 capsule (300 mg total) by mouth 2 (two) times daily for 10 days.  . diphenhydramine-acetaminophen (TYLENOL PM EXTRA STRENGTH) 25-500 MG TABS tablet Take 1 tablet by mouth at bedtime as needed (pain,sleep).   . feeding supplement, ENSURE ENLIVE, (ENSURE ENLIVE) LIQD Take 237 mLs by mouth 2 (two) times daily between meals.  . Hydrocortisone Acetate (HEMORRHOIDAL-HC RE) Place 1 application rectally as needed (hemorrhoids).  . lidocaine-prilocaine (EMLA) cream Apply 1 application topically as needed.  . linaclotide (LINZESS) 290 MCG CAPS capsule TAKE  1 CAPSULE (290 MCG TOTAL) BY MOUTH DAILY. 30 MINUTES PRIOR TO A MEAL  . mirabegron ER (MYRBETRIQ) 25 MG TB24 tablet Take 1 tablet (25 mg total) by mouth daily.  . Multiple Vitamin (MULTIVITAMIN WITH MINERALS) TABS tablet Take 1 tablet by mouth daily.  Marland Kitchen oxyCODONE (OXYCONTIN) 10 mg 12 hr tablet Take 1 tablet (10 mg total) by mouth 2 (two) times daily.  Marland Kitchen oxyCODONE-acetaminophen (PERCOCET) 10-325 MG tablet Take 1 tablet by mouth every 4 (four) hours as needed for pain.  . pramipexole (MIRAPEX) 1 MG tablet Take 1 mg by mouth daily.  . prochlorperazine (COMPAZINE) 10 MG tablet Take 1 tablet (10 mg total) by mouth every 6 (six) hours as needed for nausea or vomiting.  . ranitidine (ZANTAC) 300 MG tablet TAKE 1 TABLET BY MOUTH  TWICE A DAY  . Tetrahydroz-Glyc-Hyprom-PEG (VISINE MAXIMUM REDNESS RELIEF OP) Place 2 drops into both eyes 2 (two) times daily as needed (dry eyes).   . traZODone (DESYREL) 50 MG tablet Take 1 tablet (50 mg total) by mouth at bedtime as needed for sleep.     Allergies:   Antihistamines, loratadine-type and Statins   Social History   Socioeconomic History  . Marital status: Married    Spouse name: None  . Number of children: 5  . Years of education: Some coll  . Highest education level: None  Social Needs  . Financial resource strain: None  . Food insecurity - worry: None  . Food insecurity - inability: None  . Transportation needs - medical: None  . Transportation needs - non-medical: None  Occupational History  . Occupation: retired  Tobacco Use  . Smoking status: Former Smoker    Types: Cigarettes    Last attempt to quit: 12/28/1978    Years since quitting: 39.1  . Smokeless tobacco: Never Used  Substance and Sexual Activity  . Alcohol use: Yes    Alcohol/week: 1.2 oz    Types: 1 Glasses of wine, 1 Cans of beer per week    Comment: occassional  . Drug use: No  . Sexual activity: No  Other Topics Concern  . None  Social History Narrative   Lives at home  w/ his wife   Right-handed   5 cups of coffee per day     Family History:  The patient's family history includes Aneurysm in his father; Heart attack in his mother; Heart disease in his brother and maternal uncle.   ROS:   Please see the history of present illness.    Review of Systems  Constitution: Positive for decreased appetite, weakness, malaise/fatigue and weight loss.  HENT: Negative.   Cardiovascular: Negative.   Respiratory: Negative.   Endocrine: Negative.   Hematologic/Lymphatic: Bruises/bleeds easily.  Musculoskeletal: Positive for back pain.  Gastrointestinal: Positive for diarrhea.  Genitourinary: Negative.    All other systems reviewed and are negative.   PHYSICAL EXAM:   VS:  BP 110/60   Pulse 82   Ht 5\' 8"  (1.727 m)   Wt 181 lb (82.1 kg)   SpO2 94%   BMI 27.52 kg/m   Physical Exam  GEN: Tired and worn out Neck: no JVD, carotid bruits, or masses Cardiac:RRR; positive S4, 2/6 systolic murmur left sternal border Respiratory:  clear to auscultation bilaterally, normal work of breathing GI: soft, nontender, nondistended, + BS Ext: Behind his right knee is swollen red and tender with swelling down his right calf, left leg without cyanosis, clubbing, or edema, Good distal pulses bilaterally Neuro:  Alert and Oriented x 3 Psych: euthymic mood, full affect  Wt Readings from Last 3 Encounters:  02/07/18 181 lb (82.1 kg)  02/05/18 178 lb 2.1 oz (80.8 kg)  01/20/18 184 lb 8 oz (83.7 kg)      Studies/Labs Reviewed:   EKG:  EKG is not ordered today.   Recent Labs: 08/19/2017: TSH 1.090 01/31/2018: ALT 16; B Natriuretic Peptide 1,472.0 02/02/2018: Magnesium 1.9 02/05/2018: BUN 27; Creatinine, Ser 1.32; Hemoglobin 8.0; Platelets 52; Potassium 5.1; Sodium 133   Lipid Panel    Component Value Date/Time   CHOL 122 09/21/2017 1521   TRIG 79 09/21/2017 1521   HDL 41 09/21/2017 1521   CHOLHDL 3.0 09/21/2017 1521   CHOLHDL 3 12/01/2016 0827   VLDL 17.0 12/01/2016  0827   LDLCALC 65 09/21/2017 1521  LDLDIRECT 72 09/21/2017 1521    Additional studies/ records that were reviewed today include:    ECHO 01/02/18: Study Conclusions   - Left ventricle: The cavity size was mildly dilated. Wall   thickness was normal. Systolic function was severely reduced. The   estimated ejection fraction was in the range of 25% to 30%.   Diffuse hypokinesis. Features are consistent with a pseudonormal   left ventricular filling pattern, with concomitant abnormal   relaxation and increased filling pressure (grade 2 diastolic   dysfunction). - Mitral valve: There was mild regurgitation. - Left atrium: The atrium was moderately to severely dilated. - Right ventricle: Systolic function was mildly to moderately   reduced. - Pulmonary arteries: PA peak pressure: 57 mm Hg (S).    Final Interpretation Right: There is evidence of acute DVT in the Posterior Tibial vein, and Peroneal vein. Acute deep vein thrombus noted in one of the posterior tibial veins coursing from approximately 3 inches above the ankle to mid calf. There is also noted an acute deep  vein thrombous in the mid calf. There is no evidence of a suoerficial thrombosis. There is no evidence of a Baker's cyst. Left: There is no evidence of deep vein thrombosis in the lower extremity.There is no evidence of superficial venous thrombosis. There is no evidence of a Baker's cyst.     ASSESSMENT:    1. Cardiomyopathy, ischemic   2. Coronary artery disease of native artery of native heart with stable angina pectoris ([place])   3. Chronic systolic CHF (congestive heart failure) ([place])   4. Essential hypertension   5. Deep vein thrombosis (DVT) of tibial vein of right lower extremity, unspecified chronicity ([facility])      PLAN:  In order of problems listed above:  Ischemic cardiomyopathy ejection fraction 25-30% with recent NSTEMI treated conservatively because of thrombocytopenia, metastatic bladder cancer and  sepsis.  Patient not having any more chemo.  Cath 07/2017 LIMA to the LAD and SVG to the OM 2 was patent, RIMA to the RCA atretic and RCA was occluded distally with extensive collaterals from the LAD.  Follow-up with Dr. Saunders Revel in a couple weeks.  CAD with previous CABG and recent MI in the hospital with troponins over 26 without acute ST elevation but new LV dysfunction.  Medical management recommended.  Chronic systolic CHF with recent hospitalization for CHF after blood transfusion diuresed with IV Lasix but not sent home on Lasix because of hypotension.  Blood pressure better today.  Will restart low-dose hydralazine 25 mg once a day.  Have instructed them that they think he can take Lasix 40 mg as needed for weight gain, edema or worsening shortness of breath.  Essential hypertension blood pressures been low so medication titration has been difficult   Right lower extremity DVT 01/04/18 not a candidate for IVC filter and cannot have full anticoagulation because of thrombocytopenia.  It was felt to be low risk for proximal propagation.  Now with worsening pain and swelling behind his right knee and down his leg.  We will recheck Dopplers today.  Last platelet count 52,000.  Medication Adjustments/Labs and Tests Ordered: Current medicines are reviewed at length with the patient today.  Concerns regarding medicines are outlined above.  Medication changes, Labs and Tests ordered today are listed in the Patient Instructions below. There are no Patient Instructions on file for this visit.   Signed, Ermalinda Barrios, PA-C  02/07/2018 11:39 AM    [facility] [address], [place], [place]  [address] Phone: [phone]; Fax: [phone]

## 2018-02-07 NOTE — Telephone Encounter (Signed)
I spoke with wife [name] and she asked to please give a call back tomorrow on Tuesday, pt is at radiology right now.

## 2018-02-07 NOTE — Addendum Note (Signed)
Addended by: Gaetano Net on: 02/07/2018 12:04 PM   Modules accepted: Orders

## 2018-02-08 ENCOUNTER — Ambulatory Visit: Payer: Medicare HMO | Admitting: Physician Assistant

## 2018-02-08 ENCOUNTER — Encounter: Payer: Self-pay | Admitting: Oncology

## 2018-02-08 DIAGNOSIS — I5023 Acute on chronic systolic (congestive) heart failure: Secondary | ICD-10-CM | POA: Diagnosis not present

## 2018-02-08 DIAGNOSIS — Z436 Encounter for attention to other artificial openings of urinary tract: Secondary | ICD-10-CM | POA: Diagnosis not present

## 2018-02-08 DIAGNOSIS — I2510 Atherosclerotic heart disease of native coronary artery without angina pectoris: Secondary | ICD-10-CM | POA: Diagnosis not present

## 2018-02-08 DIAGNOSIS — N390 Urinary tract infection, site not specified: Secondary | ICD-10-CM | POA: Diagnosis not present

## 2018-02-08 DIAGNOSIS — Z433 Encounter for attention to colostomy: Secondary | ICD-10-CM | POA: Diagnosis not present

## 2018-02-08 DIAGNOSIS — C679 Malignant neoplasm of bladder, unspecified: Secondary | ICD-10-CM | POA: Diagnosis not present

## 2018-02-08 DIAGNOSIS — Z466 Encounter for fitting and adjustment of urinary device: Secondary | ICD-10-CM | POA: Diagnosis not present

## 2018-02-08 DIAGNOSIS — I130 Hypertensive heart and chronic kidney disease with heart failure and stage 1 through stage 4 chronic kidney disease, or unspecified chronic kidney disease: Secondary | ICD-10-CM | POA: Diagnosis not present

## 2018-02-08 DIAGNOSIS — B9562 Methicillin resistant Staphylococcus aureus infection as the cause of diseases classified elsewhere: Secondary | ICD-10-CM | POA: Diagnosis not present

## 2018-02-08 DIAGNOSIS — D631 Anemia in chronic kidney disease: Secondary | ICD-10-CM | POA: Diagnosis not present

## 2018-02-08 NOTE — Telephone Encounter (Signed)
Transition Care Management Follow-up Telephone Call  Physician Discharge Summary  Charles Coffey MAY:[id number] DOB: 07/24/1935 DOA: 01/31/2018  PCP: [name], MD  Admit date: 01/31/2018 Discharge date: 02/05/2018  Admitted From: home  Disposition:  home  Recommendations for Outpatient Follow-up:  1. Follow up with cardiology PA on Monday.  Please repeat BMP to check creatinine and potassium.  2. Follow-up with urology in 1-2 weeks to discuss nephrostomy drains and recent urinary tract infection 3. Family to call Dr. [name], Urology, on Monday to let him know how much urine came out through coude catheter 4. Dr. [name] has already scheduled follow-up regarding anemia and thrombocytopenia and cancer.   Home Health:  RN, PT/OT  Equipment/Devices:  none  Discharge Condition:  Stable, improved    How have you been since you were released from the hospital? "better"   Do you understand why you were in the hospital? yes   Do you understand the discharge instructions? yes   Where were you discharged to? Home  Items Reviewed:  Medications reviewed: yes  Allergies reviewed: yes  Dietary changes reviewed: yes  Referrals reviewed: yes   Functional Questionnaire:   Activities of Daily Living (ADLs):   He states they are independent in the following: ambulation, bathing and hygiene, feeding, continence, grooming, toileting and dressing States they require assistance with the following: none   Any transportation issues/concerns?: no   Any patient concerns? no   Confirmed importance and date/time of follow-up visits scheduled yes  Provider Appointment booked with Dr. Sarajane Jews on 02/14/2018 Monday at 3:45 pm  Confirmed with patient if condition begins to worsen call PCP or go to the ER.  Patient was given the office number and encouraged to call back with question or concerns.  : yes

## 2018-02-08 NOTE — Telephone Encounter (Signed)
I will discuss this issue with him as well. Thanks.

## 2018-02-08 NOTE — Telephone Encounter (Signed)
I spoke with both Mr. and Mrs. Charles Coffey.  I apologized for them not being able to get through to the phone lines.  I had spoken with Dr. Saunders Revel last evening who called [name] last night and made him aware of the Doppler results.  Dr. [name] is also aware and will be making a decision on whether or not he will treat the DVT based on his current thrombocytopenia.  Patient and wife for grateful for my phone call.

## 2018-02-09 ENCOUNTER — Telehealth: Payer: Self-pay | Admitting: *Deleted

## 2018-02-09 NOTE — Telephone Encounter (Signed)
Spoke with wife lenny. She wanted to cancel all appts here at the cancer center. Suggested she keep the next lab and dr visit appt and discuss with dr [name] future appts.

## 2018-02-10 ENCOUNTER — Other Ambulatory Visit: Payer: Self-pay | Admitting: Internal Medicine

## 2018-02-10 ENCOUNTER — Ambulatory Visit: Payer: Medicare HMO

## 2018-02-10 ENCOUNTER — Other Ambulatory Visit: Payer: Medicare HMO

## 2018-02-10 DIAGNOSIS — R339 Retention of urine, unspecified: Secondary | ICD-10-CM | POA: Diagnosis not present

## 2018-02-10 DIAGNOSIS — C662 Malignant neoplasm of left ureter: Secondary | ICD-10-CM | POA: Diagnosis not present

## 2018-02-10 DIAGNOSIS — C670 Malignant neoplasm of trigone of bladder: Secondary | ICD-10-CM | POA: Diagnosis not present

## 2018-02-10 DIAGNOSIS — N130 Hydronephrosis with ureteropelvic junction obstruction: Secondary | ICD-10-CM | POA: Diagnosis not present

## 2018-02-11 ENCOUNTER — Ambulatory Visit: Payer: Medicare HMO

## 2018-02-11 ENCOUNTER — Encounter: Payer: Self-pay | Admitting: *Deleted

## 2018-02-11 ENCOUNTER — Telehealth: Payer: Self-pay | Admitting: Physician Assistant

## 2018-02-11 DIAGNOSIS — I5023 Acute on chronic systolic (congestive) heart failure: Secondary | ICD-10-CM | POA: Diagnosis not present

## 2018-02-11 DIAGNOSIS — I2510 Atherosclerotic heart disease of native coronary artery without angina pectoris: Secondary | ICD-10-CM | POA: Diagnosis not present

## 2018-02-11 DIAGNOSIS — Z433 Encounter for attention to colostomy: Secondary | ICD-10-CM | POA: Diagnosis not present

## 2018-02-11 DIAGNOSIS — R339 Retention of urine, unspecified: Secondary | ICD-10-CM | POA: Diagnosis not present

## 2018-02-11 DIAGNOSIS — D631 Anemia in chronic kidney disease: Secondary | ICD-10-CM | POA: Diagnosis not present

## 2018-02-11 DIAGNOSIS — I130 Hypertensive heart and chronic kidney disease with heart failure and stage 1 through stage 4 chronic kidney disease, or unspecified chronic kidney disease: Secondary | ICD-10-CM | POA: Diagnosis not present

## 2018-02-11 DIAGNOSIS — B9562 Methicillin resistant Staphylococcus aureus infection as the cause of diseases classified elsewhere: Secondary | ICD-10-CM | POA: Diagnosis not present

## 2018-02-11 DIAGNOSIS — Z466 Encounter for fitting and adjustment of urinary device: Secondary | ICD-10-CM | POA: Diagnosis not present

## 2018-02-11 DIAGNOSIS — N390 Urinary tract infection, site not specified: Secondary | ICD-10-CM | POA: Diagnosis not present

## 2018-02-11 DIAGNOSIS — Z436 Encounter for attention to other artificial openings of urinary tract: Secondary | ICD-10-CM | POA: Diagnosis not present

## 2018-02-11 DIAGNOSIS — C679 Malignant neoplasm of bladder, unspecified: Secondary | ICD-10-CM | POA: Diagnosis not present

## 2018-02-11 NOTE — Telephone Encounter (Signed)
New message   Pt c/o medication issue:  1. Name of Medication: Lasix  2. How are you currently taking this medication (dosage and times per day)? As needed  3. Are you having a reaction (difficulty breathing--STAT)? no  4. What is your medication issue? Pt says the hospital took him off lasix but Lenze told him to take as needed and she has more questions regarding the instructions and prescription

## 2018-02-11 NOTE — Telephone Encounter (Signed)
Wife called to get clarification on patient's lasix direction. Wife advised that per last office note, he can take lasix 40 mg prn for edema or increased sob. Wife verbalized understanding of plan.

## 2018-02-13 ENCOUNTER — Encounter: Payer: Self-pay | Admitting: Oncology

## 2018-02-14 ENCOUNTER — Ambulatory Visit (INDEPENDENT_AMBULATORY_CARE_PROVIDER_SITE_OTHER): Payer: Medicare HMO | Admitting: Family Medicine

## 2018-02-14 ENCOUNTER — Encounter: Payer: Self-pay | Admitting: Family Medicine

## 2018-02-14 VITALS — BP 118/54 | HR 87 | Temp 97.6°F | Ht 68.0 in | Wt 179.0 lb

## 2018-02-14 DIAGNOSIS — Z87440 Personal history of urinary (tract) infections: Secondary | ICD-10-CM | POA: Diagnosis not present

## 2018-02-14 DIAGNOSIS — N12 Tubulo-interstitial nephritis, not specified as acute or chronic: Secondary | ICD-10-CM

## 2018-02-14 DIAGNOSIS — A419 Sepsis, unspecified organism: Secondary | ICD-10-CM | POA: Diagnosis not present

## 2018-02-14 DIAGNOSIS — N183 Chronic kidney disease, stage 3 unspecified: Secondary | ICD-10-CM

## 2018-02-14 DIAGNOSIS — D696 Thrombocytopenia, unspecified: Secondary | ICD-10-CM | POA: Diagnosis not present

## 2018-02-14 DIAGNOSIS — N390 Urinary tract infection, site not specified: Secondary | ICD-10-CM

## 2018-02-14 DIAGNOSIS — I10 Essential (primary) hypertension: Secondary | ICD-10-CM | POA: Diagnosis not present

## 2018-02-14 DIAGNOSIS — C679 Malignant neoplasm of bladder, unspecified: Secondary | ICD-10-CM | POA: Diagnosis not present

## 2018-02-14 NOTE — Progress Notes (Signed)
   Subjective:    Patient ID: Charles Coffey, male    DOB: [birth year], 83 y.o.   MRN: [id number]  HPI Here to follow up a hospital stay from 01-31-18 to 02-05-18 for a UTI and acute on chronic CHF. Shortly after receiving a blood transfusion he developed SOB and fever. A urine culture grew MRSA but this was felt to be a contaminant. He was on Cefdinir, but a subsequent urine culture grew Pseudomonas, so he was switched to Cipro for 10 days.  His left nephrostomy tube was replaced and an indwelling bladder catheter was placed. He was diuresed with Lasix and his swelling and SOB improved. His EF at that time was 25%. He was sent home off Lasix, off Imdur, off hydralazine, and off Lisinopril.  He was found to have a DVT in the right leg and this caused pain and swelling in the leg. This is chronic however and does not require treatment. He then saw the Cardiology office on 02-07-18. They started him back on Hydralazine 25 mg daily. They told him to take Lasix 40 mg once daily as needed for fluid build up. He is followed by Dr. [name] for invasive urothelial carcinoma but he has decided not to have chemotherapy. He will see Radiation Therapy tomorrow. His anemia and thrombocytopenia are stable. He saw Dr. [name] last week and he removed the Foley catheter.    Review of Systems  Constitutional: Negative.   Respiratory: Negative.   Cardiovascular: Negative.   Gastrointestinal: Negative.   Genitourinary: Negative.   Neurological: Negative.        Objective:   Physical Exam  Constitutional: He appears well-developed and well-nourished.  Neck: No thyromegaly present.  Cardiovascular: Normal rate, regular rhythm, normal heart sounds and intact distal pulses.  Pulmonary/Chest: Effort normal and breath sounds normal. No respiratory distress. He has no wheezes. He has no rales.  Abdominal: Soft. Bowel sounds are normal. He exhibits no distension and no mass. There is no tenderness. There is no rebound and no  guarding.  Musculoskeletal:  2+ edema in both ankles and feet   Lymphadenopathy:    He has no cervical adenopathy.          Assessment & Plan:  He has recovered from the UTI. He is seeing Urology for the urothelial carcinoma and he still has bilateral nephrostomies in place. His acute CHF exacerbation has resolved. He will use Lasix on an as needed basis for either leg swelling, or SOB, or more than a 2 lb weight gain in 24 hours. We will get a BMET today to check the creatinine and potassium. His BP is stable. He will see Oncology tomorrow to address the anemia and thrombocytopenia.  Alysia Penna, MD

## 2018-02-15 ENCOUNTER — Telehealth: Payer: Self-pay | Admitting: Oncology

## 2018-02-15 ENCOUNTER — Inpatient Hospital Stay: Payer: Medicare HMO

## 2018-02-15 ENCOUNTER — Inpatient Hospital Stay: Payer: Medicare HMO | Attending: Oncology | Admitting: Oncology

## 2018-02-15 VITALS — BP 109/59 | HR 72 | Temp 97.7°F | Resp 18 | Ht 68.0 in | Wt 179.3 lb

## 2018-02-15 DIAGNOSIS — C679 Malignant neoplasm of bladder, unspecified: Secondary | ICD-10-CM | POA: Diagnosis not present

## 2018-02-15 DIAGNOSIS — D630 Anemia in neoplastic disease: Secondary | ICD-10-CM

## 2018-02-15 DIAGNOSIS — C7951 Secondary malignant neoplasm of bone: Secondary | ICD-10-CM

## 2018-02-15 DIAGNOSIS — I5023 Acute on chronic systolic (congestive) heart failure: Secondary | ICD-10-CM | POA: Diagnosis not present

## 2018-02-15 DIAGNOSIS — D6481 Anemia due to antineoplastic chemotherapy: Secondary | ICD-10-CM | POA: Diagnosis not present

## 2018-02-15 DIAGNOSIS — C785 Secondary malignant neoplasm of large intestine and rectum: Secondary | ICD-10-CM

## 2018-02-15 DIAGNOSIS — D631 Anemia in chronic kidney disease: Secondary | ICD-10-CM | POA: Diagnosis not present

## 2018-02-15 DIAGNOSIS — B9562 Methicillin resistant Staphylococcus aureus infection as the cause of diseases classified elsewhere: Secondary | ICD-10-CM | POA: Diagnosis not present

## 2018-02-15 DIAGNOSIS — N1330 Unspecified hydronephrosis: Secondary | ICD-10-CM

## 2018-02-15 DIAGNOSIS — Z466 Encounter for fitting and adjustment of urinary device: Secondary | ICD-10-CM | POA: Diagnosis not present

## 2018-02-15 DIAGNOSIS — C786 Secondary malignant neoplasm of retroperitoneum and peritoneum: Secondary | ICD-10-CM | POA: Diagnosis not present

## 2018-02-15 DIAGNOSIS — I82409 Acute embolism and thrombosis of unspecified deep veins of unspecified lower extremity: Secondary | ICD-10-CM | POA: Diagnosis not present

## 2018-02-15 DIAGNOSIS — G893 Neoplasm related pain (acute) (chronic): Secondary | ICD-10-CM | POA: Diagnosis not present

## 2018-02-15 DIAGNOSIS — Z433 Encounter for attention to colostomy: Secondary | ICD-10-CM | POA: Diagnosis not present

## 2018-02-15 DIAGNOSIS — Z436 Encounter for attention to other artificial openings of urinary tract: Secondary | ICD-10-CM | POA: Diagnosis not present

## 2018-02-15 DIAGNOSIS — D649 Anemia, unspecified: Secondary | ICD-10-CM

## 2018-02-15 DIAGNOSIS — N390 Urinary tract infection, site not specified: Secondary | ICD-10-CM | POA: Diagnosis not present

## 2018-02-15 DIAGNOSIS — D6959 Other secondary thrombocytopenia: Secondary | ICD-10-CM

## 2018-02-15 DIAGNOSIS — I2510 Atherosclerotic heart disease of native coronary artery without angina pectoris: Secondary | ICD-10-CM | POA: Diagnosis not present

## 2018-02-15 DIAGNOSIS — Z5111 Encounter for antineoplastic chemotherapy: Secondary | ICD-10-CM | POA: Diagnosis present

## 2018-02-15 DIAGNOSIS — I130 Hypertensive heart and chronic kidney disease with heart failure and stage 1 through stage 4 chronic kidney disease, or unspecified chronic kidney disease: Secondary | ICD-10-CM | POA: Diagnosis not present

## 2018-02-15 DIAGNOSIS — Z95828 Presence of other vascular implants and grafts: Secondary | ICD-10-CM

## 2018-02-15 LAB — CBC WITH DIFFERENTIAL (CANCER CENTER ONLY)
BASOS ABS: 0.1 10*3/uL (ref 0.0–0.1)
Basophils Relative: 2 %
Eosinophils Absolute: 0.1 10*3/uL (ref 0.0–0.5)
Eosinophils Relative: 3 %
HEMATOCRIT: 24.7 % — AB (ref 38.4–49.9)
Hemoglobin: 8.3 g/dL — ABNORMAL LOW (ref 13.0–17.1)
LYMPHS PCT: 23 %
Lymphs Abs: 1.2 10*3/uL (ref 0.9–3.3)
MCH: 31.2 pg (ref 27.2–33.4)
MCHC: 33.8 g/dL (ref 32.0–36.0)
MCV: 92.5 fL (ref 79.3–98.0)
MONO ABS: 0.8 10*3/uL (ref 0.1–0.9)
Monocytes Relative: 15 %
NEUTROS ABS: 3 10*3/uL (ref 1.5–6.5)
Neutrophils Relative %: 57 %
Platelet Count: 157 10*3/uL (ref 140–400)
RBC: 2.67 MIL/uL — AB (ref 4.20–5.82)
RDW: 21.4 % — AB (ref 11.0–14.6)
WBC Count: 5.3 10*3/uL (ref 4.0–10.3)

## 2018-02-15 LAB — CMP (CANCER CENTER ONLY)
ALT: 10 U/L (ref 0–55)
AST: 18 U/L (ref 5–34)
Albumin: 3.3 g/dL — ABNORMAL LOW (ref 3.5–5.0)
Alkaline Phosphatase: 75 U/L (ref 40–150)
Anion gap: 12 — ABNORMAL HIGH (ref 3–11)
BILIRUBIN TOTAL: 0.3 mg/dL (ref 0.2–1.2)
BUN: 26 mg/dL (ref 7–26)
CALCIUM: 9.6 mg/dL (ref 8.4–10.4)
CO2: 20 mmol/L — ABNORMAL LOW (ref 22–29)
CREATININE: 1.66 mg/dL — AB (ref 0.70–1.30)
Chloride: 104 mmol/L (ref 98–109)
GFR, EST NON AFRICAN AMERICAN: 37 mL/min — AB (ref >60)
GFR, Est AFR Am: 43 mL/min — ABNORMAL LOW (ref >60)
Glucose, Bld: 125 mg/dL (ref 70–140)
Potassium: 3.7 mmol/L (ref 3.5–5.1)
Sodium: 136 mmol/L (ref 136–145)
Total Protein: 6.9 g/dL (ref 6.4–8.3)

## 2018-02-15 LAB — BASIC METABOLIC PANEL
BUN: 27 mg/dL — ABNORMAL HIGH (ref 6–23)
CO2: 25 mEq/L (ref 19–32)
Calcium: 9.7 mg/dL (ref 8.4–10.5)
Chloride: 101 mEq/L (ref 96–112)
Creatinine, Ser: 1.53 mg/dL — ABNORMAL HIGH (ref 0.40–1.50)
GFR: 46.68 mL/min — AB (ref >60.00)
GLUCOSE: 112 mg/dL — AB (ref 70–99)
POTASSIUM: 4.5 meq/L (ref 3.5–5.1)
SODIUM: 134 meq/L — AB (ref 135–145)

## 2018-02-15 LAB — SAMPLE TO BLOOD BANK

## 2018-02-15 MED ORDER — SODIUM CHLORIDE 0.9% FLUSH
10.0000 mL | INTRAVENOUS | Status: DC | PRN
  Administered 2018-02-15: 10 mL via INTRAVENOUS
  Filled 2018-02-15: qty 10

## 2018-02-15 MED ORDER — HEPARIN SOD (PORK) LOCK FLUSH 100 UNIT/ML IV SOLN
500.0000 [IU] | Freq: Once | INTRAVENOUS | Status: AC
  Administered 2018-02-15: 500 [IU] via INTRAVENOUS
  Filled 2018-02-15: qty 5

## 2018-02-15 NOTE — Telephone Encounter (Signed)
Patient scheduled and given contrast material for upcoming CT.  Phone number for radiology scheduling was also given.  AVS/Calendar printed per 2/19 LOS

## 2018-02-15 NOTE — Progress Notes (Signed)
Hematology and Oncology Follow Up Visit  Charles Coffey [id number] 01/02/1935 83 y.o. 02/15/2018 10:03 AM [name], MDFry, [name], MD   Principle Diagnosis: 83 year old man with advanced urothelial carcinoma involving the upper left genitourinary tract and bladder diagnosed in November 2018.  His tumor is protruding from the bladder into the rectum in addition to retroperitoneal adenopathy.  PDL 1 testing is positive with 25% CPS     Prior Therapy:  He is status post cystoscopy and transurethral resection of a bladder tumor and on 11/25/2017.  He is status post bilateral nephrostomy tube placement in November 2018.  He is status post diverting colostomy done on 12/01/2017 performed by Dr. Barry Dienes due to colonic obstruction.  Current therapy: Chemotherapy utilizing gemcitabine and carboplatin.  Day 1 of cycle 1 was given on December 30, 2017.  He completed 2 cycles of therapy.  Therapy currently discontinued based on his wishes.  Interim History: Charles Coffey presents today for a follow-up visit with his wife.  Since last visit, he was hospitalized between January 31, 2018 and February 05, 2018.  He was found to have a urosepsis and pancytopenia after chemotherapy.  He presented with a temperature of 102 with confusion and found to have UTI and was treated with ciprofloxacin.  He also had a left nephrostomy tube exchange on February 04, 2018.  Since his discharge, he has been feeling much better with significant improvement in his overall health.  He is able to ambulate without any difficulties and had not had any falls or syncope.  He continues to take OxyContin twice a day with Percocet for breakthrough.  There are days he does not require any pain breakthrough at the time.  Although he continues to have pain around the right side of the his nephrostomy tube.  He denies any fevers or confusion at the this time.  He denies any bleeding including hemoptysis or hematuria.   He does not report  any headaches, blurry vision, syncope or seizures.  No new neurological deficits.  He does not report any fevers, chills, sweats. He does not report any palpitation, orthopnea. He does not report any cough, wheezing or hemoptysis. He does not report any nausea, vomiting or abdominal pain. He does not report any frequency urgency or hesitancy. He does not report any skeletal complaints rales or myalgias.  Mood appears stable without anxiety or depression.  He does not report any skin rashes or lesions.  He denied any petechiae. He does not report any lymphadenopathy.  He does not report any easy bruisability.  Remaining review of systems is negative.  Medications: I have reviewed the patient's medication and discussed with the patient today.  Eliminated unnecessary medication today. Current Outpatient Medications  Medication Sig Dispense Refill  . acetaminophen (TYLENOL) 500 MG tablet Take 1,000 mg by mouth every 8 (eight) hours as needed for mild pain or moderate pain.    . carvedilol (COREG) 6.25 MG tablet Take 1 tablet (6.25 mg total) 2 (two) times daily with a meal by mouth. 180 tablet 3  . ciprofloxacin (CIPRO) 500 MG tablet Take 1 tablet (500 mg total) by mouth 2 (two) times daily for 10 days. 20 tablet 0  . diphenhydramine-acetaminophen (TYLENOL PM EXTRA STRENGTH) 25-500 MG TABS tablet Take 1 tablet by mouth at bedtime as needed (pain,sleep).     . feeding supplement, ENSURE ENLIVE, (ENSURE ENLIVE) LIQD Take 237 mLs by mouth 2 (two) times daily between meals. 60 Bottle 0  . furosemide (LASIX)  40 MG tablet Take 40 mg by mouth daily as needed for edema or shortness of breath.  0  . hydrALAZINE (APRESOLINE) 25 MG tablet Take 1 tablet (25 mg total) by mouth daily. (Patient not taking: Reported on 02/08/2018) 90 tablet 1  . Hydrocortisone Acetate (HEMORRHOIDAL-HC RE) Place 1 application rectally as needed (hemorrhoids).    . lidocaine-prilocaine (EMLA) cream Apply 1 application topically as needed. 30 g  0  . linaclotide (LINZESS) 290 MCG CAPS capsule TAKE 1 CAPSULE (290 MCG TOTAL) BY MOUTH DAILY. 30 MINUTES PRIOR TO A MEAL 30 capsule 10  . mirabegron ER (MYRBETRIQ) 25 MG TB24 tablet Take 1 tablet (25 mg total) by mouth daily. 30 tablet 0  . Multiple Vitamin (MULTIVITAMIN WITH MINERALS) TABS tablet Take 1 tablet by mouth daily. 30 tablet 1  . nitroGLYCERIN (NITROSTAT) 0.4 MG SL tablet Place 1 tablet (0.4 mg total) under the tongue every 5 (five) minutes as needed. 25 tablet 3  . oxyCODONE (OXYCONTIN) 10 mg 12 hr tablet Take 1 tablet (10 mg total) by mouth 2 (two) times daily. 60 tablet 0  . oxyCODONE-acetaminophen (PERCOCET) 10-325 MG tablet Take 1 tablet by mouth every 4 (four) hours as needed for pain. 60 tablet 0  . pramipexole (MIRAPEX) 1 MG tablet Take 1 mg by mouth daily.    . prochlorperazine (COMPAZINE) 10 MG tablet Take 1 tablet (10 mg total) by mouth every 6 (six) hours as needed for nausea or vomiting. 30 tablet 0  . ranitidine (ZANTAC) 300 MG tablet TAKE 1 TABLET BY MOUTH TWICE A DAY 60 tablet 3  . Tetrahydroz-Glyc-Hyprom-PEG (VISINE MAXIMUM REDNESS RELIEF OP) Place 2 drops into both eyes 2 (two) times daily as needed (dry eyes).     . traZODone (DESYREL) 50 MG tablet Take 1 tablet (50 mg total) by mouth at bedtime as needed for sleep. 30 tablet 2   No current facility-administered medications for this visit.      Allergies:  Allergies  Allergen Reactions  . Antihistamines, Loratadine-Type Other (See Comments)    Unable to urinate  . Statins Other (See Comments)    liver effects    Past Medical History, Surgical history, Social history, and Family History reviewed today and remain unchanged.   Physical Exam: Blood pressure (!) 109/59, pulse 72, temperature 97.7 F (36.5 C), temperature source Oral, resp. rate 18, height 5\' 8"  (1.727 m), weight 179 lb 4.8 oz (81.3 kg), SpO2 100 %. ECOG: 1 General appearance: Comfortable appearing gentleman without distress today. Head:  Atraumatic without any abnormalities. Oral mucosa: Mucous membranes are moist and pink without ulcers or lesions.  Eyes: Sclera anicteric. Lymph nodes: Cervical, supraclavicular, and axillary nodes normal. Heart: Regular rate and rhythm, S1 and S2 without murmurs or gallops. Lung: Clear to auscultation in all lung fields.  No wheezes or dullness to percussion.  Abdomin: Soft, nontender without any rebound or guarding.  Good bowel sounds. Skeletal: No joint deformity or effusions.  Full range of motion noted. Skin: No petechia or ecchymosis. Psychiatric: Mood and affect appeared normal and appropriate. Neurological: No motor or sensory deficits.  Lab Results: Lab Results  Component Value Date   WBC 5.3 02/15/2018   HGB 8.0 (L) 02/05/2018   HCT 24.7 (L) 02/15/2018   MCV 92.5 02/15/2018   PLT 157 02/15/2018     Chemistry      Component Value Date/Time   NA 133 (L) 02/05/2018 0339   NA 133 (L) 12/29/2017 1014   K 5.1 02/05/2018 1610  K 4.4 12/29/2017 1014   CL 101 02/05/2018 0339   CO2 24 02/05/2018 0339   CO2 20 (L) 12/29/2017 1014   BUN 27 (H) 02/05/2018 0339   BUN 31.5 (H) 12/29/2017 1014   CREATININE 1.32 (H) 02/05/2018 0339   CREATININE 1.42 (H) 01/27/2018 0811   CREATININE 1.6 (H) 12/29/2017 1014      Component Value Date/Time   CALCIUM 9.1 02/05/2018 0339   CALCIUM 9.6 12/29/2017 1014   ALKPHOS 91 01/31/2018 0153   ALKPHOS 61 12/29/2017 1014   AST 24 01/31/2018 0153   AST 20 01/27/2018 0811   AST 15 12/29/2017 1014   ALT 16 (L) 01/31/2018 0153   ALT 10 01/27/2018 0811   ALT 13 12/29/2017 1014   BILITOT 0.7 01/31/2018 0153   BILITOT 0.4 01/27/2018 0811   BILITOT 0.43 12/29/2017 1014       Impression and Plan:   83 year old gentleman with the following  1.  Stage IV urothelial carcinoma arising from the upper left genitourinary tract with locally advanced disease infiltrating the bladder into the rectum as well as retroperitoneal adenopathy.  His tumor is  PDL 1 positive with 25% CPS.  He is status post 2 cycles of palliative chemotherapy with gemcitabine and carboplatin.  Chemotherapy has been poorly tolerated and required hospitalization in February 2019.  The natural course of this disease was reviewed again today with the patient as well as treatment options were discussed.  Resuming palliative chemotherapy with gemcitabine and carboplatin, immunotherapy versus supportive care.  He understands without any systemic therapy he disease will likely progress and eventually affect his quality of life and at the same time, he had a multiple side effects with anticancer treatment and he is hesitant to have any further treatments.  After discussion today, we have elected to give him a treatment break for the next few weeks and repeat imaging studies for staging purposes.  At that time we will determine whether to proceed with anticancer treatment specifically immunotherapy which would be a reasonable option for him for better tolerance and likely reasonable response given his tumor is PDL 1+.   2.  Bilateral hydronephrosis: He has bilateral nephrostomy tube remained in place.  Continues to be managed by interventional radiology and urology.  Adequate drainage is noted.  3.  Thrombocytopenia: Related to malignancy and chemotherapy.  His platelet count today is back to normal and does not require any intervention.  4.  IV access: Port-A-Cath will be flushed periodically when not in use.  5.  Pain: Related to his cancer and nephrostomy tubes.  He is currently on OxyContin 10 mg twice a day with pain is manageable.  We have discussed breakthrough regimen options including Percocet and Tylenol.  He will alternate those and limit his Tylenol use.  6.  Congestive heart failure: He is followed by cardiology and currently on Lasix.  He is also on hydralazine and nitrate for maintenance.  7.  Deep vein thrombosis: He has limited lower extremity deep vein  thrombosis and off anticoagulation because of risk of bleeding.  8.  Anemia: Related to chemotherapy and malignancy.  His hemoglobin is adequate today and does not require transfusion.  9.  Prognosis and goals of care: This was discussed today in detail with the patient and his wife.  He understands he has an incurable malignancy and any treatment at this time is palliative.  He would like to keep a good quality of life and not opposed to restarting treatment if his  quality of life starts to suffer related to cancer.  For the time being we will hold off on any anticancer treatment and intervene with palliative modalities as needed.  10.  Follow-up: Will be in March 2019 for a follow-up scan.  25 minutes was spent today with the patient face-to-face.  More than 50% was spent today on education, counseling as well as coordinating his multifaceted care including treatment options and goals of care.  Zola Button, MD 2/19/201910:03 AM

## 2018-02-16 ENCOUNTER — Encounter: Payer: Self-pay | Admitting: Oncology

## 2018-02-16 ENCOUNTER — Other Ambulatory Visit: Payer: Self-pay

## 2018-02-16 ENCOUNTER — Other Ambulatory Visit: Payer: Self-pay | Admitting: *Deleted

## 2018-02-16 ENCOUNTER — Emergency Department (HOSPITAL_BASED_OUTPATIENT_CLINIC_OR_DEPARTMENT_OTHER): Payer: MEDICARE

## 2018-02-16 ENCOUNTER — Inpatient Hospital Stay (HOSPITAL_BASED_OUTPATIENT_CLINIC_OR_DEPARTMENT_OTHER)
Admission: EM | Admit: 2018-02-16 | Discharge: 2018-02-17 | DRG: 699 | Disposition: A | Discharge status: Home / Self Care | Payer: MEDICARE | Attending: Internal Medicine | Admitting: Internal Medicine

## 2018-02-16 ENCOUNTER — Encounter (HOSPITAL_BASED_OUTPATIENT_CLINIC_OR_DEPARTMENT_OTHER): Payer: Self-pay

## 2018-02-16 DIAGNOSIS — G2581 Restless legs syndrome: Secondary | ICD-10-CM | POA: Diagnosis present

## 2018-02-16 DIAGNOSIS — I700 Atherosclerosis of aorta: Secondary | ICD-10-CM | POA: Diagnosis present

## 2018-02-16 DIAGNOSIS — M6080 Other myositis, unspecified site: Secondary | ICD-10-CM | POA: Diagnosis not present

## 2018-02-16 DIAGNOSIS — N183 Chronic kidney disease, stage 3 unspecified: Secondary | ICD-10-CM | POA: Diagnosis present

## 2018-02-16 DIAGNOSIS — M545 Low back pain: Secondary | ICD-10-CM | POA: Diagnosis present

## 2018-02-16 DIAGNOSIS — T83511A Infection and inflammatory reaction due to indwelling urethral catheter, initial encounter: Secondary | ICD-10-CM

## 2018-02-16 DIAGNOSIS — N99521 Infection of other external stoma of urinary tract: Principal | ICD-10-CM | POA: Diagnosis present

## 2018-02-16 DIAGNOSIS — I255 Ischemic cardiomyopathy: Secondary | ICD-10-CM | POA: Diagnosis present

## 2018-02-16 DIAGNOSIS — K449 Diaphragmatic hernia without obstruction or gangrene: Secondary | ICD-10-CM | POA: Diagnosis not present

## 2018-02-16 DIAGNOSIS — I130 Hypertensive heart and chronic kidney disease with heart failure and stage 1 through stage 4 chronic kidney disease, or unspecified chronic kidney disease: Secondary | ICD-10-CM | POA: Diagnosis not present

## 2018-02-16 DIAGNOSIS — N1330 Unspecified hydronephrosis: Secondary | ICD-10-CM

## 2018-02-16 DIAGNOSIS — K219 Gastro-esophageal reflux disease without esophagitis: Secondary | ICD-10-CM | POA: Diagnosis not present

## 2018-02-16 DIAGNOSIS — G8929 Other chronic pain: Secondary | ICD-10-CM | POA: Diagnosis present

## 2018-02-16 DIAGNOSIS — M609 Myositis, unspecified: Secondary | ICD-10-CM | POA: Diagnosis not present

## 2018-02-16 DIAGNOSIS — I10 Essential (primary) hypertension: Secondary | ICD-10-CM | POA: Diagnosis present

## 2018-02-16 DIAGNOSIS — E785 Hyperlipidemia, unspecified: Secondary | ICD-10-CM | POA: Diagnosis not present

## 2018-02-16 DIAGNOSIS — Z79899 Other long term (current) drug therapy: Secondary | ICD-10-CM | POA: Diagnosis not present

## 2018-02-16 DIAGNOSIS — N390 Urinary tract infection, site not specified: Secondary | ICD-10-CM | POA: Diagnosis not present

## 2018-02-16 DIAGNOSIS — Y838 Other surgical procedures as the cause of abnormal reaction of the patient, or of later complication, without mention of misadventure at the time of the procedure: Secondary | ICD-10-CM | POA: Diagnosis not present

## 2018-02-16 DIAGNOSIS — Z8249 Family history of ischemic heart disease and other diseases of the circulatory system: Secondary | ICD-10-CM

## 2018-02-16 DIAGNOSIS — C679 Malignant neoplasm of bladder, unspecified: Secondary | ICD-10-CM

## 2018-02-16 DIAGNOSIS — T83512A Infection and inflammatory reaction due to nephrostomy catheter, initial encounter: Secondary | ICD-10-CM | POA: Diagnosis not present

## 2018-02-16 DIAGNOSIS — Y733 Surgical instruments, materials and gastroenterology and urology devices (including sutures) associated with adverse incidents: Secondary | ICD-10-CM | POA: Diagnosis present

## 2018-02-16 DIAGNOSIS — I4510 Unspecified right bundle-branch block: Secondary | ICD-10-CM | POA: Diagnosis present

## 2018-02-16 DIAGNOSIS — Z86718 Personal history of other venous thrombosis and embolism: Secondary | ICD-10-CM

## 2018-02-16 DIAGNOSIS — N136 Pyonephrosis: Secondary | ICD-10-CM | POA: Diagnosis present

## 2018-02-16 DIAGNOSIS — I739 Peripheral vascular disease, unspecified: Secondary | ICD-10-CM | POA: Diagnosis present

## 2018-02-16 DIAGNOSIS — N400 Enlarged prostate without lower urinary tract symptoms: Secondary | ICD-10-CM | POA: Diagnosis present

## 2018-02-16 DIAGNOSIS — R109 Unspecified abdominal pain: Secondary | ICD-10-CM | POA: Diagnosis not present

## 2018-02-16 DIAGNOSIS — Z981 Arthrodesis status: Secondary | ICD-10-CM

## 2018-02-16 DIAGNOSIS — Z888 Allergy status to other drugs, medicaments and biological substances status: Secondary | ICD-10-CM

## 2018-02-16 DIAGNOSIS — I252 Old myocardial infarction: Secondary | ICD-10-CM | POA: Diagnosis not present

## 2018-02-16 DIAGNOSIS — I2510 Atherosclerotic heart disease of native coronary artery without angina pectoris: Secondary | ICD-10-CM | POA: Diagnosis present

## 2018-02-16 DIAGNOSIS — N99528 Other complication of other external stoma of urinary tract: Secondary | ICD-10-CM | POA: Diagnosis present

## 2018-02-16 DIAGNOSIS — K589 Irritable bowel syndrome without diarrhea: Secondary | ICD-10-CM | POA: Diagnosis present

## 2018-02-16 DIAGNOSIS — Z933 Colostomy status: Secondary | ICD-10-CM

## 2018-02-16 DIAGNOSIS — Z87891 Personal history of nicotine dependence: Secondary | ICD-10-CM

## 2018-02-16 DIAGNOSIS — I5022 Chronic systolic (congestive) heart failure: Secondary | ICD-10-CM | POA: Diagnosis not present

## 2018-02-16 LAB — URINALYSIS, MICROSCOPIC (REFLEX)

## 2018-02-16 LAB — CBC WITH DIFFERENTIAL/PLATELET
BASOS PCT: 3 %
Basophils Absolute: 0.2 10*3/uL — ABNORMAL HIGH (ref 0.0–0.1)
EOS ABS: 0.1 10*3/uL (ref 0.0–0.7)
Eosinophils Relative: 2 %
HEMATOCRIT: 25.4 % — AB (ref 39.0–52.0)
HEMOGLOBIN: 8.3 g/dL — AB (ref 13.0–17.0)
Lymphocytes Relative: 20 %
Lymphs Abs: 1.5 10*3/uL (ref 0.7–4.0)
MCH: 31.4 pg (ref 26.0–34.0)
MCHC: 32.7 g/dL (ref 30.0–36.0)
MCV: 96.2 fL (ref 78.0–100.0)
Monocytes Absolute: 1.1 10*3/uL — ABNORMAL HIGH (ref 0.1–1.0)
Monocytes Relative: 14 %
NEUTROS ABS: 4.5 10*3/uL (ref 1.7–7.7)
NEUTROS PCT: 61 %
Platelets: 158 10*3/uL (ref 150–400)
RBC: 2.64 MIL/uL — AB (ref 4.22–5.81)
RDW: 19.8 % — ABNORMAL HIGH (ref 11.5–15.5)
WBC: 7.4 10*3/uL (ref 4.0–10.5)

## 2018-02-16 LAB — URINALYSIS, ROUTINE W REFLEX MICROSCOPIC
Bilirubin Urine: NEGATIVE
Glucose, UA: NEGATIVE mg/dL
Ketones, ur: NEGATIVE mg/dL
LEUKOCYTES UA: NEGATIVE
NITRITE: NEGATIVE
PH: 6 (ref 5.0–8.0)
Protein, ur: >300 mg/dL — AB
Specific Gravity, Urine: >1.03 — ABNORMAL HIGH (ref 1.005–1.030)

## 2018-02-16 MED ORDER — OXYCODONE-ACETAMINOPHEN 10-325 MG PO TABS: 1.0000 | ORAL_TABLET | ORAL | 0 refills | Status: DC | PRN

## 2018-02-16 MED ORDER — OXYCODONE HCL ER 10 MG PO T12A: 10.0000 mg | EXTENDED_RELEASE_TABLET | Freq: Two times a day (BID) | ORAL | 0 refills | Status: DC

## 2018-02-16 NOTE — ED Provider Notes (Signed)
[facility] Provider Coffey   CSN: [id number] Arrival date & time: 02/16/18  2238     History   Chief Complaint Chief Complaint  Patient presents with  . Flank Pain    HPI [name] is a 83 y.o. male.  The history is provided by the patient.  Flank Pain  This is a new problem. The current episode started yesterday. The problem occurs constantly. The problem has not changed since onset.Pertinent negatives include Charles chest pain, Charles abdominal pain, Charles headaches and Charles shortness of breath. Nothing aggravates the symptoms. Nothing relieves the symptoms. He has tried nothing for the symptoms. The treatment provided Charles relief.  Purulent drainage around his nephro-tube insertion into the skin.  Charles f/c/r.    Past Medical History:  Diagnosis Date  . Aortic atherosclerosis ([place])   . BPH with urinary obstruction   . CAD (coronary artery disease)    a.  MI 1995, CABG x 3 2002 (patient says that he had LIMA and RIMA grafts). b. ETT-Cardiolite (10/15) with EF 48%, apical scar, Charles ischemia. c. Nuc 07/2017 abnormal -> cath was performed,  patent LIMA to LAD and SVG to OM 2. RIMA to RCA is atretic. The right coronary artery is occluded distally with extensive collaterals from the LAD, medical therapy.   . Cancer [facility])    bladder cancer  . Cervical spondylosis without myelopathy 10-29-202017  . Chronic low back pain    sees Dr. Kary Kos   . GERD (gastroesophageal reflux disease)   . Hiatal hernia   . Hyperlipidemia   . Hypertension   . IBS (irritable bowel syndrome)   . Ischemic cardiomyopathy   . Myocardial infarction ([place])   . RBBB   . Restless legs syndrome   . Statin intolerance   . Syncope    a. in 2015 - Charles apparent cause, was taking sleep medicine at the time. Cardiac workup unremarkable.  . Tubular adenoma of colon     Patient Active Problem List   Diagnosis Date Noted  . Chronic systolic CHF (congestive heart failure) ([place]) 02/07/2018  .  Malnutrition of moderate degree 02/01/2018  . Acute respiratory failure with hypoxia ([place]) 01/31/2018  . Acute CHF (congestive heart failure) ([place]) 01/31/2018  . Thrombocytopathia ([place])   . DVT (deep venous thrombosis) ([place]) 01/11/2018  . Acute lower UTI 01/01/2018  . Sepsis secondary to UTI ([place]) 01/01/2018  . Colostomy in place [facility]) 12/04/2017  . Rectal stenosis   . Low vitamin B12 level 11/30/2017  . Irritable bowel syndrome with constipation   . Pyelonephritis 11/28/2017  . Fever 11/28/2017  . Ileus ([place]) 11/28/2017  . Elevated troponin 11/28/2017  . Urothelial carcinoma of bladder ([place]) 11/28/2017  . Dehydration 11/28/2017  . Thrombocytopenia ([place]) 11/28/2017  . Hydronephrosis due to obstructive malignant neoplasm of bladder ([place]) 11/25/2017  . Anemia 09/20/2017  . Stage 3 chronic kidney disease ([place]) 09/20/2017  . Angina pectoris ([place])   . Abnormal stress test   . Cervical spondylosis without myelopathy 010-29-202017  . PAD (peripheral artery disease) ([place]) 03/04/2015  . Arteriosclerosis of coronary artery 11/01/2014  . Acid reflux 11/01/2014  . H/O male genital system disorder 11/01/2014  . Restless leg 11/01/2014  . Acne erythematosa 11/01/2014  . Syncope 09/24/2014  . Chronic low back pain 11/10/2013  . Cardiomyopathy, ischemic 11/29/2012  . Hyperlipidemia LDL goal <70 10/15/2010  . RESTLESS LEGS SYNDROME 10/15/2010  . Essential hypertension 10/15/2010  . MYOCARDIAL INFARCTION, HX OF 10/15/2010  .  Coronary artery disease of native heart with stable angina pectoris ([place]) 10/15/2010  . GERD 10/15/2010  . BENIGN PROSTATIC HYPERTROPHY 10/15/2010  . BENIGN PROSTATIC HYPERTROPHY, WITH OBSTRUCTION 10/15/2010  . COLONIC POLYPS, HX OF 10/15/2010    Past Surgical History:  Procedure Laterality Date  . COLONOSCOPY  10/17/2014   per Dr. Hilarie Fredrickson, tubular adenomas, repeat in 3 yrs   . COLONOSCOPY WITH PROPOFOL N/A 12/01/2017   Procedure: COLONOSCOPY WITH PROPOFOL;  Surgeon:  Milus Banister, MD;  Location: [facility];  Service: Endoscopy;  Laterality: N/A;  . CYSTOSCOPY W/ RETROGRADES Left 11/25/2017   Procedure: CYSTOSCOPY WITH RETROGRADE PYELOGRAM;  Surgeon: Irine Seal, MD;  Location: [facility] ORS;  Service: Urology;  Laterality: Left;  . HEART BYPASS    . IR CONVERT RIGHT NEPHROSTOMY TO NEPHROURETERAL CATH  02/04/2018  . IR FLUORO GUIDE CV LINE RIGHT  12/27/2017  . IR NEPHROSTOGRAM LEFT THRU EXISTING ACCESS  12/24/2017  . IR NEPHROSTOMY EXCHANGE LEFT  01/28/2018  . IR NEPHROSTOMY EXCHANGE LEFT  02/04/2018  . IR NEPHROSTOMY EXCHANGE RIGHT  12/24/2017  . IR NEPHROSTOMY EXCHANGE RIGHT  01/28/2018  . IR NEPHROSTOMY PLACEMENT LEFT  11/28/2017  . IR NEPHROSTOMY PLACEMENT RIGHT  12/03/2017  . IR US GUIDE VASC ACCESS RIGHT  12/27/2017  . LAPAROSCOPY N/A 12/01/2017   Procedure: LAPAROSCOPIC DIVERTING OSTOMY;  Surgeon: Stark Klein, MD;  Location: [facility];  Service: General;  Laterality: N/A;  . LEFT HEART CATH AND CORS/GRAFTS ANGIOGRAPHY N/A 08/25/2017   Procedure: LEFT HEART CATH AND CORS/GRAFTS ANGIOGRAPHY;  Surgeon: Wellington Hampshire, MD;  Location: [facility];  Service: Cardiovascular;  Laterality: N/A;  . LUMBAR FUSION  2003   L3-L4  . PROSTATE SURGERY  06-27-12   per Dr. Roni Bread, had CTT  . TONSILLECTOMY    . TRANSURETHRAL RESECTION OF BLADDER TUMOR N/A 11/25/2017   Procedure: TRANSURETHRAL RESECTION OF BLADDER TUMOR (TURBT);  Surgeon: Irine Seal, MD;  Location: [facility];  Service: Urology;  Laterality: N/A;       Home Medications    Prior to Admission medications   Medication Sig Start Date End Date Taking? Authorizing Provider  acetaminophen (TYLENOL) 500 MG tablet Take 1,000 mg by mouth every 8 (eight) hours as needed for mild pain or moderate pain.    [provider]  carvedilol (COREG) 6.25 MG tablet Take 1 tablet (6.25 mg total) 2 (two) times daily with a meal by mouth. 11/09/17   Larey Dresser, MD  ciprofloxacin (CIPRO) 500 MG tablet Take 1  tablet (500 mg total) by mouth 2 (two) times daily for 10 days. 02/07/18 02/17/18  Janece Canterbury, MD  diphenhydramine-acetaminophen (TYLENOL PM EXTRA STRENGTH) 25-500 MG TABS tablet Take 1 tablet by mouth at bedtime as needed (pain,sleep).     [provider]  feeding supplement, ENSURE ENLIVE, (ENSURE ENLIVE) LIQD Take 237 mLs by mouth 2 (two) times daily between meals. 02/03/18   Janece Canterbury, MD  furosemide (LASIX) 40 MG tablet Take 40 mg by mouth daily as needed for edema or shortness of breath. 02/07/18   [provider]  hydrALAZINE (APRESOLINE) 25 MG tablet Take 1 tablet (25 mg total) by mouth daily. Patient not taking: Reported on 02/08/2018 02/07/18 05/08/18  Imogene Burn, PA-C  Hydrocortisone Acetate [facility] RE) Place 1 application rectally as needed (hemorrhoids).    [provider]  lidocaine-prilocaine (EMLA) cream Apply 1 application topically as needed. 12/15/17   Wyatt Portela, MD  linaclotide Rolan Lipa) 290 MCG CAPS capsule  TAKE 1 CAPSULE (290 MCG TOTAL) BY MOUTH DAILY. [place] A MEAL 03/30/17   Pyrtle, Lajuan Lines, MD  mirabegron ER (MYRBETRIQ) 25 MG TB24 tablet Take 1 tablet (25 mg total) by mouth daily. 02/04/18   Janece Canterbury, MD  Multiple Vitamin (MULTIVITAMIN WITH MINERALS) TABS tablet Take 1 tablet by mouth daily. 01/05/18   Roxan Hockey, MD  nitroGLYCERIN (NITROSTAT) 0.4 MG SL tablet Place 1 tablet (0.4 mg total) under the tongue every 5 (five) minutes as needed. 08/19/17 01/01/18  Charlie Pitter, PA-C  oxyCODONE (OXYCONTIN) 10 mg 12 hr tablet Take 1 tablet (10 mg total) by mouth 2 (two) times daily. 02/16/18   Wyatt Portela, MD  oxyCODONE-acetaminophen (PERCOCET) 10-325 MG tablet Take 1 tablet by mouth every 4 (four) hours as needed for pain. 02/16/18   Wyatt Portela, MD  pramipexole (MIRAPEX) 1 MG tablet Take 1 mg by mouth daily.    [provider]  prochlorperazine (COMPAZINE) 10 MG tablet Take 1 tablet (10 mg total) by  mouth every 6 (six) hours as needed for nausea or vomiting. 12/15/17   Wyatt Portela, MD  ranitidine (ZANTAC) 300 MG tablet TAKE 1 TABLET BY MOUTH TWICE A DAY 02/10/18   Pyrtle, Lajuan Lines, MD  Tetrahydroz-Glyc-Hyprom-PEG (VISINE MAXIMUM REDNESS RELIEF OP) Place 2 drops into both eyes 2 (two) times daily as needed (dry eyes).     [provider]  traZODone (DESYREL) 50 MG tablet Take 1 tablet (50 mg total) by mouth at bedtime as needed for sleep. 01/14/18   [name], MD    Family History Family History  Problem Relation Age of Onset  . Heart attack Mother   . Aneurysm Father        femoral artery  . Heart disease Brother   . Heart disease Maternal Uncle        x 2  . Colon cancer Neg Hx   . Esophageal cancer Neg Hx   . Pancreatic cancer Neg Hx   . Kidney disease Neg Hx   . Liver disease Neg Hx     Social History Social History   Tobacco Use  . Smoking status: Former Smoker    Types: Cigarettes    Last attempt to quit: 12/28/1978    Years since quitting: 39.1  . Smokeless tobacco: Never Used  Substance Use Topics  . Alcohol use: Yes    Alcohol/week: 1.2 oz    Types: 1 Glasses of wine, 1 Cans of beer per week    Comment: occassional  . Drug use: Charles     Allergies   Antihistamines, loratadine-type and Statins   Review of Systems Review of Systems  Constitutional: Negative for fever.  Respiratory: Negative for shortness of breath.   Cardiovascular: Negative for chest pain.  Gastrointestinal: Negative for abdominal pain.  Genitourinary: Positive for flank pain. Negative for dysuria.  Neurological: Negative for headaches.  All other systems reviewed and are negative.    Physical Exam Updated Vital Signs BP 109/66 (BP Location: Left Arm)   Pulse 96   Temp 97.9 F (36.6 C) (Oral)   Resp 18   Ht 5\' 8"  (1.727 m)   Wt 81.2 kg (179 lb)   SpO2 100%   BMI 27.22 kg/m   Physical Exam  Constitutional: He is oriented to person, place, and time. He appears  well-developed and well-nourished. Charles distress.  HENT:  Head: Normocephalic and atraumatic.  Mouth/Throat: Charles oropharyngeal exudate.  Eyes: Conjunctivae are  normal. Pupils are equal, round, and reactive to light.  Neck: Normal range of motion. Neck supple.  Cardiovascular: Normal rate, regular rhythm, normal heart sounds and intact distal pulses.  Pulmonary/Chest: Effort normal and breath sounds normal. Charles stridor. He has Charles wheezes. He has Charles rales.  Abdominal: Soft. Bowel sounds are normal. He exhibits Charles mass. There is Charles tenderness. There is Charles rebound and Charles guarding.  Musculoskeletal: Normal range of motion.  Scant purulent drainage at incision for right nephrostomy tube  Neurological: He is alert and oriented to person, place, and time. He displays normal reflexes.  Skin: Skin is warm and dry. Capillary refill takes less than 2 seconds.     ED Treatments / Results  Labs (all labs ordered are listed, but only abnormal results are displayed)  Results for orders placed or performed during the hospital encounter of 02/16/18  CBC with Differential/Platelet  Result Value Ref Range   WBC 7.4 4.0 - 10.5 K/uL   RBC 2.64 (L) 4.22 - 5.81 MIL/uL   Hemoglobin 8.3 (L) 13.0 - 17.0 g/dL   HCT 25.4 (L) 39.0 - 52.0 %   MCV 96.2 78.0 - 100.0 fL   MCH 31.4 26.0 - 34.0 pg   MCHC 32.7 30.0 - 36.0 g/dL   RDW 19.8 (H) 11.5 - 15.5 %   Platelets 158 150 - 400 K/uL   Neutrophils Relative % 61 %   Neutro Abs 4.5 1.7 - 7.7 K/uL   Lymphocytes Relative 20 %   Lymphs Abs 1.5 0.7 - 4.0 K/uL   Monocytes Relative 14 %   Monocytes Absolute 1.1 (H) 0.1 - 1.0 K/uL   Eosinophils Relative 2 %   Eosinophils Absolute 0.1 0.0 - 0.7 K/uL   Basophils Relative 3 %   Basophils Absolute 0.2 (H) 0.0 - 0.1 K/uL  Basic metabolic panel  Result Value Ref Range   Sodium 135 135 - 145 mmol/L   Potassium 3.8 3.5 - 5.1 mmol/L   Chloride 104 101 - 111 mmol/L   CO2 20 (L) 22 - 32 mmol/L   Glucose, Bld 109 (H) 65 - 99  mg/dL   BUN 31 (H) 6 - 20 mg/dL   Creatinine, Ser 1.66 (H) 0.61 - 1.24 mg/dL   Calcium 9.3 8.9 - 10.3 mg/dL   GFR calc non Af Amer 37 (L) >60 mL/min   GFR calc Af Amer 43 (L) >60 mL/min   Anion gap 11 5 - 15  Urinalysis, Routine w reflex microscopic  Result Value Ref Range   Color, Urine YELLOW YELLOW   APPearance CLOUDY (A) CLEAR   Specific Gravity, Urine >1.030 (H) 1.005 - 1.030   pH 6.0 5.0 - 8.0   Glucose, UA NEGATIVE NEGATIVE mg/dL   Hgb urine dipstick LARGE (A) NEGATIVE   Bilirubin Urine NEGATIVE NEGATIVE   Ketones, ur NEGATIVE NEGATIVE mg/dL   Protein, ur >300 (A) NEGATIVE mg/dL   Nitrite NEGATIVE NEGATIVE   Leukocytes, UA NEGATIVE NEGATIVE  Urinalysis, Microscopic (reflex)  Result Value Ref Range   RBC / HPF TOO NUMEROUS TO COUNT 0 - 5 RBC/hpf   WBC, UA 0-5 0 - 5 WBC/hpf   Bacteria, UA FEW (A) NONE SEEN   Squamous Epithelial / LPF 0-5 (A) NONE SEEN       Medications Ordered in ED Medications  acetaminophen (TYLENOL) tablet 650 mg (not administered)    Or  acetaminophen (TYLENOL) suppository 650 mg (not administered)  ondansetron (ZOFRAN) tablet 4 mg (not administered)  Or  ondansetron (ZOFRAN) injection 4 mg (not administered)  enoxaparin (LOVENOX) injection 40 mg (not administered)  vancomycin (VANCOCIN) 1,500 mg in sodium chloride 0.9 % 500 mL IVPB (not administered)  vancomycin (VANCOCIN) 1,250 mg in sodium chloride 0.9 % 250 mL IVPB (not administered)  piperacillin-tazobactam (ZOSYN) IVPB 3.375 g (not administered)  piperacillin-tazobactam (ZOSYN) IVPB 3.375 g (0 g Intravenous Stopped 02/17/18 0049)       Final Clinical Impressions(s) / ED Diagnoses  Given purulent drainage at nephro insertion site and myositis on CT will need readmission for IV antibiotics as oral cipro is not keeping up with the infection    Burnetta Kohls, MD 02/17/18 [id number]

## 2018-02-16 NOTE — ED Triage Notes (Signed)
Pt c/o pain, redness, pus right nephrostomy tube x 2 days-was replaced last Friday-NAD-steady gait

## 2018-02-16 NOTE — ED Notes (Signed)
Patient transported to CT 

## 2018-02-17 ENCOUNTER — Encounter (HOSPITAL_BASED_OUTPATIENT_CLINIC_OR_DEPARTMENT_OTHER): Payer: Self-pay | Admitting: Emergency Medicine

## 2018-02-17 DIAGNOSIS — N390 Urinary tract infection, site not specified: Secondary | ICD-10-CM

## 2018-02-17 DIAGNOSIS — I700 Atherosclerosis of aorta: Secondary | ICD-10-CM | POA: Diagnosis not present

## 2018-02-17 DIAGNOSIS — T83512A Infection and inflammatory reaction due to nephrostomy catheter, initial encounter: Secondary | ICD-10-CM | POA: Diagnosis not present

## 2018-02-17 DIAGNOSIS — C679 Malignant neoplasm of bladder, unspecified: Secondary | ICD-10-CM

## 2018-02-17 DIAGNOSIS — E785 Hyperlipidemia, unspecified: Secondary | ICD-10-CM | POA: Diagnosis not present

## 2018-02-17 DIAGNOSIS — N136 Pyonephrosis: Secondary | ICD-10-CM | POA: Diagnosis present

## 2018-02-17 DIAGNOSIS — Z79899 Other long term (current) drug therapy: Secondary | ICD-10-CM | POA: Diagnosis not present

## 2018-02-17 DIAGNOSIS — N1330 Unspecified hydronephrosis: Secondary | ICD-10-CM

## 2018-02-17 DIAGNOSIS — G8929 Other chronic pain: Secondary | ICD-10-CM | POA: Diagnosis not present

## 2018-02-17 DIAGNOSIS — K449 Diaphragmatic hernia without obstruction or gangrene: Secondary | ICD-10-CM | POA: Diagnosis not present

## 2018-02-17 DIAGNOSIS — I252 Old myocardial infarction: Secondary | ICD-10-CM | POA: Diagnosis not present

## 2018-02-17 DIAGNOSIS — K624 Stenosis of anus and rectum: Secondary | ICD-10-CM | POA: Diagnosis not present

## 2018-02-17 DIAGNOSIS — Y733 Surgical instruments, materials and gastroenterology and urology devices (including sutures) associated with adverse incidents: Secondary | ICD-10-CM | POA: Diagnosis not present

## 2018-02-17 DIAGNOSIS — K219 Gastro-esophageal reflux disease without esophagitis: Secondary | ICD-10-CM | POA: Diagnosis not present

## 2018-02-17 DIAGNOSIS — N183 Chronic kidney disease, stage 3 (moderate): Secondary | ICD-10-CM | POA: Diagnosis not present

## 2018-02-17 DIAGNOSIS — Z933 Colostomy status: Secondary | ICD-10-CM | POA: Diagnosis not present

## 2018-02-17 DIAGNOSIS — I2510 Atherosclerotic heart disease of native coronary artery without angina pectoris: Secondary | ICD-10-CM | POA: Diagnosis not present

## 2018-02-17 DIAGNOSIS — I5022 Chronic systolic (congestive) heart failure: Secondary | ICD-10-CM | POA: Diagnosis not present

## 2018-02-17 DIAGNOSIS — N99528 Other complication of other external stoma of urinary tract: Secondary | ICD-10-CM | POA: Diagnosis present

## 2018-02-17 DIAGNOSIS — I10 Essential (primary) hypertension: Secondary | ICD-10-CM

## 2018-02-17 DIAGNOSIS — Y838 Other surgical procedures as the cause of abnormal reaction of the patient, or of later complication, without mention of misadventure at the time of the procedure: Secondary | ICD-10-CM | POA: Diagnosis not present

## 2018-02-17 DIAGNOSIS — I130 Hypertensive heart and chronic kidney disease with heart failure and stage 1 through stage 4 chronic kidney disease, or unspecified chronic kidney disease: Secondary | ICD-10-CM | POA: Diagnosis not present

## 2018-02-17 DIAGNOSIS — M545 Low back pain: Secondary | ICD-10-CM | POA: Diagnosis not present

## 2018-02-17 DIAGNOSIS — M609 Myositis, unspecified: Secondary | ICD-10-CM | POA: Diagnosis not present

## 2018-02-17 DIAGNOSIS — G2581 Restless legs syndrome: Secondary | ICD-10-CM | POA: Diagnosis not present

## 2018-02-17 DIAGNOSIS — I255 Ischemic cardiomyopathy: Secondary | ICD-10-CM | POA: Diagnosis not present

## 2018-02-17 DIAGNOSIS — T83511A Infection and inflammatory reaction due to indwelling urethral catheter, initial encounter: Secondary | ICD-10-CM

## 2018-02-17 DIAGNOSIS — Z87891 Personal history of nicotine dependence: Secondary | ICD-10-CM | POA: Diagnosis not present

## 2018-02-17 DIAGNOSIS — N99521 Infection of other external stoma of urinary tract: Secondary | ICD-10-CM | POA: Diagnosis not present

## 2018-02-17 LAB — URINALYSIS, ROUTINE W REFLEX MICROSCOPIC
BILIRUBIN URINE: NEGATIVE
GLUCOSE, UA: NEGATIVE mg/dL
Ketones, ur: NEGATIVE mg/dL
NITRITE: NEGATIVE
PH: 5 (ref 5.0–8.0)
Protein, ur: 100 mg/dL — AB
SPECIFIC GRAVITY, URINE: 1.011 (ref 1.005–1.030)
SQUAMOUS EPITHELIAL / LPF: NONE SEEN

## 2018-02-17 LAB — BASIC METABOLIC PANEL
ANION GAP: 11 (ref 5–15)
BUN: 31 mg/dL — ABNORMAL HIGH (ref 6–20)
CALCIUM: 9.3 mg/dL (ref 8.9–10.3)
CO2: 20 mmol/L — ABNORMAL LOW (ref 22–32)
Chloride: 104 mmol/L (ref 101–111)
Creatinine, Ser: 1.66 mg/dL — ABNORMAL HIGH (ref 0.61–1.24)
GFR calc non Af Amer: 37 mL/min — ABNORMAL LOW (ref >60)
GFR, EST AFRICAN AMERICAN: 43 mL/min — AB (ref >60)
GLUCOSE: 109 mg/dL — AB (ref 65–99)
POTASSIUM: 3.8 mmol/L (ref 3.5–5.1)
Sodium: 135 mmol/L (ref 135–145)

## 2018-02-17 LAB — MRSA PCR SCREENING: MRSA by PCR: NEGATIVE

## 2018-02-17 MED ORDER — MIRABEGRON ER 25 MG PO TB24
25.0000 mg | ORAL_TABLET | Freq: Every day | ORAL | Status: DC
  Administered 2018-02-17: 25 mg via ORAL
  Filled 2018-02-17 (×2): qty 1

## 2018-02-17 MED ORDER — ENOXAPARIN SODIUM 40 MG/0.4ML ~~LOC~~ SOLN
40.0000 mg | SUBCUTANEOUS | Status: DC
  Filled 2018-02-17: qty 0.4

## 2018-02-17 MED ORDER — PIPERACILLIN-TAZOBACTAM 3.375 G IVPB
3.3750 g | Freq: Three times a day (TID) | INTRAVENOUS | Status: DC
  Administered 2018-02-17: 3.375 g via INTRAVENOUS
  Filled 2018-02-17 (×3): qty 50

## 2018-02-17 MED ORDER — ONDANSETRON HCL 4 MG PO TABS: 4.0000 mg | ORAL_TABLET | Freq: Four times a day (QID) | ORAL | Status: DC | PRN

## 2018-02-17 MED ORDER — ACETAMINOPHEN 650 MG RE SUPP: 650.0000 mg | Freq: Four times a day (QID) | RECTAL | Status: DC | PRN

## 2018-02-17 MED ORDER — PIPERACILLIN-TAZOBACTAM 3.375 G IVPB 30 MIN
3.3750 g | Freq: Once | INTRAVENOUS | Status: AC
  Administered 2018-02-17: 3.375 g via INTRAVENOUS
  Filled 2018-02-17 (×2): qty 50

## 2018-02-17 MED ORDER — FAMOTIDINE 20 MG PO TABS
40.0000 mg | ORAL_TABLET | Freq: Two times a day (BID) | ORAL | Status: DC
  Administered 2018-02-17: 40 mg via ORAL
  Filled 2018-02-17: qty 2

## 2018-02-17 MED ORDER — OXYCODONE-ACETAMINOPHEN 5-325 MG PO TABS
1.0000 | ORAL_TABLET | ORAL | Status: DC | PRN
  Administered 2018-02-17 (×2): 1 via ORAL
  Filled 2018-02-17 (×2): qty 1

## 2018-02-17 MED ORDER — OXYCODONE HCL 5 MG PO TABS
5.0000 mg | ORAL_TABLET | ORAL | Status: DC | PRN
  Administered 2018-02-17 (×2): 5 mg via ORAL
  Filled 2018-02-17 (×2): qty 1

## 2018-02-17 MED ORDER — SODIUM CHLORIDE 0.9 % IV SOLN: 1.0000 g | Freq: Once | INTRAVENOUS | Status: DC

## 2018-02-17 MED ORDER — VANCOMYCIN HCL 10 G IV SOLR
1250.0000 mg | INTRAVENOUS | Status: DC
  Filled 2018-02-17: qty 1250

## 2018-02-17 MED ORDER — ADULT MULTIVITAMIN W/MINERALS CH
1.0000 | ORAL_TABLET | Freq: Every day | ORAL | Status: DC
  Filled 2018-02-17: qty 1

## 2018-02-17 MED ORDER — NAPHAZOLINE-GLYCERIN 0.012-0.2 % OP SOLN: Freq: Two times a day (BID) | OPHTHALMIC | Status: DC | PRN

## 2018-02-17 MED ORDER — OXYCODONE HCL ER 10 MG PO T12A
10.0000 mg | EXTENDED_RELEASE_TABLET | Freq: Two times a day (BID) | ORAL | Status: DC
  Administered 2018-02-17: 10 mg via ORAL
  Filled 2018-02-17 (×2): qty 1

## 2018-02-17 MED ORDER — OXYCODONE-ACETAMINOPHEN 10-325 MG PO TABS: 1.0000 | ORAL_TABLET | ORAL | Status: DC | PRN

## 2018-02-17 MED ORDER — LINACLOTIDE 145 MCG PO CAPS
290.0000 ug | ORAL_CAPSULE | Freq: Every day | ORAL | Status: DC
  Administered 2018-02-17: 290 ug via ORAL
  Filled 2018-02-17: qty 1
  Filled 2018-02-17: qty 2

## 2018-02-17 MED ORDER — PRAMIPEXOLE DIHYDROCHLORIDE 1 MG PO TABS
1.0000 mg | ORAL_TABLET | Freq: Every day | ORAL | Status: DC
  Filled 2018-02-17: qty 1

## 2018-02-17 MED ORDER — ACETAMINOPHEN 325 MG PO TABS: 650.0000 mg | ORAL_TABLET | Freq: Four times a day (QID) | ORAL | Status: DC | PRN

## 2018-02-17 MED ORDER — CIPROFLOXACIN HCL 500 MG PO TABS: 500.0000 mg | ORAL_TABLET | Freq: Two times a day (BID) | ORAL | 0 refills | Status: AC

## 2018-02-17 MED ORDER — ONDANSETRON HCL 4 MG/2ML IJ SOLN: 4.0000 mg | Freq: Four times a day (QID) | INTRAMUSCULAR | Status: DC | PRN

## 2018-02-17 MED ORDER — VANCOMYCIN HCL 500 MG IV SOLR
INTRAVENOUS | Status: AC
  Filled 2018-02-17: qty 500

## 2018-02-17 MED ORDER — VANCOMYCIN HCL 10 G IV SOLR
1500.0000 mg | Freq: Once | INTRAVENOUS | Status: AC
  Administered 2018-02-17: 1500 mg via INTRAVENOUS
  Filled 2018-02-17: qty 1500

## 2018-02-17 MED ORDER — TRAZODONE HCL 50 MG PO TABS
50.0000 mg | ORAL_TABLET | Freq: Every evening | ORAL | Status: DC | PRN
  Filled 2018-02-17: qty 1

## 2018-02-17 MED ORDER — SODIUM CHLORIDE 0.9 % IV SOLN: 1250.0000 mg | INTRAVENOUS | Status: DC

## 2018-02-17 MED ORDER — VANCOMYCIN HCL IN DEXTROSE 1-5 GM/200ML-% IV SOLN
1000.0000 mg | Freq: Once | INTRAVENOUS | Status: DC
  Administered 2018-02-17: 1000 mg via INTRAVENOUS
  Filled 2018-02-17: qty 200

## 2018-02-17 MED ORDER — ENSURE ENLIVE PO LIQD
237.0000 mL | Freq: Two times a day (BID) | ORAL | Status: DC
  Administered 2018-02-17: 237 mL via ORAL
  Filled 2018-02-17: qty 237

## 2018-02-17 NOTE — Progress Notes (Signed)
Pharmacy Antibiotic Note  Charles Coffey is a 83 y.o. male admitted on 02/16/2018 with bladder cancer. Starting broad spectrum antibiotics for complicated UTI. Pt is likely immunocompromised from recent chemotherapy. Pt has had urine culture positive for mrsa and pseudomonas in the past month.  Plan: 1) Will change vancomycin from 1250mg  IV q24 to 1250mg  IV q36 for goal AUC 400-500 2) Continue current Zosyn dosing, but would recommend change to cefepime to help prevent further risk of nephrotoxicity with combo of vanc/Zosyn 3) Daily SCr   Height: 5\' 8"  (172.7 cm) Weight: 179 lb (81.2 kg) IBW/kg (Calculated) : 68.4  Temp (24hrs), Avg:98.3 F (36.8 C), Min:97.9 F (36.6 C), Max:98.9 F (37.2 C)  Recent Labs  Lab 02/14/18 1641 02/15/18 0845 02/16/18 2336  WBC  --  5.3 7.4  CREATININE 1.53* 1.66* 1.66*     Allergies  Allergen Reactions  . Antihistamines, Loratadine-Type Other (See Comments)    Unable to urinate  . Statins Other (See Comments)    liver effects    Adrian Saran, PharmD, BCPS Pager [phone] 02/17/2018 8:37 AM

## 2018-02-17 NOTE — Plan of Care (Addendum)
83 yo M with advanced bladder cancer that is growing into rectum.  Admitted 2/4-2/9 with confusion, UTI, urinary retention.  Has B nephrostomy tubes and coude placed / replaced during that visit.  UCx grew MRSA which was felt to be contaminate.  Was on Cipro still.  Despite Cipro developed pus draining from R nephrostomy tube today and went to ED.  UA doesn't look bad, but EDP thinks that's just from urine produced from bladder (which may not be communicating with R nephrostomy tube).  Zosyn / vanc in ED, Will put in for Med-surg.  IR consult in AM

## 2018-02-17 NOTE — H&P (Signed)
History and Physical    Charles Coffey QVZ:[id number] DOB: May 08, 1935 DOA: 02/16/2018  PCP: [name], MD  Patient coming from: Home  I have personally briefly reviewed patient's old medical records in [place]  Chief Complaint: Flank pain  HPI: Charles Coffey is a 83 y.o. male with medical history significant of advanced bladder cancer which is hydronephrosis and invading the rectum, HTN, CAD, CHF.  Patient was admitted 2/4-2/9 for AMS associated with UTI and urinary retention.  He already had B nephrostomy tubes in place, but also had a coude placed during that admit (the coude has since been removed, R nephrostomy is now draining his bladder as well, nephrostomy tubes last replaced 02/04/18).  UCx during that admit grew out pan-sensitive pseudomonas as well as MRSA.  MRSA felt to be contaminate, patient ultimately discharged.  Despite being discharged with 10 day course of Cipro, patient presents to the ED today with c/o erythema, pain, and purulent drainage at incision for R nephrostomy tube.  Pain started 2 days ago, but noticed purulent drainage today when changing dressing.   ED Course: UA with blood.  CT shows cellulitis / myositis surrounding the R nephrostomy tube.   Review of Systems: As per HPI otherwise 10 point review of systems negative.   Past Medical History:  Diagnosis Date  . Aortic atherosclerosis ([place])   . BPH with urinary obstruction   . CAD (coronary artery disease)    a.  MI 1995, CABG x 3 2002 (patient says that he had LIMA and RIMA grafts). b. ETT-Cardiolite (10/15) with EF 48%, apical scar, no ischemia. c. Nuc 07/2017 abnormal -> cath was performed,  patent LIMA to LAD and SVG to OM 2. RIMA to RCA is atretic. The right coronary artery is occluded distally with extensive collaterals from the LAD, medical therapy.   . Cancer [facility])    bladder cancer  . Cervical spondylosis without myelopathy 2020/05/1816  . Chronic low back pain    sees Dr. Kary Kos   . GERD (gastroesophageal reflux disease)   . Hiatal hernia   . Hyperlipidemia   . Hypertension   . IBS (irritable bowel syndrome)   . Ischemic cardiomyopathy   . Myocardial infarction ([place])   . RBBB   . Restless legs syndrome   . Statin intolerance   . Syncope    a. in 2015 - no apparent cause, was taking sleep medicine at the time. Cardiac workup unremarkable.  . Tubular adenoma of colon     Past Surgical History:  Procedure Laterality Date  . COLONOSCOPY  10/17/2014   per Dr. Hilarie Fredrickson, tubular adenomas, repeat in 3 yrs   . COLONOSCOPY WITH PROPOFOL N/A 12/01/2017   Procedure: COLONOSCOPY WITH PROPOFOL;  Surgeon: Milus Banister, MD;  Location: [facility];  Service: Endoscopy;  Laterality: N/A;  . CYSTOSCOPY W/ RETROGRADES Left 11/25/2017   Procedure: CYSTOSCOPY WITH RETROGRADE PYELOGRAM;  Surgeon: Irine Seal, MD;  Location: [facility] ORS;  Service: Urology;  Laterality: Left;  . HEART BYPASS    . IR CONVERT RIGHT NEPHROSTOMY TO NEPHROURETERAL CATH  02/04/2018  . IR FLUORO GUIDE CV LINE RIGHT  12/27/2017  . IR NEPHROSTOGRAM LEFT THRU EXISTING ACCESS  12/24/2017  . IR NEPHROSTOMY EXCHANGE LEFT  01/28/2018  . IR NEPHROSTOMY EXCHANGE LEFT  02/04/2018  . IR NEPHROSTOMY EXCHANGE RIGHT  12/24/2017  . IR NEPHROSTOMY EXCHANGE RIGHT  01/28/2018  . IR NEPHROSTOMY PLACEMENT LEFT  11/28/2017  . IR NEPHROSTOMY PLACEMENT RIGHT  12/03/2017  . IR US GUIDE VASC ACCESS RIGHT  12/27/2017  . LAPAROSCOPY N/A 12/01/2017   Procedure: LAPAROSCOPIC DIVERTING OSTOMY;  Surgeon: Stark Klein, MD;  Location: [facility];  Service: General;  Laterality: N/A;  . LEFT HEART CATH AND CORS/GRAFTS ANGIOGRAPHY N/A 08/25/2017   Procedure: LEFT HEART CATH AND CORS/GRAFTS ANGIOGRAPHY;  Surgeon: Wellington Hampshire, MD;  Location: [place] CV LAB;  Service: Cardiovascular;  Laterality: N/A;  . LUMBAR FUSION  2003   L3-L4  . PROSTATE SURGERY  06-27-12   per Dr. Roni Bread, had CTT  . TONSILLECTOMY    . TRANSURETHRAL RESECTION OF  BLADDER TUMOR N/A 11/25/2017   Procedure: TRANSURETHRAL RESECTION OF BLADDER TUMOR (TURBT);  Surgeon: Irine Seal, MD;  Location: [facility];  Service: Urology;  Laterality: N/A;     reports that he quit smoking about 39 years ago. His smoking use included cigarettes. he has never used smokeless tobacco. He reports that he drinks about 1.2 oz of alcohol per week. He reports that he does not use drugs.  Allergies  Allergen Reactions  . Antihistamines, Loratadine-Type Other (See Comments)    Unable to urinate  . Statins Other (See Comments)    liver effects    Family History  Problem Relation Age of Onset  . Heart attack Mother   . Aneurysm Father        femoral artery  . Heart disease Brother   . Heart disease Maternal Uncle        x 2  . Colon cancer Neg Hx   . Esophageal cancer Neg Hx   . Pancreatic cancer Neg Hx   . Kidney disease Neg Hx   . Liver disease Neg Hx      Prior to Admission medications   Medication Sig Start Date End Date Taking? Authorizing Provider  acetaminophen (TYLENOL) 500 MG tablet Take 1,000 mg by mouth every 8 (eight) hours as needed for mild pain or moderate pain.    [provider]  carvedilol (COREG) 6.25 MG tablet Take 1 tablet (6.25 mg total) 2 (two) times daily with a meal by mouth. 11/09/17   Larey Dresser, MD  ciprofloxacin (CIPRO) 500 MG tablet Take 1 tablet (500 mg total) by mouth 2 (two) times daily for 10 days. 02/07/18 02/17/18  Janece Canterbury, MD  diphenhydramine-acetaminophen (TYLENOL PM EXTRA STRENGTH) 25-500 MG TABS tablet Take 1 tablet by mouth at bedtime as needed (pain,sleep).     [provider]  feeding supplement, ENSURE ENLIVE, (ENSURE ENLIVE) LIQD Take 237 mLs by mouth 2 (two) times daily between meals. 02/03/18   Janece Canterbury, MD  furosemide (LASIX) 40 MG tablet Take 40 mg by mouth daily as needed for edema or shortness of breath. 02/07/18   [provider]  hydrALAZINE (APRESOLINE) 25 MG tablet Take 1  tablet (25 mg total) by mouth daily. Patient not taking: Reported on 02/08/2018 02/07/18 05/08/18  Imogene Burn, PA-C  Hydrocortisone Acetate [facility] RE) Place 1 application rectally as needed (hemorrhoids).    [provider]  lidocaine-prilocaine (EMLA) cream Apply 1 application topically as needed. 12/15/17   Wyatt Portela, MD  linaclotide (LINZESS) 290 MCG CAPS capsule TAKE 1 CAPSULE (290 MCG TOTAL) BY MOUTH DAILY. [place] A MEAL 03/30/17   Pyrtle, Lajuan Lines, MD  mirabegron ER (MYRBETRIQ) 25 MG TB24 tablet Take 1 tablet (25 mg total) by mouth daily. 02/04/18   Janece Canterbury, MD  Multiple Vitamin (MULTIVITAMIN WITH MINERALS) TABS tablet Take  1 tablet by mouth daily. 01/05/18   Roxan Hockey, MD  nitroGLYCERIN (NITROSTAT) 0.4 MG SL tablet Place 1 tablet (0.4 mg total) under the tongue every 5 (five) minutes as needed. 08/19/17 01/01/18  Charlie Pitter, PA-C  oxyCODONE (OXYCONTIN) 10 mg 12 hr tablet Take 1 tablet (10 mg total) by mouth 2 (two) times daily. 02/16/18   Wyatt Portela, MD  oxyCODONE-acetaminophen (PERCOCET) 10-325 MG tablet Take 1 tablet by mouth every 4 (four) hours as needed for pain. 02/16/18   Wyatt Portela, MD  pramipexole (MIRAPEX) 1 MG tablet Take 1 mg by mouth daily.    [provider]  prochlorperazine (COMPAZINE) 10 MG tablet Take 1 tablet (10 mg total) by mouth every 6 (six) hours as needed for nausea or vomiting. 12/15/17   Wyatt Portela, MD  ranitidine (ZANTAC) 300 MG tablet TAKE 1 TABLET BY MOUTH TWICE A DAY 02/10/18   Pyrtle, Lajuan Lines, MD  Tetrahydroz-Glyc-Hyprom-PEG (VISINE MAXIMUM REDNESS RELIEF OP) Place 2 drops into both eyes 2 (two) times daily as needed (dry eyes).     [provider]  traZODone (DESYREL) 50 MG tablet Take 1 tablet (50 mg total) by mouth at bedtime as needed for sleep. 01/14/18   [name], MD    Physical Exam: Vitals:   02/16/18 2245 02/17/18 0129  BP: 109/66 99/67  Pulse: 96 79  Resp: 18 16    Temp: 97.9 F (36.6 C) 98.1 F (36.7 C)  TempSrc: Oral Oral  SpO2: 100% 98%  Weight: 81.2 kg (179 lb)   Height: 5\' 8"  (1.727 m)     Constitutional: NAD, calm, comfortable Eyes: PERRL, lids and conjunctivae normal ENMT: Mucous membranes are moist. Posterior pharynx clear of any exudate or lesions.Normal dentition.  Neck: normal, supple, no masses, no thyromegaly Respiratory: clear to auscultation bilaterally, no wheezing, no crackles. Normal respiratory effort. No accessory muscle use.  Cardiovascular: Regular rate and rhythm, no murmurs / rubs / gallops. No extremity edema. 2+ pedal pulses. No carotid bruits.  Abdomen: no tenderness, no masses palpated. No hepatosplenomegaly. Bowel sounds positive.  Musculoskeletal: no clubbing / cyanosis. No joint deformity upper and lower extremities. Good ROM, no contractures. Normal muscle tone.  Skin: Scant purulent drainage at R nephrostomy tube incision. Neurologic: CN 2-12 grossly intact. Sensation intact, DTR normal. Strength 5/5 in all 4.  Psychiatric: Normal judgment and insight. Alert and oriented x 3. Normal mood.    Labs on Admission: I have personally reviewed following labs and imaging studies  CBC: Recent Labs  Lab 02/15/18 0845 02/16/18 2336  WBC 5.3 7.4  NEUTROABS 3.0 4.5  HGB  --  8.3*  HCT 24.7* 25.4*  MCV 92.5 96.2  PLT 157 814   Basic Metabolic Panel: Recent Labs  Lab 02/14/18 1641 02/15/18 0845 02/16/18 2336  NA 134* 136 135  K 4.5 3.7 3.8  CL 101 104 104  CO2 25 20* 20*  GLUCOSE 112* 125 109*  BUN 27* 26 31*  CREATININE 1.53* 1.66* 1.66*  CALCIUM 9.7 9.6 9.3   GFR: Estimated Creatinine Clearance: 34.3 mL/min (A) (by C-G formula based on SCr of 1.66 mg/dL (H)). Liver Function Tests: Recent Labs  Lab 02/15/18 0845  AST 18  ALT 10  ALKPHOS 75  BILITOT 0.3  PROT 6.9  ALBUMIN 3.3*   No results for input(s): LIPASE, AMYLASE in the last 168 hours. No results for input(s): AMMONIA in the last 168  hours. Coagulation Profile: No results for input(s): INR,  PROTIME in the last 168 hours. Cardiac Enzymes: No results for input(s): CKTOTAL, CKMB, CKMBINDEX, TROPONINI in the last 168 hours. BNP (last 3 results) No results for input(s): PROBNP in the last 8760 hours. HbA1C: No results for input(s): HGBA1C in the last 72 hours. CBG: No results for input(s): GLUCAP in the last 168 hours. Lipid Profile: No results for input(s): CHOL, HDL, LDLCALC, TRIG, CHOLHDL, LDLDIRECT in the last 72 hours. Thyroid Function Tests: No results for input(s): TSH, T4TOTAL, FREET4, T3FREE, THYROIDAB in the last 72 hours. Anemia Panel: No results for input(s): VITAMINB12, FOLATE, FERRITIN, TIBC, IRON, RETICCTPCT in the last 72 hours. Urine analysis:    Component Value Date/Time   COLORURINE YELLOW 02/16/2018 2323   APPEARANCEUR CLOUDY (A) 02/16/2018 2323   LABSPEC >1.030 (H) 02/16/2018 2323   PHURINE 6.0 02/16/2018 2323   GLUCOSEU NEGATIVE 02/16/2018 2323   HGBUR LARGE (A) 02/16/2018 2323   HGBUR negative 10/15/2010 1251   BILIRUBINUR NEGATIVE 02/16/2018 2323   BILIRUBINUR n 12/01/2016 1521   KETONESUR NEGATIVE 02/16/2018 2323   PROTEINUR >300 (A) 02/16/2018 2323   UROBILINOGEN 0.2 12/01/2016 1521   UROBILINOGEN 0.2 10/15/2010 1251   NITRITE NEGATIVE 02/16/2018 2323   LEUKOCYTESUR NEGATIVE 02/16/2018 2323    Radiological Exams on Admission: Ct Renal Stone Study  Result Date: 02/16/2018 CLINICAL DATA:  Purulent drainage, pain and redness or right nephrostomy site. EXAM: CT ABDOMEN AND PELVIS WITHOUT CONTRAST TECHNIQUE: Multidetector CT imaging of the abdomen and pelvis was performed following the standard protocol without IV contrast. COMPARISON:  Noncontrast CT 01/01/2018 FINDINGS: Lower chest: Calcified granuloma in the right lower lobe. The previous questioned 8 mm right lower lobe nodule is not seen, however may not be included in field of view. Resolved pleural effusions from prior. There are  coronary artery calcifications. The heart is enlarged. Hepatobiliary: No focal hepatic lesion allowing for lack contrast. Gallbladder physiologically distended, no calcified stone. No biliary dilatation. Pancreas: No ductal dilatation or inflammation. Spleen: Normal in size without focal abnormality. Adrenals/Urinary Tract: No adrenal nodule. Right nephrostomy tube in place with pigtail in the renal pelvis. Mild skin thickening, soft tissue and muscle edema at the skin exit site without subcutaneous fluid collection. Right ureteral stent is in place. There is no hydronephrosis. Left renal atrophy with pigtail catheter in place. No left hydronephrosis. Urinary bladder is nondistended and not well evaluated, bladder wall thickening again suspected. Stomach/Bowel: Similar rectal wall thickening and reticulation of the perirectal fat. Diverting colostomy of the transverse colon unchanged in appearance. Mild fecalization of small bowel contents suggesting slow transit. No small bowel inflammation. Stomach physiologically distended containing ingested material. Vascular/Lymphatic: No evidence of adenopathy allowing for noncontrast exam. Aortic atherosclerosis without aneurysm. Peripherally calcified low-density structure at the aortocaval station at the level of the lower kidneys is chronic and unchanged from most remote imaging, CT 11/11/2017. Reproductive: Enlarged prostate gland again seen, spanning 5.4 cm transverse. Other: Fat within both inguinal canals, left greater than right. Soft tissue stranding in the pelvis is unchanged from prior exam. No ascites. No free air. No intra-abdominal abscess. Musculoskeletal: Postsurgical and degenerative change in the spine. Bilateral L5 pars interarticularis defects with mild anterolisthesis of L5 on S1. No acute osseous abnormality. IMPRESSION: 1. Superficial inflammation of the skin, subcutaneous soft tissues and traversed muscle about the right nephrostomy tube suggesting  cellulitis. No abscess. 2. Right ureteral stent in place.  No right hydronephrosis. 3. Left renal atrophy with nephrostomy tube in place. 4. Decompressed bladder with bladder wall thickening.  Unchanged rectal wall thickening and perirectal inflammation, possibly secondary to prior radiation. 5.  Aortic Atherosclerosis (ICD10-I70.0). Electronically Signed   By: Jeb Levering M.D.   On: 02/16/2018 23:58    EKG: Independently reviewed.  Assessment/Plan Principal Problem:   Catheter-associated urinary tract infection (HCC) Active Problems:   Essential hypertension   Stage 3 chronic kidney disease (HCC)   Hydronephrosis due to obstructive malignant neoplasm of bladder (HCC)   Urothelial carcinoma of bladder (HCC)   Chronic systolic CHF (congestive heart failure) (HCC)    1. CAUTI / cellulitis / infection of R nephrostomy tube - occurred despite Cipro 1. Continue empiric zosyn and vanc 2. Note that UCx from admit earlier this month grew MRSA and Pseudomonas. 3. IR eval in AM 4. Have ordered culture of urine from R nephrostomy tube specifically. 1. UA in ED not really impressive other than for RBCs, not 100% sure that this came from R nephrostomy tube (though patient thinks it did), so repeat UA and culture has been done here at [facility], have personally observed RN taking this from R nephrostomy tube bag. 5. BPs running borderline, but no other SIRS at this time. 2. HTN - 1. Holding BP meds due to borderline BPs this evening 3. CKD stage 3 - chronic and stable 4. Bladder cancer - Advanced, causing ureteral obstruction and invading rectum. 5. Chronic systolic CHF - 1. Hold lasix in setting of acute infection 6. Chronic DVT of posterior tibial vein of R calf - 1. Previously not been on anticoagulation due to thrombocytopenia and "risk of bleed" according to Dr. Osker Mason office note from 2/19. 2. However, thrombocytopenia appears to have resolved on CBCs 2/19 and 2/20. 3. Will hold off on ordering  DVT ppx / treatment for now, and let Dr. Osker Mason address in AM.  DVT prophylaxis: None for now, see above Code Status: Full Family Communication: Wife at bedside Disposition Plan: Home after admit Consults called: None called, IR and onc put into Epic Admission status: Admit to inpatient   [place], [place] [facility] Pager [phone]  If 7AM-7PM, please contact day team taking care of patient [email or web address] Password [email or web address]  02/17/2018, 2:50 AM

## 2018-02-17 NOTE — Progress Notes (Signed)
Discharge instructions reviewed with patient and wife. Patient verbalized understanding. Patient discharged with wife.

## 2018-02-17 NOTE — Discharge Summary (Addendum)
Physician Discharge Summary  Charles Coffey GBT:[id number] DOB: 08/31/1935 DOA: 02/16/2018  PCP: [name], MD  Admit date: 02/16/2018 Discharge date: 02/17/2018  Admitted From:Home Disposition: Home   Discharge Condition: Stable CODE STATUS:Full Diet recommendation: Heart Healthy Brief/Interim Summary:  Admission H and P: Charles Coffey is a 83 y.o. male with medical history significant of advanced bladder cancer which is hydronephrosis and invading the rectum, HTN, CAD, CHF. Patient was admitted 2/4-2/9 for AMS associated with UTI and urinary retention.  He already had B nephrostomy tubes in place, but also had a coude placed during that admit (the coude has since been removed, R nephrostomy is now draining his bladder as well, nephrostomy tubes last replaced 02/04/18).  UCx during that admit grew out pan-sensitive pseudomonas as well as MRSA.  MRSA felt to be contaminate, patient ultimately discharged. Despite being discharged with 10 day course of Cipro, patient presents to the ED today with c/o erythema, pain, and purulent drainage at incision for R nephrostomy tube.  Pain started 2 days ago, but noticed purulent drainage today when changing dressing.  Hospital Course: Patient was started on broad-spectrum antibiotic with vancomycin and Zosyn.  His UA was not impressive for UTI. Patient's labs and vitals remained stable.  He was afebrile.  His white cell counts were normal. Patient evaluated by IR.  IR did not recommend any further intervention.  There was minimal to no drainage nephrostomy site.  Tubes were intact. There was no need for tube exchange or manipulation.  CT did not show any abscess but just showed soft tissue cellulitis/inflammatory changes. The patient will be discharged home today with oral ciprofloxacin for 4 more days to complete the  course of total of 14 days. Repeat blood and urine culture have been sent ,which we will follow-up He has been recommended to  follow-up with his oncologist and urologist as an outpatient.  ischarge Diagnoses:  Principal Problem:   Catheter-associated urinary tract infection ([place]) Active Problems:   Essential hypertension   Stage 3 chronic kidney disease (HCC)   Hydronephrosis due to obstructive malignant neoplasm of bladder (HCC)   Urothelial carcinoma of bladder (HCC)   Chronic systolic CHF (congestive heart failure) ([place])    Discharge Instructions  Discharge Instructions    Diet - low sodium heart healthy   Complete by:  As directed    Discharge instructions   Complete by:  As directed    1) Please follow up with your PCP in a week. 2) Follow up with your oncologist and Urologist. 3)Take prescribed medications as instructed.   Increase activity slowly   Complete by:  As directed      Allergies as of 02/17/2018      Reactions   Antihistamines, Loratadine-type Other (See Comments)   Unable to urinate   Statins Other (See Comments)   liver effects      Medication List    TAKE these medications   acetaminophen 500 MG tablet Commonly known as:  TYLENOL Take 1,000 mg by mouth every 8 (eight) hours as needed for mild pain or moderate pain.   carvedilol 6.25 MG tablet Commonly known as:  COREG Take 1 tablet (6.25 mg total) 2 (two) times daily with a meal by mouth.   ciprofloxacin 500 MG tablet Commonly known as:  CIPRO Take 1 tablet (500 mg total) by mouth 2 (two) times daily for 4 days.   feeding supplement (ENSURE ENLIVE) Liqd Take 237 mLs by mouth 2 (two) times daily between  meals.   furosemide 40 MG tablet Commonly known as:  LASIX Take 40 mg by mouth daily as needed for edema or shortness of breath.   HEMORRHOIDAL-HC RE Place 1 application rectally as needed (hemorrhoids).   hydrALAZINE 25 MG tablet Commonly known as:  APRESOLINE Take 1 tablet (25 mg total) by mouth daily.   lidocaine-prilocaine cream Commonly known as:  EMLA Apply 1 application topically as needed. What  changed:  reasons to take this   linaclotide 290 MCG Caps capsule Commonly known as:  LINZESS TAKE 1 CAPSULE (290 MCG TOTAL) BY MOUTH DAILY. 30 MINUTES PRIOR TO A MEAL   mirabegron ER 25 MG Tb24 tablet Commonly known as:  MYRBETRIQ Take 1 tablet (25 mg total) by mouth daily.   multivitamin with minerals Tabs tablet Take 1 tablet by mouth daily.   nitroGLYCERIN 0.4 MG SL tablet Commonly known as:  NITROSTAT Place 1 tablet (0.4 mg total) under the tongue every 5 (five) minutes as needed.   oxyCODONE 10 mg 12 hr tablet Commonly known as:  OXYCONTIN Take 1 tablet (10 mg total) by mouth 2 (two) times daily.   oxyCODONE-acetaminophen 10-325 MG tablet Commonly known as:  PERCOCET Take 1 tablet by mouth every 4 (four) hours as needed for pain.   pramipexole 1 MG tablet Commonly known as:  MIRAPEX Take 1 mg by mouth daily.   prochlorperazine 10 MG tablet Commonly known as:  COMPAZINE Take 1 tablet (10 mg total) by mouth every 6 (six) hours as needed for nausea or vomiting.   ranitidine 300 MG tablet Commonly known as:  ZANTAC TAKE 1 TABLET BY MOUTH TWICE A DAY   traZODone 50 MG tablet Commonly known as:  DESYREL Take 1 tablet (50 mg total) by mouth at bedtime as needed for sleep.   TYLENOL PM EXTRA STRENGTH 25-500 MG Tabs tablet Generic drug:  diphenhydramine-acetaminophen Take 1 tablet by mouth at bedtime as needed (pain,sleep).   VISINE MAXIMUM REDNESS RELIEF OP Place 2 drops into both eyes 2 (two) times daily as needed (dry eyes).      Follow-up Information    [name], MD. Schedule an appointment as soon as possible for a visit in 1 week(s).   Specialty:  Family Medicine Contact information: [place] [address] [phone]        [name], MD .   Specialty:  Cardiology Contact information: [address] [place] [address] [phone]          Allergies  Allergen Reactions  . Antihistamines,  Loratadine-Type Other (See Comments)    Unable to urinate  . Statins Other (See Comments)    liver effects    Consultations:  IR   Procedures/Studies: Dg Chest 2 View  Result Date: 01/31/2018 CLINICAL DATA:  Acute onset of fever and shortness of breath. Nausea. Confusion. EXAM: CHEST  2 VIEW COMPARISON:  Chest radiograph performed 01/03/2018 FINDINGS: The lungs are well-aerated. Vascular congestion is noted. Peribronchial thickening is noted. Mild interstitial edema has improved from the prior study. There is no evidence of pleural effusion or pneumothorax. The heart is normal in size; the mediastinal contour is within normal limits. A right-sided chest port is noted ending about the distal SVC. No acute osseous abnormalities are seen. IMPRESSION: Vascular congestion. Peribronchial thickening. Mild interstitial edema has improved from the prior study. Electronically Signed   By: Garald Balding M.D.   On: 01/31/2018 01:24   Ct Renal Stone Study  Result  Date: 02/16/2018 CLINICAL DATA:  Purulent drainage, pain and redness or right nephrostomy site. EXAM: CT ABDOMEN AND PELVIS WITHOUT CONTRAST TECHNIQUE: Multidetector CT imaging of the abdomen and pelvis was performed following the standard protocol without IV contrast. COMPARISON:  Noncontrast CT 01/01/2018 FINDINGS: Lower chest: Calcified granuloma in the right lower lobe. The previous questioned 8 mm right lower lobe nodule is not seen, however may not be included in field of view. Resolved pleural effusions from prior. There are coronary artery calcifications. The heart is enlarged. Hepatobiliary: No focal hepatic lesion allowing for lack contrast. Gallbladder physiologically distended, no calcified stone. No biliary dilatation. Pancreas: No ductal dilatation or inflammation. Spleen: Normal in size without focal abnormality. Adrenals/Urinary Tract: No adrenal nodule. Right nephrostomy tube in place with pigtail in the renal pelvis. Mild skin  thickening, soft tissue and muscle edema at the skin exit site without subcutaneous fluid collection. Right ureteral stent is in place. There is no hydronephrosis. Left renal atrophy with pigtail catheter in place. No left hydronephrosis. Urinary bladder is nondistended and not well evaluated, bladder wall thickening again suspected. Stomach/Bowel: Similar rectal wall thickening and reticulation of the perirectal fat. Diverting colostomy of the transverse colon unchanged in appearance. Mild fecalization of small bowel contents suggesting slow transit. No small bowel inflammation. Stomach physiologically distended containing ingested material. Vascular/Lymphatic: No evidence of adenopathy allowing for noncontrast exam. Aortic atherosclerosis without aneurysm. Peripherally calcified low-density structure at the aortocaval station at the level of the lower kidneys is chronic and unchanged from most remote imaging, CT 11/11/2017. Reproductive: Enlarged prostate gland again seen, spanning 5.4 cm transverse. Other: Fat within both inguinal canals, left greater than right. Soft tissue stranding in the pelvis is unchanged from prior exam. No ascites. No free air. No intra-abdominal abscess. Musculoskeletal: Postsurgical and degenerative change in the spine. Bilateral L5 pars interarticularis defects with mild anterolisthesis of L5 on S1. No acute osseous abnormality. IMPRESSION: 1. Superficial inflammation of the skin, subcutaneous soft tissues and traversed muscle about the right nephrostomy tube suggesting cellulitis. No abscess. 2. Right ureteral stent in place.  No right hydronephrosis. 3. Left renal atrophy with nephrostomy tube in place. 4. Decompressed bladder with bladder wall thickening. Unchanged rectal wall thickening and perirectal inflammation, possibly secondary to prior radiation. 5.  Aortic Atherosclerosis (ICD10-I70.0). Electronically Signed   By: Jeb Levering M.D.   On: 02/16/2018 23:58   Ir Convert  Right Nephrostomy To Nephroureteral Cath  Result Date: 02/07/2018 INDICATION: 83 year old male with a history of bilateral ureteral obstruction and bladder outlet obstruction EXAM: IR EXCHANGE NEPHROSTOMY LEFT; IR CONVERT NEPHROMSTOMY TO NEPHROURETERAL CATH RIGHT COMPARISON:  None. MEDICATIONS: None ANESTHESIA/SEDATION: None CONTRAST:  10 cc into the collecting system(s) FLUOROSCOPY TIME:  Fluoroscopy Time: 2 minutes 2 0 seconds (25.0 mGy). COMPLICATIONS: None PROCEDURE: Informed written consent was obtained from the patient after a thorough discussion of the procedural risks, benefits and alternatives. All questions were addressed. Maximal Sterile Barrier Technique was utilized including caps, mask, sterile gowns, sterile gloves, sterile drape, hand hygiene and skin antiseptic. A timeout was performed prior to the initiation of the procedure. Patient positioned prone position on the fluoroscopy table. Scout images were acquired. The patient was then prepped and draped in the usual sterile fashion. 1% lidocaine was used to anesthetize the skin and subcutaneous tissues for local anesthesia. Left kidney was first addressed. Modified Seldinger technique was used to exchange the indwelling percutaneous nephrostomy. A new 10 [place] multipurpose drain catheter was placed. The right collecting system was then  addressed. Contrast infused in the collecting system confirmed position in the collecting system. A Bentson wire was used to remove the indwelling drain. A short Kumpe the catheter was used navigate the Bentson wire into the distal ureter. Contrast injection confirmed location in the distal ureter. Catheter and wire were then navigated into the urinary bladder. Again, wire was removed and contrast confirmed location. Bentson wire was then used to estimate internal length of the nephroureteral tube, 24 cm length estimated. Modified Seldinger technique was then used to place a new percutaneous nephroureteral tube.  Final images were stored. Bilateral catheters were placed to gravity drainage. Patient tolerated the procedure well and remained hemodynamically stable throughout. No complications were encountered and no significant blood loss encountered IMPRESSION: Status post exchange of left-sided percutaneous nephrostomy with placement of a new 10 French drain, and conversion of right-sided PCN into a percutaneous nephroureteral drain, 24 cm length. Catheters were both placed to gravity. Signed, Dulcy Fanny. Earleen Newport, DO [facility] Radiology Electronically Signed   By: Corrie Mckusick D.O.   On: 02/07/2018 13:31   Ir Nephrostomy Exchange Left  Result Date: 02/07/2018 INDICATION: 83 year old male with a history of bilateral ureteral obstruction and bladder outlet obstruction EXAM: IR EXCHANGE NEPHROSTOMY LEFT; IR CONVERT NEPHROMSTOMY TO NEPHROURETERAL CATH RIGHT COMPARISON:  None. MEDICATIONS: None ANESTHESIA/SEDATION: None CONTRAST:  10 cc into the collecting system(s) FLUOROSCOPY TIME:  Fluoroscopy Time: 2 minutes 2 0 seconds (25.0 mGy). COMPLICATIONS: None PROCEDURE: Informed written consent was obtained from the patient after a thorough discussion of the procedural risks, benefits and alternatives. All questions were addressed. Maximal Sterile Barrier Technique was utilized including caps, mask, sterile gowns, sterile gloves, sterile drape, hand hygiene and skin antiseptic. A timeout was performed prior to the initiation of the procedure. Patient positioned prone position on the fluoroscopy table. Scout images were acquired. The patient was then prepped and draped in the usual sterile fashion. 1% lidocaine was used to anesthetize the skin and subcutaneous tissues for local anesthesia. Left kidney was first addressed. Modified Seldinger technique was used to exchange the indwelling percutaneous nephrostomy. A new 10 [place] multipurpose drain catheter was placed. The right  collecting system was then addressed. Contrast infused in the collecting system confirmed position in the collecting system. A Bentson wire was used to remove the indwelling drain. A short Kumpe the catheter was used navigate the Bentson wire into the distal ureter. Contrast injection confirmed location in the distal ureter. Catheter and wire were then navigated into the urinary bladder. Again, wire was removed and contrast confirmed location. Bentson wire was then used to estimate internal length of the nephroureteral tube, 24 cm length estimated. Modified Seldinger technique was then used to place a new percutaneous nephroureteral tube. Final images were stored. Bilateral catheters were placed to gravity drainage. Patient tolerated the procedure well and remained hemodynamically stable throughout. No complications were encountered and no significant blood loss encountered IMPRESSION: Status post exchange of left-sided percutaneous nephrostomy with placement of a new 10 French drain, and conversion of right-sided PCN into a percutaneous nephroureteral drain, 24 cm length. Catheters were both placed to gravity. Signed, Dulcy Fanny. Earleen Newport, DO [facility] Radiology Electronically Signed   By: Corrie Mckusick D.O.   On: 02/07/2018 13:31   Ir Nephrostomy Exchange Left  Result Date: 01/28/2018 INDICATION: 83 year old male with a history of bilateral ureteral obstruction. EXAM: IR EXCHANGE NEPHROSTOMY RIGHT; IR EXCHANGE NEPHROSTOMY LEFT COMPARISON:  CT 01/01/2018, 12/24/2017 MEDICATIONS: None ANESTHESIA/SEDATION:  None CONTRAST:  20 cc-administered into the collecting system(s) FLUOROSCOPY TIME:  Fluoroscopy Time: 1 minutes 0 seconds (21.0 mGy). COMPLICATIONS: None immediate. PROCEDURE: Informed written consent was obtained from the patient after a thorough discussion of the procedural risks, benefits and alternatives. All questions were addressed. Maximal Sterile Barrier  Technique was utilized including caps, mask, sterile gowns, sterile gloves, sterile drape, hand hygiene and skin antiseptic. A timeout was performed prior to the initiation of the procedure. Patient position prone position on the table, and the indwelling bilateral percutaneous nephrostomy were prepped and draped in the usual sterile fashion along with the bilateral flank. Scout image was obtained. Left: Contrast was infused through the indwelling catheter, opacifying the collecting system. The left catheter was then ligated, and an 035 wire was advanced to the collecting system. The catheter was then removed over the wire. A new, 10 French percutaneous nephrostomy tube was advanced over the wire, with the radial opaque marker positioned at the collecting system. The wire and inner dilator/stiffener were removed, and the pigtail catheter was formed in the collecting system. Contrast was infused through the catheter confirming position. A final image was stored. Right: Contrast was infused through the indwelling catheter, opacifying the collecting system, as well as the proximal ureter. The right catheter was then ligated, and an 035 wire was advanced to the collecting system. The catheter was then removed over the wire. A new, 10 French percutaneous nephrostomy tube was advanced over the wire, with the radial opaque marker positioned at the collecting system. The wire and inner dilator/stiffener were removed, and the pigtail catheter was formed in the collecting system. Contrast was infused through the catheter confirming position. A final image was stored. The patient tolerated the procedure well and remained hemodynamically stable throughout. No complications were encountered and no significant blood loss was encountered. IMPRESSION: Status post bilateral percutaneous nephrostomy exchange. New 10 French catheters placed bilaterally. Signed, Dulcy Fanny. Earleen Newport, DO [facility]  [facility] Radiology Electronically Signed   By: Corrie Mckusick D.O.   On: 01/28/2018 16:24   Ir Nephrostomy Exchange Right  Result Date: 01/28/2018 INDICATION: 83 year old male with a history of bilateral ureteral obstruction. EXAM: IR EXCHANGE NEPHROSTOMY RIGHT; IR EXCHANGE NEPHROSTOMY LEFT COMPARISON:  CT 01/01/2018, 12/24/2017 MEDICATIONS: None ANESTHESIA/SEDATION: None CONTRAST:  20 cc-administered into the collecting system(s) FLUOROSCOPY TIME:  Fluoroscopy Time: 1 minutes 0 seconds (21.0 mGy). COMPLICATIONS: None immediate. PROCEDURE: Informed written consent was obtained from the patient after a thorough discussion of the procedural risks, benefits and alternatives. All questions were addressed. Maximal Sterile Barrier Technique was utilized including caps, mask, sterile gowns, sterile gloves, sterile drape, hand hygiene and skin antiseptic. A timeout was performed prior to the initiation of the procedure. Patient position prone position on the table, and the indwelling bilateral percutaneous nephrostomy were prepped and draped in the usual sterile fashion along with the bilateral flank. Scout image was obtained. Left: Contrast was infused through the indwelling catheter, opacifying the collecting system. The left catheter was then ligated, and an 035 wire was advanced to the collecting system. The catheter was then removed over the wire. A new, 10 French percutaneous nephrostomy tube was advanced over the wire, with the radial opaque marker positioned at the collecting system. The wire and inner dilator/stiffener were removed, and the pigtail catheter was formed in the collecting system. Contrast was infused through the catheter confirming position. A final image was stored. Right: Contrast was infused through the indwelling catheter, opacifying the collecting system,  as well as the proximal ureter. The right catheter was then ligated, and an 035 wire was advanced to the collecting system. The catheter  was then removed over the wire. A new, 10 French percutaneous nephrostomy tube was advanced over the wire, with the radial opaque marker positioned at the collecting system. The wire and inner dilator/stiffener were removed, and the pigtail catheter was formed in the collecting system. Contrast was infused through the catheter confirming position. A final image was stored. The patient tolerated the procedure well and remained hemodynamically stable throughout. No complications were encountered and no significant blood loss was encountered. IMPRESSION: Status post bilateral percutaneous nephrostomy exchange. New 10 French catheters placed bilaterally. Signed, Dulcy Fanny. Earleen Newport, DO [facility] Radiology Electronically Signed   By: Corrie Mckusick D.O.   On: 01/28/2018 16:24   (Echo, Carotid, EGD, Colonoscopy, ERCP)    Subjective: Patient seen and examined the bedside this morning.  Remains comfortable.  Afebrile.  Patient evaluated by interventional radiology and cleared for discharge.  Discharge Exam: Vitals:   02/17/18 0129 02/17/18 0251  BP: 99/67 108/77  Pulse: 79 76  Resp: 16 17  Temp: 98.1 F (36.7 C) 98.9 F (37.2 C)  SpO2: 98% 99%   Vitals:   02/16/18 2245 02/17/18 0129 02/17/18 0251  BP: 109/66 99/67 108/77  Pulse: 96 79 76  Resp: 18 16 17   Temp: 97.9 F (36.6 C) 98.1 F (36.7 C) 98.9 F (37.2 C)  TempSrc: Oral Oral Oral  SpO2: 100% 98% 99%  Weight: 81.2 kg (179 lb)    Height: 5\' 8"  (1.727 m)      General: Pt is alert, awake, not in acute distress Cardiovascular: RRR, S1/S2 +, no rubs, no gallops Respiratory: CTA bilaterally, no wheezing, no rhonchi Abdominal: Soft, NT, ND, bowel sounds + B/L nephrostomy tube draining clear urine on the bags, area of erythema /slight crusted drainage on the right nephrostomy tube area Extremities: no edema, no cyanosis    The results of significant diagnostics from this hospitalization  (including imaging, microbiology, ancillary and laboratory) are listed below for reference.     Microbiology: Recent Results (from the past 240 hour(s))  MRSA PCR Screening     Status: None   Collection Time: 02/17/18 11:25 AM  Result Value Ref Range Status   MRSA by PCR NEGATIVE NEGATIVE Final    Comment:        The GeneXpert MRSA Assay (FDA approved for NASAL specimens only), is one component of a comprehensive MRSA colonization surveillance program. It is not intended to diagnose MRSA infection nor to guide or monitor treatment for MRSA infections. Performed at [facility], [place] [address], [place], [place] [address]      Labs: BNP (last 3 results) Recent Labs    01/31/18 0153  BNP 3,875.6*   Basic Metabolic Panel: Recent Labs  Lab 02/14/18 1641 02/15/18 0845 02/16/18 2336  NA 134* 136 135  K 4.5 3.7 3.8  CL 101 104 104  CO2 25 20* 20*  GLUCOSE 112* 125 109*  BUN 27* 26 31*  CREATININE 1.53* 1.66* 1.66*  CALCIUM 9.7 9.6 9.3   Liver Function Tests: Recent Labs  Lab 02/15/18 0845  AST 18  ALT 10  ALKPHOS 75  BILITOT 0.3  PROT 6.9  ALBUMIN 3.3*   No results for input(s): LIPASE, AMYLASE in the last 168 hours. No results for input(s): AMMONIA in the last 168 hours. CBC: Recent Labs  Lab 02/15/18  0845 02/16/18 2336  WBC 5.3 7.4  NEUTROABS 3.0 4.5  HGB  --  8.3*  HCT 24.7* 25.4*  MCV 92.5 96.2  PLT 157 158   Cardiac Enzymes: No results for input(s): CKTOTAL, CKMB, CKMBINDEX, TROPONINI in the last 168 hours. BNP: Invalid input(s): POCBNP CBG: No results for input(s): GLUCAP in the last 168 hours. D-Dimer No results for input(s): DDIMER in the last 72 hours. Hgb A1c No results for input(s): HGBA1C in the last 72 hours. Lipid Profile No results for input(s): CHOL, HDL, LDLCALC, TRIG, CHOLHDL, LDLDIRECT in the last 72 hours. Thyroid function studies No results for input(s): TSH, T4TOTAL, T3FREE, THYROIDAB in the last 72  hours.  Invalid input(s): FREET3 Anemia work up No results for input(s): VITAMINB12, FOLATE, FERRITIN, TIBC, IRON, RETICCTPCT in the last 72 hours. Urinalysis    Component Value Date/Time   COLORURINE YELLOW 02/17/2018 0258   APPEARANCEUR CLEAR 02/17/2018 0258   LABSPEC 1.011 02/17/2018 0258   PHURINE 5.0 02/17/2018 0258   GLUCOSEU NEGATIVE 02/17/2018 0258   HGBUR LARGE (A) 02/17/2018 0258   HGBUR negative 10/15/2010 1251   BILIRUBINUR NEGATIVE 02/17/2018 0258   BILIRUBINUR n 12/01/2016 1521   KETONESUR NEGATIVE 02/17/2018 0258   PROTEINUR 100 (A) 02/17/2018 0258   UROBILINOGEN 0.2 12/01/2016 1521   UROBILINOGEN 0.2 10/15/2010 1251   NITRITE NEGATIVE 02/17/2018 0258   LEUKOCYTESUR TRACE (A) 02/17/2018 0258   Sepsis Labs Invalid input(s): PROCALCITONIN,  WBC,  LACTICIDVEN Microbiology Recent Results (from the past 240 hour(s))  MRSA PCR Screening     Status: None   Collection Time: 02/17/18 11:25 AM  Result Value Ref Range Status   MRSA by PCR NEGATIVE NEGATIVE Final    Comment:        The GeneXpert MRSA Assay (FDA approved for NASAL specimens only), is one component of a comprehensive MRSA colonization surveillance program. It is not intended to diagnose MRSA infection nor to guide or monitor treatment for MRSA infections. Performed at [facility], [place] [address] [place], [place] [address]      Time coordinating discharge: Over 30 minutes  SIGNED:   Marene Lenz, MD  [facility] 02/17/2018, 1:23 PM Pager [phone]  If 7PM-7AM, please contact night-coverage [email or web address] Password [email or web address]

## 2018-02-17 NOTE — Progress Notes (Signed)
Chief Complaint: Patient was seen today for cellulitis at right PCN site   Supervising Physician: Marybelle Killings  Patient Status: [facility] - In-pt  Subjective: Pt known to IR service. Hx of advanced bladder cancer with hx of bilateral PCNs Last exchange was 2/8 and (R)PCn was exchanged for perc nephroureteral catheter extending into the bladder. This has been very helpful to decompress the bladder.  He's had some chronic pain at the site since that procedure and recently noticed some redness and purulent drainage from the skin site on the right. He's had continued UOP from both bags. He was admitted yesterday and started on IV abx. He feels much better this am. IR is asked to eval nephrostomy sites.   Objective: Physical Exam: BP 108/77 (BP Location: Left Arm)   Pulse 76   Temp 98.9 F (37.2 C) (Oral)   Resp 17   Ht 5\' 8"  (1.727 m)   Wt 179 lb (81.2 kg)   SpO2 99%   BMI 27.22 kg/m  (L)PCN intact, site clean, NT, clear UOP (R)PCN/nephroureteral intact. Site with some induration and erythema, mildly tender. No purulence expressed. Clear UOP in bag, bag full.   Current Facility-Administered Medications:  .  acetaminophen (TYLENOL) tablet 650 mg, 650 mg, Oral, Q6H PRN **OR** acetaminophen (TYLENOL) suppository 650 mg, 650 mg, Rectal, Q6H PRN, Etta Quill, DO .  famotidine (PEPCID) tablet 40 mg, 40 mg, Oral, BID, Alcario Drought, Jared M, DO .  feeding supplement (ENSURE ENLIVE) (ENSURE ENLIVE) liquid 237 mL, 237 mL, Oral, BID BM, Alcario Drought, Jared M, DO .  linaclotide (LINZESS) capsule 290 mcg, 290 mcg, Oral, QAC breakfast, Etta Quill, DO, 290 mcg at 02/17/18 0753 .  mirabegron ER (MYRBETRIQ) tablet 25 mg, 25 mg, Oral, Daily, Alcario Drought, Jared M, DO .  multivitamin with minerals tablet 1 tablet, 1 tablet, Oral, Daily, Alcario Drought, Jared M, DO .  naphazoline-glycerin (CLEAR EYES REDNESS) ophth solution, , Both Eyes, BID PRN, Alcario Drought, Jared M, DO .  ondansetron (ZOFRAN) tablet 4 mg, 4  mg, Oral, Q6H PRN **OR** ondansetron (ZOFRAN) injection 4 mg, 4 mg, Intravenous, Q6H PRN, Alcario Drought, Jared M, DO .  oxyCODONE-acetaminophen (PERCOCET/ROXICET) 5-325 MG per tablet 1 tablet, 1 tablet, Oral, Q4H PRN, 1 tablet at 02/17/18 0807 **AND** oxyCODONE (Oxy IR/ROXICODONE) immediate release tablet 5 mg, 5 mg, Oral, Q4H PRN, Etta Quill, DO, 5 mg at 02/17/18 0807 .  oxyCODONE (OXYCONTIN) 12 hr tablet 10 mg, 10 mg, Oral, BID, Alcario Drought, Jared M, DO .  piperacillin-tazobactam (ZOSYN) IVPB 3.375 g, 3.375 g, Intravenous, Q8H, Masters, Jake Church, RPH, Last Rate: 12.5 mL/hr at 02/17/18 0630, 3.375 g at 02/17/18 0630 .  pramipexole (MIRAPEX) tablet 1 mg, 1 mg, Oral, Daily, Alcario Drought, Jared M, DO .  traZODone (DESYREL) tablet 50 mg, 50 mg, Oral, QHS PRN, Etta Quill, DO .  [START ON 02/18/2018] vancomycin (VANCOCIN) 1,250 mg in sodium chloride 0.9 % 250 mL IVPB, 1,250 mg, Intravenous, Q36H, Adrian Saran, RPH .  vancomycin (VANCOCIN) 500 MG powder, , , ,   Labs: CBC Recent Labs    02/15/18 0845 02/16/18 2336  WBC 5.3 7.4  HGB  --  8.3*  HCT 24.7* 25.4*  PLT 157 158   BMET Recent Labs    02/15/18 0845 02/16/18 2336  NA 136 135  K 3.7 3.8  CL 104 104  CO2 20* 20*  GLUCOSE 125 109*  BUN 26 31*  CREATININE 1.66* 1.66*  CALCIUM 9.6 9.3   LFT Recent Labs  02/15/18 0845  PROT 6.9  ALBUMIN 3.3*  AST 18  ALT 10  ALKPHOS 75  BILITOT 0.3   PT/INR No results for input(s): LABPROT, INR in the last 72 hours.   Studies/Results: Ct Renal Stone Study  Result Date: 02/16/2018 CLINICAL DATA:  Purulent drainage, pain and redness or right nephrostomy site. EXAM: CT ABDOMEN AND PELVIS WITHOUT CONTRAST TECHNIQUE: Multidetector CT imaging of the abdomen and pelvis was performed following the standard protocol without IV contrast. COMPARISON:  Noncontrast CT 01/01/2018 FINDINGS: Lower chest: Calcified granuloma in the right lower lobe. The previous questioned 8 mm right lower lobe nodule  is not seen, however may not be included in field of view. Resolved pleural effusions from prior. There are coronary artery calcifications. The heart is enlarged. Hepatobiliary: No focal hepatic lesion allowing for lack contrast. Gallbladder physiologically distended, no calcified stone. No biliary dilatation. Pancreas: No ductal dilatation or inflammation. Spleen: Normal in size without focal abnormality. Adrenals/Urinary Tract: No adrenal nodule. Right nephrostomy tube in place with pigtail in the renal pelvis. Mild skin thickening, soft tissue and muscle edema at the skin exit site without subcutaneous fluid collection. Right ureteral stent is in place. There is no hydronephrosis. Left renal atrophy with pigtail catheter in place. No left hydronephrosis. Urinary bladder is nondistended and not well evaluated, bladder wall thickening again suspected. Stomach/Bowel: Similar rectal wall thickening and reticulation of the perirectal fat. Diverting colostomy of the transverse colon unchanged in appearance. Mild fecalization of small bowel contents suggesting slow transit. No small bowel inflammation. Stomach physiologically distended containing ingested material. Vascular/Lymphatic: No evidence of adenopathy allowing for noncontrast exam. Aortic atherosclerosis without aneurysm. Peripherally calcified low-density structure at the aortocaval station at the level of the lower kidneys is chronic and unchanged from most remote imaging, CT 11/11/2017. Reproductive: Enlarged prostate gland again seen, spanning 5.4 cm transverse. Other: Fat within both inguinal canals, left greater than right. Soft tissue stranding in the pelvis is unchanged from prior exam. No ascites. No free air. No intra-abdominal abscess. Musculoskeletal: Postsurgical and degenerative change in the spine. Bilateral L5 pars interarticularis defects with mild anterolisthesis of L5 on S1. No acute osseous abnormality. IMPRESSION: 1. Superficial  inflammation of the skin, subcutaneous soft tissues and traversed muscle about the right nephrostomy tube suggesting cellulitis. No abscess. 2. Right ureteral stent in place.  No right hydronephrosis. 3. Left renal atrophy with nephrostomy tube in place. 4. Decompressed bladder with bladder wall thickening. Unchanged rectal wall thickening and perirectal inflammation, possibly secondary to prior radiation. 5.  Aortic Atherosclerosis (ICD10-I70.0). Electronically Signed   By: Jeb Levering M.D.   On: 02/16/2018 23:58    Assessment/Plan: Known advanced bladder cancer. Cellulitis at (R)Nephroureteral catheter site. CT reviewed with Dr. Barbie Banner. No abscess noted, just soft tissue cellulitis/inflammatory changes. (B)tubes in good position. Pt clinically improved already with abx. No need for tube exchange or manipulation. OK to discharge on po abx. Recommend follow up with Urology for more definitive plan moving forward. Pt states saw Dr. [name] recently, but I cannot see notes    LOS: 0 days   I spent a total of 20 minutes in face to face in clinical consultation, greater than 50% of which was counseling/coordinating care for PCN cellulitis  Ascencion Dike PA-C 02/17/2018 9:57 AM

## 2018-02-17 NOTE — Progress Notes (Signed)
[facility]  Patient Status: Active (receiving services up to time of hospitalization)  [facility] is providing the following services: RN and PT  If patient discharges after hours, please call [phone].   Charles Coffey 02/17/2018, 8:55 AM

## 2018-02-17 NOTE — Progress Notes (Signed)
Pharmacy Antibiotic Note  Charles Coffey is a 83 y.o. male admitted on 02/16/2018 with bladder cancer. Starting broad spectrum antibiotics for complicated UTI. Pt is likely immunocompromised from recent chemotherapy. Pt has had urine culture positive for mrsa and pseudomonas in the past month.  Plan: -Vancomycin 1500 mg IV x1 then 1250 mg IV q24h -Zosyn 3.375 g IV q8h -Monitor renal fx, cultures, VR as needed   Height: 5\' 8"  (172.7 cm) Weight: 179 lb (81.2 kg) IBW/kg (Calculated) : 68.4  Temp (24hrs), Avg:97.9 F (36.6 C), Min:97.9 F (36.6 C), Max:97.9 F (36.6 C)  Recent Labs  Lab 02/14/18 1641 02/15/18 0845 02/16/18 2336  WBC  --  5.3 7.4  CREATININE 1.53* 1.66* 1.66*     Allergies  Allergen Reactions  . Antihistamines, Loratadine-Type Other (See Comments)    Unable to urinate  . Statins Other (See Comments)    liver effects    Antimicrobials this admission: 2/21 vancomycin > 2/21 zosyn >  Dose adjustments this admission: N/A  Microbiology results: 2/20 blood cx: 2/20 urine cx:  Harvel Quale 02/17/2018 12:48 AM

## 2018-02-18 ENCOUNTER — Telehealth: Payer: Self-pay | Admitting: Family Medicine

## 2018-02-18 ENCOUNTER — Other Ambulatory Visit: Payer: Self-pay | Admitting: Oncology

## 2018-02-18 ENCOUNTER — Ambulatory Visit: Payer: Medicare HMO | Admitting: Internal Medicine

## 2018-02-18 LAB — URINE CULTURE
Culture: NO GROWTH
Culture: NO GROWTH

## 2018-02-18 MED ORDER — AMOXICILLIN-POT CLAVULANATE 875-125 MG PO TABS: 1.0000 | ORAL_TABLET | Freq: Two times a day (BID) | ORAL | 0 refills | Status: DC

## 2018-02-18 NOTE — Progress Notes (Unsigned)
I was called by the call service regarding Charles Coffey.  He was just discharged yesterday.  He is having exactly the same problems that took him to the hospital including some pus around his nephrostomy tube, and increased abdominal pain.  I told the patient to re-presented to the emergency room so he could be properly evaluated but he absolutely refused.  I am calling him a second antibiotic--he was discharged on Cipro--namely Augmentin, he also has a home health nurse coming by tomorrow and at least he will have that much evaluation.  They understand that if his condition deteriorates she will need to go to the emergency room as indicated above

## 2018-02-18 NOTE — Telephone Encounter (Signed)
Per Dr. Sarajane Jews patient is following up with Oncology, Urology, & Cardiology, no need to complete a TCM at this time. Patient just had a TCM call on 02/07/2018 and followed up with Dr. Sarajane Jews on 02/14/2018 for office visit.

## 2018-02-19 DIAGNOSIS — I5023 Acute on chronic systolic (congestive) heart failure: Secondary | ICD-10-CM | POA: Diagnosis not present

## 2018-02-19 DIAGNOSIS — B9562 Methicillin resistant Staphylococcus aureus infection as the cause of diseases classified elsewhere: Secondary | ICD-10-CM | POA: Diagnosis not present

## 2018-02-19 DIAGNOSIS — D631 Anemia in chronic kidney disease: Secondary | ICD-10-CM | POA: Diagnosis not present

## 2018-02-19 DIAGNOSIS — N390 Urinary tract infection, site not specified: Secondary | ICD-10-CM | POA: Diagnosis not present

## 2018-02-19 DIAGNOSIS — I130 Hypertensive heart and chronic kidney disease with heart failure and stage 1 through stage 4 chronic kidney disease, or unspecified chronic kidney disease: Secondary | ICD-10-CM | POA: Diagnosis not present

## 2018-02-19 DIAGNOSIS — Z466 Encounter for fitting and adjustment of urinary device: Secondary | ICD-10-CM | POA: Diagnosis not present

## 2018-02-19 DIAGNOSIS — Z436 Encounter for attention to other artificial openings of urinary tract: Secondary | ICD-10-CM | POA: Diagnosis not present

## 2018-02-19 DIAGNOSIS — I2510 Atherosclerotic heart disease of native coronary artery without angina pectoris: Secondary | ICD-10-CM | POA: Diagnosis not present

## 2018-02-19 DIAGNOSIS — C679 Malignant neoplasm of bladder, unspecified: Secondary | ICD-10-CM | POA: Diagnosis not present

## 2018-02-19 DIAGNOSIS — Z433 Encounter for attention to colostomy: Secondary | ICD-10-CM | POA: Diagnosis not present

## 2018-02-20 ENCOUNTER — Encounter: Payer: Self-pay | Admitting: Oncology

## 2018-02-22 ENCOUNTER — Other Ambulatory Visit: Payer: Self-pay | Admitting: *Deleted

## 2018-02-22 ENCOUNTER — Telehealth: Payer: Self-pay | Admitting: *Deleted

## 2018-02-22 DIAGNOSIS — C679 Malignant neoplasm of bladder, unspecified: Secondary | ICD-10-CM | POA: Diagnosis not present

## 2018-02-22 DIAGNOSIS — I5023 Acute on chronic systolic (congestive) heart failure: Secondary | ICD-10-CM | POA: Diagnosis not present

## 2018-02-22 DIAGNOSIS — Z433 Encounter for attention to colostomy: Secondary | ICD-10-CM | POA: Diagnosis not present

## 2018-02-22 DIAGNOSIS — I130 Hypertensive heart and chronic kidney disease with heart failure and stage 1 through stage 4 chronic kidney disease, or unspecified chronic kidney disease: Secondary | ICD-10-CM | POA: Diagnosis not present

## 2018-02-22 DIAGNOSIS — Z436 Encounter for attention to other artificial openings of urinary tract: Secondary | ICD-10-CM | POA: Diagnosis not present

## 2018-02-22 DIAGNOSIS — N390 Urinary tract infection, site not specified: Secondary | ICD-10-CM | POA: Diagnosis not present

## 2018-02-22 DIAGNOSIS — Z466 Encounter for fitting and adjustment of urinary device: Secondary | ICD-10-CM | POA: Diagnosis not present

## 2018-02-22 DIAGNOSIS — I2510 Atherosclerotic heart disease of native coronary artery without angina pectoris: Secondary | ICD-10-CM | POA: Diagnosis not present

## 2018-02-22 DIAGNOSIS — D631 Anemia in chronic kidney disease: Secondary | ICD-10-CM | POA: Diagnosis not present

## 2018-02-22 DIAGNOSIS — B9562 Methicillin resistant Staphylococcus aureus infection as the cause of diseases classified elsewhere: Secondary | ICD-10-CM | POA: Diagnosis not present

## 2018-02-22 LAB — CULTURE, BLOOD (ROUTINE X 2)
CULTURE: NO GROWTH
Culture: NO GROWTH
SPECIAL REQUESTS: ADEQUATE
Special Requests: ADEQUATE

## 2018-02-22 NOTE — Progress Notes (Signed)
Referral made to IR for nephrostomy tube change

## 2018-02-22 NOTE — Telephone Encounter (Signed)
Spoke with wife lennie, let her know that referral to IR was made for nephrostomy tube change.

## 2018-02-23 ENCOUNTER — Telehealth: Payer: Self-pay | Admitting: *Deleted

## 2018-02-23 DIAGNOSIS — I255 Ischemic cardiomyopathy: Secondary | ICD-10-CM

## 2018-02-23 DIAGNOSIS — Z951 Presence of aortocoronary bypass graft: Secondary | ICD-10-CM

## 2018-02-23 DIAGNOSIS — K624 Stenosis of anus and rectum: Secondary | ICD-10-CM

## 2018-02-23 DIAGNOSIS — B9562 Methicillin resistant Staphylococcus aureus infection as the cause of diseases classified elsewhere: Secondary | ICD-10-CM | POA: Diagnosis not present

## 2018-02-23 DIAGNOSIS — I5023 Acute on chronic systolic (congestive) heart failure: Secondary | ICD-10-CM | POA: Diagnosis not present

## 2018-02-23 DIAGNOSIS — Z7982 Long term (current) use of aspirin: Secondary | ICD-10-CM

## 2018-02-23 DIAGNOSIS — K5904 Chronic idiopathic constipation: Secondary | ICD-10-CM

## 2018-02-23 DIAGNOSIS — Z433 Encounter for attention to colostomy: Secondary | ICD-10-CM

## 2018-02-23 DIAGNOSIS — M545 Low back pain: Secondary | ICD-10-CM

## 2018-02-23 DIAGNOSIS — Z466 Encounter for fitting and adjustment of urinary device: Secondary | ICD-10-CM

## 2018-02-23 DIAGNOSIS — K219 Gastro-esophageal reflux disease without esophagitis: Secondary | ICD-10-CM

## 2018-02-23 DIAGNOSIS — I739 Peripheral vascular disease, unspecified: Secondary | ICD-10-CM

## 2018-02-23 DIAGNOSIS — N390 Urinary tract infection, site not specified: Secondary | ICD-10-CM | POA: Diagnosis not present

## 2018-02-23 DIAGNOSIS — D696 Thrombocytopenia, unspecified: Secondary | ICD-10-CM

## 2018-02-23 DIAGNOSIS — I4510 Unspecified right bundle-branch block: Secondary | ICD-10-CM

## 2018-02-23 DIAGNOSIS — I130 Hypertensive heart and chronic kidney disease with heart failure and stage 1 through stage 4 chronic kidney disease, or unspecified chronic kidney disease: Secondary | ICD-10-CM | POA: Diagnosis not present

## 2018-02-23 DIAGNOSIS — C679 Malignant neoplasm of bladder, unspecified: Secondary | ICD-10-CM | POA: Diagnosis not present

## 2018-02-23 DIAGNOSIS — E785 Hyperlipidemia, unspecified: Secondary | ICD-10-CM

## 2018-02-23 DIAGNOSIS — N183 Chronic kidney disease, stage 3 (moderate): Secondary | ICD-10-CM

## 2018-02-23 DIAGNOSIS — Z87891 Personal history of nicotine dependence: Secondary | ICD-10-CM

## 2018-02-23 DIAGNOSIS — D126 Benign neoplasm of colon, unspecified: Secondary | ICD-10-CM

## 2018-02-23 DIAGNOSIS — D631 Anemia in chronic kidney disease: Secondary | ICD-10-CM

## 2018-02-23 DIAGNOSIS — Z906 Acquired absence of other parts of urinary tract: Secondary | ICD-10-CM

## 2018-02-23 NOTE — Telephone Encounter (Signed)
No note

## 2018-02-24 ENCOUNTER — Inpatient Hospital Stay (HOSPITAL_COMMUNITY): Admission: RE | Admit: 2018-02-24 | Payer: Medicare HMO | Source: Ambulatory Visit

## 2018-02-24 ENCOUNTER — Ambulatory Visit: Payer: Medicare HMO | Admitting: Internal Medicine

## 2018-02-24 ENCOUNTER — Ambulatory Visit (HOSPITAL_COMMUNITY): Payer: Medicare HMO

## 2018-02-24 ENCOUNTER — Encounter: Payer: Self-pay | Admitting: Internal Medicine

## 2018-02-24 VITALS — BP 100/50 | HR 86 | Ht 68.0 in | Wt 179.2 lb

## 2018-02-24 DIAGNOSIS — I825Z1 Chronic embolism and thrombosis of unspecified deep veins of right distal lower extremity: Secondary | ICD-10-CM | POA: Diagnosis not present

## 2018-02-24 DIAGNOSIS — N183 Chronic kidney disease, stage 3 unspecified: Secondary | ICD-10-CM

## 2018-02-24 DIAGNOSIS — I2510 Atherosclerotic heart disease of native coronary artery without angina pectoris: Secondary | ICD-10-CM | POA: Diagnosis not present

## 2018-02-24 DIAGNOSIS — C679 Malignant neoplasm of bladder, unspecified: Secondary | ICD-10-CM | POA: Diagnosis not present

## 2018-02-24 DIAGNOSIS — I25119 Atherosclerotic heart disease of native coronary artery with unspecified angina pectoris: Secondary | ICD-10-CM | POA: Diagnosis not present

## 2018-02-24 DIAGNOSIS — I493 Ventricular premature depolarization: Secondary | ICD-10-CM | POA: Diagnosis not present

## 2018-02-24 DIAGNOSIS — I5022 Chronic systolic (congestive) heart failure: Secondary | ICD-10-CM

## 2018-02-24 NOTE — Progress Notes (Signed)
Follow-up Outpatient Visit Date: 02/24/2018  Primary Care Provider: [name], MD [address] [place] [address]  Chief Complaint: Leg swelling  HPI:  [name] is a 83 y.o. year-old male with history of coronary artery disease status post remote MI and CABG in 2002, ischemic cardiomyopathy, hypertension, hyperlipidemia, IBS, restless leg syndrome, BPH, bladder cancer, GERD, renal insufficiency, anemia, and thrombocytopenia, who presents for follow-up of heart failure. I last saw him in November. Unfortunately, since that time, Mr. Chenier has had a complicated hospitalization with urinary obstruction due to bladder cancer with urosepsis and volume overload. There was also concern for chronic infrapopliteal right lower extremity DVT. Today, Mr. Winker reports feeling quite well. He denies chest pain, shortness of breath, and orthopnea. He continues to have intermittent leg swelling particularly when he wakes up in the mornings. He is wearing compression stockings and is weighing himself daily. His wife notes that Mr. Laforte has gained about 3 pounds over the last few days despite being on furosemide 30 mg BID.  Mr. Edelman continues to follow closely with oncology and urology and may need further manipulation of his nephrostomy/nephroureteral tubes.  --------------------------------------------------------------------------------------------------  Cardiovascular History & Procedures: Cardiovascular Problems:  Coronary artery disease status post CABG (2002)  Ischemic cardiomyopathy  Syncope (2015 without recurrence)  Risk Factors:  Known CAD, carotid artery disease, hypertension, hyperlipidemia, age > 75  Cath/PCI:  LMCA 40% distal disease. LAD 40% proximal disease and 100% mid vessel occlusion. LCx with 40% OM1 disease and total occlusion of the mid vessel. RCA with mild diffuse disease and in-stent restenosis as well as chronic total occlusion of the distal  vessel. LIMA to LAD widely patent. SVG to OM 2 patent with mild luminal irregularities. RIMA to PDA atretic. LVEDP 15-20 mmHg.  CV Surgery:  CABG (2002, [place]): LIMA->LAD, SVG->OM2, and RIMA->rPDA  EP Procedures and Devices:  None  Non-Invasive Evaluation(s):  Carotid Doppler (09/02/17): Heterogeneous plaque bilaterally with 1-39% internal carotid artery stenosis.  TTE (09/01/17): Normal LV size and wall thickness. Apical akinesis noted with LVEF of 50%. Grade 2 diastolic dysfunction. Moderately calcified aortic valve without stenosis or regurgitation. Mild annular calcification. Moderate mitral regurgitation. Mild left atrial enlargement. Normal RV size and function. Normal PA pressure.  Exercise MPI (08/23/17): High risk study. Moderate in size, severe, fixed apical anterior, septal, and mid anterior walls consistent with scar. Large, severe, reversible basal and mid inferolateral/inferior defect consistent with ischemia. LVEF 30-44%.  Bilateral lower extremity arterial studies (03/07/15): Normal ABIs (1.2 on the right, 1.1 on the left).  Recent CV Pertinent Labs: Lab Results  Component Value Date   CHOL 122 09/21/2017   HDL 41 09/21/2017   LDLCALC 65 09/21/2017   LDLDIRECT 72 09/21/2017   TRIG 79 09/21/2017   CHOLHDL 3.0 09/21/2017   CHOLHDL 3 12/01/2016   INR 1.13 12/27/2017   BNP 1,472.0 (H) 01/31/2018   K 3.9 02/24/2018   K 4.4 12/29/2017   MG 2.0 02/24/2018   BUN 25 02/24/2018   BUN 31.5 (H) 12/29/2017   CREATININE 1.53 (H) 02/24/2018   CREATININE 1.66 (H) 02/15/2018   CREATININE 1.6 (H) 12/29/2017    Past medical and surgical history were reviewed and updated in EPIC.  Current Meds  Medication Sig  . acetaminophen (TYLENOL) 500 MG tablet Take 1,000 mg by mouth every 8 (eight) hours as needed for mild pain or moderate pain.  . carvedilol (COREG) 6.25 MG tablet Take 1 tablet (6.25 mg total) 2 (two) times daily  with a meal by mouth.  .  diphenhydramine-acetaminophen (TYLENOL PM EXTRA STRENGTH) 25-500 MG TABS tablet Take 1 tablet by mouth at bedtime as needed (pain,sleep).   . feeding supplement, ENSURE ENLIVE, (ENSURE ENLIVE) LIQD Take 237 mLs by mouth 2 (two) times daily between meals.  . furosemide (LASIX) 40 MG tablet Take 40 mg by mouth daily as needed for edema or shortness of breath.  . lidocaine-prilocaine (EMLA) cream Apply 1 application topically as needed.  . linaclotide (LINZESS) 290 MCG CAPS capsule TAKE 1 CAPSULE (290 MCG TOTAL) BY MOUTH DAILY. 30 MINUTES PRIOR TO A MEAL  . mirabegron ER (MYRBETRIQ) 25 MG TB24 tablet Take 1 tablet (25 mg total) by mouth daily.  . nitroGLYCERIN (NITROSTAT) 0.4 MG SL tablet Place 1 tablet (0.4 mg total) under the tongue every 5 (five) minutes as needed.  Marland Kitchen oxyCODONE (OXYCONTIN) 10 mg 12 hr tablet Take 1 tablet (10 mg total) by mouth 2 (two) times daily.  Marland Kitchen oxyCODONE-acetaminophen (PERCOCET) 10-325 MG tablet Take 1 tablet by mouth every 4 (four) hours as needed for pain.  . pramipexole (MIRAPEX) 1 MG tablet Take 1 mg by mouth daily.  . prochlorperazine (COMPAZINE) 10 MG tablet Take 1 tablet (10 mg total) by mouth every 6 (six) hours as needed for nausea or vomiting.  . ranitidine (ZANTAC) 300 MG tablet TAKE 1 TABLET BY MOUTH TWICE A DAY  . Tetrahydroz-Glyc-Hyprom-PEG (VISINE MAXIMUM REDNESS RELIEF OP) Place 2 drops into both eyes 2 (two) times daily as needed (dry eyes).   . traZODone (DESYREL) 50 MG tablet Take 1 tablet (50 mg total) by mouth at bedtime as needed for sleep.  . [DISCONTINUED] hydrALAZINE (APRESOLINE) 25 MG tablet Take 1 tablet (25 mg total) by mouth daily.    Allergies: Antihistamines, loratadine-type and Statins  Social History   Socioeconomic History  . Marital status: Married    Spouse name: Not on file  . Number of children: 5  . Years of education: Some coll  . Highest education level: Not on file  Social Needs  . Financial resource strain: Not on file  .  Food insecurity - worry: Not on file  . Food insecurity - inability: Not on file  . Transportation needs - medical: Not on file  . Transportation needs - non-medical: Not on file  Occupational History  . Occupation: retired  Tobacco Use  . Smoking status: Former Smoker    Types: Cigarettes    Last attempt to quit: 12/28/1978    Years since quitting: 39.1  . Smokeless tobacco: Never Used  Substance and Sexual Activity  . Alcohol use: Yes    Alcohol/week: 1.2 oz    Types: 1 Glasses of wine, 1 Cans of beer per week    Comment: occassional  . Drug use: No  . Sexual activity: Not on file  Other Topics Concern  . Not on file  Social History Narrative   Lives at home w/ his wife   Right-handed   5 cups of coffee per day    Family History  Problem Relation Age of Onset  . Heart attack Mother   . Aneurysm Father        femoral artery  . Heart disease Brother   . Heart disease Maternal Uncle        x 2  . Colon cancer Neg Hx   . Esophageal cancer Neg Hx   . Pancreatic cancer Neg Hx   . Kidney disease Neg Hx   . Liver disease  Neg Hx     Review of Systems: A 12-system review of systems was performed and was negative except as noted in the HPI.Marland Kitchen  --------------------------------------------------------------------------------------------------  Physical Exam: BP (!) 100/50   Pulse 86   Ht 5\' 8"  (1.727 m)   Wt 179 lb 3.2 oz (81.3 kg)   SpO2 96%   BMI 27.25 kg/m   General:  Elderly man, seated comfortably on the exam table. He is accompanied by his wife. HEENT: No conjunctival pallor or scleral icterus.  Moist mucous membranes. Neck: Supple without lymphadenopathy, thyromegaly, JVD, or HJR.   Lungs: Normal work of breathing.  Clear to auscultation bilaterally without wheezes or crackles. Heart: RRR with occasional extrasystoles. No murmurs or rubs. Abd: Bowel sounds present.  Soft, NT/ND without hepatosplenomegaly Ext: Compression stockings in place with 1-2+ ankle edema  bilaterally   Lab Results  Component Value Date   WBC 7.4 02/16/2018   HGB 8.3 (L) 02/16/2018   HCT 25.4 (L) 02/16/2018   MCV 96.2 02/16/2018   PLT 158 02/16/2018    Lab Results  Component Value Date   NA 138 02/24/2018   K 3.9 02/24/2018   CL 100 02/24/2018   CO2 22 02/24/2018   BUN 25 02/24/2018   CREATININE 1.53 (H) 02/24/2018   GLUCOSE 86 02/24/2018   ALT 10 02/15/2018    Lab Results  Component Value Date   CHOL 122 09/21/2017   HDL 41 09/21/2017   LDLCALC 65 09/21/2017   LDLDIRECT 72 09/21/2017   TRIG 79 09/21/2017   CHOLHDL 3.0 09/21/2017    --------------------------------------------------------------------------------------------------  ASSESSMENT AND PLAN: Chronic systolic heart failure Mr. Groeneveld notes continued edema at home and 3 pound weight gain over the last few days. His weight in the office today is stable with measurements over the last few months. We will check a BMP today. I will have Mr. Lansdowne increase furosemide to 80 mg QAM and 40 mg QPM x 3 days, after which he should return to 40 mg BID. He should continue to monitor his weight and urine output closely. Sodium restriction was reiterated.  Coronary artery disease without angina No symptoms to suggest worsening coronary insufficiency. Of note, catheterizaiton in 07/2018 showed severe native CAD with occluded SVG->PDA. We will continue with medical therapy.  RLE DVT Chronic infrapopliteal DVT noted last month. This certainly could be contributing to some of Mr. Reddinger's leg edema. Given his active malignancy and recent thrombocytopenia and continued anemia, I will defer anticoagulation and DVT management to his oncologist, Dr. [name].  PVCs Noted on exam and EKG today. Mr. Rothbauer is asymptomatic. I will recheck a BMP and Mg level today.  Chronic kidney disease I will repeat a BMP today, with planned escalation of furosemide. He should have a BMP repeated when returns for follow-up in ~2  weeks.  Bladder cancer Per oncology and urology.  Follow-up: Return to clinic with APP in 2 weeks.  [name], MD 02/25/2018 5:39 PM

## 2018-02-24 NOTE — Patient Instructions (Addendum)
Medication Instructions:   Stop hydralazine    LASIX:  80 mg in morning               40 mg at night  If weight gain, take 40 mg day/night  -- If you need a refill on your cardiac medications before your next appointment, please call your pharmacy. --  Labwork: None ordered  Testing/Procedures: None ordered  Follow-Up: Your physician wants you to follow-up in: 2 weeks with Dr. APP    Thank you for choosing [facility]!!    Any Other Special Instructions Will Be Listed Below (If Applicable).

## 2018-02-25 ENCOUNTER — Other Ambulatory Visit: Payer: Self-pay | Admitting: Oncology

## 2018-02-25 ENCOUNTER — Ambulatory Visit: Payer: Medicare HMO | Admitting: Internal Medicine

## 2018-02-25 ENCOUNTER — Encounter: Payer: Self-pay | Admitting: Oncology

## 2018-02-25 ENCOUNTER — Other Ambulatory Visit (HOSPITAL_COMMUNITY): Payer: Self-pay | Admitting: Interventional Radiology

## 2018-02-25 ENCOUNTER — Encounter (HOSPITAL_COMMUNITY): Payer: Self-pay | Admitting: Interventional Radiology

## 2018-02-25 ENCOUNTER — Ambulatory Visit (HOSPITAL_COMMUNITY)
Admission: RE | Admit: 2018-02-25 | Discharge: 2018-02-25 | Disposition: A | Discharge status: Home / Self Care | Payer: Medicare HMO | Source: Ambulatory Visit | Attending: Oncology | Admitting: Oncology

## 2018-02-25 DIAGNOSIS — C679 Malignant neoplasm of bladder, unspecified: Secondary | ICD-10-CM

## 2018-02-25 DIAGNOSIS — T83022A Displacement of nephrostomy catheter, initial encounter: Secondary | ICD-10-CM | POA: Diagnosis not present

## 2018-02-25 DIAGNOSIS — Y828 Other medical devices associated with adverse incidents: Secondary | ICD-10-CM | POA: Diagnosis not present

## 2018-02-25 DIAGNOSIS — I493 Ventricular premature depolarization: Secondary | ICD-10-CM | POA: Insufficient documentation

## 2018-02-25 HISTORY — PX: IR NEPHROSTOMY EXCHANGE LEFT: IMG6069

## 2018-02-25 LAB — BASIC METABOLIC PANEL
BUN/Creatinine Ratio: 16 (ref 10–24)
BUN: 25 mg/dL (ref 8–27)
CO2: 22 mmol/L (ref 20–29)
CREATININE: 1.53 mg/dL — AB (ref 0.76–1.27)
Calcium: 9.6 mg/dL (ref 8.6–10.2)
Chloride: 100 mmol/L (ref 96–106)
GFR calc non Af Amer: 42 mL/min/{1.73_m2} — ABNORMAL LOW (ref >59)
GFR, EST AFRICAN AMERICAN: 49 mL/min/{1.73_m2} — AB (ref >59)
Glucose: 86 mg/dL (ref 65–99)
Potassium: 3.9 mmol/L (ref 3.5–5.2)
Sodium: 138 mmol/L (ref 134–144)

## 2018-02-25 LAB — MAGNESIUM: Magnesium: 2 mg/dL (ref 1.6–2.3)

## 2018-02-25 MED ORDER — LIDOCAINE HCL 1 % IJ SOLN
INTRAMUSCULAR | Status: DC | PRN
  Administered 2018-02-25: 5 mL

## 2018-02-25 MED ORDER — LIDOCAINE HCL 1 % IJ SOLN
INTRAMUSCULAR | Status: AC
  Filled 2018-02-25: qty 20

## 2018-02-25 MED ORDER — IOPAMIDOL (ISOVUE-300) INJECTION 61%
INTRAVENOUS | Status: AC
  Administered 2018-02-25: 10 mL
  Filled 2018-02-25: qty 50

## 2018-02-28 ENCOUNTER — Telehealth: Payer: Self-pay

## 2018-02-28 NOTE — Telephone Encounter (Signed)
Pt wife, [name], sent MyChart message wondering about scan to be scheduled and if she should expect a call. Per Dr. Hazeline Junker last LOS, expected CT C/A/P on 3/15 with lab and flush prior. Appt scheduled and pt wife, [name], called. Request for appointments to be changed to Thursday (3/14). Lab, flush, and CT appt changed to 3/14 as requested. Pt wife called back with appointments times and instruction for pt remain NPO after 7am on 3/14 and to drink one bottle of contrast at 9am and the second at 10am. Pt wife verbalized understanding.

## 2018-03-01 DIAGNOSIS — C679 Malignant neoplasm of bladder, unspecified: Secondary | ICD-10-CM | POA: Diagnosis not present

## 2018-03-01 DIAGNOSIS — I130 Hypertensive heart and chronic kidney disease with heart failure and stage 1 through stage 4 chronic kidney disease, or unspecified chronic kidney disease: Secondary | ICD-10-CM | POA: Diagnosis not present

## 2018-03-01 DIAGNOSIS — N390 Urinary tract infection, site not specified: Secondary | ICD-10-CM | POA: Diagnosis not present

## 2018-03-01 DIAGNOSIS — I2510 Atherosclerotic heart disease of native coronary artery without angina pectoris: Secondary | ICD-10-CM | POA: Diagnosis not present

## 2018-03-01 DIAGNOSIS — Z433 Encounter for attention to colostomy: Secondary | ICD-10-CM | POA: Diagnosis not present

## 2018-03-01 DIAGNOSIS — B9562 Methicillin resistant Staphylococcus aureus infection as the cause of diseases classified elsewhere: Secondary | ICD-10-CM | POA: Diagnosis not present

## 2018-03-01 DIAGNOSIS — D631 Anemia in chronic kidney disease: Secondary | ICD-10-CM | POA: Diagnosis not present

## 2018-03-01 DIAGNOSIS — Z466 Encounter for fitting and adjustment of urinary device: Secondary | ICD-10-CM | POA: Diagnosis not present

## 2018-03-01 DIAGNOSIS — I5023 Acute on chronic systolic (congestive) heart failure: Secondary | ICD-10-CM | POA: Diagnosis not present

## 2018-03-01 DIAGNOSIS — Z436 Encounter for attention to other artificial openings of urinary tract: Secondary | ICD-10-CM | POA: Diagnosis not present

## 2018-03-03 ENCOUNTER — Ambulatory Visit: Payer: Medicare HMO | Admitting: Oncology

## 2018-03-03 ENCOUNTER — Ambulatory Visit: Payer: Medicare HMO

## 2018-03-03 ENCOUNTER — Ambulatory Visit: Payer: Medicare HMO | Admitting: Internal Medicine

## 2018-03-03 ENCOUNTER — Other Ambulatory Visit: Payer: Medicare HMO

## 2018-03-04 ENCOUNTER — Ambulatory Visit: Payer: Medicare HMO

## 2018-03-04 ENCOUNTER — Other Ambulatory Visit: Payer: Self-pay | Admitting: Oncology

## 2018-03-04 DIAGNOSIS — C679 Malignant neoplasm of bladder, unspecified: Secondary | ICD-10-CM

## 2018-03-05 ENCOUNTER — Other Ambulatory Visit: Payer: Self-pay | Admitting: Internal Medicine

## 2018-03-09 DIAGNOSIS — N390 Urinary tract infection, site not specified: Secondary | ICD-10-CM | POA: Diagnosis not present

## 2018-03-09 DIAGNOSIS — N130 Hydronephrosis with ureteropelvic junction obstruction: Secondary | ICD-10-CM | POA: Diagnosis not present

## 2018-03-09 DIAGNOSIS — C670 Malignant neoplasm of trigone of bladder: Secondary | ICD-10-CM | POA: Diagnosis not present

## 2018-03-09 DIAGNOSIS — Z466 Encounter for fitting and adjustment of urinary device: Secondary | ICD-10-CM | POA: Diagnosis not present

## 2018-03-09 DIAGNOSIS — D631 Anemia in chronic kidney disease: Secondary | ICD-10-CM | POA: Diagnosis not present

## 2018-03-09 DIAGNOSIS — I130 Hypertensive heart and chronic kidney disease with heart failure and stage 1 through stage 4 chronic kidney disease, or unspecified chronic kidney disease: Secondary | ICD-10-CM | POA: Diagnosis not present

## 2018-03-09 DIAGNOSIS — B9562 Methicillin resistant Staphylococcus aureus infection as the cause of diseases classified elsewhere: Secondary | ICD-10-CM | POA: Diagnosis not present

## 2018-03-09 DIAGNOSIS — Z433 Encounter for attention to colostomy: Secondary | ICD-10-CM | POA: Diagnosis not present

## 2018-03-09 DIAGNOSIS — R3914 Feeling of incomplete bladder emptying: Secondary | ICD-10-CM | POA: Diagnosis not present

## 2018-03-09 DIAGNOSIS — I2510 Atherosclerotic heart disease of native coronary artery without angina pectoris: Secondary | ICD-10-CM | POA: Diagnosis not present

## 2018-03-09 DIAGNOSIS — Z436 Encounter for attention to other artificial openings of urinary tract: Secondary | ICD-10-CM | POA: Diagnosis not present

## 2018-03-09 DIAGNOSIS — I5023 Acute on chronic systolic (congestive) heart failure: Secondary | ICD-10-CM | POA: Diagnosis not present

## 2018-03-09 DIAGNOSIS — C679 Malignant neoplasm of bladder, unspecified: Secondary | ICD-10-CM | POA: Diagnosis not present

## 2018-03-10 ENCOUNTER — Ambulatory Visit (HOSPITAL_COMMUNITY)
Admission: RE | Admit: 2018-03-10 | Discharge: 2018-03-10 | Disposition: A | Discharge status: Home / Self Care | Payer: Medicare HMO | Source: Ambulatory Visit | Attending: Oncology | Admitting: Oncology

## 2018-03-10 ENCOUNTER — Encounter: Payer: Self-pay | Admitting: *Deleted

## 2018-03-10 ENCOUNTER — Inpatient Hospital Stay: Payer: Medicare HMO | Attending: Oncology

## 2018-03-10 ENCOUNTER — Inpatient Hospital Stay: Payer: Medicare HMO

## 2018-03-10 DIAGNOSIS — Z5112 Encounter for antineoplastic immunotherapy: Secondary | ICD-10-CM | POA: Diagnosis present

## 2018-03-10 DIAGNOSIS — N1330 Unspecified hydronephrosis: Secondary | ICD-10-CM | POA: Diagnosis not present

## 2018-03-10 DIAGNOSIS — I700 Atherosclerosis of aorta: Secondary | ICD-10-CM | POA: Insufficient documentation

## 2018-03-10 DIAGNOSIS — I2510 Atherosclerotic heart disease of native coronary artery without angina pectoris: Secondary | ICD-10-CM | POA: Diagnosis not present

## 2018-03-10 DIAGNOSIS — D649 Anemia, unspecified: Secondary | ICD-10-CM

## 2018-03-10 DIAGNOSIS — G893 Neoplasm related pain (acute) (chronic): Secondary | ICD-10-CM | POA: Insufficient documentation

## 2018-03-10 DIAGNOSIS — D696 Thrombocytopenia, unspecified: Secondary | ICD-10-CM | POA: Insufficient documentation

## 2018-03-10 DIAGNOSIS — Z936 Other artificial openings of urinary tract status: Secondary | ICD-10-CM | POA: Diagnosis not present

## 2018-03-10 DIAGNOSIS — I82409 Acute embolism and thrombosis of unspecified deep veins of unspecified lower extremity: Secondary | ICD-10-CM | POA: Diagnosis not present

## 2018-03-10 DIAGNOSIS — R918 Other nonspecific abnormal finding of lung field: Secondary | ICD-10-CM | POA: Insufficient documentation

## 2018-03-10 DIAGNOSIS — C679 Malignant neoplasm of bladder, unspecified: Secondary | ICD-10-CM | POA: Diagnosis not present

## 2018-03-10 DIAGNOSIS — I509 Heart failure, unspecified: Secondary | ICD-10-CM | POA: Diagnosis not present

## 2018-03-10 LAB — CBC WITH DIFFERENTIAL (CANCER CENTER ONLY)
Basophils Absolute: 0.1 10*3/uL (ref 0.0–0.1)
Basophils Relative: 2 %
EOS PCT: 27 %
Eosinophils Absolute: 1.6 10*3/uL — ABNORMAL HIGH (ref 0.0–0.5)
HEMATOCRIT: 28.3 % — AB (ref 38.4–49.9)
Hemoglobin: 9.5 g/dL — ABNORMAL LOW (ref 13.0–17.1)
LYMPHS ABS: 1.3 10*3/uL (ref 0.9–3.3)
Lymphocytes Relative: 23 %
MCH: 32.2 pg (ref 27.2–33.4)
MCHC: 33.7 g/dL (ref 32.0–36.0)
MCV: 95.6 fL (ref 79.3–98.0)
MONOS PCT: 10 %
Monocytes Absolute: 0.6 10*3/uL (ref 0.1–0.9)
NEUTROS ABS: 2.2 10*3/uL (ref 1.5–6.5)
Neutrophils Relative %: 38 %
PLATELETS: 102 10*3/uL — AB (ref 140–400)
RBC: 2.96 MIL/uL — ABNORMAL LOW (ref 4.20–5.82)
RDW: 20 % — AB (ref 11.0–14.6)
WBC Count: 5.9 10*3/uL (ref 4.0–10.3)

## 2018-03-10 LAB — CMP (CANCER CENTER ONLY)
ALT: 10 U/L (ref 0–55)
AST: 17 U/L (ref 5–34)
Albumin: 3.7 g/dL (ref 3.5–5.0)
Alkaline Phosphatase: 64 U/L (ref 40–150)
Anion gap: 9 (ref 3–11)
BILIRUBIN TOTAL: 0.4 mg/dL (ref 0.2–1.2)
BUN: 27 mg/dL — AB (ref 7–26)
CHLORIDE: 101 mmol/L (ref 98–109)
CO2: 27 mmol/L (ref 22–29)
Calcium: 9.8 mg/dL (ref 8.4–10.4)
Creatinine: 1.76 mg/dL — ABNORMAL HIGH (ref 0.70–1.30)
GFR, EST AFRICAN AMERICAN: 40 mL/min — AB (ref >60)
GFR, EST NON AFRICAN AMERICAN: 35 mL/min — AB (ref >60)
Glucose, Bld: 88 mg/dL (ref 70–140)
POTASSIUM: 3.7 mmol/L (ref 3.5–5.1)
Sodium: 137 mmol/L (ref 136–145)
TOTAL PROTEIN: 7.3 g/dL (ref 6.4–8.3)

## 2018-03-10 LAB — SAMPLE TO BLOOD BANK

## 2018-03-10 MED ORDER — SODIUM CHLORIDE 0.9% FLUSH
10.0000 mL | Freq: Once | INTRAVENOUS | Status: AC
  Administered 2018-03-10: 10 mL via INTRAVENOUS
  Filled 2018-03-10: qty 10

## 2018-03-10 NOTE — Patient Instructions (Signed)

## 2018-03-11 ENCOUNTER — Ambulatory Visit (HOSPITAL_COMMUNITY): Payer: Medicare HMO

## 2018-03-11 ENCOUNTER — Telehealth: Payer: Self-pay | Admitting: *Deleted

## 2018-03-11 ENCOUNTER — Other Ambulatory Visit: Payer: Medicare HMO

## 2018-03-11 NOTE — Telephone Encounter (Signed)
Call from pt's wife reporting his port is still accessed since having CT on 3/14. Requested appointment for this afternoon for de-access. Appt given.

## 2018-03-14 ENCOUNTER — Encounter: Payer: Self-pay | Admitting: Oncology

## 2018-03-14 ENCOUNTER — Telehealth: Payer: Self-pay | Admitting: Oncology

## 2018-03-14 ENCOUNTER — Inpatient Hospital Stay: Payer: Medicare HMO | Admitting: Oncology

## 2018-03-14 VITALS — BP 98/54 | HR 64 | Temp 98.1°F | Resp 17 | Wt 176.2 lb

## 2018-03-14 DIAGNOSIS — Z936 Other artificial openings of urinary tract status: Secondary | ICD-10-CM | POA: Diagnosis not present

## 2018-03-14 DIAGNOSIS — N1330 Unspecified hydronephrosis: Secondary | ICD-10-CM | POA: Diagnosis not present

## 2018-03-14 DIAGNOSIS — Z7189 Other specified counseling: Secondary | ICD-10-CM

## 2018-03-14 DIAGNOSIS — Z5112 Encounter for antineoplastic immunotherapy: Secondary | ICD-10-CM | POA: Diagnosis not present

## 2018-03-14 DIAGNOSIS — D649 Anemia, unspecified: Secondary | ICD-10-CM | POA: Diagnosis not present

## 2018-03-14 DIAGNOSIS — I509 Heart failure, unspecified: Secondary | ICD-10-CM

## 2018-03-14 DIAGNOSIS — C785 Secondary malignant neoplasm of large intestine and rectum: Secondary | ICD-10-CM

## 2018-03-14 DIAGNOSIS — D696 Thrombocytopenia, unspecified: Secondary | ICD-10-CM | POA: Diagnosis not present

## 2018-03-14 DIAGNOSIS — C679 Malignant neoplasm of bladder, unspecified: Secondary | ICD-10-CM

## 2018-03-14 DIAGNOSIS — C786 Secondary malignant neoplasm of retroperitoneum and peritoneum: Secondary | ICD-10-CM

## 2018-03-14 DIAGNOSIS — I82409 Acute embolism and thrombosis of unspecified deep veins of unspecified lower extremity: Secondary | ICD-10-CM

## 2018-03-14 DIAGNOSIS — G893 Neoplasm related pain (acute) (chronic): Secondary | ICD-10-CM

## 2018-03-14 DIAGNOSIS — C7951 Secondary malignant neoplasm of bone: Secondary | ICD-10-CM

## 2018-03-14 MED ORDER — AMOXICILLIN 500 MG PO CAPS: 500.0000 mg | ORAL_CAPSULE | Freq: Three times a day (TID) | ORAL | 0 refills | Status: DC

## 2018-03-14 MED ORDER — OXYCODONE-ACETAMINOPHEN 10-325 MG PO TABS: 1.0000 | ORAL_TABLET | ORAL | 0 refills | Status: DC | PRN

## 2018-03-14 NOTE — Progress Notes (Signed)
DISCONTINUE ON PATHWAY REGIMEN - Bladder     A cycle is every 21 days:     Carboplatin      Gemcitabine   **Always confirm dose/schedule in your pharmacy ordering system**    REASON: Toxicities / Adverse Event PRIOR TREATMENT: [id number]: Carboplatin AUC=5 D1 + Gemcitabine 1,000 mg/m2 D1, 8 q21 Days for a Maximum of 6 Cycles TREATMENT RESPONSE: Partial Response (PR)  START ON PATHWAY REGIMEN - Bladder     A cycle is 21 days:     Pembrolizumab   **Always confirm dose/schedule in your pharmacy ordering system**    Patient Characteristics: Metastatic Disease, Second Line AJCC M Category: Staged < 8th Ed. AJCC N Category: Staged < 8th Ed. AJCC T Category: Staged < 8th Ed. Current evidence of distant metastases<= Yes AJCC 8 Stage Grouping: Staged < 8th Ed. Line of Therapy: Second Line Would you be surprised if this patient died  in the next year<= I would be surprised if this patient died in the next year Intent of Therapy: Non-Curative / Palliative Intent, Discussed with Patient

## 2018-03-14 NOTE — Telephone Encounter (Signed)
Appointments scheduled AVS/Calendar printed per 3/18 los °

## 2018-03-14 NOTE — Progress Notes (Signed)
Hematology and Oncology Follow Up Visit  Charles Coffey [id number] Oct 22, 1935 84 y.o. 03/14/2018 8:28 AM Charles Coffey, MDFry, Charles Holter, MD   Principle Diagnosis: 83 year old man with stage IV urothelial carcinoma involving the upper left genitourinary tract and bladder.  He has disease involving retroperitoneal adenopathy as well as invasion into the rectum diagnosed in November 2018.  His tumor is PDL 1 testing is positive with 25% CPS     Prior Therapy:  He is status post cystoscopy and transurethral resection of a bladder tumor and on 11/25/2017.  He is status post bilateral nephrostomy tube placement in November 2018.  He is status post diverting colostomy done on 12/01/2017 performed by Dr. Barry Coffey due to colonic obstruction.  Chemotherapy utilizing gemcitabine and carboplatin.  Day 1 of cycle 1 was given on December 30, 2017.  He completed 2 cycles of therapy.  Therapy discontinued based on his wishes to poor tolerance.  Current therapy: Supportive care only with consideration of additional therapy.  Interim History: Charles Coffey is here for a follow-up accompanied by his family.  Since the last visit, he reported improvement in his overall health.  His pain is improved and currently taking Percocet only without any long-acting pain medication.  His Percocet need has been at most 3 times a day.  His pain is predominantly around the nephrostomy tubes.  He continues to have pelvic spasms that has also improved.  He denies any recent hospitalizations or illnesses.  His performance status and quality of life has improved.  He denies any excessive bleeding but does report lower extremity edema at times.   He does not report any headaches, blurry vision, syncope or seizures.  No new neurological deficits.  He does not report any fevers, chills, sweats. He does not report any palpitation, orthopnea. He does not report any cough, wheezing or hemoptysis. He does not report any nausea, vomiting or  abdominal pain. He does not report any frequency urgency or hesitancy. He does not report any skeletal complaints rales or myalgias.  Mood appears stable without anxiety or depression.  He does not report any skin rashes or lesions.  He denied any petechiae. He does not report any lymphadenopathy.  He does not report any easy bruisability.  Remaining review of systems is negative.  Medications: I have reviewed the patient's medication and discussed with the patient today.  Unchanged today. Current Outpatient Medications  Medication Sig Dispense Refill  . acetaminophen (TYLENOL) 500 MG tablet Take 1,000 mg by mouth every 8 (eight) hours as needed for mild pain or moderate pain.    . carvedilol (COREG) 6.25 MG tablet Take 1 tablet (6.25 mg total) 2 (two) times daily with a meal by mouth. 180 tablet 3  . dicyclomine (BENTYL) 10 MG capsule TAKE 1-2 BY MOUTH EVERY 6 HOURS AS NEEDED FOR RECTAL SPASMS. 90 capsule 2  . diphenhydramine-acetaminophen (TYLENOL PM EXTRA STRENGTH) 25-500 MG TABS tablet Take 1 tablet by mouth at bedtime as needed (pain,sleep).     . feeding supplement, ENSURE ENLIVE, (ENSURE ENLIVE) LIQD Take 237 mLs by mouth 2 (two) times daily between meals. 60 Bottle 0  . furosemide (LASIX) 40 MG tablet Take 40 mg by mouth daily as needed for edema or shortness of breath.  0  . lidocaine-prilocaine (EMLA) cream Apply 1 application topically as needed. 30 g 0  . linaclotide (LINZESS) 290 MCG CAPS capsule TAKE 1 CAPSULE (290 MCG TOTAL) BY MOUTH DAILY. 30 MINUTES PRIOR TO A MEAL 30 capsule  10  . mirabegron ER (MYRBETRIQ) 25 MG TB24 tablet Take 1 tablet (25 mg total) by mouth daily. 30 tablet 0  . nitroGLYCERIN (NITROSTAT) 0.4 MG SL tablet Place 1 tablet (0.4 mg total) under the tongue every 5 (five) minutes as needed. 25 tablet 3  . oxyCODONE (OXYCONTIN) 10 mg 12 hr tablet Take 1 tablet (10 mg total) by mouth 2 (two) times daily. 60 tablet 0  . oxyCODONE-acetaminophen (PERCOCET) 10-325 MG tablet  Take 1 tablet by mouth every 4 (four) hours as needed for pain. 60 tablet 0  . pramipexole (MIRAPEX) 1 MG tablet Take 1 mg by mouth daily.    . prochlorperazine (COMPAZINE) 10 MG tablet Take 1 tablet (10 mg total) by mouth every 6 (six) hours as needed for nausea or vomiting. 30 tablet 0  . ranitidine (ZANTAC) 300 MG tablet TAKE 1 TABLET BY MOUTH TWICE A DAY 60 tablet 3  . Tetrahydroz-Glyc-Hyprom-PEG (VISINE MAXIMUM REDNESS RELIEF OP) Place 2 drops into both eyes 2 (two) times daily as needed (dry eyes).     . traZODone (DESYREL) 50 MG tablet Take 1 tablet (50 mg total) by mouth at bedtime as needed for sleep. 30 tablet 2   No current facility-administered medications for this visit.      Allergies:  Allergies  Allergen Reactions  . Antihistamines, Loratadine-Type Other (See Comments)    Unable to urinate  . Statins Other (See Comments)    liver effects    Past Medical History, Surgical history, Social history, and Family History reviewed today and remain unchanged.   Physical Exam: Blood pressure (!) 98/54, pulse 64, temperature 98.1 F (36.7 C), temperature source Oral, resp. rate 17, weight 176 lb 3.2 oz (79.9 kg), SpO2 98 %.   ECOG: 1 General appearance: Alert awake without distress. Head: Without abnormalities noted. Oral mucosa: No oral thrush or ulcers. Eyes: Pupils are equal and round reactive to light. Lymph nodes: Cervical, supraclavicular, and axillary nodes normal. Heart: Regular rate and rhythm, without any murmurs rubs or gallops. Lung: Clear  to auscultation without any rhonchi, wheezes or dullness to percussion.   Abdomin: Soft, nontender without any rebound or guarding. Musculoskeletal: No joint deformity or effusion. Skin: No ecchymosis or rash. Psychiatric: Appropriate mood and affect. Neurological: No motor, sensory deficits.  Intact deep tendon reflexes.  Lab Results: Lab Results  Component Value Date   WBC 5.9 03/10/2018   HGB 8.3 (L) 02/16/2018    HCT 28.3 (L) 03/10/2018   MCV 95.6 03/10/2018   PLT 102 (L) 03/10/2018     Chemistry      Component Value Date/Time   NA 137 03/10/2018 0853   NA 138 02/24/2018 1646   NA 133 (L) 12/29/2017 1014   K 3.7 03/10/2018 0853   K 4.4 12/29/2017 1014   CL 101 03/10/2018 0853   CO2 27 03/10/2018 0853   CO2 20 (L) 12/29/2017 1014   BUN 27 (H) 03/10/2018 0853   BUN 25 02/24/2018 1646   BUN 31.5 (H) 12/29/2017 1014   CREATININE 1.76 (H) 03/10/2018 0853   CREATININE 1.6 (H) 12/29/2017 1014      Component Value Date/Time   CALCIUM 9.8 03/10/2018 0853   CALCIUM 9.6 12/29/2017 1014   ALKPHOS 64 03/10/2018 0853   ALKPHOS 61 12/29/2017 1014   AST 17 03/10/2018 0853   AST 15 12/29/2017 1014   ALT 10 03/10/2018 0853   ALT 13 12/29/2017 1014   BILITOT 0.4 03/10/2018 0853   BILITOT 0.43 12/29/2017 1014  EXAM: CT CHEST, ABDOMEN AND PELVIS WITHOUT CONTRAST  TECHNIQUE: Multidetector CT imaging of the chest, abdomen and pelvis was performed following the standard protocol without IV contrast.  COMPARISON:  CT AP 01/01/2018.  FINDINGS: CT CHEST FINDINGS  Cardiovascular: The heart size appears normal. Aortic atherosclerosis. Calcification within the LAD, left circumflex and RCA coronary arteries noted. No pericardial effusion.  Mediastinum/Nodes: Normal appearance of the thyroid gland. The trachea appears patent and is midline. Normal appearance of the esophagus. No enlarged mediastinal or hilar lymph nodes.  Lungs/Pleura: No pleural effusion. Scattered ground-glass and tree-in-bud nodules within the upper lung zones are noted, likely post infectious/inflammatory. A few scattered solid nodules are noted in both lungs. Index nodule within the left upper lobe measures 2 mm, image 39/6. Anterior right upper lobe lung nodule measures 2 mm, image 58/series 6. Nonspecific right middle lobe lung nodule measures 4 mm. Also in the right middle lobe is a 4 mm lung nodule, image 89/6.  3 mm right middle lobe lung nodule noted. In the posterior left lung base there is a 5 mm lung nodule, image 136/6.  Musculoskeletal: There is mild scoliosis and degenerative disc disease identified no suspicious bone lesions.  CT ABDOMEN PELVIS FINDINGS  Hepatobiliary: No focal liver abnormality is seen. No gallstones, gallbladder wall thickening, or biliary dilatation.  Pancreas: Unremarkable. No pancreatic ductal dilatation or surrounding inflammatory changes.  Spleen: Normal in size without focal abnormality.  Adrenals/Urinary Tract: The adrenal glands appear normal. Bilateral percutaneous nephrostomy tubes are identified. A right-sided nephroureteral stent is also in place. No hydronephrosis. Asymmetric atrophy of the left kidney compared with the right. Soft tissue nodularity along the course of the left ureter is again noted. Similar to previous exam. Also similar is irregular nodular thickening of Gerota's fascia on the left. There is asymmetric soft tissue thickening involving the left posterior bladder wall and distal left ureter. This is difficult to assess without IV contrast material and with a partially decompressed urinary bladder. This measures approximately 5.0 x 2.9 cm, image 107/2. Previously 4.7 x 4.4 cm. Mild diffuse bladder wall thickening. Similar to previous exam.  Stomach/Bowel: The stomach appears normal. The small bowel loops have a normal course and caliber without obstruction. Left abdominal wall double barrel colostomy identified. No pathologic dilatation of the large bowel. Abnormal wall thickening involving the rectum is again identified, image 112 of series 2. Surrounding increased soft tissue is noted which is stable.  Vascular/Lymphatic: Aortic atherosclerosis noted. No aneurysm. Similar appearance of mild nodularity between the abdominal aorta and left ureter. No discrete measurable adenopathy within the abdomen or  pelvis.  Reproductive: Stable prostate gland enlargement.  Other: No free fluid or fluid collections.  Musculoskeletal: No suspicious bone lesions. Scoliosis. Previous posterior hardware fixation of the lower lumbar spine.  IMPRESSION: 1. Similar appearance of abnormal asymmetric increased soft tissue within the left posterior bladder base encasing the distal left ureter. 2. Stable appearance of soft tissue nodularity along the course of the left ureter and in between the left kidney and left side of aorta. Nodularity involving the Gerota's fascia on the left is also similar. 3. Stable appearance of small pulmonary nodules. 4. Status post bilateral percutaneous nephrostomy tube placement with decompression of previous hydronephrosis. 5. Stable soft tissue thickening of the rectum. 6. Aortic Atherosclerosis (ICD10-I70.0). Coronary artery atherosclerotic calcifications noted.     Impression and Plan:   83 year old gentleman with the following  1.  High-grade urothelial carcinoma arising from the upper left genitourinary tract and  the bladder with stage IV disease.  He presented with locally advanced disease infiltrating the bladder into the rectum as well as retroperitoneal adenopathy.  His tumor is PDL 1 positive with 25% CPS.  He received 2 cycles of chemotherapy utilizing gemcitabine and carboplatin and therapy discontinued because of poor tolerance.  CT scan of the chest abdomen and pelvis obtained on 03/10/2018 were personally reviewed and showed no widespread metastatic disease noted with locally advanced disease still possible.  Options of therapy were reviewed today which include resumption of systemic chemotherapy, using immunotherapy as a salvage option versus continued best supportive care.  Risks and benefits of all these approaches as well as the rationale was discussed today in detail.  After discussion today, he is interested in and he is interested in  immunotherapy.  Complications associated with Pembrolizumab was discussed today.  These were include fatigue, arthritis, pneumonitis, colitis, rash, and others.  The benefit would be palliation of his disease given his high PDL 1 expression.  He is ready to proceed with infusion of 200 mg every 3 weeks.   2.  Bilateral hydronephrosis: Creatinine remained stable with nephrostomy tube in place.  Erythema noted around the nephrostomy tube in another course of amoxicillin will be made available to him.  3.  Thrombocytopenia:  platelet count improved without chemotherapy.  4.  IV access: Port-A-Cath will be flushed periodically when not in use.  5.  Pain: Related to his cancer and nephrostomy tubes.  Percocet was refilled for him today.  6.  Congestive heart failure: Mild edema noted on his lower extremities continues to be on Lasix.  7.  Deep vein thrombosis: He has limited lower extremity deep vein thrombosis and off anticoagulation because of risk of bleeding.  8.  Anemia: Hemoglobin appears to be stable off chemotherapy.  9.  Prognosis and goals of care: He has an incurable malignancy but reasonable performance status that would like to continue aggressive therapy at this time.  10.  Follow-up: In the next week or so to start salvage therapy. MD follow-up in 4 weeks.  25 minutes was spent today with the patient face-to-face.  More than 50% was spent today on education, counseling and coordination of his care.  Zola Button, MD 3/18/20198:28 AM

## 2018-03-15 ENCOUNTER — Encounter: Payer: Self-pay | Admitting: Family Medicine

## 2018-03-15 ENCOUNTER — Ambulatory Visit: Payer: Medicare HMO | Admitting: Physician Assistant

## 2018-03-17 NOTE — Telephone Encounter (Signed)
He should see Dr. Barry Dienes about this

## 2018-03-18 DIAGNOSIS — Z933 Colostomy status: Secondary | ICD-10-CM | POA: Diagnosis not present

## 2018-03-18 DIAGNOSIS — K624 Stenosis of anus and rectum: Secondary | ICD-10-CM | POA: Diagnosis not present

## 2018-03-20 ENCOUNTER — Encounter: Payer: Self-pay | Admitting: Oncology

## 2018-03-21 ENCOUNTER — Telehealth: Payer: Self-pay | Admitting: *Deleted

## 2018-03-21 NOTE — Telephone Encounter (Signed)
Patient's wife calling  to ask if they should eat or take am medications before [place] on wed. Okay to take am meds and eat breakfast. Wear layers d/t the infusion room can be cool at times.

## 2018-03-22 DIAGNOSIS — Z433 Encounter for attention to colostomy: Secondary | ICD-10-CM | POA: Diagnosis not present

## 2018-03-22 DIAGNOSIS — C679 Malignant neoplasm of bladder, unspecified: Secondary | ICD-10-CM | POA: Diagnosis not present

## 2018-03-22 DIAGNOSIS — B9562 Methicillin resistant Staphylococcus aureus infection as the cause of diseases classified elsewhere: Secondary | ICD-10-CM | POA: Diagnosis not present

## 2018-03-22 DIAGNOSIS — I5023 Acute on chronic systolic (congestive) heart failure: Secondary | ICD-10-CM | POA: Diagnosis not present

## 2018-03-22 DIAGNOSIS — I130 Hypertensive heart and chronic kidney disease with heart failure and stage 1 through stage 4 chronic kidney disease, or unspecified chronic kidney disease: Secondary | ICD-10-CM | POA: Diagnosis not present

## 2018-03-22 DIAGNOSIS — N390 Urinary tract infection, site not specified: Secondary | ICD-10-CM | POA: Diagnosis not present

## 2018-03-22 DIAGNOSIS — I2510 Atherosclerotic heart disease of native coronary artery without angina pectoris: Secondary | ICD-10-CM | POA: Diagnosis not present

## 2018-03-22 DIAGNOSIS — D631 Anemia in chronic kidney disease: Secondary | ICD-10-CM | POA: Diagnosis not present

## 2018-03-22 DIAGNOSIS — Z436 Encounter for attention to other artificial openings of urinary tract: Secondary | ICD-10-CM | POA: Diagnosis not present

## 2018-03-22 DIAGNOSIS — Z466 Encounter for fitting and adjustment of urinary device: Secondary | ICD-10-CM | POA: Diagnosis not present

## 2018-03-23 ENCOUNTER — Inpatient Hospital Stay: Payer: Medicare HMO

## 2018-03-23 VITALS — BP 118/60 | HR 61 | Temp 97.8°F | Resp 18

## 2018-03-23 DIAGNOSIS — C679 Malignant neoplasm of bladder, unspecified: Secondary | ICD-10-CM

## 2018-03-23 DIAGNOSIS — Z5112 Encounter for antineoplastic immunotherapy: Secondary | ICD-10-CM | POA: Diagnosis not present

## 2018-03-23 LAB — CMP (CANCER CENTER ONLY)
ALK PHOS: 74 U/L (ref 40–150)
ALT: 10 U/L (ref 0–55)
AST: 14 U/L (ref 5–34)
Albumin: 3.7 g/dL (ref 3.5–5.0)
Anion gap: 8 (ref 3–11)
BILIRUBIN TOTAL: 0.3 mg/dL (ref 0.2–1.2)
BUN: 27 mg/dL — AB (ref 7–26)
CALCIUM: 9.8 mg/dL (ref 8.4–10.4)
CO2: 22 mmol/L (ref 22–29)
CREATININE: 1.54 mg/dL — AB (ref 0.70–1.30)
Chloride: 105 mmol/L (ref 98–109)
GFR, EST AFRICAN AMERICAN: 47 mL/min — AB (ref >60)
GFR, EST NON AFRICAN AMERICAN: 41 mL/min — AB (ref >60)
Glucose, Bld: 98 mg/dL (ref 70–140)
Potassium: 4.1 mmol/L (ref 3.5–5.1)
Sodium: 135 mmol/L — ABNORMAL LOW (ref 136–145)
TOTAL PROTEIN: 7.2 g/dL (ref 6.4–8.3)

## 2018-03-23 LAB — CBC WITH DIFFERENTIAL (CANCER CENTER ONLY)
Basophils Absolute: 0.1 10*3/uL (ref 0.0–0.1)
Basophils Relative: 1 %
EOS PCT: 32 %
Eosinophils Absolute: 2 10*3/uL — ABNORMAL HIGH (ref 0.0–0.5)
HEMATOCRIT: 27.5 % — AB (ref 38.4–49.9)
Hemoglobin: 9.2 g/dL — ABNORMAL LOW (ref 13.0–17.1)
LYMPHS PCT: 21 %
Lymphs Abs: 1.3 10*3/uL (ref 0.9–3.3)
MCH: 32.7 pg (ref 27.2–33.4)
MCHC: 33.5 g/dL (ref 32.0–36.0)
MCV: 97.9 fL (ref 79.3–98.0)
Monocytes Absolute: 0.2 10*3/uL (ref 0.1–0.9)
Monocytes Relative: 4 %
NEUTROS ABS: 2.7 10*3/uL (ref 1.5–6.5)
Neutrophils Relative %: 42 %
PLATELETS: 75 10*3/uL — AB (ref 140–400)
RBC: 2.81 MIL/uL — AB (ref 4.20–5.82)
RDW: 16.3 % — ABNORMAL HIGH (ref 11.0–14.6)
WBC: 6.4 10*3/uL (ref 4.0–10.3)

## 2018-03-23 MED ORDER — SODIUM CHLORIDE 0.9% FLUSH
10.0000 mL | INTRAVENOUS | Status: DC | PRN
  Administered 2018-03-23: 10 mL
  Filled 2018-03-23: qty 10

## 2018-03-23 MED ORDER — SODIUM CHLORIDE 0.9 % IV SOLN
200.0000 mg | Freq: Once | INTRAVENOUS | Status: AC
  Administered 2018-03-23: 200 mg via INTRAVENOUS
  Filled 2018-03-23: qty 8

## 2018-03-23 MED ORDER — HEPARIN SOD (PORK) LOCK FLUSH 100 UNIT/ML IV SOLN
500.0000 [IU] | Freq: Once | INTRAVENOUS | Status: AC | PRN
  Administered 2018-03-23: 500 [IU]
  Filled 2018-03-23: qty 5

## 2018-03-23 MED ORDER — SODIUM CHLORIDE 0.9 % IV SOLN
Freq: Once | INTRAVENOUS | Status: AC
  Administered 2018-03-23: 13:00:00 via INTRAVENOUS

## 2018-03-23 NOTE — Patient Instructions (Signed)
[facility] Discharge Instructions for Patients Receiving Chemotherapy  Today you received the following chemotherapy agent: Pembrolizumab Beryle Flock)  To help prevent nausea and vomiting after your treatment, we encourage you to take your nausea medication as prescribed.   If you develop nausea and vomiting that is not controlled by your nausea medication, call the clinic.   BELOW ARE SYMPTOMS THAT SHOULD BE REPORTED IMMEDIATELY:  *FEVER GREATER THAN 100.5 F  *CHILLS WITH OR WITHOUT FEVER  NAUSEA AND VOMITING THAT IS NOT CONTROLLED WITH YOUR NAUSEA MEDICATION  *UNUSUAL SHORTNESS OF BREATH  *UNUSUAL BRUISING OR BLEEDING  TENDERNESS IN MOUTH AND THROAT WITH OR WITHOUT PRESENCE OF ULCERS  *URINARY PROBLEMS  *BOWEL PROBLEMS  UNUSUAL RASH Items with * indicate a potential emergency and should be followed up as soon as possible.  Feel free to call the clinic should you have any questions or concerns. The clinic phone number is [phone].  Please show the [facility] at check-in to the Emergency Department and triage nurse.    Pembrolizumab injection What is this medicine? PEMBROLIZUMAB (pem broe liz ue mab) is a monoclonal antibody. It is used to treat melanoma, head and neck cancer, Hodgkin lymphoma, non-small cell lung cancer, urothelial cancer, stomach cancer, and cancers that have a certain genetic condition. This medicine may be used for other purposes; ask your health care provider or pharmacist if you have questions. COMMON BRAND NAME(S): Keytruda What should I tell my health care provider before I take this medicine? They need to know if you have any of these conditions: -diabetes -immune system problems -inflammatory bowel disease -liver disease -lung or breathing disease -lupus -organ transplant -an unusual or allergic reaction to pembrolizumab, other medicines, foods, dyes, or preservatives -pregnant or trying to get  pregnant -breast-feeding How should I use this medicine? This medicine is for infusion into a vein. It is given by a health care professional in a hospital or clinic setting. A special MedGuide will be given to you before each treatment. Be sure to read this information carefully each time. Talk to your pediatrician regarding the use of this medicine in children. While this drug may be prescribed for selected conditions, precautions do apply. Overdosage: If you think you have taken too much of this medicine contact a poison control center or emergency room at once. NOTE: This medicine is only for you. Do not share this medicine with others. What if I miss a dose? It is important not to miss your dose. Call your doctor or health care professional if you are unable to keep an appointment. What may interact with this medicine? Interactions have not been studied. Give your health care provider a list of all the medicines, herbs, non-prescription drugs, or dietary supplements you use. Also tell them if you smoke, drink alcohol, or use illegal drugs. Some items may interact with your medicine. This list may not describe all possible interactions. Give your health care provider a list of all the medicines, herbs, non-prescription drugs, or dietary supplements you use. Also tell them if you smoke, drink alcohol, or use illegal drugs. Some items may interact with your medicine. What should I watch for while using this medicine? Your condition will be monitored carefully while you are receiving this medicine. You may need blood work done while you are taking this medicine. Do not become pregnant while taking this medicine or for 4 months after stopping it. Women should inform their doctor if they wish to become pregnant or  think they might be pregnant. There is a potential for serious side effects to an unborn child. Talk to your health care professional or pharmacist for more information. Do not breast-feed  an infant while taking this medicine or for 4 months after the last dose. What side effects may I notice from receiving this medicine? Side effects that you should report to your doctor or health care professional as soon as possible: -allergic reactions like skin rash, itching or hives, swelling of the face, lips, or tongue -bloody or black, tarry -breathing problems -changes in vision -chest pain -chills -constipation -cough -dizziness or feeling faint or lightheaded -fast or irregular heartbeat -fever -flushing -hair loss -low blood counts - this medicine may decrease the number of white blood cells, red blood cells and platelets. You may be at increased risk for infections and bleeding. -muscle pain -muscle weakness -persistent headache -signs and symptoms of high blood sugar such as dizziness; dry mouth; dry skin; fruity breath; nausea; stomach pain; increased hunger or thirst; increased urination -signs and symptoms of kidney injury like trouble passing urine or change in the amount of urine -signs and symptoms of liver injury like dark urine, light-colored stools, loss of appetite, nausea, right upper belly pain, yellowing of the eyes or skin -stomach pain -sweating -weight loss Side effects that usually do not require medical attention (report to your doctor or health care professional if they continue or are bothersome): -decreased appetite -diarrhea -tiredness This list may not describe all possible side effects. Call your doctor for medical advice about side effects. You may report side effects to FDA at 1-800-FDA-1088. Where should I keep my medicine? This drug is given in a hospital or clinic and will not be stored at home. NOTE: This sheet is a summary. It may not cover all possible information. If you have questions about this medicine, talk to your doctor, pharmacist, or health care provider.  2018 Elsevier/Gold Standard (2016-09-22 12:29:36)

## 2018-03-23 NOTE — Progress Notes (Signed)
Ok to treat with Creatinine 1.54 and platelets 75 per MD Shadad.

## 2018-03-24 ENCOUNTER — Ambulatory Visit (HOSPITAL_COMMUNITY)
Admission: RE | Admit: 2018-03-24 | Discharge: 2018-03-24 | Disposition: A | Discharge status: Home / Self Care | Payer: Medicare HMO | Source: Ambulatory Visit | Attending: Interventional Radiology | Admitting: Interventional Radiology

## 2018-03-24 ENCOUNTER — Other Ambulatory Visit (HOSPITAL_COMMUNITY): Payer: Self-pay | Admitting: Interventional Radiology

## 2018-03-24 ENCOUNTER — Encounter: Payer: Self-pay | Admitting: Radiology

## 2018-03-24 DIAGNOSIS — C679 Malignant neoplasm of bladder, unspecified: Secondary | ICD-10-CM

## 2018-03-24 DIAGNOSIS — Z436 Encounter for attention to other artificial openings of urinary tract: Secondary | ICD-10-CM | POA: Insufficient documentation

## 2018-03-24 HISTORY — PX: IR PATIENT EVAL TECH 0-60 MINS: IMG5564

## 2018-03-24 NOTE — Procedures (Signed)
Patient came in today with pain and redness at bilateral nephrostomy insertion sites.  Left side had greater pain.  Evaluation of skin site did show that the left side had a slight skin tear secondary to suture/ catheter being pulled.  Per patient and family the right side pain had been resolved after dressing change this morning.  It was noted that the catheter and bags had very little slack and were pulling at the insertion sites.  Rowe Robert PAC did review the site additionally and removed the left sided suture.  The right side suture had already been removed during the last tube change by the radiologist.  The patient's left sided pain was immediately resolved with suture removal.  The leg bags were taped so the cathetesr had no tension at the insertion site.  No antibiotics were warranted at this time.   The patient and family were satisfied and advised to call IrRwith any additional issues

## 2018-03-25 DIAGNOSIS — K624 Stenosis of anus and rectum: Secondary | ICD-10-CM | POA: Diagnosis not present

## 2018-03-25 DIAGNOSIS — L578 Other skin changes due to chronic exposure to nonionizing radiation: Secondary | ICD-10-CM | POA: Diagnosis not present

## 2018-03-25 DIAGNOSIS — Z933 Colostomy status: Secondary | ICD-10-CM | POA: Diagnosis not present

## 2018-03-28 ENCOUNTER — Other Ambulatory Visit (HOSPITAL_COMMUNITY): Payer: Self-pay | Admitting: Radiology

## 2018-03-28 ENCOUNTER — Ambulatory Visit (HOSPITAL_COMMUNITY)
Admission: RE | Admit: 2018-03-28 | Discharge: 2018-03-28 | Disposition: A | Discharge status: Home / Self Care | Payer: Medicare HMO | Source: Ambulatory Visit | Attending: Radiology | Admitting: Radiology

## 2018-03-28 ENCOUNTER — Encounter (HOSPITAL_COMMUNITY): Payer: Self-pay | Admitting: Radiology

## 2018-03-28 DIAGNOSIS — H524 Presbyopia: Secondary | ICD-10-CM | POA: Diagnosis not present

## 2018-03-28 DIAGNOSIS — Z436 Encounter for attention to other artificial openings of urinary tract: Secondary | ICD-10-CM | POA: Insufficient documentation

## 2018-03-28 DIAGNOSIS — Z83511 Family history of glaucoma: Secondary | ICD-10-CM | POA: Diagnosis not present

## 2018-03-28 DIAGNOSIS — C679 Malignant neoplasm of bladder, unspecified: Secondary | ICD-10-CM

## 2018-03-28 DIAGNOSIS — H52203 Unspecified astigmatism, bilateral: Secondary | ICD-10-CM | POA: Diagnosis not present

## 2018-03-28 DIAGNOSIS — H2513 Age-related nuclear cataract, bilateral: Secondary | ICD-10-CM | POA: Diagnosis not present

## 2018-03-28 HISTORY — PX: IR PATIENT EVAL TECH 0-60 MINS: IMG5564

## 2018-03-28 NOTE — Procedures (Signed)
Patient came in today with complaint of nephrostomy bag leaking at the seam.  I exchanged the bag and educated the patient and his wife how to do it at home.  I sent them home with two additional bags in case it happens again.  They said they understood how to change the bag and would call if there are any other issues.

## 2018-03-30 ENCOUNTER — Encounter: Payer: Self-pay | Admitting: Oncology

## 2018-03-31 ENCOUNTER — Other Ambulatory Visit: Payer: Self-pay | Admitting: *Deleted

## 2018-03-31 ENCOUNTER — Inpatient Hospital Stay (HOSPITAL_BASED_OUTPATIENT_CLINIC_OR_DEPARTMENT_OTHER): Payer: Medicare HMO | Admitting: Medical

## 2018-03-31 ENCOUNTER — Inpatient Hospital Stay: Payer: Medicare HMO | Attending: Oncology

## 2018-03-31 ENCOUNTER — Inpatient Hospital Stay: Payer: Medicare HMO

## 2018-03-31 ENCOUNTER — Telehealth: Payer: Self-pay | Admitting: *Deleted

## 2018-03-31 VITALS — BP 107/67 | HR 63 | Temp 98.2°F | Resp 17 | Ht 68.0 in | Wt 178.6 lb

## 2018-03-31 DIAGNOSIS — Z95828 Presence of other vascular implants and grafts: Secondary | ICD-10-CM | POA: Insufficient documentation

## 2018-03-31 DIAGNOSIS — Z936 Other artificial openings of urinary tract status: Secondary | ICD-10-CM | POA: Diagnosis not present

## 2018-03-31 DIAGNOSIS — R630 Anorexia: Secondary | ICD-10-CM | POA: Diagnosis not present

## 2018-03-31 DIAGNOSIS — C679 Malignant neoplasm of bladder, unspecified: Secondary | ICD-10-CM | POA: Insufficient documentation

## 2018-03-31 DIAGNOSIS — Z436 Encounter for attention to other artificial openings of urinary tract: Secondary | ICD-10-CM | POA: Diagnosis not present

## 2018-03-31 DIAGNOSIS — D6959 Other secondary thrombocytopenia: Secondary | ICD-10-CM | POA: Insufficient documentation

## 2018-03-31 DIAGNOSIS — I130 Hypertensive heart and chronic kidney disease with heart failure and stage 1 through stage 4 chronic kidney disease, or unspecified chronic kidney disease: Secondary | ICD-10-CM | POA: Diagnosis not present

## 2018-03-31 DIAGNOSIS — G893 Neoplasm related pain (acute) (chronic): Secondary | ICD-10-CM | POA: Insufficient documentation

## 2018-03-31 DIAGNOSIS — I5023 Acute on chronic systolic (congestive) heart failure: Secondary | ICD-10-CM | POA: Diagnosis not present

## 2018-03-31 DIAGNOSIS — B9562 Methicillin resistant Staphylococcus aureus infection as the cause of diseases classified elsewhere: Secondary | ICD-10-CM | POA: Diagnosis not present

## 2018-03-31 DIAGNOSIS — R0609 Other forms of dyspnea: Secondary | ICD-10-CM | POA: Diagnosis not present

## 2018-03-31 DIAGNOSIS — R509 Fever, unspecified: Secondary | ICD-10-CM

## 2018-03-31 DIAGNOSIS — R05 Cough: Secondary | ICD-10-CM | POA: Insufficient documentation

## 2018-03-31 DIAGNOSIS — C7951 Secondary malignant neoplasm of bone: Secondary | ICD-10-CM | POA: Diagnosis not present

## 2018-03-31 DIAGNOSIS — Z466 Encounter for fitting and adjustment of urinary device: Secondary | ICD-10-CM | POA: Diagnosis not present

## 2018-03-31 DIAGNOSIS — Z433 Encounter for attention to colostomy: Secondary | ICD-10-CM | POA: Diagnosis not present

## 2018-03-31 DIAGNOSIS — I82409 Acute embolism and thrombosis of unspecified deep veins of unspecified lower extremity: Secondary | ICD-10-CM | POA: Diagnosis not present

## 2018-03-31 DIAGNOSIS — R410 Disorientation, unspecified: Secondary | ICD-10-CM | POA: Insufficient documentation

## 2018-03-31 DIAGNOSIS — N390 Urinary tract infection, site not specified: Secondary | ICD-10-CM | POA: Diagnosis not present

## 2018-03-31 DIAGNOSIS — N1330 Unspecified hydronephrosis: Secondary | ICD-10-CM | POA: Insufficient documentation

## 2018-03-31 DIAGNOSIS — D631 Anemia in chronic kidney disease: Secondary | ICD-10-CM | POA: Diagnosis not present

## 2018-03-31 DIAGNOSIS — D649 Anemia, unspecified: Secondary | ICD-10-CM | POA: Insufficient documentation

## 2018-03-31 DIAGNOSIS — M2550 Pain in unspecified joint: Secondary | ICD-10-CM | POA: Insufficient documentation

## 2018-03-31 DIAGNOSIS — C678 Malignant neoplasm of overlapping sites of bladder: Secondary | ICD-10-CM

## 2018-03-31 DIAGNOSIS — I2510 Atherosclerotic heart disease of native coronary artery without angina pectoris: Secondary | ICD-10-CM | POA: Diagnosis not present

## 2018-03-31 LAB — CBC WITH DIFFERENTIAL (CANCER CENTER ONLY)
Basophils Absolute: 0.1 10*3/uL (ref 0.0–0.1)
Basophils Relative: 1 %
Eosinophils Absolute: 1.6 10*3/uL — ABNORMAL HIGH (ref 0.0–0.5)
Eosinophils Relative: 28 %
HEMATOCRIT: 25.3 % — AB (ref 38.4–49.9)
Hemoglobin: 8.3 g/dL — ABNORMAL LOW (ref 13.0–17.1)
LYMPHS ABS: 1.1 10*3/uL (ref 0.9–3.3)
LYMPHS PCT: 19 %
MCH: 32.3 pg (ref 27.2–33.4)
MCHC: 32.9 g/dL (ref 32.0–36.0)
MCV: 98.3 fL — ABNORMAL HIGH (ref 79.3–98.0)
MONOS PCT: 8 %
Monocytes Absolute: 0.4 10*3/uL (ref 0.1–0.9)
NEUTROS ABS: 2.5 10*3/uL (ref 1.5–6.5)
NEUTROS PCT: 44 %
Platelet Count: 65 10*3/uL — ABNORMAL LOW (ref 140–400)
RBC: 2.57 MIL/uL — ABNORMAL LOW (ref 4.20–5.82)
RDW: 16.4 % — ABNORMAL HIGH (ref 11.0–14.6)
WBC Count: 5.6 10*3/uL (ref 4.0–10.3)

## 2018-03-31 LAB — CMP (CANCER CENTER ONLY)
ALT: 8 U/L (ref 0–55)
ANION GAP: 8 (ref 3–11)
AST: 13 U/L (ref 5–34)
Albumin: 3.3 g/dL — ABNORMAL LOW (ref 3.5–5.0)
Alkaline Phosphatase: 67 U/L (ref 40–150)
BUN: 24 mg/dL (ref 7–26)
CHLORIDE: 105 mmol/L (ref 98–109)
CO2: 22 mmol/L (ref 22–29)
Calcium: 9.5 mg/dL (ref 8.4–10.4)
Creatinine: 1.48 mg/dL — ABNORMAL HIGH (ref 0.70–1.30)
GFR, EST AFRICAN AMERICAN: 50 mL/min — AB (ref >60)
GFR, Estimated: 43 mL/min — ABNORMAL LOW (ref >60)
Glucose, Bld: 106 mg/dL (ref 70–140)
Potassium: 4.4 mmol/L (ref 3.5–5.1)
SODIUM: 135 mmol/L — AB (ref 136–145)
Total Bilirubin: 0.4 mg/dL (ref 0.2–1.2)
Total Protein: 6.8 g/dL (ref 6.4–8.3)

## 2018-03-31 MED ORDER — SODIUM CHLORIDE 0.9% FLUSH
10.0000 mL | Freq: Once | INTRAVENOUS | Status: AC
  Administered 2018-03-31: 10 mL
  Filled 2018-03-31: qty 10

## 2018-03-31 MED ORDER — HEPARIN SOD (PORK) LOCK FLUSH 100 UNIT/ML IV SOLN
500.0000 [IU] | Freq: Once | INTRAVENOUS | Status: AC
  Administered 2018-03-31: 500 [IU]
  Filled 2018-03-31: qty 5

## 2018-03-31 MED ORDER — AMOXICILLIN 500 MG PO CAPS: 500.0000 mg | ORAL_CAPSULE | Freq: Three times a day (TID) | ORAL | 0 refills | Status: DC

## 2018-03-31 NOTE — Progress Notes (Signed)
Pt's wife reports intermittent confusion and fevers as well as redness and tenderness around nephrostomy tube site.  States that since he came off amoxicillin two weeks ago he's been having problems.  Reports pus draining from site and blood in the tube as well.  Says fever worsens at night then goes away in the morning.  Reports minimal urine drainage and increased flank pain.

## 2018-03-31 NOTE — Telephone Encounter (Signed)
Received call from pt's wife stating that she thinks he has infection in his nephrostomy tubes.  He has spiked fevers at hs & is lucid but sometimes will say something strange in the middle of a sentence. He has also had some blood & very little urine from one tube & pain.  Blood has cleared with increased fluids. She states that he was supposed to have changed on wed but has been changed to 15th.  She feels that this needs to be done soon & is wondering if there is a long term ATB that he could be put on since he does better with an ATB & as soon as he comes off, he develops another infection.  She has talked with [facility] MD who thinks he needs to be seen.  She is not happy with how things have gone with IR dept & states that this a constant problem & has been in the hospital 3 times due to these issues.  She doesn't want to take him to [facility] but states if something isn't done soon, she will have to do this.  Message routed to Dr Corliss Parish RN.

## 2018-03-31 NOTE — Telephone Encounter (Signed)
As noted below by Dr. [name], I spoke with wife and they were on their way to [facility] ER per home nurse. I told them to come to [facility] to be seen in the Symptom Management Clinic with Sandi Mealy, NP. Wife verbalized understanding.

## 2018-03-31 NOTE — Telephone Encounter (Signed)
He can be evaluated at the symptoms management clinic.

## 2018-03-31 NOTE — Patient Instructions (Signed)
Percutaneous Nephrostomy Home Guide Percutaneous nephrostomy is a procedure to insert a flexible tube into your kidney so that urine can leave your body. This procedure may be done if a medical condition prevents urine from leaving your kidney in the usual way. During the procedure, the nephrostomy tube is inserted in the right or left side of your lower back and is connected to an external drainage bag. After you have a nephrostomy tube placed, urine will collect in the drainage bag outside of your body. You will need to empty and change the drainage bag as needed. You will also need to take steps to care for the area where the nephrostomy tube was inserted (tube insertion site). How do I care for my nephrostomy tube?  Always keep your tubing, the leg bag, or the bedside drainage bag below the level of your kidney so that your urine drains freely.  Avoid activities that would cause bending or pulling of your tubing. Ask your health care provider what activities are safe for you.  When connecting your nephrostomy tube to a drainage bag, make sure that there are no kinks in the tubing and that your urine is draining freely. You may want to gently wrap an elastic bandage over the tubing. This will help keep the tubing in place and prevent it from kinking. Make sure there is no tension on the tubing so it does not become dislodged.  At night, you may want to connect your nephrostomy tube or the leg bag to a larger bedside drainage bag. How do I empty the drainage bag? Empty the leg bag or bedside drainage bag whenever it becomes ? full. Also empty it before you go to sleep. Most drainage bags have a drain at the bottom that allows urine to be emptied. Follow these basic steps: 1. Hold the drainage bag over a toilet or collection container. Use a measuring container if your health care provider told you to measure your urine. 2. Open the drain of the bag and allow the urine to drain out. 3. After all the  urine has drained from the drainage bag, close the drain fully. 4. Flush the urine down the toilet. If a collection container was used, rinse the container.  How do I change the dressing around the nephrostomy tube? Change your dressing and clean your tube exit site as told by your health care provider. You may need to change the dressing every day for the first 2 weeks after having a nephrostomy tube inserted. After the first 2 weeks, you may be told to change the dressing two times a week. Supplies needed:  Mild soap and water.  Split gauze pads, 4  4 inches (10 x 10 cm).  Gauze pads, 4  4 inches (10 x 10 cm).  Paper tape. How to change the dressing: Because of the location of your nephrostomy tube, you may need help from another person to complete dressing changes. Follow these basic steps: 1. Wash hands with soap and water. 2. Gently remove the tape and dressing from around the nephrostomy tube. Be careful not to pull on the tube while removing the dressing. Avoid using scissors because they may damage the tube. 3. Wash the skin around the tube with mild soap and water, rinse well, and pat the skin dry with a clean cloth. 4. Check the skin around the drain for redness, swelling, pus, warmth, or a bad smell. 5. If the drain was sutured to the skin, check the suture to  verify that it is still anchored in the skin. 6. Place two split gauze pads in and around the tube exit site. Do not apply ointments or alcohol to the site. 7. Place a gauze pad on top of the split gauze pad. 8. Coil the tube on top of the gauze. The tubing should rest on the gauze, not on the skin. 9. Place tape around each edge of the gauze pad. 10. Secure the nephrostomy tubing. Make sure that the tube does not kink or become pinched. The tubing should rest on the gauze pad, not on the skin. 11. Dispose of used supplies properly.  How do I flush my nephrostomy tube? Use a saline syringe to rinse out (flush) your  nephrostomy tube as told by your health care provider. Flushing is easier if a three-way stopcock is placed between the tube and the drainage bag. One connection of the stopcock connects to your tube, the second connects to the drainage bag, and the third is usually covered with a cap. The lever on the stopcock points to the direction on the stopcock that is closed to flow. Normally, the lever points in the direction of the cap to allow urine to drain from the tube to the drainage bag. Supplies needed:  Rubbing alcohol wipe.  10 mL 0.9% saline syringe. How to flush the tube: 1. Move the lever of the three-way stopcock so it points toward the drainage bag. 2. Clean the cap with a rubbing alcohol wipe. 3. Screw the tip of a 10 mL 0.9% saline syringe onto the cap. 4. Using the syringe plunger, slowly push the 10 mL 0.9% saline in the syringe over 5-10 seconds. If resistance is met or pain occurs while pushing, stop pushing the saline. 5. Remove the syringe from the cap. 6. Return the stopcock lever to the usual position, pointing in the direction of the cap. 7. Dispose of used supplies properly. How do I replace the drainage bag? Replace the drainage bag, three-way stopcock, and any extension tubing as told by your health care provider. Make sure you always have an extra drainage bag and connecting tubing available. 1. Empty urine from your drainage bag. 2. Gather a new drainage bag, three-way stopcock, and any extension tubing. 3. Remove the drainage bag, three-way stopcock, and any extension tubing from the nephrostomy tube. 4. Attach the new leg bag or bedside drainage bag, three-way stopcock, and any extension tubing to the nephrostomy tube. 5. Dispose of the used drainage bag, three-way stopcock, and any extension.  Contact a health care provider if:  You have problems with any of the valves or tubing.  You have persistent pain or soreness in your back.  You have more redness, swelling,  or pain around your tube insertion site.  You have more fluid or blood coming from your tube insertion site.  Your tube insertion site feels warm to the touch.  You have pus or a bad smell coming from your tube insertion site.  You have increased urine output or you feel burning when urinating. Get help right away if:  You have pain in your abdomen during the first week.  You have chest pain or have trouble breathing.  You have a new appearance of blood in your urine.  You have a fever or chills.  You have back pain that is not relieved by your medicine.  You have decreased urine output.  Your nephrostomy tube comes out. This information is not intended to replace advice given to  you by your health care provider. Make sure you discuss any questions you have with your health care provider. Document Released: 10/04/2013 Document Revised: 09/25/2016 Document Reviewed: 09/25/2016 Elsevier Interactive Patient Education  [employer].

## 2018-04-01 ENCOUNTER — Telehealth: Payer: Self-pay | Admitting: Family Medicine

## 2018-04-01 ENCOUNTER — Encounter: Payer: Self-pay | Admitting: Medical

## 2018-04-01 NOTE — Telephone Encounter (Signed)
Sent to PCP for approval for verbal orders  

## 2018-04-01 NOTE — Telephone Encounter (Signed)
Copied from [place]. Topic: Quick Communication - See Telephone Encounter >> Apr 01, 2018  9:06 AM Synthia Innocent wrote: CRM for notification. See Telephone encounter for: 04/01/18. Requesting verbals orders for 5 more nursing visits for continued education

## 2018-04-01 NOTE — Progress Notes (Signed)
[facility] Progress Note   Charles Coffey [id number] 16-Sep-1935 83 y.o.  Charles Coffey is managed by Dr. [name]  Actively treated with chemotherapy: yes  Current Therapy: Keytruda  Last Treated: 03/23/2018 (cycle 1, day 1)  Assessment: Plan:    Port-A-Cath in place - Plan: heparin lock flush 100 unit/mL, sodium chloride flush (NS) 0.9 % injection 10 mL  Urothelial carcinoma of bladder ([place]) - Plan: heparin lock flush 100 unit/mL, sodium chloride flush (NS) 0.9 % injection 10 mL  Fever, unspecified fever cause - Plan: amoxicillin (AMOXIL) 500 MG capsule  Please see After Visit Summary for patient specific instructions.  Future Appointments  Date Time Provider [place]  04/11/2018  1:30 PM [facility] 1 [facility] [place]  04/13/2018  8:30 AM [facility] LAB 6 [facility] None  04/13/2018  8:45 AM [facility] FLUSH NURSE 2 [facility] None  04/13/2018  9:15 AM Shadad, Mathis Dad, MD [facility] None  04/13/2018 10:15 AM [facility] D11 [facility] None    No orders of the defined types were placed in this encounter.      Subjective:   Patient ID:  Charles Coffey is a 83 y.o. (DOB 13-Jun-1935) male.  Chief Complaint:  Chief Complaint  Patient presents with  . problems with nephrostomy tube    HPI Charles Coffey is an 83 year old male with a history of a stage IV urothelial carcinoma who is status post cycle 1, day 1 of Keytruda dosed on 03/23/2018 under the direction of Dr. [name].  The patient also has bilateral nephrostomy tubes.  His nephrostomy tubes are scheduled to be changed on 04/11/2018.  He has decreased output to the left nephrostomy tube.  Last Thursday he was seen in the emergency room and was found to have that a suture associated with his nephrostomy tube was tearing his skin.  He has been having fevers of up to 100 point 6 at night.  His wife reports that he is not on any antibiotics.  His fevers have recurred since he has stopped  antibiotics.  She notes that he has had mental status changes when he has fevers.  He is being seen by home health and has his dressing changed by them.  It was noted that he was having some blood in his right nephrostomy tube with blood purulent material found on the dressing.  He is planning to travel to [place] with a family friend next week for his birthday.  Medications: I have reviewed the patient's current medications.  Allergies:  Allergies  Allergen Reactions  . Antihistamines, Loratadine-Type Other (See Comments)    Unable to urinate  . Statins Other (See Comments)    liver effects    Past Medical History:  Diagnosis Date  . Aortic atherosclerosis ([place])   . BPH with urinary obstruction   . CAD (coronary artery disease)    a.  MI 1995, CABG x 3 2002 (patient says that he had LIMA and RIMA grafts). b. ETT-Cardiolite (10/15) with EF 48%, apical scar, no ischemia. c. Nuc 07/2017 abnormal -> cath was performed,  patent LIMA to LAD and SVG to OM 2. RIMA to RCA is atretic. The right coronary artery is occluded distally with extensive collaterals from the LAD, medical therapy.   . Cancer [facility])    bladder cancer  . Cervical spondylosis without myelopathy 12/14/2016  . Chronic low back pain    sees Dr. Kary Kos   . GERD (gastroesophageal reflux disease)   . Hiatal hernia   .  Hyperlipidemia   . Hypertension   . IBS (irritable bowel syndrome)   . Ischemic cardiomyopathy   . Myocardial infarction ([place])   . RBBB   . Restless legs syndrome   . Statin intolerance   . Syncope    a. in 2015 - no apparent cause, was taking sleep medicine at the time. Cardiac workup unremarkable.  . Tubular adenoma of colon     Past Surgical History:  Procedure Laterality Date  . COLONOSCOPY  10/17/2014   per Dr. Hilarie Fredrickson, tubular adenomas, repeat in 3 yrs   . COLONOSCOPY WITH PROPOFOL N/A 12/01/2017   Procedure: COLONOSCOPY WITH PROPOFOL;  Surgeon: Milus Banister, MD;  [facility];   Service: Endoscopy;  Laterality: N/A;  . CYSTOSCOPY W/ RETROGRADES Left 11/25/2017   Procedure: CYSTOSCOPY WITH RETROGRADE PYELOGRAM;  Surgeon: Irine Seal, MD;  Location[facility];  Service: Urology;  Laterality: Left;  . HEART BYPASS    . IR CONVERT RIGHT NEPHROSTOMY TO NEPHROURETERAL CATH  02/04/2018  . IR FLUORO GUIDE CV LINE RIGHT  12/27/2017  . IR NEPHROSTOGRAM LEFT THRU EXISTING ACCESS  12/24/2017  . IR NEPHROSTOMY EXCHANGE LEFT  01/28/2018  . IR NEPHROSTOMY EXCHANGE LEFT  02/04/2018  . IR NEPHROSTOMY EXCHANGE LEFT  02/25/2018  . IR NEPHROSTOMY EXCHANGE RIGHT  12/24/2017  . IR NEPHROSTOMY EXCHANGE RIGHT  01/28/2018  . IR NEPHROSTOMY PLACEMENT LEFT  11/28/2017  . IR NEPHROSTOMY PLACEMENT RIGHT  12/03/2017  . IR PATIENT EVAL TECH 0-60 MINS  03/24/2018  . IR PATIENT EVAL TECH 0-60 MINS  03/28/2018  . IR US GUIDE VASC ACCESS RIGHT  12/27/2017  . LAPAROSCOPY N/A 12/01/2017   Procedure: LAPAROSCOPIC DIVERTING OSTOMY;  Surgeon: Stark Klein, MD;  [facility];  Service: General;  Laterality: N/A;  . LEFT HEART CATH AND CORS/GRAFTS ANGIOGRAPHY N/A 08/25/2017   Procedure: LEFT HEART CATH AND CORS/GRAFTS ANGIOGRAPHY;  Surgeon: Wellington Hampshire, MD;  Location: [place] CV LAB;  Service: Cardiovascular;  Laterality: N/A;  . LUMBAR FUSION  2003   L3-L4  . PROSTATE SURGERY  06-27-12   per Dr. Roni Bread, had CTT  . TONSILLECTOMY    . TRANSURETHRAL RESECTION OF BLADDER TUMOR N/A 11/25/2017   Procedure: TRANSURETHRAL RESECTION OF BLADDER TUMOR (TURBT);  Surgeon: Irine Seal, MD;  [facility];  Service: Urology;  Laterality: N/A;    Family History  Problem Relation Age of Onset  . Heart attack Mother   . Aneurysm Father        femoral artery  . Heart disease Brother   . Heart disease Maternal Uncle        x 2  . Colon cancer Neg Hx   . Esophageal cancer Neg Hx   . Pancreatic cancer Neg Hx   . Kidney disease Neg Hx   . Liver disease Neg Hx     Social History   Socioeconomic History  . Marital  status: Married    Spouse name: Not on file  . Number of children: 5  . Years of education: Some coll  . Highest education level: Not on file  Occupational History  . Occupation: retired  Scientific laboratory technician  . Financial resource strain: Not on file  . Food insecurity:    Worry: Not on file    Inability: Not on file  . Transportation needs:    Medical: Not on file    Non-medical: Not on file  Tobacco Use  . Smoking status: Former Smoker    Types: Cigarettes  Last attempt to quit: 12/28/1978    Years since quitting: 39.2  . Smokeless tobacco: Never Used  Substance and Sexual Activity  . Alcohol use: Yes    Alcohol/week: 1.2 oz    Types: 1 Glasses of wine, 1 Cans of beer per week    Comment: occassional  . Drug use: No  . Sexual activity: Not on file  Lifestyle  . Physical activity:    Days per week: Not on file    Minutes per session: Not on file  . Stress: Not on file  Relationships  . Social connections:    Talks on phone: Not on file    Gets together: Not on file    Attends religious service: Not on file    Active member of club or organization: Not on file    Attends meetings of clubs or organizations: Not on file    Relationship status: Not on file  . Intimate partner violence:    Fear of current or ex partner: Not on file    Emotionally abused: Not on file    Physically abused: Not on file    Forced sexual activity: Not on file  Other Topics Concern  . Not on file  Social History Narrative   Lives at home w/ his wife   Right-handed   5 cups of coffee per day    Past Medical History, Surgical history, Social history, and Family history were reviewed and updated as appropriate.   Please see review of systems for further details on the patient's review from today.   Review of Systems:  Review of Systems  Constitutional: Positive for chills, diaphoresis and fever.  Genitourinary:       Decreased output to a left nephrostomy tube.  Skin:       Erythema at the  insertion site of bilateral nephrostomy tubes with blood and purulent material noted on the dressing covering the right nephrostomy tube.  Psychiatric/Behavioral: Positive for confusion (Mental status changes associated with fevers.).    Objective:   Physical Exam:  BP 107/67 (BP Location: Right Arm, Patient Position: Sitting)   Pulse 63   Temp 98.2 F (36.8 C) (Oral)   Resp 17   Ht 5\' 8"  (1.727 m)   Wt 178 lb 9.6 oz (81 kg)   SpO2 98%   BMI 27.16 kg/m  ECOG: 1  Physical Exam  Constitutional: No distress.  HENT:  Head: Normocephalic and atraumatic.  Cardiovascular: Normal rate.  Murmur heard.  Systolic murmur is present with a grade of 1/6. Pulmonary/Chest: Effort normal and breath sounds normal. No respiratory distress. He has no wheezes. He has no rales.  Neurological: He is alert. Coordination normal.  Skin: He is not diaphoretic.     Psychiatric: He has a normal mood and affect. His behavior is normal. Judgment and thought content normal.    Lab Review:     Component Value Date/Time   NA 135 (L) 03/31/2018 1536   NA 138 02/24/2018 1646   NA 133 (L) 12/29/2017 1014   K 4.4 03/31/2018 1536   K 4.4 12/29/2017 1014   CL 105 03/31/2018 1536   CO2 22 03/31/2018 1536   CO2 20 (L) 12/29/2017 1014   GLUCOSE 106 03/31/2018 1536   GLUCOSE 102 12/29/2017 1014   BUN 24 03/31/2018 1536   BUN 25 02/24/2018 1646   BUN 31.5 (H) 12/29/2017 1014   CREATININE 1.48 (H) 03/31/2018 1536   CREATININE 1.6 (H) 12/29/2017 1014   CALCIUM 9.5  03/31/2018 1536   CALCIUM 9.6 12/29/2017 1014   PROT 6.8 03/31/2018 1536   PROT 6.8 12/29/2017 1014   ALBUMIN 3.3 (L) 03/31/2018 1536   ALBUMIN 3.8 12/29/2017 1014   AST 13 03/31/2018 1536   AST 15 12/29/2017 1014   ALT 8 03/31/2018 1536   ALT 13 12/29/2017 1014   ALKPHOS 67 03/31/2018 1536   ALKPHOS 61 12/29/2017 1014   BILITOT 0.4 03/31/2018 1536   BILITOT 0.43 12/29/2017 1014   GFRNONAA 43 (L) 03/31/2018 1536   GFRAA 50 (L)  03/31/2018 1536       Component Value Date/Time   WBC 5.6 03/31/2018 1536   WBC 7.4 02/16/2018 2336   RBC 2.57 (L) 03/31/2018 1536   HGB 8.3 (L) 02/16/2018 2336   HGB 8.8 (L) 12/29/2017 1014   HCT 25.3 (L) 03/31/2018 1536   HCT 26.0 (L) 12/29/2017 1014   PLT 65 (L) 03/31/2018 1536   PLT 70 (L) 12/29/2017 1014   PLT 115 (L) 08/31/2017 1135   MCV 98.3 (H) 03/31/2018 1536   MCV 89.3 12/29/2017 1014   MCH 32.3 03/31/2018 1536   MCHC 32.9 03/31/2018 1536   RDW 16.4 (H) 03/31/2018 1536   RDW 15.5 (H) 12/29/2017 1014   LYMPHSABS 1.1 03/31/2018 1536   LYMPHSABS 1.4 12/29/2017 1014   MONOABS 0.4 03/31/2018 1536   MONOABS 0.6 12/29/2017 1014   EOSABS 1.6 (H) 03/31/2018 1536   EOSABS 0.2 12/29/2017 1014   BASOSABS 0.1 03/31/2018 1536   BASOSABS 0.0 12/29/2017 1014   -------------------------------  Imaging from last 24 hours (if applicable):  Radiology interpretation: Ct Abdomen Pelvis Wo Contrast  Result Date: 03/10/2018 CLINICAL DATA:  Bladder cancer.  Follow-up. EXAM: CT CHEST, ABDOMEN AND PELVIS WITHOUT CONTRAST TECHNIQUE: Multidetector CT imaging of the chest, abdomen and pelvis was performed following the standard protocol without IV contrast. COMPARISON:  CT AP 01/01/2018. FINDINGS: CT CHEST FINDINGS Cardiovascular: The heart size appears normal. Aortic atherosclerosis. Calcification within the LAD, left circumflex and RCA coronary arteries noted. No pericardial effusion. Mediastinum/Nodes: Normal appearance of the thyroid gland. The trachea appears patent and is midline. Normal appearance of the esophagus. No enlarged mediastinal or hilar lymph nodes. Lungs/Pleura: No pleural effusion. Scattered ground-glass and tree-in-bud nodules within the upper lung zones are noted, likely post infectious/inflammatory. A few scattered solid nodules are noted in both lungs. Index nodule within the left upper lobe measures 2 mm, image 39/6. Anterior right upper lobe lung nodule measures 2 mm,  image 58/series 6. Nonspecific right middle lobe lung nodule measures 4 mm. Also in the right middle lobe is a 4 mm lung nodule, image 89/6. 3 mm right middle lobe lung nodule noted. In the posterior left lung base there is a 5 mm lung nodule, image 136/6. Musculoskeletal: There is mild scoliosis and degenerative disc disease identified no suspicious bone lesions. CT ABDOMEN PELVIS FINDINGS Hepatobiliary: No focal liver abnormality is seen. No gallstones, gallbladder wall thickening, or biliary dilatation. Pancreas: Unremarkable. No pancreatic ductal dilatation or surrounding inflammatory changes. Spleen: Normal in size without focal abnormality. Adrenals/Urinary Tract: The adrenal glands appear normal. Bilateral percutaneous nephrostomy tubes are identified. A right-sided nephroureteral stent is also in place. No hydronephrosis. Asymmetric atrophy of the left kidney compared with the right. Soft tissue nodularity along the course of the left ureter is again noted. Similar to previous exam. Also similar is irregular nodular thickening of Gerota's fascia on the left. There is asymmetric soft tissue thickening involving the left posterior bladder wall and  distal left ureter. This is difficult to assess without IV contrast material and with a partially decompressed urinary bladder. This measures approximately 5.0 x 2.9 cm, image 107/2. Previously 4.7 x 4.4 cm. Mild diffuse bladder wall thickening. Similar to previous exam. Stomach/Bowel: The stomach appears normal. The small bowel loops have a normal course and caliber without obstruction. Left abdominal wall double barrel colostomy identified. No pathologic dilatation of the large bowel. Abnormal wall thickening involving the rectum is again identified, image 112 of series 2. Surrounding increased soft tissue is noted which is stable. Vascular/Lymphatic: Aortic atherosclerosis noted. No aneurysm. Similar appearance of mild nodularity between the abdominal aorta and  left ureter. No discrete measurable adenopathy within the abdomen or pelvis. Reproductive: Stable prostate gland enlargement. Other: No free fluid or fluid collections. Musculoskeletal: No suspicious bone lesions. Scoliosis. Previous posterior hardware fixation of the lower lumbar spine. IMPRESSION: 1. Similar appearance of abnormal asymmetric increased soft tissue within the left posterior bladder base encasing the distal left ureter. 2. Stable appearance of soft tissue nodularity along the course of the left ureter and in between the left kidney and left side of aorta. Nodularity involving the Gerota's fascia on the left is also similar. 3. Stable appearance of small pulmonary nodules. 4. Status post bilateral percutaneous nephrostomy tube placement with decompression of previous hydronephrosis. 5. Stable soft tissue thickening of the rectum. 6. Aortic Atherosclerosis (ICD10-I70.0). Coronary artery atherosclerotic calcifications noted. Electronically Signed   By: Kerby Moors M.D.   On: 03/10/2018 15:30   Ct Chest Wo Contrast  Result Date: 03/10/2018 CLINICAL DATA:  Bladder cancer.  Follow-up. EXAM: CT CHEST, ABDOMEN AND PELVIS WITHOUT CONTRAST TECHNIQUE: Multidetector CT imaging of the chest, abdomen and pelvis was performed following the standard protocol without IV contrast. COMPARISON:  CT AP 01/01/2018. FINDINGS: CT CHEST FINDINGS Cardiovascular: The heart size appears normal. Aortic atherosclerosis. Calcification within the LAD, left circumflex and RCA coronary arteries noted. No pericardial effusion. Mediastinum/Nodes: Normal appearance of the thyroid gland. The trachea appears patent and is midline. Normal appearance of the esophagus. No enlarged mediastinal or hilar lymph nodes. Lungs/Pleura: No pleural effusion. Scattered ground-glass and tree-in-bud nodules within the upper lung zones are noted, likely post infectious/inflammatory. A few scattered solid nodules are noted in both lungs. Index  nodule within the left upper lobe measures 2 mm, image 39/6. Anterior right upper lobe lung nodule measures 2 mm, image 58/series 6. Nonspecific right middle lobe lung nodule measures 4 mm. Also in the right middle lobe is a 4 mm lung nodule, image 89/6. 3 mm right middle lobe lung nodule noted. In the posterior left lung base there is a 5 mm lung nodule, image 136/6. Musculoskeletal: There is mild scoliosis and degenerative disc disease identified no suspicious bone lesions. CT ABDOMEN PELVIS FINDINGS Hepatobiliary: No focal liver abnormality is seen. No gallstones, gallbladder wall thickening, or biliary dilatation. Pancreas: Unremarkable. No pancreatic ductal dilatation or surrounding inflammatory changes. Spleen: Normal in size without focal abnormality. Adrenals/Urinary Tract: The adrenal glands appear normal. Bilateral percutaneous nephrostomy tubes are identified. A right-sided nephroureteral stent is also in place. No hydronephrosis. Asymmetric atrophy of the left kidney compared with the right. Soft tissue nodularity along the course of the left ureter is again noted. Similar to previous exam. Also similar is irregular nodular thickening of Gerota's fascia on the left. There is asymmetric soft tissue thickening involving the left posterior bladder wall and distal left ureter. This is difficult to assess without IV contrast material and with a  partially decompressed urinary bladder. This measures approximately 5.0 x 2.9 cm, image 107/2. Previously 4.7 x 4.4 cm. Mild diffuse bladder wall thickening. Similar to previous exam. Stomach/Bowel: The stomach appears normal. The small bowel loops have a normal course and caliber without obstruction. Left abdominal wall double barrel colostomy identified. No pathologic dilatation of the large bowel. Abnormal wall thickening involving the rectum is again identified, image 112 of series 2. Surrounding increased soft tissue is noted which is stable. Vascular/Lymphatic:  Aortic atherosclerosis noted. No aneurysm. Similar appearance of mild nodularity between the abdominal aorta and left ureter. No discrete measurable adenopathy within the abdomen or pelvis. Reproductive: Stable prostate gland enlargement. Other: No free fluid or fluid collections. Musculoskeletal: No suspicious bone lesions. Scoliosis. Previous posterior hardware fixation of the lower lumbar spine. IMPRESSION: 1. Similar appearance of abnormal asymmetric increased soft tissue within the left posterior bladder base encasing the distal left ureter. 2. Stable appearance of soft tissue nodularity along the course of the left ureter and in between the left kidney and left side of aorta. Nodularity involving the Gerota's fascia on the left is also similar. 3. Stable appearance of small pulmonary nodules. 4. Status post bilateral percutaneous nephrostomy tube placement with decompression of previous hydronephrosis. 5. Stable soft tissue thickening of the rectum. 6. Aortic Atherosclerosis (ICD10-I70.0). Coronary artery atherosclerotic calcifications noted. Electronically Signed   By: Kerby Moors M.D.   On: 03/10/2018 15:30   Ir Patient Eval Tech 0-60 Mins  Result Date: 03/28/2018 [place]pper [place]     03/28/2018 10:35 AM Patient came in today with complaint of nephrostomy bag leaking at the seam.  I exchanged the bag and educated the patient and his wife how to do it at home.  I sent them home with two additional bags in case it happens again.  They said they understood how to change the bag and would call if there are any other issues.  Ir Patient Eval Tech 0-60 Mins  Result Date: 03/24/2018 [place]pper [place]     03/24/2018 10:15 AM Patient came in today with pain and redness at bilateral nephrostomy insertion sites.  Left side had greater pain.  Evaluation of skin site did show that the left side had a slight skin tear secondary to suture/ catheter being pulled.  Per patient and family the right side pain had been  resolved after dressing change this morning.  It was noted that the catheter and bags had very little slack and were pulling at the insertion sites.  Rowe Robert PAC did review the site additionally and removed the left sided suture.  The right side suture had already been removed during the last tube change by the radiologist.  The patient's left sided pain was immediately resolved with suture removal.  The leg bags were taped so the cathetesr had no tension at the insertion site.  No antibiotics were warranted at this time.   The patient and family were satisfied and advised to call IrRwith any additional issues     Patient seen and examined personally today.  Labs were personally reviewed and discussed with the patient today.  Clinically appears reasonably well although complains of intermittent fevers and periodic confusion.  Physical examination revealed an alert and oriented gentleman without distress.  Erythema noted around the nephrostomy tube on the left side.  No drainage or pus noted.  No tachycardia.  Pulmonary examination revealed clear lungs.  The plan is to complete a course of antibiotics utilizing amoxicillin for the next  2 weeks.  We will assess the need for long-term nephrostomy tube specifically on the left side.  Zola Button MD 04/04/18

## 2018-04-03 ENCOUNTER — Encounter: Payer: Self-pay | Admitting: Medical

## 2018-04-04 ENCOUNTER — Telehealth: Payer: Self-pay | Admitting: *Deleted

## 2018-04-04 ENCOUNTER — Telehealth: Payer: Self-pay | Admitting: Family Medicine

## 2018-04-04 DIAGNOSIS — D631 Anemia in chronic kidney disease: Secondary | ICD-10-CM | POA: Diagnosis not present

## 2018-04-04 DIAGNOSIS — Z436 Encounter for attention to other artificial openings of urinary tract: Secondary | ICD-10-CM | POA: Diagnosis not present

## 2018-04-04 DIAGNOSIS — B9562 Methicillin resistant Staphylococcus aureus infection as the cause of diseases classified elsewhere: Secondary | ICD-10-CM | POA: Diagnosis not present

## 2018-04-04 DIAGNOSIS — C679 Malignant neoplasm of bladder, unspecified: Secondary | ICD-10-CM | POA: Diagnosis not present

## 2018-04-04 DIAGNOSIS — N390 Urinary tract infection, site not specified: Secondary | ICD-10-CM | POA: Diagnosis not present

## 2018-04-04 DIAGNOSIS — I5023 Acute on chronic systolic (congestive) heart failure: Secondary | ICD-10-CM | POA: Diagnosis not present

## 2018-04-04 DIAGNOSIS — I130 Hypertensive heart and chronic kidney disease with heart failure and stage 1 through stage 4 chronic kidney disease, or unspecified chronic kidney disease: Secondary | ICD-10-CM | POA: Diagnosis not present

## 2018-04-04 DIAGNOSIS — I2510 Atherosclerotic heart disease of native coronary artery without angina pectoris: Secondary | ICD-10-CM | POA: Diagnosis not present

## 2018-04-04 DIAGNOSIS — Z466 Encounter for fitting and adjustment of urinary device: Secondary | ICD-10-CM | POA: Diagnosis not present

## 2018-04-04 DIAGNOSIS — Z433 Encounter for attention to colostomy: Secondary | ICD-10-CM | POA: Diagnosis not present

## 2018-04-04 NOTE — Telephone Encounter (Signed)
Please okay the order

## 2018-04-04 NOTE — Telephone Encounter (Signed)
I filed out as much as I could for the [place]. Placed in Dr. Barbie Banner RED folder for signature.

## 2018-04-04 NOTE — Telephone Encounter (Signed)
Copied from [place] [id number]. Topic: Quick Communication - See Telephone Encounter >> Apr 04, 2018  9:44 AM Cleaster Corin, NT wrote: CRM for notification. See Telephone encounter for: 04/04/18.  Pt. Wife calling to see what to do about getting a handicap sticker being with pts. Health. Pt. And wife going out of town on Wednesday seeing if this can be resolved.

## 2018-04-04 NOTE — Telephone Encounter (Signed)
Spoke with wife lennie, she requests a handicap sticker for her husband. Will have signed form at the front desk for p/u 04/05/18

## 2018-04-04 NOTE — Telephone Encounter (Signed)
Form has been completed. Called pt and left a VM that form has been completed for disability parking place card. Form has been placed up front for pick up. Advised pt to call back if needed and left our call back number.

## 2018-04-04 NOTE — Telephone Encounter (Signed)
Called and left VM for the OK for verbal orders for the pt. Left pt's name and DOB advised them to call back if needed and left our call back number.

## 2018-04-05 ENCOUNTER — Telehealth: Payer: Self-pay | Admitting: *Deleted

## 2018-04-05 ENCOUNTER — Other Ambulatory Visit (HOSPITAL_COMMUNITY): Payer: Medicare HMO

## 2018-04-05 ENCOUNTER — Other Ambulatory Visit: Payer: Self-pay | Admitting: Internal Medicine

## 2018-04-05 NOTE — Telephone Encounter (Signed)
Patient's wife lennie in today to p/u handicap paperwork

## 2018-04-05 NOTE — Telephone Encounter (Signed)
Done

## 2018-04-05 NOTE — Telephone Encounter (Signed)
No note

## 2018-04-06 ENCOUNTER — Other Ambulatory Visit (HOSPITAL_COMMUNITY): Payer: Medicare HMO

## 2018-04-07 ENCOUNTER — Telehealth: Payer: Self-pay | Admitting: Medical

## 2018-04-08 ENCOUNTER — Other Ambulatory Visit: Payer: Self-pay | Admitting: Medical

## 2018-04-08 ENCOUNTER — Inpatient Hospital Stay: Payer: Medicare HMO

## 2018-04-08 ENCOUNTER — Ambulatory Visit (HOSPITAL_COMMUNITY)
Admission: RE | Admit: 2018-04-08 | Discharge: 2018-04-08 | Disposition: A | Discharge status: Home / Self Care | Payer: Medicare HMO | Source: Ambulatory Visit | Attending: Medical | Admitting: Medical

## 2018-04-08 ENCOUNTER — Inpatient Hospital Stay (HOSPITAL_BASED_OUTPATIENT_CLINIC_OR_DEPARTMENT_OTHER): Payer: Medicare HMO | Admitting: Medical

## 2018-04-08 VITALS — BP 118/58 | HR 85 | Temp 98.2°F | Resp 18 | Ht 68.0 in | Wt 180.6 lb

## 2018-04-08 DIAGNOSIS — R059 Cough, unspecified: Secondary | ICD-10-CM

## 2018-04-08 DIAGNOSIS — R410 Disorientation, unspecified: Secondary | ICD-10-CM

## 2018-04-08 DIAGNOSIS — M791 Myalgia, unspecified site: Secondary | ICD-10-CM

## 2018-04-08 DIAGNOSIS — R0609 Other forms of dyspnea: Secondary | ICD-10-CM

## 2018-04-08 DIAGNOSIS — C679 Malignant neoplasm of bladder, unspecified: Secondary | ICD-10-CM | POA: Diagnosis not present

## 2018-04-08 DIAGNOSIS — M2550 Pain in unspecified joint: Secondary | ICD-10-CM | POA: Diagnosis not present

## 2018-04-08 DIAGNOSIS — R509 Fever, unspecified: Secondary | ICD-10-CM | POA: Diagnosis not present

## 2018-04-08 DIAGNOSIS — D649 Anemia, unspecified: Secondary | ICD-10-CM | POA: Diagnosis not present

## 2018-04-08 DIAGNOSIS — R918 Other nonspecific abnormal finding of lung field: Secondary | ICD-10-CM | POA: Diagnosis not present

## 2018-04-08 DIAGNOSIS — R05 Cough: Secondary | ICD-10-CM | POA: Insufficient documentation

## 2018-04-08 DIAGNOSIS — C7951 Secondary malignant neoplasm of bone: Secondary | ICD-10-CM

## 2018-04-08 DIAGNOSIS — D6959 Other secondary thrombocytopenia: Secondary | ICD-10-CM | POA: Diagnosis not present

## 2018-04-08 DIAGNOSIS — Z936 Other artificial openings of urinary tract status: Secondary | ICD-10-CM | POA: Diagnosis not present

## 2018-04-08 DIAGNOSIS — R4182 Altered mental status, unspecified: Secondary | ICD-10-CM

## 2018-04-08 LAB — CMP (CANCER CENTER ONLY)
ALBUMIN: 3.2 g/dL — AB (ref 3.5–5.0)
ALK PHOS: 65 U/L (ref 40–150)
ALT: 10 U/L (ref 0–55)
AST: 11 U/L (ref 5–34)
Anion gap: 8 (ref 3–11)
BUN: 17 mg/dL (ref 7–26)
CHLORIDE: 104 mmol/L (ref 98–109)
CO2: 22 mmol/L (ref 22–29)
CREATININE: 1.39 mg/dL — AB (ref 0.70–1.30)
Calcium: 9.5 mg/dL (ref 8.4–10.4)
GFR, EST AFRICAN AMERICAN: 53 mL/min — AB (ref >60)
GFR, Estimated: 46 mL/min — ABNORMAL LOW (ref >60)
GLUCOSE: 94 mg/dL (ref 70–140)
Potassium: 4.3 mmol/L (ref 3.5–5.1)
SODIUM: 134 mmol/L — AB (ref 136–145)
Total Bilirubin: 0.4 mg/dL (ref 0.2–1.2)
Total Protein: 6.8 g/dL (ref 6.4–8.3)

## 2018-04-08 LAB — CBC WITH DIFFERENTIAL (CANCER CENTER ONLY)
Basophils Absolute: 0 10*3/uL (ref 0.0–0.1)
Basophils Relative: 0 %
EOS ABS: 1.2 10*3/uL — AB (ref 0.0–0.5)
Eosinophils Relative: 23 %
HCT: 25.3 % — ABNORMAL LOW (ref 38.4–49.9)
HEMOGLOBIN: 8.4 g/dL — AB (ref 13.0–17.1)
LYMPHS ABS: 1.1 10*3/uL (ref 0.9–3.3)
Lymphocytes Relative: 22 %
MCH: 32.7 pg (ref 27.2–33.4)
MCHC: 33.2 g/dL (ref 32.0–36.0)
MCV: 98.4 fL — ABNORMAL HIGH (ref 79.3–98.0)
MONO ABS: 0.4 10*3/uL (ref 0.1–0.9)
MONOS PCT: 7 %
NEUTROS ABS: 2.5 10*3/uL (ref 1.5–6.5)
NEUTROS PCT: 48 %
Platelet Count: 67 10*3/uL — ABNORMAL LOW (ref 140–400)
RBC: 2.57 MIL/uL — ABNORMAL LOW (ref 4.20–5.82)
RDW: 14.9 % — AB (ref 11.0–14.6)
WBC Count: 5.2 10*3/uL (ref 4.0–10.3)

## 2018-04-08 NOTE — Progress Notes (Signed)
[facility] Progress Note   Charles Coffey [id number] Dec 27, 1935 [address].o. Dr. Kris Hartmann is managed by Dr. [name]  Actively treated with chemotherapy: yes  Current Therapy: Keytruda  Last Treated: 03/23/2018 (cycle 1, day 1)  Assessment: Plan:    Cough  Arthralgia, unspecified joint  Altered mental status, unspecified altered mental status type - Plan: MR Brain Wo Contrast   Cough: Dr. [name] explained to the patient and his family that the year was the possibility that his cough was caused by pneumonitis precipitated by Beryle Flock.  He defers initiating a steroid at this time based on the subtlety of the patient's symptoms.  Mental status changes: An MRI of the brain has been ordered and has been scheduled for Monday, 04/11/2018.  [name] will see Dr. [name] in follow-up on 04/13/2018.  Please see After Visit Summary for patient specific instructions.  Future Appointments  Date Time Provider [place]  04/11/2018 10:00 AM [facility] 1 [facility] [place]  04/11/2018  1:30 PM [facility]IR 1 [facility] [place]  04/13/2018  8:30 AM [facility] LAB 6 [facility] None  04/13/2018  8:45 AM [facility] FLUSH NURSE 2 [facility] None  04/13/2018  9:15 AM Shadad, Mathis Dad, MD [facility] None  04/13/2018 10:15 AM [facility] D11 [facility] None    Orders Placed This Encounter  Procedures  . MR Brain Wo Contrast       Subjective:   Patient ID:  Charles Coffey is a 83 y.o. (DOB 1935-07-23) male.  Chief Complaint: No chief complaint on file.   HPI FREDERICK Coffey is an 83 year old male with a history of a stage IV urothelial carcinoma. He was most recently treated with cycle 1, day 1 of Keytruda which was dosed on 03/23/2018.  He is managed by Dr. [name].  [name] has bilateral nephrostomy tubes.  These are pending replacement on 04/11/2018.  He continues to have decreased output to the left nephrostomy tube.  He was last seen in our clinic on  03/31/2018 after having fevers of up to 100.6. [name] also noted mental status changes when he has fevers.  Antibiotics were restarted at that visit with the patient given Augmentin 875-125 twice daily for at least 2 weeks with an ample number given to continue up to 3 weeks if needed. Mr. Aguilera was planning to travel to [place] with a family friend earlier this next week for his birthday. This provider received a call from the patient's wife yesterday stating that the patient was not feeling well.  He had developed a cough, nausea and was having generalized arthralgias.  He had elected to return from his trip early.  He returned home last evening.  He presents to the office today to be evaluated.  His CBC returned with a WBC of 5.2, hemoglobin of 8.4, hematocrit at 25.3, and platelet count stable at 67.  Chest x-ray was also completed which showed peribronchial thickening and interstitial prominence which was thought to possibly reflect interstitial edema or bronchitis.  [name] wife reports that he has had an increase in a nonproductive cough.  He has had a fever of up to 100.  He continues on Augmentin.  He has been having bladder spasms.  She had reported at his last visit that he was having episodes of confusion which she initially thought were occurring at the time of fevers.  Last evening she reported that his confusion was worse, he was aggressive at times, and was making statements that  were nonsensical.  He appeared to be anxious at times last evening, reported a headache, was having episodes of dizziness, and was hyperventilating.  Medications: I have reviewed the patient's current medications.  Allergies:  Allergies  Allergen Reactions  . Antihistamines, Loratadine-Type Other (See Comments)    Unable to urinate  . Statins Other (See Comments)    liver effects    Past Medical History:  Diagnosis Date  . Aortic atherosclerosis ([place])   . BPH with urinary obstruction   . CAD  (coronary artery disease)    a.  MI 1995, CABG x 3 2002 (patient says that he had LIMA and RIMA grafts). b. ETT-Cardiolite (10/15) with EF 48%, apical scar, no ischemia. c. Nuc 07/2017 abnormal -> cath was performed,  patent LIMA to LAD and SVG to OM 2. RIMA to RCA is atretic. The right coronary artery is occluded distally with extensive collaterals from the LAD, medical therapy.   . Cancer [facility])    bladder cancer  . Cervical spondylosis without myelopathy December 30, 202017  . Chronic low back pain    sees Dr. Kary Kos   . GERD (gastroesophageal reflux disease)   . Hiatal hernia   . Hyperlipidemia   . Hypertension   . IBS (irritable bowel syndrome)   . Ischemic cardiomyopathy   . Myocardial infarction ([place])   . RBBB   . Restless legs syndrome   . Statin intolerance   . Syncope    a. in 2015 - no apparent cause, was taking sleep medicine at the time. Cardiac workup unremarkable.  . Tubular adenoma of colon     Past Surgical History:  Procedure Laterality Date  . COLONOSCOPY  10/17/2014   per Dr. Hilarie Fredrickson, tubular adenomas, repeat in 3 yrs   . COLONOSCOPY WITH PROPOFOL N/A 12/01/2017   Procedure: COLONOSCOPY WITH PROPOFOL;  Surgeon: Milus Banister, MD;  [facility];  Service: Endoscopy;  Laterality: N/A;  . CYSTOSCOPY W/ RETROGRADES Left 11/25/2017   Procedure: CYSTOSCOPY WITH RETROGRADE PYELOGRAM;  Surgeon: Irine Seal, MD;  [facility];  Service: Urology;  Laterality: Left;  . HEART BYPASS    . IR CONVERT RIGHT NEPHROSTOMY TO NEPHROURETERAL CATH  02/04/2018  . IR FLUORO GUIDE CV LINE RIGHT  12/27/2017  . IR NEPHROSTOGRAM LEFT THRU EXISTING ACCESS  12/24/2017  . IR NEPHROSTOMY EXCHANGE LEFT  01/28/2018  . IR NEPHROSTOMY EXCHANGE LEFT  02/04/2018  . IR NEPHROSTOMY EXCHANGE LEFT  02/25/2018  . IR NEPHROSTOMY EXCHANGE RIGHT  12/24/2017  . IR NEPHROSTOMY EXCHANGE RIGHT  01/28/2018  . IR NEPHROSTOMY PLACEMENT LEFT  11/28/2017  . IR NEPHROSTOMY PLACEMENT RIGHT  12/03/2017  . IR PATIENT  EVAL TECH 0-60 MINS  03/24/2018  . IR PATIENT EVAL TECH 0-60 MINS  03/28/2018  . IR US GUIDE VASC ACCESS RIGHT  12/27/2017  . LAPAROSCOPY N/A 12/01/2017   Procedure: LAPAROSCOPIC DIVERTING OSTOMY;  Surgeon: Stark Klein, MD;  [facility];  Service: General;  Laterality: N/A;  . LEFT HEART CATH AND CORS/GRAFTS ANGIOGRAPHY N/A 08/25/2017   Procedure: LEFT HEART CATH AND CORS/GRAFTS ANGIOGRAPHY;  Surgeon: Wellington Hampshire, MD;  Location: [place] CV LAB;  Service: Cardiovascular;  Laterality: N/A;  . LUMBAR FUSION  2003   L3-L4  . PROSTATE SURGERY  06-27-12   per Dr. Roni Bread, had CTT  . TONSILLECTOMY    . TRANSURETHRAL RESECTION OF BLADDER TUMOR N/A 11/25/2017   Procedure: TRANSURETHRAL RESECTION OF BLADDER TUMOR (TURBT);  Surgeon: Irine Seal, MD;  [facility];  Service: Urology;  Laterality: N/A;    Family History  Problem Relation Age of Onset  . Heart attack Mother   . Aneurysm Father        femoral artery  . Heart disease Brother   . Heart disease Maternal Uncle        x 2  . Colon cancer Neg Hx   . Esophageal cancer Neg Hx   . Pancreatic cancer Neg Hx   . Kidney disease Neg Hx   . Liver disease Neg Hx     Social History   Socioeconomic History  . Marital status: Married    Spouse name: Not on file  . Number of children: 5  . Years of education: Some coll  . Highest education level: Not on file  Occupational History  . Occupation: retired  Scientific laboratory technician  . Financial resource strain: Not on file  . Food insecurity:    Worry: Not on file    Inability: Not on file  . Transportation needs:    Medical: Not on file    Non-medical: Not on file  Tobacco Use  . Smoking status: Former Smoker    Types: Cigarettes    Last attempt to quit: 12/28/1978    Years since quitting: 39.3  . Smokeless tobacco: Never Used  Substance and Sexual Activity  . Alcohol use: Yes    Alcohol/week: 1.2 oz    Types: 1 Glasses of wine, 1 Cans of beer per week    Comment: occassional  .  Drug use: No  . Sexual activity: Not on file  Lifestyle  . Physical activity:    Days per week: Not on file    Minutes per session: Not on file  . Stress: Not on file  Relationships  . Social connections:    Talks on phone: Not on file    Gets together: Not on file    Attends religious service: Not on file    Active member of club or organization: Not on file    Attends meetings of clubs or organizations: Not on file    Relationship status: Not on file  . Intimate partner violence:    Fear of current or ex partner: Not on file    Emotionally abused: Not on file    Physically abused: Not on file    Forced sexual activity: Not on file  Other Topics Concern  . Not on file  Social History Narrative   Lives at home w/ his wife   Right-handed   5 cups of coffee per day    Past Medical History, Surgical history, Social history, and Family history were reviewed and updated as appropriate.   Please see review of systems for further details on the patient's review from today.   Review of Systems:  Review of Systems  Constitutional: Positive for fever. Negative for activity change, appetite change, chills and diaphoresis.  HENT: Negative for trouble swallowing.   Respiratory: Positive for cough. Negative for choking, chest tightness, shortness of breath and wheezing.   Cardiovascular: Negative for chest pain and palpitations.  Gastrointestinal: Negative for constipation, diarrhea, nausea and vomiting.  Musculoskeletal: Negative for back pain.  Neurological: Positive for dizziness and headaches.  Psychiatric/Behavioral: Positive for agitation, behavioral problems and confusion. The patient is nervous/anxious.     Objective:   Physical Exam:  BP (!) 118/58 (BP Location: Left Arm, Patient Position: Sitting) Comment: Nurse aware  Pulse 85   Temp 98.2 F (36.8 C) (Oral)   Resp  18   Ht 5\' 8"  (1.727 m)   Wt 180 lb 9.6 oz (81.9 kg)   SpO2 100%   BMI 27.46 kg/m  ECOG:  1  Physical Exam  Constitutional: No distress.  HENT:  Head: Normocephalic and atraumatic.  Mouth/Throat: No oropharyngeal exudate.  Eyes: Right eye exhibits no discharge. Left eye exhibits no discharge.  Cardiovascular: Normal rate, regular rhythm and normal heart sounds. Exam reveals no gallop and no friction rub.  No murmur heard. Pulmonary/Chest: Effort normal and breath sounds normal. No respiratory distress. He has no wheezes. He has no rales.  Abdominal: Soft. Bowel sounds are normal. He exhibits no distension. There is no tenderness. There is no rebound and no guarding.    Musculoskeletal:       Arms: Neurological: Coordination normal.  Skin: Skin is warm and dry. No rash noted. He is not diaphoretic. No erythema.    Lab Review:     Component Value Date/Time   NA 134 (L) 04/08/2018 1033   NA 138 02/24/2018 1646   NA 133 (L) 12/29/2017 1014   K 4.3 04/08/2018 1033   K 4.4 12/29/2017 1014   CL 104 04/08/2018 1033   CO2 22 04/08/2018 1033   CO2 20 (L) 12/29/2017 1014   GLUCOSE 94 04/08/2018 1033   GLUCOSE 102 12/29/2017 1014   BUN 17 04/08/2018 1033   BUN 25 02/24/2018 1646   BUN 31.5 (H) 12/29/2017 1014   CREATININE 1.39 (H) 04/08/2018 1033   CREATININE 1.6 (H) 12/29/2017 1014   CALCIUM 9.5 04/08/2018 1033   CALCIUM 9.6 12/29/2017 1014   PROT 6.8 04/08/2018 1033   PROT 6.8 12/29/2017 1014   ALBUMIN 3.2 (L) 04/08/2018 1033   ALBUMIN 3.8 12/29/2017 1014   AST 11 04/08/2018 1033   AST 15 12/29/2017 1014   ALT 10 04/08/2018 1033   ALT 13 12/29/2017 1014   ALKPHOS 65 04/08/2018 1033   ALKPHOS 61 12/29/2017 1014   BILITOT 0.4 04/08/2018 1033   BILITOT 0.43 12/29/2017 1014   GFRNONAA 46 (L) 04/08/2018 1033   GFRAA 53 (L) 04/08/2018 1033       Component Value Date/Time   WBC 5.2 04/08/2018 1033   WBC 7.4 02/16/2018 2336   RBC 2.57 (L) 04/08/2018 1033   HGB 8.3 (L) 02/16/2018 2336   HGB 8.8 (L) 12/29/2017 1014   HCT 25.3 (L) 04/08/2018 1033   HCT 26.0 (L)  12/29/2017 1014   PLT 67 (L) 04/08/2018 1033   PLT 70 (L) 12/29/2017 1014   PLT 115 (L) 08/31/2017 1135   MCV 98.4 (H) 04/08/2018 1033   MCV 89.3 12/29/2017 1014   MCH 32.7 04/08/2018 1033   MCHC 33.2 04/08/2018 1033   RDW 14.9 (H) 04/08/2018 1033   RDW 15.5 (H) 12/29/2017 1014   LYMPHSABS 1.1 04/08/2018 1033   LYMPHSABS 1.4 12/29/2017 1014   MONOABS 0.4 04/08/2018 1033   MONOABS 0.6 12/29/2017 1014   EOSABS 1.2 (H) 04/08/2018 1033   EOSABS 0.2 12/29/2017 1014   BASOSABS 0.0 04/08/2018 1033   BASOSABS 0.0 12/29/2017 1014   -------------------------------  Imaging from last 24 hours (if applicable):  Radiology interpretation: Ct Abdomen Pelvis Wo Contrast  Result Date: 03/10/2018 CLINICAL DATA:  Bladder cancer.  Follow-up. EXAM: CT CHEST, ABDOMEN AND PELVIS WITHOUT CONTRAST TECHNIQUE: Multidetector CT imaging of the chest, abdomen and pelvis was performed following the standard protocol without IV contrast. COMPARISON:  CT AP 01/01/2018. FINDINGS: CT CHEST FINDINGS Cardiovascular: The heart size appears normal. Aortic atherosclerosis.  Calcification within the LAD, left circumflex and RCA coronary arteries noted. No pericardial effusion. Mediastinum/Nodes: Normal appearance of the thyroid gland. The trachea appears patent and is midline. Normal appearance of the esophagus. No enlarged mediastinal or hilar lymph nodes. Lungs/Pleura: No pleural effusion. Scattered ground-glass and tree-in-bud nodules within the upper lung zones are noted, likely post infectious/inflammatory. A few scattered solid nodules are noted in both lungs. Index nodule within the left upper lobe measures 2 mm, image 39/6. Anterior right upper lobe lung nodule measures 2 mm, image 58/series 6. Nonspecific right middle lobe lung nodule measures 4 mm. Also in the right middle lobe is a 4 mm lung nodule, image 89/6. 3 mm right middle lobe lung nodule noted. In the posterior left lung base there is a 5 mm lung nodule, image  136/6. Musculoskeletal: There is mild scoliosis and degenerative disc disease identified no suspicious bone lesions. CT ABDOMEN PELVIS FINDINGS Hepatobiliary: No focal liver abnormality is seen. No gallstones, gallbladder wall thickening, or biliary dilatation. Pancreas: Unremarkable. No pancreatic ductal dilatation or surrounding inflammatory changes. Spleen: Normal in size without focal abnormality. Adrenals/Urinary Tract: The adrenal glands appear normal. Bilateral percutaneous nephrostomy tubes are identified. A right-sided nephroureteral stent is also in place. No hydronephrosis. Asymmetric atrophy of the left kidney compared with the right. Soft tissue nodularity along the course of the left ureter is again noted. Similar to previous exam. Also similar is irregular nodular thickening of Gerota's fascia on the left. There is asymmetric soft tissue thickening involving the left posterior bladder wall and distal left ureter. This is difficult to assess without IV contrast material and with a partially decompressed urinary bladder. This measures approximately 5.0 x 2.9 cm, image 107/2. Previously 4.7 x 4.4 cm. Mild diffuse bladder wall thickening. Similar to previous exam. Stomach/Bowel: The stomach appears normal. The small bowel loops have a normal course and caliber without obstruction. Left abdominal wall double barrel colostomy identified. No pathologic dilatation of the large bowel. Abnormal wall thickening involving the rectum is again identified, image 112 of series 2. Surrounding increased soft tissue is noted which is stable. Vascular/Lymphatic: Aortic atherosclerosis noted. No aneurysm. Similar appearance of mild nodularity between the abdominal aorta and left ureter. No discrete measurable adenopathy within the abdomen or pelvis. Reproductive: Stable prostate gland enlargement. Other: No free fluid or fluid collections. Musculoskeletal: No suspicious bone lesions. Scoliosis. Previous posterior hardware  fixation of the lower lumbar spine. IMPRESSION: 1. Similar appearance of abnormal asymmetric increased soft tissue within the left posterior bladder base encasing the distal left ureter. 2. Stable appearance of soft tissue nodularity along the course of the left ureter and in between the left kidney and left side of aorta. Nodularity involving the Gerota's fascia on the left is also similar. 3. Stable appearance of small pulmonary nodules. 4. Status post bilateral percutaneous nephrostomy tube placement with decompression of previous hydronephrosis. 5. Stable soft tissue thickening of the rectum. 6. Aortic Atherosclerosis (ICD10-I70.0). Coronary artery atherosclerotic calcifications noted. Electronically Signed   By: Kerby Moors M.D.   On: 03/10/2018 15:30   Dg Chest 2 View  Result Date: 04/08/2018 CLINICAL DATA:  Cough, myalgia EXAM: CHEST - 2 VIEW COMPARISON:  03/10/2018 CT.  Chest x-ray 01/31/2018 FINDINGS: Right Port-A-Cath remains in place, unchanged. Prior median sternotomy and CABG. Mild cardiomegaly. Mild peribronchial thickening and interstitial prominence. No effusions. No acute bony abnormality. IMPRESSION: . Peribronchial thickening and interstitial prominence could reflect interstitial edema or bronchitis. Electronically Signed   By: Rolm Baptise M.D.  On: 04/08/2018 10:10   Ct Chest Wo Contrast  Result Date: 03/10/2018 CLINICAL DATA:  Bladder cancer.  Follow-up. EXAM: CT CHEST, ABDOMEN AND PELVIS WITHOUT CONTRAST TECHNIQUE: Multidetector CT imaging of the chest, abdomen and pelvis was performed following the standard protocol without IV contrast. COMPARISON:  CT AP 01/01/2018. FINDINGS: CT CHEST FINDINGS Cardiovascular: The heart size appears normal. Aortic atherosclerosis. Calcification within the LAD, left circumflex and RCA coronary arteries noted. No pericardial effusion. Mediastinum/Nodes: Normal appearance of the thyroid gland. The trachea appears patent and is midline. Normal  appearance of the esophagus. No enlarged mediastinal or hilar lymph nodes. Lungs/Pleura: No pleural effusion. Scattered ground-glass and tree-in-bud nodules within the upper lung zones are noted, likely post infectious/inflammatory. A few scattered solid nodules are noted in both lungs. Index nodule within the left upper lobe measures 2 mm, image 39/6. Anterior right upper lobe lung nodule measures 2 mm, image 58/series 6. Nonspecific right middle lobe lung nodule measures 4 mm. Also in the right middle lobe is a 4 mm lung nodule, image 89/6. 3 mm right middle lobe lung nodule noted. In the posterior left lung base there is a 5 mm lung nodule, image 136/6. Musculoskeletal: There is mild scoliosis and degenerative disc disease identified no suspicious bone lesions. CT ABDOMEN PELVIS FINDINGS Hepatobiliary: No focal liver abnormality is seen. No gallstones, gallbladder wall thickening, or biliary dilatation. Pancreas: Unremarkable. No pancreatic ductal dilatation or surrounding inflammatory changes. Spleen: Normal in size without focal abnormality. Adrenals/Urinary Tract: The adrenal glands appear normal. Bilateral percutaneous nephrostomy tubes are identified. A right-sided nephroureteral stent is also in place. No hydronephrosis. Asymmetric atrophy of the left kidney compared with the right. Soft tissue nodularity along the course of the left ureter is again noted. Similar to previous exam. Also similar is irregular nodular thickening of Gerota's fascia on the left. There is asymmetric soft tissue thickening involving the left posterior bladder wall and distal left ureter. This is difficult to assess without IV contrast material and with a partially decompressed urinary bladder. This measures approximately 5.0 x 2.9 cm, image 107/2. Previously 4.7 x 4.4 cm. Mild diffuse bladder wall thickening. Similar to previous exam. Stomach/Bowel: The stomach appears normal. The small bowel loops have a normal course and caliber  without obstruction. Left abdominal wall double barrel colostomy identified. No pathologic dilatation of the large bowel. Abnormal wall thickening involving the rectum is again identified, image 112 of series 2. Surrounding increased soft tissue is noted which is stable. Vascular/Lymphatic: Aortic atherosclerosis noted. No aneurysm. Similar appearance of mild nodularity between the abdominal aorta and left ureter. No discrete measurable adenopathy within the abdomen or pelvis. Reproductive: Stable prostate gland enlargement. Other: No free fluid or fluid collections. Musculoskeletal: No suspicious bone lesions. Scoliosis. Previous posterior hardware fixation of the lower lumbar spine. IMPRESSION: 1. Similar appearance of abnormal asymmetric increased soft tissue within the left posterior bladder base encasing the distal left ureter. 2. Stable appearance of soft tissue nodularity along the course of the left ureter and in between the left kidney and left side of aorta. Nodularity involving the Gerota's fascia on the left is also similar. 3. Stable appearance of small pulmonary nodules. 4. Status post bilateral percutaneous nephrostomy tube placement with decompression of previous hydronephrosis. 5. Stable soft tissue thickening of the rectum. 6. Aortic Atherosclerosis (ICD10-I70.0). Coronary artery atherosclerotic calcifications noted. Electronically Signed   By: Kerby Moors M.D.   On: 03/10/2018 15:30   Ir Patient Eval Tech 0-60 Mins  Result Date: 03/28/2018  Chipper Oman     03/28/2018 10:35 AM Patient came in today with complaint of nephrostomy bag leaking at the seam.  I exchanged the bag and educated the patient and his wife how to do it at home.  I sent them home with two additional bags in case it happens again.  They said they understood how to change the bag and would call if there are any other issues.  Ir Patient Eval Tech 0-60 Mins  Result Date: 03/24/2018 Chipper Oman     03/24/2018 10:15 AM  Patient came in today with pain and redness at bilateral nephrostomy insertion sites.  Left side had greater pain.  Evaluation of skin site did show that the left side had a slight skin tear secondary to suture/ catheter being pulled.  Per patient and family the right side pain had been resolved after dressing change this morning.  It was noted that the catheter and bags had very little slack and were pulling at the insertion sites.  Rowe Robert PAC did review the site additionally and removed the left sided suture.  The right side suture had already been removed during the last tube change by the radiologist.  The patient's left sided pain was immediately resolved with suture removal.  The leg bags were taped so the cathetesr had no tension at the insertion site.  No antibiotics were warranted at this time.   The patient and family were satisfied and advised to call Ir[facility]with any additional issues       This patient was seen with Dr. [name] with my treatment plan reviewed with him. He expressed agreement with my medical management of this patient.

## 2018-04-11 ENCOUNTER — Ambulatory Visit (HOSPITAL_COMMUNITY)
Admission: RE | Admit: 2018-04-11 | Discharge: 2018-04-11 | Disposition: A | Discharge status: Home / Self Care | Payer: Medicare HMO | Source: Ambulatory Visit | Attending: Medical | Admitting: Medical

## 2018-04-11 ENCOUNTER — Ambulatory Visit (HOSPITAL_COMMUNITY)
Admission: RE | Admit: 2018-04-11 | Discharge: 2018-04-11 | Disposition: A | Discharge status: Home / Self Care | Payer: Medicare HMO | Source: Ambulatory Visit | Attending: Interventional Radiology | Admitting: Interventional Radiology

## 2018-04-11 ENCOUNTER — Other Ambulatory Visit (HOSPITAL_COMMUNITY): Payer: Self-pay | Admitting: Interventional Radiology

## 2018-04-11 ENCOUNTER — Encounter (HOSPITAL_COMMUNITY): Payer: Self-pay | Admitting: Interventional Radiology

## 2018-04-11 DIAGNOSIS — G319 Degenerative disease of nervous system, unspecified: Secondary | ICD-10-CM | POA: Diagnosis not present

## 2018-04-11 DIAGNOSIS — C679 Malignant neoplasm of bladder, unspecified: Secondary | ICD-10-CM

## 2018-04-11 DIAGNOSIS — I6782 Cerebral ischemia: Secondary | ICD-10-CM | POA: Insufficient documentation

## 2018-04-11 DIAGNOSIS — R4182 Altered mental status, unspecified: Secondary | ICD-10-CM | POA: Diagnosis not present

## 2018-04-11 HISTORY — PX: IR NEPHROSTOMY EXCHANGE RIGHT: IMG6070

## 2018-04-11 HISTORY — PX: IR EXT NEPHROURETERAL CATH EXCHANGE: IMG5418

## 2018-04-11 MED ORDER — IOPAMIDOL (ISOVUE-300) INJECTION 61%
50.0000 mL | Freq: Once | INTRAVENOUS | Status: AC | PRN
  Administered 2018-04-11: 20 mL

## 2018-04-11 MED ORDER — LIDOCAINE HCL 1 % IJ SOLN
INTRAMUSCULAR | Status: DC | PRN
  Administered 2018-04-11: 5 mL via INTRADERMAL

## 2018-04-11 MED ORDER — IOPAMIDOL (ISOVUE-300) INJECTION 61%
INTRAVENOUS | Status: AC
  Administered 2018-04-11: 20 mL
  Filled 2018-04-11: qty 50

## 2018-04-11 MED ORDER — GADOBENATE DIMEGLUMINE 529 MG/ML IV SOLN: 20.0000 mL | Freq: Once | INTRAVENOUS | Status: DC | PRN

## 2018-04-11 MED ORDER — LIDOCAINE HCL 1 % IJ SOLN
INTRAMUSCULAR | Status: AC
  Filled 2018-04-11: qty 20

## 2018-04-11 NOTE — Progress Notes (Signed)
These preliminary result these preliminary results were noted.  Awaiting final report.

## 2018-04-12 ENCOUNTER — Ambulatory Visit (HOSPITAL_COMMUNITY): Payer: Medicare HMO

## 2018-04-12 ENCOUNTER — Other Ambulatory Visit: Payer: Self-pay | Admitting: Family Medicine

## 2018-04-12 NOTE — Telephone Encounter (Signed)
Noted. Charles Coffey

## 2018-04-13 ENCOUNTER — Inpatient Hospital Stay: Payer: Medicare HMO

## 2018-04-13 ENCOUNTER — Telehealth: Payer: Self-pay | Admitting: Oncology

## 2018-04-13 ENCOUNTER — Encounter: Payer: Self-pay | Admitting: Neurology

## 2018-04-13 ENCOUNTER — Inpatient Hospital Stay: Payer: Medicare HMO | Admitting: Oncology

## 2018-04-13 VITALS — BP 116/55 | HR 75 | Temp 97.7°F | Resp 18 | Ht 68.0 in | Wt 172.1 lb

## 2018-04-13 DIAGNOSIS — N1330 Unspecified hydronephrosis: Secondary | ICD-10-CM | POA: Diagnosis not present

## 2018-04-13 DIAGNOSIS — C786 Secondary malignant neoplasm of retroperitoneum and peritoneum: Secondary | ICD-10-CM | POA: Diagnosis not present

## 2018-04-13 DIAGNOSIS — Z95828 Presence of other vascular implants and grafts: Secondary | ICD-10-CM

## 2018-04-13 DIAGNOSIS — C785 Secondary malignant neoplasm of large intestine and rectum: Secondary | ICD-10-CM | POA: Diagnosis not present

## 2018-04-13 DIAGNOSIS — R0609 Other forms of dyspnea: Secondary | ICD-10-CM | POA: Diagnosis not present

## 2018-04-13 DIAGNOSIS — R410 Disorientation, unspecified: Secondary | ICD-10-CM | POA: Diagnosis not present

## 2018-04-13 DIAGNOSIS — M2550 Pain in unspecified joint: Secondary | ICD-10-CM | POA: Diagnosis not present

## 2018-04-13 DIAGNOSIS — D649 Anemia, unspecified: Secondary | ICD-10-CM | POA: Diagnosis not present

## 2018-04-13 DIAGNOSIS — Z936 Other artificial openings of urinary tract status: Secondary | ICD-10-CM | POA: Diagnosis not present

## 2018-04-13 DIAGNOSIS — G893 Neoplasm related pain (acute) (chronic): Secondary | ICD-10-CM | POA: Diagnosis not present

## 2018-04-13 DIAGNOSIS — R630 Anorexia: Secondary | ICD-10-CM

## 2018-04-13 DIAGNOSIS — R509 Fever, unspecified: Secondary | ICD-10-CM | POA: Diagnosis not present

## 2018-04-13 DIAGNOSIS — C7951 Secondary malignant neoplasm of bone: Secondary | ICD-10-CM

## 2018-04-13 DIAGNOSIS — I82409 Acute embolism and thrombosis of unspecified deep veins of unspecified lower extremity: Secondary | ICD-10-CM | POA: Diagnosis not present

## 2018-04-13 DIAGNOSIS — D6959 Other secondary thrombocytopenia: Secondary | ICD-10-CM | POA: Diagnosis not present

## 2018-04-13 DIAGNOSIS — C679 Malignant neoplasm of bladder, unspecified: Secondary | ICD-10-CM | POA: Diagnosis not present

## 2018-04-13 DIAGNOSIS — F0391 Unspecified dementia with behavioral disturbance: Secondary | ICD-10-CM

## 2018-04-13 DIAGNOSIS — R05 Cough: Secondary | ICD-10-CM | POA: Diagnosis not present

## 2018-04-13 LAB — CMP (CANCER CENTER ONLY)
ALT: 11 U/L (ref 0–55)
AST: 15 U/L (ref 5–34)
Albumin: 3.2 g/dL — ABNORMAL LOW (ref 3.5–5.0)
Alkaline Phosphatase: 66 U/L (ref 40–150)
Anion gap: 7 (ref 3–11)
BUN: 22 mg/dL (ref 7–26)
CALCIUM: 9.8 mg/dL (ref 8.4–10.4)
CHLORIDE: 103 mmol/L (ref 98–109)
CO2: 23 mmol/L (ref 22–29)
CREATININE: 1.47 mg/dL — AB (ref 0.70–1.30)
GFR, EST AFRICAN AMERICAN: 50 mL/min — AB (ref >60)
GFR, Estimated: 43 mL/min — ABNORMAL LOW (ref >60)
Glucose, Bld: 135 mg/dL (ref 70–140)
Potassium: 4.1 mmol/L (ref 3.5–5.1)
SODIUM: 133 mmol/L — AB (ref 136–145)
Total Bilirubin: 0.4 mg/dL (ref 0.2–1.2)
Total Protein: 7.2 g/dL (ref 6.4–8.3)

## 2018-04-13 LAB — CBC WITH DIFFERENTIAL (CANCER CENTER ONLY)
BASOS PCT: 1 %
Basophils Absolute: 0 10*3/uL (ref 0.0–0.1)
EOS ABS: 1.3 10*3/uL — AB (ref 0.0–0.5)
Eosinophils Relative: 27 %
HEMATOCRIT: 26.1 % — AB (ref 38.4–49.9)
HEMOGLOBIN: 8.7 g/dL — AB (ref 13.0–17.1)
LYMPHS ABS: 1.1 10*3/uL (ref 0.9–3.3)
Lymphocytes Relative: 22 %
MCH: 32.1 pg (ref 27.2–33.4)
MCHC: 33.4 g/dL (ref 32.0–36.0)
MCV: 96.1 fL (ref 79.3–98.0)
MONOS PCT: 9 %
Monocytes Absolute: 0.4 10*3/uL (ref 0.1–0.9)
NEUTROS ABS: 2.1 10*3/uL (ref 1.5–6.5)
NEUTROS PCT: 41 %
Platelet Count: 74 10*3/uL — ABNORMAL LOW (ref 140–400)
RBC: 2.72 MIL/uL — AB (ref 4.20–5.82)
RDW: 15.4 % — ABNORMAL HIGH (ref 11.0–14.6)
WBC Count: 4.9 10*3/uL (ref 4.0–10.3)

## 2018-04-13 MED ORDER — SODIUM CHLORIDE 0.9% FLUSH
10.0000 mL | Freq: Once | INTRAVENOUS | Status: AC
Start: 2018-04-13 — End: 2018-04-13
  Administered 2018-04-13: 10 mL
  Filled 2018-04-13: qty 10

## 2018-04-13 MED ORDER — HEPARIN SOD (PORK) LOCK FLUSH 100 UNIT/ML IV SOLN
500.0000 [IU] | Freq: Once | INTRAVENOUS | Status: AC
  Administered 2018-04-13: 500 [IU]
  Filled 2018-04-13: qty 5

## 2018-04-13 MED ORDER — OXYCODONE HCL ER 10 MG PO T12A: 10.0000 mg | EXTENDED_RELEASE_TABLET | Freq: Two times a day (BID) | ORAL | 0 refills | Status: DC

## 2018-04-13 MED ORDER — MEGESTROL ACETATE 400 MG/10ML PO SUSP: 400.0000 mg | Freq: Two times a day (BID) | ORAL | 0 refills | Status: DC

## 2018-04-13 MED ORDER — OXYCODONE-ACETAMINOPHEN 10-325 MG PO TABS: 1.0000 | ORAL_TABLET | ORAL | 0 refills | Status: DC | PRN

## 2018-04-13 MED ORDER — SODIUM CHLORIDE 0.9% FLUSH
10.0000 mL | Freq: Once | INTRAVENOUS | Status: AC
  Administered 2018-04-13: 10 mL
  Filled 2018-04-13: qty 10

## 2018-04-13 NOTE — Progress Notes (Signed)
Hematology and Oncology Follow Up Visit  Charles Coffey [id number] 1935-05-15 83 y.o. 04/13/2018 9:44 AM Charles Coffey, MDFry, Charles Holter, MD   Principle Diagnosis: 83 year old man with stage IV urothelial carcinoma diagnosed in November 2018.  His tumor involves the urinary bladder as well as the genitourinary tract including retroperitoneal adenopathy. His tumor is PDL 1 testing is positive with 25% CPS     Prior Therapy:  He is status post cystoscopy and transurethral resection of a bladder tumor and on 11/25/2017.  He is status post bilateral nephrostomy tube placement in November 2018.  He is status post diverting colostomy done on 12/01/2017 performed by Dr. Barry Dienes due to colonic obstruction.  Chemotherapy utilizing gemcitabine and carboplatin.  Day 1 of cycle 1 was given on December 30, 2017.  He completed 2 cycles of therapy.  Therapy discontinued based on his wishes to poor tolerance.  Current therapy: Pembrolizumab 200 mg every 3 weeks started on March 23, 2018.  Interim History: Charles Coffey is here for a follow-up.  Since her last visit, he received the first cycle of Pembrolizumab with few complications in the last 3 weeks.  He has a reported issues with recurrent fevers related to change in his nephrostomy tube as well as periodic mental status changes.  He has reported episodic confusion and word finding difficulties.  He was started on amoxicillin which he has been taking which have controlled his fevers reasonably well.  His nephrostomy tubes have been changed periodically.  In the last 2 weeks he started noticing complaints of poor p.o. intake and have lost more weight.  His appetite all of a sudden has been poor and does not crave certain foods.  He is able to swallow without difficulties however.  He remains ambulatory with reasonable performance status.  His pain is manageable with the current pain regimen.  He does not report any exacerbations of his pain.  He does not  report any headaches, blurry vision, syncope or seizures.  He does not report any fevers, chills, sweats. He does not report any palpitation, orthopnea. He does not report any cough, wheezing or hemoptysis. He does not report any nausea, vomiting or abdominal pain. He does not report any frequency urgency or hesitancy. He does not report any skeletal complaints.  He denies any anxiety.  He does not report any skin rashes or lesions.  He denied any petechiae. He does not report any lymphadenopathy. Remaining review of systems is negative.  Medications: I have reviewed the patient's medication and discussed with the patient today.  Unchanged today. Current Outpatient Medications  Medication Sig Dispense Refill  . acetaminophen (TYLENOL) 500 MG tablet Take 1,000 mg by mouth every 8 (eight) hours as needed for mild pain or moderate pain.    Marland Kitchen amoxicillin (AMOXIL) 500 MG capsule Take 1 capsule (500 mg total) by mouth 3 (three) times daily. 63 capsule 0  . carvedilol (COREG) 6.25 MG tablet Take 1 tablet (6.25 mg total) 2 (two) times daily with a meal by mouth. 180 tablet 3  . dicyclomine (BENTYL) 10 MG capsule TAKE 1-2 BY MOUTH EVERY 6 HOURS AS NEEDED FOR RECTAL SPASMS. 90 capsule 2  . diphenhydramine-acetaminophen (TYLENOL PM EXTRA STRENGTH) 25-500 MG TABS tablet Take 1 tablet by mouth at bedtime as needed (pain,sleep).     . feeding supplement, ENSURE ENLIVE, (ENSURE ENLIVE) LIQD Take 237 mLs by mouth 2 (two) times daily between meals. 60 Bottle 0  . furosemide (LASIX) 40 MG tablet Take 40  mg by mouth daily as needed for edema or shortness of breath.  0  . lidocaine-prilocaine (EMLA) cream Apply 1 application topically as needed. 30 g 0  . linaclotide (LINZESS) 290 MCG CAPS capsule Take 1 capsule by mouth once daily 30 minutes prior to a meal. MUST HAVE OFFICE VISIT FOR FURTHER REFILLS 30 capsule 0  . megestrol (MEGACE) 400 MG/10ML suspension Take 10 mLs (400 mg total) by mouth 2 (two) times daily. 240 mL  0  . mirabegron ER (MYRBETRIQ) 25 MG TB24 tablet Take 1 tablet (25 mg total) by mouth daily. (Patient not taking: Reported on 03/31/2018) 30 tablet 0  . nitroGLYCERIN (NITROSTAT) 0.4 MG SL tablet Place 1 tablet (0.4 mg total) under the tongue every 5 (five) minutes as needed. 25 tablet 3  . oxyCODONE (OXYCONTIN) 10 mg 12 hr tablet Take 1 tablet (10 mg total) by mouth 2 (two) times daily. 60 tablet 0  . oxyCODONE-acetaminophen (PERCOCET) 10-325 MG tablet Take 1 tablet by mouth every 4 (four) hours as needed for pain. 60 tablet 0  . pramipexole (MIRAPEX) 1 MG tablet Take 1 mg by mouth daily.    . prochlorperazine (COMPAZINE) 10 MG tablet Take 1 tablet (10 mg total) by mouth every 6 (six) hours as needed for nausea or vomiting. 30 tablet 0  . ranitidine (ZANTAC) 300 MG tablet TAKE 1 TABLET BY MOUTH TWICE A DAY 60 tablet 3  . Tetrahydroz-Glyc-Hyprom-PEG (VISINE MAXIMUM REDNESS RELIEF OP) Place 2 drops into both eyes 2 (two) times daily as needed (dry eyes).     . traZODone (DESYREL) 50 MG tablet Take 1 tablet (50 mg total) by mouth at bedtime as needed for sleep. 30 tablet 2   No current facility-administered medications for this visit.      Allergies:  Allergies  Allergen Reactions  . Antihistamines, Loratadine-Type Other (See Comments)    Unable to urinate  . Statins Other (See Comments)    liver effects    Past Medical History, Surgical history, Social history, and Family History reviewed today and remain unchanged.   Physical Exam: Blood pressure (!) 116/55, pulse 75, temperature 97.7 F (36.5 C), temperature source Oral, resp. rate 18, height 5\' 8"  (1.727 m), weight 172 lb 1.6 oz (78.1 kg), SpO2 99 %.   ECOG: 1 General appearance: Well-appearing gentleman appeared comfortable. Head: Atraumatic without abnormalities. Oral mucosa: Mucous membranes are moist and pink. Eyes: Sclera anicteric. Lymph nodes: No lymphadenopathy noted in the cervical, supraclavicular, and axillary nodes   Heart: Regular rate without murmurs rubs or gallops. Lung: Clear in all lung fields without any wheezes or dullness to percussion. Abdomin: Soft, nontender, nondistended with good bowel sounds. Musculoskeletal: No clubbing or cyanosis. Skin: No petechiae or rash. Psychiatric: His mood and affect appeared normal and appropriate. Neurological: No focal deficits noted motor, sensory or deep tendon reflexes.  Cranial nerves are intact.  Lab Results: Lab Results  Component Value Date   WBC 4.9 04/13/2018   HGB 8.3 (L) 02/16/2018   HCT 26.1 (L) 04/13/2018   MCV 96.1 04/13/2018   PLT 74 (L) 04/13/2018     Chemistry      Component Value Date/Time   NA 134 (L) 04/08/2018 1033   NA 138 02/24/2018 1646   NA 133 (L) 12/29/2017 1014   K 4.3 04/08/2018 1033   K 4.4 12/29/2017 1014   CL 104 04/08/2018 1033   CO2 22 04/08/2018 1033   CO2 20 (L) 12/29/2017 1014   BUN 17 04/08/2018 1033  BUN 25 02/24/2018 1646   BUN 31.5 (H) 12/29/2017 1014   CREATININE 1.39 (H) 04/08/2018 1033   CREATININE 1.6 (H) 12/29/2017 1014      Component Value Date/Time   CALCIUM 9.5 04/08/2018 1033   CALCIUM 9.6 12/29/2017 1014   ALKPHOS 65 04/08/2018 1033   ALKPHOS 61 12/29/2017 1014   AST 11 04/08/2018 1033   AST 15 12/29/2017 1014   ALT 10 04/08/2018 1033   ALT 13 12/29/2017 1014   BILITOT 0.4 04/08/2018 1033   BILITOT 0.43 12/29/2017 1014     EXAM: MRI HEAD WITHOUT CONTRAST  TECHNIQUE: Multiplanar, multiecho pulse sequences of the brain and surrounding structures were obtained without intravenous contrast.  COMPARISON:  MRI head 03/03/2016  FINDINGS: Brain: Moderate atrophy has progressed in the interval. Negative for hydrocephalus or fluid collection. Mild chronic white matter changes with mild progression. Brainstem and cerebellum intact. Negative for hemorrhage mass or edema.  Vascular: Normal arterial flow void  Skull and upper cervical spine: Negative  Sinuses/Orbits: Mild  mucosal edema paranasal sinuses.  Normal orbit  Other: None  IMPRESSION: Progressive atrophy and mild chronic microvascular ischemic change since 2017.  No acute abnormality.  Impression and Plan:   83 year old gentleman with the following  1.  Advanced high-grade urothelial carcinoma with involvement of the retroperitoneal lymph nodes arising from the upper left genitourinary tract/ His tumor is PDL 1 positive with 25% CPS.  He is currently receiving Pembrolizumab and had received the first cycle of therapy in March 2019.  He does report a few complaints since her last treatment which is unclear is related to this drug.  He is reporting symptoms of anorexia and worsening mental status changes.  Risks and benefits of continuing this treatments was debated today and after discussion, I have elected to withhold treatment until we have a better understanding of the nature of his symptoms and I favor putting treatments on hold until he is considerably better.  We will defer treatment at this time until further workup and evaluation is needed.   2.  Bilateral hydronephrosis: Nephrostomy tube has been replaced and without any recent complications.  3.  Thrombocytopenia: Related to chemotherapy and continues to improve off treatment.  4.  IV access: Port-A-Cath remain in place without any recent complications.  5.  Pain: His pain is manageable and related to malignancy.  He continues to take OxyContin and Percocet that was refilled today.  .  6.  Altered mental status: MRI of the brain obtained on 04/11/2018 and showed no CNS metastatic disease.  However there is microvascular changes and atrophy noted.  These changes could be related to dementia that is exacerbated by his cancer diagnosis and treatment.  We will refer him to neurology for further clarification.  7.  Deep vein thrombosis: He is off anticoagulation because of risk of bleeding.  8.  Anemia: Hemoglobin remains stable  without any changes.  No transfusion will be needed.  9.  Anorexia: Prescription for Megace with instructions how to use it was given to the patient today.  10.  Prognosis and goals of care: Treatment is palliative and any future attempts at treatment will be pending in the context of achieving better quality of life.  11.  Follow-up: In 3 weeks to follow his progress.  25 minutes was spent today with the patient face-to-face.  More than 50% was spent today on education, counseling and coordination of his multifaceted care as well as answering questions regarding prognosis among other issues.  Zola Button, MD 4/17/20199:44 AM

## 2018-04-13 NOTE — Telephone Encounter (Signed)
Last OV 02/14/2018   Last refilled 01/14/2018 disp 30 with 2 refills   Sent to PCP for approval

## 2018-04-13 NOTE — Telephone Encounter (Signed)
Scheduled appt per 4/17 los- Gave patient avs and calender per los.

## 2018-04-14 ENCOUNTER — Telehealth: Payer: Self-pay | Admitting: Medical Oncology

## 2018-04-14 DIAGNOSIS — I5023 Acute on chronic systolic (congestive) heart failure: Secondary | ICD-10-CM | POA: Diagnosis not present

## 2018-04-14 DIAGNOSIS — Z436 Encounter for attention to other artificial openings of urinary tract: Secondary | ICD-10-CM | POA: Diagnosis not present

## 2018-04-14 DIAGNOSIS — N390 Urinary tract infection, site not specified: Secondary | ICD-10-CM | POA: Diagnosis not present

## 2018-04-14 DIAGNOSIS — I130 Hypertensive heart and chronic kidney disease with heart failure and stage 1 through stage 4 chronic kidney disease, or unspecified chronic kidney disease: Secondary | ICD-10-CM | POA: Diagnosis not present

## 2018-04-14 DIAGNOSIS — I2510 Atherosclerotic heart disease of native coronary artery without angina pectoris: Secondary | ICD-10-CM | POA: Diagnosis not present

## 2018-04-14 DIAGNOSIS — Z433 Encounter for attention to colostomy: Secondary | ICD-10-CM | POA: Diagnosis not present

## 2018-04-14 DIAGNOSIS — C679 Malignant neoplasm of bladder, unspecified: Secondary | ICD-10-CM | POA: Diagnosis not present

## 2018-04-14 DIAGNOSIS — Z466 Encounter for fitting and adjustment of urinary device: Secondary | ICD-10-CM | POA: Diagnosis not present

## 2018-04-14 DIAGNOSIS — D631 Anemia in chronic kidney disease: Secondary | ICD-10-CM | POA: Diagnosis not present

## 2018-04-14 DIAGNOSIS — B9562 Methicillin resistant Staphylococcus aureus infection as the cause of diseases classified elsewhere: Secondary | ICD-10-CM | POA: Diagnosis not present

## 2018-04-14 NOTE — Telephone Encounter (Signed)
PA for megaceplaced in managed care box ,

## 2018-04-14 NOTE — Progress Notes (Signed)
Prior auth for Megastrol Acetate 40/mg/mL has been submitted. Status is pending.

## 2018-04-14 NOTE — Telephone Encounter (Signed)
Pt not sleeping asking for advice. I told nurse PCP just prescribed desyrel today and to f./u with PCP.

## 2018-04-15 ENCOUNTER — Encounter: Payer: Self-pay | Admitting: General Practice

## 2018-04-15 NOTE — Progress Notes (Signed)
[facility] Spiritual Care Note  Phoned for f/u support. Charles Cupp was sleeping after another rough night. Per wife Charles Coffey, evenings and mornings are most stressful for pt and caregiver due to anxiety, insomnia, and confusion/lack of mental acuity. Charles Coffey plans to continue to ask providers about med interactions vs aging vs ischemic changes noted in last appt with Dr [name] vs dementia with sundowning--with the goal of increasing mental acuity and reducing distress for pt and family. This goal, of course, is in service to the larger goal of promoting QOL wherever possible.  Provided ca 60 min of empathic listening, emotional support, normalization of feelings particularly related to caregiver stress, and theological reflection in the context of [place]. We plan for me to f/u with Charles Coffey by phone next week and to visit with Charles Coffey 1:1 in next infusion if he desires and then with Charles Coffey 1:1 while pt in tx so that each may have space for personal conversation.   Following for support, but please also page if immediate needs arise or circumstances change. Thank you.   [place] Two, [place], [facility] Pager [phone] Voicemail [phone]

## 2018-04-18 ENCOUNTER — Encounter: Payer: Self-pay | Admitting: *Deleted

## 2018-04-18 DIAGNOSIS — K624 Stenosis of anus and rectum: Secondary | ICD-10-CM | POA: Diagnosis not present

## 2018-04-18 DIAGNOSIS — Z933 Colostomy status: Secondary | ICD-10-CM | POA: Diagnosis not present

## 2018-04-18 DIAGNOSIS — K435 Parastomal hernia without obstruction or  gangrene: Secondary | ICD-10-CM | POA: Diagnosis not present

## 2018-04-21 DIAGNOSIS — I5023 Acute on chronic systolic (congestive) heart failure: Secondary | ICD-10-CM | POA: Diagnosis not present

## 2018-04-21 DIAGNOSIS — Z433 Encounter for attention to colostomy: Secondary | ICD-10-CM | POA: Diagnosis not present

## 2018-04-21 DIAGNOSIS — Z466 Encounter for fitting and adjustment of urinary device: Secondary | ICD-10-CM | POA: Diagnosis not present

## 2018-04-21 DIAGNOSIS — N390 Urinary tract infection, site not specified: Secondary | ICD-10-CM | POA: Diagnosis not present

## 2018-04-21 DIAGNOSIS — B9562 Methicillin resistant Staphylococcus aureus infection as the cause of diseases classified elsewhere: Secondary | ICD-10-CM | POA: Diagnosis not present

## 2018-04-21 DIAGNOSIS — D631 Anemia in chronic kidney disease: Secondary | ICD-10-CM | POA: Diagnosis not present

## 2018-04-21 DIAGNOSIS — C679 Malignant neoplasm of bladder, unspecified: Secondary | ICD-10-CM | POA: Diagnosis not present

## 2018-04-21 DIAGNOSIS — I2510 Atherosclerotic heart disease of native coronary artery without angina pectoris: Secondary | ICD-10-CM | POA: Diagnosis not present

## 2018-04-21 DIAGNOSIS — Z436 Encounter for attention to other artificial openings of urinary tract: Secondary | ICD-10-CM | POA: Diagnosis not present

## 2018-04-21 DIAGNOSIS — I130 Hypertensive heart and chronic kidney disease with heart failure and stage 1 through stage 4 chronic kidney disease, or unspecified chronic kidney disease: Secondary | ICD-10-CM | POA: Diagnosis not present

## 2018-04-25 ENCOUNTER — Other Ambulatory Visit: Payer: Self-pay | Admitting: Medical

## 2018-04-25 ENCOUNTER — Telehealth: Payer: Self-pay | Admitting: Emergency Medicine

## 2018-04-25 ENCOUNTER — Ambulatory Visit (HOSPITAL_COMMUNITY)
Admission: RE | Admit: 2018-04-25 | Discharge: 2018-04-25 | Disposition: A | Discharge status: Home / Self Care | Payer: Medicare HMO | Source: Ambulatory Visit | Attending: Medical | Admitting: Medical

## 2018-04-25 ENCOUNTER — Encounter (HOSPITAL_COMMUNITY): Payer: Self-pay | Admitting: Medical

## 2018-04-25 DIAGNOSIS — N1339 Other hydronephrosis: Secondary | ICD-10-CM

## 2018-04-25 HISTORY — PX: IR PATIENT EVAL TECH 0-60 MINS: IMG5564

## 2018-04-25 NOTE — Progress Notes (Signed)
These preliminary result these preliminary results were noted.  Awaiting final report.

## 2018-04-25 NOTE — Procedures (Signed)
Patient came in today with complaint of insertion site pain.  He stated that it feels as if his skin is tearing.  I evaluated the patient with him on his belly so I could see both nephrostomy tubes.  It was noted that the tubes were very taut down the back of his jeans and under his belt.  I explained that the tubes were being pulled due to the position and there needed to be slack in the tubing.  I redressed the sites with the leg bag tubing repositioned and taped horizontally toward his abdomen before going down his leg.  That relieved his pain.  The position allows the patient to make sure it is taped himself with slack prior to going to bed since additional pain occurs at night when he rolls.  The patient and his wife understood the directions.  They will call if they have any further problems.

## 2018-04-25 NOTE — Telephone Encounter (Signed)
Returning pt's voicemail from earlier today.  Pt's wife already spoke with PA Lucianne Lei about pt's nephrostomy issues.  Followed up letting her know that PA Lucianne Lei will be setting up a referral for IR to check out tubes.  Pt verbalized understanding about follow up and that she can call with any questions or concerns.

## 2018-04-26 DIAGNOSIS — N130 Hydronephrosis with ureteropelvic junction obstruction: Secondary | ICD-10-CM | POA: Diagnosis not present

## 2018-04-26 DIAGNOSIS — Z933 Colostomy status: Secondary | ICD-10-CM | POA: Diagnosis not present

## 2018-04-26 DIAGNOSIS — R8279 Other abnormal findings on microbiological examination of urine: Secondary | ICD-10-CM | POA: Diagnosis not present

## 2018-04-26 DIAGNOSIS — K624 Stenosis of anus and rectum: Secondary | ICD-10-CM | POA: Diagnosis not present

## 2018-04-26 NOTE — Progress Notes (Signed)
These preliminary result these preliminary results were noted.  Awaiting final report.

## 2018-04-29 DIAGNOSIS — I130 Hypertensive heart and chronic kidney disease with heart failure and stage 1 through stage 4 chronic kidney disease, or unspecified chronic kidney disease: Secondary | ICD-10-CM | POA: Diagnosis not present

## 2018-04-29 DIAGNOSIS — C679 Malignant neoplasm of bladder, unspecified: Secondary | ICD-10-CM | POA: Diagnosis not present

## 2018-04-29 DIAGNOSIS — N390 Urinary tract infection, site not specified: Secondary | ICD-10-CM | POA: Diagnosis not present

## 2018-04-29 DIAGNOSIS — B9562 Methicillin resistant Staphylococcus aureus infection as the cause of diseases classified elsewhere: Secondary | ICD-10-CM | POA: Diagnosis not present

## 2018-04-29 DIAGNOSIS — I2510 Atherosclerotic heart disease of native coronary artery without angina pectoris: Secondary | ICD-10-CM | POA: Diagnosis not present

## 2018-04-29 DIAGNOSIS — I5023 Acute on chronic systolic (congestive) heart failure: Secondary | ICD-10-CM | POA: Diagnosis not present

## 2018-04-29 DIAGNOSIS — Z436 Encounter for attention to other artificial openings of urinary tract: Secondary | ICD-10-CM | POA: Diagnosis not present

## 2018-04-29 DIAGNOSIS — Z466 Encounter for fitting and adjustment of urinary device: Secondary | ICD-10-CM | POA: Diagnosis not present

## 2018-04-29 DIAGNOSIS — D631 Anemia in chronic kidney disease: Secondary | ICD-10-CM | POA: Diagnosis not present

## 2018-04-29 DIAGNOSIS — Z433 Encounter for attention to colostomy: Secondary | ICD-10-CM | POA: Diagnosis not present

## 2018-05-03 DIAGNOSIS — B9562 Methicillin resistant Staphylococcus aureus infection as the cause of diseases classified elsewhere: Secondary | ICD-10-CM | POA: Diagnosis not present

## 2018-05-03 DIAGNOSIS — N390 Urinary tract infection, site not specified: Secondary | ICD-10-CM | POA: Diagnosis not present

## 2018-05-03 DIAGNOSIS — E785 Hyperlipidemia, unspecified: Secondary | ICD-10-CM

## 2018-05-03 DIAGNOSIS — Z436 Encounter for attention to other artificial openings of urinary tract: Secondary | ICD-10-CM | POA: Diagnosis not present

## 2018-05-03 DIAGNOSIS — Z951 Presence of aortocoronary bypass graft: Secondary | ICD-10-CM

## 2018-05-03 DIAGNOSIS — Z7982 Long term (current) use of aspirin: Secondary | ICD-10-CM

## 2018-05-03 DIAGNOSIS — D631 Anemia in chronic kidney disease: Secondary | ICD-10-CM | POA: Diagnosis not present

## 2018-05-03 DIAGNOSIS — D696 Thrombocytopenia, unspecified: Secondary | ICD-10-CM | POA: Diagnosis not present

## 2018-05-03 DIAGNOSIS — Z466 Encounter for fitting and adjustment of urinary device: Secondary | ICD-10-CM | POA: Diagnosis not present

## 2018-05-03 DIAGNOSIS — K624 Stenosis of anus and rectum: Secondary | ICD-10-CM

## 2018-05-03 DIAGNOSIS — D126 Benign neoplasm of colon, unspecified: Secondary | ICD-10-CM

## 2018-05-03 DIAGNOSIS — Z906 Acquired absence of other parts of urinary tract: Secondary | ICD-10-CM

## 2018-05-03 DIAGNOSIS — N183 Chronic kidney disease, stage 3 (moderate): Secondary | ICD-10-CM

## 2018-05-03 DIAGNOSIS — I255 Ischemic cardiomyopathy: Secondary | ICD-10-CM

## 2018-05-03 DIAGNOSIS — Z433 Encounter for attention to colostomy: Secondary | ICD-10-CM | POA: Diagnosis not present

## 2018-05-03 DIAGNOSIS — I739 Peripheral vascular disease, unspecified: Secondary | ICD-10-CM | POA: Diagnosis not present

## 2018-05-03 DIAGNOSIS — M545 Low back pain: Secondary | ICD-10-CM

## 2018-05-03 DIAGNOSIS — Z87891 Personal history of nicotine dependence: Secondary | ICD-10-CM

## 2018-05-03 DIAGNOSIS — I4510 Unspecified right bundle-branch block: Secondary | ICD-10-CM

## 2018-05-03 DIAGNOSIS — K219 Gastro-esophageal reflux disease without esophagitis: Secondary | ICD-10-CM

## 2018-05-03 DIAGNOSIS — K5904 Chronic idiopathic constipation: Secondary | ICD-10-CM

## 2018-05-03 DIAGNOSIS — C679 Malignant neoplasm of bladder, unspecified: Secondary | ICD-10-CM | POA: Diagnosis not present

## 2018-05-03 DIAGNOSIS — I130 Hypertensive heart and chronic kidney disease with heart failure and stage 1 through stage 4 chronic kidney disease, or unspecified chronic kidney disease: Secondary | ICD-10-CM | POA: Diagnosis not present

## 2018-05-03 DIAGNOSIS — I5023 Acute on chronic systolic (congestive) heart failure: Secondary | ICD-10-CM | POA: Diagnosis not present

## 2018-05-04 ENCOUNTER — Encounter: Payer: Self-pay | Admitting: Neurology

## 2018-05-04 ENCOUNTER — Ambulatory Visit: Payer: Medicare HMO | Admitting: Neurology

## 2018-05-04 ENCOUNTER — Other Ambulatory Visit: Payer: Self-pay

## 2018-05-04 VITALS — BP 118/56 | HR 62 | Ht 68.0 in | Wt 170.0 lb

## 2018-05-04 DIAGNOSIS — G3184 Mild cognitive impairment, so stated: Secondary | ICD-10-CM | POA: Diagnosis not present

## 2018-05-04 NOTE — Progress Notes (Signed)
NEUROLOGY CONSULTATION NOTE  Charles Coffey MRN: [id number] DOB: 12/14/1935  Referring provider: Dr. Zola Button Primary care provider: Dr. Alysia Penna  Reason for consult:  dementia  Dear Dr [name]:  Thank you for your kind referral of Charles Coffey for consultation of the above symptoms. Although his history is well known to you, please allow me to reiterate it for the purpose of our medical record. The patient was accompanied to the clinic by his wife who also provides collateral information. Records and images were personally reviewed where available.  HISTORY OF PRESENT ILLNESS: This is a pleasant 83 year old right-handed man with a history of hypertension, hyperlipidemia, CAD s/p MI, RLS, stage IV urothelial carcinoma diagnosed in November 2018. His tumor involves the urinary bladder and GU tract, including retroperitoneal adenopathy. He has undergone surgery and chemotherapy, and continues to deal with significant fatigue, pain, as well as episodic confusion and word-finding difficulties. He feels his memory is compromised a bit, but not bad. He noticed minor memory issues prior to his cancer diagnosis, but now forgets things more. He denies getting lost driving. His wife reports that he had always been in charge of bills, but since his recurrent hospital stays, around 6 months ago started having confusion with payments coming out of the wrong accounts. They would get notices that bills were not paid or there was no money in the account. She also took over his medications. Prior to his illness, he was very cognizant of taking his medications. His wife has to explain or repeat the question several times during the visit. She states that he has had no memory issues prior to his illness, but since then cognitive issues have been progressive. She wonders if it is medication-related and held his Oxycontin. She noticed a difference in cognition after he had been off Oxycontin for 1-2  weeks, then he had another UTI with horrible bladder pains and took one dose last night. She has noticed he has become very OCD with locking doors. She has noticed difficulties carrying out a conversation, he would intersperse irrelevant issues such as something they talked about last month. His reasoning skills are not what they used to be. He has trouble getting into the computer. She has noticed his vocal pattern/intonation has changed and gone back to when "[place]." Over the past 4-5 months, he has been taking Trazodone for sleep. His wife was also giving him either THC or CBD which seemed to help sleep as well. He takes Percocet 1-2 times a day, Dicyclomine at least 2 times a day, sometimes up to 4 a day. He had hallucinations when he had a UTI, this cleared up after a couple of days in the hospital. He is currently on antibiotic treatment again until tomorrow.   His wife is very concerned about his fatigue. He has a low grade sensation of lightheadedness that started when he got ill, worse in the mornings. He denies any headaches. He feels like "everything in the room would get a little brighter." He has had intermittent visual symptoms of seeing lights flashing, not associated with worsening confusion, and has seen an eye doctor. No diplopia, dysarthria/dysphagia. He has occasional left hand numbness and tingling and cannot hold on very long to things. He has chronic back pain. He has occasional tremors in both hands, L>R and has difficulty writing sometimes because of the jerkiness. He feels Mirapex is helping with his RLS. He has 3 brother  with dementia. No history of significant concussions. He drinks alcohol occasionally.   I personally reviewed MRI brain without contrast done 04/11/18 which did not show any acute changes. There was moderate diffuse atrophy and mild chronic microvascular disease.   Laboratory Data: Lab Results  Component Value Date   TSH 1.090 08/19/2017     Lab Results  Component Value Date   DPOEUMPN36 144 11/29/2017     PAST MEDICAL HISTORY: Past Medical History:  Diagnosis Date  . Aortic atherosclerosis ([place])   . BPH with urinary obstruction   . CAD (coronary artery disease)    a.  MI 1995, CABG x 3 2002 (patient says that he had LIMA and RIMA grafts). b. ETT-Cardiolite (10/15) with EF 48%, apical scar, no ischemia. c. Nuc 07/2017 abnormal -> cath was performed,  patent LIMA to LAD and SVG to OM 2. RIMA to RCA is atretic. The right coronary artery is occluded distally with extensive collaterals from the LAD, medical therapy.   . Cancer [facility])    bladder cancer  . Cervical spondylosis without myelopathy 2020-06-1216  . Chronic low back pain    sees Dr. Kary Kos   . GERD (gastroesophageal reflux disease)   . Hiatal hernia   . Hyperlipidemia   . Hypertension   . IBS (irritable bowel syndrome)   . Ischemic cardiomyopathy   . Myocardial infarction ([place])   . RBBB   . Restless legs syndrome   . Statin intolerance   . Syncope    a. in 2015 - no apparent cause, was taking sleep medicine at the time. Cardiac workup unremarkable.  . Tubular adenoma of colon     PAST SURGICAL HISTORY: Past Surgical History:  Procedure Laterality Date  . COLONOSCOPY  10/17/2014   per Dr. Hilarie Fredrickson, tubular adenomas, repeat in 3 yrs   . COLONOSCOPY WITH PROPOFOL N/A 12/01/2017   Procedure: COLONOSCOPY WITH PROPOFOL;  Surgeon: Milus Banister, MD;  Location: [facility];  Service: Endoscopy;  Laterality: N/A;  . CYSTOSCOPY W/ RETROGRADES Left 11/25/2017   Procedure: CYSTOSCOPY WITH RETROGRADE PYELOGRAM;  Surgeon: Irine Seal, MD;  Location: [facility];  Service: Urology;  Laterality: Left;  . HEART BYPASS    . IR CONVERT RIGHT NEPHROSTOMY TO NEPHROURETERAL CATH  02/04/2018  . IR EXT NEPHROURETERAL CATH EXCHANGE  04/11/2018  . IR FLUORO GUIDE CV LINE RIGHT  12/27/2017  . IR NEPHROSTOGRAM LEFT THRU EXISTING ACCESS  12/24/2017  . IR NEPHROSTOMY EXCHANGE LEFT   01/28/2018  . IR NEPHROSTOMY EXCHANGE LEFT  02/04/2018  . IR NEPHROSTOMY EXCHANGE LEFT  02/25/2018  . IR NEPHROSTOMY EXCHANGE RIGHT  12/24/2017  . IR NEPHROSTOMY EXCHANGE RIGHT  01/28/2018  . IR NEPHROSTOMY EXCHANGE RIGHT  04/11/2018  . IR NEPHROSTOMY PLACEMENT LEFT  11/28/2017  . IR NEPHROSTOMY PLACEMENT RIGHT  12/03/2017  . IR PATIENT EVAL TECH 0-60 MINS  03/24/2018  . IR PATIENT EVAL TECH 0-60 MINS  03/28/2018  . IR PATIENT EVAL TECH 0-60 MINS  04/25/2018  . IR US GUIDE VASC ACCESS RIGHT  12/27/2017  . LAPAROSCOPY N/A 12/01/2017   Procedure: LAPAROSCOPIC DIVERTING OSTOMY;  Surgeon: Stark Klein, MD;  Location: [facility];  Service: General;  Laterality: N/A;  . LEFT HEART CATH AND CORS/GRAFTS ANGIOGRAPHY N/A 08/25/2017   Procedure: LEFT HEART CATH AND CORS/GRAFTS ANGIOGRAPHY;  Surgeon: Wellington Hampshire, MD;  Location: [place] [facility] [facility];  Service: Cardiovascular;  Laterality: N/A;  . LUMBAR FUSION  2003   L3-L4  . PROSTATE SURGERY  06-27-12  per Dr. Roni Bread, had CTT  . TONSILLECTOMY    . TRANSURETHRAL RESECTION OF BLADDER TUMOR N/A 11/25/2017   Procedure: TRANSURETHRAL RESECTION OF BLADDER TUMOR (TURBT);  Surgeon: Irine Seal, MD;  Location: [facility];  Service: Urology;  Laterality: N/A;    MEDICATIONS: Current Outpatient Medications on File Prior to Visit  Medication Sig Dispense Refill  . acetaminophen (TYLENOL) 500 MG tablet Take 1,000 mg by mouth every 8 (eight) hours as needed for mild pain or moderate pain.    Marland Kitchen amoxicillin (AMOXIL) 500 MG capsule Take 1 capsule (500 mg total) by mouth 3 (three) times daily. 63 capsule 0  . carvedilol (COREG) 6.25 MG tablet Take 1 tablet (6.25 mg total) 2 (two) times daily with a meal by mouth. 180 tablet 3  . dicyclomine (BENTYL) 10 MG capsule TAKE 1-2 BY MOUTH EVERY 6 HOURS AS NEEDED FOR RECTAL SPASMS. 90 capsule 2  . diphenhydramine-acetaminophen (TYLENOL PM EXTRA STRENGTH) 25-500 MG TABS tablet Take 1 tablet by mouth at bedtime as needed (pain,sleep).     .  feeding supplement, ENSURE ENLIVE, (ENSURE ENLIVE) LIQD Take 237 mLs by mouth 2 (two) times daily between meals. 60 Bottle 0  . furosemide (LASIX) 40 MG tablet Take 40 mg by mouth daily as needed for edema or shortness of breath.  0  . lidocaine-prilocaine (EMLA) cream Apply 1 application topically as needed. 30 g 0  . linaclotide (LINZESS) 290 MCG CAPS capsule Take 1 capsule by mouth once daily 30 minutes prior to a meal. MUST HAVE OFFICE VISIT FOR FURTHER REFILLS 30 capsule 0  . megestrol (MEGACE) 400 MG/10ML suspension Take 10 mLs (400 mg total) by mouth 2 (two) times daily. 240 mL 0  . mirabegron ER (MYRBETRIQ) 25 MG TB24 tablet Take 1 tablet (25 mg total) by mouth daily. (Patient not taking: Reported on 03/31/2018) 30 tablet 0  . nitroGLYCERIN (NITROSTAT) 0.4 MG SL tablet Place 1 tablet (0.4 mg total) under the tongue every 5 (five) minutes as needed. 25 tablet 3  . oxyCODONE (OXYCONTIN) 10 mg 12 hr tablet Take 1 tablet (10 mg total) by mouth 2 (two) times daily. 60 tablet 0  . oxyCODONE-acetaminophen (PERCOCET) 10-325 MG tablet Take 1 tablet by mouth every 4 (four) hours as needed for pain. 60 tablet 0  . pramipexole (MIRAPEX) 1 MG tablet Take 1 mg by mouth daily.    . prochlorperazine (COMPAZINE) 10 MG tablet Take 1 tablet (10 mg total) by mouth every 6 (six) hours as needed for nausea or vomiting. 30 tablet 0  . ranitidine (ZANTAC) 300 MG tablet TAKE 1 TABLET BY MOUTH TWICE A DAY 60 tablet 3  . Tetrahydroz-Glyc-Hyprom-PEG (VISINE MAXIMUM REDNESS RELIEF OP) Place 2 drops into both eyes 2 (two) times daily as needed (dry eyes).     . traZODone (DESYREL) 50 MG tablet TAKE 1 TABLET BY MOUTH AT BEDTIME AS NEEDED FOR SLEEP. 90 tablet 3   No current facility-administered medications on file prior to visit.     ALLERGIES: Allergies  Allergen Reactions  . Antihistamines, Loratadine-Type Other (See Comments)    Unable to urinate  . Statins Other (See Comments)    liver effects    FAMILY  HISTORY: Family History  Problem Relation Age of Onset  . Heart attack Mother   . Aneurysm Father        femoral artery  . Heart disease Brother   . Heart disease Maternal Uncle        x 2  .  Colon cancer Neg Hx   . Esophageal cancer Neg Hx   . Pancreatic cancer Neg Hx   . Kidney disease Neg Hx   . Liver disease Neg Hx     SOCIAL HISTORY: Social History   Socioeconomic History  . Marital status: Married    Spouse name: Not on file  . Number of children: 5  . Years of education: Some coll  . Highest education level: Not on file  Occupational History  . Occupation: retired  Scientific laboratory technician  . Financial resource strain: Not on file  . Food insecurity:    Worry: Not on file    Inability: Not on file  . Transportation needs:    Medical: Not on file    Non-medical: Not on file  Tobacco Use  . Smoking status: Former Smoker    Types: Cigarettes    Last attempt to quit: 12/28/1978    Years since quitting: 39.3  . Smokeless tobacco: Never Used  Substance and Sexual Activity  . Alcohol use: Yes    Alcohol/week: 1.2 oz    Types: 1 Glasses of wine, 1 Cans of beer per week    Comment: occassional  . Drug use: No  . Sexual activity: Not on file  Lifestyle  . Physical activity:    Days per week: Not on file    Minutes per session: Not on file  . Stress: Not on file  Relationships  . Social connections:    Talks on phone: Not on file    Gets together: Not on file    Attends religious service: Not on file    Active member of club or organization: Not on file    Attends meetings of clubs or organizations: Not on file    Relationship status: Not on file  . Intimate partner violence:    Fear of current or ex partner: Not on file    Emotionally abused: Not on file    Physically abused: Not on file    Forced sexual activity: Not on file  Other Topics Concern  . Not on file  Social History Narrative   Lives at home w/ his wife   Right-handed   5 cups of coffee per day     REVIEW OF SYSTEMS: Constitutional: No fevers, chills, or sweats, + generalized fatigue, change in appetite Eyes: No visual changes, double vision, eye pain Ear, nose and throat: No hearing loss, ear pain, nasal congestion, sore throat Cardiovascular: No chest pain, palpitations Respiratory:  No shortness of breath at rest or with exertion, wheezes GastrointestinaI: No nausea, vomiting, diarrhea, abdominal pain, fecal incontinence Genitourinary:  No dysuria, urinary retention or frequency Musculoskeletal:  No neck pain, +back pain Integumentary: No rash, pruritus, skin lesions Neurological: as above Psychiatric: No depression, +insomnia,no anxiety Endocrine: No palpitations, +fatigue,no diaphoresis, mood swings, change in appetite, change in weight, increased thirst Hematologic/Lymphatic:  No anemia, purpura, petechiae. Allergic/Immunologic: no itchy/runny eyes, nasal congestion, recent allergic reactions, rashes  PHYSICAL EXAM: Vitals:   05/04/18 1052  BP: (!) 118/56  Pulse: 62  SpO2: 98%   General: No acute distress Head:  Normocephalic/atraumatic Eyes: Fundoscopic exam shows bilateral sharp discs, no vessel changes, exudates, or hemorrhages Neck: supple, no paraspinal tenderness, full range of motion Back: No paraspinal tenderness Heart: regular rate and rhythm Lungs: Clear to auscultation bilaterally. Vascular: No carotid bruits. Skin/Extremities: No rash, no edema Neurological Exam: Mental status: alert and oriented to person, place, and time, no dysarthria or aphasia, Fund of knowledge is  appropriate.  Recent and remote memory are intact.  Attention and concentration are normal.    Able to name objects and repeat phrases.  Montreal Cognitive Assessment  05/04/2018  Visuospatial/ Executive (0/5) 5  Naming (0/3) 3  Attention: Read list of digits (0/2) 2  Attention: Read list of letters (0/1) 1  Attention: Serial 7 subtraction starting at 100 (0/3) 3  Language: Repeat  phrase (0/2) 2  Language : Fluency (0/1) 0  Abstraction (0/2) 2  Delayed Recall (0/5) 4  Orientation (0/6) 6  Total 28   Cranial nerves: CN I: not tested CN II: pupils equal, round and reactive to light, visual fields intact, fundi unremarkable. CN III, IV, VI:  full range of motion, no nystagmus, no ptosis CN V: facial sensation intact CN VII: upper and lower face symmetric CN VIII: hearing intact to finger rub CN IX, X: gag intact, uvula midline CN XI: sternocleidomastoid and trapezius muscles intact CN XII: tongue midline Bulk & Tone: normal, no fasciculations. Motor: 5/5 throughout with no pronator drift. Sensation: intact to light touch, cold, pin on both UE, decreased cold and pin on left LE, intact vibration sense.  No extinction to double simultaneous stimulation.  Romberg test negative Deep Tendon Reflexes: +2 on both UE, +1 left patella, +2 right patella and both ankles, no ankle clonus Plantar responses: downgoing bilaterally Cerebellar: no incoordination on finger to nose, heel to shin. No dysdiadochokinesia Gait: wide-based reporting back pain, no ataxia, difficulty with tandem walk Tremor: none  IMPRESSION: This is an 83 year old right-handed man with a history of hypertension, hyperlipidemia, CAD s/p MI, RLS, stage IV urothelial carcinoma s/p surgery and chemotherapy, presenting for evaluation of possible dementia. His wife has been very concerned about cognitive changes that started after his illness. She denies any prior cognitive issues prior to November 2018. With his illness, he has had difficulties managing complex tasks, she is now managing finances and medications. His neurological exam shows decreased sensation in the left leg, no focal weakness, MOCA score normal 28/30. MRI brain unremarkable. There is no clear history of progressive cognitive decline as seen with neurodegenerative conditions such as dementia, rather it appears symptoms have started with his  illness, likely cognitive deficits due to underlying medical condition, medications. We discussed holding off on sedating medication such as Trazodone and see how he does. He is also on several pain medications but deals with significant pain otherwise, continue to try to wean if possible. We discussed poor hearing and how this can also affect information processing, recommend hearing evaluation. We will continue to monitor cognition as he continues with treatment with his other physicians, he will follow-up in 6 months and knows to call for any changes.   Thank you for allowing me to participate in the care of this patient. Please do not hesitate to call for any questions or concerns.   Ellouise Newer, M.D.  CC: Dr. [name], Dr. Sarajane Jews

## 2018-05-04 NOTE — Patient Instructions (Addendum)
1. Try holding off on Trazodone and see if this helps 2. Continue with follow-up with your other doctors 3. Recommend getting hearing checked 4. Follow-up in 6 months, call for any changes

## 2018-05-06 DIAGNOSIS — N390 Urinary tract infection, site not specified: Secondary | ICD-10-CM | POA: Diagnosis not present

## 2018-05-06 DIAGNOSIS — Z436 Encounter for attention to other artificial openings of urinary tract: Secondary | ICD-10-CM | POA: Diagnosis not present

## 2018-05-06 DIAGNOSIS — B9562 Methicillin resistant Staphylococcus aureus infection as the cause of diseases classified elsewhere: Secondary | ICD-10-CM | POA: Diagnosis not present

## 2018-05-06 DIAGNOSIS — D631 Anemia in chronic kidney disease: Secondary | ICD-10-CM | POA: Diagnosis not present

## 2018-05-06 DIAGNOSIS — Z466 Encounter for fitting and adjustment of urinary device: Secondary | ICD-10-CM | POA: Diagnosis not present

## 2018-05-06 DIAGNOSIS — I2510 Atherosclerotic heart disease of native coronary artery without angina pectoris: Secondary | ICD-10-CM | POA: Diagnosis not present

## 2018-05-06 DIAGNOSIS — Z433 Encounter for attention to colostomy: Secondary | ICD-10-CM | POA: Diagnosis not present

## 2018-05-06 DIAGNOSIS — I130 Hypertensive heart and chronic kidney disease with heart failure and stage 1 through stage 4 chronic kidney disease, or unspecified chronic kidney disease: Secondary | ICD-10-CM | POA: Diagnosis not present

## 2018-05-06 DIAGNOSIS — C679 Malignant neoplasm of bladder, unspecified: Secondary | ICD-10-CM | POA: Diagnosis not present

## 2018-05-06 DIAGNOSIS — I5023 Acute on chronic systolic (congestive) heart failure: Secondary | ICD-10-CM | POA: Diagnosis not present

## 2018-05-07 ENCOUNTER — Other Ambulatory Visit: Payer: Self-pay | Admitting: Internal Medicine

## 2018-05-09 ENCOUNTER — Telehealth: Payer: Self-pay | Admitting: Oncology

## 2018-05-09 ENCOUNTER — Inpatient Hospital Stay: Payer: Medicare HMO

## 2018-05-09 ENCOUNTER — Inpatient Hospital Stay: Payer: Medicare HMO | Attending: Oncology | Admitting: Oncology

## 2018-05-09 ENCOUNTER — Encounter: Payer: Self-pay | Admitting: General Practice

## 2018-05-09 VITALS — BP 113/66 | HR 67 | Temp 98.5°F | Resp 17 | Ht 68.0 in | Wt 173.8 lb

## 2018-05-09 DIAGNOSIS — Z5112 Encounter for antineoplastic immunotherapy: Secondary | ICD-10-CM | POA: Insufficient documentation

## 2018-05-09 DIAGNOSIS — R410 Disorientation, unspecified: Secondary | ICD-10-CM | POA: Diagnosis not present

## 2018-05-09 DIAGNOSIS — Z933 Colostomy status: Secondary | ICD-10-CM | POA: Insufficient documentation

## 2018-05-09 DIAGNOSIS — R32 Unspecified urinary incontinence: Secondary | ICD-10-CM | POA: Diagnosis not present

## 2018-05-09 DIAGNOSIS — C679 Malignant neoplasm of bladder, unspecified: Secondary | ICD-10-CM | POA: Insufficient documentation

## 2018-05-09 DIAGNOSIS — G893 Neoplasm related pain (acute) (chronic): Secondary | ICD-10-CM | POA: Insufficient documentation

## 2018-05-09 DIAGNOSIS — Z7189 Other specified counseling: Secondary | ICD-10-CM | POA: Diagnosis not present

## 2018-05-09 DIAGNOSIS — Z936 Other artificial openings of urinary tract status: Secondary | ICD-10-CM

## 2018-05-09 DIAGNOSIS — D649 Anemia, unspecified: Secondary | ICD-10-CM | POA: Insufficient documentation

## 2018-05-09 DIAGNOSIS — N1330 Unspecified hydronephrosis: Secondary | ICD-10-CM | POA: Insufficient documentation

## 2018-05-09 DIAGNOSIS — Z95828 Presence of other vascular implants and grafts: Secondary | ICD-10-CM

## 2018-05-09 LAB — CBC WITH DIFFERENTIAL (CANCER CENTER ONLY)
Basophils Absolute: 0 10*3/uL (ref 0.0–0.1)
Basophils Relative: 0 %
Eosinophils Absolute: 0.6 10*3/uL — ABNORMAL HIGH (ref 0.0–0.5)
Eosinophils Relative: 9 %
HEMATOCRIT: 28.4 % — AB (ref 38.4–49.9)
Hemoglobin: 9.2 g/dL — ABNORMAL LOW (ref 13.0–17.1)
LYMPHS ABS: 2.2 10*3/uL (ref 0.9–3.3)
LYMPHS PCT: 37 %
MCH: 31.6 pg (ref 27.2–33.4)
MCHC: 32.4 g/dL (ref 32.0–36.0)
MCV: 97.6 fL (ref 79.3–98.0)
MONOS PCT: 9 %
Monocytes Absolute: 0.5 10*3/uL (ref 0.1–0.9)
NEUTROS ABS: 2.6 10*3/uL (ref 1.5–6.5)
Neutrophils Relative %: 45 %
Platelet Count: 82 10*3/uL — ABNORMAL LOW (ref 140–400)
RBC: 2.91 MIL/uL — ABNORMAL LOW (ref 4.20–5.82)
RDW: 15 % — ABNORMAL HIGH (ref 11.0–14.6)
WBC Count: 5.9 10*3/uL (ref 4.0–10.3)

## 2018-05-09 LAB — CMP (CANCER CENTER ONLY)
ALK PHOS: 56 U/L (ref 40–150)
ALT: 19 U/L (ref 0–55)
ANION GAP: 7 (ref 3–11)
AST: 17 U/L (ref 5–34)
Albumin: 3.9 g/dL (ref 3.5–5.0)
BILIRUBIN TOTAL: 0.3 mg/dL (ref 0.2–1.2)
BUN: 30 mg/dL — ABNORMAL HIGH (ref 7–26)
CALCIUM: 9.9 mg/dL (ref 8.4–10.4)
CO2: 16 mmol/L — AB (ref 22–29)
CREATININE: 1.59 mg/dL — AB (ref 0.70–1.30)
Chloride: 113 mmol/L — ABNORMAL HIGH (ref 98–109)
GFR, EST AFRICAN AMERICAN: 45 mL/min — AB (ref >60)
GFR, EST NON AFRICAN AMERICAN: 39 mL/min — AB (ref >60)
Glucose, Bld: 87 mg/dL (ref 70–140)
Potassium: 4.3 mmol/L (ref 3.5–5.1)
SODIUM: 136 mmol/L (ref 136–145)
TOTAL PROTEIN: 7.4 g/dL (ref 6.4–8.3)

## 2018-05-09 MED ORDER — SODIUM CHLORIDE 0.9% FLUSH
10.0000 mL | Freq: Once | INTRAVENOUS | Status: AC
  Administered 2018-05-09: 10 mL
  Filled 2018-05-09: qty 10

## 2018-05-09 MED ORDER — SODIUM CHLORIDE 0.9 % IV SOLN
200.0000 mg | Freq: Once | INTRAVENOUS | Status: AC
  Administered 2018-05-09: 200 mg via INTRAVENOUS
  Filled 2018-05-09: qty 8

## 2018-05-09 MED ORDER — HEPARIN SOD (PORK) LOCK FLUSH 100 UNIT/ML IV SOLN
500.0000 [IU] | Freq: Once | INTRAVENOUS | Status: AC | PRN
  Administered 2018-05-09: 500 [IU]
  Filled 2018-05-09: qty 5

## 2018-05-09 MED ORDER — SODIUM CHLORIDE 0.9 % IV SOLN
Freq: Once | INTRAVENOUS | Status: AC
  Administered 2018-05-09: 11:00:00 via INTRAVENOUS

## 2018-05-09 MED ORDER — SODIUM CHLORIDE 0.9% FLUSH
10.0000 mL | INTRAVENOUS | Status: DC | PRN
  Administered 2018-05-09: 10 mL
  Filled 2018-05-09: qty 10

## 2018-05-09 NOTE — Patient Instructions (Signed)
[facility] Discharge Instructions for Patients Receiving Chemotherapy  Today you received the following chemotherapy agents :  Keytruda.  To help prevent nausea and vomiting after your treatment, we encourage you to take your nausea medication as prescribed.   If you develop nausea and vomiting that is not controlled by your nausea medication, call the clinic.   BELOW ARE SYMPTOMS THAT SHOULD BE REPORTED IMMEDIATELY:  *FEVER GREATER THAN 100.5 F  *CHILLS WITH OR WITHOUT FEVER  NAUSEA AND VOMITING THAT IS NOT CONTROLLED WITH YOUR NAUSEA MEDICATION  *UNUSUAL SHORTNESS OF BREATH  *UNUSUAL BRUISING OR BLEEDING  TENDERNESS IN MOUTH AND THROAT WITH OR WITHOUT PRESENCE OF ULCERS  *URINARY PROBLEMS  *BOWEL PROBLEMS  UNUSUAL RASH Items with * indicate a potential emergency and should be followed up as soon as possible.  Feel free to call the clinic should you have any questions or concerns. The clinic phone number is [phone].  Please show the CHEMO ALERT CARD at check-in to the Emergency Department and triage nurse.  

## 2018-05-09 NOTE — Progress Notes (Signed)
[facility] Spiritual Care Note  Met with Charles Coffey and his wife Charles Coffey together in infusion, which we all agreed was a constructive fit. They shared and processed myriad stressors, particularly [name]'s trouble with excruciating pain at night and consequent difficulty sleeping, and mental fuzziness/confusion. They hope that following up with a urological oncologist at [facility] will lead to insights and new strategies for managing pain, sleep, and mental acuity. They utilized session well to ask questions, receive suggestions, and share their concerns with each other. [name] and I set up a f/u phone call on 5/30, but please also page if immediate needs arise or circumstances change.   [place], [place], [facility] Pager [phone] Voicemail [phone]

## 2018-05-09 NOTE — Progress Notes (Signed)
Hematology and Oncology Follow Up Visit  Charles Coffey [id number] Feb 06, 1935 83 y.o. 05/09/2018 10:11 AM Charles Coffey, MDFry, Charles Holter, MD   Principle Diagnosis: 83 year old man with advanced urothelial carcinoma diagnosed in November 2018.  His tumor involves the bladder, genitourinary as well as retroperitoneal adenopathy. His tumor is PDL 1 testing is positive with 25% CPS     Prior Therapy:  He is status post cystoscopy and transurethral resection of a bladder tumor and on 11/25/2017.  He is status post bilateral nephrostomy tube placement in November 2018.  He is status post diverting colostomy done on 12/01/2017 performed by Dr. Barry Dienes due to colonic obstruction.  Chemotherapy utilizing gemcitabine and carboplatin.  Day 1 of cycle 1 was given on December 30, 2017.  He completed 2 cycles of therapy.  Therapy discontinued based on his wishes to poor tolerance.  Current therapy: Pembrolizumab 200 mg every 3 weeks started on March 23, 2018.  He received 1 treatment so far.  Interim History: Charles Coffey presents today for a follow-up visit.  He continues to have multitude of issues that is impacting his quality of life.  He continues to have issues related to his nephrostomy tubes although recent exchange have helped some of these complaints.  He continues to have issues with nighttime confusion, increased pain as well as urge incontinence.  It is unclear to me whether he is having incontinence or producing urine via the rectum.  He was diagnosed with a urinary tract infection that was recently treated.  He continues to have worsening pain symptoms especially at nighttime.  His pain is predominantly described as spasms both in his bladder and rectum.  He takes OxyContin and Percocet at nighttime to help with his pain and sleep.  He does report episodic confusion which could be related to his medication.  His appetite has been less of an issue and he gained more weight.  He does not report  any headaches, blurry vision, syncope or seizures.  He does not report any fevers, chills, sweats. He does not report any chest pain, palpitation, orthopnea. He does not report any cough, wheezing or hemoptysis. He does not report any nausea, vomiting or abdominal pain.  He denies any hematuria.  He does not report any skeletal complaints.  He does not report any skin rashes or lesions.  He denied any petechiae. He does not report any lymphadenopathy. Remaining review of systems is negative.  Medications: I have reviewed the patient's medication and discussed with the patient personally today. Current Outpatient Medications  Medication Sig Dispense Refill  . acetaminophen (TYLENOL) 500 MG tablet Take 1,000 mg by mouth every 8 (eight) hours as needed for mild pain or moderate pain.    Marland Kitchen amoxicillin (AMOXIL) 500 MG capsule Take 1 capsule (500 mg total) by mouth 3 (three) times daily. 63 capsule 0  . amoxicillin-clavulanate (AUGMENTIN) 875-125 MG tablet Take 1 tablet by mouth 2 (two) times daily.  0  . carvedilol (COREG) 6.25 MG tablet Take 1 tablet (6.25 mg total) 2 (two) times daily with a meal by mouth. 180 tablet 3  . dicyclomine (BENTYL) 10 MG capsule TAKE 1-2 BY MOUTH EVERY 6 HOURS AS NEEDED FOR RECTAL SPASMS. 90 capsule 2  . diphenhydramine-acetaminophen (TYLENOL PM EXTRA STRENGTH) 25-500 MG TABS tablet Take 1 tablet by mouth at bedtime as needed (pain,sleep).     . feeding supplement, ENSURE ENLIVE, (ENSURE ENLIVE) LIQD Take 237 mLs by mouth 2 (two) times daily between meals. 60 Bottle  0  . furosemide (LASIX) 40 MG tablet Take 40 mg by mouth daily as needed for edema or shortness of breath.  0  . lidocaine-prilocaine (EMLA) cream Apply 1 application topically as needed. 30 g 0  . linaclotide (LINZESS) 290 MCG CAPS capsule Take 1 capsule by mouth once daily 30 minutes prior to a meal. MUST HAVE OFFICE VISIT FOR FURTHER REFILLS 30 capsule 0  . megestrol (MEGACE) 400 MG/10ML suspension Take 10 mLs  (400 mg total) by mouth 2 (two) times daily. 240 mL 0  . mirabegron ER (MYRBETRIQ) 25 MG TB24 tablet Take 1 tablet (25 mg total) by mouth daily. 30 tablet 0  . nitroGLYCERIN (NITROSTAT) 0.4 MG SL tablet Place 1 tablet (0.4 mg total) under the tongue every 5 (five) minutes as needed. 25 tablet 3  . oxyCODONE (OXYCONTIN) 10 mg 12 hr tablet Take 1 tablet (10 mg total) by mouth 2 (two) times daily. 60 tablet 0  . oxyCODONE-acetaminophen (PERCOCET) 10-325 MG tablet Take 1 tablet by mouth every 4 (four) hours as needed for pain. 60 tablet 0  . pramipexole (MIRAPEX) 1 MG tablet Take 1 mg by mouth daily.    . prochlorperazine (COMPAZINE) 10 MG tablet Take 1 tablet (10 mg total) by mouth every 6 (six) hours as needed for nausea or vomiting. 30 tablet 0  . ranitidine (ZANTAC) 300 MG tablet TAKE 1 TABLET BY MOUTH TWICE A DAY 60 tablet 3  . Tetrahydroz-Glyc-Hyprom-PEG (VISINE MAXIMUM REDNESS RELIEF OP) Place 2 drops into both eyes 2 (two) times daily as needed (dry eyes).     . traZODone (DESYREL) 50 MG tablet TAKE 1 TABLET BY MOUTH AT BEDTIME AS NEEDED FOR SLEEP. 90 tablet 3   No current facility-administered medications for this visit.    Facility-Administered Medications Ordered in Other Visits  Medication Dose Route Frequency Provider Last Rate Last Dose  . 0.9 %  sodium chloride infusion   Intravenous Once Wyatt Portela, MD      . heparin lock flush 100 unit/mL  500 Units Intracatheter Once PRN Wyatt Portela, MD      . pembrolizumab [facility]) 200 mg in sodium chloride 0.9 % 50 mL chemo infusion  200 mg Intravenous Once Vernecia Umble N, MD      . sodium chloride flush (NS) 0.9 % injection 10 mL  10 mL Intracatheter PRN Wyatt Portela, MD         Allergies:  Allergies  Allergen Reactions  . Antihistamines, Loratadine-Type Other (See Comments)    Unable to urinate  . Statins Other (See Comments)    liver effects    Past Medical History, Surgical history, Social history, and Family History  reviewed today and remain unchanged.   Physical Exam: Blood pressure 113/66, pulse 67, temperature 98.5 F (36.9 C), temperature source Oral, resp. rate 17, height 5\' 8"  (1.727 m), weight 173 lb 12.8 oz (78.8 kg), SpO2 99 %.   ECOG: 1 General appearance: Alert, awake gentleman appeared without distress today. Head: Normocephalic without abnormalities. Oral mucosa: No thrush or ulcers. Eyes: Nipples are equal and round reactive to light. Lymph nodes: No cervical, supraclavicular, and axillary lymphadenopathy. Heart: Regular rate without any murmurs rubs or gallops. Lung: Clear that any rhonchi, wheezes or dullness to percussion. Abdomin: Soft, without any rebound or guarding. Musculoskeletal: No clubbing or cyanosis. Skin: No ecchymosis or petechiae. Psychiatric: Appropriate mood and affect. Neurological: Neurologically intact.  Lab Results: Lab Results  Component Value Date   WBC 5.9 05/09/2018  HGB 9.2 (L) 05/09/2018   HCT 28.4 (L) 05/09/2018   MCV 97.6 05/09/2018   PLT 82 (L) 05/09/2018     Chemistry      Component Value Date/Time   NA 136 05/09/2018 0823   NA 138 02/24/2018 1646   NA 133 (L) 12/29/2017 1014   K 4.3 05/09/2018 0823   K 4.4 12/29/2017 1014   CL 113 (H) 05/09/2018 0823   CO2 16 (L) 05/09/2018 0823   CO2 20 (L) 12/29/2017 1014   BUN 30 (H) 05/09/2018 0823   BUN 25 02/24/2018 1646   BUN 31.5 (H) 12/29/2017 1014   CREATININE 1.59 (H) 05/09/2018 0823   CREATININE 1.6 (H) 12/29/2017 1014      Component Value Date/Time   CALCIUM 9.9 05/09/2018 0823   CALCIUM 9.6 12/29/2017 1014   ALKPHOS 56 05/09/2018 0823   ALKPHOS 61 12/29/2017 1014   AST 17 05/09/2018 0823   AST 15 12/29/2017 1014   ALT 19 05/09/2018 0823   ALT 13 12/29/2017 1014   BILITOT 0.3 05/09/2018 0823   BILITOT 0.43 12/29/2017 10251       83 year old gentleman with the following  1.  High-grade urothelial carcinoma with anesthetic disease involving the retroperitoneal lymph nodes  arising from the upper left genitourinary tract. His tumor is PDL 1 positive with 25% CPS.  He is status post 1 cycle of Pembrolizumab without any major complications.  He did have a constellation of symptoms that are likely unrelated to this medication.  Risks and benefits of proceeding with cycle 2 was discussed today and after discussion is agreeable to proceed.  We will repeat imaging studies after cycle 3 or sooner if needed 2.   2.  Bilateral hydronephrosis: Nephrostomy tube drained in place and exchanged periodically.  3.  Thrombocytopenia: Improving off systemic chemotherapy.  No bleeding noted.  4.  IV access: Port-A-Cath will be in use without complications.  5.  Pelvic pain: Related to malignancy and currently on OxyContin and Percocet.  I recommended continuing this regimen.  6.  Altered mental status: He has been evaluated extensively with an MRI and neurology consultation.  Likely related to medication rather than an neurological issue.  I recommended stopping trazodone as a first step.  7.  Urinary incontinence and possible fistula formation: He has a follow-up with urology at [facility] for evaluation.  8.  Anemia: Hemoglobin improving and does not require any growth factor support or transfusion.  9.  Anorexia: Improved with Megace and has gained more weight since last visit.  10.  Prognosis and goals of care: Was discussed again in detail.  He understands he has an incurable malignancy with a goal is to palliate his symptoms as much as possible.  11.  Follow-up: In 4 weeks to follow his progress.  25 minutes was spent today with the patient face-to-face.  More than 50% was spent today on education, counseling and answering questions regarding diagnosis, prognosis and future plan of care.  Zola Button, MD 5/13/201910:11 AM

## 2018-05-09 NOTE — Telephone Encounter (Signed)
Gave patient avs report and appointments for June. Per 5/13 los lab/flush/fu/tx 6/10. Due to patient being out of town the week of 6/10 - per FS ok for patient next appointments to be 6/18 per patient request. Date/time for 6/18 appointments per FS.

## 2018-05-09 NOTE — Progress Notes (Signed)
Per Dr [name] OK to treat with CRT of 1.59 and PLT of 82

## 2018-05-11 DIAGNOSIS — Z933 Colostomy status: Secondary | ICD-10-CM | POA: Diagnosis not present

## 2018-05-11 DIAGNOSIS — K624 Stenosis of anus and rectum: Secondary | ICD-10-CM | POA: Diagnosis not present

## 2018-05-12 ENCOUNTER — Other Ambulatory Visit: Payer: Self-pay | Admitting: Internal Medicine

## 2018-05-12 ENCOUNTER — Encounter: Payer: Self-pay | Admitting: Medical

## 2018-05-12 DIAGNOSIS — Z466 Encounter for fitting and adjustment of urinary device: Secondary | ICD-10-CM | POA: Diagnosis not present

## 2018-05-12 DIAGNOSIS — B9562 Methicillin resistant Staphylococcus aureus infection as the cause of diseases classified elsewhere: Secondary | ICD-10-CM | POA: Diagnosis not present

## 2018-05-12 DIAGNOSIS — Z436 Encounter for attention to other artificial openings of urinary tract: Secondary | ICD-10-CM | POA: Diagnosis not present

## 2018-05-12 DIAGNOSIS — C679 Malignant neoplasm of bladder, unspecified: Secondary | ICD-10-CM | POA: Diagnosis not present

## 2018-05-12 DIAGNOSIS — N390 Urinary tract infection, site not specified: Secondary | ICD-10-CM | POA: Diagnosis not present

## 2018-05-12 DIAGNOSIS — I130 Hypertensive heart and chronic kidney disease with heart failure and stage 1 through stage 4 chronic kidney disease, or unspecified chronic kidney disease: Secondary | ICD-10-CM | POA: Diagnosis not present

## 2018-05-12 DIAGNOSIS — I2510 Atherosclerotic heart disease of native coronary artery without angina pectoris: Secondary | ICD-10-CM | POA: Diagnosis not present

## 2018-05-12 DIAGNOSIS — Z433 Encounter for attention to colostomy: Secondary | ICD-10-CM | POA: Diagnosis not present

## 2018-05-12 DIAGNOSIS — I5023 Acute on chronic systolic (congestive) heart failure: Secondary | ICD-10-CM | POA: Diagnosis not present

## 2018-05-12 DIAGNOSIS — D631 Anemia in chronic kidney disease: Secondary | ICD-10-CM | POA: Diagnosis not present

## 2018-05-13 ENCOUNTER — Other Ambulatory Visit: Payer: Self-pay | Admitting: Oncology

## 2018-05-13 ENCOUNTER — Telehealth: Payer: Self-pay | Admitting: Internal Medicine

## 2018-05-13 ENCOUNTER — Other Ambulatory Visit: Payer: Self-pay | Admitting: *Deleted

## 2018-05-13 DIAGNOSIS — N135 Crossing vessel and stricture of ureter without hydronephrosis: Secondary | ICD-10-CM | POA: Diagnosis not present

## 2018-05-13 DIAGNOSIS — C689 Malignant neoplasm of urinary organ, unspecified: Secondary | ICD-10-CM | POA: Diagnosis not present

## 2018-05-13 DIAGNOSIS — Z79899 Other long term (current) drug therapy: Secondary | ICD-10-CM | POA: Diagnosis not present

## 2018-05-13 DIAGNOSIS — N3289 Other specified disorders of bladder: Secondary | ICD-10-CM | POA: Diagnosis not present

## 2018-05-13 DIAGNOSIS — R4182 Altered mental status, unspecified: Secondary | ICD-10-CM | POA: Diagnosis not present

## 2018-05-13 DIAGNOSIS — G893 Neoplasm related pain (acute) (chronic): Secondary | ICD-10-CM | POA: Diagnosis not present

## 2018-05-13 DIAGNOSIS — Z298 Encounter for other specified prophylactic measures: Secondary | ICD-10-CM | POA: Diagnosis not present

## 2018-05-13 MED ORDER — FUROSEMIDE 40 MG PO TABS: 40.0000 mg | ORAL_TABLET | Freq: Two times a day (BID) | ORAL | 1 refills | Status: DC

## 2018-05-13 NOTE — Telephone Encounter (Signed)
Wife called back stating that pharmacy found script for [place]. She was able to pick up the prescription already.

## 2018-05-13 NOTE — Telephone Encounter (Signed)
Left voicemail to call back. Rx sent yesterday with 3 additional refills.

## 2018-05-13 NOTE — Telephone Encounter (Signed)
Pt's wife calling requesting [facility] sent to [facility]. She stated that she was just there and they told her that they have not received anything. Pt is out of med.

## 2018-05-15 ENCOUNTER — Encounter: Payer: Self-pay | Admitting: Oncology

## 2018-05-16 ENCOUNTER — Ambulatory Visit (HOSPITAL_COMMUNITY): Payer: Medicare HMO

## 2018-05-16 ENCOUNTER — Other Ambulatory Visit: Payer: Self-pay | Admitting: *Deleted

## 2018-05-16 ENCOUNTER — Other Ambulatory Visit: Payer: Self-pay | Admitting: Internal Medicine

## 2018-05-16 ENCOUNTER — Other Ambulatory Visit (HOSPITAL_COMMUNITY): Payer: Self-pay | Admitting: Interventional Radiology

## 2018-05-16 DIAGNOSIS — C689 Malignant neoplasm of urinary organ, unspecified: Secondary | ICD-10-CM

## 2018-05-16 DIAGNOSIS — C679 Malignant neoplasm of bladder, unspecified: Secondary | ICD-10-CM

## 2018-05-16 MED ORDER — OXYCODONE-ACETAMINOPHEN 10-325 MG PO TABS: 1.0000 | ORAL_TABLET | ORAL | 0 refills | Status: DC | PRN

## 2018-05-17 NOTE — Telephone Encounter (Signed)
Refill Request.  

## 2018-05-24 ENCOUNTER — Ambulatory Visit (HOSPITAL_COMMUNITY): Payer: Medicare HMO

## 2018-05-24 DIAGNOSIS — Z801 Family history of malignant neoplasm of trachea, bronchus and lung: Secondary | ICD-10-CM | POA: Diagnosis not present

## 2018-05-24 DIAGNOSIS — R102 Pelvic and perineal pain: Secondary | ICD-10-CM | POA: Diagnosis not present

## 2018-05-24 DIAGNOSIS — Z4682 Encounter for fitting and adjustment of non-vascular catheter: Secondary | ICD-10-CM | POA: Diagnosis not present

## 2018-05-24 DIAGNOSIS — Z933 Colostomy status: Secondary | ICD-10-CM | POA: Diagnosis not present

## 2018-05-24 DIAGNOSIS — Z79891 Long term (current) use of opiate analgesic: Secondary | ICD-10-CM | POA: Diagnosis not present

## 2018-05-24 DIAGNOSIS — G893 Neoplasm related pain (acute) (chronic): Secondary | ICD-10-CM | POA: Diagnosis not present

## 2018-05-24 DIAGNOSIS — Z515 Encounter for palliative care: Secondary | ICD-10-CM | POA: Diagnosis not present

## 2018-05-24 DIAGNOSIS — C679 Malignant neoplasm of bladder, unspecified: Secondary | ICD-10-CM | POA: Diagnosis not present

## 2018-05-24 DIAGNOSIS — G629 Polyneuropathy, unspecified: Secondary | ICD-10-CM | POA: Diagnosis not present

## 2018-05-24 DIAGNOSIS — N135 Crossing vessel and stricture of ureter without hydronephrosis: Secondary | ICD-10-CM | POA: Diagnosis not present

## 2018-05-24 DIAGNOSIS — M792 Neuralgia and neuritis, unspecified: Secondary | ICD-10-CM | POA: Diagnosis not present

## 2018-05-26 ENCOUNTER — Encounter: Payer: Self-pay | Admitting: Medical

## 2018-05-26 ENCOUNTER — Encounter: Payer: Self-pay | Admitting: General Practice

## 2018-05-26 ENCOUNTER — Other Ambulatory Visit: Payer: Self-pay | Admitting: Medical

## 2018-05-26 NOTE — Progress Notes (Signed)
[place] Spiritual Care Note  LVM for wife [name] as planned for caregiver support. Encouraged f/u whenever convenient.   [place], [place], [facility] Pager [phone] Voicemail [phone]

## 2018-05-26 NOTE — Progress Notes (Signed)
[place] Spiritual Care Note  Received return call from wife [name]. Served as empathic witness to their recent struggles with a difficult nephrostomy-tube change at [facility] and ongoing challenges at night. Per [name], they are concerned that their upcoming trip to visit their children in [place] may be too difficult for Charles Coffey.  Following for support. We plan for me to meet with couple in infusion on 6/18 because our last meeting was so helpful for both of them.   [place], [place], [facility] Pager [phone] Voicemail [phone]

## 2018-05-30 ENCOUNTER — Inpatient Hospital Stay: Payer: Medicare HMO | Attending: Oncology

## 2018-05-30 ENCOUNTER — Inpatient Hospital Stay (HOSPITAL_BASED_OUTPATIENT_CLINIC_OR_DEPARTMENT_OTHER): Payer: Medicare HMO | Admitting: Medical

## 2018-05-30 ENCOUNTER — Encounter: Payer: Self-pay | Admitting: Medical

## 2018-05-30 ENCOUNTER — Other Ambulatory Visit: Payer: Self-pay | Admitting: Medical

## 2018-05-30 VITALS — BP 121/60 | HR 78 | Temp 98.4°F | Resp 18 | Ht 68.0 in | Wt 172.0 lb

## 2018-05-30 DIAGNOSIS — C679 Malignant neoplasm of bladder, unspecified: Secondary | ICD-10-CM

## 2018-05-30 DIAGNOSIS — R5383 Other fatigue: Secondary | ICD-10-CM | POA: Insufficient documentation

## 2018-05-30 DIAGNOSIS — Z5112 Encounter for antineoplastic immunotherapy: Secondary | ICD-10-CM | POA: Diagnosis not present

## 2018-05-30 DIAGNOSIS — G4701 Insomnia due to medical condition: Secondary | ICD-10-CM | POA: Insufficient documentation

## 2018-05-30 DIAGNOSIS — Z936 Other artificial openings of urinary tract status: Secondary | ICD-10-CM | POA: Diagnosis not present

## 2018-05-30 DIAGNOSIS — Z87891 Personal history of nicotine dependence: Secondary | ICD-10-CM | POA: Diagnosis not present

## 2018-05-30 DIAGNOSIS — D72829 Elevated white blood cell count, unspecified: Secondary | ICD-10-CM | POA: Diagnosis not present

## 2018-05-30 DIAGNOSIS — J181 Lobar pneumonia, unspecified organism: Secondary | ICD-10-CM | POA: Diagnosis not present

## 2018-05-30 DIAGNOSIS — R530 Neoplastic (malignant) related fatigue: Secondary | ICD-10-CM

## 2018-05-30 DIAGNOSIS — D72823 Leukemoid reaction: Secondary | ICD-10-CM

## 2018-05-30 LAB — CBC WITH DIFFERENTIAL (CANCER CENTER ONLY)
BASOS PCT: 1 %
Basophils Absolute: 0.1 10*3/uL (ref 0.0–0.1)
Eosinophils Absolute: 0.9 10*3/uL — ABNORMAL HIGH (ref 0.0–0.5)
Eosinophils Relative: 8 %
HEMATOCRIT: 27.3 % — AB (ref 38.4–49.9)
HEMOGLOBIN: 9.1 g/dL — AB (ref 13.0–17.1)
LYMPHS ABS: 1.4 10*3/uL (ref 0.9–3.3)
LYMPHS PCT: 13 %
MCH: 31.6 pg (ref 27.2–33.4)
MCHC: 33.3 g/dL (ref 32.0–36.0)
MCV: 95.1 fL (ref 79.3–98.0)
MONO ABS: 0.4 10*3/uL (ref 0.1–0.9)
MONOS PCT: 4 %
NEUTROS ABS: 8.3 10*3/uL — AB (ref 1.5–6.5)
NEUTROS PCT: 74 %
Platelet Count: 87 10*3/uL — ABNORMAL LOW (ref 140–400)
RBC: 2.88 MIL/uL — ABNORMAL LOW (ref 4.20–5.82)
RDW: 15.3 % — AB (ref 11.0–14.6)
WBC Count: 11.2 10*3/uL — ABNORMAL HIGH (ref 4.0–10.3)

## 2018-05-30 LAB — TSH: TSH: 0.835 u[IU]/mL (ref 0.320–4.118)

## 2018-05-30 LAB — CMP (CANCER CENTER ONLY)
ALBUMIN: 3.7 g/dL (ref 3.5–5.0)
ALK PHOS: 66 U/L (ref 40–150)
ALT: 15 U/L (ref 0–55)
AST: 17 U/L (ref 5–34)
Anion gap: 9 (ref 3–11)
BILIRUBIN TOTAL: 0.2 mg/dL (ref 0.2–1.2)
BUN: 32 mg/dL — ABNORMAL HIGH (ref 7–26)
CALCIUM: 10.1 mg/dL (ref 8.4–10.4)
CO2: 13 mmol/L — ABNORMAL LOW (ref 22–29)
Chloride: 112 mmol/L — ABNORMAL HIGH (ref 98–109)
Creatinine: 1.51 mg/dL — ABNORMAL HIGH (ref 0.70–1.30)
GFR, EST NON AFRICAN AMERICAN: 42 mL/min — AB (ref >60)
GFR, Est AFR Am: 48 mL/min — ABNORMAL LOW (ref >60)
GLUCOSE: 147 mg/dL — AB (ref 70–140)
POTASSIUM: 4.6 mmol/L (ref 3.5–5.1)
Sodium: 134 mmol/L — ABNORMAL LOW (ref 136–145)
TOTAL PROTEIN: 7.4 g/dL (ref 6.4–8.3)

## 2018-05-30 MED ORDER — MIRTAZAPINE 15 MG PO TABS: 15.0000 mg | ORAL_TABLET | Freq: Every day | ORAL | 2 refills | Status: DC

## 2018-05-30 NOTE — Patient Instructions (Signed)
Percutaneous Nephrostomy Home Guide °Percutaneous nephrostomy is a procedure to insert a flexible tube into your kidney so that urine can leave your body. This procedure may be done if a medical condition prevents urine from leaving your kidney in the usual way. During the procedure, the nephrostomy tube is inserted in the right or left side of your lower back and is connected to an external drainage bag. °After you have a nephrostomy tube placed, urine will collect in the drainage bag outside of your body. You will need to empty and change the drainage bag as needed. You will also need to take steps to care for the area where the nephrostomy tube was inserted (tube insertion site). °How do I care for my nephrostomy tube? °· Always keep your tubing, the leg bag, or the bedside drainage bag below the level of your kidney so that your urine drains freely. °· Avoid activities that would cause bending or pulling of your tubing. Ask your health care provider what activities are safe for you. °· When connecting your nephrostomy tube to a drainage bag, make sure that there are no kinks in the tubing and that your urine is draining freely. You may want to gently wrap an elastic bandage over the tubing. This will help keep the tubing in place and prevent it from kinking. Make sure there is no tension on the tubing so it does not become dislodged. °· At night, you may want to connect your nephrostomy tube or the leg bag to a larger bedside drainage bag. °How do I empty the drainage bag? °Empty the leg bag or bedside drainage bag whenever it becomes ? full. Also empty it before you go to sleep. Most drainage bags have a drain at the bottom that allows urine to be emptied. Follow these basic steps: °1. Hold the drainage bag over a toilet or collection container. Use a measuring container if your health care provider told you to measure your urine. °2. Open the drain of the bag and allow the urine to drain out. °3. After all the  urine has drained from the drainage bag, close the drain fully. °4. Flush the urine down the toilet. If a collection container was used, rinse the container. ° °How do I change the dressing around the nephrostomy tube? °Change your dressing and clean your tube exit site as told by your health care provider. You may need to change the dressing every day for the first 2 weeks after having a nephrostomy tube inserted. After the first 2 weeks, you may be told to change the dressing two times a week. °Supplies needed: °· Mild soap and water. °· Split gauze pads, 4 × 4 inches (10 x 10 cm). °· Gauze pads, 4 × 4 inches (10 x 10 cm). °· Paper tape. °How to change the dressing: °Because of the location of your nephrostomy tube, you may need help from another person to complete dressing changes. Follow these basic steps: °1. Wash hands with soap and water. °2. Gently remove the tape and dressing from around the nephrostomy tube. Be careful not to pull on the tube while removing the dressing. Avoid using scissors because they may damage the tube. °3. Wash the skin around the tube with mild soap and water, rinse well, and pat the skin dry with a clean cloth. °4. Check the skin around the drain for redness, swelling, pus, warmth, or a bad smell. °5. If the drain was sutured to the skin, check the suture to   verify that it is still anchored in the skin. 6. Place two split gauze pads in and around the tube exit site. Do not apply ointments or alcohol to the site. 7. Place a gauze pad on top of the split gauze pad. 8. Coil the tube on top of the gauze. The tubing should rest on the gauze, not on the skin. 9. Place tape around each edge of the gauze pad. 10. Secure the nephrostomy tubing. Make sure that the tube does not kink or become pinched. The tubing should rest on the gauze pad, not on the skin. 11. Dispose of used supplies properly.  How do I flush my nephrostomy tube? Use a saline syringe to rinse out (flush) your  nephrostomy tube as told by your health care provider. Flushing is easier if a three-way stopcock is placed between the tube and the drainage bag. One connection of the stopcock connects to your tube, the second connects to the drainage bag, and the third is usually covered with a cap. The lever on the stopcock points to the direction on the stopcock that is closed to flow. Normally, the lever points in the direction of the cap to allow urine to drain from the tube to the drainage bag. Supplies needed:  Rubbing alcohol wipe.  10 mL 0.9% saline syringe. How to flush the tube: 1. Move the lever of the three-way stopcock so it points toward the drainage bag. 2. Clean the cap with a rubbing alcohol wipe. 3. Screw the tip of a 10 mL 0.9% saline syringe onto the cap. 4. Using the syringe plunger, slowly push the 10 mL 0.9% saline in the syringe over 5-10 seconds. If resistance is met or pain occurs while pushing, stop pushing the saline. 5. Remove the syringe from the cap. 6. Return the stopcock lever to the usual position, pointing in the direction of the cap. 7. Dispose of used supplies properly. How do I replace the drainage bag? Replace the drainage bag, three-way stopcock, and any extension tubing as told by your health care provider. Make sure you always have an extra drainage bag and connecting tubing available. 1. Empty urine from your drainage bag. 2. Gather a new drainage bag, three-way stopcock, and any extension tubing. 3. Remove the drainage bag, three-way stopcock, and any extension tubing from the nephrostomy tube. 4. Attach the new leg bag or bedside drainage bag, three-way stopcock, and any extension tubing to the nephrostomy tube. 5. Dispose of the used drainage bag, three-way stopcock, and any extension.  Contact a health care provider if:  You have problems with any of the valves or tubing.  You have persistent pain or soreness in your back.  You have more redness, swelling,  or pain around your tube insertion site.  You have more fluid or blood coming from your tube insertion site.  Your tube insertion site feels warm to the touch.  You have pus or a bad smell coming from your tube insertion site.  You have increased urine output or you feel burning when urinating. Get help right away if:  You have pain in your abdomen during the first week.  You have chest pain or have trouble breathing.  You have a new appearance of blood in your urine.  You have a fever or chills.  You have back pain that is not relieved by your medicine.  You have decreased urine output.  Your nephrostomy tube comes out. This information is not intended to replace advice given to  you by your health care provider. Make sure you discuss any questions you have with your health care provider. Document Released: 10/04/2013 Document Revised: 09/25/2016 Document Reviewed: 09/25/2016 Elsevier Interactive Patient Education  [employer].

## 2018-05-30 NOTE — Progress Notes (Signed)
Pt reports feeling extremely cold at times without fever and extreme fatigue, both starting after his nephrostomy tubes were changed last week.  A&Ox4.  Denies pain.  Denies N/V/D.

## 2018-05-31 NOTE — Progress Notes (Signed)
[facility] Progress Note   [name] [id number] March 28, 1935 83 y.o.  Charles Coffey is managed by Dr. [name]  Actively treated with chemotherapy/immunotherapy: yes  Current Therapy: Keytruda  Last Treated: 05/09/2018 (cycle 2, day 1)  Assessment: Plan:    Insomnia due to medical condition - Plan: mirtazapine (REMERON) 15 MG tablet  Leukemoid reaction  Insomnia: The patient had been previously given a prescription for Ativan by palliative care.  This caused him to be active during the night without his knowledge.  His wife reports that items were found in various places around the house with multiple lights on.  He is agreeable to begin a trial of Remeron 15 mg p.o. nightly for insomnia and anxiety.   Leukocytosis: A CBC was completed today which showed a WBC of 11.2 with an ANC of 8.3.  [name] is currently on Macrobid 100 mg once daily as a prophylaxis.  He has been instructed to increase this to twice daily dosing for the next 7 days.   Fatigue: A CBC, chemistry panel, and TSH were ordered today.  Please see After Visit Summary for patient specific instructions.  Future Appointments  Date Time Provider [place]  06/14/2018 12:45 PM [facility] ONLY [facility] None  06/14/2018  1:00 PM [facility] FLUSH NURSE 2 [facility] None  06/14/2018  1:30 PM Wyatt Portela, MD [facility] None  06/14/2018  2:30 PM [facility] D11 [facility] None  11/29/2018  3:00 PM Cameron Sprang, MD [facility] None    No orders of the defined types were placed in this encounter.      Subjective:   Patient ID:  Charles Coffey is a 84 y.o. (DOB 1935-11-25) male.  Chief Complaint:  Chief Complaint  Patient presents with  . Fatigue    HPI Charles Coffey is an 83 year old male with a history of a stage IV urothelial carcinoma.  He is status post cycle 2, day 1 on 05/09/2018. He is status post change out of his bilateral nephrostomy tube at [facility] on  05/13/2018.  He reports spiking a low-grade temperature that evening on his return home.  He presents to the clinic today with his wife with a report of increasing fatigue, cold sensitivity, itching around the insertion site of his bilateral nephrostomy tubes, and occasional bladder spasms.  He denies nausea, vomiting, diarrhea.  He and his wife plan to travel to [place] within the next 2 weeks.   Medications: I have reviewed the patient's current medications.  Allergies:  Allergies  Allergen Reactions  . Antihistamines, Loratadine-Type Other (See Comments)    Unable to urinate  . Statins Other (See Comments)    liver effects    Past Medical History:  Diagnosis Date  . Aortic atherosclerosis ([place])   . BPH with urinary obstruction   . CAD (coronary artery disease)    a.  MI 1995, CABG x 3 2002 (patient says that he had LIMA and RIMA grafts). b. ETT-Cardiolite (10/15) with EF 48%, apical scar, no ischemia. c. Nuc 07/2017 abnormal -> cath was performed,  patent LIMA to LAD and SVG to OM 2. RIMA to RCA is atretic. The right coronary artery is occluded distally with extensive collaterals from the LAD, medical therapy.   . Cancer [facility])    bladder cancer  . Cervical spondylosis without myelopathy 02-May-202017  . Chronic low back pain    sees Dr. Kary Kos   . GERD (gastroesophageal reflux disease)   . Hiatal hernia   .  Hyperlipidemia   . Hypertension   . IBS (irritable bowel syndrome)   . Ischemic cardiomyopathy   . Myocardial infarction ([place])   . RBBB   . Restless legs syndrome   . Statin intolerance   . Syncope    a. in 2015 - no apparent cause, was taking sleep medicine at the time. Cardiac workup unremarkable.  . Tubular adenoma of colon     Past Surgical History:  Procedure Laterality Date  . COLONOSCOPY  10/17/2014   per Dr. Hilarie Fredrickson, tubular adenomas, repeat in 3 yrs   . COLONOSCOPY WITH PROPOFOL N/A 12/01/2017   Procedure: COLONOSCOPY WITH PROPOFOL;  Surgeon: Milus Banister, MD;  [facility];  Service: Endoscopy;  Laterality: N/A;  . CYSTOSCOPY W/ RETROGRADES Left 11/25/2017   Procedure: CYSTOSCOPY WITH RETROGRADE PYELOGRAM;  Surgeon: Irine Seal, MD;  [facility];  Service: Urology;  Laterality: Left;  . HEART BYPASS    . IR CONVERT RIGHT NEPHROSTOMY TO NEPHROURETERAL CATH  02/04/2018  . IR EXT NEPHROURETERAL CATH EXCHANGE  04/11/2018  . IR FLUORO GUIDE CV LINE RIGHT  12/27/2017  . IR NEPHROSTOGRAM LEFT THRU EXISTING ACCESS  12/24/2017  . IR NEPHROSTOMY EXCHANGE LEFT  01/28/2018  . IR NEPHROSTOMY EXCHANGE LEFT  02/04/2018  . IR NEPHROSTOMY EXCHANGE LEFT  02/25/2018  . IR NEPHROSTOMY EXCHANGE RIGHT  12/24/2017  . IR NEPHROSTOMY EXCHANGE RIGHT  01/28/2018  . IR NEPHROSTOMY EXCHANGE RIGHT  04/11/2018  . IR NEPHROSTOMY PLACEMENT LEFT  11/28/2017  . IR NEPHROSTOMY PLACEMENT RIGHT  12/03/2017  . IR PATIENT EVAL TECH 0-60 MINS  03/24/2018  . IR PATIENT EVAL TECH 0-60 MINS  03/28/2018  . IR PATIENT EVAL TECH 0-60 MINS  04/25/2018  . IR US GUIDE VASC ACCESS RIGHT  12/27/2017  . LAPAROSCOPY N/A 12/01/2017   Procedure: LAPAROSCOPIC DIVERTING OSTOMY;  Surgeon: Stark Klein, MD;  [facility];  Service: General;  Laterality: N/A;  . LEFT HEART CATH AND CORS/GRAFTS ANGIOGRAPHY N/A 08/25/2017   Procedure: LEFT HEART CATH AND CORS/GRAFTS ANGIOGRAPHY;  Surgeon: Wellington Hampshire, MD;  Location: [place] CV LAB;  Service: Cardiovascular;  Laterality: N/A;  . LUMBAR FUSION  2003   L3-L4  . PROSTATE SURGERY  06-27-12   per Dr. Roni Bread, had CTT  . TONSILLECTOMY    . TRANSURETHRAL RESECTION OF BLADDER TUMOR N/A 11/25/2017   Procedure: TRANSURETHRAL RESECTION OF BLADDER TUMOR (TURBT);  Surgeon: Irine Seal, MD;  [facility];  Service: Urology;  Laterality: N/A;    Family History  Problem Relation Age of Onset  . Heart attack Mother   . Aneurysm Father        femoral artery  . Heart disease Brother   . Heart disease Maternal Uncle        x 2  . Colon cancer Neg  Hx   . Esophageal cancer Neg Hx   . Pancreatic cancer Neg Hx   . Kidney disease Neg Hx   . Liver disease Neg Hx     Social History   Socioeconomic History  . Marital status: Married    Spouse name: Not on file  . Number of children: 5  . Years of education: Some coll  . Highest education level: Not on file  Occupational History  . Occupation: retired  Scientific laboratory technician  . Financial resource strain: Not on file  . Food insecurity:    Worry: Not on file    Inability: Not on file  . Transportation needs:  Medical: Not on file    Non-medical: Not on file  Tobacco Use  . Smoking status: Former Smoker    Types: Cigarettes    Last attempt to quit: 12/28/1978    Years since quitting: 39.4  . Smokeless tobacco: Never Used  Substance and Sexual Activity  . Alcohol use: Yes    Alcohol/week: 1.2 oz    Types: 1 Glasses of wine, 1 Cans of beer per week    Comment: occassional  . Drug use: No  . Sexual activity: Not on file  Lifestyle  . Physical activity:    Days per week: Not on file    Minutes per session: Not on file  . Stress: Not on file  Relationships  . Social connections:    Talks on phone: Not on file    Gets together: Not on file    Attends religious service: Not on file    Active member of club or organization: Not on file    Attends meetings of clubs or organizations: Not on file    Relationship status: Not on file  . Intimate partner violence:    Fear of current or ex partner: Not on file    Emotionally abused: Not on file    Physically abused: Not on file    Forced sexual activity: Not on file  Other Topics Concern  . Not on file  Social History Narrative   Lives at home w/ his wife   Right-handed   5 cups of coffee per day    Past Medical History, Surgical history, Social history, and Family history were reviewed and updated as appropriate.   Please see review of systems for further details on the patient's review from today.   Review of Systems:    Review of Systems  Constitutional: Positive for fatigue. Negative for chills and diaphoresis.  HENT: Negative for congestion, facial swelling and trouble swallowing.   Respiratory: Negative for cough, choking, chest tightness, shortness of breath and wheezing.   Cardiovascular: Negative for chest pain and palpitations.  Gastrointestinal: Negative for nausea and vomiting.  Musculoskeletal: Negative for back pain.  Skin: Negative for rash.  Neurological: Negative for dizziness, speech difficulty and headaches.  Psychiatric/Behavioral: The patient is not nervous/anxious.     Objective:   Physical Exam:  BP 121/60 (BP Location: Left Arm, Patient Position: Sitting)   Pulse 78   Temp 98.4 F (36.9 C) (Oral)   Resp 18   Ht 5\' 8"  (1.727 m)   Wt 172 lb (78 kg)   SpO2 100%   BMI 26.15 kg/m  ECOG: 0  Physical Exam  Constitutional: No distress.  HENT:  Head: Normocephalic and atraumatic.  Cardiovascular: Normal rate, regular rhythm and normal heart sounds. Exam reveals no gallop and no friction rub.  No murmur heard. Pulmonary/Chest: Effort normal and breath sounds normal. No respiratory distress. He has no wheezes. He has no rales.  Musculoskeletal:       Arms: Neurological: He is alert.  Skin: Skin is warm and dry. No rash noted. He is not diaphoretic. No erythema.    Lab Review:     Component Value Date/Time   NA 134 (L) 05/30/2018 1417   NA 138 02/24/2018 1646   NA 133 (L) 12/29/2017 1014   K 4.6 05/30/2018 1417   K 4.4 12/29/2017 1014   CL 112 (H) 05/30/2018 1417   CO2 13 (L) 05/30/2018 1417   CO2 20 (L) 12/29/2017 1014   GLUCOSE 147 (H) 05/30/2018 1417  GLUCOSE 102 12/29/2017 1014   BUN 32 (H) 05/30/2018 1417   BUN 25 02/24/2018 1646   BUN 31.5 (H) 12/29/2017 1014   CREATININE 1.51 (H) 05/30/2018 1417   CREATININE 1.6 (H) 12/29/2017 1014   CALCIUM 10.1 05/30/2018 1417   CALCIUM 9.6 12/29/2017 1014   PROT 7.4 05/30/2018 1417   PROT 6.8 12/29/2017 1014    ALBUMIN 3.7 05/30/2018 1417   ALBUMIN 3.8 12/29/2017 1014   AST 17 05/30/2018 1417   AST 15 12/29/2017 1014   ALT 15 05/30/2018 1417   ALT 13 12/29/2017 1014   ALKPHOS 66 05/30/2018 1417   ALKPHOS 61 12/29/2017 1014   BILITOT 0.2 05/30/2018 1417   BILITOT 0.43 12/29/2017 1014   GFRNONAA 42 (L) 05/30/2018 1417   GFRAA 48 (L) 05/30/2018 1417       Component Value Date/Time   WBC 11.2 (H) 05/30/2018 1417   WBC 7.4 02/16/2018 2336   RBC 2.88 (L) 05/30/2018 1417   HGB 9.1 (L) 05/30/2018 1417   HGB 8.8 (L) 12/29/2017 1014   HCT 27.3 (L) 05/30/2018 1417   HCT 26.0 (L) 12/29/2017 1014   PLT 87 (L) 05/30/2018 1417   PLT 70 (L) 12/29/2017 1014   PLT 115 (L) 08/31/2017 1135   MCV 95.1 05/30/2018 1417   MCV 89.3 12/29/2017 1014   MCH 31.6 05/30/2018 1417   MCHC 33.3 05/30/2018 1417   RDW 15.3 (H) 05/30/2018 1417   RDW 15.5 (H) 12/29/2017 1014   LYMPHSABS 1.4 05/30/2018 1417   LYMPHSABS 1.4 12/29/2017 1014   MONOABS 0.4 05/30/2018 1417   MONOABS 0.6 12/29/2017 1014   EOSABS 0.9 (H) 05/30/2018 1417   EOSABS 0.2 12/29/2017 1014   BASOSABS 0.1 05/30/2018 1417   BASOSABS 0.0 12/29/2017 1014   -------------------------------  Imaging from last 24 hours (if applicable):  Radiology interpretation: No results found.

## 2018-06-01 ENCOUNTER — Encounter: Payer: Self-pay | Admitting: Oncology

## 2018-06-02 ENCOUNTER — Encounter: Payer: Self-pay | Admitting: Medical

## 2018-06-02 ENCOUNTER — Other Ambulatory Visit: Payer: Self-pay | Admitting: *Deleted

## 2018-06-02 DIAGNOSIS — C679 Malignant neoplasm of bladder, unspecified: Secondary | ICD-10-CM

## 2018-06-02 MED ORDER — OXYCODONE HCL ER 10 MG PO T12A: 10.0000 mg | EXTENDED_RELEASE_TABLET | Freq: Two times a day (BID) | ORAL | 0 refills | Status: DC

## 2018-06-03 ENCOUNTER — Encounter: Payer: Self-pay | Admitting: Medical

## 2018-06-06 ENCOUNTER — Encounter

## 2018-06-06 ENCOUNTER — Ambulatory Visit: Payer: Medicare HMO | Admitting: Neurology

## 2018-06-13 ENCOUNTER — Telehealth: Payer: Self-pay

## 2018-06-13 ENCOUNTER — Inpatient Hospital Stay: Payer: Medicare HMO | Admitting: Medical

## 2018-06-13 ENCOUNTER — Ambulatory Visit (HOSPITAL_COMMUNITY)
Admission: RE | Admit: 2018-06-13 | Discharge: 2018-06-13 | Disposition: A | Discharge status: Home / Self Care | Payer: Medicare HMO | Source: Ambulatory Visit | Attending: Medical | Admitting: Medical

## 2018-06-13 ENCOUNTER — Inpatient Hospital Stay (HOSPITAL_BASED_OUTPATIENT_CLINIC_OR_DEPARTMENT_OTHER): Payer: Medicare HMO | Admitting: Medical

## 2018-06-13 ENCOUNTER — Other Ambulatory Visit: Payer: Self-pay | Admitting: *Deleted

## 2018-06-13 ENCOUNTER — Telehealth: Payer: Self-pay | Admitting: *Deleted

## 2018-06-13 VITALS — BP 115/63 | HR 72 | Temp 98.5°F | Resp 18 | Ht 68.0 in | Wt 175.5 lb

## 2018-06-13 DIAGNOSIS — Z5112 Encounter for antineoplastic immunotherapy: Secondary | ICD-10-CM | POA: Diagnosis not present

## 2018-06-13 DIAGNOSIS — I5021 Acute systolic (congestive) heart failure: Secondary | ICD-10-CM

## 2018-06-13 DIAGNOSIS — R062 Wheezing: Secondary | ICD-10-CM | POA: Insufficient documentation

## 2018-06-13 DIAGNOSIS — C679 Malignant neoplasm of bladder, unspecified: Secondary | ICD-10-CM

## 2018-06-13 DIAGNOSIS — R0602 Shortness of breath: Secondary | ICD-10-CM | POA: Diagnosis not present

## 2018-06-13 DIAGNOSIS — R918 Other nonspecific abnormal finding of lung field: Secondary | ICD-10-CM | POA: Diagnosis not present

## 2018-06-13 DIAGNOSIS — R05 Cough: Secondary | ICD-10-CM | POA: Diagnosis not present

## 2018-06-13 DIAGNOSIS — J189 Pneumonia, unspecified organism: Secondary | ICD-10-CM

## 2018-06-13 DIAGNOSIS — I493 Ventricular premature depolarization: Secondary | ICD-10-CM

## 2018-06-13 DIAGNOSIS — J181 Lobar pneumonia, unspecified organism: Secondary | ICD-10-CM

## 2018-06-13 LAB — CBC WITH DIFFERENTIAL (CANCER CENTER ONLY)
BASOS ABS: 0 10*3/uL (ref 0.0–0.1)
BASOS PCT: 0 %
Eosinophils Absolute: 1.2 10*3/uL — ABNORMAL HIGH (ref 0.0–0.5)
Eosinophils Relative: 21 %
HEMATOCRIT: 25.6 % — AB (ref 38.4–49.9)
HEMOGLOBIN: 8.5 g/dL — AB (ref 13.0–17.1)
Lymphocytes Relative: 24 %
Lymphs Abs: 1.3 10*3/uL (ref 0.9–3.3)
MCH: 31.8 pg (ref 27.2–33.4)
MCHC: 33.2 g/dL (ref 32.0–36.0)
MCV: 95.9 fL (ref 79.3–98.0)
Monocytes Absolute: 0.5 10*3/uL (ref 0.1–0.9)
Monocytes Relative: 10 %
NEUTROS ABS: 2.6 10*3/uL (ref 1.5–6.5)
NEUTROS PCT: 45 %
Platelet Count: 72 10*3/uL — ABNORMAL LOW (ref 140–400)
RBC: 2.67 MIL/uL — AB (ref 4.20–5.82)
RDW: 15.2 % — ABNORMAL HIGH (ref 11.0–14.6)
WBC: 5.7 10*3/uL (ref 4.0–10.3)

## 2018-06-13 LAB — CMP (CANCER CENTER ONLY)
ALBUMIN: 3.5 g/dL (ref 3.5–5.0)
ALK PHOS: 63 U/L (ref 40–150)
ALT: 9 U/L (ref 0–55)
AST: 13 U/L (ref 5–34)
Anion gap: 7 (ref 3–11)
BILIRUBIN TOTAL: 0.3 mg/dL (ref 0.2–1.2)
BUN: 25 mg/dL (ref 7–26)
CO2: 20 mmol/L — ABNORMAL LOW (ref 22–29)
Calcium: 9.7 mg/dL (ref 8.4–10.4)
Chloride: 108 mmol/L (ref 98–109)
Creatinine: 1.46 mg/dL — ABNORMAL HIGH (ref 0.70–1.30)
GFR, Est AFR Am: 50 mL/min — ABNORMAL LOW (ref >60)
GFR, Estimated: 43 mL/min — ABNORMAL LOW (ref >60)
GLUCOSE: 95 mg/dL (ref 70–140)
Potassium: 4.3 mmol/L (ref 3.5–5.1)
Sodium: 135 mmol/L — ABNORMAL LOW (ref 136–145)
Total Protein: 7.3 g/dL (ref 6.4–8.3)

## 2018-06-13 MED ORDER — LEVOFLOXACIN 500 MG PO TABS: 500.0000 mg | ORAL_TABLET | Freq: Every day | ORAL | 0 refills | Status: DC

## 2018-06-13 MED ORDER — GUAIFENESIN-CODEINE 100-10 MG/5ML PO SOLN: 5.0000 mL | Freq: Four times a day (QID) | ORAL | 0 refills | Status: DC | PRN

## 2018-06-13 NOTE — Progress Notes (Signed)
These preliminary result these preliminary results were noted.  Awaiting final report.

## 2018-06-13 NOTE — Telephone Encounter (Signed)
Wife called and is concerned about his coughing, sore throat and wheezing. He has no fever. Instructed her to come to [facility] today for labs and see Sandi Mealy, PA. She verbalized understanding.

## 2018-06-13 NOTE — Telephone Encounter (Signed)
Printed avs and calender of upcoming appointments per 6/17 los of V.Tanner

## 2018-06-14 ENCOUNTER — Inpatient Hospital Stay: Payer: Medicare HMO | Admitting: Oncology

## 2018-06-14 ENCOUNTER — Inpatient Hospital Stay: Payer: Medicare HMO

## 2018-06-14 DIAGNOSIS — K624 Stenosis of anus and rectum: Secondary | ICD-10-CM | POA: Diagnosis not present

## 2018-06-14 DIAGNOSIS — Z933 Colostomy status: Secondary | ICD-10-CM | POA: Diagnosis not present

## 2018-06-14 NOTE — Progress Notes (Signed)
[facility] Progress Note   Charles Coffey [id number] Jun 26, 1935 83 y.o.  Charles Coffey is managed by Dr. [name]  Actively treated with chemotherapy/immunotherapy: yes  Current Therapy: Keytruda  Last Treated: 05/09/2018 (cycle 2, day 1)  Assessment: Plan:    Pneumonia of right middle lobe due to infectious organism ([place]) - Plan: levofloxacin (LEVAQUIN) 500 MG tablet, guaiFENesin-codeine 100-10 MG/5ML syrup  Urothelial carcinoma of bladder ([place])   Pneumonia of the right middle lobe: [name] was referred for a chest x-ray today which returned showing: Subsegmental atelectasis or early pneumonia in the right middle lobe anteriorly and laterally.  A CBC returned showing a WBC of 5.7, ANC of 2.8, hemoglobin at 8.5, hematocrit at 25.6, and platelet count of 72.  The patient's chemistry panel was stable with no acute changes.  He was given a prescription for Levaquin 500 mg p.o. daily x7 days and guaifenesin with codeine cough syrup.  Metastatic urothelial carcinoma of the bladder: The patient is status post cycle 2, day 1 of Keytruda dosed on 05/09/2018.  He is scheduled for cycle 3 tomorrow.  Based on his diagnosis of pneumonia he will be deferred by 1 week.  He will return for follow-up on 06/21/2018.  Please see After Visit Summary for patient specific instructions.  Future Appointments  Date Time Provider [place]  06/21/2018 11:30 AM [facility] LAB 3 [facility] None  06/21/2018 12:00 PM Wyatt Portela, MD [facility] None  06/21/2018 12:45 PM [facility] INFUSION [facility] None  11/29/2018  3:00 PM Cameron Sprang, MD [facility] None    No orders of the defined types were placed in this encounter.      Subjective:   Patient ID:  Charles Coffey is a 83 y.o. (DOB Dec 28, 1935) male.  Chief Complaint:  Chief Complaint  Patient presents with  . Pruritis  . Dehydration  . Cough  . Sore Throat  . Altered Mental Status    HPI Charles Coffey is an 83 year old male with a history of a stage IV urothelial carcinoma who is managed by Dr. [name].  The patient is status post cycle 2, day 1 of Keytruda which was last dosed on 05/09/2018.  He was scheduled to have his next treatment on 06/14/2018.  He presents to the office today having recently returned from a trip to [place] for his grandson's birthday.  He states that he was doing well on the initial part of his journey but halfway through developed a productive cough, increased shortness of breath, sore throat, and chills.  [name] states that the patient turned on the heat at their home last night and set the temperature at 90 degrees.  Medications: I have reviewed the patient's current medications.  Allergies:  Allergies  Allergen Reactions  . Antihistamines, Loratadine-Type Other (See Comments)    Unable to urinate  . Statins Other (See Comments)    liver effects    Past Medical History:  Diagnosis Date  . Aortic atherosclerosis ([place])   . BPH with urinary obstruction   . CAD (coronary artery disease)    a.  MI 1995, CABG x 3 2002 (patient says that he had LIMA and RIMA grafts). b. ETT-Cardiolite (10/15) with EF 48%, apical scar, no ischemia. c. Nuc 07/2017 abnormal -> cath was performed,  patent LIMA to LAD and SVG to OM 2. RIMA to RCA is atretic. The right coronary artery is occluded distally with extensive collaterals from the LAD, medical therapy.   Marland Kitchen  Cancer [facility])    bladder cancer  . Cervical spondylosis without myelopathy 11/30/2016  . Chronic low back pain    sees Dr. Kary Kos   . GERD (gastroesophageal reflux disease)   . Hiatal hernia   . Hyperlipidemia   . Hypertension   . IBS (irritable bowel syndrome)   . Ischemic cardiomyopathy   . Myocardial infarction ([place])   . RBBB   . Restless legs syndrome   . Statin intolerance   . Syncope    a. in 2015 - no apparent cause, was taking sleep medicine at the time. Cardiac workup unremarkable.  .  Tubular adenoma of colon     Past Surgical History:  Procedure Laterality Date  . COLONOSCOPY  10/17/2014   per Dr. Hilarie Fredrickson, tubular adenomas, repeat in 3 yrs   . COLONOSCOPY WITH PROPOFOL N/A 12/01/2017   Procedure: COLONOSCOPY WITH PROPOFOL;  Surgeon: Milus Banister, MD;  [facility];  Service: Endoscopy;  Laterality: N/A;  . CYSTOSCOPY W/ RETROGRADES Left 11/25/2017   Procedure: CYSTOSCOPY WITH RETROGRADE PYELOGRAM;  Surgeon: Irine Seal, MD;  [facility];  Service: Urology;  Laterality: Left;  . HEART BYPASS    . IR CONVERT RIGHT NEPHROSTOMY TO NEPHROURETERAL CATH  02/04/2018  . IR EXT NEPHROURETERAL CATH EXCHANGE  04/11/2018  . IR FLUORO GUIDE CV LINE RIGHT  12/27/2017  . IR NEPHROSTOGRAM LEFT THRU EXISTING ACCESS  12/24/2017  . IR NEPHROSTOMY EXCHANGE LEFT  01/28/2018  . IR NEPHROSTOMY EXCHANGE LEFT  02/04/2018  . IR NEPHROSTOMY EXCHANGE LEFT  02/25/2018  . IR NEPHROSTOMY EXCHANGE RIGHT  12/24/2017  . IR NEPHROSTOMY EXCHANGE RIGHT  01/28/2018  . IR NEPHROSTOMY EXCHANGE RIGHT  04/11/2018  . IR NEPHROSTOMY PLACEMENT LEFT  11/28/2017  . IR NEPHROSTOMY PLACEMENT RIGHT  12/03/2017  . IR PATIENT EVAL TECH 0-60 MINS  03/24/2018  . IR PATIENT EVAL TECH 0-60 MINS  03/28/2018  . IR PATIENT EVAL TECH 0-60 MINS  04/25/2018  . IR US GUIDE VASC ACCESS RIGHT  12/27/2017  . LAPAROSCOPY N/A 12/01/2017   Procedure: LAPAROSCOPIC DIVERTING OSTOMY;  Surgeon: Stark Klein, MD;  [facility];  Service: General;  Laterality: N/A;  . LEFT HEART CATH AND CORS/GRAFTS ANGIOGRAPHY N/A 08/25/2017   Procedure: LEFT HEART CATH AND CORS/GRAFTS ANGIOGRAPHY;  Surgeon: Wellington Hampshire, MD;  Location: [place] CV LAB;  Service: Cardiovascular;  Laterality: N/A;  . LUMBAR FUSION  2003   L3-L4  . PROSTATE SURGERY  06-27-12   per Dr. Roni Bread, had CTT  . TONSILLECTOMY    . TRANSURETHRAL RESECTION OF BLADDER TUMOR N/A 11/25/2017   Procedure: TRANSURETHRAL RESECTION OF BLADDER TUMOR (TURBT);  Surgeon: Irine Seal,  MD;  [facility];  Service: Urology;  Laterality: N/A;    Family History  Problem Relation Age of Onset  . Heart attack Mother   . Aneurysm Father        femoral artery  . Heart disease Brother   . Heart disease Maternal Uncle        x 2  . Colon cancer Neg Hx   . Esophageal cancer Neg Hx   . Pancreatic cancer Neg Hx   . Kidney disease Neg Hx   . Liver disease Neg Hx     Social History   Socioeconomic History  . Marital status: Married    Spouse name: Not on file  . Number of children: 5  . Years of education: Some coll  . Highest education level: Not on file  Occupational History  . Occupation: retired  Scientific laboratory technician  . Financial resource strain: Not on file  . Food insecurity:    Worry: Not on file    Inability: Not on file  . Transportation needs:    Medical: Not on file    Non-medical: Not on file  Tobacco Use  . Smoking status: Former Smoker    Types: Cigarettes    Last attempt to quit: 12/28/1978    Years since quitting: 39.4  . Smokeless tobacco: Never Used  Substance and Sexual Activity  . Alcohol use: Yes    Alcohol/week: 1.2 oz    Types: 1 Glasses of wine, 1 Cans of beer per week    Comment: occassional  . Drug use: No  . Sexual activity: Not on file  Lifestyle  . Physical activity:    Days per week: Not on file    Minutes per session: Not on file  . Stress: Not on file  Relationships  . Social connections:    Talks on phone: Not on file    Gets together: Not on file    Attends religious service: Not on file    Active member of club or organization: Not on file    Attends meetings of clubs or organizations: Not on file    Relationship status: Not on file  . Intimate partner violence:    Fear of current or ex partner: Not on file    Emotionally abused: Not on file    Physically abused: Not on file    Forced sexual activity: Not on file  Other Topics Concern  . Not on file  Social History Narrative   Lives at home w/ his wife    Right-handed   5 cups of coffee per day    Past Medical History, Surgical history, Social history, and Family history were reviewed and updated as appropriate.   Please see review of systems for further details on the patient's review from today.   Review of Systems:  Review of Systems  Constitutional: Positive for chills. Negative for diaphoresis and fever.  HENT: Positive for sore throat. Negative for congestion, postnasal drip, rhinorrhea, sinus pressure, sinus pain and sneezing.   Respiratory: Positive for cough and shortness of breath. Negative for choking, chest tightness and wheezing.   Cardiovascular: Negative for chest pain and palpitations.  Neurological: Negative for headaches.    Objective:   Physical Exam:  BP 115/63 (BP Location: Left Arm, Patient Position: Sitting)   Pulse 72   Temp 98.5 F (36.9 C) (Oral)   Resp 18   Ht 5\' 8"  (1.727 m)   Wt 175 lb 8 oz (79.6 kg)   SpO2 100%   BMI 26.68 kg/m  ECOG: 1  Physical Exam  Constitutional: No distress.  HENT:  Head: Normocephalic and atraumatic.  Right Ear: External ear normal.  Left Ear: External ear normal.  Mouth/Throat: Oropharynx is clear and moist. No oropharyngeal exudate.  Neck: Normal range of motion. Neck supple.  Cardiovascular: Normal rate, regular rhythm and normal heart sounds. Exam reveals no gallop and no friction rub.  No murmur heard. Pulmonary/Chest: Effort normal and breath sounds normal. No respiratory distress. He has no wheezes. He has no rales.  Abdominal:    Musculoskeletal:       Arms: Lymphadenopathy:    He has no cervical adenopathy.  Neurological: He is alert. Coordination normal.  Skin: Skin is warm and dry. No rash noted. He is not diaphoretic. No erythema.  Psychiatric:  He has a normal mood and affect. His behavior is normal. Judgment and thought content normal.    Lab Review:     Component Value Date/Time   NA 135 (L) 06/13/2018 1237   NA 138 02/24/2018 1646   NA 133  (L) 12/29/2017 1014   K 4.3 06/13/2018 1237   K 4.4 12/29/2017 1014   CL 108 06/13/2018 1237   CO2 20 (L) 06/13/2018 1237   CO2 20 (L) 12/29/2017 1014   GLUCOSE 95 06/13/2018 1237   GLUCOSE 102 12/29/2017 1014   BUN 25 06/13/2018 1237   BUN 25 02/24/2018 1646   BUN 31.5 (H) 12/29/2017 1014   CREATININE 1.46 (H) 06/13/2018 1237   CREATININE 1.6 (H) 12/29/2017 1014   CALCIUM 9.7 06/13/2018 1237   CALCIUM 9.6 12/29/2017 1014   PROT 7.3 06/13/2018 1237   PROT 6.8 12/29/2017 1014   ALBUMIN 3.5 06/13/2018 1237   ALBUMIN 3.8 12/29/2017 1014   AST 13 06/13/2018 1237   AST 15 12/29/2017 1014   ALT 9 06/13/2018 1237   ALT 13 12/29/2017 1014   ALKPHOS 63 06/13/2018 1237   ALKPHOS 61 12/29/2017 1014   BILITOT 0.3 06/13/2018 1237   BILITOT 0.43 12/29/2017 1014   GFRNONAA 43 (L) 06/13/2018 1237   GFRAA 50 (L) 06/13/2018 1237       Component Value Date/Time   WBC 5.7 06/13/2018 1237   WBC 7.4 02/16/2018 2336   RBC 2.67 (L) 06/13/2018 1237   HGB 8.5 (L) 06/13/2018 1237   HGB 8.8 (L) 12/29/2017 1014   HCT 25.6 (L) 06/13/2018 1237   HCT 26.0 (L) 12/29/2017 1014   PLT 72 (L) 06/13/2018 1237   PLT 70 (L) 12/29/2017 1014   PLT 115 (L) 08/31/2017 1135   MCV 95.9 06/13/2018 1237   MCV 89.3 12/29/2017 1014   MCH 31.8 06/13/2018 1237   MCHC 33.2 06/13/2018 1237   RDW 15.2 (H) 06/13/2018 1237   RDW 15.5 (H) 12/29/2017 1014   LYMPHSABS 1.3 06/13/2018 1237   LYMPHSABS 1.4 12/29/2017 1014   MONOABS 0.5 06/13/2018 1237   MONOABS 0.6 12/29/2017 1014   EOSABS 1.2 (H) 06/13/2018 1237   EOSABS 0.2 12/29/2017 1014   BASOSABS 0.0 06/13/2018 1237   BASOSABS 0.0 12/29/2017 1014   -------------------------------  Imaging from last 24 hours (if applicable):  Radiology interpretation: Dg Chest 2 View  Result Date: 06/13/2018 CLINICAL DATA:  One week of productive cough, congestion, weakness, and shortness of breath. History of bladder malignancy. EXAM: CHEST - 2 VIEW COMPARISON:  Chest x-ray  of April 08, 2018. FINDINGS: The lungs are adequately inflated. The lung markings are slightly more conspicuous in the right middle lobe anteriorly and laterally. The left lung is clear. The heart and pulmonary vascularity are normal. There are post CABG changes. There is calcification in the wall of the aortic arch. The porta catheter tip projects over the junction of the middle and distal thirds of the SVC. There are bilateral nephrostomy tubes in the upper abdomen. IMPRESSION: Subsegmental atelectasis or early pneumonia in the right middle lobe anteriorly and laterally. Followup PA and lateral chest X-ray is recommended in 3-4 weeks following trial of antibiotic therapy to ensure resolution and exclude underlying malignancy. Electronically Signed   By: David  Martinique M.D.   On: 06/13/2018 12:32

## 2018-06-16 ENCOUNTER — Encounter: Payer: Self-pay | Admitting: Medical

## 2018-06-17 DIAGNOSIS — L299 Pruritus, unspecified: Secondary | ICD-10-CM | POA: Diagnosis not present

## 2018-06-17 DIAGNOSIS — C679 Malignant neoplasm of bladder, unspecified: Secondary | ICD-10-CM | POA: Diagnosis not present

## 2018-06-17 DIAGNOSIS — N3289 Other specified disorders of bladder: Secondary | ICD-10-CM | POA: Diagnosis not present

## 2018-06-17 DIAGNOSIS — M792 Neuralgia and neuritis, unspecified: Secondary | ICD-10-CM | POA: Diagnosis not present

## 2018-06-17 DIAGNOSIS — Z515 Encounter for palliative care: Secondary | ICD-10-CM | POA: Diagnosis not present

## 2018-06-17 DIAGNOSIS — G893 Neoplasm related pain (acute) (chronic): Secondary | ICD-10-CM | POA: Diagnosis not present

## 2018-06-20 ENCOUNTER — Ambulatory Visit (HOSPITAL_COMMUNITY)
Admission: RE | Admit: 2018-06-20 | Discharge: 2018-06-20 | Disposition: A | Discharge status: Home / Self Care | Payer: Medicare HMO | Source: Ambulatory Visit | Attending: Radiology | Admitting: Radiology

## 2018-06-20 ENCOUNTER — Encounter (HOSPITAL_COMMUNITY): Payer: Self-pay | Admitting: Radiology

## 2018-06-20 ENCOUNTER — Other Ambulatory Visit (HOSPITAL_COMMUNITY): Payer: Self-pay | Admitting: Radiology

## 2018-06-20 DIAGNOSIS — C689 Malignant neoplasm of urinary organ, unspecified: Secondary | ICD-10-CM

## 2018-06-20 HISTORY — PX: IR PATIENT EVAL TECH 0-60 MINS: IMG5564

## 2018-06-20 NOTE — Procedures (Signed)
Patient came in today for education of how to use foley bag with pcn tubes placed at [facility] 3 weeks ago.  He also had complaints of pain and pulling at the insertion site of the left pcn tube.  I evaluated the tubes.  Neither tube had a stat-lock retention device on it to keep it from pulling.  As a result the sutures in the left side were tearing thru the skin and the site was bleeding minimally.  I removed the sutures on both sides and placed the statlock retention devices on to secure the tubes.  I once again advised the patient not to tuck the catheter directly down under the waste line of his pants.  This causes significant pull on the catheter.  I dressed the sites slightly lateral to help him tape in along his side to aid in the pulling.

## 2018-06-21 ENCOUNTER — Inpatient Hospital Stay (HOSPITAL_BASED_OUTPATIENT_CLINIC_OR_DEPARTMENT_OTHER): Payer: Medicare HMO | Admitting: Oncology

## 2018-06-21 ENCOUNTER — Telehealth: Payer: Self-pay | Admitting: Oncology

## 2018-06-21 ENCOUNTER — Inpatient Hospital Stay: Payer: Medicare HMO

## 2018-06-21 ENCOUNTER — Encounter: Payer: Self-pay | Admitting: General Practice

## 2018-06-21 VITALS — BP 112/61 | HR 73 | Temp 97.8°F | Resp 17 | Ht 68.0 in | Wt 173.6 lb

## 2018-06-21 DIAGNOSIS — D649 Anemia, unspecified: Secondary | ICD-10-CM | POA: Diagnosis not present

## 2018-06-21 DIAGNOSIS — D6959 Other secondary thrombocytopenia: Secondary | ICD-10-CM

## 2018-06-21 DIAGNOSIS — R05 Cough: Secondary | ICD-10-CM

## 2018-06-21 DIAGNOSIS — Z5112 Encounter for antineoplastic immunotherapy: Secondary | ICD-10-CM | POA: Diagnosis not present

## 2018-06-21 DIAGNOSIS — L299 Pruritus, unspecified: Secondary | ICD-10-CM | POA: Diagnosis not present

## 2018-06-21 DIAGNOSIS — Z936 Other artificial openings of urinary tract status: Secondary | ICD-10-CM | POA: Diagnosis not present

## 2018-06-21 DIAGNOSIS — N1330 Unspecified hydronephrosis: Secondary | ICD-10-CM

## 2018-06-21 DIAGNOSIS — C786 Secondary malignant neoplasm of retroperitoneum and peritoneum: Secondary | ICD-10-CM | POA: Diagnosis not present

## 2018-06-21 DIAGNOSIS — C679 Malignant neoplasm of bladder, unspecified: Secondary | ICD-10-CM

## 2018-06-21 LAB — CMP (CANCER CENTER ONLY)
ALK PHOS: 65 U/L (ref 38–126)
ALT: 12 U/L (ref 0–44)
AST: 13 U/L — AB (ref 15–41)
Albumin: 3.8 g/dL (ref 3.5–5.0)
Anion gap: 9 (ref 5–15)
BUN: 27 mg/dL — AB (ref 8–23)
CALCIUM: 10.2 mg/dL (ref 8.9–10.3)
CHLORIDE: 109 mmol/L (ref 98–111)
CO2: 16 mmol/L — AB (ref 22–32)
CREATININE: 1.65 mg/dL — AB (ref 0.61–1.24)
GFR, EST NON AFRICAN AMERICAN: 37 mL/min — AB (ref >60)
GFR, Est AFR Am: 43 mL/min — ABNORMAL LOW (ref >60)
Glucose, Bld: 130 mg/dL — ABNORMAL HIGH (ref 70–99)
Potassium: 4 mmol/L (ref 3.5–5.1)
SODIUM: 134 mmol/L — AB (ref 135–145)
Total Bilirubin: 0.3 mg/dL (ref 0.3–1.2)
Total Protein: 7.5 g/dL (ref 6.5–8.1)

## 2018-06-21 LAB — CBC WITH DIFFERENTIAL (CANCER CENTER ONLY)
BASOS PCT: 1 %
Basophils Absolute: 0.1 10*3/uL (ref 0.0–0.1)
EOS ABS: 0.6 10*3/uL — AB (ref 0.0–0.5)
EOS PCT: 12 %
HCT: 26.9 % — ABNORMAL LOW (ref 38.4–49.9)
Hemoglobin: 8.9 g/dL — ABNORMAL LOW (ref 13.0–17.1)
LYMPHS ABS: 1.5 10*3/uL (ref 0.9–3.3)
Lymphocytes Relative: 29 %
MCH: 31.7 pg (ref 27.2–33.4)
MCHC: 33.2 g/dL (ref 32.0–36.0)
MCV: 95.7 fL (ref 79.3–98.0)
MONOS PCT: 8 %
Monocytes Absolute: 0.4 10*3/uL (ref 0.1–0.9)
Neutro Abs: 2.6 10*3/uL (ref 1.5–6.5)
Neutrophils Relative %: 50 %
Platelet Count: 82 10*3/uL — ABNORMAL LOW (ref 140–400)
RBC: 2.81 MIL/uL — ABNORMAL LOW (ref 4.20–5.82)
RDW: 16 % — ABNORMAL HIGH (ref 11.0–14.6)
WBC Count: 5.2 10*3/uL (ref 4.0–10.3)

## 2018-06-21 MED ORDER — HEPARIN SOD (PORK) LOCK FLUSH 100 UNIT/ML IV SOLN
500.0000 [IU] | Freq: Once | INTRAVENOUS | Status: AC | PRN
  Administered 2018-06-21: 500 [IU]
  Filled 2018-06-21: qty 5

## 2018-06-21 MED ORDER — SODIUM CHLORIDE 0.9 % IV SOLN
Freq: Once | INTRAVENOUS | Status: AC
  Administered 2018-06-21: 14:00:00 via INTRAVENOUS

## 2018-06-21 MED ORDER — SODIUM CHLORIDE 0.9 % IV SOLN
200.0000 mg | Freq: Once | INTRAVENOUS | Status: AC
  Administered 2018-06-21: 200 mg via INTRAVENOUS
  Filled 2018-06-21: qty 8

## 2018-06-21 MED ORDER — SODIUM CHLORIDE 0.9% FLUSH
10.0000 mL | INTRAVENOUS | Status: DC | PRN
  Administered 2018-06-21: 10 mL
  Filled 2018-06-21: qty 10

## 2018-06-21 NOTE — Patient Instructions (Signed)
[facility] Discharge Instructions for Patients Receiving Chemotherapy  Today you received the following chemotherapy agents:  Keytruda.  To help prevent nausea and vomiting after your treatment, we encourage you to take your nausea medication as directed.   If you develop nausea and vomiting that is not controlled by your nausea medication, call the clinic.   BELOW ARE SYMPTOMS THAT SHOULD BE REPORTED IMMEDIATELY:  *FEVER GREATER THAN 100.5 F  *CHILLS WITH OR WITHOUT FEVER  NAUSEA AND VOMITING THAT IS NOT CONTROLLED WITH YOUR NAUSEA MEDICATION  *UNUSUAL SHORTNESS OF BREATH  *UNUSUAL BRUISING OR BLEEDING  TENDERNESS IN MOUTH AND THROAT WITH OR WITHOUT PRESENCE OF ULCERS  *URINARY PROBLEMS  *BOWEL PROBLEMS  UNUSUAL RASH Items with * indicate a potential emergency and should be followed up as soon as possible.  Feel free to call the clinic should you have any questions or concerns. The clinic phone number is [phone].  Please show the CHEMO ALERT CARD at check-in to the Emergency Department and triage nurse.    

## 2018-06-21 NOTE — Addendum Note (Signed)
Addended by: Wyatt Portela on: 06/21/2018 02:11 PM   Modules accepted: Orders

## 2018-06-21 NOTE — Progress Notes (Signed)
[facility] Spiritual Care Note  Met with [name] and [name] in infusion for 90+ min, as they had much to process about how they are coping and struggling with stressors, including natural anger and resentment that come out during protracted hard times. They processed some with each other, demonstrating their compassion for one another while verbalizing their own challenges. They found particular value in reframing from different perspectives and finding ways to access humor, which has been harder to find recently. Both verbalized appreciation for the thoughtful, perspective-shifting support and look forward to future conversations.   Following for support, but please also page if immediate needs arise. Thank you.   [place], [place], [facility] Pager [phone] Voicemail [phone]

## 2018-06-21 NOTE — Progress Notes (Signed)
Hematology and Oncology Follow Up Visit  Charles Coffey [id number] 08-02-1935 83 y.o. 06/21/2018 12:37 PM Charles Coffey, MDFry, [name], MD   Principle Diagnosis: 83 year old man with metastatic urothelial carcinoma with lymphadenopathy arising from the genitourinary tract in the bladder.  He was diagnosed in November 2018 with his tumor is PDL 1 testing is positive with 25% CPS     Prior Therapy:  He is status post cystoscopy and transurethral resection of a bladder tumor and on 11/25/2017.  He is status post bilateral nephrostomy tube placement in November 2018.  He is status post diverting colostomy done on 12/01/2017 performed by Dr. Barry Dienes due to colonic obstruction.  Chemotherapy utilizing gemcitabine and carboplatin.  Day 1 of cycle 1 was given on December 30, 2017.  He completed 2 cycles of therapy.  Therapy discontinued based on his wishes to poor tolerance.  Current therapy: Pembrolizumab 200 mg every 3 weeks started on March 23, 2018.  He is status post 2 treatments.  Interim History: [name] is here for a follow-up visit.  Since the last visit, he reports few complaints although overall improvement in his overall health.  He has reported stable appetite and weight.  He is mental confusion has also improved at this time.  He has decreased on his pain medication and takes OxyContin only at nighttime which has improved his mental status and confusion.  He has reported some pruritus and skin irritation especially at nighttime.  He did not develop any rash or petechiae.  He has also been diagnosed with upper respiratory tract infection and pneumonia and was treated with Levaquin at that time.  He continues to have nighttime cough and congestion but has improved dramatically.  Denies any shortness of breath or dyspnea on exertion.  He does not report any headaches, blurry vision, syncope or seizures.  He denies any alteration in mental status or confusion.  He does not report any  fevers, chills, sweats.  Appetite and energy appeared reasonably maintained.  He does not report any chest pain, palpitation, orthopnea.  He denies any leg edema.  He denies any hemoptysis or wheezing.  He does not report any nausea, vomiting or abdominal pain.  He denies any hematuria.  He does not report any pathological fractures or worsening bone pain.  He does not report any skin rashes or lesions.  He denied any petechiae. He does not report any lymphadenopathy. Remaining review of systems is negative.  Medications: I have reviewed the patient's medication and discussed with the patient personally today. Current Outpatient Medications  Medication Sig Dispense Refill  . acetaminophen (TYLENOL) 500 MG tablet Take 1,000 mg by mouth every 8 (eight) hours as needed for mild pain or moderate pain.    . carvedilol (COREG) 6.25 MG tablet Take 1 tablet (6.25 mg total) 2 (two) times daily with a meal by mouth. 180 tablet 3  . dicyclomine (BENTYL) 10 MG capsule TAKE 1-2 BY MOUTH EVERY 6 HOURS AS NEEDED FOR RECTAL SPASMS. 90 capsule 2  . diphenhydramine-acetaminophen (TYLENOL PM EXTRA STRENGTH) 25-500 MG TABS tablet Take 1 tablet by mouth at bedtime as needed (pain,sleep).     . feeding supplement, ENSURE ENLIVE, (ENSURE ENLIVE) LIQD Take 237 mLs by mouth 2 (two) times daily between meals. 60 Bottle 0  . furosemide (LASIX) 40 MG tablet Take 1 tablet (40 mg total) by mouth 2 (two) times daily. 180 tablet 1  . guaiFENesin-codeine 100-10 MG/5ML syrup Take 5 mLs by mouth every 6 (six)  hours as needed for cough. 180 mL 0  . levofloxacin (LEVAQUIN) 500 MG tablet Take 1 tablet (500 mg total) by mouth daily. 7 tablet 0  . lidocaine-prilocaine (EMLA) cream Apply 1 application topically as needed. 30 g 0  . LINZESS 290 MCG CAPS capsule TAKE 1 CAP BY MOUTH ONCE DAILY 30 MIN PRIOR TO A MEAL. MUST HAVE OFFICE VISIT FOR FURTHER REFILLS 30 capsule 3  . megestrol (MEGACE) 40 MG/ML suspension TAKE 10ML BY MOUTH TWICE A DAY  240 mL 0  . mirabegron ER (MYRBETRIQ) 25 MG TB24 tablet Take 1 tablet (25 mg total) by mouth daily. 30 tablet 0  . mirtazapine (REMERON) 15 MG tablet Take 1 tablet (15 mg total) by mouth at bedtime. 30 tablet 2  . nitroGLYCERIN (NITROSTAT) 0.4 MG SL tablet Place 1 tablet (0.4 mg total) under the tongue every 5 (five) minutes as needed. 25 tablet 3  . oxyCODONE (OXYCONTIN) 10 mg 12 hr tablet Take 1 tablet (10 mg total) by mouth 2 (two) times daily. 60 tablet 0  . oxyCODONE-acetaminophen (PERCOCET) 10-325 MG tablet Take 1 tablet by mouth every 4 (four) hours as needed for pain. 60 tablet 0  . pramipexole (MIRAPEX) 1 MG tablet Take 1 mg by mouth daily.    . prochlorperazine (COMPAZINE) 10 MG tablet Take 1 tablet (10 mg total) by mouth every 6 (six) hours as needed for nausea or vomiting. 30 tablet 0  . ranitidine (ZANTAC) 300 MG tablet TAKE 1 TABLET BY MOUTH TWICE A DAY 60 tablet 3  . Tetrahydroz-Glyc-Hyprom-PEG (VISINE MAXIMUM REDNESS RELIEF OP) Place 2 drops into both eyes 2 (two) times daily as needed (dry eyes).     . traZODone (DESYREL) 50 MG tablet TAKE 1 TABLET BY MOUTH AT BEDTIME AS NEEDED FOR SLEEP. 90 tablet 3   No current facility-administered medications for this visit.      Allergies:  Allergies  Allergen Reactions  . Antihistamines, Loratadine-Type Other (See Comments)    Unable to urinate  . Statins Other (See Comments)    liver effects    Past Medical History, Surgical history, Social history, and Family History reviewed today and remain unchanged.   Physical Exam: Blood pressure 112/61, pulse 73, temperature 97.8 F (36.6 C), temperature source Oral, resp. rate 17, height 5\' 8"  (1.727 m), weight 173 lb 9.6 oz (78.7 kg), SpO2 100 %.   ECOG: 1 General appearance: Well appearing gentleman without distress  Head: Atraumatic without abnormalities. Oral mucosa: Mucous membranes are moist and pink. Eyes: Sclera anicteric.Marland Kitchen Lymph nodes: No lymphadenopathy noted in the  cervical, supraclavicular, or axillary regions Heart: Regular rate without any murmurs or gallops.  No leg edema. Lung: Clear in all lung fields without any wheezes or dullness to percussion. Abdomin: Soft, nontender without any rebound or guarding. Musculoskeletal: No joint deformity or effusion. Skin: No rashes or lesions. Psychiatric: Appropriate mood and affect. Neurological: No motor or sensory deficits.  Lab Results: Lab Results  Component Value Date   WBC 5.2 06/21/2018   HGB 8.9 (L) 06/21/2018   HCT 26.9 (L) 06/21/2018   MCV 95.7 06/21/2018   PLT 82 (L) 06/21/2018     Chemistry      Component Value Date/Time   NA 134 (L) 06/21/2018 1135   NA 138 02/24/2018 1646   NA 133 (L) 12/29/2017 1014   K 4.0 06/21/2018 1135   K 4.4 12/29/2017 1014   CL 109 06/21/2018 1135   CO2 16 (L) 06/21/2018 1135   CO2  20 (L) 12/29/2017 1014   BUN 27 (H) 06/21/2018 1135   BUN 25 02/24/2018 1646   BUN 31.5 (H) 12/29/2017 1014   CREATININE 1.65 (H) 06/21/2018 1135   CREATININE 1.6 (H) 12/29/2017 1014      Component Value Date/Time   CALCIUM 10.2 06/21/2018 1135   CALCIUM 9.6 12/29/2017 1014   ALKPHOS 65 06/21/2018 1135   ALKPHOS 61 12/29/2017 1014   AST 13 (L) 06/21/2018 1135   AST 15 12/29/2017 1014   ALT 12 06/21/2018 1135   ALT 13 12/29/2017 1014   BILITOT 0.3 06/21/2018 1135   BILITOT 0.43 12/29/2017 3561       83 year old man with:   1.  Advanced high-grade urothelial carcinoma with retroperitoneal lymph nodes metastasis arising from the upper left genitourinary tract. His tumor is PDL 1 positive with 25% CPS.  He has completed 2 cycles of Pembrolizumab without any major complications.  He does report some mild fatigue and pruritus but otherwise no other complaints.  Risks and benefits of continuing this treatment was reviewed today and is agreeable to proceed.  He will receive the third cycle today and will repeat imaging studies after that for staging purposes.  Alternative  therapies such as restarting systemic chemotherapy would be an option if he has progressed on this current therapy.   2.  Bilateral hydronephrosis: Nephrostomy tubes remain in place.  Dressing changes are done periodically and managed by interventional radiology.  3.  Thrombocytopenia: Related to his malignancy and previous chemotherapy.  Platelets continue to improve.  4.  IV access: Port-A-Cath remains without any complications and in use.  No issues reported.  5.  Pelvic pain: Manageable with OxyContin and breakthrough pain medication which has been used less and less at this time.  6.  Altered mental status: Improved with decreasing his narcotic use during daytime.  7.  Urological consideration: He has urology follow-up at [facility].  8.  Anemia: His hemoglobin today continues to be stable without any need for any transfusion.  9.  Anorexia: Appetite and weight is improved at this time.  10.  Prognosis and goals of care: His disease remains incurable and any treatment is palliative in nature.  His performance status remains adequate and aggressive therapy is warranted.  11.  Follow-up: In 4 weeks to follow his progress.  25 minutes was spent today with the patient face-to-face.  More than 50% was spent today on education, counseling and coordinating his future plan of care including alternative treatment options.  Zola Button, MD 6/25/201912:37 PM

## 2018-06-21 NOTE — Telephone Encounter (Signed)
Scheduled appt per 6/25 los - per patient request reschedule 7/18 appt to following - per FS okay with 7/26 - gave pt avs and calender per los.

## 2018-06-21 NOTE — Progress Notes (Signed)
Okay to treat with Creatinine of 1.65 and platelets of 82 per Dr. [name].

## 2018-06-24 DIAGNOSIS — K435 Parastomal hernia without obstruction or  gangrene: Secondary | ICD-10-CM | POA: Diagnosis not present

## 2018-06-28 ENCOUNTER — Other Ambulatory Visit: Payer: Self-pay | Admitting: Family Medicine

## 2018-06-28 NOTE — Telephone Encounter (Signed)
Last OV 02/14/2018   Sent to PCP to advise

## 2018-06-30 ENCOUNTER — Emergency Department (HOSPITAL_COMMUNITY): Payer: Medicare HMO

## 2018-06-30 ENCOUNTER — Encounter (HOSPITAL_COMMUNITY): Payer: Self-pay | Admitting: Emergency Medicine

## 2018-06-30 ENCOUNTER — Inpatient Hospital Stay (HOSPITAL_COMMUNITY)
Admission: EM | Admit: 2018-06-30 | Discharge: 2018-07-04 | DRG: 698 | Disposition: A | Discharge status: Home / Self Care | Payer: Medicare HMO | Attending: Internal Medicine | Admitting: Internal Medicine

## 2018-06-30 DIAGNOSIS — I4510 Unspecified right bundle-branch block: Secondary | ICD-10-CM | POA: Diagnosis present

## 2018-06-30 DIAGNOSIS — Z1624 Resistance to multiple antibiotics: Secondary | ICD-10-CM | POA: Diagnosis present

## 2018-06-30 DIAGNOSIS — K219 Gastro-esophageal reflux disease without esophagitis: Secondary | ICD-10-CM | POA: Diagnosis present

## 2018-06-30 DIAGNOSIS — Z87891 Personal history of nicotine dependence: Secondary | ICD-10-CM

## 2018-06-30 DIAGNOSIS — I252 Old myocardial infarction: Secondary | ICD-10-CM

## 2018-06-30 DIAGNOSIS — D631 Anemia in chronic kidney disease: Secondary | ICD-10-CM | POA: Diagnosis present

## 2018-06-30 DIAGNOSIS — T83512A Infection and inflammatory reaction due to nephrostomy catheter, initial encounter: Secondary | ICD-10-CM | POA: Diagnosis not present

## 2018-06-30 DIAGNOSIS — T83098A Other mechanical complication of other indwelling urethral catheter, initial encounter: Secondary | ICD-10-CM

## 2018-06-30 DIAGNOSIS — F410 Panic disorder [episodic paroxysmal anxiety] without agoraphobia: Secondary | ICD-10-CM | POA: Diagnosis present

## 2018-06-30 DIAGNOSIS — Z951 Presence of aortocoronary bypass graft: Secondary | ICD-10-CM

## 2018-06-30 DIAGNOSIS — R05 Cough: Secondary | ICD-10-CM

## 2018-06-30 DIAGNOSIS — Z79899 Other long term (current) drug therapy: Secondary | ICD-10-CM

## 2018-06-30 DIAGNOSIS — I2510 Atherosclerotic heart disease of native coronary artery without angina pectoris: Secondary | ICD-10-CM | POA: Diagnosis present

## 2018-06-30 DIAGNOSIS — K589 Irritable bowel syndrome without diarrhea: Secondary | ICD-10-CM | POA: Diagnosis present

## 2018-06-30 DIAGNOSIS — B964 Proteus (mirabilis) (morganii) as the cause of diseases classified elsewhere: Secondary | ICD-10-CM | POA: Diagnosis present

## 2018-06-30 DIAGNOSIS — R059 Cough, unspecified: Secondary | ICD-10-CM

## 2018-06-30 DIAGNOSIS — D649 Anemia, unspecified: Secondary | ICD-10-CM | POA: Diagnosis not present

## 2018-06-30 DIAGNOSIS — N390 Urinary tract infection, site not specified: Secondary | ICD-10-CM | POA: Diagnosis present

## 2018-06-30 DIAGNOSIS — N183 Chronic kidney disease, stage 3 unspecified: Secondary | ICD-10-CM | POA: Diagnosis present

## 2018-06-30 DIAGNOSIS — G2581 Restless legs syndrome: Secondary | ICD-10-CM | POA: Diagnosis present

## 2018-06-30 DIAGNOSIS — Z981 Arthrodesis status: Secondary | ICD-10-CM

## 2018-06-30 DIAGNOSIS — N3001 Acute cystitis with hematuria: Secondary | ICD-10-CM | POA: Diagnosis not present

## 2018-06-30 DIAGNOSIS — Z9221 Personal history of antineoplastic chemotherapy: Secondary | ICD-10-CM

## 2018-06-30 DIAGNOSIS — E785 Hyperlipidemia, unspecified: Secondary | ICD-10-CM | POA: Diagnosis present

## 2018-06-30 DIAGNOSIS — Y95 Nosocomial condition: Secondary | ICD-10-CM | POA: Diagnosis present

## 2018-06-30 DIAGNOSIS — I129 Hypertensive chronic kidney disease with stage 1 through stage 4 chronic kidney disease, or unspecified chronic kidney disease: Secondary | ICD-10-CM | POA: Diagnosis present

## 2018-06-30 DIAGNOSIS — Y846 Urinary catheterization as the cause of abnormal reaction of the patient, or of later complication, without mention of misadventure at the time of the procedure: Secondary | ICD-10-CM | POA: Diagnosis present

## 2018-06-30 DIAGNOSIS — Z8551 Personal history of malignant neoplasm of bladder: Secondary | ICD-10-CM

## 2018-06-30 DIAGNOSIS — N401 Enlarged prostate with lower urinary tract symptoms: Secondary | ICD-10-CM | POA: Diagnosis present

## 2018-06-30 DIAGNOSIS — I255 Ischemic cardiomyopathy: Secondary | ICD-10-CM | POA: Diagnosis present

## 2018-06-30 DIAGNOSIS — J189 Pneumonia, unspecified organism: Secondary | ICD-10-CM | POA: Diagnosis present

## 2018-06-30 DIAGNOSIS — I700 Atherosclerosis of aorta: Secondary | ICD-10-CM | POA: Diagnosis present

## 2018-06-30 DIAGNOSIS — R109 Unspecified abdominal pain: Secondary | ICD-10-CM | POA: Diagnosis not present

## 2018-06-30 DIAGNOSIS — D696 Thrombocytopenia, unspecified: Secondary | ICD-10-CM | POA: Diagnosis present

## 2018-06-30 DIAGNOSIS — Z79891 Long term (current) use of opiate analgesic: Secondary | ICD-10-CM

## 2018-06-30 DIAGNOSIS — Z933 Colostomy status: Secondary | ICD-10-CM

## 2018-06-30 DIAGNOSIS — N138 Other obstructive and reflux uropathy: Secondary | ICD-10-CM | POA: Diagnosis present

## 2018-06-30 LAB — COMPREHENSIVE METABOLIC PANEL
ALT: 15 U/L (ref 0–44)
AST: 15 U/L (ref 15–41)
Albumin: 3.3 g/dL — ABNORMAL LOW (ref 3.5–5.0)
Alkaline Phosphatase: 48 U/L (ref 38–126)
Anion gap: 8 (ref 5–15)
BUN: 33 mg/dL — ABNORMAL HIGH (ref 8–23)
CALCIUM: 9.5 mg/dL (ref 8.9–10.3)
CO2: 19 mmol/L — ABNORMAL LOW (ref 22–32)
CREATININE: 1.73 mg/dL — AB (ref 0.61–1.24)
Chloride: 111 mmol/L (ref 98–111)
GFR, EST AFRICAN AMERICAN: 41 mL/min — AB (ref >60)
GFR, EST NON AFRICAN AMERICAN: 35 mL/min — AB (ref >60)
Glucose, Bld: 114 mg/dL — ABNORMAL HIGH (ref 70–99)
Potassium: 4 mmol/L (ref 3.5–5.1)
Sodium: 138 mmol/L (ref 135–145)
Total Bilirubin: 0.4 mg/dL (ref 0.3–1.2)
Total Protein: 6.6 g/dL (ref 6.5–8.1)

## 2018-06-30 LAB — I-STAT CG4 LACTIC ACID, ED: Lactic Acid, Venous: 0.4 mmol/L — ABNORMAL LOW (ref 0.5–1.9)

## 2018-06-30 LAB — URINALYSIS, ROUTINE W REFLEX MICROSCOPIC
Bilirubin Urine: NEGATIVE
GLUCOSE, UA: NEGATIVE mg/dL
Ketones, ur: NEGATIVE mg/dL
NITRITE: NEGATIVE
RBC / HPF: >50 RBC/hpf — ABNORMAL HIGH (ref 0–5)
Specific Gravity, Urine: 1.016 (ref 1.005–1.030)
WBC, UA: >50 WBC/hpf — ABNORMAL HIGH (ref 0–5)
pH: 7 (ref 5.0–8.0)

## 2018-06-30 LAB — CBC WITH DIFFERENTIAL/PLATELET
Basophils Absolute: 0 10*3/uL (ref 0.0–0.1)
Basophils Relative: 0 %
Eosinophils Absolute: 0.8 10*3/uL — ABNORMAL HIGH (ref 0.0–0.7)
Eosinophils Relative: 15 %
HCT: 26.6 % — ABNORMAL LOW (ref 39.0–52.0)
Hemoglobin: 8.8 g/dL — ABNORMAL LOW (ref 13.0–17.0)
LYMPHS ABS: 1.4 10*3/uL (ref 0.7–4.0)
LYMPHS PCT: 24 %
MCH: 32 pg (ref 26.0–34.0)
MCHC: 33.1 g/dL (ref 30.0–36.0)
MCV: 96.7 fL (ref 78.0–100.0)
Monocytes Absolute: 0.4 10*3/uL (ref 0.1–1.0)
Monocytes Relative: 7 %
Neutro Abs: 3.1 10*3/uL (ref 1.7–7.7)
Neutrophils Relative %: 54 %
Platelets: 53 10*3/uL — ABNORMAL LOW (ref 150–400)
RBC: 2.75 MIL/uL — AB (ref 4.22–5.81)
RDW: 15.6 % — ABNORMAL HIGH (ref 11.5–15.5)
WBC: 5.8 10*3/uL (ref 4.0–10.5)

## 2018-06-30 MED ORDER — HYDROMORPHONE HCL 1 MG/ML IJ SOLN
INTRAMUSCULAR | Status: AC
  Filled 2018-06-30: qty 1

## 2018-06-30 MED ORDER — VANCOMYCIN HCL 10 G IV SOLR
1250.0000 mg | Freq: Once | INTRAVENOUS | Status: AC
  Administered 2018-06-30: 1250 mg via INTRAVENOUS
  Filled 2018-06-30: qty 1250

## 2018-06-30 MED ORDER — SODIUM CHLORIDE 0.9 % IV SOLN
1.0000 g | INTRAVENOUS | Status: DC
  Administered 2018-06-30: 1 g via INTRAVENOUS

## 2018-06-30 MED ORDER — ENSURE ENLIVE PO LIQD
237.0000 mL | Freq: Two times a day (BID) | ORAL | Status: DC
  Administered 2018-07-02: 237 mL via ORAL

## 2018-06-30 MED ORDER — OXYCODONE HCL ER 10 MG PO T12A
10.0000 mg | EXTENDED_RELEASE_TABLET | Freq: Two times a day (BID) | ORAL | Status: DC
  Administered 2018-07-01 – 2018-07-04 (×8): 10 mg via ORAL
  Filled 2018-06-30 (×8): qty 1

## 2018-06-30 MED ORDER — ONDANSETRON HCL 4 MG/2ML IJ SOLN
4.0000 mg | Freq: Once | INTRAMUSCULAR | Status: AC
  Administered 2018-06-30: 4 mg via INTRAVENOUS

## 2018-06-30 MED ORDER — HYDROCODONE-ACETAMINOPHEN 5-325 MG PO TABS
ORAL_TABLET | ORAL | Status: AC
  Filled 2018-06-30: qty 1

## 2018-06-30 MED ORDER — LINACLOTIDE 145 MCG PO CAPS
290.0000 ug | ORAL_CAPSULE | Freq: Every day | ORAL | Status: DC
  Administered 2018-07-01 – 2018-07-03 (×3): 290 ug via ORAL
  Filled 2018-06-30 (×4): qty 2

## 2018-06-30 MED ORDER — ACETAMINOPHEN 325 MG PO TABS: 650.0000 mg | ORAL_TABLET | Freq: Four times a day (QID) | ORAL | Status: DC | PRN

## 2018-06-30 MED ORDER — FAMOTIDINE 20 MG PO TABS
20.0000 mg | ORAL_TABLET | Freq: Every day | ORAL | Status: DC
  Administered 2018-07-01 – 2018-07-04 (×5): 20 mg via ORAL
  Filled 2018-06-30 (×5): qty 1

## 2018-06-30 MED ORDER — OXYCODONE-ACETAMINOPHEN 10-325 MG PO TABS: 1.0000 | ORAL_TABLET | ORAL | Status: DC | PRN

## 2018-06-30 MED ORDER — HYDROMORPHONE HCL 1 MG/ML IJ SOLN
1.0000 mg | Freq: Once | INTRAMUSCULAR | Status: AC
  Administered 2018-06-30: 1 mg via INTRAVENOUS

## 2018-06-30 MED ORDER — SODIUM CHLORIDE 0.9 % IV SOLN
INTRAVENOUS | Status: AC
  Administered 2018-06-30: via INTRAVENOUS

## 2018-06-30 MED ORDER — SODIUM CHLORIDE 0.9 % IV SOLN
INTRAVENOUS | Status: AC
  Filled 2018-06-30: qty 10

## 2018-06-30 MED ORDER — DIPHENHYDRAMINE HCL 25 MG PO CAPS
25.0000 mg | ORAL_CAPSULE | Freq: Every day | ORAL | Status: DC
  Administered 2018-07-01 – 2018-07-03 (×4): 25 mg via ORAL
  Filled 2018-06-30 (×4): qty 1

## 2018-06-30 MED ORDER — MIRTAZAPINE 15 MG PO TABS
15.0000 mg | ORAL_TABLET | Freq: Every day | ORAL | Status: DC
  Administered 2018-07-01 – 2018-07-03 (×4): 15 mg via ORAL
  Filled 2018-06-30 (×4): qty 1

## 2018-06-30 MED ORDER — LIDOCAINE-PRILOCAINE 2.5-2.5 % EX CREA
1.0000 "application " | TOPICAL_CREAM | CUTANEOUS | Status: DC | PRN
  Filled 2018-06-30: qty 5

## 2018-06-30 MED ORDER — TRAMADOL HCL 50 MG PO TABS: 50.0000 mg | ORAL_TABLET | Freq: Two times a day (BID) | ORAL | Status: DC | PRN

## 2018-06-30 MED ORDER — NAPHAZOLINE-PHENIRAMINE 0.025-0.3 % OP SOLN
1.0000 [drp] | Freq: Two times a day (BID) | OPHTHALMIC | Status: DC | PRN
  Filled 2018-06-30: qty 15

## 2018-06-30 MED ORDER — SODIUM CHLORIDE 0.9 % IV SOLN
1.0000 g | Freq: Two times a day (BID) | INTRAVENOUS | Status: DC
  Administered 2018-07-01 – 2018-07-02 (×5): 1 g via INTRAVENOUS
  Filled 2018-06-30 (×6): qty 1

## 2018-06-30 MED ORDER — SODIUM CHLORIDE 0.9 % IV SOLN
INTRAVENOUS | Status: DC
  Administered 2018-06-30 – 2018-07-02 (×5): via INTRAVENOUS

## 2018-06-30 MED ORDER — MIRABEGRON ER 25 MG PO TB24
25.0000 mg | ORAL_TABLET | Freq: Every day | ORAL | Status: DC
  Administered 2018-07-01 – 2018-07-04 (×4): 25 mg via ORAL
  Filled 2018-06-30 (×4): qty 1

## 2018-06-30 MED ORDER — DICYCLOMINE HCL 10 MG PO CAPS: 10.0000 mg | ORAL_CAPSULE | Freq: Every evening | ORAL | Status: DC | PRN

## 2018-06-30 MED ORDER — ONDANSETRON HCL 4 MG/2ML IJ SOLN
INTRAMUSCULAR | Status: AC
  Filled 2018-06-30: qty 2

## 2018-06-30 MED ORDER — PROCHLORPERAZINE MALEATE 10 MG PO TABS: 10.0000 mg | ORAL_TABLET | Freq: Four times a day (QID) | ORAL | Status: DC | PRN

## 2018-06-30 MED ORDER — PRAMIPEXOLE DIHYDROCHLORIDE 1 MG PO TABS
1.5000 mg | ORAL_TABLET | Freq: Every day | ORAL | Status: DC
  Administered 2018-07-01 – 2018-07-03 (×4): 1.5 mg via ORAL
  Filled 2018-06-30 (×5): qty 2

## 2018-06-30 MED ORDER — ACETAMINOPHEN 650 MG RE SUPP: 650.0000 mg | Freq: Four times a day (QID) | RECTAL | Status: DC | PRN

## 2018-06-30 NOTE — ED Triage Notes (Signed)
Patient c/o generalized body aches, abdominal pain and back pain. Hx bladder cancer. Last chemo last Tuesday.

## 2018-06-30 NOTE — H&P (Signed)
TRH H&P   Patient Demographics:    Charles Coffey, is a 83 y.o. male  MRN: [id number]   DOB - [birth year]  Admit Date - 06/30/2018  Outpatient Primary MD for the patient is [name], MD  Referring MD/NP/PA: Vivi Martens  Outpatient Specialists:    Zola Button Manson Allan  Patient coming from: home  Chief Complaint  Patient presents with  . Abdominal Pain      HPI:    Charles Coffey  is a 83 y.o. male, w hypertension, hyperlipidemia, CAD s/p MI, RLS, stage 4, bladder cancer s/p resection (11/25/2017), chemo, and bilateral nephrostomy tube 2018, diverting colostomy 12/01/2017 secondary to colonic obstruction apparently presents with c/o bilateral flank pain today and therefore presented to Ed. Pt has slight dry cough.  Denies fever, chills, cp, palp, sob, n/v, blood in colostomy.  Pt presented to ED for evaluation of bilateral flank pain.     In Ed,  CXR  IMPRESSION: Cardiac enlargement with mild pulmonary vascular congestion. No pulmonary edema or consolidation. Aortic atherosclerosis.  CT renal stone IMPRESSION: 1. New tree-in-bud nodularity in both lower lungs highly suspiciousfor acute bilateral lower lobe distal airway infection. No pleural effusion. 2. Left nephrostomy tube exchange since March. The left nephrostomy loop now projects more centrally within the renal sinus, but no acute obstruction of the left kidney is evident. Stable positioning of right nephroureteral stent with no adverse features. 3. Regressed but not resolved perirectal and presacral soft tissue thickening and stranding, probably treatment related. 4. No new abnormality identified in the noncontrast abdomen or pelvis.  Urinalysis wbc >50, tbc >50  Wbc 5.8, Hgb 8.8, Plt 53 Na 138, K 4.0, Bun 33, Creatinine 1.73 Ast 15, Alt 15    lactic acid 0.4  Pt will be admitted for  uti and possibly pneumonia.      Review of systems:    In addition to the HPI above, No Fever-chills, No Headache, No changes with Vision or hearing, No problems swallowing food or Liquids, No Chest pain, Cough or Shortness of Breath, No Abdominal pain, No Nausea or Vommitting, Bowel movements are regular, No Blood in stool or Urine,   No new skin rashes or bruises, No new joints pains-aches,  No new weakness, tingling, numbness in any extremity, No recent weight gain or loss, No polyuria, polydypsia or polyphagia, No significant Mental Stressors.  A full 10 point Review of Systems was done, except as stated above, all other Review of Systems were negative.   With Past History of the following :    Past Medical History:  Diagnosis Date  . Aortic atherosclerosis ([place])   . BPH with urinary obstruction   . CAD (coronary artery disease)    a.  MI 1995, CABG x 3 2002 (patient says that he had LIMA and RIMA grafts). b. ETT-Cardiolite (10/15) with EF 48%, apical scar,  no ischemia. c. Nuc 07/2017 abnormal -> cath was performed,  patent LIMA to LAD and SVG to OM 2. RIMA to RCA is atretic. The right coronary artery is occluded distally with extensive collaterals from the LAD, medical therapy.   . Cancer [facility])    bladder cancer  . Cervical spondylosis without myelopathy 23-Nov-202017  . Chronic low back pain    sees Dr. Kary Kos   . GERD (gastroesophageal reflux disease)   . Hiatal hernia   . Hyperlipidemia   . Hypertension   . IBS (irritable bowel syndrome)   . Ischemic cardiomyopathy   . Myocardial infarction ([place])   . RBBB   . Restless legs syndrome   . Statin intolerance   . Syncope    a. in 2015 - no apparent cause, was taking sleep medicine at the time. Cardiac workup unremarkable.  . Tubular adenoma of colon       Past Surgical History:  Procedure Laterality Date  . COLONOSCOPY  10/17/2014   per Dr. Hilarie Fredrickson, tubular adenomas, repeat in 3 yrs   . COLONOSCOPY WITH  PROPOFOL N/A 12/01/2017   Procedure: COLONOSCOPY WITH PROPOFOL;  Surgeon: Milus Banister, MD;  Location: [facility];  Service: Endoscopy;  Laterality: N/A;  . CYSTOSCOPY W/ RETROGRADES Left 11/25/2017   Procedure: CYSTOSCOPY WITH RETROGRADE PYELOGRAM;  Surgeon: Irine Seal, MD;  Location: [facility];  Service: Urology;  Laterality: Left;  . HEART BYPASS    . IR CONVERT RIGHT NEPHROSTOMY TO NEPHROURETERAL CATH  02/04/2018  . IR EXT NEPHROURETERAL CATH EXCHANGE  04/11/2018  . IR FLUORO GUIDE CV LINE RIGHT  12/27/2017  . IR NEPHROSTOGRAM LEFT THRU EXISTING ACCESS  12/24/2017  . IR NEPHROSTOMY EXCHANGE LEFT  01/28/2018  . IR NEPHROSTOMY EXCHANGE LEFT  02/04/2018  . IR NEPHROSTOMY EXCHANGE LEFT  02/25/2018  . IR NEPHROSTOMY EXCHANGE RIGHT  12/24/2017  . IR NEPHROSTOMY EXCHANGE RIGHT  01/28/2018  . IR NEPHROSTOMY EXCHANGE RIGHT  04/11/2018  . IR NEPHROSTOMY PLACEMENT LEFT  11/28/2017  . IR NEPHROSTOMY PLACEMENT RIGHT  12/03/2017  . IR PATIENT EVAL TECH 0-60 MINS  03/24/2018  . IR PATIENT EVAL TECH 0-60 MINS  03/28/2018  . IR PATIENT EVAL TECH 0-60 MINS  04/25/2018  . IR PATIENT EVAL TECH 0-60 MINS  06/20/2018  . IR US GUIDE VASC ACCESS RIGHT  12/27/2017  . LAPAROSCOPY N/A 12/01/2017   Procedure: LAPAROSCOPIC DIVERTING OSTOMY;  Surgeon: Stark Klein, MD;  Location: [facility];  Service: General;  Laterality: N/A;  . LEFT HEART CATH AND CORS/GRAFTS ANGIOGRAPHY N/A 08/25/2017   Procedure: LEFT HEART CATH AND CORS/GRAFTS ANGIOGRAPHY;  Surgeon: Wellington Hampshire, MD;  Location: [facility];  Service: Cardiovascular;  Laterality: N/A;  . LUMBAR FUSION  2003   L3-L4  . PROSTATE SURGERY  06-27-12   per Dr. Roni Bread, had CTT  . TONSILLECTOMY    . TRANSURETHRAL RESECTION OF BLADDER TUMOR N/A 11/25/2017   Procedure: TRANSURETHRAL RESECTION OF BLADDER TUMOR (TURBT);  Surgeon: Irine Seal, MD;  Location: [facility];  Service: Urology;  Laterality: N/A;      Social History:     Social History   Tobacco Use  . Smoking  status: Former Smoker    Types: Cigarettes    Last attempt to quit: 12/28/1978    Years since quitting: 39.5  . Smokeless tobacco: Never Used  Substance Use Topics  . Alcohol use: Yes    Alcohol/week: 1.2 oz    Types: 1 Glasses of wine, 1 Cans  of beer per week    Comment: occassional     Lives - at home  Mobility - walks by self   Family History :     Family History  Problem Relation Age of Onset  . Heart attack Mother   . Aneurysm Father        femoral artery  . Heart disease Brother   . Heart disease Maternal Uncle        x 2  . Colon cancer Neg Hx   . Esophageal cancer Neg Hx   . Pancreatic cancer Neg Hx   . Kidney disease Neg Hx   . Liver disease Neg Hx        Home Medications:   Prior to Admission medications   Medication Sig Start Date End Date Taking? Authorizing Provider  acetaminophen (TYLENOL) 500 MG tablet Take 1,000 mg by mouth every 8 (eight) hours as needed for mild pain or moderate pain.   Yes [provider]  carvedilol (COREG) 6.25 MG tablet Take 1 tablet (6.25 mg total) 2 (two) times daily with a meal by mouth. 11/09/17  Yes Larey Dresser, MD  dicyclomine (BENTYL) 10 MG capsule TAKE 1-2 BY MOUTH EVERY 6 HOURS AS NEEDED FOR RECTAL SPASMS. 03/07/18  Yes Pyrtle, Lajuan Lines, MD  diphenhydrAMINE (BENADRYL) 50 MG tablet Take 100 mg by mouth at bedtime.   Yes [provider]  feeding supplement, ENSURE ENLIVE, (ENSURE ENLIVE) LIQD Take 237 mLs by mouth 2 (two) times daily between meals. 02/03/18  Yes Short, Noah Delaine, MD  furosemide (LASIX) 40 MG tablet Take 1 tablet (40 mg total) by mouth 2 (two) times daily. Patient taking differently: Take 40 mg by mouth as needed for fluid or edema.  05/13/18  Yes End, Harrell Gave, MD  lidocaine-prilocaine (EMLA) cream Apply 1 application topically as needed. 12/15/17  Yes Shadad, Mathis Dad, MD  LINZESS 290 MCG CAPS capsule TAKE 1 CAP BY MOUTH ONCE DAILY 30 MIN PRIOR TO A MEAL. MUST HAVE OFFICE VISIT FOR FURTHER  REFILLS 05/12/18  Yes Pyrtle, Lajuan Lines, MD  megestrol (MEGACE) 40 MG/ML suspension TAKE 10ML BY MOUTH TWICE A DAY 05/13/18  Yes Shadad, Mathis Dad, MD  mirabegron ER (MYRBETRIQ) 25 MG TB24 tablet Take 1 tablet (25 mg total) by mouth daily. 02/04/18  Yes Short, Noah Delaine, MD  mirtazapine (REMERON) 15 MG tablet Take 1 tablet (15 mg total) by mouth at bedtime. 05/30/18  Yes Tanner, Lyndon Code., PA-C  nitroGLYCERIN (NITROSTAT) 0.4 MG SL tablet Place 1 tablet (0.4 mg total) under the tongue every 5 (five) minutes as needed. 08/19/17 06/30/18 Yes Dunn, Dayna N, PA-C  oxyCODONE (OXYCONTIN) 10 mg 12 hr tablet Take 1 tablet (10 mg total) by mouth 2 (two) times daily. 06/02/18  Yes Wyatt Portela, MD  oxyCODONE-acetaminophen (PERCOCET) 10-325 MG tablet Take 1 tablet by mouth every 4 (four) hours as needed for pain. 05/16/18  Yes Wyatt Portela, MD  pramipexole (MIRAPEX) 1 MG tablet TAKE 1 AND 1/2 TABLETS BY MOUTH AT BEDTIME 06/29/18  Yes [name], MD  prochlorperazine (COMPAZINE) 10 MG tablet Take 1 tablet (10 mg total) by mouth every 6 (six) hours as needed for nausea or vomiting. 12/15/17  Yes Wyatt Portela, MD  ranitidine (ZANTAC) 300 MG tablet TAKE 1 TABLET BY MOUTH TWICE A DAY 02/10/18  Yes Pyrtle, Lajuan Lines, MD  Tetrahydroz-Glyc-Hyprom-PEG (VISINE MAXIMUM REDNESS RELIEF OP) Place 2 drops into both eyes 2 (two) times daily as needed (dry eyes).  Yes [provider]  traMADol (ULTRAM) 50 MG tablet Take 50 mg by mouth as needed for moderate pain.   Yes [provider]  guaiFENesin-codeine 100-10 MG/5ML syrup Take 5 mLs by mouth every 6 (six) hours as needed for cough. Patient not taking: Reported on 06/30/2018 06/13/18   Harle Stanford., PA-C  levofloxacin (LEVAQUIN) 500 MG tablet Take 1 tablet (500 mg total) by mouth daily. Patient not taking: Reported on 06/30/2018 06/13/18   Sandi Mealy E., PA-C  traZODone (DESYREL) 50 MG tablet TAKE 1 TABLET BY MOUTH AT BEDTIME AS NEEDED FOR SLEEP. Patient not taking:  Reported on 06/30/2018 04/14/18   [name], MD     Allergies:     Allergies  Allergen Reactions  . Antihistamines, Loratadine-Type Other (See Comments)    Unable to urinate  . Statins Other (See Comments)    liver effects     Physical Exam:   Vitals  Blood pressure 105/90, pulse 77, temperature 98.3 F (36.8 C), temperature source Oral, resp. rate 14, height 5\' 8"  (1.727 m), weight 77.1 kg (170 lb), SpO2 100 %.   1. General  lying in bed in NAD,   2. Normal affect and insight, Not Suicidal or Homicidal, Awake Alert, Oriented X 3.  3. No F.N deficits, ALL C.Nerves Intact, Strength 5/5 all 4 extremities, Sensation intact all 4 extremities, Plantars down going.  4. Ears and Eyes appear Normal, Conjunctivae clear, PERRLA. Moist Oral Mucosa.  5. Supple Neck, No JVD, No cervical lymphadenopathy appriciated, No Carotid Bruits.  6. Symmetrical Chest wall movement, Good air movement bilaterally, CTAB.  7. RRR, No Gallops, Rubs or Murmurs, No Parasternal Heave.  8. Positive Bowel Sounds, Abdomen Soft, No tenderness, No organomegaly appriciated,No rebound -guarding or rigidity.  9.  No Cyanosis, Normal Skin Turgor, No Skin Rash or Bruise.  10. Good muscle tone,  joints appear normal , no effusions, Normal ROM.  11. No Palpable Lymph Nodes in Neck or Axillae  No cva tenderness    Data Review:    CBC Recent Labs  Lab 06/30/18 2009  WBC 5.8  HGB 8.8*  HCT 26.6*  PLT 53*  MCV 96.7  MCH 32.0  MCHC 33.1  RDW 15.6*  LYMPHSABS 1.4  MONOABS 0.4  EOSABS 0.8*  BASOSABS 0.0   ------------------------------------------------------------------------------------------------------------------  Chemistries  Recent Labs  Lab 06/30/18 2009  NA 138  K 4.0  CL 111  CO2 19*  GLUCOSE 114*  BUN 33*  CREATININE 1.73*  CALCIUM 9.5  AST 15  ALT 15  ALKPHOS 48  BILITOT 0.4    ------------------------------------------------------------------------------------------------------------------ estimated creatinine clearance is 32.4 mL/min (A) (by C-G formula based on SCr of 1.73 mg/dL (H)). ------------------------------------------------------------------------------------------------------------------ No results for input(s): TSH, T4TOTAL, T3FREE, THYROIDAB in the last 72 hours.  Invalid input(s): FREET3  Coagulation profile No results for input(s): INR, PROTIME in the last 168 hours. ------------------------------------------------------------------------------------------------------------------- No results for input(s): DDIMER in the last 72 hours. -------------------------------------------------------------------------------------------------------------------  Cardiac Enzymes No results for input(s): CKMB, TROPONINI, MYOGLOBIN in the last 168 hours.  Invalid input(s): CK ------------------------------------------------------------------------------------------------------------------    Component Value Date/Time   BNP 1,472.0 (H) 01/31/2018 0153     ---------------------------------------------------------------------------------------------------------------  Urinalysis    Component Value Date/Time   COLORURINE AMBER (A) 06/30/2018 1949   APPEARANCEUR CLEAR 06/30/2018 1949   LABSPEC 1.016 06/30/2018 1949   PHURINE 7.0 06/30/2018 1949   GLUCOSEU NEGATIVE 06/30/2018 1949   HGBUR LARGE (A) 06/30/2018 1949   HGBUR negative 10/15/2010 1251  BILIRUBINUR NEGATIVE 06/30/2018 1949   BILIRUBINUR n 12/01/2016 1521   KETONESUR NEGATIVE 06/30/2018 1949   PROTEINUR >=300 (A) 06/30/2018 1949   UROBILINOGEN 0.2 12/01/2016 1521   UROBILINOGEN 0.2 10/15/2010 1251   NITRITE NEGATIVE 06/30/2018 1949   LEUKOCYTESUR LARGE (A) 06/30/2018 1949     ----------------------------------------------------------------------------------------------------------------   Imaging Results:    Dg Chest 2 View  Result Date: 06/30/2018 CLINICAL DATA:  Cough. Body aches. Abdominal pain. Back pain. History of bladder cancer with last chemotherapy Tuesday. Cough for couple of days. Previous smoker. History of hypertension. EXAM: CHEST - 2 VIEW COMPARISON:  06/13/2018 FINDINGS: Postoperative changes in the mediastinum. Power port type central venous catheter with tip over the cavoatrial junction region. No pneumothorax. Cardiac enlargement. Mild central pulmonary vascular congestion. No edema or consolidation in the lungs. No blunting of costophrenic angles. No pneumothorax. Calcification of the aorta. Mediastinal contours appear intact. IMPRESSION: Cardiac enlargement with mild pulmonary vascular congestion. No pulmonary edema or consolidation. Aortic atherosclerosis. Electronically Signed   By: Lucienne Capers M.D.   On: 06/30/2018 21:38   Ct Renal Stone Study  Result Date: 06/30/2018 CLINICAL DATA:  83 year old male with flank pain, abdominal and back pain. Bladder cancer with most recent chemotherapy last week. EXAM: CT ABDOMEN AND PELVIS WITHOUT CONTRAST TECHNIQUE: Multidetector CT imaging of the abdomen and pelvis was performed following the standard protocol without IV contrast. COMPARISON:  Restaging noncontrast CT chest abdomen and pelvis 03/10/2018 and earlier. FINDINGS: Lower chest: New tree-in-bud nodularity in both lower lobes (series 6, image 22). No lower lobe consolidation or pleural effusion. Small right middle lobe lung nodules are stable on series 6 images 3 and 7. No pericardial effusion. Stable cardiac size at the upper limits of normal. Hepatobiliary: Negative noncontrast liver and gallbladder. Pancreas: Negative. Spleen: Negative. Adrenals/Urinary Tract: Adrenal glands remain normal. Chronic partial left renal atrophy with chronic left  nephrostomy tube. The nephrostomy tube has been exchanged since 03/10/2018, and the nephrostomy loop is now located in the left renal sinus. The left renal pelvis size has not significantly changed. Chronic stranding at the left ureteropelvic junction and along the course of the left ureter is stable. No left hydroureter identified. Chronic right nephroureteral stent is in place and appears appropriately positioned. Trace gas within the nondilated right renal collecting system is noted on series 2, image 29. Stable right renal pelvis size. Stable right ureter. Diminutive, decompressed urinary bladder. Bladder wall thickening, but no discrete bladder mass is evident in the absence of contrast. Stomach/Bowel: Regressed but not resolved rectal wall thickening which may be treatment related. Decompressed rectum and sigmoid colon. Mild sigmoid diverticulosis. Decompressed left colon and splenic flexure. The patient has a chronic transverse colostomy with mucous fistula. Negative transverse and right colon aside from some retained stool. Appendix is diminutive or absent. Negative terminal ileum. No dilated small bowel. Negative stomach and duodenum. No abdominal free air, free fluid. Vascular/Lymphatic: Vascular patency is not evaluated in the absence of IV contrast. Aortoiliac calcified atherosclerosis. No acute lymphadenopathy. Occasional calcified retroperitoneal and mesenteric lymph nodes are stable. Reproductive: Stable and negative (possible small fat containing left inguinal hernia). Other: Regressed but not resolved perirectal and presacral soft tissue thickening and stranding, probably treatment related (series 2, image 74). No pelvic free fluid. Musculoskeletal: Chronic lumbar spine degeneration and prior fusion. No acute or suspicious osseous lesion identified. IMPRESSION: 1. New tree-in-bud nodularity in both lower lungs highly suspicious for acute bilateral lower lobe distal airway infection. No pleural  effusion. 2. Left  nephrostomy tube exchange since March. The left nephrostomy loop now projects more centrally within the renal sinus, but no acute obstruction of the left kidney is evident. Stable positioning of right nephroureteral stent with no adverse features. 3. Regressed but not resolved perirectal and presacral soft tissue thickening and stranding, probably treatment related. 4. No new abnormality identified in the noncontrast abdomen or pelvis. 5. Aortic Atherosclerosis (ICD10-I70.0). Electronically Signed   By: Genevie Ann M.D.   On: 06/30/2018 20:58     Assessment & Plan:    Principal Problem:   UTI (urinary tract infection) Active Problems:   Anemia   Chronic kidney disease, stage 3 (HCC)   Thrombocytopenia (HCC)    UTI Blood culture x2 Urine culture pending Start vanco iv,  cefepime iv pharmacy to dose  ? Pneumonia Blood culture x2 Urine strep antigen Urine legionella antigen Vanco iv, cefepime iv pharmacy to dose for Hcap  Anemia/ Thrombocytopenia Check cbc in am Avoid heparin products  CKD stage3 Check cmp in am  RLS Cont mirapex 1.5mg  po qhs  Gerd Cont H2 blocker  Constipation Cont linzess  CAD Cont carvedilol Cont lasix     DVT Prophylaxis   SCDs  AM Labs Ordered, also please review Full Orders  Family Communication: Admission, patients condition and plan of care including tests being ordered have been discussed with the patient  who indicate understanding and agree with the plan and Code Status.  Code Status  FULL CODE  Likely DC to  home  Condition GUARDED    Consults called:   none  Admission status: observation  Time spent in minutes : 60   Jani Gravel M.D on 06/30/2018 at 10:12 PM  Between 7am to 7pm - Pager - [phone]   After 7pm go to [email or web address] - password [facility]  [facility] - Office  [phone]

## 2018-06-30 NOTE — ED Notes (Signed)
ED TO INPATIENT HANDOFF REPORT  Name/Age/Gender Charles Coffey 83 y.o. male  Code Status Code Status History    Date Active Date Inactive Code Status Order ID Comments User Context   02/17/2018 0045 02/17/2018 2003 Full Code [id number]  Etta Quill, DO ED   01/31/2018 0911 02/05/2018 1516 Full Code [id number]  Phillips Grout, MD Inpatient   11/28/2017 2136 12/04/2017 2050 Full Code [id number]  Eugenie Filler, MD ED   11/25/2017 1510 11/26/2017 1608 Full Code [id number]  Irine Seal, MD Inpatient   08/25/2017 1845 08/26/2017 1906 Full Code [id number]  Wellington Hampshire, MD Inpatient      Home/SNF/Other Home  Chief Complaint Abdominal Pain  Level of Care/Admitting Diagnosis ED Disposition    ED Disposition Condition [facility] Area: [facility] [[id number]]  Level of Care: Telemetry [5]  Admit to tele based on following criteria: Monitor for Ischemic changes  Diagnosis: UTI (urinary tract infection) [449675]  Admitting Physician: Jani Gravel [[id number]]  Attending Physician: Jani Gravel [[id number]]  PT Class (Do Not Modify): Observation [104]  PT Acc Code (Do Not Modify): Observation [10022]       Medical History Past Medical History:  Diagnosis Date  . Aortic atherosclerosis ([place])   . BPH with urinary obstruction   . CAD (coronary artery disease)    a.  MI 1995, CABG x 3 2002 (patient says that he had LIMA and RIMA grafts). b. ETT-Cardiolite (10/15) with EF 48%, apical scar, no ischemia. c. Nuc 07/2017 abnormal -> cath was performed,  patent LIMA to LAD and SVG to OM 2. RIMA to RCA is atretic. The right coronary artery is occluded distally with extensive collaterals from the LAD, medical therapy.   . Cancer [facility])    bladder cancer  . Cervical spondylosis without myelopathy 2020/03/2016  . Chronic low back pain    sees Dr. Kary Kos   . GERD (gastroesophageal reflux disease)   . Hiatal hernia   . Hyperlipidemia   . Hypertension   . IBS (irritable bowel  syndrome)   . Ischemic cardiomyopathy   . Myocardial infarction ([place])   . RBBB   . Restless legs syndrome   . Statin intolerance   . Syncope    a. in 2015 - no apparent cause, was taking sleep medicine at the time. Cardiac workup unremarkable.  . Tubular adenoma of colon     Allergies Allergies  Allergen Reactions  . Antihistamines, Loratadine-Type Other (See Comments)    Unable to urinate  . Statins Other (See Comments)    liver effects    IV Location/Drains/Wounds Patient Lines/Drains/Airways Status   Active Line/Drains/Airways    Name:   Placement date:   Placement time:   Site:   Days:   Implanted Port 12/27/17 Right Chest   12/27/17    1630    Chest   185   Nephrostomy Right 10.2 Fr.   02/04/18    1745    Right   146   Nephrostomy Left 10 Fr.   02/25/18    0907    Left   125   Colostomy LLQ   -    -    LLQ      Urethral Catheter Dr. Alyson Ingles Coude 18 Fr.   02/03/18    -    Coude   147          Labs/Imaging Results for orders placed or performed during the hospital encounter of 06/30/18 (  from the past 48 hour(s))  Urinalysis, Routine w reflex microscopic     Status: Abnormal   Collection Time: 06/30/18  7:49 PM  Result Value Ref Range   Color, Urine AMBER (A) YELLOW    Comment: BIOCHEMICALS MAY BE AFFECTED BY COLOR   APPearance CLEAR CLEAR   Specific Gravity, Urine 1.016 1.005 - 1.030   pH 7.0 5.0 - 8.0   Glucose, UA NEGATIVE NEGATIVE mg/dL   Hgb urine dipstick LARGE (A) NEGATIVE   Bilirubin Urine NEGATIVE NEGATIVE   Ketones, ur NEGATIVE NEGATIVE mg/dL   Protein, ur >=300 (A) NEGATIVE mg/dL   Nitrite NEGATIVE NEGATIVE   Leukocytes, UA LARGE (A) NEGATIVE   RBC / HPF >50 (H) 0 - 5 RBC/hpf   WBC, UA >50 (H) 0 - 5 WBC/hpf   Bacteria, UA MANY (A) NONE SEEN   Squamous Epithelial / LPF 0-5 0 - 5   Mucus PRESENT    Hyaline Casts, UA PRESENT     Comment: Performed at [facility], [place] [address], [place], [place] [address]  CBC with  Differential/Platelet     Status: Abnormal   Collection Time: 06/30/18  8:09 PM  Result Value Ref Range   WBC 5.8 4.0 - 10.5 K/uL   RBC 2.75 (L) 4.22 - 5.81 MIL/uL   Hemoglobin 8.8 (L) 13.0 - 17.0 g/dL   HCT 26.6 (L) 39.0 - 52.0 %   MCV 96.7 78.0 - 100.0 fL   MCH 32.0 26.0 - 34.0 pg   MCHC 33.1 30.0 - 36.0 g/dL   RDW 15.6 (H) 11.5 - 15.5 %   Platelets 53 (L) 150 - 400 K/uL    Comment: REPEATED TO VERIFY SPECIMEN CHECKED FOR CLOTS PLATELET COUNT CONFIRMED BY SMEAR    Neutrophils Relative % 54 %   Neutro Abs 3.1 1.7 - 7.7 K/uL   Lymphocytes Relative 24 %   Lymphs Abs 1.4 0.7 - 4.0 K/uL   Monocytes Relative 7 %   Monocytes Absolute 0.4 0.1 - 1.0 K/uL   Eosinophils Relative 15 %   Eosinophils Absolute 0.8 (H) 0.0 - 0.7 K/uL   Basophils Relative 0 %   Basophils Absolute 0.0 0.0 - 0.1 K/uL    Comment: Performed at [facility], [place] [address], [place], [place] [address]  Comprehensive metabolic panel     Status: Abnormal   Collection Time: 06/30/18  8:09 PM  Result Value Ref Range   Sodium 138 135 - 145 mmol/L   Potassium 4.0 3.5 - 5.1 mmol/L   Chloride 111 98 - 111 mmol/L    Comment: Please note change in reference range.   CO2 19 (L) 22 - 32 mmol/L   Glucose, Bld 114 (H) 70 - 99 mg/dL    Comment: Please note change in reference range.   BUN 33 (H) 8 - 23 mg/dL    Comment: Please note change in reference range.   Creatinine, Ser 1.73 (H) 0.61 - 1.24 mg/dL   Calcium 9.5 8.9 - 10.3 mg/dL   Total Protein 6.6 6.5 - 8.1 g/dL   Albumin 3.3 (L) 3.5 - 5.0 g/dL   AST 15 15 - 41 U/L   ALT 15 0 - 44 U/L    Comment: Please note change in reference range.   Alkaline Phosphatase 48 38 - 126 U/L   Total Bilirubin 0.4 0.3 - 1.2 mg/dL   GFR calc non Af Amer 35 (L) >60 mL/min   GFR calc Af Amer 41 (L) >  60 mL/min    Comment: (NOTE) The eGFR has been calculated using the CKD EPI equation. This calculation has not been validated in all clinical situations. eGFR's  persistently <60 mL/min signify possible Chronic Kidney Disease.    Anion gap 8 5 - 15    Comment: Performed at [facility], [place] [address], [place], [place] [address]  I-Stat CG4 Lactic Acid, ED     Status: Abnormal   Collection Time: 06/30/18  9:46 PM  Result Value Ref Range   Lactic Acid, Venous 0.40 (L) 0.5 - 1.9 mmol/L   Dg Chest 2 View  Result Date: 06/30/2018 CLINICAL DATA:  Cough. Body aches. Abdominal pain. Back pain. History of bladder cancer with last chemotherapy Tuesday. Cough for couple of days. Previous smoker. History of hypertension. EXAM: CHEST - 2 VIEW COMPARISON:  06/13/2018 FINDINGS: Postoperative changes in the mediastinum. Power port type central venous catheter with tip over the cavoatrial junction region. No pneumothorax. Cardiac enlargement. Mild central pulmonary vascular congestion. No edema or consolidation in the lungs. No blunting of costophrenic angles. No pneumothorax. Calcification of the aorta. Mediastinal contours appear intact. IMPRESSION: Cardiac enlargement with mild pulmonary vascular congestion. No pulmonary edema or consolidation. Aortic atherosclerosis. Electronically Signed   By: Lucienne Capers M.D.   On: 06/30/2018 21:38   Ct Renal Stone Study  Result Date: 06/30/2018 CLINICAL DATA:  83 year old male with flank pain, abdominal and back pain. Bladder cancer with most recent chemotherapy last week. EXAM: CT ABDOMEN AND PELVIS WITHOUT CONTRAST TECHNIQUE: Multidetector CT imaging of the abdomen and pelvis was performed following the standard protocol without IV contrast. COMPARISON:  Restaging noncontrast CT chest abdomen and pelvis 03/10/2018 and earlier. FINDINGS: Lower chest: New tree-in-bud nodularity in both lower lobes (series 6, image 22). No lower lobe consolidation or pleural effusion. Small right middle lobe lung nodules are stable on series 6 images 3 and 7. No pericardial effusion. Stable cardiac size at the upper limits of  normal. Hepatobiliary: Negative noncontrast liver and gallbladder. Pancreas: Negative. Spleen: Negative. Adrenals/Urinary Tract: Adrenal glands remain normal. Chronic partial left renal atrophy with chronic left nephrostomy tube. The nephrostomy tube has been exchanged since 03/10/2018, and the nephrostomy loop is now located in the left renal sinus. The left renal pelvis size has not significantly changed. Chronic stranding at the left ureteropelvic junction and along the course of the left ureter is stable. No left hydroureter identified. Chronic right nephroureteral stent is in place and appears appropriately positioned. Trace gas within the nondilated right renal collecting system is noted on series 2, image 29. Stable right renal pelvis size. Stable right ureter. Diminutive, decompressed urinary bladder. Bladder wall thickening, but no discrete bladder mass is evident in the absence of contrast. Stomach/Bowel: Regressed but not resolved rectal wall thickening which may be treatment related. Decompressed rectum and sigmoid colon. Mild sigmoid diverticulosis. Decompressed left colon and splenic flexure. The patient has a chronic transverse colostomy with mucous fistula. Negative transverse and right colon aside from some retained stool. Appendix is diminutive or absent. Negative terminal ileum. No dilated small bowel. Negative stomach and duodenum. No abdominal free air, free fluid. Vascular/Lymphatic: Vascular patency is not evaluated in the absence of IV contrast. Aortoiliac calcified atherosclerosis. No acute lymphadenopathy. Occasional calcified retroperitoneal and mesenteric lymph nodes are stable. Reproductive: Stable and negative (possible small fat containing left inguinal hernia). Other: Regressed but not resolved perirectal and presacral soft tissue thickening and stranding, probably treatment related (series 2, image 74). No pelvic free  fluid. Musculoskeletal: Chronic lumbar spine degeneration and  prior fusion. No acute or suspicious osseous lesion identified. IMPRESSION: 1. New tree-in-bud nodularity in both lower lungs highly suspicious for acute bilateral lower lobe distal airway infection. No pleural effusion. 2. Left nephrostomy tube exchange since March. The left nephrostomy loop now projects more centrally within the renal sinus, but no acute obstruction of the left kidney is evident. Stable positioning of right nephroureteral stent with no adverse features. 3. Regressed but not resolved perirectal and presacral soft tissue thickening and stranding, probably treatment related. 4. No new abnormality identified in the noncontrast abdomen or pelvis. 5. Aortic Atherosclerosis (ICD10-I70.0). Electronically Signed   By: Genevie Ann M.D.   On: 06/30/2018 20:58    Pending Labs Unresulted Labs (From admission, onward)   Start     Ordered   06/30/18 2116  Culture, blood (Routine X 2) w Reflex to ID Panel  BLOOD CULTURE X 2,   R     06/30/18 2116   06/30/18 1949  Urine Culture  STAT,   STAT     06/30/18 1948      Vitals/Pain Today's Vitals   06/30/18 2200 06/30/18 2202 06/30/18 2203 06/30/18 2230  BP: 105/90   106/70  Pulse:  68 77 75  Resp:   14 16  Temp:      TempSrc:      SpO2:  100% 100% 96%  Weight:      Height:      PainSc:        Isolation Precautions No active isolations  Medications Medications  0.9 %  sodium chloride infusion ( Intravenous New Bag/Given 06/30/18 2008)  cefTRIAXone (ROCEPHIN) 1 g in sodium chloride 0.9 % 100 mL IVPB (0 g Intravenous Stopped 06/30/18 2231)  HYDROmorphone (DILAUDID) injection 1 mg (1 mg Intravenous Given 06/30/18 2015)  ondansetron (ZOFRAN) injection 4 mg (4 mg Intravenous Given 06/30/18 2015)    Mobility walks

## 2018-06-30 NOTE — ED Provider Notes (Signed)
[facility] DEPT Provider Note   CSN: [id number] Arrival date & time: 06/30/18  1904     History   Chief Complaint Chief Complaint  Patient presents with  . Abdominal Pain    HPI ZEVEN KOCAK is a 83 y.o. male.  83 year old male presents with whole body pain times several days.  Currently being treated for bladder cancer with chemotherapy last treatment was last week.  Denies any fever or chills.  Also has bilateral nephrostomy tubes and states that he has chronic back pain which is unchanged.  No new cough or congestion.  No emesis or diarrhea today.  Symptoms unresponsive to his home opiates.  Nothing makes his symptoms better.     Past Medical History:  Diagnosis Date  . Aortic atherosclerosis ([place])   . BPH with urinary obstruction   . CAD (coronary artery disease)    a.  MI 1995, CABG x 3 2002 (patient says that he had LIMA and RIMA grafts). b. ETT-Cardiolite (10/15) with EF 48%, apical scar, no ischemia. c. Nuc 07/2017 abnormal -> cath was performed,  patent LIMA to LAD and SVG to OM 2. RIMA to RCA is atretic. The right coronary artery is occluded distally with extensive collaterals from the LAD, medical therapy.   . Cancer [facility])    bladder cancer  . Cervical spondylosis without myelopathy December 30, 202017  . Chronic low back pain    sees Dr. Kary Kos   . GERD (gastroesophageal reflux disease)   . Hiatal hernia   . Hyperlipidemia   . Hypertension   . IBS (irritable bowel syndrome)   . Ischemic cardiomyopathy   . Myocardial infarction ([place])   . RBBB   . Restless legs syndrome   . Statin intolerance   . Syncope    a. in 2015 - no apparent cause, was taking sleep medicine at the time. Cardiac workup unremarkable.  . Tubular adenoma of colon     Patient Active Problem List   Diagnosis Date Noted  . Port-A-Cath in place 03/31/2018  . PVC (premature ventricular contraction) 02/25/2018  . Malignant neoplasm of urinary bladder ([place])  02/25/2018  . Catheter-associated urinary tract infection ([place]) 02/17/2018  . Nephrostomy complication ([place]) 53/29/9242  . Chronic systolic heart failure ([place]) 02/07/2018  . Malnutrition of moderate degree 02/01/2018  . Acute respiratory failure with hypoxia ([place]) 01/31/2018  . Acute CHF (congestive heart failure) ([place]) 01/31/2018  . Thrombocytopathia ([place])   . DVT (deep venous thrombosis) ([place]) 01/11/2018  . Acute lower UTI 01/01/2018  . Sepsis secondary to UTI ([place]) 01/01/2018  . Colostomy in place [facility]) 12/04/2017  . Rectal stenosis   . Low vitamin B12 level 11/30/2017  . Irritable bowel syndrome with constipation   . Pyelonephritis 11/28/2017  . Fever 11/28/2017  . Ileus ([place]) 11/28/2017  . Elevated troponin 11/28/2017  . Urothelial carcinoma of bladder ([place]) 11/28/2017  . Dehydration 11/28/2017  . Thrombocytopenia ([place]) 11/28/2017  . Hydronephrosis due to obstructive malignant neoplasm of bladder ([place]) 11/25/2017  . Anemia 09/20/2017  . Chronic kidney disease, stage 3 ([place]) 09/20/2017  . Angina pectoris ([place])   . Abnormal stress test   . Cervical spondylosis without myelopathy 0December 30, 202017  . PAD (peripheral artery disease) ([place]) 03/04/2015  . Coronary artery disease involving native heart without angina pectoris 11/01/2014  . Acid reflux 11/01/2014  . H/O male genital system disorder 11/01/2014  . Restless leg 11/01/2014  . Acne erythematosa 11/01/2014  . Syncope 09/24/2014  . Chronic low back  pain 11/10/2013  . Cardiomyopathy, ischemic 11/29/2012  . Hyperlipidemia LDL goal <70 10/15/2010  . RESTLESS LEGS SYNDROME 10/15/2010  . Essential hypertension 10/15/2010  . MYOCARDIAL INFARCTION, HX OF 10/15/2010  . Coronary artery disease of native heart with stable angina pectoris ([place]) 10/15/2010  . GERD 10/15/2010  . BENIGN PROSTATIC HYPERTROPHY 10/15/2010  . BENIGN PROSTATIC HYPERTROPHY, WITH OBSTRUCTION 10/15/2010  . COLONIC POLYPS, HX OF 10/15/2010    Past Surgical  History:  Procedure Laterality Date  . COLONOSCOPY  10/17/2014   per Dr. Hilarie Fredrickson, tubular adenomas, repeat in 3 yrs   . COLONOSCOPY WITH PROPOFOL N/A 12/01/2017   Procedure: COLONOSCOPY WITH PROPOFOL;  Surgeon: Milus Banister, MD;  Location: [facility];  Service: Endoscopy;  Laterality: N/A;  . CYSTOSCOPY W/ RETROGRADES Left 11/25/2017   Procedure: CYSTOSCOPY WITH RETROGRADE PYELOGRAM;  Surgeon: Irine Seal, MD;  Location: [facility];  Service: Urology;  Laterality: Left;  . HEART BYPASS    . IR CONVERT RIGHT NEPHROSTOMY TO NEPHROURETERAL CATH  02/04/2018  . IR EXT NEPHROURETERAL CATH EXCHANGE  04/11/2018  . IR FLUORO GUIDE CV LINE RIGHT  12/27/2017  . IR NEPHROSTOGRAM LEFT THRU EXISTING ACCESS  12/24/2017  . IR NEPHROSTOMY EXCHANGE LEFT  01/28/2018  . IR NEPHROSTOMY EXCHANGE LEFT  02/04/2018  . IR NEPHROSTOMY EXCHANGE LEFT  02/25/2018  . IR NEPHROSTOMY EXCHANGE RIGHT  12/24/2017  . IR NEPHROSTOMY EXCHANGE RIGHT  01/28/2018  . IR NEPHROSTOMY EXCHANGE RIGHT  04/11/2018  . IR NEPHROSTOMY PLACEMENT LEFT  11/28/2017  . IR NEPHROSTOMY PLACEMENT RIGHT  12/03/2017  . IR PATIENT EVAL TECH 0-60 MINS  03/24/2018  . IR PATIENT EVAL TECH 0-60 MINS  03/28/2018  . IR PATIENT EVAL TECH 0-60 MINS  04/25/2018  . IR PATIENT EVAL TECH 0-60 MINS  06/20/2018  . IR US GUIDE VASC ACCESS RIGHT  12/27/2017  . LAPAROSCOPY N/A 12/01/2017   Procedure: LAPAROSCOPIC DIVERTING OSTOMY;  Surgeon: Stark Klein, MD;  Location: [facility];  Service: General;  Laterality: N/A;  . LEFT HEART CATH AND CORS/GRAFTS ANGIOGRAPHY N/A 08/25/2017   Procedure: LEFT HEART CATH AND CORS/GRAFTS ANGIOGRAPHY;  Surgeon: Wellington Hampshire, MD;  Location: [facility] CV LAB;  Service: Cardiovascular;  Laterality: N/A;  . LUMBAR FUSION  2003   L3-L4  . PROSTATE SURGERY  06-27-12   per Dr. Roni Bread, had CTT  . TONSILLECTOMY    . TRANSURETHRAL RESECTION OF BLADDER TUMOR N/A 11/25/2017   Procedure: TRANSURETHRAL RESECTION OF BLADDER TUMOR (TURBT);  Surgeon: Irine Seal,  MD;  Location: [facility];  Service: Urology;  Laterality: N/A;        Home Medications    Prior to Admission medications   Medication Sig Start Date End Date Taking? Authorizing Provider  acetaminophen (TYLENOL) 500 MG tablet Take 1,000 mg by mouth every 8 (eight) hours as needed for mild pain or moderate pain.   Yes [provider]  carvedilol (COREG) 6.25 MG tablet Take 1 tablet (6.25 mg total) 2 (two) times daily with a meal by mouth. 11/09/17  Yes Larey Dresser, MD  dicyclomine (BENTYL) 10 MG capsule TAKE 1-2 BY MOUTH EVERY 6 HOURS AS NEEDED FOR RECTAL SPASMS. 03/07/18  Yes Pyrtle, Lajuan Lines, MD  diphenhydrAMINE (BENADRYL) 50 MG tablet Take 100 mg by mouth at bedtime.   Yes [provider]  feeding supplement, ENSURE ENLIVE, (ENSURE ENLIVE) LIQD Take 237 mLs by mouth 2 (two) times daily between meals. 02/03/18  Yes Short, Noah Delaine, MD  furosemide (LASIX) 40 MG tablet  Take 1 tablet (40 mg total) by mouth 2 (two) times daily. Patient taking differently: Take 40 mg by mouth as needed for fluid or edema.  05/13/18  Yes End, Harrell Gave, MD  lidocaine-prilocaine (EMLA) cream Apply 1 application topically as needed. 12/15/17  Yes Shadad, Mathis Dad, MD  LINZESS 290 MCG CAPS capsule TAKE 1 CAP BY MOUTH ONCE DAILY 30 MIN PRIOR TO A MEAL. MUST HAVE OFFICE VISIT FOR FURTHER REFILLS 05/12/18  Yes Pyrtle, Lajuan Lines, MD  megestrol (MEGACE) 40 MG/ML suspension TAKE 10ML BY MOUTH TWICE A DAY 05/13/18  Yes Shadad, Mathis Dad, MD  mirabegron ER (MYRBETRIQ) 25 MG TB24 tablet Take 1 tablet (25 mg total) by mouth daily. 02/04/18  Yes Short, Noah Delaine, MD  mirtazapine (REMERON) 15 MG tablet Take 1 tablet (15 mg total) by mouth at bedtime. 05/30/18  Yes Tanner, Lyndon Code., PA-C  nitroGLYCERIN (NITROSTAT) 0.4 MG SL tablet Place 1 tablet (0.4 mg total) under the tongue every 5 (five) minutes as needed. 08/19/17 06/30/18 Yes Dunn, Dayna N, PA-C  oxyCODONE (OXYCONTIN) 10 mg 12 hr tablet Take 1 tablet (10 mg total) by mouth 2  (two) times daily. 06/02/18  Yes Wyatt Portela, MD  oxyCODONE-acetaminophen (PERCOCET) 10-325 MG tablet Take 1 tablet by mouth every 4 (four) hours as needed for pain. 05/16/18  Yes Wyatt Portela, MD  pramipexole (MIRAPEX) 1 MG tablet TAKE 1 AND 1/2 TABLETS BY MOUTH AT BEDTIME 06/29/18  Yes [name], MD  prochlorperazine (COMPAZINE) 10 MG tablet Take 1 tablet (10 mg total) by mouth every 6 (six) hours as needed for nausea or vomiting. 12/15/17  Yes Wyatt Portela, MD  ranitidine (ZANTAC) 300 MG tablet TAKE 1 TABLET BY MOUTH TWICE A DAY 02/10/18  Yes Pyrtle, Lajuan Lines, MD  Tetrahydroz-Glyc-Hyprom-PEG (VISINE MAXIMUM REDNESS RELIEF OP) Place 2 drops into both eyes 2 (two) times daily as needed (dry eyes).    Yes [provider]  traMADol (ULTRAM) 50 MG tablet Take 50 mg by mouth as needed for moderate pain.   Yes [provider]  guaiFENesin-codeine 100-10 MG/5ML syrup Take 5 mLs by mouth every 6 (six) hours as needed for cough. Patient not taking: Reported on 06/30/2018 06/13/18   Harle Stanford., PA-C  levofloxacin (LEVAQUIN) 500 MG tablet Take 1 tablet (500 mg total) by mouth daily. Patient not taking: Reported on 06/30/2018 06/13/18   Sandi Mealy E., PA-C  traZODone (DESYREL) 50 MG tablet TAKE 1 TABLET BY MOUTH AT BEDTIME AS NEEDED FOR SLEEP. Patient not taking: Reported on 06/30/2018 04/14/18   [name], MD    Family History Family History  Problem Relation Age of Onset  . Heart attack Mother   . Aneurysm Father        femoral artery  . Heart disease Brother   . Heart disease Maternal Uncle        x 2  . Colon cancer Neg Hx   . Esophageal cancer Neg Hx   . Pancreatic cancer Neg Hx   . Kidney disease Neg Hx   . Liver disease Neg Hx     Social History Social History   Tobacco Use  . Smoking status: Former Smoker    Types: Cigarettes    Last attempt to quit: 12/28/1978    Years since quitting: 39.5  . Smokeless tobacco: Never Used  Substance Use Topics  . Alcohol  use: Yes    Alcohol/week: 1.2 oz    Types: 1 Glasses of wine,  1 Cans of beer per week    Comment: occassional  . Drug use: No     Allergies   Antihistamines, loratadine-type and Statins   Review of Systems Review of Systems  All other systems reviewed and are negative.    Physical Exam Updated Vital Signs BP 114/74 (BP Location: Right Arm)   Pulse 89   Temp 98.3 F (36.8 C) (Oral)   Resp 14   Ht 1.727 m (5\' 8" )   Wt 77.1 kg (170 lb)   SpO2 97%   BMI 25.85 kg/m   Physical Exam  Constitutional: He is oriented to person, place, and time. He appears well-developed and well-nourished.  Non-toxic appearance. No distress.  HENT:  Head: Normocephalic and atraumatic.  Eyes: Pupils are equal, round, and reactive to light. Conjunctivae, EOM and lids are normal.  Neck: Normal range of motion. Neck supple. No tracheal deviation present. No thyroid mass present.  Cardiovascular: Normal rate, regular rhythm and normal heart sounds. Exam reveals no gallop.  No murmur heard. Pulmonary/Chest: Effort normal and breath sounds normal. No stridor. No respiratory distress. He has no decreased breath sounds. He has no wheezes. He has no rhonchi. He has no rales.  Abdominal: Soft. Normal appearance and bowel sounds are normal. He exhibits no distension. There is no tenderness. There is no rigidity, no rebound, no guarding and no CVA tenderness.  Musculoskeletal: Normal range of motion. He exhibits no edema or tenderness.  Neurological: He is alert and oriented to person, place, and time. He has normal strength. No cranial nerve deficit or sensory deficit. GCS eye subscore is 4. GCS verbal subscore is 5. GCS motor subscore is 6.  Skin: Skin is warm and dry. No abrasion and no rash noted.  Psychiatric: He has a normal mood and affect. His speech is normal and behavior is normal.  Nursing note and vitals reviewed.    ED Treatments / Results  Labs (all labs ordered are listed, but only abnormal  results are displayed) Labs Reviewed  URINE CULTURE  CBC WITH DIFFERENTIAL/PLATELET  COMPREHENSIVE METABOLIC PANEL  URINALYSIS, ROUTINE W REFLEX MICROSCOPIC    EKG None  Radiology No results found.  Procedures Procedures (including critical care time)  Medications Ordered in ED Medications  0.9 %  sodium chloride infusion (has no administration in time range)  HYDROmorphone (DILAUDID) injection 1 mg (has no administration in time range)  ondansetron (ZOFRAN) injection 4 mg (has no administration in time range)     Initial Impression / Assessment and Plan / ED Course  I have reviewed the triage vital signs and the nursing notes.  Pertinent labs & imaging results that were available during my care of the patient were reviewed by me and considered in my medical decision making (see chart for details).    His urinalysis shows evidence of infection.  He was started on IV Rocephin for this.  Renal CT shows no evidence of obstruction of his nephrostomy tubes.  Lactate is normal.  Chest x-ray without evidence of acute infiltrate.  Patient receiving active chemotherapy at this time and will admit to the hospitalist service  Final Clinical Impressions(s) / ED Diagnoses   Final diagnoses:  None    ED Discharge Orders    None       Lacretia Leigh, MD 06/30/18 2201

## 2018-07-01 ENCOUNTER — Other Ambulatory Visit: Payer: Self-pay

## 2018-07-01 DIAGNOSIS — D696 Thrombocytopenia, unspecified: Secondary | ICD-10-CM | POA: Diagnosis not present

## 2018-07-01 DIAGNOSIS — N183 Chronic kidney disease, stage 3 (moderate): Secondary | ICD-10-CM | POA: Diagnosis not present

## 2018-07-01 DIAGNOSIS — R05 Cough: Secondary | ICD-10-CM

## 2018-07-01 DIAGNOSIS — T83098A Other mechanical complication of other indwelling urethral catheter, initial encounter: Secondary | ICD-10-CM | POA: Diagnosis not present

## 2018-07-01 DIAGNOSIS — N390 Urinary tract infection, site not specified: Secondary | ICD-10-CM

## 2018-07-01 LAB — COMPREHENSIVE METABOLIC PANEL
ALBUMIN: 3 g/dL — AB (ref 3.5–5.0)
ALK PHOS: 42 U/L (ref 38–126)
ALT: 13 U/L (ref 0–44)
ANION GAP: 7 (ref 5–15)
AST: 16 U/L (ref 15–41)
BUN: 31 mg/dL — AB (ref 8–23)
CALCIUM: 9.1 mg/dL (ref 8.9–10.3)
CO2: 18 mmol/L — AB (ref 22–32)
Chloride: 109 mmol/L (ref 98–111)
Creatinine, Ser: 1.61 mg/dL — ABNORMAL HIGH (ref 0.61–1.24)
GFR calc Af Amer: 45 mL/min — ABNORMAL LOW (ref >60)
GFR calc non Af Amer: 38 mL/min — ABNORMAL LOW (ref >60)
GLUCOSE: 94 mg/dL (ref 70–99)
POTASSIUM: 4.1 mmol/L (ref 3.5–5.1)
SODIUM: 134 mmol/L — AB (ref 135–145)
Total Bilirubin: 0.9 mg/dL (ref 0.3–1.2)
Total Protein: 6 g/dL — ABNORMAL LOW (ref 6.5–8.1)

## 2018-07-01 LAB — CBC
HEMATOCRIT: 21.1 % — AB (ref 39.0–52.0)
HEMOGLOBIN: 7.1 g/dL — AB (ref 13.0–17.0)
MCH: 32.4 pg (ref 26.0–34.0)
MCHC: 33.6 g/dL (ref 30.0–36.0)
MCV: 96.3 fL (ref 78.0–100.0)
Platelets: 55 10*3/uL — ABNORMAL LOW (ref 150–400)
RBC: 2.19 MIL/uL — ABNORMAL LOW (ref 4.22–5.81)
RDW: 15.9 % — ABNORMAL HIGH (ref 11.5–15.5)
WBC: 5.9 10*3/uL (ref 4.0–10.5)

## 2018-07-01 LAB — MRSA PCR SCREENING: MRSA by PCR: POSITIVE — AB

## 2018-07-01 LAB — STREP PNEUMONIAE URINARY ANTIGEN: Strep Pneumo Urinary Antigen: NEGATIVE

## 2018-07-01 MED ORDER — LACTULOSE 10 GM/15ML PO SOLN
10.0000 g | Freq: Once | ORAL | Status: AC
  Administered 2018-07-01: 10 g via ORAL
  Filled 2018-07-01: qty 30

## 2018-07-01 MED ORDER — CHLORHEXIDINE GLUCONATE CLOTH 2 % EX PADS
6.0000 | MEDICATED_PAD | Freq: Every day | CUTANEOUS | Status: DC
  Administered 2018-07-02 – 2018-07-04 (×3): 6 via TOPICAL

## 2018-07-01 MED ORDER — OXYCODONE-ACETAMINOPHEN 5-325 MG PO TABS
1.0000 | ORAL_TABLET | ORAL | Status: DC | PRN
  Administered 2018-07-01 – 2018-07-04 (×7): 1 via ORAL
  Filled 2018-07-01 (×7): qty 1

## 2018-07-01 MED ORDER — MUPIROCIN 2 % EX OINT
1.0000 "application " | TOPICAL_OINTMENT | Freq: Two times a day (BID) | CUTANEOUS | Status: DC
  Administered 2018-07-01 – 2018-07-04 (×6): 1 via NASAL
  Filled 2018-07-01: qty 22

## 2018-07-01 MED ORDER — OXYCODONE HCL 5 MG PO TABS
5.0000 mg | ORAL_TABLET | ORAL | Status: DC | PRN
  Administered 2018-07-01 – 2018-07-04 (×6): 5 mg via ORAL
  Filled 2018-07-01 (×6): qty 1

## 2018-07-01 MED ORDER — TRIAMCINOLONE ACETONIDE 0.1 % EX CREA
TOPICAL_CREAM | Freq: Three times a day (TID) | CUTANEOUS | Status: DC
  Administered 2018-07-01 – 2018-07-04 (×6): via TOPICAL
  Filled 2018-07-01: qty 15

## 2018-07-01 MED ORDER — SODIUM CHLORIDE 0.9 % IV SOLN
1250.0000 mg | INTRAVENOUS | Status: DC
  Filled 2018-07-01: qty 1250

## 2018-07-01 NOTE — Progress Notes (Signed)
PROGRESS NOTE    Charles Coffey  SWF:[id number] DOB: 07/21/1935 DOA: 06/30/2018 PCP: [name], MD    Brief Narrative:  83 y.o. male, w hypertension, hyperlipidemia, CAD s/p MI, RLS, stage 4, bladder cancer s/p resection (11/25/2017), chemo, and bilateral nephrostomy tube 2018, diverting colostomy 12/01/2017 secondary to colonic obstruction apparently presents with c/o bilateral flank pain today and therefore presented to Ed. Pt has slight dry cough.  Denies fever, chills, cp, palp, sob, n/v, blood in colostomy.  Pt presented to ED for evaluation of bilateral flank pain. CT abd with new L nephrostomy exchange and stable R nephroureteral stent. Patient was placed on observation for possible UTI  Assessment & Plan:   Principal Problem:   UTI (urinary tract infection) Active Problems:   Anemia   Chronic kidney disease, stage 3 (HCC)   Thrombocytopenia (HCC)  Possible UTI -UA appears consistent with UTI, however this is in setting of known urologic hardware thus blood and leukocytes would already be expected -Blood and urine cultures are pending -Patient continued on empiric vancomycin and cefepime per below -Low threshold for focusing on PNA if urine cx unremarkable  Healthcare associated Pneumonia present on admission -CXR personally reviewed. Findings of B infiltrates suspicious for PNA -Urine strep antigen negative -Continue with Vanco iv, cefepime iv pharmacy to dose for Hcap  Anemia/ Thrombocytopenia -Repeat CBC in AM -Continue to avoid heparin products  CKD stage3 -Recheck CBC in AM  RLS -mirapex 1.5mg  po qhs as tolerated  Gerd -Cont H2 blocker as tolerated  Constipation -No significant bowel movements per wife in room -Patient had been on continued [place] abd personally reviewed. Stool noted primarily throughout ascending colon -Good results thus far with addition of lactulose  CAD -Cont carvedilol as tolerated -Cont lasix for now  DVT  prophylaxis: SCd's Code Status: Full Family Communication: Pt in room, family at bedside Disposition Plan: Uncertain at this time  Consultants:     Procedures:     Antimicrobials: Anti-infectives (From admission, onward)   Start     Dose/Rate Route Frequency Ordered Stop   07/02/18 1800  vancomycin (VANCOCIN) 1,250 mg in sodium chloride 0.9 % 250 mL IVPB     1,250 mg 166.7 mL/hr over 90 Minutes Intravenous Every 48 hours 07/01/18 0511     06/30/18 2345  vancomycin (VANCOCIN) 1,250 mg in sodium chloride 0.9 % 250 mL IVPB     1,250 mg 166.7 mL/hr over 90 Minutes Intravenous  Once 06/30/18 2330 07/01/18 0135   06/30/18 2345  ceFEPIme (MAXIPIME) 1 g in sodium chloride 0.9 % 100 mL IVPB     1 g 200 mL/hr over 30 Minutes Intravenous Every 12 hours 06/30/18 2330     06/30/18 2130  cefTRIAXone (ROCEPHIN) 1 g in sodium chloride 0.9 % 100 mL IVPB  Status:  Discontinued     1 g 200 mL/hr over 30 Minutes Intravenous Every 24 hours 06/30/18 2116 06/30/18 2329   06/30/18 2130  sodium chloride 0.9 % with cefTRIAXone (ROCEPHIN) ADS Med    Note to Pharmacy:  Talbert Cage   : cabinet override      06/30/18 2130 07/01/18 0929       Subjective: Drowsy this AM after receiving sedating medications  Objective: Vitals:   06/30/18 2230 06/30/18 2327 07/01/18 0509 07/01/18 1337  BP: 106/70 109/68 (!) 98/53 105/64  Pulse: 75 76 73 66  Resp: 16 20 18 18   Temp:  98.9 F (37.2 C) 98.7 F (37.1 C) 97.9 F (36.6  C)  TempSrc:  Oral Oral Oral  SpO2: 96% 100% 99% 99%  Weight:  76.3 kg (168 lb 3.4 oz) 80.2 kg (176 lb 12.9 oz)   Height:  5\' 8"  (1.727 m)      Intake/Output Summary (Last 24 hours) at 07/01/2018 1437 Last data filed at 07/01/2018 0820 Gross per 24 hour  Intake 1193.75 ml  Output 400 ml  Net 793.75 ml   Filed Weights   06/30/18 1919 06/30/18 2327 07/01/18 0509  Weight: 77.1 kg (170 lb) 76.3 kg (168 lb 3.4 oz) 80.2 kg (176 lb 12.9 oz)    Examination:  General exam: Appears  calm and comfortable  Respiratory system: Clear to auscultation. Respiratory effort normal. Cardiovascular system: S1 & S2 heard, RRR Gastrointestinal system: Abdomen mildly distended, ostomy bag in place, decreased BS Central nervous system: Drowsy but conversant. No focal neurological deficits. Extremities: Symmetric 5 x 5 power. Skin: No rashes, lesions Psychiatry: Unable to obtain as patient remains drowsy from sedating medications  Data Reviewed: I have personally reviewed following labs and imaging studies  CBC: Recent Labs  Lab 06/30/18 2009 07/01/18 0448  WBC 5.8 5.9  NEUTROABS 3.1  --   HGB 8.8* 7.1*  HCT 26.6* 21.1*  MCV 96.7 96.3  PLT 53* 55*   Basic Metabolic Panel: Recent Labs  Lab 06/30/18 2009 07/01/18 0448  NA 138 134*  K 4.0 4.1  CL 111 109  CO2 19* 18*  GLUCOSE 114* 94  BUN 33* 31*  CREATININE 1.73* 1.61*  CALCIUM 9.5 9.1   GFR: Estimated Creatinine Clearance: 34.8 mL/min (A) (by C-G formula based on SCr of 1.61 mg/dL (H)). Liver Function Tests: Recent Labs  Lab 06/30/18 2009 07/01/18 0448  AST 15 16  ALT 15 13  ALKPHOS 48 42  BILITOT 0.4 0.9  PROT 6.6 6.0*  ALBUMIN 3.3* 3.0*   No results for input(s): LIPASE, AMYLASE in the last 168 hours. No results for input(s): AMMONIA in the last 168 hours. Coagulation Profile: No results for input(s): INR, PROTIME in the last 168 hours. Cardiac Enzymes: No results for input(s): CKTOTAL, CKMB, CKMBINDEX, TROPONINI in the last 168 hours. BNP (last 3 results) No results for input(s): PROBNP in the last 8760 hours. HbA1C: No results for input(s): HGBA1C in the last 72 hours. CBG: No results for input(s): GLUCAP in the last 168 hours. Lipid Profile: No results for input(s): CHOL, HDL, LDLCALC, TRIG, CHOLHDL, LDLDIRECT in the last 72 hours. Thyroid Function Tests: No results for input(s): TSH, T4TOTAL, FREET4, T3FREE, THYROIDAB in the last 72 hours. Anemia Panel: No results for input(s):  VITAMINB12, FOLATE, FERRITIN, TIBC, IRON, RETICCTPCT in the last 72 hours. Sepsis Labs: Recent Labs  Lab 06/30/18 2146  LATICACIDVEN 0.40*    Recent Results (from the past 240 hour(s))  Culture, blood (Routine X 2) w Reflex to ID Panel     Status: None (Preliminary result)   Collection Time: 06/30/18  9:50 PM  Result Value Ref Range Status   Specimen Description BLOOD PORTA CATH  Final   Special Requests   Final    AEROBIC BOTTLE ONLY BCAV Performed at [facility], [place] [address], [place], [place] [address]    Culture PENDING  Incomplete   Report Status PENDING  Incomplete  Culture, blood (Routine X 2) w Reflex to ID Panel     Status: None (Preliminary result)   Collection Time: 06/30/18  9:50 PM  Result Value Ref Range Status   Specimen Description BLOOD LEFT  FOREARM  Final   Special Requests   Final    AEROBIC BOTTLE ONLY BCAV Performed at [facility] [address], [place], [place] [address]    Culture PENDING  Incomplete   Report Status PENDING  Incomplete  MRSA PCR Screening     Status: Abnormal   Collection Time: 07/01/18  9:54 AM  Result Value Ref Range Status   MRSA by PCR POSITIVE (A) NEGATIVE Final    Comment:        The GeneXpert MRSA Assay (FDA approved for NASAL specimens only), is one component of a comprehensive MRSA colonization surveillance program. It is not intended to diagnose MRSA infection nor to guide or monitor treatment for MRSA infections. RESULT CALLED TO, READ BACK BY AND VERIFIED WITH: Jethro Bastos [id number] @ [phone] Performed at [facility] [address], [place], [place] [address]      Radiology Studies: Dg Chest 2 View  Result Date: 06/30/2018 CLINICAL DATA:  Cough. Body aches. Abdominal pain. Back pain. History of bladder cancer with last chemotherapy Tuesday. Cough for couple of days. Previous smoker. History of hypertension. EXAM: CHEST - 2 VIEW COMPARISON:   06/13/2018 FINDINGS: Postoperative changes in the mediastinum. Power port type central venous catheter with tip over the cavoatrial junction region. No pneumothorax. Cardiac enlargement. Mild central pulmonary vascular congestion. No edema or consolidation in the lungs. No blunting of costophrenic angles. No pneumothorax. Calcification of the aorta. Mediastinal contours appear intact. IMPRESSION: Cardiac enlargement with mild pulmonary vascular congestion. No pulmonary edema or consolidation. Aortic atherosclerosis. Electronically Signed   By: Lucienne Capers M.D.   On: 06/30/2018 21:38   Ct Renal Stone Study  Result Date: 06/30/2018 CLINICAL DATA:  83 year old male with flank pain, abdominal and back pain. Bladder cancer with most recent chemotherapy last week. EXAM: CT ABDOMEN AND PELVIS WITHOUT CONTRAST TECHNIQUE: Multidetector CT imaging of the abdomen and pelvis was performed following the standard protocol without IV contrast. COMPARISON:  Restaging noncontrast CT chest abdomen and pelvis 03/10/2018 and earlier. FINDINGS: Lower chest: New tree-in-bud nodularity in both lower lobes (series 6, image 22). No lower lobe consolidation or pleural effusion. Small right middle lobe lung nodules are stable on series 6 images 3 and 7. No pericardial effusion. Stable cardiac size at the upper limits of normal. Hepatobiliary: Negative noncontrast liver and gallbladder. Pancreas: Negative. Spleen: Negative. Adrenals/Urinary Tract: Adrenal glands remain normal. Chronic partial left renal atrophy with chronic left nephrostomy tube. The nephrostomy tube has been exchanged since 03/10/2018, and the nephrostomy loop is now located in the left renal sinus. The left renal pelvis size has not significantly changed. Chronic stranding at the left ureteropelvic junction and along the course of the left ureter is stable. No left hydroureter identified. Chronic right nephroureteral stent is in place and appears appropriately  positioned. Trace gas within the nondilated right renal collecting system is noted on series 2, image 29. Stable right renal pelvis size. Stable right ureter. Diminutive, decompressed urinary bladder. Bladder wall thickening, but no discrete bladder mass is evident in the absence of contrast. Stomach/Bowel: Regressed but not resolved rectal wall thickening which may be treatment related. Decompressed rectum and sigmoid colon. Mild sigmoid diverticulosis. Decompressed left colon and splenic flexure. The patient has a chronic transverse colostomy with mucous fistula. Negative transverse and right colon aside from some retained stool. Appendix is diminutive or absent. Negative terminal ileum. No dilated small bowel. Negative stomach and duodenum. No abdominal free air, free  fluid. Vascular/Lymphatic: Vascular patency is not evaluated in the absence of IV contrast. Aortoiliac calcified atherosclerosis. No acute lymphadenopathy. Occasional calcified retroperitoneal and mesenteric lymph nodes are stable. Reproductive: Stable and negative (possible small fat containing left inguinal hernia). Other: Regressed but not resolved perirectal and presacral soft tissue thickening and stranding, probably treatment related (series 2, image 74). No pelvic free fluid. Musculoskeletal: Chronic lumbar spine degeneration and prior fusion. No acute or suspicious osseous lesion identified. IMPRESSION: 1. New tree-in-bud nodularity in both lower lungs highly suspicious for acute bilateral lower lobe distal airway infection. No pleural effusion. 2. Left nephrostomy tube exchange since March. The left nephrostomy loop now projects more centrally within the renal sinus, but no acute obstruction of the left kidney is evident. Stable positioning of right nephroureteral stent with no adverse features. 3. Regressed but not resolved perirectal and presacral soft tissue thickening and stranding, probably treatment related. 4. No new abnormality  identified in the noncontrast abdomen or pelvis. 5. Aortic Atherosclerosis (ICD10-I70.0). Electronically Signed   By: Genevie Ann M.D.   On: 06/30/2018 20:58    Scheduled Meds: . diphenhydrAMINE  25 mg Oral QHS  . famotidine  20 mg Oral Daily  . feeding supplement (ENSURE ENLIVE)  237 mL Oral BID BM  . linaclotide  290 mcg Oral QAC breakfast  . mirabegron ER  25 mg Oral Daily  . mirtazapine  15 mg Oral QHS  . oxyCODONE  10 mg Oral BID  . pramipexole  1.5 mg Oral QHS   Continuous Infusions: . sodium chloride 125 mL/hr at 07/01/18 1010  . ceFEPime (MAXIPIME) IV Stopped (07/01/18 0900)  . [START ON 07/02/2018] vancomycin       LOS: 0 days   Marylu Lund, MD [facility] Pager [phone]  If 7PM-7AM, please contact night-coverage [email or web address] Password [facility] 07/01/2018, 2:37 PM

## 2018-07-01 NOTE — Progress Notes (Signed)
Pharmacy Antibiotic Note  Charles Coffey is a 83 y.o. male admitted on 06/30/2018 with pneumonia and UTI.  Pharmacy has been consulted for Vancomycin, cefepime dosing.  Plan: Cefepime 1gm iv q12hr Vancomycin 1250mg  iv q48hr  Goal AUC = 400 - 500 for all indications, except meningitis (goal AUC > 500 and Cmin 15-20 mcg/mL)   Height: 5\' 8"  (172.7 cm) Weight: 168 lb 3.4 oz (76.3 kg) IBW/kg (Calculated) : 68.4  Temp (24hrs), Avg:98.6 F (37 C), Min:98.3 F (36.8 C), Max:98.9 F (37.2 C)  Recent Labs  Lab 06/30/18 2009 06/30/18 2146  WBC 5.8  --   CREATININE 1.73*  --   LATICACIDVEN  --  0.40*    Estimated Creatinine Clearance: 32.4 mL/min (A) (by C-G formula based on SCr of 1.73 mg/dL (H)).    Allergies  Allergen Reactions  . Antihistamines, Loratadine-Type Other (See Comments)    Unable to urinate  . Statins Other (See Comments)    liver effects    Antimicrobials this admission:  Vancomycin 06/30/2018 >> Cefepime 06/30/2018 >>  Ceftriaxone 06/30/2018 x1  Dose adjustments this admission:  -  Microbiology results:  -     Thank you for allowing pharmacy to be a part of this patient's care.  Nani Skillern Crowford 07/01/2018 5:12 AM

## 2018-07-02 ENCOUNTER — Inpatient Hospital Stay (HOSPITAL_COMMUNITY): Payer: Medicare HMO

## 2018-07-02 DIAGNOSIS — F410 Panic disorder [episodic paroxysmal anxiety] without agoraphobia: Secondary | ICD-10-CM | POA: Diagnosis present

## 2018-07-02 DIAGNOSIS — K589 Irritable bowel syndrome without diarrhea: Secondary | ICD-10-CM | POA: Diagnosis present

## 2018-07-02 DIAGNOSIS — T83512A Infection and inflammatory reaction due to nephrostomy catheter, initial encounter: Secondary | ICD-10-CM | POA: Diagnosis not present

## 2018-07-02 DIAGNOSIS — J189 Pneumonia, unspecified organism: Secondary | ICD-10-CM | POA: Diagnosis not present

## 2018-07-02 DIAGNOSIS — D696 Thrombocytopenia, unspecified: Secondary | ICD-10-CM | POA: Diagnosis not present

## 2018-07-02 DIAGNOSIS — T83098A Other mechanical complication of other indwelling urethral catheter, initial encounter: Secondary | ICD-10-CM | POA: Diagnosis not present

## 2018-07-02 DIAGNOSIS — I700 Atherosclerosis of aorta: Secondary | ICD-10-CM | POA: Diagnosis present

## 2018-07-02 DIAGNOSIS — I129 Hypertensive chronic kidney disease with stage 1 through stage 4 chronic kidney disease, or unspecified chronic kidney disease: Secondary | ICD-10-CM | POA: Diagnosis not present

## 2018-07-02 DIAGNOSIS — I255 Ischemic cardiomyopathy: Secondary | ICD-10-CM | POA: Diagnosis present

## 2018-07-02 DIAGNOSIS — R05 Cough: Secondary | ICD-10-CM | POA: Diagnosis not present

## 2018-07-02 DIAGNOSIS — I252 Old myocardial infarction: Secondary | ICD-10-CM | POA: Diagnosis not present

## 2018-07-02 DIAGNOSIS — E785 Hyperlipidemia, unspecified: Secondary | ICD-10-CM | POA: Diagnosis not present

## 2018-07-02 DIAGNOSIS — N183 Chronic kidney disease, stage 3 (moderate): Secondary | ICD-10-CM | POA: Diagnosis not present

## 2018-07-02 DIAGNOSIS — I2510 Atherosclerotic heart disease of native coronary artery without angina pectoris: Secondary | ICD-10-CM | POA: Diagnosis not present

## 2018-07-02 DIAGNOSIS — G2581 Restless legs syndrome: Secondary | ICD-10-CM | POA: Diagnosis not present

## 2018-07-02 DIAGNOSIS — K219 Gastro-esophageal reflux disease without esophagitis: Secondary | ICD-10-CM | POA: Diagnosis present

## 2018-07-02 DIAGNOSIS — Y95 Nosocomial condition: Secondary | ICD-10-CM | POA: Diagnosis present

## 2018-07-02 DIAGNOSIS — Y846 Urinary catheterization as the cause of abnormal reaction of the patient, or of later complication, without mention of misadventure at the time of the procedure: Secondary | ICD-10-CM | POA: Diagnosis present

## 2018-07-02 DIAGNOSIS — Z1624 Resistance to multiple antibiotics: Secondary | ICD-10-CM | POA: Diagnosis present

## 2018-07-02 DIAGNOSIS — R0989 Other specified symptoms and signs involving the circulatory and respiratory systems: Secondary | ICD-10-CM | POA: Diagnosis not present

## 2018-07-02 DIAGNOSIS — Z8551 Personal history of malignant neoplasm of bladder: Secondary | ICD-10-CM | POA: Diagnosis not present

## 2018-07-02 DIAGNOSIS — N138 Other obstructive and reflux uropathy: Secondary | ICD-10-CM | POA: Diagnosis not present

## 2018-07-02 DIAGNOSIS — N390 Urinary tract infection, site not specified: Secondary | ICD-10-CM | POA: Diagnosis not present

## 2018-07-02 DIAGNOSIS — Z9221 Personal history of antineoplastic chemotherapy: Secondary | ICD-10-CM | POA: Diagnosis not present

## 2018-07-02 DIAGNOSIS — Z933 Colostomy status: Secondary | ICD-10-CM | POA: Diagnosis not present

## 2018-07-02 DIAGNOSIS — N401 Enlarged prostate with lower urinary tract symptoms: Secondary | ICD-10-CM | POA: Diagnosis present

## 2018-07-02 DIAGNOSIS — B964 Proteus (mirabilis) (morganii) as the cause of diseases classified elsewhere: Secondary | ICD-10-CM | POA: Diagnosis present

## 2018-07-02 DIAGNOSIS — D631 Anemia in chronic kidney disease: Secondary | ICD-10-CM | POA: Diagnosis present

## 2018-07-02 DIAGNOSIS — Z951 Presence of aortocoronary bypass graft: Secondary | ICD-10-CM | POA: Diagnosis not present

## 2018-07-02 LAB — CBC
HEMATOCRIT: 22.2 % — AB (ref 39.0–52.0)
Hemoglobin: 7.4 g/dL — ABNORMAL LOW (ref 13.0–17.0)
MCH: 32.3 pg (ref 26.0–34.0)
MCHC: 33.3 g/dL (ref 30.0–36.0)
MCV: 96.9 fL (ref 78.0–100.0)
PLATELETS: 56 10*3/uL — AB (ref 150–400)
RBC: 2.29 MIL/uL — ABNORMAL LOW (ref 4.22–5.81)
RDW: 15.8 % — AB (ref 11.5–15.5)
WBC: 5.3 10*3/uL (ref 4.0–10.5)

## 2018-07-02 LAB — BASIC METABOLIC PANEL
Anion gap: 8 (ref 5–15)
BUN: 24 mg/dL — AB (ref 8–23)
CO2: 18 mmol/L — ABNORMAL LOW (ref 22–32)
Calcium: 9.2 mg/dL (ref 8.9–10.3)
Chloride: 113 mmol/L — ABNORMAL HIGH (ref 98–111)
Creatinine, Ser: 1.5 mg/dL — ABNORMAL HIGH (ref 0.61–1.24)
GFR calc Af Amer: 49 mL/min — ABNORMAL LOW (ref >60)
GFR, EST NON AFRICAN AMERICAN: 42 mL/min — AB (ref >60)
Glucose, Bld: 99 mg/dL (ref 70–99)
POTASSIUM: 3.8 mmol/L (ref 3.5–5.1)
SODIUM: 139 mmol/L (ref 135–145)

## 2018-07-02 MED ORDER — HYDROXYZINE HCL 10 MG PO TABS
10.0000 mg | ORAL_TABLET | Freq: Three times a day (TID) | ORAL | Status: DC | PRN
Start: 2018-07-02 — End: 2018-07-04
  Administered 2018-07-02: 10 mg via ORAL
  Filled 2018-07-02: qty 1

## 2018-07-02 MED ORDER — FUROSEMIDE 10 MG/ML IJ SOLN
40.0000 mg | Freq: Once | INTRAMUSCULAR | Status: AC
  Administered 2018-07-02: 40 mg via INTRAVENOUS
  Filled 2018-07-02: qty 4

## 2018-07-02 MED ORDER — SODIUM CHLORIDE 0.9 % IV SOLN
1750.0000 mg | INTRAVENOUS | Status: DC
  Administered 2018-07-02: 1750 mg via INTRAVENOUS
  Filled 2018-07-02: qty 1750

## 2018-07-02 NOTE — Progress Notes (Signed)
PROGRESS NOTE    Charles Coffey  UMP:[id number] DOB: 01/27/1935 DOA: 06/30/2018 PCP: [name], MD    Brief Narrative:  83 y.o. male, w hypertension, hyperlipidemia, CAD s/p MI, RLS, stage 4, bladder cancer s/p resection (11/25/2017), chemo, and bilateral nephrostomy tube 2018, diverting colostomy 12/01/2017 secondary to colonic obstruction apparently presents with c/o bilateral flank pain today and therefore presented to Ed. Pt has slight dry cough.  Denies fever, chills, cp, palp, sob, n/v, blood in colostomy.  Pt presented to ED for evaluation of bilateral flank pain. CT abd with new L nephrostomy exchange and stable R nephroureteral stent. Patient was placed on observation for possible UTI  Assessment & Plan:   Principal Problem:   UTI (urinary tract infection) Active Problems:   Anemia   Chronic kidney disease, stage 3 (HCC)   Thrombocytopenia (HCC)  Proteus UTI -UA appears consistent with UTI, however this is in setting of known urologic hardware thus blood and leukocytes would already be expected -Blood and urine cultures are pending -Pt is continued on empiric vancomycin and cefepime per below -L nephrostomy site appears erythematous, indurated with purulence  -Have consulted IR. Discussed case with IR. Plan for Nephrostomy tube change on 7/8  Healthcare associated Pneumonia present on admission -CXR personally reviewed. Findings of B infiltrates consistent with PNA -Urine strep antigen negative -Patient is continued on Vanco iv, cefepime iv pharmacy to dose for Hcap -Repeat CBC in AM  Anemia/ Thrombocytopenia -Recheck CBC in AM -Continue to avoid heparin products  CKD stage3 -Recheck bmet in AM  RLS -continue with mirapex 1.5mg  po qhs as tolerated  Gerd -Cont H2 blocker as patient tolerates  Constipation -Patient had been on continued Linzess -CT abd personally reviewed. Stool noted primarily throughout ascending colon -Very good results thus far  with addition of lactulose with resolution of R sided abd pain  CAD -Cont carvedilol as tolerated -Cont lasix for now -No chest pains  Anxiety/Panic attack -patient noted to have panic attack this AM -will give trial of vistaril PRN  DVT prophylaxis: SCd's Code Status: Full Family Communication: Pt in room, family at bedside Disposition Plan: Uncertain at this time  Consultants:   IR  Procedures:     Antimicrobials: Anti-infectives (From admission, onward)   Start     Dose/Rate Route Frequency Ordered Stop   07/02/18 1800  vancomycin (VANCOCIN) 1,250 mg in sodium chloride 0.9 % 250 mL IVPB  Status:  Discontinued     1,250 mg 166.7 mL/hr over 90 Minutes Intravenous Every 48 hours 07/01/18 0511 07/02/18 1132   07/02/18 1400  vancomycin (VANCOCIN) 1,750 mg in sodium chloride 0.9 % 500 mL IVPB     1,750 mg 250 mL/hr over 120 Minutes Intravenous Every 48 hours 07/02/18 1132     06/30/18 2345  vancomycin (VANCOCIN) 1,250 mg in sodium chloride 0.9 % 250 mL IVPB     1,250 mg 166.7 mL/hr over 90 Minutes Intravenous  Once 06/30/18 2330 07/01/18 0135   06/30/18 2345  ceFEPIme (MAXIPIME) 1 g in sodium chloride 0.9 % 100 mL IVPB     1 g 200 mL/hr over 30 Minutes Intravenous Every 12 hours 06/30/18 2330     06/30/18 2130  cefTRIAXone (ROCEPHIN) 1 g in sodium chloride 0.9 % 100 mL IVPB  Status:  Discontinued     1 g 200 mL/hr over 30 Minutes Intravenous Every 24 hours 06/30/18 2116 06/30/18 2329   06/30/18 2130  sodium chloride 0.9 % with cefTRIAXone (ROCEPHIN) ADS Med  Note to Pharmacy:  Talbert Cage   : cabinet override      06/30/18 2130 07/01/18 0929      Subjective: Complaining of continued lower quadrant pain  Objective: Vitals:   07/01/18 2141 07/02/18 0407 07/02/18 0543 07/02/18 1359  BP: (!) 118/55 131/76  117/63  Pulse: 85 89  81  Resp: 18 18  20   Temp: 99.3 F (37.4 C) 98.8 F (37.1 C)  98 F (36.7 C)  TempSrc: Oral   Oral  SpO2: 100% 97%  97%  Weight:    79.6 kg (175 lb 8 oz)   Height:        Intake/Output Summary (Last 24 hours) at 07/02/2018 1439 Last data filed at 07/02/2018 0200 Gross per 24 hour  Intake 1597.5 ml  Output 450 ml  Net 1147.5 ml   Filed Weights   06/30/18 2327 07/01/18 0509 07/02/18 0543  Weight: 76.3 kg (168 lb 3.4 oz) 80.2 kg (176 lb 12.9 oz) 79.6 kg (175 lb 8 oz)    Examination: General exam: Conversant, in no acute distress Respiratory system: normal chest rise, clear, no audible wheezing Cardiovascular system: regular rhythm, s1-s2 Gastrointestinal system: Nondistended, nontender, pos BS Central nervous system: No seizures, no tremors Extremities: No cyanosis, no joint deformities Skin: No rashes, L nephrostomy site indurated, erythematous, purulence draining Psychiatry: Affect normal // no auditory hallucinations   Data Reviewed: I have personally reviewed following labs and imaging studies  CBC: Recent Labs  Lab 06/30/18 2009 07/01/18 0448 07/02/18 0424  WBC 5.8 5.9 5.3  NEUTROABS 3.1  --   --   HGB 8.8* 7.1* 7.4*  HCT 26.6* 21.1* 22.2*  MCV 96.7 96.3 96.9  PLT 53* 55* 56*   Basic Metabolic Panel: Recent Labs  Lab 06/30/18 2009 07/01/18 0448 07/02/18 0424  NA 138 134* 139  K 4.0 4.1 3.8  CL 111 109 113*  CO2 19* 18* 18*  GLUCOSE 114* 94 99  BUN 33* 31* 24*  CREATININE 1.73* 1.61* 1.50*  CALCIUM 9.5 9.1 9.2   GFR: Estimated Creatinine Clearance: 37.4 mL/min (A) (by C-G formula based on SCr of 1.5 mg/dL (H)). Liver Function Tests: Recent Labs  Lab 06/30/18 2009 07/01/18 0448  AST 15 16  ALT 15 13  ALKPHOS 48 42  BILITOT 0.4 0.9  PROT 6.6 6.0*  ALBUMIN 3.3* 3.0*   No results for input(s): LIPASE, AMYLASE in the last 168 hours. No results for input(s): AMMONIA in the last 168 hours. Coagulation Profile: No results for input(s): INR, PROTIME in the last 168 hours. Cardiac Enzymes: No results for input(s): CKTOTAL, CKMB, CKMBINDEX, TROPONINI in the last 168 hours. BNP (last  3 results) No results for input(s): PROBNP in the last 8760 hours. HbA1C: No results for input(s): HGBA1C in the last 72 hours. CBG: No results for input(s): GLUCAP in the last 168 hours. Lipid Profile: No results for input(s): CHOL, HDL, LDLCALC, TRIG, CHOLHDL, LDLDIRECT in the last 72 hours. Thyroid Function Tests: No results for input(s): TSH, T4TOTAL, FREET4, T3FREE, THYROIDAB in the last 72 hours. Anemia Panel: No results for input(s): VITAMINB12, FOLATE, FERRITIN, TIBC, IRON, RETICCTPCT in the last 72 hours. Sepsis Labs: Recent Labs  Lab 06/30/18 2146  LATICACIDVEN 0.40*    Recent Results (from the past 240 hour(s))  Urine Culture     Status: Abnormal (Preliminary result)   Collection Time: 06/30/18  7:49 PM  Result Value Ref Range Status   Specimen Description   Final    URINE, CLEAN  CATCH Performed at [facility], [place] [address] [place], [place] [address]    Special Requests   Final    NONE Performed at [facility], [place] [address], [place], [place] [address]    Culture >=100,000 COLONIES/mL PROTEUS MIRABILIS (A)  Final   Report Status PENDING  Incomplete  Culture, blood (Routine X 2) w Reflex to ID Panel     Status: None (Preliminary result)   Collection Time: 06/30/18  9:50 PM  Result Value Ref Range Status   Specimen Description BLOOD PORTA CATH  Final   Special Requests   Final    AEROBIC BOTTLE ONLY BCAV Performed at [facility], [place] [address] [place], [place] [address]    Culture   Final    NO GROWTH 1 DAY Performed at [facility], [place] [address], [place], [place] [address]    Report Status PENDING  Incomplete  Culture, blood (Routine X 2) w Reflex to ID Panel     Status: None (Preliminary result)   Collection Time: 06/30/18  9:50 PM  Result Value Ref Range Status   Specimen Description BLOOD LEFT FOREARM  Final   Special Requests   Final    AEROBIC BOTTLE ONLY BCAV Performed at  [facility], [place] [address] [place], [place] [address]    Culture   Final    NO GROWTH 1 DAY Performed at [facility], [place] [address], [place], [place] [address]    Report Status PENDING  Incomplete  MRSA PCR Screening     Status: Abnormal   Collection Time: 07/01/18  9:54 AM  Result Value Ref Range Status   MRSA by PCR POSITIVE (A) NEGATIVE Final    Comment:        The GeneXpert MRSA Assay (FDA approved for NASAL specimens only), is one component of a comprehensive MRSA colonization surveillance program. It is not intended to diagnose MRSA infection nor to guide or monitor treatment for MRSA infections. RESULT CALLED TO, READ BACK BY AND VERIFIED WITH: Jethro Bastos [id number] @ [address] Performed at [place] [address] [place], [place] [address]      Radiology Studies: Dg Chest 2 View  Result Date: 06/30/2018 CLINICAL DATA:  Cough. Body aches. Abdominal pain. Back pain. History of bladder cancer with last chemotherapy Tuesday. Cough for couple of days. Previous smoker. History of hypertension. EXAM: CHEST - 2 VIEW COMPARISON:  06/13/2018 FINDINGS: Postoperative changes in the mediastinum. Power port type central venous catheter with tip over the cavoatrial junction region. No pneumothorax. Cardiac enlargement. Mild central pulmonary vascular congestion. No edema or consolidation in the lungs. No blunting of costophrenic angles. No pneumothorax. Calcification of the aorta. Mediastinal contours appear intact. IMPRESSION: Cardiac enlargement with mild pulmonary vascular congestion. No pulmonary edema or consolidation. Aortic atherosclerosis. Electronically Signed   By: Lucienne Capers M.D.   On: 06/30/2018 21:38   Ct Renal Stone Study  Result Date: 06/30/2018 CLINICAL DATA:  83 year old male with flank pain, abdominal and back pain. Bladder cancer with most recent chemotherapy last week. EXAM: CT ABDOMEN AND PELVIS  WITHOUT CONTRAST TECHNIQUE: Multidetector CT imaging of the abdomen and pelvis was performed following the standard protocol without IV contrast. COMPARISON:  Restaging noncontrast CT chest abdomen and pelvis 03/10/2018 and earlier. FINDINGS: Lower chest: New tree-in-bud nodularity in both lower lobes (series 6, image 22). No lower lobe consolidation or pleural effusion. Small right middle lobe  lung nodules are stable on series 6 images 3 and 7. No pericardial effusion. Stable cardiac size at the upper limits of normal. Hepatobiliary: Negative noncontrast liver and gallbladder. Pancreas: Negative. Spleen: Negative. Adrenals/Urinary Tract: Adrenal glands remain normal. Chronic partial left renal atrophy with chronic left nephrostomy tube. The nephrostomy tube has been exchanged since 03/10/2018, and the nephrostomy loop is now located in the left renal sinus. The left renal pelvis size has not significantly changed. Chronic stranding at the left ureteropelvic junction and along the course of the left ureter is stable. No left hydroureter identified. Chronic right nephroureteral stent is in place and appears appropriately positioned. Trace gas within the nondilated right renal collecting system is noted on series 2, image 29. Stable right renal pelvis size. Stable right ureter. Diminutive, decompressed urinary bladder. Bladder wall thickening, but no discrete bladder mass is evident in the absence of contrast. Stomach/Bowel: Regressed but not resolved rectal wall thickening which may be treatment related. Decompressed rectum and sigmoid colon. Mild sigmoid diverticulosis. Decompressed left colon and splenic flexure. The patient has a chronic transverse colostomy with mucous fistula. Negative transverse and right colon aside from some retained stool. Appendix is diminutive or absent. Negative terminal ileum. No dilated small bowel. Negative stomach and duodenum. No abdominal free air, free fluid. Vascular/Lymphatic:  Vascular patency is not evaluated in the absence of IV contrast. Aortoiliac calcified atherosclerosis. No acute lymphadenopathy. Occasional calcified retroperitoneal and mesenteric lymph nodes are stable. Reproductive: Stable and negative (possible small fat containing left inguinal hernia). Other: Regressed but not resolved perirectal and presacral soft tissue thickening and stranding, probably treatment related (series 2, image 74). No pelvic free fluid. Musculoskeletal: Chronic lumbar spine degeneration and prior fusion. No acute or suspicious osseous lesion identified. IMPRESSION: 1. New tree-in-bud nodularity in both lower lungs highly suspicious for acute bilateral lower lobe distal airway infection. No pleural effusion. 2. Left nephrostomy tube exchange since March. The left nephrostomy loop now projects more centrally within the renal sinus, but no acute obstruction of the left kidney is evident. Stable positioning of right nephroureteral stent with no adverse features. 3. Regressed but not resolved perirectal and presacral soft tissue thickening and stranding, probably treatment related. 4. No new abnormality identified in the noncontrast abdomen or pelvis. 5. Aortic Atherosclerosis (ICD10-I70.0). Electronically Signed   By: Genevie Ann M.D.   On: 06/30/2018 20:58    Scheduled Meds: . Chlorhexidine Gluconate Cloth  6 each Topical Q0600  . diphenhydrAMINE  25 mg Oral QHS  . famotidine  20 mg Oral Daily  . feeding supplement (ENSURE ENLIVE)  237 mL Oral BID BM  . linaclotide  290 mcg Oral QAC breakfast  . mirabegron ER  25 mg Oral Daily  . mirtazapine  15 mg Oral QHS  . mupirocin ointment  1 application Nasal BID  . oxyCODONE  10 mg Oral BID  . pramipexole  1.5 mg Oral QHS  . triamcinolone cream   Topical TID   Continuous Infusions: . sodium chloride 125 mL/hr at 07/02/18 1153  . ceFEPime (MAXIPIME) IV Stopped (07/02/18 0831)  . vancomycin       LOS: 0 days   Marylu Lund, MD [facility] Pager [phone]  If 7PM-7AM, please contact night-coverage [email or web address] Password [facility] 07/02/2018, 2:39 PM

## 2018-07-02 NOTE — Progress Notes (Signed)
Pt began c/o "not being able to get his breath". VSS. O2 sat 98% on room air. Wife states "it's like a panic attack". Still having off and on confusion. Lungs with adventitious sounds. MD notified. Order obtained. Eulas Post, RN

## 2018-07-02 NOTE — Progress Notes (Signed)
Pharmacy Antibiotic Note  KOHLTON Coffey is a 83 y.o. male admitted on 06/30/2018 with pneumonia and UTI.  Pharmacy has been consulted for Vancomycin, cefepime dosing.  7/6: SCr improving, CrCl 37. MRSA PCR positive. Urine growing GNR.  Plan: Cont cefepime 1g IV q12h. Change vancomycin to 1750mg  IV q48h (481.6, 1.5, ibw/tbw) Measure Vanc peak and trough at steady state. Goal AUC = 400 - 500 for all indications, except meningitis (goal AUC > 500 and Cmin 15-20 mcg/mL) Follow up renal function, culture results, and clinical course. Check SCr in AM.  Height: 5\' 8"  (172.7 cm) Weight: 175 lb 8 oz (79.6 kg) IBW/kg (Calculated) : 68.4  Temp (24hrs), Avg:98.7 F (37.1 C), Min:97.9 F (36.6 C), Max:99.3 F (37.4 C)  Recent Labs  Lab 06/30/18 2009 06/30/18 2146 07/01/18 0448 07/02/18 0424  WBC 5.8  --  5.9 5.3  CREATININE 1.73*  --  1.61* 1.50*  LATICACIDVEN  --  0.40*  --   --     Estimated Creatinine Clearance: 37.4 mL/min (A) (by C-G formula based on SCr of 1.5 mg/dL (H)).    Allergies  Allergen Reactions  . Antihistamines, Loratadine-Type Other (See Comments)    Unable to urinate  . Statins Other (See Comments)    liver effects   Antimicrobials this admission:  Ceftriaxone 7/4 x1 Cefepime 7/4 >>  Vancomycin 7/4 >>  Dose adjustments this admission:  7/6: SCr improved. 1250mg  iv q48h -> 1750mg  IV q48h.  Microbiology results:  7/4 BCx: ngtd 7/4 UCx: GNR 7/5 MRSA PCR: positive  Thank you for allowing pharmacy to be a part of this patient's care.  Romeo Rabon, PharmD. Mobile: [phone]. 07/02/2018,11:32 AM.

## 2018-07-03 ENCOUNTER — Encounter: Payer: Self-pay | Admitting: Oncology

## 2018-07-03 LAB — LEGIONELLA PNEUMOPHILA SEROGP 1 UR AG: L. PNEUMOPHILA SEROGP 1 UR AG: NEGATIVE

## 2018-07-03 LAB — URINE CULTURE: Culture: >=100000 — AB

## 2018-07-03 LAB — BASIC METABOLIC PANEL
Anion gap: 12 (ref 5–15)
BUN: 24 mg/dL — AB (ref 8–23)
CALCIUM: 9.8 mg/dL (ref 8.9–10.3)
CO2: 16 mmol/L — ABNORMAL LOW (ref 22–32)
CREATININE: 1.63 mg/dL — AB (ref 0.61–1.24)
Chloride: 112 mmol/L — ABNORMAL HIGH (ref 98–111)
GFR calc Af Amer: 44 mL/min — ABNORMAL LOW (ref >60)
GFR, EST NON AFRICAN AMERICAN: 38 mL/min — AB (ref >60)
GLUCOSE: 124 mg/dL — AB (ref 70–99)
POTASSIUM: 3.2 mmol/L — AB (ref 3.5–5.1)
SODIUM: 140 mmol/L (ref 135–145)

## 2018-07-03 LAB — CBC
HCT: 25.5 % — ABNORMAL LOW (ref 39.0–52.0)
Hemoglobin: 8.7 g/dL — ABNORMAL LOW (ref 13.0–17.0)
MCH: 32.3 pg (ref 26.0–34.0)
MCHC: 34.1 g/dL (ref 30.0–36.0)
MCV: 94.8 fL (ref 78.0–100.0)
Platelets: 65 10*3/uL — ABNORMAL LOW (ref 150–400)
RBC: 2.69 MIL/uL — ABNORMAL LOW (ref 4.22–5.81)
RDW: 15.5 % (ref 11.5–15.5)
WBC: 6.2 10*3/uL (ref 4.0–10.5)

## 2018-07-03 MED ORDER — POTASSIUM CHLORIDE CRYS ER 20 MEQ PO TBCR
40.0000 meq | EXTENDED_RELEASE_TABLET | Freq: Once | ORAL | Status: AC
  Administered 2018-07-03: 40 meq via ORAL
  Filled 2018-07-03: qty 2

## 2018-07-03 MED ORDER — SODIUM CHLORIDE 0.9 % IV SOLN
2.0000 g | INTRAVENOUS | Status: DC
  Administered 2018-07-04: 2 g via INTRAVENOUS
  Filled 2018-07-03: qty 2

## 2018-07-03 MED ORDER — SODIUM CHLORIDE 0.9 % IV SOLN
2.0000 g | INTRAVENOUS | Status: DC
  Administered 2018-07-03: 2 g via INTRAVENOUS
  Filled 2018-07-03: qty 2

## 2018-07-03 MED ORDER — SODIUM CHLORIDE 0.9% FLUSH: 10.0000 mL | INTRAVENOUS | Status: DC | PRN

## 2018-07-03 NOTE — Progress Notes (Signed)
PROGRESS NOTE    Charles Coffey  IZT:[id number] DOB: 1935/08/16 DOA: 06/30/2018 PCP: [name], MD    Brief Narrative:  83 y.o. male, w hypertension, hyperlipidemia, CAD s/p MI, RLS, stage 4, bladder cancer s/p resection (11/25/2017), chemo, and bilateral nephrostomy tube 2018, diverting colostomy 12/01/2017 secondary to colonic obstruction apparently presents with c/o bilateral flank pain today and therefore presented to Ed. Pt has slight dry cough.  Denies fever, chills, cp, palp, sob, n/v, blood in colostomy.  Pt presented to ED for evaluation of bilateral flank pain. CT abd with new L nephrostomy exchange and stable R nephroureteral stent. Patient was placed on observation for possible UTI  Assessment & Plan:   Principal Problem:   Lower urinary tract infectious disease Active Problems:   Anemia   Chronic kidney disease, stage 3 (HCC)   Thrombocytopenia (HCC)   Malfunction of nephrostomy tube (HCC)  Proteus UTI -UA appears consistent with UTI -Blood cx neg -Urine cx pos for multi-drug resistant proteus which is sensitive to ampicillin/sulbactam, cefazolin, rocephin, zosyn -Pt is currently continued on empiric vancomycin and cefepime -Will narrow to rocephin which would cover both PNA and above UTI -L nephrostomy site noted to be erythematous, indurated with purulence  -IR was consulted with plans for Nephrostomy tube change and nephroureteral cath exchange on 7/8  Healthcare associated Pneumonia present on admission -Imaging personally reviewed. Findings of B infiltrates on CT chest consistent with PNA -Urine strep antigen negative -Patient is continued on Vanco iv, cefepime  -Narrow coverage to rocephin per above  Anemia/ Thrombocytopenia -Plts trend reviewed. Overall, improving since admission -Continue to avoid heparin products -Repeat CBC in AM  CKD stage3 -Recheck bmet in AM  RLS -continue with mirapex 1.5mg  po qhs as tolerated  Gerd -Cont H2 blocker  as patient tolerates  Constipation -Patient had been on continued Linzess -CT abd personally reviewed. Stool noted primarily throughout ascending colon -Very good results thus far with addition of lactulose with resolution of R sided abd pain -Cont with cathartics as needed  CAD -Cont carvedilol as tolerated -Cont lasix for now -Without chest pains  Anxiety/Panic attack -patient noted to have panic attack this AM -on PRN vistaril  DVT prophylaxis: SCd's Code Status: Full Family Communication: Pt in room, family at bedside Disposition Plan: Uncertain at this time  Consultants:   IR  Procedures:     Antimicrobials: Anti-infectives (From admission, onward)   Start     Dose/Rate Route Frequency Ordered Stop   07/03/18 1000  ceFEPIme (MAXIPIME) 2 g in sodium chloride 0.9 % 100 mL IVPB     2 g 200 mL/hr over 30 Minutes Intravenous Every 24 hours 07/03/18 0909     07/02/18 1800  vancomycin (VANCOCIN) 1,250 mg in sodium chloride 0.9 % 250 mL IVPB  Status:  Discontinued     1,250 mg 166.7 mL/hr over 90 Minutes Intravenous Every 48 hours 07/01/18 0511 07/02/18 1132   07/02/18 1400  vancomycin (VANCOCIN) 1,750 mg in sodium chloride 0.9 % 500 mL IVPB     1,750 mg 250 mL/hr over 120 Minutes Intravenous Every 48 hours 07/02/18 1132     06/30/18 2345  vancomycin (VANCOCIN) 1,250 mg in sodium chloride 0.9 % 250 mL IVPB     1,250 mg 166.7 mL/hr over 90 Minutes Intravenous  Once 06/30/18 2330 07/01/18 0135   06/30/18 2345  ceFEPIme (MAXIPIME) 1 g in sodium chloride 0.9 % 100 mL IVPB  Status:  Discontinued     1 g 200  mL/hr over 30 Minutes Intravenous Every 12 hours 06/30/18 2330 07/03/18 0909   06/30/18 2130  cefTRIAXone (ROCEPHIN) 1 g in sodium chloride 0.9 % 100 mL IVPB  Status:  Discontinued     1 g 200 mL/hr over 30 Minutes Intravenous Every 24 hours 06/30/18 2116 06/30/18 2329   06/30/18 2130  sodium chloride 0.9 % with cefTRIAXone (ROCEPHIN) ADS Med    Note to Pharmacy:   Talbert Cage   : cabinet override      06/30/18 2130 07/01/18 0929      Subjective: Confused overnight, back to baseline today. Without complaints  Objective: Vitals:   07/02/18 2145 07/03/18 0500 07/03/18 0540 07/03/18 1431  BP: 118/85  117/66 119/75  Pulse: 93  83 86  Resp: 20  14 18   Temp: 98.4 F (36.9 C)  98.1 F (36.7 C) 98.5 F (36.9 C)  TempSrc: Oral  Oral Oral  SpO2: 91%  100% 100%  Weight:  79.5 kg (175 lb 4.8 oz)    Height:        Intake/Output Summary (Last 24 hours) at 07/03/2018 1448 Last data filed at 07/03/2018 0600 Gross per 24 hour  Intake 2091.67 ml  Output 575 ml  Net 1516.67 ml   Filed Weights   07/01/18 0509 07/02/18 0543 07/03/18 0500  Weight: 80.2 kg (176 lb 12.9 oz) 79.6 kg (175 lb 8 oz) 79.5 kg (175 lb 4.8 oz)    Examination: General exam: Awake, laying in bed, in nad Respiratory system: Normal respiratory effort, no wheezing Cardiovascular system: regular rate, s1, s2 Gastrointestinal system: Soft, nondistended, positive BS Central nervous system: CN2-12 grossly intact, strength intact Extremities: Perfused, no clubbing Skin: Normal skin turgor, no notable skin lesions seen Psychiatry: Mood normal // no visual hallucinations   Data Reviewed: I have personally reviewed following labs and imaging studies  CBC: Recent Labs  Lab 06/30/18 2009 07/01/18 0448 07/02/18 0424 07/03/18 0420  WBC 5.8 5.9 5.3 6.2  NEUTROABS 3.1  --   --   --   HGB 8.8* 7.1* 7.4* 8.7*  HCT 26.6* 21.1* 22.2* 25.5*  MCV 96.7 96.3 96.9 94.8  PLT 53* 55* 56* 65*   Basic Metabolic Panel: Recent Labs  Lab 06/30/18 2009 07/01/18 0448 07/02/18 0424 07/03/18 0420  NA 138 134* 139 140  K 4.0 4.1 3.8 3.2*  CL 111 109 113* 112*  CO2 19* 18* 18* 16*  GLUCOSE 114* 94 99 124*  BUN 33* 31* 24* 24*  CREATININE 1.73* 1.61* 1.50* 1.63*  CALCIUM 9.5 9.1 9.2 9.8   GFR: Estimated Creatinine Clearance: 34.4 mL/min (A) (by C-G formula based on SCr of 1.63 mg/dL  (H)). Liver Function Tests: Recent Labs  Lab 06/30/18 2009 07/01/18 0448  AST 15 16  ALT 15 13  ALKPHOS 48 42  BILITOT 0.4 0.9  PROT 6.6 6.0*  ALBUMIN 3.3* 3.0*   No results for input(s): LIPASE, AMYLASE in the last 168 hours. No results for input(s): AMMONIA in the last 168 hours. Coagulation Profile: No results for input(s): INR, PROTIME in the last 168 hours. Cardiac Enzymes: No results for input(s): CKTOTAL, CKMB, CKMBINDEX, TROPONINI in the last 168 hours. BNP (last 3 results) No results for input(s): PROBNP in the last 8760 hours. HbA1C: No results for input(s): HGBA1C in the last 72 hours. CBG: No results for input(s): GLUCAP in the last 168 hours. Lipid Profile: No results for input(s): CHOL, HDL, LDLCALC, TRIG, CHOLHDL, LDLDIRECT in the last 72 hours. Thyroid Function Tests: No  results for input(s): TSH, T4TOTAL, FREET4, T3FREE, THYROIDAB in the last 72 hours. Anemia Panel: No results for input(s): VITAMINB12, FOLATE, FERRITIN, TIBC, IRON, RETICCTPCT in the last 72 hours. Sepsis Labs: Recent Labs  Lab 06/30/18 2146  LATICACIDVEN 0.40*    Recent Results (from the past 240 hour(s))  Urine Culture     Status: Abnormal   Collection Time: 06/30/18  7:49 PM  Result Value Ref Range Status   Specimen Description   Final    URINE, CLEAN CATCH Performed at [facility], [place] [address], [place], [place] [address]    Special Requests   Final    NONE Performed at [facility], [place] [address], [place], [place] [address]    Culture >=100,000 COLONIES/mL PROTEUS MIRABILIS (A)  Final   Report Status 07/03/2018 FINAL  Final   Organism ID, Bacteria PROTEUS MIRABILIS (A)  Final      Susceptibility   Proteus mirabilis - MIC*    AMPICILLIN >=32 RESISTANT Resistant     CEFAZOLIN <=4 SENSITIVE Sensitive     CEFTRIAXONE <=1 SENSITIVE Sensitive     CIPROFLOXACIN >=4 RESISTANT Resistant     GENTAMICIN <=1 SENSITIVE Sensitive     IMIPENEM  8 INTERMEDIATE Intermediate     NITROFURANTOIN RESISTANT Resistant     TRIMETH/SULFA >=320 RESISTANT Resistant     AMPICILLIN/SULBACTAM 4 SENSITIVE Sensitive     PIP/TAZO <=4 SENSITIVE Sensitive     * >=100,000 COLONIES/mL PROTEUS MIRABILIS  Culture, blood (Routine X 2) w Reflex to ID Panel     Status: None (Preliminary result)   Collection Time: 06/30/18  9:50 PM  Result Value Ref Range Status   Specimen Description BLOOD PORTA CATH  Final   Special Requests   Final    AEROBIC BOTTLE ONLY BCAV Performed at [place] [address], [place], [place] [address]    Culture   Final    NO GROWTH 2 DAYS Performed at [place] [address] [place], [place] [address]    Report Status PENDING  Incomplete  Culture, blood (Routine X 2) w Reflex to ID Panel     Status: None (Preliminary result)   Collection Time: 06/30/18  9:50 PM  Result Value Ref Range Status   Specimen Description BLOOD LEFT FOREARM  Final   Special Requests   Final    AEROBIC BOTTLE ONLY BCAV Performed at [facility], [place] [address] [place], [place] [address]    Culture   Final    NO GROWTH 2 DAYS Performed at [facility] [address] [place], [place] [address]    Report Status PENDING  Incomplete  MRSA PCR Screening     Status: Abnormal   Collection Time: 07/01/18  9:54 AM  Result Value Ref Range Status   MRSA by PCR POSITIVE (A) NEGATIVE Final    Comment:        The GeneXpert MRSA Assay (FDA approved for NASAL specimens only), is one component of a comprehensive MRSA colonization surveillance program. It is not intended to diagnose MRSA infection nor to guide or monitor treatment for MRSA infections. RESULT CALLED TO, READ BACK BY AND VERIFIED WITH: Jethro Bastos [id number] @ [address] Performed at [facility] [address], [place], [place] [address]      Radiology Studies: Dg Chest Port 1 View  Result  Date: 07/02/2018 CLINICAL DATA:  Evaluate pneumonia. History of hypertension, coronary artery  disease. EXAM: PORTABLE CHEST 1 VIEW COMPARISON:  Chest x-rays dated 06/30/2018 and 06/13/2018. FINDINGS: Stable cardiomegaly. Aortic atherosclerosis. Mild central pulmonary vascular congestion is stable, without overt alveolar pulmonary edema. No confluent opacity to suggest pneumonia on today's exam. No pleural effusion or pneumothorax seen. RIGHT chest wall Port-A-Cath is stable in position with tip at the level of the lower SVC. No acute or suspicious osseous finding. IMPRESSION: 1. No evidence of pneumonia on today's exam. 2. Mild central pulmonary vascular congestion without overt alveolar pulmonary edema. 3. Stable mild cardiomegaly. 4. Aortic atherosclerosis. Electronically Signed   By: Franki Cabot M.D.   On: 07/02/2018 18:00    Scheduled Meds: . Chlorhexidine Gluconate Cloth  6 each Topical Q0600  . diphenhydrAMINE  25 mg Oral QHS  . famotidine  20 mg Oral Daily  . feeding supplement (ENSURE ENLIVE)  237 mL Oral BID BM  . linaclotide  290 mcg Oral QAC breakfast  . mirabegron ER  25 mg Oral Daily  . mirtazapine  15 mg Oral QHS  . mupirocin ointment  1 application Nasal BID  . oxyCODONE  10 mg Oral BID  . pramipexole  1.5 mg Oral QHS  . triamcinolone cream   Topical TID   Continuous Infusions: . ceFEPime (MAXIPIME) IV Stopped (07/03/18 1030)  . vancomycin Stopped (07/02/18 1900)     LOS: 1 day   Marylu Lund, MD [facility] Pager [phone]  If 7PM-7AM, please contact night-coverage [email or web address] Password [email or web address] 07/03/2018, 2:48 PM

## 2018-07-03 NOTE — Progress Notes (Signed)
Pharmacy Antibiotic Note  Charles Coffey is a 83 y.o. male admitted on 06/30/2018 with pneumonia and UTI.  Pharmacy has been consulted for Vancomycin, cefepime dosing.  7/7: SCr bumped back up. CrCl ~3437. Proteus in urine sensitive to abx with narrower spectrum but also covering for HCAP so won't recommend narrowing at this time. Continuing Vanc since MRSA PCR was positive. F/u for narrowing when appropriate.   Plan: Change cefepime to 2g IV q24h. Cont vancomycin to 1750mg  IV q48h (481.6, 1.5, ibw/tbw) Measure Vanc peak and trough at steady state. Goal AUC = 400 - 500 for all indications, except meningitis (goal AUC > 500 and Cmin 15-20 mcg/mL) Follow up renal function, culture results, and clinical course.  Height: 5\' 8"  (172.7 cm) Weight: 175 lb 4.8 oz (79.5 kg) IBW/kg (Calculated) : 68.4  Temp (24hrs), Avg:98.2 F (36.8 C), Min:98 F (36.7 C), Max:98.4 F (36.9 C)  Recent Labs  Lab 06/30/18 2009 06/30/18 2146 07/01/18 0448 07/02/18 0424 07/03/18 0420  WBC 5.8  --  5.9 5.3 6.2  CREATININE 1.73*  --  1.61* 1.50* 1.63*  LATICACIDVEN  --  0.40*  --   --   --     Estimated Creatinine Clearance: 34.4 mL/min (A) (by C-G formula based on SCr of 1.63 mg/dL (H)).    Allergies  Allergen Reactions  . Antihistamines, Loratadine-Type Other (See Comments)    Unable to urinate  . Statins Other (See Comments)    liver effects   Antimicrobials this admission:  Ceftriaxone 7/4 x1 Cefepime 7/4 >>  Vancomycin 7/4 >>  Dose adjustments this admission:  7/6: SCr improved. 1250mg  iv q48h -> 1750mg  IV q48h. 7/7: SCr bumped back up. Cefepime 1g q12h -> change to 2g q24h per dosing table.   Microbiology results:  7/4 BCx: ngtd 7/4 UCx: GNR -> proteus mirabilis sens to Unasyn, ancef, CTX, gent, Zosyn 7/5 MRSA PCR: positive  Thank you for allowing pharmacy to be a part of this patient's care.  Romeo Rabon, PharmD. Mobile: [phone]. 07/03/2018,9:04 AM.

## 2018-07-03 NOTE — Progress Notes (Signed)
Referring Physician(s): Chiu,S  Supervising Physician: Daryll Brod  Patient Status:  [facility] - In-pt  Chief Complaint:  Bilateral flank pain, UTI  Subjective: Patient familiar to IR service from prior left nephrostomy on 11/30/2017 for left ureteral obstruction and severe hydronephrosis secondary to urothelial carcinoma involving the distal ureter and bladder trigone, right percutaneous nephrostomy on 12/03/2017 secondary to hydronephrosis, Port-A-Cath placement on 12/27/2017, conversion of right nephrostomy to right nephroureteral drain on 02/04/2018 as well as multiple PCN exchanges, latest on 04/11/18.  He presented to the hospital on 7/6 with bilateral flank pain and UTI.  CT scan revealed appropriate positioning of right nephroureteral catheter but partially retracted left nephrostomy.  Request now received for exchange of both catheters.  Currently denies fever, headache, chest pain, worsening dyspnea, nausea, vomiting or bleeding.  Past Medical History:  Diagnosis Date  . Aortic atherosclerosis ([place])   . BPH with urinary obstruction   . CAD (coronary artery disease)    a.  MI 1995, CABG x 3 2002 (patient says that he had LIMA and RIMA grafts). b. ETT-Cardiolite (10/15) with EF 48%, apical scar, no ischemia. c. Nuc 07/2017 abnormal -> cath was performed,  patent LIMA to LAD and SVG to OM 2. RIMA to RCA is atretic. The right coronary artery is occluded distally with extensive collaterals from the LAD, medical therapy.   . Cancer [facility])    bladder cancer  . Cervical spondylosis without myelopathy 2020/07/1216  . Chronic low back pain    sees Dr. Kary Kos   . GERD (gastroesophageal reflux disease)   . Hiatal hernia   . Hyperlipidemia   . Hypertension   . IBS (irritable bowel syndrome)   . Ischemic cardiomyopathy   . Myocardial infarction ([place])   . RBBB   . Restless legs syndrome   . Statin intolerance   . Syncope    a. in 2015 - no apparent cause, was taking sleep medicine at the  time. Cardiac workup unremarkable.  . Tubular adenoma of colon    Past Surgical History:  Procedure Laterality Date  . COLONOSCOPY  10/17/2014   per Dr. Hilarie Fredrickson, tubular adenomas, repeat in 3 yrs   . COLONOSCOPY WITH PROPOFOL N/A 12/01/2017   Procedure: COLONOSCOPY WITH PROPOFOL;  Surgeon: Milus Banister, MD;  Location: [facility];  Service: Endoscopy;  Laterality: N/A;  . CYSTOSCOPY W/ RETROGRADES Left 11/25/2017   Procedure: CYSTOSCOPY WITH RETROGRADE PYELOGRAM;  Surgeon: Irine Seal, MD;  Location: [facility];  Service: Urology;  Laterality: Left;  . HEART BYPASS    . IR CONVERT RIGHT NEPHROSTOMY TO NEPHROURETERAL CATH  02/04/2018  . IR EXT NEPHROURETERAL CATH EXCHANGE  04/11/2018  . IR FLUORO GUIDE CV LINE RIGHT  12/27/2017  . IR NEPHROSTOGRAM LEFT THRU EXISTING ACCESS  12/24/2017  . IR NEPHROSTOMY EXCHANGE LEFT  01/28/2018  . IR NEPHROSTOMY EXCHANGE LEFT  02/04/2018  . IR NEPHROSTOMY EXCHANGE LEFT  02/25/2018  . IR NEPHROSTOMY EXCHANGE RIGHT  12/24/2017  . IR NEPHROSTOMY EXCHANGE RIGHT  01/28/2018  . IR NEPHROSTOMY EXCHANGE RIGHT  04/11/2018  . IR NEPHROSTOMY PLACEMENT LEFT  11/28/2017  . IR NEPHROSTOMY PLACEMENT RIGHT  12/03/2017  . IR PATIENT EVAL TECH 0-60 MINS  03/24/2018  . IR PATIENT EVAL TECH 0-60 MINS  03/28/2018  . IR PATIENT EVAL TECH 0-60 MINS  04/25/2018  . IR PATIENT EVAL TECH 0-60 MINS  06/20/2018  . IR US GUIDE VASC ACCESS RIGHT  12/27/2017  . LAPAROSCOPY N/A 12/01/2017   Procedure: LAPAROSCOPIC  DIVERTING OSTOMY;  Surgeon: Stark Klein, MD;  Location: [facility];  Service: General;  Laterality: N/A;  . LEFT HEART CATH AND CORS/GRAFTS ANGIOGRAPHY N/A 08/25/2017   Procedure: LEFT HEART CATH AND CORS/GRAFTS ANGIOGRAPHY;  Surgeon: Wellington Hampshire, MD;  Location: [facility];  Service: Cardiovascular;  Laterality: N/A;  . LUMBAR FUSION  2003   L3-L4  . PROSTATE SURGERY  06-27-12   per Dr. Roni Bread, had CTT  . TONSILLECTOMY    . TRANSURETHRAL RESECTION OF BLADDER TUMOR N/A 11/25/2017    Procedure: TRANSURETHRAL RESECTION OF BLADDER TUMOR (TURBT);  Surgeon: Irine Seal, MD;  Location: [facility];  Service: Urology;  Laterality: N/A;     Allergies: Antihistamines, loratadine-type and Statins  Medications: Prior to Admission medications   Medication Sig Start Date End Date Taking? Authorizing Provider  acetaminophen (TYLENOL) 500 MG tablet Take 1,000 mg by mouth every 8 (eight) hours as needed for mild pain or moderate pain.   Yes [provider]  carvedilol (COREG) 6.25 MG tablet Take 1 tablet (6.25 mg total) 2 (two) times daily with a meal by mouth. 11/09/17  Yes Larey Dresser, MD  dicyclomine (BENTYL) 10 MG capsule TAKE 1-2 BY MOUTH EVERY 6 HOURS AS NEEDED FOR RECTAL SPASMS. 03/07/18  Yes Pyrtle, Lajuan Lines, MD  diphenhydrAMINE (BENADRYL) 50 MG tablet Take 100 mg by mouth at bedtime.   Yes [provider]  feeding supplement, ENSURE ENLIVE, (ENSURE ENLIVE) LIQD Take 237 mLs by mouth 2 (two) times daily between meals. 02/03/18  Yes Short, Noah Delaine, MD  furosemide (LASIX) 40 MG tablet Take 1 tablet (40 mg total) by mouth 2 (two) times daily. Patient taking differently: Take 40 mg by mouth as needed for fluid or edema.  05/13/18  Yes End, Harrell Gave, MD  lidocaine-prilocaine (EMLA) cream Apply 1 application topically as needed. 12/15/17  Yes Shadad, Mathis Dad, MD  LINZESS 290 MCG CAPS capsule TAKE 1 CAP BY MOUTH ONCE DAILY 30 MIN PRIOR TO A MEAL. MUST HAVE OFFICE VISIT FOR FURTHER REFILLS 05/12/18  Yes Pyrtle, Lajuan Lines, MD  megestrol (MEGACE) 40 MG/ML suspension TAKE 10ML BY MOUTH TWICE A DAY 05/13/18  Yes Shadad, Mathis Dad, MD  mirabegron ER (MYRBETRIQ) 25 MG TB24 tablet Take 1 tablet (25 mg total) by mouth daily. 02/04/18  Yes Short, Noah Delaine, MD  mirtazapine (REMERON) 15 MG tablet Take 1 tablet (15 mg total) by mouth at bedtime. 05/30/18  Yes Tanner, Lyndon Code., PA-C  nitroGLYCERIN (NITROSTAT) 0.4 MG SL tablet Place 1 tablet (0.4 mg total) under the tongue every 5 (five) minutes as  needed. 08/19/17 06/30/18 Yes Dunn, Dayna N, PA-C  oxyCODONE (OXYCONTIN) 10 mg 12 hr tablet Take 1 tablet (10 mg total) by mouth 2 (two) times daily. 06/02/18  Yes Wyatt Portela, MD  oxyCODONE-acetaminophen (PERCOCET) 10-325 MG tablet Take 1 tablet by mouth every 4 (four) hours as needed for pain. 05/16/18  Yes Wyatt Portela, MD  pramipexole (MIRAPEX) 1 MG tablet TAKE 1 AND 1/2 TABLETS BY MOUTH AT BEDTIME 06/29/18  Yes [name], MD  prochlorperazine (COMPAZINE) 10 MG tablet Take 1 tablet (10 mg total) by mouth every 6 (six) hours as needed for nausea or vomiting. 12/15/17  Yes Wyatt Portela, MD  ranitidine (ZANTAC) 300 MG tablet TAKE 1 TABLET BY MOUTH TWICE A DAY 02/10/18  Yes Pyrtle, Lajuan Lines, MD  Tetrahydroz-Glyc-Hyprom-PEG (VISINE MAXIMUM REDNESS RELIEF OP) Place 2 drops into both eyes 2 (two) times daily as needed (dry eyes).  Yes [provider]  traMADol (ULTRAM) 50 MG tablet Take 50 mg by mouth as needed for moderate pain.   Yes [provider]  guaiFENesin-codeine 100-10 MG/5ML syrup Take 5 mLs by mouth every 6 (six) hours as needed for cough. Patient not taking: Reported on 06/30/2018 06/13/18   Harle Stanford., PA-C  levofloxacin (LEVAQUIN) 500 MG tablet Take 1 tablet (500 mg total) by mouth daily. Patient not taking: Reported on 06/30/2018 06/13/18   Sandi Mealy E., PA-C  traZODone (DESYREL) 50 MG tablet TAKE 1 TABLET BY MOUTH AT BEDTIME AS NEEDED FOR SLEEP. Patient not taking: Reported on 06/30/2018 04/14/18   [name], MD     Vital Signs: BP 117/66 (BP Location: Left Arm)   Pulse 83   Temp 98.1 F (36.7 C) (Oral)   Resp 14   Ht 5\' 8"  (1.727 m)   Wt 175 lb 4.8 oz (79.5 kg)   SpO2 100%   BMI 26.65 kg/m   Physical Exam awake, alert.  Chest clear to auscultation bilaterally.  Heart with regular rate and rhythm.  Abdomen soft, positive bowel sounds, intact colostomy, some mild generalized tenderness to palpation.  Left nephrostomy catheter intact, small amount of  skin breakdown, erythema as well as minimal induration at insertion site.  Right nephrostomy catheter intact, insertion site okay.  No lower extremity edema  Imaging: Dg Chest 2 View  Result Date: 06/30/2018 CLINICAL DATA:  Cough. Body aches. Abdominal pain. Back pain. History of bladder cancer with last chemotherapy Tuesday. Cough for couple of days. Previous smoker. History of hypertension. EXAM: CHEST - 2 VIEW COMPARISON:  06/13/2018 FINDINGS: Postoperative changes in the mediastinum. Power port type central venous catheter with tip over the cavoatrial junction region. No pneumothorax. Cardiac enlargement. Mild central pulmonary vascular congestion. No edema or consolidation in the lungs. No blunting of costophrenic angles. No pneumothorax. Calcification of the aorta. Mediastinal contours appear intact. IMPRESSION: Cardiac enlargement with mild pulmonary vascular congestion. No pulmonary edema or consolidation. Aortic atherosclerosis. Electronically Signed   By: Lucienne Capers M.D.   On: 06/30/2018 21:38   Dg Chest Port 1 View  Result Date: 07/02/2018 CLINICAL DATA:  Evaluate pneumonia. History of hypertension, coronary artery disease. EXAM: PORTABLE CHEST 1 VIEW COMPARISON:  Chest x-rays dated 06/30/2018 and 06/13/2018. FINDINGS: Stable cardiomegaly. Aortic atherosclerosis. Mild central pulmonary vascular congestion is stable, without overt alveolar pulmonary edema. No confluent opacity to suggest pneumonia on today's exam. No pleural effusion or pneumothorax seen. RIGHT chest wall Port-A-Cath is stable in position with tip at the level of the lower SVC. No acute or suspicious osseous finding. IMPRESSION: 1. No evidence of pneumonia on today's exam. 2. Mild central pulmonary vascular congestion without overt alveolar pulmonary edema. 3. Stable mild cardiomegaly. 4. Aortic atherosclerosis. Electronically Signed   By: Franki Cabot M.D.   On: 07/02/2018 18:00   Ct Renal Stone Study  Result Date:  06/30/2018 CLINICAL DATA:  83 year old male with flank pain, abdominal and back pain. Bladder cancer with most recent chemotherapy last week. EXAM: CT ABDOMEN AND PELVIS WITHOUT CONTRAST TECHNIQUE: Multidetector CT imaging of the abdomen and pelvis was performed following the standard protocol without IV contrast. COMPARISON:  Restaging noncontrast CT chest abdomen and pelvis 03/10/2018 and earlier. FINDINGS: Lower chest: New tree-in-bud nodularity in both lower lobes (series 6, image 22). No lower lobe consolidation or pleural effusion. Small right middle lobe lung nodules are stable on series 6 images 3 and 7. No pericardial effusion. Stable cardiac size  at the upper limits of normal. Hepatobiliary: Negative noncontrast liver and gallbladder. Pancreas: Negative. Spleen: Negative. Adrenals/Urinary Tract: Adrenal glands remain normal. Chronic partial left renal atrophy with chronic left nephrostomy tube. The nephrostomy tube has been exchanged since 03/10/2018, and the nephrostomy loop is now located in the left renal sinus. The left renal pelvis size has not significantly changed. Chronic stranding at the left ureteropelvic junction and along the course of the left ureter is stable. No left hydroureter identified. Chronic right nephroureteral stent is in place and appears appropriately positioned. Trace gas within the nondilated right renal collecting system is noted on series 2, image 29. Stable right renal pelvis size. Stable right ureter. Diminutive, decompressed urinary bladder. Bladder wall thickening, but no discrete bladder mass is evident in the absence of contrast. Stomach/Bowel: Regressed but not resolved rectal wall thickening which may be treatment related. Decompressed rectum and sigmoid colon. Mild sigmoid diverticulosis. Decompressed left colon and splenic flexure. The patient has a chronic transverse colostomy with mucous fistula. Negative transverse and right colon aside from some retained stool.  Appendix is diminutive or absent. Negative terminal ileum. No dilated small bowel. Negative stomach and duodenum. No abdominal free air, free fluid. Vascular/Lymphatic: Vascular patency is not evaluated in the absence of IV contrast. Aortoiliac calcified atherosclerosis. No acute lymphadenopathy. Occasional calcified retroperitoneal and mesenteric lymph nodes are stable. Reproductive: Stable and negative (possible small fat containing left inguinal hernia). Other: Regressed but not resolved perirectal and presacral soft tissue thickening and stranding, probably treatment related (series 2, image 74). No pelvic free fluid. Musculoskeletal: Chronic lumbar spine degeneration and prior fusion. No acute or suspicious osseous lesion identified. IMPRESSION: 1. New tree-in-bud nodularity in both lower lungs highly suspicious for acute bilateral lower lobe distal airway infection. No pleural effusion. 2. Left nephrostomy tube exchange since March. The left nephrostomy loop now projects more centrally within the renal sinus, but no acute obstruction of the left kidney is evident. Stable positioning of right nephroureteral stent with no adverse features. 3. Regressed but not resolved perirectal and presacral soft tissue thickening and stranding, probably treatment related. 4. No new abnormality identified in the noncontrast abdomen or pelvis. 5. Aortic Atherosclerosis (ICD10-I70.0). Electronically Signed   By: Genevie Ann M.D.   On: 06/30/2018 20:58    Labs:  CBC: Recent Labs    06/30/18 2009 07/01/18 0448 07/02/18 0424 07/03/18 0420  WBC 5.8 5.9 5.3 6.2  HGB 8.8* 7.1* 7.4* 8.7*  HCT 26.6* 21.1* 22.2* 25.5*  PLT 53* 55* 56* 65*    COAGS: Recent Labs    08/24/17 0948 12/03/17 0550 12/27/17 1227  INR 1.0 1.22 1.13  APTT  --   --  33    BMP: Recent Labs    06/30/18 2009 07/01/18 0448 07/02/18 0424 07/03/18 0420  NA 138 134* 139 140  K 4.0 4.1 3.8 3.2*  CL 111 109 113* 112*  CO2 19* 18* 18* 16*    GLUCOSE 114* 94 99 124*  BUN 33* 31* 24* 24*  CALCIUM 9.5 9.1 9.2 9.8  CREATININE 1.73* 1.61* 1.50* 1.63*  GFRNONAA 35* 38* 42* 38*  GFRAA 41* 45* 49* 44*    LIVER FUNCTION TESTS: Recent Labs    06/13/18 1237 06/21/18 1135 06/30/18 2009 07/01/18 0448  BILITOT 0.3 0.3 0.4 0.9  AST 13 13* 15 16  ALT 9 12 15 13   ALKPHOS 63 65 48 42  PROT 7.3 7.5 6.6 6.0*  ALBUMIN 3.5 3.8 3.3* 3.0*    Assessment and  Plan: Pt with hx left nephrostomy on 11/30/2017 for left ureteral obstruction and severe hydronephrosis secondary to urothelial carcinoma involving the distal ureter and bladder trigone, right percutaneous nephrostomy on 12/03/2017 secondary to hydronephrosis, Port-A-Cath placement on 12/27/2017, conversion of right nephrostomy to right nephroureteral drain on 02/04/2018 as well as multiple PCN exchanges, latest on 04/11/18.  He presented to the hospital on 7/6 with bilateral flank pain and UTI.  CT scan revealed appropriate positioning of right nephroureteral catheter but partially retracted left nephrostomy.  Request now received for exchange of both catheters.  Imaging studies have been reviewed by Dr. Earleen Newport.  Details/risks of procedures, including but not limited to, internal bleeding, infection, injury to adjacent structures discussed with patient and wife with their understanding consent.  Procedure tentatively scheduled for 7/8.   Electronically Signed: D. Rowe Robert, PA-C 07/03/2018, 12:29 PM   I spent a total of 20 minutes at the the patient's bedside AND on the patient's hospital floor or unit, greater than 50% of which was counseling/coordinating care for left nephrostomy and right nephroureteral catheter exchanges    Patient ID: Charles Coffey, male   DOB: 09/07/1935, 83 y.o.   MRN: [id number]

## 2018-07-03 NOTE — Progress Notes (Signed)
Patient was coming out of bed, staff went to check on patient as bed alarm was on bed. 2 members of staff asked the patient if he needed anything and the patient said for [place] to "F&%$ off". The patient acted hostile and so the security and police were notified. The [employer] officer came and spoke with the patient. The patient insisted that he was not at the hospital and stated that as soon as his wife came today he wanted to go home. The patient asked the [employer] if he was going to shoot him, as he had a gun. The [employer] of course replied that he was not going to shoot the patient. Then patient stated that if he had a gun he might shoot! The patient sat on the end of the bed and allowed the IV team to draw blood from the port. The patient still insisted that "something was wrong".  PCP to be notified.

## 2018-07-04 ENCOUNTER — Encounter (HOSPITAL_COMMUNITY): Payer: Self-pay | Admitting: Interventional Radiology

## 2018-07-04 ENCOUNTER — Inpatient Hospital Stay (HOSPITAL_COMMUNITY): Payer: Medicare HMO

## 2018-07-04 ENCOUNTER — Encounter: Payer: Self-pay | Admitting: Oncology

## 2018-07-04 HISTORY — PX: IR EXT NEPHROURETERAL CATH EXCHANGE: IMG5418

## 2018-07-04 HISTORY — PX: IR NEPHROSTOMY EXCHANGE LEFT: IMG6069

## 2018-07-04 LAB — BASIC METABOLIC PANEL
Anion gap: 6 (ref 5–15)
BUN: 22 mg/dL (ref 8–23)
CALCIUM: 9.4 mg/dL (ref 8.9–10.3)
CO2: 20 mmol/L — ABNORMAL LOW (ref 22–32)
CREATININE: 1.45 mg/dL — AB (ref 0.61–1.24)
Chloride: 115 mmol/L — ABNORMAL HIGH (ref 98–111)
GFR calc Af Amer: 51 mL/min — ABNORMAL LOW (ref >60)
GFR calc non Af Amer: 44 mL/min — ABNORMAL LOW (ref >60)
Glucose, Bld: 95 mg/dL (ref 70–99)
Potassium: 3.8 mmol/L (ref 3.5–5.1)
SODIUM: 141 mmol/L (ref 135–145)

## 2018-07-04 LAB — CBC WITH DIFFERENTIAL/PLATELET
BASOS PCT: 0 %
Basophils Absolute: 0 10*3/uL (ref 0.0–0.1)
EOS ABS: 0.7 10*3/uL (ref 0.0–0.7)
Eosinophils Relative: 19 %
HCT: 22.6 % — ABNORMAL LOW (ref 39.0–52.0)
HEMOGLOBIN: 7.5 g/dL — AB (ref 13.0–17.0)
Lymphocytes Relative: 30 %
Lymphs Abs: 1.1 10*3/uL (ref 0.7–4.0)
MCH: 31.8 pg (ref 26.0–34.0)
MCHC: 33.2 g/dL (ref 30.0–36.0)
MCV: 95.8 fL (ref 78.0–100.0)
MONOS PCT: 7 %
Monocytes Absolute: 0.3 10*3/uL (ref 0.1–1.0)
NEUTROS PCT: 44 %
Neutro Abs: 1.6 10*3/uL — ABNORMAL LOW (ref 1.7–7.7)
Platelets: 69 10*3/uL — ABNORMAL LOW (ref 150–400)
RBC: 2.36 MIL/uL — ABNORMAL LOW (ref 4.22–5.81)
RDW: 15.6 % — AB (ref 11.5–15.5)
WBC: 3.6 10*3/uL — ABNORMAL LOW (ref 4.0–10.5)

## 2018-07-04 LAB — PROTIME-INR
INR: 1.22
PROTHROMBIN TIME: 15.3 s — AB (ref 11.4–15.2)

## 2018-07-04 MED ORDER — LIDOCAINE HCL 1 % IJ SOLN
INTRAMUSCULAR | Status: AC
  Filled 2018-07-04: qty 20

## 2018-07-04 MED ORDER — FENTANYL CITRATE (PF) 100 MCG/2ML IJ SOLN
INTRAMUSCULAR | Status: AC
  Filled 2018-07-04: qty 2

## 2018-07-04 MED ORDER — MIDAZOLAM HCL 2 MG/2ML IJ SOLN
INTRAMUSCULAR | Status: AC
  Filled 2018-07-04: qty 4

## 2018-07-04 MED ORDER — HEPARIN SOD (PORK) LOCK FLUSH 100 UNIT/ML IV SOLN
500.0000 [IU] | INTRAVENOUS | Status: AC | PRN
  Administered 2018-07-04: 500 [IU]

## 2018-07-04 MED ORDER — CEFDINIR 300 MG PO CAPS: 300.0000 mg | ORAL_CAPSULE | Freq: Two times a day (BID) | ORAL | 0 refills | Status: DC

## 2018-07-04 MED ORDER — CARVEDILOL 3.125 MG PO TABS: 3.1250 mg | ORAL_TABLET | Freq: Two times a day (BID) | ORAL | 0 refills | Status: DC

## 2018-07-04 MED ORDER — TRIAMCINOLONE ACETONIDE 0.1 % EX CREA: TOPICAL_CREAM | Freq: Three times a day (TID) | CUTANEOUS | 0 refills | Status: DC

## 2018-07-04 MED ORDER — FENTANYL CITRATE (PF) 100 MCG/2ML IJ SOLN
INTRAMUSCULAR | Status: AC | PRN
  Administered 2018-07-04: 50 ug via INTRAVENOUS

## 2018-07-04 MED ORDER — IOPAMIDOL (ISOVUE-300) INJECTION 61%
INTRAVENOUS | Status: AC
  Administered 2018-07-04: 50 mL via INTRAUTERINE
  Filled 2018-07-04: qty 50

## 2018-07-04 MED ORDER — MIDAZOLAM HCL 2 MG/2ML IJ SOLN
INTRAMUSCULAR | Status: AC | PRN
  Administered 2018-07-04: 1 mg via INTRAVENOUS

## 2018-07-04 NOTE — Plan of Care (Signed)
  Problem: Pain Managment: Goal: General experience of comfort will improve Outcome: Progressing   Problem: Education: Goal: Knowledge of General Education information will improve Outcome: Adequate for Discharge   Problem: Health Behavior/Discharge Planning: Goal: Ability to manage health-related needs will improve Outcome: Adequate for Discharge   Problem: Clinical Measurements: Goal: Ability to maintain clinical measurements within normal limits will improve Outcome: Adequate for Discharge Goal: Diagnostic test results will improve Outcome: Adequate for Discharge Goal: Respiratory complications will improve Outcome: Adequate for Discharge Goal: Cardiovascular complication will be avoided Outcome: Adequate for Discharge   Problem: Nutrition: Goal: Adequate nutrition will be maintained Outcome: Adequate for Discharge   Problem: Coping: Goal: Level of anxiety will decrease Outcome: Adequate for Discharge   Problem: Elimination: Goal: Will not experience complications related to bowel motility Outcome: Adequate for Discharge Goal: Will not experience complications related to urinary retention Outcome: Adequate for Discharge   Problem: Safety: Goal: Ability to remain free from injury will improve Outcome: Adequate for Discharge   Problem: Skin Integrity: Goal: Risk for impaired skin integrity will decrease Outcome: Adequate for Discharge

## 2018-07-04 NOTE — Progress Notes (Signed)
PT Cancellation Note  Patient Details Name: Charles Coffey MRN: [id number] DOB: February 26, 1935   Cancelled Treatment:    Reason Eval/Treat Not Completed: PT screened, no needs identified, will sign off; pt reports amb in hallway "2 laps", he is sitting on window seat working on his computer on PT arrival; no PT needs at this time   [facility] 07/04/2018, 12:16 PM

## 2018-07-04 NOTE — Progress Notes (Signed)
PROGRESS NOTE    Charles Coffey  GNO:[id number] DOB: [birth year] DOA: 06/30/2018 PCP: Charles Morale, MD    Brief Narrative:  83 y.o. male, w hypertension, hyperlipidemia, CAD s/p MI, RLS, stage 4, bladder cancer s/p resection (11/25/2017), chemo, and bilateral nephrostomy tube 2018, diverting colostomy 12/01/2017 secondary to colonic obstruction apparently presents with c/o bilateral flank pain today and therefore presented to Ed. Pt has slight dry cough.  Denies fever, chills, cp, palp, sob, n/v, blood in colostomy.  Pt presented to ED for evaluation of bilateral flank pain. CT abd with new L nephrostomy exchange and stable R nephroureteral stent. Patient was placed on observation for possible UTI  Assessment & Plan:   Principal Problem:   Lower urinary tract infectious disease Active Problems:   Anemia   Chronic kidney disease, stage 3 (HCC)   Thrombocytopenia (HCC)   Malfunction of nephrostomy tube (HCC)  Proteus UTI -UA appears consistent with UTI -Blood cx neg -Urine cx pos for multi-drug resistant proteus which is sensitive to ampicillin/sulbactam, cefazolin, rocephin, zosyn -Have narrowed abx coverage to rocephin which would cover both PNA and above UTI -L nephrostomy site noted to be erythematous, indurated with purulence  -IR was consulted with plans for Nephrostomy tube change and nephroureteral cath exchange today  Healthcare associated Pneumonia present on admission -Imaging personally reviewed. Findings of B infiltrates on CT chest consistent with PNA -Urine strep antigen negative -antibiotics have been narrowed to rocephin -Anticipate de-escalating to oral cephalosporin post-procedure  Anemia/ Thrombocytopenia -Plts trend reviewed. Overall, continuing to improve since admission -Continue to avoid heparin products -Continue to follow CBC -No sign of active bleeding  CKD stage3 -Renal function improving over time.  RLS -Patient to continue with mirapex  1.5mg  po qhs as tolerated  Charles Coffey -will continue with h2 blocker as patient tolerates  Constipation -Patient had been on continued Linzess -CT abd personally reviewed. Stool noted primarily throughout ascending colon -Very good results thus far with addition of lactulose -No further abd pain per patient -Cont with cathartics as needed to ensure regular bowel movements in the future  CAD -Cont carvedilol as tolerated -No chest pains or sob  Anxiety/Panic attack -patient noted to have panic attack this AM -Seems to have improved. PRN vistaril was ordered  DVT prophylaxis: SCd's Code Status: Full Family Communication: Pt in room, family at bedside Disposition Plan: Uncertain at this time  Consultants:   IR  Procedures:     Antimicrobials: Anti-infectives (From admission, onward)   Start     Dose/Rate Route Frequency Ordered Stop   07/04/18 1000  cefTRIAXone (ROCEPHIN) 2 g in sodium chloride 0.9 % 100 mL IVPB     2 g 200 mL/hr over 30 Minutes Intravenous Every 24 hours 07/03/18 1458     07/03/18 1000  ceFEPIme (MAXIPIME) 2 g in sodium chloride 0.9 % 100 mL IVPB  Status:  Discontinued     2 g 200 mL/hr over 30 Minutes Intravenous Every 24 hours 07/03/18 0909 07/03/18 1458   07/02/18 1800  vancomycin (VANCOCIN) 1,250 mg in sodium chloride 0.9 % 250 mL IVPB  Status:  Discontinued     1,250 mg 166.7 mL/hr over 90 Minutes Intravenous Every 48 hours 07/01/18 0511 07/02/18 1132   07/02/18 1400  vancomycin (VANCOCIN) 1,750 mg in sodium chloride 0.9 % 500 mL IVPB  Status:  Discontinued     1,750 mg 250 mL/hr over 120 Minutes Intravenous Every 48 hours 07/02/18 1132 07/03/18 1458   06/30/18 2345  vancomycin (VANCOCIN)  1,250 mg in sodium chloride 0.9 % 250 mL IVPB     1,250 mg 166.7 mL/hr over 90 Minutes Intravenous  Once 06/30/18 2330 07/01/18 0135   06/30/18 2345  ceFEPIme (MAXIPIME) 1 g in sodium chloride 0.9 % 100 mL IVPB  Status:  Discontinued     1 g 200 mL/hr over 30  Minutes Intravenous Every 12 hours 06/30/18 2330 07/03/18 0909   06/30/18 2130  cefTRIAXone (ROCEPHIN) 1 g in sodium chloride 0.9 % 100 mL IVPB  Status:  Discontinued     1 g 200 mL/hr over 30 Minutes Intravenous Every 24 hours 06/30/18 2116 06/30/18 2329   06/30/18 2130  sodium chloride 0.9 % with cefTRIAXone (ROCEPHIN) ADS Med    Note to Pharmacy:  Charles Coffey   : cabinet override      06/30/18 2130 07/01/18 0929      Subjective: Reports feeling much better since initial presentation. Eager to go home soon  Objective: Vitals:   07/03/18 2118 07/04/18 0443 07/04/18 1357 07/04/18 1645  BP: 107/65 112/69 127/71 127/81  Pulse: 73 71 85 96  Resp: 20 20 14  (!) 21  Temp: 98.6 F (37 C) 98.6 F (37 C) 98.1 F (36.7 C)   TempSrc: Oral Oral Oral   SpO2: 99% 99% 99% 96%  Weight:      Height:        Intake/Output Summary (Last 24 hours) at 07/04/2018 1653 Last data filed at 07/03/2018 2108 Gross per 24 hour  Intake 60 ml  Output -  Net 60 ml   Filed Weights   07/01/18 0509 07/02/18 0543 07/03/18 0500  Weight: 80.2 kg (176 lb 12.9 oz) 79.6 kg (175 lb 8 oz) 79.5 kg (175 lb 4.8 oz)    Examination: General exam: Conversant, in no acute distress Respiratory system: normal chest rise, clear, no audible wheezing Cardiovascular system: regular rhythm, s1-s2 Gastrointestinal system: Nondistended, nontender, pos BS Central nervous system: No seizures, no tremors Extremities: No cyanosis, no joint deformities Skin: No rashes, no pallor Psychiatry: Affect normal // no auditory hallucinations    Data Reviewed: I have personally reviewed following labs and imaging studies  CBC: Recent Labs  Lab 06/30/18 2009 07/01/18 0448 07/02/18 0424 07/03/18 0420 07/04/18 0346  WBC 5.8 5.9 5.3 6.2 3.6*  NEUTROABS 3.1  --   --   --  1.6*  HGB 8.8* 7.1* 7.4* 8.7* 7.5*  HCT 26.6* 21.1* 22.2* 25.5* 22.6*  MCV 96.7 96.3 96.9 94.8 95.8  PLT 53* 55* 56* 65* 69*   Basic Metabolic Panel: Recent  Labs  Lab 06/30/18 2009 07/01/18 0448 07/02/18 0424 07/03/18 0420 07/04/18 0346  NA 138 134* 139 140 141  K 4.0 4.1 3.8 3.2* 3.8  CL 111 109 113* 112* 115*  CO2 19* 18* 18* 16* 20*  GLUCOSE 114* 94 99 124* 95  BUN 33* 31* 24* 24* 22  CREATININE 1.73* 1.61* 1.50* 1.63* 1.45*  CALCIUM 9.5 9.1 9.2 9.8 9.4   GFR: Estimated Creatinine Clearance: 38.7 mL/min (A) (by C-G formula based on SCr of 1.45 mg/dL (H)). Liver Function Tests: Recent Labs  Lab 06/30/18 2009 07/01/18 0448  AST 15 16  ALT 15 13  ALKPHOS 48 42  BILITOT 0.4 0.9  PROT 6.6 6.0*  ALBUMIN 3.3* 3.0*   No results for input(s): LIPASE, AMYLASE in the last 168 hours. No results for input(s): AMMONIA in the last 168 hours. Coagulation Profile: Recent Labs  Lab 07/04/18 0346  INR 1.22   Cardiac  Enzymes: No results for input(s): CKTOTAL, CKMB, CKMBINDEX, TROPONINI in the last 168 hours. BNP (last 3 results) No results for input(s): PROBNP in the last 8760 hours. HbA1C: No results for input(s): HGBA1C in the last 72 hours. CBG: No results for input(s): GLUCAP in the last 168 hours. Lipid Profile: No results for input(s): CHOL, HDL, LDLCALC, TRIG, CHOLHDL, LDLDIRECT in the last 72 hours. Thyroid Function Tests: No results for input(s): TSH, T4TOTAL, FREET4, T3FREE, THYROIDAB in the last 72 hours. Anemia Panel: No results for input(s): VITAMINB12, FOLATE, FERRITIN, TIBC, IRON, RETICCTPCT in the last 72 hours. Sepsis Labs: Recent Labs  Lab 06/30/18 2146  LATICACIDVEN 0.40*    Recent Results (from the past 240 hour(s))  Urine Culture     Status: Abnormal   Collection Time: 06/30/18  7:49 PM  Result Value Ref Range Status   Specimen Description   Final    URINE, CLEAN CATCH Performed at [facility], [place] [address] [place], [place] [address]    Special Requests   Final    NONE Performed at [facility], [place] [address] [place], [place] [address]    Culture  >=100,000 COLONIES/mL PROTEUS MIRABILIS (A)  Final   Report Status 07/03/2018 FINAL  Final   Organism ID, Bacteria PROTEUS MIRABILIS (A)  Final      Susceptibility   Proteus mirabilis - MIC*    AMPICILLIN >=32 RESISTANT Resistant     CEFAZOLIN <=4 SENSITIVE Sensitive     CEFTRIAXONE <=1 SENSITIVE Sensitive     CIPROFLOXACIN >=4 RESISTANT Resistant     GENTAMICIN <=1 SENSITIVE Sensitive     IMIPENEM 8 INTERMEDIATE Intermediate     NITROFURANTOIN RESISTANT Resistant     TRIMETH/SULFA >=320 RESISTANT Resistant     AMPICILLIN/SULBACTAM 4 SENSITIVE Sensitive     PIP/TAZO <=4 SENSITIVE Sensitive     * >=100,000 COLONIES/mL PROTEUS MIRABILIS  Culture, blood (Routine X 2) w Reflex to ID Panel     Status: None (Preliminary result)   Collection Time: 06/30/18  9:50 PM  Result Value Ref Range Status   Specimen Description BLOOD PORTA CATH  Final   Special Requests   Final    AEROBIC BOTTLE ONLY BCAV Performed at [facility] [address] [place], [place] [address]    Culture   Final    NO GROWTH 3 DAYS Performed at [facility], [place] [address], [place], [place] [address]    Report Status PENDING  Incomplete  Culture, blood (Routine X 2) w Reflex to ID Panel     Status: None (Preliminary result)   Collection Time: 06/30/18  9:50 PM  Result Value Ref Range Status   Specimen Description BLOOD LEFT FOREARM  Final   Special Requests   Final    AEROBIC BOTTLE ONLY BCAV Performed at [facility], [place] [address], [place], [place] [address]    Culture   Final    NO GROWTH 3 DAYS Performed at [facility], [place] [address], [place], [place] [address]    Report Status PENDING  Incomplete  MRSA PCR Screening     Status: Abnormal   Collection Time: 07/01/18  9:54 AM  Result Value Ref Range Status   MRSA by PCR POSITIVE (A) NEGATIVE Final    Comment:        The GeneXpert MRSA Assay (FDA approved for NASAL specimens only), is one component of  a comprehensive MRSA colonization surveillance program. It is  not intended to diagnose MRSA infection nor to guide or monitor treatment for MRSA infections. RESULT CALLED TO, READ BACK BY AND VERIFIED WITH: Jethro Bastos [id number] @ [id number] Performed at [facility] [address], [place], [place] [address]      Radiology Studies: Dg Chest Port 1 View  Result Date: 07/02/2018 CLINICAL DATA:  Evaluate pneumonia. History of hypertension, coronary artery disease. EXAM: PORTABLE CHEST 1 VIEW COMPARISON:  Chest x-rays dated 06/30/2018 and 06/13/2018. FINDINGS: Stable cardiomegaly. Aortic atherosclerosis. Mild central pulmonary vascular congestion is stable, without overt alveolar pulmonary edema. No confluent opacity to suggest pneumonia on today's exam. No pleural effusion or pneumothorax seen. RIGHT chest wall Port-A-Cath is stable in position with tip at the level of the lower SVC. No acute or suspicious osseous finding. IMPRESSION: 1. No evidence of pneumonia on today's exam. 2. Mild central pulmonary vascular congestion without overt alveolar pulmonary edema. 3. Stable mild cardiomegaly. 4. Aortic atherosclerosis. Electronically Signed   By: Franki Cabot M.D.   On: 07/02/2018 18:00    Scheduled Meds: . Chlorhexidine Gluconate Cloth  6 each Topical Q0600  . diphenhydrAMINE  25 mg Oral QHS  . famotidine  20 mg Oral Daily  . feeding supplement (ENSURE ENLIVE)  237 mL Oral BID BM  . fentaNYL      . iopamidol      . lidocaine      . linaclotide  290 mcg Oral QAC breakfast  . midazolam      . mirabegron ER  25 mg Oral Daily  . mirtazapine  15 mg Oral QHS  . mupirocin ointment  1 application Nasal BID  . oxyCODONE  10 mg Oral BID  . pramipexole  1.5 mg Oral QHS  . triamcinolone cream   Topical TID   Continuous Infusions: . cefTRIAXone (ROCEPHIN)  IV Stopped (07/04/18 1100)     LOS: 2 days   Marylu Lund, MD [facility] Pager  [phone]  If 7PM-7AM, please contact night-coverage [email or web address] Password [email or web address] 07/04/2018, 4:53 PM

## 2018-07-04 NOTE — Progress Notes (Signed)
Pt discharge instructions reviewed with pt and family members at bedside. Port a cath deaccessed prior to discharge. Pt transported via wheelchair at discharge. No questions or concerns at this time.

## 2018-07-04 NOTE — Procedures (Signed)
Infection, chronic neph tubes  S/p exchg of the right nephrourteral cath and the left pcn  No comp Stable To gravity bags

## 2018-07-04 NOTE — Discharge Summary (Addendum)
Physician Discharge Summary  EIVAN GALLINA RWE:[id number] DOB: [birth year] DOA: 06/30/2018  PCP: [name], MD  Admit date: 06/30/2018 Discharge date: 07/04/2018  Admitted From: Home Disposition:  Home  Recommendations for Outpatient Follow-up:  1. Follow up with PCP in 1-2 weeks 2. Follow up with Oncology as scheduled 3. Follow up with Cardiology as scheduled  Discharge Condition:Improved CODE STATUS:Full Diet recommendation: Regular   Brief/Interim Summary: 83 y.o.male,w hypertension, hyperlipidemia, CAD s/p MI, RLS, stage 4, bladder cancer s/p resection (11/25/2017), chemo, and bilateral nephrostomy tube 2018, diverting colostomy 12/01/2017 secondary to colonic obstruction apparently presents with c/obilateral flank pain today and therefore presented to Ed. Pt has slight dry cough. Denies fever, chills, cp, palp, sob, n/v, blood in colostomy. Pt presented to ED for evaluation of bilateral flank pain. CT abd with new L nephrostomy exchange and stable R nephroureteral stent. Patient was placed on observation for possible UTI  Proteus UTI related to nephrostomy tubes -UA appears consistent with UTI -Blood cx neg -Urine cx pos for multi-drug resistant proteus which is sensitive to ampicillin/sulbactam, cefazolin, rocephin, zosyn -Have narrowed abx coverage to rocephin which would cover both PNA and above UTI -L nephrostomy site noted to be erythematous, indurated with purulence  -IR was consulted with plans for Nephrostomy tube change and nephroureteral cath exchange 7/8 -Will complete 7 more days of omnicef on discharge to complete course  Healthcare associated Pneumonia present on admission -Imaging personally reviewed. Findings of B infiltrates on CT chest consistent with PNA -Urine strep antigen negative -antibiotics have been narrowed to rocephin -Discharge on omnicef x 7 more days on discharge  Anemia/ Thrombocytopenia -Plts trend reviewed. Overall, continuing to  improve since admission -Continue to avoid heparin products -Continue to follow CBC -No sign of active bleeding  CKD stage3 -Renal function improving over time.  RLS -Patient to continue with mirapex 1.5mg  po qhs as tolerated  Jerrye Bushy -will continue with h2 blocker as patient tolerates  Constipation -Patient had been on continued Linzess -CT abd personally reviewed. Stool noted primarily throughout ascending colon -Very good results thus far with addition of lactulose -No further abd pain per patient -Cont with cathartics as needed to ensure regular bowel movements in the future  CAD -Cont carvedilol as tolerated -No chest pains or sob  Anxiety/Panic attack -patient noted to have panic attack this visit -Seems to have improved. PRN vistaril was ordered while in hospital   Discharge Diagnoses:  Principal Problem:   Lower urinary tract infectious disease Active Problems:   Anemia   Chronic kidney disease, stage 3 (HCC)   Thrombocytopenia (HCC)   Malfunction of nephrostomy tube [facility])    Discharge Instructions   Allergies as of 07/04/2018      Reactions   Antihistamines, Loratadine-type Other (See Comments)   Unable to urinate   Statins Other (See Comments)   liver effects      Medication List    STOP taking these medications   levofloxacin 500 MG tablet Commonly known as:  LEVAQUIN   traZODone 50 MG tablet Commonly known as:  DESYREL     TAKE these medications   acetaminophen 500 MG tablet Commonly known as:  TYLENOL Take 1,000 mg by mouth every 8 (eight) hours as needed for mild pain or moderate pain.   carvedilol 3.125 MG tablet Commonly known as:  COREG Take 1 tablet (3.125 mg total) by mouth 2 (two) times daily. What changed:    medication strength  how much to take  when to take  this   cefdinir 300 MG capsule Commonly known as:  OMNICEF Take 1 capsule (300 mg total) by mouth 2 (two) times daily for 7 days.   dicyclomine 10 MG  capsule Commonly known as:  BENTYL TAKE 1-2 BY MOUTH EVERY 6 HOURS AS NEEDED FOR RECTAL SPASMS.   diphenhydrAMINE 50 MG tablet Commonly known as:  BENADRYL Take 100 mg by mouth at bedtime.   feeding supplement (ENSURE ENLIVE) Liqd Take 237 mLs by mouth 2 (two) times daily between meals.   furosemide 40 MG tablet Commonly known as:  LASIX Take 1 tablet (40 mg total) by mouth 2 (two) times daily. What changed:    when to take this  reasons to take this   guaiFENesin-codeine 100-10 MG/5ML syrup Take 5 mLs by mouth every 6 (six) hours as needed for cough.   lidocaine-prilocaine cream Commonly known as:  EMLA Apply 1 application topically as needed.   LINZESS 290 MCG Caps capsule Generic drug:  linaclotide TAKE 1 CAP BY MOUTH ONCE DAILY 30 MIN PRIOR TO A MEAL. MUST HAVE OFFICE VISIT FOR FURTHER REFILLS   megestrol 40 MG/ML suspension Commonly known as:  MEGACE TAKE 10ML BY MOUTH TWICE A DAY   mirabegron ER 25 MG Tb24 tablet Commonly known as:  MYRBETRIQ Take 1 tablet (25 mg total) by mouth daily.   mirtazapine 15 MG tablet Commonly known as:  REMERON Take 1 tablet (15 mg total) by mouth at bedtime.   nitroGLYCERIN 0.4 MG SL tablet Commonly known as:  NITROSTAT Place 1 tablet (0.4 mg total) under the tongue every 5 (five) minutes as needed.   oxyCODONE 10 mg 12 hr tablet Commonly known as:  OXYCONTIN Take 1 tablet (10 mg total) by mouth 2 (two) times daily.   oxyCODONE-acetaminophen 10-325 MG tablet Commonly known as:  PERCOCET Take 1 tablet by mouth every 4 (four) hours as needed for pain.   pramipexole 1 MG tablet Commonly known as:  MIRAPEX TAKE 1 AND 1/2 TABLETS BY MOUTH AT BEDTIME   prochlorperazine 10 MG tablet Commonly known as:  COMPAZINE Take 1 tablet (10 mg total) by mouth every 6 (six) hours as needed for nausea or vomiting.   ranitidine 300 MG tablet Commonly known as:  ZANTAC TAKE 1 TABLET BY MOUTH TWICE A DAY   traMADol 50 MG tablet Commonly  known as:  ULTRAM Take 50 mg by mouth as needed for moderate pain.   triamcinolone cream 0.1 % Commonly known as:  KENALOG Apply topically 3 (three) times daily. To affected areas   VISINE MAXIMUM REDNESS RELIEF OP Place 2 drops into both eyes 2 (two) times daily as needed (dry eyes).      Follow-up Information    [name], MD. Schedule an appointment as soon as possible for a visit in 1 week(s).   Specialty:  Family Medicine Contact information: [place] [address] [phone]        [name], MD .   Specialty:  Cardiology Contact information: [address] [place] [address] [phone]          Allergies  Allergen Reactions  . Antihistamines, Loratadine-Type Other (See Comments)    Unable to urinate  . Statins Other (See Comments)    liver effects    Consultations:  IR  Procedures/Studies: Dg Chest 2 View  Result Date: 06/30/2018 CLINICAL DATA:  Cough. Body aches. Abdominal pain. Back pain. History of bladder cancer with last chemotherapy Tuesday. Cough  for couple of days. Previous smoker. History of hypertension. EXAM: CHEST - 2 VIEW COMPARISON:  06/13/2018 FINDINGS: Postoperative changes in the mediastinum. Power port type central venous catheter with tip over the cavoatrial junction region. No pneumothorax. Cardiac enlargement. Mild central pulmonary vascular congestion. No edema or consolidation in the lungs. No blunting of costophrenic angles. No pneumothorax. Calcification of the aorta. Mediastinal contours appear intact. IMPRESSION: Cardiac enlargement with mild pulmonary vascular congestion. No pulmonary edema or consolidation. Aortic atherosclerosis. Electronically Signed   By: Lucienne Capers M.D.   On: 06/30/2018 21:38   Dg Chest 2 View  Result Date: 06/13/2018 CLINICAL DATA:  One week of productive cough, congestion, weakness, and shortness of breath. History of bladder malignancy. EXAM: CHEST -  2 VIEW COMPARISON:  Chest x-ray of April 08, 2018. FINDINGS: The lungs are adequately inflated. The lung markings are slightly more conspicuous in the right middle lobe anteriorly and laterally. The left lung is clear. The heart and pulmonary vascularity are normal. There are post CABG changes. There is calcification in the wall of the aortic arch. The porta catheter tip projects over the junction of the middle and distal thirds of the SVC. There are bilateral nephrostomy tubes in the upper abdomen. IMPRESSION: Subsegmental atelectasis or early pneumonia in the right middle lobe anteriorly and laterally. Followup PA and lateral chest X-ray is recommended in 3-4 weeks following trial of antibiotic therapy to ensure resolution and exclude underlying malignancy. Electronically Signed   By: David  Martinique M.D.   On: 06/13/2018 12:32   Dg Chest Port 1 View  Result Date: 07/02/2018 CLINICAL DATA:  Evaluate pneumonia. History of hypertension, coronary artery disease. EXAM: PORTABLE CHEST 1 VIEW COMPARISON:  Chest x-rays dated 06/30/2018 and 06/13/2018. FINDINGS: Stable cardiomegaly. Aortic atherosclerosis. Mild central pulmonary vascular congestion is stable, without overt alveolar pulmonary edema. No confluent opacity to suggest pneumonia on today's exam. No pleural effusion or pneumothorax seen. RIGHT chest wall Port-A-Cath is stable in position with tip at the level of the lower SVC. No acute or suspicious osseous finding. IMPRESSION: 1. No evidence of pneumonia on today's exam. 2. Mild central pulmonary vascular congestion without overt alveolar pulmonary edema. 3. Stable mild cardiomegaly. 4. Aortic atherosclerosis. Electronically Signed   By: Franki Cabot M.D.   On: 07/02/2018 18:00   Ir Patient Eval Tech 0-60 Mins  Result Date: 06/20/2018 Chipper Oman     06/20/2018  5:18 PM Patient came in today for education of how to use foley bag with pcn tubes placed at [facility] 3 weeks ago.  He also had complaints of pain  and pulling at the insertion site of the left pcn tube.  I evaluated the tubes.  Neither tube had a stat-lock retention device on it to keep it from pulling.  As a result the sutures in the left side were tearing thru the skin and the site was bleeding minimally.  I removed the sutures on both sides and placed the statlock retention devices on to secure the tubes.  I once again advised the patient not to tuck the catheter directly down under the waste line of his pants.  This causes significant pull on the catheter.  I dressed the sites slightly lateral to help him tape in along his side to aid in the pulling.  Ct Renal Stone Study  Result Date: 06/30/2018 CLINICAL DATA:  83 year old male with flank pain, abdominal and back pain. Bladder cancer with most recent chemotherapy last week. EXAM: CT ABDOMEN AND PELVIS  WITHOUT CONTRAST TECHNIQUE: Multidetector CT imaging of the abdomen and pelvis was performed following the standard protocol without IV contrast. COMPARISON:  Restaging noncontrast CT chest abdomen and pelvis 03/10/2018 and earlier. FINDINGS: Lower chest: New tree-in-bud nodularity in both lower lobes (series 6, image 22). No lower lobe consolidation or pleural effusion. Small right middle lobe lung nodules are stable on series 6 images 3 and 7. No pericardial effusion. Stable cardiac size at the upper limits of normal. Hepatobiliary: Negative noncontrast liver and gallbladder. Pancreas: Negative. Spleen: Negative. Adrenals/Urinary Tract: Adrenal glands remain normal. Chronic partial left renal atrophy with chronic left nephrostomy tube. The nephrostomy tube has been exchanged since 03/10/2018, and the nephrostomy loop is now located in the left renal sinus. The left renal pelvis size has not significantly changed. Chronic stranding at the left ureteropelvic junction and along the course of the left ureter is stable. No left hydroureter identified. Chronic right nephroureteral stent is in place and  appears appropriately positioned. Trace gas within the nondilated right renal collecting system is noted on series 2, image 29. Stable right renal pelvis size. Stable right ureter. Diminutive, decompressed urinary bladder. Bladder wall thickening, but no discrete bladder mass is evident in the absence of contrast. Stomach/Bowel: Regressed but not resolved rectal wall thickening which may be treatment related. Decompressed rectum and sigmoid colon. Mild sigmoid diverticulosis. Decompressed left colon and splenic flexure. The patient has a chronic transverse colostomy with mucous fistula. Negative transverse and right colon aside from some retained stool. Appendix is diminutive or absent. Negative terminal ileum. No dilated small bowel. Negative stomach and duodenum. No abdominal free air, free fluid. Vascular/Lymphatic: Vascular patency is not evaluated in the absence of IV contrast. Aortoiliac calcified atherosclerosis. No acute lymphadenopathy. Occasional calcified retroperitoneal and mesenteric lymph nodes are stable. Reproductive: Stable and negative (possible small fat containing left inguinal hernia). Other: Regressed but not resolved perirectal and presacral soft tissue thickening and stranding, probably treatment related (series 2, image 74). No pelvic free fluid. Musculoskeletal: Chronic lumbar spine degeneration and prior fusion. No acute or suspicious osseous lesion identified. IMPRESSION: 1. New tree-in-bud nodularity in both lower lungs highly suspicious for acute bilateral lower lobe distal airway infection. No pleural effusion. 2. Left nephrostomy tube exchange since March. The left nephrostomy loop now projects more centrally within the renal sinus, but no acute obstruction of the left kidney is evident. Stable positioning of right nephroureteral stent with no adverse features. 3. Regressed but not resolved perirectal and presacral soft tissue thickening and stranding, probably treatment related. 4.  No new abnormality identified in the noncontrast abdomen or pelvis. 5. Aortic Atherosclerosis (ICD10-I70.0). Electronically Signed   By: Genevie Ann M.D.   On: 06/30/2018 20:58   Ir Ext Nephroureteral Cath Exchange  Result Date: 07/04/2018 INDICATION: Urinary tract infection, chronic nephrostomies EXAM: FLUOROSCOPIC EXCHANGE OF THE LEFT NEPHROSTOMY CATHETER FLUOROSCOPIC EXCHANGE OF THE RIGHT NEPHROURETERAL CATHETER COMPARISON:  04/11/2018 MEDICATIONS: NONE. ANESTHESIA/SEDATION: Fentanyl 50 mcg IV; Versed 1.0 mg IV Moderate Sedation Time:  10 MINUTES The patient was continuously monitored during the procedure by the interventional radiology nurse under my direct supervision. CONTRAST:  10 CC-administered into the collecting system(s) FLUOROSCOPY TIME:  Fluoroscopy Time: 1 minutes 48 seconds (14 mGy). COMPLICATIONS: None immediate. PROCEDURE: Informed written consent was obtained from the patient after a thorough discussion of the procedural risks, benefits and alternatives. All questions were addressed. Maximal Sterile Barrier Technique was utilized including caps, mask, sterile gowns, sterile gloves, sterile drape, hand hygiene and skin antiseptic. A  timeout was performed prior to the initiation of the procedure. left nephrostomy exchange: under sterile conditions, the existing nephrostomy catheter was injected with contrast confirming position in the renal pelvis. catheter was successfully cut and removed over a bentson guidewire. new nephrostomy advanced with the retention loop formed in the renal pelvis. contrast injection confirms position. Right nephroureteral catheter exchange: In a similar fashion, the catheter was injected confirming position under sterile conditions. Catheter was exchanged over a Bentson guidewire successfully. Distal loop formed in the bladder. Proximal loop in the renal pelvis. Images obtained for documentation. IMPRESSION: Successful left nephrostomy exchange and right nephroureteral  exchange. Electronically Signed   By: Jerilynn Mages.  Shick M.D.   On: 07/04/2018 17:22   Ir Nephrostomy Exchange Left  Result Date: 07/04/2018 INDICATION: Urinary tract infection, chronic nephrostomies EXAM: FLUOROSCOPIC EXCHANGE OF THE LEFT NEPHROSTOMY CATHETER FLUOROSCOPIC EXCHANGE OF THE RIGHT NEPHROURETERAL CATHETER COMPARISON:  04/11/2018 MEDICATIONS: NONE. ANESTHESIA/SEDATION: Fentanyl 50 mcg IV; Versed 1.0 mg IV Moderate Sedation Time:  10 MINUTES The patient was continuously monitored during the procedure by the interventional radiology nurse under my direct supervision. CONTRAST:  10 CC-administered into the collecting system(s) FLUOROSCOPY TIME:  Fluoroscopy Time: 1 minutes 48 seconds (14 mGy). COMPLICATIONS: None immediate. PROCEDURE: Informed written consent was obtained from the patient after a thorough discussion of the procedural risks, benefits and alternatives. All questions were addressed. Maximal Sterile Barrier Technique was utilized including caps, mask, sterile gowns, sterile gloves, sterile drape, hand hygiene and skin antiseptic. A timeout was performed prior to the initiation of the procedure. left nephrostomy exchange: under sterile conditions, the existing nephrostomy catheter was injected with contrast confirming position in the renal pelvis. catheter was successfully cut and removed over a bentson guidewire. new nephrostomy advanced with the retention loop formed in the renal pelvis. contrast injection confirms position. Right nephroureteral catheter exchange: In a similar fashion, the catheter was injected confirming position under sterile conditions. Catheter was exchanged over a Bentson guidewire successfully. Distal loop formed in the bladder. Proximal loop in the renal pelvis. Images obtained for documentation. IMPRESSION: Successful left nephrostomy exchange and right nephroureteral exchange. Electronically Signed   By: Jerilynn Mages.  Shick M.D.   On: 07/04/2018 17:22     Subjective: Very eager  to go home  Discharge Exam: Vitals:   07/04/18 1700 07/04/18 1705  BP: 110/68 123/73  Pulse: 91 92  Resp: 18 18  Temp:    SpO2: 100% 100%   Vitals:   07/04/18 1645 07/04/18 1655 07/04/18 1700 07/04/18 1705  BP: 127/81 125/83 110/68 123/73  Pulse: 96 93 91 92  Resp: (!) 21 19 18 18   Temp:      TempSrc:      SpO2: 96% 100% 100% 100%  Weight:      Height:        General: Pt is alert, awake, not in acute distress Cardiovascular: RRR, S1/S2 +, no rubs, no gallops Respiratory: CTA bilaterally, no wheezing, no rhonchi Abdominal: Soft, NT, ND, bowel sounds + Extremities: no edema, no cyanosis   The results of significant diagnostics from this hospitalization (including imaging, microbiology, ancillary and laboratory) are listed below for reference.     Microbiology: Recent Results (from the past 240 hour(s))  Urine Culture     Status: Abnormal   Collection Time: 06/30/18  7:49 PM  Result Value Ref Range Status   Specimen Description   Final    URINE, CLEAN CATCH Performed at [facility], [place] [address], [place], [place] [address]  Special Requests   Final    NONE Performed at [facility], [place] [address], [place], [place] [address]    Culture >=100,000 COLONIES/mL PROTEUS MIRABILIS (A)  Final   Report Status 07/03/2018 FINAL  Final   Organism ID, Bacteria PROTEUS MIRABILIS (A)  Final      Susceptibility   Proteus mirabilis - MIC*    AMPICILLIN >=32 RESISTANT Resistant     CEFAZOLIN <=4 SENSITIVE Sensitive     CEFTRIAXONE <=1 SENSITIVE Sensitive     CIPROFLOXACIN >=4 RESISTANT Resistant     GENTAMICIN <=1 SENSITIVE Sensitive     IMIPENEM 8 INTERMEDIATE Intermediate     NITROFURANTOIN RESISTANT Resistant     TRIMETH/SULFA >=320 RESISTANT Resistant     AMPICILLIN/SULBACTAM 4 SENSITIVE Sensitive     PIP/TAZO <=4 SENSITIVE Sensitive     * >=100,000 COLONIES/mL PROTEUS MIRABILIS  Culture, blood (Routine X 2) w Reflex to ID Panel      Status: None (Preliminary result)   Collection Time: 06/30/18  9:50 PM  Result Value Ref Range Status   Specimen Description BLOOD PORTA CATH  Final   Special Requests   Final    AEROBIC BOTTLE ONLY BCAV Performed at [place] [address], [place], [place] [address]    Culture   Final    NO GROWTH 3 DAYS Performed at [facility], [place] [address] [place], [place] [address]    Report Status PENDING  Incomplete  Culture, blood (Routine X 2) w Reflex to ID Panel     Status: None (Preliminary result)   Collection Time: 06/30/18  9:50 PM  Result Value Ref Range Status   Specimen Description BLOOD LEFT FOREARM  Final   Special Requests   Final    AEROBIC BOTTLE ONLY BCAV Performed at [facility], [place] [address] [place], [place] [address]    Culture   Final    NO GROWTH 3 DAYS Performed at [facility], [place] [address], [place], [place] [address]    Report Status PENDING  Incomplete  MRSA PCR Screening     Status: Abnormal   Collection Time: 07/01/18  9:54 AM  Result Value Ref Range Status   MRSA by PCR POSITIVE (A) NEGATIVE Final    Comment:        The GeneXpert MRSA Assay (FDA approved for NASAL specimens only), is one component of a comprehensive MRSA colonization surveillance program. It is not intended to diagnose MRSA infection nor to guide or monitor treatment for MRSA infections. RESULT CALLED TO, READ BACK BY AND VERIFIED WITH: Jethro Bastos [id number] @ [address] Performed at [place] [address], [place], [place] [address]      Labs: BNP (last 3 results) Recent Labs    01/31/18 0153  BNP 5,366.4*   Basic Metabolic Panel: Recent Labs  Lab 06/30/18 2009 07/01/18 0448 07/02/18 0424 07/03/18 0420 07/04/18 0346  NA 138 134* 139 140 141  K 4.0 4.1 3.8 3.2* 3.8  CL 111 109 113* 112* 115*  CO2 19* 18* 18* 16* 20*  GLUCOSE 114* 94 99 124* 95  BUN 33* 31* 24* 24*  22  CREATININE 1.73* 1.61* 1.50* 1.63* 1.45*  CALCIUM 9.5 9.1 9.2 9.8 9.4   Liver Function Tests: Recent Labs  Lab 06/30/18 2009 07/01/18 0448  AST 15 16  ALT 15 13  ALKPHOS 48 42  BILITOT 0.4 0.9  PROT 6.6 6.0*  ALBUMIN 3.3* 3.0*   No results for input(s): LIPASE, AMYLASE in the last 168 hours. No results for input(s): AMMONIA in the last 168 hours. CBC: Recent Labs  Lab 06/30/18 2009 07/01/18 0448 07/02/18 0424 07/03/18 0420 07/04/18 0346  WBC 5.8 5.9 5.3 6.2 3.6*  NEUTROABS 3.1  --   --   --  1.6*  HGB 8.8* 7.1* 7.4* 8.7* 7.5*  HCT 26.6* 21.1* 22.2* 25.5* 22.6*  MCV 96.7 96.3 96.9 94.8 95.8  PLT 53* 55* 56* 65* 69*   Cardiac Enzymes: No results for input(s): CKTOTAL, CKMB, CKMBINDEX, TROPONINI in the last 168 hours. BNP: Invalid input(s): POCBNP CBG: No results for input(s): GLUCAP in the last 168 hours. D-Dimer No results for input(s): DDIMER in the last 72 hours. Hgb A1c No results for input(s): HGBA1C in the last 72 hours. Lipid Profile No results for input(s): CHOL, HDL, LDLCALC, TRIG, CHOLHDL, LDLDIRECT in the last 72 hours. Thyroid function studies No results for input(s): TSH, T4TOTAL, T3FREE, THYROIDAB in the last 72 hours.  Invalid input(s): FREET3 Anemia work up No results for input(s): VITAMINB12, FOLATE, FERRITIN, TIBC, IRON, RETICCTPCT in the last 72 hours. Urinalysis    Component Value Date/Time   COLORURINE AMBER (A) 06/30/2018 1949   APPEARANCEUR CLEAR 06/30/2018 1949   LABSPEC 1.016 06/30/2018 1949   PHURINE 7.0 06/30/2018 1949   GLUCOSEU NEGATIVE 06/30/2018 1949   HGBUR LARGE (A) 06/30/2018 1949   HGBUR negative 10/15/2010 1251   BILIRUBINUR NEGATIVE 06/30/2018 1949   BILIRUBINUR n 12/01/2016 1521   KETONESUR NEGATIVE 06/30/2018 1949   PROTEINUR >=300 (A) 06/30/2018 1949   UROBILINOGEN 0.2 12/01/2016 1521   UROBILINOGEN 0.2 10/15/2010 1251   NITRITE NEGATIVE 06/30/2018 1949   LEUKOCYTESUR LARGE (A) 06/30/2018 1949   Sepsis  Labs Invalid input(s): PROCALCITONIN,  WBC,  LACTICIDVEN Microbiology Recent Results (from the past 240 hour(s))  Urine Culture     Status: Abnormal   Collection Time: 06/30/18  7:49 PM  Result Value Ref Range Status   Specimen Description   Final    URINE, CLEAN CATCH Performed at [facility], [place] [address], [place], [place] [address]    Special Requests   Final    NONE Performed at [facility], [place] [address] [place], [place] [address]    Culture >=100,000 COLONIES/mL PROTEUS MIRABILIS (A)  Final   Report Status 07/03/2018 FINAL  Final   Organism ID, Bacteria PROTEUS MIRABILIS (A)  Final      Susceptibility   Proteus mirabilis - MIC*    AMPICILLIN >=32 RESISTANT Resistant     CEFAZOLIN <=4 SENSITIVE Sensitive     CEFTRIAXONE <=1 SENSITIVE Sensitive     CIPROFLOXACIN >=4 RESISTANT Resistant     GENTAMICIN <=1 SENSITIVE Sensitive     IMIPENEM 8 INTERMEDIATE Intermediate     NITROFURANTOIN RESISTANT Resistant     TRIMETH/SULFA >=320 RESISTANT Resistant     AMPICILLIN/SULBACTAM 4 SENSITIVE Sensitive     PIP/TAZO <=4 SENSITIVE Sensitive     * >=100,000 COLONIES/mL PROTEUS MIRABILIS  Culture, blood (Routine X 2) w Reflex to ID Panel     Status: None (Preliminary result)   Collection Time: 06/30/18  9:50 PM  Result Value Ref Range Status   Specimen Description BLOOD PORTA CATH  Final   Special Requests   Final    AEROBIC BOTTLE ONLY BCAV Performed at [facility] [address], [place], [place] [address]    Culture   Final  NO GROWTH 3 DAYS Performed at [facility], [place] [address] [place], [place] [address]    Report Status PENDING  Incomplete  Culture, blood (Routine X 2) w Reflex to ID Panel     Status: None (Preliminary result)   Collection Time: 06/30/18  9:50 PM  Result Value Ref Range Status   Specimen Description BLOOD LEFT FOREARM  Final   Special Requests   Final    AEROBIC BOTTLE ONLY  BCAV Performed at [facility], [place] [address] [place], [place] [address]    Culture   Final    NO GROWTH 3 DAYS Performed at [facility], [place] [address], [place], [place] [address]    Report Status PENDING  Incomplete  MRSA PCR Screening     Status: Abnormal   Collection Time: 07/01/18  9:54 AM  Result Value Ref Range Status   MRSA by PCR POSITIVE (A) NEGATIVE Final    Comment:        The GeneXpert MRSA Assay (FDA approved for NASAL specimens only), is one component of a comprehensive MRSA colonization surveillance program. It is not intended to diagnose MRSA infection nor to guide or monitor treatment for MRSA infections. RESULT CALLED TO, READ BACK BY AND VERIFIED WITH: Jethro Bastos [id number] @ [address] Performed at [facility] [address] [place], [place] [address]    Time spent: 62min  SIGNED:   Marylu Lund, MD  [facility] 07/04/2018, 5:46 PM  If 7PM-7AM, please contact night-coverage [email or web address] Password [email or web address]

## 2018-07-05 ENCOUNTER — Telehealth: Payer: Self-pay

## 2018-07-05 ENCOUNTER — Other Ambulatory Visit: Payer: Self-pay

## 2018-07-05 ENCOUNTER — Encounter (HOSPITAL_COMMUNITY): Payer: Self-pay | Admitting: Emergency Medicine

## 2018-07-05 ENCOUNTER — Observation Stay (HOSPITAL_COMMUNITY): Payer: Medicare HMO

## 2018-07-05 ENCOUNTER — Observation Stay (HOSPITAL_COMMUNITY)
Admission: EM | Admit: 2018-07-05 | Discharge: 2018-07-06 | Disposition: A | Discharge status: Home / Self Care | Payer: Medicare HMO | Attending: Family Medicine | Admitting: Family Medicine

## 2018-07-05 ENCOUNTER — Emergency Department (HOSPITAL_COMMUNITY): Payer: Medicare HMO

## 2018-07-05 DIAGNOSIS — Z87891 Personal history of nicotine dependence: Secondary | ICD-10-CM | POA: Insufficient documentation

## 2018-07-05 DIAGNOSIS — K5900 Constipation, unspecified: Secondary | ICD-10-CM | POA: Insufficient documentation

## 2018-07-05 DIAGNOSIS — Y733 Surgical instruments, materials and gastroenterology and urology devices (including sutures) associated with adverse incidents: Secondary | ICD-10-CM | POA: Insufficient documentation

## 2018-07-05 DIAGNOSIS — I255 Ischemic cardiomyopathy: Secondary | ICD-10-CM | POA: Diagnosis not present

## 2018-07-05 DIAGNOSIS — D631 Anemia in chronic kidney disease: Secondary | ICD-10-CM | POA: Diagnosis not present

## 2018-07-05 DIAGNOSIS — D691 Qualitative platelet defects: Secondary | ICD-10-CM

## 2018-07-05 DIAGNOSIS — D649 Anemia, unspecified: Secondary | ICD-10-CM

## 2018-07-05 DIAGNOSIS — M545 Low back pain: Secondary | ICD-10-CM | POA: Insufficient documentation

## 2018-07-05 DIAGNOSIS — I5042 Chronic combined systolic (congestive) and diastolic (congestive) heart failure: Secondary | ICD-10-CM | POA: Insufficient documentation

## 2018-07-05 DIAGNOSIS — C679 Malignant neoplasm of bladder, unspecified: Secondary | ICD-10-CM | POA: Diagnosis not present

## 2018-07-05 DIAGNOSIS — N183 Chronic kidney disease, stage 3 unspecified: Secondary | ICD-10-CM | POA: Diagnosis present

## 2018-07-05 DIAGNOSIS — Z4682 Encounter for fitting and adjustment of non-vascular catheter: Secondary | ICD-10-CM | POA: Diagnosis not present

## 2018-07-05 DIAGNOSIS — I252 Old myocardial infarction: Secondary | ICD-10-CM | POA: Insufficient documentation

## 2018-07-05 DIAGNOSIS — K219 Gastro-esophageal reflux disease without esophagitis: Secondary | ICD-10-CM | POA: Diagnosis not present

## 2018-07-05 DIAGNOSIS — I2510 Atherosclerotic heart disease of native coronary artery without angina pectoris: Secondary | ICD-10-CM | POA: Diagnosis present

## 2018-07-05 DIAGNOSIS — Z436 Encounter for attention to other artificial openings of urinary tract: Secondary | ICD-10-CM | POA: Diagnosis not present

## 2018-07-05 DIAGNOSIS — N1330 Unspecified hydronephrosis: Secondary | ICD-10-CM

## 2018-07-05 DIAGNOSIS — E785 Hyperlipidemia, unspecified: Secondary | ICD-10-CM | POA: Diagnosis not present

## 2018-07-05 DIAGNOSIS — T83022A Displacement of nephrostomy catheter, initial encounter: Secondary | ICD-10-CM | POA: Diagnosis not present

## 2018-07-05 DIAGNOSIS — I5022 Chronic systolic (congestive) heart failure: Secondary | ICD-10-CM | POA: Diagnosis present

## 2018-07-05 DIAGNOSIS — G8929 Other chronic pain: Secondary | ICD-10-CM | POA: Insufficient documentation

## 2018-07-05 DIAGNOSIS — I130 Hypertensive heart and chronic kidney disease with heart failure and stage 1 through stage 4 chronic kidney disease, or unspecified chronic kidney disease: Secondary | ICD-10-CM | POA: Diagnosis not present

## 2018-07-05 DIAGNOSIS — I4510 Unspecified right bundle-branch block: Secondary | ICD-10-CM | POA: Diagnosis not present

## 2018-07-05 DIAGNOSIS — R339 Retention of urine, unspecified: Secondary | ICD-10-CM | POA: Diagnosis present

## 2018-07-05 DIAGNOSIS — Z9221 Personal history of antineoplastic chemotherapy: Secondary | ICD-10-CM | POA: Insufficient documentation

## 2018-07-05 DIAGNOSIS — T83092A Other mechanical complication of nephrostomy catheter, initial encounter: Secondary | ICD-10-CM | POA: Diagnosis not present

## 2018-07-05 DIAGNOSIS — K589 Irritable bowel syndrome without diarrhea: Secondary | ICD-10-CM | POA: Diagnosis not present

## 2018-07-05 DIAGNOSIS — R39198 Other difficulties with micturition: Secondary | ICD-10-CM | POA: Diagnosis not present

## 2018-07-05 DIAGNOSIS — N131 Hydronephrosis with ureteral stricture, not elsewhere classified: Secondary | ICD-10-CM | POA: Insufficient documentation

## 2018-07-05 DIAGNOSIS — I10 Essential (primary) hypertension: Secondary | ICD-10-CM | POA: Diagnosis present

## 2018-07-05 DIAGNOSIS — T8389XA Other specified complication of genitourinary prosthetic devices, implants and grafts, initial encounter: Secondary | ICD-10-CM | POA: Diagnosis not present

## 2018-07-05 DIAGNOSIS — G2581 Restless legs syndrome: Secondary | ICD-10-CM | POA: Insufficient documentation

## 2018-07-05 DIAGNOSIS — B964 Proteus (mirabilis) (morganii) as the cause of diseases classified elsewhere: Secondary | ICD-10-CM | POA: Diagnosis not present

## 2018-07-05 DIAGNOSIS — N400 Enlarged prostate without lower urinary tract symptoms: Secondary | ICD-10-CM | POA: Diagnosis not present

## 2018-07-05 HISTORY — PX: IR NEPHROSTOMY EXCHANGE RIGHT: IMG6070

## 2018-07-05 HISTORY — PX: IR NEPHROSTOMY EXCHANGE LEFT: IMG6069

## 2018-07-05 LAB — CBC WITH DIFFERENTIAL/PLATELET
Basophils Absolute: 0 10*3/uL (ref 0.0–0.1)
Basophils Relative: 0 %
EOS PCT: 13 %
Eosinophils Absolute: 0.6 10*3/uL (ref 0.0–0.7)
HEMATOCRIT: 23.8 % — AB (ref 39.0–52.0)
Hemoglobin: 8.1 g/dL — ABNORMAL LOW (ref 13.0–17.0)
Lymphocytes Relative: 34 %
Lymphs Abs: 1.6 10*3/uL (ref 0.7–4.0)
MCH: 32.5 pg (ref 26.0–34.0)
MCHC: 34 g/dL (ref 30.0–36.0)
MCV: 95.6 fL (ref 78.0–100.0)
MONO ABS: 0.3 10*3/uL (ref 0.1–1.0)
MONOS PCT: 7 %
NEUTROS ABS: 2.2 10*3/uL (ref 1.7–7.7)
Neutrophils Relative %: 46 %
Platelets: 65 10*3/uL — ABNORMAL LOW (ref 150–400)
RBC: 2.49 MIL/uL — ABNORMAL LOW (ref 4.22–5.81)
RDW: 15.4 % (ref 11.5–15.5)
WBC: 4.6 10*3/uL (ref 4.0–10.5)

## 2018-07-05 LAB — I-STAT CHEM 8, ED
BUN: 20 mg/dL (ref 8–23)
CREATININE: 1.4 mg/dL — AB (ref 0.61–1.24)
Calcium, Ion: 1.38 mmol/L (ref 1.15–1.40)
Chloride: 112 mmol/L — ABNORMAL HIGH (ref 98–111)
GLUCOSE: 99 mg/dL (ref 70–99)
HCT: 21 % — ABNORMAL LOW (ref 39.0–52.0)
HEMOGLOBIN: 7.1 g/dL — AB (ref 13.0–17.0)
Potassium: 3.9 mmol/L (ref 3.5–5.1)
Sodium: 141 mmol/L (ref 135–145)
TCO2: 18 mmol/L — AB (ref 22–32)

## 2018-07-05 MED ORDER — LINACLOTIDE 145 MCG PO CAPS
145.0000 ug | ORAL_CAPSULE | Freq: Every day | ORAL | Status: DC
  Administered 2018-07-05: 145 ug via ORAL
  Filled 2018-07-05: qty 1

## 2018-07-05 MED ORDER — DICYCLOMINE HCL 10 MG PO CAPS: 10.0000 mg | ORAL_CAPSULE | Freq: Four times a day (QID) | ORAL | Status: DC | PRN

## 2018-07-05 MED ORDER — MIRTAZAPINE 30 MG PO TABS
15.0000 mg | ORAL_TABLET | Freq: Every day | ORAL | Status: DC
  Filled 2018-07-05: qty 0.5

## 2018-07-05 MED ORDER — TRAMADOL HCL 50 MG PO TABS: 50.0000 mg | ORAL_TABLET | ORAL | Status: DC | PRN

## 2018-07-05 MED ORDER — LINACLOTIDE 145 MCG PO CAPS: 145.0000 ug | ORAL_CAPSULE | Freq: Every day | ORAL | Status: DC

## 2018-07-05 MED ORDER — LIDOCAINE HCL 1 % IJ SOLN
INTRAMUSCULAR | Status: AC | PRN
  Administered 2018-07-05: 10 mL

## 2018-07-05 MED ORDER — OXYCODONE-ACETAMINOPHEN 10-325 MG PO TABS: 1.0000 | ORAL_TABLET | ORAL | Status: DC | PRN

## 2018-07-05 MED ORDER — IOPAMIDOL (ISOVUE-300) INJECTION 61%
50.0000 mL | Freq: Once | INTRAVENOUS | Status: AC | PRN
  Administered 2018-07-05: 25 mL

## 2018-07-05 MED ORDER — ACETAMINOPHEN 325 MG PO TABS: 650.0000 mg | ORAL_TABLET | Freq: Four times a day (QID) | ORAL | Status: DC | PRN

## 2018-07-05 MED ORDER — SODIUM CHLORIDE 0.9 % IV SOLN
1.0000 g | Freq: Once | INTRAVENOUS | Status: AC
  Administered 2018-07-05: 1 g via INTRAVENOUS
  Filled 2018-07-05: qty 10

## 2018-07-05 MED ORDER — MIRABEGRON ER 25 MG PO TB24
25.0000 mg | ORAL_TABLET | Freq: Every day | ORAL | Status: DC
  Administered 2018-07-05 – 2018-07-06 (×2): 25 mg via ORAL
  Filled 2018-07-05 (×2): qty 1

## 2018-07-05 MED ORDER — ACETAMINOPHEN 650 MG RE SUPP: 650.0000 mg | Freq: Four times a day (QID) | RECTAL | Status: DC | PRN

## 2018-07-05 MED ORDER — FAMOTIDINE 20 MG PO TABS
20.0000 mg | ORAL_TABLET | Freq: Two times a day (BID) | ORAL | Status: DC
Start: 2018-07-05 — End: 2018-07-06
  Administered 2018-07-05 – 2018-07-06 (×3): 20 mg via ORAL
  Filled 2018-07-05 (×3): qty 1

## 2018-07-05 MED ORDER — NITROGLYCERIN 0.4 MG SL SUBL: 0.4000 mg | SUBLINGUAL_TABLET | SUBLINGUAL | Status: DC | PRN

## 2018-07-05 MED ORDER — OXYCODONE HCL 5 MG PO TABS
5.0000 mg | ORAL_TABLET | ORAL | Status: DC | PRN
  Administered 2018-07-05 – 2018-07-06 (×2): 5 mg via ORAL
  Filled 2018-07-05 (×2): qty 1

## 2018-07-05 MED ORDER — TRIAMCINOLONE ACETONIDE 0.1 % EX CREA
TOPICAL_CREAM | Freq: Three times a day (TID) | CUTANEOUS | Status: DC | PRN
  Administered 2018-07-05: 23:00:00 via TOPICAL
  Filled 2018-07-05: qty 15

## 2018-07-05 MED ORDER — SODIUM CHLORIDE 0.9% FLUSH
5.0000 mL | Freq: Three times a day (TID) | INTRAVENOUS | Status: DC
  Administered 2018-07-05 – 2018-07-06 (×2): 5 mL

## 2018-07-05 MED ORDER — FENTANYL CITRATE (PF) 100 MCG/2ML IJ SOLN
INTRAMUSCULAR | Status: AC
  Filled 2018-07-05: qty 2

## 2018-07-05 MED ORDER — ONDANSETRON HCL 4 MG PO TABS: 4.0000 mg | ORAL_TABLET | Freq: Four times a day (QID) | ORAL | Status: DC | PRN

## 2018-07-05 MED ORDER — SODIUM CHLORIDE 0.9 % IV SOLN
INTRAVENOUS | Status: DC
  Administered 2018-07-05 (×2): via INTRAVENOUS

## 2018-07-05 MED ORDER — PRAMIPEXOLE DIHYDROCHLORIDE 1 MG PO TABS
1.5000 mg | ORAL_TABLET | Freq: Every day | ORAL | Status: DC
  Administered 2018-07-05: 1.5 mg via ORAL
  Filled 2018-07-05: qty 2

## 2018-07-05 MED ORDER — LORAZEPAM 0.5 MG PO TABS
0.5000 mg | ORAL_TABLET | Freq: Once | ORAL | Status: AC
  Administered 2018-07-05: 0.5 mg via ORAL
  Filled 2018-07-05: qty 1

## 2018-07-05 MED ORDER — FUROSEMIDE 40 MG PO TABS: 40.0000 mg | ORAL_TABLET | ORAL | Status: DC | PRN

## 2018-07-05 MED ORDER — LIDOCAINE HCL 1 % IJ SOLN
INTRAMUSCULAR | Status: AC
  Filled 2018-07-05: qty 20

## 2018-07-05 MED ORDER — OXYCODONE HCL ER 10 MG PO T12A
10.0000 mg | EXTENDED_RELEASE_TABLET | Freq: Two times a day (BID) | ORAL | Status: DC
  Administered 2018-07-05 – 2018-07-06 (×3): 10 mg via ORAL
  Filled 2018-07-05 (×3): qty 1

## 2018-07-05 MED ORDER — LINACLOTIDE 145 MCG PO CAPS
290.0000 ug | ORAL_CAPSULE | Freq: Every day | ORAL | Status: DC
  Administered 2018-07-05 – 2018-07-06 (×2): 290 ug via ORAL
  Filled 2018-07-05 (×3): qty 2

## 2018-07-05 MED ORDER — ONDANSETRON HCL 4 MG/2ML IJ SOLN: 4.0000 mg | Freq: Four times a day (QID) | INTRAMUSCULAR | Status: DC | PRN

## 2018-07-05 MED ORDER — CEFDINIR 300 MG PO CAPS
300.0000 mg | ORAL_CAPSULE | Freq: Two times a day (BID) | ORAL | Status: DC
  Administered 2018-07-05 – 2018-07-06 (×2): 300 mg via ORAL
  Filled 2018-07-05 (×2): qty 1

## 2018-07-05 MED ORDER — OXYCODONE-ACETAMINOPHEN 5-325 MG PO TABS
1.0000 | ORAL_TABLET | ORAL | Status: DC | PRN
  Administered 2018-07-05 – 2018-07-06 (×2): 1 via ORAL
  Filled 2018-07-05 (×2): qty 1

## 2018-07-05 MED ORDER — FENTANYL CITRATE (PF) 100 MCG/2ML IJ SOLN
50.0000 ug | Freq: Once | INTRAMUSCULAR | Status: AC
  Administered 2018-07-05: 50 ug via INTRAVENOUS

## 2018-07-05 MED ORDER — CARVEDILOL 3.125 MG PO TABS
1.5600 mg | ORAL_TABLET | Freq: Two times a day (BID) | ORAL | Status: DC
  Administered 2018-07-05 – 2018-07-06 (×3): 1.56 mg via ORAL
  Filled 2018-07-05 (×4): qty 1

## 2018-07-05 MED ORDER — IOPAMIDOL (ISOVUE-300) INJECTION 61%
INTRAVENOUS | Status: AC
  Filled 2018-07-05: qty 50

## 2018-07-05 NOTE — Consult Note (Signed)
   [facility] CM Inpatient Consult   07/05/2018  Charles Coffey 1935-07-25 [id number]   Patient screened for potential [facility] Care Management services due to frequent hospitalizations and unplanned readmission risk score of 34%.  Went to bedside to speak with patient/wife. However, [name] was off the unit and wife was not present.  Will come back at a later time to discuss [facility] Management services.    Marthenia Rolling, MSN-Ed, RN,BSN [facility] Liaison [phone]

## 2018-07-05 NOTE — Consult Note (Addendum)
   [facility] CM Inpatient Consult   07/05/2018  Charles Coffey 1935/06/28 [id number]    [facility] Care Management follow up.   Went back to bedside speak with patient and wife Charles Coffey to discuss [facility] Management program services. Both are agreeable and [facility] Care Management written consent obtained.   Charles Coffey lives with his wife.  Denies issues or concerns with transportation. However, there are questionable concerns for side effects of some of his medications. Confirms Primary Care MD is Charles Coffey (Velora Heckler at [facility] listed as doing transition of care calls).  Charles Coffey (wife) states she should be contacted post hospital discharge at [phone].  Explained that [facility] Management will not interfere or replace services provided by home health. He was active with [facility] in the recent past.   Discussed referral will be made to [facility] for CHF and [facility] Pharmacist for medication review. Charles Coffey expressses appreciation of [facility] follow up.   Charles Coffey has a history of bladder cancer, HTN, CAD, UTI. Unplanned readmission risk score of 34% (extreme).    Charles Rolling, MSN-Ed, RN,BSN [facility] Liaison [phone]

## 2018-07-05 NOTE — ED Notes (Signed)
Report given to Abby, RN.

## 2018-07-05 NOTE — Progress Notes (Signed)
RN admitting and continued with going over admission questions with pt and spouse at bedside. Spouse pulled RN aside and explained after this episode last night that the pt stated "that he did not want to go on with his life" and has been acting unlike himself down in the ER. RN spoke with pt and went over suicidal screening, pt has no active thoughts of hurting self or others at this time. After further discussion with the RN, pt calmed down and is doing much better this afternoon. PT is resting comfortable in room.

## 2018-07-05 NOTE — H&P (Signed)
History and Physical    Charles Coffey:[id number] DOB: Jun 12, 1935 DOA: 07/05/2018  PCP: [name], MD   Patient coming from: Home  I have personally briefly reviewed patient's old medical records in [place]  Chief Complaint: Nephrostomy tube removal  HPI: Charles Coffey is a 83 y.o. male with medical history significant of hypertension, hyperlipidemia, CAD status post MI, restless leg syndrome, stage IV bladder cancer status post resection on 11/25/2017 status post chemotherapy and bilateral nephrostomy tubes placed in 2018, diverting colostomy on 12/01/2017 secondary to colonic obstruction, chronic kidney disease stage III, anemia/thrombocytopenia with recent admission on 06/30/2018 and discharged on 07/04/2018 after being treated for healthcare associated pneumonia and Proteus UTI with intravenous antibiotics initially and discharged on [place] and also had exchange of bilateral nephrostomy tubes on 07/04/2018 by interventional radiology presented today after nephrostomy tube removal.  Patient is currently sleepy and wakes up slightly on calling his name does not answer much questions.  Wife present at bedside states that earlier this morning patient apparently got up from bed went to the kitchen and cut his nephrostomy tube with scissors.  He then pulled the left tube out.  Wife was asleep at that time.  Patient was brought to the emergency room.  Patient's wife reports that patient has had intermittent episodes of confusion at night even prior to recent hospitalization.  Patient has trouble sleeping and is on medications for the same.  ED Course: IR was consulted who recommended that patient be admitted to medicine service and IR would exchange the tube.  Intravenous Rocephin was given in the ED.  Review of Systems: Could not be appropriately obtained because patient is currently sleepy and does not answer much questions. Past Medical History:  Diagnosis Date  . Aortic  atherosclerosis ([place])   . BPH with urinary obstruction   . CAD (coronary artery disease)    a.  MI 1995, CABG x 3 2002 (patient says that he had LIMA and RIMA grafts). b. ETT-Cardiolite (10/15) with EF 48%, apical scar, no ischemia. c. Nuc 07/2017 abnormal -> cath was performed,  patent LIMA to LAD and SVG to OM 2. RIMA to RCA is atretic. The right coronary artery is occluded distally with extensive collaterals from the LAD, medical therapy.   . Cancer [facility])    bladder cancer  . Cervical spondylosis without myelopathy 06-Dec-202017  . Chronic low back pain    sees Dr. Kary Kos   . GERD (gastroesophageal reflux disease)   . Hiatal hernia   . Hyperlipidemia   . Hypertension   . IBS (irritable bowel syndrome)   . Ischemic cardiomyopathy   . Myocardial infarction ([place])   . RBBB   . Restless legs syndrome   . Statin intolerance   . Syncope    a. in 2015 - no apparent cause, was taking sleep medicine at the time. Cardiac workup unremarkable.  . Tubular adenoma of colon     Past Surgical History:  Procedure Laterality Date  . COLONOSCOPY  10/17/2014   per Dr. Hilarie Fredrickson, tubular adenomas, repeat in 3 yrs   . COLONOSCOPY WITH PROPOFOL N/A 12/01/2017   Procedure: COLONOSCOPY WITH PROPOFOL;  Surgeon: Milus Banister, MD;  Location: [facility];  Service: Endoscopy;  Laterality: N/A;  . CYSTOSCOPY W/ RETROGRADES Left 11/25/2017   Procedure: CYSTOSCOPY WITH RETROGRADE PYELOGRAM;  Surgeon: Irine Seal, MD;  Location: [facility]S;  Service: Urology;  Laterality: Left;  . HEART BYPASS    . IR CONVERT  RIGHT NEPHROSTOMY TO NEPHROURETERAL CATH  02/04/2018  . IR EXT NEPHROURETERAL CATH EXCHANGE  04/11/2018  . IR EXT NEPHROURETERAL CATH EXCHANGE  07/04/2018  . IR FLUORO GUIDE CV LINE RIGHT  12/27/2017  . IR NEPHROSTOGRAM LEFT THRU EXISTING ACCESS  12/24/2017  . IR NEPHROSTOMY EXCHANGE LEFT  01/28/2018  . IR NEPHROSTOMY EXCHANGE LEFT  02/04/2018  . IR NEPHROSTOMY EXCHANGE LEFT  02/25/2018  . IR NEPHROSTOMY EXCHANGE  LEFT  07/04/2018  . IR NEPHROSTOMY EXCHANGE RIGHT  12/24/2017  . IR NEPHROSTOMY EXCHANGE RIGHT  01/28/2018  . IR NEPHROSTOMY EXCHANGE RIGHT  04/11/2018  . IR NEPHROSTOMY PLACEMENT LEFT  11/28/2017  . IR NEPHROSTOMY PLACEMENT RIGHT  12/03/2017  . IR PATIENT EVAL TECH 0-60 MINS  03/24/2018  . IR PATIENT EVAL TECH 0-60 MINS  03/28/2018  . IR PATIENT EVAL TECH 0-60 MINS  04/25/2018  . IR PATIENT EVAL TECH 0-60 MINS  06/20/2018  . IR US GUIDE VASC ACCESS RIGHT  12/27/2017  . LAPAROSCOPY N/A 12/01/2017   Procedure: LAPAROSCOPIC DIVERTING OSTOMY;  Surgeon: Stark Klein, MD;  Location: [facility];  Service: General;  Laterality: N/A;  . LEFT HEART CATH AND CORS/GRAFTS ANGIOGRAPHY N/A 08/25/2017   Procedure: LEFT HEART CATH AND CORS/GRAFTS ANGIOGRAPHY;  Surgeon: Wellington Hampshire, MD;  Location: [facility];  Service: Cardiovascular;  Laterality: N/A;  . LUMBAR FUSION  2003   L3-L4  . PROSTATE SURGERY  06-27-12   per Dr. Roni Bread, had CTT  . TONSILLECTOMY    . TRANSURETHRAL RESECTION OF BLADDER TUMOR N/A 11/25/2017   Procedure: TRANSURETHRAL RESECTION OF BLADDER TUMOR (TURBT);  Surgeon: Irine Seal, MD;  Location: [facility];  Service: Urology;  Laterality: N/A;   Social history  reports that he quit smoking about 39 years ago. His smoking use included cigarettes. He has never used smokeless tobacco. He reports that he drinks about 1.2 oz of alcohol per week. He reports that he does not use drugs.  Allergies  Allergen Reactions  . Antihistamines, Loratadine-Type Other (See Comments)    Unable to urinate  . Statins Other (See Comments)    liver effects    Family History  Problem Relation Age of Onset  . Heart attack Mother   . Aneurysm Father        femoral artery  . Heart disease Brother   . Heart disease Maternal Uncle        x 2  . Colon cancer Neg Hx   . Esophageal cancer Neg Hx   . Pancreatic cancer Neg Hx   . Kidney disease Neg Hx   . Liver disease Neg Hx     Prior to Admission medications     Medication Sig Start Date End Date Taking? Authorizing Provider  acetaminophen (TYLENOL) 500 MG tablet Take 1,000 mg by mouth every 8 (eight) hours as needed for mild pain or moderate pain.   Yes [provider]  carvedilol (COREG) 3.125 MG tablet Take 1 tablet (3.125 mg total) by mouth 2 (two) times daily. Patient taking differently: Take 1.56 mg by mouth 2 (two) times daily.  07/04/18 08/03/18 Yes Donne Hazel, MD  diphenhydrAMINE (BENADRYL) 50 MG tablet Take 100 mg by mouth at bedtime.   Yes [provider]  furosemide (LASIX) 40 MG tablet Take 1 tablet (40 mg total) by mouth 2 (two) times daily. Patient taking differently: Take 40 mg by mouth as needed for fluid or edema.  05/13/18  Yes End, Harrell Gave, MD  lidocaine-prilocaine (EMLA) cream  Apply 1 application topically as needed. 12/15/17  Yes Shadad, Mathis Dad, MD  LINZESS 290 MCG CAPS capsule TAKE 1 CAP BY MOUTH ONCE DAILY 30 MIN PRIOR TO A MEAL. MUST HAVE OFFICE VISIT FOR FURTHER REFILLS 05/12/18  Yes Pyrtle, Lajuan Lines, MD  megestrol (MEGACE) 40 MG/ML suspension TAKE 10ML BY MOUTH TWICE A DAY 05/13/18  Yes Shadad, Mathis Dad, MD  mirabegron ER (MYRBETRIQ) 25 MG TB24 tablet Take 1 tablet (25 mg total) by mouth daily. 02/04/18  Yes Short, Noah Delaine, MD  mirtazapine (REMERON) 15 MG tablet Take 1 tablet (15 mg total) by mouth at bedtime. 05/30/18  Yes Tanner, Lyndon Code., PA-C  mupirocin ointment (BACTROBAN) 2 % Place 1 application into the nose 2 (two) times daily.   Yes [provider]  oxyCODONE (OXYCONTIN) 10 mg 12 hr tablet Take 1 tablet (10 mg total) by mouth 2 (two) times daily. 06/02/18  Yes Wyatt Portela, MD  oxyCODONE-acetaminophen (PERCOCET) 10-325 MG tablet Take 1 tablet by mouth every 4 (four) hours as needed for pain. 05/16/18  Yes Wyatt Portela, MD  pramipexole (MIRAPEX) 1 MG tablet TAKE 1 AND 1/2 TABLETS BY MOUTH AT BEDTIME 06/29/18  Yes [name], MD  prochlorperazine (COMPAZINE) 10 MG tablet Take 1 tablet (10 mg  total) by mouth every 6 (six) hours as needed for nausea or vomiting. 12/15/17  Yes Wyatt Portela, MD  ranitidine (ZANTAC) 300 MG tablet TAKE 1 TABLET BY MOUTH TWICE A DAY 02/10/18  Yes Pyrtle, Lajuan Lines, MD  Tetrahydroz-Glyc-Hyprom-PEG (VISINE MAXIMUM REDNESS RELIEF OP) Place 2 drops into both eyes 2 (two) times daily as needed (dry eyes).    Yes [provider]  traMADol (ULTRAM) 50 MG tablet Take 50 mg by mouth as needed for moderate pain.   Yes [provider]  triamcinolone cream (KENALOG) 0.1 % Apply topically 3 (three) times daily. To affected areas 07/04/18  Yes Donne Hazel, MD  cefdinir (OMNICEF) 300 MG capsule Take 1 capsule (300 mg total) by mouth 2 (two) times daily for 7 days. 07/04/18 07/11/18  Donne Hazel, MD  dicyclomine (BENTYL) 10 MG capsule TAKE 1-2 BY MOUTH EVERY 6 HOURS AS NEEDED FOR RECTAL SPASMS. Patient not taking: Reported on 07/05/2018 03/07/18   Jerene Bears, MD  feeding supplement, ENSURE ENLIVE, (ENSURE ENLIVE) LIQD Take 237 mLs by mouth 2 (two) times daily between meals. Patient not taking: Reported on 07/05/2018 02/03/18   Janece Canterbury, MD  guaiFENesin-codeine 100-10 MG/5ML syrup Take 5 mLs by mouth every 6 (six) hours as needed for cough. Patient not taking: Reported on 06/30/2018 06/13/18   Harle Stanford., PA-C  nitroGLYCERIN (NITROSTAT) 0.4 MG SL tablet Place 1 tablet (0.4 mg total) under the tongue every 5 (five) minutes as needed. 08/19/17 06/30/18  Charlie Pitter, PA-C    Physical Exam: Vitals:   07/05/18 0641 07/05/18 0700 07/05/18 0730 07/05/18 0800  BP: 117/69 (!) 121/94 (!) 102/56 113/63  Pulse: 89 81 73 79  Resp: 12  13 14   Temp:      TempSrc:      SpO2: 100% 100% 99% 100%  Weight:      Height:        Constitutional: Elderly male lying in bed no acute distress , calm, comfortable Vitals:   07/05/18 0641 07/05/18 0700 07/05/18 0730 07/05/18 0800  BP: 117/69 (!) 121/94 (!) 102/56 113/63  Pulse: 89 81 73 79  Resp: 12  13 14   Temp:  TempSrc:      SpO2: 100% 100% 99% 100%  Weight:      Height:       Eyes: PERRL, lids and conjunctivae normal ENMT: Mucous membranes are moist. Posterior pharynx clear of any exudate or lesions. Neck: normal, supple, no masses, no thyromegaly Respiratory: bilateral decreased breath sounds at bases, no wheezing, no crackles. Normal respiratory effort. No accessory muscle use.  Cardiovascular: S1 S2 positive, rate controlled. No extremity edema. 2+ pedal pulses.  Abdomen: no tenderness, no masses palpated. No hepatosplenomegaly. Bowel sounds positive.  Genitourinary: Bilateral flank dressing present.  No CVA tenderness Musculoskeletal: no clubbing / cyanosis. No joint deformity upper and lower extremities.  Skin: no rashes, lesions, ulcers. No induration Neurologic: CN 2-12 grossly intact. Moving extremities. No focal neurologic deficits.  Sleepy, wakes up slightly on calling his name but does not answer much questions Psychiatric: Could not be assessed because of patient's current mental status    Labs on Admission: I have personally reviewed following labs and imaging studies  CBC: Recent Labs  Lab 06/30/18 2009 07/01/18 0448 07/02/18 0424 07/03/18 0420 07/04/18 0346 07/05/18 0610 07/05/18 0617  WBC 5.8 5.9 5.3 6.2 3.6* 4.6  --   NEUTROABS 3.1  --   --   --  1.6* 2.2  --   HGB 8.8* 7.1* 7.4* 8.7* 7.5* 8.1* 7.1*  HCT 26.6* 21.1* 22.2* 25.5* 22.6* 23.8* 21.0*  MCV 96.7 96.3 96.9 94.8 95.8 95.6  --   PLT 53* 55* 56* 65* 69* 65*  --    Basic Metabolic Panel: Recent Labs  Lab 06/30/18 2009 07/01/18 0448 07/02/18 0424 07/03/18 0420 07/04/18 0346 07/05/18 0617  NA 138 134* 139 140 141 141  K 4.0 4.1 3.8 3.2* 3.8 3.9  CL 111 109 113* 112* 115* 112*  CO2 19* 18* 18* 16* 20*  --   GLUCOSE 114* 94 99 124* 95 99  BUN 33* 31* 24* 24* 22 20  CREATININE 1.73* 1.61* 1.50* 1.63* 1.45* 1.40*  CALCIUM 9.5 9.1 9.2 9.8 9.4  --    GFR: Estimated Creatinine Clearance: 39.4 mL/min  (A) (by C-G formula based on SCr of 1.4 mg/dL (H)). Liver Function Tests: Recent Labs  Lab 06/30/18 2009 07/01/18 0448  AST 15 16  ALT 15 13  ALKPHOS 48 42  BILITOT 0.4 0.9  PROT 6.6 6.0*  ALBUMIN 3.3* 3.0*   No results for input(s): LIPASE, AMYLASE in the last 168 hours. No results for input(s): AMMONIA in the last 168 hours. Coagulation Profile: Recent Labs  Lab 07/04/18 0346  INR 1.22   Cardiac Enzymes: No results for input(s): CKTOTAL, CKMB, CKMBINDEX, TROPONINI in the last 168 hours. BNP (last 3 results) No results for input(s): PROBNP in the last 8760 hours. HbA1C: No results for input(s): HGBA1C in the last 72 hours. CBG: No results for input(s): GLUCAP in the last 168 hours. Lipid Profile: No results for input(s): CHOL, HDL, LDLCALC, TRIG, CHOLHDL, LDLDIRECT in the last 72 hours. Thyroid Function Tests: No results for input(s): TSH, T4TOTAL, FREET4, T3FREE, THYROIDAB in the last 72 hours. Anemia Panel: No results for input(s): VITAMINB12, FOLATE, FERRITIN, TIBC, IRON, RETICCTPCT in the last 72 hours. Urine analysis:    Component Value Date/Time   COLORURINE AMBER (A) 06/30/2018 1949   APPEARANCEUR CLEAR 06/30/2018 1949   LABSPEC 1.016 06/30/2018 1949   PHURINE 7.0 06/30/2018 1949   GLUCOSEU NEGATIVE 06/30/2018 1949   HGBUR LARGE (A) 06/30/2018 1949   HGBUR negative 10/15/2010 1251  BILIRUBINUR NEGATIVE 06/30/2018 1949   BILIRUBINUR n 12/01/2016 1521   KETONESUR NEGATIVE 06/30/2018 1949   PROTEINUR >=300 (A) 06/30/2018 1949   UROBILINOGEN 0.2 12/01/2016 1521   UROBILINOGEN 0.2 10/15/2010 1251   NITRITE NEGATIVE 06/30/2018 1949   LEUKOCYTESUR LARGE (A) 06/30/2018 1949    Radiological Exams on Admission: Ct Renal Stone Study  Result Date: 07/05/2018 CLINICAL DATA:  Bilateral nephrostomy tubes pulled out. EXAM: CT ABDOMEN AND PELVIS WITHOUT CONTRAST TECHNIQUE: Multidetector CT imaging of the abdomen and pelvis was performed following the standard protocol  without IV contrast. COMPARISON:  CT scan of June 30, 2018 and March 10, 2018. FINDINGS: Lower chest: 8 mm nodule is noted in left posterior costophrenic sulcus which is not significant changed compared to prior exam. No other abnormality seen in visualized lung bases. Hepatobiliary: No focal liver abnormality is seen. No gallstones, gallbladder wall thickening, or biliary dilatation. Pancreas: Unremarkable. No pancreatic ductal dilatation or surrounding inflammatory changes. Spleen: Normal in size without focal abnormality. Adrenals/Urinary Tract: Adrenal glands appear normal. Stable position of right-sided nephroureteral stent is noted with distal tip within the urinary bladder. Left nephrostomy catheter noted on prior exam is no longer present and has been withdrawn. Mildly dilated left renal pelvis is noted. Left renal atrophy is again noted. No significant left ureteral dilatation is noted. Stomach/Bowel: The stomach and appendix are unremarkable. Stable appearance of transverse colostomy is noted. There is no evidence of bowel obstruction or inflammation. Vascular/Lymphatic: Aortic atherosclerosis. No enlarged abdominal or pelvic lymph nodes. Reproductive: Stable mild prostatic enlargement. Other: No abdominal wall hernia or abnormality. No abdominopelvic ascites. Musculoskeletal: Mild grade 1 anterolisthesis of L5-S1 is noted secondary to bilateral L5 pars defects. No acute osseous abnormality is noted. IMPRESSION: Stable 8 mm nodule is noted in left posterior costophrenic sulcus. Stable position of right-sided nephroureteral stent is noted with distal tip within the urinary bladder. Left nephrostomy catheter noted on prior exam is no longer present and has been withdrawn. Mildly dilated left renal pelvis is noted. Stable appearance of transverse colostomy. Stable mild prostatic enlargement. Aortic Atherosclerosis (ICD10-I70.0). Electronically Signed   By: Marijo Conception, M.D.   On: 07/05/2018 07:38   Ir  Ext Nephroureteral Cath Exchange  Result Date: 07/04/2018 INDICATION: Urinary tract infection, chronic nephrostomies EXAM: FLUOROSCOPIC EXCHANGE OF THE LEFT NEPHROSTOMY CATHETER FLUOROSCOPIC EXCHANGE OF THE RIGHT NEPHROURETERAL CATHETER COMPARISON:  04/11/2018 MEDICATIONS: NONE. ANESTHESIA/SEDATION: Fentanyl 50 mcg IV; Versed 1.0 mg IV Moderate Sedation Time:  10 MINUTES The patient was continuously monitored during the procedure by the interventional radiology nurse under my direct supervision. CONTRAST:  10 CC-administered into the collecting system(s) FLUOROSCOPY TIME:  Fluoroscopy Time: 1 minutes 48 seconds (14 mGy). COMPLICATIONS: None immediate. PROCEDURE: Informed written consent was obtained from the patient after a thorough discussion of the procedural risks, benefits and alternatives. All questions were addressed. Maximal Sterile Barrier Technique was utilized including caps, mask, sterile gowns, sterile gloves, sterile drape, hand hygiene and skin antiseptic. A timeout was performed prior to the initiation of the procedure. left nephrostomy exchange: under sterile conditions, the existing nephrostomy catheter was injected with contrast confirming position in the renal pelvis. catheter was successfully cut and removed over a bentson guidewire. new nephrostomy advanced with the retention loop formed in the renal pelvis. contrast injection confirms position. Right nephroureteral catheter exchange: In a similar fashion, the catheter was injected confirming position under sterile conditions. Catheter was exchanged over a Bentson guidewire successfully. Distal loop formed in the bladder. Proximal loop in the  renal pelvis. Images obtained for documentation. IMPRESSION: Successful left nephrostomy exchange and right nephroureteral exchange. Electronically Signed   By: Jerilynn Mages.  Shick M.D.   On: 07/04/2018 17:22   Ir Nephrostomy Exchange Left  Result Date: 07/04/2018 INDICATION: Urinary tract infection, chronic  nephrostomies EXAM: FLUOROSCOPIC EXCHANGE OF THE LEFT NEPHROSTOMY CATHETER FLUOROSCOPIC EXCHANGE OF THE RIGHT NEPHROURETERAL CATHETER COMPARISON:  04/11/2018 MEDICATIONS: NONE. ANESTHESIA/SEDATION: Fentanyl 50 mcg IV; Versed 1.0 mg IV Moderate Sedation Time:  10 MINUTES The patient was continuously monitored during the procedure by the interventional radiology nurse under my direct supervision. CONTRAST:  10 CC-administered into the collecting system(s) FLUOROSCOPY TIME:  Fluoroscopy Time: 1 minutes 48 seconds (14 mGy). COMPLICATIONS: None immediate. PROCEDURE: Informed written consent was obtained from the patient after a thorough discussion of the procedural risks, benefits and alternatives. All questions were addressed. Maximal Sterile Barrier Technique was utilized including caps, mask, sterile gowns, sterile gloves, sterile drape, hand hygiene and skin antiseptic. A timeout was performed prior to the initiation of the procedure. left nephrostomy exchange: under sterile conditions, the existing nephrostomy catheter was injected with contrast confirming position in the renal pelvis. catheter was successfully cut and removed over a bentson guidewire. new nephrostomy advanced with the retention loop formed in the renal pelvis. contrast injection confirms position. Right nephroureteral catheter exchange: In a similar fashion, the catheter was injected confirming position under sterile conditions. Catheter was exchanged over a Bentson guidewire successfully. Distal loop formed in the bladder. Proximal loop in the renal pelvis. Images obtained for documentation. IMPRESSION: Successful left nephrostomy exchange and right nephroureteral exchange. Electronically Signed   By: Jerilynn Mages.  Shick M.D.   On: 07/04/2018 17:22    Assessment/Plan Active Problems:   Essential hypertension   Coronary artery disease involving native heart without angina pectoris   Anemia   Chronic kidney disease, stage 3 (HCC)   Hydronephrosis due  to obstructive malignant neoplasm of bladder (HCC)   Urothelial carcinoma of bladder (HCC)   Thrombocytopathia (HCC)   Chronic systolic heart failure (HCC)   Displacement of nephrostomy tube ([place])   Nephrostomy tube displaced ([place])  Nephrostomy tube removal in a patient with bilateral nephrostomy tubes secondary to bladder cancer -IR has been consulted.  N.p.o. for now.  Recent Proteus UTI -Patient was discharged on 07/04/2018 on [place] for 7 more days.  Will continue the same.  Currently afebrile with no leukocytosis, patient already got a dose of Rocephin in the ED.  Delirium causing removal of nephrostomy tube -Patient's wife states that patient has intermittent episodes of confusion at night even prior to recent hospitalization.  Patient has been evaluated by neurology as an outpatient.  Will hold off on nighttime Benadryl dose.   -Monitor mental status.  Fall precautions. -Mental status has much improved this a.m. as per the patient's wife  Chronic anemia/thrombocytopenia -Probably secondary to cancer.  Monitor.  Transfuse packed red cells if hemoglobin is less than 7.  Currently no evidence of any bleeding  Chronic kidney disease stage III -Creatinine stable.  Monitor  Restless leg syndrome -Continue Mirapex  Constipation -Continue current bowel regimen  GERD -Continue H2 blocker  CAD -Continue Coreg.  Currently no chest pains   DVT prophylaxis: SCDs.  Avoid Lovenox because of thrombocytopenia Code Status: DNR Family Communication: Wife at bedside Disposition Plan: Probably home tomorrow if patient remains clinically stable Consults called: IR called by ED Admission status: Observation in MedSurg  Severity of Illness: The appropriate patient status for this patient is OBSERVATION. Observation status is  judged to be reasonable and necessary in order to provide the required intensity of service to ensure the patient's safety. The patient's presenting symptoms, physical  exam findings, and initial radiographic and laboratory data in the context of their medical condition is felt to place them at decreased risk for further clinical deterioration. Furthermore, it is anticipated that the patient will be medically stable for discharge from the hospital within 2 midnights of admission. The following factors support the patient status of observation.   " The patient's presenting symptoms include nephrostomy tube removal. " The physical exam findings include bilateral flank dressing. " The initial radiographic and laboratory data are CKD stage III/anemia/thrombocytopenia.      Aline August MD [facility] Pager [phone]  If 7PM-7AM, please contact night-coverage [email or web address] Password [email or web address]  07/05/2018, 8:25 AM

## 2018-07-05 NOTE — Procedures (Signed)
Pre Procedure Dx: Hydronephrosis Post Procedure Dx: Same  Successful fluoroscopic guided conversion of right-sided nephroureteral catheter to a 10 French percutaneous nephrostomy catheter with end coiled and locked within the right renal pelvis.   Successful fluoroscopic guided recanalization of left-sided nephrostomy catheter track ultimately allowing placement of a new 12 French percutaneous nephrostomy catheter with end coiled and locked within left renal pelvis.   Both PCNs connected to gravity bags.  EBL: None   No immediate complications.   [name], MD Pager #: [phone]

## 2018-07-05 NOTE — ED Triage Notes (Signed)
Pt wife reports that pt has recently had nephrostomy tubes placed and then during the night the pt removed the nephrostomy tubes. Pt was recently discharged from the hospital due to infections.

## 2018-07-05 NOTE — Telephone Encounter (Signed)
Pt is currently hospitalized, and pt wife, [name], sent MyChart message regarding CT scan. Pt had CT scan last Thursday (06/30/18) and is also scheduled for one this Thursday (07/07/18). Per Dr. [name], pt should proceed with CT scan on Thursday as it is of the chest, and the last scan did not include this area, which will help determine how to proceed. Reply sent via [place].

## 2018-07-05 NOTE — Progress Notes (Signed)
PCP was notified r/t patient/spouse  stating that they think patient's  panic attcks are r/t Mirtazapine. RN notified the PCP.

## 2018-07-05 NOTE — ED Notes (Signed)
ED TO INPATIENT HANDOFF REPORT  Name/Age/Gender Charles Coffey 83 y.o. male  Code Status    Code Status Orders  (From admission, onward)        Start     Ordered   07/05/18 0823  Do not attempt resuscitation (DNR)  Continuous    Question Answer Comment  In the event of cardiac or respiratory ARREST Do not call a "code blue"   In the event of cardiac or respiratory ARREST Do not perform Intubation, CPR, defibrillation or ACLS   In the event of cardiac or respiratory ARREST Use medication by any route, position, wound care, and other measures to relive pain and suffering. May use oxygen, suction and manual treatment of airway obstruction as needed for comfort.      07/05/18 0824    Code Status History    Date Active Date Inactive Code Status Order ID Comments User Context   06/30/2018 2318 07/04/2018 2143 Full Code [id number]  Jani Gravel, MD Inpatient   02/17/2018 0045 02/17/2018 2003 Full Code [id number]  Etta Quill, DO ED   01/31/2018 0911 02/05/2018 1516 Full Code [id number]  Phillips Grout, MD Inpatient   11/28/2017 2136 12/04/2017 2050 Full Code [id number]  Eugenie Filler, MD ED   11/25/2017 1510 11/26/2017 1608 Full Code [id number]  Irine Seal, MD Inpatient   08/25/2017 1845 08/26/2017 1906 Full Code [id number]  Wellington Hampshire, MD Inpatient      Home/SNF/Other Home  Chief Complaint Cancer pt; Pulled Tubes out from surgery  Level of Care/Admitting Diagnosis ED Disposition    ED Disposition Condition [place]: [facility] [[id number]]  Level of Care: Med-Surg [16]  Diagnosis: Nephrostomy tube displaced [facility]) [[id number]]  Admitting Physician: Aline August [[id number]]  Attending Physician: Aline August [[id number]]  PT Class (Do Not Modify): Observation [104]  PT Acc Code (Do Not Modify): Observation [10022]       Medical History Past Medical History:  Diagnosis Date  . Aortic atherosclerosis ([place])   . BPH with urinary  obstruction   . CAD (coronary artery disease)    a.  MI 1995, CABG x 3 2002 (patient says that he had LIMA and RIMA grafts). b. ETT-Cardiolite (10/15) with EF 48%, apical scar, no ischemia. c. Nuc 07/2017 abnormal -> cath was performed,  patent LIMA to LAD and SVG to OM 2. RIMA to RCA is atretic. The right coronary artery is occluded distally with extensive collaterals from the LAD, medical therapy.   . [facility])    bladder cancer  . Cervical spondylosis without myelopathy 2020-03-516  . Chronic low back pain    sees Dr. Kary Kos   . GERD (gastroesophageal reflux disease)   . Hiatal hernia   . Hyperlipidemia   . Hypertension   . IBS (irritable bowel syndrome)   . Ischemic cardiomyopathy   . Myocardial infarction ([place])   . RBBB   . Restless legs syndrome   . Statin intolerance   . Syncope    a. in 2015 - no apparent cause, was taking sleep medicine at the time. Cardiac workup unremarkable.  . Tubular adenoma of colon     Allergies Allergies  Allergen Reactions  . Antihistamines, Loratadine-Type Other (See Comments)    Unable to urinate  . Statins Other (See Comments)    liver effects    IV Location/Drains/Wounds Patient Lines/Drains/Airways Status   Active Line/Drains/Airways    Name:   Placement date:   Placement  time:   Site:   Days:   Implanted Port 12/27/17 Right Chest   12/27/17    1630    Chest   190   Peripheral IV 07/05/18 Left Forearm   07/05/18    0604    Forearm   less than 1   Colostomy LLQ   -    -    LLQ      Urethral Catheter Dr. Alyson Ingles Coude 18 Fr.   02/03/18    -    Coude   152   Ureteral Drain/Stent Right ureter 10.2 Fr.   07/04/18    1708    Right ureter   1          Labs/Imaging Results for orders placed or performed during the hospital encounter of 07/05/18 (from the past 48 hour(s))  CBC with Differential/Platelet     Status: Abnormal   Collection Time: 07/05/18  6:10 AM  Result Value Ref Range   WBC 4.6 4.0 - 10.5 K/uL   RBC 2.49 (L) 4.22 -  5.81 MIL/uL   Hemoglobin 8.1 (L) 13.0 - 17.0 g/dL   HCT 23.8 (L) 39.0 - 52.0 %   MCV 95.6 78.0 - 100.0 fL   MCH 32.5 26.0 - 34.0 pg   MCHC 34.0 30.0 - 36.0 g/dL   RDW 15.4 11.5 - 15.5 %   Platelets 65 (L) 150 - 400 K/uL    Comment: REPEATED TO VERIFY SPECIMEN CHECKED FOR CLOTS PLATELET COUNT CONFIRMED BY SMEAR    Neutrophils Relative % 46 %   Neutro Abs 2.2 1.7 - 7.7 K/uL   Lymphocytes Relative 34 %   Lymphs Abs 1.6 0.7 - 4.0 K/uL   Monocytes Relative 7 %   Monocytes Absolute 0.3 0.1 - 1.0 K/uL   Eosinophils Relative 13 %   Eosinophils Absolute 0.6 0.0 - 0.7 K/uL   Basophils Relative 0 %   Basophils Absolute 0.0 0.0 - 0.1 K/uL    Comment: Performed at [facility], [place] [address], [place], [place] [address]  I-Stat Chem 8, ED     Status: Abnormal   Collection Time: 07/05/18  6:17 AM  Result Value Ref Range   Sodium 141 135 - 145 mmol/L   Potassium 3.9 3.5 - 5.1 mmol/L   Chloride 112 (H) 98 - 111 mmol/L   BUN 20 8 - 23 mg/dL   Creatinine, Ser 1.40 (H) 0.61 - 1.24 mg/dL   Glucose, Bld 99 70 - 99 mg/dL   Calcium, Ion 1.38 1.15 - 1.40 mmol/L   TCO2 18 (L) 22 - 32 mmol/L   Hemoglobin 7.1 (L) 13.0 - 17.0 g/dL   HCT 21.0 (L) 39.0 - 52.0 %   Ct Renal Stone Study  Result Date: 07/05/2018 CLINICAL DATA:  Bilateral nephrostomy tubes pulled out. EXAM: CT ABDOMEN AND PELVIS WITHOUT CONTRAST TECHNIQUE: Multidetector CT imaging of the abdomen and pelvis was performed following the standard protocol without IV contrast. COMPARISON:  CT scan of June 30, 2018 and March 10, 2018. FINDINGS: Lower chest: 8 mm nodule is noted in left posterior costophrenic sulcus which is not significant changed compared to prior exam. No other abnormality seen in visualized lung bases. Hepatobiliary: No focal liver abnormality is seen. No gallstones, gallbladder wall thickening, or biliary dilatation. Pancreas: Unremarkable. No pancreatic ductal dilatation or surrounding inflammatory changes.  Spleen: Normal in size without focal abnormality. Adrenals/Urinary Tract: Adrenal glands appear normal. Stable position of right-sided nephroureteral stent is noted with distal tip within  the urinary bladder. Left nephrostomy catheter noted on prior exam is no longer present and has been withdrawn. Mildly dilated left renal pelvis is noted. Left renal atrophy is again noted. No significant left ureteral dilatation is noted. Stomach/Bowel: The stomach and appendix are unremarkable. Stable appearance of transverse colostomy is noted. There is no evidence of bowel obstruction or inflammation. Vascular/Lymphatic: Aortic atherosclerosis. No enlarged abdominal or pelvic lymph nodes. Reproductive: Stable mild prostatic enlargement. Other: No abdominal wall hernia or abnormality. No abdominopelvic ascites. Musculoskeletal: Mild grade 1 anterolisthesis of L5-S1 is noted secondary to bilateral L5 pars defects. No acute osseous abnormality is noted. IMPRESSION: Stable 8 mm nodule is noted in left posterior costophrenic sulcus. Stable position of right-sided nephroureteral stent is noted with distal tip within the urinary bladder. Left nephrostomy catheter noted on prior exam is no longer present and has been withdrawn. Mildly dilated left renal pelvis is noted. Stable appearance of transverse colostomy. Stable mild prostatic enlargement. Aortic Atherosclerosis (ICD10-I70.0). Electronically Signed   By: Marijo Conception, M.D.   On: 07/05/2018 07:38   Ir Ext Nephroureteral Cath Exchange  Result Date: 07/04/2018 INDICATION: Urinary tract infection, chronic nephrostomies EXAM: FLUOROSCOPIC EXCHANGE OF THE LEFT NEPHROSTOMY CATHETER FLUOROSCOPIC EXCHANGE OF THE RIGHT NEPHROURETERAL CATHETER COMPARISON:  04/11/2018 MEDICATIONS: NONE. ANESTHESIA/SEDATION: Fentanyl 50 mcg IV; Versed 1.0 mg IV Moderate Sedation Time:  10 MINUTES The patient was continuously monitored during the procedure by the interventional radiology nurse under my  direct supervision. CONTRAST:  10 CC-administered into the collecting system(s) FLUOROSCOPY TIME:  Fluoroscopy Time: 1 minutes 48 seconds (14 mGy). COMPLICATIONS: None immediate. PROCEDURE: Informed written consent was obtained from the patient after a thorough discussion of the procedural risks, benefits and alternatives. All questions were addressed. Maximal Sterile Barrier Technique was utilized including caps, mask, sterile gowns, sterile gloves, sterile drape, hand hygiene and skin antiseptic. A timeout was performed prior to the initiation of the procedure. left nephrostomy exchange: under sterile conditions, the existing nephrostomy catheter was injected with contrast confirming position in the renal pelvis. catheter was successfully cut and removed over a bentson guidewire. new nephrostomy advanced with the retention loop formed in the renal pelvis. contrast injection confirms position. Right nephroureteral catheter exchange: In a similar fashion, the catheter was injected confirming position under sterile conditions. Catheter was exchanged over a Bentson guidewire successfully. Distal loop formed in the bladder. Proximal loop in the renal pelvis. Images obtained for documentation. IMPRESSION: Successful left nephrostomy exchange and right nephroureteral exchange. Electronically Signed   By: Jerilynn Mages.  Shick M.D.   On: 07/04/2018 17:22   Ir Nephrostomy Exchange Left  Result Date: 07/04/2018 INDICATION: Urinary tract infection, chronic nephrostomies EXAM: FLUOROSCOPIC EXCHANGE OF THE LEFT NEPHROSTOMY CATHETER FLUOROSCOPIC EXCHANGE OF THE RIGHT NEPHROURETERAL CATHETER COMPARISON:  04/11/2018 MEDICATIONS: NONE. ANESTHESIA/SEDATION: Fentanyl 50 mcg IV; Versed 1.0 mg IV Moderate Sedation Time:  10 MINUTES The patient was continuously monitored during the procedure by the interventional radiology nurse under my direct supervision. CONTRAST:  10 CC-administered into the collecting system(s) FLUOROSCOPY TIME:   Fluoroscopy Time: 1 minutes 48 seconds (14 mGy). COMPLICATIONS: None immediate. PROCEDURE: Informed written consent was obtained from the patient after a thorough discussion of the procedural risks, benefits and alternatives. All questions were addressed. Maximal Sterile Barrier Technique was utilized including caps, mask, sterile gowns, sterile gloves, sterile drape, hand hygiene and skin antiseptic. A timeout was performed prior to the initiation of the procedure. left nephrostomy exchange: under sterile conditions, the existing nephrostomy catheter was injected with contrast  confirming position in the renal pelvis. catheter was successfully cut and removed over a bentson guidewire. new nephrostomy advanced with the retention loop formed in the renal pelvis. contrast injection confirms position. Right nephroureteral catheter exchange: In a similar fashion, the catheter was injected confirming position under sterile conditions. Catheter was exchanged over a Bentson guidewire successfully. Distal loop formed in the bladder. Proximal loop in the renal pelvis. Images obtained for documentation. IMPRESSION: Successful left nephrostomy exchange and right nephroureteral exchange. Electronically Signed   By: Jerilynn Mages.  Shick M.D.   On: 07/04/2018 17:22    Pending Labs Unresulted Labs (From admission, onward)   Start     Ordered   07/06/18 0500  CBC  Tomorrow morning,   R     07/05/18 0824   07/06/18 4765  Basic metabolic panel  Tomorrow morning,   R     07/05/18 0824      Vitals/Pain Today's Vitals   07/05/18 0641 07/05/18 0700 07/05/18 0730 07/05/18 0800  BP: 117/69 (!) 121/94 (!) 102/56 113/63  Pulse: 89 81 73 79  Resp: 12  13 14   Temp:      TempSrc:      SpO2: 100% 100% 99% 100%  Weight:      Height:      PainSc:        Isolation Precautions No active isolations  Medications Medications  dicyclomine (BENTYL) capsule 10 mg (has no administration in time range)  carvedilol (COREG) tablet 1.56 mg  (has no administration in time range)  cefdinir (OMNICEF) capsule 300 mg (has no administration in time range)  furosemide (LASIX) tablet 40 mg (has no administration in time range)  linaclotide (LINZESS) capsule 145 mcg (has no administration in time range)  mirabegron ER (MYRBETRIQ) tablet 25 mg (has no administration in time range)  mirtazapine (REMERON) tablet 15 mg (has no administration in time range)  nitroGLYCERIN (NITROSTAT) SL tablet 0.4 mg (has no administration in time range)  oxyCODONE (OXYCONTIN) 12 hr tablet 10 mg (has no administration in time range)  oxyCODONE-acetaminophen (PERCOCET) 10-325 MG per tablet 1 tablet (has no administration in time range)  pramipexole (MIRAPEX) tablet 1.5 mg (has no administration in time range)  famotidine (PEPCID) tablet 20 mg (has no administration in time range)  traMADol (ULTRAM) tablet 50 mg (has no administration in time range)  0.9 %  sodium chloride infusion (has no administration in time range)  acetaminophen (TYLENOL) tablet 650 mg (has no administration in time range)    Or  acetaminophen (TYLENOL) suppository 650 mg (has no administration in time range)  ondansetron (ZOFRAN) tablet 4 mg (has no administration in time range)    Or  ondansetron (ZOFRAN) injection 4 mg (has no administration in time range)  cefTRIAXone (ROCEPHIN) 1 g in sodium chloride 0.9 % 100 mL IVPB (0 g Intravenous Stopped 07/05/18 0803)    Mobility walks with person assist

## 2018-07-05 NOTE — Progress Notes (Signed)
Pt admitted from ED. Pt recently discharged from unit on 07/04/18 after bilateral nephrostomy tube placement. Pt became agitated overnight at home and pulled out L nephrostomy tube and used scissors to cut the R nephrostomy tube off. Pt was then sent to IR for replacement of both nephrostomy tubes. PT became very agitated and combative towards the IR staff and security was called. When pt arrived to unit, pt states "they did not give me sedation during the procedure and I was very upset". Pt is currently resting comfortably in bed. No concerns at this time.

## 2018-07-05 NOTE — ED Provider Notes (Signed)
[facility] DEPT Provider Note   CSN: [id number] Arrival date & time: 07/05/18  0433     History   Chief Complaint Chief Complaint  Patient presents with  . nephrostomy tube removal    HPI Charles Coffey is a 83 y.o. male.  The history is provided by the spouse.  Illness  This is a new problem. The current episode started 1 to 2 hours ago. The problem occurs constantly. The problem has not changed since onset.Pertinent negatives include no chest pain, no abdominal pain, no headaches and no shortness of breath. Nothing aggravates the symptoms. Nothing relieves the symptoms. He has tried nothing for the symptoms. The treatment provided no relief.  Has reportedly per the wife had episodes of "dementia like" illness.  Has been seen by neurology for this, per wife nothing has been found and this may be a medication effect but the episodes have persisted.  Reportedly the patient got up from bed went to the kitchen coated scissors in rubbing alcohol and cut his newly placed (yesterday) nephrostomy tube.  He then pulled the left tube out.    Past Medical History:  Diagnosis Date  . Aortic atherosclerosis ([place])   . BPH with urinary obstruction   . CAD (coronary artery disease)    a.  MI 1995, CABG x 3 2002 (patient says that he had LIMA and RIMA grafts). b. ETT-Cardiolite (10/15) with EF 48%, apical scar, no ischemia. c. Nuc 07/2017 abnormal -> cath was performed,  patent LIMA to LAD and SVG to OM 2. RIMA to RCA is atretic. The right coronary artery is occluded distally with extensive collaterals from the LAD, medical therapy.   . [facility])    bladder cancer  . Cervical spondylosis without myelopathy 2020-03-1716  . Chronic low back pain    sees Dr. Kary Kos   . GERD (gastroesophageal reflux disease)   . Hiatal hernia   . Hyperlipidemia   . Hypertension   . IBS (irritable bowel syndrome)   . Ischemic cardiomyopathy   . Myocardial infarction ([place])   .  RBBB   . Restless legs syndrome   . Statin intolerance   . Syncope    a. in 2015 - no apparent cause, was taking sleep medicine at the time. Cardiac workup unremarkable.  . Tubular adenoma of colon     Patient Active Problem List   Diagnosis Date Noted  . Malfunction of nephrostomy tube ([place])   . Lower urinary tract infectious disease 06/30/2018  . Port-A-Cath in place 03/31/2018  . PVC (premature ventricular contraction) 02/25/2018  . Malignant neoplasm of urinary bladder ([place]) 02/25/2018  . Catheter-associated urinary tract infection ([place]) 02/17/2018  . Nephrostomy complication ([place]) 98/92/1194  . Chronic systolic heart failure ([place]) 02/07/2018  . Malnutrition of moderate degree 02/01/2018  . Acute respiratory failure with hypoxia ([place]) 01/31/2018  . Acute CHF (congestive heart failure) ([place]) 01/31/2018  . Thrombocytopathia ([place])   . DVT (deep venous thrombosis) ([place]) 01/11/2018  . Acute lower UTI 01/01/2018  . Sepsis secondary to UTI ([place]) 01/01/2018  . Colostomy in place [facility]) 12/04/2017  . Rectal stenosis   . Low vitamin B12 level 11/30/2017  . Irritable bowel syndrome with constipation   . Pyelonephritis 11/28/2017  . Fever 11/28/2017  . Ileus ([place]) 11/28/2017  . Elevated troponin 11/28/2017  . Urothelial carcinoma of bladder ([place]) 11/28/2017  . Dehydration 11/28/2017  . Thrombocytopenia ([place]) 11/28/2017  . Hydronephrosis due to obstructive malignant neoplasm of bladder ([place])  11/25/2017  . Anemia 09/20/2017  . Chronic kidney disease, stage 3 ([place]) 09/20/2017  . Angina pectoris ([place])   . Abnormal stress test   . Cervical spondylosis without myelopathy 2020-02-716  . PAD (peripheral artery disease) ([place]) 03/04/2015  . Coronary artery disease involving native heart without angina pectoris 11/01/2014  . Acid reflux 11/01/2014  . H/O male genital system disorder 11/01/2014  . Restless leg 11/01/2014  . Acne erythematosa 11/01/2014  . Syncope 09/24/2014  . Chronic low  back pain 11/10/2013  . Cardiomyopathy, ischemic 11/29/2012  . Hyperlipidemia LDL goal <70 10/15/2010  . RESTLESS LEGS SYNDROME 10/15/2010  . Essential hypertension 10/15/2010  . MYOCARDIAL INFARCTION, HX OF 10/15/2010  . Coronary artery disease of native heart with stable angina pectoris ([place]) 10/15/2010  . GERD 10/15/2010  . BENIGN PROSTATIC HYPERTROPHY 10/15/2010  . BENIGN PROSTATIC HYPERTROPHY, WITH OBSTRUCTION 10/15/2010  . COLONIC POLYPS, HX OF 10/15/2010    Past Surgical History:  Procedure Laterality Date  . COLONOSCOPY  10/17/2014   per Dr. Hilarie Fredrickson, tubular adenomas, repeat in 3 yrs   . COLONOSCOPY WITH PROPOFOL N/A 12/01/2017   Procedure: COLONOSCOPY WITH PROPOFOL;  Surgeon: Milus Banister, MD;  Location: [facility];  Service: Endoscopy;  Laterality: N/A;  . CYSTOSCOPY W/ RETROGRADES Left 11/25/2017   Procedure: CYSTOSCOPY WITH RETROGRADE PYELOGRAM;  Surgeon: Irine Seal, MD;  Location: [facility] ORS;  Service: Urology;  Laterality: Left;  . HEART BYPASS    . IR CONVERT RIGHT NEPHROSTOMY TO NEPHROURETERAL CATH  02/04/2018  . IR EXT NEPHROURETERAL CATH EXCHANGE  04/11/2018  . IR EXT NEPHROURETERAL CATH EXCHANGE  07/04/2018  . IR FLUORO GUIDE CV LINE RIGHT  12/27/2017  . IR NEPHROSTOGRAM LEFT THRU EXISTING ACCESS  12/24/2017  . IR NEPHROSTOMY EXCHANGE LEFT  01/28/2018  . IR NEPHROSTOMY EXCHANGE LEFT  02/04/2018  . IR NEPHROSTOMY EXCHANGE LEFT  02/25/2018  . IR NEPHROSTOMY EXCHANGE LEFT  07/04/2018  . IR NEPHROSTOMY EXCHANGE RIGHT  12/24/2017  . IR NEPHROSTOMY EXCHANGE RIGHT  01/28/2018  . IR NEPHROSTOMY EXCHANGE RIGHT  04/11/2018  . IR NEPHROSTOMY PLACEMENT LEFT  11/28/2017  . IR NEPHROSTOMY PLACEMENT RIGHT  12/03/2017  . IR PATIENT EVAL TECH 0-60 MINS  03/24/2018  . IR PATIENT EVAL TECH 0-60 MINS  03/28/2018  . IR PATIENT EVAL TECH 0-60 MINS  04/25/2018  . IR PATIENT EVAL TECH 0-60 MINS  06/20/2018  . IR US GUIDE VASC ACCESS RIGHT  12/27/2017  . LAPAROSCOPY N/A 12/01/2017   Procedure:  LAPAROSCOPIC DIVERTING OSTOMY;  Surgeon: Stark Klein, MD;  Location: [facility];  Service: General;  Laterality: N/A;  . LEFT HEART CATH AND CORS/GRAFTS ANGIOGRAPHY N/A 08/25/2017   Procedure: LEFT HEART CATH AND CORS/GRAFTS ANGIOGRAPHY;  Surgeon: Wellington Hampshire, MD;  Location: [facility];  Service: Cardiovascular;  Laterality: N/A;  . LUMBAR FUSION  2003   L3-L4  . PROSTATE SURGERY  06-27-12   per Dr. Roni Bread, had CTT  . TONSILLECTOMY    . TRANSURETHRAL RESECTION OF BLADDER TUMOR N/A 11/25/2017   Procedure: TRANSURETHRAL RESECTION OF BLADDER TUMOR (TURBT);  Surgeon: Irine Seal, MD;  Location: [facility];  Service: Urology;  Laterality: N/A;        Home Medications    Prior to Admission medications   Medication Sig Start Date End Date Taking? Authorizing Provider  acetaminophen (TYLENOL) 500 MG tablet Take 1,000 mg by mouth every 8 (eight) hours as needed for mild pain or moderate pain.   Yes [provider]  carvedilol (COREG) 3.125 MG tablet Take 1 tablet (3.125 mg total) by mouth 2 (two) times daily. Patient taking differently: Take 1.56 mg by mouth 2 (two) times daily.  07/04/18 08/03/18 Yes Donne Hazel, MD  diphenhydrAMINE (BENADRYL) 50 MG tablet Take 100 mg by mouth at bedtime.   Yes [provider]  furosemide (LASIX) 40 MG tablet Take 1 tablet (40 mg total) by mouth 2 (two) times daily. Patient taking differently: Take 40 mg by mouth as needed for fluid or edema.  05/13/18  Yes End, Harrell Gave, MD  lidocaine-prilocaine (EMLA) cream Apply 1 application topically as needed. 12/15/17  Yes Shadad, Mathis Dad, MD  LINZESS 290 MCG CAPS capsule TAKE 1 CAP BY MOUTH ONCE DAILY 30 MIN PRIOR TO A MEAL. MUST HAVE OFFICE VISIT FOR FURTHER REFILLS 05/12/18  Yes Pyrtle, Lajuan Lines, MD  megestrol (MEGACE) 40 MG/ML suspension TAKE 10ML BY MOUTH TWICE A DAY 05/13/18  Yes Shadad, Mathis Dad, MD  mirabegron ER (MYRBETRIQ) 25 MG TB24 tablet Take 1 tablet (25 mg total) by mouth daily. 02/04/18  Yes  Short, Noah Delaine, MD  mirtazapine (REMERON) 15 MG tablet Take 1 tablet (15 mg total) by mouth at bedtime. 05/30/18  Yes Tanner, Lyndon Code., PA-C  mupirocin ointment (BACTROBAN) 2 % Place 1 application into the nose 2 (two) times daily.   Yes [provider]  oxyCODONE (OXYCONTIN) 10 mg 12 hr tablet Take 1 tablet (10 mg total) by mouth 2 (two) times daily. 06/02/18  Yes Wyatt Portela, MD  oxyCODONE-acetaminophen (PERCOCET) 10-325 MG tablet Take 1 tablet by mouth every 4 (four) hours as needed for pain. 05/16/18  Yes Wyatt Portela, MD  pramipexole (MIRAPEX) 1 MG tablet TAKE 1 AND 1/2 TABLETS BY MOUTH AT BEDTIME 06/29/18  Yes [name], MD  prochlorperazine (COMPAZINE) 10 MG tablet Take 1 tablet (10 mg total) by mouth every 6 (six) hours as needed for nausea or vomiting. 12/15/17  Yes Wyatt Portela, MD  ranitidine (ZANTAC) 300 MG tablet TAKE 1 TABLET BY MOUTH TWICE A DAY 02/10/18  Yes Pyrtle, Lajuan Lines, MD  Tetrahydroz-Glyc-Hyprom-PEG (VISINE MAXIMUM REDNESS RELIEF OP) Place 2 drops into both eyes 2 (two) times daily as needed (dry eyes).    Yes [provider]  traMADol (ULTRAM) 50 MG tablet Take 50 mg by mouth as needed for moderate pain.   Yes [provider]  triamcinolone cream (KENALOG) 0.1 % Apply topically 3 (three) times daily. To affected areas 07/04/18  Yes Donne Hazel, MD  cefdinir (OMNICEF) 300 MG capsule Take 1 capsule (300 mg total) by mouth 2 (two) times daily for 7 days. 07/04/18 07/11/18  Donne Hazel, MD  dicyclomine (BENTYL) 10 MG capsule TAKE 1-2 BY MOUTH EVERY 6 HOURS AS NEEDED FOR RECTAL SPASMS. Patient not taking: Reported on 07/05/2018 03/07/18   Jerene Bears, MD  feeding supplement, ENSURE ENLIVE, (ENSURE ENLIVE) LIQD Take 237 mLs by mouth 2 (two) times daily between meals. Patient not taking: Reported on 07/05/2018 02/03/18   Janece Canterbury, MD  guaiFENesin-codeine 100-10 MG/5ML syrup Take 5 mLs by mouth every 6 (six) hours as needed for cough. Patient not  taking: Reported on 06/30/2018 06/13/18   Harle Stanford., PA-C  nitroGLYCERIN (NITROSTAT) 0.4 MG SL tablet Place 1 tablet (0.4 mg total) under the tongue every 5 (five) minutes as needed. 08/19/17 06/30/18  Charlie Pitter, PA-C    Family History Family History  Problem Relation Age of Onset  . Heart  attack Mother   . Aneurysm Father        femoral artery  . Heart disease Brother   . Heart disease Maternal Uncle        x 2  . Colon cancer Neg Hx   . Esophageal cancer Neg Hx   . Pancreatic cancer Neg Hx   . Kidney disease Neg Hx   . Liver disease Neg Hx     Social History Social History   Tobacco Use  . Smoking status: Former Smoker    Types: Cigarettes    Last attempt to quit: 12/28/1978    Years since quitting: 39.5  . Smokeless tobacco: Never Used  Substance Use Topics  . Alcohol use: Yes    Alcohol/week: 1.2 oz    Types: 1 Glasses of wine, 1 Cans of beer per week    Comment: occassional  . Drug use: No     Allergies   Antihistamines, loratadine-type and Statins   Review of Systems Review of Systems  Respiratory: Negative for shortness of breath.   Cardiovascular: Negative for chest pain.  Gastrointestinal: Negative for abdominal pain.  Skin: Positive for wound.  Neurological: Negative for headaches.  Psychiatric/Behavioral: Positive for confusion.  All other systems reviewed and are negative.    Physical Exam Updated Vital Signs BP 121/77 (BP Location: Left Arm)   Pulse 89   Temp 98.5 F (36.9 C) (Oral)   Resp 16   Ht 5' 7.5" (1.715 m)   Wt 79.4 kg (175 lb)   SpO2 100%   BMI 27.00 kg/m   Physical Exam  Constitutional: He appears well-developed and well-nourished. No distress.  HENT:  Head: Normocephalic and atraumatic.  Mouth/Throat: No oropharyngeal exudate.  Eyes: Pupils are equal, round, and reactive to light. Conjunctivae are normal.  Neck: Normal range of motion. Neck supple.  Cardiovascular: Normal rate, regular rhythm, normal heart sounds and  intact distal pulses.  Pulmonary/Chest: Effort normal and breath sounds normal. No stridor.  Abdominal: Soft. Bowel sounds are normal. He exhibits no mass. There is no tenderness. There is no rebound and no guarding.  Musculoskeletal: Normal range of motion.       Arms: Neurological: He is alert.  Skin: Skin is warm and dry. Capillary refill takes less than 2 seconds.  Psychiatric: He has a normal mood and affect.     ED Treatments / Results  Labs (all labs ordered are listed, but only abnormal results are displayed) Labs Reviewed  CBC WITH DIFFERENTIAL/PLATELET  I-STAT CHEM 8, ED    EKG None  Radiology Ir Ext Nephroureteral Cath Exchange  Result Date: 07/04/2018 INDICATION: Urinary tract infection, chronic nephrostomies EXAM: FLUOROSCOPIC EXCHANGE OF THE LEFT NEPHROSTOMY CATHETER FLUOROSCOPIC EXCHANGE OF THE RIGHT NEPHROURETERAL CATHETER COMPARISON:  04/11/2018 MEDICATIONS: NONE. ANESTHESIA/SEDATION: Fentanyl 50 mcg IV; Versed 1.0 mg IV Moderate Sedation Time:  10 MINUTES The patient was continuously monitored during the procedure by the interventional radiology nurse under my direct supervision. CONTRAST:  10 CC-administered into the collecting system(s) FLUOROSCOPY TIME:  Fluoroscopy Time: 1 minutes 48 seconds (14 mGy). COMPLICATIONS: None immediate. PROCEDURE: Informed written consent was obtained from the patient after a thorough discussion of the procedural risks, benefits and alternatives. All questions were addressed. Maximal Sterile Barrier Technique was utilized including caps, mask, sterile gowns, sterile gloves, sterile drape, hand hygiene and skin antiseptic. A timeout was performed prior to the initiation of the procedure. left nephrostomy exchange: under sterile conditions, the existing nephrostomy catheter was injected with contrast confirming position  in the renal pelvis. catheter was successfully cut and removed over a bentson guidewire. new nephrostomy advanced with the  retention loop formed in the renal pelvis. contrast injection confirms position. Right nephroureteral catheter exchange: In a similar fashion, the catheter was injected confirming position under sterile conditions. Catheter was exchanged over a Bentson guidewire successfully. Distal loop formed in the bladder. Proximal loop in the renal pelvis. Images obtained for documentation. IMPRESSION: Successful left nephrostomy exchange and right nephroureteral exchange. Electronically Signed   By: Jerilynn Mages.  Shick M.D.   On: 07/04/2018 17:22   Ir Nephrostomy Exchange Left  Result Date: 07/04/2018 INDICATION: Urinary tract infection, chronic nephrostomies EXAM: FLUOROSCOPIC EXCHANGE OF THE LEFT NEPHROSTOMY CATHETER FLUOROSCOPIC EXCHANGE OF THE RIGHT NEPHROURETERAL CATHETER COMPARISON:  04/11/2018 MEDICATIONS: NONE. ANESTHESIA/SEDATION: Fentanyl 50 mcg IV; Versed 1.0 mg IV Moderate Sedation Time:  10 MINUTES The patient was continuously monitored during the procedure by the interventional radiology nurse under my direct supervision. CONTRAST:  10 CC-administered into the collecting system(s) FLUOROSCOPY TIME:  Fluoroscopy Time: 1 minutes 48 seconds (14 mGy). COMPLICATIONS: None immediate. PROCEDURE: Informed written consent was obtained from the patient after a thorough discussion of the procedural risks, benefits and alternatives. All questions were addressed. Maximal Sterile Barrier Technique was utilized including caps, mask, sterile gowns, sterile gloves, sterile drape, hand hygiene and skin antiseptic. A timeout was performed prior to the initiation of the procedure. left nephrostomy exchange: under sterile conditions, the existing nephrostomy catheter was injected with contrast confirming position in the renal pelvis. catheter was successfully cut and removed over a bentson guidewire. new nephrostomy advanced with the retention loop formed in the renal pelvis. contrast injection confirms position. Right nephroureteral  catheter exchange: In a similar fashion, the catheter was injected confirming position under sterile conditions. Catheter was exchanged over a Bentson guidewire successfully. Distal loop formed in the bladder. Proximal loop in the renal pelvis. Images obtained for documentation. IMPRESSION: Successful left nephrostomy exchange and right nephroureteral exchange. Electronically Signed   By: Jerilynn Mages.  Shick M.D.   On: 07/04/2018 17:22    Procedures Procedures (including critical care time)  Medications Ordered in ED Medications - No data to display   555 am case d/w Dr. Laurence Ferrari of IR, please admit to medicine and have order for nephro tube change.       Final Clinical Impressions(s) / ED Diagnoses   Case d/w triad, to be admitted and kept NPO for IR procedure.     Kristin Barcus, MD 07/05/18 [id number]

## 2018-07-06 ENCOUNTER — Observation Stay (HOSPITAL_COMMUNITY): Payer: Medicare HMO

## 2018-07-06 DIAGNOSIS — T83022A Displacement of nephrostomy catheter, initial encounter: Secondary | ICD-10-CM | POA: Diagnosis not present

## 2018-07-06 DIAGNOSIS — I10 Essential (primary) hypertension: Secondary | ICD-10-CM

## 2018-07-06 DIAGNOSIS — Z4682 Encounter for fitting and adjustment of non-vascular catheter: Secondary | ICD-10-CM | POA: Diagnosis not present

## 2018-07-06 DIAGNOSIS — N1330 Unspecified hydronephrosis: Secondary | ICD-10-CM

## 2018-07-06 DIAGNOSIS — I5022 Chronic systolic (congestive) heart failure: Secondary | ICD-10-CM | POA: Diagnosis not present

## 2018-07-06 DIAGNOSIS — N183 Chronic kidney disease, stage 3 (moderate): Secondary | ICD-10-CM | POA: Diagnosis not present

## 2018-07-06 DIAGNOSIS — C679 Malignant neoplasm of bladder, unspecified: Secondary | ICD-10-CM

## 2018-07-06 LAB — CBC
HCT: 22 % — ABNORMAL LOW (ref 39.0–52.0)
HEMOGLOBIN: 7.3 g/dL — AB (ref 13.0–17.0)
MCH: 31.9 pg (ref 26.0–34.0)
MCHC: 33.2 g/dL (ref 30.0–36.0)
MCV: 96.1 fL (ref 78.0–100.0)
Platelets: 61 10*3/uL — ABNORMAL LOW (ref 150–400)
RBC: 2.29 MIL/uL — AB (ref 4.22–5.81)
RDW: 15.5 % (ref 11.5–15.5)
WBC: 4.3 10*3/uL (ref 4.0–10.5)

## 2018-07-06 LAB — BASIC METABOLIC PANEL
Anion gap: 9 (ref 5–15)
BUN: 23 mg/dL (ref 8–23)
CALCIUM: 9.6 mg/dL (ref 8.9–10.3)
CHLORIDE: 113 mmol/L — AB (ref 98–111)
CO2: 16 mmol/L — AB (ref 22–32)
Creatinine, Ser: 1.27 mg/dL — ABNORMAL HIGH (ref 0.61–1.24)
GFR calc non Af Amer: 51 mL/min — ABNORMAL LOW (ref >60)
GFR, EST AFRICAN AMERICAN: 59 mL/min — AB (ref >60)
GLUCOSE: 94 mg/dL (ref 70–99)
POTASSIUM: 3.9 mmol/L (ref 3.5–5.1)
Sodium: 138 mmol/L (ref 135–145)

## 2018-07-06 LAB — CULTURE, BLOOD (ROUTINE X 2)
Culture: NO GROWTH
Culture: NO GROWTH

## 2018-07-06 MED ORDER — QUETIAPINE FUMARATE 25 MG PO TABS: 25.0000 mg | ORAL_TABLET | Freq: Every day | ORAL | 0 refills | Status: DC

## 2018-07-06 MED ORDER — CEFDINIR 300 MG PO CAPS: 300.0000 mg | ORAL_CAPSULE | Freq: Two times a day (BID) | ORAL | Status: AC

## 2018-07-06 NOTE — Progress Notes (Signed)
Patient pulled the L and R nephrostomy tubing apart. The connections were cleaned and reattached. Nephrostomy dressings were done. Also, patient took off the colostomy bag and flange. Notified portables to see if there is a replacement. Also, patient removed his PIV. Will attempt to restart PIV. PCP was notified

## 2018-07-06 NOTE — Discharge Instructions (Signed)
Charles Coffey,  You were in the hospital to have your catheters replaced. You also had some confusion which resolved on its own. This may be secondary to medications. I have prescribed Seroquel. Please take this at night and follow-up with your primary care physician. Your blood count was also on the lower side. With a goal transfusion for hemoglobin less than 7, you did not receive any blood. Please follow-up with Dr. [name] to follow-up on your blood counts. With regard to your tubes, please follow-up with interventional radiology.

## 2018-07-06 NOTE — Discharge Summary (Signed)
Physician Discharge Summary  Charles Coffey TML:[id number] DOB: 1935-02-21 DOA: 07/05/2018  PCP: Charles Morale, MD  Admit date: 07/05/2018 Discharge date: 07/06/2018  Admitted From: Home Disposition: Home  Recommendations for Outpatient Follow-up:  1. Follow up with PCP in 1 week 2. Follow-up with IR in 6 weeks 3. Please obtain BMP/CBC in one week to recheck creatinine and hemoglobin/platelets 4. Please follow up on the following pending results: None  Home Health: RN Equipment/Devices: None  Discharge Condition: Stable CODE STATUS: Full code Diet recommendation: Heart healthy   Brief/Interim Summary:  Admission HPI written by Aline August, MD   Chief Complaint: Nephrostomy tube removal  HPI: Charles Coffey is a 83 y.o. male with medical history significant of hypertension, hyperlipidemia, CAD status post MI, restless leg syndrome, stage IV bladder cancer status post resection on 11/25/2017 status post chemotherapy and bilateral nephrostomy tubes placed in 2018, diverting colostomy on 12/01/2017 secondary to colonic obstruction, chronic kidney disease stage III, anemia/thrombocytopenia with recent admission on 06/30/2018 and discharged on 07/04/2018 after being treated for healthcare associated pneumonia and Proteus UTI with intravenous antibiotics initially and discharged on [place] and also had exchange of bilateral nephrostomy tubes on 07/04/2018 by interventional radiology presented today after nephrostomy tube removal.  Patient is currently sleepy and wakes up slightly on calling his name does not answer much questions.  Wife present at bedside states that earlier this morning patient apparently got up from bed went to the kitchen and cut his nephrostomy tube with scissors.  He then pulled the left tube out.  Wife was asleep at that time.  Patient was brought to the emergency room.  Patient's wife reports that patient has had intermittent episodes of confusion at night even prior to  recent hospitalization.  Patient has trouble sleeping and is on medications for the same.  ED Course: IR was consulted who recommended that patient be admitted to medicine service and IR would exchange the tube.  Intravenous Rocephin was given in the ED.    Hospital course:  Nephrostomy tube replacement Patient removed nephrostomy tube as an outpatient. Replaced successfully on 7/9. Outpatient IR follow-up.  Delirium Patient received ativan overnight which did not help symptoms. This has been an intermittent issue. Patient has been evaluated by neurology as an outpatient without concern for dementia. Question medication effect. No need medications on board. Will trial Seroquel at night.  Chronic anemia/thrombocytopenia Stable. No active bleeding. Outpatient follow-up.  Proteus UTI Present on admission. Continue outpatient regimen.  CKD stage III Stable.  Restless leg syndrome Continue Mirapex  Chronic combined systolic and diastolic heart failure Euvolemic.   Discharge Diagnoses:  Active Problems:   Essential hypertension   Coronary artery disease involving native heart without angina pectoris   Anemia   Chronic kidney disease, stage 3 (HCC)   Hydronephrosis due to obstructive malignant neoplasm of bladder (HCC)   Urothelial carcinoma of bladder (HCC)   Thrombocytopathia (HCC)   Chronic systolic heart failure (HCC)   Displacement of nephrostomy tube ([place])   Nephrostomy tube displaced [facility])    Discharge Instructions  Discharge Instructions    AMB Referral to [place] [facility]   Complete by:  As directed    Please assign to [place] for CHF and high risk for readmission. [facility] Pharmacist for medication review specifically side effects and interactions. Written consent obtained. Please contact wife Charles Coffey at [phone] post discharge. Has had 5 hospitalizations in the past 6 months. Unplanned readmission risk score of 34% (  extreme). PCP office (  Dennis Brassfield listed as doing toc). Please call with questions. Thanks. Marthenia Rolling, [place], RN,BSN-THN [facility] [phone]   Reason for consult:  Please assign to [place] [facility] Pharmacist   Diagnoses of:   Heart Failure Other     Other Diagnosis:  CAD, bladder cancer, HTN   Expected date of contact:  1-3 days (reserved for hospital discharges)   Call MD for:  redness, tenderness, or signs of infection (pain, swelling, redness, odor or green/yellow discharge around incision site)   Complete by:  As directed    Call MD for:  severe uncontrolled pain   Complete by:  As directed    Call MD for:  temperature >100.4   Complete by:  As directed    Increase activity slowly   Complete by:  As directed      Allergies as of 07/06/2018      Reactions   Antihistamines, Loratadine-type Other (See Comments)   Unable to urinate   Statins Other (See Comments)   liver effects      Medication List    STOP taking these medications   diphenhydrAMINE 50 MG tablet Commonly known as:  BENADRYL   traMADol 50 MG tablet Commonly known as:  ULTRAM     TAKE these medications   acetaminophen 500 MG tablet Commonly known as:  TYLENOL Take 1,000 mg by mouth every 8 (eight) hours as needed for mild pain or moderate pain.   carvedilol 3.125 MG tablet Commonly known as:  COREG Take 1 tablet (3.125 mg total) by mouth 2 (two) times daily. What changed:  how much to take   cefdinir 300 MG capsule Commonly known as:  OMNICEF Take 1 capsule (300 mg total) by mouth 2 (two) times daily for 5 days.   dicyclomine 10 MG capsule Commonly known as:  BENTYL TAKE 1-2 BY MOUTH EVERY 6 HOURS AS NEEDED FOR RECTAL SPASMS.   feeding supplement (ENSURE ENLIVE) Liqd Take 237 mLs by mouth 2 (two) times daily between meals.   furosemide 40 MG tablet Commonly known as:  LASIX Take 1 tablet (40 mg total) by mouth 2 (two) times daily. What changed:    when to take  this  reasons to take this   guaiFENesin-codeine 100-10 MG/5ML syrup Take 5 mLs by mouth every 6 (six) hours as needed for cough.   lidocaine-prilocaine cream Commonly known as:  EMLA Apply 1 application topically as needed.   LINZESS 290 MCG Caps capsule Generic drug:  linaclotide TAKE 1 CAP BY MOUTH ONCE DAILY 30 MIN PRIOR TO A MEAL. MUST HAVE OFFICE VISIT FOR FURTHER REFILLS   megestrol 40 MG/ML suspension Commonly known as:  MEGACE TAKE 10ML BY MOUTH TWICE A DAY   mirabegron ER 25 MG Tb24 tablet Commonly known as:  MYRBETRIQ Take 1 tablet (25 mg total) by mouth daily.   mirtazapine 15 MG tablet Commonly known as:  REMERON Take 1 tablet (15 mg total) by mouth at bedtime.   mupirocin ointment 2 % Commonly known as:  BACTROBAN Place 1 application into the nose 2 (two) times daily.   nitroGLYCERIN 0.4 MG SL tablet Commonly known as:  NITROSTAT Place 1 tablet (0.4 mg total) under the tongue every 5 (five) minutes as needed.   oxyCODONE 10 mg 12 hr tablet Commonly known as:  OXYCONTIN Take 1 tablet (10 mg total) by mouth 2 (two) times daily.   oxyCODONE-acetaminophen 10-325 MG tablet Commonly known as:  PERCOCET  Take 1 tablet by mouth every 4 (four) hours as needed for pain.   pramipexole 1 MG tablet Commonly known as:  MIRAPEX TAKE 1 AND 1/2 TABLETS BY MOUTH AT BEDTIME   prochlorperazine 10 MG tablet Commonly known as:  COMPAZINE Take 1 tablet (10 mg total) by mouth every 6 (six) hours as needed for nausea or vomiting.   QUEtiapine 25 MG tablet Commonly known as:  SEROQUEL Take 1 tablet (25 mg total) by mouth at bedtime.   ranitidine 300 MG tablet Commonly known as:  ZANTAC TAKE 1 TABLET BY MOUTH TWICE A DAY   triamcinolone cream 0.1 % Commonly known as:  KENALOG Apply topically 3 (three) times daily. To affected areas   VISINE MAXIMUM REDNESS RELIEF OP Place 2 drops into both eyes 2 (two) times daily as needed (dry eyes).      Follow-up Information     Charles Morale, MD. Schedule an appointment as soon as possible for a visit in 1 week(s).   Specialty:  Family Medicine Why:  Intermittent confusion Contact information: [address] [place] [address] [phone]          Allergies  Allergen Reactions  . Antihistamines, Loratadine-Type Other (See Comments)    Unable to urinate  . Statins Other (See Comments)    liver effects    Consultations:  Interventional radiology   Procedures/Studies: Dg Chest 2 View  Result Date: 06/30/2018 CLINICAL DATA:  Cough. Body aches. Abdominal pain. Back pain. History of bladder cancer with last chemotherapy Tuesday. Cough for couple of days. Previous smoker. History of hypertension. EXAM: CHEST - 2 VIEW COMPARISON:  06/13/2018 FINDINGS: Postoperative changes in the mediastinum. Power port type central venous catheter with tip over the cavoatrial junction region. No pneumothorax. Cardiac enlargement. Mild central pulmonary vascular congestion. No edema or consolidation in the lungs. No blunting of costophrenic angles. No pneumothorax. Calcification of the aorta. Mediastinal contours appear intact. IMPRESSION: Cardiac enlargement with mild pulmonary vascular congestion. No pulmonary edema or consolidation. Aortic atherosclerosis. Electronically Signed   By: Lucienne Capers M.D.   On: 06/30/2018 21:38   Dg Chest 2 View  Result Date: 06/13/2018 CLINICAL DATA:  One week of productive cough, congestion, weakness, and shortness of breath. History of bladder malignancy. EXAM: CHEST - 2 VIEW COMPARISON:  Chest x-ray of April 08, 2018. FINDINGS: The lungs are adequately inflated. The lung markings are slightly more conspicuous in the right middle lobe anteriorly and laterally. The left lung is clear. The heart and pulmonary vascularity are normal. There are post CABG changes. There is calcification in the wall of the aortic arch. The porta catheter tip projects over the junction of the middle  and distal thirds of the SVC. There are bilateral nephrostomy tubes in the upper abdomen. IMPRESSION: Subsegmental atelectasis or early pneumonia in the right middle lobe anteriorly and laterally. Followup PA and lateral chest X-ray is recommended in 3-4 weeks following trial of antibiotic therapy to ensure resolution and exclude underlying malignancy. Electronically Signed   By: David  Martinique M.D.   On: 06/13/2018 12:32   Dg Abd 1 View  Result Date: 07/06/2018 CLINICAL DATA:  Displacement of nephrostomy catheter. Patient pulled on tubes and disconnected them. EXAM: ABDOMEN - 1 VIEW COMPARISON:  Nephrostomy catheter placement yesterday. FINDINGS: Bilateral nephrostomy tubes project at the level of L2-L3. No discontinuity. Excreted contrast in the urinary bladder from nephrostomy placement yesterday. Normal bowel gas pattern, no free air. IMPRESSION: Bilateral nephrostomy tubes project in the expected  location of the renal fossa. Recommend monitoring tube drainage to ensure function. Electronically Signed   By: Jeb Levering M.D.   On: 07/06/2018 06:33   Dg Chest Port 1 View  Result Date: 07/02/2018 CLINICAL DATA:  Evaluate pneumonia. History of hypertension, coronary artery disease. EXAM: PORTABLE CHEST 1 VIEW COMPARISON:  Chest x-rays dated 06/30/2018 and 06/13/2018. FINDINGS: Stable cardiomegaly. Aortic atherosclerosis. Mild central pulmonary vascular congestion is stable, without overt alveolar pulmonary edema. No confluent opacity to suggest pneumonia on today's exam. No pleural effusion or pneumothorax seen. RIGHT chest wall Port-A-Cath is stable in position with tip at the level of the lower SVC. No acute or suspicious osseous finding. IMPRESSION: 1. No evidence of pneumonia on today's exam. 2. Mild central pulmonary vascular congestion without overt alveolar pulmonary edema. 3. Stable mild cardiomegaly. 4. Aortic atherosclerosis. Electronically Signed   By: Franki Cabot M.D.   On: 07/02/2018 18:00    Ir Patient Eval Tech 0-60 Mins  Result Date: 06/20/2018 Chipper Oman     06/20/2018  5:18 PM Patient came in today for education of how to use foley bag with pcn tubes placed at [facility] 3 weeks ago.  He also had complaints of pain and pulling at the insertion site of the left pcn tube.  I evaluated the tubes.  Neither tube had a stat-lock retention device on it to keep it from pulling.  As a result the sutures in the left side were tearing thru the skin and the site was bleeding minimally.  I removed the sutures on both sides and placed the statlock retention devices on to secure the tubes.  I once again advised the patient not to tuck the catheter directly down under the waste line of his pants.  This causes significant pull on the catheter.  I dressed the sites slightly lateral to help him tape in along his side to aid in the pulling.  Ct Renal Stone Study  Result Date: 07/05/2018 CLINICAL DATA:  Bilateral nephrostomy tubes pulled out. EXAM: CT ABDOMEN AND PELVIS WITHOUT CONTRAST TECHNIQUE: Multidetector CT imaging of the abdomen and pelvis was performed following the standard protocol without IV contrast. COMPARISON:  CT scan of June 30, 2018 and March 10, 2018. FINDINGS: Lower chest: 8 mm nodule is noted in left posterior costophrenic sulcus which is not significant changed compared to prior exam. No other abnormality seen in visualized lung bases. Hepatobiliary: No focal liver abnormality is seen. No gallstones, gallbladder wall thickening, or biliary dilatation. Pancreas: Unremarkable. No pancreatic ductal dilatation or surrounding inflammatory changes. Spleen: Normal in size without focal abnormality. Adrenals/Urinary Tract: Adrenal glands appear normal. Stable position of right-sided nephroureteral stent is noted with distal tip within the urinary bladder. Left nephrostomy catheter noted on prior exam is no longer present and has been withdrawn. Mildly dilated left renal pelvis is noted. Left renal  atrophy is again noted. No significant left ureteral dilatation is noted. Stomach/Bowel: The stomach and appendix are unremarkable. Stable appearance of transverse colostomy is noted. There is no evidence of bowel obstruction or inflammation. Vascular/Lymphatic: Aortic atherosclerosis. No enlarged abdominal or pelvic lymph nodes. Reproductive: Stable mild prostatic enlargement. Other: No abdominal wall hernia or abnormality. No abdominopelvic ascites. Musculoskeletal: Mild grade 1 anterolisthesis of L5-S1 is noted secondary to bilateral L5 pars defects. No acute osseous abnormality is noted. IMPRESSION: Stable 8 mm nodule is noted in left posterior costophrenic sulcus. Stable position of right-sided nephroureteral stent is noted with distal tip within the urinary bladder. Left nephrostomy  catheter noted on prior exam is no longer present and has been withdrawn. Mildly dilated left renal pelvis is noted. Stable appearance of transverse colostomy. Stable mild prostatic enlargement. Aortic Atherosclerosis (ICD10-I70.0). Electronically Signed   By: Marijo Conception, M.D.   On: 07/05/2018 07:38   Ct Renal Stone Study  Result Date: 06/30/2018 CLINICAL DATA:  83 year old male with flank pain, abdominal and back pain. Bladder cancer with most recent chemotherapy last week. EXAM: CT ABDOMEN AND PELVIS WITHOUT CONTRAST TECHNIQUE: Multidetector CT imaging of the abdomen and pelvis was performed following the standard protocol without IV contrast. COMPARISON:  Restaging noncontrast CT chest abdomen and pelvis 03/10/2018 and earlier. FINDINGS: Lower chest: New tree-in-bud nodularity in both lower lobes (series 6, image 22). No lower lobe consolidation or pleural effusion. Small right middle lobe lung nodules are stable on series 6 images 3 and 7. No pericardial effusion. Stable cardiac size at the upper limits of normal. Hepatobiliary: Negative noncontrast liver and gallbladder. Pancreas: Negative. Spleen: Negative.  Adrenals/Urinary Tract: Adrenal glands remain normal. Chronic partial left renal atrophy with chronic left nephrostomy tube. The nephrostomy tube has been exchanged since 03/10/2018, and the nephrostomy loop is now located in the left renal sinus. The left renal pelvis size has not significantly changed. Chronic stranding at the left ureteropelvic junction and along the course of the left ureter is stable. No left hydroureter identified. Chronic right nephroureteral stent is in place and appears appropriately positioned. Trace gas within the nondilated right renal collecting system is noted on series 2, image 29. Stable right renal pelvis size. Stable right ureter. Diminutive, decompressed urinary bladder. Bladder wall thickening, but no discrete bladder mass is evident in the absence of contrast. Stomach/Bowel: Regressed but not resolved rectal wall thickening which may be treatment related. Decompressed rectum and sigmoid colon. Mild sigmoid diverticulosis. Decompressed left colon and splenic flexure. The patient has a chronic transverse colostomy with mucous fistula. Negative transverse and right colon aside from some retained stool. Appendix is diminutive or absent. Negative terminal ileum. No dilated small bowel. Negative stomach and duodenum. No abdominal free air, free fluid. Vascular/Lymphatic: Vascular patency is not evaluated in the absence of IV contrast. Aortoiliac calcified atherosclerosis. No acute lymphadenopathy. Occasional calcified retroperitoneal and mesenteric lymph nodes are stable. Reproductive: Stable and negative (possible small fat containing left inguinal hernia). Other: Regressed but not resolved perirectal and presacral soft tissue thickening and stranding, probably treatment related (series 2, image 74). No pelvic free fluid. Musculoskeletal: Chronic lumbar spine degeneration and prior fusion. No acute or suspicious osseous lesion identified. IMPRESSION: 1. New tree-in-bud nodularity in  both lower lungs highly suspicious for acute bilateral lower lobe distal airway infection. No pleural effusion. 2. Left nephrostomy tube exchange since March. The left nephrostomy loop now projects more centrally within the renal sinus, but no acute obstruction of the left kidney is evident. Stable positioning of right nephroureteral stent with no adverse features. 3. Regressed but not resolved perirectal and presacral soft tissue thickening and stranding, probably treatment related. 4. No new abnormality identified in the noncontrast abdomen or pelvis. 5. Aortic Atherosclerosis (ICD10-I70.0). Electronically Signed   By: Genevie Ann M.D.   On: 06/30/2018 20:58   Ir Ext Nephroureteral Cath Exchange  Result Date: 07/04/2018 INDICATION: Urinary tract infection, chronic nephrostomies EXAM: FLUOROSCOPIC EXCHANGE OF THE LEFT NEPHROSTOMY CATHETER FLUOROSCOPIC EXCHANGE OF THE RIGHT NEPHROURETERAL CATHETER COMPARISON:  04/11/2018 MEDICATIONS: NONE. ANESTHESIA/SEDATION: Fentanyl 50 mcg IV; Versed 1.0 mg IV Moderate Sedation Time:  10 MINUTES The patient  was continuously monitored during the procedure by the interventional radiology nurse under my direct supervision. CONTRAST:  10 CC-administered into the collecting system(s) FLUOROSCOPY TIME:  Fluoroscopy Time: 1 minutes 48 seconds (14 mGy). COMPLICATIONS: None immediate. PROCEDURE: Informed written consent was obtained from the patient after a thorough discussion of the procedural risks, benefits and alternatives. All questions were addressed. Maximal Sterile Barrier Technique was utilized including caps, mask, sterile gowns, sterile gloves, sterile drape, hand hygiene and skin antiseptic. A timeout was performed prior to the initiation of the procedure. left nephrostomy exchange: under sterile conditions, the existing nephrostomy catheter was injected with contrast confirming position in the renal pelvis. catheter was successfully cut and removed over a bentson guidewire.  new nephrostomy advanced with the retention loop formed in the renal pelvis. contrast injection confirms position. Right nephroureteral catheter exchange: In a similar fashion, the catheter was injected confirming position under sterile conditions. Catheter was exchanged over a Bentson guidewire successfully. Distal loop formed in the bladder. Proximal loop in the renal pelvis. Images obtained for documentation. IMPRESSION: Successful left nephrostomy exchange and right nephroureteral exchange. Electronically Signed   By: Jerilynn Mages.  Shick M.D.   On: 07/04/2018 17:22   Ir Nephrostomy Exchange Left  Result Date: 07/05/2018 INDICATION: History of bladder cancer with history of left-sided percutaneous nephrostomy catheter and right-sided nephroureteral catheter. Patient underwent technically successful exchange and repositioning of the bilateral catheters on 07/04/2018, however removed the left-sided nephrostomy and cut the external portion of the right-sided nephroureteral catheter last evening during a spell of altered mental status/confusion (the patient states he doesn't remember the event) and as such presents today for fluoroscopic guided exchange and replacement. Note, the patient underwent conversion of a right-sided nephrostomy catheter to a right-sided nephroureteral catheter on 02/04/2018 however the right-sided nephroureteral catheter has not been cap as the patient experiences great discomfort during attempted voiding and as such the decision has been made to convert the nephroureteral catheter back to a standard nephrostomy. EXAM: FLUOROSCOPIC GUIDED EXCHANGE/REPLACEMENT OF BILATERAL NEPHROSTOMY CATHETERS COMPARISON:  Multiple previous bilateral percutaneous nephrostomy catheter exchanges, most recently on 07/04/2018; CT abdomen pelvis-07/05/2018 CONTRAST:  25 mL Isovue-300 administered into the collecting system MEDICATIONS: Fentanyl 50 mg IV FLUOROSCOPY TIME:  44 Minutes 3 seconds COMPLICATIONS: None  immediate. TECHNIQUE: Informed written consent was obtained from the patient after a discussion of the risks, benefits and alternatives to treatment. Questions regarding the procedure were encouraged and answered. A timeout was performed prior to the initiation of the procedure. Bilateral flanks as well as remaining external portion of the right-sided nephroureteral catheter was prepped and draped usual sterile fashion. Maximum barrier sterile technique with sterile gowns and gloves were used for the procedure. A timeout was performed prior to the initiation of the procedure. The remaining external portion of the right-sided nephroureteral catheter was cannulated with a Bentson wire. Under intermittent fluoroscopic guidance, the existing nephroureteral catheter was exchanged for a Kumpe catheter. Contrast injection demonstrated location of the right renal pelvis. Next, over a short Amplatz wire, a new 10.2 French percutaneous nephrostomy catheter was placed with end coiled and locked within the right renal pelvis. Contrast injection confirmed appropriate positioning and functionality. _________________________________________________________ Next, attention was paid towards replacement of the removed left-sided nephrostomy catheter. The ostomy site from the prior left-sided nephrostomy catheter was cannulated with a Christmas tree adapter and contrast injection demonstrated persistent though atretic patency of the left-sided nephrostomy catheter track. With the use of a regular glidewire, a Kumpe catheter was advanced through the nephrostomy  catheter track to the level of the left renal pelvis. Contrast injection confirmed appropriate positioning. Next, under intermittent fluoroscopic guidance, the Kumpe catheter was exchanged for a new 12 French nephrostomy catheter with end coiled and locked within the left renal pelvis. Contrast injection confirmed appropriate positioning. Both nephrostomy catheters were  connected to gravity bags and secured at the skin entrance site within interrupted suture as well as StatLock devices. Dressings were placed. Patient experienced discomfort during recanalization of the left-sided nephrostomy catheter track though otherwise tolerated the procedure well without immediate postprocedural complication. FINDINGS: Successful fluoroscopic guided conversion right-sided nephroureteral catheter to a standard a 10 French percutaneous nephrostomy catheter with end coiled and locked within the right renal pelvis. Successful fluoroscopic guided recanalization of chronic left-sided nephrostomy catheter track with placement of a new 12 French percutaneous nephrostomy catheter with end coiled and locked within the left pelvis. IMPRESSION: 1. Successful fluoroscopic guided conversion of right-sided nephroureteral catheter to a 10 French percutaneous nephrostomy catheter with end coiled and locked within the right renal pelvis. 2. Successful fluoroscopic guided recanalization of left-sided nephrostomy catheter track ultimately allowing placement of a new 12 French percutaneous nephrostomy catheter with end coiled and locked within left renal pelvis. PLAN: Given patient's discomfort during today's procedure, would recommend subsequent procedures performed conscious sedation. Electronically Signed   By: Sandi Mariscal M.D.   On: 07/05/2018 11:20   Ir Nephrostomy Exchange Left  Result Date: 07/04/2018 INDICATION: Urinary tract infection, chronic nephrostomies EXAM: FLUOROSCOPIC EXCHANGE OF THE LEFT NEPHROSTOMY CATHETER FLUOROSCOPIC EXCHANGE OF THE RIGHT NEPHROURETERAL CATHETER COMPARISON:  04/11/2018 MEDICATIONS: NONE. ANESTHESIA/SEDATION: Fentanyl 50 mcg IV; Versed 1.0 mg IV Moderate Sedation Time:  10 MINUTES The patient was continuously monitored during the procedure by the interventional radiology nurse under my direct supervision. CONTRAST:  10 CC-administered into the collecting system(s)  FLUOROSCOPY TIME:  Fluoroscopy Time: 1 minutes 48 seconds (14 mGy). COMPLICATIONS: None immediate. PROCEDURE: Informed written consent was obtained from the patient after a thorough discussion of the procedural risks, benefits and alternatives. All questions were addressed. Maximal Sterile Barrier Technique was utilized including caps, mask, sterile gowns, sterile gloves, sterile drape, hand hygiene and skin antiseptic. A timeout was performed prior to the initiation of the procedure. left nephrostomy exchange: under sterile conditions, the existing nephrostomy catheter was injected with contrast confirming position in the renal pelvis. catheter was successfully cut and removed over a bentson guidewire. new nephrostomy advanced with the retention loop formed in the renal pelvis. contrast injection confirms position. Right nephroureteral catheter exchange: In a similar fashion, the catheter was injected confirming position under sterile conditions. Catheter was exchanged over a Bentson guidewire successfully. Distal loop formed in the bladder. Proximal loop in the renal pelvis. Images obtained for documentation. IMPRESSION: Successful left nephrostomy exchange and right nephroureteral exchange. Electronically Signed   By: Jerilynn Mages.  Shick M.D.   On: 07/04/2018 17:22   Ir Nephrostomy Exchange Right  Result Date: 07/05/2018 INDICATION: History of bladder cancer with history of left-sided percutaneous nephrostomy catheter and right-sided nephroureteral catheter. Patient underwent technically successful exchange and repositioning of the bilateral catheters on 07/04/2018, however removed the left-sided nephrostomy and cut the external portion of the right-sided nephroureteral catheter last evening during a spell of altered mental status/confusion (the patient states he doesn't remember the event) and as such presents today for fluoroscopic guided exchange and replacement. Note, the patient underwent conversion of a  right-sided nephrostomy catheter to a right-sided nephroureteral catheter on 02/04/2018 however the right-sided nephroureteral catheter has not been  cap as the patient experiences great discomfort during attempted voiding and as such the decision has been made to convert the nephroureteral catheter back to a standard nephrostomy. EXAM: FLUOROSCOPIC GUIDED EXCHANGE/REPLACEMENT OF BILATERAL NEPHROSTOMY CATHETERS COMPARISON:  Multiple previous bilateral percutaneous nephrostomy catheter exchanges, most recently on 07/04/2018; CT abdomen pelvis-07/05/2018 CONTRAST:  25 mL [id number] administered into the collecting system MEDICATIONS: Fentanyl 50 mg IV FLUOROSCOPY TIME:  44 Minutes 3 seconds COMPLICATIONS: None immediate. TECHNIQUE: Informed written consent was obtained from the patient after a discussion of the risks, benefits and alternatives to treatment. Questions regarding the procedure were encouraged and answered. A timeout was performed prior to the initiation of the procedure. Bilateral flanks as well as remaining external portion of the right-sided nephroureteral catheter was prepped and draped usual sterile fashion. Maximum barrier sterile technique with sterile gowns and gloves were used for the procedure. A timeout was performed prior to the initiation of the procedure. The remaining external portion of the right-sided nephroureteral catheter was cannulated with a Bentson wire. Under intermittent fluoroscopic guidance, the existing nephroureteral catheter was exchanged for a Kumpe catheter. Contrast injection demonstrated location of the right renal pelvis. Next, over a short Amplatz wire, a new 10.2 French percutaneous nephrostomy catheter was placed with end coiled and locked within the right renal pelvis. Contrast injection confirmed appropriate positioning and functionality. _________________________________________________________ Next, attention was paid towards replacement of the removed left-sided  nephrostomy catheter. The ostomy site from the prior left-sided nephrostomy catheter was cannulated with a Christmas tree adapter and contrast injection demonstrated persistent though atretic patency of the left-sided nephrostomy catheter track. With the use of a regular glidewire, a Kumpe catheter was advanced through the nephrostomy catheter track to the level of the left renal pelvis. Contrast injection confirmed appropriate positioning. Next, under intermittent fluoroscopic guidance, the Kumpe catheter was exchanged for a new 12 French nephrostomy catheter with end coiled and locked within the left renal pelvis. Contrast injection confirmed appropriate positioning. Both nephrostomy catheters were connected to gravity bags and secured at the skin entrance site within interrupted suture as well as StatLock devices. Dressings were placed. Patient experienced discomfort during recanalization of the left-sided nephrostomy catheter track though otherwise tolerated the procedure well without immediate postprocedural complication. FINDINGS: Successful fluoroscopic guided conversion right-sided nephroureteral catheter to a standard a 10 French percutaneous nephrostomy catheter with end coiled and locked within the right renal pelvis. Successful fluoroscopic guided recanalization of chronic left-sided nephrostomy catheter track with placement of a new 12 French percutaneous nephrostomy catheter with end coiled and locked within the left pelvis. IMPRESSION: 1. Successful fluoroscopic guided conversion of right-sided nephroureteral catheter to a 10 French percutaneous nephrostomy catheter with end coiled and locked within the right renal pelvis. 2. Successful fluoroscopic guided recanalization of left-sided nephrostomy catheter track ultimately allowing placement of a new 12 French percutaneous nephrostomy catheter with end coiled and locked within left renal pelvis. PLAN: Given patient's discomfort during today's  procedure, would recommend subsequent procedures performed conscious sedation. Electronically Signed   By: Sandi Mariscal M.D.   On: 07/05/2018 11:20      Subjective: No bleeding. Episode of confusion overnight that has resolved.  Discharge Exam: Vitals:   07/05/18 2050 07/06/18 0525  BP: 104/63 118/76  Pulse: 77 85  Resp: 18 18  Temp: 98.7 F (37.1 C) 98.6 F (37 C)  SpO2: 100% 98%   Vitals:   07/05/18 0905 07/05/18 1414 07/05/18 2050 07/06/18 0525  BP: 112/60 110/76 104/63 118/76  Pulse: 72 77  77 85  Resp: 20 20 18 18   Temp: 98.8 F (37.1 C) 97.8 F (36.6 C) 98.7 F (37.1 C) 98.6 F (37 C)  TempSrc: Oral Oral    SpO2: 99% 95% 100% 98%  Weight: 78.9 kg (174 lb)     Height: 5\' 8"  (1.727 m)       General: Pt is alert, awake, not in acute distress Cardiovascular: RRR, S1/S2 +, no rubs, no gallops Respiratory: CTA bilaterally, no wheezing, no rhonchi Abdominal: Soft, NT, ND, bowel sounds +. Colostomy with liquid stool. Nephrostomy sites without active bleeding. Extremities: no edema, no cyanosis    The results of significant diagnostics from this hospitalization (including imaging, microbiology, ancillary and laboratory) are listed below for reference.     Microbiology: Recent Results (from the past 240 hour(s))  Urine Culture     Status: Abnormal   Collection Time: 06/30/18  7:49 PM  Result Value Ref Range Status   Specimen Description   Final    URINE, CLEAN CATCH Performed at [facility], [place] [address] [place], [place] [address]    Special Requests   Final    NONE Performed at [facility], [place] [address], [place], [place] [address]    Culture >=100,000 COLONIES/mL PROTEUS MIRABILIS (A)  Final   Report Status 07/03/2018 FINAL  Final   Organism ID, Bacteria PROTEUS MIRABILIS (A)  Final      Susceptibility   Proteus mirabilis - MIC*    AMPICILLIN >=32 RESISTANT Resistant     CEFAZOLIN <=4 SENSITIVE Sensitive      CEFTRIAXONE <=1 SENSITIVE Sensitive     CIPROFLOXACIN >=4 RESISTANT Resistant     GENTAMICIN <=1 SENSITIVE Sensitive     IMIPENEM 8 INTERMEDIATE Intermediate     NITROFURANTOIN RESISTANT Resistant     TRIMETH/SULFA >=320 RESISTANT Resistant     AMPICILLIN/SULBACTAM 4 SENSITIVE Sensitive     PIP/TAZO <=4 SENSITIVE Sensitive     * >=100,000 COLONIES/mL PROTEUS MIRABILIS  Culture, blood (Routine X 2) w Reflex to ID Panel     Status: None (Preliminary result)   Collection Time: 06/30/18  9:50 PM  Result Value Ref Range Status   Specimen Description BLOOD PORTA CATH  Final   Special Requests   Final    AEROBIC BOTTLE ONLY BCAV Performed at [facility] [address] [place], [place] [address]    Culture   Final    NO GROWTH 4 DAYS Performed at [facility], [place] [address], [place], [place] [address]    Report Status PENDING  Incomplete  Culture, blood (Routine X 2) w Reflex to ID Panel     Status: None (Preliminary result)   Collection Time: 06/30/18  9:50 PM  Result Value Ref Range Status   Specimen Description BLOOD LEFT FOREARM  Final   Special Requests   Final    AEROBIC BOTTLE ONLY BCAV Performed at [facility], [place] [address], [place], [place] [address]    Culture   Final    NO GROWTH 4 DAYS Performed at [facility], [place] [address] [place], [place] [address]    Report Status PENDING  Incomplete  MRSA PCR Screening     Status: Abnormal   Collection Time: 07/01/18  9:54 AM  Result Value Ref Range Status   MRSA by PCR POSITIVE (A) NEGATIVE Final    Comment:        The GeneXpert MRSA Assay (FDA approved for  NASAL specimens only), is one component of a comprehensive MRSA colonization surveillance program. It is not intended to diagnose MRSA infection nor to guide or monitor treatment for MRSA infections. RESULT CALLED TO, READ BACK BY AND VERIFIED WITH: Jethro Bastos [id number] @ [address] Performed at  [facility] [address] [place], [place] [address]      Labs: BNP (last 3 results) Recent Labs    01/31/18 0153  BNP 1,025.8*   Basic Metabolic Panel: Recent Labs  Lab 07/01/18 0448 07/02/18 0424 07/03/18 0420 07/04/18 0346 07/05/18 0617 07/06/18 0446  NA 134* 139 140 141 141 138  K 4.1 3.8 3.2* 3.8 3.9 3.9  CL 109 113* 112* 115* 112* 113*  CO2 18* 18* 16* 20*  --  16*  GLUCOSE 94 99 124* 95 99 94  BUN 31* 24* 24* 22 20 23   CREATININE 1.61* 1.50* 1.63* 1.45* 1.40* 1.27*  CALCIUM 9.1 9.2 9.8 9.4  --  9.6   Liver Function Tests: Recent Labs  Lab 06/30/18 2009 07/01/18 0448  AST 15 16  ALT 15 13  ALKPHOS 48 42  BILITOT 0.4 0.9  PROT 6.6 6.0*  ALBUMIN 3.3* 3.0*   No results for input(s): LIPASE, AMYLASE in the last 168 hours. No results for input(s): AMMONIA in the last 168 hours. CBC: Recent Labs  Lab 06/30/18 2009  07/02/18 0424 07/03/18 0420 07/04/18 0346 07/05/18 0610 07/05/18 0617 07/06/18 0446  WBC 5.8   < > 5.3 6.2 3.6* 4.6  --  4.3  NEUTROABS 3.1  --   --   --  1.6* 2.2  --   --   HGB 8.8*   < > 7.4* 8.7* 7.5* 8.1* 7.1* 7.3*  HCT 26.6*   < > 22.2* 25.5* 22.6* 23.8* 21.0* 22.0*  MCV 96.7   < > 96.9 94.8 95.8 95.6  --  96.1  PLT 53*   < > 56* 65* 69* 65*  --  61*   < > = values in this interval not displayed.   Cardiac Enzymes: No results for input(s): CKTOTAL, CKMB, CKMBINDEX, TROPONINI in the last 168 hours. BNP: Invalid input(s): POCBNP CBG: No results for input(s): GLUCAP in the last 168 hours. D-Dimer No results for input(s): DDIMER in the last 72 hours. Hgb A1c No results for input(s): HGBA1C in the last 72 hours. Lipid Profile No results for input(s): CHOL, HDL, LDLCALC, TRIG, CHOLHDL, LDLDIRECT in the last 72 hours. Thyroid function studies No results for input(s): TSH, T4TOTAL, T3FREE, THYROIDAB in the last 72 hours.  Invalid input(s): FREET3 Anemia work up No results for input(s): VITAMINB12, FOLATE,  FERRITIN, TIBC, IRON, RETICCTPCT in the last 72 hours. Urinalysis    Component Value Date/Time   COLORURINE AMBER (A) 06/30/2018 1949   APPEARANCEUR CLEAR 06/30/2018 1949   LABSPEC 1.016 06/30/2018 1949   PHURINE 7.0 06/30/2018 1949   GLUCOSEU NEGATIVE 06/30/2018 1949   HGBUR LARGE (A) 06/30/2018 1949   HGBUR negative 10/15/2010 1251   BILIRUBINUR NEGATIVE 06/30/2018 1949   BILIRUBINUR n 12/01/2016 1521   KETONESUR NEGATIVE 06/30/2018 1949   PROTEINUR >=300 (A) 06/30/2018 1949   UROBILINOGEN 0.2 12/01/2016 1521   UROBILINOGEN 0.2 10/15/2010 1251   NITRITE NEGATIVE 06/30/2018 1949   LEUKOCYTESUR LARGE (A) 06/30/2018 1949   Sepsis Labs Invalid input(s): PROCALCITONIN,  WBC,  LACTICIDVEN Microbiology Recent Results (from the past 240 hour(s))  Urine Culture     Status: Abnormal   Collection Time: 06/30/18  7:49 PM  Result Value Ref Range Status   Specimen Description   Final    URINE, CLEAN CATCH Performed at [facility], [place] [address], [place], [place] [address]    Special Requests   Final    NONE Performed at [facility], [place] [address] [place], [place] [address]    Culture >=100,000 COLONIES/mL PROTEUS MIRABILIS (A)  Final   Report Status 07/03/2018 FINAL  Final   Organism ID, Bacteria PROTEUS MIRABILIS (A)  Final      Susceptibility   Proteus mirabilis - MIC*    AMPICILLIN >=32 RESISTANT Resistant     CEFAZOLIN <=4 SENSITIVE Sensitive     CEFTRIAXONE <=1 SENSITIVE Sensitive     CIPROFLOXACIN >=4 RESISTANT Resistant     GENTAMICIN <=1 SENSITIVE Sensitive     IMIPENEM 8 INTERMEDIATE Intermediate     NITROFURANTOIN RESISTANT Resistant     TRIMETH/SULFA >=320 RESISTANT Resistant     AMPICILLIN/SULBACTAM 4 SENSITIVE Sensitive     PIP/TAZO <=4 SENSITIVE Sensitive     * >=100,000 COLONIES/mL PROTEUS MIRABILIS  Culture, blood (Routine X 2) w Reflex to ID Panel     Status: None (Preliminary result)   Collection Time: 06/30/18  9:50  PM  Result Value Ref Range Status   Specimen Description BLOOD PORTA CATH  Final   Special Requests   Final    AEROBIC BOTTLE ONLY BCAV Performed at [facility] [address] [place], [place] [address]    Culture   Final    NO GROWTH 4 DAYS Performed at [facility], [place] [address], [place], [place] [address]    Report Status PENDING  Incomplete  Culture, blood (Routine X 2) w Reflex to ID Panel     Status: None (Preliminary result)   Collection Time: 06/30/18  9:50 PM  Result Value Ref Range Status   Specimen Description BLOOD LEFT FOREARM  Final   Special Requests   Final    AEROBIC BOTTLE ONLY BCAV Performed at [facility], [place] [address], [place], [place] [address]    Culture   Final    NO GROWTH 4 DAYS Performed at [facility], [place] [address] [place], [place] [address]    Report Status PENDING  Incomplete  MRSA PCR Screening     Status: Abnormal   Collection Time: 07/01/18  9:54 AM  Result Value Ref Range Status   MRSA by PCR POSITIVE (A) NEGATIVE Final    Comment:        The GeneXpert MRSA Assay (FDA approved for NASAL specimens only), is one component of a comprehensive MRSA colonization surveillance program. It is not intended to diagnose MRSA infection nor to guide or monitor treatment for MRSA infections. RESULT CALLED TO, READ BACK BY AND VERIFIED WITH: Jethro Bastos [id number] @ [address] Performed at [facility] [address] [place], [place] [address]       SIGNED:   Cordelia Poche, MD [facility] 07/06/2018, 11:41 AM

## 2018-07-06 NOTE — Evaluation (Signed)
Physical Therapy Evaluation Patient Details Name: Charles Coffey MRN: [id number] DOB: 1935-05-30 Today's Date: 07/06/2018   History of Present Illness  Pt is an 83 y.o. male with PMH of hypertension, hyperlipidemia, CAD s/p MI, RLS, stage 4, bladder cancer s/p resection (11/25/2017), chemo, and bilateral nephrostomy tube 2018, and diverting colostomy 12/01/2017 secondary to colonic obstruction.  He reported to the ED with his wife due to getting up during the night and removing the nephrostomy tubes.     Clinical Impression  PT eval complete. Pt is independent with all functional mobility. No further PT intervention indicated. Pt to d/c home today. PT signing off.    Follow Up Recommendations No PT follow up;Supervision - Intermittent    Equipment Recommendations  None recommended by PT    Recommendations for Other Services       Precautions / Restrictions Precautions Precautions: None      Mobility  Bed Mobility Overal bed mobility: Independent                Transfers Overall transfer level: Independent                  Ambulation/Gait Ambulation/Gait assistance: Independent Gait Distance (Feet): 350 Feet Assistive device: None Gait Pattern/deviations: WFL(Within Functional Limits)   Gait velocity interpretation: >2.62 ft/sec, indicative of community ambulatory    Stairs Stairs: (declined stairs due to IV)          Wheelchair Mobility    Modified Rankin (Stroke Patients Only)       Balance Overall balance assessment: No apparent balance deficits (not formally assessed)                                           Pertinent Vitals/Pain Pain Assessment: No/denies pain    Home Living Family/patient expects to be discharged to:: Private residence Living Arrangements: Spouse/significant other Available Help at Discharge: Family;Available 24 hours/day Type of Home: House Home Access: Level entry     Home Layout: Two  level;Able to live on main level with bedroom/bathroom Home Equipment: None      Prior Function Level of Independence: Independent         Comments: active     Hand Dominance   Dominant Hand: Right    Extremity/Trunk Assessment   Upper Extremity Assessment Upper Extremity Assessment: Overall WFL for tasks assessed    Lower Extremity Assessment Lower Extremity Assessment: Overall WFL for tasks assessed    Cervical / Trunk Assessment Cervical / Trunk Assessment: Normal  Communication   Communication: No difficulties  Cognition Arousal/Alertness: Awake/alert Behavior During Therapy: WFL for tasks assessed/performed Overall Cognitive Status: Within Functional Limits for tasks assessed                                        General Comments      Exercises     Assessment/Plan    PT Assessment Patent does not need any further PT services  PT Problem List         PT Treatment Interventions      PT Goals (Current goals can be found in the Care Plan section)  Acute Rehab PT Goals Patient Stated Goal: home PT Goal Formulation: All assessment and education complete, DC therapy    Frequency  Barriers to discharge        Co-evaluation               AM-PAC PT "6 Clicks" Daily Activity  Outcome Measure Difficulty turning over in bed (including adjusting bedclothes, sheets and blankets)?: None Difficulty moving from lying on back to sitting on the side of the bed? : None Difficulty sitting down on and standing up from a chair with arms (e.g., wheelchair, bedside commode, etc,.)?: None Help needed moving to and from a bed to chair (including a wheelchair)?: None Help needed walking in hospital room?: None Help needed climbing 3-5 steps with a railing? : None 6 Click Score: 24    End of Session   Activity Tolerance: Patient tolerated treatment well Patient left: in bed;with call bell/phone within reach;with family/visitor  present Nurse Communication: Mobility status PT Visit Diagnosis: Difficulty in walking, not elsewhere classified (R26.2)    Time: 1275-1700 PT Time Calculation (min) (ACUTE ONLY): 15 min   Charges:   PT Evaluation $PT Eval Low Complexity: 1 Low     PT G Codes:        Lorrin Goodell, PT  Office # [phone] Pager [phone]   Lorriane Shire 07/06/2018, 11:58 AM

## 2018-07-06 NOTE — Progress Notes (Signed)
Patient's colostomy from home was [place] [id number] 2 3/4 in bag.

## 2018-07-06 NOTE — Progress Notes (Signed)
Seroquel 25mg  30 days supply $11.65. Pt's wife selected [facility] for [facility] needs and asked to AMY, RN. Referral given to in house rep.

## 2018-07-07 ENCOUNTER — Other Ambulatory Visit: Payer: Self-pay | Admitting: *Deleted

## 2018-07-07 ENCOUNTER — Other Ambulatory Visit: Payer: Self-pay | Admitting: Pharmacist

## 2018-07-07 ENCOUNTER — Telehealth: Payer: Self-pay | Admitting: *Deleted

## 2018-07-07 ENCOUNTER — Ambulatory Visit (HOSPITAL_COMMUNITY)
Admission: RE | Admit: 2018-07-07 | Discharge: 2018-07-07 | Disposition: A | Discharge status: Home / Self Care | Payer: Medicare HMO | Source: Ambulatory Visit | Attending: Oncology | Admitting: Oncology

## 2018-07-07 ENCOUNTER — Encounter (HOSPITAL_COMMUNITY): Payer: Self-pay

## 2018-07-07 ENCOUNTER — Encounter: Payer: Self-pay | Admitting: Pharmacist

## 2018-07-07 DIAGNOSIS — Z960 Presence of urogenital implants: Secondary | ICD-10-CM | POA: Insufficient documentation

## 2018-07-07 DIAGNOSIS — I2510 Atherosclerotic heart disease of native coronary artery without angina pectoris: Secondary | ICD-10-CM | POA: Insufficient documentation

## 2018-07-07 DIAGNOSIS — I700 Atherosclerosis of aorta: Secondary | ICD-10-CM | POA: Insufficient documentation

## 2018-07-07 DIAGNOSIS — R918 Other nonspecific abnormal finding of lung field: Secondary | ICD-10-CM | POA: Diagnosis not present

## 2018-07-07 DIAGNOSIS — C679 Malignant neoplasm of bladder, unspecified: Secondary | ICD-10-CM | POA: Diagnosis present

## 2018-07-07 DIAGNOSIS — K4090 Unilateral inguinal hernia, without obstruction or gangrene, not specified as recurrent: Secondary | ICD-10-CM | POA: Diagnosis not present

## 2018-07-07 NOTE — Telephone Encounter (Signed)
Transition Care Management Follow-up Telephone Call  Per Discharge Summary: Admit date: 07/05/2018 Discharge date: 07/06/2018  Admitted From: Home Disposition: Home  Recommendations for Outpatient Follow-up:  1. Follow up with PCP in 1 week 2. Follow-up with IR in 6 weeks 3. Please obtain BMP/CBC in one week to recheck creatinine and hemoglobin/platelets 4. Please follow up on the following pending results: None  Home Health: RN Equipment/Devices: None  Discharge Condition: Stable CODE STATUS: Full code  --   How have you been since you were released from the hospital? "He just got home yesterday PM. He was hurting but he had a little better night last night and didn't get so much confusion because they started him on new meds. The confusion has been really awful but I'm working with a pharmacist on his meds. He's just tired, he's weak, and he still has some confusion. He can't sleep and he has terrible terrible itching problems at night."   Do you understand why you were in the hospital? yes   Do you understand the discharge instructions? yes   Where were you discharged to? Home   Items Reviewed:  Medications reviewed: yes  Allergies reviewed: yes  Dietary changes reviewed: no  Referrals reviewed: yes   Functional Questionnaire:   Activities of Daily Living (ADLs):   He states they are independent in the following: ambulation, bathing and hygiene, feeding, continence, grooming, toileting and dressing States they require assistance with the following: none, but his wife administers medications and changes colostomy bag and dressing on nephrostomy tube   Any transportation issues/concerns?: no   Any patient concerns? yes, their oncologist at [facility] is leaving the practice and they are not sure they are going to continue receiving care there. She is thinking they will stay with [facility] oncologists. She may also want a referral to a new urologist. They are currently  seeing Dr. [name] w/ [facility]. She would be interested in a urologist at [facility] if urologist is still needed or urological cancer specialist (if that exists).   Confirmed importance and date/time of follow-up visits scheduled yes  Provider Appointment booked with Dr. Alysia Penna 07/21/18 @ 1045. Wife requested appt on this date to give pt a "break" from so many appts.  Confirmed with patient if condition begins to worsen call PCP or go to the ER.  Patient was given the office number and encouraged to call back with question or concerns.  : yes

## 2018-07-07 NOTE — Patient Outreach (Signed)
[facility]) Care Management  [place]   07/07/2018  HILDA WEXLER December 11, 1935 [id number]  Subjective:  Patient was called regarding medication assistance and medication review post discharge. HIPAA identifiers were obtained.  Patient and his wife were on speaker phone.   Charles Coffey is a 83 y.o. male with medical medical conditions including but not limited to:   hypertension, hyperlipidemia, CAD status post MI, restless leg syndrome, stage IV bladder cancer status post resection chronic kidney disease stage III, anemia/thrombocytopenia, and acid reflux disease.  Patient was recently hospitalized to have his nephrostomy tubes reinserted after he removed them in his sleep.    Patient's wife reported that he has "episodes" at night where he does destructive things, does not sleep well and has strange dreams.  They wonder if some of his medications could be leading to these "episodes".  Objective:   Encounter Medications: Outpatient Encounter Medications as of 07/07/2018  Medication Sig Note  . acetaminophen (TYLENOL) 500 MG tablet Take 1,000 mg by mouth every 8 (eight) hours as needed for mild pain or moderate pain.   . carvedilol (COREG) 3.125 MG tablet Take 1 tablet (3.125 mg total) by mouth 2 (two) times daily. (Patient taking differently: Take 1.56 mg by mouth 2 (two) times daily. )   . cefdinir (OMNICEF) 300 MG capsule Take 1 capsule (300 mg total) by mouth 2 (two) times daily for 5 days.   Marland Kitchen dicyclomine (BENTYL) 10 MG capsule TAKE 1-2 BY MOUTH EVERY 6 HOURS AS NEEDED FOR RECTAL SPASMS. (Patient not taking: Reported on 07/05/2018)   . feeding supplement, ENSURE ENLIVE, (ENSURE ENLIVE) LIQD Take 237 mLs by mouth 2 (two) times daily between meals. (Patient not taking: Reported on 07/05/2018)   . furosemide (LASIX) 40 MG tablet Take 1 tablet (40 mg total) by mouth 2 (two) times daily. (Patient taking differently: Take 40 mg by mouth as needed for fluid or edema.  )   . guaiFENesin-codeine 100-10 MG/5ML syrup Take 5 mLs by mouth every 6 (six) hours as needed for cough. (Patient not taking: Reported on 06/30/2018)   . lidocaine-prilocaine (EMLA) cream Apply 1 application topically as needed. 06/30/2018: Prn numbing agent for port access  . LINZESS 290 MCG CAPS capsule TAKE 1 CAP BY MOUTH ONCE DAILY 30 MIN PRIOR TO A MEAL. MUST HAVE OFFICE VISIT FOR FURTHER REFILLS   . megestrol (MEGACE) 40 MG/ML suspension TAKE 10ML BY MOUTH TWICE A DAY   . mirabegron ER (MYRBETRIQ) 25 MG TB24 tablet Take 1 tablet (25 mg total) by mouth daily.   . mirtazapine (REMERON) 15 MG tablet Take 1 tablet (15 mg total) by mouth at bedtime.   . mupirocin ointment (BACTROBAN) 2 % Place 1 application into the nose 2 (two) times daily.   . nitroGLYCERIN (NITROSTAT) 0.4 MG SL tablet Place 1 tablet (0.4 mg total) under the tongue every 5 (five) minutes as needed. 06/30/2018: Prn chest pain  . oxyCODONE (OXYCONTIN) 10 mg 12 hr tablet Take 1 tablet (10 mg total) by mouth 2 (two) times daily.   Marland Kitchen oxyCODONE-acetaminophen (PERCOCET) 10-325 MG tablet Take 1 tablet by mouth every 4 (four) hours as needed for pain.   . pramipexole (MIRAPEX) 1 MG tablet TAKE 1 AND 1/2 TABLETS BY MOUTH AT BEDTIME   . prochlorperazine (COMPAZINE) 10 MG tablet Take 1 tablet (10 mg total) by mouth every 6 (six) hours as needed for nausea or vomiting.   Marland Kitchen QUEtiapine (SEROQUEL) 25 MG tablet Take 1  tablet (25 mg total) by mouth at bedtime.   . ranitidine (ZANTAC) 300 MG tablet TAKE 1 TABLET BY MOUTH TWICE A DAY   . Tetrahydroz-Glyc-Hyprom-PEG (VISINE MAXIMUM REDNESS RELIEF OP) Place 2 drops into both eyes 2 (two) times daily as needed (dry eyes).    . triamcinolone cream (KENALOG) 0.1 % Apply topically 3 (three) times daily. To affected areas    No facility-administered encounter medications on file as of 07/07/2018.     Functional Status: In your present state of health, do you have any difficulty performing the following  activities: 07/05/2018 07/01/2018  Hearing? N N  Vision? N N  Difficulty concentrating or making decisions? N N  Walking or climbing stairs? Y N  Dressing or bathing? N N  Doing errands, shopping? N N  Some recent data might be hidden    Fall/Depression Screening: Fall Risk  05/04/2018 06/11/2015  Falls in the past year? No No   PHQ 2/9 Scores 06/11/2015  PHQ - 2 Score 0      Assessment:  ASSESSMENT: Date Discharged from Hospital: 07/06/18 Date Medication Reconciliation Performed: 07/07/2018  Medications Discontinued at Discharge:  Diphenhydramine   tramadol  New Medications at Discharge:   Quetiapine  Patient was recently discharged from hospital and all medications have been reviewed   Drugs sorted by system:  Neurologic/Psychologic: Mirtazapine, Pramipexole, Quetiapine  Cardiovascular: Carvedilol, furosemide, nitroglycerin  Pulmonary/Allergy: Guaifenesin-Codeine (patient reported not taking),   Gastrointestinal: Dicyclomine, Lines, Megestrol, prochlorperazine (PRN), ranitidine,  Topical: Lidocaine-prilocaine cream, mupirocin, triamcinolone cream  Pain: Acetaminophen, oxycodone, oxycodone/apap  Vitamins/Minerals: Boost feeding supplement,   Infectious Diseases: Cefdinir  Miscellaneous: Myrbetriq, Visine, Keytruda   Medication Review Findings:  Deliurm/drowsiness-patient and his wife wondered if any of his medications could cause the patient's mental status changes.  Unfortunately, the patient's symptoms are most like multifactorial.  In addition to his malignancy, patient is taking Oxycodone, Oxycodone/APAP, prochlorperazine (PRN), mirtazapine, and quetiapine in combination.  Alone and especially in combination, these medications can cause CNS changes, dizziness, drowsiness and increase fall risk.  Patient and his wife reported his night time "episodes" start with extreme itching. Patient is taking Keytruda.  Beryle Flock has the following incidence of  side effects:  Pruritus (11% to 28%), skin rash (13% to 24%),  Fatigue 23%-43%   Patient with patient's wife again today.  She reported the patient still had difficulty sleeping due to itching despite taking both mirtazapine and quetiapine.  She also her oncology office called her today and said the patient's tumors have decreased in size.   PLAN: -called Dr. Hazeline Junker office today to inquire about mirtazapine and quetiapine at bedtime since the patient was complaining of day time drowsiness   -reminded patient's wife to make sure his skin is moisturized (try an ultra moisturizing lotion like Eucerin, CeraVe or add vaseline )--to help with itching  -follow up with patient next week.   Elayne Guerin, PharmD, [facility] Clinical Pharmacist [phone]

## 2018-07-07 NOTE — Telephone Encounter (Signed)
Unable to reach patient at time of TCM Call. Left message for patient to return call when available.  

## 2018-07-07 NOTE — Patient Outreach (Addendum)
[facility] [facility]) Care Management  07/07/2018  Charles Coffey 1935/12/15 [id number]    Telephone Assessment (d/c 7/10)  RN spoke with the consented wife [name]) who provides information concerning pt's current situation. States pt gets very confused at night and has sever itching that may be due to medication he takes. States pt has two nephrostomy tubes that has been pulled out a few times but stable at this time. Caregiver states "It's be difficult" but she purchased a baby monitor and had a good night last night in managing the pt's care. Caregiver has requested a pharmacy consult which has been requested via [place] who will be contacting pt soon. RN inquired further on pt's needs related to his HTN -wife indicated this is controlled with readings 108-112/60 and HF-weighs daily and aware to of the medication Lasix to reduce swelling and symptoms.  Discussed community resources related to levels of care, aide/sitter services and/or [facility] for deconditioning if needed in the future. States pt is currently not mobile but has requested assistance via a Education officer, museum from [facility]. RN discussed some resources in the community and provided [employer] 211 until the pending consult. Caregiver very appreciative and will follow up as needed. RN offered home visits for prevention measures due to pt's history of 5 ED visits over the last 6 months however caregiver feels pt does not have any needs for this RN to address at this time. RN offered safety in the home or to provide a one time home evaluation however this was declined. RN informed caregiver that this RN will remains available and provider a contact name and number if further services are needed related to this RN case manager. Caregiver aware that pharmacy and a social worker would remain involved and the pt's provider will be notified that the offer for case management services via this case manager has been declined but again services  will remain available is needed while active with the other team members currently involved. Caregiver very appreciative and grateful for the information provider. RN has also alerted caregiver that a [facility] calendar would be mailed to the resident that contains services community resources if needed. No other inquires or request at this time. Will alert involved team via [facility] of RN disposition and pt's provider.  Raina Mina, RN Care Management Coordinator [place] Office [phone]

## 2018-07-08 ENCOUNTER — Other Ambulatory Visit: Payer: Self-pay

## 2018-07-08 ENCOUNTER — Encounter: Payer: Self-pay | Admitting: *Deleted

## 2018-07-08 ENCOUNTER — Other Ambulatory Visit: Payer: Self-pay | Admitting: *Deleted

## 2018-07-08 ENCOUNTER — Telehealth: Payer: Self-pay

## 2018-07-08 NOTE — Telephone Encounter (Signed)
[facility] [facility]) Care Management  07/08/2018  LYRIK BURESH 23-Jan-1935 [id number]  Initial outreach to the patients wife, Donie Lemelin, as directed in the patient referral. Mrs. Kina able to verify HIPAA. BSW introduced self and explained the reason for today's call indicating [facility] RNCM, Raina Mina, placed a social work referral to assist the patient with community resources. Mrs. Tiggs is emotional on today's call stating the patient was diagnosed with cancer in December and the "emotional impact it has had on him and me is overwhelming".  Mrs. Yerger states the patient has been placed on several medications that have caused a change in the patient. The patient has experienced a decline in cognitive ability "like dementia". The patient has recently seen a neurologist who determined the patient does not have dementia. Mrs. Willers also indicates the patient has been receiving immunotherapy medications to help treat the cancer stating "it is not curable but it is treatable". Mrs. Cleland goes on to say "but the chemo is too hard on him". Mrs. Mckenzie indicates the patient has heart failure, two nephrostomy tubes which he has a history of pulling out, colostomy bag, chronic pain, anxiety, and delusions at night. Mrs. Ansell reports "we haven't slept in 7 months". She has recently purchased a baby monitor to help give her peace of mind at night and assist with monitoring the patient.  Mrs. Carlisi goes on to say "he's used to being in control and he is not in control anymore. It's devastating to him". Mrs. Granito is interested in therapeutic support for both the patient and herself. Mrs. Washam is concerned for her own mental health stating "my day is built around him". BSW encouraged Mrs. Kanady to include her children or friends to assist with companionship to allow Mrs. Hinojosa to have a break. Mrs. Sartwell states her children are not local and limited on availability. Mrs. Jubb goes  on to state she and the patient have a strong network of friends whom have been supportive and offered to assist with care. However Mrs. Rogus states "I don't let them because I feel it is demeaning to my husband to have our friends see what is really going on".  BSW spoke with Mrs. Nicol about the importance of self care in order to continue the ability to be supportive toward her husband. BSW also encouraged Mrs. Griep to speak to her own physician about her feelings. Mrs. Wortley stated understanding. BSW discussed resources such as caregiver support groups through [employer] as well as obtaining an in home aide to be available at night for patient care. Mrs. Mcbreen is unsure about the ability to afford such services. BSW to mail a list of home care providers to the patients address for review.  BSW discussed palliative care and hospice programs available in the home. Mrs. Ironside unsure if the patient would qualify for hospice stating "he's not ready, he can still walk". BSW educated Mrs. Oatis on the benefits of hospice including in home support by both a nurse and social worker, 24 hour access to an on-call nurse, as well as financial assistance in medications related to admitting hospice d      Daneen Schick       07/08/18 10:23 AM  Incomplete Note

## 2018-07-08 NOTE — Patient Outreach (Signed)
[facility] [facility]) Care Management  07/08/2018  Charles Coffey January 08, 1935 [id number]  Initial outreach to the patients wife, Charles Coffey, as indicated in the patient referral. Mrs. Levitt able to verify HIPAA. BSW introduced self and explained the reason for today's call indicating [facility] RNCM, Raina Mina, placed a social work referral to assist the patient with community resources. Mrs. Sawaya is emotional on today's call stating the patient was diagnosed with cancer in December and the "emotional impact it has had on him and me is overwhelming".  Mrs. Termini states the patient has been placed on several medications that have caused a change in the patient. The patient has experienced a decline in cognitive ability "like dementia". The patient has recently seen a neurologist who determined the patient does not have dementia. Mrs. Brinkmeier also indicates the patient has been receiving immunotherapy medications to help treat the cancer stating "it is not curable but it is treatable". Mrs. Lipson goes on to say "but the chemo is too hard on him". Mrs. Berns indicates the patient has heart failure, two nephrostomy tubes which he has a history of pulling out, colostomy bag, chronic pain, anxiety, and delusions at night. Mrs. Eich reports "we haven't slept in 7 months". She has recently purchased a baby monitor to help give her peace of mind at night and assist with monitoring the patient.  Mrs. Wollard goes on to say "he's used to being in control and he is not in control anymore. It's devastating to him". Mrs. Hedberg is interested in therapeutic support for both the patient and herself. Mrs. Hoadley is concerned for her own mental health stating "my day is built around him. I can feel myself not being healthy". BSW encouraged Mrs. Kimple to include her children or friends to assist with companionship to allow Mrs. Reaume to have a break. Mrs. Saintjean states her children are not local and limited  on availability. Mrs. Cayson goes on to state she and the patient have a strong network of friends whom have been supportive and offered to assist with care. However Mrs. Heitz states "I don't let them because I feel it is demeaning to my husband to have our friends see what is really going on".  BSW spoke with Mrs. Hsiao about the importance of self care in order to continue the ability to be supportive toward her husband. BSW also encouraged Mrs. Dinkins to speak to her own physician about her feelings. Mrs. Crumpler stated understanding. BSW discussed resources such as caregiver support groups through [employer] as well as obtaining an in home aide to be available at night for patient care. Mrs. Mullendore is unsure about the ability to afford such services. BSW to mail a list of home care providers to the patients address for review.  BSW discussed palliative care and hospice programs available in the home. Mrs. Dunlevy unsure if the patient would qualify for hospice stating "he's not ready, he can still walk". BSW educated Mrs. Kerkhoff on the benefits of hospice including in home support by both a nurse and social worker, 24 hour access to an on-call nurse, as well as financial assistance in medications related to admitting hospice diagnosis. Mrs. Acebo is interested in these services but does not seem ready to acknowledge the need. BSW also educated on palliative services in the home discussing end of life planning and support to both the patient and Mrs. Monie, as well as the ability to work alongside the patients physicians as  a support in the home. Mrs. Abrahamsen is interested in this program stating "I didn't know they could come to our home". BSW offered to contact the patients primary physician to request palliative care orders, however, Mrs. Tomberlin still "unsure". BSW encouraged Mrs. Gahan to discuss palliative care with Dr. Sarajane Jews during the next office visit.  Mrs. Nealy inquired if  palliative care is similar to home health because she was informed [employer] would come to the home but she has yet to hear from the company. BSW educated on the differences in home health and palliative care. BSW contact [place] to request start of care date. BSW was informed the referral was received however no start of care date has been scheduled due to the patient being unavailable for "the welcome call". BSW discussed the concern of over 48 hours since patient returned home. BSW requested the welcome call be performed today. BSW notified Mrs. Fils that if she does not hear from [facility] by this afternoon to call them.  BSW informed Mrs. Gutridge that given the need for in home support to the patient this BSW will involve CSW, Nat Christen to provide in home support until a long-term resource is established. Mrs. Strutz thanked this BSW stating "we really need someone to talk to. He wants to end his life because he doesn't want to spend all the money on his care. He's worried that when he dies theres nothing left to care for me". BSW inquired if the patient is actively suicidal or if these were statements he made out of depression. Mrs. Spivack discussed that the last episode of the patient removing his own nephrostomy tubes also involved him talking about ending his life. Mrs. Lechuga stated she and her children "had a long talk with him" and Mrs. Swiatek has "hid the guns".   BSW provided Mrs. Winograd with the mobile crisis number and encouraged her to call this number in the event she fears for the patient or her own life. BSW also discussed the option to call 911 in the event of an emergency, including a mental health emergency. BSW contacted [place] to discuss today's call. CSW to outreach the patient today. BSW contacted Dr. Barbie Banner office and spoke with [facility]. BSW left a detailed message outlining concerns for patients mental health as well as needed in home support through a  program such as palliative care. BSW to route this note to Dr. Sarajane Jews.  BSW to mail Mrs. Dacy several resources today including a list of home care providers, caregiver support resources offered by senior resources of [place], information on hospice and palliative care of [facility] including patient services as well as caregiver support groups, and [place] in home therapy information.  At the end of today's call Mrs. Im feels better in knowing there are available programs to meet the patients needs. Mrs. Breighner is aware she will receive a call from [place], [employer] Mrs. Schmale was provided with this BSW's direct line and understands she may call if resources are needed prior to hearing from [place]. Mrs. Wardlow confirmed knowledge in how to get help by both 911 or mobile crisis if the patient becomes suicidal.  Daneen Schick, [place], CDP [employer] treasurer [phone]

## 2018-07-08 NOTE — Patient Outreach (Signed)
[facility]) Care Management  07/08/2018  VEARL ALLBAUGH 09-15-1935 [id number]   CSW was able to make initial contact with patient's wife, Virginia Francisco today to perform the phone assessment on patient, as well as assess and assist with social work needs and services.  CSW introduced self, explained role and types of services provided through [facility] [facility] ([facility]).  CSW further explained to Mrs. Mofield that S[facility]er works with patient's BSW, also with [facility] [place] [facility], Daneen Schick. CSW then explained the reason for the call, indicating that Mrs. Humble thought that patient would benefit from social work services and resources to assist with counseling and supportive services to help cope with symptoms of depression.  CSW obtained two HIPAA compliant identifiers from Mrs. Shen, which included patient's name and date of birth.  Mrs. Mcloud admits that patient is experiencing signs and symptoms of depression, requesting counseling services for patient.  CSW has agreed to meet with patient and Mrs. Gayler for an initial home visit to provide counseling and supportive services.  CSW will also print EMMI information for patient pertaining specifically to depression, for his independent review.  CSW will teach patient relaxation techniques and deep breathing exercises to help reduce and/or eliminate symptoms of depression. CSW will also converse with patient's Primary Care Physician, Dr. Alysia Penna to request that patient be started on an antidepressant medication to help reduce symptoms of depression.  The initial home visit has been scheduled with patient and Mrs. Imburgia for Wednesday, July 13, 2018 at 9:00AM, per Mrs. Trudell's request.   Mrs. Skelley indicated that she has hidden all of the guns in their home for safety purposes, fearful that patient may have a plan for committing suicide.  Mrs. Gunnoe assured CSW that patient does not endorse  feeling suicidal at this time; however, a referral has been made to [facility] by Daneen Schick, BSW, also with [facility].  Mrs. Peggye Ley also agreed to contact patient's Primary Care Physician, Dr. Alysia Penna to address concerns regarding patient's current mental status.  CSW will provide patient and Mrs. Blume with a list of counselors/therapists and encourage them to establish contact at their earliest convenience.  Mrs. Schreifels has CSW's contact information and has been encouraged to contact CSW if immediate social work needs arise or if patient expresses suicidal ideation.  [facility] CM Care Plan Problem One     Most Recent Value  Care Plan Problem One  Patient is experiencing signs and symptoms of depression.  Role Documenting the Problem One  Clinical Social Worker  Care Plan for Problem One  Active  [facility] Long Term Goal   Patient will have a reduction in symptoms of depression through counseling and supportive services, within the next 45 days.  [facility] Long Term Goal Start Date  07/08/18  Interventions for Problem One Long Term Goal  CSW will provide counseling and supportive services to patient to try and reduce symptoms of depression.  [facility] CM Short Term Goal #1   Patient and patient's wife will meet with CSW for an initial home visit so that CSW can assess needs, as well as provide counseling and supportive services to patient, within the next 30 days.  [facility] CM Short Term Goal #1 Start Date  07/08/18  Interventions for Short Term Goal #1  CSW has scheduled an initial home visit with patient and patient's wife for Wednesday, July 13, 2018 at [place] CM Short Term Goal #  2   Patient will practice deep breathing exercises and relaxation techniques to help cope with symptoms of depression, within the next 30 days.  [facility] CM Short Term Goal #2 Start Date  07/08/18  Interventions for Short Term Goal #2  CSW will teach patient and patient's wife deep breathing exercises and  relaxation techniques for patient to utilize when experiencing symptoms of depression.  [facility] CM Short Term Goal #3  Patient will decide on a counselor/therapist of choice and begin receiving counseling services within the next 30 days.  [facility] CM Short Term Goal #3 Start Date  07/08/18  Interventions for Short Tern Goal #3  CSW will provide patient and patient's wife with a list of counselors/therapists that accept [employer].     Nat Christen, BSW, MSW, LCSW  Licensed Education officer, environmental Health System  Mailing [place] [address] [place], [place] [address] Physical Address-[address], [place], [place] [address] Toll Free Main # [phone] Fax # [phone] Cell # [phone]  Office # [phone] [email or web address]

## 2018-07-09 ENCOUNTER — Other Ambulatory Visit: Payer: Self-pay | Admitting: Internal Medicine

## 2018-07-11 ENCOUNTER — Telehealth: Payer: Self-pay | Admitting: Pharmacist

## 2018-07-11 DIAGNOSIS — Z436 Encounter for attention to other artificial openings of urinary tract: Secondary | ICD-10-CM | POA: Diagnosis not present

## 2018-07-11 DIAGNOSIS — N390 Urinary tract infection, site not specified: Secondary | ICD-10-CM | POA: Diagnosis not present

## 2018-07-11 DIAGNOSIS — D696 Thrombocytopenia, unspecified: Secondary | ICD-10-CM | POA: Diagnosis not present

## 2018-07-11 DIAGNOSIS — D649 Anemia, unspecified: Secondary | ICD-10-CM | POA: Diagnosis not present

## 2018-07-11 DIAGNOSIS — I130 Hypertensive heart and chronic kidney disease with heart failure and stage 1 through stage 4 chronic kidney disease, or unspecified chronic kidney disease: Secondary | ICD-10-CM | POA: Diagnosis not present

## 2018-07-11 DIAGNOSIS — N183 Chronic kidney disease, stage 3 (moderate): Secondary | ICD-10-CM | POA: Diagnosis not present

## 2018-07-11 DIAGNOSIS — R410 Disorientation, unspecified: Secondary | ICD-10-CM | POA: Diagnosis not present

## 2018-07-11 DIAGNOSIS — T83022D Displacement of nephrostomy catheter, subsequent encounter: Secondary | ICD-10-CM | POA: Diagnosis not present

## 2018-07-11 DIAGNOSIS — C679 Malignant neoplasm of bladder, unspecified: Secondary | ICD-10-CM | POA: Diagnosis not present

## 2018-07-11 DIAGNOSIS — B964 Proteus (mirabilis) (morganii) as the cause of diseases classified elsewhere: Secondary | ICD-10-CM | POA: Diagnosis not present

## 2018-07-11 NOTE — Telephone Encounter (Signed)
-----   Message from Wyatt Portela, MD sent at 07/11/2018  7:16 AM EDT ----- Thanks. Quetiapine can be stopped then.  FS ----- Message ----- From: Elayne Guerin, [facility] Sent: 07/08/2018   5:25 PM To: Wyatt Portela, MD  Dr. [name],  Please see the attached [facility] Pharmacist's note. Patient and his wife were referred for medication review post discharge and because they were concerned that some of his medications could be causing his "nighttime episodes".  Although the patient is taking several medications that can cause CNS depression and mental status changes, his symptoms are most likely multifactorial.  Ms. Wenzlick questioned the need for both mirtazapine and Quetiapine but said she tried to just give Mr. Hupfer quetiapine but he was very agitated and she ended up giving him the mirtazapine and it was helpful.  Thank you so much for your time and consideration.  Blessings,  Elayne Guerin, PharmD, [place] Clinical Pharmacist [phone]

## 2018-07-11 NOTE — Patient Outreach (Signed)
[facility]) Care Management  07/11/2018  EARMON SHERROW 07-03-1935 [id number]   Called and spoke with the patient's wife [name]).  HIPAA identifiers were obtained.  Patient's reported they had a rough weekend with the patient's itching.  She reported the patient is not sleeping at night because he has intense episodes of itching.    Mrs. Hudnall was informed about the response back from Dr. [name] stating patient could discontinue Quetiapine at bedtime.  She was skeptical that discontinuing would make any difference as the patient had these intense itching episodes prior to being started on quetiapine.  She said she had been using the triamcinolone cream prescribed for the itching as well as moisturizer but nothing has been making a difference.  It was suggested that the she call Dr. Hazeline Junker office since Vevay has such a high incidence of itching.  Plan: Route note to Dr. [name]. Not sure if Hydroxyzine could be an option to help with itching.   Elayne Guerin, PharmD, [place] Clinical Pharmacist [phone]

## 2018-07-12 ENCOUNTER — Telehealth: Payer: Self-pay | Admitting: Oncology

## 2018-07-12 ENCOUNTER — Ambulatory Visit: Payer: Self-pay | Admitting: Pharmacist

## 2018-07-12 ENCOUNTER — Other Ambulatory Visit: Payer: Medicare HMO

## 2018-07-12 NOTE — Telephone Encounter (Signed)
Called pt re appts that we added per 7/15 sch msg - spoke w/ pt's wife re appts.

## 2018-07-13 ENCOUNTER — Other Ambulatory Visit: Payer: Self-pay | Admitting: *Deleted

## 2018-07-13 ENCOUNTER — Ambulatory Visit: Payer: Medicare HMO | Admitting: Neurology

## 2018-07-13 ENCOUNTER — Encounter

## 2018-07-13 NOTE — Patient Outreach (Signed)
[facility]) Care Management  07/13/2018  JADD GASIOR September 02, 1935 [id number]  CSW was able to meet with patient and patient's wife, Briton Sellman today to perform the initial home visit.  Patient appeared to be in good spirits today, having just gone to breakfast with a group of his friends.  Patient reported that his pain is minimal today, that he has only had to take one Percocet and no Oxycodone.  Patient admits that his pain is well under control when he takes his medications exactly as prescribed.  Mrs. Agena stated that patient see's Sandi Mealy, Physicians Assistant at the [facility], for pain management.  Patient and Mrs. Morones denied wanting CSW to make a Palliative Care Consult for patient at this time, indicating that they did not wish to have another physician involved with patient's care, as they are already overwhelmed with all the calls they receive and appointments that have to attend on patient's behalf.   Mrs. Chausse became tearful when talking about being able to manage patient's care.  Mrs. Jorge indicated that patient currently has two nephrostomy tubes coming out of each kidney and a colostomy bag that he carries around that needs to be emptied often.  Patient has Stage III Kidney Disease, as well as Stage IV Cancer.  Patient takes Beryle Flock (chemotherapy medication), which patient receives at the [facility] every three weeks via an infusion through his port-a-cath.  Patient admits to not sleeping well due to the constant itching that stems from the chemotherapy medication.  Patient and Mrs. Crays reported that they have "tried everything" to stop the itching but nothing is working. Patient also experiences constant Urinary Tract Infections, which also lead to patient's confusion.  Mrs. Munch believes that most of patient's confusion stems from all the medications that patient is currently on, as well as the fact that patient has  frequent urinary tract infections and a colostomy bag.  Mrs. Kerins admits that patient's confusion is definitely worse in the evenings. Mrs. Carmickle reported that patient has already been tested by a nephrologist, who indicated that patient "does not" have Dementia.  Mrs. Soffer indicated that patient tries to pull out his nephrostomy tubes when he becomes really confused, having already pulled them out twice before.  Security had to be called while patient was last hospitalized because patient became angry and belligerent when the tubes were being replaced.  Patient also appeared to be very paranoid, texting family members, telling them that their lives are currently under surveillance. Mrs. Fissel believes that patient may have a kidney infection because she has noticed blood in patient's urine.  Mrs. Basey was encouraged to notify patient's Primary Care Physician, Dr. Alysia Penna of patient's current symptoms.  In the meantime, Mrs. Owensby reported that she is making patient drink "tons of water".  Mrs. Raju indicated that [place] Falling had 14 frozen meals delivered to their home on July 12, 2018, which are all heart healthy.  Patient has lost a significant amount of weight over the last few months because patient reports not having an appetite or desire to eat.  Mrs. Enis is very concerned about patient's current mental status because patient is very confused, especially in the evenings, so she has hidden all of patient's guns and took the keys to patient's motorcycle. Patient and Mrs. Jablonowski have both been encouraged to attend the caregiver services offered through the [facility].  Patient and Mrs. Doane reported that they are already  taking advantage of counseling services through Dorathy Kinsman, Spiritual Counseling at the [facility], and feel very confident in calling upon her when they need counseling services.  Patient and Mrs. Maione also indicated that they will begin  attending support programs, when things begin to calm down a bit.  Patient enjoys walking through the gardens at the [facility] because he believes it is healing and therapeutic.  Patient stated, "I have tried deep breathing exercises, CBD Oils, relaxation techniques, medications, etc, but nothing seems to be combating my anxiety".  Patient is no longer taking his antianxiety medication (Lorazapam), but continues to take his antidepressant (Seroquel). Mrs. Churchwell admits to having a wonderful support system through family, friends, neighbors, Social research officer, government.  Mrs. Barco indicated that they have a lot of family coming to visit within the next month so she is looking forward to receiving some respite care and support.  Due to patient's confusion, patient has made some very bad financial decisions, which Mrs. Duce is now trying to "dig them out of".  Mrs. Shock reported that she just wants to have some normalcy in their lives again, as opposed to having to constantly run from one doctors appointment to the next. Patient is receiving a home health nurse through [place] to perform wound care and dressing changes, check patient's vitals, orders patient's supplies, etc. Patient and Mrs. Newstrom were unable to identify any social work specific needs at present, but encouraged CSW to follow-up with them in two weeks to "see where they are".  In the meantime, Patient and Mrs. Brinkmeyer agreed to contact Dorathy Kinsman to schedule a counseling session for the next time that patient is scheduled to be at the [facility] for his treatment.  Mrs. Gleed also indicated that she plans to attend a Caregiver Support Group, also offered through the [facility].  CSW will make contact with patient and Mrs. Istre again in two weeks, per their request.  CSW was able to ensure that patient and Mrs. Vanderberg have the correct contact information for CSW, encouraging them to contact CSW directly if  social work needs arise in the meantime.  [facility] CM Care Plan Problem One     Most Recent Value  Care Plan Problem One  Patient is experiencing signs and symptoms of depression.  Role Documenting the Problem One  Clinical Social Worker  Care Plan for Problem One  Active  [facility] Long Term Goal   Patient will have a reduction in symptoms of depression through counseling and supportive services, within the next 45 days.  [facility] Long Term Goal Start Date  07/08/18  [facility] CM Short Term Goal #1   Patient and patient's wife will meet with CSW for an initial home visit so that CSW can assess needs, as well as provide counseling and supportive services to patient, within the next 30 days.  [facility] CM Short Term Goal #1 Start Date  07/08/18  [facility] CM Short Term Goal #1 Met Date  07/13/18  Interventions for Short Term Goal #1  CSW met with patient and patient's wife to provide counseling and supportive services.  [facility] CM Short Term Goal #2   Patient will practice deep breathing exercises and relaxation techniques to help cope with symptoms of depression, within the next 30 days.  [facility] CM Short Term Goal #2 Start Date  07/08/18  [facility] CM Short Term Goal #2 Met Date  07/13/18  Interventions for Short Term Goal #  2  Patient reports that he is already practicing deep breathing exercises and that he will continue to do so when he becomes anxious and/or depressed.  [facility] CM Short Term Goal #3  Patient will decide on a counselor/therapist of choice and begin receiving counseling services within the next 30 days.  [facility] CM Short Term Goal #3 Start Date  07/08/18  [facility] CM Short Term Goal #3 Met Date  07/13/18  Interventions for Short Tern Goal #3  Patient has decided that he would like to continue to receive counseling and supportive services through the [place] [facility] Counselor.    Nat Christen, BSW, MSW, LCSW  Licensed Education officer, environmental Health System  Mailing  [place] [address], [place], [place] [address] Physical Address-[address], [place], [place] [address] Toll Free Main # [phone] Fax # [phone] Cell # [phone]  Office # [phone] [email or web address]

## 2018-07-14 ENCOUNTER — Other Ambulatory Visit: Payer: Self-pay | Admitting: Pharmacist

## 2018-07-14 DIAGNOSIS — N183 Chronic kidney disease, stage 3 (moderate): Secondary | ICD-10-CM | POA: Diagnosis not present

## 2018-07-14 DIAGNOSIS — Z436 Encounter for attention to other artificial openings of urinary tract: Secondary | ICD-10-CM | POA: Diagnosis not present

## 2018-07-14 DIAGNOSIS — I130 Hypertensive heart and chronic kidney disease with heart failure and stage 1 through stage 4 chronic kidney disease, or unspecified chronic kidney disease: Secondary | ICD-10-CM | POA: Diagnosis not present

## 2018-07-14 DIAGNOSIS — N390 Urinary tract infection, site not specified: Secondary | ICD-10-CM | POA: Diagnosis not present

## 2018-07-14 DIAGNOSIS — Z933 Colostomy status: Secondary | ICD-10-CM | POA: Diagnosis not present

## 2018-07-14 DIAGNOSIS — D649 Anemia, unspecified: Secondary | ICD-10-CM | POA: Diagnosis not present

## 2018-07-14 DIAGNOSIS — C679 Malignant neoplasm of bladder, unspecified: Secondary | ICD-10-CM | POA: Diagnosis not present

## 2018-07-14 DIAGNOSIS — T83022D Displacement of nephrostomy catheter, subsequent encounter: Secondary | ICD-10-CM | POA: Diagnosis not present

## 2018-07-14 DIAGNOSIS — K624 Stenosis of anus and rectum: Secondary | ICD-10-CM | POA: Diagnosis not present

## 2018-07-14 DIAGNOSIS — R410 Disorientation, unspecified: Secondary | ICD-10-CM | POA: Diagnosis not present

## 2018-07-14 DIAGNOSIS — B964 Proteus (mirabilis) (morganii) as the cause of diseases classified elsewhere: Secondary | ICD-10-CM | POA: Diagnosis not present

## 2018-07-14 DIAGNOSIS — D696 Thrombocytopenia, unspecified: Secondary | ICD-10-CM | POA: Diagnosis not present

## 2018-07-14 NOTE — Patient Outreach (Addendum)
[facility] ([facility]) Care Management  07/14/2018  Charles Coffey 10/05/1935 [id number]   Called and spoke to the patient's wife to follow up.  HIPAA identifiers were obtained. Patient's wife reported the patient had a much better night last night. She said she is no longer giving the patient mirtazapine and is only giving him Quetiapine at bedtime. She also shared that she put some capsasin cream on his body and he slept more than 4 hours without waking which is a huge improvement.  She wondered about the burning associated with Capsicin cream and was educated that the active ingredient comes from hot peppers. She was also cautioned about open lesions since the patient had been actively scratching.  When reviewing the patient's medications and looking at claims, it was discovered T[facility]odone [facility].  The patient is not currently taking Trazodone.  [facility] and asked [facility] Trazodone from automatic refill and return the prescription to stock as he is no longer taking it.  Plan: [facility]. Route note to Dr. Shadad to alert about mirtazapine being discontinued Close patient's case as the patient's pharmacy needs have been met.  Patient's wife has my contact information and communicated understanding that she can contact me at anytime in the future.      J. , PharmD, BCACP [facility] Clinical Pharmacist [phone]    

## 2018-07-15 ENCOUNTER — Encounter: Payer: Self-pay | Admitting: General Practice

## 2018-07-15 NOTE — Progress Notes (Signed)
[place] Spiritual Care Note  Reached spouse [name] by phone briefly for f/u caregiver support. Couple is expecting dear friends from [place] for breakfast shortly; they are hopeful to enjoy some quality time and connection in the midst of exhaustion and ongoing stress due to big picture of pt's health situation and lack of sleep due to pt's extreme itching. [name] welcomes calls and encouragement. We plan to f/u by phone at the beginning of next week and then for me to visit in infusion during [name]'s tx next Friday (7/26).   [place], [place], [facility] Pager [phone] Voicemail [phone]

## 2018-07-19 DIAGNOSIS — Z933 Colostomy status: Secondary | ICD-10-CM | POA: Diagnosis not present

## 2018-07-19 DIAGNOSIS — K624 Stenosis of anus and rectum: Secondary | ICD-10-CM | POA: Diagnosis not present

## 2018-07-21 ENCOUNTER — Encounter: Payer: Self-pay | Admitting: Family Medicine

## 2018-07-21 ENCOUNTER — Ambulatory Visit (INDEPENDENT_AMBULATORY_CARE_PROVIDER_SITE_OTHER): Payer: Medicare HMO | Admitting: Family Medicine

## 2018-07-21 VITALS — BP 105/71 | HR 65 | Temp 97.8°F | Wt 176.0 lb

## 2018-07-21 DIAGNOSIS — T83022A Displacement of nephrostomy catheter, initial encounter: Secondary | ICD-10-CM

## 2018-07-21 DIAGNOSIS — N390 Urinary tract infection, site not specified: Secondary | ICD-10-CM

## 2018-07-21 DIAGNOSIS — T83512S Infection and inflammatory reaction due to nephrostomy catheter, sequela: Secondary | ICD-10-CM

## 2018-07-21 DIAGNOSIS — A419 Sepsis, unspecified organism: Secondary | ICD-10-CM

## 2018-07-21 DIAGNOSIS — I10 Essential (primary) hypertension: Secondary | ICD-10-CM | POA: Diagnosis not present

## 2018-07-21 DIAGNOSIS — C679 Malignant neoplasm of bladder, unspecified: Secondary | ICD-10-CM | POA: Diagnosis not present

## 2018-07-21 DIAGNOSIS — L299 Pruritus, unspecified: Secondary | ICD-10-CM

## 2018-07-21 DIAGNOSIS — K439 Ventral hernia without obstruction or gangrene: Secondary | ICD-10-CM | POA: Insufficient documentation

## 2018-07-21 MED ORDER — HYDROXYZINE HCL 25 MG PO TABS: 25.0000 mg | ORAL_TABLET | ORAL | 2 refills | Status: DC | PRN

## 2018-07-21 NOTE — Progress Notes (Signed)
   Subjective:    Patient ID: Charles Coffey, male    DOB: 07-Jan-1935, 83 y.o.   MRN: [id number]  HPI  Here with his wife to follow up a hospital stay from 07-05-18 to 07-06-18 to replace both nephrostomy tubes. He is S/P surgery for stage 4 bladder cancer and he has bilateral nephrostomy tubes to bypass the bladder outlet obstruction. Unfortunately he has become confused on several occasions and has pulled out one or more of these tubes. They have been replaced again and now they are functioning quite well. He has seen Dr. [name] for Urologic care, and he continues to receive chemotherapy under the direction of Dr. [name]. He is getting Keytruda and this seems to be effective. According to a recent CT scan, the tumor seems to be shrinking in size somewhat. He is even able to pass a little urine here and there per urethra, and we hope the nephrostomy tubes can be permanently removed at some point. He main problem lately has been diffuse itching all over the body, particularly at night. There is no visible rash. This is a common side effect of Keytruda, so we think that is the explanation. He still has trouble sleeping and he was started on low dose Seroquel in the hospital. He is not sure if this helps with sleeping or not. He is seeing Sandi Mealy PA from the [facility] for pain management. He has developed a large ventral hernia around his ostomy site and he saw Dr. Barry Dienes about this. He has no pain at all and his bowels move normally. This would be a technically difficult hernia to repair surgically, so everyone agreed to simply observe it for now. He is getting home visits with social workers through [employer]    Review of Systems  Constitutional: Negative.   Respiratory: Negative.   Cardiovascular: Negative.   Gastrointestinal: Negative.   Genitourinary: Negative for dysuria, flank pain, frequency and hematuria.  Skin: Negative for rash.  Neurological: Negative.          Objective:   Physical Exam  Constitutional: He appears well-developed and well-nourished.  He looks good and he has gained a little weight.   Cardiovascular: Normal rate, regular rhythm, normal heart sounds and intact distal pulses.  Pulmonary/Chest: Effort normal and breath sounds normal. No stridor. No respiratory distress. He has no wheezes. He has no rales.  Abdominal: Soft. Bowel sounds are normal. He exhibits no distension. There is no tenderness. There is no rebound and no guarding.  There is a large non-reducible non-tender hernia around the abdominal stoma site   Skin: No rash noted.          Assessment & Plan:  He is responding well to chemotherapy for the bladder cancer, but he has a problem with itching. This is likely a side effect of Keytruda. He will try Hydroxyzine 25 mg every 4 hours prn itching. This should prove very useful at night because of its sedating qualities, and it may help him sleep better. He will have labs drawn tomorrow morning to prepare for another round of chemotherapy. His nephrostomy tubes are functioning well, and I hope he can avoid pulling them out again. The pneumonia and Proteus UTI he had last month are resolved. We will observe the ventral hernia for now since it is not bothering him.  Alysia Penna, MD

## 2018-07-22 ENCOUNTER — Telehealth: Payer: Self-pay | Admitting: Oncology

## 2018-07-22 ENCOUNTER — Inpatient Hospital Stay: Payer: Medicare HMO | Attending: Oncology | Admitting: Oncology

## 2018-07-22 ENCOUNTER — Inpatient Hospital Stay: Payer: Medicare HMO

## 2018-07-22 ENCOUNTER — Encounter: Payer: Self-pay | Admitting: General Practice

## 2018-07-22 DIAGNOSIS — R410 Disorientation, unspecified: Secondary | ICD-10-CM | POA: Diagnosis not present

## 2018-07-22 DIAGNOSIS — C786 Secondary malignant neoplasm of retroperitoneum and peritoneum: Secondary | ICD-10-CM | POA: Diagnosis not present

## 2018-07-22 DIAGNOSIS — Z936 Other artificial openings of urinary tract status: Secondary | ICD-10-CM | POA: Diagnosis not present

## 2018-07-22 DIAGNOSIS — C679 Malignant neoplasm of bladder, unspecified: Secondary | ICD-10-CM

## 2018-07-22 DIAGNOSIS — D638 Anemia in other chronic diseases classified elsewhere: Secondary | ICD-10-CM | POA: Insufficient documentation

## 2018-07-22 DIAGNOSIS — N1330 Unspecified hydronephrosis: Secondary | ICD-10-CM | POA: Insufficient documentation

## 2018-07-22 DIAGNOSIS — Z5112 Encounter for antineoplastic immunotherapy: Secondary | ICD-10-CM | POA: Insufficient documentation

## 2018-07-22 DIAGNOSIS — D6959 Other secondary thrombocytopenia: Secondary | ICD-10-CM | POA: Insufficient documentation

## 2018-07-22 DIAGNOSIS — L299 Pruritus, unspecified: Secondary | ICD-10-CM

## 2018-07-22 DIAGNOSIS — Z95828 Presence of other vascular implants and grafts: Secondary | ICD-10-CM

## 2018-07-22 DIAGNOSIS — R102 Pelvic and perineal pain: Secondary | ICD-10-CM | POA: Diagnosis not present

## 2018-07-22 DIAGNOSIS — Z7189 Other specified counseling: Secondary | ICD-10-CM | POA: Diagnosis not present

## 2018-07-22 LAB — CBC WITH DIFFERENTIAL (CANCER CENTER ONLY)
BASOS ABS: 0 10*3/uL (ref 0.0–0.1)
Basophils Relative: 0 %
EOS ABS: 0.6 10*3/uL — AB (ref 0.0–0.5)
EOS PCT: 13 %
HCT: 24.4 % — ABNORMAL LOW (ref 38.4–49.9)
Hemoglobin: 8.1 g/dL — ABNORMAL LOW (ref 13.0–17.1)
LYMPHS ABS: 1.6 10*3/uL (ref 0.9–3.3)
Lymphocytes Relative: 32 %
MCH: 32.4 pg (ref 27.2–33.4)
MCHC: 33.2 g/dL (ref 32.0–36.0)
MCV: 97.6 fL (ref 79.3–98.0)
Monocytes Absolute: 0.5 10*3/uL (ref 0.1–0.9)
Monocytes Relative: 10 %
Neutro Abs: 2.2 10*3/uL (ref 1.5–6.5)
Neutrophils Relative %: 45 %
Platelet Count: 64 10*3/uL — ABNORMAL LOW (ref 140–400)
RBC: 2.5 MIL/uL — AB (ref 4.20–5.82)
RDW: 16.1 % — ABNORMAL HIGH (ref 11.0–14.6)
WBC: 4.9 10*3/uL (ref 4.0–10.3)

## 2018-07-22 LAB — CMP (CANCER CENTER ONLY)
ALT: 12 U/L (ref 0–44)
AST: 16 U/L (ref 15–41)
Albumin: 3.9 g/dL (ref 3.5–5.0)
Alkaline Phosphatase: 57 U/L (ref 38–126)
Anion gap: 6 (ref 5–15)
BUN: 28 mg/dL — ABNORMAL HIGH (ref 8–23)
CHLORIDE: 113 mmol/L — AB (ref 98–111)
CO2: 18 mmol/L — AB (ref 22–32)
CREATININE: 1.7 mg/dL — AB (ref 0.61–1.24)
Calcium: 9.6 mg/dL (ref 8.9–10.3)
GFR, EST AFRICAN AMERICAN: 42 mL/min — AB (ref >60)
GFR, Estimated: 36 mL/min — ABNORMAL LOW (ref >60)
Glucose, Bld: 92 mg/dL (ref 70–99)
Potassium: 4.3 mmol/L (ref 3.5–5.1)
SODIUM: 137 mmol/L (ref 135–145)
Total Bilirubin: 0.3 mg/dL (ref 0.3–1.2)
Total Protein: 7.1 g/dL (ref 6.5–8.1)

## 2018-07-22 MED ORDER — HEPARIN SOD (PORK) LOCK FLUSH 100 UNIT/ML IV SOLN
500.0000 [IU] | Freq: Once | INTRAVENOUS | Status: AC | PRN
  Administered 2018-07-22: 500 [IU]
  Filled 2018-07-22: qty 5

## 2018-07-22 MED ORDER — PEGFILGRASTIM INJECTION 6 MG/0.6ML ~~LOC~~: 6.0000 mg | PREFILLED_SYRINGE | Freq: Once | SUBCUTANEOUS | Status: DC

## 2018-07-22 MED ORDER — SODIUM CHLORIDE 0.9 % IV SOLN
200.0000 mg | Freq: Once | INTRAVENOUS | Status: AC
  Administered 2018-07-22: 200 mg via INTRAVENOUS
  Filled 2018-07-22: qty 8

## 2018-07-22 MED ORDER — SODIUM CHLORIDE 0.9 % IV SOLN
Freq: Once | INTRAVENOUS | Status: AC
  Administered 2018-07-22: 14:00:00 via INTRAVENOUS
  Filled 2018-07-22: qty 250

## 2018-07-22 MED ORDER — SODIUM CHLORIDE 0.9% FLUSH
10.0000 mL | INTRAVENOUS | Status: DC | PRN
  Administered 2018-07-22: 10 mL
  Filled 2018-07-22: qty 10

## 2018-07-22 MED ORDER — SODIUM CHLORIDE 0.9% FLUSH
10.0000 mL | Freq: Once | INTRAVENOUS | Status: DC
  Filled 2018-07-22: qty 10

## 2018-07-22 MED ORDER — OXYCODONE HCL ER 10 MG PO T12A: 10.0000 mg | EXTENDED_RELEASE_TABLET | Freq: Two times a day (BID) | ORAL | 0 refills | Status: DC

## 2018-07-22 MED ORDER — OXYCODONE-ACETAMINOPHEN 10-325 MG PO TABS: 1.0000 | ORAL_TABLET | ORAL | 0 refills | Status: DC | PRN

## 2018-07-22 NOTE — Telephone Encounter (Signed)
Appointments scheduled AVs/Calendar printed per 7/26 los

## 2018-07-22 NOTE — Patient Instructions (Signed)
[facility] Discharge Instructions for Patients Receiving Chemotherapy  Today you received the following chemotherapy agents keytruda   To help prevent nausea and vomiting after your treatment, we encourage you to take your nausea medication as directed  If you develop nausea and vomiting that is not controlled by your nausea medication, call the clinic.   BELOW ARE SYMPTOMS THAT SHOULD BE REPORTED IMMEDIATELY:  *FEVER GREATER THAN 100.5 F  *CHILLS WITH OR WITHOUT FEVER  NAUSEA AND VOMITING THAT IS NOT CONTROLLED WITH YOUR NAUSEA MEDICATION  *UNUSUAL SHORTNESS OF BREATH  *UNUSUAL BRUISING OR BLEEDING  TENDERNESS IN MOUTH AND THROAT WITH OR WITHOUT PRESENCE OF ULCERS  *URINARY PROBLEMS  *BOWEL PROBLEMS  UNUSUAL RASH Items with * indicate a potential emergency and should be followed up as soon as possible.  Feel free to call the clinic you have any questions or concerns. The clinic phone number is [phone].  

## 2018-07-22 NOTE — Progress Notes (Signed)
Hematology and Oncology Follow Up Visit  COYLE STORDAHL [id number] March 26, 1935 83 y.o. 07/22/2018 1:54 PM [name], MDFry, [name], MD   Principle Diagnosis: 83 year old man with stage IV high-grade urothelial carcinoma arising from the bladder and the genitourinary tract with lymphadenopathy diagnosed in November 2018. PDL 1 testing is positive with 25% CPS     Prior Therapy:  He is status post cystoscopy and transurethral resection of a bladder tumor and on 11/25/2017.  He is status post bilateral nephrostomy tube placement in November 2018.  He is status post diverting colostomy done on 12/01/2017 performed by Dr. Barry Dienes due to colonic obstruction.  Chemotherapy utilizing gemcitabine and carboplatin.  Day 1 of cycle 1 was given on December 30, 2017.  He completed 2 cycles of therapy.  Therapy discontinued based on his wishes to poor tolerance.  Current therapy: Pembrolizumab 200 mg every 3 weeks started on March 23, 2018.  He completed 3 cycles of therapy.  Interim History: Mr. Benegas is here for a follow-up.  Since the last visit, he was hospitalized in the early part of July and was discharged on 07/06/2018.  He had issues with urosepsis and confusion which has resolved at this time.  He had his percutaneous nephrostomy tube replaced and draining less at this time.  He is noting increased urine output via the penis.  He denies any fevers or chills.  He denies any weight loss.  His pain has been reasonably controlled using OxyContin at nighttime and oxycodone as needed very infrequently.  He is nighttime confusion has improved on the Seroquel.  He continues to have issues with pruritus related to Pembrolizumab and has been prescribed hydroxyzine which he has not used.  He denies any complications related to this medication otherwise.  He denies any excessive fatigue or tiredness.  He denies any diarrhea or respiratory complaints.   He does not report any headaches, blurry vision,  syncope or seizures.  He does report episodic confusion but no agitation.  He does not report any fevers, chills, sweats. He does not report any chest pain, palpitation, orthopnea. .  He denies any hemoptysis or wheezing.  He does not report any nausea, vomiting or abdominal pain.  He denies any flank pain frequency.  He denies any dysuria or hematuria.  He does not report any arthralgias or myalgias.  He does not report any skin rashes or lesions.  He denied any petechiae. He does not report any lymphadenopathy. Remaining review of systems is negative.  Medications: I have reviewed the patient's medication and discussed with the patient personally today. Current Outpatient Medications  Medication Sig Dispense Refill  . acetaminophen (TYLENOL) 500 MG tablet Take 1,000 mg by mouth every 8 (eight) hours as needed for mild pain or moderate pain.    . capsaicin (ZOSTRIX) 0.025 % cream Apply topically 2 (two) times daily.    . carvedilol (COREG) 3.125 MG tablet Take 1 tablet (3.125 mg total) by mouth 2 (two) times daily. (Patient taking differently: Take 1.563 mg by mouth 2 (two) times daily. ) 60 tablet 0  . dicyclomine (BENTYL) 10 MG capsule TAKE 1-2 BY MOUTH EVERY 6 HOURS AS NEEDED FOR RECTAL SPASMS. (Patient not taking: Reported on 07/21/2018) 90 capsule 2  . feeding supplement (BOOST HIGH PROTEIN) LIQD Take 1 Container by mouth 2 (two) times daily between meals.    . furosemide (LASIX) 40 MG tablet Take 1 tablet (40 mg total) by mouth 2 (two) times daily. (Patient taking differently:  Take 40 mg by mouth as needed for fluid or edema. ) 180 tablet 1  . guaiFENesin-codeine 100-10 MG/5ML syrup Take 5 mLs by mouth every 6 (six) hours as needed for cough. (Patient not taking: Reported on 07/21/2018) 180 mL 0  . hydrOXYzine (ATARAX/VISTARIL) 25 MG tablet Take 1 tablet (25 mg total) by mouth every 4 (four) hours as needed for itching. 100 tablet 2  . lidocaine-prilocaine (EMLA) cream Apply 1 application topically  as needed. 30 g 0  . LINZESS 290 MCG CAPS capsule TAKE 1 CAP BY MOUTH ONCE DAILY 30 MIN PRIOR TO A MEAL. MUST HAVE OFFICE VISIT FOR FURTHER REFILLS 30 capsule 3  . megestrol (MEGACE) 40 MG/ML suspension TAKE 10ML BY MOUTH TWICE A DAY (Patient taking differently: TAKE 10ML BY MOUTH TWICE A DAY as needed) 240 mL 0  . mirabegron ER (MYRBETRIQ) 25 MG TB24 tablet Take 1 tablet (25 mg total) by mouth daily. 30 tablet 0  . mupirocin ointment (BACTROBAN) 2 % Place 1 application into the nose 2 (two) times daily.    . nitroGLYCERIN (NITROSTAT) 0.4 MG SL tablet Place 1 tablet (0.4 mg total) under the tongue every 5 (five) minutes as needed. 25 tablet 3  . oxyCODONE (OXYCONTIN) 10 mg 12 hr tablet Take 1 tablet (10 mg total) by mouth 2 (two) times daily. 60 tablet 0  . oxyCODONE-acetaminophen (PERCOCET) 10-325 MG tablet Take 1 tablet by mouth every 4 (four) hours as needed for pain. 60 tablet 0  . pembrolizumab (KEYTRUDA) 100 MG/4ML SOLN Given at infusion center every 3 weeks    . pramipexole (MIRAPEX) 1 MG tablet TAKE 1 AND 1/2 TABLETS BY MOUTH AT BEDTIME 135 tablet 3  . prochlorperazine (COMPAZINE) 10 MG tablet Take 1 tablet (10 mg total) by mouth every 6 (six) hours as needed for nausea or vomiting. 30 tablet 0  . QUEtiapine (SEROQUEL) 25 MG tablet Take 1 tablet (25 mg total) by mouth at bedtime. 30 tablet 0  . ranitidine (ZANTAC) 300 MG tablet TAKE 1 TABLET BY MOUTH TWICE A DAY 60 tablet 1  . Tetrahydroz-Glyc-Hyprom-PEG (VISINE MAXIMUM REDNESS RELIEF OP) Place 2 drops into both eyes 2 (two) times daily as needed (dry eyes).     . triamcinolone cream (KENALOG) 0.1 % Apply topically 3 (three) times daily. To affected areas (Patient not taking: Reported on 07/21/2018) 30 g 0   No current facility-administered medications for this visit.      Allergies:  Allergies  Allergen Reactions  . Antihistamines, Loratadine-Type Other (See Comments)    Unable to urinate  . Statins Other (See Comments)    liver  effects    Past Medical History, Surgical history, Social history, and Family History reviewed today and remain unchanged.   Physical Exam: Blood pressure 110/69, pulse 71, temperature 98.2 F (36.8 C), temperature source Oral, resp. rate 17, height 5\' 8"  (1.727 m), weight 176 lb 9.6 oz (80.1 kg), SpO2 100 %.   ECOG: 1 General appearance: Alert, awake woman without distress. Head: Normocephalic without abnormalities.. Oral mucosa: No oral thrush or ulcers. Eyes: Pupils are equal and round reactive to light. Lymph nodes: No  cervical, supraclavicular, inguinal or axillary adenopathy Heart: Regular rate without any murmurs.  S1-S2 without leg edema. Lung: Clear without any rhonchi, wheezes or dullness to percussion. Abdomin: Soft, without any rebound or guarding.  No shifting dullness or ascites. Musculoskeletal: No clubbing or cyanosis. Skin: No ecchymosis or petechiae. Psychiatric: No changes in his mood appreciated today. Neurological: No  motor or sensory deficits noted.  Lab Results: Lab Results  Component Value Date   WBC 4.9 07/22/2018   HGB 8.1 (L) 07/22/2018   HCT 24.4 (L) 07/22/2018   MCV 97.6 07/22/2018   PLT 64 (L) 07/22/2018     Chemistry      Component Value Date/Time   NA 137 07/22/2018 1226   NA 138 02/24/2018 1646   NA 133 (L) 12/29/2017 1014   K 4.3 07/22/2018 1226   K 4.4 12/29/2017 1014   CL 113 (H) 07/22/2018 1226   CO2 18 (L) 07/22/2018 1226   CO2 20 (L) 12/29/2017 1014   BUN 28 (H) 07/22/2018 1226   BUN 25 02/24/2018 1646   BUN 31.5 (H) 12/29/2017 1014   CREATININE 1.70 (H) 07/22/2018 1226   CREATININE 1.6 (H) 12/29/2017 1014      Component Value Date/Time   CALCIUM 9.6 07/22/2018 1226   CALCIUM 9.6 12/29/2017 1014   ALKPHOS 57 07/22/2018 1226   ALKPHOS 61 12/29/2017 1014   AST 16 07/22/2018 1226   AST 15 12/29/2017 1014   ALT 12 07/22/2018 1226   ALT 13 12/29/2017 1014   BILITOT 0.3 07/22/2018 1226   BILITOT 0.43 12/29/2017 1014      EXAM: CT CHEST, ABDOMEN AND PELVIS WITHOUT CONTRAST  TECHNIQUE: Multidetector CT imaging of the chest, abdomen and pelvis was performed following the standard protocol without IV contrast.  COMPARISON:  CT chest from 02/28/18 and CT AP from 07/05/18  FINDINGS: CT CHEST FINDINGS  Cardiovascular: Previous median sternotomy and CABG procedure. Aortic atherosclerosis noted. No pericardial effusion.  Mediastinum/Nodes: Normal appearance of the thyroid gland. The trachea appears patent and is midline. Normal appearance of the esophagus.  No enlarged mediastinal or hilar lymph nodes.  Lungs/Pleura: No pleural effusion. No airspace consolidation, atelectasis or pneumothorax identified. Mild diffuse peripheral and lower lobe predominant interstitial reticulation identified. Mild traction bronchiectasis identified. A few scattered solid nodules are noted in both lungs. Pulmonary nodule within the posterior left lung base measures 5 mm, image 132/6. Unchanged from previous exam. Calcified granuloma identified within the subpleural right middle lobe. Perifissural nodule along the minor fissure is stable measuring 4 mm. 2 mm nodule in the left upper lobe is unchanged, image 53/6. No new suspicious nodules identified.  Musculoskeletal: There are no aggressive lytic or sclerotic bone lesions identified.  CT ABDOMEN PELVIS FINDINGS  Hepatobiliary: No focal liver abnormality is seen. No gallstones, gallbladder wall thickening, or biliary dilatation.  Pancreas: Unremarkable. No pancreatic ductal dilatation or surrounding inflammatory changes.  Spleen: Normal in size without focal abnormality.  Adrenals/Urinary Tract: Normal adrenal glands. Bilateral percutaneous nephrostomy tubes are identified. There is been removal of previous right-sided ureteral stent. Again noted is asymmetric increased soft tissue involving the right renal pelvis and right ureter, image 72/2.  Stable in appearance to previous exam. Chronic asymmetric atrophy of the left kidney. Similar appearance of asymmetric soft tissue involving the left posterior bladder wall and distal left ureter, image 108/2.  Stomach/Bowel: The stomach appears normal. No pathologic dilatation of the small or large bowel loops. Left sided colostomy noted.  Vascular/Lymphatic: Aortic atherosclerosis. Similar appearance of mild soft tissue nodularity within the left retroperitoneum between the aorta and left ureter. No measurable adenopathy identified within the abdomen or pelvis.  Reproductive: Stable prostate gland enlargement.  Other: No free fluid or fluid collections. Left inguinal hernia noted containing fat only.  Musculoskeletal: Spondylosis identified within the lumbar spine. No aggressive lytic or sclerotic bone lesions. Status  post posterior hardware fixation of L3-4 with solid fusion of the disc space. Bilateral L5 pars defects are noted with anterolisthesis of L5 on S1.  IMPRESSION: 1. Stable exam. Small bilateral pulmonary nodules are unchanged in the interval. The asymmetric soft tissue within the left posterior bladder base involving the distal left ureter is unchanged. The mild soft tissue nodularity along the course of the left ureter is stable in the interval. 2. No new or progressive disease identified. 3. Bilateral nephroureteral stents are in place without evidence for hydronephrosis. 4. Aortic atherosclerosis and coronary artery calcifications. Aortic Atherosclerosis (ICD10-I70.0).   83 year old man with:   1.  Stage IV high-grade urothelial carcinoma arising from the bladder in the upper genitourinary tract with retroperitoneal lymph nodes diagnosed in 2018.  He has completed 3 cycles of Pembrolizumab with reasonable response to therapy.  CT scan on 07/07/2018 showed no progression of disease and overall stabilization.  Risks and benefits of continuing this  therapy was reviewed today and is agreeable to continue.  Plan is to continue at least forced total of 6 cycles..   2.  Bilateral hydronephrosis: This persist on the last CT scan and nephrostomy tubes in place.  Kidney function appears stable.  3.  Thrombocytopenia: Related to chemotherapy and appears to be improving without any active bleeding.  4.  IV access: No issues reported related to his Port-A-Cath.  This remains in use at this time  5.  Pelvic pain: Prescription for OxyContin and oxycodone was refilled today.  6.  Altered mental status: Much improved and Seroquel.  7.  Anemia: Related to chronic disease multifactorial in nature.  Hemoglobin is improved.  8.  Anorexia: Appetite continues to improve and gain weight.  9.  Prognosis and goals of care: His performance status remains reasonable and aggressive therapy is warranted.  10.  Pruritus: This is related to Pembrolizumab.  He is prescribed hydroxyzine but he has not tried it yet.  I urged him to use this medication as needed and will consider other options if it is not successful.  11.  Follow-up: In 4 weeks to follow his progress.  25 minutes was spent today with the patient face-to-face.  More than 50% was spent today on education, counseling and reviewing imaging studies and discussing future plan of care.  Zola Button, MD 7/26/20191:54 PM

## 2018-07-22 NOTE — Progress Notes (Signed)
Ok to treat with 07/22/18 lab results per Dr. [name].

## 2018-07-22 NOTE — Progress Notes (Signed)
[place] Spiritual Care Note  Followed up with [name] and [name] in infusion. They are still struggling with [name]'s itching and night-time confusion, causing sleep deprivation. Financial concerns are an additional stressor. They are doing their best to find joy every day, including from many out-of-town visitors. This weekend [name]'s son and grandchildren (56, 64) are visiting, and they hope for fun adventures together. Another excitement is that [name] has had a play accepted for publication. Focusing on positives helps them cope and find meaning in the midst of ongoing struggle.  Following for support, and [name] reaches out as needed, but please also page if circumstances change or urgent needs arise. Thank you!   [place], [place], [facility] Pager [phone] Voicemail [phone]

## 2018-07-25 ENCOUNTER — Telehealth: Payer: Self-pay | Admitting: *Deleted

## 2018-07-25 ENCOUNTER — Other Ambulatory Visit (HOSPITAL_COMMUNITY): Payer: Medicare HMO

## 2018-07-25 NOTE — Telephone Encounter (Signed)
Prior auth for Hydroxyzine 25mg  sent to [email or web address]-key [id number].

## 2018-07-26 DIAGNOSIS — I130 Hypertensive heart and chronic kidney disease with heart failure and stage 1 through stage 4 chronic kidney disease, or unspecified chronic kidney disease: Secondary | ICD-10-CM | POA: Diagnosis not present

## 2018-07-26 DIAGNOSIS — D696 Thrombocytopenia, unspecified: Secondary | ICD-10-CM | POA: Diagnosis not present

## 2018-07-26 DIAGNOSIS — N183 Chronic kidney disease, stage 3 (moderate): Secondary | ICD-10-CM | POA: Diagnosis not present

## 2018-07-26 DIAGNOSIS — Z436 Encounter for attention to other artificial openings of urinary tract: Secondary | ICD-10-CM | POA: Diagnosis not present

## 2018-07-26 DIAGNOSIS — D649 Anemia, unspecified: Secondary | ICD-10-CM | POA: Diagnosis not present

## 2018-07-26 DIAGNOSIS — C679 Malignant neoplasm of bladder, unspecified: Secondary | ICD-10-CM | POA: Diagnosis not present

## 2018-07-26 DIAGNOSIS — R410 Disorientation, unspecified: Secondary | ICD-10-CM | POA: Diagnosis not present

## 2018-07-26 DIAGNOSIS — B964 Proteus (mirabilis) (morganii) as the cause of diseases classified elsewhere: Secondary | ICD-10-CM | POA: Diagnosis not present

## 2018-07-26 DIAGNOSIS — N390 Urinary tract infection, site not specified: Secondary | ICD-10-CM | POA: Diagnosis not present

## 2018-07-26 DIAGNOSIS — T83022D Displacement of nephrostomy catheter, subsequent encounter: Secondary | ICD-10-CM | POA: Diagnosis not present

## 2018-07-27 ENCOUNTER — Encounter: Payer: Self-pay | Admitting: *Deleted

## 2018-07-27 ENCOUNTER — Other Ambulatory Visit: Payer: Self-pay | Admitting: *Deleted

## 2018-07-27 NOTE — Patient Outreach (Signed)
[facility]) Care Management  07/27/2018  ALDRIN ENGELHARD 08-Sep-1935 [id number]   CSW made an attempt to try and contact patient's wife, Hermen Mario today to follow-up regarding social work services and resources for patient; however, no one was available at the time of CSW's call.  A HIPAA compliant message was left on voicemail for Mrs. Merida and CSW is currently awaiting a return call.  CSW will make a second outreach attempt within the next 3-4 business days, if a return call is not received from Mrs. Kohan in the meantime.  CSW will also mail an Outreach Letter to patient's home, encouraging Mrs. Spinner to contact CSW if she is interested in continuing to receive social work services through [place] with Scientist, clinical (histocompatibility and immunogenetics). Nat Christen, BSW, MSW, LCSW  Licensed Education officer, [facility]  Mailing [place] [address], [place], [place] [address] Physical Address-[address], [place], [place] [address] Toll Free Main # [phone] Fax # [phone] Cell # [phone]  Office # [phone] [email or web address]

## 2018-07-28 NOTE — Telephone Encounter (Signed)
Fax received from [facility] stating the request was approved.  I called [facility] and informed Nada Boozer of this.

## 2018-08-01 ENCOUNTER — Encounter: Payer: Self-pay | Admitting: Oncology

## 2018-08-02 ENCOUNTER — Other Ambulatory Visit: Payer: Self-pay | Admitting: *Deleted

## 2018-08-02 ENCOUNTER — Encounter: Payer: Self-pay | Admitting: *Deleted

## 2018-08-02 NOTE — Patient Outreach (Signed)
[facility]) Care Management  08/02/2018  Charles Coffey 1935-10-30 [id number]  CSW was able to make contact with patient's wife, Bartley Vuolo today to follow-up regarding social work services and resources for patient.  Mrs. Doshi admits that patient is "not in a good place", but reports that it is only because patient is still not able to sleep well at night due to all the itching.  Mrs. Thompson indicated that they have already spent a small fortune on various medications to try and stop the itching, but nothing appears to be working.  Mrs. Guajardo reported that this is a major source of anxiety for her and the patient, as neither of them are getting more than a few hours of sleep per night.  Mrs. Moctezuma indicated that she plans to speak with all of patient's attending physician's to see what else can be done about patient's constant need to scratch, as patient has already developed a pretty bad rash from all the itching.  Mrs. Esselman became tearful while talking with CSW on the phone.  CSW offered counseling and supportive services, where appropriate.  Mrs. Jacinto admits that she has a Wellsite geologist with [facility] stopping by her home today to provide her with a list of approved providers that offer counseling services, as she would really like for patient to get involved with counseling services.    CSW inquired as to how CSW could be of further assistance to patient and Mrs. Bednarski at this time.  Mrs. Go denied being able to identify any additional social work needs at present, but took down CSW's contact information and agreed to contact CSW directly if anything changes or if additional social work needs arise in the near future.  Mrs. Niemczyk did not wish to have CSW contact her again, as Mrs. Maye admits to getting confused with all the services and providers that patient is currently receiving in the home.   CSW will perform a case closure on patient, as all goals of  treatment have been met from social work standpoint and no additional social work needs have been identified at this time.  CSW will notify patient's RNCM with [place] [facility], Imelda Pillow of CSW's plans to close patient's case.  CSW will fax an update to patient's Primary Care Physician, Dr. Alysia Penna to ensure that they are aware of CSW's involvement with patient's plan of care.  [facility] CM Care Plan Problem One     Most Recent Value  Care Plan Problem One  Patient is experiencing signs and symptoms of depression.  Role Documenting the Problem One  Clinical Social Worker  Care Plan for Problem One  Active  [facility] Long Term Goal   Patient will have a reduction in symptoms of depression through counseling and supportive services, within the next 45 days.  [facility] Long Term Goal Start Date  07/08/18  [facility] Long Term Goal Met Date  08/02/18  Interventions for Problem One Long Term Goal  Patient has decided to receive counseling services through a representative with [employer].  [facility] CM Short Term Goal #1   Patient and patient's wife will meet with CSW for an initial home visit so that CSW can assess needs, as well as provide counseling and supportive services to patient, within the next 30 days.  [facility] CM Short Term Goal #1 Start Date  07/08/18  [facility] CM Short Term Goal #1 Met Date  07/13/18  [facility] CM Short Term Goal #2   Patient  will practice deep breathing exercises and relaxation techniques to help cope with symptoms of depression, within the next 30 days.  [facility] CM Short Term Goal #2 Start Date  07/08/18  [facility] CM Short Term Goal #2 Met Date  07/13/18  [facility] CM Short Term Goal #3  Patient will decide on a counselor/therapist of choice and begin receiving counseling services within the next 30 days.  [facility] CM Short Term Goal #3 Start Date  07/08/18  [facility] CM Short Term Goal #3 Met Date  07/13/18       Nat Christen, BSW, MSW, [place]  Licensed Clinical Social Worker  [place]  Mailing [place]. [address] [place], [place] [address] Physical Address-[address], [place], [place] [address] Toll Free Main # [phone] Fax # [phone] Cell # [phone]  Office # [phone] [email or web address]

## 2018-08-03 ENCOUNTER — Other Ambulatory Visit: Payer: Self-pay | Admitting: Family Medicine

## 2018-08-03 DIAGNOSIS — D649 Anemia, unspecified: Secondary | ICD-10-CM | POA: Diagnosis not present

## 2018-08-03 DIAGNOSIS — D696 Thrombocytopenia, unspecified: Secondary | ICD-10-CM | POA: Diagnosis not present

## 2018-08-03 DIAGNOSIS — N183 Chronic kidney disease, stage 3 (moderate): Secondary | ICD-10-CM | POA: Diagnosis not present

## 2018-08-03 DIAGNOSIS — C679 Malignant neoplasm of bladder, unspecified: Secondary | ICD-10-CM | POA: Diagnosis not present

## 2018-08-03 DIAGNOSIS — N390 Urinary tract infection, site not specified: Secondary | ICD-10-CM | POA: Diagnosis not present

## 2018-08-03 DIAGNOSIS — R410 Disorientation, unspecified: Secondary | ICD-10-CM | POA: Diagnosis not present

## 2018-08-03 DIAGNOSIS — B964 Proteus (mirabilis) (morganii) as the cause of diseases classified elsewhere: Secondary | ICD-10-CM | POA: Diagnosis not present

## 2018-08-03 DIAGNOSIS — T83022D Displacement of nephrostomy catheter, subsequent encounter: Secondary | ICD-10-CM | POA: Diagnosis not present

## 2018-08-03 DIAGNOSIS — Z436 Encounter for attention to other artificial openings of urinary tract: Secondary | ICD-10-CM | POA: Diagnosis not present

## 2018-08-03 DIAGNOSIS — I130 Hypertensive heart and chronic kidney disease with heart failure and stage 1 through stage 4 chronic kidney disease, or unspecified chronic kidney disease: Secondary | ICD-10-CM | POA: Diagnosis not present

## 2018-08-04 DIAGNOSIS — C662 Malignant neoplasm of left ureter: Secondary | ICD-10-CM | POA: Diagnosis not present

## 2018-08-04 DIAGNOSIS — N130 Hydronephrosis with ureteropelvic junction obstruction: Secondary | ICD-10-CM | POA: Diagnosis not present

## 2018-08-04 DIAGNOSIS — R3915 Urgency of urination: Secondary | ICD-10-CM | POA: Diagnosis not present

## 2018-08-04 DIAGNOSIS — R3914 Feeling of incomplete bladder emptying: Secondary | ICD-10-CM | POA: Diagnosis not present

## 2018-08-07 ENCOUNTER — Telehealth: Payer: Self-pay | Admitting: Hematology

## 2018-08-07 MED ORDER — AMOXICILLIN-POT CLAVULANATE 875-125 MG PO TABS: 1.0000 | ORAL_TABLET | Freq: Two times a day (BID) | ORAL | 0 refills | Status: DC

## 2018-08-07 NOTE — Telephone Encounter (Signed)
Pt called in and reported cloudy urine, fever 100.4 and mild confusion this morning, and the above symptoms resolved this afternoon. He does not want to go to ED, and wonder what he can do. Chart reviewed, he had PROTEUS MIRABILIS UTI one month ago, which is resistant to multiple antibiotics, but sensitive to augmentin. He has CKD stage III. I will call in Augmentin 875mg  bid for 5 days, and inform Dr. [name] to see if pt needs coming in tomorrow or urine test and visit. I spoke with pt's wife today.   Truitt Merle  08/07/2018

## 2018-08-09 ENCOUNTER — Other Ambulatory Visit (HOSPITAL_COMMUNITY): Payer: Self-pay | Admitting: Interventional Radiology

## 2018-08-09 ENCOUNTER — Ambulatory Visit (HOSPITAL_COMMUNITY)
Admit: 2018-08-09 | Discharge: 2018-08-09 | Disposition: A | Discharge status: Home / Self Care | Payer: Medicare HMO | Attending: Interventional Radiology | Admitting: Interventional Radiology

## 2018-08-09 ENCOUNTER — Encounter (HOSPITAL_COMMUNITY): Payer: Self-pay

## 2018-08-09 ENCOUNTER — Ambulatory Visit (HOSPITAL_COMMUNITY)
Admit: 2018-08-09 | Discharge: 2018-08-09 | Disposition: A | Discharge status: Home / Self Care | Payer: Medicare HMO | Source: Ambulatory Visit | Attending: Interventional Radiology | Admitting: Interventional Radiology

## 2018-08-09 ENCOUNTER — Other Ambulatory Visit: Payer: Self-pay

## 2018-08-09 DIAGNOSIS — C679 Malignant neoplasm of bladder, unspecified: Secondary | ICD-10-CM

## 2018-08-09 DIAGNOSIS — Z436 Encounter for attention to other artificial openings of urinary tract: Secondary | ICD-10-CM | POA: Insufficient documentation

## 2018-08-09 HISTORY — PX: IR NEPHROSTOMY EXCHANGE LEFT: IMG6069

## 2018-08-09 HISTORY — PX: IR NEPHROSTOMY EXCHANGE RIGHT: IMG6070

## 2018-08-09 MED ORDER — LIDOCAINE HCL (PF) 1 % IJ SOLN
INTRAMUSCULAR | Status: AC | PRN
  Administered 2018-08-09: 5 mL

## 2018-08-09 MED ORDER — MIDAZOLAM HCL 2 MG/2ML IJ SOLN
INTRAMUSCULAR | Status: AC
  Filled 2018-08-09: qty 2

## 2018-08-09 MED ORDER — IOPAMIDOL (ISOVUE-300) INJECTION 61%
INTRAVENOUS | Status: AC
  Administered 2018-08-09: 20 mL
  Filled 2018-08-09: qty 50

## 2018-08-09 MED ORDER — IOPAMIDOL (ISOVUE-300) INJECTION 61%
50.0000 mL | Freq: Once | INTRAVENOUS | Status: AC | PRN
  Administered 2018-08-09: 20 mL

## 2018-08-09 MED ORDER — SODIUM CHLORIDE 0.9 % IV SOLN
INTRAVENOUS | Status: DC
  Administered 2018-08-09: 12:00:00 via INTRAVENOUS

## 2018-08-09 MED ORDER — FENTANYL CITRATE (PF) 100 MCG/2ML IJ SOLN
INTRAMUSCULAR | Status: AC
  Filled 2018-08-09: qty 2

## 2018-08-09 MED ORDER — LIDOCAINE HCL 1 % IJ SOLN
INTRAMUSCULAR | Status: AC
  Filled 2018-08-09: qty 20

## 2018-08-09 MED ORDER — HEPARIN SOD (PORK) LOCK FLUSH 100 UNIT/ML IV SOLN
500.0000 [IU] | Freq: Once | INTRAVENOUS | Status: DC
  Filled 2018-08-09: qty 5

## 2018-08-09 MED ORDER — MIDAZOLAM HCL 2 MG/2ML IJ SOLN
INTRAMUSCULAR | Status: AC | PRN
  Administered 2018-08-09 (×2): 1 mg via INTRAVENOUS

## 2018-08-09 MED ORDER — FENTANYL CITRATE (PF) 100 MCG/2ML IJ SOLN
INTRAMUSCULAR | Status: AC | PRN
  Administered 2018-08-09 (×2): 50 ug via INTRAVENOUS

## 2018-08-09 NOTE — Discharge Instructions (Signed)
Moderate Conscious Sedation, Adult, Care After These instructions provide you with information about caring for yourself after your procedure. Your health care provider may also give you more specific instructions. Your treatment has been planned according to current medical practices, but problems sometimes occur. Call your health care provider if you have any problems or questions after your procedure. What can I expect after the procedure? After your procedure, it is common:  To feel sleepy for several hours.  To feel clumsy and have poor balance for several hours.  To have poor judgment for several hours.  To vomit if you eat too soon.  Follow these instructions at home: For at least 24 hours after the procedure:   Do not: ? Participate in activities where you could fall or become injured. ? Drive. ? Use heavy machinery. ? Drink alcohol. ? Take sleeping pills or medicines that cause drowsiness. ? Make important decisions or sign legal documents. ? Take care of children on your own.  Rest. Eating and drinking  Follow the diet recommended by your health care provider.  If you vomit: ? Drink water, juice, or soup when you can drink without vomiting. ? Make sure you have little or no nausea before eating solid foods. General instructions  Have a responsible adult stay with you until you are awake and alert.  Take over-the-counter and prescription medicines only as told by your health care provider.  If you smoke, do not smoke without supervision.  Keep all follow-up visits as told by your health care provider. This is important. Contact a health care provider if:  You keep feeling nauseous or you keep vomiting.  You feel light-headed.  You develop a rash.  You have a fever. Get help right away if:  You have trouble breathing. This information is not intended to replace advice given to you by your health care provider. Make sure you discuss any questions you have  with your health care provider. Document Released: 10/04/2013 Document Revised: 05/18/2016 Document Reviewed: 04/04/2016 Elsevier Interactive Patient Education  2018 [place].   Percutaneous Nephrolithotomy, Care After Refer to this sheet in the next few weeks. These instructions provide you with information about caring for yourself after your procedure. Your health care provider may also give you more specific instructions. Your treatment has been planned according to current medical practices, but problems sometimes occur. Call your health care provider if you have any problems or questions after your procedure. What can I expect after the procedure? After the procedure, it is common to have:  Soreness or pain.  Fatigue.  Some blood in your urine for a few days.  Follow these instructions at home: Incision care   Follow instructions from your health care provider about how to take care of your cut from surgery (incision). Make sure you: ? Wash your hands with soap and water before you change your bandage (dressing). If soap and water are not available, use hand sanitizer. ? Change your dressing as told by your health care provider. ? Leave stitches (sutures), skin glue, or adhesive strips in place. These skin closures may need to stay in place for 2 weeks or longer. If adhesive strip edges start to loosen and curl up, you may trim the loose edges. Do not remove adhesive strips completely unless your health care provider tells you to do that.  Check your incision area every day for signs of infection. Check for: ? More redness, swelling, or pain. ? More fluid or blood. ?  Warmth. ? Pus or a bad smell. Activity  Avoid strenuous activities for as long as told by your health care provider.  Return to your normal activities as told by your health care provider. Ask your health care provider what activities are safe for you. General instructions  Take over-the-counter and  prescription medicines only as told by your health care provider.  Do not drive or operate heavy machinery while taking prescription pain medicine.  Keep all follow-up visits as told by your health care provider. This is important.  You may have been sent home with a catheter or kidney drain tube. If so, carefully follow your health care providers instructions on how to take care of your catheter or kidney drain tube.  Care for tubes as directed. Contact a health care provider if:  You have a fever.  You have more redness, swelling, or pain around your incision.  You have more fluid or blood coming from your incision.  Your incision feels warm to the touch.  You have pus or a bad smell coming from your incision.  You lose your appetite.  You feel nauseous or you vomit. Get help right away if:  You have blood clots in your urine.  You cannot urinate.  You have chest pain or trouble breathing. This information is not intended to replace advice given to you by your health care provider. Make sure you discuss any questions you have with your health care provider. Document Released: 01/10/2016 Document Revised: 05/21/2016 Document Reviewed: 06/03/2015 Elsevier Interactive Patient Education  2018 [employer].

## 2018-08-09 NOTE — Procedures (Signed)
Bladder ca, chronic pcns  S/p bilateral pcn exchg No comp Stable Full report in pacs

## 2018-08-11 ENCOUNTER — Encounter: Payer: Self-pay | Admitting: Oncology

## 2018-08-11 ENCOUNTER — Telehealth: Payer: Self-pay | Admitting: *Deleted

## 2018-08-11 ENCOUNTER — Other Ambulatory Visit: Payer: Self-pay | Admitting: Emergency Medicine

## 2018-08-11 DIAGNOSIS — Z95828 Presence of other vascular implants and grafts: Secondary | ICD-10-CM

## 2018-08-11 DIAGNOSIS — B964 Proteus (mirabilis) (morganii) as the cause of diseases classified elsewhere: Secondary | ICD-10-CM | POA: Diagnosis not present

## 2018-08-11 DIAGNOSIS — C679 Malignant neoplasm of bladder, unspecified: Secondary | ICD-10-CM

## 2018-08-11 DIAGNOSIS — N390 Urinary tract infection, site not specified: Secondary | ICD-10-CM | POA: Diagnosis not present

## 2018-08-11 DIAGNOSIS — N183 Chronic kidney disease, stage 3 (moderate): Secondary | ICD-10-CM | POA: Diagnosis not present

## 2018-08-11 DIAGNOSIS — R410 Disorientation, unspecified: Secondary | ICD-10-CM | POA: Diagnosis not present

## 2018-08-11 DIAGNOSIS — I130 Hypertensive heart and chronic kidney disease with heart failure and stage 1 through stage 4 chronic kidney disease, or unspecified chronic kidney disease: Secondary | ICD-10-CM | POA: Diagnosis not present

## 2018-08-11 DIAGNOSIS — Z436 Encounter for attention to other artificial openings of urinary tract: Secondary | ICD-10-CM | POA: Diagnosis not present

## 2018-08-11 DIAGNOSIS — D649 Anemia, unspecified: Secondary | ICD-10-CM | POA: Diagnosis not present

## 2018-08-11 DIAGNOSIS — T83022D Displacement of nephrostomy catheter, subsequent encounter: Secondary | ICD-10-CM | POA: Diagnosis not present

## 2018-08-11 DIAGNOSIS — D696 Thrombocytopenia, unspecified: Secondary | ICD-10-CM | POA: Diagnosis not present

## 2018-08-11 NOTE — Telephone Encounter (Signed)
Wife Charles Coffey states patient is having so much severe pain from right kidney, radiating all the way to his belly and his bladder. And itching is constant, medications not helping.

## 2018-08-12 ENCOUNTER — Telehealth: Payer: Self-pay | Admitting: Oncology

## 2018-08-12 ENCOUNTER — Inpatient Hospital Stay: Payer: Medicare HMO | Attending: Oncology | Admitting: Medical

## 2018-08-12 ENCOUNTER — Telehealth: Payer: Self-pay | Admitting: Medical

## 2018-08-12 ENCOUNTER — Inpatient Hospital Stay: Payer: Medicare HMO

## 2018-08-12 VITALS — BP 108/61 | HR 78 | Temp 97.9°F | Resp 18 | Ht 68.0 in | Wt 176.6 lb

## 2018-08-12 DIAGNOSIS — R109 Unspecified abdominal pain: Secondary | ICD-10-CM | POA: Diagnosis not present

## 2018-08-12 DIAGNOSIS — C679 Malignant neoplasm of bladder, unspecified: Secondary | ICD-10-CM | POA: Insufficient documentation

## 2018-08-12 DIAGNOSIS — F5101 Primary insomnia: Secondary | ICD-10-CM | POA: Diagnosis not present

## 2018-08-12 DIAGNOSIS — F339 Major depressive disorder, recurrent, unspecified: Secondary | ICD-10-CM | POA: Diagnosis not present

## 2018-08-12 DIAGNOSIS — L299 Pruritus, unspecified: Secondary | ICD-10-CM | POA: Diagnosis not present

## 2018-08-12 DIAGNOSIS — F331 Major depressive disorder, recurrent, moderate: Secondary | ICD-10-CM

## 2018-08-12 DIAGNOSIS — Z87891 Personal history of nicotine dependence: Secondary | ICD-10-CM | POA: Insufficient documentation

## 2018-08-12 DIAGNOSIS — F419 Anxiety disorder, unspecified: Secondary | ICD-10-CM | POA: Insufficient documentation

## 2018-08-12 DIAGNOSIS — D649 Anemia, unspecified: Secondary | ICD-10-CM

## 2018-08-12 DIAGNOSIS — R69 Illness, unspecified: Secondary | ICD-10-CM | POA: Diagnosis not present

## 2018-08-12 DIAGNOSIS — C786 Secondary malignant neoplasm of retroperitoneum and peritoneum: Secondary | ICD-10-CM | POA: Diagnosis not present

## 2018-08-12 DIAGNOSIS — Z933 Colostomy status: Secondary | ICD-10-CM | POA: Diagnosis not present

## 2018-08-12 DIAGNOSIS — K624 Stenosis of anus and rectum: Secondary | ICD-10-CM | POA: Diagnosis not present

## 2018-08-12 LAB — CMP (CANCER CENTER ONLY)
ALT: 12 U/L (ref 0–44)
AST: 14 U/L — ABNORMAL LOW (ref 15–41)
Albumin: 3.8 g/dL (ref 3.5–5.0)
Alkaline Phosphatase: 61 U/L (ref 38–126)
Anion gap: 8 (ref 5–15)
BUN: 23 mg/dL (ref 8–23)
CHLORIDE: 108 mmol/L (ref 98–111)
CO2: 21 mmol/L — ABNORMAL LOW (ref 22–32)
CREATININE: 1.61 mg/dL — AB (ref 0.61–1.24)
Calcium: 9.7 mg/dL (ref 8.9–10.3)
GFR, EST NON AFRICAN AMERICAN: 38 mL/min — AB (ref >60)
GFR, Est AFR Am: 45 mL/min — ABNORMAL LOW (ref >60)
Glucose, Bld: 100 mg/dL — ABNORMAL HIGH (ref 70–99)
Potassium: 4.7 mmol/L (ref 3.5–5.1)
Sodium: 137 mmol/L (ref 135–145)
Total Bilirubin: 0.4 mg/dL (ref 0.3–1.2)
Total Protein: 7.2 g/dL (ref 6.5–8.1)

## 2018-08-12 LAB — CBC WITH DIFFERENTIAL (CANCER CENTER ONLY)
Basophils Absolute: 0.1 10*3/uL (ref 0.0–0.1)
Basophils Relative: 2 %
EOS ABS: 0.5 10*3/uL (ref 0.0–0.5)
EOS PCT: 10 %
HCT: 24 % — ABNORMAL LOW (ref 38.4–49.9)
Hemoglobin: 8.1 g/dL — ABNORMAL LOW (ref 13.0–17.1)
LYMPHS ABS: 1.4 10*3/uL (ref 0.9–3.3)
Lymphocytes Relative: 32 %
MCH: 32.8 pg (ref 27.2–33.4)
MCHC: 33.6 g/dL (ref 32.0–36.0)
MCV: 97.7 fL (ref 79.3–98.0)
MONO ABS: 0.3 10*3/uL (ref 0.1–0.9)
Monocytes Relative: 7 %
Neutro Abs: 2.2 10*3/uL (ref 1.5–6.5)
Neutrophils Relative %: 49 %
PLATELETS: 68 10*3/uL — AB (ref 140–400)
RBC: 2.46 MIL/uL — ABNORMAL LOW (ref 4.20–5.82)
RDW: 15.4 % — ABNORMAL HIGH (ref 11.0–14.6)
WBC Count: 4.5 10*3/uL (ref 4.0–10.3)

## 2018-08-12 LAB — TSH: TSH: 1.601 u[IU]/mL (ref 0.320–4.118)

## 2018-08-12 MED ORDER — OXYCODONE-ACETAMINOPHEN 10-325 MG PO TABS: 1.0000 | ORAL_TABLET | ORAL | 0 refills | Status: DC | PRN

## 2018-08-12 MED ORDER — PREDNISONE 5 MG PO TABS: 5.0000 mg | ORAL_TABLET | Freq: Every day | ORAL | 0 refills | Status: DC

## 2018-08-12 MED ORDER — DULOXETINE HCL 20 MG PO CPEP: 20.0000 mg | ORAL_CAPSULE | Freq: Two times a day (BID) | ORAL | 3 refills | Status: DC

## 2018-08-12 NOTE — Patient Instructions (Signed)
Anemia Anemia is a condition in which you do not have enough red blood cells or hemoglobin. Hemoglobin is a substance in red blood cells that carries oxygen. When you do not have enough red blood cells or hemoglobin (are anemic), your body cannot get enough oxygen and your organs may not work properly. As a result, you may feel very tired or have other problems. What are the causes? Common causes of anemia include:  Excessive bleeding. Anemia can be caused by excessive bleeding inside or outside the body, including bleeding from the intestine or from periods in women.  Poor nutrition.  Long-lasting (chronic) kidney, thyroid, and liver disease.  Bone marrow disorders.  Cancer and treatments for cancer.  HIV (human immunodeficiency virus) and AIDS (acquired immunodeficiency syndrome).  Treatments for HIV and AIDS.  Spleen problems.  Blood disorders.  Infections, medicines, and autoimmune disorders that destroy red blood cells.  What are the signs or symptoms? Symptoms of this condition include:  Minor weakness.  Dizziness.  Headache.  Feeling heartbeats that are irregular or faster than normal (palpitations).  Shortness of breath, especially with exercise.  Paleness.  Cold sensitivity.  Indigestion.  Nausea.  Difficulty sleeping.  Difficulty concentrating.  Symptoms may occur suddenly or develop slowly. If your anemia is mild, you may not have symptoms. How is this diagnosed? This condition is diagnosed based on:  Blood tests.  Your medical history.  A physical exam.  Bone marrow biopsy.  Your health care provider may also check your stool (feces) for blood and may do additional testing to look for the cause of your bleeding. You may also have other tests, including:  Imaging tests, such as a CT scan or MRI.  Endoscopy.  Colonoscopy.  How is this treated? Treatment for this condition depends on the cause. If you continue to lose a lot of blood,  you may need to be treated at a hospital. Treatment may include:  Taking supplements of iron, vitamin B12, or folic acid.  Taking a hormone medicine (erythropoietin) that can help to stimulate red blood cell growth.  Having a blood transfusion. This may be needed if you lose a lot of blood.  Making changes to your diet.  Having surgery to remove your spleen.  Follow these instructions at home:  Take over-the-counter and prescription medicines only as told by your health care provider.  Take supplements only as told by your health care provider.  Follow any diet instructions that you were given.  Keep all follow-up visits as told by your health care provider. This is important. Contact a health care provider if:  You develop new bleeding anywhere in the body. Get help right away if:  You are very weak.  You are short of breath.  You have pain in your abdomen or chest.  You are dizzy or feel faint.  You have trouble concentrating.  You have bloody or black, tarry stools.  You vomit repeatedly or you vomit up blood. Summary  Anemia is a condition in which you do not have enough red blood cells or enough of a substance in your red blood cells that carries oxygen (hemoglobin).  Symptoms may occur suddenly or develop slowly.  If your anemia is mild, you may not have symptoms.  This condition is diagnosed with blood tests as well as a medical history and physical exam. Other tests may be needed.  Treatment for this condition depends on the cause of the anemia. This information is not intended to replace advice   given to you by your health care provider. Make sure you discuss any questions you have with your health care provider. Document Released: 01/21/2005 Document Revised: 01/15/2017 Document Reviewed: 01/15/2017 Elsevier Interactive Patient Education  [employer].

## 2018-08-12 NOTE — Telephone Encounter (Signed)
Spoke with patient wife re [place] appointment today. They will be here by 12:30 pm.

## 2018-08-12 NOTE — Progress Notes (Signed)
[facility] Progress Note   Charles Coffey [id number] 10-19-1935 83 y.o.  [name] is managed by Dr. [name]  Actively treated with chemotherapy/immunotherapy: yes  Current Therapy: Keytruda   Assessment: Plan:    Symptomatic anemia - Plan: Practitioner attestation of consent, Complete patient signature process for consent form, Care order/instruction, heparin lock flush 100 unit/mL, Type and screen, Prepare RBC, Transfuse RBC, acetaminophen (TYLENOL) tablet 650 mg, diphenhydrAMINE (BENADRYL) capsule 25 mg  Urothelial carcinoma of bladder ([place]) - Plan: oxyCODONE-acetaminophen (PERCOCET) 10-325 MG tablet, CANCELED: Practitioner attestation of consent, CANCELED: Complete patient signature process for consent form, CANCELED: Care order/instruction, CANCELED: Type and screen, CANCELED: Prepare RBC, CANCELED: Transfuse RBC  Abdominal discomfort - Plan: DULoxetine (CYMBALTA) 20 MG capsule, predniSONE (DELTASONE) 5 MG tablet  Moderate episode of recurrent major depressive disorder ([place]) - Plan: DULoxetine (CYMBALTA) 20 MG capsule  Anxiety - Plan: DULoxetine (CYMBALTA) 20 MG capsule  Primary insomnia  Itching   Symptomatic anemia: A CBC returned today with a hemoglobin of 8.1.  The patient will return on 08/16/2018 for a transfusion of 2 units of packed red blood cells.  Urothelial carcinoma of the bladder: The patient continues to have pain in his right flank and right groin.  He was given a refill of Percocet 10/325 and will continue using oxycodone 10 mg every 12 hours.  He is currently treated with Keytruda.  He and his wife asked if a palliative care consult would be appropriate.  Abdominal discomfort: The patient was given a refill of Percocet 10/325 and will continue using oxycodone 10 mg every 12 hours.  He was given a prescription for Cymbalta 20 mg p.o. twice daily with instructions to begin once daily dosing for 2 weeks then increase to twice daily  dosing.  Depression and anxiety.  Charles Coffey was given a prescription for Cymbalta 20 mg p.o. twice daily with instructions to begin once daily dosing for 2 weeks then increase to twice daily dosing.  Primary insomnia: The patient has been using melatonin 3 mg at bedtime episodically.  He was instructed to continue using this as needed.  Itching: The patient was given a prescription for prednisone 5 mg p.o. every morning x3 weeks.  He will continue using Zantac 300 mg once daily.  He also has a prescription for hydroxyzine 25 mg every 4 hours as needed.  Please see After Visit Summary for patient specific instructions.  Future Appointments  Date Time Provider [place]  08/15/2018  1:15 PM [facility] LAB 4 [facility] None  08/16/2018  7:30 AM [facility] INFUSION [facility] None  08/26/2018 12:45 PM [facility] LAB 2 [facility] None  08/26/2018  1:00 PM [facility] [place] FLUSH [facility] None  08/26/2018  1:30 PM Wyatt Portela, MD [facility] None  08/26/2018  2:30 PM [facility] INFUSION [facility] None  09/23/2018  9:15 AM [facility] LAB 3 [facility] None  09/23/2018  9:30 AM [facility] [place] FLUSH [facility] None  09/23/2018 10:00 AM Wyatt Portela, MD [facility] None  09/23/2018 10:45 AM [facility] INFUSION [facility] None  11/29/2018  3:00 PM Cameron Sprang, MD [facility] None    Orders Placed This Encounter  Procedures  . Type and screen       Subjective:   Patient ID:  Charles Coffey is a 83 y.o. (DOB May 23, 1935) male.  Chief Complaint: No chief complaint on file.   HPI Charles Coffey is an 83 year old male with a history of a urothelial carcinoma who is managed by Dr. [name] and  is currently treated with Keytruda.  The patient had his nephrostomy tubes changed on Tuesday.  He is having bilateral flank pain greater on the right than the left with radiation to the right inguinal area.  He reports having ongoing issues of "itching" within his abdomen.  He reports that  this sensation is an internal and begins at his back and radiates to his abdomen.  He continues on hydroxyzine 25 mg every 4 hours as needed and is on Zantac 300 mg once daily.  He also continues on Percocet which seems to help with this pain.  He and his wife inquired today if a referral to palliative care might be of benefit.  Medications: I have reviewed the patient's current medications.  Allergies:  Allergies  Allergen Reactions  . Antihistamines, Loratadine-Type Other (See Comments)    Unable to urinate  . Statins Other (See Comments)    liver effects    Past Medical History:  Diagnosis Date  . Aortic atherosclerosis ([place])   . BPH with urinary obstruction   . CAD (coronary artery disease)    a.  MI 1995, CABG x 3 2002 (patient says that he had LIMA and RIMA grafts). b. ETT-Cardiolite (10/15) with EF 48%, apical scar, no ischemia. c. Nuc 07/2017 abnormal -> cath was performed,  patent LIMA to LAD and SVG to OM 2. RIMA to RCA is atretic. The right coronary artery is occluded distally with extensive collaterals from the LAD, medical therapy.   . Cancer [facility])    bladder cancer  . Cervical spondylosis without myelopathy 20-Sep-202017  . Chronic low back pain    sees Dr. Kary Kos   . GERD (gastroesophageal reflux disease)   . Hiatal hernia   . Hyperlipidemia   . Hypertension   . IBS (irritable bowel syndrome)   . Ischemic cardiomyopathy   . Myocardial infarction ([place])   . RBBB   . Restless legs syndrome   . Statin intolerance   . Syncope    a. in 2015 - no apparent cause, was taking sleep medicine at the time. Cardiac workup unremarkable.  . Tubular adenoma of colon     Past Surgical History:  Procedure Laterality Date  . COLONOSCOPY  10/17/2014   per Dr. Hilarie Fredrickson, tubular adenomas, repeat in 3 yrs   . COLONOSCOPY WITH PROPOFOL N/A 12/01/2017   Procedure: COLONOSCOPY WITH PROPOFOL;  Surgeon: Milus Banister, MD;  Location: [facility];  Service: Endoscopy;  Laterality: N/A;  .  CYSTOSCOPY W/ RETROGRADES Left 11/25/2017   Procedure: CYSTOSCOPY WITH RETROGRADE PYELOGRAM;  Surgeon: Irine Seal, MD;  Location: [facility];  Service: Urology;  Laterality: Left;  . HEART BYPASS    . IR CONVERT RIGHT NEPHROSTOMY TO NEPHROURETERAL CATH  02/04/2018  . IR EXT NEPHROURETERAL CATH EXCHANGE  04/11/2018  . IR EXT NEPHROURETERAL CATH EXCHANGE  07/04/2018  . IR FLUORO GUIDE CV LINE RIGHT  12/27/2017  . IR NEPHROSTOGRAM LEFT THRU EXISTING ACCESS  12/24/2017  . IR NEPHROSTOMY EXCHANGE LEFT  01/28/2018  . IR NEPHROSTOMY EXCHANGE LEFT  02/04/2018  . IR NEPHROSTOMY EXCHANGE LEFT  02/25/2018  . IR NEPHROSTOMY EXCHANGE LEFT  07/04/2018  . IR NEPHROSTOMY EXCHANGE LEFT  07/05/2018  . IR NEPHROSTOMY EXCHANGE LEFT  08/09/2018  . IR NEPHROSTOMY EXCHANGE RIGHT  12/24/2017  . IR NEPHROSTOMY EXCHANGE RIGHT  01/28/2018  . IR NEPHROSTOMY EXCHANGE RIGHT  04/11/2018  . IR NEPHROSTOMY EXCHANGE RIGHT  07/05/2018  . IR NEPHROSTOMY EXCHANGE RIGHT  08/09/2018  . IR NEPHROSTOMY  PLACEMENT LEFT  11/28/2017  . IR NEPHROSTOMY PLACEMENT RIGHT  12/03/2017  . IR PATIENT EVAL TECH 0-60 MINS  03/24/2018  . IR PATIENT EVAL TECH 0-60 MINS  03/28/2018  . IR PATIENT EVAL TECH 0-60 MINS  04/25/2018  . IR PATIENT EVAL TECH 0-60 MINS  06/20/2018  . IR US GUIDE VASC ACCESS RIGHT  12/27/2017  . LAPAROSCOPY N/A 12/01/2017   Procedure: LAPAROSCOPIC DIVERTING OSTOMY;  Surgeon: Stark Klein, MD;  Location: [facility];  Service: General;  Laterality: N/A;  . LEFT HEART CATH AND CORS/GRAFTS ANGIOGRAPHY N/A 08/25/2017   Procedure: LEFT HEART CATH AND CORS/GRAFTS ANGIOGRAPHY;  Surgeon: Wellington Hampshire, MD;  Location: [place] [facility];  Service: Cardiovascular;  Laterality: N/A;  . LUMBAR FUSION  2003   L3-L4  . PROSTATE SURGERY  06-27-12   per Dr. Roni Bread, had CTT  . TONSILLECTOMY    . TRANSURETHRAL RESECTION OF BLADDER TUMOR N/A 11/25/2017   Procedure: TRANSURETHRAL RESECTION OF BLADDER TUMOR (TURBT);  Surgeon: Irine Seal, MD;  Location: [facility];  Service:  Urology;  Laterality: N/A;    Family History  Problem Relation Age of Onset  . Heart attack Mother   . Aneurysm Father        femoral artery  . Heart disease Brother   . Heart disease Maternal Uncle        x 2  . Colon cancer Neg Hx   . Esophageal cancer Neg Hx   . Pancreatic cancer Neg Hx   . Kidney disease Neg Hx   . Liver disease Neg Hx     Social History   Socioeconomic History  . Marital status: Married    Spouse name: Not on file  . Number of children: 5  . Years of education: Some coll  . Highest education level: Not on file  Occupational History  . Occupation: retired  Scientific laboratory technician  . Financial resource strain: Not on file  . Food insecurity:    Worry: Not on file    Inability: Not on file  . Transportation needs:    Medical: Not on file    Non-medical: Not on file  Tobacco Use  . Smoking status: Former Smoker    Types: Cigarettes    Last attempt to quit: 12/28/1978    Years since quitting: 39.6  . Smokeless tobacco: Never Used  Substance and Sexual Activity  . Alcohol use: Yes    Alcohol/week: 2.0 standard drinks    Types: 1 Glasses of wine, 1 Cans of beer per week    Comment: occassional  . Drug use: No  . Sexual activity: Not on file  Lifestyle  . Physical activity:    Days per week: Not on file    Minutes per session: Not on file  . Stress: Not on file  Relationships  . Social connections:    Talks on phone: Not on file    Gets together: Not on file    Attends religious service: Not on file    Active member of club or organization: Not on file    Attends meetings of clubs or organizations: Not on file    Relationship status: Not on file  . Intimate partner violence:    Fear of current or ex partner: Not on file    Emotionally abused: Not on file    Physically abused: Not on file    Forced sexual activity: Not on file  Other Topics Concern  . Not on file  Social History  Narrative   Lives at home w/ his wife   Right-handed   5 cups of  coffee per day    Past Medical History, Surgical history, Social history, and Family history were reviewed and updated as appropriate.   Please see review of systems for further details on the patient's review from today.   Review of Systems:  Review of Systems  Constitutional: Negative for activity change, appetite change, chills, diaphoresis and fever.  Respiratory: Negative for cough, chest tightness and shortness of breath.   Cardiovascular: Negative for chest pain, palpitations and leg swelling.  Gastrointestinal: Positive for abdominal pain. Negative for abdominal distention, constipation, diarrhea, nausea and vomiting.       Internal abdominal itching.  Musculoskeletal: Positive for back pain.  Psychiatric/Behavioral: Positive for dysphoric mood and sleep disturbance. The patient is nervous/anxious.     Objective:   Physical Exam:  BP 108/61 (BP Location: Left Arm, Patient Position: Sitting)   Pulse 78   Temp 97.9 F (36.6 C) (Oral)   Resp 18   Ht 5\' 8"  (1.727 m)   Wt 176 lb 9.6 oz (80.1 kg)   SpO2 100%   BMI 26.85 kg/m  ECOG: 1  Physical Exam  Constitutional: No distress.  HENT:  Head: Normocephalic and atraumatic.  Cardiovascular: Normal rate, regular rhythm and normal heart sounds. Exam reveals no gallop and no friction rub.  No murmur heard. Pulmonary/Chest: Effort normal and breath sounds normal. No respiratory distress. He has no wheezes. He has no rales.  Abdominal: Soft. Bowel sounds are normal. He exhibits no distension. There is no tenderness. There is no guarding.  A left ostomy is noted.  Musculoskeletal:  Bilateral nephrostomy tubes are noted with scant mucoid drainage at the insertion sites.  Mild erythema with no increased warmth.  Neurological: He is alert.  Skin: Skin is warm and dry. No rash noted. He is not diaphoretic. No erythema.    Lab Review:     Component Value Date/Time   NA 137 08/12/2018 1239   NA 138 02/24/2018 1646   NA 133 (L)  12/29/2017 1014   K 4.7 08/12/2018 1239   K 4.4 12/29/2017 1014   CL 108 08/12/2018 1239   CO2 21 (L) 08/12/2018 1239   CO2 20 (L) 12/29/2017 1014   GLUCOSE 100 (H) 08/12/2018 1239   GLUCOSE 102 12/29/2017 1014   BUN 23 08/12/2018 1239   BUN 25 02/24/2018 1646   BUN 31.5 (H) 12/29/2017 1014   CREATININE 1.61 (H) 08/12/2018 1239   CREATININE 1.6 (H) 12/29/2017 1014   CALCIUM 9.7 08/12/2018 1239   CALCIUM 9.6 12/29/2017 1014   PROT 7.2 08/12/2018 1239   PROT 6.8 12/29/2017 1014   ALBUMIN 3.8 08/12/2018 1239   ALBUMIN 3.8 12/29/2017 1014   AST 14 (L) 08/12/2018 1239   AST 15 12/29/2017 1014   ALT 12 08/12/2018 1239   ALT 13 12/29/2017 1014   ALKPHOS 61 08/12/2018 1239   ALKPHOS 61 12/29/2017 1014   BILITOT 0.4 08/12/2018 1239   BILITOT 0.43 12/29/2017 1014   GFRNONAA 38 (L) 08/12/2018 1239   GFRAA 45 (L) 08/12/2018 1239       Component Value Date/Time   WBC 4.5 08/12/2018 1239   WBC 4.3 07/06/2018 0446   RBC 2.46 (L) 08/12/2018 1239   HGB 8.1 (L) 08/12/2018 1239   HGB 8.8 (L) 12/29/2017 1014   HCT 24.0 (L) 08/12/2018 1239   HCT 26.0 (L) 12/29/2017 1014   PLT 68 (L) 08/12/2018 1239  PLT 70 (L) 12/29/2017 1014   PLT 115 (L) 08/31/2017 1135   MCV 97.7 08/12/2018 1239   MCV 89.3 12/29/2017 1014   MCH 32.8 08/12/2018 1239   MCHC 33.6 08/12/2018 1239   RDW 15.4 (H) 08/12/2018 1239   RDW 15.5 (H) 12/29/2017 1014   LYMPHSABS 1.4 08/12/2018 1239   LYMPHSABS 1.4 12/29/2017 1014   MONOABS 0.3 08/12/2018 1239   MONOABS 0.6 12/29/2017 1014   EOSABS 0.5 08/12/2018 1239   EOSABS 0.2 12/29/2017 1014   BASOSABS 0.1 08/12/2018 1239   BASOSABS 0.0 12/29/2017 1014   -------------------------------  Imaging from last 24 hours (if applicable):  Radiology interpretation: Ir Nephrostomy Exchange Left  Result Date: 08/09/2018 INDICATION: Bladder cancer, chronic nephrostomy, routine exchange EXAM: FLUOROSCOPIC EXCHANGE OF THE BILATERAL NEPHROSTOMIES Date:  08/09/2018 08/09/2018  2:39 pm Radiologist:  Jerilynn Mages. Daryll Brod, MD Guidance:  Fluoroscopic FLUOROSCOPY TIME:  0 minutes 30 seconds (5 mGy). MEDICATIONS: 1% lidocaine local ANESTHESIA/SEDATION: Moderate (conscious) sedation was employed during this procedure. A total of Versed 2 mg and Fentanyl 100 mcg was administered intravenously. Moderate Sedation Time: 10 minutes. The patient's level of consciousness and vital signs were monitored continuously by radiology nursing throughout the procedure under my direct supervision. CONTRAST:  20 cc [id number] COMPLICATIONS: None immediate. PROCEDURE: Informed consent was obtained from the patient following explanation of the procedure, risks, benefits and alternatives. The patient understands, agrees and consents for the procedure. All questions were addressed. A time out was performed. Maximal barrier sterile technique utilized including caps, mask, sterile gowns, sterile gloves, large sterile drape, hand hygiene, and ChloraPrep. Under sterile conditions and local anesthesia, the existing nephrostomy catheters were injected to confirm position in the renal pelvis bilaterally. Nephrostomy catheters were cut and exchanged successfully over Amplatz guidewires. Contrast injection reconfirms position. Images obtained for documentation. Catheter secured with Prolene sutures and connected to external gravity drainage bag. Sterile dressing applied. No immediate complication. Patient tolerated the procedure well. IMPRESSION: Successful fluoroscopic exchange of the bilateral nephrostomy catheters Electronically Signed   By: Jerilynn Mages.  Shick M.D.   On: 08/09/2018 14:49   Ir Nephrostomy Exchange Right  Result Date: 08/09/2018 INDICATION: Bladder cancer, chronic nephrostomy, routine exchange EXAM: FLUOROSCOPIC EXCHANGE OF THE BILATERAL NEPHROSTOMIES Date:  08/09/2018 08/09/2018 2:39 pm Radiologist:  M. Daryll Brod, MD Guidance:  Fluoroscopic FLUOROSCOPY TIME:  0 minutes 30 seconds (5 mGy). MEDICATIONS: 1% lidocaine  local ANESTHESIA/SEDATION: Moderate (conscious) sedation was employed during this procedure. A total of Versed 2 mg and Fentanyl 100 mcg was administered intravenously. Moderate Sedation Time: 10 minutes. The patient's level of consciousness and vital signs were monitored continuously by radiology nursing throughout the procedure under my direct supervision. CONTRAST:  20 cc [id number] COMPLICATIONS: None immediate. PROCEDURE: Informed consent was obtained from the patient following explanation of the procedure, risks, benefits and alternatives. The patient understands, agrees and consents for the procedure. All questions were addressed. A time out was performed. Maximal barrier sterile technique utilized including caps, mask, sterile gowns, sterile gloves, large sterile drape, hand hygiene, and ChloraPrep. Under sterile conditions and local anesthesia, the existing nephrostomy catheters were injected to confirm position in the renal pelvis bilaterally. Nephrostomy catheters were cut and exchanged successfully over Amplatz guidewires. Contrast injection reconfirms position. Images obtained for documentation. Catheter secured with Prolene sutures and connected to external gravity drainage bag. Sterile dressing applied. No immediate complication. Patient tolerated the procedure well. IMPRESSION: Successful fluoroscopic exchange of the bilateral nephrostomy catheters Electronically Signed   By: Jerilynn Mages.  Shick M.D.  On: 08/09/2018 14:49

## 2018-08-12 NOTE — Telephone Encounter (Signed)
Appts scheduled calendar printed per 8/16 los

## 2018-08-12 NOTE — Progress Notes (Signed)
Pt seen by PA Lucianne Lei only, no RN assessment at this time.  Pa Lucianne Lei aware

## 2018-08-15 ENCOUNTER — Encounter (HOSPITAL_COMMUNITY): Payer: Self-pay

## 2018-08-15 ENCOUNTER — Other Ambulatory Visit: Payer: Self-pay

## 2018-08-15 ENCOUNTER — Emergency Department (HOSPITAL_COMMUNITY)
Admission: EM | Admit: 2018-08-15 | Discharge: 2018-08-16 | Disposition: A | Discharge status: Home / Self Care | Payer: Medicare HMO | Source: Home / Self Care | Attending: Emergency Medicine | Admitting: Emergency Medicine

## 2018-08-15 ENCOUNTER — Inpatient Hospital Stay: Payer: Medicare HMO

## 2018-08-15 ENCOUNTER — Other Ambulatory Visit: Payer: Self-pay | Admitting: Emergency Medicine

## 2018-08-15 DIAGNOSIS — A419 Sepsis, unspecified organism: Secondary | ICD-10-CM | POA: Diagnosis not present

## 2018-08-15 DIAGNOSIS — F0391 Unspecified dementia with behavioral disturbance: Secondary | ICD-10-CM | POA: Diagnosis present

## 2018-08-15 DIAGNOSIS — C679 Malignant neoplasm of bladder, unspecified: Secondary | ICD-10-CM | POA: Diagnosis not present

## 2018-08-15 DIAGNOSIS — Z79899 Other long term (current) drug therapy: Secondary | ICD-10-CM

## 2018-08-15 DIAGNOSIS — Y732 Prosthetic and other implants, materials and accessory gastroenterology and urology devices associated with adverse incidents: Secondary | ICD-10-CM | POA: Diagnosis present

## 2018-08-15 DIAGNOSIS — I252 Old myocardial infarction: Secondary | ICD-10-CM

## 2018-08-15 DIAGNOSIS — M545 Low back pain: Secondary | ICD-10-CM | POA: Diagnosis present

## 2018-08-15 DIAGNOSIS — N136 Pyonephrosis: Secondary | ICD-10-CM | POA: Diagnosis present

## 2018-08-15 DIAGNOSIS — Y733 Surgical instruments, materials and gastroenterology and urology devices (including sutures) associated with adverse incidents: Secondary | ICD-10-CM | POA: Insufficient documentation

## 2018-08-15 DIAGNOSIS — Z87891 Personal history of nicotine dependence: Secondary | ICD-10-CM

## 2018-08-15 DIAGNOSIS — N390 Urinary tract infection, site not specified: Secondary | ICD-10-CM | POA: Diagnosis not present

## 2018-08-15 DIAGNOSIS — R410 Disorientation, unspecified: Secondary | ICD-10-CM | POA: Diagnosis not present

## 2018-08-15 DIAGNOSIS — K589 Irritable bowel syndrome without diarrhea: Secondary | ICD-10-CM

## 2018-08-15 DIAGNOSIS — D649 Anemia, unspecified: Secondary | ICD-10-CM | POA: Diagnosis not present

## 2018-08-15 DIAGNOSIS — K219 Gastro-esophageal reflux disease without esophagitis: Secondary | ICD-10-CM | POA: Insufficient documentation

## 2018-08-15 DIAGNOSIS — T83022A Displacement of nephrostomy catheter, initial encounter: Secondary | ICD-10-CM

## 2018-08-15 DIAGNOSIS — R451 Restlessness and agitation: Secondary | ICD-10-CM | POA: Diagnosis not present

## 2018-08-15 DIAGNOSIS — Z933 Colostomy status: Secondary | ICD-10-CM | POA: Diagnosis not present

## 2018-08-15 DIAGNOSIS — E871 Hypo-osmolality and hyponatremia: Secondary | ICD-10-CM | POA: Diagnosis present

## 2018-08-15 DIAGNOSIS — Z951 Presence of aortocoronary bypass graft: Secondary | ICD-10-CM | POA: Insufficient documentation

## 2018-08-15 DIAGNOSIS — E872 Acidosis: Secondary | ICD-10-CM | POA: Diagnosis not present

## 2018-08-15 DIAGNOSIS — K624 Stenosis of anus and rectum: Secondary | ICD-10-CM | POA: Diagnosis not present

## 2018-08-15 DIAGNOSIS — D6959 Other secondary thrombocytopenia: Secondary | ICD-10-CM | POA: Diagnosis present

## 2018-08-15 DIAGNOSIS — I25118 Atherosclerotic heart disease of native coronary artery with other forms of angina pectoris: Secondary | ICD-10-CM | POA: Diagnosis not present

## 2018-08-15 DIAGNOSIS — I130 Hypertensive heart and chronic kidney disease with heart failure and stage 1 through stage 4 chronic kidney disease, or unspecified chronic kidney disease: Secondary | ICD-10-CM

## 2018-08-15 DIAGNOSIS — I700 Atherosclerosis of aorta: Secondary | ICD-10-CM | POA: Diagnosis present

## 2018-08-15 DIAGNOSIS — T83092A Other mechanical complication of nephrostomy catheter, initial encounter: Secondary | ICD-10-CM | POA: Diagnosis not present

## 2018-08-15 DIAGNOSIS — N320 Bladder-neck obstruction: Secondary | ICD-10-CM | POA: Diagnosis present

## 2018-08-15 DIAGNOSIS — F339 Major depressive disorder, recurrent, unspecified: Secondary | ICD-10-CM | POA: Diagnosis not present

## 2018-08-15 DIAGNOSIS — T451X5A Adverse effect of antineoplastic and immunosuppressive drugs, initial encounter: Secondary | ICD-10-CM | POA: Diagnosis present

## 2018-08-15 DIAGNOSIS — I2510 Atherosclerotic heart disease of native coronary artery without angina pectoris: Secondary | ICD-10-CM | POA: Insufficient documentation

## 2018-08-15 DIAGNOSIS — I255 Ischemic cardiomyopathy: Secondary | ICD-10-CM

## 2018-08-15 DIAGNOSIS — Z888 Allergy status to other drugs, medicaments and biological substances status: Secondary | ICD-10-CM

## 2018-08-15 DIAGNOSIS — I4510 Unspecified right bundle-branch block: Secondary | ICD-10-CM

## 2018-08-15 DIAGNOSIS — F419 Anxiety disorder, unspecified: Secondary | ICD-10-CM | POA: Diagnosis not present

## 2018-08-15 DIAGNOSIS — Z7952 Long term (current) use of systemic steroids: Secondary | ICD-10-CM | POA: Insufficient documentation

## 2018-08-15 DIAGNOSIS — A4102 Sepsis due to Methicillin resistant Staphylococcus aureus: Secondary | ICD-10-CM | POA: Diagnosis present

## 2018-08-15 DIAGNOSIS — N1330 Unspecified hydronephrosis: Secondary | ICD-10-CM | POA: Diagnosis not present

## 2018-08-15 DIAGNOSIS — Z981 Arthrodesis status: Secondary | ICD-10-CM

## 2018-08-15 DIAGNOSIS — Z8551 Personal history of malignant neoplasm of bladder: Secondary | ICD-10-CM | POA: Insufficient documentation

## 2018-08-15 DIAGNOSIS — E861 Hypovolemia: Secondary | ICD-10-CM | POA: Diagnosis present

## 2018-08-15 DIAGNOSIS — C678 Malignant neoplasm of overlapping sites of bladder: Secondary | ICD-10-CM | POA: Diagnosis not present

## 2018-08-15 DIAGNOSIS — N183 Chronic kidney disease, stage 3 (moderate): Secondary | ICD-10-CM

## 2018-08-15 DIAGNOSIS — T83022D Displacement of nephrostomy catheter, subsequent encounter: Secondary | ICD-10-CM | POA: Diagnosis not present

## 2018-08-15 DIAGNOSIS — E785 Hyperlipidemia, unspecified: Secondary | ICD-10-CM | POA: Diagnosis present

## 2018-08-15 DIAGNOSIS — Z6826 Body mass index (BMI) 26.0-26.9, adult: Secondary | ICD-10-CM

## 2018-08-15 DIAGNOSIS — F05 Delirium due to known physiological condition: Secondary | ICD-10-CM | POA: Diagnosis present

## 2018-08-15 DIAGNOSIS — G8929 Other chronic pain: Secondary | ICD-10-CM

## 2018-08-15 DIAGNOSIS — Z79890 Hormone replacement therapy (postmenopausal): Secondary | ICD-10-CM

## 2018-08-15 DIAGNOSIS — B964 Proteus (mirabilis) (morganii) as the cause of diseases classified elsewhere: Secondary | ICD-10-CM | POA: Diagnosis not present

## 2018-08-15 DIAGNOSIS — I313 Pericardial effusion (noninflammatory): Secondary | ICD-10-CM | POA: Diagnosis not present

## 2018-08-15 DIAGNOSIS — G2581 Restless legs syndrome: Secondary | ICD-10-CM | POA: Insufficient documentation

## 2018-08-15 DIAGNOSIS — Z515 Encounter for palliative care: Secondary | ICD-10-CM | POA: Diagnosis not present

## 2018-08-15 DIAGNOSIS — T83512A Infection and inflammatory reaction due to nephrostomy catheter, initial encounter: Secondary | ICD-10-CM | POA: Diagnosis not present

## 2018-08-15 DIAGNOSIS — R339 Retention of urine, unspecified: Secondary | ICD-10-CM | POA: Diagnosis not present

## 2018-08-15 DIAGNOSIS — L299 Pruritus, unspecified: Secondary | ICD-10-CM | POA: Diagnosis not present

## 2018-08-15 DIAGNOSIS — Z66 Do not resuscitate: Secondary | ICD-10-CM | POA: Diagnosis present

## 2018-08-15 DIAGNOSIS — R109 Unspecified abdominal pain: Secondary | ICD-10-CM | POA: Diagnosis not present

## 2018-08-15 DIAGNOSIS — D696 Thrombocytopenia, unspecified: Secondary | ICD-10-CM | POA: Diagnosis not present

## 2018-08-15 DIAGNOSIS — I5022 Chronic systolic (congestive) heart failure: Secondary | ICD-10-CM

## 2018-08-15 DIAGNOSIS — K449 Diaphragmatic hernia without obstruction or gangrene: Secondary | ICD-10-CM | POA: Diagnosis present

## 2018-08-15 DIAGNOSIS — E440 Moderate protein-calorie malnutrition: Secondary | ICD-10-CM | POA: Diagnosis present

## 2018-08-15 DIAGNOSIS — I10 Essential (primary) hypertension: Secondary | ICD-10-CM | POA: Diagnosis not present

## 2018-08-15 DIAGNOSIS — R69 Illness, unspecified: Secondary | ICD-10-CM | POA: Diagnosis not present

## 2018-08-15 DIAGNOSIS — N99528 Other complication of other external stoma of urinary tract: Secondary | ICD-10-CM | POA: Diagnosis not present

## 2018-08-15 DIAGNOSIS — R338 Other retention of urine: Secondary | ICD-10-CM | POA: Diagnosis not present

## 2018-08-15 DIAGNOSIS — F5101 Primary insomnia: Secondary | ICD-10-CM | POA: Diagnosis not present

## 2018-08-15 DIAGNOSIS — Z79891 Long term (current) use of opiate analgesic: Secondary | ICD-10-CM

## 2018-08-15 DIAGNOSIS — Z466 Encounter for fitting and adjustment of urinary device: Secondary | ICD-10-CM | POA: Diagnosis not present

## 2018-08-15 DIAGNOSIS — N3289 Other specified disorders of bladder: Secondary | ICD-10-CM | POA: Diagnosis not present

## 2018-08-15 DIAGNOSIS — Z8614 Personal history of Methicillin resistant Staphylococcus aureus infection: Secondary | ICD-10-CM

## 2018-08-15 DIAGNOSIS — Z436 Encounter for attention to other artificial openings of urinary tract: Secondary | ICD-10-CM | POA: Diagnosis not present

## 2018-08-15 DIAGNOSIS — N138 Other obstructive and reflux uropathy: Secondary | ICD-10-CM | POA: Diagnosis not present

## 2018-08-15 DIAGNOSIS — C786 Secondary malignant neoplasm of retroperitoneum and peritoneum: Secondary | ICD-10-CM | POA: Diagnosis not present

## 2018-08-15 DIAGNOSIS — D630 Anemia in neoplastic disease: Secondary | ICD-10-CM | POA: Diagnosis present

## 2018-08-15 DIAGNOSIS — I5042 Chronic combined systolic (congestive) and diastolic (congestive) heart failure: Secondary | ICD-10-CM | POA: Diagnosis present

## 2018-08-15 DIAGNOSIS — G893 Neoplasm related pain (acute) (chronic): Secondary | ICD-10-CM | POA: Diagnosis not present

## 2018-08-15 DIAGNOSIS — N179 Acute kidney failure, unspecified: Secondary | ICD-10-CM | POA: Diagnosis not present

## 2018-08-15 LAB — PREPARE RBC (CROSSMATCH)

## 2018-08-15 LAB — ABO/RH: ABO/RH(D): A POS

## 2018-08-15 NOTE — ED Notes (Signed)
Pt wife out to desk inquiring about wait, updated on delay. Pt placed in reclining triage chair, warm blankets given. Comfort measures.

## 2018-08-15 NOTE — ED Triage Notes (Signed)
Pt is alert and oriented x 4 and is escorted with spouse. Pt has hx of Bladder cancer and has bilateral Nephrostomy tubes. Pt wife reports that the left side tube was causing pain and draining bloody output , wife reports that she went to change it and tube completely came out. Pt reports that this has  Occurred in the past . {Pt denies pain at this time.   Pt last chemo 2 weeks ago.

## 2018-08-16 ENCOUNTER — Other Ambulatory Visit: Payer: Self-pay | Admitting: Radiology

## 2018-08-16 ENCOUNTER — Ambulatory Visit (HOSPITAL_COMMUNITY)
Admission: RE | Admit: 2018-08-16 | Discharge: 2018-08-16 | Disposition: A | Discharge status: Home / Self Care | Payer: Medicare HMO | Source: Ambulatory Visit | Attending: Interventional Radiology | Admitting: Interventional Radiology

## 2018-08-16 ENCOUNTER — Inpatient Hospital Stay: Payer: Medicare HMO

## 2018-08-16 ENCOUNTER — Other Ambulatory Visit (HOSPITAL_COMMUNITY): Payer: Self-pay | Admitting: Interventional Radiology

## 2018-08-16 ENCOUNTER — Other Ambulatory Visit: Payer: Self-pay | Admitting: *Deleted

## 2018-08-16 ENCOUNTER — Encounter (HOSPITAL_COMMUNITY): Payer: Self-pay

## 2018-08-16 DIAGNOSIS — E785 Hyperlipidemia, unspecified: Secondary | ICD-10-CM | POA: Insufficient documentation

## 2018-08-16 DIAGNOSIS — G2581 Restless legs syndrome: Secondary | ICD-10-CM | POA: Insufficient documentation

## 2018-08-16 DIAGNOSIS — T83092A Other mechanical complication of nephrostomy catheter, initial encounter: Secondary | ICD-10-CM | POA: Diagnosis not present

## 2018-08-16 DIAGNOSIS — K589 Irritable bowel syndrome without diarrhea: Secondary | ICD-10-CM

## 2018-08-16 DIAGNOSIS — Z87891 Personal history of nicotine dependence: Secondary | ICD-10-CM

## 2018-08-16 DIAGNOSIS — G8929 Other chronic pain: Secondary | ICD-10-CM

## 2018-08-16 DIAGNOSIS — T83022A Displacement of nephrostomy catheter, initial encounter: Secondary | ICD-10-CM

## 2018-08-16 DIAGNOSIS — Z7952 Long term (current) use of systemic steroids: Secondary | ICD-10-CM

## 2018-08-16 DIAGNOSIS — Y732 Prosthetic and other implants, materials and accessory gastroenterology and urology devices associated with adverse incidents: Secondary | ICD-10-CM

## 2018-08-16 DIAGNOSIS — C679 Malignant neoplasm of bladder, unspecified: Secondary | ICD-10-CM

## 2018-08-16 DIAGNOSIS — I10 Essential (primary) hypertension: Secondary | ICD-10-CM | POA: Insufficient documentation

## 2018-08-16 DIAGNOSIS — K219 Gastro-esophageal reflux disease without esophagitis: Secondary | ICD-10-CM

## 2018-08-16 DIAGNOSIS — I252 Old myocardial infarction: Secondary | ICD-10-CM | POA: Insufficient documentation

## 2018-08-16 DIAGNOSIS — I4510 Unspecified right bundle-branch block: Secondary | ICD-10-CM | POA: Insufficient documentation

## 2018-08-16 DIAGNOSIS — I2510 Atherosclerotic heart disease of native coronary artery without angina pectoris: Secondary | ICD-10-CM | POA: Insufficient documentation

## 2018-08-16 DIAGNOSIS — M545 Low back pain: Secondary | ICD-10-CM | POA: Insufficient documentation

## 2018-08-16 DIAGNOSIS — Z951 Presence of aortocoronary bypass graft: Secondary | ICD-10-CM | POA: Insufficient documentation

## 2018-08-16 DIAGNOSIS — K624 Stenosis of anus and rectum: Secondary | ICD-10-CM | POA: Diagnosis not present

## 2018-08-16 DIAGNOSIS — I255 Ischemic cardiomyopathy: Secondary | ICD-10-CM | POA: Insufficient documentation

## 2018-08-16 DIAGNOSIS — Z933 Colostomy status: Secondary | ICD-10-CM | POA: Diagnosis not present

## 2018-08-16 HISTORY — PX: IR NEPHROSTOMY EXCHANGE LEFT: IMG6069

## 2018-08-16 LAB — PREPARE RBC (CROSSMATCH)

## 2018-08-16 MED ORDER — LIDOCAINE HCL 1 % IJ SOLN
INTRAMUSCULAR | Status: AC
  Filled 2018-08-16: qty 20

## 2018-08-16 MED ORDER — HEPARIN SOD (PORK) LOCK FLUSH 100 UNIT/ML IV SOLN
500.0000 [IU] | INTRAVENOUS | Status: AC | PRN
  Administered 2018-08-16: 500 [IU]

## 2018-08-16 MED ORDER — MIDAZOLAM HCL 2 MG/2ML IJ SOLN
INTRAMUSCULAR | Status: AC | PRN
  Administered 2018-08-16 (×3): 1 mg via INTRAVENOUS

## 2018-08-16 MED ORDER — IOPAMIDOL (ISOVUE-300) INJECTION 61%
INTRAVENOUS | Status: AC
  Administered 2018-08-16: 5 mL
  Filled 2018-08-16: qty 50

## 2018-08-16 MED ORDER — FENTANYL CITRATE (PF) 100 MCG/2ML IJ SOLN
INTRAMUSCULAR | Status: AC
  Filled 2018-08-16: qty 4

## 2018-08-16 MED ORDER — MIDAZOLAM HCL 2 MG/2ML IJ SOLN
INTRAMUSCULAR | Status: AC
  Filled 2018-08-16: qty 4

## 2018-08-16 MED ORDER — FENTANYL CITRATE (PF) 100 MCG/2ML IJ SOLN
INTRAMUSCULAR | Status: AC | PRN
  Administered 2018-08-16 (×2): 50 ug via INTRAVENOUS

## 2018-08-16 MED ORDER — CIPROFLOXACIN IN D5W 400 MG/200ML IV SOLN
INTRAVENOUS | Status: AC
  Administered 2018-08-16: 400 mg
  Filled 2018-08-16: qty 200

## 2018-08-16 MED ORDER — LIDOCAINE HCL (PF) 1 % IJ SOLN
INTRAMUSCULAR | Status: AC | PRN
  Administered 2018-08-16: 5 mL

## 2018-08-16 NOTE — Discharge Instructions (Addendum)
Call Interventional Radiology at [facility] at 8am if you do not hear from them.  [phone] You develop fever, worsening back pain, any new or worsening symptoms you should be reevaluated immediately.

## 2018-08-16 NOTE — Sedation Documentation (Signed)
Patient is resting comfortably. 

## 2018-08-16 NOTE — Procedures (Signed)
Interventional Radiology Procedure Note  Procedure: Left PCN exchange  Complications: None  Estimated Blood Loss: None  Findings: Tract catheterized, dilated and new 12 Fr PCN placed and formed in renal pelvis.  Attached to gravity bag.  Venetia Night. Kathlene Cote, M.D Pager:  [phone]

## 2018-08-16 NOTE — H&P (Signed)
Chief Complaint: Patient was seen in consultation today for left percutaneous nephrostomy tube replacement    Supervising Physician: Aletta Edouard  Patient Status: [facility] ED  History of Present Illness: Charles Coffey is a 83 y.o. male   Bladder Cancer Hx Bilateral percutaneous nephrostomy drains placed 11/2017 in IR for obstructive hydronephrosis Routine exchanges Last exchange 07/05/18 - Dr Annamaria Boots  Wife was changing bandage last night and entire nephrostomy tube was dislodged Came to ED Request for replacement  Dr Kathlene Cote has approved procedure   Past Medical History:  Diagnosis Date  . Aortic atherosclerosis ([place])   . BPH with urinary obstruction   . CAD (coronary artery disease)    a.  MI 1995, CABG x 3 2002 (patient says that he had LIMA and RIMA grafts). b. ETT-Cardiolite (10/15) with EF 48%, apical scar, no ischemia. c. Nuc 07/2017 abnormal -> cath was performed,  patent LIMA to LAD and SVG to OM 2. RIMA to RCA is atretic. The right coronary artery is occluded distally with extensive collaterals from the LAD, medical therapy.   . Cancer [facility])    bladder cancer  . Cervical spondylosis without myelopathy 2020/07/2816  . Chronic low back pain    sees Dr. Kary Kos   . GERD (gastroesophageal reflux disease)   . Hiatal hernia   . Hyperlipidemia   . Hypertension   . IBS (irritable bowel syndrome)   . Ischemic cardiomyopathy   . Myocardial infarction ([place])   . RBBB   . Restless legs syndrome   . Statin intolerance   . Syncope    a. in 2015 - no apparent cause, was taking sleep medicine at the time. Cardiac workup unremarkable.  . Tubular adenoma of colon     Past Surgical History:  Procedure Laterality Date  . COLONOSCOPY  10/17/2014   per Dr. Hilarie Fredrickson, tubular adenomas, repeat in 3 yrs   . COLONOSCOPY WITH PROPOFOL N/A 12/01/2017   Procedure: COLONOSCOPY WITH PROPOFOL;  Surgeon: Milus Banister, MD;  Location: [facility] ENDOSCOPY;  Service: Endoscopy;  Laterality:  N/A;  . CYSTOSCOPY W/ RETROGRADES Left 11/25/2017   Procedure: CYSTOSCOPY WITH RETROGRADE PYELOGRAM;  Surgeon: Irine Seal, MD;  Location: [facility] ORS;  Service: Urology;  Laterality: Left;  . HEART BYPASS    . IR CONVERT RIGHT NEPHROSTOMY TO NEPHROURETERAL CATH  02/04/2018  . IR EXT NEPHROURETERAL CATH EXCHANGE  04/11/2018  . IR EXT NEPHROURETERAL CATH EXCHANGE  07/04/2018  . IR FLUORO GUIDE CV LINE RIGHT  12/27/2017  . IR NEPHROSTOGRAM LEFT THRU EXISTING ACCESS  12/24/2017  . IR NEPHROSTOMY EXCHANGE LEFT  01/28/2018  . IR NEPHROSTOMY EXCHANGE LEFT  02/04/2018  . IR NEPHROSTOMY EXCHANGE LEFT  02/25/2018  . IR NEPHROSTOMY EXCHANGE LEFT  07/04/2018  . IR NEPHROSTOMY EXCHANGE LEFT  07/05/2018  . IR NEPHROSTOMY EXCHANGE LEFT  08/09/2018  . IR NEPHROSTOMY EXCHANGE RIGHT  12/24/2017  . IR NEPHROSTOMY EXCHANGE RIGHT  01/28/2018  . IR NEPHROSTOMY EXCHANGE RIGHT  04/11/2018  . IR NEPHROSTOMY EXCHANGE RIGHT  07/05/2018  . IR NEPHROSTOMY EXCHANGE RIGHT  08/09/2018  . IR NEPHROSTOMY PLACEMENT LEFT  11/28/2017  . IR NEPHROSTOMY PLACEMENT RIGHT  12/03/2017  . IR PATIENT EVAL TECH 0-60 MINS  03/24/2018  . IR PATIENT EVAL TECH 0-60 MINS  03/28/2018  . IR PATIENT EVAL TECH 0-60 MINS  04/25/2018  . IR PATIENT EVAL TECH 0-60 MINS  06/20/2018  . IR US GUIDE VASC ACCESS RIGHT  12/27/2017  . LAPAROSCOPY N/A 12/01/2017  Procedure: LAPAROSCOPIC DIVERTING OSTOMY;  Surgeon: Stark Klein, MD;  Location: [facility];  Service: General;  Laterality: N/A;  . LEFT HEART CATH AND CORS/GRAFTS ANGIOGRAPHY N/A 08/25/2017   Procedure: LEFT HEART CATH AND CORS/GRAFTS ANGIOGRAPHY;  Surgeon: Wellington Hampshire, MD;  Location: [place] CV LAB;  Service: Cardiovascular;  Laterality: N/A;  . LUMBAR FUSION  2003   L3-L4  . PROSTATE SURGERY  06-27-12   per Dr. Roni Bread, had CTT  . TONSILLECTOMY    . TRANSURETHRAL RESECTION OF BLADDER TUMOR N/A 11/25/2017   Procedure: TRANSURETHRAL RESECTION OF BLADDER TUMOR (TURBT);  Surgeon: Irine Seal, MD;  Location: [facility];   Service: Urology;  Laterality: N/A;    Allergies: Antihistamines, loratadine-type and Statins  Medications: Prior to Admission medications   Medication Sig Start Date End Date Taking? Authorizing Provider  acetaminophen (TYLENOL) 500 MG tablet Take 1,000 mg by mouth every 8 (eight) hours as needed for mild pain or moderate pain.   Yes [provider]  carvedilol (COREG) 6.25 MG tablet Take 3.125 mg by mouth 2 (two) times daily with a meal.   Yes [provider]  DULoxetine (CYMBALTA) 20 MG capsule Take 1 capsule (20 mg total) by mouth 2 (two) times daily. Take once daily for 2 weeks then increase to twice daily Patient taking differently: Take 20 mg by mouth See admin instructions. Take 20 mg by mouth once daily for 2 weeks then increase to twice daily 08/12/18  Yes Tanner, Lyndon Code., PA-C  feeding supplement (BOOST HIGH PROTEIN) LIQD Take 1 Container by mouth 2 (two) times daily between meals.   Yes [provider]  furosemide (LASIX) 40 MG tablet Take 1 tablet (40 mg total) by mouth 2 (two) times daily. Patient taking differently: Take 40 mg by mouth as needed for fluid or edema.  05/13/18  Yes End, Harrell Gave, MD  hydrOXYzine (ATARAX/VISTARIL) 25 MG tablet Take 1 tablet (25 mg total) by mouth every 4 (four) hours as needed for itching. 07/21/18  Yes [name], MD  LINZESS 290 MCG CAPS capsule TAKE 1 CAP BY MOUTH ONCE DAILY 30 MIN PRIOR TO A MEAL. MUST HAVE OFFICE VISIT FOR FURTHER REFILLS Patient taking differently: Take 290 mcg by mouth daily.  05/12/18  Yes Pyrtle, Lajuan Lines, MD  Melatonin 3 MG CAPS Take 6 mg by mouth at bedtime as needed (sleep).   Yes [provider]  oxyCODONE-acetaminophen (PERCOCET) 10-325 MG tablet Take 1 tablet by mouth every 4 (four) hours as needed for pain. 08/12/18  Yes Tanner, Lyndon Code., PA-C  predniSONE (DELTASONE) 5 MG tablet Take 1 tablet (5 mg total) by mouth daily with breakfast. 08/12/18  Yes Tanner, Lyndon Code., PA-C  carvedilol  (COREG) 3.125 MG tablet Take 1 tablet (3.125 mg total) by mouth 2 (two) times daily. Patient not taking: Reported on 08/15/2018 07/04/18 08/15/18  Donne Hazel, MD  guaiFENesin-codeine 100-10 MG/5ML syrup Take 5 mLs by mouth every 6 (six) hours as needed for cough. 06/13/18   Tanner, Lyndon Code., PA-C  lidocaine-prilocaine (EMLA) cream Apply 1 application topically as needed. Patient taking differently: Apply 1 application topically as needed (port access).  12/15/17   Wyatt Portela, MD  megestrol (MEGACE) 40 MG/ML suspension TAKE 10ML BY MOUTH TWICE A DAY Patient taking differently: Take 400 mg by mouth 2 (two) times daily as needed (weight gain).  05/13/18   Wyatt Portela, MD  mirabegron ER (MYRBETRIQ) 25 MG TB24 tablet Take 1 tablet (25 mg total)  by mouth daily. 02/04/18   Janece Canterbury, MD  nitroGLYCERIN (NITROSTAT) 0.4 MG SL tablet Place 1 tablet (0.4 mg total) under the tongue every 5 (five) minutes as needed. 08/19/17 06/30/18  Dunn, Nedra Hai, PA-C  oxyCODONE (OXYCONTIN) 10 mg 12 hr tablet Take 1 tablet (10 mg total) by mouth 2 (two) times daily. Patient taking differently: Take 10 mg by mouth at bedtime.  07/22/18   Wyatt Portela, MD  pembrolizumab [facility]) 100 MG/4ML SOLN Given at infusion center every 3 weeks    [provider]  pramipexole (MIRAPEX) 1 MG tablet TAKE 1 AND 1/2 TABLETS BY MOUTH AT BEDTIME Patient taking differently: Take 1.5 mg by mouth every evening.  06/29/18   [name], MD  prochlorperazine (COMPAZINE) 10 MG tablet Take 1 tablet (10 mg total) by mouth every 6 (six) hours as needed for nausea or vomiting. Patient not taking: Reported on 08/15/2018 12/15/17   Wyatt Portela, MD  QUEtiapine (SEROQUEL) 25 MG tablet TAKE 1 TABLET BY MOUTH EVERYDAY AT BEDTIME Patient taking differently: Take 25 mg by mouth at bedtime.  08/03/18   [name], MD  ranitidine (ZANTAC) 300 MG tablet TAKE 1 TABLET BY MOUTH TWICE A DAY Patient taking differently: Take 300 mg by mouth 2  (two) times daily.  07/11/18   Irene Shipper, MD  Tetrahydroz-Glyc-Hyprom-PEG (VISINE MAXIMUM REDNESS RELIEF OP) Place 2 drops into both eyes 2 (two) times daily as needed (dry eyes).     [provider]  triamcinolone cream (KENALOG) 0.1 % Apply topically 3 (three) times daily. To affected areas Patient not taking: Reported on 08/15/2018 07/04/18   Donne Hazel, MD     Family History  Problem Relation Age of Onset  . Heart attack Mother   . Aneurysm Father        femoral artery  . Heart disease Brother   . Heart disease Maternal Uncle        x 2  . Colon cancer Neg Hx   . Esophageal cancer Neg Hx   . Pancreatic cancer Neg Hx   . Kidney disease Neg Hx   . Liver disease Neg Hx     Social History   Socioeconomic History  . Marital status: Married    Spouse name: Not on file  . Number of children: 5  . Years of education: Some coll  . Highest education level: Not on file  Occupational History  . Occupation: retired  Scientific laboratory technician  . Financial resource strain: Not on file  . Food insecurity:    Worry: Not on file    Inability: Not on file  . Transportation needs:    Medical: Not on file    Non-medical: Not on file  Tobacco Use  . Smoking status: Former Smoker    Types: Cigarettes    Last attempt to quit: 12/28/1978    Years since quitting: 39.6  . Smokeless tobacco: Never Used  Substance and Sexual Activity  . Alcohol use: Not Currently    Alcohol/week: 2.0 standard drinks    Types: 1 Glasses of wine, 1 Cans of beer per week    Comment: occassional  . Drug use: No  . Sexual activity: Not Currently  Lifestyle  . Physical activity:    Days per week: Not on file    Minutes per session: Not on file  . Stress: Not on file  Relationships  . Social connections:    Talks on phone: Not on file  Gets together: Not on file    Attends religious service: Not on file    Active member of club or organization: Not on file    Attends meetings of clubs or  organizations: Not on file    Relationship status: Not on file  Other Topics Concern  . Not on file  Social History Narrative   Lives at home w/ his wife   Right-handed   5 cups of coffee per day    Review of Systems: A 12 point ROS discussed and pertinent positives are indicated in the HPI above.  All other systems are negative.  Review of Systems  Constitutional: Negative for activity change, fatigue and fever.  Respiratory: Negative for shortness of breath.   Gastrointestinal: Negative for abdominal pain.  Psychiatric/Behavioral: Negative for behavioral problems and confusion.    Vital Signs: BP 115/67   Pulse 71   Temp 98.2 F (36.8 C) (Oral)   Resp 16   Ht 5\' 8"  (1.727 m)   Wt 172 lb (78 kg)   SpO2 100%   BMI 26.15 kg/m   Physical Exam  Constitutional: He is oriented to person, place, and time.  Cardiovascular: Normal rate and regular rhythm.  Pulmonary/Chest: Effort normal and breath sounds normal.  Abdominal: Soft. Bowel sounds are normal.  Musculoskeletal: Normal range of motion.  Neurological: He is alert and oriented to person, place, and time.  Right PCN intact  Skin: Skin is warm and dry.  Psychiatric: He has a normal mood and affect. His behavior is normal. Judgment and thought content normal.  Vitals reviewed.   Imaging: Ir Nephrostomy Exchange Left  Result Date: 08/09/2018 INDICATION: Bladder cancer, chronic nephrostomy, routine exchange EXAM: FLUOROSCOPIC EXCHANGE OF THE BILATERAL NEPHROSTOMIES Date:  08/09/2018 08/09/2018 2:39 pm Radiologist:  M. Daryll Brod, MD Guidance:  Fluoroscopic FLUOROSCOPY TIME:  0 minutes 30 seconds (5 mGy). MEDICATIONS: 1% lidocaine local ANESTHESIA/SEDATION: Moderate (conscious) sedation was employed during this procedure. A total of Versed 2 mg and Fentanyl 100 mcg was administered intravenously. Moderate Sedation Time: 10 minutes. The patient's level of consciousness and vital signs were monitored continuously by radiology  nursing throughout the procedure under my direct supervision. CONTRAST:  20 cc [id number] COMPLICATIONS: None immediate. PROCEDURE: Informed consent was obtained from the patient following explanation of the procedure, risks, benefits and alternatives. The patient understands, agrees and consents for the procedure. All questions were addressed. A time out was performed. Maximal barrier sterile technique utilized including caps, mask, sterile gowns, sterile gloves, large sterile drape, hand hygiene, and ChloraPrep. Under sterile conditions and local anesthesia, the existing nephrostomy catheters were injected to confirm position in the renal pelvis bilaterally. Nephrostomy catheters were cut and exchanged successfully over Amplatz guidewires. Contrast injection reconfirms position. Images obtained for documentation. Catheter secured with Prolene sutures and connected to external gravity drainage bag. Sterile dressing applied. No immediate complication. Patient tolerated the procedure well. IMPRESSION: Successful fluoroscopic exchange of the bilateral nephrostomy catheters Electronically Signed   By: Jerilynn Mages.  Shick M.D.   On: 08/09/2018 14:49   Ir Nephrostomy Exchange Right  Result Date: 08/09/2018 INDICATION: Bladder cancer, chronic nephrostomy, routine exchange EXAM: FLUOROSCOPIC EXCHANGE OF THE BILATERAL NEPHROSTOMIES Date:  08/09/2018 08/09/2018 2:39 pm Radiologist:  M. Daryll Brod, MD Guidance:  Fluoroscopic FLUOROSCOPY TIME:  0 minutes 30 seconds (5 mGy). MEDICATIONS: 1% lidocaine local ANESTHESIA/SEDATION: Moderate (conscious) sedation was employed during this procedure. A total of Versed 2 mg and Fentanyl 100 mcg was administered intravenously. Moderate Sedation Time: 10 minutes. The  patient's level of consciousness and vital signs were monitored continuously by radiology nursing throughout the procedure under my direct supervision. CONTRAST:  20 cc [id number] COMPLICATIONS: None immediate. PROCEDURE: Informed  consent was obtained from the patient following explanation of the procedure, risks, benefits and alternatives. The patient understands, agrees and consents for the procedure. All questions were addressed. A time out was performed. Maximal barrier sterile technique utilized including caps, mask, sterile gowns, sterile gloves, large sterile drape, hand hygiene, and ChloraPrep. Under sterile conditions and local anesthesia, the existing nephrostomy catheters were injected to confirm position in the renal pelvis bilaterally. Nephrostomy catheters were cut and exchanged successfully over Amplatz guidewires. Contrast injection reconfirms position. Images obtained for documentation. Catheter secured with Prolene sutures and connected to external gravity drainage bag. Sterile dressing applied. No immediate complication. Patient tolerated the procedure well. IMPRESSION: Successful fluoroscopic exchange of the bilateral nephrostomy catheters Electronically Signed   By: Jerilynn Mages.  Shick M.D.   On: 08/09/2018 14:49    Labs:  CBC: Recent Labs    07/05/18 0610 07/05/18 0617 07/06/18 0446 07/22/18 1226 08/12/18 1239  WBC 4.6  --  4.3 4.9 4.5  HGB 8.1* 7.1* 7.3* 8.1* 8.1*  HCT 23.8* 21.0* 22.0* 24.4* 24.0*  PLT 65*  --  61* 64* 68*    COAGS: Recent Labs    08/24/17 0948 12/03/17 0550 12/27/17 1227 07/04/18 0346  INR 1.0 1.22 1.13 1.22  APTT  --   --  33  --     BMP: Recent Labs    07/04/18 0346 07/05/18 0617 07/06/18 0446 07/22/18 1226 08/12/18 1239  NA 141 141 138 137 137  K 3.8 3.9 3.9 4.3 4.7  CL 115* 112* 113* 113* 108  CO2 20*  --  16* 18* 21*  GLUCOSE 95 99 94 92 100*  BUN 22 20 23  28* 23  CALCIUM 9.4  --  9.6 9.6 9.7  CREATININE 1.45* 1.40* 1.27* 1.70* 1.61*  GFRNONAA 44*  --  51* 36* 38*  GFRAA 51*  --  59* 42* 45*    LIVER FUNCTION TESTS: Recent Labs    06/30/18 2009 07/01/18 0448 07/22/18 1226 08/12/18 1239  BILITOT 0.4 0.9 0.3 0.4  AST 15 16 16  14*  ALT 15 13 12 12     ALKPHOS 48 42 57 61  PROT 6.6 6.0* 7.1 7.2  ALBUMIN 3.3* 3.0* 3.9 3.8    TUMOR MARKERS: No results for input(s): AFPTM, CEA, CA199, CHROMGRNA in the last 8760 hours.  Assessment and Plan:  Bladder Ca B PCNs placed 11/2017 Left has been dislodged since last pm Now for replacement Pt is aware of procedure benefits and risks including but not limited to Infection; bleeding; damage to surrounding structures Agreeable to proceed Consent signed and in chart  Thank you for this interesting consult.  I greatly enjoyed meeting KARLIN HEILMAN and look forward to participating in their care.  A copy of this report was sent to the requesting provider on this date.  Electronically Signed: Lavonia Drafts, PA-C 08/16/2018, 9:43 AM   I spent a total of    25 Minutes in face to face in clinical consultation, greater than 50% of which was counseling/coordinating care for L PCN replacement

## 2018-08-16 NOTE — ED Provider Notes (Signed)
[place] DEPT Provider Note   CSN: [id number] Arrival date & time: 08/15/18  2009     History   Chief Complaint Chief Complaint  Patient presents with  . Nephrostomy tube fell out    HPI Charles Coffey is a 83 y.o. male.  HPI  This is an 83 year old male with a history of coronary artery disease, bladder cancer status post bilateral nephrostomy tube placement and urostomy, hypertension, hyperlipidemia who presents with left nephrostomy tube displacement.  Patient routinely has his nephrostomy tubes exchanged approximately every 6 weeks.  Last change was 8/13.  Patient did report some postprocedure pain.  Wife noted this morning that he had some blood in the tubing.  She reports that he has minimal urinary output from that tube at baseline.  Tonight she went to change his dressing when she noted that the tube had come out.  He is not having any significant pain.  He has not had any fevers.  No nausea, vomiting, abdominal pain.  Past Medical History:  Diagnosis Date  . Aortic atherosclerosis ([place])   . BPH with urinary obstruction   . CAD (coronary artery disease)    a.  MI 1995, CABG x 3 2002 (patient says that he had LIMA and RIMA grafts). b. ETT-Cardiolite (10/15) with EF 48%, apical scar, no ischemia. c. Nuc 07/2017 abnormal -> cath was performed,  patent LIMA to LAD and SVG to OM 2. RIMA to RCA is atretic. The right coronary artery is occluded distally with extensive collaterals from the LAD, medical therapy.   . Cancer [facility])    bladder cancer  . Cervical spondylosis without myelopathy December 05, 202017  . Chronic low back pain    sees Dr. Kary Kos   . GERD (gastroesophageal reflux disease)   . Hiatal hernia   . Hyperlipidemia   . Hypertension   . IBS (irritable bowel syndrome)   . Ischemic cardiomyopathy   . Myocardial infarction ([place])   . RBBB   . Restless legs syndrome   . Statin intolerance   . Syncope    a. in 2015 - no apparent cause,  was taking sleep medicine at the time. Cardiac workup unremarkable.  . Tubular adenoma of colon     Patient Active Problem List   Diagnosis Date Noted  . Ventral hernia without obstruction or gangrene 07/21/2018  . Displacement of nephrostomy tube ([place]) 07/05/2018  . Nephrostomy tube displaced ([place]) 07/05/2018  . Malfunction of nephrostomy tube ([place])   . Lower urinary tract infectious disease 06/30/2018  . Port-A-Cath in place 03/31/2018  . PVC (premature ventricular contraction) 02/25/2018  . Malignant neoplasm of urinary bladder ([place]) 02/25/2018  . Catheter-associated urinary tract infection ([place]) 02/17/2018  . Nephrostomy complication ([place]) 22/29/7989  . Chronic systolic heart failure ([place]) 02/07/2018  . Malnutrition of moderate degree 02/01/2018  . Acute respiratory failure with hypoxia ([place]) 01/31/2018  . Acute CHF (congestive heart failure) ([place]) 01/31/2018  . Thrombocytopathia ([place])   . DVT (deep venous thrombosis) ([place]) 01/11/2018  . Sepsis secondary to UTI ([place]) 01/01/2018  . Colostomy in place [facility]) 12/04/2017  . Rectal stenosis   . Low vitamin B12 level 11/30/2017  . Irritable bowel syndrome with constipation   . Pyelonephritis 11/28/2017  . Fever 11/28/2017  . Ileus ([place]) 11/28/2017  . Elevated troponin 11/28/2017  . Urothelial carcinoma of bladder ([place]) 11/28/2017  . Dehydration 11/28/2017  . Thrombocytopenia ([place]) 11/28/2017  . Hydronephrosis due to obstructive malignant neoplasm of bladder ([place]) 11/25/2017  .  Anemia 09/20/2017  . Chronic kidney disease, stage 3 ([place]) 09/20/2017  . Angina pectoris ([place])   . Abnormal stress test   . Cervical spondylosis without myelopathy 2020-04-3016  . PAD (peripheral artery disease) ([place]) 03/04/2015  . Coronary artery disease involving native heart without angina pectoris 11/01/2014  . Acid reflux 11/01/2014  . H/O male genital system disorder 11/01/2014  . Restless leg 11/01/2014  . Acne erythematosa 11/01/2014  . Syncope  09/24/2014  . Chronic low back pain 11/10/2013  . Cardiomyopathy, ischemic 11/29/2012  . Hyperlipidemia LDL goal <70 10/15/2010  . RESTLESS LEGS SYNDROME 10/15/2010  . Essential hypertension 10/15/2010  . MYOCARDIAL INFARCTION, HX OF 10/15/2010  . Coronary artery disease of native heart with stable angina pectoris ([place]) 10/15/2010  . GERD 10/15/2010  . BENIGN PROSTATIC HYPERTROPHY 10/15/2010  . BENIGN PROSTATIC HYPERTROPHY, WITH OBSTRUCTION 10/15/2010  . COLONIC POLYPS, HX OF 10/15/2010    Past Surgical History:  Procedure Laterality Date  . COLONOSCOPY  10/17/2014   per Dr. Hilarie Fredrickson, tubular adenomas, repeat in 3 yrs   . COLONOSCOPY WITH PROPOFOL N/A 12/01/2017   Procedure: COLONOSCOPY WITH PROPOFOL;  Surgeon: Milus Banister, MD;  Location: [facility];  Service: Endoscopy;  Laterality: N/A;  . CYSTOSCOPY W/ RETROGRADES Left 11/25/2017   Procedure: CYSTOSCOPY WITH RETROGRADE PYELOGRAM;  Surgeon: Irine Seal, MD;  Location: [facility] ORS;  Service: Urology;  Laterality: Left;  . HEART BYPASS    . IR CONVERT RIGHT NEPHROSTOMY TO NEPHROURETERAL CATH  02/04/2018  . IR EXT NEPHROURETERAL CATH EXCHANGE  04/11/2018  . IR EXT NEPHROURETERAL CATH EXCHANGE  07/04/2018  . IR FLUORO GUIDE CV LINE RIGHT  12/27/2017  . IR NEPHROSTOGRAM LEFT THRU EXISTING ACCESS  12/24/2017  . IR NEPHROSTOMY EXCHANGE LEFT  01/28/2018  . IR NEPHROSTOMY EXCHANGE LEFT  02/04/2018  . IR NEPHROSTOMY EXCHANGE LEFT  02/25/2018  . IR NEPHROSTOMY EXCHANGE LEFT  07/04/2018  . IR NEPHROSTOMY EXCHANGE LEFT  07/05/2018  . IR NEPHROSTOMY EXCHANGE LEFT  08/09/2018  . IR NEPHROSTOMY EXCHANGE RIGHT  12/24/2017  . IR NEPHROSTOMY EXCHANGE RIGHT  01/28/2018  . IR NEPHROSTOMY EXCHANGE RIGHT  04/11/2018  . IR NEPHROSTOMY EXCHANGE RIGHT  07/05/2018  . IR NEPHROSTOMY EXCHANGE RIGHT  08/09/2018  . IR NEPHROSTOMY PLACEMENT LEFT  11/28/2017  . IR NEPHROSTOMY PLACEMENT RIGHT  12/03/2017  . IR PATIENT EVAL TECH 0-60 MINS  03/24/2018  . IR PATIENT EVAL TECH 0-60  MINS  03/28/2018  . IR PATIENT EVAL TECH 0-60 MINS  04/25/2018  . IR PATIENT EVAL TECH 0-60 MINS  06/20/2018  . IR US GUIDE VASC ACCESS RIGHT  12/27/2017  . LAPAROSCOPY N/A 12/01/2017   Procedure: LAPAROSCOPIC DIVERTING OSTOMY;  Surgeon: Stark Klein, MD;  Location: [facility]S;  Service: General;  Laterality: N/A;  . LEFT HEART CATH AND CORS/GRAFTS ANGIOGRAPHY N/A 08/25/2017   Procedure: LEFT HEART CATH AND CORS/GRAFTS ANGIOGRAPHY;  Surgeon: Wellington Hampshire, MD;  Location: [place] CV LAB;  Service: Cardiovascular;  Laterality: N/A;  . LUMBAR FUSION  2003   L3-L4  . PROSTATE SURGERY  06-27-12   per Dr. Roni Bread, had CTT  . TONSILLECTOMY    . TRANSURETHRAL RESECTION OF BLADDER TUMOR N/A 11/25/2017   Procedure: TRANSURETHRAL RESECTION OF BLADDER TUMOR (TURBT);  Surgeon: Irine Seal, MD;  Location: [facility];  Service: Urology;  Laterality: N/A;        Home Medications    Prior to Admission medications   Medication Sig Start Date End Date Taking? Authorizing Provider  acetaminophen (TYLENOL) 500 MG tablet Take 1,000 mg by mouth every 8 (eight) hours as needed for mild pain or moderate pain.   Yes [provider]  carvedilol (COREG) 6.25 MG tablet Take 3.125 mg by mouth 2 (two) times daily with a meal.   Yes [provider]  DULoxetine (CYMBALTA) 20 MG capsule Take 1 capsule (20 mg total) by mouth 2 (two) times daily. Take once daily for 2 weeks then increase to twice daily Patient taking differently: Take 20 mg by mouth See admin instructions. Take 20 mg by mouth once daily for 2 weeks then increase to twice daily 08/12/18  Yes Tanner, Lyndon Code., PA-C  feeding supplement (BOOST HIGH PROTEIN) LIQD Take 1 Container by mouth 2 (two) times daily between meals.   Yes [provider]  furosemide (LASIX) 40 MG tablet Take 1 tablet (40 mg total) by mouth 2 (two) times daily. Patient taking differently: Take 40 mg by mouth as needed for fluid or edema.  05/13/18  Yes End, Harrell Gave, MD    hydrOXYzine (ATARAX/VISTARIL) 25 MG tablet Take 1 tablet (25 mg total) by mouth every 4 (four) hours as needed for itching. 07/21/18  Yes [name], MD  lidocaine-prilocaine (EMLA) cream Apply 1 application topically as needed. Patient taking differently: Apply 1 application topically as needed (port access).  12/15/17  Yes Shadad, Mathis Dad, MD  LINZESS 290 MCG CAPS capsule TAKE 1 CAP BY MOUTH ONCE DAILY 30 MIN PRIOR TO A MEAL. MUST HAVE OFFICE VISIT FOR FURTHER REFILLS Patient taking differently: Take 290 mcg by mouth daily.  05/12/18  Yes Pyrtle, Lajuan Lines, MD  megestrol (MEGACE) 40 MG/ML suspension TAKE 10ML BY MOUTH TWICE A DAY Patient taking differently: Take 400 mg by mouth 2 (two) times daily as needed (weight gain).  05/13/18  Yes Wyatt Portela, MD  Melatonin 3 MG CAPS Take 6 mg by mouth at bedtime as needed (sleep).   Yes [provider]  mirabegron ER (MYRBETRIQ) 25 MG TB24 tablet Take 1 tablet (25 mg total) by mouth daily. 02/04/18  Yes Short, Noah Delaine, MD  oxyCODONE (OXYCONTIN) 10 mg 12 hr tablet Take 1 tablet (10 mg total) by mouth 2 (two) times daily. Patient taking differently: Take 10 mg by mouth at bedtime.  07/22/18  Yes Wyatt Portela, MD  oxyCODONE-acetaminophen (PERCOCET) 10-325 MG tablet Take 1 tablet by mouth every 4 (four) hours as needed for pain. 08/12/18  Yes Tanner, Lyndon Code., PA-C  pembrolizumab [facility]) 100 MG/4ML SOLN Given at infusion center every 3 weeks   Yes [provider]  pramipexole (MIRAPEX) 1 MG tablet TAKE 1 AND 1/2 TABLETS BY MOUTH AT BEDTIME Patient taking differently: Take 1.5 mg by mouth every evening.  06/29/18  Yes [name], MD  predniSONE (DELTASONE) 5 MG tablet Take 1 tablet (5 mg total) by mouth daily with breakfast. 08/12/18  Yes Tanner, Lyndon Code., PA-C  QUEtiapine (SEROQUEL) 25 MG tablet TAKE 1 TABLET BY MOUTH EVERYDAY AT BEDTIME Patient taking differently: Take 25 mg by mouth at bedtime.  08/03/18  Yes [name], MD   ranitidine (ZANTAC) 300 MG tablet TAKE 1 TABLET BY MOUTH TWICE A DAY Patient taking differently: Take 300 mg by mouth 2 (two) times daily.  07/11/18  Yes Irene Shipper, MD  Tetrahydroz-Glyc-Hyprom-PEG (VISINE MAXIMUM REDNESS RELIEF OP) Place 2 drops into both eyes 2 (two) times daily as needed (dry eyes).    Yes [provider]  carvedilol (COREG) 3.125  MG tablet Take 1 tablet (3.125 mg total) by mouth 2 (two) times daily. Patient not taking: Reported on 08/15/2018 07/04/18 08/15/18  Donne Hazel, MD  guaiFENesin-codeine 100-10 MG/5ML syrup Take 5 mLs by mouth every 6 (six) hours as needed for cough. Patient not taking: Reported on 07/21/2018 06/13/18   Harle Stanford., PA-C  nitroGLYCERIN (NITROSTAT) 0.4 MG SL tablet Place 1 tablet (0.4 mg total) under the tongue every 5 (five) minutes as needed. 08/19/17 06/30/18  Dunn, Nedra Hai, PA-C  prochlorperazine (COMPAZINE) 10 MG tablet Take 1 tablet (10 mg total) by mouth every 6 (six) hours as needed for nausea or vomiting. Patient not taking: Reported on 08/15/2018 12/15/17   Wyatt Portela, MD  triamcinolone cream (KENALOG) 0.1 % Apply topically 3 (three) times daily. To affected areas Patient not taking: Reported on 08/15/2018 07/04/18   Donne Hazel, MD    Family History Family History  Problem Relation Age of Onset  . Heart attack Mother   . Aneurysm Father        femoral artery  . Heart disease Brother   . Heart disease Maternal Uncle        x 2  . Colon cancer Neg Hx   . Esophageal cancer Neg Hx   . Pancreatic cancer Neg Hx   . Kidney disease Neg Hx   . Liver disease Neg Hx     Social History Social History   Tobacco Use  . Smoking status: Former Smoker    Types: Cigarettes    Last attempt to quit: 12/28/1978    Years since quitting: 39.6  . Smokeless tobacco: Never Used  Substance Use Topics  . Alcohol use: Yes    Alcohol/week: 2.0 standard drinks    Types: 1 Glasses of wine, 1 Cans of beer per week    Comment:  occassional  . Drug use: No     Allergies   Antihistamines, loratadine-type and Statins   Review of Systems Review of Systems  Constitutional: Negative for fever.  Gastrointestinal: Negative for abdominal pain, nausea and vomiting.  Musculoskeletal: Negative for back pain.  Skin: Positive for color change. Negative for wound.  All other systems reviewed and are negative.    Physical Exam Updated Vital Signs BP 115/69   Pulse 74   Temp 98.3 F (36.8 C) (Oral)   Resp (!) 21   Ht 5\' 8"  (1.727 m)   Wt 78 kg   SpO2 97%   BMI 26.15 kg/m   Physical Exam  Constitutional: He is oriented to person, place, and time. No distress.  Chronically ill-appearing but nontoxic, no acute distress  HENT:  Head: Normocephalic and atraumatic.  Neck: Neck supple.  Cardiovascular: Normal rate, regular rhythm and normal heart sounds.  Pulmonary/Chest: Effort normal and breath sounds normal. No respiratory distress.  Abdominal: Soft. There is no tenderness. There is no rebound.  Abdomen soft, urostomy mid lower abdomen  Genitourinary:  Genitourinary Comments: Left nephrostomy tube dislodged completely, mild erythema without blanching about nephrostomy tube site, no drainage or bleeding, right nephrostomy in place  Neurological: He is alert and oriented to person, place, and time.  Skin: Skin is warm and dry.  Psychiatric: He has a normal mood and affect.  Nursing note and vitals reviewed.    ED Treatments / Results  Labs (all labs ordered are listed, but only abnormal results are displayed) Labs Reviewed - No data to display  EKG None  Radiology No results found.  Procedures Procedures (  including critical care time)  Medications Ordered in ED Medications - No data to display   Initial Impression / Assessment and Plan / ED Course  I have reviewed the triage vital signs and the nursing notes.  Pertinent labs & imaging results that were available during my care of the patient  were reviewed by me and considered in my medical decision making (see chart for details).     Patient presents with dislodged left nephrostomy tube.  Wife reports at baseline that this tube does not drain much urine.  Did drain some but this morning.  He is overall nontoxic-appearing.  Afebrile.  Vital signs are reassuring.  He has some redness around the ostomy type but no signs of infection.  I touch base with IR, Dr. Barbie Banner.  He will contact interventional radiology at [facility].  A dressing was placed over the nephrostomy site.  Patient instructed to call interventional radiology at [facility] if he does not hear by 8 AM.  This was discussed with patient and his wife.  Both stated understanding.  After history, exam, and medical workup I feel the patient has been appropriately medically screened and is safe for discharge home. Pertinent diagnoses were discussed with the patient. Patient was given return precautions.   Final Clinical Impressions(s) / ED Diagnoses   Final diagnoses:  Nephrostomy tube displaced [facility])    ED Discharge Orders    None       Dina Rich, Barbette Hair, MD 08/16/18 0021

## 2018-08-17 ENCOUNTER — Telehealth: Payer: Self-pay | Admitting: *Deleted

## 2018-08-17 ENCOUNTER — Inpatient Hospital Stay: Payer: Medicare HMO

## 2018-08-17 DIAGNOSIS — D649 Anemia, unspecified: Secondary | ICD-10-CM

## 2018-08-17 MED ORDER — DIPHENHYDRAMINE HCL 25 MG PO CAPS
25.0000 mg | ORAL_CAPSULE | Freq: Once | ORAL | Status: AC
  Administered 2018-08-17: 25 mg via ORAL

## 2018-08-17 MED ORDER — SODIUM CHLORIDE 0.9 % IV SOLN
INTRAVENOUS | Status: DC
  Administered 2018-08-17: 08:00:00 via INTRAVENOUS
  Filled 2018-08-17: qty 250

## 2018-08-17 MED ORDER — ACETAMINOPHEN 325 MG PO TABS
650.0000 mg | ORAL_TABLET | Freq: Once | ORAL | Status: AC
  Administered 2018-08-17: 650 mg via ORAL

## 2018-08-17 MED ORDER — HEPARIN SOD (PORK) LOCK FLUSH 100 UNIT/ML IV SOLN
500.0000 [IU] | Freq: Every day | INTRAVENOUS | Status: AC | PRN
  Administered 2018-08-17: 500 [IU]
  Filled 2018-08-17: qty 5

## 2018-08-17 MED ORDER — DIPHENHYDRAMINE HCL 25 MG PO CAPS
ORAL_CAPSULE | ORAL | Status: AC
  Filled 2018-08-17: qty 1

## 2018-08-17 MED ORDER — ACETAMINOPHEN 325 MG PO TABS
ORAL_TABLET | ORAL | Status: AC
  Filled 2018-08-17: qty 2

## 2018-08-17 MED ORDER — SODIUM CHLORIDE 0.9% FLUSH
10.0000 mL | INTRAVENOUS | Status: AC | PRN
  Administered 2018-08-17: 10 mL
  Filled 2018-08-17: qty 10

## 2018-08-17 NOTE — Telephone Encounter (Signed)
"  Charles Coffey is not feeling well.  Having symptoms of urinary track infection.  He's been in everyday this week.  Could an antibiotic be prescribed?  He refuses to go to the hospital.   Temp was all over the place today with blood transfusion.  Oral T = 100.8  F.  Didn't know he was drinking coffee before I checked so I'll check again.  Charles Coffey has used Percocet.  I know to only give 2,000 mg Tylenol.  I'll give one.  He's weak and groggy.  Hurts all over.  Pain from yesterday's nephrostomy procedure and bladder pain.  Can I put ice packs on him to cool him off?"   Call information shared with provider.  Verbal order received and read back from Dr. [name] to increase hydration, monitor temperature, call if symptoms or fever worsen, presents and persists.   Order given to spouse [name] at this time.   Answered questions to apply cool cloths or compresses may provide comfort.  DO NOT apply ice packs or rubbing alcohol compresses.  Avoid warm beverages and coffee.  Hydrate with sports beverages, water, juice, italian ice, ice pops.  Report to ED for Elevated temperature with chills.

## 2018-08-17 NOTE — Patient Instructions (Signed)

## 2018-08-18 ENCOUNTER — Other Ambulatory Visit: Payer: Self-pay

## 2018-08-18 ENCOUNTER — Other Ambulatory Visit (HOSPITAL_COMMUNITY): Payer: Self-pay | Admitting: Interventional Radiology

## 2018-08-18 ENCOUNTER — Inpatient Hospital Stay (HOSPITAL_BASED_OUTPATIENT_CLINIC_OR_DEPARTMENT_OTHER)
Admission: EM | Admit: 2018-08-18 | Discharge: 2018-08-24 | DRG: 698 | Disposition: A | Discharge status: Home Health | Payer: Medicare HMO | Attending: Family Medicine | Admitting: Family Medicine

## 2018-08-18 ENCOUNTER — Encounter (HOSPITAL_BASED_OUTPATIENT_CLINIC_OR_DEPARTMENT_OTHER): Payer: Self-pay

## 2018-08-18 ENCOUNTER — Encounter: Payer: Self-pay | Admitting: General Practice

## 2018-08-18 DIAGNOSIS — E871 Hypo-osmolality and hyponatremia: Secondary | ICD-10-CM | POA: Diagnosis present

## 2018-08-18 DIAGNOSIS — D649 Anemia, unspecified: Secondary | ICD-10-CM | POA: Diagnosis not present

## 2018-08-18 DIAGNOSIS — R339 Retention of urine, unspecified: Secondary | ICD-10-CM | POA: Diagnosis not present

## 2018-08-18 DIAGNOSIS — M545 Low back pain: Secondary | ICD-10-CM

## 2018-08-18 DIAGNOSIS — I5022 Chronic systolic (congestive) heart failure: Secondary | ICD-10-CM | POA: Diagnosis not present

## 2018-08-18 DIAGNOSIS — B964 Proteus (mirabilis) (morganii) as the cause of diseases classified elsewhere: Secondary | ICD-10-CM | POA: Diagnosis not present

## 2018-08-18 DIAGNOSIS — N179 Acute kidney failure, unspecified: Secondary | ICD-10-CM | POA: Diagnosis not present

## 2018-08-18 DIAGNOSIS — I130 Hypertensive heart and chronic kidney disease with heart failure and stage 1 through stage 4 chronic kidney disease, or unspecified chronic kidney disease: Secondary | ICD-10-CM | POA: Diagnosis not present

## 2018-08-18 DIAGNOSIS — N183 Chronic kidney disease, stage 3 unspecified: Secondary | ICD-10-CM | POA: Diagnosis present

## 2018-08-18 DIAGNOSIS — D696 Thrombocytopenia, unspecified: Secondary | ICD-10-CM | POA: Diagnosis present

## 2018-08-18 DIAGNOSIS — T83022D Displacement of nephrostomy catheter, subsequent encounter: Secondary | ICD-10-CM | POA: Diagnosis not present

## 2018-08-18 DIAGNOSIS — G893 Neoplasm related pain (acute) (chronic): Secondary | ICD-10-CM

## 2018-08-18 DIAGNOSIS — Z66 Do not resuscitate: Secondary | ICD-10-CM

## 2018-08-18 DIAGNOSIS — I25118 Atherosclerotic heart disease of native coronary artery with other forms of angina pectoris: Secondary | ICD-10-CM | POA: Diagnosis present

## 2018-08-18 DIAGNOSIS — C679 Malignant neoplasm of bladder, unspecified: Secondary | ICD-10-CM | POA: Diagnosis present

## 2018-08-18 DIAGNOSIS — C689 Malignant neoplasm of urinary organ, unspecified: Secondary | ICD-10-CM

## 2018-08-18 DIAGNOSIS — T83512A Infection and inflammatory reaction due to nephrostomy catheter, initial encounter: Secondary | ICD-10-CM

## 2018-08-18 DIAGNOSIS — T83092A Other mechanical complication of nephrostomy catheter, initial encounter: Secondary | ICD-10-CM | POA: Diagnosis not present

## 2018-08-18 DIAGNOSIS — T83022A Displacement of nephrostomy catheter, initial encounter: Secondary | ICD-10-CM | POA: Diagnosis not present

## 2018-08-18 DIAGNOSIS — N1330 Unspecified hydronephrosis: Secondary | ICD-10-CM | POA: Diagnosis not present

## 2018-08-18 DIAGNOSIS — Z436 Encounter for attention to other artificial openings of urinary tract: Secondary | ICD-10-CM | POA: Diagnosis not present

## 2018-08-18 DIAGNOSIS — I10 Essential (primary) hypertension: Secondary | ICD-10-CM | POA: Diagnosis present

## 2018-08-18 DIAGNOSIS — N99528 Other complication of other external stoma of urinary tract: Secondary | ICD-10-CM | POA: Diagnosis not present

## 2018-08-18 DIAGNOSIS — A419 Sepsis, unspecified organism: Secondary | ICD-10-CM | POA: Diagnosis present

## 2018-08-18 DIAGNOSIS — G8929 Other chronic pain: Secondary | ICD-10-CM | POA: Diagnosis present

## 2018-08-18 DIAGNOSIS — N390 Urinary tract infection, site not specified: Secondary | ICD-10-CM | POA: Diagnosis present

## 2018-08-18 DIAGNOSIS — Z515 Encounter for palliative care: Secondary | ICD-10-CM

## 2018-08-18 DIAGNOSIS — R451 Restlessness and agitation: Secondary | ICD-10-CM

## 2018-08-18 DIAGNOSIS — N320 Bladder-neck obstruction: Secondary | ICD-10-CM | POA: Diagnosis not present

## 2018-08-18 DIAGNOSIS — I313 Pericardial effusion (noninflammatory): Secondary | ICD-10-CM | POA: Diagnosis not present

## 2018-08-18 DIAGNOSIS — R410 Disorientation, unspecified: Secondary | ICD-10-CM | POA: Diagnosis not present

## 2018-08-18 LAB — BASIC METABOLIC PANEL
Anion gap: 11 (ref 5–15)
BUN: 30 mg/dL — AB (ref 8–23)
CALCIUM: 9.3 mg/dL (ref 8.9–10.3)
CO2: 18 mmol/L — ABNORMAL LOW (ref 22–32)
CREATININE: 1.77 mg/dL — AB (ref 0.61–1.24)
Chloride: 99 mmol/L (ref 98–111)
GFR calc Af Amer: 40 mL/min — ABNORMAL LOW (ref >60)
GFR, EST NON AFRICAN AMERICAN: 34 mL/min — AB (ref >60)
Glucose, Bld: 126 mg/dL — ABNORMAL HIGH (ref 70–99)
POTASSIUM: 3.8 mmol/L (ref 3.5–5.1)
SODIUM: 128 mmol/L — AB (ref 135–145)

## 2018-08-18 LAB — BPAM RBC
Blood Product Expiration Date: 201909122359
Blood Product Expiration Date: 201909122359
ISSUE DATE / TIME: 201908210824
ISSUE DATE / TIME: 201908210824
UNIT TYPE AND RH: 6200
Unit Type and Rh: 6200

## 2018-08-18 LAB — TYPE AND SCREEN
ABO/RH(D): A POS
Antibody Screen: NEGATIVE
UNIT DIVISION: 0
Unit division: 0

## 2018-08-18 MED ORDER — SODIUM CHLORIDE 0.9 % IV SOLN
1.0000 g | Freq: Once | INTRAVENOUS | Status: AC
  Administered 2018-08-19: 1 g via INTRAVENOUS
  Filled 2018-08-18: qty 10

## 2018-08-18 MED ORDER — LIDOCAINE HCL URETHRAL/MUCOSAL 2 % EX GEL
CUTANEOUS | Status: AC
  Administered 2018-08-19
  Filled 2018-08-18: qty 20

## 2018-08-18 MED ORDER — FENTANYL CITRATE (PF) 100 MCG/2ML IJ SOLN
50.0000 ug | Freq: Once | INTRAMUSCULAR | Status: AC
  Administered 2018-08-18: 50 ug via INTRAVENOUS
  Filled 2018-08-18: qty 2

## 2018-08-18 NOTE — ED Notes (Signed)
Coude catheter insertion attempted x 3 with no success, Dr. Randal Buba notified and Urology consulted. Pt and wife oriented about possibility of pt need to be transferred to [facility] or [facility] for urologist service. Wife requesting some pain relief before his is transfer; pt and wife oriented that mostly his discomfort is related to the urine retention and the only relief is to get the urine out of his bladder by craterization and that is the reason to have pt transfer ASAP to be able to help him. Wife still expressing concern about pain control and doesn't verbalized understanding. Dr. Randal Buba notified.

## 2018-08-18 NOTE — ED Notes (Signed)
Verbal order received from MD [facility] for insertion of foley catheter

## 2018-08-18 NOTE — ED Triage Notes (Signed)
Pt is due to have IR procedure for nephrostomy tube placement. Pt feels as though bladder is full.

## 2018-08-18 NOTE — ED Provider Notes (Addendum)
[facility] EMERGENCY DEPARTMENT Provider Note   CSN: [id number] Arrival date & time: 08/18/18  2228     History   Chief Complaint Chief Complaint  Patient presents with  . Urinary Retention    HPI Charles Coffey is a 83 y.o. male.  The history is provided by the patient and the spouse. The history is limited by the condition of the patient.  Illness  This is a recurrent problem. The current episode started 6 to 12 hours ago. The problem occurs constantly. The problem has not changed since onset.Associated symptoms include abdominal pain. Pertinent negatives include no chest pain, no headaches and no shortness of breath. Nothing aggravates the symptoms. Nothing relieves the symptoms. He has tried nothing for the symptoms. The treatment provided no relief.  Patient with urothelial carcinoma and dementia with behavioral disturbance who presents as Right nephrostomy tube is out and patient reportedly does not drain much from the left tube at baseline.  Reportedly right drain came out following placement.  Family called oncology yesterday to report global weakness and sensation of UTI with fever of 100.8 at home.  Came in for pain and pressure in suprapubic area this evening.  No appreciable urine drainage in hours.    Past Medical History:  Diagnosis Date  . Aortic atherosclerosis ([place])   . BPH with urinary obstruction   . CAD (coronary artery disease)    a.  MI 1995, CABG x 3 2002 (patient says that he had LIMA and RIMA grafts). b. ETT-Cardiolite (10/15) with EF 48%, apical scar, no ischemia. c. Nuc 07/2017 abnormal -> cath was performed,  patent LIMA to LAD and SVG to OM 2. RIMA to RCA is atretic. The right coronary artery is occluded distally with extensive collaterals from the LAD, medical therapy.   . Cancer [facility])    bladder cancer  . Cervical spondylosis without myelopathy May 18, 202017  . Chronic low back pain    sees Dr. Kary Kos   . GERD (gastroesophageal reflux  disease)   . Hiatal hernia   . Hyperlipidemia   . Hypertension   . IBS (irritable bowel syndrome)   . Ischemic cardiomyopathy   . Myocardial infarction ([place])   . RBBB   . Restless legs syndrome   . Statin intolerance   . Syncope    a. in 2015 - no apparent cause, was taking sleep medicine at the time. Cardiac workup unremarkable.  . Tubular adenoma of colon     Patient Active Problem List   Diagnosis Date Noted  . Ventral hernia without obstruction or gangrene 07/21/2018  . Displacement of nephrostomy tube ([place]) 07/05/2018  . Nephrostomy tube displaced ([place]) 07/05/2018  . Malfunction of nephrostomy tube ([place])   . Lower urinary tract infectious disease 06/30/2018  . Port-A-Cath in place 03/31/2018  . PVC (premature ventricular contraction) 02/25/2018  . Malignant neoplasm of urinary bladder ([place]) 02/25/2018  . Catheter-associated urinary tract infection ([place]) 02/17/2018  . Nephrostomy complication ([place]) 63/87/5643  . Chronic systolic heart failure ([place]) 02/07/2018  . Malnutrition of moderate degree 02/01/2018  . Acute respiratory failure with hypoxia ([place]) 01/31/2018  . Acute CHF (congestive heart failure) ([place]) 01/31/2018  . Thrombocytopathia ([place])   . DVT (deep venous thrombosis) ([place]) 01/11/2018  . Sepsis secondary to UTI ([place]) 01/01/2018  . Colostomy in place [facility]) 12/04/2017  . Rectal stenosis   . Low vitamin B12 level 11/30/2017  . Irritable bowel syndrome with constipation   . Pyelonephritis 11/28/2017  . Fever 11/28/2017  .  Ileus ([place]) 11/28/2017  . Elevated troponin 11/28/2017  . Urothelial carcinoma of bladder ([place]) 11/28/2017  . Dehydration 11/28/2017  . Thrombocytopenia ([place]) 11/28/2017  . Hydronephrosis due to obstructive malignant neoplasm of bladder ([place]) 11/25/2017  . Anemia 09/20/2017  . Chronic kidney disease, stage 3 ([place]) 09/20/2017  . Angina pectoris ([place])   . Abnormal stress test   . Cervical spondylosis without myelopathy November 13, 202017  . PAD  (peripheral artery disease) ([place]) 03/04/2015  . Coronary artery disease involving native heart without angina pectoris 11/01/2014  . Acid reflux 11/01/2014  . H/O male genital system disorder 11/01/2014  . Restless leg 11/01/2014  . Acne erythematosa 11/01/2014  . Syncope 09/24/2014  . Chronic low back pain 11/10/2013  . Cardiomyopathy, ischemic 11/29/2012  . Hyperlipidemia LDL goal <70 10/15/2010  . RESTLESS LEGS SYNDROME 10/15/2010  . Essential hypertension 10/15/2010  . MYOCARDIAL INFARCTION, HX OF 10/15/2010  . Coronary artery disease of native heart with stable angina pectoris ([place]) 10/15/2010  . GERD 10/15/2010  . BENIGN PROSTATIC HYPERTROPHY 10/15/2010  . BENIGN PROSTATIC HYPERTROPHY, WITH OBSTRUCTION 10/15/2010  . COLONIC POLYPS, HX OF 10/15/2010    Past Surgical History:  Procedure Laterality Date  . COLONOSCOPY  10/17/2014   per Dr. Hilarie Fredrickson, tubular adenomas, repeat in 3 yrs   . COLONOSCOPY WITH PROPOFOL N/A 12/01/2017   Procedure: COLONOSCOPY WITH PROPOFOL;  Surgeon: Milus Banister, MD;  Location: [facility];  Service: Endoscopy;  Laterality: N/A;  . CYSTOSCOPY W/ RETROGRADES Left 11/25/2017   Procedure: CYSTOSCOPY WITH RETROGRADE PYELOGRAM;  Surgeon: Irine Seal, MD;  Location: [facility] ORS;  Service: Urology;  Laterality: Left;  . HEART BYPASS    . IR CONVERT RIGHT NEPHROSTOMY TO NEPHROURETERAL CATH  02/04/2018  . IR EXT NEPHROURETERAL CATH EXCHANGE  04/11/2018  . IR EXT NEPHROURETERAL CATH EXCHANGE  07/04/2018  . IR FLUORO GUIDE CV LINE RIGHT  12/27/2017  . IR NEPHROSTOGRAM LEFT THRU EXISTING ACCESS  12/24/2017  . IR NEPHROSTOMY EXCHANGE LEFT  01/28/2018  . IR NEPHROSTOMY EXCHANGE LEFT  02/04/2018  . IR NEPHROSTOMY EXCHANGE LEFT  02/25/2018  . IR NEPHROSTOMY EXCHANGE LEFT  07/04/2018  . IR NEPHROSTOMY EXCHANGE LEFT  07/05/2018  . IR NEPHROSTOMY EXCHANGE LEFT  08/09/2018  . IR NEPHROSTOMY EXCHANGE LEFT  08/16/2018  . IR NEPHROSTOMY EXCHANGE RIGHT  12/24/2017  . IR NEPHROSTOMY  EXCHANGE RIGHT  01/28/2018  . IR NEPHROSTOMY EXCHANGE RIGHT  04/11/2018  . IR NEPHROSTOMY EXCHANGE RIGHT  07/05/2018  . IR NEPHROSTOMY EXCHANGE RIGHT  08/09/2018  . IR NEPHROSTOMY PLACEMENT LEFT  11/28/2017  . IR NEPHROSTOMY PLACEMENT RIGHT  12/03/2017  . IR PATIENT EVAL TECH 0-60 MINS  03/24/2018  . IR PATIENT EVAL TECH 0-60 MINS  03/28/2018  . IR PATIENT EVAL TECH 0-60 MINS  04/25/2018  . IR PATIENT EVAL TECH 0-60 MINS  06/20/2018  . IR US GUIDE VASC ACCESS RIGHT  12/27/2017  . LAPAROSCOPY N/A 12/01/2017   Procedure: LAPAROSCOPIC DIVERTING OSTOMY;  Surgeon: Stark Klein, MD;  Location: [facility];  Service: General;  Laterality: N/A;  . LEFT HEART CATH AND CORS/GRAFTS ANGIOGRAPHY N/A 08/25/2017   Procedure: LEFT HEART CATH AND CORS/GRAFTS ANGIOGRAPHY;  Surgeon: Wellington Hampshire, MD;  Location: [facility] CV LAB;  Service: Cardiovascular;  Laterality: N/A;  . LUMBAR FUSION  2003   L3-L4  . PROSTATE SURGERY  06-27-12   per Dr. Roni Bread, had CTT  . TONSILLECTOMY    . TRANSURETHRAL RESECTION OF BLADDER TUMOR N/A 11/25/2017   Procedure: TRANSURETHRAL RESECTION  OF BLADDER TUMOR (TURBT);  Surgeon: Irine Seal, MD;  Location: [facility];  Service: Urology;  Laterality: N/A;        Home Medications    Prior to Admission medications   Medication Sig Start Date End Date Taking? Authorizing Provider  acetaminophen (TYLENOL) 500 MG tablet Take 1,000 mg by mouth every 8 (eight) hours as needed for mild pain or moderate pain.    [provider]  carvedilol (COREG) 3.125 MG tablet Take 1 tablet (3.125 mg total) by mouth 2 (two) times daily. Patient not taking: Reported on 08/15/2018 07/04/18 08/15/18  Donne Hazel, MD  carvedilol (COREG) 6.25 MG tablet Take 3.125 mg by mouth 2 (two) times daily with a meal.    [provider]  DULoxetine (CYMBALTA) 20 MG capsule Take 1 capsule (20 mg total) by mouth 2 (two) times daily. Take once daily for 2 weeks then increase to twice daily Patient taking differently:  Take 20 mg by mouth See admin instructions. Take 20 mg by mouth once daily for 2 weeks then increase to twice daily 08/12/18   Sandi Mealy E., PA-C  feeding supplement (BOOST HIGH PROTEIN) LIQD Take 1 Container by mouth 2 (two) times daily between meals.    [provider]  furosemide (LASIX) 40 MG tablet Take 1 tablet (40 mg total) by mouth 2 (two) times daily. Patient taking differently: Take 40 mg by mouth as needed for fluid or edema.  05/13/18   End, Harrell Gave, MD  guaiFENesin-codeine 100-10 MG/5ML syrup Take 5 mLs by mouth every 6 (six) hours as needed for cough. 06/13/18   Tanner, Lyndon Code., PA-C  hydrOXYzine (ATARAX/VISTARIL) 25 MG tablet Take 1 tablet (25 mg total) by mouth every 4 (four) hours as needed for itching. 07/21/18   [name], MD  lidocaine-prilocaine (EMLA) cream Apply 1 application topically as needed. Patient taking differently: Apply 1 application topically as needed (port access).  12/15/17   Wyatt Portela, MD  LINZESS 290 MCG CAPS capsule TAKE 1 CAP BY MOUTH ONCE DAILY 30 MIN PRIOR TO A MEAL. MUST HAVE OFFICE VISIT FOR FURTHER REFILLS Patient taking differently: Take 290 mcg by mouth daily.  05/12/18   Pyrtle, Lajuan Lines, MD  megestrol (MEGACE) 40 MG/ML suspension TAKE 10ML BY MOUTH TWICE A DAY Patient taking differently: Take 400 mg by mouth 2 (two) times daily as needed (weight gain).  05/13/18   Wyatt Portela, MD  Melatonin 3 MG CAPS Take 6 mg by mouth at bedtime as needed (sleep).    [provider]  mirabegron ER (MYRBETRIQ) 25 MG TB24 tablet Take 1 tablet (25 mg total) by mouth daily. 02/04/18   Janece Canterbury, MD  nitroGLYCERIN (NITROSTAT) 0.4 MG SL tablet Place 1 tablet (0.4 mg total) under the tongue every 5 (five) minutes as needed. 08/19/17 06/30/18  Dunn, Nedra Hai, PA-C  oxyCODONE (OXYCONTIN) 10 mg 12 hr tablet Take 1 tablet (10 mg total) by mouth 2 (two) times daily. Patient taking differently: Take 10 mg by mouth at bedtime.  07/22/18   Wyatt Portela, MD  oxyCODONE-acetaminophen (PERCOCET) 10-325 MG tablet Take 1 tablet by mouth every 4 (four) hours as needed for pain. 08/12/18   Tanner, Lyndon Code., PA-C  pembrolizumab [facility]) 100 MG/4ML SOLN Given at infusion center every 3 weeks    [provider]  pramipexole (MIRAPEX) 1 MG tablet TAKE 1 AND 1/2 TABLETS BY MOUTH AT BEDTIME Patient taking differently: Take 1.5 mg by mouth every  evening.  06/29/18   [name], MD  predniSONE (DELTASONE) 5 MG tablet Take 1 tablet (5 mg total) by mouth daily with breakfast. 08/12/18   Tanner, Lyndon Code., PA-C  prochlorperazine (COMPAZINE) 10 MG tablet Take 1 tablet (10 mg total) by mouth every 6 (six) hours as needed for nausea or vomiting. Patient not taking: Reported on 08/15/2018 12/15/17   Wyatt Portela, MD  QUEtiapine (SEROQUEL) 25 MG tablet TAKE 1 TABLET BY MOUTH EVERYDAY AT BEDTIME Patient taking differently: Take 25 mg by mouth at bedtime.  08/03/18   [name], MD  ranitidine (ZANTAC) 300 MG tablet TAKE 1 TABLET BY MOUTH TWICE A DAY Patient taking differently: Take 300 mg by mouth 2 (two) times daily.  07/11/18   Irene Shipper, MD  Tetrahydroz-Glyc-Hyprom-PEG (VISINE MAXIMUM REDNESS RELIEF OP) Place 2 drops into both eyes 2 (two) times daily as needed (dry eyes).     [provider]  triamcinolone cream (KENALOG) 0.1 % Apply topically 3 (three) times daily. To affected areas Patient not taking: Reported on 08/15/2018 07/04/18   Donne Hazel, MD    Family History Family History  Problem Relation Age of Onset  . Heart attack Mother   . Aneurysm Father        femoral artery  . Heart disease Brother   . Heart disease Maternal Uncle        x 2  . Colon cancer Neg Hx   . Esophageal cancer Neg Hx   . Pancreatic cancer Neg Hx   . Kidney disease Neg Hx   . Liver disease Neg Hx     Social History Social History   Tobacco Use  . Smoking status: Former Smoker    Types: Cigarettes    Last attempt to quit: 12/28/1978    Years  since quitting: 39.6  . Smokeless tobacco: Never Used  Substance Use Topics  . Alcohol use: Not Currently    Alcohol/week: 2.0 standard drinks    Types: 1 Glasses of wine, 1 Cans of beer per week    Comment: occassional  . Drug use: No     Allergies   Antihistamines, loratadine-type and Statins   Review of Systems Review of Systems  Respiratory: Negative for shortness of breath.   Cardiovascular: Negative for chest pain.  Gastrointestinal: Positive for abdominal pain.  Neurological: Negative for headaches.  All other systems reviewed and are negative.    Physical Exam Updated Vital Signs BP 138/82 (BP Location: Left Arm)   Pulse (!) 110   Temp 98.2 F (36.8 C) (Oral)   Resp 20   SpO2 100%   Physical Exam  Constitutional: He appears well-developed and well-nourished.  HENT:  Head: Normocephalic and atraumatic.  Nose: Nose normal.  Eyes: Conjunctivae and EOM are normal.  Neck: Normal range of motion. Neck supple.  Cardiovascular: Regular rhythm, normal heart sounds and intact distal pulses. Tachycardia present.  Pulmonary/Chest: Effort normal and breath sounds normal. No stridor. No respiratory distress. He has no wheezes. He has no rales.  Abdominal: Soft. Bowel sounds are normal. He exhibits distension. He exhibits no mass. There is tenderness. There is no guarding.  Musculoskeletal: Normal range of motion.  Neurological: He is alert.  Skin: Skin is warm and dry. Capillary refill takes less than 2 seconds. He is not diaphoretic.  Psychiatric: His mood appears anxious. He is agitated.     ED Treatments / Results  Labs (all labs ordered are listed, but only abnormal  results are displayed) Results for orders placed or performed during the hospital encounter of 08/18/18  CBC with Differential/Platelet  Result Value Ref Range   WBC 10.8 (H) 4.0 - 10.5 K/uL   RBC 3.29 (L) 4.22 - 5.81 MIL/uL   Hemoglobin 10.7 (L) 13.0 - 17.0 g/dL   HCT 30.4 (L) 39.0 - 52.0 %    MCV 92.4 78.0 - 100.0 fL   MCH 32.5 26.0 - 34.0 pg   MCHC 35.2 30.0 - 36.0 g/dL   RDW 17.1 (H) 11.5 - 15.5 %   Platelets 55 (L) 150 - 400 K/uL   Neutrophils Relative % 78 %   Lymphocytes Relative 19 %   Monocytes Relative 3 %   Eosinophils Relative 0 %   Basophils Relative 0 %   Neutro Abs 8.4 (H) 1.7 - 7.7 K/uL   Lymphs Abs 2.1 0.7 - 4.0 K/uL   Monocytes Absolute 0.3 0.1 - 1.0 K/uL   Eosinophils Absolute 0.0 0.0 - 0.7 K/uL   Basophils Absolute 0.0 0.0 - 0.1 K/uL   RBC Morphology ELLIPTOCYTES    WBC Morphology WHITE COUNT CONFIRMED ON SMEAR    Smear Review PLATELET COUNT CONFIRMED BY SMEAR   Basic metabolic panel  Result Value Ref Range   Sodium 128 (L) 135 - 145 mmol/L   Potassium 3.8 3.5 - 5.1 mmol/L   Chloride 99 98 - 111 mmol/L   CO2 18 (L) 22 - 32 mmol/L   Glucose, Bld 126 (H) 70 - 99 mg/dL   BUN 30 (H) 8 - 23 mg/dL   Creatinine, Ser 1.77 (H) 0.61 - 1.24 mg/dL   Calcium 9.3 8.9 - 10.3 mg/dL   GFR calc non Af Amer 34 (L) >60 mL/min   GFR calc Af Amer 40 (L) >60 mL/min   Anion gap 11 5 - 15   Ir Nephrostomy Exchange Left  Result Date: 08/16/2018 INDICATION: Indwelling left-sided percutaneous nephrostomy tube has become completely dislodged and requires replacement. EXAM: REPLACEMENT OF LEFT PERCUTANEOUS NEPHROSTOMY TUBE UNDER FLUOROSCOPY COMPARISON:  08/09/2018 MEDICATIONS: None ANESTHESIA/SEDATION: Fentanyl 100 mcg IV; Versed 3.0 mg IV Moderate Sedation Time:  10 minutes. The patient was continuously monitored during the procedure by the interventional radiology nurse under my direct supervision. CONTRAST:  5 mL Isovue-300-administered into the collecting system(s) FLUOROSCOPY TIME:  Fluoroscopy Time: 54 seconds.  5.2 mGy. COMPLICATIONS: None immediate. PROCEDURE: Informed written consent was obtained from the patient after a thorough discussion of the procedural risks, benefits and alternatives. All questions were addressed. Maximal Sterile Barrier Technique was utilized  including caps, mask, sterile gowns, sterile gloves, sterile drape, hand hygiene and skin antiseptic. A timeout was performed prior to the initiation of the procedure. A 5 French catheter was advanced through the pre-existing left percutaneous nephrostomy tube tract. Diluted contrast was injected to confirm positioning within the renal collecting system. The catheter was removed over a guidewire. The percutaneous tract was dilated with both 10 [place] and 12 [place] dilators. A new 12 French percutaneous nephrostomy tube was then advanced over the guidewire and formed. The catheter was injected with diluted contrast to confirm position and a fluoroscopic spot image obtained. The catheter was secured at the skin exit site with a Prolene retention suture and attached to a gravity drainage bag. FINDINGS: The percutaneous tract was catheterized to the level of the renal collecting system. The tract had to be dilated in order to allow advancement of a new nephrostomy tube. The new 5 French catheter was formed in the renal  pelvis. IMPRESSION: Replacement of dislodged left-sided nephrostomy tube with new 12 French nephrostomy tube formed in the renal pelvis. This catheter was attached to gravity bag drainage. Electronically Signed   By: Aletta Edouard M.D.   On: 08/16/2018 11:05   Ir Nephrostomy Exchange Left  Result Date: 08/09/2018 INDICATION: Bladder cancer, chronic nephrostomy, routine exchange EXAM: FLUOROSCOPIC EXCHANGE OF THE BILATERAL NEPHROSTOMIES Date:  08/09/2018 08/09/2018 2:39 pm Radiologist:  M. Daryll Brod, MD Guidance:  Fluoroscopic FLUOROSCOPY TIME:  0 minutes 30 seconds (5 mGy). MEDICATIONS: 1% lidocaine local ANESTHESIA/SEDATION: Moderate (conscious) sedation was employed during this procedure. A total of Versed 2 mg and Fentanyl 100 mcg was administered intravenously. Moderate Sedation Time: 10 minutes. The patient's level of consciousness and vital signs were monitored continuously by radiology  nursing throughout the procedure under my direct supervision. CONTRAST:  20 cc [id number] COMPLICATIONS: None immediate. PROCEDURE: Informed consent was obtained from the patient following explanation of the procedure, risks, benefits and alternatives. The patient understands, agrees and consents for the procedure. All questions were addressed. A time out was performed. Maximal barrier sterile technique utilized including caps, mask, sterile gowns, sterile gloves, large sterile drape, hand hygiene, and ChloraPrep. Under sterile conditions and local anesthesia, the existing nephrostomy catheters were injected to confirm position in the renal pelvis bilaterally. Nephrostomy catheters were cut and exchanged successfully over Amplatz guidewires. Contrast injection reconfirms position. Images obtained for documentation. Catheter secured with Prolene sutures and connected to external gravity drainage bag. Sterile dressing applied. No immediate complication. Patient tolerated the procedure well. IMPRESSION: Successful fluoroscopic exchange of the bilateral nephrostomy catheters Electronically Signed   By: Jerilynn Mages.  Shick M.D.   On: 08/09/2018 14:49   Ir Nephrostomy Exchange Right  Result Date: 08/09/2018 INDICATION: Bladder cancer, chronic nephrostomy, routine exchange EXAM: FLUOROSCOPIC EXCHANGE OF THE BILATERAL NEPHROSTOMIES Date:  08/09/2018 08/09/2018 2:39 pm Radiologist:  M. Daryll Brod, MD Guidance:  Fluoroscopic FLUOROSCOPY TIME:  0 minutes 30 seconds (5 mGy). MEDICATIONS: 1% lidocaine local ANESTHESIA/SEDATION: Moderate (conscious) sedation was employed during this procedure. A total of Versed 2 mg and Fentanyl 100 mcg was administered intravenously. Moderate Sedation Time: 10 minutes. The patient's level of consciousness and vital signs were monitored continuously by radiology nursing throughout the procedure under my direct supervision. CONTRAST:  20 cc [id number] COMPLICATIONS: None immediate. PROCEDURE: Informed  consent was obtained from the patient following explanation of the procedure, risks, benefits and alternatives. The patient understands, agrees and consents for the procedure. All questions were addressed. A time out was performed. Maximal barrier sterile technique utilized including caps, mask, sterile gowns, sterile gloves, large sterile drape, hand hygiene, and ChloraPrep. Under sterile conditions and local anesthesia, the existing nephrostomy catheters were injected to confirm position in the renal pelvis bilaterally. Nephrostomy catheters were cut and exchanged successfully over Amplatz guidewires. Contrast injection reconfirms position. Images obtained for documentation. Catheter secured with Prolene sutures and connected to external gravity drainage bag. Sterile dressing applied. No immediate complication. Patient tolerated the procedure well. IMPRESSION: Successful fluoroscopic exchange of the bilateral nephrostomy catheters Electronically Signed   By: Jerilynn Mages.  Shick M.D.   On: 08/09/2018 14:49    Radiology No results found.  Procedures Procedures (including critical care time)  Medications Ordered in ED Results for orders placed or performed during the hospital encounter of 08/18/18  CBC with Differential/Platelet  Result Value Ref Range   WBC 10.8 (H) 4.0 - 10.5 K/uL   RBC 3.29 (L) 4.22 - 5.81 MIL/uL   Hemoglobin 10.7 (L) 13.0 -  17.0 g/dL   HCT 30.4 (L) 39.0 - 52.0 %   MCV 92.4 78.0 - 100.0 fL   MCH 32.5 26.0 - 34.0 pg   MCHC 35.2 30.0 - 36.0 g/dL   RDW 17.1 (H) 11.5 - 15.5 %   Platelets 55 (L) 150 - 400 K/uL   Neutrophils Relative % 78 %   Lymphocytes Relative 19 %   Monocytes Relative 3 %   Eosinophils Relative 0 %   Basophils Relative 0 %   Neutro Abs 8.4 (H) 1.7 - 7.7 K/uL   Lymphs Abs 2.1 0.7 - 4.0 K/uL   Monocytes Absolute 0.3 0.1 - 1.0 K/uL   Eosinophils Absolute 0.0 0.0 - 0.7 K/uL   Basophils Absolute 0.0 0.0 - 0.1 K/uL   RBC Morphology ELLIPTOCYTES    WBC Morphology  WHITE COUNT CONFIRMED ON SMEAR    Smear Review PLATELET COUNT CONFIRMED BY SMEAR   Urinalysis, Routine w reflex microscopic  Result Value Ref Range   Color, Urine RED (A) YELLOW   APPearance CLOUDY (A) CLEAR   Specific Gravity, Urine 1.015 1.005 - 1.030   pH 7.5 5.0 - 8.0   Glucose, UA NEGATIVE NEGATIVE mg/dL   Hgb urine dipstick LARGE (A) NEGATIVE   Bilirubin Urine SMALL (A) NEGATIVE   Ketones, ur NEGATIVE NEGATIVE mg/dL   Protein, ur >300 (A) NEGATIVE mg/dL   Nitrite POSITIVE (A) NEGATIVE   Leukocytes, UA LARGE (A) NEGATIVE  Basic metabolic panel  Result Value Ref Range   Sodium 128 (L) 135 - 145 mmol/L   Potassium 3.8 3.5 - 5.1 mmol/L   Chloride 99 98 - 111 mmol/L   CO2 18 (L) 22 - 32 mmol/L   Glucose, Bld 126 (H) 70 - 99 mg/dL   BUN 30 (H) 8 - 23 mg/dL   Creatinine, Ser 1.77 (H) 0.61 - 1.24 mg/dL   Calcium 9.3 8.9 - 10.3 mg/dL   GFR calc non Af Amer 34 (L) >60 mL/min   GFR calc Af Amer 40 (L) >60 mL/min   Anion gap 11 5 - 15  Urinalysis, Microscopic (reflex)  Result Value Ref Range   RBC / HPF 21-50 0 - 5 RBC/hpf   WBC, UA >50 0 - 5 WBC/hpf   Bacteria, UA MANY (A) NONE SEEN   Squamous Epithelial / LPF 0-5 0 - 5   Urine-Other LESS THAN 10 mL OF URINE SUBMITTED    ED Meds:  Medications  lidocaine (XYLOCAINE) 2 % jelly (has no administration in time range)  0.9 %  sodium chloride infusion (500 mLs Intravenous New Bag/Given 08/19/18 0030)  vancomycin (VANCOCIN) IVPB 1000 mg/200 mL premix (has no administration in time range)  ceFEPIme (MAXIPIME) 1 g in sodium chloride 0.9 % 100 mL IVPB (has no administration in time range)  fentaNYL (SUBLIMAZE) injection 50 mcg (50 mcg Intravenous Given 08/18/18 2333)  cefTRIAXone (ROCEPHIN) 1 g in sodium chloride 0.9 % 100 mL IVPB ( Intravenous Rate/Dose Verify 08/19/18 0103)  fentaNYL (SUBLIMAZE) injection 50 mcg (50 mcg Intravenous Given 08/19/18 0036)    1134: Case d/w Dr. Larinda Buttery given type of cancer foley cannot be placed.    12 am  Case d/w Dr. Anselm Pancoast who agrees to replace nephrostomy tube    Have treated for UTI.  Patient has intractable pain despite medication    1215 Wife now stating nephrostomy tube came out this evening and not after placement    Wife is stating it "not fair that this problem has not been resolved  immediately.  Can't you just knock him out."  Nurse has explained multiple times that we do not sedated patients for pain.    Final Clinical Impressions(s) / ED Diagnoses   Will admit for line infection/UTI with AKI IR to be consulted on arrival for replacement of nephrostomy tube     Berdina Cheever, MD 08/19/18 0103    Charleston Vierling, MD 08/19/18 0116

## 2018-08-18 NOTE — ED Notes (Addendum)
Paged Interventional Radiology (Dr. Reesa Chew) @   [phone]

## 2018-08-18 NOTE — Progress Notes (Signed)
[place] [facility] Spiritual Care Note  Reached wife Charles Coffey by phone to offer emotional support through recent stressors, as referred by [employer]. Consulting also with [name]/LCSW re community-based support resources such as [facility]. Per Charles Coffey, one of her sons is visiting, and her niece is arriving shortly, to help--not just to visit. Per Charles Coffey, with nephrostomy tubes, high and low temps, ED/hospital visits, and colostomy bag have frayed the couple further. She is overwhelmed by caregiving responsibilities and fatigue. We plan to f/u by phone next week to explore needs/resources in more detail.   [place], [place], [facility] Pager [phone] Voicemail [phone]

## 2018-08-19 ENCOUNTER — Inpatient Hospital Stay (HOSPITAL_COMMUNITY): Payer: Medicare HMO

## 2018-08-19 ENCOUNTER — Ambulatory Visit (HOSPITAL_COMMUNITY): Payer: Medicare HMO

## 2018-08-19 ENCOUNTER — Encounter (HOSPITAL_COMMUNITY): Payer: Self-pay | Admitting: Family Medicine

## 2018-08-19 DIAGNOSIS — E872 Acidosis: Secondary | ICD-10-CM | POA: Diagnosis not present

## 2018-08-19 DIAGNOSIS — Z515 Encounter for palliative care: Secondary | ICD-10-CM | POA: Diagnosis not present

## 2018-08-19 DIAGNOSIS — N138 Other obstructive and reflux uropathy: Secondary | ICD-10-CM | POA: Diagnosis not present

## 2018-08-19 DIAGNOSIS — I25118 Atherosclerotic heart disease of native coronary artery with other forms of angina pectoris: Secondary | ICD-10-CM

## 2018-08-19 DIAGNOSIS — C678 Malignant neoplasm of overlapping sites of bladder: Secondary | ICD-10-CM | POA: Diagnosis not present

## 2018-08-19 DIAGNOSIS — R69 Illness, unspecified: Secondary | ICD-10-CM | POA: Diagnosis not present

## 2018-08-19 DIAGNOSIS — I255 Ischemic cardiomyopathy: Secondary | ICD-10-CM | POA: Diagnosis present

## 2018-08-19 DIAGNOSIS — F05 Delirium due to known physiological condition: Secondary | ICD-10-CM | POA: Diagnosis present

## 2018-08-19 DIAGNOSIS — E871 Hypo-osmolality and hyponatremia: Secondary | ICD-10-CM | POA: Diagnosis not present

## 2018-08-19 DIAGNOSIS — N390 Urinary tract infection, site not specified: Secondary | ICD-10-CM

## 2018-08-19 DIAGNOSIS — R338 Other retention of urine: Secondary | ICD-10-CM | POA: Diagnosis not present

## 2018-08-19 DIAGNOSIS — A419 Sepsis, unspecified organism: Secondary | ICD-10-CM | POA: Diagnosis not present

## 2018-08-19 DIAGNOSIS — A4102 Sepsis due to Methicillin resistant Staphylococcus aureus: Secondary | ICD-10-CM | POA: Diagnosis not present

## 2018-08-19 DIAGNOSIS — C679 Malignant neoplasm of bladder, unspecified: Secondary | ICD-10-CM

## 2018-08-19 DIAGNOSIS — G8929 Other chronic pain: Secondary | ICD-10-CM

## 2018-08-19 DIAGNOSIS — I5022 Chronic systolic (congestive) heart failure: Secondary | ICD-10-CM | POA: Diagnosis not present

## 2018-08-19 DIAGNOSIS — N320 Bladder-neck obstruction: Secondary | ICD-10-CM | POA: Diagnosis not present

## 2018-08-19 DIAGNOSIS — N1330 Unspecified hydronephrosis: Secondary | ICD-10-CM | POA: Diagnosis not present

## 2018-08-19 DIAGNOSIS — I313 Pericardial effusion (noninflammatory): Secondary | ICD-10-CM | POA: Diagnosis not present

## 2018-08-19 DIAGNOSIS — Z66 Do not resuscitate: Secondary | ICD-10-CM | POA: Diagnosis not present

## 2018-08-19 DIAGNOSIS — T451X5A Adverse effect of antineoplastic and immunosuppressive drugs, initial encounter: Secondary | ICD-10-CM | POA: Diagnosis present

## 2018-08-19 DIAGNOSIS — I130 Hypertensive heart and chronic kidney disease with heart failure and stage 1 through stage 4 chronic kidney disease, or unspecified chronic kidney disease: Secondary | ICD-10-CM | POA: Diagnosis not present

## 2018-08-19 DIAGNOSIS — I5042 Chronic combined systolic (congestive) and diastolic (congestive) heart failure: Secondary | ICD-10-CM | POA: Diagnosis not present

## 2018-08-19 DIAGNOSIS — G893 Neoplasm related pain (acute) (chronic): Secondary | ICD-10-CM | POA: Diagnosis not present

## 2018-08-19 DIAGNOSIS — M545 Low back pain: Secondary | ICD-10-CM | POA: Diagnosis not present

## 2018-08-19 DIAGNOSIS — T83512A Infection and inflammatory reaction due to nephrostomy catheter, initial encounter: Secondary | ICD-10-CM

## 2018-08-19 DIAGNOSIS — N136 Pyonephrosis: Secondary | ICD-10-CM | POA: Diagnosis not present

## 2018-08-19 DIAGNOSIS — Z466 Encounter for fitting and adjustment of urinary device: Secondary | ICD-10-CM | POA: Diagnosis not present

## 2018-08-19 DIAGNOSIS — E440 Moderate protein-calorie malnutrition: Secondary | ICD-10-CM | POA: Diagnosis present

## 2018-08-19 DIAGNOSIS — D630 Anemia in neoplastic disease: Secondary | ICD-10-CM | POA: Diagnosis present

## 2018-08-19 DIAGNOSIS — F0391 Unspecified dementia with behavioral disturbance: Secondary | ICD-10-CM | POA: Diagnosis present

## 2018-08-19 DIAGNOSIS — T83022A Displacement of nephrostomy catheter, initial encounter: Principal | ICD-10-CM

## 2018-08-19 DIAGNOSIS — I10 Essential (primary) hypertension: Secondary | ICD-10-CM

## 2018-08-19 DIAGNOSIS — N3289 Other specified disorders of bladder: Secondary | ICD-10-CM | POA: Diagnosis not present

## 2018-08-19 DIAGNOSIS — Z436 Encounter for attention to other artificial openings of urinary tract: Secondary | ICD-10-CM | POA: Diagnosis not present

## 2018-08-19 DIAGNOSIS — D649 Anemia, unspecified: Secondary | ICD-10-CM

## 2018-08-19 DIAGNOSIS — R339 Retention of urine, unspecified: Secondary | ICD-10-CM | POA: Diagnosis not present

## 2018-08-19 DIAGNOSIS — N183 Chronic kidney disease, stage 3 (moderate): Secondary | ICD-10-CM | POA: Diagnosis not present

## 2018-08-19 DIAGNOSIS — T83092A Other mechanical complication of nephrostomy catheter, initial encounter: Secondary | ICD-10-CM | POA: Diagnosis not present

## 2018-08-19 DIAGNOSIS — D696 Thrombocytopenia, unspecified: Secondary | ICD-10-CM | POA: Diagnosis not present

## 2018-08-19 DIAGNOSIS — E785 Hyperlipidemia, unspecified: Secondary | ICD-10-CM | POA: Diagnosis present

## 2018-08-19 DIAGNOSIS — E861 Hypovolemia: Secondary | ICD-10-CM | POA: Diagnosis present

## 2018-08-19 DIAGNOSIS — D6959 Other secondary thrombocytopenia: Secondary | ICD-10-CM | POA: Diagnosis present

## 2018-08-19 DIAGNOSIS — I700 Atherosclerosis of aorta: Secondary | ICD-10-CM | POA: Diagnosis present

## 2018-08-19 DIAGNOSIS — Y732 Prosthetic and other implants, materials and accessory gastroenterology and urology devices associated with adverse incidents: Secondary | ICD-10-CM | POA: Diagnosis not present

## 2018-08-19 DIAGNOSIS — R451 Restlessness and agitation: Secondary | ICD-10-CM | POA: Diagnosis not present

## 2018-08-19 HISTORY — PX: IR NEPHROSTOMY EXCHANGE RIGHT: IMG6070

## 2018-08-19 HISTORY — PX: IR FLUORO GUIDED NEEDLE PLC ASPIRATION/INJECTION LOC: IMG2395

## 2018-08-19 LAB — URINALYSIS, MICROSCOPIC (REFLEX)

## 2018-08-19 LAB — CBC WITH DIFFERENTIAL/PLATELET
Basophils Absolute: 0 10*3/uL (ref 0.0–0.1)
Basophils Absolute: 0 10*3/uL (ref 0.0–0.1)
Basophils Relative: 0 %
Basophils Relative: 0 %
EOS ABS: 0 10*3/uL (ref 0.0–0.7)
EOS PCT: 0 %
Eosinophils Absolute: 0 10*3/uL (ref 0.0–0.7)
Eosinophils Relative: 0 %
HEMATOCRIT: 30.5 % — AB (ref 39.0–52.0)
HEMATOCRIT: 30.4 % — AB (ref 39.0–52.0)
HEMOGLOBIN: 10.5 g/dL — AB (ref 13.0–17.0)
Hemoglobin: 10.7 g/dL — ABNORMAL LOW (ref 13.0–17.0)
LYMPHS ABS: 0.8 10*3/uL (ref 0.7–4.0)
LYMPHS ABS: 2.1 10*3/uL (ref 0.7–4.0)
Lymphocytes Relative: 19 %
Lymphocytes Relative: 7 %
MCH: 31.7 pg (ref 26.0–34.0)
MCH: 32.5 pg (ref 26.0–34.0)
MCHC: 34.4 g/dL (ref 30.0–36.0)
MCHC: 35.2 g/dL (ref 30.0–36.0)
MCV: 92.1 fL (ref 78.0–100.0)
MCV: 92.4 fL (ref 78.0–100.0)
MONO ABS: 0.7 10*3/uL (ref 0.1–1.0)
MONO ABS: 0.3 10*3/uL (ref 0.1–1.0)
MONOS PCT: 6 %
Monocytes Relative: 3 %
NEUTROS ABS: 8.4 10*3/uL — AB (ref 1.7–7.7)
NEUTROS PCT: 87 %
Neutro Abs: 9.8 10*3/uL — ABNORMAL HIGH (ref 1.7–7.7)
Neutrophils Relative %: 78 %
Platelets: 55 10*3/uL — ABNORMAL LOW (ref 150–400)
Platelets: 62 10*3/uL — ABNORMAL LOW (ref 150–400)
RBC: 3.31 MIL/uL — ABNORMAL LOW (ref 4.22–5.81)
RBC: 3.29 MIL/uL — AB (ref 4.22–5.81)
RDW: 17.1 % — ABNORMAL HIGH (ref 11.5–15.5)
RDW: 17.5 % — AB (ref 11.5–15.5)
WBC: 11.3 10*3/uL — ABNORMAL HIGH (ref 4.0–10.5)
WBC: 10.8 10*3/uL — AB (ref 4.0–10.5)

## 2018-08-19 LAB — BASIC METABOLIC PANEL
Anion gap: 12 (ref 5–15)
BUN: 32 mg/dL — ABNORMAL HIGH (ref 8–23)
CALCIUM: 9.3 mg/dL (ref 8.9–10.3)
CHLORIDE: 105 mmol/L (ref 98–111)
CO2: 17 mmol/L — AB (ref 22–32)
CREATININE: 1.77 mg/dL — AB (ref 0.61–1.24)
GFR calc non Af Amer: 34 mL/min — ABNORMAL LOW (ref >60)
GFR, EST AFRICAN AMERICAN: 40 mL/min — AB (ref >60)
GLUCOSE: 136 mg/dL — AB (ref 70–99)
Potassium: 4 mmol/L (ref 3.5–5.1)
Sodium: 134 mmol/L — ABNORMAL LOW (ref 135–145)

## 2018-08-19 LAB — TYPE AND SCREEN
ABO/RH(D): A POS
Antibody Screen: NEGATIVE

## 2018-08-19 LAB — MRSA PCR SCREENING: MRSA by PCR: POSITIVE — AB

## 2018-08-19 LAB — URINALYSIS, ROUTINE W REFLEX MICROSCOPIC
GLUCOSE, UA: NEGATIVE mg/dL
Ketones, ur: NEGATIVE mg/dL
Nitrite: POSITIVE — AB
PH: 7.5 (ref 5.0–8.0)
Protein, ur: >300 mg/dL — AB
SPECIFIC GRAVITY, URINE: 1.015 (ref 1.005–1.030)

## 2018-08-19 LAB — LACTIC ACID, PLASMA: LACTIC ACID, VENOUS: 0.8 mmol/L (ref 0.5–1.9)

## 2018-08-19 LAB — APTT: aPTT: 36 seconds (ref 24–36)

## 2018-08-19 LAB — PROTIME-INR
INR: 1.23
PROTHROMBIN TIME: 15.4 s — AB (ref 11.4–15.2)

## 2018-08-19 MED ORDER — DULOXETINE HCL 20 MG PO CPEP
20.0000 mg | ORAL_CAPSULE | Freq: Every day | ORAL | Status: DC
  Administered 2018-08-20 – 2018-08-24 (×5): 20 mg via ORAL
  Filled 2018-08-19 (×6): qty 1

## 2018-08-19 MED ORDER — LINACLOTIDE 145 MCG PO CAPS
290.0000 ug | ORAL_CAPSULE | Freq: Every day | ORAL | Status: DC
  Administered 2018-08-23: 290 ug via ORAL
  Filled 2018-08-19 (×6): qty 2

## 2018-08-19 MED ORDER — GUAIFENESIN-CODEINE 100-10 MG/5ML PO SOLN: 5.0000 mL | Freq: Four times a day (QID) | ORAL | Status: DC | PRN

## 2018-08-19 MED ORDER — IOPAMIDOL (ISOVUE-300) INJECTION 61%
INTRAVENOUS | Status: AC
  Administered 2018-08-19: 15 mL
  Filled 2018-08-19: qty 50

## 2018-08-19 MED ORDER — ONDANSETRON HCL 4 MG/2ML IJ SOLN
4.0000 mg | Freq: Four times a day (QID) | INTRAMUSCULAR | Status: DC | PRN
  Administered 2018-08-20: 4 mg via INTRAVENOUS
  Filled 2018-08-19: qty 2

## 2018-08-19 MED ORDER — VANCOMYCIN HCL IN DEXTROSE 750-5 MG/150ML-% IV SOLN
750.0000 mg | INTRAVENOUS | Status: DC
  Administered 2018-08-20 – 2018-08-21 (×2): 750 mg via INTRAVENOUS
  Filled 2018-08-19 (×2): qty 150

## 2018-08-19 MED ORDER — ACETAMINOPHEN 650 MG RE SUPP: 650.0000 mg | Freq: Four times a day (QID) | RECTAL | Status: DC | PRN

## 2018-08-19 MED ORDER — POLYVINYL ALCOHOL 1.4 % OP SOLN: Freq: Two times a day (BID) | OPHTHALMIC | Status: DC | PRN

## 2018-08-19 MED ORDER — HYDROXYZINE HCL 25 MG PO TABS
25.0000 mg | ORAL_TABLET | ORAL | Status: DC | PRN
  Administered 2018-08-20 – 2018-08-22 (×7): 25 mg via ORAL
  Filled 2018-08-19 (×7): qty 1

## 2018-08-19 MED ORDER — IOPAMIDOL (ISOVUE-300) INJECTION 61%
50.0000 mL | Freq: Once | INTRAVENOUS | Status: AC | PRN
  Administered 2018-08-19: 15 mL

## 2018-08-19 MED ORDER — MORPHINE SULFATE (PF) 2 MG/ML IV SOLN
2.0000 mg | INTRAVENOUS | Status: DC | PRN
  Administered 2018-08-19 (×3): 2 mg via INTRAVENOUS
  Filled 2018-08-19 (×4): qty 1

## 2018-08-19 MED ORDER — MELATONIN 3 MG PO TABS
6.0000 mg | ORAL_TABLET | Freq: Every evening | ORAL | Status: DC | PRN
  Filled 2018-08-19: qty 2

## 2018-08-19 MED ORDER — FENTANYL CITRATE (PF) 100 MCG/2ML IJ SOLN
50.0000 ug | Freq: Once | INTRAMUSCULAR | Status: AC
  Administered 2018-08-19: 50 ug via INTRAVENOUS
  Filled 2018-08-19: qty 2

## 2018-08-19 MED ORDER — FENTANYL CITRATE (PF) 100 MCG/2ML IJ SOLN
50.0000 ug | Freq: Once | INTRAMUSCULAR | Status: AC
  Administered 2018-08-19: 50 ug via INTRAVENOUS

## 2018-08-19 MED ORDER — CHLORHEXIDINE GLUCONATE CLOTH 2 % EX PADS
6.0000 | MEDICATED_PAD | Freq: Every day | CUTANEOUS | Status: AC
  Administered 2018-08-20 – 2018-08-24 (×4): 6 via TOPICAL

## 2018-08-19 MED ORDER — CEFEPIME HCL 1 G IJ SOLR
INTRAMUSCULAR | Status: AC
  Administered 2018-08-19: 1000 mg
  Filled 2018-08-19: qty 1

## 2018-08-19 MED ORDER — BOOST HIGH PROTEIN PO LIQD
1.0000 | Freq: Two times a day (BID) | ORAL | Status: DC
  Filled 2018-08-19: qty 237

## 2018-08-19 MED ORDER — SODIUM CHLORIDE 0.9 % IV SOLN
INTRAVENOUS | Status: DC
  Administered 2018-08-19 (×3): via INTRAVENOUS

## 2018-08-19 MED ORDER — SODIUM CHLORIDE 0.9 % IV SOLN
INTRAVENOUS | Status: DC | PRN
  Administered 2018-08-19: 250 mL via INTRAVENOUS
  Administered 2018-08-19: 1000 mL via INTRAVENOUS
  Administered 2018-08-21: 500 mL via INTRAVENOUS

## 2018-08-19 MED ORDER — MEGESTROL ACETATE 40 MG/ML PO SUSP
400.0000 mg | Freq: Two times a day (BID) | ORAL | Status: DC
  Administered 2018-08-19 – 2018-08-24 (×9): 400 mg via ORAL
  Filled 2018-08-19 (×11): qty 10

## 2018-08-19 MED ORDER — FENTANYL CITRATE (PF) 100 MCG/2ML IJ SOLN
100.0000 ug | Freq: Once | INTRAMUSCULAR | Status: AC
  Administered 2018-08-19: 100 ug via INTRAVENOUS
  Filled 2018-08-19: qty 2

## 2018-08-19 MED ORDER — OXYCODONE HCL ER 10 MG PO T12A
10.0000 mg | EXTENDED_RELEASE_TABLET | Freq: Every day | ORAL | Status: DC
  Administered 2018-08-20 – 2018-08-23 (×4): 10 mg via ORAL
  Filled 2018-08-19 (×5): qty 1

## 2018-08-19 MED ORDER — SODIUM CHLORIDE 0.9 % IV SOLN
INTRAVENOUS | Status: DC
  Administered 2018-08-19: 04:00:00 via INTRAVENOUS

## 2018-08-19 MED ORDER — SODIUM CHLORIDE 0.9 % IV SOLN
1.0000 g | Freq: Once | INTRAVENOUS | Status: AC
  Administered 2018-08-19: 1 g via INTRAVENOUS

## 2018-08-19 MED ORDER — ONDANSETRON HCL 4 MG PO TABS: 4.0000 mg | ORAL_TABLET | Freq: Four times a day (QID) | ORAL | Status: DC | PRN

## 2018-08-19 MED ORDER — OXYCODONE-ACETAMINOPHEN 5-325 MG PO TABS
1.0000 | ORAL_TABLET | Freq: Four times a day (QID) | ORAL | Status: DC | PRN
  Administered 2018-08-23 – 2018-08-24 (×2): 1 via ORAL
  Filled 2018-08-19 (×2): qty 1

## 2018-08-19 MED ORDER — FAMOTIDINE IN NACL 20-0.9 MG/50ML-% IV SOLN
20.0000 mg | Freq: Two times a day (BID) | INTRAVENOUS | Status: DC
  Administered 2018-08-19 – 2018-08-22 (×8): 20 mg via INTRAVENOUS
  Filled 2018-08-19 (×8): qty 50

## 2018-08-19 MED ORDER — MIDAZOLAM HCL 2 MG/2ML IJ SOLN
INTRAMUSCULAR | Status: AC
  Filled 2018-08-19: qty 4

## 2018-08-19 MED ORDER — VANCOMYCIN HCL IN DEXTROSE 1-5 GM/200ML-% IV SOLN: 1000.0000 mg | Freq: Once | INTRAVENOUS | Status: DC

## 2018-08-19 MED ORDER — LIDOCAINE HCL 1 % IJ SOLN
INTRAMUSCULAR | Status: AC
  Filled 2018-08-19: qty 20

## 2018-08-19 MED ORDER — HYDROMORPHONE HCL 1 MG/ML IJ SOLN: 0.5000 mg | INTRAMUSCULAR | Status: DC | PRN

## 2018-08-19 MED ORDER — SODIUM CHLORIDE 0.9 % IV SOLN
INTRAVENOUS | Status: DC | PRN
  Administered 2018-08-19 (×2): 500 mL via INTRAVENOUS

## 2018-08-19 MED ORDER — MUPIROCIN 2 % EX OINT
1.0000 "application " | TOPICAL_OINTMENT | Freq: Two times a day (BID) | CUTANEOUS | Status: AC
  Administered 2018-08-19 – 2018-08-23 (×10): 1 via NASAL
  Filled 2018-08-19: qty 22

## 2018-08-19 MED ORDER — MIDAZOLAM HCL 2 MG/2ML IJ SOLN
INTRAMUSCULAR | Status: AC | PRN
  Administered 2018-08-19 (×2): 1 mg via INTRAVENOUS

## 2018-08-19 MED ORDER — ACETAMINOPHEN 325 MG PO TABS
650.0000 mg | ORAL_TABLET | Freq: Four times a day (QID) | ORAL | Status: DC | PRN
  Administered 2018-08-19 – 2018-08-20 (×2): 650 mg via ORAL
  Filled 2018-08-19 (×2): qty 2

## 2018-08-19 MED ORDER — ENSURE ENLIVE PO LIQD: 237.0000 mL | Freq: Two times a day (BID) | ORAL | Status: DC

## 2018-08-19 MED ORDER — VANCOMYCIN HCL 10 G IV SOLR
1500.0000 mg | Freq: Once | INTRAVENOUS | Status: AC
  Administered 2018-08-19: 1500 mg via INTRAVENOUS
  Filled 2018-08-19: qty 1500

## 2018-08-19 MED ORDER — PRAMIPEXOLE DIHYDROCHLORIDE 1 MG PO TABS
1.5000 mg | ORAL_TABLET | Freq: Every evening | ORAL | Status: DC
  Administered 2018-08-19 – 2018-08-23 (×5): 1.5 mg via ORAL
  Filled 2018-08-19 (×7): qty 2

## 2018-08-19 MED ORDER — QUETIAPINE FUMARATE 50 MG PO TABS
25.0000 mg | ORAL_TABLET | Freq: Every day | ORAL | Status: DC
Start: 2018-08-19 — End: 2018-08-20
  Administered 2018-08-19: 25 mg via ORAL
  Filled 2018-08-19: qty 1

## 2018-08-19 MED ORDER — LIDOCAINE VISCOUS HCL 2 % MT SOLN
OROMUCOSAL | Status: AC
  Filled 2018-08-19: qty 15

## 2018-08-19 MED ORDER — SODIUM CHLORIDE 0.9% FLUSH
3.0000 mL | Freq: Two times a day (BID) | INTRAVENOUS | Status: DC
  Administered 2018-08-19 – 2018-08-24 (×7): 3 mL via INTRAVENOUS

## 2018-08-19 MED ORDER — OXYCODONE HCL 5 MG PO TABS
5.0000 mg | ORAL_TABLET | Freq: Four times a day (QID) | ORAL | Status: DC | PRN
  Administered 2018-08-20 – 2018-08-24 (×3): 5 mg via ORAL
  Filled 2018-08-19 (×3): qty 1

## 2018-08-19 MED ORDER — SODIUM CHLORIDE 0.9 % IV SOLN
2.0000 g | Freq: Two times a day (BID) | INTRAVENOUS | Status: DC
  Administered 2018-08-19: 2 g via INTRAVENOUS
  Filled 2018-08-19: qty 2

## 2018-08-19 MED ORDER — LORAZEPAM 2 MG/ML IJ SOLN
0.5000 mg | INTRAMUSCULAR | Status: DC | PRN
  Administered 2018-08-19 – 2018-08-23 (×3): 1 mg via INTRAVENOUS
  Filled 2018-08-19 (×3): qty 1

## 2018-08-19 MED ORDER — CARVEDILOL 3.125 MG PO TABS
3.1250 mg | ORAL_TABLET | Freq: Two times a day (BID) | ORAL | Status: DC
  Administered 2018-08-19 – 2018-08-24 (×10): 3.125 mg via ORAL
  Filled 2018-08-19 (×10): qty 1

## 2018-08-19 MED ORDER — FENTANYL CITRATE (PF) 100 MCG/2ML IJ SOLN
INTRAMUSCULAR | Status: AC
  Filled 2018-08-19: qty 4

## 2018-08-19 MED ORDER — PREDNISONE 5 MG PO TABS
5.0000 mg | ORAL_TABLET | Freq: Every day | ORAL | Status: DC
  Administered 2018-08-19 – 2018-08-24 (×6): 5 mg via ORAL
  Filled 2018-08-19 (×6): qty 1

## 2018-08-19 MED ORDER — LIDOCAINE HCL (PF) 1 % IJ SOLN
INTRAMUSCULAR | Status: AC | PRN
  Administered 2018-08-19: 5 mL
  Administered 2018-08-19: 10 mL

## 2018-08-19 MED ORDER — LIDOCAINE-PRILOCAINE 2.5-2.5 % EX CREA
1.0000 "application " | TOPICAL_CREAM | CUTANEOUS | Status: DC | PRN
  Administered 2018-08-21 (×2): 1 via TOPICAL

## 2018-08-19 MED ORDER — FENTANYL CITRATE (PF) 100 MCG/2ML IJ SOLN
INTRAMUSCULAR | Status: AC | PRN
  Administered 2018-08-19 (×2): 50 ug via INTRAVENOUS

## 2018-08-19 MED ORDER — OXYCODONE-ACETAMINOPHEN 10-325 MG PO TABS: 1.0000 | ORAL_TABLET | Freq: Four times a day (QID) | ORAL | Status: DC | PRN

## 2018-08-19 MED ORDER — FENTANYL CITRATE (PF) 100 MCG/2ML IJ SOLN
INTRAMUSCULAR | Status: AC
  Filled 2018-08-19: qty 2

## 2018-08-19 MED ORDER — METHOCARBAMOL 1000 MG/10ML IJ SOLN
1000.0000 mg | Freq: Once | INTRAMUSCULAR | Status: DC | PRN
  Filled 2018-08-19: qty 10

## 2018-08-19 MED ORDER — SODIUM CHLORIDE 0.9 % IV SOLN
2.0000 g | INTRAVENOUS | Status: DC
  Administered 2018-08-20 – 2018-08-21 (×2): 2 g via INTRAVENOUS
  Filled 2018-08-19 (×2): qty 2

## 2018-08-19 NOTE — Progress Notes (Signed)
Events noted over the last 24 hours.  Patient hospitalized for urinary sepsis and nephrostomy tube malfunction.  He is currently receiving empiric antibiotics with Rocephin and vancomycin.  He is a physically and interventional radiology for a replacement of right nephrostomy tube and possible suprapubic catheter placement.  I agree with the current management plan and no further input from oncology standpoint.  We will continue to check on him periodically while he is hospitalized for any additional input.

## 2018-08-19 NOTE — Procedures (Signed)
BLADDER CA, HYDRO, AND URINARY RETENTION  S/P FLUORO RT PCN REPLACMENT  S/P SP TUBE INSERTION  NO COMP STABLE EBL MIN FULL REPORT IN PACS

## 2018-08-19 NOTE — Progress Notes (Signed)
MRSA PCR Positive, orders placed.

## 2018-08-19 NOTE — Consult Note (Signed)
Urology Consult  Referring physician: Georgena Spurling Reason for referral: urine retention  Chief Complaint: urine retention   History of Present Illness: Patient will known to Dr. [name].  He has metastatic bladder cancer primarily at the trigone with bilateral hydronephrosis and bilateral nephrostomy tubes.  2 days ago 1 nephrostomy tube had to be replaced.  He came in acute urinary retention with pain in suprapubic area as well as flank discomfort with one tube not working well.  When I approach the patient he was on his way to interventional radiology and I delayed this to try to place a Foley catheter.  A Foley catheter could not be placed in the emergency room.  He had a Foley catheter placed in our office about a year ago.  He had a condom catheter on 420 mL.  He also had soaked a pad.  He still felt uncomfortable.  Patient had been scanned for 600 ml prior.  Modifying factors: There are no other modifying factors  Associated signs and symptoms: There are no other associated signs and symptoms Aggravating and relieving factors: There are no other aggravating or relieving factors Severity: Moderate Duration: Persistent  Past Medical History:  Diagnosis Date  . Aortic atherosclerosis ([place])   . BPH with urinary obstruction   . CAD (coronary artery disease)    a.  MI 1995, CABG x 3 2002 (patient says that he had LIMA and RIMA grafts). b. ETT-Cardiolite (10/15) with EF 48%, apical scar, no ischemia. c. Nuc 07/2017 abnormal -> cath was performed,  patent LIMA to LAD and SVG to OM 2. RIMA to RCA is atretic. The right coronary artery is occluded distally with extensive collaterals from the LAD, medical therapy.   . Cancer [facility])    bladder cancer  . Cervical spondylosis without myelopathy 07-14-2016  . Chronic low back pain    sees Dr. Kary Kos   . GERD (gastroesophageal reflux disease)   . Hiatal hernia   . Hyperlipidemia   . Hypertension   . IBS (irritable bowel syndrome)   . Ischemic  cardiomyopathy   . Myocardial infarction ([place])   . RBBB   . Restless legs syndrome   . Statin intolerance   . Syncope    a. in 2015 - no apparent cause, was taking sleep medicine at the time. Cardiac workup unremarkable.  . Tubular adenoma of colon    Past Surgical History:  Procedure Laterality Date  . COLONOSCOPY  10/17/2014   per Dr. Hilarie Fredrickson, tubular adenomas, repeat in 3 yrs   . COLONOSCOPY WITH PROPOFOL N/A 12/01/2017   Procedure: COLONOSCOPY WITH PROPOFOL;  Surgeon: Milus Banister, MD;  Location: [facility];  Service: Endoscopy;  Laterality: N/A;  . CYSTOSCOPY W/ RETROGRADES Left 11/25/2017   Procedure: CYSTOSCOPY WITH RETROGRADE PYELOGRAM;  Surgeon: Irine Seal, MD;  Location: [facility];  Service: Urology;  Laterality: Left;  . HEART BYPASS    . IR CONVERT RIGHT NEPHROSTOMY TO NEPHROURETERAL CATH  02/04/2018  . IR EXT NEPHROURETERAL CATH EXCHANGE  04/11/2018  . IR EXT NEPHROURETERAL CATH EXCHANGE  07/04/2018  . IR FLUORO GUIDE CV LINE RIGHT  12/27/2017  . IR NEPHROSTOGRAM LEFT THRU EXISTING ACCESS  12/24/2017  . IR NEPHROSTOMY EXCHANGE LEFT  01/28/2018  . IR NEPHROSTOMY EXCHANGE LEFT  02/04/2018  . IR NEPHROSTOMY EXCHANGE LEFT  02/25/2018  . IR NEPHROSTOMY EXCHANGE LEFT  07/04/2018  . IR NEPHROSTOMY EXCHANGE LEFT  07/05/2018  . IR NEPHROSTOMY EXCHANGE LEFT  08/09/2018  . IR NEPHROSTOMY EXCHANGE  LEFT  08/16/2018  . IR NEPHROSTOMY EXCHANGE RIGHT  12/24/2017  . IR NEPHROSTOMY EXCHANGE RIGHT  01/28/2018  . IR NEPHROSTOMY EXCHANGE RIGHT  04/11/2018  . IR NEPHROSTOMY EXCHANGE RIGHT  07/05/2018  . IR NEPHROSTOMY EXCHANGE RIGHT  08/09/2018  . IR NEPHROSTOMY PLACEMENT LEFT  11/28/2017  . IR NEPHROSTOMY PLACEMENT RIGHT  12/03/2017  . IR PATIENT EVAL TECH 0-60 MINS  03/24/2018  . IR PATIENT EVAL TECH 0-60 MINS  03/28/2018  . IR PATIENT EVAL TECH 0-60 MINS  04/25/2018  . IR PATIENT EVAL TECH 0-60 MINS  06/20/2018  . IR US GUIDE VASC ACCESS RIGHT  12/27/2017  . LAPAROSCOPY N/A 12/01/2017   Procedure:  LAPAROSCOPIC DIVERTING OSTOMY;  Surgeon: Stark Klein, MD;  Location[facility];  Service: General;  Laterality: N/A;  . LEFT HEART CATH AND CORS/GRAFTS ANGIOGRAPHY N/A 08/25/2017   Procedure: LEFT HEART CATH AND CORS/GRAFTS ANGIOGRAPHY;  Surgeon: Wellington Hampshire, MD;  Location: [facility];  Service: Cardiovascular;  Laterality: N/A;  . LUMBAR FUSION  2003   L3-L4  . PROSTATE SURGERY  06-27-12   per Dr. Roni Bread, had CTT  . TONSILLECTOMY    . TRANSURETHRAL RESECTION OF BLADDER TUMOR N/A 11/25/2017   Procedure: TRANSURETHRAL RESECTION OF BLADDER TUMOR (TURBT);  Surgeon: Irine Seal, MD;  Location[facility];  Service: Urology;  Laterality: N/A;    Medications: I have reviewed the patient's current medications. Allergies:  Allergies  Allergen Reactions  . Statins Other (See Comments)    liver effects    Family History  Problem Relation Age of Onset  . Heart attack Mother   . Aneurysm Father        femoral artery  . Heart disease Brother   . Heart disease Maternal Uncle        x 2  . Colon cancer Neg Hx   . Esophageal cancer Neg Hx   . Pancreatic cancer Neg Hx   . Kidney disease Neg Hx   . Liver disease Neg Hx    Social History:  reports that he quit smoking about 39 years ago. His smoking use included cigarettes. He has never used smokeless tobacco. He reports that he drank about 2.0 standard drinks of alcohol per week. He reports that he does not use drugs.  ROS: All systems are reviewed and negative except as noted. Rest negative  Physical Exam:  Vital signs in last 24 hours: Temp:  [97.9 F (36.6 C)-98.8 F (37.1 C)] 97.9 F (36.6 C) (08/23 0300) Pulse Rate:  [67-110] 94 (08/23 0601) Resp:  [11-28] 21 (08/23 0601) BP: (128-175)/(69-101) 148/69 (08/23 0601) SpO2:  [93 %-100 %] 95 % (08/23 0601) Weight:  [80.2 kg] 80.2 kg (08/23 0300)  Cardiovascular: Skin warm; not flushed Respiratory: Breaths quiet; no shortness of breath Abdomen: No masses Neurological: Normal  sensation to touch Musculoskeletal: Normal motor function arms and legs Lymphatics: No inguinal adenopathy Skin: No rashes Genitourinary: I could feel a moderately distended bladder and suprapubic area.  No suprapubic incision.  Colostomy high in upper abdomen.  Laboratory Data:  Results for orders placed or performed during the hospital encounter of 08/18/18 (from the past 72 hour(s))  CBC with Differential/Platelet     Status: Abnormal   Collection Time: 08/18/18 11:31 PM  Result Value Ref Range   WBC 10.8 (H) 4.0 - 10.5 K/uL   RBC 3.29 (L) 4.22 - 5.81 MIL/uL   Hemoglobin 10.7 (L) 13.0 - 17.0 g/dL   HCT 30.4 (L) 39.0 -  52.0 %   MCV 92.4 78.0 - 100.0 fL   MCH 32.5 26.0 - 34.0 pg   MCHC 35.2 30.0 - 36.0 g/dL   RDW 17.1 (H) 11.5 - 15.5 %   Platelets 55 (L) 150 - 400 K/uL    Comment: SPECIMEN CHECKED FOR CLOTS REPEATED TO VERIFY    Neutrophils Relative % 78 %   Lymphocytes Relative 19 %   Monocytes Relative 3 %   Eosinophils Relative 0 %   Basophils Relative 0 %   Neutro Abs 8.4 (H) 1.7 - 7.7 K/uL   Lymphs Abs 2.1 0.7 - 4.0 K/uL   Monocytes Absolute 0.3 0.1 - 1.0 K/uL   Eosinophils Absolute 0.0 0.0 - 0.7 K/uL   Basophils Absolute 0.0 0.0 - 0.1 K/uL   RBC Morphology ELLIPTOCYTES     Comment: TEARDROP CELLS   WBC Morphology WHITE COUNT CONFIRMED ON SMEAR    Smear Review PLATELET COUNT CONFIRMED BY SMEAR     Comment: Performed at [facility], [address] [place], [place] [address]  Basic metabolic panel     Status: Abnormal   Collection Time: 08/18/18 11:31 PM  Result Value Ref Range   Sodium 128 (L) 135 - 145 mmol/L   Potassium 3.8 3.5 - 5.1 mmol/L   Chloride 99 98 - 111 mmol/L   CO2 18 (L) 22 - 32 mmol/L   Glucose, Bld 126 (H) 70 - 99 mg/dL   BUN 30 (H) 8 - 23 mg/dL   Creatinine, Ser 1.77 (H) 0.61 - 1.24 mg/dL   Calcium 9.3 8.9 - 10.3 mg/dL   GFR calc non Af Amer 34 (L) >60 mL/min   GFR calc Af Amer 40 (L) >60 mL/min    Comment: (NOTE) The eGFR has  been calculated using the CKD EPI equation. This calculation has not been validated in all clinical situations. eGFR's persistently <60 mL/min signify possible Chronic Kidney Disease.    Anion gap 11 5 - 15    Comment: Performed at [facility], [address] [place], [place] [address]  Urinalysis, Routine w reflex microscopic     Status: Abnormal   Collection Time: 08/19/18 12:05 AM  Result Value Ref Range   Color, Urine RED (A) YELLOW    Comment: BIOCHEMICALS MAY BE AFFECTED BY COLOR   APPearance CLOUDY (A) CLEAR   Specific Gravity, Urine 1.015 1.005 - 1.030   pH 7.5 5.0 - 8.0   Glucose, UA NEGATIVE NEGATIVE mg/dL   Hgb urine dipstick LARGE (A) NEGATIVE   Bilirubin Urine SMALL (A) NEGATIVE   Ketones, ur NEGATIVE NEGATIVE mg/dL   Protein, ur >300 (A) NEGATIVE mg/dL   Nitrite POSITIVE (A) NEGATIVE   Leukocytes, UA LARGE (A) NEGATIVE    Comment: Performed at [facility], [address] [place], [place] [address]  Urinalysis, Microscopic (reflex)     Status: Abnormal   Collection Time: 08/19/18 12:05 AM  Result Value Ref Range   RBC / HPF 21-50 0 - 5 RBC/hpf   WBC, UA >50 0 - 5 WBC/hpf   Bacteria, UA MANY (A) NONE SEEN   Squamous Epithelial / LPF 0-5 0 - 5   Urine-Other LESS THAN 10 mL OF URINE SUBMITTED     Comment: Performed at [facility], [address], [place], [place] [address]  Protime-INR     Status: Abnormal   Collection Time: 08/19/18  8:13 AM  Result Value Ref Range  Prothrombin Time 15.4 (H) 11.4 - 15.2 seconds   INR 1.23     Comment: Performed at [facility], [place] [address], [place], [place] [address]  APTT     Status: None   Collection Time: 08/19/18  8:13 AM  Result Value Ref Range   aPTT 36 24 - 36 seconds    Comment: Performed at [facility], [place] [address], [place], [place] [address]  Basic metabolic panel     Status: Abnormal   Collection Time: 08/19/18  8:13 AM  Result Value  Ref Range   Sodium 134 (L) 135 - 145 mmol/L   Potassium 4.0 3.5 - 5.1 mmol/L   Chloride 105 98 - 111 mmol/L   CO2 17 (L) 22 - 32 mmol/L   Glucose, Bld 136 (H) 70 - 99 mg/dL   BUN 32 (H) 8 - 23 mg/dL   Creatinine, Ser 1.77 (H) 0.61 - 1.24 mg/dL   Calcium 9.3 8.9 - 10.3 mg/dL   GFR calc non Af Amer 34 (L) >60 mL/min   GFR calc Af Amer 40 (L) >60 mL/min    Comment: (NOTE) The eGFR has been calculated using the CKD EPI equation. This calculation has not been validated in all clinical situations. eGFR's persistently <60 mL/min signify possible Chronic Kidney Disease.    Anion gap 12 5 - 15    Comment: Performed at [facility], [place] [address], [place], [place] [address]  Lactic acid, plasma     Status: None   Collection Time: 08/19/18  8:13 AM  Result Value Ref Range   Lactic Acid, Venous 0.8 0.5 - 1.9 mmol/L    Comment: Performed at [facility], [place] [address] [place], [place] [address]  Type and screen [place]     Status: None (Preliminary result)   Collection Time: 08/19/18  8:13 AM  Result Value Ref Range   ABO/RH(D) A POS    Antibody Screen PENDING    Sample Expiration      08/22/2018 Performed at [facility], [place] [address], [place], [place] [address]    No results found for this or any previous visit (from the past 240 hour(s)). Creatinine: Recent Labs    08/12/18 1239 08/18/18 2331 08/19/18 0813  CREATININE 1.61* 1.77* 1.77*    Xrays: See report/chart None  Impression/Assessment:  The patient was uncomfortable in the suprapubic area but in minimal distress.  Condom catheter was in place.   Plan:  I tried to place a 64 [place] and 109 [place] coud unable to do so.  It felt more like a generalized blockage near the neck of the bladder versus a false passage.  I felt that likely the obstruction was due to tumor and I did not want to try to cystoscope beyond it and turned into a more  acute situation.  Because I could palpate the bladder I thought that a suprapubic tube with interventional radiology even under the circumstances was most prudent.  I had also spoke to Dr. [name] concerning who agreed.  I spoke to radiology.  They will proceed accordingly.  I will follow-up  F/up: spoke with IR - right neph tube and s/p placed; spoke with family They have appointment with Dr [name] Tuesday  Shanese Riemenschneider A 08/19/2018, 9:14 AM

## 2018-08-19 NOTE — Progress Notes (Signed)
PROGRESS NOTE    Charles Coffey  WPV:[id number] DOB: June 18, 1935 DOA: 08/18/2018 PCP: [name], MD   Brief Narrative: Charles Coffey is a 83 y.o. male with medical history significant for coronary artery disease status post CABG in 2002, chronic combined systolic & diastolic CHF, chronic kidney disease stage III, chronic anemia and thrombocytopenia, chronic back pain, and urothelial carcinoma of the bladder with bilateral hydronephrosis and percutaneous nephrostomy tubes. She presented with suprapubic and back pain in addition to dislodged right nephrostomy tube and also found to meet sepsis criteria.   Assessment & Plan:   Principal Problem:   Sepsis due to urinary tract infection ([place]) Active Problems:   Essential hypertension   Coronary artery disease of native heart with stable angina pectoris (HCC)   Chronic low back pain   Anemia   Chronic kidney disease, stage 3 (HCC)   Hydronephrosis due to obstructive malignant neoplasm of bladder (HCC)   Urothelial carcinoma of bladder (HCC)   Thrombocytopenia (HCC)   Chronic systolic heart failure (HCC)   Hyponatremia   Sepsis Secondary to UTI in setting of acute urinary retention and dislodged nephrostomy tube. -Urine/blood culture pending -Continue Vanc/Cefepime  Suprapubic/flank pain In setting of urinary retention and UTI. -Urology consult  Chronic combined systolic and diastolic heart failure Hypovolemic on admission. Given IV fluids. Last EF of 25-30%  CKD stage III Baseline of 1.7. Stable.  Anemia Baseline of 8. Elevated in setting of hypovolemia.  Thrombocytopenia Chronic. Stable.  Hyponatremia Transient. In setting of hypovolemia. Resolved.  Chronic pain -Continue Oxycontin and oxycodone -Dilaudid prn for acute severe/refractory pain  CAD -Continue Coreg   DVT prophylaxis: SCDs Code Status:   Code Status: DNR Family Communication: Wife at bedside Disposition Plan: Discharge pending medical  improvement   Consultants:   Urology  Interventional radiology  Procedures:   None  Antimicrobials:  Vancomycin  Cefepime    Subjective: Suprapubic pain  Objective: Vitals:   08/19/18 1255 08/19/18 1300 08/19/18 1400 08/19/18 1500  BP: 101/60 99/61 (!) 94/50 (!) 145/73  Pulse: 67 71 62   Resp: (!) 28 (!) 24 (!) 27 (!) 29  Temp:      TempSrc:      SpO2: 99% 98% 97%   Weight:      Height:        Intake/Output Summary (Last 24 hours) at 08/19/2018 1540 Last data filed at 08/19/2018 1500 Gross per 24 hour  Intake 1684.94 ml  Output 1000 ml  Net 684.94 ml   Filed Weights   08/19/18 0300  Weight: 80.2 kg    Examination:  General exam: Appears calm and comfortable   Data Reviewed: I have personally reviewed following labs and imaging studies  CBC: Recent Labs  Lab 08/18/18 2331 08/19/18 0813  WBC 10.8* 11.3*  NEUTROABS 8.4* 9.8*  HGB 10.7* 10.5*  HCT 30.4* 30.5*  MCV 92.4 92.1  PLT 55* 62*   Basic Metabolic Panel: Recent Labs  Lab 08/18/18 2331 08/19/18 0813  NA 128* 134*  K 3.8 4.0  CL 99 105  CO2 18* 17*  GLUCOSE 126* 136*  BUN 30* 32*  CREATININE 1.77* 1.77*  CALCIUM 9.3 9.3   GFR: Estimated Creatinine Clearance: 31.7 mL/min (A) (by C-G formula based on SCr of 1.77 mg/dL (H)). Liver Function Tests: No results for input(s): AST, ALT, ALKPHOS, BILITOT, PROT, ALBUMIN in the last 168 hours. No results for input(s): LIPASE, AMYLASE in the last 168 hours. No results for input(s): AMMONIA in the  last 168 hours. Coagulation Profile: Recent Labs  Lab 08/19/18 0813  INR 1.23   Cardiac Enzymes: No results for input(s): CKTOTAL, CKMB, CKMBINDEX, TROPONINI in the last 168 hours. BNP (last 3 results) No results for input(s): PROBNP in the last 8760 hours. HbA1C: No results for input(s): HGBA1C in the last 72 hours. CBG: No results for input(s): GLUCAP in the last 168 hours. Lipid Profile: No results for input(s): CHOL, HDL, LDLCALC,  TRIG, CHOLHDL, LDLDIRECT in the last 72 hours. Thyroid Function Tests: No results for input(s): TSH, T4TOTAL, FREET4, T3FREE, THYROIDAB in the last 72 hours. Anemia Panel: No results for input(s): VITAMINB12, FOLATE, FERRITIN, TIBC, IRON, RETICCTPCT in the last 72 hours. Sepsis Labs: Recent Labs  Lab 08/19/18 0813  LATICACIDVEN 0.8    Recent Results (from the past 240 hour(s))  MRSA PCR Screening     Status: Abnormal   Collection Time: 08/19/18  2:57 AM  Result Value Ref Range Status   MRSA by PCR POSITIVE (A) NEGATIVE Final    Comment:        The GeneXpert MRSA Assay (FDA approved for NASAL specimens only), is one component of a comprehensive MRSA colonization surveillance program. It is not intended to diagnose MRSA infection nor to guide or monitor treatment for MRSA infections. RESULT CALLED TO, READ BACK BY AND VERIFIED WITH: Alcide Clever [id number] @ [id number] Performed at [facility] [address] [place], [place] [address]          Radiology Studies: Ir Lenise Arena W Catheter Placement  Result Date: 08/19/2018 INDICATION: Hydronephrosis, accidental removal of the right nephrostomy catheter, urinary retention EXAM: Fluoroscopic right nephrostomy tube replacement through the existing tract Ultrasound and fluoroscopic suprapubic catheter insertion Date:  08/19/2018 08/19/2018 1:39 pm Radiologist:  Jerilynn Mages. Daryll Brod, MD Guidance:  Ultrasound and fluoroscopy COMPARISON:  04/11/2018 FLUOROSCOPY TIME:  Two minutes 36 seconds (68 mGy). COMPLICATIONS: None immediate. MEDICATIONS: 1% lidocaine local ANESTHESIA/SEDATION: A total of Versed 2.0 mg and Fentanyl 100 mcg was administered intravenously. Moderate Sedation Time: 30 minutes. The patient's level of consciousness and vital signs were monitored continuously by radiology nursing throughout the procedure under my direct supervision. CONTRAST:  20 cc PROCEDURE: Informed written consent was  obtained from the patient after a thorough discussion of the procedural risks, benefits and alternatives. All questions were addressed. Maximal Sterile Barrier Technique was utilized including caps, mask, sterile gowns, sterile gloves, sterile drape, hand hygiene and skin antiseptic. A timeout was performed prior to the initiation of the procedure. Right nephrostomy replacement: Under sterile conditions and local anesthesia, the existing right nephrostomy percutaneous tract was cannulated with a 5 French catheter. Contrast injection demonstrates the small patent percutaneous tract. There is also surrounding retroperitoneal extravasation noted. Eventually the glidewire and catheter were advanced into the collecting system and ureter. Contrast injection confirms position within the collecting system. Amplatz guidewire inserted followed by a 10 French nephrostomy with the retention loop formed the renal pelvis. Position confirmed with fluoroscopy and contrast injection. Images obtained for documentation. Catheter secured with a Prolene suture and a sterile dressing. Gravity drainage bag connected. No immediate complication. Patient tolerated the procedure well. Suprapubic catheter insertion: Patient was repositioned supine. Preliminary ultrasound performed. Distended bladder localized in the midline. Images obtained for documentation. Under sterile conditions and local anesthesia, an 18 gauge introducer needle was advanced under direct ultrasound from a midline anterior approach into the bladder. There was return of urine. Amplatz guidewire inserted followed by tract dilatation to  insert a 14 French catheter. Catheter position confirmed within the bladder with ultrasound and fluoroscopy. Images obtained for documentation. Catheter secured with Prolene suture and connected to external gravity drainage bag. Sterile dressing applied. No immediate complication. Patient tolerated the procedure well. IMPRESSION: Successful  right nephrostomy replacement through the existing percutaneous tract. Successful fluoroscopic and ultrasound placement of a new suprapubic catheter (14 [place]). Electronically Signed   By: Jerilynn Mages.  Shick M.D.   On: 08/19/2018 14:15   Ir Nephrostomy Exchange Right  Result Date: 08/19/2018 INDICATION: Hydronephrosis, accidental removal of the right nephrostomy catheter, urinary retention EXAM: Fluoroscopic right nephrostomy tube replacement through the existing tract Ultrasound and fluoroscopic suprapubic catheter insertion Date:  08/19/2018 08/19/2018 1:39 pm Radiologist:  Jerilynn Mages. Daryll Brod, MD Guidance:  Ultrasound and fluoroscopy COMPARISON:  04/11/2018 FLUOROSCOPY TIME:  Two minutes 36 seconds (68 mGy). COMPLICATIONS: None immediate. MEDICATIONS: 1% lidocaine local ANESTHESIA/SEDATION: A total of Versed 2.0 mg and Fentanyl 100 mcg was administered intravenously. Moderate Sedation Time: 30 minutes. The patient's level of consciousness and vital signs were monitored continuously by radiology nursing throughout the procedure under my direct supervision. CONTRAST:  20 cc PROCEDURE: Informed written consent was obtained from the patient after a thorough discussion of the procedural risks, benefits and alternatives. All questions were addressed. Maximal Sterile Barrier Technique was utilized including caps, mask, sterile gowns, sterile gloves, sterile drape, hand hygiene and skin antiseptic. A timeout was performed prior to the initiation of the procedure. Right nephrostomy replacement: Under sterile conditions and local anesthesia, the existing right nephrostomy percutaneous tract was cannulated with a 5 French catheter. Contrast injection demonstrates the small patent percutaneous tract. There is also surrounding retroperitoneal extravasation noted. Eventually the glidewire and catheter were advanced into the collecting system and ureter. Contrast injection confirms position within the collecting system. Amplatz guidewire  inserted followed by a 10 French nephrostomy with the retention loop formed the renal pelvis. Position confirmed with fluoroscopy and contrast injection. Images obtained for documentation. Catheter secured with a Prolene suture and a sterile dressing. Gravity drainage bag connected. No immediate complication. Patient tolerated the procedure well. Suprapubic catheter insertion: Patient was repositioned supine. Preliminary ultrasound performed. Distended bladder localized in the midline. Images obtained for documentation. Under sterile conditions and local anesthesia, an 18 gauge introducer needle was advanced under direct ultrasound from a midline anterior approach into the bladder. There was return of urine. Amplatz guidewire inserted followed by tract dilatation to insert a 14 French catheter. Catheter position confirmed within the bladder with ultrasound and fluoroscopy. Images obtained for documentation. Catheter secured with Prolene suture and connected to external gravity drainage bag. Sterile dressing applied. No immediate complication. Patient tolerated the procedure well. IMPRESSION: Successful right nephrostomy replacement through the existing percutaneous tract. Successful fluoroscopic and ultrasound placement of a new suprapubic catheter (14 [place]). Electronically Signed   By: Jerilynn Mages.  Shick M.D.   On: 08/19/2018 14:15        Scheduled Meds: . carvedilol  3.125 mg Oral BID WC  . [START ON 08/20/2018] Chlorhexidine Gluconate Cloth  6 each Topical Q0600  . DULoxetine  20 mg Oral Daily  . feeding supplement (ENSURE ENLIVE)  237 mL Oral BID BM  . fentaNYL      . lidocaine      . linaclotide  290 mcg Oral Daily  . megestrol  400 mg Oral BID  . midazolam      . mupirocin ointment  1 application Nasal BID  . oxyCODONE  10 mg Oral QHS  .  pramipexole  1.5 mg Oral QPM  . predniSONE  5 mg Oral Q breakfast  . QUEtiapine  25 mg Oral QHS  . sodium chloride flush  3 mL Intravenous Q12H   Continuous  Infusions: . sodium chloride Stopped (08/19/18 0616)  . sodium chloride Stopped (08/19/18 1122)  . sodium chloride 75 mL/hr at 08/19/18 1352  . [START ON 08/20/2018] ceFEPime (MAXIPIME) IV    . famotidine (PEPCID) IV Stopped (08/19/18 0956)  . [START ON 08/20/2018] vancomycin       LOS: 0 days     Cordelia Poche, MD [facility] 08/19/2018, 3:40 PM Pager: [phone]  If 7PM-7AM, please contact night-coverage [email or web address] 08/19/2018, 3:40 PM

## 2018-08-19 NOTE — Care Management Note (Signed)
Case Management Note  Patient Details  Name: Charles Coffey MRN: [id number] Date of Birth: [birth year]  Subjective/Objective:                  1. Sepsis due to UTI; urinary obstruction due to malignancy  - Patient has of urothelial carcinoma of bladder with resulting b/l hydro and bilateral percutaneous nephrostomy tubes, now presenting with right-sided tube out, suprapubic and back pain, and is found to have mild tachycardia and mild leukocytosis  - He is afebrile with stable BP  - UA consistent with infection; he has hx of MRSA, pseudomonas, and Proteus on urine cultures  - Blood and urine cultures collected in ED and empiric Rocephin and vancomycin given  - IR is consulting and much appreciated, planning to replace right-sided nephrostomy tube this am  - Continue empiric antibiotics with cefepime and vancomycin given hx of pseudomonas and MRSA in urine, follow cultures and clinical course    2. Urothelial carcinoma of bladder  - Follows with oncology and currently managed with Keytruda  - Status-post TURBT in 2018   3. Chronic systolic CHF  - Appears hypovolemic on admission in setting of acute infection  - Echo from January 2019 with EF 25-30%, diffuse HK, grade 2 diastolic dysfunction, mild MR, mod-severe LAE  - Hold Lasix initially, give 500 cc over a few hrs, follow daily wt and I/O's, continue Coreg as tolerated    4. CKD III  - SCr is 1.77 on admission, slightly up from most recent prior  - Renally-dose medications, hold Lasix while hypovolemic    5. Anemia; thrombocytopenia  - Scant blood from penis, no significant active bleeding  - Hgb is 10.7 on admission after transfusion of 2 units RBC on 8/20  - Platelets 55,000 secondary to chemotherapy   6. Hyponatremia  - Serum sodium is 128 on admission  - He appears hypovolemic on admission  - Plan to give 500 cc NS over a few hours in light of low EF, hold Lasix, repeat chem panel in am   7. Chronic pain  - In acute  pain on admission; pt reports no relief with multiple doses of IV fentanyl in ED and asks for morphine, stating that this provided relief previously; morphine not ideal in CKD but pt obviously suffering and will be treated with morphine cautiously - Continue long-acting oxycodone and Cymbalta    8. CAD  - No anginal complaints  - Continue beta-blocker as tolerated   Action/Plan: Following for progression-improving slowly Following for cm needs-none present at this time.   Expected Discharge Date:                  Expected Discharge Plan:     In-House Referral:     Discharge planning Services     Post Acute Care Choice:    Choice offered to:     DME Arranged:    DME Agency:     HH Arranged:    HH Agency:     Status of Service:     If discussed at [employer], dates discussed:    Additional Comments:  Leeroy Cha, RN 08/19/2018, 9:14 AM

## 2018-08-19 NOTE — Progress Notes (Signed)
Referring Physician(s): McDiarmid,S/Opyd,T  Supervising Physician: Daryll Brod  Patient Status:  [name] - In-pt  Chief Complaint:  Bladder cancer/bladder outlet obstruction, dislodged right nephrostomy  Subjective: Patient familiar to IR service from a Port-A-Cath placement on 12/27/2017 as well as bilateral nephrostomies in 2018.  He has known history of bladder cancer with bladder outlet obstruction and bilateral hydronephrosis.  He has since undergone multiple nephrostomy exchanges as well as conversion of nephrostomies to nephroureteral catheters and back to nephrostomies.  Latest exam was a left percutaneous nephrostomy replacement on 08/16/2018.  He now presents again with suprapubic and flank discomfort as well as dislodgment of right nephrostomy tube.  Also has low-grade temp elevation and mild leukocytosis, CKD with latest creat 1.77.  There has been inability to place Foley secondary to bladder tumor involvement.  Request now received for replacement of left nephrostomy as well as possible suprapubic catheter placement.  Past Medical History:  Diagnosis Date  . Aortic atherosclerosis ([place])   . BPH with urinary obstruction   . CAD (coronary artery disease)    a.  MI 1995, CABG x 3 2002 (patient says that he had LIMA and RIMA grafts). b. ETT-Cardiolite (10/15) with EF 48%, apical scar, no ischemia. c. Nuc 07/2017 abnormal -> cath was performed,  patent LIMA to LAD and SVG to OM 2. RIMA to RCA is atretic. The right coronary artery is occluded distally with extensive collaterals from the LAD, medical therapy.   . Cancer [facility])    bladder cancer  . Cervical spondylosis without myelopathy 12-20-202017  . Chronic low back pain    sees Dr. Kary Kos   . GERD (gastroesophageal reflux disease)   . Hiatal hernia   . Hyperlipidemia   . Hypertension   . IBS (irritable bowel syndrome)   . Ischemic cardiomyopathy   . Myocardial infarction ([place])   . RBBB   . Restless legs syndrome   .  Statin intolerance   . Syncope    a. in 2015 - no apparent cause, was taking sleep medicine at the time. Cardiac workup unremarkable.  . Tubular adenoma of colon    Past Surgical History:  Procedure Laterality Date  . COLONOSCOPY  10/17/2014   per Dr. Hilarie Fredrickson, tubular adenomas, repeat in 3 yrs   . COLONOSCOPY WITH PROPOFOL N/A 12/01/2017   Procedure: COLONOSCOPY WITH PROPOFOL;  Surgeon: Milus Banister, MD;  Location: [facility] ENDOSCOPY;  Service: Endoscopy;  Laterality: N/A;  . CYSTOSCOPY W/ RETROGRADES Left 11/25/2017   Procedure: CYSTOSCOPY WITH RETROGRADE PYELOGRAM;  Surgeon: Irine Seal, MD;  Location: [facility] ORS;  Service: Urology;  Laterality: Left;  . HEART BYPASS    . IR CONVERT RIGHT NEPHROSTOMY TO NEPHROURETERAL CATH  02/04/2018  . IR EXT NEPHROURETERAL CATH EXCHANGE  04/11/2018  . IR EXT NEPHROURETERAL CATH EXCHANGE  07/04/2018  . IR FLUORO GUIDE CV LINE RIGHT  12/27/2017  . IR NEPHROSTOGRAM LEFT THRU EXISTING ACCESS  12/24/2017  . IR NEPHROSTOMY EXCHANGE LEFT  01/28/2018  . IR NEPHROSTOMY EXCHANGE LEFT  02/04/2018  . IR NEPHROSTOMY EXCHANGE LEFT  02/25/2018  . IR NEPHROSTOMY EXCHANGE LEFT  07/04/2018  . IR NEPHROSTOMY EXCHANGE LEFT  07/05/2018  . IR NEPHROSTOMY EXCHANGE LEFT  08/09/2018  . IR NEPHROSTOMY EXCHANGE LEFT  08/16/2018  . IR NEPHROSTOMY EXCHANGE RIGHT  12/24/2017  . IR NEPHROSTOMY EXCHANGE RIGHT  01/28/2018  . IR NEPHROSTOMY EXCHANGE RIGHT  04/11/2018  . IR NEPHROSTOMY EXCHANGE RIGHT  07/05/2018  . IR NEPHROSTOMY EXCHANGE RIGHT  08/09/2018  .  IR NEPHROSTOMY PLACEMENT LEFT  11/28/2017  . IR NEPHROSTOMY PLACEMENT RIGHT  12/03/2017  . IR PATIENT EVAL TECH 0-60 MINS  03/24/2018  . IR PATIENT EVAL TECH 0-60 MINS  03/28/2018  . IR PATIENT EVAL TECH 0-60 MINS  04/25/2018  . IR PATIENT EVAL TECH 0-60 MINS  06/20/2018  . IR US GUIDE VASC ACCESS RIGHT  12/27/2017  . LAPAROSCOPY N/A 12/01/2017   Procedure: LAPAROSCOPIC DIVERTING OSTOMY;  Surgeon: Stark Klein, MD;  Location: [facility];  Service: General;   Laterality: N/A;  . LEFT HEART CATH AND CORS/GRAFTS ANGIOGRAPHY N/A 08/25/2017   Procedure: LEFT HEART CATH AND CORS/GRAFTS ANGIOGRAPHY;  Surgeon: Wellington Hampshire, MD;  Location: [place] [facility] LAB;  Service: Cardiovascular;  Laterality: N/A;  . LUMBAR FUSION  2003   L3-L4  . PROSTATE SURGERY  06-27-12   per Dr. Roni Bread, had CTT  . TONSILLECTOMY    . TRANSURETHRAL RESECTION OF BLADDER TUMOR N/A 11/25/2017   Procedure: TRANSURETHRAL RESECTION OF BLADDER TUMOR (TURBT);  Surgeon: Irine Seal, MD;  Location: [facility];  Service: Urology;  Laterality: N/A;      Allergies: Statins  Medications: Prior to Admission medications   Medication Sig Start Date End Date Taking? Authorizing Provider  acetaminophen (TYLENOL) 500 MG tablet Take 1,000 mg by mouth every 8 (eight) hours as needed for mild pain or moderate pain.   Yes [provider]  carvedilol (COREG) 3.125 MG tablet Take 1 tablet (3.125 mg total) by mouth 2 (two) times daily. 07/04/18 08/19/18 Yes Donne Hazel, MD  DULoxetine (CYMBALTA) 20 MG capsule Take 1 capsule (20 mg total) by mouth 2 (two) times daily. Take once daily for 2 weeks then increase to twice daily Patient taking differently: Take 20 mg by mouth See admin instructions. Take 20 mg by mouth once daily for 2 weeks then increase to twice daily 08/12/18  Yes Tanner, Lyndon Code., PA-C  feeding supplement (BOOST HIGH PROTEIN) LIQD Take 1 Container by mouth 2 (two) times daily between meals.   Yes [provider]  furosemide (LASIX) 40 MG tablet Take 1 tablet (40 mg total) by mouth 2 (two) times daily. Patient taking differently: Take 40 mg by mouth as needed for fluid or edema.  05/13/18  Yes End, Harrell Gave, MD  hydrOXYzine (ATARAX/VISTARIL) 25 MG tablet Take 1 tablet (25 mg total) by mouth every 4 (four) hours as needed for itching. 07/21/18  Yes [name], MD  lidocaine-prilocaine (EMLA) cream Apply 1 application topically as needed. Patient taking differently: Apply 1  application topically as needed (port access).  12/15/17  Yes Shadad, Mathis Dad, MD  LINZESS 290 MCG CAPS capsule TAKE 1 CAP BY MOUTH ONCE DAILY 30 MIN PRIOR TO A MEAL. MUST HAVE OFFICE VISIT FOR FURTHER REFILLS Patient taking differently: Take 290 mcg by mouth daily.  05/12/18  Yes Pyrtle, Lajuan Lines, MD  megestrol (MEGACE) 40 MG/ML suspension TAKE 10ML BY MOUTH TWICE A DAY Patient taking differently: Take 400 mg by mouth 2 (two) times daily as needed (weight gain).  05/13/18  Yes Wyatt Portela, MD  Melatonin 3 MG CAPS Take 6 mg by mouth at bedtime as needed (sleep).   Yes [provider]  mirabegron ER (MYRBETRIQ) 25 MG TB24 tablet Take 1 tablet (25 mg total) by mouth daily. 02/04/18  Yes Short, Noah Delaine, MD  nitroGLYCERIN (NITROSTAT) 0.4 MG SL tablet Place 1 tablet (0.4 mg total) under the tongue every 5 (five) minutes as needed. 08/19/17 08/19/18 Yes Dunn,  Dayna N, PA-C  oxyCODONE (OXYCONTIN) 10 mg 12 hr tablet Take 1 tablet (10 mg total) by mouth 2 (two) times daily. Patient taking differently: Take 10 mg by mouth at bedtime.  07/22/18  Yes Wyatt Portela, MD  oxyCODONE-acetaminophen (PERCOCET) 10-325 MG tablet Take 1 tablet by mouth every 4 (four) hours as needed for pain. 08/12/18  Yes Tanner, Lyndon Code., PA-C  pembrolizumab [facility]) 100 MG/4ML SOLN Given at infusion center every 3 weeks   Yes [provider]  pramipexole (MIRAPEX) 1 MG tablet TAKE 1 AND 1/2 TABLETS BY MOUTH AT BEDTIME Patient taking differently: Take 1.5 mg by mouth every evening.  06/29/18  Yes [name], MD  predniSONE (DELTASONE) 5 MG tablet Take 1 tablet (5 mg total) by mouth daily with breakfast. 08/12/18  Yes Tanner, Lyndon Code., PA-C  ranitidine (ZANTAC) 300 MG tablet TAKE 1 TABLET BY MOUTH TWICE A DAY Patient taking differently: Take 300 mg by mouth 2 (two) times daily.  07/11/18  Yes Irene Shipper, MD  Tetrahydroz-Glyc-Hyprom-PEG (VISINE MAXIMUM REDNESS RELIEF OP) Place 2 drops into both eyes 2 (two) times daily as  needed (dry eyes).    Yes [provider]  triamcinolone cream (KENALOG) 0.1 % Apply topically 3 (three) times daily. To affected areas 07/04/18  Yes Donne Hazel, MD  guaiFENesin-codeine 100-10 MG/5ML syrup Take 5 mLs by mouth every 6 (six) hours as needed for cough. Patient not taking: Reported on 08/19/2018 06/13/18   Harle Stanford., PA-C  prochlorperazine (COMPAZINE) 10 MG tablet Take 1 tablet (10 mg total) by mouth every 6 (six) hours as needed for nausea or vomiting. Patient not taking: Reported on 08/15/2018 12/15/17   Wyatt Portela, MD  QUEtiapine (SEROQUEL) 25 MG tablet TAKE 1 TABLET BY MOUTH EVERYDAY AT BEDTIME Patient not taking: No sig reported 08/03/18   [name], MD     Vital Signs: BP (!) 146/80   Pulse (!) 101   Temp 97.9 F (36.6 C) (Oral)   Resp (!) 21   Ht 5\' 8"  (1.727 m)   Wt 176 lb 12.9 oz (80.2 kg)   SpO2 98%   BMI 26.88 kg/m   Physical Exam patient lethargic, slightly agitated; chest clear to auscultation bilaterally anteriorly.  Intact right chest wall Port-A-Cath.  Heart with normal rate, occasional ectopy.  Abdomen soft, positive bowel sounds, tender suprapubic region, left colostomy.  Left nephrostomy intact, draining small amount of blood-tinged urine.  No lower extremity edema.  Imaging: Ir Nephrostomy Exchange Left  Result Date: 08/16/2018 INDICATION: Indwelling left-sided percutaneous nephrostomy tube has become completely dislodged and requires replacement. EXAM: REPLACEMENT OF LEFT PERCUTANEOUS NEPHROSTOMY TUBE UNDER FLUOROSCOPY COMPARISON:  08/09/2018 MEDICATIONS: None ANESTHESIA/SEDATION: Fentanyl 100 mcg IV; Versed 3.0 mg IV Moderate Sedation Time:  10 minutes. The patient was continuously monitored during the procedure by the interventional radiology nurse under my direct supervision. CONTRAST:  5 mL Isovue-300-administered into the collecting system(s) FLUOROSCOPY TIME:  Fluoroscopy Time: 54 seconds.  5.2 mGy. COMPLICATIONS: None  immediate. PROCEDURE: Informed written consent was obtained from the patient after a thorough discussion of the procedural risks, benefits and alternatives. All questions were addressed. Maximal Sterile Barrier Technique was utilized including caps, mask, sterile gowns, sterile gloves, sterile drape, hand hygiene and skin antiseptic. A timeout was performed prior to the initiation of the procedure. A 5 French catheter was advanced through the pre-existing left percutaneous nephrostomy tube tract. Diluted contrast was injected to confirm positioning within the renal collecting  system. The catheter was removed over a guidewire. The percutaneous tract was dilated with both 10 [place] and 12 [place] dilators. A new 12 French percutaneous nephrostomy tube was then advanced over the guidewire and formed. The catheter was injected with diluted contrast to confirm position and a fluoroscopic spot image obtained. The catheter was secured at the skin exit site with a Prolene retention suture and attached to a gravity drainage bag. FINDINGS: The percutaneous tract was catheterized to the level of the renal collecting system. The tract had to be dilated in order to allow advancement of a new nephrostomy tube. The new 103 French catheter was formed in the renal pelvis. IMPRESSION: Replacement of dislodged left-sided nephrostomy tube with new 12 French nephrostomy tube formed in the renal pelvis. This catheter was attached to gravity bag drainage. Electronically Signed   By: Aletta Edouard M.D.   On: 08/16/2018 11:05    Labs:  CBC: Recent Labs    07/22/18 1226 08/12/18 1239 08/18/18 2331 08/19/18 0813  WBC 4.9 4.5 10.8* 11.3*  HGB 8.1* 8.1* 10.7* 10.5*  HCT 24.4* 24.0* 30.4* 30.5*  PLT 64* 68* 55* 62*    COAGS: Recent Labs    12/03/17 0550 12/27/17 1227 07/04/18 0346 08/19/18 0813  INR 1.22 1.13 1.22 1.23  APTT  --  33  --  36    BMP: Recent Labs    07/22/18 1226 08/12/18 1239 08/18/18 2331  08/19/18 0813  NA 137 137 128* 134*  K 4.3 4.7 3.8 4.0  CL 113* 108 99 105  CO2 18* 21* 18* 17*  GLUCOSE 92 100* 126* 136*  BUN 28* 23 30* 32*  CALCIUM 9.6 9.7 9.3 9.3  CREATININE 1.70* 1.61* 1.77* 1.77*  GFRNONAA 36* 38* 34* 34*  GFRAA 42* 45* 40* 40*    LIVER FUNCTION TESTS: Recent Labs    06/30/18 2009 07/01/18 0448 07/22/18 1226 08/12/18 1239  BILITOT 0.4 0.9 0.3 0.4  AST 15 16 16  14*  ALT 15 13 12 12   ALKPHOS 48 42 57 61  PROT 6.6 6.0* 7.1 7.2  ALBUMIN 3.3* 3.0* 3.9 3.8    Assessment and Plan: Pt with known history of bladder cancer with bladder outlet obstruction and bilateral hydronephrosis.  He has since undergone multiple nephrostomy exchanges as well as conversion of nephrostomies to nephroureteral catheters and back to nephrostomies.  Latest exam was a left percutaneous nephrostomy replacement on 08/16/2018.  He now presents again with suprapubic and flank discomfort as well as dislodgment of right nephrostomy tube.  Also has low-grade temp elevation and mild leukocytosis, CKD with latest creat 1.77.  There has been inability to place Foley secondary to bladder tumor involvement.  Request now received for replacement of left nephrostomy as well as possible suprapubic catheter placement.  Case discussed with Dr. Annamaria Boots.  Plan at this time is to replace right nephrostomy and scan bladder prior to potential suprapubic catheter placement.  Details/risks of above, including but not limited to, internal bleeding, infection discussed with patient spouse with her understanding consent.  Procedure scheduled for today.   Electronically Signed: D. Rowe Robert, PA-C 08/19/2018, 10:31 AM   I spent a total of 25 minutes at the the patient's bedside AND on the patient's hospital floor or unit, greater than 50% of which was counseling/coordinating care for nephrostograms, right nephrostomy replacement, possible suprapubic catheter placement    Patient ID: Charles Coffey, male    DOB: 03-Oct-1935, 83 y.o.   MRN: [id number]

## 2018-08-19 NOTE — H&P (Signed)
History and Physical    Charles Coffey LTJ:[id number] DOB: 1935-12-07 DOA: 08/18/2018  PCP: [name], MD   Patient coming from: Home, by way of [facility]   Chief Complaint: Suprapubic and back pain, right-sided nephrostomy tube is out   HPI: Charles Coffey is a 83 y.o. male with medical history significant for coronary artery disease status post CABG in 2002, chronic combined systolic & diastolic CHF, chronic kidney disease stage III, chronic anemia and thrombocytopenia, chronic back pain, and urothelial carcinoma of the bladder with bilateral hydronephrosis and percutaneous nephrostomy tubes, now presenting to the emergency department with severe suprapubic and back pain, reporting that the right-sided tube had fallen out.  Patient has developed progressive pain in the suprapubic region and back over the past 2 days, becoming severe despite his use of Percocet and long-acting oxycodone at home.  Wife reports that his temperature was 100.8 F last night.  They report that the right-sided tube had come out.  Patient denies any chest pain, shortness breath, headache, change in vision or hearing, or focal numbness or weakness.    ED Course: Upon arrival to the ED, patient is found to be afebrile, saturating well on room air, tachycardic in the 110s, and with stable blood pressure.  Chemistry panel is notable for sodium of 128, bicarbonate 18, and creatinine 1.77, up from 1.61 a week earlier.  CBC features a leukocytosis to 10,800, improved normocytic anemia with hemoglobin 10.7, and a chronic thrombocytopenia with platelets 55,000.  Urinalysis is consistent with infection.  Blood and urine cultures were collected and the patient was treated with Rocephin and vancomycin in the ED.  He was given multiple doses of fentanyl without relief of his pain.  Foley and coud catheters could not be passed in the ED, urology was consulted and recommended avoiding urethral catheterization given his cancer, and  advise calling IR for replacement of the nephrostomy tube.  IR was consulted and agrees to replace the tube.  Patient remains slightly tachycardic with stable blood pressure and no fever.  He will be admitted for ongoing evaluation and management.  Review of Systems:  All other systems reviewed and apart from HPI, are negative.  Past Medical History:  Diagnosis Date  . Aortic atherosclerosis ([place])   . BPH with urinary obstruction   . CAD (coronary artery disease)    a.  MI 1995, CABG x 3 2002 (patient says that he had LIMA and RIMA grafts). b. ETT-Cardiolite (10/15) with EF 48%, apical scar, no ischemia. c. Nuc 07/2017 abnormal -> cath was performed,  patent LIMA to LAD and SVG to OM 2. RIMA to RCA is atretic. The right coronary artery is occluded distally with extensive collaterals from the LAD, medical therapy.   . Cancer [facility])    bladder cancer  . Cervical spondylosis without myelopathy 2020/03/2416  . Chronic low back pain    sees Dr. Kary Kos   . GERD (gastroesophageal reflux disease)   . Hiatal hernia   . Hyperlipidemia   . Hypertension   . IBS (irritable bowel syndrome)   . Ischemic cardiomyopathy   . Myocardial infarction ([place])   . RBBB   . Restless legs syndrome   . Statin intolerance   . Syncope    a. in 2015 - no apparent cause, was taking sleep medicine at the time. Cardiac workup unremarkable.  . Tubular adenoma of colon     Past Surgical History:  Procedure Laterality Date  . COLONOSCOPY  10/17/2014  per Dr. Hilarie Fredrickson, tubular adenomas, repeat in 3 yrs   . COLONOSCOPY WITH PROPOFOL N/A 12/01/2017   Procedure: COLONOSCOPY WITH PROPOFOL;  Surgeon: Milus Banister, MD;  Location: [facility];  Service: Endoscopy;  Laterality: N/A;  . CYSTOSCOPY W/ RETROGRADES Left 11/25/2017   Procedure: CYSTOSCOPY WITH RETROGRADE PYELOGRAM;  Surgeon: Irine Seal, MD;  Location: [facility];  Service: Urology;  Laterality: Left;  . HEART BYPASS    . IR CONVERT RIGHT NEPHROSTOMY TO  NEPHROURETERAL CATH  02/04/2018  . IR EXT NEPHROURETERAL CATH EXCHANGE  04/11/2018  . IR EXT NEPHROURETERAL CATH EXCHANGE  07/04/2018  . IR FLUORO GUIDE CV LINE RIGHT  12/27/2017  . IR NEPHROSTOGRAM LEFT THRU EXISTING ACCESS  12/24/2017  . IR NEPHROSTOMY EXCHANGE LEFT  01/28/2018  . IR NEPHROSTOMY EXCHANGE LEFT  02/04/2018  . IR NEPHROSTOMY EXCHANGE LEFT  02/25/2018  . IR NEPHROSTOMY EXCHANGE LEFT  07/04/2018  . IR NEPHROSTOMY EXCHANGE LEFT  07/05/2018  . IR NEPHROSTOMY EXCHANGE LEFT  08/09/2018  . IR NEPHROSTOMY EXCHANGE LEFT  08/16/2018  . IR NEPHROSTOMY EXCHANGE RIGHT  12/24/2017  . IR NEPHROSTOMY EXCHANGE RIGHT  01/28/2018  . IR NEPHROSTOMY EXCHANGE RIGHT  04/11/2018  . IR NEPHROSTOMY EXCHANGE RIGHT  07/05/2018  . IR NEPHROSTOMY EXCHANGE RIGHT  08/09/2018  . IR NEPHROSTOMY PLACEMENT LEFT  11/28/2017  . IR NEPHROSTOMY PLACEMENT RIGHT  12/03/2017  . IR PATIENT EVAL TECH 0-60 MINS  03/24/2018  . IR PATIENT EVAL TECH 0-60 MINS  03/28/2018  . IR PATIENT EVAL TECH 0-60 MINS  04/25/2018  . IR PATIENT EVAL TECH 0-60 MINS  06/20/2018  . IR US GUIDE VASC ACCESS RIGHT  12/27/2017  . LAPAROSCOPY N/A 12/01/2017   Procedure: LAPAROSCOPIC DIVERTING OSTOMY;  Surgeon: Stark Klein, MD;  Location: [facility];  Service: General;  Laterality: N/A;  . LEFT HEART CATH AND CORS/GRAFTS ANGIOGRAPHY N/A 08/25/2017   Procedure: LEFT HEART CATH AND CORS/GRAFTS ANGIOGRAPHY;  Surgeon: Wellington Hampshire, MD;  Location: [place] [facility];  Service: Cardiovascular;  Laterality: N/A;  . LUMBAR FUSION  2003   L3-L4  . PROSTATE SURGERY  06-27-12   per Dr. Roni Bread, had CTT  . TONSILLECTOMY    . TRANSURETHRAL RESECTION OF BLADDER TUMOR N/A 11/25/2017   Procedure: TRANSURETHRAL RESECTION OF BLADDER TUMOR (TURBT);  Surgeon: Irine Seal, MD;  Location: [facility];  Service: Urology;  Laterality: N/A;     reports that he quit smoking about 39 years ago. His smoking use included cigarettes. He has never used smokeless tobacco. He reports that he drank about  2.0 standard drinks of alcohol per week. He reports that he does not use drugs.  Allergies  Allergen Reactions  . Statins Other (See Comments)    liver effects    Family History  Problem Relation Age of Onset  . Heart attack Mother   . Aneurysm Father        femoral artery  . Heart disease Brother   . Heart disease Maternal Uncle        x 2  . Colon cancer Neg Hx   . Esophageal cancer Neg Hx   . Pancreatic cancer Neg Hx   . Kidney disease Neg Hx   . Liver disease Neg Hx      Prior to Admission medications   Medication Sig Start Date End Date Taking? Authorizing Provider  acetaminophen (TYLENOL) 500 MG tablet Take 1,000 mg by mouth every 8 (eight) hours as needed for mild pain or moderate pain.  [provider]  carvedilol (COREG) 3.125 MG tablet Take 1 tablet (3.125 mg total) by mouth 2 (two) times daily. Patient not taking: Reported on 08/15/2018 07/04/18 08/15/18  Donne Hazel, MD  carvedilol (COREG) 6.25 MG tablet Take 3.125 mg by mouth 2 (two) times daily with a meal.    [provider]  DULoxetine (CYMBALTA) 20 MG capsule Take 1 capsule (20 mg total) by mouth 2 (two) times daily. Take once daily for 2 weeks then increase to twice daily Patient taking differently: Take 20 mg by mouth See admin instructions. Take 20 mg by mouth once daily for 2 weeks then increase to twice daily 08/12/18   Sandi Mealy E., PA-C  feeding supplement (BOOST HIGH PROTEIN) LIQD Take 1 Container by mouth 2 (two) times daily between meals.    [provider]  furosemide (LASIX) 40 MG tablet Take 1 tablet (40 mg total) by mouth 2 (two) times daily. Patient taking differently: Take 40 mg by mouth as needed for fluid or edema.  05/13/18   End, Harrell Gave, MD  guaiFENesin-codeine 100-10 MG/5ML syrup Take 5 mLs by mouth every 6 (six) hours as needed for cough. 06/13/18   Tanner, Lyndon Code., PA-C  hydrOXYzine (ATARAX/VISTARIL) 25 MG tablet Take 1 tablet (25 mg total) by mouth every 4  (four) hours as needed for itching. 07/21/18   [name], MD  lidocaine-prilocaine (EMLA) cream Apply 1 application topically as needed. Patient taking differently: Apply 1 application topically as needed (port access).  12/15/17   Wyatt Portela, MD  LINZESS 290 MCG CAPS capsule TAKE 1 CAP BY MOUTH ONCE DAILY 30 MIN PRIOR TO A MEAL. MUST HAVE OFFICE VISIT FOR FURTHER REFILLS Patient taking differently: Take 290 mcg by mouth daily.  05/12/18   Pyrtle, Lajuan Lines, MD  megestrol (MEGACE) 40 MG/ML suspension TAKE 10ML BY MOUTH TWICE A DAY Patient taking differently: Take 400 mg by mouth 2 (two) times daily as needed (weight gain).  05/13/18   Wyatt Portela, MD  Melatonin 3 MG CAPS Take 6 mg by mouth at bedtime as needed (sleep).    [provider]  mirabegron ER (MYRBETRIQ) 25 MG TB24 tablet Take 1 tablet (25 mg total) by mouth daily. 02/04/18   Janece Canterbury, MD  nitroGLYCERIN (NITROSTAT) 0.4 MG SL tablet Place 1 tablet (0.4 mg total) under the tongue every 5 (five) minutes as needed. 08/19/17 06/30/18  Dunn, Nedra Hai, PA-C  oxyCODONE (OXYCONTIN) 10 mg 12 hr tablet Take 1 tablet (10 mg total) by mouth 2 (two) times daily. Patient taking differently: Take 10 mg by mouth at bedtime.  07/22/18   Wyatt Portela, MD  oxyCODONE-acetaminophen (PERCOCET) 10-325 MG tablet Take 1 tablet by mouth every 4 (four) hours as needed for pain. 08/12/18   Tanner, Lyndon Code., PA-C  pembrolizumab [facility]) 100 MG/4ML SOLN Given at infusion center every 3 weeks    [provider]  pramipexole (MIRAPEX) 1 MG tablet TAKE 1 AND 1/2 TABLETS BY MOUTH AT BEDTIME Patient taking differently: Take 1.5 mg by mouth every evening.  06/29/18   [name], MD  predniSONE (DELTASONE) 5 MG tablet Take 1 tablet (5 mg total) by mouth daily with breakfast. 08/12/18   Tanner, Lyndon Code., PA-C  prochlorperazine (COMPAZINE) 10 MG tablet Take 1 tablet (10 mg total) by mouth every 6 (six) hours as needed for nausea or vomiting. Patient  not taking: Reported on 08/15/2018 12/15/17   Wyatt Portela, MD  QUEtiapine (  SEROQUEL) 25 MG tablet TAKE 1 TABLET BY MOUTH EVERYDAY AT BEDTIME Patient taking differently: Take 25 mg by mouth at bedtime.  08/03/18   [name], MD  ranitidine (ZANTAC) 300 MG tablet TAKE 1 TABLET BY MOUTH TWICE A DAY Patient taking differently: Take 300 mg by mouth 2 (two) times daily.  07/11/18   Irene Shipper, MD  Tetrahydroz-Glyc-Hyprom-PEG (VISINE MAXIMUM REDNESS RELIEF OP) Place 2 drops into both eyes 2 (two) times daily as needed (dry eyes).     [provider]  triamcinolone cream (KENALOG) 0.1 % Apply topically 3 (three) times daily. To affected areas Patient not taking: Reported on 08/15/2018 07/04/18   Donne Hazel, MD    Physical Exam: Vitals:   08/19/18 0153 08/19/18 0155 08/19/18 0200 08/19/18 0300  BP: 129/77 129/77 135/72   Pulse: (!) 105 (!) 105 99 (!) 107  Resp: 18 16 12 11   Temp:  98.8 F (37.1 C)    TempSrc:  Oral    SpO2: 97% 97% 97% 98%  Height:    5\' 8"  (1.727 m)      Constitutional: NAD, appears uncomfortable.  Eyes: PERTLA, lids and conjunctivae normal ENMT: Mucous membranes are moist. Posterior pharynx clear of any exudate or lesions.   Neck: normal, supple, no masses, no thyromegaly Respiratory: clear to auscultation bilaterally, no wheezing, no crackles. Normal respiratory effort.    Cardiovascular: Rate ~110 and regular. No extremity edema. No significant JVD. Abdomen: No distension, soft, suprapubic tenderness, no rebound pain or guarding. Bowel sounds active.  Musculoskeletal: no clubbing / cyanosis. No joint deformity upper and lower extremities.   Skin: no significant rashes, lesions, ulcers. Warm, dry, well-perfused. Neurologic: CN 2-12 grossly intact. Sensation intact. Strength 5/5 in all 4 limbs.  Psychiatric: Alert and oriented to person, place, and situation. Anxious, cooperative.     Labs on Admission: I have personally reviewed following labs and  imaging studies  CBC: Recent Labs  Lab 08/12/18 1239 08/18/18 2331  WBC 4.5 10.8*  NEUTROABS 2.2 8.4*  HGB 8.1* 10.7*  HCT 24.0* 30.4*  MCV 97.7 92.4  PLT 68* 55*   Basic Metabolic Panel: Recent Labs  Lab 08/12/18 1239 08/18/18 2331  NA 137 128*  K 4.7 3.8  CL 108 99  CO2 21* 18*  GLUCOSE 100* 126*  BUN 23 30*  CREATININE 1.61* 1.77*  CALCIUM 9.7 9.3   GFR: Estimated Creatinine Clearance: 31.7 mL/min (A) (by C-G formula based on SCr of 1.77 mg/dL (H)). Liver Function Tests: Recent Labs  Lab 08/12/18 1239  AST 14*  ALT 12  ALKPHOS 61  BILITOT 0.4  PROT 7.2  ALBUMIN 3.8   No results for input(s): LIPASE, AMYLASE in the last 168 hours. No results for input(s): AMMONIA in the last 168 hours. Coagulation Profile: No results for input(s): INR, PROTIME in the last 168 hours. Cardiac Enzymes: No results for input(s): CKTOTAL, CKMB, CKMBINDEX, TROPONINI in the last 168 hours. BNP (last 3 results) No results for input(s): PROBNP in the last 8760 hours. HbA1C: No results for input(s): HGBA1C in the last 72 hours. CBG: No results for input(s): GLUCAP in the last 168 hours. Lipid Profile: No results for input(s): CHOL, HDL, LDLCALC, TRIG, CHOLHDL, LDLDIRECT in the last 72 hours. Thyroid Function Tests: No results for input(s): TSH, T4TOTAL, FREET4, T3FREE, THYROIDAB in the last 72 hours. Anemia Panel: No results for input(s): VITAMINB12, FOLATE, FERRITIN, TIBC, IRON, RETICCTPCT in the last 72 hours. Urine analysis:    Component  Value Date/Time   COLORURINE RED (A) 08/19/2018 0005   APPEARANCEUR CLOUDY (A) 08/19/2018 0005   LABSPEC 1.015 08/19/2018 0005   PHURINE 7.5 08/19/2018 0005   GLUCOSEU NEGATIVE 08/19/2018 0005   HGBUR LARGE (A) 08/19/2018 0005   HGBUR negative 10/15/2010 1251   BILIRUBINUR SMALL (A) 08/19/2018 0005   BILIRUBINUR n 12/01/2016 1521   KETONESUR NEGATIVE 08/19/2018 0005   PROTEINUR >300 (A) 08/19/2018 0005   UROBILINOGEN 0.2 12/01/2016  1521   UROBILINOGEN 0.2 10/15/2010 1251   NITRITE POSITIVE (A) 08/19/2018 0005   LEUKOCYTESUR LARGE (A) 08/19/2018 0005   Sepsis Labs: @LABRCNTIP (procalcitonin:4,lacticidven:4) )No results found for this or any previous visit (from the past 240 hour(s)).   Radiological Exams on Admission: No results found.  EKG: Not performed.    Assessment/Plan   1. Sepsis due to UTI; urinary obstruction due to malignancy  - Patient has of urothelial carcinoma of bladder with resulting b/l hydro and bilateral percutaneous nephrostomy tubes, now presenting with right-sided tube out, suprapubic and back pain, and is found to have mild tachycardia and mild leukocytosis  - He is afebrile with stable BP  - UA consistent with infection; he has hx of MRSA, pseudomonas, and Proteus on urine cultures  - Blood and urine cultures collected in ED and empiric Rocephin and vancomycin given  - IR is consulting and much appreciated, planning to replace right-sided nephrostomy tube this am  - Continue empiric antibiotics with cefepime and vancomycin given hx of pseudomonas and MRSA in urine, follow cultures and clinical course    2. Urothelial carcinoma of bladder  - Follows with oncology and currently managed with Keytruda  - Status-post TURBT in 2018   3. Chronic systolic CHF  - Appears hypovolemic on admission in setting of acute infection  - Echo from January 2019 with EF 25-30%, diffuse HK, grade 2 diastolic dysfunction, mild MR, mod-severe LAE  - Hold Lasix initially, give 500 cc over a few hrs, follow daily wt and I/O's, continue Coreg as tolerated    4. CKD III  - SCr is 1.77 on admission, slightly up from most recent prior  - Renally-dose medications, hold Lasix while hypovolemic    5. Anemia; thrombocytopenia  - Scant blood from penis, no significant active bleeding  - Hgb is 10.7 on admission after transfusion of 2 units RBC on 8/20  - Platelets 55,000 secondary to chemotherapy   6. Hyponatremia    - Serum sodium is 128 on admission  - He appears hypovolemic on admission  - Plan to give 500 cc NS over a few hours in light of low EF, hold Lasix, repeat chem panel in am   7. Chronic pain  - In acute pain on admission; pt reports no relief with multiple doses of IV fentanyl in ED and asks for morphine, stating that this provided relief previously; morphine not ideal in CKD but pt obviously suffering and will be treated with morphine cautiously - Continue long-acting oxycodone and Cymbalta    8. CAD  - No anginal complaints  - Continue beta-blocker as tolerated    DVT prophylaxis: SCD's   Code Status: DNR  Family Communication: wife updated at bedside  Consults called: IR  Admission status: Inpatient     Vianne Bulls, MD [facility] Pager [phone]  If 7PM-7AM, please contact night-coverage [email or web address] Password [facility]  08/19/2018, 3:41 AM

## 2018-08-19 NOTE — Progress Notes (Signed)
Pharmacy Antibiotic Note  Charles Coffey is a 83 y.o. male admitted on 08/18/2018 with UTI.  Pharmacy has been consulted for cefepime and vancomycin dosing.  Plan: Cefepime 1 Gm x1 then 2 gm IV q12h Vancomycin 1500 mg x1 then 750 mg IV q24h for est AUC = 486 Goal AUC = 400-500 F/u scr/cultures  Height: 5\' 8"  (172.7 cm) IBW/kg (Calculated) : 68.4  Temp (24hrs), Avg:98.5 F (36.9 C), Min:98.2 F (36.8 C), Max:98.8 F (37.1 C)  Recent Labs  Lab 08/12/18 1239 08/18/18 2331  WBC 4.5 10.8*  CREATININE 1.61* 1.77*    Estimated Creatinine Clearance: 31.7 mL/min (A) (by C-G formula based on SCr of 1.77 mg/dL (H)).    Allergies  Allergen Reactions  . Antihistamines, Loratadine-Type Other (See Comments)    Unable to urinate  . Statins Other (See Comments)    liver effects    Antimicrobials this admission: 8/23 cefepime >>  8/23 vancomycin >>  Dose adjustments this admission:   Microbiology results:  BCx:   UCx:    Sputum:    MRSA PCR:   Thank you for allowing pharmacy to be a part of this patient's care.  Dorrene German 08/19/2018 3:35 AM

## 2018-08-19 NOTE — ED Notes (Signed)
Rocephin was not initially unclamped; then paused for blood culture x 2 drawn and then restarted. Patient tolerated well.

## 2018-08-19 NOTE — ED Notes (Signed)
Brief noted to be wet with yellow tinged urine; brief removed and clean brief applied.

## 2018-08-20 LAB — CBC
HEMATOCRIT: 28.6 % — AB (ref 39.0–52.0)
HEMOGLOBIN: 9.8 g/dL — AB (ref 13.0–17.0)
MCH: 32.3 pg (ref 26.0–34.0)
MCHC: 34.3 g/dL (ref 30.0–36.0)
MCV: 94.4 fL (ref 78.0–100.0)
Platelets: 47 10*3/uL — ABNORMAL LOW (ref 150–400)
RBC: 3.03 MIL/uL — AB (ref 4.22–5.81)
RDW: 17.7 % — ABNORMAL HIGH (ref 11.5–15.5)
WBC: 8.2 10*3/uL (ref 4.0–10.5)

## 2018-08-20 LAB — BASIC METABOLIC PANEL
ANION GAP: 9 (ref 5–15)
BUN: 29 mg/dL — ABNORMAL HIGH (ref 8–23)
CO2: 19 mmol/L — AB (ref 22–32)
Calcium: 9 mg/dL (ref 8.9–10.3)
Chloride: 109 mmol/L (ref 98–111)
Creatinine, Ser: 1.65 mg/dL — ABNORMAL HIGH (ref 0.61–1.24)
GFR calc Af Amer: 43 mL/min — ABNORMAL LOW (ref >60)
GFR calc non Af Amer: 37 mL/min — ABNORMAL LOW (ref >60)
Glucose, Bld: 117 mg/dL — ABNORMAL HIGH (ref 70–99)
POTASSIUM: 4.2 mmol/L (ref 3.5–5.1)
Sodium: 137 mmol/L (ref 135–145)

## 2018-08-20 NOTE — Progress Notes (Signed)
Subjective: Patient reports no complaints. His wife said he is improving but still a little bit delirious.  Objective: Vital signs in last 24 hours: Temp:  [98.1 F (36.7 C)-102.1 F (38.9 C)] 98.9 F (37.2 C) (08/24 1200) Pulse Rate:  [67-91] 88 (08/24 1303) Resp:  [22-39] 34 (08/24 1303) BP: (91-132)/(46-71) 116/71 (08/24 1303) SpO2:  [94 %-100 %] 97 % (08/24 1303) Weight:  [79.8 kg] 79.8 kg (08/24 0457)  Intake/Output from previous day: 08/23 0701 - 08/24 0700 In: 1697.8 [I.V.:1497.8; IV Piggyback:200] Out: 1900 [Urine:1900] Intake/Output this shift: Total I/O In: 100 [IV Piggyback:100] Out: 300 [Urine:300]  Physical Exam:  He is in no acute distress Alert and oriented, no focal deficits Abdomen-soft and nontender. SP tube in place and draining clear urine. Ostomy in place and stool  In the bag. No CVA tenderness. Right nephrostomy tube with clear drainage, left nephrostomy tube with clear drainage but less (left kidney smaller on CT)   Lab Results: Recent Labs    08/18/18 2331 08/19/18 0813 08/20/18 0335  HGB 10.7* 10.5* 9.8*  HCT 30.4* 30.5* 28.6*   BMET Recent Labs    08/19/18 0813 08/20/18 0335  NA 134* 137  K 4.0 4.2  CL 105 109  CO2 17* 19*  GLUCOSE 136* 117*  BUN 32* 29*  CREATININE 1.77* 1.65*  CALCIUM 9.3 9.0   Recent Labs    08/19/18 0813  INR 1.23   No results for input(s): LABURIN in the last 72 hours. Results for orders placed or performed during the hospital encounter of 08/18/18  Urine culture     Status: Abnormal (Preliminary result)   Collection Time: 08/19/18 12:05 AM  Result Value Ref Range Status   Specimen Description   Final    URINE, RANDOM NEPHROSTOMY BAG LEFT KIDNEY Performed at [facility], [address] [place], [place] [address]    Special Requests   Final    NONE Performed at [facility], [address] [place], [place] [address]    Culture (A)  Final    >=100,000 COLONIES/mL  STAPHYLOCOCCUS AUREUS SUSCEPTIBILITIES TO FOLLOW Performed at [facility], [place] [address] [place], [place] [address]    Report Status PENDING  Incomplete  Blood culture (routine x 2)     Status: None (Preliminary result)   Collection Time: 08/19/18 12:50 AM  Result Value Ref Range Status   Specimen Description   Final    BLOOD LEFT FOREARM Performed at [facility], [address] [place], [place] [address]    Special Requests   Final    BOTTLES DRAWN AEROBIC AND ANAEROBIC Blood Culture adequate volume Performed at [facility], [address], [place], [place] [address]    Culture   Final    NO GROWTH 1 DAY Performed at [facility], [place] [address] [place], [place] [address]    Report Status PENDING  Incomplete  Blood culture (routine x 2)     Status: None (Preliminary result)   Collection Time: 08/19/18 12:50 AM  Result Value Ref Range Status   Specimen Description   Final    BLOOD RIGHT FOREARM Performed at [facility], [address] [place], [place] [address]    Special Requests   Final    BOTTLES DRAWN AEROBIC AND ANAEROBIC Blood Culture adequate volume Performed at [facility], [address] [place], [place] [address]  Culture   Final    NO GROWTH 1 DAY Performed at [facility], [place] [address] [place], [place] [address]    Report Status PENDING  Incomplete  MRSA PCR Screening     Status: Abnormal   Collection Time: 08/19/18  2:57 AM  Result Value Ref Range Status   MRSA by PCR POSITIVE (A) NEGATIVE Final    Comment:        The GeneXpert MRSA Assay (FDA approved for NASAL specimens only), is one component of a comprehensive MRSA colonization surveillance program. It is not intended to diagnose MRSA infection nor to guide or monitor treatment for MRSA infections. RESULT CALLED TO, READ BACK BY AND VERIFIED WITH: Alcide Clever [id number] @ [address] Performed at [facility] [address] [place], [place] [address]     Studies/Results: Ir Cyndy Freeze Guide Ndl Plmt / Bx  Result Date: 08/19/2018 INDICATION: Hydronephrosis, accidental removal of the right nephrostomy catheter, urinary retention EXAM: Fluoroscopic right nephrostomy tube replacement through the existing tract Ultrasound and fluoroscopic suprapubic catheter insertion Date:  08/19/2018 08/19/2018 1:39 pm Radiologist:  Jerilynn Mages. Daryll Brod, MD Guidance:  Ultrasound and fluoroscopy COMPARISON:  04/11/2018 FLUOROSCOPY TIME:  Two minutes 36 seconds (68 mGy). COMPLICATIONS: None immediate. MEDICATIONS: 1% lidocaine local ANESTHESIA/SEDATION: A total of Versed 2.0 mg and Fentanyl 100 mcg was administered intravenously. Moderate Sedation Time: 30 minutes. The patient's level of consciousness and vital signs were monitored continuously by radiology nursing throughout the procedure under my direct supervision. CONTRAST:  20 cc PROCEDURE: Informed written consent was obtained from the patient after a thorough discussion of the procedural risks, benefits and alternatives. All questions were addressed. Maximal Sterile Barrier Technique was utilized including caps, mask, sterile gowns, sterile gloves, sterile drape, hand hygiene and skin antiseptic. A timeout was performed prior to the initiation of the procedure. Right nephrostomy replacement: Under sterile conditions and local anesthesia, the existing right nephrostomy percutaneous tract was cannulated with a 5 French catheter. Contrast injection demonstrates the small patent percutaneous tract. There is also surrounding retroperitoneal extravasation noted. Eventually the glidewire and catheter were advanced into the collecting system and ureter. Contrast injection confirms position within the collecting system. Amplatz guidewire inserted followed by a 10 French nephrostomy with the retention loop formed the renal pelvis. Position confirmed with fluoroscopy and  contrast injection. Images obtained for documentation. Catheter secured with a Prolene suture and a sterile dressing. Gravity drainage bag connected. No immediate complication. Patient tolerated the procedure well. Suprapubic catheter insertion: Patient was repositioned supine. Preliminary ultrasound performed. Distended bladder localized in the midline. Images obtained for documentation. Under sterile conditions and local anesthesia, an 18 gauge introducer needle was advanced under direct ultrasound from a midline anterior approach into the bladder. There was return of urine. Amplatz guidewire inserted followed by tract dilatation to insert a 14 French catheter. Catheter position confirmed within the bladder with ultrasound and fluoroscopy. Images obtained for documentation. Catheter secured with Prolene suture and connected to external gravity drainage bag. Sterile dressing applied. No immediate complication. Patient tolerated the procedure well. IMPRESSION: Successful right nephrostomy replacement through the existing percutaneous tract. Successful fluoroscopic and ultrasound placement of a new suprapubic catheter (14 [place]). Electronically Signed   By: Jerilynn Mages.  Shick M.D.   On: 08/19/2018 14:15   Ir Nephrostomy Exchange Right  Result Date: 08/19/2018 INDICATION: Hydronephrosis, accidental removal of the right nephrostomy catheter, urinary retention EXAM: Fluoroscopic right nephrostomy tube replacement through the existing tract Ultrasound and fluoroscopic  suprapubic catheter insertion Date:  08/19/2018 08/19/2018 1:39 pm Radiologist:  M. Daryll Brod, MD Guidance:  Ultrasound and fluoroscopy COMPARISON:  04/11/2018 FLUOROSCOPY TIME:  Two minutes 36 seconds (68 mGy). COMPLICATIONS: None immediate. MEDICATIONS: 1% lidocaine local ANESTHESIA/SEDATION: A total of Versed 2.0 mg and Fentanyl 100 mcg was administered intravenously. Moderate Sedation Time: 30 minutes. The patient's level of consciousness and vital signs  were monitored continuously by radiology nursing throughout the procedure under my direct supervision. CONTRAST:  20 cc PROCEDURE: Informed written consent was obtained from the patient after a thorough discussion of the procedural risks, benefits and alternatives. All questions were addressed. Maximal Sterile Barrier Technique was utilized including caps, mask, sterile gowns, sterile gloves, sterile drape, hand hygiene and skin antiseptic. A timeout was performed prior to the initiation of the procedure. Right nephrostomy replacement: Under sterile conditions and local anesthesia, the existing right nephrostomy percutaneous tract was cannulated with a 5 French catheter. Contrast injection demonstrates the small patent percutaneous tract. There is also surrounding retroperitoneal extravasation noted. Eventually the glidewire and catheter were advanced into the collecting system and ureter. Contrast injection confirms position within the collecting system. Amplatz guidewire inserted followed by a 10 French nephrostomy with the retention loop formed the renal pelvis. Position confirmed with fluoroscopy and contrast injection. Images obtained for documentation. Catheter secured with a Prolene suture and a sterile dressing. Gravity drainage bag connected. No immediate complication. Patient tolerated the procedure well. Suprapubic catheter insertion: Patient was repositioned supine. Preliminary ultrasound performed. Distended bladder localized in the midline. Images obtained for documentation. Under sterile conditions and local anesthesia, an 18 gauge introducer needle was advanced under direct ultrasound from a midline anterior approach into the bladder. There was return of urine. Amplatz guidewire inserted followed by tract dilatation to insert a 14 French catheter. Catheter position confirmed within the bladder with ultrasound and fluoroscopy. Images obtained for documentation. Catheter secured with Prolene suture and  connected to external gravity drainage bag. Sterile dressing applied. No immediate complication. Patient tolerated the procedure well. IMPRESSION: Successful right nephrostomy replacement through the existing percutaneous tract. Successful fluoroscopic and ultrasound placement of a new suprapubic catheter (14 [place]). Electronically Signed   By: Jerilynn Mages.  Shick M.D.   On: 08/19/2018 14:15    Assessment/Plan: 1) metastatic bladder cancer-on Keytruda therapy with Dr. [name]  2) bilateral ureteral obstruction-status post left nephrostomy change 8/20, right nephrostomy exchange 8/23 -- Cr at baseline 1.65  3) urinary retention - s/p SP tube by IR 8/24.   Improving. Will follow.    LOS: 1 day   Charles Coffey 08/20/2018, 5:30 PM

## 2018-08-20 NOTE — Progress Notes (Deleted)
This note also relates to the following rows which could not be included: BP - Cannot attach notes to unvalidated device data MAP (mmHg) - Cannot attach notes to unvalidated device data Pulse Rate - Cannot attach notes to unvalidated device data ECG Heart Rate - Cannot attach notes to unvalidated device data Resp - Cannot attach notes to unvalidated device data SpO2 - Cannot attach notes to unvalidated device data  MD notified of low BP, instructed to continue to monitor

## 2018-08-20 NOTE — Progress Notes (Signed)
Referring Physician(s): Dr Georgena Spurling  Supervising Physician: Marybelle Killings  Patient Status:  [facility] - In-pt  Chief Complaint: Bladder Ca   Subjective:  B PCNs placed ----Rt replaced 8/24; Lt exchange 8/20 Original placement 11/2017 Supra pubic cath placed in IR 8/23 Tenderness of SP cath Rt PCN draining well- no complaints Left draining well-- always blood tinged per wife Cr improving      Allergies: Statins  Medications: Prior to Admission medications   Medication Sig Start Date End Date Taking? Authorizing Provider  acetaminophen (TYLENOL) 500 MG tablet Take 1,000 mg by mouth every 8 (eight) hours as needed for mild pain or moderate pain.   Yes [provider]  carvedilol (COREG) 3.125 MG tablet Take 1 tablet (3.125 mg total) by mouth 2 (two) times daily. 07/04/18 08/19/18 Yes Donne Hazel, MD  DULoxetine (CYMBALTA) 20 MG capsule Take 1 capsule (20 mg total) by mouth 2 (two) times daily. Take once daily for 2 weeks then increase to twice daily Patient taking differently: Take 20 mg by mouth See admin instructions. Take 20 mg by mouth once daily for 2 weeks then increase to twice daily 08/12/18  Yes Tanner, Lyndon Code., PA-C  feeding supplement (BOOST HIGH PROTEIN) LIQD Take 1 Container by mouth 2 (two) times daily between meals.   Yes [provider]  furosemide (LASIX) 40 MG tablet Take 1 tablet (40 mg total) by mouth 2 (two) times daily. Patient taking differently: Take 40 mg by mouth as needed for fluid or edema.  05/13/18  Yes End, Harrell Gave, MD  hydrOXYzine (ATARAX/VISTARIL) 25 MG tablet Take 1 tablet (25 mg total) by mouth every 4 (four) hours as needed for itching. 07/21/18  Yes [name], MD  lidocaine-prilocaine (EMLA) cream Apply 1 application topically as needed. Patient taking differently: Apply 1 application topically as needed (port access).  12/15/17  Yes Shadad, Mathis Dad, MD  LINZESS 290 MCG CAPS capsule TAKE 1 CAP BY MOUTH ONCE DAILY 30 MIN  PRIOR TO A MEAL. MUST HAVE OFFICE VISIT FOR FURTHER REFILLS Patient taking differently: Take 290 mcg by mouth daily.  05/12/18  Yes Pyrtle, Lajuan Lines, MD  megestrol (MEGACE) 40 MG/ML suspension TAKE 10ML BY MOUTH TWICE A DAY Patient taking differently: Take 400 mg by mouth 2 (two) times daily as needed (weight gain).  05/13/18  Yes Wyatt Portela, MD  Melatonin 3 MG CAPS Take 6 mg by mouth at bedtime as needed (sleep).   Yes [provider]  mirabegron ER (MYRBETRIQ) 25 MG TB24 tablet Take 1 tablet (25 mg total) by mouth daily. 02/04/18  Yes Short, Noah Delaine, MD  nitroGLYCERIN (NITROSTAT) 0.4 MG SL tablet Place 1 tablet (0.4 mg total) under the tongue every 5 (five) minutes as needed. 08/19/17 08/19/18 Yes Dunn, Dayna N, PA-C  oxyCODONE (OXYCONTIN) 10 mg 12 hr tablet Take 1 tablet (10 mg total) by mouth 2 (two) times daily. Patient taking differently: Take 10 mg by mouth at bedtime.  07/22/18  Yes Wyatt Portela, MD  oxyCODONE-acetaminophen (PERCOCET) 10-325 MG tablet Take 1 tablet by mouth every 4 (four) hours as needed for pain. 08/12/18  Yes Tanner, Lyndon Code., PA-C  pembrolizumab [facility]) 100 MG/4ML SOLN Given at infusion center every 3 weeks   Yes [provider]  pramipexole (MIRAPEX) 1 MG tablet TAKE 1 AND 1/2 TABLETS BY MOUTH AT BEDTIME Patient taking differently: Take 1.5 mg by mouth every evening.  06/29/18  Yes [name], MD  predniSONE (  DELTASONE) 5 MG tablet Take 1 tablet (5 mg total) by mouth daily with breakfast. 08/12/18  Yes Tanner, Lyndon Code., PA-C  ranitidine (ZANTAC) 300 MG tablet TAKE 1 TABLET BY MOUTH TWICE A DAY Patient taking differently: Take 300 mg by mouth 2 (two) times daily.  07/11/18  Yes Irene Shipper, MD  Tetrahydroz-Glyc-Hyprom-PEG (VISINE MAXIMUM REDNESS RELIEF OP) Place 2 drops into both eyes 2 (two) times daily as needed (dry eyes).    Yes [provider]  triamcinolone cream (KENALOG) 0.1 % Apply topically 3 (three) times daily. To affected areas  07/04/18  Yes Donne Hazel, MD  guaiFENesin-codeine 100-10 MG/5ML syrup Take 5 mLs by mouth every 6 (six) hours as needed for cough. Patient not taking: Reported on 08/19/2018 06/13/18   Harle Stanford., PA-C  prochlorperazine (COMPAZINE) 10 MG tablet Take 1 tablet (10 mg total) by mouth every 6 (six) hours as needed for nausea or vomiting. Patient not taking: Reported on 08/15/2018 12/15/17   Wyatt Portela, MD  QUEtiapine (SEROQUEL) 25 MG tablet TAKE 1 TABLET BY MOUTH EVERYDAY AT BEDTIME Patient not taking: No sig reported 08/03/18   [name], MD     Vital Signs: BP (!) 132/56 (BP Location: Left Arm)   Pulse 73   Temp 100.2 F (37.9 C) (Axillary)   Resp (!) 22   Ht 5\' 8"  (1.727 m)   Wt 175 lb 14.8 oz (79.8 kg)   SpO2 94%   BMI 26.75 kg/m   Physical Exam  Abdominal: Soft.  Neurological: He is alert.  Skin: Skin is warm and dry.  All sites clean and dry Rt PCN draining clear yellow - no bleeding Lt PCN blood tinged urine SP cath clear yellow OP    Vitals reviewed.   Imaging: Ir Fluoro Guide Ndl Plmt / Bx  Result Date: 08/19/2018 INDICATION: Hydronephrosis, accidental removal of the right nephrostomy catheter, urinary retention EXAM: Fluoroscopic right nephrostomy tube replacement through the existing tract Ultrasound and fluoroscopic suprapubic catheter insertion Date:  08/19/2018 08/19/2018 1:39 pm Radiologist:  Jerilynn Mages. Daryll Brod, MD Guidance:  Ultrasound and fluoroscopy COMPARISON:  04/11/2018 FLUOROSCOPY TIME:  Two minutes 36 seconds (68 mGy). COMPLICATIONS: None immediate. MEDICATIONS: 1% lidocaine local ANESTHESIA/SEDATION: A total of Versed 2.0 mg and Fentanyl 100 mcg was administered intravenously. Moderate Sedation Time: 30 minutes. The patient's level of consciousness and vital signs were monitored continuously by radiology nursing throughout the procedure under my direct supervision. CONTRAST:  20 cc PROCEDURE: Informed written consent was obtained from the patient after  a thorough discussion of the procedural risks, benefits and alternatives. All questions were addressed. Maximal Sterile Barrier Technique was utilized including caps, mask, sterile gowns, sterile gloves, sterile drape, hand hygiene and skin antiseptic. A timeout was performed prior to the initiation of the procedure. Right nephrostomy replacement: Under sterile conditions and local anesthesia, the existing right nephrostomy percutaneous tract was cannulated with a 5 French catheter. Contrast injection demonstrates the small patent percutaneous tract. There is also surrounding retroperitoneal extravasation noted. Eventually the glidewire and catheter were advanced into the collecting system and ureter. Contrast injection confirms position within the collecting system. Amplatz guidewire inserted followed by a 10 French nephrostomy with the retention loop formed the renal pelvis. Position confirmed with fluoroscopy and contrast injection. Images obtained for documentation. Catheter secured with a Prolene suture and a sterile dressing. Gravity drainage bag connected. No immediate complication. Patient tolerated the procedure well. Suprapubic catheter insertion: Patient was repositioned supine. Preliminary ultrasound performed.  Distended bladder localized in the midline. Images obtained for documentation. Under sterile conditions and local anesthesia, an 18 gauge introducer needle was advanced under direct ultrasound from a midline anterior approach into the bladder. There was return of urine. Amplatz guidewire inserted followed by tract dilatation to insert a 14 French catheter. Catheter position confirmed within the bladder with ultrasound and fluoroscopy. Images obtained for documentation. Catheter secured with Prolene suture and connected to external gravity drainage bag. Sterile dressing applied. No immediate complication. Patient tolerated the procedure well. IMPRESSION: Successful right nephrostomy replacement  through the existing percutaneous tract. Successful fluoroscopic and ultrasound placement of a new suprapubic catheter (14 [place]). Electronically Signed   By: Jerilynn Mages.  Shick M.D.   On: 08/19/2018 14:15   Ir Nephrostomy Exchange Right  Result Date: 08/19/2018 INDICATION: Hydronephrosis, accidental removal of the right nephrostomy catheter, urinary retention EXAM: Fluoroscopic right nephrostomy tube replacement through the existing tract Ultrasound and fluoroscopic suprapubic catheter insertion Date:  08/19/2018 08/19/2018 1:39 pm Radiologist:  Jerilynn Mages. Daryll Brod, MD Guidance:  Ultrasound and fluoroscopy COMPARISON:  04/11/2018 FLUOROSCOPY TIME:  Two minutes 36 seconds (68 mGy). COMPLICATIONS: None immediate. MEDICATIONS: 1% lidocaine local ANESTHESIA/SEDATION: A total of Versed 2.0 mg and Fentanyl 100 mcg was administered intravenously. Moderate Sedation Time: 30 minutes. The patient's level of consciousness and vital signs were monitored continuously by radiology nursing throughout the procedure under my direct supervision. CONTRAST:  20 cc PROCEDURE: Informed written consent was obtained from the patient after a thorough discussion of the procedural risks, benefits and alternatives. All questions were addressed. Maximal Sterile Barrier Technique was utilized including caps, mask, sterile gowns, sterile gloves, sterile drape, hand hygiene and skin antiseptic. A timeout was performed prior to the initiation of the procedure. Right nephrostomy replacement: Under sterile conditions and local anesthesia, the existing right nephrostomy percutaneous tract was cannulated with a 5 French catheter. Contrast injection demonstrates the small patent percutaneous tract. There is also surrounding retroperitoneal extravasation noted. Eventually the glidewire and catheter were advanced into the collecting system and ureter. Contrast injection confirms position within the collecting system. Amplatz guidewire inserted followed by a 10  French nephrostomy with the retention loop formed the renal pelvis. Position confirmed with fluoroscopy and contrast injection. Images obtained for documentation. Catheter secured with a Prolene suture and a sterile dressing. Gravity drainage bag connected. No immediate complication. Patient tolerated the procedure well. Suprapubic catheter insertion: Patient was repositioned supine. Preliminary ultrasound performed. Distended bladder localized in the midline. Images obtained for documentation. Under sterile conditions and local anesthesia, an 18 gauge introducer needle was advanced under direct ultrasound from a midline anterior approach into the bladder. There was return of urine. Amplatz guidewire inserted followed by tract dilatation to insert a 14 French catheter. Catheter position confirmed within the bladder with ultrasound and fluoroscopy. Images obtained for documentation. Catheter secured with Prolene suture and connected to external gravity drainage bag. Sterile dressing applied. No immediate complication. Patient tolerated the procedure well. IMPRESSION: Successful right nephrostomy replacement through the existing percutaneous tract. Successful fluoroscopic and ultrasound placement of a new suprapubic catheter (14 [place]). Electronically Signed   By: Jerilynn Mages.  Shick M.D.   On: 08/19/2018 14:15    Labs:  CBC: Recent Labs    08/12/18 1239 08/18/18 2331 08/19/18 0813 08/20/18 0335  WBC 4.5 10.8* 11.3* 8.2  HGB 8.1* 10.7* 10.5* 9.8*  HCT 24.0* 30.4* 30.5* 28.6*  PLT 68* 55* 62* 47*    COAGS: Recent Labs    12/03/17 0550 12/27/17 1227 07/04/18 0346  08/19/18 0813  INR 1.22 1.13 1.22 1.23  APTT  --  33  --  36    BMP: Recent Labs    08/12/18 1239 08/18/18 2331 08/19/18 0813 08/20/18 0335  NA 137 128* 134* 137  K 4.7 3.8 4.0 4.2  CL 108 99 105 109  CO2 21* 18* 17* 19*  GLUCOSE 100* 126* 136* 117*  BUN 23 30* 32* 29*  CALCIUM 9.7 9.3 9.3 9.0  CREATININE 1.61* 1.77* 1.77*  1.65*  GFRNONAA 38* 34* 34* 37*  GFRAA 45* 40* 40* 43*    LIVER FUNCTION TESTS: Recent Labs    06/30/18 2009 07/01/18 0448 07/22/18 1226 08/12/18 1239  BILITOT 0.4 0.9 0.3 0.4  AST 15 16 16  14*  ALT 15 13 12 12   ALKPHOS 48 42 57 61  PROT 6.6 6.0* 7.1 7.2  ALBUMIN 3.3* 3.0* 3.9 3.8    Assessment and Plan:  Bladder Ca B PCNs intact SP cath in place All draining well Will follow Plan per Dr Lonny Prude  Electronically Signed: Monia Sabal A, PA-C 08/20/2018, 11:55 AM   I spent a total of 25 Minutes at the the patient's bedside AND on the patient's hospital floor or unit, greater than 50% of which was counseling/coordinating care for B PCNs; SP cath

## 2018-08-20 NOTE — Progress Notes (Addendum)
PROGRESS NOTE    Charles Coffey  ION:[id number] DOB: 08-Mar-1935 DOA: 08/18/2018 PCP: [name], MD   Brief Narrative: Charles Coffey is a 83 y.o. male with medical history significant for coronary artery disease status post CABG in 2002, chronic combined systolic & diastolic CHF, chronic kidney disease stage III, chronic anemia and thrombocytopenia, chronic back pain, and urothelial carcinoma of the bladder with bilateral hydronephrosis and percutaneous nephrostomy tubes. She presented with suprapubic and back pain in addition to dislodged right nephrostomy tube and also found to meet sepsis criteria.   Assessment & Plan:   Principal Problem:   Sepsis due to urinary tract infection ([place]) Active Problems:   Essential hypertension   Coronary artery disease of native heart with stable angina pectoris (HCC)   Chronic low back pain   Anemia   Chronic kidney disease, stage 3 (HCC)   Hydronephrosis due to obstructive malignant neoplasm of bladder (HCC)   Urothelial carcinoma of bladder (HCC)   Thrombocytopenia (HCC)   Chronic systolic heart failure (HCC)   Hyponatremia   Sepsis Secondary to UTI in setting of acute urinary retention and dislodged nephrostomy tube. Urine culture significant for staph aureus species. -Urine/blood culture pending -Continue Vanc/Cefepime  Suprapubic/flank pain In setting of urinary retention and UTI. S/p suprapubic catheter. Improved.  Chronic combined systolic and diastolic heart failure Hypovolemic on admission. Given IV fluids. Last EF of 25-30%. Stable.  Urothelial carcinoma of the bladder Bilateral nephrostomy tubes Both tubes replaced recently. Right tube replaced this admission.  CKD stage III Baseline of 1.7. Stable.  Anemia Baseline of 8. Elevated in setting of hypovolemia. Trended down slightly. Above baseline.  Thrombocytopenia Chronic. Slightly down in setting of acute infection.  Hyponatremia Transient. In setting of  hypovolemia. Resolved.  Chronic pain -Continue Oxycontin and oxycodone  CAD -Continue Coreg  Delirium This is a chronic issue. Worsened in the hospital. Also worsened in setting of infection.   DVT prophylaxis: SCDs Code Status:   Code Status: DNR Family Communication: Wife at bedside Disposition Plan: Discharge pending medical improvement   Consultants:   Urology  Interventional radiology  Procedures:   None  Antimicrobials:  Vancomycin  Cefepime    Subjective: Pain improved. Confusion overnight.  Objective: Vitals:   08/20/18 0800 08/20/18 1000 08/20/18 1108 08/20/18 1200  BP: (!) 132/56     Pulse:      Resp: (!) 22     Temp: 98.1 F (36.7 C) 99.9 F (37.7 C) 100.2 F (37.9 C) 98.9 F (37.2 C)  TempSrc: Oral Oral Axillary Oral  SpO2: 94%     Weight:      Height:        Intake/Output Summary (Last 24 hours) at 08/20/2018 1419 Last data filed at 08/20/2018 2440 Gross per 24 hour  Intake 1219.69 ml  Output 1250 ml  Net -30.31 ml   Filed Weights   08/19/18 0300 08/20/18 0457  Weight: 80.2 kg 79.8 kg    Examination:  General exam: Appears calm and comfortable Respiratory system: Clear to auscultation. Respiratory effort normal. Cardiovascular system: S1 & S2 heard, Normal rate with regular rhythm. Gastrointestinal system: Abdomen is nondistended, soft and nontender. No organomegaly or masses felt. Normal bowel sounds heard. Left nephrostomy tube site with blood on dressing and right nephrostomy tube site with dried blood; both sites without purulent discharge or significant erythema. Central nervous system: Alert and oriented to person/place. No focal neurological deficits. Extremities: No edema. No calf tenderness Skin: No cyanosis. No rashes  Psychiatry: Judgement and insight appear impaired slightly. Mood & affect appropriate.   Data Reviewed: I have personally reviewed following labs and imaging studies  CBC: Recent Labs  Lab  08/18/18 2331 08/19/18 0813 08/20/18 0335  WBC 10.8* 11.3* 8.2  NEUTROABS 8.4* 9.8*  --   HGB 10.7* 10.5* 9.8*  HCT 30.4* 30.5* 28.6*  MCV 92.4 92.1 94.4  PLT 55* 62* 47*   Basic Metabolic Panel: Recent Labs  Lab 08/18/18 2331 08/19/18 0813 08/20/18 0335  NA 128* 134* 137  K 3.8 4.0 4.2  CL 99 105 109  CO2 18* 17* 19*  GLUCOSE 126* 136* 117*  BUN 30* 32* 29*  CREATININE 1.77* 1.77* 1.65*  CALCIUM 9.3 9.3 9.0   GFR: Estimated Creatinine Clearance: 34 mL/min (A) (by C-G formula based on SCr of 1.65 mg/dL (H)). Liver Function Tests: No results for input(s): AST, ALT, ALKPHOS, BILITOT, PROT, ALBUMIN in the last 168 hours. No results for input(s): LIPASE, AMYLASE in the last 168 hours. No results for input(s): AMMONIA in the last 168 hours. Coagulation Profile: Recent Labs  Lab 08/19/18 0813  INR 1.23   Cardiac Enzymes: No results for input(s): CKTOTAL, CKMB, CKMBINDEX, TROPONINI in the last 168 hours. BNP (last 3 results) No results for input(s): PROBNP in the last 8760 hours. HbA1C: No results for input(s): HGBA1C in the last 72 hours. CBG: No results for input(s): GLUCAP in the last 168 hours. Lipid Profile: No results for input(s): CHOL, HDL, LDLCALC, TRIG, CHOLHDL, LDLDIRECT in the last 72 hours. Thyroid Function Tests: No results for input(s): TSH, T4TOTAL, FREET4, T3FREE, THYROIDAB in the last 72 hours. Anemia Panel: No results for input(s): VITAMINB12, FOLATE, FERRITIN, TIBC, IRON, RETICCTPCT in the last 72 hours. Sepsis Labs: Recent Labs  Lab 08/19/18 0813  LATICACIDVEN 0.8    Recent Results (from the past 240 hour(s))  Urine culture     Status: Abnormal (Preliminary result)   Collection Time: 08/19/18 12:05 AM  Result Value Ref Range Status   Specimen Description   Final    URINE, RANDOM NEPHROSTOMY BAG LEFT KIDNEY Performed at [facility], [address] [place], [place] [address]    Special Requests   Final    NONE Performed at  [facility], [address] [place], [place] [address]    Culture (A)  Final    >=100,000 COLONIES/mL STAPHYLOCOCCUS AUREUS SUSCEPTIBILITIES TO FOLLOW Performed at [facility], [place] [address] [place], [place] [address]    Report Status PENDING  Incomplete  Blood culture (routine x 2)     Status: None (Preliminary result)   Collection Time: 08/19/18 12:50 AM  Result Value Ref Range Status   Specimen Description   Final    BLOOD LEFT FOREARM Performed at [facility], [address] [place], [place] [address]    Special Requests   Final    BOTTLES DRAWN AEROBIC AND ANAEROBIC Blood Culture adequate volume Performed at [facility], [address], [place], [place] [address]    Culture   Final    NO GROWTH 1 DAY Performed at [facility], [place] [address], [place], [place] [address]    Report Status PENDING  Incomplete  Blood culture (routine x 2)     Status: None (Preliminary result)   Collection Time: 08/19/18 12:50 AM  Result Value Ref Range Status   Specimen Description   Final    BLOOD RIGHT FOREARM  Performed at [facility], [address] [place], [place] [address]    Special Requests   Final    BOTTLES DRAWN AEROBIC AND ANAEROBIC Blood Culture adequate volume Performed at [facility], [address] [place], [place] [address]    Culture   Final    NO GROWTH 1 DAY Performed at [facility], [place] [address], [place], [place] [address]    Report Status PENDING  Incomplete  MRSA PCR Screening     Status: Abnormal   Collection Time: 08/19/18  2:57 AM  Result Value Ref Range Status   MRSA by PCR POSITIVE (A) NEGATIVE Final    Comment:        The GeneXpert MRSA Assay (FDA approved for NASAL specimens only), is one component of a comprehensive MRSA colonization surveillance program. It is not intended to diagnose MRSA infection nor to guide or monitor treatment for MRSA infections. RESULT  CALLED TO, READ BACK BY AND VERIFIED WITH: Alcide Clever [id number] @ [address] Performed at [place] [address], [place], [place] [address]          Radiology Studies: Ir Cyndy Freeze Guide Ndl Plmt / Bx  Result Date: 08/19/2018 INDICATION: Hydronephrosis, accidental removal of the right nephrostomy catheter, urinary retention EXAM: Fluoroscopic right nephrostomy tube replacement through the existing tract Ultrasound and fluoroscopic suprapubic catheter insertion Date:  08/19/2018 08/19/2018 1:39 pm Radiologist:  Jerilynn Mages. Daryll Brod, MD Guidance:  Ultrasound and fluoroscopy COMPARISON:  04/11/2018 FLUOROSCOPY TIME:  Two minutes 36 seconds (68 mGy). COMPLICATIONS: None immediate. MEDICATIONS: 1% lidocaine local ANESTHESIA/SEDATION: A total of Versed 2.0 mg and Fentanyl 100 mcg was administered intravenously. Moderate Sedation Time: 30 minutes. The patient's level of consciousness and vital signs were monitored continuously by radiology nursing throughout the procedure under my direct supervision. CONTRAST:  20 cc PROCEDURE: Informed written consent was obtained from the patient after a thorough discussion of the procedural risks, benefits and alternatives. All questions were addressed. Maximal Sterile Barrier Technique was utilized including caps, mask, sterile gowns, sterile gloves, sterile drape, hand hygiene and skin antiseptic. A timeout was performed prior to the initiation of the procedure. Right nephrostomy replacement: Under sterile conditions and local anesthesia, the existing right nephrostomy percutaneous tract was cannulated with a 5 French catheter. Contrast injection demonstrates the small patent percutaneous tract. There is also surrounding retroperitoneal extravasation noted. Eventually the glidewire and catheter were advanced into the collecting system and ureter. Contrast injection confirms position within the collecting system. Amplatz guidewire inserted  followed by a 10 French nephrostomy with the retention loop formed the renal pelvis. Position confirmed with fluoroscopy and contrast injection. Images obtained for documentation. Catheter secured with a Prolene suture and a sterile dressing. Gravity drainage bag connected. No immediate complication. Patient tolerated the procedure well. Suprapubic catheter insertion: Patient was repositioned supine. Preliminary ultrasound performed. Distended bladder localized in the midline. Images obtained for documentation. Under sterile conditions and local anesthesia, an 18 gauge introducer needle was advanced under direct ultrasound from a midline anterior approach into the bladder. There was return of urine. Amplatz guidewire inserted followed by tract dilatation to insert a 14 French catheter. Catheter position confirmed within the bladder with ultrasound and fluoroscopy. Images obtained for documentation. Catheter secured with Prolene suture and connected to external gravity drainage bag. Sterile dressing applied. No immediate complication. Patient tolerated the procedure well. IMPRESSION: Successful right nephrostomy replacement through the existing percutaneous tract. Successful fluoroscopic and  ultrasound placement of a new suprapubic catheter (14 [place]). Electronically Signed   By: Jerilynn Mages.  Shick M.D.   On: 08/19/2018 14:15   Ir Nephrostomy Exchange Right  Result Date: 08/19/2018 INDICATION: Hydronephrosis, accidental removal of the right nephrostomy catheter, urinary retention EXAM: Fluoroscopic right nephrostomy tube replacement through the existing tract Ultrasound and fluoroscopic suprapubic catheter insertion Date:  08/19/2018 08/19/2018 1:39 pm Radiologist:  Jerilynn Mages. Daryll Brod, MD Guidance:  Ultrasound and fluoroscopy COMPARISON:  04/11/2018 FLUOROSCOPY TIME:  Two minutes 36 seconds (68 mGy). COMPLICATIONS: None immediate. MEDICATIONS: 1% lidocaine local ANESTHESIA/SEDATION: A total of Versed 2.0 mg and Fentanyl 100  mcg was administered intravenously. Moderate Sedation Time: 30 minutes. The patient's level of consciousness and vital signs were monitored continuously by radiology nursing throughout the procedure under my direct supervision. CONTRAST:  20 cc PROCEDURE: Informed written consent was obtained from the patient after a thorough discussion of the procedural risks, benefits and alternatives. All questions were addressed. Maximal Sterile Barrier Technique was utilized including caps, mask, sterile gowns, sterile gloves, sterile drape, hand hygiene and skin antiseptic. A timeout was performed prior to the initiation of the procedure. Right nephrostomy replacement: Under sterile conditions and local anesthesia, the existing right nephrostomy percutaneous tract was cannulated with a 5 French catheter. Contrast injection demonstrates the small patent percutaneous tract. There is also surrounding retroperitoneal extravasation noted. Eventually the glidewire and catheter were advanced into the collecting system and ureter. Contrast injection confirms position within the collecting system. Amplatz guidewire inserted followed by a 10 French nephrostomy with the retention loop formed the renal pelvis. Position confirmed with fluoroscopy and contrast injection. Images obtained for documentation. Catheter secured with a Prolene suture and a sterile dressing. Gravity drainage bag connected. No immediate complication. Patient tolerated the procedure well. Suprapubic catheter insertion: Patient was repositioned supine. Preliminary ultrasound performed. Distended bladder localized in the midline. Images obtained for documentation. Under sterile conditions and local anesthesia, an 18 gauge introducer needle was advanced under direct ultrasound from a midline anterior approach into the bladder. There was return of urine. Amplatz guidewire inserted followed by tract dilatation to insert a 14 French catheter. Catheter position confirmed  within the bladder with ultrasound and fluoroscopy. Images obtained for documentation. Catheter secured with Prolene suture and connected to external gravity drainage bag. Sterile dressing applied. No immediate complication. Patient tolerated the procedure well. IMPRESSION: Successful right nephrostomy replacement through the existing percutaneous tract. Successful fluoroscopic and ultrasound placement of a new suprapubic catheter (14 [place]). Electronically Signed   By: Jerilynn Mages.  Shick M.D.   On: 08/19/2018 14:15        Scheduled Meds: . carvedilol  3.125 mg Oral BID WC  . Chlorhexidine Gluconate Cloth  6 each Topical Q0600  . DULoxetine  20 mg Oral Daily  . feeding supplement (ENSURE ENLIVE)  237 mL Oral BID BM  . linaclotide  290 mcg Oral Daily  . megestrol  400 mg Oral BID  . mupirocin ointment  1 application Nasal BID  . oxyCODONE  10 mg Oral QHS  . pramipexole  1.5 mg Oral QPM  . predniSONE  5 mg Oral Q breakfast  . QUEtiapine  25 mg Oral QHS  . sodium chloride flush  3 mL Intravenous Q12H   Continuous Infusions: . sodium chloride Stopped (08/19/18 0616)  . sodium chloride Stopped (08/19/18 1122)  . ceFEPime (MAXIPIME) IV Stopped (08/20/18 1214)  . famotidine (PEPCID) IV Stopped (08/20/18 1054)  . vancomycin Stopped (08/20/18 1055)     LOS: 1  day     Cordelia Poche, MD [facility] 08/20/2018, 2:19 PM Pager: [phone]  If 7PM-7AM, please contact night-coverage [email or web address] 08/20/2018, 2:19 PM

## 2018-08-20 NOTE — Progress Notes (Signed)
MD notified of BP 91/46 map of 60, instructed to continue monitoring at this time. Pt remains drowsy, bedtime Oxycodone was held, but nighttime dose of seroquel was administered. Will continue to monitor pt closely at this time.

## 2018-08-21 ENCOUNTER — Inpatient Hospital Stay (HOSPITAL_COMMUNITY): Payer: Medicare HMO

## 2018-08-21 DIAGNOSIS — I313 Pericardial effusion (noninflammatory): Secondary | ICD-10-CM

## 2018-08-21 LAB — CBC
HCT: 28.5 % — ABNORMAL LOW (ref 39.0–52.0)
Hemoglobin: 9.7 g/dL — ABNORMAL LOW (ref 13.0–17.0)
MCH: 31.8 pg (ref 26.0–34.0)
MCHC: 34 g/dL (ref 30.0–36.0)
MCV: 93.4 fL (ref 78.0–100.0)
PLATELETS: 53 10*3/uL — AB (ref 150–400)
RBC: 3.05 MIL/uL — AB (ref 4.22–5.81)
RDW: 17.2 % — AB (ref 11.5–15.5)
WBC: 5.7 10*3/uL (ref 4.0–10.5)

## 2018-08-21 LAB — BASIC METABOLIC PANEL
Anion gap: 6 (ref 5–15)
BUN: 23 mg/dL (ref 8–23)
CALCIUM: 9.3 mg/dL (ref 8.9–10.3)
CO2: 21 mmol/L — ABNORMAL LOW (ref 22–32)
CREATININE: 1.25 mg/dL — AB (ref 0.61–1.24)
Chloride: 107 mmol/L (ref 98–111)
GFR calc Af Amer: >60 mL/min (ref >60)
GFR, EST NON AFRICAN AMERICAN: 52 mL/min — AB (ref >60)
GLUCOSE: 126 mg/dL — AB (ref 70–99)
POTASSIUM: 3.7 mmol/L (ref 3.5–5.1)
SODIUM: 134 mmol/L — AB (ref 135–145)

## 2018-08-21 LAB — URINE CULTURE: Culture: >=100000 — AB

## 2018-08-21 LAB — ECHOCARDIOGRAM LIMITED
Height: 68 in
WEIGHTICAEL: 2814.83 [oz_av]

## 2018-08-21 MED ORDER — SODIUM CHLORIDE 0.9% FLUSH
10.0000 mL | INTRAVENOUS | Status: DC | PRN
  Administered 2018-08-22: 10 mL
  Filled 2018-08-21: qty 40

## 2018-08-21 MED ORDER — CALCIUM CARBONATE ANTACID 500 MG PO CHEW
1.0000 | CHEWABLE_TABLET | Freq: Once | ORAL | Status: AC
  Administered 2018-08-21: 200 mg via ORAL

## 2018-08-21 MED ORDER — LIDOCAINE-PRILOCAINE 2.5-2.5 % EX CREA
TOPICAL_CREAM | Freq: Once | CUTANEOUS | Status: AC
  Administered 2018-08-21: 10:00:00 via TOPICAL
  Filled 2018-08-21: qty 5

## 2018-08-21 MED ORDER — FUROSEMIDE 40 MG PO TABS
40.0000 mg | ORAL_TABLET | Freq: Two times a day (BID) | ORAL | Status: DC
  Administered 2018-08-21 – 2018-08-24 (×6): 40 mg via ORAL
  Filled 2018-08-21 (×6): qty 1

## 2018-08-21 MED ORDER — CHLORHEXIDINE GLUCONATE CLOTH 2 % EX PADS
6.0000 | MEDICATED_PAD | Freq: Every day | CUTANEOUS | Status: DC
  Administered 2018-08-21 – 2018-08-24 (×3): 6 via TOPICAL

## 2018-08-21 MED ORDER — ALUM & MAG HYDROXIDE-SIMETH 200-200-20 MG/5ML PO SUSP
30.0000 mL | ORAL | Status: DC | PRN
  Administered 2018-08-21 (×2): 30 mL via ORAL
  Filled 2018-08-21 (×2): qty 30

## 2018-08-21 MED ORDER — DOXYCYCLINE HYCLATE 100 MG PO TABS
100.0000 mg | ORAL_TABLET | Freq: Two times a day (BID) | ORAL | Status: DC
  Administered 2018-08-21 – 2018-08-24 (×7): 100 mg via ORAL
  Filled 2018-08-21 (×7): qty 1

## 2018-08-21 NOTE — Progress Notes (Signed)
Educated pt and family on importance of turning to avoid skin breakdown and pressure ulcers. Pt and family verbalized understanding. Pt agrees to turn q. 2 hrs.

## 2018-08-21 NOTE — Progress Notes (Signed)
  Echocardiogram 2D Echocardiogram has been performed.  [name] 08/21/2018, 10:55 AM

## 2018-08-21 NOTE — Progress Notes (Signed)
PROGRESS NOTE    Charles Coffey  WNU:[id number] DOB: February 20, 1935 DOA: 08/18/2018 PCP: [name], MD   Brief Narrative: Charles Coffey is a 83 y.o. male with medical history significant for coronary artery disease status post CABG in 2002, chronic combined systolic & diastolic CHF, chronic kidney disease stage III, chronic anemia and thrombocytopenia, chronic back pain, and urothelial carcinoma of the bladder with bilateral hydronephrosis and percutaneous nephrostomy tubes. She presented with suprapubic and back pain in addition to dislodged right nephrostomy tube and also found to meet sepsis criteria.   Assessment & Plan:   Principal Problem:   Sepsis due to urinary tract infection ([facility]) Active Problems:   Essential hypertension   Coronary artery disease of native heart with stable angina pectoris (HCC)   Chronic low back pain   Anemia   Chronic kidney disease, stage 3 (HCC)   Hydronephrosis due to obstructive malignant neoplasm of bladder (HCC)   Urothelial carcinoma of bladder (HCC)   Thrombocytopenia (HCC)   Chronic systolic heart failure (HCC)   Hyponatremia   Sepsis Secondary to UTI in setting of acute urinary retention and dislodged nephrostomy tube. Urine culture significant for staph aureus species. -Urine/blood culture pending -Continue Vanc/Cefepime  Suprapubic/flank pain In setting of urinary retention and UTI. S/p suprapubic catheter. Improved.  Chronic combined systolic and diastolic heart failure Hypovolemic on admission. Given IV fluids. Last EF of 25-30%. Stable.  Urothelial carcinoma of the bladder Bilateral nephrostomy tubes Both tubes replaced recently. Right tube replaced this admission.  CKD stage III Baseline of 1.7. Stable.  Anemia Baseline of 8. Elevated in setting of hypovolemia. Trended down slightly. Above baseline.  Thrombocytopenia Chronic. Slightly down in setting of acute infection.  Hyponatremia Transient. In setting of  hypovolemia. Resolved.  Chronic pain -Continue Oxycontin and oxycodone  CAD -Continue Coreg  Delirium This is a chronic issue. Worsened in the hospital. Also worsened in setting of infection.  Diminished heart sounds Possible pericardial effusion. -Will get limited Transthoracic Echocardiogram to evaluate.   DVT prophylaxis: SCDs Code Status:   Code Status: DNR Family Communication: Wife at bedside Disposition Plan: Transfer to medical floor if Transthoracic Echocardiogram with no effusion. Discharge pending medical improvement   Consultants:   Urology  Interventional radiology  Procedures:   None  Antimicrobials:  Vancomycin  Cefepime    Subjective: Didn't sleep well overnight. Poor appetite.  Objective: Vitals:   08/20/18 2346 08/21/18 0000 08/21/18 0400 08/21/18 0800  BP:  (!) 129/47 (!) 141/85   Pulse:  90 72   Resp:  20 20   Temp: 98.2 F (36.8 C)   (!) 97.3 F (36.3 C)  TempSrc: Oral   Oral  SpO2:  99% 97%   Weight:      Height:        Intake/Output Summary (Last 24 hours) at 08/21/2018 1000 Last data filed at 08/21/2018 0716 Gross per 24 hour  Intake 202.95 ml  Output 850 ml  Net -647.05 ml   Filed Weights   08/19/18 0300 08/20/18 0457  Weight: 80.2 kg 79.8 kg    Examination:  General exam: Appears calm and comfortable Respiratory system: Clear to auscultation. Respiratory effort normal. Cardiovascular system: S1 & S2 diminished, RRR. No murmur. Gastrointestinal system: Abdomen is nondistended, soft and nontender. No organomegaly or masses felt. Normal bowel sounds heard. Central nervous system: Alert. No focal neurological deficits. Extremities: No edema. No calf tenderness Skin: No cyanosis. No rashes Psychiatry: Judgement and insight appear normal. Mood & affect  appropriate.   Data Reviewed: I have personally reviewed following labs and imaging studies  CBC: Recent Labs  Lab 08/18/18 2331 08/19/18 0813 08/20/18 0335  WBC  10.8* 11.3* 8.2  NEUTROABS 8.4* 9.8*  --   HGB 10.7* 10.5* 9.8*  HCT 30.4* 30.5* 28.6*  MCV 92.4 92.1 94.4  PLT 55* 62* 47*   Basic Metabolic Panel: Recent Labs  Lab 08/18/18 2331 08/19/18 0813 08/20/18 0335  NA 128* 134* 137  K 3.8 4.0 4.2  CL 99 105 109  CO2 18* 17* 19*  GLUCOSE 126* 136* 117*  BUN 30* 32* 29*  CREATININE 1.77* 1.77* 1.65*  CALCIUM 9.3 9.3 9.0   GFR: Estimated Creatinine Clearance: 34 mL/min (A) (by C-G formula based on SCr of 1.65 mg/dL (H)). Liver Function Tests: No results for input(s): AST, ALT, ALKPHOS, BILITOT, PROT, ALBUMIN in the last 168 hours. No results for input(s): LIPASE, AMYLASE in the last 168 hours. No results for input(s): AMMONIA in the last 168 hours. Coagulation Profile: Recent Labs  Lab 08/19/18 0813  INR 1.23   Cardiac Enzymes: No results for input(s): CKTOTAL, CKMB, CKMBINDEX, TROPONINI in the last 168 hours. BNP (last 3 results) No results for input(s): PROBNP in the last 8760 hours. HbA1C: No results for input(s): HGBA1C in the last 72 hours. CBG: No results for input(s): GLUCAP in the last 168 hours. Lipid Profile: No results for input(s): CHOL, HDL, LDLCALC, TRIG, CHOLHDL, LDLDIRECT in the last 72 hours. Thyroid Function Tests: No results for input(s): TSH, T4TOTAL, FREET4, T3FREE, THYROIDAB in the last 72 hours. Anemia Panel: No results for input(s): VITAMINB12, FOLATE, FERRITIN, TIBC, IRON, RETICCTPCT in the last 72 hours. Sepsis Labs: Recent Labs  Lab 08/19/18 0813  LATICACIDVEN 0.8    Recent Results (from the past 240 hour(s))  Urine culture     Status: Abnormal   Collection Time: 08/19/18 12:05 AM  Result Value Ref Range Status   Specimen Description   Final    URINE, RANDOM NEPHROSTOMY BAG LEFT KIDNEY Performed at [facility], [address] [place], [place] [address]    Special Requests   Final    NONE Performed at [facility], [address] [place], [place] [address]     Culture (A)  Final    >=100,000 COLONIES/mL METHICILLIN RESISTANT STAPHYLOCOCCUS AUREUS   Report Status 08/21/2018 FINAL  Final   Organism ID, Bacteria METHICILLIN RESISTANT STAPHYLOCOCCUS AUREUS (A)  Final      Susceptibility   Methicillin resistant staphylococcus aureus - MIC*    CIPROFLOXACIN >=8 RESISTANT Resistant     GENTAMICIN <=0.5 SENSITIVE Sensitive     NITROFURANTOIN <=16 SENSITIVE Sensitive     OXACILLIN >=4 RESISTANT Resistant     TETRACYCLINE <=1 SENSITIVE Sensitive     VANCOMYCIN <=0.5 SENSITIVE Sensitive     TRIMETH/SULFA <=10 SENSITIVE Sensitive     CLINDAMYCIN <=0.25 SENSITIVE Sensitive     RIFAMPIN <=0.5 SENSITIVE Sensitive     Inducible Clindamycin NEGATIVE Sensitive     * >=100,000 COLONIES/mL METHICILLIN RESISTANT STAPHYLOCOCCUS AUREUS  Blood culture (routine x 2)     Status: None (Preliminary result)   Collection Time: 08/19/18 12:50 AM  Result Value Ref Range Status   Specimen Description   Final    BLOOD LEFT FOREARM Performed at [facility], [address] [place], [place] [address]    Special Requests   Final    BOTTLES DRAWN AEROBIC AND ANAEROBIC Blood Culture adequate  volume Performed at [facility], [address] [place], [place] [address]    Culture   Final    NO GROWTH 1 DAY Performed at [facility], [place] [address], [place], [place] [address]    Report Status PENDING  Incomplete  Blood culture (routine x 2)     Status: None (Preliminary result)   Collection Time: 08/19/18 12:50 AM  Result Value Ref Range Status   Specimen Description   Final    BLOOD RIGHT FOREARM Performed at [facility], [address] [place], [place] [address]    Special Requests   Final    BOTTLES DRAWN AEROBIC AND ANAEROBIC Blood Culture adequate volume Performed at [facility], [address], [place], [place] [address]    Culture   Final    NO GROWTH 1 DAY Performed at [facility], [place] [address] [place], [place] [address]    Report Status PENDING  Incomplete  MRSA PCR Screening     Status: Abnormal   Collection Time: 08/19/18  2:57 AM  Result Value Ref Range Status   MRSA by PCR POSITIVE (A) NEGATIVE Final    Comment:        The GeneXpert MRSA Assay (FDA approved for NASAL specimens only), is one component of a comprehensive MRSA colonization surveillance program. It is not intended to diagnose MRSA infection nor to guide or monitor treatment for MRSA infections. RESULT CALLED TO, READ BACK BY AND VERIFIED WITH: Alcide Clever [id number] @ [address] Performed at [facility] [address], [place], [place] [address]          Radiology Studies: Ir Cyndy Freeze Guide Ndl Plmt / Bx  Result Date: 08/19/2018 INDICATION: Hydronephrosis, accidental removal of the right nephrostomy catheter, urinary retention EXAM: Fluoroscopic right nephrostomy tube replacement through the existing tract Ultrasound and fluoroscopic suprapubic catheter insertion Date:  08/19/2018 08/19/2018 1:39 pm Radiologist:  Jerilynn Mages. Daryll Brod, MD Guidance:  Ultrasound and fluoroscopy COMPARISON:  04/11/2018 FLUOROSCOPY TIME:  Two minutes 36 seconds (68 mGy). COMPLICATIONS: None immediate. MEDICATIONS: 1% lidocaine local ANESTHESIA/SEDATION: A total of Versed 2.0 mg and Fentanyl 100 mcg was administered intravenously. Moderate Sedation Time: 30 minutes. The patient's level of consciousness and vital signs were monitored continuously by radiology nursing throughout the procedure under my direct supervision. CONTRAST:  20 cc PROCEDURE: Informed written consent was obtained from the patient after a thorough discussion of the procedural risks, benefits and alternatives. All questions were addressed. Maximal Sterile Barrier Technique was utilized including caps, mask, sterile gowns, sterile gloves, sterile drape, hand hygiene and skin antiseptic. A timeout was performed prior to the initiation of the  procedure. Right nephrostomy replacement: Under sterile conditions and local anesthesia, the existing right nephrostomy percutaneous tract was cannulated with a 5 French catheter. Contrast injection demonstrates the small patent percutaneous tract. There is also surrounding retroperitoneal extravasation noted. Eventually the glidewire and catheter were advanced into the collecting system and ureter. Contrast injection confirms position within the collecting system. Amplatz guidewire inserted followed by a 10 French nephrostomy with the retention loop formed the renal pelvis. Position confirmed with fluoroscopy and contrast injection. Images obtained for documentation. Catheter secured with a Prolene suture and a sterile dressing. Gravity drainage bag connected. No immediate complication. Patient tolerated the procedure well. Suprapubic catheter insertion: Patient was repositioned supine. Preliminary ultrasound performed. Distended bladder localized in the midline. Images obtained for documentation. Under sterile  conditions and local anesthesia, an 18 gauge introducer needle was advanced under direct ultrasound from a midline anterior approach into the bladder. There was return of urine. Amplatz guidewire inserted followed by tract dilatation to insert a 14 French catheter. Catheter position confirmed within the bladder with ultrasound and fluoroscopy. Images obtained for documentation. Catheter secured with Prolene suture and connected to external gravity drainage bag. Sterile dressing applied. No immediate complication. Patient tolerated the procedure well. IMPRESSION: Successful right nephrostomy replacement through the existing percutaneous tract. Successful fluoroscopic and ultrasound placement of a new suprapubic catheter (14 [place]). Electronically Signed   By: Jerilynn Mages.  Shick M.D.   On: 08/19/2018 14:15   Ir Nephrostomy Exchange Right  Result Date: 08/19/2018 INDICATION: Hydronephrosis, accidental removal of  the right nephrostomy catheter, urinary retention EXAM: Fluoroscopic right nephrostomy tube replacement through the existing tract Ultrasound and fluoroscopic suprapubic catheter insertion Date:  08/19/2018 08/19/2018 1:39 pm Radiologist:  Jerilynn Mages. Daryll Brod, MD Guidance:  Ultrasound and fluoroscopy COMPARISON:  04/11/2018 FLUOROSCOPY TIME:  Two minutes 36 seconds (68 mGy). COMPLICATIONS: None immediate. MEDICATIONS: 1% lidocaine local ANESTHESIA/SEDATION: A total of Versed 2.0 mg and Fentanyl 100 mcg was administered intravenously. Moderate Sedation Time: 30 minutes. The patient's level of consciousness and vital signs were monitored continuously by radiology nursing throughout the procedure under my direct supervision. CONTRAST:  20 cc PROCEDURE: Informed written consent was obtained from the patient after a thorough discussion of the procedural risks, benefits and alternatives. All questions were addressed. Maximal Sterile Barrier Technique was utilized including caps, mask, sterile gowns, sterile gloves, sterile drape, hand hygiene and skin antiseptic. A timeout was performed prior to the initiation of the procedure. Right nephrostomy replacement: Under sterile conditions and local anesthesia, the existing right nephrostomy percutaneous tract was cannulated with a 5 French catheter. Contrast injection demonstrates the small patent percutaneous tract. There is also surrounding retroperitoneal extravasation noted. Eventually the glidewire and catheter were advanced into the collecting system and ureter. Contrast injection confirms position within the collecting system. Amplatz guidewire inserted followed by a 10 French nephrostomy with the retention loop formed the renal pelvis. Position confirmed with fluoroscopy and contrast injection. Images obtained for documentation. Catheter secured with a Prolene suture and a sterile dressing. Gravity drainage bag connected. No immediate complication. Patient tolerated the  procedure well. Suprapubic catheter insertion: Patient was repositioned supine. Preliminary ultrasound performed. Distended bladder localized in the midline. Images obtained for documentation. Under sterile conditions and local anesthesia, an 18 gauge introducer needle was advanced under direct ultrasound from a midline anterior approach into the bladder. There was return of urine. Amplatz guidewire inserted followed by tract dilatation to insert a 14 French catheter. Catheter position confirmed within the bladder with ultrasound and fluoroscopy. Images obtained for documentation. Catheter secured with Prolene suture and connected to external gravity drainage bag. Sterile dressing applied. No immediate complication. Patient tolerated the procedure well. IMPRESSION: Successful right nephrostomy replacement through the existing percutaneous tract. Successful fluoroscopic and ultrasound placement of a new suprapubic catheter (14 [place]). Electronically Signed   By: Jerilynn Mages.  Shick M.D.   On: 08/19/2018 14:15        Scheduled Meds: . carvedilol  3.125 mg Oral BID WC  . Chlorhexidine Gluconate Cloth  6 each Topical Q0600  . DULoxetine  20 mg Oral Daily  . feeding supplement (ENSURE ENLIVE)  237 mL Oral BID BM  . lidocaine-prilocaine   Topical Once  . linaclotide  290 mcg Oral Daily  . megestrol  400 mg Oral BID  .  mupirocin ointment  1 application Nasal BID  . oxyCODONE  10 mg Oral QHS  . pramipexole  1.5 mg Oral QPM  . predniSONE  5 mg Oral Q breakfast  . sodium chloride flush  3 mL Intravenous Q12H   Continuous Infusions: . sodium chloride Stopped (08/19/18 0616)  . sodium chloride Stopped (08/19/18 1122)  . ceFEPime (MAXIPIME) IV Stopped (08/20/18 1214)  . famotidine (PEPCID) IV 20 mg (08/21/18 0950)  . vancomycin 750 mg (08/21/18 4840)     LOS: 2 days     Cordelia Poche, MD [facility] 08/21/2018, 10:00 AM Pager: [phone]  If 7PM-7AM, please contact  night-coverage [email or web address] 08/21/2018, 10:00 AM

## 2018-08-22 ENCOUNTER — Encounter: Payer: Self-pay | Admitting: General Practice

## 2018-08-22 ENCOUNTER — Telehealth: Payer: Self-pay | Admitting: *Deleted

## 2018-08-22 LAB — APTT: APTT: 37 s — AB (ref 24–36)

## 2018-08-22 LAB — PROTIME-INR
INR: 1.15
PROTHROMBIN TIME: 14.6 s (ref 11.4–15.2)

## 2018-08-22 MED ORDER — DIPHENHYDRAMINE-ZINC ACETATE 2-0.1 % EX CREA
TOPICAL_CREAM | Freq: Three times a day (TID) | CUTANEOUS | Status: DC | PRN
  Filled 2018-08-22: qty 28

## 2018-08-22 MED ORDER — FAMOTIDINE 20 MG PO TABS
20.0000 mg | ORAL_TABLET | Freq: Two times a day (BID) | ORAL | Status: DC
  Administered 2018-08-22 – 2018-08-24 (×4): 20 mg via ORAL
  Filled 2018-08-22 (×5): qty 1

## 2018-08-22 MED ORDER — BOOST PLUS PO LIQD
237.0000 mL | Freq: Three times a day (TID) | ORAL | Status: DC
  Administered 2018-08-22 – 2018-08-24 (×3): 237 mL via ORAL
  Filled 2018-08-22 (×7): qty 237

## 2018-08-22 MED ORDER — SODIUM CHLORIDE 0.9 % IV SOLN: INTRAVENOUS | Status: DC

## 2018-08-22 NOTE — Progress Notes (Signed)
Initial Nutrition Assessment  DOCUMENTATION CODES:   Non-severe (moderate) malnutrition in context of chronic illness  INTERVENTION:   Boost Plus chocolate TID- Each supplement provides 360kcal and 14g protein.    NUTRITION DIAGNOSIS:   Moderate Malnutrition related to chronic illness, cancer and cancer related treatments as evidenced by energy intake < 75% for > or equal to 1 month, moderate fat depletion, moderate muscle depletion, mild fat depletion.  GOAL:   Patient will meet greater than or equal to 90% of their needs  MONITOR:   Supplement acceptance, Weight trends, PO intake, Labs  REASON FOR ASSESSMENT:   Consult Poor PO  ASSESSMENT:   Patient with PMH significant for CAD s/p CABG in 2002, CHF, CKD III, chronic anemia, and urothelial carcinoma of the bladder (on Keytruda) with bilateral hydronephrosis s/p percutaneous nephrostomy tubes. Presents this admission with sepsis secondary to UTI in setting of acute urinary retention and dislodged nephrostomy tube.    8/23- replacement of right PCN, placement of suprapubic catheter  Pt endorses having a loss in appetite 2-3 weeks PTA due to nausea from suspected infection. States during this time period he would eat 1-2 meals and 1-2 Boost Plus per day. Intake has been off and on from the beginning of the year due to multiple nephrostomy tube infections. Pt denies any taste changes or trouble swallowing. He did not consume breakfast this morning because it looked "old". Pt requesting to have Boost Plus only as he is lactose intolerant. RD to order.   Pt endorses a UBW of 185 lb and a recent wt loss of 15 lb within the last 6 months. Records indicate pt weighed 176 lb 07/22/18 and 172 lb this admission (insignificant amount of wt loss for time frame). Nutrition-Focused physical exam completed.   Medications reviewed and include: lasix BID, prednisone Labs reviewed: Na 134 (L)   NUTRITION - FOCUSED PHYSICAL EXAM:    Most Recent  Value  Orbital Region  No depletion  Upper Arm Region  Moderate depletion  Thoracic and Lumbar Region  Mild depletion  Buccal Region  No depletion  Temple Region  Mild depletion  Clavicle Bone Region  Moderate depletion  Clavicle and Acromion Bone Region  Moderate depletion  Scapular Bone Region  Mild depletion  Dorsal Hand  Mild depletion  Patellar Region  Mild depletion  Anterior Thigh Region  Mild depletion  Posterior Calf Region  Mild depletion  Edema (RD Assessment)  None  Hair  Reviewed  Eyes  Reviewed  Mouth  Reviewed  Skin  Reviewed  Nails  Reviewed     Diet Order:   Diet Order            Diet Heart Room service appropriate? Yes; Fluid consistency: Thin  Diet effective now              EDUCATION NEEDS:   Education needs have been addressed  Skin:  Skin Assessment: Reviewed RN Assessment  Last BM:  PTA  Height:   Ht Readings from Last 1 Encounters:  08/19/18 5\' 8"  (1.727 m)    Weight:   Wt Readings from Last 1 Encounters:  08/22/18 78.4 kg    Ideal Body Weight:  70 kg  BMI:  Body mass index is 26.28 kg/m.  Estimated Nutritional Needs:   Kcal:  2200-2400 kcal  Protein:  110-125 grams  Fluid:  >/= 2.2 L/day   Mariana Single RD, LDN Clinical Nutrition Pager # - [phone]

## 2018-08-22 NOTE — Progress Notes (Addendum)
Referring Physician(s): Dr. Cordelia Poche  Supervising Physician: Sandi Mariscal  Patient Status:  [facility] - In-pt  Chief Complaint: Follow up right PCN exchange and placement of SP catheter  Subjective:  83 y/o M with PMH significant for CAD s/p CABG 2002, CHF, CKD III, anemia and urothelial carcinoma of the bladder with bilateral hydronephrosis and bilateral percutaneous nephrostomy tubes. Patient well known to IR, bilateral PCNs placed 11/2017 with several exchanges most recently left PCN was changed 8/20 by Dr. Kathlene Cote. Patient presented to [facility] ED on 8/22 with complaints of suprapubic pain, minimal urine output from left PCN and right PCN dislodgement. In ED foley or coude catheters could not be passed, patient had intractable pain with several doses of fentanyl. He was admitted for further evaluation - urology was consulted who advised not trying for further uretheral catheterization due to the type of cancer patient has. IR was consulted for replacement of right PCN and placement of suprapubic cathter at the same time was requested Dr. Matilde Sprang - both procedures performed by Dr. Annamaria Boots on 8/23.  Patient reports he feels ok, wife states he was complaining of back pain earlier and when he woke up he was very confused and tried to pull at his tubing. She states that he pulls at tubing often and this is typically how his drain become dislodged. They are concerned for infection at insertion sites of PCNs because they have been infected before.   Allergies: Statins  Medications: Prior to Admission medications   Medication Sig Start Date End Date Taking? Authorizing Provider  acetaminophen (TYLENOL) 500 MG tablet Take 1,000 mg by mouth every 8 (eight) hours as needed for mild pain or moderate pain.   Yes [provider]  carvedilol (COREG) 3.125 MG tablet Take 1 tablet (3.125 mg total) by mouth 2 (two) times daily. 07/04/18 08/19/18 Yes Donne Hazel, MD  DULoxetine (CYMBALTA) 20 MG  capsule Take 1 capsule (20 mg total) by mouth 2 (two) times daily. Take once daily for 2 weeks then increase to twice daily Patient taking differently: Take 20 mg by mouth See admin instructions. Take 20 mg by mouth once daily for 2 weeks then increase to twice daily 08/12/18  Yes Tanner, Lyndon Code., PA-C  feeding supplement (BOOST HIGH PROTEIN) LIQD Take 1 Container by mouth 2 (two) times daily between meals.   Yes [provider]  furosemide (LASIX) 40 MG tablet Take 1 tablet (40 mg total) by mouth 2 (two) times daily. Patient taking differently: Take 40 mg by mouth as needed for fluid or edema.  05/13/18  Yes End, Harrell Gave, MD  hydrOXYzine (ATARAX/VISTARIL) 25 MG tablet Take 1 tablet (25 mg total) by mouth every 4 (four) hours as needed for itching. 07/21/18  Yes [name], MD  lidocaine-prilocaine (EMLA) cream Apply 1 application topically as needed. Patient taking differently: Apply 1 application topically as needed (port access).  12/15/17  Yes Shadad, Mathis Dad, MD  LINZESS 290 MCG CAPS capsule TAKE 1 CAP BY MOUTH ONCE DAILY 30 MIN PRIOR TO A MEAL. MUST HAVE OFFICE VISIT FOR FURTHER REFILLS Patient taking differently: Take 290 mcg by mouth daily.  05/12/18  Yes Pyrtle, Lajuan Lines, MD  megestrol (MEGACE) 40 MG/ML suspension TAKE 10ML BY MOUTH TWICE A DAY Patient taking differently: Take 400 mg by mouth 2 (two) times daily as needed (weight gain).  05/13/18  Yes Wyatt Portela, MD  Melatonin 3 MG CAPS Take 6 mg by mouth at bedtime as needed (  sleep).   Yes [provider]  mirabegron ER (MYRBETRIQ) 25 MG TB24 tablet Take 1 tablet (25 mg total) by mouth daily. 02/04/18  Yes Short, Noah Delaine, MD  nitroGLYCERIN (NITROSTAT) 0.4 MG SL tablet Place 1 tablet (0.4 mg total) under the tongue every 5 (five) minutes as needed. 08/19/17 08/19/18 Yes Dunn, Dayna N, PA-C  oxyCODONE (OXYCONTIN) 10 mg 12 hr tablet Take 1 tablet (10 mg total) by mouth 2 (two) times daily. Patient taking differently: Take  10 mg by mouth at bedtime.  07/22/18  Yes Wyatt Portela, MD  oxyCODONE-acetaminophen (PERCOCET) 10-325 MG tablet Take 1 tablet by mouth every 4 (four) hours as needed for pain. 08/12/18  Yes Tanner, Lyndon Code., PA-C  pembrolizumab [facility]) 100 MG/4ML SOLN Given at infusion center every 3 weeks   Yes [provider]  pramipexole (MIRAPEX) 1 MG tablet TAKE 1 AND 1/2 TABLETS BY MOUTH AT BEDTIME Patient taking differently: Take 1.5 mg by mouth every evening.  06/29/18  Yes [name], MD  predniSONE (DELTASONE) 5 MG tablet Take 1 tablet (5 mg total) by mouth daily with breakfast. 08/12/18  Yes Tanner, Lyndon Code., PA-C  ranitidine (ZANTAC) 300 MG tablet TAKE 1 TABLET BY MOUTH TWICE A DAY Patient taking differently: Take 300 mg by mouth 2 (two) times daily.  07/11/18  Yes Irene Shipper, MD  Tetrahydroz-Glyc-Hyprom-PEG (VISINE MAXIMUM REDNESS RELIEF OP) Place 2 drops into both eyes 2 (two) times daily as needed (dry eyes).    Yes [provider]  triamcinolone cream (KENALOG) 0.1 % Apply topically 3 (three) times daily. To affected areas 07/04/18  Yes Donne Hazel, MD  guaiFENesin-codeine 100-10 MG/5ML syrup Take 5 mLs by mouth every 6 (six) hours as needed for cough. Patient not taking: Reported on 08/19/2018 06/13/18   Harle Stanford., PA-C  prochlorperazine (COMPAZINE) 10 MG tablet Take 1 tablet (10 mg total) by mouth every 6 (six) hours as needed for nausea or vomiting. Patient not taking: Reported on 08/15/2018 12/15/17   Wyatt Portela, MD  QUEtiapine (SEROQUEL) 25 MG tablet TAKE 1 TABLET BY MOUTH EVERYDAY AT BEDTIME Patient not taking: No sig reported 08/03/18   [name], MD     Vital Signs: BP 124/74 (BP Location: Left Arm)   Pulse 75   Temp 98.4 F (36.9 C) (Oral)   Resp 16   Ht 5\' 8"  (1.727 m)   Wt 172 lb 13.5 oz (78.4 kg)   SpO2 100%   BMI 26.28 kg/m   Physical Exam  Constitutional: No distress.  HENT:  Head: Normocephalic.  Cardiovascular: Normal rate, regular  rhythm and normal heart sounds.  Pulmonary/Chest: Effort normal and breath sounds normal.  Abdominal: Soft. There is tenderness (near SP cath insertion site).  Insertion site of PCNs unremarkable - no evidence of infection, tubing in place appropriately; 50 cc clear yellow urine in right gravity bag, 100 cc clear yellow urine in left gravity bag. SP insertion site also unremarkable but with minimal pain on palpation.   Neurological: He is alert.  Skin: Skin is warm and dry. He is not diaphoretic.  Psychiatric: He has a normal mood and affect. His behavior is normal.    Imaging: Ir Fluoro Guide Ndl Plmt / Bx  Result Date: 08/19/2018 INDICATION: Hydronephrosis, accidental removal of the right nephrostomy catheter, urinary retention EXAM: Fluoroscopic right nephrostomy tube replacement through the existing tract Ultrasound and fluoroscopic suprapubic catheter insertion Date:  08/19/2018 08/19/2018 1:39 pm Radiologist:  Jerilynn Mages.  Daryll Brod, MD Guidance:  Ultrasound and fluoroscopy COMPARISON:  04/11/2018 FLUOROSCOPY TIME:  Two minutes 36 seconds (68 mGy). COMPLICATIONS: None immediate. MEDICATIONS: 1% lidocaine local ANESTHESIA/SEDATION: A total of Versed 2.0 mg and Fentanyl 100 mcg was administered intravenously. Moderate Sedation Time: 30 minutes. The patient's level of consciousness and vital signs were monitored continuously by radiology nursing throughout the procedure under my direct supervision. CONTRAST:  20 cc PROCEDURE: Informed written consent was obtained from the patient after a thorough discussion of the procedural risks, benefits and alternatives. All questions were addressed. Maximal Sterile Barrier Technique was utilized including caps, mask, sterile gowns, sterile gloves, sterile drape, hand hygiene and skin antiseptic. A timeout was performed prior to the initiation of the procedure. Right nephrostomy replacement: Under sterile conditions and local anesthesia, the existing right nephrostomy  percutaneous tract was cannulated with a 5 French catheter. Contrast injection demonstrates the small patent percutaneous tract. There is also surrounding retroperitoneal extravasation noted. Eventually the glidewire and catheter were advanced into the collecting system and ureter. Contrast injection confirms position within the collecting system. Amplatz guidewire inserted followed by a 10 French nephrostomy with the retention loop formed the renal pelvis. Position confirmed with fluoroscopy and contrast injection. Images obtained for documentation. Catheter secured with a Prolene suture and a sterile dressing. Gravity drainage bag connected. No immediate complication. Patient tolerated the procedure well. Suprapubic catheter insertion: Patient was repositioned supine. Preliminary ultrasound performed. Distended bladder localized in the midline. Images obtained for documentation. Under sterile conditions and local anesthesia, an 18 gauge introducer needle was advanced under direct ultrasound from a midline anterior approach into the bladder. There was return of urine. Amplatz guidewire inserted followed by tract dilatation to insert a 14 French catheter. Catheter position confirmed within the bladder with ultrasound and fluoroscopy. Images obtained for documentation. Catheter secured with Prolene suture and connected to external gravity drainage bag. Sterile dressing applied. No immediate complication. Patient tolerated the procedure well. IMPRESSION: Successful right nephrostomy replacement through the existing percutaneous tract. Successful fluoroscopic and ultrasound placement of a new suprapubic catheter (14 [place]). Electronically Signed   By: Jerilynn Mages.  Shick M.D.   On: 08/19/2018 14:15   Ir Nephrostomy Exchange Right  Result Date: 08/19/2018 INDICATION: Hydronephrosis, accidental removal of the right nephrostomy catheter, urinary retention EXAM: Fluoroscopic right nephrostomy tube replacement through the  existing tract Ultrasound and fluoroscopic suprapubic catheter insertion Date:  08/19/2018 08/19/2018 1:39 pm Radiologist:  Jerilynn Mages. Daryll Brod, MD Guidance:  Ultrasound and fluoroscopy COMPARISON:  04/11/2018 FLUOROSCOPY TIME:  Two minutes 36 seconds (68 mGy). COMPLICATIONS: None immediate. MEDICATIONS: 1% lidocaine local ANESTHESIA/SEDATION: A total of Versed 2.0 mg and Fentanyl 100 mcg was administered intravenously. Moderate Sedation Time: 30 minutes. The patient's level of consciousness and vital signs were monitored continuously by radiology nursing throughout the procedure under my direct supervision. CONTRAST:  20 cc PROCEDURE: Informed written consent was obtained from the patient after a thorough discussion of the procedural risks, benefits and alternatives. All questions were addressed. Maximal Sterile Barrier Technique was utilized including caps, mask, sterile gowns, sterile gloves, sterile drape, hand hygiene and skin antiseptic. A timeout was performed prior to the initiation of the procedure. Right nephrostomy replacement: Under sterile conditions and local anesthesia, the existing right nephrostomy percutaneous tract was cannulated with a 5 French catheter. Contrast injection demonstrates the small patent percutaneous tract. There is also surrounding retroperitoneal extravasation noted. Eventually the glidewire and catheter were advanced into the collecting system and ureter. Contrast injection confirms position within the collecting  system. Amplatz guidewire inserted followed by a 10 French nephrostomy with the retention loop formed the renal pelvis. Position confirmed with fluoroscopy and contrast injection. Images obtained for documentation. Catheter secured with a Prolene suture and a sterile dressing. Gravity drainage bag connected. No immediate complication. Patient tolerated the procedure well. Suprapubic catheter insertion: Patient was repositioned supine. Preliminary ultrasound performed.  Distended bladder localized in the midline. Images obtained for documentation. Under sterile conditions and local anesthesia, an 18 gauge introducer needle was advanced under direct ultrasound from a midline anterior approach into the bladder. There was return of urine. Amplatz guidewire inserted followed by tract dilatation to insert a 14 French catheter. Catheter position confirmed within the bladder with ultrasound and fluoroscopy. Images obtained for documentation. Catheter secured with Prolene suture and connected to external gravity drainage bag. Sterile dressing applied. No immediate complication. Patient tolerated the procedure well. IMPRESSION: Successful right nephrostomy replacement through the existing percutaneous tract. Successful fluoroscopic and ultrasound placement of a new suprapubic catheter (14 [place]). Electronically Signed   By: Jerilynn Mages.  Shick M.D.   On: 08/19/2018 14:15    Labs:  CBC: Recent Labs    08/18/18 2331 08/19/18 0813 08/20/18 0335 08/21/18 1019  WBC 10.8* 11.3* 8.2 5.7  HGB 10.7* 10.5* 9.8* 9.7*  HCT 30.4* 30.5* 28.6* 28.5*  PLT 55* 62* 47* 53*    COAGS: Recent Labs    12/03/17 0550 12/27/17 1227 07/04/18 0346 08/19/18 0813  INR 1.22 1.13 1.22 1.23  APTT  --  33  --  36    BMP: Recent Labs    08/18/18 2331 08/19/18 0813 08/20/18 0335 08/21/18 1019  NA 128* 134* 137 134*  K 3.8 4.0 4.2 3.7  CL 99 105 109 107  CO2 18* 17* 19* 21*  GLUCOSE 126* 136* 117* 126*  BUN 30* 32* 29* 23  CALCIUM 9.3 9.3 9.0 9.3  CREATININE 1.77* 1.77* 1.65* 1.25*  GFRNONAA 34* 34* 37* 52*  GFRAA 40* 40* 43* >60    LIVER FUNCTION TESTS: Recent Labs    06/30/18 2009 07/01/18 0448 07/22/18 1226 08/12/18 1239  BILITOT 0.4 0.9 0.3 0.4  AST 15 16 16  14*  ALT 15 13 12 12   ALKPHOS 48 42 57 61  PROT 6.6 6.0* 7.1 7.2  ALBUMIN 3.3* 3.0* 3.9 3.8    Assessment and Plan:  Bilateral PCNs due to chronic bilateral hydronephrosis in setting of urothelial carcinoma of  the bladder. Patient well known to IR with original bilateral PCN placements 11/2017 with several exchanges due to dislodgement or infection - per wife patient becomes disoriented and agitated and will pull at tubing causing it to dislodge. Left PCN was replaced on 8/20 by Dr. Kathlene Cote - draining appropriately, 100 cc in gravity bag on exam, 250 cc total reported yesterday. Right PCN with 50 cc in gravity bag, 800 cc total yesterday. SP with 875 cc total output yesterday.  Abdominal pain has improved per wife, she is concerned for PCN insertion site infection - none noted on exam today, no erythema, edema, discharge or pain on palpation of either insertion site. Some pain with palpation of SP catheter insertion site although no indication of infection. Patient afebrile, WBC has normalized since admission, creatinine is improving.  All drains working appropriately currently, request from urology to replace left nephrostomy and convert right nephrostomy to nephroureteral stent - this was discussed with Dr. Pascal Lux today who is agreeable; plan to proceed with above tomorrow. IR will continue to follow. D/c plans per admitting  service.  Electronically Signed: Joaquim Nam, PA-C 08/22/2018, 9:42 AM   I spent a total of 15 Minutes at the the patient's bedside AND on the patient's hospital floor or unit, greater than 50% of which was counseling/coordinating care for bilateral nephrostomy tubes, suprapubic catheter.

## 2018-08-22 NOTE — Telephone Encounter (Signed)
Spoke with lennie, let her know that dr [name] will see how patient does and if he is still ill, he will hold next treatment.

## 2018-08-22 NOTE — Progress Notes (Signed)
Subjective: Charles Coffey is improving following replacement of the dislodged nephrostomy tube and placement of an SP tube.  He is afebrile with a WBC of 5.7 and a Cr of 1.25.    ROS:  Review of Systems  Constitutional: Negative for fever.  Gastrointestinal: Negative for abdominal pain.    Anti-infectives: Anti-infectives (From admission, onward)   Start     Dose/Rate Route Frequency Ordered Stop   08/21/18 1300  doxycycline (VIBRA-TABS) tablet 100 mg     100 mg Oral Every 12 hours 08/21/18 1210     08/20/18 1046  ceFEPIme (MAXIPIME) 2 g in sodium chloride 0.9 % 100 mL IVPB  Status:  Discontinued     2 g 200 mL/hr over 30 Minutes Intravenous Every 24 hours 08/19/18 1046 08/21/18 1211   08/20/18 0800  vancomycin (VANCOCIN) IVPB 750 mg/150 ml premix  Status:  Discontinued     750 mg 150 mL/hr over 60 Minutes Intravenous Every 24 hours 08/19/18 0358 08/21/18 1210   08/19/18 1000  ceFEPIme (MAXIPIME) 2 g in sodium chloride 0.9 % 100 mL IVPB  Status:  Discontinued     2 g 200 mL/hr over 30 Minutes Intravenous Every 12 hours 08/19/18 0358 08/19/18 1046   08/19/18 0400  vancomycin (VANCOCIN) 1,500 mg in sodium chloride 0.9 % 500 mL IVPB     1,500 mg 250 mL/hr over 120 Minutes Intravenous  Once 08/19/18 0355 08/19/18 0633   08/19/18 0134  ceFEPIme (MAXIPIME) 1 g injection    Note to Pharmacy:  Jaci Carrel   : cabinet override      08/19/18 0134 08/19/18 0148   08/19/18 0115  ceFEPIme (MAXIPIME) 1 g in sodium chloride 0.9 % 100 mL IVPB     1 g 200 mL/hr over 30 Minutes Intravenous  Once 08/19/18 0106 08/19/18 0219   08/19/18 0100  vancomycin (VANCOCIN) IVPB 1000 mg/200 mL premix  Status:  Discontinued     1,000 mg 200 mL/hr over 60 Minutes Intravenous  Once 08/19/18 0045 08/19/18 0355   08/19/18 0000  cefTRIAXone (ROCEPHIN) 1 g in sodium chloride 0.9 % 100 mL IVPB     1 g 200 mL/hr over 30 Minutes Intravenous  Once 08/18/18 2358 08/19/18 0136      Current Facility-Administered  Medications  Medication Dose Route Frequency Provider Last Rate Last Dose  . 0.9 %  sodium chloride infusion   Intravenous PRN Mariel Aloe, MD   Stopped at 08/19/18 [phone]  . 0.9 %  sodium chloride infusion   Intravenous PRN Mariel Aloe, MD   Stopped at 08/21/18 1337  . acetaminophen (TYLENOL) tablet 650 mg  650 mg Oral Q6H PRN Mariel Aloe, MD   650 mg at 08/20/18 0945   Or  . acetaminophen (TYLENOL) suppository 650 mg  650 mg Rectal Q6H PRN Mariel Aloe, MD      . alum & mag hydroxide-simeth (MAALOX/MYLANTA) 200-200-20 MG/5ML suspension 30 mL  30 mL Oral Q4H PRN Mariel Aloe, MD   30 mL at 08/21/18 0558  . carvedilol (COREG) tablet 3.125 mg  3.125 mg Oral BID WC Mariel Aloe, MD   3.125 mg at 08/21/18 1756  . Chlorhexidine Gluconate Cloth 2 % PADS 6 each  6 each Topical Q0600 Mariel Aloe, MD   6 each at 08/22/18 0447  . Chlorhexidine Gluconate Cloth 2 % PADS 6 each  6 each Topical Daily Mariel Aloe, MD   6 each at 08/21/18 1313  .  doxycycline (VIBRA-TABS) tablet 100 mg  100 mg Oral Q12H Mariel Aloe, MD   100 mg at 08/21/18 2111  . DULoxetine (CYMBALTA) DR capsule 20 mg  20 mg Oral Daily Mariel Aloe, MD   20 mg at 08/21/18 1638  . famotidine (PEPCID) IVPB 20 mg premix  20 mg Intravenous Q12H Mariel Aloe, MD   Stopped at 08/21/18 2147  . feeding supplement (ENSURE ENLIVE) (ENSURE ENLIVE) liquid 237 mL  237 mL Oral BID BM Mariel Aloe, MD      . furosemide (LASIX) tablet 40 mg  40 mg Oral BID Mariel Aloe, MD   40 mg at 08/21/18 1756  . guaiFENesin-codeine 100-10 MG/5ML solution 5 mL  5 mL Oral Q6H PRN Mariel Aloe, MD      . hydrOXYzine (ATARAX/VISTARIL) tablet 25 mg  25 mg Oral Q4H PRN Mariel Aloe, MD   25 mg at 08/21/18 2111  . lidocaine-prilocaine (EMLA) cream 1 application  1 application Topical PRN Mariel Aloe, MD   1 application at 46/65/99 1000  . linaclotide (LINZESS) capsule 290 mcg  290 mcg Oral Daily Mariel Aloe, MD      .  LORazepam (ATIVAN) injection 0.5-1 mg  0.5-1 mg Intravenous Q4H PRN Mariel Aloe, MD   1 mg at 08/19/18 0917  . megestrol (MEGACE) 40 MG/ML suspension 400 mg  400 mg Oral BID Mariel Aloe, MD   400 mg at 08/21/18 2111  . Melatonin TABS 6 mg  6 mg Oral QHS PRN Mariel Aloe, MD      . methocarbamol (ROBAXIN) injection 1,000 mg  1,000 mg Intramuscular Once PRN Mariel Aloe, MD      . mupirocin ointment (BACTROBAN) 2 % 1 application  1 application Nasal BID Mariel Aloe, MD   1 application at 35/70/17 2111  . ondansetron (ZOFRAN) tablet 4 mg  4 mg Oral Q6H PRN Mariel Aloe, MD       Or  . ondansetron [facility]) injection 4 mg  4 mg Intravenous Q6H PRN Mariel Aloe, MD   4 mg at 08/20/18 1302  . oxyCODONE-acetaminophen (PERCOCET/ROXICET) 5-325 MG per tablet 1 tablet  1 tablet Oral Q6H PRN Mariel Aloe, MD       And  . oxyCODONE (Oxy IR/ROXICODONE) immediate release tablet 5 mg  5 mg Oral Q6H PRN Mariel Aloe, MD   5 mg at 08/20/18 1215  . oxyCODONE (OXYCONTIN) 12 hr tablet 10 mg  10 mg Oral QHS Mariel Aloe, MD   10 mg at 08/21/18 2111  . polyvinyl alcohol (LIQUIFILM TEARS) 1.4 % ophthalmic solution   Both Eyes BID PRN Mariel Aloe, MD      . pramipexole (MIRAPEX) tablet 1.5 mg  1.5 mg Oral QPM Mariel Aloe, MD   1.5 mg at 08/21/18 1856  . predniSONE (DELTASONE) tablet 5 mg  5 mg Oral Q breakfast Mariel Aloe, MD   5 mg at 08/21/18 0817  . sodium chloride flush (NS) 0.9 % injection 10-40 mL  10-40 mL Intracatheter PRN Mariel Aloe, MD      . sodium chloride flush (NS) 0.9 % injection 3 mL  3 mL Intravenous Q12H Mariel Aloe, MD   3 mL at 08/21/18 1000     Objective: Vital signs in last 24 hours: Temp:  [97.3 F (36.3 C)-98.5 F (36.9 C)] 98.4 F (36.9 C) (08/26 0446) Pulse Rate:  [63-79]  75 (08/26 0446) Resp:  [0-24] 16 (08/26 0446) BP: (92-130)/(63-74) 124/74 (08/26 0446) SpO2:  [96 %-100 %] 100 % (08/26 0446) Weight:  [78.4 kg] 78.4 kg (08/26  0447)  Intake/Output from previous day: 08/25 0701 - 08/26 0700 In: 483.4 [I.V.:132.9; IV Piggyback:350.5] Out: 1925 [Urine:1925] Intake/Output this shift: No intake/output data recorded.   Physical Exam  Constitutional: He appears well-developed and well-nourished.  Abdominal: Soft. There is no tenderness.  SP tube dressing dry.   Vitals reviewed.   Lab Results:  Recent Labs    08/20/18 0335 08/21/18 1019  WBC 8.2 5.7  HGB 9.8* 9.7*  HCT 28.6* 28.5*  PLT 47* 53*   BMET Recent Labs    08/20/18 0335 08/21/18 1019  NA 137 134*  K 4.2 3.7  CL 109 107  CO2 19* 21*  GLUCOSE 117* 126*  BUN 29* 23  CREATININE 1.65* 1.25*  CALCIUM 9.0 9.3   PT/INR Recent Labs    08/19/18 0813  LABPROT 15.4*  INR 1.23   ABG No results for input(s): PHART, HCO3 in the last 72 hours.  Invalid input(s): PCO2, PO2  Studies/Results: No results found.   Assessment and Plan: Urothelial neoplasm with bilateral ureteral and bladder outlet obstruction admitted in retention with a dislodged nephrostomy tube and sepsis.  He is now improving with NT replacement and SP tube insertion after failed attempt at foley placement.  He will need one of the nephrostomy catheters converted back to a nephroureteral catheter that will be able to provide both bladder and renal drainage and hopefully we can get the SP tube out to reduce his tube count.  He needs 7-10 days with the SP tube to allow the tract to mature first.         LOS: 3 days    Irine Seal 08/22/2018 [phone] ID: Charles Coffey, male   DOB: 1935/11/19, 83 y.o.   MRN: [id number]

## 2018-08-22 NOTE — Progress Notes (Signed)
PROGRESS NOTE    Charles Coffey  QQV:[id number] DOB: Oct 27, 1935 DOA: 08/18/2018 PCP: [name], MD   Brief Narrative: Charles Coffey is a 83 y.o. male with medical history significant for coronary artery disease status post CABG in 2002, chronic combined systolic & diastolic CHF, chronic kidney disease stage III, chronic anemia and thrombocytopenia, chronic back pain, and urothelial carcinoma of the bladder with bilateral hydronephrosis and percutaneous nephrostomy tubes. She presented with suprapubic and back pain in addition to dislodged right nephrostomy tube and also found to meet sepsis criteria.   Assessment & Plan:   Principal Problem:   Sepsis due to urinary tract infection ([place]) Active Problems:   Essential hypertension   Coronary artery disease of native heart with stable angina pectoris (HCC)   Chronic low back pain   Anemia   Chronic kidney disease, stage 3 (HCC)   Hydronephrosis due to obstructive malignant neoplasm of bladder (HCC)   Urothelial carcinoma of bladder (HCC)   Thrombocytopenia (HCC)   Chronic systolic heart failure (HCC)   Hyponatremia   Sepsis Secondary to UTI in setting of acute urinary retention and dislodged nephrostomy tube. Urine culture significant for staph aureus species. -Continue Doxycycline (transitioned from Vancomycin) -Discussed with ID; recommendation for replacement of left nephrostomy tube. Discussed with IR.  Suprapubic/flank pain In setting of urinary retention and UTI. S/p suprapubic catheter. Improved.  Chronic combined systolic and diastolic heart failure Hypovolemic on admission. Given IV fluids. Last EF of 25-30%. Stable.  Urothelial carcinoma of the bladder Bilateral nephrostomy tubes Both tubes replaced recently. Right tube replaced this admission.  CKD stage III Baseline of 1.7. Below baseline.  Anemia Baseline of 8. Elevated in setting of hypovolemia. Trended down slightly. Above baseline and  stable.  Thrombocytopenia Chronic. Slightly down in setting of acute infection. Stable.  Hyponatremia Transient. In setting of hypovolemia. Mild.  Chronic pain -Continue Oxycontin and oxycodone  CAD -Continue Coreg  Delirium This is a chronic issue. Worsened in the hospital. Also worsened in setting of infection. Vistaril likely contributing. Improved.  Diminished heart sounds Limited Transthoracic Echocardiogram significant for no pericardial effusion.   DVT prophylaxis: SCDs Code Status:   Code Status: DNR Family Communication: Wife at bedside Disposition Plan: Discharge pending IR recommendations for nephrostomy tube   Consultants:   Urology  Interventional radiology  Procedures:   None  Antimicrobials:  Vancomycin  Cefepime    Subjective: Was doing well until this morning where he's been a little confused.  Objective: Vitals:   08/21/18 1358 08/21/18 2104 08/22/18 0446 08/22/18 0447  BP: 101/63 92/64 124/74   Pulse: 71 63 75   Resp: 18 (!) 24 16   Temp: 98 F (36.7 C) 98.2 F (36.8 C) 98.4 F (36.9 C)   TempSrc: Oral Oral Oral   SpO2: 98% 97% 100%   Weight:    78.4 kg  Height:        Intake/Output Summary (Last 24 hours) at 08/22/2018 1319 Last data filed at 08/22/2018 0845 Gross per 24 hour  Intake 176.32 ml  Output 1650 ml  Net -1473.68 ml   Filed Weights   08/19/18 0300 08/20/18 0457 08/22/18 0447  Weight: 80.2 kg 79.8 kg 78.4 kg    Examination:  General exam: Appears calm and comfortable Respiratory system: Clear to auscultation. Respiratory effort normal. Cardiovascular system: S1 & S2 heard, RRR. No murmurs, rubs, gallops or clicks. Gastrointestinal system: Abdomen is nondistended, soft and nontender. No organomegaly or masses felt. Normal bowel sounds heard.  Central nervous system: Alert and oriented to person, place and time. No focal neurological deficits. Extremities: No edema. No calf tenderness Skin: No cyanosis. No  rashes Psychiatry: Judgement and insight appear normal. Mood & affect appropriate.   Data Reviewed: I have personally reviewed following labs and imaging studies  CBC: Recent Labs  Lab 08/18/18 2331 08/19/18 0813 08/20/18 0335 08/21/18 1019  WBC 10.8* 11.3* 8.2 5.7  NEUTROABS 8.4* 9.8*  --   --   HGB 10.7* 10.5* 9.8* 9.7*  HCT 30.4* 30.5* 28.6* 28.5*  MCV 92.4 92.1 94.4 93.4  PLT 55* 62* 47* 53*   Basic Metabolic Panel: Recent Labs  Lab 08/18/18 2331 08/19/18 0813 08/20/18 0335 08/21/18 1019  NA 128* 134* 137 134*  K 3.8 4.0 4.2 3.7  CL 99 105 109 107  CO2 18* 17* 19* 21*  GLUCOSE 126* 136* 117* 126*  BUN 30* 32* 29* 23  CREATININE 1.77* 1.77* 1.65* 1.25*  CALCIUM 9.3 9.3 9.0 9.3   GFR: Estimated Creatinine Clearance: 44.8 mL/min (A) (by C-G formula based on SCr of 1.25 mg/dL (H)). Liver Function Tests: No results for input(s): AST, ALT, ALKPHOS, BILITOT, PROT, ALBUMIN in the last 168 hours. No results for input(s): LIPASE, AMYLASE in the last 168 hours. No results for input(s): AMMONIA in the last 168 hours. Coagulation Profile: Recent Labs  Lab 08/19/18 0813  INR 1.23   Cardiac Enzymes: No results for input(s): CKTOTAL, CKMB, CKMBINDEX, TROPONINI in the last 168 hours. BNP (last 3 results) No results for input(s): PROBNP in the last 8760 hours. HbA1C: No results for input(s): HGBA1C in the last 72 hours. CBG: No results for input(s): GLUCAP in the last 168 hours. Lipid Profile: No results for input(s): CHOL, HDL, LDLCALC, TRIG, CHOLHDL, LDLDIRECT in the last 72 hours. Thyroid Function Tests: No results for input(s): TSH, T4TOTAL, FREET4, T3FREE, THYROIDAB in the last 72 hours. Anemia Panel: No results for input(s): VITAMINB12, FOLATE, FERRITIN, TIBC, IRON, RETICCTPCT in the last 72 hours. Sepsis Labs: Recent Labs  Lab 08/19/18 0813  LATICACIDVEN 0.8    Recent Results (from the past 240 hour(s))  Urine culture     Status: Abnormal   Collection  Time: 08/19/18 12:05 AM  Result Value Ref Range Status   Specimen Description   Final    URINE, RANDOM NEPHROSTOMY BAG LEFT KIDNEY Performed at [facility], [address] [place], [place] [address]    Special Requests   Final    NONE Performed at [facility], [address] [place], [place] [address]    Culture (A)  Final    >=100,000 COLONIES/mL METHICILLIN RESISTANT STAPHYLOCOCCUS AUREUS   Report Status 08/21/2018 FINAL  Final   Organism ID, Bacteria METHICILLIN RESISTANT STAPHYLOCOCCUS AUREUS (A)  Final      Susceptibility   Methicillin resistant staphylococcus aureus - MIC*    CIPROFLOXACIN >=8 RESISTANT Resistant     GENTAMICIN <=0.5 SENSITIVE Sensitive     NITROFURANTOIN <=16 SENSITIVE Sensitive     OXACILLIN >=4 RESISTANT Resistant     TETRACYCLINE <=1 SENSITIVE Sensitive     VANCOMYCIN <=0.5 SENSITIVE Sensitive     TRIMETH/SULFA <=10 SENSITIVE Sensitive     CLINDAMYCIN <=0.25 SENSITIVE Sensitive     RIFAMPIN <=0.5 SENSITIVE Sensitive     Inducible Clindamycin NEGATIVE Sensitive     * >=100,000 COLONIES/mL METHICILLIN RESISTANT STAPHYLOCOCCUS AUREUS  Blood culture (routine x 2)     Status: None (Preliminary result)   Collection Time:  08/19/18 12:50 AM  Result Value Ref Range Status   Specimen Description   Final    BLOOD LEFT FOREARM Performed at [facility], [address] [place], [place] [address]    Special Requests   Final    BOTTLES DRAWN AEROBIC AND ANAEROBIC Blood Culture adequate volume Performed at [facility], [address] [place], [place] [address]    Culture   Final    NO GROWTH 3 DAYS Performed at [facility], [place] [address] [place], [place] [address]    Report Status PENDING  Incomplete  Blood culture (routine x 2)     Status: None (Preliminary result)   Collection Time: 08/19/18 12:50 AM  Result Value Ref Range Status   Specimen Description   Final    BLOOD RIGHT FOREARM Performed at  [facility], [address] [place], [place] [address]    Special Requests   Final    BOTTLES DRAWN AEROBIC AND ANAEROBIC Blood Culture adequate volume Performed at [facility], [address] [place], [place] [address]    Culture   Final    NO GROWTH 3 DAYS Performed at [facility], [place] [address] [place], [place] [address]    Report Status PENDING  Incomplete  MRSA PCR Screening     Status: Abnormal   Collection Time: 08/19/18  2:57 AM  Result Value Ref Range Status   MRSA by PCR POSITIVE (A) NEGATIVE Final    Comment:        The GeneXpert MRSA Assay (FDA approved for NASAL specimens only), is one component of a comprehensive MRSA colonization surveillance program. It is not intended to diagnose MRSA infection nor to guide or monitor treatment for MRSA infections. RESULT CALLED TO, READ BACK BY AND VERIFIED WITH: Alcide Clever [id number] @ [address] [place] Performed at [facility] [address] [place], [place] [address]          Radiology Studies: No results found.      Scheduled Meds: . carvedilol  3.125 mg Oral BID WC  . Chlorhexidine Gluconate Cloth  6 each Topical Q0600  . Chlorhexidine Gluconate Cloth  6 each Topical Daily  . doxycycline  100 mg Oral Q12H  . DULoxetine  20 mg Oral Daily  . famotidine  20 mg Oral BID  . furosemide  40 mg Oral BID  . lactose free nutrition  237 mL Oral TID WC  . linaclotide  290 mcg Oral Daily  . megestrol  400 mg Oral BID  . mupirocin ointment  1 application Nasal BID  . oxyCODONE  10 mg Oral QHS  . pramipexole  1.5 mg Oral QPM  . predniSONE  5 mg Oral Q breakfast  . sodium chloride flush  3 mL Intravenous Q12H   Continuous Infusions: . sodium chloride Stopped (08/19/18 0616)  . sodium chloride Stopped (08/21/18 1337)     LOS: 3 days     Cordelia Poche, MD [facility] 08/22/2018, 1:19 PM Pager: [phone]  If 7PM-7AM, please contact  night-coverage [email or web address] 08/22/2018, 1:19 PM

## 2018-08-22 NOTE — Telephone Encounter (Signed)
Wife lennie calling. States patient is in [facility]. Was just transferred from ICU. Was questioning whether patient would still receive keytruda 08/26/18?

## 2018-08-22 NOTE — Care Management Important Message (Addendum)
Important Message  Patient Details IM Letter given to Kathy/Case Manager to present to the Patient Name: BEREN YNIGUEZ MRN: [id number] Date of Birth: 07-07-1935   Medicare Important Message Given:  Yes    Kerin Salen 08/22/2018, 1:40 [place] Message  Patient Details  Name: KEYSHON STEIN MRN: [id number] Date of Birth: 1935-12-23   Medicare Important Message Given:  Yes    Kerin Salen 08/22/2018, 1:39 PM

## 2018-08-22 NOTE — Telephone Encounter (Signed)
We will see how he does in the next few days. We will hold treatment if he is still ill.

## 2018-08-22 NOTE — Progress Notes (Signed)
[facility] Spiritual Care Note  Following [name] and wife [name] from [facility] side. Reached [name] on her cell because in the past hospitalizations have brought extra stress, causing strategic visiting to be more constructive than drop-ins.  [name] was exhausted and tearful. She is hopeful for Palliative Care consult for help with pain/sx mgmt and especially Goals of Care, which she has discussed with Alfredia Client, [facility], and who is referring her question to Dr [name] for f/u. GOC is becoming increasingly important to couple as they cope with repeated nephrostomy changes, infections, pt's sundowning confusion, etc. Per spouse, pt is more often oriented to past (and able to engage fully with family) than to present, esp at night.  Plan to f/u in person tomorrow for further support.   [place], [place], [facility] M-F daytime pager [phone] [facility] 24/7 pager [phone] Voicemail [phone]

## 2018-08-23 ENCOUNTER — Inpatient Hospital Stay (HOSPITAL_COMMUNITY): Payer: Medicare HMO

## 2018-08-23 ENCOUNTER — Encounter (HOSPITAL_COMMUNITY): Payer: Self-pay | Admitting: Interventional Radiology

## 2018-08-23 ENCOUNTER — Encounter: Payer: Self-pay | Admitting: General Practice

## 2018-08-23 DIAGNOSIS — Z515 Encounter for palliative care: Secondary | ICD-10-CM

## 2018-08-23 DIAGNOSIS — R451 Restlessness and agitation: Secondary | ICD-10-CM

## 2018-08-23 DIAGNOSIS — Z66 Do not resuscitate: Secondary | ICD-10-CM

## 2018-08-23 HISTORY — PX: IR NEPHROSTOMY EXCHANGE LEFT: IMG6069

## 2018-08-23 HISTORY — PX: IR CONVERT RIGHT NEPHROSTOMY TO NEPHROURETERAL CATH: IMG6068

## 2018-08-23 LAB — CBC WITH DIFFERENTIAL/PLATELET
BASOS ABS: 0 10*3/uL (ref 0.0–0.1)
Basophils Relative: 0 %
Eosinophils Absolute: 0.1 10*3/uL (ref 0.0–0.7)
Eosinophils Relative: 1 %
HEMATOCRIT: 28.7 % — AB (ref 39.0–52.0)
Hemoglobin: 9.8 g/dL — ABNORMAL LOW (ref 13.0–17.0)
LYMPHS PCT: 32 %
Lymphs Abs: 1.7 10*3/uL (ref 0.7–4.0)
MCH: 31.3 pg (ref 26.0–34.0)
MCHC: 34.1 g/dL (ref 30.0–36.0)
MCV: 91.7 fL (ref 78.0–100.0)
Monocytes Absolute: 0.2 10*3/uL (ref 0.1–1.0)
Monocytes Relative: 5 %
NEUTROS ABS: 3.3 10*3/uL (ref 1.7–7.7)
Neutrophils Relative %: 62 %
Platelets: 58 10*3/uL — ABNORMAL LOW (ref 150–400)
RBC: 3.13 MIL/uL — AB (ref 4.22–5.81)
RDW: 16.6 % — ABNORMAL HIGH (ref 11.5–15.5)
WBC: 5.3 10*3/uL (ref 4.0–10.5)

## 2018-08-23 LAB — BASIC METABOLIC PANEL
ANION GAP: 8 (ref 5–15)
BUN: 30 mg/dL — ABNORMAL HIGH (ref 8–23)
CHLORIDE: 106 mmol/L (ref 98–111)
CO2: 24 mmol/L (ref 22–32)
Calcium: 9.3 mg/dL (ref 8.9–10.3)
Creatinine, Ser: 1.35 mg/dL — ABNORMAL HIGH (ref 0.61–1.24)
GFR calc Af Amer: 55 mL/min — ABNORMAL LOW (ref >60)
GFR, EST NON AFRICAN AMERICAN: 48 mL/min — AB (ref >60)
Glucose, Bld: 108 mg/dL — ABNORMAL HIGH (ref 70–99)
POTASSIUM: 3.6 mmol/L (ref 3.5–5.1)
Sodium: 138 mmol/L (ref 135–145)

## 2018-08-23 MED ORDER — FENTANYL CITRATE (PF) 100 MCG/2ML IJ SOLN
INTRAMUSCULAR | Status: AC
  Filled 2018-08-23: qty 2

## 2018-08-23 MED ORDER — IOPAMIDOL (ISOVUE-300) INJECTION 61%
INTRAVENOUS | Status: AC
  Administered 2018-08-23: 10 mL
  Filled 2018-08-23: qty 50

## 2018-08-23 MED ORDER — DIPHENHYDRAMINE HCL 50 MG PO CAPS
50.0000 mg | ORAL_CAPSULE | Freq: Once | ORAL | Status: DC
  Filled 2018-08-23: qty 1

## 2018-08-23 MED ORDER — MIDAZOLAM HCL 2 MG/2ML IJ SOLN
INTRAMUSCULAR | Status: AC
  Filled 2018-08-23: qty 2

## 2018-08-23 MED ORDER — LIDOCAINE HCL 1 % IJ SOLN
INTRAMUSCULAR | Status: AC
  Filled 2018-08-23: qty 20

## 2018-08-23 MED ORDER — OLANZAPINE 5 MG PO TABS
5.0000 mg | ORAL_TABLET | Freq: Every day | ORAL | Status: DC
  Administered 2018-08-23: 5 mg via ORAL
  Filled 2018-08-23: qty 1

## 2018-08-23 MED ORDER — IOPAMIDOL (ISOVUE-300) INJECTION 61%
10.0000 mL | Freq: Once | INTRAVENOUS | Status: AC | PRN
  Administered 2018-08-23: 10 mL

## 2018-08-23 MED ORDER — FLUMAZENIL 0.5 MG/5ML IV SOLN
INTRAVENOUS | Status: AC
  Filled 2018-08-23: qty 5

## 2018-08-23 MED ORDER — FENTANYL CITRATE (PF) 100 MCG/2ML IJ SOLN
INTRAMUSCULAR | Status: AC | PRN
  Administered 2018-08-23 (×2): 50 ug via INTRAVENOUS

## 2018-08-23 MED ORDER — MIDAZOLAM HCL 2 MG/2ML IJ SOLN
INTRAMUSCULAR | Status: AC | PRN
  Administered 2018-08-23 (×2): 1 mg via INTRAVENOUS
  Administered 2018-08-23: 2 mg via INTRAVENOUS

## 2018-08-23 MED ORDER — MIDAZOLAM HCL 2 MG/2ML IJ SOLN
INTRAMUSCULAR | Status: AC
  Filled 2018-08-23: qty 4

## 2018-08-23 MED ORDER — NALOXONE HCL 0.4 MG/ML IJ SOLN
INTRAMUSCULAR | Status: AC
  Filled 2018-08-23: qty 1

## 2018-08-23 MED ORDER — LIDOCAINE HCL 1 % IJ SOLN
INTRAMUSCULAR | Status: AC | PRN
  Administered 2018-08-23: 10 mL

## 2018-08-23 MED ORDER — DIPHENHYDRAMINE HCL 25 MG PO CAPS: 25.0000 mg | ORAL_CAPSULE | ORAL | Status: DC | PRN

## 2018-08-23 MED ORDER — HYDROXYZINE HCL 10 MG PO TABS
10.0000 mg | ORAL_TABLET | Freq: Four times a day (QID) | ORAL | Status: DC | PRN
  Administered 2018-08-23 – 2018-08-24 (×3): 10 mg via ORAL
  Filled 2018-08-23 (×4): qty 1

## 2018-08-23 NOTE — Progress Notes (Signed)
PT Cancellation Note  Patient Details Name: Charles Coffey MRN: [id number] DOB: 23-May-1935   Cancelled Treatment:    Reason Eval/Treat Not Completed: Patient's level of consciousness.  Er RN, recent procedure and is /MEDICATED and unable to participate.Marland Kitchen CHECK BACK ANOTHER TIME.  [place] PT [phone]  [name] 08/23/2018, 2:49 PM

## 2018-08-23 NOTE — Progress Notes (Signed)
[facility] Spiritual Care Note  Reached wife Charles Coffey to check in by phone prior to planned meeting. Charles Coffey however described overnight conflict and stressors that make meeting too difficult today. Provided brief empathic listening, emotional support, normalization of feelings, and spiritual encouragement. We plan to connect by phone and possibly in person tomorrow instead.    [place], [place], [facility] M-F daytime pager [phone] [facility] 24/7 pager [phone] Voicemail [phone]

## 2018-08-23 NOTE — Progress Notes (Signed)
PROGRESS NOTE    Charles Coffey  CHE:[id number] DOB: 1935/08/13 DOA: 08/18/2018 PCP: [name], MD   Brief Narrative: Charles Coffey is a 83 y.o. male with medical history significant for coronary artery disease status post CABG in 2002, chronic combined systolic & diastolic CHF, chronic kidney disease stage III, chronic anemia and thrombocytopenia, chronic back pain, and urothelial carcinoma of the bladder with bilateral hydronephrosis and percutaneous nephrostomy tubes. She presented with suprapubic and back pain in addition to dislodged right nephrostomy tube and also found to meet sepsis criteria.   Assessment & Plan:   Principal Problem:   Sepsis due to urinary tract infection ([place]) Active Problems:   Essential hypertension   Coronary artery disease of native heart with stable angina pectoris (HCC)   Chronic low back pain   Anemia   Chronic kidney disease, stage 3 (HCC)   Hydronephrosis due to obstructive malignant neoplasm of bladder (HCC)   Urothelial carcinoma of bladder (HCC)   Thrombocytopenia (HCC)   Chronic systolic heart failure (HCC)   Hyponatremia   Sepsis Secondary to UTI in setting of acute urinary retention and dislodged nephrostomy tube. Urine culture significant for staph aureus species. -Continue Doxycycline (transitioned from Vancomycin) -Discussed with ID; recommendation for replacement of left nephrostomy tube. Discussed with IR. Plan for nephrostomy tube placement in addition to conversion of right PCN to nephroureteral catheter  Suprapubic/flank pain In setting of urinary retention and UTI. S/p suprapubic catheter. Improved.  Chronic combined systolic and diastolic heart failure Hypovolemic on admission. Given IV fluids. Last EF of 25-30%. Stable.  Urothelial carcinoma of the bladder Bilateral nephrostomy tubes Both tubes replaced recently. Right tube replaced this admission.  CKD stage III Baseline of 1.7. Below  baseline.  Anemia Baseline of 8. Elevated in setting of hypovolemia. Trended down slightly. Above baseline and stable.  Thrombocytopenia Chronic. Slightly down in setting of acute infection. Stable.  Hyponatremia Transient. In setting of hypovolemia. Mild.  Chronic pain -Continue Oxycontin and oxycodone prn  CAD -Continue Coreg  Delirium This is a chronic issue. Worsened in the hospital. Also worsened in setting of infection. Vistaril likely contributing initially decreased. -Restart Vistaril at lower dose of 10 mg -Start Zyprexa since patient does not tolerate Seroquel -Palliative care  Diminished heart sounds Limited Transthoracic Echocardiogram significant for no pericardial effusion. Resolved.   DVT prophylaxis: SCDs Code Status:   Code Status: DNR Family Communication: Wife at bedside Disposition Plan: Discharge pending IR recommendations for nephrostomy tube and improvement of delirium. PT consult placed. Palliative care recommendations pending.   Consultants:   Urology  Interventional radiology  Palliative care medicine  Procedures:   None  Antimicrobials:  Vancomycin  Cefepime    Subjective: Significant delirium overnight. Slightly improved this morning but still confused.  Objective: Vitals:   08/22/18 1429 08/22/18 1900 08/23/18 0350 08/23/18 0442  BP: (!) 104/57 110/66 110/72   Pulse: 76 75 70   Resp: 16 18 18    Temp: 98.1 F (36.7 C) 97.9 F (36.6 C) 98.2 F (36.8 C)   TempSrc: Oral Oral Oral   SpO2: 100% 100% 99%   Weight:    79.8 kg  Height:        Intake/Output Summary (Last 24 hours) at 08/23/2018 1144 Last data filed at 08/23/2018 1125 Gross per 24 hour  Intake 757.75 ml  Output 3425 ml  Net -2667.25 ml   Filed Weights   08/20/18 0457 08/22/18 0447 08/23/18 0442  Weight: 79.8 kg 78.4 kg 79.8 kg  Examination:  General exam: Appears calm and comfortable Respiratory system: Clear to auscultation. Respiratory effort  normal. Cardiovascular system: S1 & S2 heard, RRR. No murmurs, rubs, gallops or clicks. Gastrointestinal system: Abdomen is nondistended, soft and nontender. Normal bowel sounds heard. Central nervous system: Alert and oriented to person and place. No focal neurological deficits. Extremities: No edema. No calf tenderness Skin: No cyanosis. No rashes Psychiatry: Judgement and insight appear normal. Mood & affect appropriate.    Data Reviewed: I have personally reviewed following labs and imaging studies  CBC: Recent Labs  Lab 08/18/18 2331 08/19/18 0813 08/20/18 0335 08/21/18 1019 08/23/18 0403  WBC 10.8* 11.3* 8.2 5.7 5.3  NEUTROABS 8.4* 9.8*  --   --  3.3  HGB 10.7* 10.5* 9.8* 9.7* 9.8*  HCT 30.4* 30.5* 28.6* 28.5* 28.7*  MCV 92.4 92.1 94.4 93.4 91.7  PLT 55* 62* 47* 53* 58*   Basic Metabolic Panel: Recent Labs  Lab 08/18/18 2331 08/19/18 0813 08/20/18 0335 08/21/18 1019 08/23/18 0403  NA 128* 134* 137 134* 138  K 3.8 4.0 4.2 3.7 3.6  CL 99 105 109 107 106  CO2 18* 17* 19* 21* 24  GLUCOSE 126* 136* 117* 126* 108*  BUN 30* 32* 29* 23 30*  CREATININE 1.77* 1.77* 1.65* 1.25* 1.35*  CALCIUM 9.3 9.3 9.0 9.3 9.3   GFR: Estimated Creatinine Clearance: 41.5 mL/min (A) (by C-G formula based on SCr of 1.35 mg/dL (H)). Liver Function Tests: No results for input(s): AST, ALT, ALKPHOS, BILITOT, PROT, ALBUMIN in the last 168 hours. No results for input(s): LIPASE, AMYLASE in the last 168 hours. No results for input(s): AMMONIA in the last 168 hours. Coagulation Profile: Recent Labs  Lab 08/19/18 0813 08/22/18 1730  INR 1.23 1.15   Cardiac Enzymes: No results for input(s): CKTOTAL, CKMB, CKMBINDEX, TROPONINI in the last 168 hours. BNP (last 3 results) No results for input(s): PROBNP in the last 8760 hours. HbA1C: No results for input(s): HGBA1C in the last 72 hours. CBG: No results for input(s): GLUCAP in the last 168 hours. Lipid Profile: No results for input(s):  CHOL, HDL, LDLCALC, TRIG, CHOLHDL, LDLDIRECT in the last 72 hours. Thyroid Function Tests: No results for input(s): TSH, T4TOTAL, FREET4, T3FREE, THYROIDAB in the last 72 hours. Anemia Panel: No results for input(s): VITAMINB12, FOLATE, FERRITIN, TIBC, IRON, RETICCTPCT in the last 72 hours. Sepsis Labs: Recent Labs  Lab 08/19/18 0813  LATICACIDVEN 0.8    Recent Results (from the past 240 hour(s))  Urine culture     Status: Abnormal   Collection Time: 08/19/18 12:05 AM  Result Value Ref Range Status   Specimen Description   Final    URINE, RANDOM NEPHROSTOMY BAG LEFT KIDNEY Performed at [facility], [address] [place], [place] [address]    Special Requests   Final    NONE Performed at [facility], [address] [place], [place] [address]    Culture (A)  Final    >=100,000 COLONIES/mL METHICILLIN RESISTANT STAPHYLOCOCCUS AUREUS   Report Status 08/21/2018 FINAL  Final   Organism ID, Bacteria METHICILLIN RESISTANT STAPHYLOCOCCUS AUREUS (A)  Final      Susceptibility   Methicillin resistant staphylococcus aureus - MIC*    CIPROFLOXACIN >=8 RESISTANT Resistant     GENTAMICIN <=0.5 SENSITIVE Sensitive     NITROFURANTOIN <=16 SENSITIVE Sensitive     OXACILLIN >=4 RESISTANT Resistant     TETRACYCLINE <=1 SENSITIVE Sensitive     VANCOMYCIN <=0.5 SENSITIVE  Sensitive     TRIMETH/SULFA <=10 SENSITIVE Sensitive     CLINDAMYCIN <=0.25 SENSITIVE Sensitive     RIFAMPIN <=0.5 SENSITIVE Sensitive     Inducible Clindamycin NEGATIVE Sensitive     * >=100,000 COLONIES/mL METHICILLIN RESISTANT STAPHYLOCOCCUS AUREUS  Blood culture (routine x 2)     Status: None (Preliminary result)   Collection Time: 08/19/18 12:50 AM  Result Value Ref Range Status   Specimen Description   Final    BLOOD LEFT FOREARM Performed at [facility], [address] [place], [place] [address]    Special Requests   Final    BOTTLES DRAWN AEROBIC AND ANAEROBIC Blood Culture  adequate volume Performed at [facility], [address] [place], [place] [address]    Culture   Final    NO GROWTH 4 DAYS Performed at [facility], [place] [address] [place], [place] [address]    Report Status PENDING  Incomplete  Blood culture (routine x 2)     Status: None (Preliminary result)   Collection Time: 08/19/18 12:50 AM  Result Value Ref Range Status   Specimen Description   Final    BLOOD RIGHT FOREARM Performed at [facility], [address] [place], [place] [address]    Special Requests   Final    BOTTLES DRAWN AEROBIC AND ANAEROBIC Blood Culture adequate volume Performed at [facility], [address] [place], [place] [address]    Culture   Final    NO GROWTH 4 DAYS Performed at [facility], [place] [address], [place], [place] [address]    Report Status PENDING  Incomplete  MRSA PCR Screening     Status: Abnormal   Collection Time: 08/19/18  2:57 AM  Result Value Ref Range Status   MRSA by PCR POSITIVE (A) NEGATIVE Final    Comment:        The GeneXpert MRSA Assay (FDA approved for NASAL specimens only), is one component of a comprehensive MRSA colonization surveillance program. It is not intended to diagnose MRSA infection nor to guide or monitor treatment for MRSA infections. RESULT CALLED TO, READ BACK BY AND VERIFIED WITH: Alcide Clever [id number] @ [address] Performed at [facility] [address], [place],  [address]          Radiology Studies: No results found.      Scheduled Meds: . carvedilol  3.125 mg Oral BID WC  . Chlorhexidine Gluconate Cloth  6 each Topical Q0600  . Chlorhexidine Gluconate Cloth  6 each Topical Daily  . doxycycline  100 mg Oral Q12H  . DULoxetine  20 mg Oral Daily  . famotidine  20 mg Oral BID  . furosemide  40 mg Oral BID  . lactose free nutrition  237 mL Oral TID WC  . linaclotide  290 mcg Oral Daily  . megestrol  400  mg Oral BID  . oxyCODONE  10 mg Oral QHS  . pramipexole  1.5 mg Oral QPM  . predniSONE  5 mg Oral Q breakfast  . sodium chloride flush  3 mL Intravenous Q12H   Continuous Infusions: . sodium chloride Stopped (08/19/18 0616)  . sodium chloride Stopped (08/21/18 1337)  . sodium chloride       LOS: 4 days     Cordelia Poche, MD [facility] 08/23/2018, 11:44 AM Pager: [phone]  If 7PM-7AM, please contact night-coverage [email or web address] 08/23/2018, 11:44  AM  

## 2018-08-23 NOTE — Consult Note (Signed)
Consultation Note Date: 08/23/2018   Patient Name: Charles Coffey  DOB: 1935/10/11  MRN: [id number]  Age / Sex: 83 y.o., male  PCP: [name], MD Referring Physician: Mariel Aloe, MD  Reason for Consultation: Establishing goals of care and Psychosocial/spiritual support  HPI/Patient Profile: 83 y.o. male  admitted on 08/18/2018 with past medical history significant for coronary artery disease status post CABG in 2002, chronic combined systolic & diastolic CHF, chronic kidney disease stage III, chronic anemia and thrombocytopenia, chronic back pain, and urothelial carcinoma of the bladder ( diagnosed in Nov 2018, disease infiltrating through the bladder into the rectum with retroperitoneal involvement ) with bilateral hydronephrosis and percutaneous nephrostomy tubes, now presenting to the emergency department with severe suprapubic and back pain, reporting that the right-sided tube had fallen out.  He is status post cystoscopy and transurethral resection of a bladder tumor and on 11/25/2017. He is status post bilateral nephrostomy tube placement in November 2018. He is status post diverting colostomy done on 12/01/2017 performed by Dr. Barry Dienes due to colonic obstruction.  Patient's alterations in cognition and periods of agitation   Significant to this scenario is the patient's wife's report of continued cognitive changes.  Wife believes that over the past 9 months the patient shows ongoing signs of cognitive impairment.  She has taken away the car keys, he has inappropriately allocated financial funds (he worked as a Community education officer), his short-term memory is very poor, weapons have been secured in the home,    but maintains long-term memory.   Contrary to the family's observation patient was assessed by neurology in May 2019 and found to have no clear progressive cognitive  disease.  Patient and family face treatment option decisions, advanced directive decisions and anticipatory care needs.  Clinical Assessment and Goals of Care:  This NP [name] reviewed medical records, received report from team, assessed the patient and then meet at the patient's bedside along with his wife  to discuss diagnosis, prognosis, GOC, EOL wishes disposition and options.  Concept of Hospice and Palliative Care were discussed  A detailed discussion was had today regarding advanced directives.  Concepts specific to code status, artifical feeding and hydration, continued IV antibiotics and rehospitalization was had.  The difference between a aggressive medical intervention path  and a palliative comfort care path for this patient at this time was had.  Values and goals of care important to patient and family were attempted to be elicited.  MOST form introduced  Natural trajectory and expectations at EOL were discussed.  Questions and concerns addressed.   Family encouraged to call with questions or concerns.    PMT will continue to support holistically.    HCPOA/wife    SUMMARY OF RECOMMENDATIONS    Code Status/Advance Care Planning:  DNR   Comfort and dignity are a priority of care.  Requesting regular diet/order written.  Patient and his family are struggling with a difficult decisions of when to forego life prolonging measures and shift to a more  comfort, quality and dignity approach.  [name] tells me that they have had "no quality of life for months now"   Symptom Management:   Agitation: Zyprexa ordered by attending for bedtime.  Palliative Prophylaxis:   Aspiration, Bowel Regimen, Delirium Protocol, Frequent Pain Assessment and Oral Care  Additional Recommendations (Limitations, Scope, Preferences):  Family is beginning to question the benefits of continued life prolonging measures.  Wife is hopeful for specialty input regarding continued use of   Keytruda, its long-term risks and benefit, goal of nephrostomy tubes specific to end-of-life situation.  I discussed both with Dr. [name] and Dr. [name].  Dr. [name] plans to speak with the wife this evening at bedside.   I detailed a full comfort path with wife specific to natural trajectory of disease without Keytruda and utilization of opioids to control pain with or without nephrostomy tubes.  Questions and concerns were answered to the best of my ability.  Discussed hospice benefit at home with [name].   Psycho-social/Spiritual:   Desire for further Chaplaincy support:no  Additional Recommendations: Education on Hospice and Grief/Bereavement Support  Prognosis:   Unable to determine--prognosis is  clearly dependent on desire for life prolonging measures, ie.  IV fluids and IV antibiotics, rehospitalizations   Discharge Planning: To Be Determined      Primary Diagnoses: Present on Admission: . Sepsis due to urinary tract infection ([place]) . Essential hypertension . Chronic kidney disease, stage 3 ([place]) . Hydronephrosis due to obstructive malignant neoplasm of bladder ([place]) . Urothelial carcinoma of bladder ([place]) . Chronic systolic heart failure ([place]) . Coronary artery disease of native heart with stable angina pectoris ([place]) . Chronic low back pain . Thrombocytopenia ([place]) . Anemia . Hyponatremia   I have reviewed the medical record, interviewed the patient and family, and examined the patient. The following aspects are pertinent.  Past Medical History:  Diagnosis Date  . Aortic atherosclerosis ([place])   . BPH with urinary obstruction   . CAD (coronary artery disease)    a.  MI 1995, CABG x 3 2002 (patient says that he had LIMA and RIMA grafts). b. ETT-Cardiolite (10/15) with EF 48%, apical scar, no ischemia. c. Nuc 07/2017 abnormal -> cath was performed,  patent LIMA to LAD and SVG to OM 2. RIMA to RCA is atretic. The right coronary artery is occluded distally with  extensive collaterals from the LAD, medical therapy.   . Cancer [facility])    bladder cancer  . Cervical spondylosis without myelopathy October 20, 202017  . Chronic low back pain    sees Dr. Kary Kos   . GERD (gastroesophageal reflux disease)   . Hiatal hernia   . Hyperlipidemia   . Hypertension   . IBS (irritable bowel syndrome)   . Ischemic cardiomyopathy   . Myocardial infarction ([place])   . RBBB   . Restless legs syndrome   . Statin intolerance   . Syncope    a. in 2015 - no apparent cause, was taking sleep medicine at the time. Cardiac workup unremarkable.  . Tubular adenoma of colon    Social History   Socioeconomic History  . Marital status: Married    Spouse name: Not on file  . Number of children: 5  . Years of education: Some coll  . Highest education level: Not on file  Occupational History  . Occupation: retired  Scientific laboratory technician  . Financial resource strain: Not on file  . Food insecurity:    Worry: Not on file  Inability: Not on file  . Transportation needs:    Medical: Not on file    Non-medical: Not on file  Tobacco Use  . Smoking status: Former Smoker    Types: Cigarettes    Last attempt to quit: 12/28/1978    Years since quitting: 39.6  . Smokeless tobacco: Never Used  Substance and Sexual Activity  . Alcohol use: Not Currently    Alcohol/week: 2.0 standard drinks    Types: 1 Glasses of wine, 1 Cans of beer per week    Comment: occassional  . Drug use: No  . Sexual activity: Not Currently  Lifestyle  . Physical activity:    Days per week: Not on file    Minutes per session: Not on file  . Stress: Not on file  Relationships  . Social connections:    Talks on phone: Not on file    Gets together: Not on file    Attends religious service: Not on file    Active member of club or organization: Not on file    Attends meetings of clubs or organizations: Not on file    Relationship status: Not on file  Other Topics Concern  . Not on file  Social History  Narrative   Lives at home w/ his wife   Right-handed   5 cups of coffee per day   Family History  Problem Relation Age of Onset  . Heart attack Mother   . Aneurysm Father        femoral artery  . Heart disease Brother   . Heart disease Maternal Uncle        x 2  . Colon cancer Neg Hx   . Esophageal cancer Neg Hx   . Pancreatic cancer Neg Hx   . Kidney disease Neg Hx   . Liver disease Neg Hx    Scheduled Meds: . carvedilol  3.125 mg Oral BID WC  . Chlorhexidine Gluconate Cloth  6 each Topical Q0600  . Chlorhexidine Gluconate Cloth  6 each Topical Daily  . doxycycline  100 mg Oral Q12H  . DULoxetine  20 mg Oral Daily  . famotidine  20 mg Oral BID  . fentaNYL      . furosemide  40 mg Oral BID  . iopamidol      . lactose free nutrition  237 mL Oral TID WC  . lidocaine      . linaclotide  290 mcg Oral Daily  . megestrol  400 mg Oral BID  . midazolam      . OLANZapine  5 mg Oral QHS  . oxyCODONE  10 mg Oral QHS  . pramipexole  1.5 mg Oral QPM  . predniSONE  5 mg Oral Q breakfast  . sodium chloride flush  3 mL Intravenous Q12H   Continuous Infusions: . sodium chloride Stopped (08/19/18 0616)  . sodium chloride Stopped (08/21/18 1337)  . sodium chloride     PRN Meds:.sodium chloride, sodium chloride, acetaminophen **OR** acetaminophen, alum & mag hydroxide-simeth, diphenhydrAMINE-zinc acetate, guaiFENesin-codeine, lidocaine-prilocaine, LORazepam, Melatonin, methocarbamol, ondansetron **OR** ondansetron (ZOFRAN) IV, oxyCODONE-acetaminophen **AND** oxyCODONE, polyvinyl alcohol, sodium chloride flush Medications Prior to Admission:  Prior to Admission medications   Medication Sig Start Date End Date Taking? Authorizing Provider  acetaminophen (TYLENOL) 500 MG tablet Take 1,000 mg by mouth every 8 (eight) hours as needed for mild pain or moderate pain.   Yes [provider]  carvedilol (COREG) 3.125 MG tablet Take 1 tablet (3.125 mg total) by mouth 2 (  two) times daily.  07/04/18 08/19/18 Yes Donne Hazel, MD  DULoxetine (CYMBALTA) 20 MG capsule Take 1 capsule (20 mg total) by mouth 2 (two) times daily. Take once daily for 2 weeks then increase to twice daily Patient taking differently: Take 20 mg by mouth See admin instructions. Take 20 mg by mouth once daily for 2 weeks then increase to twice daily 08/12/18  Yes Tanner, Lyndon Code., PA-C  feeding supplement (BOOST HIGH PROTEIN) LIQD Take 1 Container by mouth 2 (two) times daily between meals.   Yes [provider]  furosemide (LASIX) 40 MG tablet Take 1 tablet (40 mg total) by mouth 2 (two) times daily. Patient taking differently: Take 40 mg by mouth as needed for fluid or edema.  05/13/18  Yes End, Harrell Gave, MD  hydrOXYzine (ATARAX/VISTARIL) 25 MG tablet Take 1 tablet (25 mg total) by mouth every 4 (four) hours as needed for itching. 07/21/18  Yes [name], MD  lidocaine-prilocaine (EMLA) cream Apply 1 application topically as needed. Patient taking differently: Apply 1 application topically as needed (port access).  12/15/17  Yes Shadad, Mathis Dad, MD  LINZESS 290 MCG CAPS capsule TAKE 1 CAP BY MOUTH ONCE DAILY 30 MIN PRIOR TO A MEAL. MUST HAVE OFFICE VISIT FOR FURTHER REFILLS Patient taking differently: Take 290 mcg by mouth daily.  05/12/18  Yes Pyrtle, Lajuan Lines, MD  megestrol (MEGACE) 40 MG/ML suspension TAKE 10ML BY MOUTH TWICE A DAY Patient taking differently: Take 400 mg by mouth 2 (two) times daily as needed (weight gain).  05/13/18  Yes Wyatt Portela, MD  Melatonin 3 MG CAPS Take 6 mg by mouth at bedtime as needed (sleep).   Yes [provider]  mirabegron ER (MYRBETRIQ) 25 MG TB24 tablet Take 1 tablet (25 mg total) by mouth daily. 02/04/18  Yes Short, Noah Delaine, MD  nitroGLYCERIN (NITROSTAT) 0.4 MG SL tablet Place 1 tablet (0.4 mg total) under the tongue every 5 (five) minutes as needed. 08/19/17 08/19/18 Yes Dunn, Dayna N, PA-C  oxyCODONE (OXYCONTIN) 10 mg 12 hr tablet Take 1 tablet (10 mg  total) by mouth 2 (two) times daily. Patient taking differently: Take 10 mg by mouth at bedtime.  07/22/18  Yes Wyatt Portela, MD  oxyCODONE-acetaminophen (PERCOCET) 10-325 MG tablet Take 1 tablet by mouth every 4 (four) hours as needed for pain. 08/12/18  Yes Tanner, Lyndon Code., PA-C  pembrolizumab [facility]) 100 MG/4ML SOLN Given at infusion center every 3 weeks   Yes [provider]  pramipexole (MIRAPEX) 1 MG tablet TAKE 1 AND 1/2 TABLETS BY MOUTH AT BEDTIME Patient taking differently: Take 1.5 mg by mouth every evening.  06/29/18  Yes [name], MD  predniSONE (DELTASONE) 5 MG tablet Take 1 tablet (5 mg total) by mouth daily with breakfast. 08/12/18  Yes Tanner, Lyndon Code., PA-C  ranitidine (ZANTAC) 300 MG tablet TAKE 1 TABLET BY MOUTH TWICE A DAY Patient taking differently: Take 300 mg by mouth 2 (two) times daily.  07/11/18  Yes Irene Shipper, MD  Tetrahydroz-Glyc-Hyprom-PEG (VISINE MAXIMUM REDNESS RELIEF OP) Place 2 drops into both eyes 2 (two) times daily as needed (dry eyes).    Yes [provider]  triamcinolone cream (KENALOG) 0.1 % Apply topically 3 (three) times daily. To affected areas 07/04/18  Yes Donne Hazel, MD  guaiFENesin-codeine 100-10 MG/5ML syrup Take 5 mLs by mouth every 6 (six) hours as needed for cough. Patient not taking: Reported on 08/19/2018 06/13/18   Tanner,  Lyndon Code., PA-C  prochlorperazine (COMPAZINE) 10 MG tablet Take 1 tablet (10 mg total) by mouth every 6 (six) hours as needed for nausea or vomiting. Patient not taking: Reported on 08/15/2018 12/15/17   Wyatt Portela, MD  QUEtiapine (SEROQUEL) 25 MG tablet TAKE 1 TABLET BY MOUTH EVERYDAY AT BEDTIME Patient not taking: No sig reported 08/03/18   [name], MD   Allergies  Allergen Reactions  . Statins Other (See Comments)    liver effects   Review of Systems  Unable to perform ROS: Mental status change    Physical Exam  Constitutional: He appears well-developed.  Cardiovascular: Normal  rate, regular rhythm and normal heart sounds.  Pulmonary/Chest: Effort normal and breath sounds normal.  Neurological: He is alert.  - confused to current medical situation, easily agtiated   Skin: Skin is warm and dry.    Vital Signs: BP 110/65 (BP Location: Right Arm)   Pulse 70   Temp 98.1 F (36.7 C) (Oral)   Resp 18   Ht 5\' 8"  (1.727 m)   Wt 79.8 kg   SpO2 97%   BMI 26.75 kg/m  Pain Scale: Faces   Pain Score: 0-No pain   SpO2: SpO2: 97 % O2 Device:SpO2: 97 % O2 Flow Rate: .O2 Flow Rate (L/min): 2 L/min  IO: Intake/output summary:   Intake/Output Summary (Last 24 hours) at 08/23/2018 1426 Last data filed at 08/23/2018 1125 Gross per 24 hour  Intake 517.75 ml  Output 2950 ml  Net -2432.25 ml    LBM: Last BM Date: 08/22/18 Baseline Weight: Weight: 80.2 kg Most recent weight: Weight: 79.8 kg      Palliative Assessment/Data: 30 %    Discussed with Dr Lonny Prude and Dr [name] and Dr [name]  Time In:   Time Out:   1000- 1115 am Time In:   Time Out:    1600- 1700 pm Time Total:  135  minutes Greater than 50%  of this time was spent counseling and coordinating care related to the above assessment and plan.  Signed by: [name], NP   Please contact Palliative Medicine Team phone at [phone] for questions and concerns.  For individual provider: See Shea Evans

## 2018-08-23 NOTE — Progress Notes (Signed)
RN entered room with patient's bed alarm going off.  Patient found sitting on the side of the bed and stating, "I don't know what is going on here but I am going home." Pt also threatening to pull out port-a-cath.  RNs trying to reorient patient explaining that he is at [facility] and will have surgery in the morning to exchange his nephrostomy tube. Also had fists balled up and threatening to punch staff.  Security called to bedside. [employer] called per patient request.  After [employer] entered room, patient compliant to return to bed.  Patient reoriented to situation and verbalized understanding. Will continue to monitor pt closely. Carnella [place]adal[place]ara I

## 2018-08-23 NOTE — Consult Note (Signed)
   [facility] CM Inpatient Consult   08/23/2018  Charles Coffey May 27, 1935 [id number]   Patient screened for potential [facility] Care Management program due to unplanned readmission risk score of 42% (extreme).   Chart reviewed. Noted palliative care consult pending. Spoke with  inpatient RNCM. Will continue to follow for disposition planning and progression.  Marthenia Rolling, MSN-Ed, RN,BSN [facility] Liaison [phone]

## 2018-08-23 NOTE — Progress Notes (Signed)
This am, patient got extremely agitated with staff and threatened them, "If you don't let me put on my clothes and leave I will bash your heads."  Also scratching at tubes. Despite attempts for reorientation, patient continued to be verbally and physically aggressive with staff.  Also grabbing IV line in hand and threatening to pull it out. PRN medication given for anxiety. NP on call paged regarding further orders to maintain patient's safety. Carnella Guadal[place]ara I

## 2018-08-23 NOTE — Progress Notes (Signed)
Events in the last few days noted.  He still has issues with delirium and confusion especially at nighttime.  His right nephrostomy tube was removed and suprapubic catheter was placed.  He is scheduled to receive Keytruda this Friday which will be withheld given his ongoing issues.  Although he is confused at times he has been more clear this afternoon and was able to verbalize his frustration with his overall health status and would like to reduce the number of tubes that is interfering with his quality of life.  We have discussed the possibility with holding any systemic therapy for the time being given his overall quality of life has been suffering.  Keytruda have added some decline in his quality of life because of itching and for this reason I will hold off on any treatment for the time being.  We also discussed the possibility of going with comfort care only which he and his wife are also contemplating.  For the meantime, I will defer the management of his tubes to Dr. [name] and I will continue to check on him periodically while he is hospitalized.

## 2018-08-23 NOTE — Progress Notes (Signed)
Patient ID: Charles Coffey, male   DOB: [birth year], 83 y.o.   MRN: [id number]   [name] has had the right NT converted back to a Nephroureteral catheter that has been capped and his urine production now is from the SP tube.  He had the left NT exchanged but has minimal output with a very atrophic kidney.    I spoke with the palliative care team and the family and they are probably going to want to stop the [facility] which I think is very reasonable considering Charles Coffey poor quality of life and side effects.  I discussed the tube status with Charles Coffey and his wife and I think at this point we need to leave as is for the time being, but the next time the tubes need to be changed, it might be worthwhile to attempt placement of a left nephroureteral catheter as well.   I am concerned that just removing it would result in pyonephrosis and sepsis even though the kidney functions poorly.   If a nephroureteral catheter could be passed, it could be capped and we could rely on the SP tube for external drainage.   If he pulls the tubes out again, I am not sure I would recommend replacement without a long discussion with the family since he has a very poor prognosis.    He should have his tubes exchanged and the attempt at left nephroureteral catheter placement in 4-6 weeks if his condition warrants it.

## 2018-08-24 DIAGNOSIS — N390 Urinary tract infection, site not specified: Secondary | ICD-10-CM

## 2018-08-24 DIAGNOSIS — G893 Neoplasm related pain (acute) (chronic): Secondary | ICD-10-CM

## 2018-08-24 DIAGNOSIS — A419 Sepsis, unspecified organism: Secondary | ICD-10-CM

## 2018-08-24 LAB — BASIC METABOLIC PANEL
Anion gap: 7 (ref 5–15)
BUN: 36 mg/dL — AB (ref 8–23)
CO2: 26 mmol/L (ref 22–32)
Calcium: 9.2 mg/dL (ref 8.9–10.3)
Chloride: 105 mmol/L (ref 98–111)
Creatinine, Ser: 1.53 mg/dL — ABNORMAL HIGH (ref 0.61–1.24)
GFR calc Af Amer: 47 mL/min — ABNORMAL LOW (ref >60)
GFR, EST NON AFRICAN AMERICAN: 41 mL/min — AB (ref >60)
GLUCOSE: 98 mg/dL (ref 70–99)
Potassium: 3.9 mmol/L (ref 3.5–5.1)
Sodium: 138 mmol/L (ref 135–145)

## 2018-08-24 LAB — CULTURE, BLOOD (ROUTINE X 2)
Culture: NO GROWTH
Culture: NO GROWTH
SPECIAL REQUESTS: ADEQUATE
Special Requests: ADEQUATE

## 2018-08-24 LAB — CBC
HCT: 30.4 % — ABNORMAL LOW (ref 39.0–52.0)
Hemoglobin: 10.3 g/dL — ABNORMAL LOW (ref 13.0–17.0)
MCH: 31.5 pg (ref 26.0–34.0)
MCHC: 33.9 g/dL (ref 30.0–36.0)
MCV: 93 fL (ref 78.0–100.0)
Platelets: 66 10*3/uL — ABNORMAL LOW (ref 150–400)
RBC: 3.27 MIL/uL — ABNORMAL LOW (ref 4.22–5.81)
RDW: 16.2 % — ABNORMAL HIGH (ref 11.5–15.5)
WBC: 5.3 10*3/uL (ref 4.0–10.5)

## 2018-08-24 MED ORDER — OLANZAPINE 5 MG PO TABS: 5.0000 mg | ORAL_TABLET | Freq: Every day | ORAL | 0 refills | Status: DC

## 2018-08-24 MED ORDER — FAMOTIDINE 20 MG PO TABS: 20.0000 mg | ORAL_TABLET | Freq: Two times a day (BID) | ORAL | 0 refills | Status: DC

## 2018-08-24 MED ORDER — HEPARIN SOD (PORK) LOCK FLUSH 100 UNIT/ML IV SOLN
500.0000 [IU] | INTRAVENOUS | Status: DC | PRN
  Administered 2018-08-24: 500 [IU]
  Filled 2018-08-24 (×2): qty 5

## 2018-08-24 MED ORDER — POLYVINYL ALCOHOL 1.4 % OP SOLN: 1.0000 [drp] | Freq: Two times a day (BID) | OPHTHALMIC | 0 refills | Status: DC | PRN

## 2018-08-24 MED ORDER — DOXYCYCLINE HYCLATE 100 MG PO TABS: 100.0000 mg | ORAL_TABLET | Freq: Two times a day (BID) | ORAL | 0 refills | Status: DC

## 2018-08-24 MED ORDER — HEPARIN SOD (PORK) LOCK FLUSH 100 UNIT/ML IV SOLN
500.0000 [IU] | INTRAVENOUS | Status: DC
  Filled 2018-08-24: qty 5

## 2018-08-24 NOTE — Progress Notes (Signed)
Referring Physician(s): Dr. Cordelia Poche  Supervising Physician: Daryll Brod  Patient Status:  [facility] - In-pt  Chief Complaint: Follow up right PCN conversion to nephroureteral catheter/left PCN exchange on 8/27; placement of SP catheter on 8/23  Subjective:  83 y/o M with PMH significant for CAD s/p CABG 2002, CHF, CKD III, anemia and urothelial carcinoma of the bladder with bilateral hydronephrosis and bilateral percutaneous nephrostomy tubes. Patient well known to IR, bilateral PCNs placed 11/2017 with several exchanges most recently left PCN was changed 8/20 by Dr. Kathlene Cote. Patient presented to [facility] ED on 8/22 with complaints of suprapubic pain, minimal urine output from left PCN and right PCN dislodgement. In ED foley or coude catheters could not be passed, patient had intractable pain with several doses of fentanyl. He was admitted for further evaluation - urology was consulted who advised not trying for further uretheral catheterization due to the type of cancer patient has. IR was consulted for replacement of right PCN and placement of suprapubic cathter at the same time was requested Dr. Matilde Sprang - both procedures performed by Dr. Annamaria Boots on 8/23.  IR was again consulted on 8/26 for conversion of right PCN to nephroureteral catheter and exchange of left PCN d/t possible infectious source per ID - both were performed on 8/27. Per urology most recent note, plans to convert left PCN to nephroureteral catheter in 4-6 weeks if patient is stable. Family met with palliative care team yesterday as they feel patient experiencing poor quality of life and he has voiced that he would like to have tubes removed. They are still deciding on palliative vs aggressive measures; Beryle Flock has been held by oncology as of now.   Allergies: Statins  Medications: Prior to Admission medications   Medication Sig Start Date End Date Taking? Authorizing Provider  acetaminophen (TYLENOL) 500 MG tablet Take  1,000 mg by mouth every 8 (eight) hours as needed for mild pain or moderate pain.   Yes [provider]  carvedilol (COREG) 3.125 MG tablet Take 1 tablet (3.125 mg total) by mouth 2 (two) times daily. 07/04/18 08/19/18 Yes Donne Hazel, MD  DULoxetine (CYMBALTA) 20 MG capsule Take 1 capsule (20 mg total) by mouth 2 (two) times daily. Take once daily for 2 weeks then increase to twice daily Patient taking differently: Take 20 mg by mouth See admin instructions. Take 20 mg by mouth once daily for 2 weeks then increase to twice daily 08/12/18  Yes Tanner, Lyndon Code., PA-C  feeding supplement (BOOST HIGH PROTEIN) LIQD Take 1 Container by mouth 2 (two) times daily between meals.   Yes [provider]  furosemide (LASIX) 40 MG tablet Take 1 tablet (40 mg total) by mouth 2 (two) times daily. Patient taking differently: Take 40 mg by mouth as needed for fluid or edema.  05/13/18  Yes End, Harrell Gave, MD  hydrOXYzine (ATARAX/VISTARIL) 25 MG tablet Take 1 tablet (25 mg total) by mouth every 4 (four) hours as needed for itching. 07/21/18  Yes [name], MD  lidocaine-prilocaine (EMLA) cream Apply 1 application topically as needed. Patient taking differently: Apply 1 application topically as needed (port access).  12/15/17  Yes Shadad, Mathis Dad, MD  LINZESS 290 MCG CAPS capsule TAKE 1 CAP BY MOUTH ONCE DAILY 30 MIN PRIOR TO A MEAL. MUST HAVE OFFICE VISIT FOR FURTHER REFILLS Patient taking differently: Take 290 mcg by mouth daily.  05/12/18  Yes Pyrtle, Lajuan Lines, MD  megestrol (MEGACE) 40 MG/ML suspension TAKE 10ML BY  MOUTH TWICE A DAY Patient taking differently: Take 400 mg by mouth 2 (two) times daily as needed (weight gain).  05/13/18  Yes Wyatt Portela, MD  Melatonin 3 MG CAPS Take 6 mg by mouth at bedtime as needed (sleep).   Yes [provider]  mirabegron ER (MYRBETRIQ) 25 MG TB24 tablet Take 1 tablet (25 mg total) by mouth daily. 02/04/18  Yes Short, Noah Delaine, MD  nitroGLYCERIN  (NITROSTAT) 0.4 MG SL tablet Place 1 tablet (0.4 mg total) under the tongue every 5 (five) minutes as needed. 08/19/17 08/19/18 Yes Dunn, Dayna N, PA-C  oxyCODONE (OXYCONTIN) 10 mg 12 hr tablet Take 1 tablet (10 mg total) by mouth 2 (two) times daily. Patient taking differently: Take 10 mg by mouth at bedtime.  07/22/18  Yes Wyatt Portela, MD  oxyCODONE-acetaminophen (PERCOCET) 10-325 MG tablet Take 1 tablet by mouth every 4 (four) hours as needed for pain. 08/12/18  Yes Tanner, Lyndon Code., PA-C  pembrolizumab [facility]) 100 MG/4ML SOLN Given at infusion center every 3 weeks   Yes [provider]  pramipexole (MIRAPEX) 1 MG tablet TAKE 1 AND 1/2 TABLETS BY MOUTH AT BEDTIME Patient taking differently: Take 1.5 mg by mouth every evening.  06/29/18  Yes [name], MD  predniSONE (DELTASONE) 5 MG tablet Take 1 tablet (5 mg total) by mouth daily with breakfast. 08/12/18  Yes Tanner, Lyndon Code., PA-C  ranitidine (ZANTAC) 300 MG tablet TAKE 1 TABLET BY MOUTH TWICE A DAY Patient taking differently: Take 300 mg by mouth 2 (two) times daily.  07/11/18  Yes Irene Shipper, MD  Tetrahydroz-Glyc-Hyprom-PEG (VISINE MAXIMUM REDNESS RELIEF OP) Place 2 drops into both eyes 2 (two) times daily as needed (dry eyes).    Yes [provider]  triamcinolone cream (KENALOG) 0.1 % Apply topically 3 (three) times daily. To affected areas 07/04/18  Yes Donne Hazel, MD  guaiFENesin-codeine 100-10 MG/5ML syrup Take 5 mLs by mouth every 6 (six) hours as needed for cough. Patient not taking: Reported on 08/19/2018 06/13/18   Harle Stanford., PA-C  prochlorperazine (COMPAZINE) 10 MG tablet Take 1 tablet (10 mg total) by mouth every 6 (six) hours as needed for nausea or vomiting. Patient not taking: Reported on 08/15/2018 12/15/17   Wyatt Portela, MD  QUEtiapine (SEROQUEL) 25 MG tablet TAKE 1 TABLET BY MOUTH EVERYDAY AT BEDTIME Patient not taking: No sig reported 08/03/18   [name], MD     Vital Signs: BP 115/64  (BP Location: Left Arm)   Pulse 71   Temp 98.2 F (36.8 C) (Oral)   Resp 20   Ht _0  (1.727 m)   Wt 175 lb 14.8 oz (79.8 kg)   SpO2 98%   BMI 26.75 kg/m   Physical Exam  Constitutional: He is oriented to person, place, and time. No distress.  HENT:  Head: Normocephalic.  Cardiovascular: Normal rate, regular rhythm and normal heart sounds.  Pulmonary/Chest: Effort normal and breath sounds normal.  Abdominal: Soft. He exhibits no distension. There is no tenderness.  Bilateral PCN and SP catheter in place.  Neurological: He is alert and oriented to person, place, and time.  Skin: Skin is warm and dry. He is not diaphoretic. No pallor.  Nursing note and vitals reviewed.   Imaging: Ir Convert Right Nephrostomy To Nephroureteral Cath  Result Date: 08/23/2018 CLINICAL DATA:  Bladder carcinoma post chemotherapy. Recent placement of suprapubic catheter. EXAM: RIGHT NEPHROSTOMY CONVERSION TO NEPHROURETERAL CATHETER LEFT NEPHROSTOMY EXCHANGE  UNDER FLUOROSCOPY FLUOROSCOPY TIME:  seconds TECHNIQUE: The procedure, risks (including but not limited to bleeding, infection, organ damage ), benefits, and alternatives were explained to the patient. Questions regarding the procedure were encouraged and answered. The patient understands and consents to the procedure. The nephrostomy tubes and surrounding skin were prepped with Betadine, draped in usual sterile fashion. A small amount of contrast was injected through the right nephrostomy catheter to opacify the renal collecting system. The catheter was cut and exchanged over a 0.035" angiographic wire for a 5 French Kumpe catheter, negotiated into the urinary bladder. Over this, a 24 cm 10-French nephroureteral catheter was advanced antegrade, formed with the distal end in the urinary bladder, locking loop centrally within the collecting system under fluoroscopy. Contrast injection confirms appropriate positioning. Catheter was capped to allow drainage to the  urinary bladder. In a similar fashion, the left nephrostomy catheter was injected, cut, and exchanged for a new 10-French pigtail catheter, formed centrally within the left renal collecting system. Injection confirms appropriate positioning and patency. Catheter was placed to external gravity drain bag. Both catheters were secured externally with a Statlock devices and additional 0 Prolene sutures. The patient tolerated the procedure well. COMPLICATIONS: None immediate IMPRESSION: 1. Technically successful conversion of right nephrostomy catheterfor antegrade nephroureteral catheter under fluoroscopy, left capped to allow antegrade drainage. 2. Technically successful exchange of left nephrostomy catheter under fluoroscopy. Electronically Signed   By: Lucrezia Europe M.D.   On: 08/23/2018 16:58   Ir Nephrostomy Exchange Left  Result Date: 08/23/2018 CLINICAL DATA:  Bladder carcinoma post chemotherapy. Recent placement of suprapubic catheter. EXAM: RIGHT NEPHROSTOMY CONVERSION TO NEPHROURETERAL CATHETER LEFT NEPHROSTOMY EXCHANGE UNDER FLUOROSCOPY FLUOROSCOPY TIME:  seconds TECHNIQUE: The procedure, risks (including but not limited to bleeding, infection, organ damage ), benefits, and alternatives were explained to the patient. Questions regarding the procedure were encouraged and answered. The patient understands and consents to the procedure. The nephrostomy tubes and surrounding skin were prepped with Betadine, draped in usual sterile fashion. A small amount of contrast was injected through the right nephrostomy catheter to opacify the renal collecting system. The catheter was cut and exchanged over a 0.035" angiographic wire for a 5 French Kumpe catheter, negotiated into the urinary bladder. Over this, a 24 cm 10-French nephroureteral catheter was advanced antegrade, formed with the distal end in the urinary bladder, locking loop centrally within the collecting system under fluoroscopy. Contrast injection confirms  appropriate positioning. Catheter was capped to allow drainage to the urinary bladder. In a similar fashion, the left nephrostomy catheter was injected, cut, and exchanged for a new 10-French pigtail catheter, formed centrally within the left renal collecting system. Injection confirms appropriate positioning and patency. Catheter was placed to external gravity drain bag. Both catheters were secured externally with a Statlock devices and additional 0 Prolene sutures. The patient tolerated the procedure well. COMPLICATIONS: None immediate IMPRESSION: 1. Technically successful conversion of right nephrostomy catheterfor antegrade nephroureteral catheter under fluoroscopy, left capped to allow antegrade drainage. 2. Technically successful exchange of left nephrostomy catheter under fluoroscopy. Electronically Signed   By: Lucrezia Europe M.D.   On: 08/23/2018 16:58    Labs:  CBC: Recent Labs    08/20/18 0335 08/21/18 1019 08/23/18 0403 08/24/18 0558  WBC 8.2 5.7 5.3 5.3  HGB 9.8* 9.7* 9.8* 10.3*  HCT 28.6* 28.5* 28.7* 30.4*  PLT 47* 53* 58* 66*    COAGS: Recent Labs    12/27/17 1227 07/04/18 0346 08/19/18 0813 08/22/18 1730  INR 1.13 1.22  1.23 1.15  APTT 33  --  36 37*    BMP: Recent Labs    08/20/18 0335 08/21/18 1019 08/23/18 0403 08/24/18 0558  NA 137 134* 138 138  K 4.2 3.7 3.6 3.9  CL 109 107 106 105  CO2 19* 21* 24 26  GLUCOSE 117* 126* 108* 98  BUN 29* 23 30* 36*  CALCIUM 9.0 9.3 9.3 9.2  CREATININE 1.65* 1.25* 1.35* 1.53*  GFRNONAA 37* 52* 48* 41*  GFRAA 43* >60 55* 47*    LIVER FUNCTION TESTS: Recent Labs    06/30/18 2009 07/01/18 0448 07/22/18 1226 08/12/18 1239  BILITOT 0.4 0.9 0.3 0.4  AST _0 14*  ALT _1 ALKPHOS 48 42 57 61  PROT 6.6 6.0* 7.1 7.2  ALBUMIN 3.3* 3.0* 3.9 3.8    Assessment and Plan:  Bilateral PCNs due to chronic bilateral hydronephrosis in setting of urothelial carcinoma of the bladder. Patient well known to IR with  original bilateral PCN placements 11/2017 with several exchanges due to dislodgement or infection - per wife patient becomes disoriented and agitated and will pull at tubing causing it to dislodge. Right PCN converted to nephroureteral cathter on 8/27 at request of urology as patient previously tolerated this well; left PCN was also exchanged on 8/27. SP catheter was placed in IR on 8/23.   Left PCN continues with minimal output with atrophic left kidney (~50 cc in last 24 hours). Per urology they plan to convert left to nephroureteral catheter in 4-6 weeks if patient is stable; palliative care has seen patient and family - per wife they have not made a decision regarding care at this time. Beryle Flock has been held per oncology due to side effects.   IR to continue to follow along. D/c per admitting service.  Electronically Signed: Joaquim Nam, PA-C 08/24/2018, 10:08 AM   I spent a total of 15 Minutes at the the patient's bedside AND on the patient's hospital floor or unit, greater than 50% of which was counseling/coordinating care for right nephroureteral catheter, right PCN and SP catheter.

## 2018-08-24 NOTE — Progress Notes (Signed)
Patient ID: JAHEEM HEDGEPATH, male   DOB: 10/13/1935, 83 y.o.   MRN: [id number]  This NP visited patient at the bedside as a follow up to  yesterday's [place] for continued conversation regarding GOCs and symptom management.  Patient is much more alert today.  Patient is OOB to the chair and wife reports "a much better night".  Patient has been able to verbalize desire for a more comfort path.  Per wife Beryle Flock is on hold for now.  "We are taking it one day at a time"  Hope is to discharge home today.  Long discussion regarding home heath vs hospice benefit.   Regis Bill is interested in speaking with hospice liaison before making decision.  Will request hospice information.  Discussed with patient and his wife the importance of continued conversation with each other, family and their  medical providers regarding overall plan of care and treatment options,  ensuring decisions are within the context of the patients values and GOCs.  Questions and concerns addressed   Discussed with Dr Verlon Au  Total time spent on the unit was 35 minutes  Greater than 50% of the time was spent in counseling and coordination of care  [name] NP  Palliative Medicine Team Team Phone # [phone] Pager [phone]

## 2018-08-24 NOTE — Discharge Summary (Signed)
Physician Discharge Summary  Charles Coffey:[id number] DOB: 26-May-1935 DOA: 08/18/2018  PCP: [name], MD  Admit date: 08/18/2018 Discharge date: 08/24/2018  Time spent: 35 minutes  Recommendations for Outpatient Follow-up:  1. Recommend outpatient goals of care and discussion with urology/oncology further planning 2. Needs Chem-7 and CBC 1 week 3. Needs routine nephrostomy management and care  Discharge Diagnoses:  Principal Problem:   Sepsis due to urinary tract infection ([place]) Active Problems:   Essential hypertension   Coronary artery disease of native heart with stable angina pectoris (HCC)   Chronic low back pain   Anemia   Chronic kidney disease, stage 3 (HCC)   Hydronephrosis due to obstructive malignant neoplasm of bladder (HCC)   Urothelial carcinoma of bladder (HCC)   Thrombocytopenia (HCC)   Chronic systolic heart failure ([place])   Hyponatremia   Palliative care by specialist   DNR (do not resuscitate)   Agitation   Discharge Condition: Guarded  Diet recommendation: Heart healthy  Filed Weights   08/20/18 0457 08/22/18 0447 08/23/18 0442  Weight: 79.8 kg 78.4 kg 79.8 kg    History of present illness:  83 m CABG 9562, systolic/diastolic heart failure, CKD 3, urothelial CA bladder + bilateral hydronephrosis + percutaneous nephrostomies-came to emergency room 8/23 with severe pain fever 100.8 right-sided percutaneous tube came out   Note came to emergency room 8/20 for dislodgment of left percutaneous drain-replaced in IR and patient was sent back home   On admission hyponatremic 130, mild metabolic acidosis CO2 18  Comorbid's irritable bowel, RBBB, chronic low back pain, tubular adenoma:, Hiatal hernia    He A & Plan Sepsis-secondary to urinary retention, dislodged nephrostomy tube-urology consulted-appreciated-had conversion to nephroureteral catheter 08/23/2018 for adequate bladder/renal drainage-received treatment as per ID for staph aureus in  urine-blood culture NGTD-vancomycin -->doxycycline 8/26 and will receive a total of 10 days duration therapy Will CC outpatient urologist as well as oncologist regarding the same-suspect options are limited--recommend outpatient further discussions about palliative therapies and note that Keytruda have been discontinued this admission   Metabolic acidosis-secondary to sepsis-resolved on 8/27   Urothelial CA-previously on Keytruda-appears to be contemplating coming off the same-defer discussion to oncology/palliative care/urologist as an outpatient   CAD 2002 multiple interventions-stable  Delirium-this hospital stay Zydis was added with good effect and patient seemed to come back to his baseline-note that he is on multiple sedating meds such as oxycodone OxyContin and no prescriptions have been given  Systolic/diastolic heart failure both chronic with no acute component-last EF 25-30% and stable  Chronic kidney disease stage III-patient was at about his usual baseline on discharge  Thrombocytopenia-probably related to his cancer versus chemo-stable    Procedures:  Percutaneous drain placement 8/23  Percutaneous conversion to nephroureteral catheter   Consultations:  Urology  Oncology  Discharge Exam: Vitals:   08/23/18 2110 08/24/18 0348  BP: 98/62 115/64  Pulse: 64 71  Resp: 18 20  Temp: 97.8 F (36.6 C) 98.2 F (36.8 C)  SpO2: 97% 98%    General: Awake alert not completely coherent slightly confused Cardiovascular: S1-S2 no murmur rub or gallop Respiratory: Clinically clear Abdomen is soft with percutaneous nephrostomy tubes bilaterally he does have a nephro ureterostomy as well and the areas appear clean  Discharge Instructions   Discharge Instructions    Diet - low sodium heart healthy   Complete by:  As directed    Discharge instructions   Complete by:  As directed    Finish course of Doxycycline  Follow up with palliative care   Increase activity slowly    Complete by:  As directed      Allergies as of 08/24/2018      Reactions   Statins Other (See Comments)   liver effects      Medication List    STOP taking these medications   acetaminophen 500 MG tablet Commonly known as:  TYLENOL   KEYTRUDA 100 MG/4ML Soln Generic drug:  pembrolizumab   nitroGLYCERIN 0.4 MG SL tablet Commonly known as:  NITROSTAT   predniSONE 5 MG tablet Commonly known as:  DELTASONE     TAKE these medications   carvedilol 3.125 MG tablet Commonly known as:  COREG Take 1 tablet (3.125 mg total) by mouth 2 (two) times daily.   doxycycline 100 MG tablet Commonly known as:  VIBRA-TABS Take 1 tablet (100 mg total) by mouth every 12 (twelve) hours.   DULoxetine 20 MG capsule Commonly known as:  CYMBALTA Take 1 capsule (20 mg total) by mouth 2 (two) times daily. Take once daily for 2 weeks then increase to twice daily What changed:    when to take this  additional instructions   famotidine 20 MG tablet Commonly known as:  PEPCID Take 1 tablet (20 mg total) by mouth 2 (two) times daily.   feeding supplement Liqd Take 1 Container by mouth 2 (two) times daily between meals.   furosemide 40 MG tablet Commonly known as:  LASIX Take 1 tablet (40 mg total) by mouth 2 (two) times daily. What changed:    when to take this  reasons to take this   guaiFENesin-codeine 100-10 MG/5ML syrup Take 5 mLs by mouth every 6 (six) hours as needed for cough.   hydrOXYzine 25 MG tablet Commonly known as:  ATARAX/VISTARIL Take 1 tablet (25 mg total) by mouth every 4 (four) hours as needed for itching.   lidocaine-prilocaine cream Commonly known as:  EMLA Apply 1 application topically as needed. What changed:  reasons to take this   LINZESS 290 MCG Caps capsule Generic drug:  linaclotide TAKE 1 CAP BY MOUTH ONCE DAILY 30 MIN PRIOR TO A MEAL. MUST HAVE OFFICE VISIT FOR FURTHER REFILLS What changed:  See the new instructions.   megestrol 40 MG/ML  suspension Commonly known as:  MEGACE TAKE 10ML BY MOUTH TWICE A DAY What changed:  See the new instructions.   Melatonin 3 MG Caps Take 6 mg by mouth at bedtime as needed (sleep).   mirabegron ER 25 MG Tb24 tablet Commonly known as:  MYRBETRIQ Take 1 tablet (25 mg total) by mouth daily.   OLANZapine 5 MG tablet Commonly known as:  ZYPREXA Take 1 tablet (5 mg total) by mouth at bedtime.   oxyCODONE 10 mg 12 hr tablet Commonly known as:  OXYCONTIN Take 1 tablet (10 mg total) by mouth 2 (two) times daily. What changed:  when to take this   oxyCODONE-acetaminophen 10-325 MG tablet Commonly known as:  PERCOCET Take 1 tablet by mouth every 4 (four) hours as needed for pain.   polyvinyl alcohol 1.4 % ophthalmic solution Commonly known as:  LIQUIFILM TEARS Place 1 drop into both eyes 2 (two) times daily as needed (dry eyes).   pramipexole 1 MG tablet Commonly known as:  MIRAPEX TAKE 1 AND 1/2 TABLETS BY MOUTH AT BEDTIME What changed:  See the new instructions.   prochlorperazine 10 MG tablet Commonly known as:  COMPAZINE Take 1 tablet (10 mg total) by mouth every 6 (six)  hours as needed for nausea or vomiting.   QUEtiapine 25 MG tablet Commonly known as:  SEROQUEL TAKE 1 TABLET BY MOUTH EVERYDAY AT BEDTIME   ranitidine 300 MG tablet Commonly known as:  ZANTAC TAKE 1 TABLET BY MOUTH TWICE A DAY   triamcinolone cream 0.1 % Commonly known as:  KENALOG Apply topically 3 (three) times daily. To affected areas   VISINE MAXIMUM REDNESS RELIEF OP Place 2 drops into both eyes 2 (two) times daily as needed (dry eyes).      Allergies  Allergen Reactions  . Statins Other (See Comments)    liver effects   Follow-up Information    [facility] Follow up.   Specialty:  [place] Why:  [facility] nursing Contact information: [address] [place] [address] [phone]            The results of significant diagnostics from this  hospitalization (including imaging, microbiology, ancillary and laboratory) are listed below for reference.    Significant Diagnostic Studies: Ir Fluoro Guide Ndl Plmt / Bx  Result Date: 08/19/2018 INDICATION: Hydronephrosis, accidental removal of the right nephrostomy catheter, urinary retention EXAM: Fluoroscopic right nephrostomy tube replacement through the existing tract Ultrasound and fluoroscopic suprapubic catheter insertion Date:  08/19/2018 08/19/2018 1:39 pm Radiologist:  Jerilynn Mages. Daryll Brod, MD Guidance:  Ultrasound and fluoroscopy COMPARISON:  04/11/2018 FLUOROSCOPY TIME:  Two minutes 36 seconds (68 mGy). COMPLICATIONS: None immediate. MEDICATIONS: 1% lidocaine local ANESTHESIA/SEDATION: A total of Versed 2.0 mg and Fentanyl 100 mcg was administered intravenously. Moderate Sedation Time: 30 minutes. The patient's level of consciousness and vital signs were monitored continuously by radiology nursing throughout the procedure under my direct supervision. CONTRAST:  20 cc PROCEDURE: Informed written consent was obtained from the patient after a thorough discussion of the procedural risks, benefits and alternatives. All questions were addressed. Maximal Sterile Barrier Technique was utilized including caps, mask, sterile gowns, sterile gloves, sterile drape, hand hygiene and skin antiseptic. A timeout was performed prior to the initiation of the procedure. Right nephrostomy replacement: Under sterile conditions and local anesthesia, the existing right nephrostomy percutaneous tract was cannulated with a 5 French catheter. Contrast injection demonstrates the small patent percutaneous tract. There is also surrounding retroperitoneal extravasation noted. Eventually the glidewire and catheter were advanced into the collecting system and ureter. Contrast injection confirms position within the collecting system. Amplatz guidewire inserted followed by a 10 French nephrostomy with the retention loop formed the renal  pelvis. Position confirmed with fluoroscopy and contrast injection. Images obtained for documentation. Catheter secured with a Prolene suture and a sterile dressing. Gravity drainage bag connected. No immediate complication. Patient tolerated the procedure well. Suprapubic catheter insertion: Patient was repositioned supine. Preliminary ultrasound performed. Distended bladder localized in the midline. Images obtained for documentation. Under sterile conditions and local anesthesia, an 18 gauge introducer needle was advanced under direct ultrasound from a midline anterior approach into the bladder. There was return of urine. Amplatz guidewire inserted followed by tract dilatation to insert a 14 French catheter. Catheter position confirmed within the bladder with ultrasound and fluoroscopy. Images obtained for documentation. Catheter secured with Prolene suture and connected to external gravity drainage bag. Sterile dressing applied. No immediate complication. Patient tolerated the procedure well. IMPRESSION: Successful right nephrostomy replacement through the existing percutaneous tract. Successful fluoroscopic and ultrasound placement of a new suprapubic catheter (14 [place]). Electronically Signed   By: Jerilynn Mages.  Shick M.D.   On: 08/19/2018 14:15   Ir Convert Right Nephrostomy  To Nephroureteral Cath  Result Date: 08/23/2018 CLINICAL DATA:  Bladder carcinoma post chemotherapy. Recent placement of suprapubic catheter. EXAM: RIGHT NEPHROSTOMY CONVERSION TO NEPHROURETERAL CATHETER LEFT NEPHROSTOMY EXCHANGE UNDER FLUOROSCOPY FLUOROSCOPY TIME:  seconds TECHNIQUE: The procedure, risks (including but not limited to bleeding, infection, organ damage ), benefits, and alternatives were explained to the patient. Questions regarding the procedure were encouraged and answered. The patient understands and consents to the procedure. The nephrostomy tubes and surrounding skin were prepped with Betadine, draped in usual sterile  fashion. A small amount of contrast was injected through the right nephrostomy catheter to opacify the renal collecting system. The catheter was cut and exchanged over a 0.035" angiographic wire for a 5 French Kumpe catheter, negotiated into the urinary bladder. Over this, a 24 cm 10-French nephroureteral catheter was advanced antegrade, formed with the distal end in the urinary bladder, locking loop centrally within the collecting system under fluoroscopy. Contrast injection confirms appropriate positioning. Catheter was capped to allow drainage to the urinary bladder. In a similar fashion, the left nephrostomy catheter was injected, cut, and exchanged for a new 10-French pigtail catheter, formed centrally within the left renal collecting system. Injection confirms appropriate positioning and patency. Catheter was placed to external gravity drain bag. Both catheters were secured externally with a Statlock devices and additional 0 Prolene sutures. The patient tolerated the procedure well. COMPLICATIONS: None immediate IMPRESSION: 1. Technically successful conversion of right nephrostomy catheterfor antegrade nephroureteral catheter under fluoroscopy, left capped to allow antegrade drainage. 2. Technically successful exchange of left nephrostomy catheter under fluoroscopy. Electronically Signed   By: Lucrezia Europe M.D.   On: 08/23/2018 16:58   Ir Nephrostomy Exchange Left  Result Date: 08/23/2018 CLINICAL DATA:  Bladder carcinoma post chemotherapy. Recent placement of suprapubic catheter. EXAM: RIGHT NEPHROSTOMY CONVERSION TO NEPHROURETERAL CATHETER LEFT NEPHROSTOMY EXCHANGE UNDER FLUOROSCOPY FLUOROSCOPY TIME:  seconds TECHNIQUE: The procedure, risks (including but not limited to bleeding, infection, organ damage ), benefits, and alternatives were explained to the patient. Questions regarding the procedure were encouraged and answered. The patient understands and consents to the procedure. The nephrostomy tubes and  surrounding skin were prepped with Betadine, draped in usual sterile fashion. A small amount of contrast was injected through the right nephrostomy catheter to opacify the renal collecting system. The catheter was cut and exchanged over a 0.035" angiographic wire for a 5 French Kumpe catheter, negotiated into the urinary bladder. Over this, a 24 cm 10-French nephroureteral catheter was advanced antegrade, formed with the distal end in the urinary bladder, locking loop centrally within the collecting system under fluoroscopy. Contrast injection confirms appropriate positioning. Catheter was capped to allow drainage to the urinary bladder. In a similar fashion, the left nephrostomy catheter was injected, cut, and exchanged for a new 10-French pigtail catheter, formed centrally within the left renal collecting system. Injection confirms appropriate positioning and patency. Catheter was placed to external gravity drain bag. Both catheters were secured externally with a Statlock devices and additional 0 Prolene sutures. The patient tolerated the procedure well. COMPLICATIONS: None immediate IMPRESSION: 1. Technically successful conversion of right nephrostomy catheterfor antegrade nephroureteral catheter under fluoroscopy, left capped to allow antegrade drainage. 2. Technically successful exchange of left nephrostomy catheter under fluoroscopy. Electronically Signed   By: Lucrezia Europe M.D.   On: 08/23/2018 16:58   Ir Nephrostomy Exchange Left  Result Date: 08/16/2018 INDICATION: Indwelling left-sided percutaneous nephrostomy tube has become completely dislodged and requires replacement. EXAM: REPLACEMENT OF LEFT PERCUTANEOUS NEPHROSTOMY TUBE UNDER FLUOROSCOPY COMPARISON:  08/09/2018  MEDICATIONS: None ANESTHESIA/SEDATION: Fentanyl 100 mcg IV; Versed 3.0 mg IV Moderate Sedation Time:  10 minutes. The patient was continuously monitored during the procedure by the interventional radiology nurse under my direct supervision.  CONTRAST:  5 mL Isovue-300-administered into the collecting system(s) FLUOROSCOPY TIME:  Fluoroscopy Time: 54 seconds.  5.2 mGy. COMPLICATIONS: None immediate. PROCEDURE: Informed written consent was obtained from the patient after a thorough discussion of the procedural risks, benefits and alternatives. All questions were addressed. Maximal Sterile Barrier Technique was utilized including caps, mask, sterile gowns, sterile gloves, sterile drape, hand hygiene and skin antiseptic. A timeout was performed prior to the initiation of the procedure. A 5 French catheter was advanced through the pre-existing left percutaneous nephrostomy tube tract. Diluted contrast was injected to confirm positioning within the renal collecting system. The catheter was removed over a guidewire. The percutaneous tract was dilated with both 10 [place] and 12 [place] dilators. A new 12 French percutaneous nephrostomy tube was then advanced over the guidewire and formed. The catheter was injected with diluted contrast to confirm position and a fluoroscopic spot image obtained. The catheter was secured at the skin exit site with a Prolene retention suture and attached to a gravity drainage bag. FINDINGS: The percutaneous tract was catheterized to the level of the renal collecting system. The tract had to be dilated in order to allow advancement of a new nephrostomy tube. The new 13 French catheter was formed in the renal pelvis. IMPRESSION: Replacement of dislodged left-sided nephrostomy tube with new 12 French nephrostomy tube formed in the renal pelvis. This catheter was attached to gravity bag drainage. Electronically Signed   By: Aletta Edouard M.D.   On: 08/16/2018 11:05   Ir Nephrostomy Exchange Left  Result Date: 08/09/2018 INDICATION: Bladder cancer, chronic nephrostomy, routine exchange EXAM: FLUOROSCOPIC EXCHANGE OF THE BILATERAL NEPHROSTOMIES Date:  08/09/2018 08/09/2018 2:39 pm Radiologist:  M. Daryll Brod, MD Guidance:   Fluoroscopic FLUOROSCOPY TIME:  0 minutes 30 seconds (5 mGy). MEDICATIONS: 1% lidocaine local ANESTHESIA/SEDATION: Moderate (conscious) sedation was employed during this procedure. A total of Versed 2 mg and Fentanyl 100 mcg was administered intravenously. Moderate Sedation Time: 10 minutes. The patient's level of consciousness and vital signs were monitored continuously by radiology nursing throughout the procedure under my direct supervision. CONTRAST:  20 cc [id number] COMPLICATIONS: None immediate. PROCEDURE: Informed consent was obtained from the patient following explanation of the procedure, risks, benefits and alternatives. The patient understands, agrees and consents for the procedure. All questions were addressed. A time out was performed. Maximal barrier sterile technique utilized including caps, mask, sterile gowns, sterile gloves, large sterile drape, hand hygiene, and ChloraPrep. Under sterile conditions and local anesthesia, the existing nephrostomy catheters were injected to confirm position in the renal pelvis bilaterally. Nephrostomy catheters were cut and exchanged successfully over Amplatz guidewires. Contrast injection reconfirms position. Images obtained for documentation. Catheter secured with Prolene sutures and connected to external gravity drainage bag. Sterile dressing applied. No immediate complication. Patient tolerated the procedure well. IMPRESSION: Successful fluoroscopic exchange of the bilateral nephrostomy catheters Electronically Signed   By: Jerilynn Mages.  Shick M.D.   On: 08/09/2018 14:49   Ir Nephrostomy Exchange Right  Result Date: 08/19/2018 INDICATION: Hydronephrosis, accidental removal of the right nephrostomy catheter, urinary retention EXAM: Fluoroscopic right nephrostomy tube replacement through the existing tract Ultrasound and fluoroscopic suprapubic catheter insertion Date:  08/19/2018 08/19/2018 1:39 pm Radiologist:  Jerilynn Mages. Daryll Brod, MD Guidance:  Ultrasound and fluoroscopy  COMPARISON:  04/11/2018 FLUOROSCOPY TIME:  Two  minutes 36 seconds (68 mGy). COMPLICATIONS: None immediate. MEDICATIONS: 1% lidocaine local ANESTHESIA/SEDATION: A total of Versed 2.0 mg and Fentanyl 100 mcg was administered intravenously. Moderate Sedation Time: 30 minutes. The patient's level of consciousness and vital signs were monitored continuously by radiology nursing throughout the procedure under my direct supervision. CONTRAST:  20 cc PROCEDURE: Informed written consent was obtained from the patient after a thorough discussion of the procedural risks, benefits and alternatives. All questions were addressed. Maximal Sterile Barrier Technique was utilized including caps, mask, sterile gowns, sterile gloves, sterile drape, hand hygiene and skin antiseptic. A timeout was performed prior to the initiation of the procedure. Right nephrostomy replacement: Under sterile conditions and local anesthesia, the existing right nephrostomy percutaneous tract was cannulated with a 5 French catheter. Contrast injection demonstrates the small patent percutaneous tract. There is also surrounding retroperitoneal extravasation noted. Eventually the glidewire and catheter were advanced into the collecting system and ureter. Contrast injection confirms position within the collecting system. Amplatz guidewire inserted followed by a 10 French nephrostomy with the retention loop formed the renal pelvis. Position confirmed with fluoroscopy and contrast injection. Images obtained for documentation. Catheter secured with a Prolene suture and a sterile dressing. Gravity drainage bag connected. No immediate complication. Patient tolerated the procedure well. Suprapubic catheter insertion: Patient was repositioned supine. Preliminary ultrasound performed. Distended bladder localized in the midline. Images obtained for documentation. Under sterile conditions and local anesthesia, an 18 gauge introducer needle was advanced under direct  ultrasound from a midline anterior approach into the bladder. There was return of urine. Amplatz guidewire inserted followed by tract dilatation to insert a 14 French catheter. Catheter position confirmed within the bladder with ultrasound and fluoroscopy. Images obtained for documentation. Catheter secured with Prolene suture and connected to external gravity drainage bag. Sterile dressing applied. No immediate complication. Patient tolerated the procedure well. IMPRESSION: Successful right nephrostomy replacement through the existing percutaneous tract. Successful fluoroscopic and ultrasound placement of a new suprapubic catheter (14 [place]). Electronically Signed   By: Jerilynn Mages.  Shick M.D.   On: 08/19/2018 14:15   Ir Nephrostomy Exchange Right  Result Date: 08/09/2018 INDICATION: Bladder cancer, chronic nephrostomy, routine exchange EXAM: FLUOROSCOPIC EXCHANGE OF THE BILATERAL NEPHROSTOMIES Date:  08/09/2018 08/09/2018 2:39 pm Radiologist:  M. Daryll Brod, MD Guidance:  Fluoroscopic FLUOROSCOPY TIME:  0 minutes 30 seconds (5 mGy). MEDICATIONS: 1% lidocaine local ANESTHESIA/SEDATION: Moderate (conscious) sedation was employed during this procedure. A total of Versed 2 mg and Fentanyl 100 mcg was administered intravenously. Moderate Sedation Time: 10 minutes. The patient's level of consciousness and vital signs were monitored continuously by radiology nursing throughout the procedure under my direct supervision. CONTRAST:  20 cc [id number] COMPLICATIONS: None immediate. PROCEDURE: Informed consent was obtained from the patient following explanation of the procedure, risks, benefits and alternatives. The patient understands, agrees and consents for the procedure. All questions were addressed. A time out was performed. Maximal barrier sterile technique utilized including caps, mask, sterile gowns, sterile gloves, large sterile drape, hand hygiene, and ChloraPrep. Under sterile conditions and local anesthesia, the  existing nephrostomy catheters were injected to confirm position in the renal pelvis bilaterally. Nephrostomy catheters were cut and exchanged successfully over Amplatz guidewires. Contrast injection reconfirms position. Images obtained for documentation. Catheter secured with Prolene sutures and connected to external gravity drainage bag. Sterile dressing applied. No immediate complication. Patient tolerated the procedure well. IMPRESSION: Successful fluoroscopic exchange of the bilateral nephrostomy catheters Electronically Signed   By: Jerilynn Mages.  Shick M.D.   On:  08/09/2018 14:49    Microbiology: Recent Results (from the past 240 hour(s))  Urine culture     Status: Abnormal   Collection Time: 08/19/18 12:05 AM  Result Value Ref Range Status   Specimen Description   Final    URINE, RANDOM NEPHROSTOMY BAG LEFT KIDNEY Performed at [facility], [address] [place], [place] [address]    Special Requests   Final    NONE Performed at [facility], [address] [facility], [place] [address]    Culture (A)  Final    >=100,000 COLONIES/mL METHICILLIN RESISTANT STAPHYLOCOCCUS AUREUS   Report Status 08/21/2018 FINAL  Final   Organism ID, Bacteria METHICILLIN RESISTANT STAPHYLOCOCCUS AUREUS (A)  Final      Susceptibility   Methicillin resistant staphylococcus aureus - MIC*    CIPROFLOXACIN >=8 RESISTANT Resistant     GENTAMICIN <=0.5 SENSITIVE Sensitive     NITROFURANTOIN <=16 SENSITIVE Sensitive     OXACILLIN >=4 RESISTANT Resistant     TETRACYCLINE <=1 SENSITIVE Sensitive     VANCOMYCIN <=0.5 SENSITIVE Sensitive     TRIMETH/SULFA <=10 SENSITIVE Sensitive     CLINDAMYCIN <=0.25 SENSITIVE Sensitive     RIFAMPIN <=0.5 SENSITIVE Sensitive     Inducible Clindamycin NEGATIVE Sensitive     * >=100,000 COLONIES/mL METHICILLIN RESISTANT STAPHYLOCOCCUS AUREUS  Blood culture (routine x 2)     Status: None   Collection Time: 08/19/18 12:50 AM  Result Value Ref Range Status    Specimen Description   Final    BLOOD LEFT FOREARM Performed at [facility], [address] [place], [place] [address]    Special Requests   Final    BOTTLES DRAWN AEROBIC AND ANAEROBIC Blood Culture adequate volume Performed at [facility], [address] [place], [place] [address]    Culture   Final    NO GROWTH 5 DAYS Performed at [facility], [address], [place], [place] [address]    Report Status 08/24/2018 FINAL  Final  Blood culture (routine x 2)     Status: None   Collection Time: 08/19/18 12:50 AM  Result Value Ref Range Status   Specimen Description   Final    BLOOD RIGHT FOREARM Performed at [facility], [address] [place], [place] [address]    Special Requests   Final    BOTTLES DRAWN AEROBIC AND ANAEROBIC Blood Culture adequate volume Performed at [facility], [address] [place], [place] [address]    Culture   Final    NO GROWTH 5 DAYS Performed at [facility], [place] [address] [place], [place] [address]    Report Status 08/24/2018 FINAL  Final  MRSA PCR Screening     Status: Abnormal   Collection Time: 08/19/18  2:57 AM  Result Value Ref Range Status   MRSA by PCR POSITIVE (A) NEGATIVE Final    Comment:        The GeneXpert MRSA Assay (FDA approved for NASAL specimens only), is one component of a comprehensive MRSA colonization surveillance program. It is not intended to diagnose MRSA infection nor to guide or monitor treatment for MRSA infections. RESULT CALLED TO, READ BACK BY AND VERIFIED WITH: Alcide Clever [id number] @ [address] Performed at [facility] [address], [place], [place] [address]      Labs: Basic Metabolic Panel: Recent Labs  Lab  08/19/18 0813 08/20/18 0335 08/21/18 1019 08/23/18 0403 08/24/18 0558  NA 134* 137 134* 138 138  K 4.0 4.2 3.7 3.6 3.9  CL 105 109 107 106 105  CO2 17* 19* 21* 24 26  GLUCOSE 136* 117*  126* 108* 98  BUN 32* 29* 23 30* 36*  CREATININE 1.77* 1.65* 1.25* 1.35* 1.53*  CALCIUM 9.3 9.0 9.3 9.3 9.2   Liver Function Tests: No results for input(s): AST, ALT, ALKPHOS, BILITOT, PROT, ALBUMIN in the last 168 hours. No results for input(s): LIPASE, AMYLASE in the last 168 hours. No results for input(s): AMMONIA in the last 168 hours. CBC: Recent Labs  Lab 08/18/18 2331 08/19/18 0813 08/20/18 0335 08/21/18 1019 08/23/18 0403 08/24/18 0558  WBC 10.8* 11.3* 8.2 5.7 5.3 5.3  NEUTROABS 8.4* 9.8*  --   --  3.3  --   HGB 10.7* 10.5* 9.8* 9.7* 9.8* 10.3*  HCT 30.4* 30.5* 28.6* 28.5* 28.7* 30.4*  MCV 92.4 92.1 94.4 93.4 91.7 93.0  PLT 55* 62* 47* 53* 58* 66*   Cardiac Enzymes: No results for input(s): CKTOTAL, CKMB, CKMBINDEX, TROPONINI in the last 168 hours. BNP: BNP (last 3 results) Recent Labs    01/31/18 0153  BNP 1,472.0*    ProBNP (last 3 results) No results for input(s): PROBNP in the last 8760 hours.  CBG: No results for input(s): GLUCAP in the last 168 hours.     Signed:  Nita Sells MD   [facility] 08/24/2018, 2:04 PM

## 2018-08-24 NOTE — Evaluation (Signed)
Physical Therapy Evaluation Patient Details Name: Charles Coffey MRN: [id number] DOB: Mar 27, 1935 Today's Date: 08/24/2018   History of Present Illness  Pt is an 83 y.o. male with PMH of hypertension, hyperlipidemia, CAD s/p MI, RLS, stage 4, bladder cancer s/p resection, chemo, and bilateral nephrostomy tube  and diverting colostomy admitted 08/18/18  with suprapubic and back pain in addition to dislodged right nephrostomy tube and also found to meet sepsis criteria.  S/P replacement of the dislodged nephrostomy tube and placement of an SP tube  Clinical Impression  The patient  Ambulated x 600' pushing IV pole. Patient also up without need for UE support in room. Patient should progress to return home with supervision. Pt admitted with above diagnosis. Pt currently with functional limitations due to the deficits listed below (see PT Problem List).  Pt will benefit from skilled PT to increase their independence and safety with mobility to allow discharge to the venue listed below.       Follow Up Recommendations No PT follow up    Equipment Recommendations  None recommended by PT    Recommendations for Other Services       Precautions / Restrictions Precautions Precaution Comments: multiple drains, colostomy Restrictions Weight Bearing Restrictions: No      Mobility  Bed Mobility               General bed mobility comments: up in chair  Transfers Overall transfer level: Needs assistance Equipment used: None Transfers: Sit to/from Stand Sit to Stand: Supervision            Ambulation/Gait Ambulation/Gait assistance: Supervision Gait Distance (Feet): 600 Feet   Gait Pattern/deviations: Step-through pattern     General Gait Details: pushed IV pole. able to ambulate without IV pole in room  Stairs            Wheelchair Mobility    Modified Rankin (Stroke Patients Only)       Balance Overall balance assessment: Needs assistance Sitting-balance  support: No upper extremity supported;Feet unsupported Sitting balance-Leahy Scale: Normal     Standing balance support: During functional activity;No upper extremity supported Standing balance-Leahy Scale: Good                               Pertinent Vitals/Pain Pain Assessment: No/denies pain    Home Living Family/patient expects to be discharged to:: Private residence Living Arrangements: Spouse/significant other Available Help at Discharge: Family;Available 24 hours/day Type of Home: House Home Access: Level entry     Home Layout: Two level;Able to live on main level with bedroom/bathroom Home Equipment: None      Prior Function Level of Independence: Independent               Hand Dominance        Extremity/Trunk Assessment   Upper Extremity Assessment Upper Extremity Assessment: Generalized weakness    Lower Extremity Assessment Lower Extremity Assessment: Generalized weakness    Cervical / Trunk Assessment Cervical / Trunk Assessment: Normal  Communication   Communication: No difficulties  Cognition Arousal/Alertness: Awake/alert Behavior During Therapy: WFL for tasks assessed/performed Overall Cognitive Status: No family/caregiver present to determine baseline cognitive functioning                                 General Comments: grossly [facility]      General Comments  Exercises     Assessment/Plan    PT Assessment Patient needs continued PT services  PT Problem List Decreased mobility       PT Treatment Interventions Gait training;Stair training;Cognitive remediation    PT Goals (Current goals can be found in the Care Plan section)  Acute Rehab PT Goals Patient Stated Goal: to go home PT Goal Formulation: With patient Time For Goal Achievement: 08/31/18 Potential to Achieve Goals: Good    Frequency Min 2X/week   Barriers to discharge        Co-evaluation               AM-PAC PT "6  Clicks" Daily Activity  Outcome Measure Difficulty turning over in bed (including adjusting bedclothes, sheets and blankets)?: None Difficulty moving from lying on back to sitting on the side of the bed? : None Difficulty sitting down on and standing up from a chair with arms (e.g., wheelchair, bedside commode, etc,.)?: A Little Help needed moving to and from a bed to chair (including a wheelchair)?: A Little Help needed walking in hospital room?: A Little Help needed climbing 3-5 steps with a railing? : A Little 6 Click Score: 20    End of Session Equipment Utilized During Treatment: Gait belt Activity Tolerance: Patient tolerated treatment well Patient left: in chair;with call bell/phone within reach Nurse Communication: Mobility status PT Visit Diagnosis: Unsteadiness on feet (R26.81)    Time: 1003-1016 PT Time Calculation (min) (ACUTE ONLY): 13 min   Charges:   PT Evaluation $PT Eval Low Complexity: [place] PT [phone]   [name] 08/24/2018, 10:35 AM

## 2018-08-24 NOTE — Care Management Note (Signed)
Case Management Note  Patient Details  Name: ETHON WYMER MRN: [id number] Date of Birth: 05/13/1935  Subjective/Objective: Per palliative recc-[facility] liason answered questions of their services to patient/spouse-outcome-patient/spouse want to continue with [facility] HHRN-currently when d/c home,then f/u if needed with [facility]-they have the brochure to call. MD updated. No further CM needs.                   Action/Plan:d/c home w/HHC   Expected Discharge Date:                  Expected Discharge Plan:  [place]  In-House Referral:     Discharge planning Services  CM Consult  Post Acute Care Choice:  Home Health(Active wAH-HHRN-ostomy instruction/dsg change. weekly) Choice offered to:     DME Arranged:    DME Agency:     HH Arranged:  RN [place] Agency:  [place]  Status of Service:  Completed, signed off  If discussed at [place] of Stay Meetings, dates discussed:    Additional Comments:  Dessa Phi, RN 08/24/2018, 12:55 PM

## 2018-08-25 ENCOUNTER — Encounter: Payer: Self-pay | Admitting: General Practice

## 2018-08-25 ENCOUNTER — Telehealth: Payer: Self-pay | Admitting: *Deleted

## 2018-08-25 DIAGNOSIS — C679 Malignant neoplasm of bladder, unspecified: Secondary | ICD-10-CM | POA: Diagnosis not present

## 2018-08-25 DIAGNOSIS — Z436 Encounter for attention to other artificial openings of urinary tract: Secondary | ICD-10-CM | POA: Diagnosis not present

## 2018-08-25 DIAGNOSIS — N390 Urinary tract infection, site not specified: Secondary | ICD-10-CM | POA: Diagnosis not present

## 2018-08-25 DIAGNOSIS — D649 Anemia, unspecified: Secondary | ICD-10-CM | POA: Diagnosis not present

## 2018-08-25 DIAGNOSIS — R410 Disorientation, unspecified: Secondary | ICD-10-CM | POA: Diagnosis not present

## 2018-08-25 DIAGNOSIS — I130 Hypertensive heart and chronic kidney disease with heart failure and stage 1 through stage 4 chronic kidney disease, or unspecified chronic kidney disease: Secondary | ICD-10-CM | POA: Diagnosis not present

## 2018-08-25 DIAGNOSIS — N183 Chronic kidney disease, stage 3 (moderate): Secondary | ICD-10-CM | POA: Diagnosis not present

## 2018-08-25 DIAGNOSIS — K624 Stenosis of anus and rectum: Secondary | ICD-10-CM | POA: Diagnosis not present

## 2018-08-25 DIAGNOSIS — G893 Neoplasm related pain (acute) (chronic): Secondary | ICD-10-CM

## 2018-08-25 DIAGNOSIS — D696 Thrombocytopenia, unspecified: Secondary | ICD-10-CM | POA: Diagnosis not present

## 2018-08-25 DIAGNOSIS — Z933 Colostomy status: Secondary | ICD-10-CM | POA: Diagnosis not present

## 2018-08-25 DIAGNOSIS — B964 Proteus (mirabilis) (morganii) as the cause of diseases classified elsewhere: Secondary | ICD-10-CM | POA: Diagnosis not present

## 2018-08-25 DIAGNOSIS — T83022D Displacement of nephrostomy catheter, subsequent encounter: Secondary | ICD-10-CM | POA: Diagnosis not present

## 2018-08-25 NOTE — Telephone Encounter (Signed)
Per Dr. [name], I gave a verbal order to Levada Dy to continue nursing orders. She verbalized understanding.

## 2018-08-25 NOTE — Progress Notes (Signed)
[place] Spiritual Care Note  Spouse Charles Coffey's vm is full, but I will continue to try to reach her next week for further support.   [place], [place], [facility] Pager [phone] Voicemail [phone]

## 2018-08-25 NOTE — Telephone Encounter (Signed)
Levada Dy from [facility] called with request for VO from Dr. [name] to resume nursing care for pt 1x per week times 2 weeks.  Will review with MD.

## 2018-08-25 NOTE — Telephone Encounter (Signed)
-----   Message from Lucile Crater, RN sent at 08/25/2018  4:16 PM EDT ----- Regarding: VO needed for [facility]   Charles Coffey  Please call from Levada Dy at [facility] [phone] with a VO from Dr. [name] for pt to continue nursing care 1x week for 2 weeks.   Pt recently discharged from Hospital.  Thanks, [facility]

## 2018-08-26 ENCOUNTER — Ambulatory Visit: Payer: Medicare HMO | Admitting: Oncology

## 2018-08-26 ENCOUNTER — Ambulatory Visit: Payer: Medicare HMO

## 2018-08-26 ENCOUNTER — Other Ambulatory Visit: Payer: Medicare HMO

## 2018-08-30 ENCOUNTER — Other Ambulatory Visit: Payer: Self-pay

## 2018-08-30 ENCOUNTER — Encounter: Payer: Self-pay | Admitting: Medical

## 2018-08-30 ENCOUNTER — Other Ambulatory Visit: Payer: Self-pay | Admitting: Medical

## 2018-08-30 DIAGNOSIS — D649 Anemia, unspecified: Secondary | ICD-10-CM | POA: Diagnosis not present

## 2018-08-30 DIAGNOSIS — I130 Hypertensive heart and chronic kidney disease with heart failure and stage 1 through stage 4 chronic kidney disease, or unspecified chronic kidney disease: Secondary | ICD-10-CM | POA: Diagnosis not present

## 2018-08-30 DIAGNOSIS — T83022D Displacement of nephrostomy catheter, subsequent encounter: Secondary | ICD-10-CM | POA: Diagnosis not present

## 2018-08-30 DIAGNOSIS — R410 Disorientation, unspecified: Secondary | ICD-10-CM | POA: Diagnosis not present

## 2018-08-30 DIAGNOSIS — K435 Parastomal hernia without obstruction or  gangrene: Secondary | ICD-10-CM | POA: Diagnosis not present

## 2018-08-30 DIAGNOSIS — D696 Thrombocytopenia, unspecified: Secondary | ICD-10-CM | POA: Diagnosis not present

## 2018-08-30 DIAGNOSIS — B964 Proteus (mirabilis) (morganii) as the cause of diseases classified elsewhere: Secondary | ICD-10-CM | POA: Diagnosis not present

## 2018-08-30 DIAGNOSIS — Z436 Encounter for attention to other artificial openings of urinary tract: Secondary | ICD-10-CM | POA: Diagnosis not present

## 2018-08-30 DIAGNOSIS — N183 Chronic kidney disease, stage 3 (moderate): Secondary | ICD-10-CM | POA: Diagnosis not present

## 2018-08-30 DIAGNOSIS — R109 Unspecified abdominal pain: Secondary | ICD-10-CM

## 2018-08-30 DIAGNOSIS — C679 Malignant neoplasm of bladder, unspecified: Secondary | ICD-10-CM | POA: Diagnosis not present

## 2018-08-30 DIAGNOSIS — N390 Urinary tract infection, site not specified: Secondary | ICD-10-CM | POA: Diagnosis not present

## 2018-08-30 DIAGNOSIS — Z933 Colostomy status: Secondary | ICD-10-CM | POA: Diagnosis not present

## 2018-08-30 NOTE — Patient Outreach (Signed)
[place] [facility]) Care Management  08/30/2018  YAMA NIELSON 04-Sep-1935 [id number]   EMMI- General Discharge RED ON EMMI ALERT Day # 1 Date:  08/26/18 Red Alert Reason:  Questions about discharge papers? Yes  Scheduled follow-up? No  Other questions/problems? Yes   Outreach attempt: spoke with wife. She states that home health nurse is present and will call back later.     Plan: RN CM will wait return call from wife.  If no return call will send letter and attempt again within 4 business days.  Jone Baseman, RN, MSN [facility] Care Management Care Management Coordinator Direct Line [phone] Toll Free: [phone]  Fax: [phone]

## 2018-08-31 ENCOUNTER — Other Ambulatory Visit: Payer: Self-pay

## 2018-08-31 NOTE — Patient Outreach (Signed)
[facility] [facility]) Care Management  08/31/2018  ACHILLES NEVILLE 1935-01-28 [id number]   EMMI- General Discharge RED ON EMMI ALERT Day # 1 Date: 08/26/18 Red Alert Reason: Questions about discharge papers? Yes  Scheduled follow-up? No  Other questions/problems? Yes   Outreach attempt: spoke with wife. She states that she cannot talk with CM right now.  Advised that CM would attempt call another time.  Plan: RN CM will attempt patient again within 4 business days and send letter.  Jone Baseman, RN, MSN [place] [facility] Care Management Coordinator Direct Line [phone] Cell [phone] Toll Free: [phone]  Fax: [phone]

## 2018-09-01 ENCOUNTER — Other Ambulatory Visit: Payer: Self-pay | Admitting: Pharmacist

## 2018-09-01 ENCOUNTER — Encounter: Payer: Self-pay | Admitting: General Practice

## 2018-09-01 ENCOUNTER — Telehealth: Payer: Self-pay | Admitting: Pharmacist

## 2018-09-01 ENCOUNTER — Other Ambulatory Visit: Payer: Self-pay

## 2018-09-01 NOTE — Patient Outreach (Signed)
[facility]) Care Management  09/01/2018  Charles Coffey 03-29-1935 [id number]   EMMI-General Discharge RED ON EMMI ALERT Day #1 Date:08/26/18 Red Alert Reason: Questions about discharge papers? Yes  Scheduled follow-up? No  Other questions/problems? Yes   Outreach attempt: spoke with wife [name].  She is able to verify HIPAA.  She states that patient is doing good.  Discussed red alerts.  She states she has some questions and concerns about his medications and has a call out to the oncologist.  She states that while patient in the hospital he had changes with his medications and were not aware.  She is requesting pharmacy contact to assist with medication questions and reconciliation.  She states she had [facility] pharmacist again.  Discussed with her other [facility] services.  She has deferred those services for now.  Advised her if she thinks other services are needed she can let the pharmacist know and she can make the referral. She verbalized understanding.    Plan: RN CM will refer to pharmacy for medication management.    Jone Baseman, RN, MSN [place] Management Care Management Coordinator Direct Line [phone] Cell [phone] Toll Free: [phone]  Fax: [phone]

## 2018-09-01 NOTE — Patient Outreach (Signed)
[facility]) Care Management  09/01/2018  ASHISH ROSSETTI 07/05/1935 [id number]   Patient was called per referral about medication questions post discharge. Patient answered the phone and said that his wife was expecting my call. HIPAA identifiers were obtained. Called the patient's wife on the number listed for her. Unfortunately, she did not answer the phone. HIPAA compliant message was left on her voicemail.   Plan: Call patient back in 3-5 business days.   Elayne Guerin, PharmD, [facility] Clinical Pharmacist [phone]

## 2018-09-01 NOTE — Progress Notes (Signed)
[place] Spiritual Care Note  LVM for [name] and [name] to offer care and support, encouraging them to call anytime.   [place], [place], [facility] Pager [phone] Voicemail [phone]

## 2018-09-02 ENCOUNTER — Encounter: Payer: Self-pay | Admitting: Oncology

## 2018-09-02 ENCOUNTER — Other Ambulatory Visit: Payer: Self-pay | Admitting: Family Medicine

## 2018-09-02 NOTE — Patient Outreach (Signed)
[facility]) Care Management  [place]   09/02/2018  Charles Coffey [birth year] [id number]  Subjective: Patient was referred to [facility] Pharmacist per request during a transitional care phone call by his wife Charles Coffey).  Charles Coffey was called. HIPAA identifiers were obtained.  Patient is an 83 year old male with multiple medical conditions including but not limited to:  coronary artery disease status post CABG in 2002, chronic combined systolic & diastolic CHF, chronic kidney disease stage III, chronic anemia and thrombocytopenia, chronic back pain, and urothelial carcinoma of the bladder with nephrostomy tube placement with secondary nephrostomy tube issues. Patient has been hospitalized multiple times due to nephrostomy tube placement and issues with the tube being removed or falling out.  He was most recently hospitalized 08/18/18 due to suprapubic and back pain due to his right tube falling out.  He was also treated for a UTI.   Objective:   Encounter Medications: Outpatient Encounter Medications as of 09/01/2018  Medication Sig Note  . DULoxetine (CYMBALTA) 20 MG capsule Take 1 capsule (20 mg total) by mouth 2 (two) times daily. Take once daily for 2 weeks then increase to twice daily (Patient taking differently: Take 20 mg by mouth See admin instructions. Take 20 mg by mouth once daily for 2 weeks then increase to twice daily) 08/15/2018: Started 20 mg once daily on 08/12/18  . feeding supplement (BOOST HIGH PROTEIN) LIQD Take 1 Container by mouth 2 (two) times daily between meals.   . furosemide (LASIX) 40 MG tablet Take 1 tablet (40 mg total) by mouth 2 (two) times daily. (Patient taking differently: Take 40 mg by mouth as needed for fluid or edema. )   . hydrOXYzine (ATARAX/VISTARIL) 25 MG tablet Take 1 tablet (25 mg total) by mouth every 4 (four) hours as needed for itching.   . lidocaine-prilocaine (EMLA) cream Apply 1 application topically as needed. (Patient taking  differently: Apply 1 application topically as needed (port access). )   . OLANZapine (ZYPREXA) 5 MG tablet Take 1 tablet (5 mg total) by mouth at bedtime.   Marland Kitchen oxyCODONE (OXYCONTIN) 10 mg 12 hr tablet Take 1 tablet (10 mg total) by mouth 2 (two) times daily. (Patient taking differently: Take 10 mg by mouth at bedtime. )   . oxyCODONE-acetaminophen (PERCOCET) 10-325 MG tablet Take 1 tablet by mouth every 4 (four) hours as needed for pain.   . pramipexole (MIRAPEX) 1 MG tablet TAKE 1 AND 1/2 TABLETS BY MOUTH AT BEDTIME   . ranitidine (ZANTAC) 300 MG tablet TAKE 1 TABLET BY MOUTH TWICE A DAY (Patient taking differently: Take 300 mg by mouth 2 (two) times daily. )   . Tetrahydroz-Glyc-Hyprom-PEG (VISINE MAXIMUM REDNESS RELIEF OP) Place 2 drops into both eyes 2 (two) times daily as needed (dry eyes).    . triamcinolone cream (KENALOG) 0.1 % Apply topically 3 (three) times daily. To affected areas   . [DISCONTINUED] QUEtiapine (SEROQUEL) 25 MG tablet TAKE 1 TABLET BY MOUTH EVERYDAY AT BEDTIME 08/15/2018: Temporarily on hold by MD  . carvedilol (COREG) 3.125 MG tablet Take 1 tablet (3.125 mg total) by mouth 2 (two) times daily.   Marland Kitchen LINZESS 290 MCG CAPS capsule TAKE 1 CAP BY MOUTH ONCE DAILY 30 MIN PRIOR TO A MEAL. MUST HAVE OFFICE VISIT FOR FURTHER REFILLS (Patient not taking: No sig reported) 09/01/2018: Patient has not taken in 3 weeks  . megestrol (MEGACE) 40 MG/ML suspension TAKE 10ML BY MOUTH TWICE A DAY (Patient not taking: No  sig reported) 09/01/2018: Not taking appetite has returned   . polyvinyl alcohol (LIQUIFILM TEARS) 1.4 % ophthalmic solution Place 1 drop into both eyes 2 (two) times daily as needed (dry eyes). (Patient not taking: Reported on 09/01/2018)   . [DISCONTINUED] doxycycline (VIBRA-TABS) 100 MG tablet Take 1 tablet (100 mg total) by mouth every 12 (twelve) hours. (Patient not taking: Reported on 09/01/2018)   . [DISCONTINUED] famotidine (PEPCID) 20 MG tablet Take 1 tablet (20 mg total) by mouth  2 (two) times daily. (Patient not taking: Reported on 09/01/2018)   . [DISCONTINUED] guaiFENesin-codeine 100-10 MG/5ML syrup Take 5 mLs by mouth every 6 (six) hours as needed for cough. (Patient not taking: Reported on 08/19/2018)   . [DISCONTINUED] Melatonin 3 MG CAPS Take 6 mg by mouth at bedtime as needed (sleep).   . [DISCONTINUED] mirabegron ER (MYRBETRIQ) 25 MG TB24 tablet Take 1 tablet (25 mg total) by mouth daily. 08/15/2018: Temporarily on hold by MD  . [DISCONTINUED] prochlorperazine (COMPAZINE) 10 MG tablet Take 1 tablet (10 mg total) by mouth every 6 (six) hours as needed for nausea or vomiting. (Patient not taking: Reported on 08/15/2018)    No facility-administered encounter medications on file as of 09/01/2018.     Functional Status: In your present state of health, do you have any difficulty performing the following activities: 08/20/2018 08/16/2018  Hearing? N N  Vision? N N  Difficulty concentrating or making decisions? Y N  Walking or climbing stairs? N N  Dressing or bathing? Y N  Doing errands, shopping? Y -  Conservation officer, nature and eating ? - -  Using the Toilet? - -  In the past six months, have you accidently leaked urine? - -  Do you have problems with loss of bowel control? - -  Managing your Medications? - -  Managing your Finances? - -  Housekeeping or managing your Housekeeping? - -  Some recent data might be hidden    Fall/Depression Screening: Fall Risk  07/08/2018 05/04/2018 06/11/2015  Falls in the past year? Yes No No  Number falls in past yr: 1 - -  Injury with Fall? Yes - -  Risk Factor Category  High Fall Risk - -  Risk for fall due to : History of fall(s);Impaired balance/gait;Mental status change - -  Follow up Education provided;Falls prevention discussed - -   PHQ 2/9 Scores 07/08/2018 06/11/2015  PHQ - 2 Score 3 0  PHQ- 9 Score 7 -      Assessment: Patient's medications were reviewed with this wife via telephone.  ASSESSMENT: Date Discharged from  Hospital: 08/24/2018 Date Medication Reconciliation Performed: 09/04/2018  Medications Discontinued at Discharge: (Delete if not applicable)  acetaminophen 500 MG tablet (TYLENOL)  KEYTRUDA 100 MG/4ML Soln  nitroGLYCERIN 0.4 MG SL tablet (NITROSTAT)  predniSONE 5 MG tablet (DELTASONE)  New Medications at Discharge: (delete if applicable)  Doxycycline  famotidine (returned to taking Ranitidine)  Olanzapine (ZYPREXA)  polyvinyl alcohol (returned to using Visine tears)  No new medications were prescribed at discharge.  Patient was recently discharged from hospital and all medications have been reviewed   Drugs sorted by system:  Neurologic/Psychologic: Duloxetine, Pramipexole, Olanzapine,   Cardiovascular: Carvedilol, Furosemide,   Pulmonary/Allergy: Hydroxyzine,   Gastrointestinal: Boost, Linzess, Megestrol, Ranitidine,   Topical: Emla cream, Triamcinolone cream  Pain: Oxycodone ER, Oxycodone-Acetaminophen,   Miscellaneous: Visine   PLAN: -Instructed patient to take new medications as prescribed and discontinue old medications as prescribed   -Patient's wife was instructed to call  the patient's PCP or Oncologist for a post-discharge office appointment.  -All of the patient's wife's questions about medications were answered.  She requested that I give her a call back in a few weeks.  Elayne Guerin, PharmD, [place] Clinical Pharmacist [phone]

## 2018-09-06 ENCOUNTER — Encounter: Payer: Self-pay | Admitting: Family Medicine

## 2018-09-06 ENCOUNTER — Ambulatory Visit (INDEPENDENT_AMBULATORY_CARE_PROVIDER_SITE_OTHER): Payer: Medicare HMO | Admitting: Family Medicine

## 2018-09-06 VITALS — BP 108/64 | HR 68 | Temp 98.3°F | Wt 185.0 lb

## 2018-09-06 DIAGNOSIS — I10 Essential (primary) hypertension: Secondary | ICD-10-CM | POA: Diagnosis not present

## 2018-09-06 DIAGNOSIS — Z23 Encounter for immunization: Secondary | ICD-10-CM

## 2018-09-06 DIAGNOSIS — N183 Chronic kidney disease, stage 3 unspecified: Secondary | ICD-10-CM

## 2018-09-06 DIAGNOSIS — C679 Malignant neoplasm of bladder, unspecified: Secondary | ICD-10-CM | POA: Diagnosis not present

## 2018-09-06 DIAGNOSIS — N12 Tubulo-interstitial nephritis, not specified as acute or chronic: Secondary | ICD-10-CM | POA: Diagnosis not present

## 2018-09-06 DIAGNOSIS — N390 Urinary tract infection, site not specified: Secondary | ICD-10-CM

## 2018-09-06 DIAGNOSIS — K439 Ventral hernia without obstruction or gangrene: Secondary | ICD-10-CM

## 2018-09-06 DIAGNOSIS — T83022A Displacement of nephrostomy catheter, initial encounter: Secondary | ICD-10-CM | POA: Diagnosis not present

## 2018-09-06 DIAGNOSIS — N1330 Unspecified hydronephrosis: Secondary | ICD-10-CM | POA: Diagnosis not present

## 2018-09-06 DIAGNOSIS — R410 Disorientation, unspecified: Secondary | ICD-10-CM

## 2018-09-06 DIAGNOSIS — A419 Sepsis, unspecified organism: Secondary | ICD-10-CM | POA: Diagnosis not present

## 2018-09-06 DIAGNOSIS — D696 Thrombocytopenia, unspecified: Secondary | ICD-10-CM | POA: Diagnosis not present

## 2018-09-06 DIAGNOSIS — I5022 Chronic systolic (congestive) heart failure: Secondary | ICD-10-CM

## 2018-09-06 LAB — BASIC METABOLIC PANEL
BUN: 40 mg/dL — ABNORMAL HIGH (ref 6–23)
CALCIUM: 9.5 mg/dL (ref 8.4–10.5)
CHLORIDE: 103 meq/L (ref 96–112)
CO2: 25 meq/L (ref 19–32)
CREATININE: 1.74 mg/dL — AB (ref 0.40–1.50)
GFR: 40.18 mL/min — ABNORMAL LOW (ref >60.00)
GLUCOSE: 89 mg/dL (ref 70–99)
Potassium: 4.2 mEq/L (ref 3.5–5.1)
Sodium: 136 mEq/L (ref 135–145)

## 2018-09-06 LAB — CBC WITH DIFFERENTIAL/PLATELET
BASOS ABS: 0.1 10*3/uL (ref 0.0–0.1)
Basophils Relative: 1.6 % (ref 0.0–3.0)
EOS ABS: 0.4 10*3/uL (ref 0.0–0.7)
Eosinophils Relative: 6.3 % — ABNORMAL HIGH (ref 0.0–5.0)
HEMATOCRIT: 27.7 % — AB (ref 39.0–52.0)
Hemoglobin: 9.5 g/dL — ABNORMAL LOW (ref 13.0–17.0)
LYMPHS ABS: 2 10*3/uL (ref 0.7–4.0)
LYMPHS PCT: 30.9 % (ref 12.0–46.0)
MCHC: 34.5 g/dL (ref 30.0–36.0)
MCV: 93.2 fl (ref 78.0–100.0)
MONOS PCT: 7.7 % (ref 3.0–12.0)
Monocytes Absolute: 0.5 10*3/uL (ref 0.1–1.0)
NEUTROS ABS: 3.5 10*3/uL (ref 1.4–7.7)
NEUTROS PCT: 53.5 % (ref 43.0–77.0)
PLATELETS: 58 10*3/uL — AB (ref 150.0–400.0)
RBC: 2.97 Mil/uL — ABNORMAL LOW (ref 4.22–5.81)
RDW: 18.3 % — AB (ref 11.5–15.5)
WBC: 6.6 10*3/uL (ref 4.0–10.5)

## 2018-09-06 NOTE — Progress Notes (Signed)
   Subjective:    Patient ID: Charles Coffey, male    DOB: [birth year], 83 y.o.   MRN: [id number]  HPI Here with his wife to follow up a hospital stay from 08-19-18 to 08-24-18 for sepsis related to a UTI and and for a dislodged left sided percutaneous nephrostomy tube. His urine culture grew MRSA and he was treated first with IV Vancomycin and was then sent home on Doxycycline. He has finished up the Doxycycline. The left sided nephrostomy was converted to an internal nephroureteral catheter and this seems to be functioning well. In the middle of all this he became very confused and combative and had to be restrained. He was started on Zyprexa and this has been helpful. Since coming home he feels more at peace and happier, and his wife agrees this has had a calming effect on him. He continues on Cymbalta as well. The main problem has been increased appetite, and he says he is hungry all the time and he is rapidly gaining weight. He has some LE edema and he is taking Lasix 40 mg bid. This seems to be stabilized. He has no pain in the feet or legs, and no SOB. He was having a lot of side effects from Thermal and he decided to stop taking it. He discussed this with Dr. [name], and he agreed.    Review of Systems  Constitutional: Positive for unexpected weight change. Negative for fever.  Respiratory: Negative.   Cardiovascular: Positive for leg swelling. Negative for chest pain and palpitations.  Gastrointestinal: Negative.   Neurological: Negative.   Psychiatric/Behavioral: Negative.        Objective:   Physical Exam  Constitutional: He is oriented to person, place, and time. He appears well-developed and well-nourished.  Cardiovascular: Normal rate, regular rhythm, normal heart sounds and intact distal pulses.  Pulmonary/Chest: Effort normal and breath sounds normal. No stridor. No respiratory distress. He has no wheezes. He has no rales.  Abdominal: Soft. Bowel sounds are normal. He exhibits  no distension. There is no tenderness. There is no rebound and no guarding.  Neurological: He is alert and oriented to person, place, and time.  Psychiatric: He has a normal mood and affect. His behavior is normal. Thought content normal.          Assessment & Plan:  He is doing fairly well in general. As for the bladder cancer, he will follow up with Dr. [name] on 09-23-18. He will see Dr. Roni Bread in a few weeks. He has recovered from a UTI. His LE edema is stable. We will check a CBC and a BMET today to follow his renal status. We agreed to decrease the Zyprexa to 1/2 tablet (2.5) at bedtime. His delirium has resolved and his moods are stable.  Alysia Penna, MD

## 2018-09-07 ENCOUNTER — Other Ambulatory Visit: Payer: Self-pay | Admitting: Internal Medicine

## 2018-09-07 DIAGNOSIS — I130 Hypertensive heart and chronic kidney disease with heart failure and stage 1 through stage 4 chronic kidney disease, or unspecified chronic kidney disease: Secondary | ICD-10-CM | POA: Diagnosis not present

## 2018-09-07 DIAGNOSIS — H524 Presbyopia: Secondary | ICD-10-CM | POA: Diagnosis not present

## 2018-09-07 DIAGNOSIS — Z436 Encounter for attention to other artificial openings of urinary tract: Secondary | ICD-10-CM | POA: Diagnosis not present

## 2018-09-07 DIAGNOSIS — H11153 Pinguecula, bilateral: Secondary | ICD-10-CM | POA: Diagnosis not present

## 2018-09-07 DIAGNOSIS — C679 Malignant neoplasm of bladder, unspecified: Secondary | ICD-10-CM | POA: Diagnosis not present

## 2018-09-07 DIAGNOSIS — H04123 Dry eye syndrome of bilateral lacrimal glands: Secondary | ICD-10-CM | POA: Diagnosis not present

## 2018-09-07 DIAGNOSIS — T83022D Displacement of nephrostomy catheter, subsequent encounter: Secondary | ICD-10-CM | POA: Diagnosis not present

## 2018-09-07 DIAGNOSIS — H2511 Age-related nuclear cataract, right eye: Secondary | ICD-10-CM | POA: Diagnosis not present

## 2018-09-07 DIAGNOSIS — H2513 Age-related nuclear cataract, bilateral: Secondary | ICD-10-CM | POA: Diagnosis not present

## 2018-09-07 DIAGNOSIS — H0288A Meibomian gland dysfunction right eye, upper and lower eyelids: Secondary | ICD-10-CM | POA: Diagnosis not present

## 2018-09-07 DIAGNOSIS — B964 Proteus (mirabilis) (morganii) as the cause of diseases classified elsewhere: Secondary | ICD-10-CM | POA: Diagnosis not present

## 2018-09-07 DIAGNOSIS — D649 Anemia, unspecified: Secondary | ICD-10-CM | POA: Diagnosis not present

## 2018-09-07 DIAGNOSIS — H02834 Dermatochalasis of left upper eyelid: Secondary | ICD-10-CM | POA: Diagnosis not present

## 2018-09-07 DIAGNOSIS — N183 Chronic kidney disease, stage 3 (moderate): Secondary | ICD-10-CM | POA: Diagnosis not present

## 2018-09-07 DIAGNOSIS — D696 Thrombocytopenia, unspecified: Secondary | ICD-10-CM | POA: Diagnosis not present

## 2018-09-07 DIAGNOSIS — H43393 Other vitreous opacities, bilateral: Secondary | ICD-10-CM | POA: Diagnosis not present

## 2018-09-07 DIAGNOSIS — N390 Urinary tract infection, site not specified: Secondary | ICD-10-CM | POA: Diagnosis not present

## 2018-09-07 DIAGNOSIS — R410 Disorientation, unspecified: Secondary | ICD-10-CM | POA: Diagnosis not present

## 2018-09-07 DIAGNOSIS — H0288B Meibomian gland dysfunction left eye, upper and lower eyelids: Secondary | ICD-10-CM | POA: Diagnosis not present

## 2018-09-07 DIAGNOSIS — H02831 Dermatochalasis of right upper eyelid: Secondary | ICD-10-CM | POA: Diagnosis not present

## 2018-09-08 ENCOUNTER — Encounter: Payer: Self-pay | Admitting: Family Medicine

## 2018-09-08 ENCOUNTER — Encounter: Payer: Self-pay | Admitting: Oncology

## 2018-09-09 ENCOUNTER — Telehealth: Payer: Self-pay | Admitting: *Deleted

## 2018-09-09 NOTE — Telephone Encounter (Signed)
Wife calling to ask if okay for patient to have cateract surgery? Per dr [name] okay, signed release from oncologist standpoint  faxed to [facility].

## 2018-09-14 ENCOUNTER — Other Ambulatory Visit: Payer: Self-pay | Admitting: Pharmacist

## 2018-09-14 DIAGNOSIS — K624 Stenosis of anus and rectum: Secondary | ICD-10-CM | POA: Diagnosis not present

## 2018-09-14 DIAGNOSIS — Z933 Colostomy status: Secondary | ICD-10-CM | POA: Diagnosis not present

## 2018-09-14 NOTE — Telephone Encounter (Signed)
Given worsening unilateral LE swelling, recent hospitalization, and prior infrapopliteal DVT, it is possible that thrombus could have propagated above the knee.  If this is the case, anticoagulation would need to be considered.  I recommend repeating lower extremity venous Duplex on the affected leg.  If negative, elevation and compression stocking use may be helpful  [name], MD [place] Pager: [phone]

## 2018-09-15 ENCOUNTER — Telehealth: Payer: Self-pay

## 2018-09-15 DIAGNOSIS — M7989 Other specified soft tissue disorders: Secondary | ICD-10-CM

## 2018-09-15 DIAGNOSIS — I824Z2 Acute embolism and thrombosis of unspecified deep veins of left distal lower extremity: Secondary | ICD-10-CM

## 2018-09-15 NOTE — Patient Outreach (Signed)
[facility]) Care Management  09/15/2018  Charles Coffey 09/26/1935 [id number]   Patient's wife [name]) called to let me know the patient's olanzapine dose was cut in half by his PCP. She wondered about Olanzapine side effects.  Olanzapine MOA and side effects were reviewed with the patient and his wife via speaker phone.  Plan: Will continue with previously planned follow up call for next week.   Elayne Guerin, PharmD, [place] Clinical Pharmacist [phone]

## 2018-09-15 NOTE — Telephone Encounter (Signed)
Spoke with patient's wife and informed her of Dr Darnelle Bos recommendation.  I will order left leg venous duplex.  They verbalized understanding.

## 2018-09-16 ENCOUNTER — Ambulatory Visit (HOSPITAL_COMMUNITY)
Admission: RE | Admit: 2018-09-16 | Discharge: 2018-09-16 | Disposition: A | Discharge status: Home / Self Care | Payer: Medicare HMO | Source: Ambulatory Visit | Attending: Internal Medicine | Admitting: Internal Medicine

## 2018-09-16 DIAGNOSIS — I824Z2 Acute embolism and thrombosis of unspecified deep veins of left distal lower extremity: Secondary | ICD-10-CM

## 2018-09-16 DIAGNOSIS — M7989 Other specified soft tissue disorders: Secondary | ICD-10-CM

## 2018-09-19 ENCOUNTER — Telehealth: Payer: Self-pay | Admitting: *Deleted

## 2018-09-19 ENCOUNTER — Other Ambulatory Visit: Payer: Self-pay | Admitting: Student

## 2018-09-19 ENCOUNTER — Telehealth: Payer: Self-pay

## 2018-09-19 DIAGNOSIS — R310 Gross hematuria: Secondary | ICD-10-CM | POA: Diagnosis not present

## 2018-09-19 DIAGNOSIS — C662 Malignant neoplasm of left ureter: Secondary | ICD-10-CM | POA: Diagnosis not present

## 2018-09-19 DIAGNOSIS — N130 Hydronephrosis with ureteropelvic junction obstruction: Secondary | ICD-10-CM | POA: Diagnosis not present

## 2018-09-19 DIAGNOSIS — C670 Malignant neoplasm of trigone of bladder: Secondary | ICD-10-CM | POA: Diagnosis not present

## 2018-09-19 NOTE — Telephone Encounter (Signed)
Wife left a message stating Charles Coffey was running a fever and had blood in his nephrostomy tube.  Called wife- she states his temperature is now normal and there is not any blood in tube. They are on the way to see the urologist.

## 2018-09-19 NOTE — Telephone Encounter (Signed)
Notes recorded by Frederik Schmidt, RN on 09/19/2018 at 11:11 AM EDT Informed patient's wife of [place] results/recommendations. She verbalized understanding. ------

## 2018-09-19 NOTE — Telephone Encounter (Signed)
-----   Message from [name], MD sent at 09/19/2018 10:55 AM EDT ----- Please let the patient know that there is no evidence of LLE DVT. He should elevate his legs and use compression stockings during the day as tolerated. If edema remains a problem, he should see his PCP or an APP in our office. Thanks.

## 2018-09-20 ENCOUNTER — Encounter (HOSPITAL_COMMUNITY): Payer: Self-pay

## 2018-09-20 ENCOUNTER — Other Ambulatory Visit: Payer: Self-pay | Admitting: Physician Assistant

## 2018-09-20 ENCOUNTER — Ambulatory Visit (HOSPITAL_COMMUNITY)
Admission: RE | Admit: 2018-09-20 | Discharge: 2018-09-20 | Disposition: A | Discharge status: Home / Self Care | Payer: Medicare HMO | Source: Ambulatory Visit | Attending: Interventional Radiology | Admitting: Interventional Radiology

## 2018-09-20 ENCOUNTER — Ambulatory Visit: Payer: Self-pay | Admitting: Surgery

## 2018-09-20 ENCOUNTER — Other Ambulatory Visit (HOSPITAL_COMMUNITY): Payer: Self-pay | Admitting: Interventional Radiology

## 2018-09-20 ENCOUNTER — Other Ambulatory Visit: Payer: Self-pay

## 2018-09-20 DIAGNOSIS — N183 Chronic kidney disease, stage 3 (moderate): Secondary | ICD-10-CM | POA: Insufficient documentation

## 2018-09-20 DIAGNOSIS — G2581 Restless legs syndrome: Secondary | ICD-10-CM | POA: Insufficient documentation

## 2018-09-20 DIAGNOSIS — M545 Low back pain: Secondary | ICD-10-CM | POA: Diagnosis not present

## 2018-09-20 DIAGNOSIS — I509 Heart failure, unspecified: Secondary | ICD-10-CM | POA: Diagnosis not present

## 2018-09-20 DIAGNOSIS — I252 Old myocardial infarction: Secondary | ICD-10-CM | POA: Insufficient documentation

## 2018-09-20 DIAGNOSIS — N1330 Unspecified hydronephrosis: Secondary | ICD-10-CM | POA: Insufficient documentation

## 2018-09-20 DIAGNOSIS — K589 Irritable bowel syndrome without diarrhea: Secondary | ICD-10-CM | POA: Insufficient documentation

## 2018-09-20 DIAGNOSIS — Z951 Presence of aortocoronary bypass graft: Secondary | ICD-10-CM | POA: Insufficient documentation

## 2018-09-20 DIAGNOSIS — I4510 Unspecified right bundle-branch block: Secondary | ICD-10-CM | POA: Diagnosis not present

## 2018-09-20 DIAGNOSIS — Z87891 Personal history of nicotine dependence: Secondary | ICD-10-CM | POA: Insufficient documentation

## 2018-09-20 DIAGNOSIS — T83022A Displacement of nephrostomy catheter, initial encounter: Secondary | ICD-10-CM

## 2018-09-20 DIAGNOSIS — Z8551 Personal history of malignant neoplasm of bladder: Secondary | ICD-10-CM | POA: Insufficient documentation

## 2018-09-20 DIAGNOSIS — E785 Hyperlipidemia, unspecified: Secondary | ICD-10-CM | POA: Insufficient documentation

## 2018-09-20 DIAGNOSIS — Z436 Encounter for attention to other artificial openings of urinary tract: Secondary | ICD-10-CM | POA: Diagnosis present

## 2018-09-20 DIAGNOSIS — I130 Hypertensive heart and chronic kidney disease with heart failure and stage 1 through stage 4 chronic kidney disease, or unspecified chronic kidney disease: Secondary | ICD-10-CM | POA: Insufficient documentation

## 2018-09-20 DIAGNOSIS — K219 Gastro-esophageal reflux disease without esophagitis: Secondary | ICD-10-CM | POA: Insufficient documentation

## 2018-09-20 DIAGNOSIS — I255 Ischemic cardiomyopathy: Secondary | ICD-10-CM | POA: Insufficient documentation

## 2018-09-20 DIAGNOSIS — G8929 Other chronic pain: Secondary | ICD-10-CM | POA: Insufficient documentation

## 2018-09-20 DIAGNOSIS — C689 Malignant neoplasm of urinary organ, unspecified: Secondary | ICD-10-CM | POA: Diagnosis not present

## 2018-09-20 DIAGNOSIS — I2510 Atherosclerotic heart disease of native coronary artery without angina pectoris: Secondary | ICD-10-CM | POA: Insufficient documentation

## 2018-09-20 DIAGNOSIS — Z933 Colostomy status: Secondary | ICD-10-CM | POA: Diagnosis not present

## 2018-09-20 DIAGNOSIS — K435 Parastomal hernia without obstruction or  gangrene: Secondary | ICD-10-CM | POA: Diagnosis not present

## 2018-09-20 HISTORY — PX: IR CONVERT LEFT NEPHROSTOMY TO NEPHROURETERAL CATH: IMG6067

## 2018-09-20 HISTORY — PX: IR EXT NEPHROURETERAL CATH EXCHANGE: IMG5418

## 2018-09-20 LAB — CBC
HEMATOCRIT: 25.2 % — AB (ref 39.0–52.0)
HEMOGLOBIN: 8.6 g/dL — AB (ref 13.0–17.0)
MCH: 31.9 pg (ref 26.0–34.0)
MCHC: 34.1 g/dL (ref 30.0–36.0)
MCV: 93.3 fL (ref 78.0–100.0)
PLATELETS: 73 10*3/uL — AB (ref 150–400)
RBC: 2.7 MIL/uL — AB (ref 4.22–5.81)
RDW: 17.1 % — ABNORMAL HIGH (ref 11.5–15.5)
WBC: 6.8 10*3/uL (ref 4.0–10.5)

## 2018-09-20 LAB — APTT: aPTT: 34 seconds (ref 24–36)

## 2018-09-20 LAB — PROTIME-INR
INR: 1.05
PROTHROMBIN TIME: 13.6 s (ref 11.4–15.2)

## 2018-09-20 MED ORDER — FENTANYL CITRATE (PF) 100 MCG/2ML IJ SOLN
INTRAMUSCULAR | Status: AC | PRN
  Administered 2018-09-20 (×5): 50 ug via INTRAVENOUS

## 2018-09-20 MED ORDER — MIDAZOLAM HCL 2 MG/2ML IJ SOLN
INTRAMUSCULAR | Status: AC
  Filled 2018-09-20: qty 4

## 2018-09-20 MED ORDER — MIDAZOLAM HCL 2 MG/2ML IJ SOLN
INTRAMUSCULAR | Status: AC
  Filled 2018-09-20: qty 2

## 2018-09-20 MED ORDER — SODIUM CHLORIDE 0.9% FLUSH: 5.0000 mL | Freq: Three times a day (TID) | INTRAVENOUS | Status: DC

## 2018-09-20 MED ORDER — FENTANYL CITRATE (PF) 100 MCG/2ML IJ SOLN
INTRAMUSCULAR | Status: AC
  Filled 2018-09-20: qty 2

## 2018-09-20 MED ORDER — MIDAZOLAM HCL 2 MG/2ML IJ SOLN
INTRAMUSCULAR | Status: AC | PRN
  Administered 2018-09-20 (×5): 1 mg via INTRAVENOUS

## 2018-09-20 MED ORDER — OXYCODONE-ACETAMINOPHEN 5-325 MG PO TABS
1.0000 | ORAL_TABLET | Freq: Once | ORAL | Status: AC
  Administered 2018-09-20: 1 via ORAL
  Filled 2018-09-20: qty 1

## 2018-09-20 MED ORDER — CIPROFLOXACIN IN D5W 400 MG/200ML IV SOLN
400.0000 mg | Freq: Once | INTRAVENOUS | Status: AC
  Administered 2018-09-20: 400 mg via INTRAVENOUS
  Filled 2018-09-20: qty 200

## 2018-09-20 MED ORDER — OXYCODONE HCL ER 10 MG PO T12A
10.0000 mg | EXTENDED_RELEASE_TABLET | Freq: Once | ORAL | Status: AC
  Administered 2018-09-20: 10 mg via ORAL

## 2018-09-20 MED ORDER — LIDOCAINE-EPINEPHRINE (PF) 2 %-1:200000 IJ SOLN
INTRAMUSCULAR | Status: AC
  Filled 2018-09-20: qty 20

## 2018-09-20 MED ORDER — SODIUM CHLORIDE 0.9 % IV SOLN
INTRAVENOUS | Status: DC
  Administered 2018-09-20: 12:00:00 via INTRAVENOUS

## 2018-09-20 MED ORDER — IOPAMIDOL (ISOVUE-300) INJECTION 61%
INTRAVENOUS | Status: AC
  Administered 2018-09-20: 20 mL
  Filled 2018-09-20: qty 50

## 2018-09-20 MED ORDER — KETOROLAC TROMETHAMINE 30 MG/ML IJ SOLN
7.5000 mg | Freq: Once | INTRAMUSCULAR | Status: AC
  Administered 2018-09-20: 7.5 mg via INTRAVENOUS
  Filled 2018-09-20: qty 1

## 2018-09-20 NOTE — Progress Notes (Signed)
Spoke with Dr. Pascal Lux, pt is complaining of 8/10 pain after giving the oxycodone at 1549. Per MD will give a one time dose of Percocet and will call pharmacy about Toradol dosing.   Per pharmacy pt is okay to have smallest dose of 7.5mg  of Toradol. Will put order in under Dr. Pascal Lux.   Will continue to monitor.

## 2018-09-20 NOTE — H&P (Signed)
Chief complaint: left PCN and right nephroureteral stent exchange.  Supervising Physician: Sandi Mariscal  Patient Status: Charles Coffey  History of Present Illness: Charles Coffey is a 83 y.o. male with a past medical history of CAD s/p CABG 2002, CHF, CKD III, anemia and urothelial carcinoma of the bladder with bilateral hydronephrosis and bilateral percutaneous nephrostomy tubes. Patient well known to IR, bilateral PCNs placed 11/2017 with several exchanges most recently left PCN was changed 8/20 by Dr. Kathlene Cote. Patient presented to [facility] ED on 8/22 with complaints of suprapubic pain, minimal urine output from left PCN and right PCN dislodgement. In ED foley or coude catheters could not be passed, patient had intractable pain with several doses of fentanyl. He was admitted for further evaluation - urology was consulted who advised not trying for further uretheral catheterization due to the type of cancer patient has. IR was consulted for replacement of right PCN and placement of suprapubic cathter at the same time was requested Dr. Matilde Sprang - both procedures performed by Dr. Annamaria Boots on 8/23.  IR was again consulted on 8/26 for conversion of right PCN to nephroureteral catheter and exchange of left PCN d/t possible infectious source per ID - both were performed on 8/27.   Patient's wife reports concern with suprapubic catheter, she states it appears infected to her and believes it needs to be exchanged today as well. She states Dr. [name] told her it would be exchanged today as well. She also reports left nephrostomy with scant urine output except for one day last week when she noted hematuria and patient with low grade fever - she states this was resolved later that day after taking an antibiotic and has not reoccurred. She states all insertion sites appear to be infected to her and she is very concerned because patient has had a lot of infections. Patient himself denies any complaints except that  he is concerned about travelling on an airplane with his colostomy bag.   Past Medical History:  Diagnosis Date  . Aortic atherosclerosis ([place])   . BPH with urinary obstruction   . CAD (coronary artery disease)    a.  MI 1995, CABG x 3 2002 (patient says that he had LIMA and RIMA grafts). b. ETT-Cardiolite (10/15) with EF 48%, apical scar, no ischemia. c. Nuc 07/2017 abnormal -> cath was performed,  patent LIMA to LAD and SVG to OM 2. RIMA to RCA is atretic. The right coronary artery is occluded distally with extensive collaterals from the LAD, medical therapy.   . Cancer [facility])    bladder cancer  . Cervical spondylosis without myelopathy 07-12-2016  . Chronic low back pain    sees Dr. Kary Kos   . GERD (gastroesophageal reflux disease)   . Hiatal hernia   . Hyperlipidemia   . Hypertension   . IBS (irritable bowel syndrome)   . Ischemic cardiomyopathy   . Myocardial infarction ([place])   . RBBB   . Restless legs syndrome   . Statin intolerance   . Syncope    a. in 2015 - no apparent cause, was taking sleep medicine at the time. Cardiac workup unremarkable.  . Tubular adenoma of colon     Past Surgical History:  Procedure Laterality Date  . COLONOSCOPY  10/17/2014   per Dr. Hilarie Fredrickson, tubular adenomas, repeat in 3 yrs   . COLONOSCOPY WITH PROPOFOL N/A 12/01/2017   Procedure: COLONOSCOPY WITH PROPOFOL;  Surgeon: Milus Banister, MD;  Location: [facility];  Service: Endoscopy;  Laterality: N/A;  . CYSTOSCOPY W/ RETROGRADES Left 11/25/2017   Procedure: CYSTOSCOPY WITH RETROGRADE PYELOGRAM;  Surgeon: Irine Seal, MD;  Location: [facility];  Service: Urology;  Laterality: Left;  . HEART BYPASS    . IR CONVERT RIGHT NEPHROSTOMY TO NEPHROURETERAL CATH  02/04/2018  . IR CONVERT RIGHT NEPHROSTOMY TO NEPHROURETERAL CATH  08/23/2018  . IR EXT NEPHROURETERAL CATH EXCHANGE  04/11/2018  . IR EXT NEPHROURETERAL CATH EXCHANGE  07/04/2018  . IR FLUORO GUIDE CV LINE RIGHT  12/27/2017  . IR FLUORO GUIDED  NEEDLE PLC ASPIRATION/INJECTION LOC  08/19/2018  . IR NEPHROSTOGRAM LEFT THRU EXISTING ACCESS  12/24/2017  . IR NEPHROSTOMY EXCHANGE LEFT  01/28/2018  . IR NEPHROSTOMY EXCHANGE LEFT  02/04/2018  . IR NEPHROSTOMY EXCHANGE LEFT  02/25/2018  . IR NEPHROSTOMY EXCHANGE LEFT  07/04/2018  . IR NEPHROSTOMY EXCHANGE LEFT  07/05/2018  . IR NEPHROSTOMY EXCHANGE LEFT  08/09/2018  . IR NEPHROSTOMY EXCHANGE LEFT  08/16/2018  . IR NEPHROSTOMY EXCHANGE LEFT  08/23/2018  . IR NEPHROSTOMY EXCHANGE RIGHT  12/24/2017  . IR NEPHROSTOMY EXCHANGE RIGHT  01/28/2018  . IR NEPHROSTOMY EXCHANGE RIGHT  04/11/2018  . IR NEPHROSTOMY EXCHANGE RIGHT  07/05/2018  . IR NEPHROSTOMY EXCHANGE RIGHT  08/09/2018  . IR NEPHROSTOMY EXCHANGE RIGHT  08/19/2018  . IR NEPHROSTOMY PLACEMENT LEFT  11/28/2017  . IR NEPHROSTOMY PLACEMENT RIGHT  12/03/2017  . IR PATIENT EVAL TECH 0-60 MINS  03/24/2018  . IR PATIENT EVAL TECH 0-60 MINS  03/28/2018  . IR PATIENT EVAL TECH 0-60 MINS  04/25/2018  . IR PATIENT EVAL TECH 0-60 MINS  06/20/2018  . IR US GUIDE VASC ACCESS RIGHT  12/27/2017  . LAPAROSCOPY N/A 12/01/2017   Procedure: LAPAROSCOPIC DIVERTING OSTOMY;  Surgeon: Stark Klein, MD;  Location: [facility];  Service: General;  Laterality: N/A;  . LEFT HEART CATH AND CORS/GRAFTS ANGIOGRAPHY N/A 08/25/2017   Procedure: LEFT HEART CATH AND CORS/GRAFTS ANGIOGRAPHY;  Surgeon: Wellington Hampshire, MD;  Location: [facility];  Service: Cardiovascular;  Laterality: N/A;  . LUMBAR FUSION  2003   L3-L4  . PROSTATE SURGERY  06-27-12   per Dr. Roni Bread, had CTT  . TONSILLECTOMY    . TRANSURETHRAL RESECTION OF BLADDER TUMOR N/A 11/25/2017   Procedure: TRANSURETHRAL RESECTION OF BLADDER TUMOR (TURBT);  Surgeon: Irine Seal, MD;  Location: [facility];  Service: Urology;  Laterality: N/A;    Allergies: Statins  Medications: Prior to Admission medications   Medication Sig Start Date End Date Taking? Authorizing Provider  carvedilol (COREG) 3.125 MG tablet Take 1 tablet (3.125 mg  total) by mouth 2 (two) times daily. 07/04/18 09/20/18 Yes Donne Hazel, MD  DULoxetine (CYMBALTA) 20 MG capsule Take 1 capsule (20 mg total) by mouth 2 (two) times daily. Take once daily for 2 weeks then increase to twice daily Patient taking differently: Take 20 mg by mouth See admin instructions. Take 20 mg by mouth once daily for 2 weeks then increase to twice daily 08/12/18  Yes Tanner, Lyndon Code., PA-C  furosemide (LASIX) 40 MG tablet Take 1 tablet (40 mg total) by mouth 2 (two) times daily. Patient taking differently: Take 40 mg by mouth as needed for fluid or edema.  05/13/18  Yes End, Harrell Gave, MD  hydrOXYzine (ATARAX/VISTARIL) 25 MG tablet Take 1 tablet (25 mg total) by mouth every 4 (four) hours as needed for itching. 07/21/18  Yes [name], MD  OLANZapine (ZYPREXA) 5 MG tablet Take 1 tablet (5 mg total)  by mouth at bedtime. Patient taking differently: Take 2.5 mg by mouth at bedtime.  08/24/18  Yes Nita Sells, MD  oxyCODONE (OXYCONTIN) 10 mg 12 hr tablet Take 1 tablet (10 mg total) by mouth 2 (two) times daily. Patient taking differently: Take 10 mg by mouth at bedtime.  07/22/18  Yes Wyatt Portela, MD  oxyCODONE-acetaminophen (PERCOCET) 10-325 MG tablet Take 1 tablet by mouth every 4 (four) hours as needed for pain. 08/12/18  Yes Tanner, Lyndon Code., PA-C  pramipexole (MIRAPEX) 1 MG tablet TAKE 1 AND 1/2 TABLETS BY MOUTH AT BEDTIME 06/29/18  Yes [name], MD  ranitidine (ZANTAC) 300 MG tablet TAKE 1 TABLET BY MOUTH TWICE A DAY 09/07/18  Yes Pyrtle, Lajuan Lines, MD  feeding supplement (BOOST HIGH PROTEIN) LIQD Take 1 Container by mouth 2 (two) times daily between meals.    [provider]  lidocaine-prilocaine (EMLA) cream Apply 1 application topically as needed. Patient taking differently: Apply 1 application topically as needed (port access).  12/15/17   Wyatt Portela, MD  LINZESS 290 MCG CAPS capsule TAKE 1 CAP BY MOUTH ONCE DAILY 30 MIN PRIOR TO A MEAL. MUST HAVE OFFICE  VISIT FOR FURTHER REFILLS 05/12/18   Jerene Bears, MD  megestrol (MEGACE) 40 MG/ML suspension TAKE 10ML BY MOUTH TWICE A DAY 05/13/18   Wyatt Portela, MD  polyvinyl alcohol (LIQUIFILM TEARS) 1.4 % ophthalmic solution Place 1 drop into both eyes 2 (two) times daily as needed (dry eyes). 08/24/18   Nita Sells, MD  Tetrahydroz-Glyc-Hyprom-PEG (VISINE MAXIMUM REDNESS RELIEF OP) Place 2 drops into both eyes 2 (two) times daily as needed (dry eyes).     [provider]  triamcinolone cream (KENALOG) 0.1 % Apply topically 3 (three) times daily. To affected areas 07/04/18   Donne Hazel, MD     Family History  Problem Relation Age of Onset  . Heart attack Mother   . Aneurysm Father        femoral artery  . Heart disease Brother   . Heart disease Maternal Uncle        x 2  . Colon cancer Neg Hx   . Esophageal cancer Neg Hx   . Pancreatic cancer Neg Hx   . Kidney disease Neg Hx   . Liver disease Neg Hx     Social History   Socioeconomic History  . Marital status: Married    Spouse name: Not on file  . Number of children: 5  . Years of education: Some coll  . Highest education level: Not on file  Occupational History  . Occupation: retired  Scientific laboratory technician  . Financial resource strain: Not on file  . Food insecurity:    Worry: Not on file    Inability: Not on file  . Transportation needs:    Medical: Not on file    Non-medical: Not on file  Tobacco Use  . Smoking status: Former Smoker    Types: Cigarettes    Last attempt to quit: 12/28/1978    Years since quitting: 39.7  . Smokeless tobacco: Never Used  Substance and Sexual Activity  . Alcohol use: Not Currently    Alcohol/week: 2.0 standard drinks    Types: 1 Glasses of wine, 1 Cans of beer per week    Comment: occassional  . Drug use: No  . Sexual activity: Not Currently  Lifestyle  . Physical activity:    Days per week: Not on file    Minutes  per session: Not on file  . Stress: Not on file    Relationships  . Social connections:    Talks on phone: Not on file    Gets together: Not on file    Attends religious service: Not on file    Active member of club or organization: Not on file    Attends meetings of clubs or organizations: Not on file    Relationship status: Not on file  Other Topics Concern  . Not on file  Social History Narrative   Lives at home w/ his wife   Right-handed   5 cups of coffee per day     Review of Systems: A 12 point ROS discussed and pertinent positives are indicated in the HPI above.  All other systems are negative.  Review of Systems  Constitutional: Negative for appetite change, chills and fever.  Respiratory: Negative for shortness of breath.   Cardiovascular: Negative for chest pain.  Gastrointestinal: Negative for abdominal pain, nausea and vomiting.  Genitourinary: Negative for hematuria.  Skin:       Redness and drainage from SP catheter    Vital Signs: There were no vitals taken for this visit.  Physical Exam  Constitutional: No distress.  HENT:  Head: Normocephalic.  Cardiovascular: Normal rate, regular rhythm and normal heart sounds.  Pulmonary/Chest: Effort normal and breath sounds normal.  Abdominal: Soft. He exhibits no distension. There is no tenderness.  Colostomy present. Suprapubic catheter with some erythema at insertion site and crusting on gauze; no pain on palpation, no discharge, no warmth.  Neurological: He is alert.  Skin: Skin is warm and dry. He is not diaphoretic.  Nursing note and vitals reviewed.    MD Evaluation Airway: WNL Heart: WNL Abdomen: WNL Chest/ Lungs: WNL ASA  Classification: 3 Mallampati/Airway Score: One   Imaging: Ir Convert Right Nephrostomy To Nephroureteral Cath  Result Date: 08/23/2018 CLINICAL DATA:  Bladder carcinoma post chemotherapy. Recent placement of suprapubic catheter. EXAM: RIGHT NEPHROSTOMY CONVERSION TO NEPHROURETERAL CATHETER LEFT NEPHROSTOMY EXCHANGE UNDER  FLUOROSCOPY FLUOROSCOPY TIME:  seconds TECHNIQUE: The procedure, risks (including but not limited to bleeding, infection, organ damage ), benefits, and alternatives were explained to the patient. Questions regarding the procedure were encouraged and answered. The patient understands and consents to the procedure. The nephrostomy tubes and surrounding skin were prepped with Betadine, draped in usual sterile fashion. A small amount of contrast was injected through the right nephrostomy catheter to opacify the renal collecting system. The catheter was cut and exchanged over a 0.035" angiographic wire for a 5 French Kumpe catheter, negotiated into the urinary bladder. Over this, a 24 cm 10-French nephroureteral catheter was advanced antegrade, formed with the distal end in the urinary bladder, locking loop centrally within the collecting system under fluoroscopy. Contrast injection confirms appropriate positioning. Catheter was capped to allow drainage to the urinary bladder. In a similar fashion, the left nephrostomy catheter was injected, cut, and exchanged for a new 10-French pigtail catheter, formed centrally within the left renal collecting system. Injection confirms appropriate positioning and patency. Catheter was placed to external gravity drain bag. Both catheters were secured externally with a Statlock devices and additional 0 Prolene sutures. The patient tolerated the procedure well. COMPLICATIONS: None immediate IMPRESSION: 1. Technically successful conversion of right nephrostomy catheterfor antegrade nephroureteral catheter under fluoroscopy, left capped to allow antegrade drainage. 2. Technically successful exchange of left nephrostomy catheter under fluoroscopy. Electronically Signed   By: Lucrezia Europe M.D.   On: 08/23/2018 16:58  Ir Nephrostomy Exchange Left  Result Date: 08/23/2018 CLINICAL DATA:  Bladder carcinoma post chemotherapy. Recent placement of suprapubic catheter. EXAM: RIGHT NEPHROSTOMY  CONVERSION TO NEPHROURETERAL CATHETER LEFT NEPHROSTOMY EXCHANGE UNDER FLUOROSCOPY FLUOROSCOPY TIME:  seconds TECHNIQUE: The procedure, risks (including but not limited to bleeding, infection, organ damage ), benefits, and alternatives were explained to the patient. Questions regarding the procedure were encouraged and answered. The patient understands and consents to the procedure. The nephrostomy tubes and surrounding skin were prepped with Betadine, draped in usual sterile fashion. A small amount of contrast was injected through the right nephrostomy catheter to opacify the renal collecting system. The catheter was cut and exchanged over a 0.035" angiographic wire for a 5 French Kumpe catheter, negotiated into the urinary bladder. Over this, a 24 cm 10-French nephroureteral catheter was advanced antegrade, formed with the distal end in the urinary bladder, locking loop centrally within the collecting system under fluoroscopy. Contrast injection confirms appropriate positioning. Catheter was capped to allow drainage to the urinary bladder. In a similar fashion, the left nephrostomy catheter was injected, cut, and exchanged for a new 10-French pigtail catheter, formed centrally within the left renal collecting system. Injection confirms appropriate positioning and patency. Catheter was placed to external gravity drain bag. Both catheters were secured externally with a Statlock devices and additional 0 Prolene sutures. The patient tolerated the procedure well. COMPLICATIONS: None immediate IMPRESSION: 1. Technically successful conversion of right nephrostomy catheterfor antegrade nephroureteral catheter under fluoroscopy, left capped to allow antegrade drainage. 2. Technically successful exchange of left nephrostomy catheter under fluoroscopy. Electronically Signed   By: Lucrezia Europe M.D.   On: 08/23/2018 16:58    Labs:  CBC: Recent Labs    08/23/18 0403 08/24/18 0558 09/06/18 1429 09/20/18 1211  WBC 5.3  5.3 6.6 6.8  HGB 9.8* 10.3* 9.5* 8.6*  HCT 28.7* 30.4* 27.7* 25.2*  PLT 58* 66* 58.0* 73*    COAGS: Recent Labs    12/27/17 1227 07/04/18 0346 08/19/18 0813 08/22/18 1730 09/20/18 1211  INR 1.13 1.22 1.23 1.15 1.05  APTT 33  --  36 37* 34    BMP: Recent Labs    08/20/18 0335 08/21/18 1019 08/23/18 0403 08/24/18 0558 09/06/18 1429  NA 137 134* 138 138 136  K 4.2 3.7 3.6 3.9 4.2  CL 109 107 106 105 103  CO2 19* 21* 24 26 25   GLUCOSE 117* 126* 108* 98 89  BUN 29* 23 30* 36* 40*  CALCIUM 9.0 9.3 9.3 9.2 9.5  CREATININE 1.65* 1.25* 1.35* 1.53* 1.74*  GFRNONAA 37* 52* 48* 41*  --   GFRAA 43* >60 55* 47*  --     LIVER FUNCTION TESTS: Recent Labs    06/30/18 2009 07/01/18 0448 07/22/18 1226 08/12/18 1239  BILITOT 0.4 0.9 0.3 0.4  AST 15 16 16  14*  ALT 15 13 12 12   ALKPHOS 48 42 57 61  PROT 6.6 6.0* 7.1 7.2  ALBUMIN 3.3* 3.0* 3.9 3.8    TUMOR MARKERS: No results for input(s): AFPTM, CEA, CA199, CHROMGRNA in the last 8760 hours.  Assessment and Plan:  Patient scheduled for left PCN and right nephroureteral stent exchange with sedation today in IR - some concern for infection to suprapubic catheter insertion site and wife would like this to be changed as well. Insertion site with some minor erythema and crusting on gauze however no discharge, warmth, edema or pain on palpation; per wife patient has been afebrile aside from low grade fever one day last  week, WBC 6.8 today.  Patient last PO intake was 7 am this morning with medications, he does not take blood thinning medication.   Dr. Pascal Lux to see patient before procedure and wife requests he view suprapubic catheter as well due to concern for infection.  Risks and benefits of left PCN exchange and right nephroureteral stent exchange were discussed with the patient and wife including, but not limited to, infection, bleeding, significant bleeding causing loss or decrease in renal function or damage to adjacent  structures.   All of the patient and wife's questions were answered, patient and wife are agreeable to proceed.  Consent signed and in chart.  Thank you for this interesting consult.  I greatly enjoyed meeting Charles Coffey and look forward to participating in their care.  A copy of this report was sent to the requesting provider on this date.  Electronically Signed: Joaquim Nam, PA-C 09/20/2018, 12:47 PM   I spent a total of15 Minutes in face to face in clinical consultation, greater than 50% of which was counseling/coordinating care for left PCN exchange, right nephroureteral exchange.

## 2018-09-20 NOTE — Procedures (Signed)
Pre Procedure Dx: Hydronephrosis Post Procedure Dx: Same  Successful left sided Nephroureteral catheter exchange Success conversion of right sided PCN to a right sided Nephroureteral catheter.  Both nephroureteral catheters capped.  EBL: Trace   No immediate complications.   [name], MD Pager #: [phone]

## 2018-09-20 NOTE — Sedation Documentation (Signed)
Patient is resting comfortably with eyes closed in NAD. 

## 2018-09-20 NOTE — Discharge Instructions (Addendum)
Percutaneous Nephrostomy, Care After This sheet gives you information about how to care for yourself after your procedure. Your health care provider may also give you more specific instructions. If you have problems or questions, contact your health care provider. What can I expect after the procedure? After the procedure, it is common to have:  Some soreness where the nephrostomy tube was inserted (tube insertion site).  Blood-tinged drainage from the nephrostomy tube for the first 24 hours.  Follow these instructions at home: Activity  Return to your normal activities as told by your health care provider. Ask your health care provider what activities are safe for you.  Avoid activities that may cause the nephrostomy tubing to bend.  Do not take baths, swim, or use a hot tub until your health care provider approves. Ask your health care provider if you can take showers. Cover the nephrostomy tube dressing with a watertight covering when you take a shower.  Donot drive for 24 hours if you were given a medicine to help you relax (sedative). Care of the tube insertion site  Follow instructions from your health care provider about how to take care of your tube insertion site. Make sure you: ? Wash your hands with soap and water before you change your bandage (dressing). If soap and water are not available, use hand sanitizer. ? Change your dressing as told by your health care provider. Be careful not to pull on the tube while removing the dressing. ? When you change the dressing, wash the skin around the tube, rinse well, and pat the skin dry.  Check the tube insertion area every day for signs of infection. Check for: ? More redness, swelling, or pain. ? More fluid or blood. ? Warmth. ? Pus or a bad smell. Care of the nephrostomy tube and drainage bag  Always keep the tubing, the leg bag, or the bedside drainage bags below the level of the kidney so that your urine drains  freely.  When connecting your nephrostomy tube to a drainage bag, make sure that there are no kinks in the tubing and that your urine is draining freely. You may want to use an elastic bandage to wrap any exposed tubing that goes from the nephrostomy tube to any of the connecting tubes.  At night, you may want to connect your nephrostomy tube or the leg bag to a larger bedside drainage bag.  Follow instructions from your health care provider about how to empty or change the drainage bag.  Empty the drainage bag when it becomes ? full.  Replace the drainage bag and any extension tubing that is connected to your nephrostomy tube every 3 weeks or as often as told by your health care provider. Your health care provider will explain how to change the drainage bag and extension tubing. General instructions  Take over-the-counter and prescription medicines only as told by your health care provider.  Keep all follow-up visits as told by your health care provider. This is important. Contact a health care provider if:  You have problems with any of the valves or tubing.  You have persistent pain or soreness in your back.  You have more redness, swelling, or pain around your tube insertion site.  You have more fluid or blood coming from your tube insertion site.  Your tube insertion site feels warm to the touch.  You have pus or a bad smell coming from your tube insertion site.  You have increased urine output or you feel  burning when urinating. °Get help right away if: °· You have pain in your abdomen during the first week. °· You have chest pain or have trouble breathing. °· You have a new appearance of blood in your urine. °· You have a fever or chills. °· You have back pain that is not relieved by your medicine. °· You have decreased urine output. °· Your nephrostomy tube comes out. °This information is not intended to replace advice given to you by your health care provider. Make sure you  discuss any questions you have with your health care provider. °Document Released: 08/06/2004 Document Revised: 09/25/2016 Document Reviewed: 09/25/2016 °Elsevier Interactive Patient Education © 2018 Elsevier Inc[employer] ° ° ° °Moderate Conscious Sedation, Adult, Care After °These instructions provide you with information about caring for yourself after your procedure. Your health care provider may also give you more specific instructions. Your treatment has been planned according to current medical practices, but problems sometimes occur. Call your health care provider if you have any problems or questions after your procedure. °What can I expect after the procedure? °After your procedure, it is common: °· To feel sleepy for several hours. °· To feel clumsy and have poor balance for several hours. °· To have poor judgment for several hours. °· To vomit if you eat too soon. ° °Follow these instructions at home: °For at least 24 hours after the procedure: ° °· Do not: °? Participate in activities where you could fall or become injured. °? Drive. °? Use heavy machinery. °? Drink alcohol. °? Take sleeping pills or medicines that cause drowsiness. °? Make important decisions or sign legal documents. °? Take care of children on your own. °· Rest. °Eating and drinking °· Follow the diet recommended by your health care provider. °· If you vomit: °? Drink water, juice, or soup when you can drink without vomiting. °? Make sure you have little or no nausea before eating solid foods. °General instructions °· Have a responsible adult stay with you until you are awake and alert. °· Take over-the-counter and prescription medicines only as told by your health care provider. °· If you smoke, do not smoke without supervision. °· Keep all follow-up visits as told by your health care provider. This is important. °Contact a health care provider if: °· You keep feeling nauseous or you keep vomiting. °· You feel light-headed. °· You develop a  rash. °· You have a fever. °Get help right away if: °· You have trouble breathing. °This information is not intended to replace advice given to you by your health care provider. Make sure you discuss any questions you have with your health care provider. °Document Released: 10/04/2013 Document Revised: 05/18/2016 Document Reviewed: 04/04/2016 °Elsevier Interactive Patient Education © 2018 Elsevier Inc. ° °

## 2018-09-21 ENCOUNTER — Other Ambulatory Visit: Payer: Self-pay | Admitting: Pharmacist

## 2018-09-21 DIAGNOSIS — R8279 Other abnormal findings on microbiological examination of urine: Secondary | ICD-10-CM | POA: Diagnosis not present

## 2018-09-21 DIAGNOSIS — N131 Hydronephrosis with ureteral stricture, not elsewhere classified: Secondary | ICD-10-CM | POA: Diagnosis not present

## 2018-09-21 DIAGNOSIS — C670 Malignant neoplasm of trigone of bladder: Secondary | ICD-10-CM | POA: Diagnosis not present

## 2018-09-21 NOTE — Patient Outreach (Signed)
[name]) Care Management  09/21/2018  Charles Coffey [birth year] [id number]   Patient was called for follow up . HIPAA identifiers were obtained. Patient and his wife were on speaker phone. Charles Coffey said they were in the car on their way to an appointment to follow up on the surgery patient had yesterday involving his nephrostomy tubes. Patient reported he was in more pain today so he took two Oxycodone/APAP's today about 3 hours apart.  Patient's wife said other than the increased pain from the surgery yesterday, the patient had been doing well.  She reviewed his medications over the phone.  Medications Reviewed Today    Reviewed by Elayne Guerin, [facility] (Pharmacist) on 09/21/18 at 1157  Med List Status: <None>  Medication Order Taking? Sig Documenting Provider Last Dose Status Informant  carvedilol (COREG) 3.125 MG tablet [id number]  Take 1 tablet (3.125 mg total) by mouth 2 (two) times daily. Donne Hazel, MD  Expired 09/20/18 2359 Spouse/Significant Other  DULoxetine (CYMBALTA) 20 MG capsule [id number] Yes Take 1 capsule (20 mg total) by mouth 2 (two) times daily. Take once daily for 2 weeks then increase to twice daily  Patient taking differently:  Take 20 mg by mouth See admin instructions. Take 20 mg by mouth once daily for 2 weeks then increase to twice daily   Harle Stanford., PA-C Taking Active Spouse/Significant Other           Med Note Leslie Dales, ANNA E   Mon Aug 15, 2018 10:46 PM) Started 20 mg once daily on 08/12/18  feeding supplement (BOOST HIGH PROTEIN) LIQD [id number] Yes Take 1 Container by mouth 2 (two) times daily between meals. [provider] Taking Active Spouse/Significant Other  furosemide (LASIX) 40 MG tablet [id number] Yes Take 1 tablet (40 mg total) by mouth 2 (two) times daily.  Patient taking differently:  Take 40 mg by mouth as needed for fluid or edema.    End, Harrell Gave, MD Taking Active Spouse/Significant Other  hydrOXYzine  (ATARAX/VISTARIL) 25 MG tablet [id number] Yes Take 1 tablet (25 mg total) by mouth every 4 (four) hours as needed for itching. [name], MD Taking Active Spouse/Significant Other  lidocaine-prilocaine (EMLA) cream [id number] Yes Apply 1 application topically as needed.  Patient taking differently:  Apply 1 application topically as needed (port access).    Wyatt Portela, MD Taking Active Spouse/Significant Other           Med Note Leslie Dales, Sallee Provencal Aug 15, 2018 10:48 PM)    megestrol (MEGACE) 40 MG/ML suspension [id number] No TAKE 10ML BY MOUTH TWICE A DAY  Patient not taking:  Reported on 09/21/2018   Wyatt Portela, MD Not Taking Active Spouse/Significant Other           Med Note Regino Bellow Sep 01, 2018  4:38 PM) Not taking appetite has returned   OLANZapine (ZYPREXA) 5 MG tablet [id number] Yes Take 1 tablet (5 mg total) by mouth at bedtime.  Patient taking differently:  Take 2.5 mg by mouth at bedtime.    Nita Sells, MD Taking Active   oxyCODONE (OXYCONTIN) 10 mg 12 hr tablet [id number] Yes Take 1 tablet (10 mg total) by mouth 2 (two) times daily.  Patient taking differently:  Take 10 mg by mouth at bedtime.    Wyatt Portela, MD Taking Active Spouse/Significant Other  oxyCODONE-acetaminophen (PERCOCET) 10-325 MG tablet [id number] Yes Take 1 tablet by mouth every 4 (  four) hours as needed for pain. Harle Stanford., PA-C Taking Active Spouse/Significant Other  polyvinyl alcohol (LIQUIFILM TEARS) 1.4 % ophthalmic solution [id number] Yes Place 1 drop into both eyes 2 (two) times daily as needed (dry eyes). Nita Sells, MD Taking Active   pramipexole (MIRAPEX) 1 MG tablet [id number] Yes TAKE 1 AND 1/2 TABLETS BY MOUTH AT BEDTIME [name], MD Taking Active Spouse/Significant Other  ranitidine (ZANTAC) 300 MG tablet [id number] Yes TAKE 1 TABLET BY MOUTH TWICE A DAY Pyrtle, Lajuan Lines, MD Taking Active   Tetrahydroz-Glyc-Hyprom-PEG [facility] MAXIMUM REDNESS RELIEF OP)  [id number] Yes Place 2 drops into both eyes 2 (two) times daily as needed (dry eyes).  [provider] Taking Active Spouse/Significant Other  triamcinolone cream (KENALOG) 0.1 % [id number] Yes Apply topically 3 (three) times daily. To affected areas Donne Hazel, MD Taking Active Spouse/Significant Other         Plan: Close patient's case as he and his wife have a better understanding of his medication regimen now.  Patient and his wife understand they can call me back at any time in the future with medication related questions or concerns.    Elayne Guerin, PharmD, [place] Clinical Pharmacist [phone]

## 2018-09-22 ENCOUNTER — Ambulatory Visit: Payer: Self-pay | Admitting: Pharmacist

## 2018-09-23 ENCOUNTER — Telehealth: Payer: Self-pay

## 2018-09-23 ENCOUNTER — Inpatient Hospital Stay: Payer: Medicare HMO | Attending: Oncology

## 2018-09-23 ENCOUNTER — Inpatient Hospital Stay: Payer: Medicare HMO

## 2018-09-23 ENCOUNTER — Inpatient Hospital Stay (HOSPITAL_BASED_OUTPATIENT_CLINIC_OR_DEPARTMENT_OTHER): Payer: Medicare HMO | Admitting: Oncology

## 2018-09-23 VITALS — BP 107/65 | HR 100 | Temp 99.6°F | Resp 18 | Ht 68.0 in | Wt 191.6 lb

## 2018-09-23 DIAGNOSIS — N1330 Unspecified hydronephrosis: Secondary | ICD-10-CM | POA: Insufficient documentation

## 2018-09-23 DIAGNOSIS — Z936 Other artificial openings of urinary tract status: Secondary | ICD-10-CM | POA: Insufficient documentation

## 2018-09-23 DIAGNOSIS — C679 Malignant neoplasm of bladder, unspecified: Secondary | ICD-10-CM

## 2018-09-23 DIAGNOSIS — D638 Anemia in other chronic diseases classified elsewhere: Secondary | ICD-10-CM

## 2018-09-23 DIAGNOSIS — Z95828 Presence of other vascular implants and grafts: Secondary | ICD-10-CM

## 2018-09-23 DIAGNOSIS — D696 Thrombocytopenia, unspecified: Secondary | ICD-10-CM

## 2018-09-23 DIAGNOSIS — L299 Pruritus, unspecified: Secondary | ICD-10-CM | POA: Insufficient documentation

## 2018-09-23 DIAGNOSIS — C786 Secondary malignant neoplasm of retroperitoneum and peritoneum: Secondary | ICD-10-CM

## 2018-09-23 DIAGNOSIS — D630 Anemia in neoplastic disease: Secondary | ICD-10-CM | POA: Diagnosis not present

## 2018-09-23 DIAGNOSIS — R102 Pelvic and perineal pain: Secondary | ICD-10-CM

## 2018-09-23 LAB — CMP (CANCER CENTER ONLY)
ALBUMIN: 3.2 g/dL — AB (ref 3.5–5.0)
ALK PHOS: 61 U/L (ref 38–126)
ALT: 19 U/L (ref 0–44)
AST: 17 U/L (ref 15–41)
Anion gap: 10 (ref 5–15)
BUN: 24 mg/dL — AB (ref 8–23)
CALCIUM: 9.3 mg/dL (ref 8.9–10.3)
CO2: 19 mmol/L — AB (ref 22–32)
CREATININE: 1.77 mg/dL — AB (ref 0.61–1.24)
Chloride: 101 mmol/L (ref 98–111)
GFR, Est AFR Am: 40 mL/min — ABNORMAL LOW (ref >60)
GFR, Estimated: 34 mL/min — ABNORMAL LOW (ref >60)
GLUCOSE: 128 mg/dL — AB (ref 70–99)
Potassium: 4.4 mmol/L (ref 3.5–5.1)
SODIUM: 130 mmol/L — AB (ref 135–145)
TOTAL PROTEIN: 6.9 g/dL (ref 6.5–8.1)
Total Bilirubin: 0.8 mg/dL (ref 0.3–1.2)

## 2018-09-23 LAB — CBC WITH DIFFERENTIAL (CANCER CENTER ONLY)
BASOS PCT: 1 %
Basophils Absolute: 0.1 10*3/uL (ref 0.0–0.1)
EOS PCT: 2 %
Eosinophils Absolute: 0.2 10*3/uL (ref 0.0–0.5)
HEMATOCRIT: 21.9 % — AB (ref 38.4–49.9)
Hemoglobin: 7.5 g/dL — ABNORMAL LOW (ref 13.0–17.1)
Lymphocytes Relative: 10 %
Lymphs Abs: 1 10*3/uL (ref 0.9–3.3)
MCH: 31.8 pg (ref 27.2–33.4)
MCHC: 34.2 g/dL (ref 32.0–36.0)
MCV: 93.1 fL (ref 79.3–98.0)
MONO ABS: 0.8 10*3/uL (ref 0.1–0.9)
MONOS PCT: 8 %
Neutro Abs: 8.8 10*3/uL — ABNORMAL HIGH (ref 1.5–6.5)
Neutrophils Relative %: 79 %
PLATELETS: 60 10*3/uL — AB (ref 140–400)
RBC: 2.35 MIL/uL — ABNORMAL LOW (ref 4.20–5.82)
RDW: 18.9 % — AB (ref 11.0–14.6)
WBC Count: 10.9 10*3/uL — ABNORMAL HIGH (ref 4.0–10.3)

## 2018-09-23 MED ORDER — SODIUM CHLORIDE 0.9% FLUSH
10.0000 mL | Freq: Once | INTRAVENOUS | Status: AC
  Administered 2018-09-23: 10 mL
  Filled 2018-09-23: qty 10

## 2018-09-23 NOTE — Telephone Encounter (Signed)
Printed avs and calender of upcoming appointments. Per 9/27 los. Kale approved appointments to be moved by 1 day, due to previous scheduled appointments. Gave patient CT information with contradt

## 2018-09-23 NOTE — Progress Notes (Signed)
Hematology and Oncology Follow Up Visit  Charles Coffey [id number] 1935-11-08 83 y.o. 09/23/2018 9:40 AM [name], MDFry, [name], MD   Principle Diagnosis: 83 year old man with high-grade urothelial carcinoma of the genitourinary tract presented with stage IV disease including lymphadenopathy diagnosed in November 2018.  His tumor showed PDL 1 positive with 25% CPS     Prior Therapy:  He is status post cystoscopy and transurethral resection of a bladder tumor and on 11/25/2017.  He is status post bilateral nephrostomy tube placement in November 2018.  He is status post diverting colostomy done on 12/01/2017 performed by Dr. Barry Dienes due to colonic obstruction.  Chemotherapy utilizing gemcitabine and carboplatin.  Day 1 of cycle 1 was given on December 30, 2017.  He completed 2 cycles of therapy.  Therapy discontinued based on his wishes to poor tolerance.  Pembrolizumab 200 mg every 3 weeks started on March 23, 2018.  He completed 3 cycles of therapy.  Last treatment given on July 22, 2018 and received total of 4 cycles.  Current therapy:   Interim History: Mr. Gerling presents today for a return visit.  Since her last visit, he reports no major changes.  He was hospitalized in the end of August 2019 because of recurrent fevers.  After his discharge, his nephrostomy tubes have been internalized and has a suprapubic catheter only.  He did have a week where he was feeling very well including improving his appetite exercising and a bill to drive.  In the last few days however he did develop urinary tract infection and currently receiving oral antibiotics.  He did have a increased pain overnight and requires to take OxyContin and Percocet.  Overall, his quality of life remains maintained and he remains ambulatory.  He is not experiencing rapid decline in his overall quality of life and performance status.  He has not had any episodic confusion like he was experiencing while  hospitalized.   He does not report any headaches, blurry vision, syncope or seizures.  He he denies any dizziness or lethargy.  He does not report any fevers, chills, sweats. He does not report any chest pain, palpitation, orthopnea. .  He denies any hemoptysis or wheezing.  He does not report any nausea, vomiting or abdominal pain.  He denies any change in his bowel habits.He denies any dysuria or hematuria.  He does not report any bone pain or pathological fractures.   He does not report any skin rashes or lesions.  He denied any petechiae. He does not report any easy bruising.  He denies any changes in his mood.  Remaining review of systems is negative.  Medications: I have reviewed the patient's medication and discussed with the patient personally today. Current Outpatient Medications  Medication Sig Dispense Refill  . carvedilol (COREG) 3.125 MG tablet Take 1 tablet (3.125 mg total) by mouth 2 (two) times daily. 60 tablet 0  . DULoxetine (CYMBALTA) 20 MG capsule Take 1 capsule (20 mg total) by mouth 2 (two) times daily. Take once daily for 2 weeks then increase to twice daily (Patient taking differently: Take 20 mg by mouth See admin instructions. Take 20 mg by mouth once daily for 2 weeks then increase to twice daily) 60 capsule 3  . feeding supplement (BOOST HIGH PROTEIN) LIQD Take 1 Container by mouth 2 (two) times daily between meals.    . furosemide (LASIX) 40 MG tablet Take 1 tablet (40 mg total) by mouth 2 (two) times daily. (Patient taking differently:  Take 40 mg by mouth as needed for fluid or edema. ) 180 tablet 1  . hydrOXYzine (ATARAX/VISTARIL) 25 MG tablet Take 1 tablet (25 mg total) by mouth every 4 (four) hours as needed for itching. 100 tablet 2  . lidocaine-prilocaine (EMLA) cream Apply 1 application topically as needed. (Patient taking differently: Apply 1 application topically as needed (port access). ) 30 g 0  . megestrol (MEGACE) 40 MG/ML suspension TAKE 10ML BY MOUTH TWICE A  DAY (Patient not taking: Reported on 09/21/2018) 240 mL 0  . OLANZapine (ZYPREXA) 5 MG tablet Take 1 tablet (5 mg total) by mouth at bedtime. (Patient taking differently: Take 2.5 mg by mouth at bedtime. ) 30 tablet 0  . oxyCODONE (OXYCONTIN) 10 mg 12 hr tablet Take 1 tablet (10 mg total) by mouth 2 (two) times daily. (Patient taking differently: Take 10 mg by mouth at bedtime. ) 60 tablet 0  . oxyCODONE-acetaminophen (PERCOCET) 10-325 MG tablet Take 1 tablet by mouth every 4 (four) hours as needed for pain. 90 tablet 0  . polyvinyl alcohol (LIQUIFILM TEARS) 1.4 % ophthalmic solution Place 1 drop into both eyes 2 (two) times daily as needed (dry eyes). 15 mL 0  . pramipexole (MIRAPEX) 1 MG tablet TAKE 1 AND 1/2 TABLETS BY MOUTH AT BEDTIME 135 tablet 3  . ranitidine (ZANTAC) 300 MG tablet TAKE 1 TABLET BY MOUTH TWICE A DAY 60 tablet 1  . Tetrahydroz-Glyc-Hyprom-PEG (VISINE MAXIMUM REDNESS RELIEF OP) Place 2 drops into both eyes 2 (two) times daily as needed (dry eyes).     . triamcinolone cream (KENALOG) 0.1 % Apply topically 3 (three) times daily. To affected areas 30 g 0   No current facility-administered medications for this visit.      Allergies:  Allergies  Allergen Reactions  . Statins Other (See Comments)    liver effects    Past Medical History, Surgical history, Social history, and Family History reviewed today and remain unchanged.   Physical Exam: Blood pressure 107/65, pulse 100, temperature 99.6 F (37.6 C), resp. rate 18, height 5\' 8"  (1.727 m), weight 191 lb 9.6 oz (86.9 kg), SpO2 97 %. ECOG 1  General appearance: Comfortable appearing without any discomfort Head: Normocephalic without any trauma Oropharynx: Mucous membranes are moist and pink without any thrush or ulcers. Eyes: Pupils are equal and round reactive to light. Lymph nodes: No cervical, supraclavicular, inguinal or axillary lymphadenopathy.   Heart:regular rate and rhythm.  S1 and S2 without leg  edema. Lung: Clear without any rhonchi or wheezes.  No dullness to percussion. Abdomin: Soft, nontender, nondistended with good bowel sounds.  No hepatosplenomegaly. Musculoskeletal: No joint deformity or effusion.  Full range of motion noted. Neurological: No deficits noted on motor, sensory and deep tendon reflex exam. Skin: No petechial rash or dryness.  Appeared moist.   Lab Results: Lab Results  Component Value Date   WBC 6.8 09/20/2018   HGB 8.6 (L) 09/20/2018   HCT 25.2 (L) 09/20/2018   MCV 93.3 09/20/2018   PLT 73 (L) 09/20/2018     Chemistry      Component Value Date/Time   NA 136 09/06/2018 1429   NA 138 02/24/2018 1646   NA 133 (L) 12/29/2017 1014   K 4.2 09/06/2018 1429   K 4.4 12/29/2017 1014   CL 103 09/06/2018 1429   CO2 25 09/06/2018 1429   CO2 20 (L) 12/29/2017 1014   BUN 40 (H) 09/06/2018 1429   BUN 25 02/24/2018 1646  BUN 31.5 (H) 12/29/2017 1014   CREATININE 1.74 (H) 09/06/2018 1429   CREATININE 1.61 (H) 08/12/2018 1239   CREATININE 1.6 (H) 12/29/2017 1014      Component Value Date/Time   CALCIUM 9.5 09/06/2018 1429   CALCIUM 9.6 12/29/2017 1014   ALKPHOS 61 08/12/2018 1239   ALKPHOS 61 12/29/2017 1014   AST 14 (L) 08/12/2018 1239   AST 15 12/29/2017 1014   ALT 12 08/12/2018 1239   ALT 13 12/29/2017 1014   BILITOT 0.4 08/12/2018 1239   BILITOT 0.43 12/29/2017 10066       83 year old man with:   1.  Advanced high-grade urothelial carcinoma arising from the bladder in the upper genitourinary tract with retroperitoneal lymph nodes diagnosed in 2018.  He is status post therapies outlined above in the last cycle of Keytruda given in July 2019.  His disease has been relatively stable based on his last CT scan obtained in July 2019.  The natural course of his disease was reviewed again as well as treatment options.  At this time, his overall quality of life remains maintained and his performance status is adequate.  He had some issues related to  [facility] that has affected his quality of life and would like to hold off on any other therapy.  Despite his performance status and his candidacy for additional therapy, I have elected to continue with supportive care only and consideration for hospice if he has further decline in his overall performance status.  The plan is to repeat repeat staging work-up in November 2019 and consider the options at the time.   2.  Bilateral hydronephrosis: He has a nephrostomy tube internalized and currently has a suprapubic catheter only.  3.  Thrombocytopenia: No active bleeding noted at this time.  Platelet count improving.  4.  IV access: Port-A-Cath will be flushed periodically.  5.  Pelvic pain: His pain has been manageable overall and rarely takes OxyContin and Percocet but these medications are available to him when his instructions how to do it.  6.  Altered mental status: No confusion reported as of late and most of it related to pain medication.  7.  Anemia: He is asymptomatic from this finding.  His anemia is related to chronic disease and malignancy.  8.  Anorexia: His weight have been stable and actually improved.  9.  Prognosis and goals of care: This was discussed today in detail as well as life expectancy.  He has an incurable malignancy that has remained relatively stable over the last year.  He understands that his disease will likely progress at some point and will qualify for hospice at that time.  10.  Pruritus: This is related to Pembrolizumab.  This has been discontinued at this time with improvement in his pruritus.  11.  Follow-up: In 6 weeks to follow his progress.  25 minutes was spent today with the patient face-to-face.  More than 50% was spent today on reviewing the natural course of his disease, treatment options and coordinating plan of care.  Zola Button, MD 9/27/20199:40 AM

## 2018-09-24 ENCOUNTER — Other Ambulatory Visit: Payer: Self-pay | Admitting: Physician Assistant

## 2018-09-27 ENCOUNTER — Encounter: Payer: Self-pay | Admitting: General Practice

## 2018-09-27 NOTE — Progress Notes (Signed)
[place] Spiritual Care Note  Finally reached wife Charles Coffey by phone today for 22min conversation about stressors and self-care. She is grieving many layers of the old normal--missing the Charles Coffey who wasn't in such frequent pain, the marriage that included more reciprocity and fun times, the days when she could attend Jewish holiday services, etc--while also trying to carve out small breaks like seeing friends or a play. Charles Coffey was not up to talking today, but couple knows to contact chaplain whenever desired.   [place], [place], [facility] Pager [phone] Voicemail [phone]

## 2018-09-28 ENCOUNTER — Other Ambulatory Visit: Payer: Self-pay | Admitting: *Deleted

## 2018-09-28 DIAGNOSIS — Z933 Colostomy status: Secondary | ICD-10-CM | POA: Diagnosis not present

## 2018-09-28 DIAGNOSIS — M545 Low back pain, unspecified: Secondary | ICD-10-CM

## 2018-09-28 DIAGNOSIS — C679 Malignant neoplasm of bladder, unspecified: Secondary | ICD-10-CM

## 2018-09-28 DIAGNOSIS — G893 Neoplasm related pain (acute) (chronic): Secondary | ICD-10-CM

## 2018-09-28 DIAGNOSIS — K624 Stenosis of anus and rectum: Secondary | ICD-10-CM | POA: Diagnosis not present

## 2018-09-28 DIAGNOSIS — G8929 Other chronic pain: Secondary | ICD-10-CM

## 2018-09-28 MED ORDER — OXYCODONE-ACETAMINOPHEN 10-325 MG PO TABS: 1.0000 | ORAL_TABLET | ORAL | 0 refills | Status: DC | PRN

## 2018-09-28 NOTE — Telephone Encounter (Signed)
Pt's wife called asking for refill of percocet prescription.  Erline Levine RN for [facility] aware, pt's wife to pick up paper scrip in person today or tomorrow.  Verbalized understanding.

## 2018-09-30 ENCOUNTER — Other Ambulatory Visit (HOSPITAL_COMMUNITY): Payer: Self-pay | Admitting: Interventional Radiology

## 2018-09-30 DIAGNOSIS — T83022A Displacement of nephrostomy catheter, initial encounter: Secondary | ICD-10-CM

## 2018-10-05 ENCOUNTER — Other Ambulatory Visit (HOSPITAL_COMMUNITY): Payer: Medicare HMO

## 2018-10-06 DIAGNOSIS — Z7189 Other specified counseling: Secondary | ICD-10-CM | POA: Diagnosis not present

## 2018-10-10 ENCOUNTER — Telehealth: Payer: Self-pay | Admitting: Medical

## 2018-10-10 ENCOUNTER — Other Ambulatory Visit: Payer: Self-pay | Admitting: Medical

## 2018-10-10 ENCOUNTER — Ambulatory Visit (HOSPITAL_COMMUNITY)
Admission: RE | Admit: 2018-10-10 | Discharge: 2018-10-10 | Disposition: A | Discharge status: Home / Self Care | Payer: Medicare HMO | Source: Ambulatory Visit | Attending: Medical | Admitting: Medical

## 2018-10-10 ENCOUNTER — Inpatient Hospital Stay: Payer: Medicare HMO | Attending: Oncology

## 2018-10-10 ENCOUNTER — Inpatient Hospital Stay: Payer: Medicare HMO | Admitting: Lab

## 2018-10-10 ENCOUNTER — Inpatient Hospital Stay (HOSPITAL_BASED_OUTPATIENT_CLINIC_OR_DEPARTMENT_OTHER): Payer: Medicare HMO | Admitting: Medical

## 2018-10-10 VITALS — BP 103/64 | HR 75 | Temp 98.1°F | Resp 16 | Ht 68.0 in | Wt 189.5 lb

## 2018-10-10 DIAGNOSIS — R5383 Other fatigue: Secondary | ICD-10-CM | POA: Insufficient documentation

## 2018-10-10 DIAGNOSIS — R059 Cough, unspecified: Secondary | ICD-10-CM

## 2018-10-10 DIAGNOSIS — R5382 Chronic fatigue, unspecified: Secondary | ICD-10-CM | POA: Diagnosis present

## 2018-10-10 DIAGNOSIS — C679 Malignant neoplasm of bladder, unspecified: Secondary | ICD-10-CM

## 2018-10-10 DIAGNOSIS — R531 Weakness: Secondary | ICD-10-CM

## 2018-10-10 DIAGNOSIS — R42 Dizziness and giddiness: Secondary | ICD-10-CM

## 2018-10-10 DIAGNOSIS — N189 Chronic kidney disease, unspecified: Secondary | ICD-10-CM

## 2018-10-10 DIAGNOSIS — R400 Somnolence: Secondary | ICD-10-CM | POA: Diagnosis not present

## 2018-10-10 DIAGNOSIS — D631 Anemia in chronic kidney disease: Secondary | ICD-10-CM

## 2018-10-10 DIAGNOSIS — R0602 Shortness of breath: Secondary | ICD-10-CM | POA: Diagnosis not present

## 2018-10-10 DIAGNOSIS — R05 Cough: Secondary | ICD-10-CM

## 2018-10-10 DIAGNOSIS — R0609 Other forms of dyspnea: Secondary | ICD-10-CM | POA: Diagnosis not present

## 2018-10-10 DIAGNOSIS — Z87891 Personal history of nicotine dependence: Secondary | ICD-10-CM | POA: Insufficient documentation

## 2018-10-10 LAB — CBC WITH DIFFERENTIAL (CANCER CENTER ONLY)
Abs Immature Granulocytes: 0.11 10*3/uL — ABNORMAL HIGH (ref 0.00–0.07)
BASOS ABS: 0 10*3/uL (ref 0.0–0.1)
Basophils Relative: 1 %
Eosinophils Absolute: 0.3 10*3/uL (ref 0.0–0.5)
Eosinophils Relative: 4 %
HEMATOCRIT: 21.7 % — AB (ref 39.0–52.0)
HEMOGLOBIN: 7 g/dL — AB (ref 13.0–17.0)
IMMATURE GRANULOCYTES: 2 %
LYMPHS ABS: 1.1 10*3/uL (ref 0.7–4.0)
LYMPHS PCT: 20 %
MCH: 32.4 pg (ref 26.0–34.0)
MCHC: 32.3 g/dL (ref 30.0–36.0)
MCV: 100.5 fL — AB (ref 80.0–100.0)
MONOS PCT: 10 %
Monocytes Absolute: 0.5 10*3/uL (ref 0.1–1.0)
NEUTROS ABS: 3.6 10*3/uL (ref 1.7–7.7)
NEUTROS PCT: 63 %
NRBC: 0 % (ref 0.0–0.2)
Platelet Count: 45 10*3/uL — ABNORMAL LOW (ref 150–400)
RBC: 2.16 MIL/uL — ABNORMAL LOW (ref 4.22–5.81)
RDW: 20.3 % — AB (ref 11.5–15.5)
WBC Count: 5.6 10*3/uL (ref 4.0–10.5)

## 2018-10-10 LAB — COMPREHENSIVE METABOLIC PANEL
ALBUMIN: 3.4 g/dL — AB (ref 3.5–5.0)
ALT: 13 U/L (ref 0–44)
AST: 16 U/L (ref 15–41)
Alkaline Phosphatase: 57 U/L (ref 38–126)
Anion gap: 10 (ref 5–15)
BILIRUBIN TOTAL: 0.9 mg/dL (ref 0.3–1.2)
BUN: 30 mg/dL — ABNORMAL HIGH (ref 8–23)
CO2: 23 mmol/L (ref 22–32)
CREATININE: 1.67 mg/dL — AB (ref 0.61–1.24)
Calcium: 9.3 mg/dL (ref 8.9–10.3)
Chloride: 104 mmol/L (ref 98–111)
GFR calc Af Amer: 43 mL/min — ABNORMAL LOW (ref >60)
GFR, EST NON AFRICAN AMERICAN: 37 mL/min — AB (ref >60)
GLUCOSE: 103 mg/dL — AB (ref 70–99)
POTASSIUM: 4.2 mmol/L (ref 3.5–5.1)
Sodium: 137 mmol/L (ref 135–145)
TOTAL PROTEIN: 7.2 g/dL (ref 6.5–8.1)

## 2018-10-10 LAB — VITAMIN B12: VITAMIN B 12: 786 pg/mL (ref 180–914)

## 2018-10-10 LAB — PREPARE RBC (CROSSMATCH)

## 2018-10-10 MED ORDER — OLANZAPINE 5 MG PO TABS: 2.5000 mg | ORAL_TABLET | Freq: Every day | ORAL | 2 refills | Status: DC

## 2018-10-10 MED ORDER — LIDOCAINE-PRILOCAINE 2.5-2.5 % EX CREA: 1.0000 "application " | TOPICAL_CREAM | CUTANEOUS | 3 refills | Status: DC | PRN

## 2018-10-10 NOTE — Patient Instructions (Signed)
Anemia Anemia is a condition in which you do not have enough red blood cells or hemoglobin. Hemoglobin is a substance in red blood cells that carries oxygen. When you do not have enough red blood cells or hemoglobin (are anemic), your body cannot get enough oxygen and your organs may not work properly. As a result, you may feel very tired or have other problems. What are the causes? Common causes of anemia include:  Excessive bleeding. Anemia can be caused by excessive bleeding inside or outside the body, including bleeding from the intestine or from periods in women.  Poor nutrition.  Long-lasting (chronic) kidney, thyroid, and liver disease.  Bone marrow disorders.  Cancer and treatments for cancer.  HIV (human immunodeficiency virus) and AIDS (acquired immunodeficiency syndrome).  Treatments for HIV and AIDS.  Spleen problems.  Blood disorders.  Infections, medicines, and autoimmune disorders that destroy red blood cells.  What are the signs or symptoms? Symptoms of this condition include:  Minor weakness.  Dizziness.  Headache.  Feeling heartbeats that are irregular or faster than normal (palpitations).  Shortness of breath, especially with exercise.  Paleness.  Cold sensitivity.  Indigestion.  Nausea.  Difficulty sleeping.  Difficulty concentrating.  Symptoms may occur suddenly or develop slowly. If your anemia is mild, you may not have symptoms. How is this diagnosed? This condition is diagnosed based on:  Blood tests.  Your medical history.  A physical exam.  Bone marrow biopsy.  Your health care provider may also check your stool (feces) for blood and may do additional testing to look for the cause of your bleeding. You may also have other tests, including:  Imaging tests, such as a CT scan or MRI.  Endoscopy.  Colonoscopy.  How is this treated? Treatment for this condition depends on the cause. If you continue to lose a lot of blood,  you may need to be treated at a hospital. Treatment may include:  Taking supplements of iron, vitamin B12, or folic acid.  Taking a hormone medicine (erythropoietin) that can help to stimulate red blood cell growth.  Having a blood transfusion. This may be needed if you lose a lot of blood.  Making changes to your diet.  Having surgery to remove your spleen.  Follow these instructions at home:  Take over-the-counter and prescription medicines only as told by your health care provider.  Take supplements only as told by your health care provider.  Follow any diet instructions that you were given.  Keep all follow-up visits as told by your health care provider. This is important. Contact a health care provider if:  You develop new bleeding anywhere in the body. Get help right away if:  You are very weak.  You are short of breath.  You have pain in your abdomen or chest.  You are dizzy or feel faint.  You have trouble concentrating.  You have bloody or black, tarry stools.  You vomit repeatedly or you vomit up blood. Summary  Anemia is a condition in which you do not have enough red blood cells or enough of a substance in your red blood cells that carries oxygen (hemoglobin).  Symptoms may occur suddenly or develop slowly.  If your anemia is mild, you may not have symptoms.  This condition is diagnosed with blood tests as well as a medical history and physical exam. Other tests may be needed.  Treatment for this condition depends on the cause of the anemia. This information is not intended to replace advice   given to you by your health care provider. Make sure you discuss any questions you have with your health care provider. Document Released: 01/21/2005 Document Revised: 01/15/2017 Document Reviewed: 01/15/2017 Elsevier Interactive Patient Education  [employer].

## 2018-10-10 NOTE — Telephone Encounter (Signed)
Spoke with patients wife regarding appt per 10/14 sch msg

## 2018-10-11 ENCOUNTER — Inpatient Hospital Stay: Payer: Medicare HMO

## 2018-10-11 DIAGNOSIS — C679 Malignant neoplasm of bladder, unspecified: Secondary | ICD-10-CM | POA: Diagnosis not present

## 2018-10-11 DIAGNOSIS — Z87891 Personal history of nicotine dependence: Secondary | ICD-10-CM | POA: Diagnosis not present

## 2018-10-11 DIAGNOSIS — D631 Anemia in chronic kidney disease: Secondary | ICD-10-CM

## 2018-10-11 DIAGNOSIS — N189 Chronic kidney disease, unspecified: Secondary | ICD-10-CM | POA: Diagnosis not present

## 2018-10-11 DIAGNOSIS — Z95828 Presence of other vascular implants and grafts: Secondary | ICD-10-CM

## 2018-10-11 LAB — IRON AND TIBC
IRON: 46 ug/dL (ref 42–163)
Saturation Ratios: 19 % — ABNORMAL LOW (ref 42–163)
TIBC: 240 ug/dL (ref 202–409)
UIBC: 194 ug/dL

## 2018-10-11 MED ORDER — SODIUM CHLORIDE 0.9% IV SOLUTION
250.0000 mL | Freq: Once | INTRAVENOUS | Status: AC
  Administered 2018-10-11: 250 mL via INTRAVENOUS
  Filled 2018-10-11: qty 250

## 2018-10-11 MED ORDER — ACETAMINOPHEN 325 MG PO TABS
ORAL_TABLET | ORAL | Status: AC
  Filled 2018-10-11: qty 2

## 2018-10-11 MED ORDER — FUROSEMIDE 10 MG/ML IJ SOLN
INTRAMUSCULAR | Status: AC
  Filled 2018-10-11: qty 2

## 2018-10-11 MED ORDER — ACETAMINOPHEN 325 MG PO TABS
650.0000 mg | ORAL_TABLET | Freq: Once | ORAL | Status: AC
  Administered 2018-10-11: 650 mg via ORAL

## 2018-10-11 MED ORDER — DIPHENHYDRAMINE HCL 25 MG PO CAPS
25.0000 mg | ORAL_CAPSULE | Freq: Once | ORAL | Status: AC
  Administered 2018-10-11: 25 mg via ORAL

## 2018-10-11 MED ORDER — HEPARIN SOD (PORK) LOCK FLUSH 100 UNIT/ML IV SOLN
500.0000 [IU] | Freq: Once | INTRAVENOUS | Status: AC
  Administered 2018-10-11: 500 [IU] via INTRAVENOUS
  Filled 2018-10-11: qty 5

## 2018-10-11 MED ORDER — FUROSEMIDE 10 MG/ML IJ SOLN
20.0000 mg | Freq: Once | INTRAMUSCULAR | Status: AC
  Administered 2018-10-11: 20 mg via INTRAVENOUS

## 2018-10-11 MED ORDER — SODIUM CHLORIDE 0.9 % IJ SOLN
10.0000 mL | Freq: Once | INTRAMUSCULAR | Status: AC
  Administered 2018-10-11: 10 mL via INTRAVENOUS
  Filled 2018-10-11: qty 10

## 2018-10-11 MED ORDER — DIPHENHYDRAMINE HCL 25 MG PO CAPS
ORAL_CAPSULE | ORAL | Status: AC
  Filled 2018-10-11: qty 1

## 2018-10-11 NOTE — Patient Instructions (Signed)

## 2018-10-12 ENCOUNTER — Other Ambulatory Visit: Payer: Self-pay | Admitting: Family Medicine

## 2018-10-12 LAB — TYPE AND SCREEN
ABO/RH(D): A POS
Antibody Screen: NEGATIVE
UNIT DIVISION: 0
Unit division: 0

## 2018-10-12 LAB — BPAM RBC
BLOOD PRODUCT EXPIRATION DATE: 201911052359
Blood Product Expiration Date: 201911052359
ISSUE DATE / TIME: 201910150855
ISSUE DATE / TIME: 201910150855
UNIT TYPE AND RH: 6200
UNIT TYPE AND RH: 6200

## 2018-10-12 NOTE — Progress Notes (Signed)
[facility] Progress Note   XZAVIER SWINGER [id number] October 24, 1935 83 y.o.  [name] is managed by Dr. [name]  Actively treated with chemotherapy/immunotherapy: no  Assessment: Plan:    Anemia due to chronic kidney disease, unspecified CKD stage - Plan: Iron and TIBC, Vitamin B12, Draw extra clot specimen, Practitioner attestation of consent, Complete patient signature process for consent form, Care order/instruction, Type and screen, heparin lock flush 100 unit/mL, DISCONTINUED: 0.9 %  sodium chloride infusion (Manually program via Guardrails IV Fluids), DISCONTINUED: acetaminophen (TYLENOL) tablet 650 mg, DISCONTINUED: diphenhydrAMINE (BENADRYL) capsule 25 mg, DISCONTINUED: furosemide (LASIX) injection 20 mg, CANCELED: Prepare RBC, CANCELED: Transfuse RBC  Urothelial carcinoma of bladder (HCC)   Symptomatic anemia: CBC returned today with a hemoglobin of 7 and a hematocrit of 21.7.  Patient's platelet count was 45.  Iron studies and a vitamin B12 level were collected today.  He will return tomorrow for transfusion of 2 units of packed red blood cells.  Advanced urothelial carcinoma of the bladder: Mr. Hardge continues to be managed with supportive care.  He is scheduled to follow-up with Dr. should on on 11/18/2018.  Please see After Visit Summary for patient specific instructions.  Future Appointments  Date Time Provider [place]  10/24/2018 11:30 AM [facility] 2 [facility] [place]  11/14/2018 12:00 PM [facility] ROOM [facility] None  11/14/2018  1:30 PM [facility]IR 1 [facility] [place]  11/16/2018  1:30 PM [facility] LAB 2 [facility] None  11/16/2018  2:00 PM [facility] [place] FLUSH [facility] None  11/18/2018  9:00 AM Shadad, Mathis Dad, MD [facility] None  11/29/2018  3:00 PM Cameron Sprang, MD [facility] None    Orders Placed This Encounter  Procedures  . Iron and TIBC  . Vitamin B12  . Draw extra clot specimen  . Type and screen       Subjective:    Patient ID:  KOSTA SCHNITZLER is a 83 y.o. (DOB 16-Jun-1935) male.  Chief Complaint:  Chief Complaint  Patient presents with  . Fatigue    HPI AABAN GRIEP is an 83 year old male with a history of an advanced high-grade urothelial carcinoma who is managed by Dr. [name].  He was most recently treated with Beryle Flock which was last dosed in July 2019.  He is currently managed with supportive care.  He presents to the office today with a report fatigue, dizziness, weakness, shortness of breath, dyspnea on exertion, wheezing, and increased somnolence.  He denies nausea, vomiting, or diarrhea.  He and his wife have been concerned that this could indicate that he is in the end stage of his disease.  Medications: I have reviewed the patient's current medications.  Allergies:  Allergies  Allergen Reactions  . Statins Other (See Comments)    liver effects    Past Medical History:  Diagnosis Date  . Aortic atherosclerosis ([place])   . BPH with urinary obstruction   . CAD (coronary artery disease)    a.  MI 1995, CABG x 3 2002 (patient says that he had LIMA and RIMA grafts). b. ETT-Cardiolite (10/15) with EF 48%, apical scar, no ischemia. c. Nuc 07/2017 abnormal -> cath was performed,  patent LIMA to LAD and SVG to OM 2. RIMA to RCA is atretic. The right coronary artery is occluded distally with extensive collaterals from the LAD, medical therapy.   . Cancer [facility])    bladder cancer  . Cervical spondylosis without myelopathy 06-14-2016  . Chronic low back pain  sees Dr. Kary Kos   . GERD (gastroesophageal reflux disease)   . Hiatal hernia   . Hyperlipidemia   . Hypertension   . IBS (irritable bowel syndrome)   . Ischemic cardiomyopathy   . Myocardial infarction ([place])   . RBBB   . Restless legs syndrome   . Statin intolerance   . Syncope    a. in 2015 - no apparent cause, was taking sleep medicine at the time. Cardiac workup unremarkable.  . Tubular adenoma of colon     Past  Surgical History:  Procedure Laterality Date  . COLONOSCOPY  10/17/2014   per Dr. Hilarie Fredrickson, tubular adenomas, repeat in 3 yrs   . COLONOSCOPY WITH PROPOFOL N/A 12/01/2017   Procedure: COLONOSCOPY WITH PROPOFOL;  Surgeon: Milus Banister, MD;  [facility];  Service: Endoscopy;  Laterality: N/A;  . CYSTOSCOPY W/ RETROGRADES Left 11/25/2017   Procedure: CYSTOSCOPY WITH RETROGRADE PYELOGRAM;  Surgeon: Irine Seal, MD;  Location[facility];  Service: Urology;  Laterality: Left;  . HEART BYPASS    . IR CONVERT LEFT NEPHROSTOMY TO NEPHROURETERAL CATH  09/20/2018  . IR CONVERT RIGHT NEPHROSTOMY TO NEPHROURETERAL CATH  02/04/2018  . IR CONVERT RIGHT NEPHROSTOMY TO NEPHROURETERAL CATH  08/23/2018  . IR EXT NEPHROURETERAL CATH EXCHANGE  04/11/2018  . IR EXT NEPHROURETERAL CATH EXCHANGE  07/04/2018  . IR EXT NEPHROURETERAL CATH EXCHANGE  09/20/2018  . IR FLUORO GUIDE CV LINE RIGHT  12/27/2017  . IR FLUORO GUIDED NEEDLE PLC ASPIRATION/INJECTION LOC  08/19/2018  . IR NEPHROSTOGRAM LEFT THRU EXISTING ACCESS  12/24/2017  . IR NEPHROSTOMY EXCHANGE LEFT  01/28/2018  . IR NEPHROSTOMY EXCHANGE LEFT  02/04/2018  . IR NEPHROSTOMY EXCHANGE LEFT  02/25/2018  . IR NEPHROSTOMY EXCHANGE LEFT  07/04/2018  . IR NEPHROSTOMY EXCHANGE LEFT  07/05/2018  . IR NEPHROSTOMY EXCHANGE LEFT  08/09/2018  . IR NEPHROSTOMY EXCHANGE LEFT  08/16/2018  . IR NEPHROSTOMY EXCHANGE LEFT  08/23/2018  . IR NEPHROSTOMY EXCHANGE RIGHT  12/24/2017  . IR NEPHROSTOMY EXCHANGE RIGHT  01/28/2018  . IR NEPHROSTOMY EXCHANGE RIGHT  04/11/2018  . IR NEPHROSTOMY EXCHANGE RIGHT  07/05/2018  . IR NEPHROSTOMY EXCHANGE RIGHT  08/09/2018  . IR NEPHROSTOMY EXCHANGE RIGHT  08/19/2018  . IR NEPHROSTOMY PLACEMENT LEFT  11/28/2017  . IR NEPHROSTOMY PLACEMENT RIGHT  12/03/2017  . IR PATIENT EVAL TECH 0-60 MINS  03/24/2018  . IR PATIENT EVAL TECH 0-60 MINS  03/28/2018  . IR PATIENT EVAL TECH 0-60 MINS  04/25/2018  . IR PATIENT EVAL TECH 0-60 MINS  06/20/2018  . IR US GUIDE VASC  ACCESS RIGHT  12/27/2017  . LAPAROSCOPY N/A 12/01/2017   Procedure: LAPAROSCOPIC DIVERTING OSTOMY;  Surgeon: Stark Klein, MD;  [facility];  Service: General;  Laterality: N/A;  . LEFT HEART CATH AND CORS/GRAFTS ANGIOGRAPHY N/A 08/25/2017   Procedure: LEFT HEART CATH AND CORS/GRAFTS ANGIOGRAPHY;  Surgeon: Wellington Hampshire, MD;  Location: [place] CV LAB;  Service: Cardiovascular;  Laterality: N/A;  . LUMBAR FUSION  2003   L3-L4  . PROSTATE SURGERY  06-27-12   per Dr. Roni Bread, had CTT  . TONSILLECTOMY    . TRANSURETHRAL RESECTION OF BLADDER TUMOR N/A 11/25/2017   Procedure: TRANSURETHRAL RESECTION OF BLADDER TUMOR (TURBT);  Surgeon: Irine Seal, MD;  [facility];  Service: Urology;  Laterality: N/A;    Family History  Problem Relation Age of Onset  . Heart attack Mother   . Aneurysm Father  femoral artery  . Heart disease Brother   . Heart disease Maternal Uncle        x 2  . Colon cancer Neg Hx   . Esophageal cancer Neg Hx   . Pancreatic cancer Neg Hx   . Kidney disease Neg Hx   . Liver disease Neg Hx     Social History   Socioeconomic History  . Marital status: Married    Spouse name: Not on file  . Number of children: 5  . Years of education: Some coll  . Highest education level: Not on file  Occupational History  . Occupation: retired  Scientific laboratory technician  . Financial resource strain: Not on file  . Food insecurity:    Worry: Not on file    Inability: Not on file  . Transportation needs:    Medical: Not on file    Non-medical: Not on file  Tobacco Use  . Smoking status: Former Smoker    Types: Cigarettes    Last attempt to quit: 12/28/1978    Years since quitting: 39.8  . Smokeless tobacco: Never Used  Substance and Sexual Activity  . Alcohol use: Not Currently    Alcohol/week: 2.0 standard drinks    Types: 1 Glasses of wine, 1 Cans of beer per week    Comment: occassional  . Drug use: No  . Sexual activity: Not Currently  Lifestyle  . Physical  activity:    Days per week: Not on file    Minutes per session: Not on file  . Stress: Not on file  Relationships  . Social connections:    Talks on phone: Not on file    Gets together: Not on file    Attends religious service: Not on file    Active member of club or organization: Not on file    Attends meetings of clubs or organizations: Not on file    Relationship status: Not on file  . Intimate partner violence:    Fear of current or ex partner: Not on file    Emotionally abused: Not on file    Physically abused: Not on file    Forced sexual activity: Not on file  Other Topics Concern  . Not on file  Social History Narrative   Lives at home w/ his wife   Right-handed   5 cups of coffee per day    Past Medical History, Surgical history, Social history, and Family history were reviewed and updated as appropriate.   Please see review of systems for further details on the patient's review from today.   Review of Systems:  Review of Systems  Constitutional: Positive for fatigue. Negative for appetite change, chills, diaphoresis and fever.  HENT: Negative for dental problem, mouth sores and trouble swallowing.   Respiratory: Positive for shortness of breath. Negative for cough and chest tightness.   Cardiovascular: Negative for chest pain and palpitations.  Gastrointestinal: Negative for constipation, diarrhea, nausea and vomiting.  Neurological: Positive for dizziness and weakness. Negative for syncope and headaches.  Psychiatric/Behavioral: Positive for sleep disturbance.    Objective:   Physical Exam:  BP 103/64 (BP Location: Right Arm, Patient Position: Sitting)   Pulse 75   Temp 98.1 F (36.7 C) (Oral)   Resp 16   Ht 5\' 8"  (1.727 m)   Wt 189 lb 8 oz (86 kg)   SpO2 99%   BMI 28.81 kg/m  ECOG: 1  Physical Exam  Constitutional: No distress.  HENT:  Head: Normocephalic and  atraumatic.  Neck: Normal range of motion. Neck supple.  Cardiovascular: Normal rate,  regular rhythm and normal heart sounds. Exam reveals no gallop and no friction rub.  No murmur heard. Pulmonary/Chest: Effort normal and breath sounds normal. No respiratory distress. He has no wheezes. He has no rales.  Lymphadenopathy:    He has no cervical adenopathy.  Neurological: He is alert. Coordination normal.  Skin: Skin is warm and dry. No rash noted. He is not diaphoretic. No erythema.    Lab Review:     Component Value Date/Time   NA 137 10/10/2018 1113   NA 138 02/24/2018 1646   NA 133 (L) 12/29/2017 1014   K 4.2 10/10/2018 1113   K 4.4 12/29/2017 1014   CL 104 10/10/2018 1113   CO2 23 10/10/2018 1113   CO2 20 (L) 12/29/2017 1014   GLUCOSE 103 (H) 10/10/2018 1113   GLUCOSE 102 12/29/2017 1014   BUN 30 (H) 10/10/2018 1113   BUN 25 02/24/2018 1646   BUN 31.5 (H) 12/29/2017 1014   CREATININE 1.67 (H) 10/10/2018 1113   CREATININE 1.77 (H) 09/23/2018 0913   CREATININE 1.6 (H) 12/29/2017 1014   CALCIUM 9.3 10/10/2018 1113   CALCIUM 9.6 12/29/2017 1014   PROT 7.2 10/10/2018 1113   PROT 6.8 12/29/2017 1014   ALBUMIN 3.4 (L) 10/10/2018 1113   ALBUMIN 3.8 12/29/2017 1014   AST 16 10/10/2018 1113   AST 17 09/23/2018 0913   AST 15 12/29/2017 1014   ALT 13 10/10/2018 1113   ALT 19 09/23/2018 0913   ALT 13 12/29/2017 1014   ALKPHOS 57 10/10/2018 1113   ALKPHOS 61 12/29/2017 1014   BILITOT 0.9 10/10/2018 1113   BILITOT 0.8 09/23/2018 0913   BILITOT 0.43 12/29/2017 1014   GFRNONAA 37 (L) 10/10/2018 1113   GFRNONAA 34 (L) 09/23/2018 0913   GFRAA 43 (L) 10/10/2018 1113   GFRAA 40 (L) 09/23/2018 0913       Component Value Date/Time   WBC 5.6 10/10/2018 1108   WBC 6.8 09/20/2018 1211   RBC 2.16 (L) 10/10/2018 1108   HGB 7.0 (LL) 10/10/2018 1108   HGB 8.8 (L) 12/29/2017 1014   HCT 21.7 (L) 10/10/2018 1108   HCT 26.0 (L) 12/29/2017 1014   PLT 45 (L) 10/10/2018 1108   PLT 70 (L) 12/29/2017 1014   PLT 115 (L) 08/31/2017 1135   MCV 100.5 (H) 10/10/2018 1108   MCV  89.3 12/29/2017 1014   MCH 32.4 10/10/2018 1108   MCHC 32.3 10/10/2018 1108   RDW 20.3 (H) 10/10/2018 1108   RDW 15.5 (H) 12/29/2017 1014   LYMPHSABS 1.1 10/10/2018 1108   LYMPHSABS 1.4 12/29/2017 1014   MONOABS 0.5 10/10/2018 1108   MONOABS 0.6 12/29/2017 1014   EOSABS 0.3 10/10/2018 1108   EOSABS 0.2 12/29/2017 1014   BASOSABS 0.0 10/10/2018 1108   BASOSABS 0.0 12/29/2017 1014   -------------------------------  Imaging from last 24 hours (if applicable):  Radiology interpretation: Dg Chest 2 View  Result Date: 10/10/2018 CLINICAL DATA:  Cough and shortness of breath for 4 days EXAM: CHEST - 2 VIEW COMPARISON:  07/02/2018 FINDINGS: Stable mild cardiomegaly. Status post CABG. Porta catheter on the right with tip in good position at the upper cavoatrial junction. Interstitial prominence, accentuated by low volumes, stable from prior. There is no edema, consolidation, effusion, or pneumothorax. IMPRESSION: Stable from prior.  No evidence of acute disease. Electronically Signed   By: Monte Fantasia M.D.   On: 10/10/2018 10:39  Ir Ext Nephroureteral Cath Exchange  Result Date: 09/20/2018 INDICATION: History of bladder cancer with subsequent bilateral hydronephrosis requiring placement of bilateral percutaneous nephrostomy catheters and ultimately placement of a suprapubic catheter. Patient has tolerated the nephrostomy catheters poorly with multiple previous episodes of inadvertent removal secondary to altered mental status/sun downing. Patient currently has a right-sided nephroureteral catheter as well as a left-sided percutaneous nephrostomy catheter (note, previous attempts at placement of a left-sided nephroureteral catheter was unsuccessful). Patient has tolerated capping of the right-sided nephroureteral catheter well as such and presents today for routine right-sided nephroureteral catheter exchange Additionally, request has been made for attempted conversion the left-sided  percutaneous nephrostomy catheter to a nephroureteral catheter in hopes of internalization. EXAM: 1. FLUOROSCOPIC GUIDED RIGHT SIDED NEPHROURETERAL CATHETER EXCHANGE 2. FLUOROSCOPIC GUIDED CONVERSION OF LEFT-SIDED PERCUTANEOUS NEPHROSTOMY CATHETER TO A LEFT-SIDED NEPHROURETERAL CATHETER. COMPARISON:  Multiple previous fluoroscopic guided exchanges and replacements, most recently on 08/23/2018. CONTRAST:  A total of approximately 20 cc Isovue-300 was administered into bilateral renal collecting systems. ANESTHESIA/SEDATION: Moderate (conscious) sedation was employed during this procedure. A total of Versed 6 mg and Fentanyl 250 mcg was administered intravenously. Moderate Sedation Time: 43 minutes. The patient's level of consciousness and vital signs were monitored continuously by radiology nursing throughout the procedure under my direct supervision. MEDICATIONS: Ciprofloxacin 400 mg IV; the antibiotic was administered with an appropriate time frame prior to the initiation of the procedure. FLUOROSCOPY TIME:  14 minutes, 18 seconds (194 mGy) COMPLICATIONS: None immediate. TECHNIQUE: Informed written consent was obtained from the patient after a discussion of the risks, benefits and alternatives to treatment. Questions regarding the procedure were encouraged and answered. A timeout was performed prior to the initiation of the procedure. The bilateral flanks and external portions of the left-sided nephroureteral catheter and right-sided percutaneous nephrostomy catheter was prepped and draped in usual sterile fashion. A sterile drape was applied covering the operative field. Maximum barrier sterile technique with sterile gowns and gloves were used for the procedure. A timeout was performed prior to the initiation of the procedure. Beginning with the left-sided nephroureteral catheter, a pre procedural spot fluoroscopic image was obtained after contrast was injected. The existing catheter was cut and cannulated with an  Amplatz wire which was coiled within the urinary bladder under intermittent fluoroscopic guidance, the existing nephrostomy catheter was exchanged for a new 10.2 French 24 cm nephroureteral catheter. Contrast injection confirmed appropriate positioning and a post exchange fluoroscopic image was obtained. _________________________________________________________ Attention was now paid towards attempted conversion of the right-sided nephrostomy catheter to a right-sided nephroureteral catheter Preprocedural spot fluoroscopic image was obtained findings action of a small amount of contrast demonstrating appropriate positioning and functionality of the right-sided percutaneous nephrostomy catheter. The external portion the nephrostomy with cut and cannulated with a Bentson wire. Under intermittent fluoroscopic guidance, existing nephrostomy catheter was exchanged for a 9 French radiopaque tip vascular sheath after the surrounding soft tissues were anesthetized with 1% lidocaine with epinephrine. With the use of a regular glidewire, a Kumpe catheter was advanced through the right ureter to the level of the urinary bladder. Contrast injection confirmed appropriate positioning. Next, ultimately with the use of a Lunderquist wire, the vascular sheath was exchanged for a 10 French, 22 cm nephroureteral catheter with tip terminating within the urinary bladder and superior coil within the right renal pelvis. Contrast injection demonstrated appropriate position functionality of the right-sided nephroureteral catheter. Both nephroureteral catheters were capped. Dressings were placed. Patient tolerated the procedure well without immediate postprocedural complication. FINDINGS:  Existing left-sided nephroureteral catheter is appropriately positioned and functioning. After fluoroscopic guided exchange, the new 10 French, 24 cm nephroureteral catheter is appropriately positioned with inferior tip within urinary bladder and superior  coil within the renal pelvis. Existing right-sided percutaneous nephrostomy catheter is appropriately positioned and functioning. After fluoroscopic guided conversion, the new 10 French, 22 cm nephroureteral catheter is appropriately positioned with inferior tip within the urinary bladder and superior coil within the renal pelvis. Apparent appropriate positioning of patient's suprapubic catheter though dedicated injection was not performed. IMPRESSION: 1. Successful fluoroscopic guided conversion of existing right-sided nephrostomy catheter to a new 10.2 French, 22 cm nephroureteral catheter. 2. Successful fluoroscopic guided exchange of left sided 10.2 French, 24 cm nephroureteral catheter. PLAN: - The patient was instructed to flush the right-sided nephroureteral catheter with 10 cc twice a day for the next 3-5 days. - The patient was given a gravity bag and instructed to connect either nephroureteral catheter to a gravity bag if he were to develop recurrent obstructive symptoms. - The patient will return for fluoroscopic guided exchange of both nephroureteral catheters as well as the suprapubic catheter in approximately 2 months. Electronically Signed   By: Sandi Mariscal M.D.   On: 09/20/2018 17:49   Ir Convert Left Nephrostomy To Nephroureteral Cath  Result Date: 09/20/2018 INDICATION: History of bladder cancer with subsequent bilateral hydronephrosis requiring placement of bilateral percutaneous nephrostomy catheters and ultimately placement of a suprapubic catheter. Patient has tolerated the nephrostomy catheters poorly with multiple previous episodes of inadvertent removal secondary to altered mental status/sun downing. Patient currently has a right-sided nephroureteral catheter as well as a left-sided percutaneous nephrostomy catheter (note, previous attempts at placement of a left-sided nephroureteral catheter was unsuccessful). Patient has tolerated capping of the right-sided nephroureteral catheter well  as such and presents today for routine right-sided nephroureteral catheter exchange Additionally, request has been made for attempted conversion the left-sided percutaneous nephrostomy catheter to a nephroureteral catheter in hopes of internalization. EXAM: 1. FLUOROSCOPIC GUIDED RIGHT SIDED NEPHROURETERAL CATHETER EXCHANGE 2. FLUOROSCOPIC GUIDED CONVERSION OF LEFT-SIDED PERCUTANEOUS NEPHROSTOMY CATHETER TO A LEFT-SIDED NEPHROURETERAL CATHETER. COMPARISON:  Multiple previous fluoroscopic guided exchanges and replacements, most recently on 08/23/2018. CONTRAST:  A total of approximately 20 cc Isovue-300 was administered into bilateral renal collecting systems. ANESTHESIA/SEDATION: Moderate (conscious) sedation was employed during this procedure. A total of Versed 6 mg and Fentanyl 250 mcg was administered intravenously. Moderate Sedation Time: 43 minutes. The patient's level of consciousness and vital signs were monitored continuously by radiology nursing throughout the procedure under my direct supervision. MEDICATIONS: Ciprofloxacin 400 mg IV; the antibiotic was administered with an appropriate time frame prior to the initiation of the procedure. FLUOROSCOPY TIME:  14 minutes, 18 seconds (323 mGy) COMPLICATIONS: None immediate. TECHNIQUE: Informed written consent was obtained from the patient after a discussion of the risks, benefits and alternatives to treatment. Questions regarding the procedure were encouraged and answered. A timeout was performed prior to the initiation of the procedure. The bilateral flanks and external portions of the left-sided nephroureteral catheter and right-sided percutaneous nephrostomy catheter was prepped and draped in usual sterile fashion. A sterile drape was applied covering the operative field. Maximum barrier sterile technique with sterile gowns and gloves were used for the procedure. A timeout was performed prior to the initiation of the procedure. Beginning with the left-sided  nephroureteral catheter, a pre procedural spot fluoroscopic image was obtained after contrast was injected. The existing catheter was cut and cannulated with an Amplatz wire which was coiled  within the urinary bladder under intermittent fluoroscopic guidance, the existing nephrostomy catheter was exchanged for a new 10.2 French 24 cm nephroureteral catheter. Contrast injection confirmed appropriate positioning and a post exchange fluoroscopic image was obtained. _________________________________________________________ Attention was now paid towards attempted conversion of the right-sided nephrostomy catheter to a right-sided nephroureteral catheter Preprocedural spot fluoroscopic image was obtained findings action of a small amount of contrast demonstrating appropriate positioning and functionality of the right-sided percutaneous nephrostomy catheter. The external portion the nephrostomy with cut and cannulated with a Bentson wire. Under intermittent fluoroscopic guidance, existing nephrostomy catheter was exchanged for a 9 French radiopaque tip vascular sheath after the surrounding soft tissues were anesthetized with 1% lidocaine with epinephrine. With the use of a regular glidewire, a Kumpe catheter was advanced through the right ureter to the level of the urinary bladder. Contrast injection confirmed appropriate positioning. Next, ultimately with the use of a Lunderquist wire, the vascular sheath was exchanged for a 10 French, 22 cm nephroureteral catheter with tip terminating within the urinary bladder and superior coil within the right renal pelvis. Contrast injection demonstrated appropriate position functionality of the right-sided nephroureteral catheter. Both nephroureteral catheters were capped. Dressings were placed. Patient tolerated the procedure well without immediate postprocedural complication. FINDINGS: Existing left-sided nephroureteral catheter is appropriately positioned and functioning. After  fluoroscopic guided exchange, the new 10 French, 24 cm nephroureteral catheter is appropriately positioned with inferior tip within urinary bladder and superior coil within the renal pelvis. Existing right-sided percutaneous nephrostomy catheter is appropriately positioned and functioning. After fluoroscopic guided conversion, the new 10 French, 22 cm nephroureteral catheter is appropriately positioned with inferior tip within the urinary bladder and superior coil within the renal pelvis. Apparent appropriate positioning of patient's suprapubic catheter though dedicated injection was not performed. IMPRESSION: 1. Successful fluoroscopic guided conversion of existing right-sided nephrostomy catheter to a new 10.2 French, 22 cm nephroureteral catheter. 2. Successful fluoroscopic guided exchange of left sided 10.2 French, 24 cm nephroureteral catheter. PLAN: - The patient was instructed to flush the right-sided nephroureteral catheter with 10 cc twice a day for the next 3-5 days. - The patient was given a gravity bag and instructed to connect either nephroureteral catheter to a gravity bag if he were to develop recurrent obstructive symptoms. - The patient will return for fluoroscopic guided exchange of both nephroureteral catheters as well as the suprapubic catheter in approximately 2 months. Electronically Signed   By: Sandi Mariscal M.D.   On: 09/20/2018 17:49

## 2018-10-14 DIAGNOSIS — R8279 Other abnormal findings on microbiological examination of urine: Secondary | ICD-10-CM | POA: Diagnosis not present

## 2018-10-14 DIAGNOSIS — C670 Malignant neoplasm of trigone of bladder: Secondary | ICD-10-CM | POA: Diagnosis not present

## 2018-10-14 DIAGNOSIS — N131 Hydronephrosis with ureteral stricture, not elsewhere classified: Secondary | ICD-10-CM | POA: Diagnosis not present

## 2018-10-17 ENCOUNTER — Other Ambulatory Visit: Payer: Self-pay | Admitting: Family Medicine

## 2018-10-18 ENCOUNTER — Other Ambulatory Visit: Payer: Self-pay | Admitting: *Deleted

## 2018-10-18 MED ORDER — FAMOTIDINE 20 MG PO TABS: 20.0000 mg | ORAL_TABLET | Freq: Two times a day (BID) | ORAL | 2 refills | Status: DC

## 2018-10-18 MED ORDER — LIDOCAINE-PRILOCAINE 2.5-2.5 % EX CREA: 1.0000 "application " | TOPICAL_CREAM | CUTANEOUS | 3 refills | Status: DC | PRN

## 2018-10-19 DIAGNOSIS — Z933 Colostomy status: Secondary | ICD-10-CM | POA: Diagnosis not present

## 2018-10-19 DIAGNOSIS — K624 Stenosis of anus and rectum: Secondary | ICD-10-CM | POA: Diagnosis not present

## 2018-10-20 DIAGNOSIS — K624 Stenosis of anus and rectum: Secondary | ICD-10-CM | POA: Diagnosis not present

## 2018-10-20 DIAGNOSIS — Z933 Colostomy status: Secondary | ICD-10-CM | POA: Diagnosis not present

## 2018-10-20 NOTE — Progress Notes (Signed)
PA for EMLA cream has been submitted. 

## 2018-10-21 DIAGNOSIS — C670 Malignant neoplasm of trigone of bladder: Secondary | ICD-10-CM | POA: Diagnosis not present

## 2018-10-21 DIAGNOSIS — R3915 Urgency of urination: Secondary | ICD-10-CM | POA: Diagnosis not present

## 2018-10-21 DIAGNOSIS — N130 Hydronephrosis with ureteropelvic junction obstruction: Secondary | ICD-10-CM | POA: Diagnosis not present

## 2018-10-24 ENCOUNTER — Encounter (HOSPITAL_COMMUNITY): Payer: Self-pay

## 2018-10-24 ENCOUNTER — Ambulatory Visit (HOSPITAL_COMMUNITY)
Admission: RE | Admit: 2018-10-24 | Discharge: 2018-10-24 | Disposition: A | Discharge status: Home / Self Care | Payer: Medicare HMO | Source: Ambulatory Visit | Attending: Oncology | Admitting: Oncology

## 2018-10-24 DIAGNOSIS — C679 Malignant neoplasm of bladder, unspecified: Secondary | ICD-10-CM | POA: Insufficient documentation

## 2018-10-24 DIAGNOSIS — R918 Other nonspecific abnormal finding of lung field: Secondary | ICD-10-CM | POA: Diagnosis not present

## 2018-10-24 DIAGNOSIS — I700 Atherosclerosis of aorta: Secondary | ICD-10-CM | POA: Insufficient documentation

## 2018-10-24 DIAGNOSIS — J984 Other disorders of lung: Secondary | ICD-10-CM | POA: Insufficient documentation

## 2018-10-24 DIAGNOSIS — K828 Other specified diseases of gallbladder: Secondary | ICD-10-CM | POA: Diagnosis not present

## 2018-10-26 DIAGNOSIS — R21 Rash and other nonspecific skin eruption: Secondary | ICD-10-CM | POA: Diagnosis not present

## 2018-10-26 DIAGNOSIS — L298 Other pruritus: Secondary | ICD-10-CM | POA: Diagnosis not present

## 2018-10-27 ENCOUNTER — Other Ambulatory Visit: Payer: Self-pay

## 2018-10-27 ENCOUNTER — Telehealth: Payer: Self-pay

## 2018-10-27 DIAGNOSIS — C679 Malignant neoplasm of bladder, unspecified: Secondary | ICD-10-CM

## 2018-10-27 DIAGNOSIS — M545 Low back pain, unspecified: Secondary | ICD-10-CM

## 2018-10-27 DIAGNOSIS — G8929 Other chronic pain: Secondary | ICD-10-CM

## 2018-10-27 DIAGNOSIS — G893 Neoplasm related pain (acute) (chronic): Secondary | ICD-10-CM

## 2018-10-27 MED ORDER — OXYCODONE-ACETAMINOPHEN 10-325 MG PO TABS: 1.0000 | ORAL_TABLET | ORAL | 0 refills | Status: DC | PRN

## 2018-10-27 NOTE — Telephone Encounter (Signed)
Contacted patient and made aware that prescription is ready for pick up. Also stated that the CT scans are good and no changes per Dr. [name]. The patient spouse stated that the patient had a fever all last weekend and so she gave him a sulfa antibiotic that she had on hand which cleared up the fever. She then stated that the patient has been itching his whole body and is unable to rest because the itching is so bad. Upon further questioning the itching seems to have started when she started the unprescribed antibiotic. Explained to stop the antibiotic and to monitor the patient and call back on Monday if he starts with a fever again or the itching does not improve. Spouse verbalized understanding and was agreeable with the plan.

## 2018-10-31 ENCOUNTER — Telehealth: Payer: Self-pay

## 2018-10-31 NOTE — Telephone Encounter (Signed)
Received call from patient spouse [name] stating that the spouse is anxious regarding the my chart review of the CT scans. This RN reviewed and explained that scans with the spouse in that the images are compared to the previous images and there are no new findings and the images are stable in compared to the last images. She had other questions related to the nephrostomies and this RN referred her back to urology and she stated that she was awaiting a return call from Dr. [name]. She was appreciative of the call and had no other questions or concerns.

## 2018-11-06 ENCOUNTER — Other Ambulatory Visit: Payer: Self-pay

## 2018-11-06 ENCOUNTER — Emergency Department (HOSPITAL_COMMUNITY): Payer: Medicare HMO

## 2018-11-06 ENCOUNTER — Encounter (HOSPITAL_COMMUNITY): Payer: Self-pay

## 2018-11-06 ENCOUNTER — Inpatient Hospital Stay (HOSPITAL_COMMUNITY)
Admission: EM | Admit: 2018-11-06 | Discharge: 2018-11-10 | DRG: 698 | Disposition: A | Discharge status: Home Health | Payer: Medicare HMO | Attending: Internal Medicine | Admitting: Internal Medicine

## 2018-11-06 DIAGNOSIS — K219 Gastro-esophageal reflux disease without esophagitis: Secondary | ICD-10-CM | POA: Diagnosis not present

## 2018-11-06 DIAGNOSIS — I252 Old myocardial infarction: Secondary | ICD-10-CM | POA: Diagnosis not present

## 2018-11-06 DIAGNOSIS — Z87891 Personal history of nicotine dependence: Secondary | ICD-10-CM

## 2018-11-06 DIAGNOSIS — Z8551 Personal history of malignant neoplasm of bladder: Secondary | ICD-10-CM | POA: Diagnosis not present

## 2018-11-06 DIAGNOSIS — R109 Unspecified abdominal pain: Secondary | ICD-10-CM

## 2018-11-06 DIAGNOSIS — G9341 Metabolic encephalopathy: Secondary | ICD-10-CM | POA: Diagnosis present

## 2018-11-06 DIAGNOSIS — A4159 Other Gram-negative sepsis: Secondary | ICD-10-CM | POA: Diagnosis present

## 2018-11-06 DIAGNOSIS — G9340 Encephalopathy, unspecified: Secondary | ICD-10-CM | POA: Diagnosis not present

## 2018-11-06 DIAGNOSIS — N183 Chronic kidney disease, stage 3 unspecified: Secondary | ICD-10-CM | POA: Diagnosis present

## 2018-11-06 DIAGNOSIS — R6520 Severe sepsis without septic shock: Secondary | ICD-10-CM | POA: Diagnosis not present

## 2018-11-06 DIAGNOSIS — B964 Proteus (mirabilis) (morganii) as the cause of diseases classified elsewhere: Secondary | ICD-10-CM | POA: Diagnosis present

## 2018-11-06 DIAGNOSIS — G2581 Restless legs syndrome: Secondary | ICD-10-CM | POA: Diagnosis present

## 2018-11-06 DIAGNOSIS — Z8614 Personal history of Methicillin resistant Staphylococcus aureus infection: Secondary | ICD-10-CM

## 2018-11-06 DIAGNOSIS — R69 Illness, unspecified: Secondary | ICD-10-CM | POA: Diagnosis not present

## 2018-11-06 DIAGNOSIS — Z436 Encounter for attention to other artificial openings of urinary tract: Secondary | ICD-10-CM

## 2018-11-06 DIAGNOSIS — K449 Diaphragmatic hernia without obstruction or gangrene: Secondary | ICD-10-CM | POA: Diagnosis present

## 2018-11-06 DIAGNOSIS — T83511A Infection and inflammatory reaction due to indwelling urethral catheter, initial encounter: Secondary | ICD-10-CM | POA: Diagnosis not present

## 2018-11-06 DIAGNOSIS — I130 Hypertensive heart and chronic kidney disease with heart failure and stage 1 through stage 4 chronic kidney disease, or unspecified chronic kidney disease: Secondary | ICD-10-CM | POA: Diagnosis present

## 2018-11-06 DIAGNOSIS — C679 Malignant neoplasm of bladder, unspecified: Secondary | ICD-10-CM

## 2018-11-06 DIAGNOSIS — I5032 Chronic diastolic (congestive) heart failure: Secondary | ICD-10-CM | POA: Diagnosis not present

## 2018-11-06 DIAGNOSIS — D539 Nutritional anemia, unspecified: Secondary | ICD-10-CM | POA: Diagnosis present

## 2018-11-06 DIAGNOSIS — I2510 Atherosclerotic heart disease of native coronary artery without angina pectoris: Secondary | ICD-10-CM | POA: Diagnosis present

## 2018-11-06 DIAGNOSIS — F331 Major depressive disorder, recurrent, moderate: Secondary | ICD-10-CM | POA: Diagnosis present

## 2018-11-06 DIAGNOSIS — N1330 Unspecified hydronephrosis: Secondary | ICD-10-CM | POA: Diagnosis not present

## 2018-11-06 DIAGNOSIS — E785 Hyperlipidemia, unspecified: Secondary | ICD-10-CM | POA: Diagnosis present

## 2018-11-06 DIAGNOSIS — N401 Enlarged prostate with lower urinary tract symptoms: Secondary | ICD-10-CM | POA: Diagnosis present

## 2018-11-06 DIAGNOSIS — Y846 Urinary catheterization as the cause of abnormal reaction of the patient, or of later complication, without mention of misadventure at the time of the procedure: Secondary | ICD-10-CM | POA: Diagnosis not present

## 2018-11-06 DIAGNOSIS — A498 Other bacterial infections of unspecified site: Secondary | ICD-10-CM | POA: Diagnosis present

## 2018-11-06 DIAGNOSIS — N390 Urinary tract infection, site not specified: Secondary | ICD-10-CM | POA: Diagnosis not present

## 2018-11-06 DIAGNOSIS — Z1623 Resistance to quinolones and fluoroquinolones: Secondary | ICD-10-CM | POA: Diagnosis present

## 2018-11-06 DIAGNOSIS — Z933 Colostomy status: Secondary | ICD-10-CM

## 2018-11-06 DIAGNOSIS — N3000 Acute cystitis without hematuria: Secondary | ICD-10-CM | POA: Diagnosis not present

## 2018-11-06 DIAGNOSIS — F419 Anxiety disorder, unspecified: Secondary | ICD-10-CM

## 2018-11-06 DIAGNOSIS — I5022 Chronic systolic (congestive) heart failure: Secondary | ICD-10-CM | POA: Diagnosis present

## 2018-11-06 DIAGNOSIS — I10 Essential (primary) hypertension: Secondary | ICD-10-CM | POA: Diagnosis present

## 2018-11-06 DIAGNOSIS — D696 Thrombocytopenia, unspecified: Secondary | ICD-10-CM | POA: Diagnosis not present

## 2018-11-06 DIAGNOSIS — A419 Sepsis, unspecified organism: Secondary | ICD-10-CM

## 2018-11-06 DIAGNOSIS — Z87440 Personal history of urinary (tract) infections: Secondary | ICD-10-CM

## 2018-11-06 DIAGNOSIS — Z8249 Family history of ischemic heart disease and other diseases of the circulatory system: Secondary | ICD-10-CM

## 2018-11-06 DIAGNOSIS — T83510A Infection and inflammatory reaction due to cystostomy catheter, initial encounter: Secondary | ICD-10-CM | POA: Diagnosis not present

## 2018-11-06 DIAGNOSIS — N179 Acute kidney failure, unspecified: Secondary | ICD-10-CM | POA: Diagnosis not present

## 2018-11-06 DIAGNOSIS — R509 Fever, unspecified: Secondary | ICD-10-CM

## 2018-11-06 DIAGNOSIS — E871 Hypo-osmolality and hyponatremia: Secondary | ICD-10-CM | POA: Diagnosis present

## 2018-11-06 DIAGNOSIS — K624 Stenosis of anus and rectum: Secondary | ICD-10-CM | POA: Diagnosis not present

## 2018-11-06 DIAGNOSIS — Z951 Presence of aortocoronary bypass graft: Secondary | ICD-10-CM

## 2018-11-06 DIAGNOSIS — E860 Dehydration: Secondary | ICD-10-CM | POA: Diagnosis present

## 2018-11-06 DIAGNOSIS — Z85038 Personal history of other malignant neoplasm of large intestine: Secondary | ICD-10-CM

## 2018-11-06 DIAGNOSIS — Z435 Encounter for attention to cystostomy: Secondary | ICD-10-CM | POA: Diagnosis not present

## 2018-11-06 DIAGNOSIS — K5730 Diverticulosis of large intestine without perforation or abscess without bleeding: Secondary | ICD-10-CM | POA: Diagnosis not present

## 2018-11-06 DIAGNOSIS — Z66 Do not resuscitate: Secondary | ICD-10-CM | POA: Diagnosis present

## 2018-11-06 DIAGNOSIS — Z466 Encounter for fitting and adjustment of urinary device: Secondary | ICD-10-CM | POA: Diagnosis not present

## 2018-11-06 DIAGNOSIS — N3289 Other specified disorders of bladder: Secondary | ICD-10-CM | POA: Diagnosis present

## 2018-11-06 LAB — COMPREHENSIVE METABOLIC PANEL
ALK PHOS: 58 U/L (ref 38–126)
ALT: 13 U/L (ref 0–44)
ANION GAP: 9 (ref 5–15)
AST: 17 U/L (ref 15–41)
Albumin: 3.4 g/dL — ABNORMAL LOW (ref 3.5–5.0)
BILIRUBIN TOTAL: 1.3 mg/dL — AB (ref 0.3–1.2)
BUN: 31 mg/dL — ABNORMAL HIGH (ref 8–23)
CALCIUM: 9 mg/dL (ref 8.9–10.3)
CO2: 20 mmol/L — ABNORMAL LOW (ref 22–32)
Chloride: 104 mmol/L (ref 98–111)
Creatinine, Ser: 1.86 mg/dL — ABNORMAL HIGH (ref 0.61–1.24)
GFR calc Af Amer: 37 mL/min — ABNORMAL LOW (ref >60)
GFR calc non Af Amer: 32 mL/min — ABNORMAL LOW (ref >60)
Glucose, Bld: 93 mg/dL (ref 70–99)
POTASSIUM: 4.6 mmol/L (ref 3.5–5.1)
Sodium: 133 mmol/L — ABNORMAL LOW (ref 135–145)
TOTAL PROTEIN: 6.9 g/dL (ref 6.5–8.1)

## 2018-11-06 LAB — URINALYSIS, ROUTINE W REFLEX MICROSCOPIC
Bilirubin Urine: NEGATIVE
GLUCOSE, UA: NEGATIVE mg/dL
Ketones, ur: NEGATIVE mg/dL
Nitrite: POSITIVE — AB
Protein, ur: >=300 mg/dL — AB
SPECIFIC GRAVITY, URINE: 1.011 (ref 1.005–1.030)
WBC, UA: >50 WBC/hpf — ABNORMAL HIGH (ref 0–5)
pH: 8 (ref 5.0–8.0)

## 2018-11-06 LAB — CBC
HEMATOCRIT: 25.1 % — AB (ref 39.0–52.0)
HEMOGLOBIN: 7.9 g/dL — AB (ref 13.0–17.0)
MCH: 31.9 pg (ref 26.0–34.0)
MCHC: 31.5 g/dL (ref 30.0–36.0)
MCV: 101.2 fL — ABNORMAL HIGH (ref 80.0–100.0)
Platelets: 66 10*3/uL — ABNORMAL LOW (ref 150–400)
RBC: 2.48 MIL/uL — ABNORMAL LOW (ref 4.22–5.81)
RDW: 19.1 % — AB (ref 11.5–15.5)
WBC: 10 10*3/uL (ref 4.0–10.5)
nRBC: 0 % (ref 0.0–0.2)

## 2018-11-06 MED ORDER — SODIUM CHLORIDE 0.9 % IV SOLN
1.0000 g | Freq: Once | INTRAVENOUS | Status: DC
  Filled 2018-11-06 (×2): qty 10

## 2018-11-06 MED ORDER — HYDROMORPHONE HCL 1 MG/ML IJ SOLN
1.0000 mg | Freq: Once | INTRAMUSCULAR | Status: AC
  Administered 2018-11-06: 1 mg via INTRAVENOUS
  Filled 2018-11-06: qty 1

## 2018-11-06 MED ORDER — VANCOMYCIN HCL 10 G IV SOLR
1500.0000 mg | Freq: Once | INTRAVENOUS | Status: AC
  Administered 2018-11-07: 1500 mg via INTRAVENOUS
  Filled 2018-11-06: qty 1500

## 2018-11-06 MED ORDER — VANCOMYCIN HCL IN DEXTROSE 1-5 GM/200ML-% IV SOLN: 1000.0000 mg | INTRAVENOUS | Status: DC

## 2018-11-06 MED ORDER — SODIUM CHLORIDE 0.9% FLUSH
10.0000 mL | INTRAVENOUS | Status: DC | PRN
  Administered 2018-11-09: 10 mL
  Filled 2018-11-06: qty 40

## 2018-11-06 MED ORDER — OLANZAPINE 10 MG PO TBDP
10.0000 mg | ORAL_TABLET | Freq: Once | ORAL | Status: AC
  Administered 2018-11-06: 10 mg via ORAL
  Filled 2018-11-06: qty 1

## 2018-11-06 MED ORDER — SODIUM CHLORIDE 0.9 % IV BOLUS
500.0000 mL | Freq: Once | INTRAVENOUS | Status: AC
  Administered 2018-11-06: 500 mL via INTRAVENOUS

## 2018-11-06 MED ORDER — SODIUM CHLORIDE 0.9 % IV BOLUS
1000.0000 mL | Freq: Once | INTRAVENOUS | Status: AC
  Administered 2018-11-06: 1000 mL via INTRAVENOUS

## 2018-11-06 MED ORDER — ALTEPLASE 2 MG IJ SOLR
2.0000 mg | Freq: Once | INTRAMUSCULAR | Status: AC
  Administered 2018-11-06: 2 mg
  Filled 2018-11-06: qty 2

## 2018-11-06 MED ORDER — ACETAMINOPHEN 325 MG PO TABS
650.0000 mg | ORAL_TABLET | Freq: Once | ORAL | Status: AC
  Administered 2018-11-06: 650 mg via ORAL
  Filled 2018-11-06: qty 2

## 2018-11-06 MED ORDER — CEFTRIAXONE SODIUM 1 G IJ SOLR
1.0000 g | Freq: Once | INTRAMUSCULAR | Status: AC
  Administered 2018-11-06: 1 g via INTRAMUSCULAR
  Filled 2018-11-06: qty 10

## 2018-11-06 MED ORDER — ENOXAPARIN SODIUM 30 MG/0.3ML ~~LOC~~ SOLN
30.0000 mg | SUBCUTANEOUS | Status: DC
  Administered 2018-11-07: 30 mg via SUBCUTANEOUS
  Filled 2018-11-06: qty 0.3

## 2018-11-06 MED ORDER — STERILE WATER FOR INJECTION IJ SOLN
INTRAMUSCULAR | Status: AC
  Administered 2018-11-06: 20:00:00
  Filled 2018-11-06: qty 10

## 2018-11-06 MED ORDER — HYDROMORPHONE HCL 1 MG/ML IJ SOLN: 0.5000 mg | INTRAMUSCULAR | Status: DC | PRN

## 2018-11-06 MED ORDER — SODIUM CHLORIDE 0.9 % IV SOLN
2.0000 g | Freq: Every day | INTRAVENOUS | Status: DC
  Administered 2018-11-06: 2 g via INTRAVENOUS
  Filled 2018-11-06 (×2): qty 2

## 2018-11-06 MED ORDER — OXYCODONE-ACETAMINOPHEN 5-325 MG PO TABS
1.0000 | ORAL_TABLET | Freq: Once | ORAL | Status: AC
  Administered 2018-11-06: 1 via ORAL
  Filled 2018-11-06: qty 1

## 2018-11-06 MED ORDER — OXYCODONE-ACETAMINOPHEN 5-325 MG PO TABS
1.0000 | ORAL_TABLET | Freq: Four times a day (QID) | ORAL | Status: DC | PRN
  Administered 2018-11-08 – 2018-11-10 (×3): 1 via ORAL
  Filled 2018-11-06 (×3): qty 1

## 2018-11-06 MED ORDER — LIDOCAINE HCL 1 % IJ SOLN
INTRAMUSCULAR | Status: AC
  Administered 2018-11-06: 2.1 mL
  Filled 2018-11-06: qty 20

## 2018-11-06 NOTE — ED Triage Notes (Signed)
Pt has hx of bladder cancer, with spasms. Pt gets frequent UTIs as well. Pt is having bladder spasms and fevers today. Fever was 101 at home- pt was given tylenol and cold compresses. Spasm pain had pt doubled over in pain. Pt is also having leakage out of penis, and he never voids.

## 2018-11-06 NOTE — ED Notes (Signed)
Pyxis out of Rocephin, pharmacy called, sending some up in tubing system.

## 2018-11-06 NOTE — ED Notes (Signed)
Staff has attempted to draw blood from porta cath several times, small amount then stops, Dr Venora Maples aware and has placed IV consult due to possible declot meds needed.

## 2018-11-06 NOTE — ED Notes (Signed)
Attempt once to collect labs unsuccessful  

## 2018-11-06 NOTE — ED Notes (Signed)
ED TO INPATIENT HANDOFF REPORT  Name/Age/Gender Charles Coffey 83 y.o. male  Code Status    Code Status Orders  (From admission, onward)         Start     Ordered   11/06/18 2318  Do not attempt resuscitation (DNR)  Continuous    Question Answer Comment  In the event of cardiac or respiratory ARREST Do not call a "code blue"   In the event of cardiac or respiratory ARREST Do not perform Intubation, CPR, defibrillation or ACLS   In the event of cardiac or respiratory ARREST Use medication by any route, position, wound care, and other measures to relive pain and suffering. May use oxygen, suction and manual treatment of airway obstruction as needed for comfort.      11/06/18 2320        Code Status History    Date Active Date Inactive Code Status Order ID Comments User Context   08/19/2018 0341 08/24/2018 1818 DNR [id number]  Vianne Bulls, MD Inpatient   07/05/2018 0824 07/06/2018 1611 DNR [id number]  Aline August, MD ED   06/30/2018 2318 07/04/2018 2143 Full Code [id number]  Jani Gravel, MD Inpatient   02/17/2018 0045 02/17/2018 2003 Full Code [id number]  Etta Quill, DO ED   01/31/2018 0911 02/05/2018 1516 Full Code [id number]  Phillips Grout, MD Inpatient   11/28/2017 2136 12/04/2017 2050 Full Code [id number]  Eugenie Filler, MD ED   11/25/2017 1510 11/26/2017 1608 Full Code [id number]  Irine Seal, MD Inpatient   08/25/2017 1845 08/26/2017 1906 Full Code [id number]  Wellington Hampshire, MD Inpatient      Home/SNF/Other Home  Chief Complaint Oncology Pt. Bladder Spasms./Chemo Card  Level of Care/Admitting Diagnosis ED Disposition    ED Disposition Condition Comment   Admit  Hospital Area: [facility] [[id number]]  Level of Care: Telemetry [5]  Admit to tele based on following criteria: Other see comments  Comments: hx of CHF  Diagnosis: UTI (urinary tract infection) [970263]  Admitting Physician: Colbert Ewing [[id number]]  Attending Physician: Colbert Ewing  [[id number]]  Estimated length of stay: past midnight tomorrow  Certification:: I certify this patient will need inpatient services for at least 2 midnights  PT Class (Do Not Modify): Inpatient [101]  PT Acc Code (Do Not Modify): Private [1]       Medical History Past Medical History:  Diagnosis Date  . Aortic atherosclerosis ([place])   . BPH with urinary obstruction   . CAD (coronary artery disease)    a.  MI 1995, CABG x 3 2002 (patient says that he had LIMA and RIMA grafts). b. ETT-Cardiolite (10/15) with EF 48%, apical scar, no ischemia. c. Nuc 07/2017 abnormal -> cath was performed,  patent LIMA to LAD and SVG to OM 2. RIMA to RCA is atretic. The right coronary artery is occluded distally with extensive collaterals from the LAD, medical therapy.   . Cancer [facility])    bladder cancer  . Cervical spondylosis without myelopathy Dec 13, 202017  . Chronic low back pain    sees Dr. Kary Kos   . GERD (gastroesophageal reflux disease)   . Hiatal hernia   . Hyperlipidemia   . Hypertension   . IBS (irritable bowel syndrome)   . Ischemic cardiomyopathy   . Myocardial infarction ([place])   . RBBB   . Restless legs syndrome   . Statin intolerance   . Syncope    a. in 2015 - no apparent cause, was  taking sleep medicine at the time. Cardiac workup unremarkable.  . Tubular adenoma of colon     Allergies Allergies  Allergen Reactions  . Statins Other (See Comments)    liver effects  . Cyproheptadine Other (See Comments)    Unable to urinate    IV Location/Drains/Wounds Patient Lines/Drains/Airways Status   Active Line/Drains/Airways    Name:   Placement date:   Placement time:   Site:   Days:   Implanted Port 12/27/17 Right Chest   12/27/17    1630    Chest   314   Colostomy LLQ   -    -    LLQ      Suprapubic Catheter   08/19/18    1300    -   79          Labs/Imaging Results for orders placed or performed during the hospital encounter of 11/06/18 (from the past 48 hour(s))  CBC      Status: Abnormal   Collection Time: 11/06/18  7:58 PM  Result Value Ref Range   WBC 10.0 4.0 - 10.5 K/uL   RBC 2.48 (L) 4.22 - 5.81 MIL/uL   Hemoglobin 7.9 (L) 13.0 - 17.0 g/dL   HCT 25.1 (L) 39.0 - 52.0 %   MCV 101.2 (H) 80.0 - 100.0 fL   MCH 31.9 26.0 - 34.0 pg   MCHC 31.5 30.0 - 36.0 g/dL   RDW 19.1 (H) 11.5 - 15.5 %   Platelets 66 (L) 150 - 400 K/uL    Comment: REPEATED TO VERIFY PLATELET COUNT CONFIRMED BY SMEAR SPECIMEN CHECKED FOR CLOTS Immature Platelet Fraction may be clinically indicated, consider ordering this additional test [id number]    nRBC 0.0 0.0 - 0.2 %    Comment: Performed at [facility], [place] [address] [place], [place] [address]  Comprehensive metabolic panel     Status: Abnormal   Collection Time: 11/06/18  7:58 PM  Result Value Ref Range   Sodium 133 (L) 135 - 145 mmol/L   Potassium 4.6 3.5 - 5.1 mmol/L   Chloride 104 98 - 111 mmol/L   CO2 20 (L) 22 - 32 mmol/L   Glucose, Bld 93 70 - 99 mg/dL   BUN 31 (H) 8 - 23 mg/dL   Creatinine, Ser 1.86 (H) 0.61 - 1.24 mg/dL   Calcium 9.0 8.9 - 10.3 mg/dL   Total Protein 6.9 6.5 - 8.1 g/dL   Albumin 3.4 (L) 3.5 - 5.0 g/dL   AST 17 15 - 41 U/L   ALT 13 0 - 44 U/L   Alkaline Phosphatase 58 38 - 126 U/L   Total Bilirubin 1.3 (H) 0.3 - 1.2 mg/dL   GFR calc non Af Amer 32 (L) >60 mL/min   GFR calc Af Amer 37 (L) >60 mL/min    Comment: (NOTE) The eGFR has been calculated using the CKD EPI equation. This calculation has not been validated in all clinical situations. eGFR's persistently <60 mL/min signify possible Chronic Kidney Disease.    Anion gap 9 5 - 15    Comment: Performed at [facility], [place] [address] [place], [place] [address]  Urinalysis, Routine w reflex microscopic     Status: Abnormal   Collection Time: 11/06/18  8:41 PM  Result Value Ref Range   Color, Urine YELLOW YELLOW   APPearance HAZY (A) CLEAR   Specific Gravity, Urine 1.011 1.005 - 1.030   pH 8.0  5.0 - 8.0   Glucose,  UA NEGATIVE NEGATIVE mg/dL   Hgb urine dipstick SMALL (A) NEGATIVE   Bilirubin Urine NEGATIVE NEGATIVE   Ketones, ur NEGATIVE NEGATIVE mg/dL   Protein, ur >=300 (A) NEGATIVE mg/dL   Nitrite POSITIVE (A) NEGATIVE   Leukocytes, UA LARGE (A) NEGATIVE   RBC / HPF 21-50 0 - 5 RBC/hpf   WBC, UA >50 (H) 0 - 5 WBC/hpf   Bacteria, UA FEW (A) NONE SEEN   Squamous Epithelial / LPF 0-5 0 - 5   WBC Clumps PRESENT    Mucus PRESENT     Comment: Performed at [facility], [place] [address], [place], [place] [address]   Dg Chest Portable 1 View  Result Date: 11/06/2018 CLINICAL DATA:  Altered mental status.  Fever. EXAM: PORTABLE CHEST 1 VIEW COMPARISON:  October 10, 2018 FINDINGS: The right Port-A-Cath is stable. Cardiomegaly is similar in the me interval. The hila and mediastinum are unchanged. No pneumothorax. No other acute abnormalities. IMPRESSION: Stable Port-A-Cath.  No cause for fever noted. Electronically Signed   By: Dorise Bullion III M.D   On: 11/06/2018 23:12   Ct Renal Stone Study  Result Date: 11/06/2018 CLINICAL DATA:  83 year old male with history of abdominal pain, fever and bladder spasms. Recurrent urinary tract infections. History of bladder cancer. EXAM: CT ABDOMEN AND PELVIS WITHOUT CONTRAST TECHNIQUE: Multidetector CT imaging of the abdomen and pelvis was performed following the standard protocol without IV contrast. COMPARISON:  CT the abdomen and pelvis 10/24/2018. FINDINGS: Lower chest: Atherosclerotic calcifications in the right coronary artery. Small right and trace left pleural effusions lying dependently. Hepatobiliary: No definite suspicious cystic or solid hepatic lesions are confidently identified on today's noncontrast CT examination. Unenhanced appearance of the gallbladder is normal. Pancreas: No definite pancreatic mass or peripancreatic fluid or inflammatory changes noted on today's noncontrast CT examination. Spleen: Unremarkable.  Adrenals/Urinary Tract: Bilateral nephrostomy tubes in position, with extension catheters extending into the urinary bladder. Suprapubic catheter extending into the urinary bladder. Left extrarenal pelvis. Bilateral adrenal glands are normal in appearance. Other than the indwelling catheters, urinary bladder is otherwise normal in appearance. Stomach/Bowel: Unenhanced appearance of the stomach is normal. No pathologic dilatation of small bowel or colon. Numerous colonic diverticulae are noted, without surrounding inflammatory changes to suggest an acute diverticulitis at this time. Left upper quadrant colostomy. Normal appendix. Vascular/Lymphatic: Aortic atherosclerosis. No lymphadenopathy noted in the abdomen or pelvis. Reproductive: Prostate gland and seminal vesicles are unremarkable in appearance. Other: Trace volume of ascites.  No pneumoperitoneum. Musculoskeletal: Status post PLIF at L3-L4. There are no aggressive appearing lytic or blastic lesions noted in the visualized portions of the skeleton. IMPRESSION: 1. No acute findings are noted in the abdomen or pelvis to account for the patient's symptoms. 2. Colonic diverticulosis without evidence of acute diverticulitis at this time. 3. Small right and trace left pleural effusions lying dependently. 4. Aortic atherosclerosis, in addition to at least right coronary artery disease. 5. Additional incidental findings, similar prior study, as above. Electronically Signed   By: Vinnie Langton M.D.   On: 11/06/2018 18:39    Pending Labs Unresulted Labs (From admission, onward)    Start     Ordered   11/13/18 0500  Creatinine, serum  (enoxaparin (LOVENOX)    CrCl < 30 ml/min)  Weekly,   R    Comments:  while on enoxaparin therapy.    11/06/18 2320   11/07/18 0500  CBC with Differential/Platelet  Daily,   R     11/06/18  2320   11/07/18 1497  Basic metabolic panel  Daily,   R     11/06/18 2320   11/07/18 0500  Magnesium  Daily,   R     11/06/18 2320    11/06/18 2317  CBC  (enoxaparin (LOVENOX)    CrCl < 30 ml/min)  Once,   R    Comments:  Baseline for enoxaparin therapy IF NOT ALREADY DRAWN.  Notify MD if PLT < 100 K.    11/06/18 2320   11/06/18 2317  Creatinine, serum  (enoxaparin (LOVENOX)    CrCl < 30 ml/min)  Once,   R    Comments:  Baseline for enoxaparin therapy IF NOT ALREADY DRAWN.    11/06/18 2320   11/06/18 2250  Influenza panel by PCR (type A & B)  (Influenza PCR Panel)  Once,   R     11/06/18 2249   11/06/18 2214  Urine culture  Add-on,   STAT     11/06/18 2213   11/06/18 1710  Blood culture (routine x 2)  BLOOD CULTURE X 2,   STAT     11/06/18 1709          Vitals/Pain Today's Vitals   11/06/18 2246 11/06/18 2300 11/06/18 2340 11/06/18 2349  BP:  134/76  131/71  Pulse:  (!) 109  (!) 105  Resp:  16  18  Temp: (!) 103.2 F (39.6 C)     TempSrc: Rectal     SpO2:  95%  90%  Weight:      Height:      PainSc:   10-Worst pain ever     Isolation Precautions Droplet precaution  Medications Medications  sodium chloride flush (NS) 0.9 % injection 10-40 mL (has no administration in time range)  enoxaparin (LOVENOX) injection 30 mg (has no administration in time range)  ceFEPIme (MAXIPIME) 2 g in sodium chloride 0.9 % 100 mL IVPB (2 g Intravenous New Bag/Given 11/06/18 2345)  HYDROmorphone (DILAUDID) injection 0.5 mg (has no administration in time range)  oxyCODONE-acetaminophen (PERCOCET/ROXICET) 5-325 MG per tablet 1 tablet (has no administration in time range)  vancomycin (VANCOCIN) 1,500 mg in sodium chloride 0.9 % 500 mL IVPB (has no administration in time range)  vancomycin (VANCOCIN) IVPB 1000 mg/200 mL premix (has no administration in time range)  sodium chloride 0.9 % bolus 500 mL (0 mLs Intravenous Stopped 11/06/18 1904)  alteplase (CATHFLO ACTIVASE) injection 2 mg (2 mg Intracatheter Given 11/06/18 2012)  sterile water (preservative free) injection (  Given 11/06/18 2012)  sodium chloride 0.9 % bolus  1,000 mL (0 mLs Intravenous Stopped 11/06/18 2332)  cefTRIAXone (ROCEPHIN) injection 1 g (1 g Intramuscular Given 11/06/18 2223)  lidocaine (XYLOCAINE) 1 % (with pres) injection (2.1 mLs  Given 11/06/18 2223)  oxyCODONE-acetaminophen (PERCOCET/ROXICET) 5-325 MG per tablet 1 tablet (1 tablet Oral Given 11/06/18 2237)  acetaminophen (TYLENOL) tablet 650 mg (650 mg Oral Given 11/06/18 2339)  HYDROmorphone (DILAUDID) injection 1 mg (1 mg Intravenous Given 11/06/18 2341)  OLANZapine zydis (ZYPREXA) disintegrating tablet 10 mg (10 mg Oral Given 11/06/18 2339)    Mobility walks with person assist

## 2018-11-06 NOTE — ED Notes (Signed)
Attempted to obtain labs x2. Unable, RN aware

## 2018-11-06 NOTE — ED Notes (Signed)
One set of blood cultures obtained by Dr. Radene Gunning, unable to obtain second set.

## 2018-11-06 NOTE — ED Notes (Signed)
Patient transported to CT 

## 2018-11-06 NOTE — ED Notes (Signed)
IV team at bedside 

## 2018-11-06 NOTE — Progress Notes (Signed)
Pharmacy Antibiotic Note  NEILSON OEHLERT is a 83 y.o. male admitted on 11/06/2018 with sepsis.  Pharmacy has been consulted for Vancomycin, cefepime dosing.  Plan: Cefepime 2gm iv q24hr  Vancomycin 1500mg  iv x1, then 1gm iv q36hr  Goal AUC = 400 - 500 for all indications, except meningitis (goal AUC > 500 and Cmin 15-20 mcg/mL)   Height: 5\' 8"  (172.7 cm) Weight: 180 lb (81.6 kg) IBW/kg (Calculated) : 68.4  Temp (24hrs), Avg:101.1 F (38.4 C), Min:98.9 F (37.2 C), Max:103.2 F (39.6 C)  Recent Labs  Lab 11/06/18 1958  WBC 10.0  CREATININE 1.86*    Estimated Creatinine Clearance: 30.1 mL/min (A) (by C-G formula based on SCr of 1.86 mg/dL (H)).    Allergies  Allergen Reactions  . Statins Other (See Comments)    liver effects  . Cyproheptadine Other (See Comments)    Unable to urinate    Antimicrobials this admission: Vancomycin 11/06/2018 >> Cefepime 11/06/2018 >>   Dose adjustments this admission: -  Microbiology results: -  Thank you for allowing pharmacy to be a part of this patient's care.  Nani Skillern Crowford 11/06/2018 11:29 PM

## 2018-11-06 NOTE — Progress Notes (Signed)
PHARMACY NOTE:  ANTIMICROBIAL RENAL DOSAGE ADJUSTMENT  Current antimicrobial regimen includes a mismatch between antimicrobial dosage and estimated renal function.  As per policy approved by the Pharmacy & Therapeutics and Medical Executive Committees, the antimicrobial dosage will be adjusted accordingly.  Current antimicrobial dosage:  Cefepime 1gm iv q12hr  Indication: Urosepsis  Renal Function:  Estimated Creatinine Clearance: 30.1 mL/min (A) (by C-G formula based on SCr of 1.86 mg/dL (H)). []      On intermittent HD, scheduled: []      On CRRT    Antimicrobial dosage has been changed to:  Cefepime 2gm iv q24hr  Additional comments:   Thank you for allowing pharmacy to be a part of this patient's care.  [place], [place], [facility] 11/06/2018 11:23 PM

## 2018-11-06 NOTE — ED Notes (Signed)
Alteplase in at 8:12 pm, will be able to access/ use at 11:12 pm.

## 2018-11-06 NOTE — ED Provider Notes (Signed)
[facility] DEPT Provider Note   CSN: [id number] Arrival date & time: 11/06/18  1635     History   Chief Complaint Chief Complaint  Patient presents with  . Bladder Cancer    HPI FABIO WAH is a 83 y.o. male.  HPI Patient is an 82 year old male with a history of bladder cancer who presents to the emergency department with complaints of chills and increasing suprapubic pain over the past 24 to 48 hours.  Wife reports some mild confusion today.  He has bilateral nephroureterostomy tubes and suprapubic catheter.  [facility] by [facility].  Nephroureterostomy tubes were placed by interventional radiology and are due for exchange soon.  No cough.  Chills without documented fever at home.  Mild altered mental status.  No complaints of upper abdominal pain.  No complaints of chest pain or shortness of breath.  No unilateral leg swelling.  No rash.  Wife reports this is normally how he acts when he is developing a urinary tract infection.   Past Medical History:  Diagnosis Date  . Aortic atherosclerosis ([place])   . BPH with urinary obstruction   . CAD (coronary artery disease)    a.  MI 1995, CABG x 3 2002 (patient says that he had LIMA and RIMA grafts). b. ETT-Cardiolite (10/15) with EF 48%, apical scar, no ischemia. c. Nuc 07/2017 abnormal -> cath was performed,  patent LIMA to LAD and SVG to OM 2. RIMA to RCA is atretic. The right coronary artery is occluded distally with extensive collaterals from the LAD, medical therapy.   . Cancer [facility])    bladder cancer  . Cervical spondylosis without myelopathy 06-08-202017  . Chronic low back pain    sees Dr. Kary Kos   . GERD (gastroesophageal reflux disease)   . Hiatal hernia   . Hyperlipidemia   . Hypertension   . IBS (irritable bowel syndrome)   . Ischemic cardiomyopathy   . Myocardial infarction ([facility])   . RBBB   . Restless legs syndrome   . Statin intolerance   . Syncope    a. in 2015 - no  apparent cause, was taking sleep medicine at the time. Cardiac workup unremarkable.  . Tubular adenoma of colon     Patient Active Problem List   Diagnosis Date Noted  . Cancer associated pain   . Palliative care by specialist   . DNR (do not resuscitate)   . Agitation   . Sepsis due to urinary tract infection ([place]) 08/19/2018  . Hyponatremia 08/19/2018  . Ventral hernia without obstruction or gangrene 07/21/2018  . Displacement of nephrostomy tube ([place]) 07/05/2018  . Nephrostomy tube displaced ([place]) 07/05/2018  . Malfunction of nephrostomy tube ([place])   . Lower urinary tract infectious disease 06/30/2018  . Port-A-Cath in place 03/31/2018  . PVC (premature ventricular contraction) 02/25/2018  . Malignant neoplasm of urinary bladder ([place]) 02/25/2018  . Catheter-associated urinary tract infection ([place]) 02/17/2018  . Nephrostomy complication ([place]) 99/83/3825  . Chronic systolic heart failure ([place]) 02/07/2018  . Malnutrition of moderate degree 02/01/2018  . Acute respiratory failure with hypoxia ([place]) 01/31/2018  . Acute CHF (congestive heart failure) ([place]) 01/31/2018  . Thrombocytopathia ([place])   . DVT (deep venous thrombosis) ([place]) 01/11/2018  . Sepsis secondary to UTI ([place]) 01/01/2018  . Colostomy in place [facility]) 12/04/2017  . Rectal stenosis   . Low vitamin B12 level 11/30/2017  . Irritable bowel syndrome with constipation   . Pyelonephritis 11/28/2017  .  Fever 11/28/2017  . Ileus ([place]) 11/28/2017  . Elevated troponin 11/28/2017  . Urothelial carcinoma of bladder ([place]) 11/28/2017  . Dehydration 11/28/2017  . Thrombocytopenia ([place]) 11/28/2017  . Hydronephrosis due to obstructive malignant neoplasm of bladder ([place]) 11/25/2017  . Anemia 09/20/2017  . Chronic kidney disease, stage 3 ([place]) 09/20/2017  . Angina pectoris ([place])   . Abnormal stress test   . Cervical spondylosis without myelopathy 2020/11/1616  . PAD (peripheral artery disease) ([place]) 03/04/2015  . Coronary artery  disease involving native heart without angina pectoris 11/01/2014  . Acid reflux 11/01/2014  . H/O male genital system disorder 11/01/2014  . Restless leg 11/01/2014  . Acne erythematosa 11/01/2014  . Syncope 09/24/2014  . Chronic low back pain 11/10/2013  . Cardiomyopathy, ischemic 11/29/2012  . Hyperlipidemia LDL goal <70 10/15/2010  . RESTLESS LEGS SYNDROME 10/15/2010  . Essential hypertension 10/15/2010  . MYOCARDIAL INFARCTION, HX OF 10/15/2010  . Coronary artery disease of native heart with stable angina pectoris ([place]) 10/15/2010  . GERD 10/15/2010  . BENIGN PROSTATIC HYPERTROPHY 10/15/2010  . BENIGN PROSTATIC HYPERTROPHY, WITH OBSTRUCTION 10/15/2010  . COLONIC POLYPS, HX OF 10/15/2010    Past Surgical History:  Procedure Laterality Date  . COLONOSCOPY  10/17/2014   per Dr. Hilarie Fredrickson, tubular adenomas, repeat in 3 yrs   . COLONOSCOPY WITH PROPOFOL N/A 12/01/2017   Procedure: COLONOSCOPY WITH PROPOFOL;  Surgeon: Milus Banister, MD;  Location: [facility];  Service: Endoscopy;  Laterality: N/A;  . CYSTOSCOPY W/ RETROGRADES Left 11/25/2017   Procedure: CYSTOSCOPY WITH RETROGRADE PYELOGRAM;  Surgeon: Irine Seal, MD;  Location: [facility];  Service: Urology;  Laterality: Left;  . HEART BYPASS    . IR CONVERT LEFT NEPHROSTOMY TO NEPHROURETERAL CATH  09/20/2018  . IR CONVERT RIGHT NEPHROSTOMY TO NEPHROURETERAL CATH  02/04/2018  . IR CONVERT RIGHT NEPHROSTOMY TO NEPHROURETERAL CATH  08/23/2018  . IR EXT NEPHROURETERAL CATH EXCHANGE  04/11/2018  . IR EXT NEPHROURETERAL CATH EXCHANGE  07/04/2018  . IR EXT NEPHROURETERAL CATH EXCHANGE  09/20/2018  . IR FLUORO GUIDE CV LINE RIGHT  12/27/2017  . IR FLUORO GUIDED NEEDLE PLC ASPIRATION/INJECTION LOC  08/19/2018  . IR NEPHROSTOGRAM LEFT THRU EXISTING ACCESS  12/24/2017  . IR NEPHROSTOMY EXCHANGE LEFT  01/28/2018  . IR NEPHROSTOMY EXCHANGE LEFT  02/04/2018  . IR NEPHROSTOMY EXCHANGE LEFT  02/25/2018  . IR NEPHROSTOMY EXCHANGE LEFT  07/04/2018  . IR  NEPHROSTOMY EXCHANGE LEFT  07/05/2018  . IR NEPHROSTOMY EXCHANGE LEFT  08/09/2018  . IR NEPHROSTOMY EXCHANGE LEFT  08/16/2018  . IR NEPHROSTOMY EXCHANGE LEFT  08/23/2018  . IR NEPHROSTOMY EXCHANGE RIGHT  12/24/2017  . IR NEPHROSTOMY EXCHANGE RIGHT  01/28/2018  . IR NEPHROSTOMY EXCHANGE RIGHT  04/11/2018  . IR NEPHROSTOMY EXCHANGE RIGHT  07/05/2018  . IR NEPHROSTOMY EXCHANGE RIGHT  08/09/2018  . IR NEPHROSTOMY EXCHANGE RIGHT  08/19/2018  . IR NEPHROSTOMY PLACEMENT LEFT  11/28/2017  . IR NEPHROSTOMY PLACEMENT RIGHT  12/03/2017  . IR PATIENT EVAL TECH 0-60 MINS  03/24/2018  . IR PATIENT EVAL TECH 0-60 MINS  03/28/2018  . IR PATIENT EVAL TECH 0-60 MINS  04/25/2018  . IR PATIENT EVAL TECH 0-60 MINS  06/20/2018  . IR US GUIDE VASC ACCESS RIGHT  12/27/2017  . LAPAROSCOPY N/A 12/01/2017   Procedure: LAPAROSCOPIC DIVERTING OSTOMY;  Surgeon: Stark Klein, MD;  Location: [facility];  Service: General;  Laterality: N/A;  . LEFT HEART CATH AND CORS/GRAFTS ANGIOGRAPHY N/A 08/25/2017   Procedure: LEFT HEART CATH  AND CORS/GRAFTS ANGIOGRAPHY;  Surgeon: Wellington Hampshire, MD;  Location: [facility] CV LAB;  Service: Cardiovascular;  Laterality: N/A;  . LUMBAR FUSION  2003   L3-L4  . PROSTATE SURGERY  06-27-12   per Dr. Roni Bread, had CTT  . TONSILLECTOMY    . TRANSURETHRAL RESECTION OF BLADDER TUMOR N/A 11/25/2017   Procedure: TRANSURETHRAL RESECTION OF BLADDER TUMOR (TURBT);  Surgeon: Irine Seal, MD;  Location: [facility];  Service: Urology;  Laterality: N/A;        Home Medications    Prior to Admission medications   Medication Sig Start Date End Date Taking? Authorizing Provider  carvedilol (COREG) 3.125 MG tablet Take 3.125 mg by mouth 2 (two) times daily with a meal.   Yes [provider]  DULoxetine (CYMBALTA) 20 MG capsule Take 1 capsule (20 mg total) by mouth 2 (two) times daily. Take once daily for 2 weeks then increase to twice daily Patient taking differently: Take 20 mg by mouth 2 (two) times daily.  08/12/18   Yes Tanner, Lyndon Code., PA-C  famotidine (PEPCID) 20 MG tablet Take 1 tablet (20 mg total) by mouth 2 (two) times daily. 10/18/18  Yes Wyatt Portela, MD  famotidine (PEPCID) 20 MG tablet Take 20 mg by mouth 2 (two) times daily.   Yes [provider]  furosemide (LASIX) 40 MG tablet Take 1 tablet (40 mg total) by mouth 2 (two) times daily. Patient taking differently: Take 40 mg by mouth daily as needed for fluid or edema.  05/13/18  Yes End, Harrell Gave, MD  hydrOXYzine (ATARAX/VISTARIL) 25 MG tablet Take 1 tablet (25 mg total) by mouth every 4 (four) hours as needed for itching. Patient taking differently: Take 25 mg by mouth 2 (two) times daily.  07/21/18  Yes [name], MD  lidocaine-prilocaine (EMLA) cream Apply 1 application topically as needed. 10/18/18  Yes Wyatt Portela, MD  nitroGLYCERIN (NITROSTAT) 0.4 MG SL tablet PLACE 1 TABLET (0.4 MG TOTAL) UNDER THE TONGUE EVERY 5 (FIVE) MINUTES AS NEEDED. 09/27/18 12/26/18 Yes Dunn, Dayna N, PA-C  OLANZapine (ZYPREXA) 5 MG tablet Take 0.5 tablets (2.5 mg total) by mouth at bedtime. 10/10/18  Yes Tanner, Lyndon Code., PA-C  oxyCODONE (OXYCONTIN) 10 mg 12 hr tablet Take 1 tablet (10 mg total) by mouth 2 (two) times daily. Patient taking differently: Take 10 mg by mouth at bedtime.  07/22/18  Yes Wyatt Portela, MD  oxyCODONE-acetaminophen (PERCOCET) 10-325 MG tablet Take 1 tablet by mouth every 4 (four) hours as needed for pain. 10/27/18  Yes Wyatt Portela, MD  pramipexole (MIRAPEX) 1 MG tablet TAKE 1 AND 1/2 TABLETS BY MOUTH AT BEDTIME Patient taking differently: Take 1.5 mg by mouth every evening.  06/29/18  Yes [name], MD  carvedilol (COREG) 3.125 MG tablet Take 1 tablet (3.125 mg total) by mouth 2 (two) times daily. 07/04/18 09/20/18  Donne Hazel, MD  feeding supplement (BOOST HIGH PROTEIN) LIQD Take 1 Container by mouth 2 (two) times daily between meals.    [provider]  megestrol (MEGACE) 40 MG/ML suspension TAKE 10ML BY  MOUTH TWICE A DAY Patient not taking: Reported on 09/21/2018 05/13/18   Wyatt Portela, MD  polyvinyl alcohol (LIQUIFILM TEARS) 1.4 % ophthalmic solution Place 1 drop into both eyes 2 (two) times daily as needed (dry eyes). Patient not taking: Reported on 11/06/2018 08/24/18   Nita Sells, MD  ranitidine (ZANTAC) 300 MG tablet TAKE 1 TABLET BY MOUTH TWICE A  DAY Patient not taking: Reported on 10/10/2018 09/07/18   Jerene Bears, MD  triamcinolone cream (KENALOG) 0.1 % Apply topically 3 (three) times daily. To affected areas 07/04/18   Donne Hazel, MD    Family History Family History  Problem Relation Age of Onset  . Heart attack Mother   . Aneurysm Father        femoral artery  . Heart disease Brother   . Heart disease Maternal Uncle        x 2  . Colon cancer Neg Hx   . Esophageal cancer Neg Hx   . Pancreatic cancer Neg Hx   . Kidney disease Neg Hx   . Liver disease Neg Hx     Social History Social History   Tobacco Use  . Smoking status: Former Smoker    Types: Cigarettes    Last attempt to quit: 12/28/1978    Years since quitting: 39.8  . Smokeless tobacco: Never Used  Substance Use Topics  . Alcohol use: Not Currently    Alcohol/week: 2.0 standard drinks    Types: 1 Glasses of wine, 1 Cans of beer per week    Comment: occassional  . Drug use: No     Allergies   Statins and Cyproheptadine   Review of Systems Review of Systems  All other systems reviewed and are negative.    Physical Exam Updated Vital Signs BP 125/79   Pulse (!) 110   Temp (!) 103.2 F (39.6 C) (Rectal)   Resp 16   Ht 5\' 8"  (1.727 m)   Wt 81.6 kg   SpO2 95%   BMI 27.37 kg/m   Physical Exam  Constitutional: He is oriented to person, place, and time. He appears well-developed and well-nourished.  HENT:  Head: Normocephalic and atraumatic.  Eyes: EOM are normal.  Neck: Normal range of motion.  Cardiovascular: Normal rate, regular rhythm and normal heart sounds.    Pulmonary/Chest: Effort normal and breath sounds normal. No respiratory distress.  Abdominal: Soft. He exhibits no distension. There is no tenderness.  Musculoskeletal: Normal range of motion.  Neurological: He is alert and oriented to person, place, and time.  Skin: Skin is warm and dry.  Psychiatric: He has a normal mood and affect. Judgment normal.  Nursing note and vitals reviewed.    ED Treatments / Results  Labs (all labs ordered are listed, but only abnormal results are displayed) Labs Reviewed  CBC - Abnormal; Notable for the following components:      Result Value   RBC 2.48 (*)    Hemoglobin 7.9 (*)    HCT 25.1 (*)    MCV 101.2 (*)    RDW 19.1 (*)    Platelets 66 (*)    All other components within normal limits  COMPREHENSIVE METABOLIC PANEL - Abnormal; Notable for the following components:   Sodium 133 (*)    CO2 20 (*)    BUN 31 (*)    Creatinine, Ser 1.86 (*)    Albumin 3.4 (*)    Total Bilirubin 1.3 (*)    GFR calc non Af Amer 32 (*)    GFR calc Af Amer 37 (*)    All other components within normal limits  URINALYSIS, ROUTINE W REFLEX MICROSCOPIC - Abnormal; Notable for the following components:   APPearance HAZY (*)    Hgb urine dipstick SMALL (*)    Protein, ur >=300 (*)    Nitrite POSITIVE (*)    Leukocytes, UA LARGE (*)  WBC, UA >50 (*)    Bacteria, UA FEW (*)    All other components within normal limits  CULTURE, BLOOD (ROUTINE X 2)  CULTURE, BLOOD (ROUTINE X 2)  URINE CULTURE  INFLUENZA PANEL BY PCR (TYPE A & B)  I-STAT CG4 LACTIC ACID, ED    EKG None  Radiology Ct Renal Stone Study  Result Date: 11/06/2018 CLINICAL DATA:  83 year old male with history of abdominal pain, fever and bladder spasms. Recurrent urinary tract infections. History of bladder cancer. EXAM: CT ABDOMEN AND PELVIS WITHOUT CONTRAST TECHNIQUE: Multidetector CT imaging of the abdomen and pelvis was performed following the standard protocol without IV contrast.  COMPARISON:  CT the abdomen and pelvis 10/24/2018. FINDINGS: Lower chest: Atherosclerotic calcifications in the right coronary artery. Small right and trace left pleural effusions lying dependently. Hepatobiliary: No definite suspicious cystic or solid hepatic lesions are confidently identified on today's noncontrast CT examination. Unenhanced appearance of the gallbladder is normal. Pancreas: No definite pancreatic mass or peripancreatic fluid or inflammatory changes noted on today's noncontrast CT examination. Spleen: Unremarkable. Adrenals/Urinary Tract: Bilateral nephrostomy tubes in position, with extension catheters extending into the urinary bladder. Suprapubic catheter extending into the urinary bladder. Left extrarenal pelvis. Bilateral adrenal glands are normal in appearance. Other than the indwelling catheters, urinary bladder is otherwise normal in appearance. Stomach/Bowel: Unenhanced appearance of the stomach is normal. No pathologic dilatation of small bowel or colon. Numerous colonic diverticulae are noted, without surrounding inflammatory changes to suggest an acute diverticulitis at this time. Left upper quadrant colostomy. Normal appendix. Vascular/Lymphatic: Aortic atherosclerosis. No lymphadenopathy noted in the abdomen or pelvis. Reproductive: Prostate gland and seminal vesicles are unremarkable in appearance. Other: Trace volume of ascites.  No pneumoperitoneum. Musculoskeletal: Status post PLIF at L3-L4. There are no aggressive appearing lytic or blastic lesions noted in the visualized portions of the skeleton. IMPRESSION: 1. No acute findings are noted in the abdomen or pelvis to account for the patient's symptoms. 2. Colonic diverticulosis without evidence of acute diverticulitis at this time. 3. Small right and trace left pleural effusions lying dependently. 4. Aortic atherosclerosis, in addition to at least right coronary artery disease. 5. Additional incidental findings, similar prior  study, as above. Electronically Signed   By: Vinnie Langton M.D.   On: 11/06/2018 18:39    Procedures Procedures (including critical care time)  Medications Ordered in ED Medications  sodium chloride flush (NS) 0.9 % injection 10-40 mL (has no administration in time range)  acetaminophen (TYLENOL) tablet 650 mg (has no administration in time range)  sodium chloride 0.9 % bolus 500 mL (0 mLs Intravenous Stopped 11/06/18 1904)  alteplase (CATHFLO ACTIVASE) injection 2 mg (2 mg Intracatheter Given 11/06/18 2012)  sterile water (preservative free) injection (  Given 11/06/18 2012)  sodium chloride 0.9 % bolus 1,000 mL (1,000 mLs Intravenous New Bag/Given 11/06/18 2239)  cefTRIAXone (ROCEPHIN) injection 1 g (1 g Intramuscular Given 11/06/18 2223)  lidocaine (XYLOCAINE) 1 % (with pres) injection (2.1 mLs  Given 11/06/18 2223)  oxyCODONE-acetaminophen (PERCOCET/ROXICET) 5-325 MG per tablet 1 tablet (1 tablet Oral Given 11/06/18 2237)     Initial Impression / Assessment and Plan / ED Course  I have reviewed the triage vital signs and the nursing notes.  Pertinent labs & imaging results that were available during my care of the patient were reviewed by me and considered in my medical decision making (see chart for details).     While the emergency department he was given IM Rocephin because of  issues with his port.  His port is now working.  He will receive 1 L IV fluids.  While in the emergency department he spiked a fever to 103.7.  Blood cultures obtained.  Lactate will be added.  Chest x-ray will be added.  Patient will be admitted to the stepdown unit for increasing delirium in the setting of fever and likely acute cystitis.  Chest x-ray without obvious pneumonia.  Consultant: Dr Hampton Abbot, Hospitalist  Admit  Final Clinical Impressions(s) / ED Diagnoses   Final diagnoses:  Fever, unspecified fever cause  Acute cystitis without hematuria    ED Discharge Orders    None         Jola Schmidt, MD 11/06/18 2316

## 2018-11-07 ENCOUNTER — Other Ambulatory Visit: Payer: Self-pay

## 2018-11-07 LAB — BLOOD CULTURE ID PANEL (REFLEXED)
ACINETOBACTER BAUMANNII: NOT DETECTED
CANDIDA KRUSEI: NOT DETECTED
CANDIDA TROPICALIS: NOT DETECTED
Candida albicans: NOT DETECTED
Candida glabrata: NOT DETECTED
Candida parapsilosis: NOT DETECTED
Carbapenem resistance: NOT DETECTED
Enterobacter cloacae complex: NOT DETECTED
Enterobacteriaceae species: DETECTED — AB
Enterococcus species: NOT DETECTED
Escherichia coli: NOT DETECTED
Haemophilus influenzae: NOT DETECTED
KLEBSIELLA OXYTOCA: NOT DETECTED
Klebsiella pneumoniae: NOT DETECTED
Listeria monocytogenes: NOT DETECTED
Neisseria meningitidis: NOT DETECTED
PROTEUS SPECIES: DETECTED — AB
Pseudomonas aeruginosa: NOT DETECTED
SERRATIA MARCESCENS: NOT DETECTED
STAPHYLOCOCCUS SPECIES: NOT DETECTED
STREPTOCOCCUS PNEUMONIAE: NOT DETECTED
Staphylococcus aureus (BCID): NOT DETECTED
Streptococcus agalactiae: NOT DETECTED
Streptococcus pyogenes: NOT DETECTED
Streptococcus species: NOT DETECTED

## 2018-11-07 LAB — BASIC METABOLIC PANEL
Anion gap: 9 (ref 5–15)
BUN: 33 mg/dL — AB (ref 8–23)
CALCIUM: 8.4 mg/dL — AB (ref 8.9–10.3)
CO2: 17 mmol/L — ABNORMAL LOW (ref 22–32)
Chloride: 107 mmol/L (ref 98–111)
Creatinine, Ser: 1.89 mg/dL — ABNORMAL HIGH (ref 0.61–1.24)
GFR calc Af Amer: 37 mL/min — ABNORMAL LOW (ref >60)
GFR, EST NON AFRICAN AMERICAN: 32 mL/min — AB (ref >60)
Glucose, Bld: 107 mg/dL — ABNORMAL HIGH (ref 70–99)
POTASSIUM: 4.7 mmol/L (ref 3.5–5.1)
SODIUM: 133 mmol/L — AB (ref 135–145)

## 2018-11-07 LAB — CBC WITH DIFFERENTIAL/PLATELET
ABS IMMATURE GRANULOCYTES: 0.36 10*3/uL — AB (ref 0.00–0.07)
BASOS PCT: 0 %
Basophils Absolute: 0 10*3/uL (ref 0.0–0.1)
Eosinophils Absolute: 0.1 10*3/uL (ref 0.0–0.5)
Eosinophils Relative: 1 %
HCT: 23.3 % — ABNORMAL LOW (ref 39.0–52.0)
Hemoglobin: 7.2 g/dL — ABNORMAL LOW (ref 13.0–17.0)
IMMATURE GRANULOCYTES: 4 %
Lymphocytes Relative: 8 %
Lymphs Abs: 0.7 10*3/uL (ref 0.7–4.0)
MCH: 31.6 pg (ref 26.0–34.0)
MCHC: 30.9 g/dL (ref 30.0–36.0)
MCV: 102.2 fL — ABNORMAL HIGH (ref 80.0–100.0)
MONO ABS: 0.7 10*3/uL (ref 0.1–1.0)
Monocytes Relative: 8 %
NEUTROS ABS: 7.5 10*3/uL (ref 1.7–7.7)
NEUTROS PCT: 79 %
PLATELETS: 61 10*3/uL — AB (ref 150–400)
RBC: 2.28 MIL/uL — AB (ref 4.22–5.81)
RDW: 19.5 % — AB (ref 11.5–15.5)
WBC: 9.3 10*3/uL (ref 4.0–10.5)
nRBC: 0 % (ref 0.0–0.2)

## 2018-11-07 LAB — LACTIC ACID, PLASMA: Lactic Acid, Venous: 1 mmol/L (ref 0.5–1.9)

## 2018-11-07 LAB — MRSA PCR SCREENING: MRSA by PCR: NEGATIVE

## 2018-11-07 LAB — INFLUENZA PANEL BY PCR (TYPE A & B)
Influenza A By PCR: NEGATIVE
Influenza B By PCR: NEGATIVE

## 2018-11-07 LAB — MAGNESIUM: MAGNESIUM: 1.8 mg/dL (ref 1.7–2.4)

## 2018-11-07 MED ORDER — ENOXAPARIN SODIUM 30 MG/0.3ML ~~LOC~~ SOLN: 30.0000 mg | SUBCUTANEOUS | Status: DC

## 2018-11-07 MED ORDER — OLANZAPINE 5 MG PO TBDP
5.0000 mg | ORAL_TABLET | Freq: Every day | ORAL | Status: DC
  Administered 2018-11-07 – 2018-11-09 (×3): 5 mg via ORAL
  Filled 2018-11-07 (×3): qty 1

## 2018-11-07 MED ORDER — PRAMIPEXOLE DIHYDROCHLORIDE 0.25 MG PO TABS
0.5000 mg | ORAL_TABLET | Freq: Two times a day (BID) | ORAL | Status: DC | PRN
  Administered 2018-11-07 – 2018-11-10 (×3): 0.5 mg via ORAL
  Filled 2018-11-07 (×4): qty 2

## 2018-11-07 MED ORDER — ACETAMINOPHEN 325 MG PO TABS
650.0000 mg | ORAL_TABLET | Freq: Once | ORAL | Status: AC
  Administered 2018-11-07: 650 mg via ORAL
  Filled 2018-11-07: qty 2

## 2018-11-07 MED ORDER — SODIUM CHLORIDE 0.9 % IV SOLN
INTRAVENOUS | Status: DC | PRN
  Administered 2018-11-07: 250 mL via INTRAVENOUS

## 2018-11-07 MED ORDER — ACETAMINOPHEN 325 MG PO TABS
650.0000 mg | ORAL_TABLET | Freq: Four times a day (QID) | ORAL | Status: DC | PRN
  Administered 2018-11-07: 650 mg via ORAL
  Filled 2018-11-07: qty 2

## 2018-11-07 MED ORDER — SODIUM CHLORIDE 0.9 % IV SOLN
2.0000 g | INTRAVENOUS | Status: DC
  Administered 2018-11-07 – 2018-11-08 (×2): 2 g via INTRAVENOUS
  Filled 2018-11-07: qty 20
  Filled 2018-11-07 (×2): qty 2

## 2018-11-07 MED ORDER — BOOST / RESOURCE BREEZE PO LIQD CUSTOM
1.0000 | Freq: Three times a day (TID) | ORAL | Status: DC
  Administered 2018-11-08 (×2): 1 via ORAL
  Filled 2018-11-07: qty 1

## 2018-11-07 MED ORDER — PRAMIPEXOLE DIHYDROCHLORIDE 1 MG PO TABS
1.5000 mg | ORAL_TABLET | Freq: Every day | ORAL | Status: DC
  Administered 2018-11-07 – 2018-11-09 (×3): 1.5 mg via ORAL
  Filled 2018-11-07 (×4): qty 2

## 2018-11-07 MED ORDER — SODIUM CHLORIDE 0.9 % IV SOLN
INTRAVENOUS | Status: DC
  Administered 2018-11-07 – 2018-11-08 (×4): via INTRAVENOUS

## 2018-11-07 NOTE — Plan of Care (Signed)
  Problem: Education: Goal: Knowledge of General Education information will improve Description: Including pain rating scale, medication(s)/side effects and non-pharmacologic comfort measures Outcome: Progressing   Problem: Health Behavior/Discharge Planning: Goal: Ability to manage health-related needs will improve Outcome: Progressing   Problem: Coping: Goal: Level of anxiety will decrease Outcome: Progressing   Problem: Elimination: Goal: Will not experience complications related to bowel motility Outcome: Progressing Goal: Will not experience complications related to urinary retention Outcome: Progressing   Problem: Pain Managment: Goal: General experience of comfort will improve Outcome: Progressing   Problem: Safety: Goal: Ability to remain free from injury will improve Outcome: Progressing   Problem: Skin Integrity: Goal: Risk for impaired skin integrity will decrease Outcome: Progressing   Problem: Urinary Elimination: Goal: Signs and symptoms of infection will decrease Outcome: Progressing   

## 2018-11-07 NOTE — ED Notes (Signed)
Report given to Michelle, RN

## 2018-11-07 NOTE — Progress Notes (Signed)
Referring Physician(s): [place]  Supervising Physician: Corrie Mckusick  Patient Status:  [facility] - In-pt  Chief Complaint:  Pelvic pain, confusion  Subjective: Patient familiar to IR service from recent nephroureteral catheter exchanges and suprapubic catheter placement.  He has a history of bladder cancer with bilateral hydronephrosis necessitating nephrostomy catheters and ultimately placement of a suprapubic catheter.  On 09/20/2018 he underwent right sided nephroureteral catheter exchange as well as conversion of left nephrostomy to left nephroureteral catheter.  Both nephroureteral catheters were subsequently capped and he was scheduled for outpatient exchange on 11/18.  He was admitted to [facility] on 11/10 with altered mentation, abd/pelvic discomfort and fever/chills.  Current WBC is normal, hemoglobin 7.2, platelets 61k, creat 1.89.  Blood cultures negative to date, urine cultures pending.  Previous urine culture from 08/19/2018 grew MRSA.  Request now received from primary team for nephroureteral and suprapubic catheter exchanges while in-house.  Past Medical History:  Diagnosis Date  . Aortic atherosclerosis ([place])   . BPH with urinary obstruction   . CAD (coronary artery disease)    a.  MI 1995, CABG x 3 2002 (patient says that he had LIMA and RIMA grafts). b. ETT-Cardiolite (10/15) with EF 48%, apical scar, no ischemia. c. Nuc 07/2017 abnormal -> cath was performed,  patent LIMA to LAD and SVG to OM 2. RIMA to RCA is atretic. The right coronary artery is occluded distally with extensive collaterals from the LAD, medical therapy.   . Cancer [facility])    bladder cancer  . Cervical spondylosis without myelopathy 15-Jan-202017  . Chronic low back pain    sees Dr. Kary Kos   . GERD (gastroesophageal reflux disease)   . Hiatal hernia   . Hyperlipidemia   . Hypertension   . IBS (irritable bowel syndrome)   . Ischemic cardiomyopathy   . Myocardial infarction ([place])   . RBBB     . Restless legs syndrome   . Statin intolerance   . Syncope    a. in 2015 - no apparent cause, was taking sleep medicine at the time. Cardiac workup unremarkable.  . Tubular adenoma of colon    Past Surgical History:  Procedure Laterality Date  . COLONOSCOPY  10/17/2014   per Dr. Hilarie Fredrickson, tubular adenomas, repeat in 3 yrs   . COLONOSCOPY WITH PROPOFOL N/A 12/01/2017   Procedure: COLONOSCOPY WITH PROPOFOL;  Surgeon: Milus Banister, MD;  Location: [facility];  Service: Endoscopy;  Laterality: N/A;  . CYSTOSCOPY W/ RETROGRADES Left 11/25/2017   Procedure: CYSTOSCOPY WITH RETROGRADE PYELOGRAM;  Surgeon: Irine Seal, MD;  Location: [facility]S;  Service: Urology;  Laterality: Left;  . HEART BYPASS    . IR CONVERT LEFT NEPHROSTOMY TO NEPHROURETERAL CATH  09/20/2018  . IR CONVERT RIGHT NEPHROSTOMY TO NEPHROURETERAL CATH  02/04/2018  . IR CONVERT RIGHT NEPHROSTOMY TO NEPHROURETERAL CATH  08/23/2018  . IR EXT NEPHROURETERAL CATH EXCHANGE  04/11/2018  . IR EXT NEPHROURETERAL CATH EXCHANGE  07/04/2018  . IR EXT NEPHROURETERAL CATH EXCHANGE  09/20/2018  . IR FLUORO GUIDE CV LINE RIGHT  12/27/2017  . IR FLUORO GUIDED NEEDLE PLC ASPIRATION/INJECTION LOC  08/19/2018  . IR NEPHROSTOGRAM LEFT THRU EXISTING ACCESS  12/24/2017  . IR NEPHROSTOMY EXCHANGE LEFT  01/28/2018  . IR NEPHROSTOMY EXCHANGE LEFT  02/04/2018  . IR NEPHROSTOMY EXCHANGE LEFT  02/25/2018  . IR NEPHROSTOMY EXCHANGE LEFT  07/04/2018  . IR NEPHROSTOMY EXCHANGE LEFT  07/05/2018  . IR NEPHROSTOMY EXCHANGE LEFT  08/09/2018  . IR  NEPHROSTOMY EXCHANGE LEFT  08/16/2018  . IR NEPHROSTOMY EXCHANGE LEFT  08/23/2018  . IR NEPHROSTOMY EXCHANGE RIGHT  12/24/2017  . IR NEPHROSTOMY EXCHANGE RIGHT  01/28/2018  . IR NEPHROSTOMY EXCHANGE RIGHT  04/11/2018  . IR NEPHROSTOMY EXCHANGE RIGHT  07/05/2018  . IR NEPHROSTOMY EXCHANGE RIGHT  08/09/2018  . IR NEPHROSTOMY EXCHANGE RIGHT  08/19/2018  . IR NEPHROSTOMY PLACEMENT LEFT  11/28/2017  . IR NEPHROSTOMY PLACEMENT RIGHT  12/03/2017   . IR PATIENT EVAL TECH 0-60 MINS  03/24/2018  . IR PATIENT EVAL TECH 0-60 MINS  03/28/2018  . IR PATIENT EVAL TECH 0-60 MINS  04/25/2018  . IR PATIENT EVAL TECH 0-60 MINS  06/20/2018  . IR US GUIDE VASC ACCESS RIGHT  12/27/2017  . LAPAROSCOPY N/A 12/01/2017   Procedure: LAPAROSCOPIC DIVERTING OSTOMY;  Surgeon: Stark Klein, MD;  Location: [facility];  Service: General;  Laterality: N/A;  . LEFT HEART CATH AND CORS/GRAFTS ANGIOGRAPHY N/A 08/25/2017   Procedure: LEFT HEART CATH AND CORS/GRAFTS ANGIOGRAPHY;  Surgeon: Wellington Hampshire, MD;  Location: [facility];  Service: Cardiovascular;  Laterality: N/A;  . LUMBAR FUSION  2003   L3-L4  . PROSTATE SURGERY  06-27-12   per Dr. Roni Bread, had CTT  . TONSILLECTOMY    . TRANSURETHRAL RESECTION OF BLADDER TUMOR N/A 11/25/2017   Procedure: TRANSURETHRAL RESECTION OF BLADDER TUMOR (TURBT);  Surgeon: Irine Seal, MD;  Location[facility];  Service: Urology;  Laterality: N/A;      Allergies: Statins and Cyproheptadine  Medications: Prior to Admission medications   Medication Sig Start Date End Date Taking? Authorizing Provider  carvedilol (COREG) 3.125 MG tablet Take 3.125 mg by mouth 2 (two) times daily with a meal.   Yes [provider]  DULoxetine (CYMBALTA) 20 MG capsule Take 1 capsule (20 mg total) by mouth 2 (two) times daily. Take once daily for 2 weeks then increase to twice daily Patient taking differently: Take 20 mg by mouth 2 (two) times daily.  08/12/18  Yes Tanner, Lyndon Code., PA-C  famotidine (PEPCID) 20 MG tablet Take 1 tablet (20 mg total) by mouth 2 (two) times daily. 10/18/18  Yes Wyatt Portela, MD  furosemide (LASIX) 40 MG tablet Take 1 tablet (40 mg total) by mouth 2 (two) times daily. Patient taking differently: Take 40 mg by mouth daily as needed for fluid or edema.  05/13/18  Yes End, Harrell Gave, MD  hydrOXYzine (ATARAX/VISTARIL) 25 MG tablet Take 1 tablet (25 mg total) by mouth every 4 (four) hours as needed for  itching. Patient taking differently: Take 25 mg by mouth 2 (two) times daily.  07/21/18  Yes [name], MD  lidocaine-prilocaine (EMLA) cream Apply 1 application topically as needed. 10/18/18  Yes Wyatt Portela, MD  nitroGLYCERIN (NITROSTAT) 0.4 MG SL tablet PLACE 1 TABLET (0.4 MG TOTAL) UNDER THE TONGUE EVERY 5 (FIVE) MINUTES AS NEEDED. 09/27/18 12/26/18 Yes Dunn, Dayna N, PA-C  OLANZapine (ZYPREXA) 5 MG tablet Take 0.5 tablets (2.5 mg total) by mouth at bedtime. 10/10/18  Yes Tanner, Lyndon Code., PA-C  oxyCODONE (OXYCONTIN) 10 mg 12 hr tablet Take 1 tablet (10 mg total) by mouth 2 (two) times daily. Patient taking differently: Take 10 mg by mouth at bedtime.  07/22/18  Yes Wyatt Portela, MD  oxyCODONE-acetaminophen (PERCOCET) 10-325 MG tablet Take 1 tablet by mouth every 4 (four) hours as needed for pain. 10/27/18  Yes Wyatt Portela, MD  pramipexole (MIRAPEX) 1 MG tablet TAKE 1 AND  1/2 TABLETS BY MOUTH AT BEDTIME Patient taking differently: Take 1.5 mg by mouth every evening.  06/29/18  Yes [name], MD  carvedilol (COREG) 3.125 MG tablet Take 1 tablet (3.125 mg total) by mouth 2 (two) times daily. 07/04/18 09/20/18  Donne Hazel, MD  feeding supplement (BOOST HIGH PROTEIN) LIQD Take 1 Container by mouth 2 (two) times daily between meals.    [provider]  megestrol (MEGACE) 40 MG/ML suspension TAKE 10ML BY MOUTH TWICE A DAY Patient not taking: Reported on 09/21/2018 05/13/18   Wyatt Portela, MD  polyvinyl alcohol (LIQUIFILM TEARS) 1.4 % ophthalmic solution Place 1 drop into both eyes 2 (two) times daily as needed (dry eyes). Patient not taking: Reported on 11/06/2018 08/24/18   Nita Sells, MD  ranitidine (ZANTAC) 300 MG tablet TAKE 1 TABLET BY MOUTH TWICE A DAY Patient not taking: Reported on 10/10/2018 09/07/18   Pyrtle, Lajuan Lines, MD  triamcinolone cream (KENALOG) 0.1 % Apply topically 3 (three) times daily. To affected areas 07/04/18   Donne Hazel, MD     Vital  Signs: BP 121/78 (BP Location: Right Arm)   Pulse (!) 102   Temp 100.2 F (37.9 C) (Oral)   Resp 18   Ht 5\' 8"  (1.727 m)   Wt 186 lb 11.7 oz (84.7 kg)   SpO2 98%   BMI 28.39 kg/m   Physical Exam patient drowsy but arousable.  Chest clear to auscultation bilaterally.  Clean, intact right chest wall Port-A-Cath.  Heart with regular rate and rhythm.  Abdomen soft, left colostomy intact; both nephroureteral catheter and suprapubic catheters intact.  Left nephrostomy with hazy yellow urine.  Right nephrostomy with clear yellow urine.  Both drains reconnected to gravity bags.  Dressings dry.  Mild pelvic tenderness to palpation; lower extremity edema  Imaging: Dg Chest Portable 1 View  Result Date: 11/06/2018 CLINICAL DATA:  Altered mental status.  Fever. EXAM: PORTABLE CHEST 1 VIEW COMPARISON:  October 10, 2018 FINDINGS: The right Port-A-Cath is stable. Cardiomegaly is similar in the me interval. The hila and mediastinum are unchanged. No pneumothorax. No other acute abnormalities. IMPRESSION: Stable Port-A-Cath.  No cause for fever noted. Electronically Signed   By: Dorise Bullion III M.D   On: 11/06/2018 23:12   Ct Renal Stone Study  Result Date: 11/06/2018 CLINICAL DATA:  83 year old male with history of abdominal pain, fever and bladder spasms. Recurrent urinary tract infections. History of bladder cancer. EXAM: CT ABDOMEN AND PELVIS WITHOUT CONTRAST TECHNIQUE: Multidetector CT imaging of the abdomen and pelvis was performed following the standard protocol without IV contrast. COMPARISON:  CT the abdomen and pelvis 10/24/2018. FINDINGS: Lower chest: Atherosclerotic calcifications in the right coronary artery. Small right and trace left pleural effusions lying dependently. Hepatobiliary: No definite suspicious cystic or solid hepatic lesions are confidently identified on today's noncontrast CT examination. Unenhanced appearance of the gallbladder is normal. Pancreas: No definite pancreatic mass  or peripancreatic fluid or inflammatory changes noted on today's noncontrast CT examination. Spleen: Unremarkable. Adrenals/Urinary Tract: Bilateral nephrostomy tubes in position, with extension catheters extending into the urinary bladder. Suprapubic catheter extending into the urinary bladder. Left extrarenal pelvis. Bilateral adrenal glands are normal in appearance. Other than the indwelling catheters, urinary bladder is otherwise normal in appearance. Stomach/Bowel: Unenhanced appearance of the stomach is normal. No pathologic dilatation of small bowel or colon. Numerous colonic diverticulae are noted, without surrounding inflammatory changes to suggest an acute diverticulitis at this time. Left upper quadrant colostomy. Normal  appendix. Vascular/Lymphatic: Aortic atherosclerosis. No lymphadenopathy noted in the abdomen or pelvis. Reproductive: Prostate gland and seminal vesicles are unremarkable in appearance. Other: Trace volume of ascites.  No pneumoperitoneum. Musculoskeletal: Status post PLIF at L3-L4. There are no aggressive appearing lytic or blastic lesions noted in the visualized portions of the skeleton. IMPRESSION: 1. No acute findings are noted in the abdomen or pelvis to account for the patient's symptoms. 2. Colonic diverticulosis without evidence of acute diverticulitis at this time. 3. Small right and trace left pleural effusions lying dependently. 4. Aortic atherosclerosis, in addition to at least right coronary artery disease. 5. Additional incidental findings, similar prior study, as above. Electronically Signed   By: Vinnie Langton M.D.   On: 11/06/2018 18:39    Labs:  CBC: Recent Labs    09/23/18 0913 10/10/18 1108 11/06/18 1958 11/07/18 0123  WBC 10.9* 5.6 10.0 9.3  HGB 7.5* 7.0* 7.9* 7.2*  HCT 21.9* 21.7* 25.1* 23.3*  PLT 60* 45* 66* 61*    COAGS: Recent Labs    12/27/17 1227 07/04/18 0346 08/19/18 0813 08/22/18 1730 09/20/18 1211  INR 1.13 1.22 1.23 1.15 1.05   APTT 33  --  36 37* 34    BMP: Recent Labs    09/23/18 0913 10/10/18 1113 11/06/18 1958 11/07/18 0123  NA 130* 137 133* 133*  K 4.4 4.2 4.6 4.7  CL 101 104 104 107  CO2 19* 23 20* 17*  GLUCOSE 128* 103* 93 107*  BUN 24* 30* 31* 33*  CALCIUM 9.3 9.3 9.0 8.4*  CREATININE 1.77* 1.67* 1.86* 1.89*  GFRNONAA 34* 37* 32* 32*  GFRAA 40* 43* 37* 37*    LIVER FUNCTION TESTS: Recent Labs    08/12/18 1239 09/23/18 0913 10/10/18 1113 11/06/18 1958  BILITOT 0.4 0.8 0.9 1.3*  AST 14* 17 16 17   ALT 12 19 13 13   ALKPHOS 61 61 57 58  PROT 7.2 6.9 7.2 6.9  ALBUMIN 3.8 3.2* 3.4* 3.4*    Assessment and Plan:  Pt with history of bladder cancer with bilateral hydronephrosis necessitating nephrostomy catheters and ultimately placement of a suprapubic catheter.  On 09/20/2018 he underwent right sided nephroureteral catheter exchange as well as conversion of left nephrostomy to left nephroureteral catheter.  Both nephroureteral catheters were subsequently capped and he was scheduled for outpatient exchange on 11/18.  He was admitted to [facility] on 11/10 with altered mentation, abd/pelvic discomfort and fever/chills.  Current WBC is normal, hemoglobin 7.2, platelets 61k, creat 1.89.  Blood cultures negative to date, urine cultures pending.  Previous urine culture from 08/19/2018 grew MRSA.  Request now received from primary team for nephroureteral and suprapubic catheter exchanges while in-house.  Patient currently on IV vancomycin and Maxipime.  Tentative plan is for catheter exchanges on 11/12 schedule permitting .  Above plans discussed with patient and wife with their understanding and consent.   Electronically Signed: D. Rowe Robert, PA-C 11/07/2018, 4:36 PM   I spent a total of 25 minutes at the the patient's bedside AND on the patient's hospital floor or unit, greater than 50% of which was counseling/coordinating care for billateral nephroureteral catheter and suprapubic  catheter exchanges    Patient ID: Charles Coffey, male   DOB: 02/02/1935, 83 y.o.   MRN: [id number]

## 2018-11-07 NOTE — Progress Notes (Signed)
PROGRESS NOTE    Charles Coffey  PJK:[id number] DOB: [birth year] DOA: 11/06/2018 PCP: [name], MD      Brief Narrative:  Charles Coffey is a 83 y.o. M with CAD s/p CABG 2002, CHF EF 25%, colon cancer s/p obstruction and colostomy, bladder cancer with nephrostomy tubes and SP catheter who presents with fever, abdominal pain.     Assessment & Plan:  Sepsis from UTI Discussed with Urology this morning.  Will need IR to exchange tubes, also exchange SP catheter, then medical mgmt alone. -Continue antibiotics -Continue to follow blood and urine cultures -IR consult for exchange neph tubes and SP cath   Dehydration No AKI, baseline Cr 1.5-1.7.  Slightly worse now.   -IV fluids  Acute on chronic macrocytic anemia  Chronic systolic CHF -Hold carvedilol and Lasix  Delirium Acute metabolic encecphalopathy from infection. -Continue Zyprexa -Hold duloxetine for now  Hyponatremia Stable, no change  Other medciations -Continue Mirapax  Thrombocytopenia Stable from previous -Daily CBC -Hold heparins    DVT prophylaxis: SCDs Code Status: DO NOT RESUSCIATATE Family Communication: Wife at bedisde MDM and disposition Plan: This is a no charge note.  For further details, please see H&P by my partner Dr. [place] Abbot from earlier today.  The below labs and imaging reports were reviewed and summarized above.    The patient was admitted with sepsis from UTI.    Objective: Vitals:   11/07/18 0601 11/07/18 0730 11/07/18 1500 11/07/18 1608  BP: 110/72   121/78  Pulse: (!) 114  (!) 102 (!) 102  Resp: 18   18  Temp: (!) 102.9 F (39.4 C) 98.9 F (37.2 C)  100.2 F (37.9 C)  TempSrc: Oral Oral  Oral  SpO2: 96%   98%  Weight:      Height:        Intake/Output Summary (Last 24 hours) at 11/07/2018 1730 Last data filed at 11/07/2018 1619 Gross per 24 hour  Intake 2100.82 ml  Output 1350 ml  Net 750.82 ml   Filed Weights   11/06/18 1653 11/07/18 0021  Weight: 81.6 kg  84.7 kg    Examination: The patient was seen and examined.      Data Reviewed: I have personally reviewed following labs and imaging studies:  CBC: Recent Labs  Lab 11/06/18 1958 11/07/18 0123  WBC 10.0 9.3  NEUTROABS  --  7.5  HGB 7.9* 7.2*  HCT 25.1* 23.3*  MCV 101.2* 102.2*  PLT 66* 61*   Basic Metabolic Panel: Recent Labs  Lab 11/06/18 1958 11/07/18 0123  NA 133* 133*  K 4.6 4.7  CL 104 107  CO2 20* 17*  GLUCOSE 93 107*  BUN 31* 33*  CREATININE 1.86* 1.89*  CALCIUM 9.0 8.4*  MG  --  1.8   GFR: Estimated Creatinine Clearance: 32.5 mL/min (A) (by C-G formula based on SCr of 1.89 mg/dL (H)). Liver Function Tests: Recent Labs  Lab 11/06/18 1958  AST 17  ALT 13  ALKPHOS 58  BILITOT 1.3*  PROT 6.9  ALBUMIN 3.4*   No results for input(s): LIPASE, AMYLASE in the last 168 hours. No results for input(s): AMMONIA in the last 168 hours. Coagulation Profile: No results for input(s): INR, PROTIME in the last 168 hours. Cardiac Enzymes: No results for input(s): CKTOTAL, CKMB, CKMBINDEX, TROPONINI in the last 168 hours. BNP (last 3 results) No results for input(s): PROBNP in the last 8760 hours. HbA1C: No results for input(s): HGBA1C in the last 72 hours. CBG: No  results for input(s): GLUCAP in the last 168 hours. Lipid Profile: No results for input(s): CHOL, HDL, LDLCALC, TRIG, CHOLHDL, LDLDIRECT in the last 72 hours. Thyroid Function Tests: No results for input(s): TSH, T4TOTAL, FREET4, T3FREE, THYROIDAB in the last 72 hours. Anemia Panel: No results for input(s): VITAMINB12, FOLATE, FERRITIN, TIBC, IRON, RETICCTPCT in the last 72 hours. Urine analysis:    Component Value Date/Time   COLORURINE YELLOW 11/06/2018 2041   APPEARANCEUR HAZY (A) 11/06/2018 2041   LABSPEC 1.011 11/06/2018 2041   PHURINE 8.0 11/06/2018 2041   GLUCOSEU NEGATIVE 11/06/2018 2041   HGBUR SMALL (A) 11/06/2018 2041   HGBUR negative 10/15/2010 1251   BILIRUBINUR NEGATIVE  11/06/2018 2041   BILIRUBINUR n 12/01/2016 [place] 11/06/2018 2041   PROTEINUR >=300 (A) 11/06/2018 2041   UROBILINOGEN 0.2 12/01/2016 1521   UROBILINOGEN 0.2 10/15/2010 1251   NITRITE POSITIVE (A) 11/06/2018 2041   LEUKOCYTESUR LARGE (A) 11/06/2018 2041   Sepsis Labs: @LABRCNTIP (procalcitonin:4,lacticacidven:4)  ) Recent Results (from the past 240 hour(s))  Blood culture (routine x 2)     Status: None (Preliminary result)   Collection Time: 11/06/18  5:10 PM  Result Value Ref Range Status   Specimen Description   Final    BLOOD RIGHT ANTECUBITAL Performed at [facility], [place] [address], [place], [place] [address]    Special Requests   Final    BOTTLES DRAWN AEROBIC AND ANAEROBIC Blood Culture results may not be optimal due to an excessive volume of blood received in culture bottles Performed at [place] [address] [place], [place] [address]    Culture  Setup Time ANAEROBIC BOTTLE ONLY Organism ID to follow   Final   Culture   Final    NO GROWTH < 12 HOURS Performed at [facility], [place] [address] [place], [place] [address]    Report Status PENDING  Incomplete  MRSA PCR Screening     Status: None   Collection Time: 11/07/18 12:32 AM  Result Value Ref Range Status   MRSA by PCR NEGATIVE NEGATIVE Final    Comment:        The GeneXpert MRSA Assay (FDA approved for NASAL specimens only), is one component of a comprehensive MRSA colonization surveillance program. It is not intended to diagnose MRSA infection nor to guide or monitor treatment for MRSA infections. Performed at [facility], [place] [address] [place], [place] [address]   Blood culture (routine x 2)     Status: None (Preliminary result)   Collection Time: 11/07/18  1:23 AM  Result Value Ref Range Status   Specimen Description   Final    BLOOD LEFT ANTECUBITAL Performed at [place]  [address], [place], [place] [address]    Special Requests   Final    BOTTLES DRAWN AEROBIC ONLY Blood Culture adequate volume Performed at [place] [address] [place], [place] [address]    Culture   Final    NO GROWTH < 12 HOURS Performed at [facility] [address], [place], [place] [address]    Report Status PENDING  Incomplete         Radiology Studies: Dg Chest Portable 1 View  Result Date: 11/06/2018 CLINICAL DATA:  Altered mental status.  Fever. EXAM: PORTABLE CHEST 1 VIEW COMPARISON:  October 10, 2018 FINDINGS: The right Port-A-Cath is stable. Cardiomegaly is similar in the me interval. The hila and  mediastinum are unchanged. No pneumothorax. No other acute abnormalities. IMPRESSION: Stable Port-A-Cath.  No cause for fever noted. Electronically Signed   By: Dorise Bullion III M.D   On: 11/06/2018 23:12   Ct Renal Stone Study  Result Date: 11/06/2018 CLINICAL DATA:  83 year old male with history of abdominal pain, fever and bladder spasms. Recurrent urinary tract infections. History of bladder cancer. EXAM: CT ABDOMEN AND PELVIS WITHOUT CONTRAST TECHNIQUE: Multidetector CT imaging of the abdomen and pelvis was performed following the standard protocol without IV contrast. COMPARISON:  CT the abdomen and pelvis 10/24/2018. FINDINGS: Lower chest: Atherosclerotic calcifications in the right coronary artery. Small right and trace left pleural effusions lying dependently. Hepatobiliary: No definite suspicious cystic or solid hepatic lesions are confidently identified on today's noncontrast CT examination. Unenhanced appearance of the gallbladder is normal. Pancreas: No definite pancreatic mass or peripancreatic fluid or inflammatory changes noted on today's noncontrast CT examination. Spleen: Unremarkable. Adrenals/Urinary Tract: Bilateral nephrostomy tubes in position, with extension catheters extending into the urinary bladder. Suprapubic catheter  extending into the urinary bladder. Left extrarenal pelvis. Bilateral adrenal glands are normal in appearance. Other than the indwelling catheters, urinary bladder is otherwise normal in appearance. Stomach/Bowel: Unenhanced appearance of the stomach is normal. No pathologic dilatation of small bowel or colon. Numerous colonic diverticulae are noted, without surrounding inflammatory changes to suggest an acute diverticulitis at this time. Left upper quadrant colostomy. Normal appendix. Vascular/Lymphatic: Aortic atherosclerosis. No lymphadenopathy noted in the abdomen or pelvis. Reproductive: Prostate gland and seminal vesicles are unremarkable in appearance. Other: Trace volume of ascites.  No pneumoperitoneum. Musculoskeletal: Status post PLIF at L3-L4. There are no aggressive appearing lytic or blastic lesions noted in the visualized portions of the skeleton. IMPRESSION: 1. No acute findings are noted in the abdomen or pelvis to account for the patient's symptoms. 2. Colonic diverticulosis without evidence of acute diverticulitis at this time. 3. Small right and trace left pleural effusions lying dependently. 4. Aortic atherosclerosis, in addition to at least right coronary artery disease. 5. Additional incidental findings, similar prior study, as above. Electronically Signed   By: Vinnie Langton M.D.   On: 11/06/2018 18:39        Scheduled Meds: . [START ON 11/09/2018] enoxaparin (LOVENOX) injection  30 mg Subcutaneous Q24H  . OLANZapine zydis  5 mg Oral QHS  . pramipexole  1.5 mg Oral q1800   Continuous Infusions: . sodium chloride 250 mL (11/07/18 0033)  . sodium chloride 100 mL/hr at 11/07/18 1452  . ceFEPime (MAXIPIME) IV Stopped (11/07/18 0014)  . [START ON 11/08/2018] vancomycin       LOS: 1 day    Time spent: [place], MD [facility] 11/07/2018, 5:30 PM     Pager [phone] --- please page though AMION:  [email or web address] Password  [email or web address] If 7PM-7AM, please contact night-coverage

## 2018-11-07 NOTE — Progress Notes (Addendum)
PHARMACY - PHYSICIAN COMMUNICATION CRITICAL VALUE ALERT - BLOOD CULTURE IDENTIFICATION (BCID)  Charles Coffey is an 83 y.o. male who presented to [facility] on 11/06/2018 with a chief complaint of altered mentation and fever/chills.    Assessment: sepsis from UTI  Name of physician (or Provider) Contacted: Dr. Loleta Books   Current antibiotics: Vancomycin and Cefepime   Changes to prescribed antibiotics recommended:  Change to Ceftriaxone 2g IV q24h  Recommendations accepted by provider  Results for orders placed or performed during the hospital encounter of 11/06/18  Blood Culture ID Panel (Reflexed) (Collected: 11/06/2018  5:10 PM)  Result Value Ref Range   Enterococcus species NOT DETECTED NOT DETECTED   Listeria monocytogenes NOT DETECTED NOT DETECTED   Staphylococcus species NOT DETECTED NOT DETECTED   Staphylococcus aureus (BCID) NOT DETECTED NOT DETECTED   Streptococcus species NOT DETECTED NOT DETECTED   Streptococcus agalactiae NOT DETECTED NOT DETECTED   Streptococcus pneumoniae NOT DETECTED NOT DETECTED   Streptococcus pyogenes NOT DETECTED NOT DETECTED   Acinetobacter baumannii NOT DETECTED NOT DETECTED   Enterobacteriaceae species DETECTED (A) NOT DETECTED   Enterobacter cloacae complex NOT DETECTED NOT DETECTED   Escherichia coli NOT DETECTED NOT DETECTED   Klebsiella oxytoca NOT DETECTED NOT DETECTED   Klebsiella pneumoniae NOT DETECTED NOT DETECTED   Proteus species DETECTED (A) NOT DETECTED   Serratia marcescens NOT DETECTED NOT DETECTED   Carbapenem resistance NOT DETECTED NOT DETECTED   Haemophilus influenzae NOT DETECTED NOT DETECTED   Neisseria meningitidis NOT DETECTED NOT DETECTED   Pseudomonas aeruginosa NOT DETECTED NOT DETECTED   Candida albicans NOT DETECTED NOT DETECTED   Candida glabrata NOT DETECTED NOT DETECTED   Candida krusei NOT DETECTED NOT DETECTED   Candida parapsilosis NOT DETECTED NOT DETECTED   Candida tropicalis NOT DETECTED NOT  DETECTED    [name] 11/07/2018  6:55 PM

## 2018-11-07 NOTE — H&P (Signed)
History and Physical  Charles Coffey NLG:[id number] DOB: 1935/05/04 DOA: 11/06/2018 1654  Referring physician:  [facility] ED)   PCP: [name], MD  Outpatient Specialists: [facility]  HISTORY   Chief Complaint: altered mentation and fevers/chills  HPI: Charles Coffey is a 83 y.o. male with with CAD s/p CABG and HFrEF (EF25-30%), hiatal hernia, hx of colon cancer, hx of colonic obstruction s/p colostomy and bladder cancer(previouslyon [facility])  s/p b/l percutaneous nephrostomies and recent conversion to b/l nephroureteral catheters, suprapubic catheter, hx of urosepsis who presents with altered mentation, increasing lower abdominal pain and fevers/chills. Per wife, patient has been having clear adequate urinary output via the suprapubic catheter, although he does have some leakage through his urethra intermittently. No hematuria noted. Today patient's home temp was 101 deg; having severe abdominal pains. Normal stool color and output via colostomy. Wife reports that she had given him leftover abx (PO ciprofloxacin) for the past ~ 2 days because she was concerned for UTI, which has presented similarly in the past with delirium and fevers. Of note, IR had scheduled nephroureteral cath exchange on 11/18.   His prior hospitalization for urosepsis in August 0814 was complicated by severe delirium -- was started on zyprexa which his outpatient PCP has been tapering down (currently on 2.5mg  nightly).   Review of Systems: +fevers/chills, lower abdominal pain;  - no cough - no chest pain, dyspnea on exertion - no edema, PND, orthopnea - no nausea/vomiting; no tarry, melanotic or bloody stools - no weight changes  Rest of systems reviewed are negative, except as per above history.   ED course: Febrile to 103 deg  Vitals Blood pressure 131/71, pulse (!) 105, temperature (!) 103.2 F (39.6 C), temperature source Rectal, resp. rate 18, height 5\' 8"  (1.727 m), weight 81.6 kg, SpO2  90 %. Received alteplase (for clotted port); tylenol 650mg  x1;dilaudid 1mg  IV; ceftriaxone 1gm IM (IM due to port access issues);   Past Medical History:  Diagnosis Date  . Aortic atherosclerosis ([place])   . BPH with urinary obstruction   . CAD (coronary artery disease)    a.  MI 1995, CABG x 3 2002 (patient says that he had LIMA and RIMA grafts). b. ETT-Cardiolite (10/15) with EF 48%, apical scar, no ischemia. c. Nuc 07/2017 abnormal -> cath was performed,  patent LIMA to LAD and SVG to OM 2. RIMA to RCA is atretic. The right coronary artery is occluded distally with extensive collaterals from the LAD, medical therapy.   . Cancer [facility])    bladder cancer  . Cervical spondylosis without myelopathy 03-18-202017  . Chronic low back pain    sees Dr. Kary Kos   . GERD (gastroesophageal reflux disease)   . Hiatal hernia   . Hyperlipidemia   . Hypertension   . IBS (irritable bowel syndrome)   . Ischemic cardiomyopathy   . Myocardial infarction ([place])   . RBBB   . Restless legs syndrome   . Statin intolerance   . Syncope    a. in 2015 - no apparent cause, was taking sleep medicine at the time. Cardiac workup unremarkable.  . Tubular adenoma of colon    Past Surgical History:  Procedure Laterality Date  . COLONOSCOPY  10/17/2014   per Dr. Hilarie Fredrickson, tubular adenomas, repeat in 3 yrs   . COLONOSCOPY WITH PROPOFOL N/A 12/01/2017   Procedure: COLONOSCOPY WITH PROPOFOL;  Surgeon: Milus Banister, MD;  Location: [facility];  Service: Endoscopy;  Laterality:  N/A;  . CYSTOSCOPY W/ RETROGRADES Left 11/25/2017   Procedure: CYSTOSCOPY WITH RETROGRADE PYELOGRAM;  Surgeon: Irine Seal, MD;  Location: [facility];  Service: Urology;  Laterality: Left;  . HEART BYPASS    . IR CONVERT LEFT NEPHROSTOMY TO NEPHROURETERAL CATH  09/20/2018  . IR CONVERT RIGHT NEPHROSTOMY TO NEPHROURETERAL CATH  02/04/2018  . IR CONVERT RIGHT NEPHROSTOMY TO NEPHROURETERAL CATH  08/23/2018  . IR EXT NEPHROURETERAL CATH EXCHANGE  04/11/2018   . IR EXT NEPHROURETERAL CATH EXCHANGE  07/04/2018  . IR EXT NEPHROURETERAL CATH EXCHANGE  09/20/2018  . IR FLUORO GUIDE CV LINE RIGHT  12/27/2017  . IR FLUORO GUIDED NEEDLE PLC ASPIRATION/INJECTION LOC  08/19/2018  . IR NEPHROSTOGRAM LEFT THRU EXISTING ACCESS  12/24/2017  . IR NEPHROSTOMY EXCHANGE LEFT  01/28/2018  . IR NEPHROSTOMY EXCHANGE LEFT  02/04/2018  . IR NEPHROSTOMY EXCHANGE LEFT  02/25/2018  . IR NEPHROSTOMY EXCHANGE LEFT  07/04/2018  . IR NEPHROSTOMY EXCHANGE LEFT  07/05/2018  . IR NEPHROSTOMY EXCHANGE LEFT  08/09/2018  . IR NEPHROSTOMY EXCHANGE LEFT  08/16/2018  . IR NEPHROSTOMY EXCHANGE LEFT  08/23/2018  . IR NEPHROSTOMY EXCHANGE RIGHT  12/24/2017  . IR NEPHROSTOMY EXCHANGE RIGHT  01/28/2018  . IR NEPHROSTOMY EXCHANGE RIGHT  04/11/2018  . IR NEPHROSTOMY EXCHANGE RIGHT  07/05/2018  . IR NEPHROSTOMY EXCHANGE RIGHT  08/09/2018  . IR NEPHROSTOMY EXCHANGE RIGHT  08/19/2018  . IR NEPHROSTOMY PLACEMENT LEFT  11/28/2017  . IR NEPHROSTOMY PLACEMENT RIGHT  12/03/2017  . IR PATIENT EVAL TECH 0-60 MINS  03/24/2018  . IR PATIENT EVAL TECH 0-60 MINS  03/28/2018  . IR PATIENT EVAL TECH 0-60 MINS  04/25/2018  . IR PATIENT EVAL TECH 0-60 MINS  06/20/2018  . IR US GUIDE VASC ACCESS RIGHT  12/27/2017  . LAPAROSCOPY N/A 12/01/2017   Procedure: LAPAROSCOPIC DIVERTING OSTOMY;  Surgeon: Stark Klein, MD;  Location: [facility];  Service: General;  Laterality: N/A;  . LEFT HEART CATH AND CORS/GRAFTS ANGIOGRAPHY N/A 08/25/2017   Procedure: LEFT HEART CATH AND CORS/GRAFTS ANGIOGRAPHY;  Surgeon: Wellington Hampshire, MD;  Location: [facility];  Service: Cardiovascular;  Laterality: N/A;  . LUMBAR FUSION  2003   L3-L4  . PROSTATE SURGERY  06-27-12   per Dr. Roni Bread, had CTT  . TONSILLECTOMY    . TRANSURETHRAL RESECTION OF BLADDER TUMOR N/A 11/25/2017   Procedure: TRANSURETHRAL RESECTION OF BLADDER TUMOR (TURBT);  Surgeon: Irine Seal, MD;  Location: [facility];  Service: Urology;  Laterality: N/A;    Social History:  reports that  he quit smoking about 39 years ago. His smoking use included cigarettes. He has never used smokeless tobacco. He reports that he drank about 2.0 standard drinks of alcohol per week. He reports that he does not use drugs.  Allergies  Allergen Reactions  . Statins Other (See Comments)    liver effects  . Cyproheptadine Other (See Comments)    Unable to urinate    Family History  Problem Relation Age of Onset  . Heart attack Mother   . Aneurysm Father        femoral artery  . Heart disease Brother   . Heart disease Maternal Uncle        x 2  . Colon cancer Neg Hx   . Esophageal cancer Neg Hx   . Pancreatic cancer Neg Hx   . Kidney disease Neg Hx   . Liver disease Neg Hx       Prior to Admission medications  Medication Sig Start Date End Date Taking? Authorizing Provider  carvedilol (COREG) 3.125 MG tablet Take 3.125 mg by mouth 2 (two) times daily with a meal.   Yes [provider]  DULoxetine (CYMBALTA) 20 MG capsule Take 1 capsule (20 mg total) by mouth 2 (two) times daily. Take once daily for 2 weeks then increase to twice daily Patient taking differently: Take 20 mg by mouth 2 (two) times daily.  08/12/18  Yes Tanner, Lyndon Code., PA-C  famotidine (PEPCID) 20 MG tablet Take 1 tablet (20 mg total) by mouth 2 (two) times daily. 10/18/18  Yes Wyatt Portela, MD  famotidine (PEPCID) 20 MG tablet Take 20 mg by mouth 2 (two) times daily.   Yes [provider]  furosemide (LASIX) 40 MG tablet Take 1 tablet (40 mg total) by mouth 2 (two) times daily. Patient taking differently: Take 40 mg by mouth daily as needed for fluid or edema.  05/13/18  Yes End, Harrell Gave, MD  hydrOXYzine (ATARAX/VISTARIL) 25 MG tablet Take 1 tablet (25 mg total) by mouth every 4 (four) hours as needed for itching. Patient taking differently: Take 25 mg by mouth 2 (two) times daily.  07/21/18  Yes [name], MD  lidocaine-prilocaine (EMLA) cream Apply 1 application topically as needed. 10/18/18   Yes Wyatt Portela, MD  nitroGLYCERIN (NITROSTAT) 0.4 MG SL tablet PLACE 1 TABLET (0.4 MG TOTAL) UNDER THE TONGUE EVERY 5 (FIVE) MINUTES AS NEEDED. 09/27/18 12/26/18 Yes Dunn, Dayna N, PA-C  OLANZapine (ZYPREXA) 5 MG tablet Take 0.5 tablets (2.5 mg total) by mouth at bedtime. 10/10/18  Yes Tanner, Lyndon Code., PA-C  oxyCODONE (OXYCONTIN) 10 mg 12 hr tablet Take 1 tablet (10 mg total) by mouth 2 (two) times daily. Patient taking differently: Take 10 mg by mouth at bedtime.  07/22/18  Yes Wyatt Portela, MD  oxyCODONE-acetaminophen (PERCOCET) 10-325 MG tablet Take 1 tablet by mouth every 4 (four) hours as needed for pain. 10/27/18  Yes Wyatt Portela, MD  pramipexole (MIRAPEX) 1 MG tablet TAKE 1 AND 1/2 TABLETS BY MOUTH AT BEDTIME Patient taking differently: Take 1.5 mg by mouth every evening.  06/29/18  Yes [name], MD  carvedilol (COREG) 3.125 MG tablet Take 1 tablet (3.125 mg total) by mouth 2 (two) times daily. 07/04/18 09/20/18  Donne Hazel, MD  feeding supplement (BOOST HIGH PROTEIN) LIQD Take 1 Container by mouth 2 (two) times daily between meals.    [provider]  megestrol (MEGACE) 40 MG/ML suspension TAKE 10ML BY MOUTH TWICE A DAY Patient not taking: Reported on 09/21/2018 05/13/18   Wyatt Portela, MD  polyvinyl alcohol (LIQUIFILM TEARS) 1.4 % ophthalmic solution Place 1 drop into both eyes 2 (two) times daily as needed (dry eyes). Patient not taking: Reported on 11/06/2018 08/24/18   Nita Sells, MD  ranitidine (ZANTAC) 300 MG tablet TAKE 1 TABLET BY MOUTH TWICE A DAY Patient not taking: Reported on 10/10/2018 09/07/18   Pyrtle, Lajuan Lines, MD  triamcinolone cream (KENALOG) 0.1 % Apply topically 3 (three) times daily. To affected areas 07/04/18   Donne Hazel, MD    PHYSICAL EXAM   Temp:  [98.9 F (37.2 C)-103.2 F (39.6 C)] 103.2 F (39.6 C) (11/10 2246) Pulse Rate:  [49-110] 105 (11/10 2349) Resp:  [16-22] 18 (11/10 2349) BP: (106-134)/(71-86) 131/71 (11/10  2349) SpO2:  [76 %-100 %] 90 % (11/10 2349) Weight:  [81.6 kg] 81.6 kg (11/10 1653)  BP 131/71   Pulse Marland Kitchen)  105   Temp (!) 103.2 F (39.6 C) (Rectal)   Resp 18   Ht 5\' 8"  (1.727 m)   Wt 81.6 kg   SpO2 90%   BMI 27.37 kg/m    GEN well-nourished elderly caucasian male; uncomfortable and diaphoretic, delirious, not answering questions appropriately  HEENT NCAT EOM intact PERRL; clear oropharynx, no cervical LAD; moist mucus membranes  JVP estimated 5 cm H2O above RA; no HJR ; no carotid bruits b/l ;  CV regular tachycardic; normal S1 and S2; no m/r/g or S3/S4; PMI +displaced near axillae; no parasternal heave; R upper chest port; well healed sternotomy    RESP CTA b/l; breathing unlabored and symmetric  ABD soft nondistended +normoactive BS; soft brown stool in colostomy bag; ostomy site appears pink;  Suprapubic catheter in place; draining clear urine; + mod tenderness to palpation over bladder/lower abdom EXT warm throughout b/l; no peripheral edema b/l  PULSES  DP and radials 2+ intact b/l  SKIN/MSK no rashes or lesions NEURO/PSYCH AAOx1, delirium; no focal deficits   DATA   LABS ON ADMISSION:  Basic Metabolic Panel: Recent Labs  Lab 11/06/18 1958  NA 133*  K 4.6  CL 104  CO2 20*  GLUCOSE 93  BUN 31*  CREATININE 1.86*  CALCIUM 9.0   CBC: Recent Labs  Lab 11/06/18 1958  WBC 10.0  HGB 7.9*  HCT 25.1*  MCV 101.2*  PLT 66*   Liver Function Tests: Recent Labs  Lab 11/06/18 1958  AST 17  ALT 13  ALKPHOS 58  BILITOT 1.3*  PROT 6.9  ALBUMIN 3.4*   No results for input(s): LIPASE, AMYLASE in the last 168 hours. No results for input(s): AMMONIA in the last 168 hours. Coagulation:  Lab Results  Component Value Date   INR 1.05 09/20/2018   INR 1.15 08/22/2018   INR 1.23 08/19/2018   No results found for: PTT Lactic Acid, Venous: pending  Cardiac Enzymes: No results for input(s): CKTOTAL, CKMB, CKMBINDEX, TROPONINI in the last 168 hours. Urinalysis:     Component Value Date/Time   COLORURINE YELLOW 11/06/2018 2041   APPEARANCEUR HAZY (A) 11/06/2018 2041   LABSPEC 1.011 11/06/2018 2041   PHURINE 8.0 11/06/2018 2041   GLUCOSEU NEGATIVE 11/06/2018 2041   HGBUR SMALL (A) 11/06/2018 2041   HGBUR negative 10/15/2010 1251   BILIRUBINUR NEGATIVE 11/06/2018 2041   BILIRUBINUR n 12/01/2016 [place] 11/06/2018 2041   PROTEINUR >=300 (A) 11/06/2018 2041   UROBILINOGEN 0.2 12/01/2016 1521   UROBILINOGEN 0.2 10/15/2010 1251   NITRITE POSITIVE (A) 11/06/2018 2041   LEUKOCYTESUR LARGE (A) 11/06/2018 2041    BNP (last 3 results) No results for input(s): PROBNP in the last 8760 hours. CBG: No results for input(s): GLUCAP in the last 168 hours.  Radiological Exams on Admission: Dg Chest Portable 1 View  Result Date: 11/06/2018 CLINICAL DATA:  Altered mental status.  Fever. EXAM: PORTABLE CHEST 1 VIEW COMPARISON:  October 10, 2018 FINDINGS: The right Port-A-Cath is stable. Cardiomegaly is similar in the me interval. The hila and mediastinum are unchanged. No pneumothorax. No other acute abnormalities. IMPRESSION: Stable Port-A-Cath.  No cause for fever noted. Electronically Signed   By: Dorise Bullion III M.D   On: 11/06/2018 23:12   Ct Renal Stone Study  Result Date: 11/06/2018 CLINICAL DATA:  83 year old male with history of abdominal pain, fever and bladder spasms. Recurrent urinary tract infections. History of bladder cancer. EXAM: CT ABDOMEN AND PELVIS WITHOUT CONTRAST TECHNIQUE: Multidetector CT  imaging of the abdomen and pelvis was performed following the standard protocol without IV contrast. COMPARISON:  CT the abdomen and pelvis 10/24/2018. FINDINGS: Lower chest: Atherosclerotic calcifications in the right coronary artery. Small right and trace left pleural effusions lying dependently. Hepatobiliary: No definite suspicious cystic or solid hepatic lesions are confidently identified on today's noncontrast CT examination.  Unenhanced appearance of the gallbladder is normal. Pancreas: No definite pancreatic mass or peripancreatic fluid or inflammatory changes noted on today's noncontrast CT examination. Spleen: Unremarkable. Adrenals/Urinary Tract: Bilateral nephrostomy tubes in position, with extension catheters extending into the urinary bladder. Suprapubic catheter extending into the urinary bladder. Left extrarenal pelvis. Bilateral adrenal glands are normal in appearance. Other than the indwelling catheters, urinary bladder is otherwise normal in appearance. Stomach/Bowel: Unenhanced appearance of the stomach is normal. No pathologic dilatation of small bowel or colon. Numerous colonic diverticulae are noted, without surrounding inflammatory changes to suggest an acute diverticulitis at this time. Left upper quadrant colostomy. Normal appendix. Vascular/Lymphatic: Aortic atherosclerosis. No lymphadenopathy noted in the abdomen or pelvis. Reproductive: Prostate gland and seminal vesicles are unremarkable in appearance. Other: Trace volume of ascites.  No pneumoperitoneum. Musculoskeletal: Status post PLIF at L3-L4. There are no aggressive appearing lytic or blastic lesions noted in the visualized portions of the skeleton. IMPRESSION: 1. No acute findings are noted in the abdomen or pelvis to account for the patient's symptoms. 2. Colonic diverticulosis without evidence of acute diverticulitis at this time. 3. Small right and trace left pleural effusions lying dependently. 4. Aortic atherosclerosis, in addition to at least right coronary artery disease. 5. Additional incidental findings, similar prior study, as above. Electronically Signed   By: Vinnie Langton M.D.   On: 11/06/2018 18:39    EKG:  pending admission EKG    ASSESSMENT AND PLAN   Assessment: Charles Coffey is a 83 y.o. male with CAD s/p CABG (2002) and HFrEF (EF25-30%), hiatal hernia, hx of colon cancer, hx of colonic obstruction s/p colostomy and bladder  cancer s/p b/l percutaneous nephrostomies and recent conversion of R nephrostomy to nephroureteral catheter, suprapubic catheter, hx of urosepsis who presents with fever and altered mentation. Given existing tubes and hx of urinary infections, strong suspicion for UTI again although will do broad infectious workup including flu panel. Note he has had prior MRSA in his urine. CT abd/pelvis shows nephrostomies are in good position.    Active Problems:   UTI (urinary tract infection)   Plan:   # Fever, concern for UTI in setting of suprapubic catheter >+positive UA; prior MRSA in urine cx on 08/21/18 and proteus in 06/2018 - broad abx to vanc + cefepime (D1 11/06/18) - received ceftriaxone IM 11/06/18 (discontinued) - s/p 1.5L fluids - lactate pending - bladder scan q shift and strict ins/outs - hold off further fluids given hemodynamically stable and HFrEF - urology consult in AM -- discuss if need IR nephroureteral cath exchange on this admission - pain control with prn dilaudid 0.5mg  q4h prn severe pain and percoset q6h prn mod pain - may benefit from palliative care consult  # Delirium in setting of infection and fever > was started on zyprexa after last hospitalization with delirium and currently on taper at 2.5mg  dose  - tele sitter  - trial higher dose zyprexa 10mg  x 1 overnight  - may resume lower dose zyprexa at 2.5 or 5mg  tomorrow night  - holding home duloxetine given delirium    # AKI Cr 1.86 with baseline Cr 1.5-1.7, likely multifactorial  pre-renal + intrinsic.  > no evidence of obstruction at this time  - monitor daily BMP  # Anemia (chronic) Hb 7.9 macrocytic > normal B12 level (786) in 09/2018 and normal iron panel   - likely multifactorial from small urinary losses and chronic illness  - monitor daily CBC for now; no transfusion overnight  - transfusion goal Hb > 7    # Hx of CHF and HFrEF > currently euvolemic. Did receive 1.5 L fluids in ED  - monitor for volume  overload  - resume home prn lasix 40mg  if edema   DVT Prophylaxis: low dose lovenox  Code Status:  DNR Family Communication: wife  Disposition Plan: admit to inpatient with telemetry; dc pending clinical improvement and cultures   Patient contact: Extended Emergency Contact Information Primary Emergency Contact: Ayub,[name] Address: [place] Phone: [phone] Mobile Phone: [phone] Relation: Spouse  Time spent: > 35 mins  Colbert Ewing, MD [facility] Pager [phone]  If 7PM-7AM, please contact night-coverage [email or web address] Password [facility] 11/07/2018, 12:27 AM

## 2018-11-08 ENCOUNTER — Encounter (HOSPITAL_COMMUNITY): Payer: Self-pay | Admitting: Interventional Radiology

## 2018-11-08 ENCOUNTER — Inpatient Hospital Stay (HOSPITAL_COMMUNITY): Payer: Medicare HMO

## 2018-11-08 DIAGNOSIS — I5032 Chronic diastolic (congestive) heart failure: Secondary | ICD-10-CM

## 2018-11-08 DIAGNOSIS — G9340 Encephalopathy, unspecified: Secondary | ICD-10-CM

## 2018-11-08 DIAGNOSIS — D539 Nutritional anemia, unspecified: Secondary | ICD-10-CM

## 2018-11-08 HISTORY — PX: IR CATHETER TUBE CHANGE: IMG717

## 2018-11-08 HISTORY — PX: IR EXT NEPHROURETERAL CATH EXCHANGE: IMG5418

## 2018-11-08 LAB — BASIC METABOLIC PANEL
ANION GAP: 8 (ref 5–15)
BUN: 39 mg/dL — AB (ref 8–23)
CALCIUM: 8.4 mg/dL — AB (ref 8.9–10.3)
CO2: 18 mmol/L — ABNORMAL LOW (ref 22–32)
Chloride: 110 mmol/L (ref 98–111)
Creatinine, Ser: 2.13 mg/dL — ABNORMAL HIGH (ref 0.61–1.24)
GFR calc Af Amer: 32 mL/min — ABNORMAL LOW (ref >60)
GFR, EST NON AFRICAN AMERICAN: 27 mL/min — AB (ref >60)
Glucose, Bld: 97 mg/dL (ref 70–99)
POTASSIUM: 3.9 mmol/L (ref 3.5–5.1)
Sodium: 136 mmol/L (ref 135–145)

## 2018-11-08 LAB — CBC WITH DIFFERENTIAL/PLATELET
Abs Immature Granulocytes: 0.27 10*3/uL — ABNORMAL HIGH (ref 0.00–0.07)
BASOS PCT: 0 %
Basophils Absolute: 0 10*3/uL (ref 0.0–0.1)
EOS PCT: 1 %
Eosinophils Absolute: 0.1 10*3/uL (ref 0.0–0.5)
HEMATOCRIT: 24.2 % — AB (ref 39.0–52.0)
Hemoglobin: 7.6 g/dL — ABNORMAL LOW (ref 13.0–17.0)
Immature Granulocytes: 3 %
Lymphocytes Relative: 8 %
Lymphs Abs: 0.8 10*3/uL (ref 0.7–4.0)
MCH: 31.9 pg (ref 26.0–34.0)
MCHC: 31.4 g/dL (ref 30.0–36.0)
MCV: 101.7 fL — AB (ref 80.0–100.0)
MONO ABS: 0.8 10*3/uL (ref 0.1–1.0)
MONOS PCT: 8 %
Neutro Abs: 8.1 10*3/uL — ABNORMAL HIGH (ref 1.7–7.7)
Neutrophils Relative %: 80 %
PLATELETS: 53 10*3/uL — AB (ref 150–400)
RBC: 2.38 MIL/uL — AB (ref 4.22–5.81)
RDW: 19.4 % — AB (ref 11.5–15.5)
WBC: 10.1 10*3/uL (ref 4.0–10.5)
nRBC: 0 % (ref 0.0–0.2)

## 2018-11-08 LAB — MAGNESIUM: Magnesium: 1.9 mg/dL (ref 1.7–2.4)

## 2018-11-08 MED ORDER — IOPAMIDOL (ISOVUE-300) INJECTION 61%
INTRAVENOUS | Status: AC
  Administered 2018-11-08: 30 mL
  Filled 2018-11-08: qty 50

## 2018-11-08 MED ORDER — FENTANYL CITRATE (PF) 100 MCG/2ML IJ SOLN
INTRAMUSCULAR | Status: AC | PRN
  Administered 2018-11-08 (×6): 50 ug via INTRAVENOUS

## 2018-11-08 MED ORDER — IOPAMIDOL (ISOVUE-300) INJECTION 61%
50.0000 mL | Freq: Once | INTRAVENOUS | Status: AC | PRN
  Administered 2018-11-08: 30 mL

## 2018-11-08 MED ORDER — LIDOCAINE HCL (PF) 1 % IJ SOLN
INTRAMUSCULAR | Status: AC | PRN
  Administered 2018-11-08: 10 mL
  Administered 2018-11-08: 5 mL via SUBCUTANEOUS

## 2018-11-08 MED ORDER — FENTANYL CITRATE (PF) 100 MCG/2ML IJ SOLN
INTRAMUSCULAR | Status: AC
  Filled 2018-11-08: qty 6

## 2018-11-08 MED ORDER — LIDOCAINE HCL 1 % IJ SOLN
INTRAMUSCULAR | Status: AC
  Filled 2018-11-08: qty 20

## 2018-11-08 MED ORDER — MIDAZOLAM HCL 2 MG/2ML IJ SOLN
INTRAMUSCULAR | Status: AC
  Filled 2018-11-08: qty 6

## 2018-11-08 MED ORDER — SODIUM CHLORIDE 0.9% FLUSH
5.0000 mL | Freq: Three times a day (TID) | INTRAVENOUS | Status: DC
  Administered 2018-11-08 – 2018-11-09 (×4): 5 mL

## 2018-11-08 MED ORDER — MIDAZOLAM HCL 2 MG/2ML IJ SOLN
INTRAMUSCULAR | Status: AC | PRN
  Administered 2018-11-08: 1 mg via INTRAVENOUS
  Administered 2018-11-08: 2 mg via INTRAVENOUS
  Administered 2018-11-08 (×3): 1 mg via INTRAVENOUS

## 2018-11-08 NOTE — Progress Notes (Signed)
Pharmacy Brief Note Post-IR anticoagulation consult:  Patient previously on enoxaparin 30 mg subQ daily. Per discussion with MD, will hold pharmacologic DVT prophylaxis agents at this time given thrombocytopenia (Plt 53). SCDs are ordered.  Lenis Noon, PharmD 11/08/18 5:52 PM

## 2018-11-08 NOTE — Sedation Documentation (Signed)
Suprapubic catheter exchange procedure begun.

## 2018-11-08 NOTE — Procedures (Signed)
Interventional Radiology Procedure Note  Procedure: Routine exchange of bilateral percutaneous nephroureteral tubes.  Right is 22cm, Left is 24cm.  Exchange of supra-pubic catheter.    Complications: None  Recommendations:  - To gravity.  May return to capped before DC home - Do not submerge - Routine care   Signed,  Dulcy Fanny. Earleen Newport, DO

## 2018-11-08 NOTE — Sedation Documentation (Signed)
Nephroureteral catheter exchanges completed.

## 2018-11-08 NOTE — Progress Notes (Signed)
PT Cancellation Note  Patient Details Name: Charles Coffey MRN: [id number] DOB: 01-21-1935   Cancelled Treatment:    Reason Eval/Treat Not Completed: Patient at procedure or test/unavailable   [name] 11/08/2018, 4:47 PM  [place] Pager [phone] Office [phone]

## 2018-11-08 NOTE — Progress Notes (Addendum)
PROGRESS NOTE    Charles Coffey  RVI:[id number] DOB: 02/19/1935 DOA: 11/06/2018 PCP: [name], MD      Brief Narrative:  Charles Coffey is a 83 y.o. M with CAD s/p CABG 2002, CHF EF 25%, colon cancer s/p obstruction and colostomy, bladder cancer with nephrostomy tubes and SP catheter who presents with fever, abdominal pain.         Assessment & Plan:  Sepsis from UTI causing Proteus bacteremia Sepsis and UTI associated with suprapubic urinary catheter Discussed with Urology on admission.  Nephrostomy tubes to drainage.  IR consulted for nephrostomy tube exchange and SP exchange.   Blood and urine culture growing Proteus. -Continue ceftriaxone -Follow blood and urine cultures -IR consult for exchange neph tubes and SP cath, appreciate cares   AKI Baseline Cr 1.5-1.7.  Trended up to 2.1 overnight. -Contineu IV fluids -Ensure tubes to drainage  Acute on chronic macrocytic anemia Stable relative to baseline, 7.5-8.5 now.  Thrombocytopenia -Hold heparins  Chronic systolic CHF EF 61%.  No evidence of fluid overload.  Cautious with fluids overnight. -Hold carvedilol and Lasix -Intake and output  Delirium He is still intermittently very confused from baseline.  This is an Acute metabolic encecphalopathy from infection. -Continue Zyprexa -Hold duloxetine for now  Hyponatremia Stable, no change  Other medciations -Continue Mirapex     DVT prophylaxis: SCDs Code Status: DO NOT RESUSCIATATE Family Communication: Wife at bedisde MDM and disposition Plan:  The below labs and imaging reports were reviewed and summarized above.  Medication management as above.  The patient was admitted with sepsis from proteus bacteremia,with aki and delirium.  This is a severe and potentially lethal illness with multiple organ injury (aki delirium) in a patietn with bladder and colon cancer with severe anemia and thrombocytopenia and CHF EF 20%.    Objective: Vitals:   11/08/18 1610 11/08/18 1615 11/08/18 1620 11/08/18 2108  BP: 101/63 119/69 116/77 115/66  Pulse: 79 83 87 93  Resp: 16 16 15 18   Temp:    98.5 F (36.9 C)  TempSrc:    Oral  SpO2: 91% 98% 97% 96%  Weight:      Height:        Intake/Output Summary (Last 24 hours) at 11/08/2018 2142 Last data filed at 11/08/2018 2006 Gross per 24 hour  Intake 2306.61 ml  Output 1875 ml  Net 431.61 ml   Filed Weights   11/06/18 1653 11/07/18 0021  Weight: 81.6 kg 84.7 kg    Examination:   General Thin elderly male, lying in bed, appears tired, interactive. HEENT: Corneas clear, conjunctivae and sclerae normal without injection or icterus, lids and lashes normal.  Visual tracking smooth.  OP moist without erythema, exudates, cobblestoning, or ulcers.  No airway deformities.  Neck supple.   Cardiac: Tachycardic, regular, no murmurs, JVP not elevated, no lower extremity edema Respiratory: Normal respiratory rate and rhythm, lungs clear without rales or wheezes. Abdomen: No focal tenderness, colostomy with liquid brown stool, no hematochezia, no guarding. Extremities: No effusions or deformities of the large joints of the upper lower extremities bilaterally.  Diffuse loss of subcutaneous muscle mass and fat. Neuro: Annual nerves normal, globally very weak, coordination symmetric, speech fluent. Psych: Thought content tangential, forgetful, attention normal, affect pleasant     Data Reviewed: I have personally reviewed following labs and imaging studies:  CBC: Recent Labs  Lab 11/06/18 1958 11/07/18 0123 11/08/18 0417  WBC 10.0 9.3 10.1  NEUTROABS  --  7.5 8.1*  HGB 7.9* 7.2* 7.6*  HCT 25.1* 23.3* 24.2*  MCV 101.2* 102.2* 101.7*  PLT 66* 61* 53*   Basic Metabolic Panel: Recent Labs  Lab 11/06/18 1958 11/07/18 0123 11/08/18 0417  NA 133* 133* 136  K 4.6 4.7 3.9  CL 104 107 110  CO2 20* 17* 18*  GLUCOSE 93 107* 97  BUN 31* 33* 39*  CREATININE 1.86* 1.89* 2.13*  CALCIUM 9.0 8.4*  8.4*  MG  --  1.8 1.9   GFR: Estimated Creatinine Clearance: 28.8 mL/min (A) (by C-G formula based on SCr of 2.13 mg/dL (H)). Liver Function Tests: Recent Labs  Lab 11/06/18 1958  AST 17  ALT 13  ALKPHOS 58  BILITOT 1.3*  PROT 6.9  ALBUMIN 3.4*   No results for input(s): LIPASE, AMYLASE in the last 168 hours. No results for input(s): AMMONIA in the last 168 hours. Coagulation Profile: No results for input(s): INR, PROTIME in the last 168 hours. Cardiac Enzymes: No results for input(s): CKTOTAL, CKMB, CKMBINDEX, TROPONINI in the last 168 hours. BNP (last 3 results) No results for input(s): PROBNP in the last 8760 hours. HbA1C: No results for input(s): HGBA1C in the last 72 hours. CBG: No results for input(s): GLUCAP in the last 168 hours. Lipid Profile: No results for input(s): CHOL, HDL, LDLCALC, TRIG, CHOLHDL, LDLDIRECT in the last 72 hours. Thyroid Function Tests: No results for input(s): TSH, T4TOTAL, FREET4, T3FREE, THYROIDAB in the last 72 hours. Anemia Panel: No results for input(s): VITAMINB12, FOLATE, FERRITIN, TIBC, IRON, RETICCTPCT in the last 72 hours. Urine analysis:    Component Value Date/Time   COLORURINE YELLOW 11/06/2018 2041   APPEARANCEUR HAZY (A) 11/06/2018 2041   LABSPEC 1.011 11/06/2018 2041   PHURINE 8.0 11/06/2018 2041   GLUCOSEU NEGATIVE 11/06/2018 2041   HGBUR SMALL (A) 11/06/2018 2041   HGBUR negative 10/15/2010 1251   BILIRUBINUR NEGATIVE 11/06/2018 2041   BILIRUBINUR n 12/01/2016 [place] 11/06/2018 2041   PROTEINUR >=300 (A) 11/06/2018 2041   UROBILINOGEN 0.2 12/01/2016 1521   UROBILINOGEN 0.2 10/15/2010 1251   NITRITE POSITIVE (A) 11/06/2018 2041   LEUKOCYTESUR LARGE (A) 11/06/2018 2041   Sepsis Labs: @LABRCNTIP (procalcitonin:4,lacticacidven:4)  ) Recent Results (from the past 240 hour(s))  Blood culture (routine x 2)     Status: Abnormal (Preliminary result)   Collection Time: 11/06/18  5:10 PM  Result Value  Ref Range Status   Specimen Description   Final    BLOOD RIGHT ANTECUBITAL Performed at [facility], [place] [address] [place], [place] [address]    Special Requests   Final    BOTTLES DRAWN AEROBIC AND ANAEROBIC Blood Culture results may not be optimal due to an excessive volume of blood received in culture bottles Performed at [place] [address], [place], [place] [address]    Culture  Setup Time   Final    GRAM NEGATIVE RODS CRITICAL RESULT CALLED TO, READ BACK BY AND VERIFIED WITH: Melodye Ped [facility] 11/07/18 1837 JDW IN BOTH AEROBIC AND ANAEROBIC BOTTLES    Culture (A)  Final    PROTEUS MIRABILIS SUSCEPTIBILITIES TO FOLLOW Performed at [facility], [place] [address], [place], [place] [address]    Report Status PENDING  Incomplete  Blood Culture ID Panel (Reflexed)     Status: Abnormal   Collection Time: 11/06/18  5:10 PM  Result Value Ref Range Status   Enterococcus species NOT DETECTED NOT DETECTED Final   Listeria monocytogenes NOT DETECTED  NOT DETECTED Final   Staphylococcus species NOT DETECTED NOT DETECTED Final   Staphylococcus aureus (BCID) NOT DETECTED NOT DETECTED Final   Streptococcus species NOT DETECTED NOT DETECTED Final   Streptococcus agalactiae NOT DETECTED NOT DETECTED Final   Streptococcus pneumoniae NOT DETECTED NOT DETECTED Final   Streptococcus pyogenes NOT DETECTED NOT DETECTED Final   Acinetobacter baumannii NOT DETECTED NOT DETECTED Final   Enterobacteriaceae species DETECTED (A) NOT DETECTED Final    Comment: Enterobacteriaceae represent a large family of gram-negative bacteria, not a single organism. CRITICAL RESULT CALLED TO, READ BACK BY AND VERIFIED WITH: Melodye Ped [facility] 11/07/18 1837 JDW    Enterobacter cloacae complex NOT DETECTED NOT DETECTED Final   Escherichia coli NOT DETECTED NOT DETECTED Final   Klebsiella oxytoca NOT DETECTED NOT DETECTED Final   Klebsiella pneumoniae NOT DETECTED NOT  DETECTED Final   Proteus species DETECTED (A) NOT DETECTED Final    Comment: CRITICAL RESULT CALLED TO, READ BACK BY AND VERIFIED WITH: J GADHIA PHARMD 11/07/18 1837 JDW    Serratia marcescens NOT DETECTED NOT DETECTED Final   Carbapenem resistance NOT DETECTED NOT DETECTED Final   Haemophilus influenzae NOT DETECTED NOT DETECTED Final   Neisseria meningitidis NOT DETECTED NOT DETECTED Final   Pseudomonas aeruginosa NOT DETECTED NOT DETECTED Final   Candida albicans NOT DETECTED NOT DETECTED Final   Candida glabrata NOT DETECTED NOT DETECTED Final   Candida krusei NOT DETECTED NOT DETECTED Final   Candida parapsilosis NOT DETECTED NOT DETECTED Final   Candida tropicalis NOT DETECTED NOT DETECTED Final    Comment: Performed at [facility], [place] [address] [place], [place] [address]  Urine culture     Status: Abnormal (Preliminary result)   Collection Time: 11/06/18 10:14 PM  Result Value Ref Range Status   Specimen Description   Final    URINE, RANDOM Performed at [place] [address] [place], [place] [address]    Special Requests   Final    NONE Performed at [facility], [place] [address] [place], [place] [address]    Culture (A)  Final    >=100,000 COLONIES/mL PROTEUS MIRABILIS SUSCEPTIBILITIES TO FOLLOW Performed at [facility], [place] [address], [place], [place] [address]    Report Status PENDING  Incomplete  MRSA PCR Screening     Status: None   Collection Time: 11/07/18 12:32 AM  Result Value Ref Range Status   MRSA by PCR NEGATIVE NEGATIVE Final    Comment:        The GeneXpert MRSA Assay (FDA approved for NASAL specimens only), is one component of a comprehensive MRSA colonization surveillance program. It is not intended to diagnose MRSA infection nor to guide or monitor treatment for MRSA infections. Performed at [facility], [place] [address] [place], [place] [address]   Blood culture  (routine x 2)     Status: None (Preliminary result)   Collection Time: 11/07/18  1:23 AM  Result Value Ref Range Status   Specimen Description   Final    BLOOD LEFT ANTECUBITAL Performed at [facility] [address], [place], [place] [address]    Special Requests   Final    BOTTLES DRAWN AEROBIC ONLY Blood Culture adequate volume Performed at [place] [address] [place], [place] [address]    Culture   Final    NO GROWTH 1 DAY Performed at [facility], [place] [address],  [place], [place] [address]    Report Status PENDING  Incomplete         Radiology Studies: Ir Catheter Tube Change  Result Date: 11/08/2018 INDICATION: 83 year old male admitted with urinary tract infection. He has a history of bilateral ureteral obstruction secondary to bladder carcinoma. He has existing nephroureteral tubes. Percutaneous suprapubic catheter was placed 08/19/2018 for outlet obstruction. EXAM: IMAGE GUIDED EXCHANGE OF BILATERAL PERCUTANEOUS NEPHROURETERAL CATHETER IMAGE GUIDED EXCHANGE OF SUPRAPUBIC DRAINAGE CATHETER COMPARISON:  09/20/2018, 08/19/2018, 08/16/2018, 08/09/2018 MEDICATIONS: None ANESTHESIA/SEDATION: Fentanyl 300 mcg IV; Versed 6 mg IV Moderate Sedation Time:  45 minutes The patient was continuously monitored during the procedure by the interventional radiology nurse under my direct supervision. CONTRAST:  68mL ISOVUE-300 IOPAMIDOL (ISOVUE-300) INJECTION 61%, 60mL ISOVUE-300 IOPAMIDOL (ISOVUE-300) INJECTION 61% - administered into the collecting system(s) FLUOROSCOPY TIME:  Fluoroscopy Time: 4 minutes 0 seconds (67 mGy). COMPLICATIONS: None PROCEDURE: Informed written consent was obtained from the patient after a thorough discussion of the procedural risks, benefits and alternatives. All questions were addressed. Maximal Sterile Barrier Technique was utilized including caps, mask, sterile gowns, sterile gloves, sterile drape, hand hygiene  and skin antiseptic. A timeout was performed prior to the initiation of the procedure. Patient position prone position. The bilateral indwelling percutaneous nephroureteral tubes were prepped and draped in the usual sterile fashion. We addressed the left first. 1% lidocaine was used for local anesthesia. Contrast was infused confirming location. The catheter was ligated and the catheter was removed on a Bentson wire. With the Bentson wire in the ureter, a new 24 cm ten French percutaneous nephroureteral catheter was advanced. Loop was formed in the urinary bladder and in the pelvis of the kidney. Contrast injection confirmed location. Suture was placed as a retention suture. We then addressed the right. 1% lidocaine was used for local anesthesia. Contrast was infused confirming location. Catheter was ligated and the catheter was attempted to be removed on a Bentson wire. The Bentson wire would not pass through the distal end of the catheter. Bentson wire was removed and a exchange length Glidewire was used. Glidewire passed into the ureter and the catheter was removed from the [place]. A new 22 cm 10 French percutaneous nephroureteral catheter was advanced. Loop was formed in the urinary bladder and in the pelvis of the kidney. Contrast confirmed location. A suture was placed for retention suture. The patient was then moved into a supine position. The suprapubic catheter and the skin and subcutaneous tissues were prepped and draped in the usual sterile fashion. 1% lidocaine was used for local anesthesia. Bentson wire is passed in the urinary bladder under imaging. Catheter was removed. 38 [place] dilator was placed. A new 16 French pigtail catheters placed into the urinary bladder with loop formed in the urinary bladder. Contrast confirmed location. Retention suture was placed. Patient tolerated the procedure well and remained hemodynamically stable throughout. No complications were encountered and no  significant blood loss. IMPRESSION: Status post routine exchange of bilateral percutaneous nephroureteral tubes with left 24 cm drainage catheter and right 22 cm drainage catheter. Suprapubic catheter exchange was also performed with up sizing to a 16 French pigtail catheter. Signed, Dulcy Fanny. Dellia Nims, RPVI [facility] Radiology Electronically Signed   By: Corrie Mckusick D.O.   On: 11/08/2018 17:18   Dg Chest Portable 1 View  Result Date: 11/06/2018 CLINICAL DATA:  Altered mental status.  Fever. EXAM: PORTABLE CHEST 1 VIEW COMPARISON:  October 10, 2018 FINDINGS: The right Port-A-Cath is stable.  Cardiomegaly is similar in the me interval. The hila and mediastinum are unchanged. No pneumothorax. No other acute abnormalities. IMPRESSION: Stable Port-A-Cath.  No cause for fever noted. Electronically Signed   By: Dorise Bullion III M.D   On: 11/06/2018 23:12   Ir Ext Nephroureteral Cath Exchange  Result Date: 11/08/2018 INDICATION: 83 year old male admitted with urinary tract infection. He has a history of bilateral ureteral obstruction secondary to bladder carcinoma. He has existing nephroureteral tubes. Percutaneous suprapubic catheter was placed 08/19/2018 for outlet obstruction. EXAM: IMAGE GUIDED EXCHANGE OF BILATERAL PERCUTANEOUS NEPHROURETERAL CATHETER IMAGE GUIDED EXCHANGE OF SUPRAPUBIC DRAINAGE CATHETER COMPARISON:  09/20/2018, 08/19/2018, 08/16/2018, 08/09/2018 MEDICATIONS: None ANESTHESIA/SEDATION: Fentanyl 300 mcg IV; Versed 6 mg IV Moderate Sedation Time:  45 minutes The patient was continuously monitored during the procedure by the interventional radiology nurse under my direct supervision. CONTRAST:  28mL ISOVUE-300 IOPAMIDOL (ISOVUE-300) INJECTION 61%, 23mL ISOVUE-300 IOPAMIDOL (ISOVUE-300) INJECTION 61% - administered into the collecting system(s) FLUOROSCOPY TIME:  Fluoroscopy Time: 4 minutes 0 seconds (67 mGy). COMPLICATIONS: None PROCEDURE:  Informed written consent was obtained from the patient after a thorough discussion of the procedural risks, benefits and alternatives. All questions were addressed. Maximal Sterile Barrier Technique was utilized including caps, mask, sterile gowns, sterile gloves, sterile drape, hand hygiene and skin antiseptic. A timeout was performed prior to the initiation of the procedure. Patient position prone position. The bilateral indwelling percutaneous nephroureteral tubes were prepped and draped in the usual sterile fashion. We addressed the left first. 1% lidocaine was used for local anesthesia. Contrast was infused confirming location. The catheter was ligated and the catheter was removed on a Bentson wire. With the Bentson wire in the ureter, a new 24 cm ten French percutaneous nephroureteral catheter was advanced. Loop was formed in the urinary bladder and in the pelvis of the kidney. Contrast injection confirmed location. Suture was placed as a retention suture. We then addressed the right. 1% lidocaine was used for local anesthesia. Contrast was infused confirming location. Catheter was ligated and the catheter was attempted to be removed on a Bentson wire. The Bentson wire would not pass through the distal end of the catheter. Bentson wire was removed and a exchange length Glidewire was used. Glidewire passed into the ureter and the catheter was removed from the [place]. A new 22 cm 10 French percutaneous nephroureteral catheter was advanced. Loop was formed in the urinary bladder and in the pelvis of the kidney. Contrast confirmed location. A suture was placed for retention suture. The patient was then moved into a supine position. The suprapubic catheter and the skin and subcutaneous tissues were prepped and draped in the usual sterile fashion. 1% lidocaine was used for local anesthesia. Bentson wire is passed in the urinary bladder under imaging. Catheter was removed. 62 [place] dilator was placed. A new  16 French pigtail catheters placed into the urinary bladder with loop formed in the urinary bladder. Contrast confirmed location. Retention suture was placed. Patient tolerated the procedure well and remained hemodynamically stable throughout. No complications were encountered and no significant blood loss. IMPRESSION: Status post routine exchange of bilateral percutaneous nephroureteral tubes with left 24 cm drainage catheter and right 22 cm drainage catheter. Suprapubic catheter exchange was also performed with up sizing to a 16 French pigtail catheter. Signed, Dulcy Fanny. Dellia Nims, RPVI [facility] Radiology Electronically Signed   By: Corrie Mckusick D.O.   On: 11/08/2018 17:18   Ir Ext Nephroureteral Cath Exchange  Result Date:  11/08/2018 INDICATION: 83 year old male admitted with urinary tract infection. He has a history of bilateral ureteral obstruction secondary to bladder carcinoma. He has existing nephroureteral tubes. Percutaneous suprapubic catheter was placed 08/19/2018 for outlet obstruction. EXAM: IMAGE GUIDED EXCHANGE OF BILATERAL PERCUTANEOUS NEPHROURETERAL CATHETER IMAGE GUIDED EXCHANGE OF SUPRAPUBIC DRAINAGE CATHETER COMPARISON:  09/20/2018, 08/19/2018, 08/16/2018, 08/09/2018 MEDICATIONS: None ANESTHESIA/SEDATION: Fentanyl 300 mcg IV; Versed 6 mg IV Moderate Sedation Time:  45 minutes The patient was continuously monitored during the procedure by the interventional radiology nurse under my direct supervision. CONTRAST:  85mL ISOVUE-300 IOPAMIDOL (ISOVUE-300) INJECTION 61%, 36mL ISOVUE-300 IOPAMIDOL (ISOVUE-300) INJECTION 61% - administered into the collecting system(s) FLUOROSCOPY TIME:  Fluoroscopy Time: 4 minutes 0 seconds (67 mGy). COMPLICATIONS: None PROCEDURE: Informed written consent was obtained from the patient after a thorough discussion of the procedural risks, benefits and alternatives. All questions were addressed. Maximal Sterile  Barrier Technique was utilized including caps, mask, sterile gowns, sterile gloves, sterile drape, hand hygiene and skin antiseptic. A timeout was performed prior to the initiation of the procedure. Patient position prone position. The bilateral indwelling percutaneous nephroureteral tubes were prepped and draped in the usual sterile fashion. We addressed the left first. 1% lidocaine was used for local anesthesia. Contrast was infused confirming location. The catheter was ligated and the catheter was removed on a Bentson wire. With the Bentson wire in the ureter, a new 24 cm ten French percutaneous nephroureteral catheter was advanced. Loop was formed in the urinary bladder and in the pelvis of the kidney. Contrast injection confirmed location. Suture was placed as a retention suture. We then addressed the right. 1% lidocaine was used for local anesthesia. Contrast was infused confirming location. Catheter was ligated and the catheter was attempted to be removed on a Bentson wire. The Bentson wire would not pass through the distal end of the catheter. Bentson wire was removed and a exchange length Glidewire was used. Glidewire passed into the ureter and the catheter was removed from the [place]. A new 22 cm 10 French percutaneous nephroureteral catheter was advanced. Loop was formed in the urinary bladder and in the pelvis of the kidney. Contrast confirmed location. A suture was placed for retention suture. The patient was then moved into a supine position. The suprapubic catheter and the skin and subcutaneous tissues were prepped and draped in the usual sterile fashion. 1% lidocaine was used for local anesthesia. Bentson wire is passed in the urinary bladder under imaging. Catheter was removed. 71 [place] dilator was placed. A new 16 French pigtail catheters placed into the urinary bladder with loop formed in the urinary bladder. Contrast confirmed location. Retention suture was placed. Patient tolerated the  procedure well and remained hemodynamically stable throughout. No complications were encountered and no significant blood loss. IMPRESSION: Status post routine exchange of bilateral percutaneous nephroureteral tubes with left 24 cm drainage catheter and right 22 cm drainage catheter. Suprapubic catheter exchange was also performed with up sizing to a 16 French pigtail catheter. Signed, Dulcy Fanny. Dellia Nims, RPVI [facility] Radiology Electronically Signed   By: Corrie Mckusick D.O.   On: 11/08/2018 17:18        Scheduled Meds: . feeding supplement  1 Container Oral TID BM  . OLANZapine zydis  5 mg Oral QHS  . pramipexole  1.5 mg Oral q1800  . sodium chloride flush  5 mL Intracatheter Q8H   Continuous Infusions: . sodium chloride 250 mL (11/07/18 0033)  . sodium chloride 100 mL/hr at  11/08/18 2016  . cefTRIAXone (ROCEPHIN)  IV 2 g (11/08/18 2130)     LOS: 2 days    Time spent: 35 minutes    Edwin Dada, MD [facility] 11/08/2018, 9:42 PM     Pager [phone] --- please page though AMION:  [email or web address] If 7PM-7AM, please contact night-coverage

## 2018-11-09 DIAGNOSIS — I10 Essential (primary) hypertension: Secondary | ICD-10-CM

## 2018-11-09 DIAGNOSIS — A419 Sepsis, unspecified organism: Secondary | ICD-10-CM | POA: Diagnosis present

## 2018-11-09 DIAGNOSIS — R6520 Severe sepsis without septic shock: Secondary | ICD-10-CM

## 2018-11-09 DIAGNOSIS — D696 Thrombocytopenia, unspecified: Secondary | ICD-10-CM

## 2018-11-09 DIAGNOSIS — N3000 Acute cystitis without hematuria: Secondary | ICD-10-CM

## 2018-11-09 DIAGNOSIS — A498 Other bacterial infections of unspecified site: Secondary | ICD-10-CM | POA: Diagnosis present

## 2018-11-09 DIAGNOSIS — N179 Acute kidney failure, unspecified: Secondary | ICD-10-CM | POA: Diagnosis present

## 2018-11-09 DIAGNOSIS — I5022 Chronic systolic (congestive) heart failure: Secondary | ICD-10-CM

## 2018-11-09 LAB — URINE CULTURE: Culture: >=100000 — AB

## 2018-11-09 LAB — CBC WITH DIFFERENTIAL/PLATELET
Abs Immature Granulocytes: 0.14 10*3/uL — ABNORMAL HIGH (ref 0.00–0.07)
Basophils Absolute: 0 10*3/uL (ref 0.0–0.1)
Basophils Relative: 0 %
EOS PCT: 2 %
Eosinophils Absolute: 0.2 10*3/uL (ref 0.0–0.5)
HCT: 25.8 % — ABNORMAL LOW (ref 39.0–52.0)
HEMOGLOBIN: 8.1 g/dL — AB (ref 13.0–17.0)
IMMATURE GRANULOCYTES: 2 %
LYMPHS ABS: 0.9 10*3/uL (ref 0.7–4.0)
LYMPHS PCT: 11 %
MCH: 31.6 pg (ref 26.0–34.0)
MCHC: 31.4 g/dL (ref 30.0–36.0)
MCV: 100.8 fL — ABNORMAL HIGH (ref 80.0–100.0)
Monocytes Absolute: 0.7 10*3/uL (ref 0.1–1.0)
Monocytes Relative: 8 %
NEUTROS PCT: 77 %
NRBC: 0 % (ref 0.0–0.2)
Neutro Abs: 6.6 10*3/uL (ref 1.7–7.7)
Platelets: 60 10*3/uL — ABNORMAL LOW (ref 150–400)
RBC: 2.56 MIL/uL — AB (ref 4.22–5.81)
RDW: 19 % — AB (ref 11.5–15.5)
WBC: 8.6 10*3/uL (ref 4.0–10.5)

## 2018-11-09 LAB — BASIC METABOLIC PANEL
ANION GAP: 7 (ref 5–15)
BUN: 37 mg/dL — ABNORMAL HIGH (ref 8–23)
CHLORIDE: 110 mmol/L (ref 98–111)
CO2: 18 mmol/L — ABNORMAL LOW (ref 22–32)
CREATININE: 1.88 mg/dL — AB (ref 0.61–1.24)
Calcium: 8.3 mg/dL — ABNORMAL LOW (ref 8.9–10.3)
GFR calc Af Amer: 37 mL/min — ABNORMAL LOW (ref >60)
GFR, EST NON AFRICAN AMERICAN: 32 mL/min — AB (ref >60)
Glucose, Bld: 144 mg/dL — ABNORMAL HIGH (ref 70–99)
Potassium: 3.7 mmol/L (ref 3.5–5.1)
SODIUM: 135 mmol/L (ref 135–145)

## 2018-11-09 LAB — MAGNESIUM: Magnesium: 1.9 mg/dL (ref 1.7–2.4)

## 2018-11-09 LAB — CULTURE, BLOOD (ROUTINE X 2)

## 2018-11-09 MED ORDER — AMOXICILLIN-POT CLAVULANATE 875-125 MG PO TABS
1.0000 | ORAL_TABLET | Freq: Two times a day (BID) | ORAL | Status: DC
  Administered 2018-11-09 – 2018-11-10 (×2): 1 via ORAL
  Filled 2018-11-09 (×2): qty 1

## 2018-11-09 NOTE — Progress Notes (Signed)
PROGRESS NOTE    JAREE TRINKA  SEG:[id number] DOB: [birth year] DOA: 11/06/2018 PCP: [name], MD   Brief Narrative:  Mr. Shiffler is a 83 y.o. M with CAD s/p CABG 2002, CHF EF 25%, colon cancer s/p obstruction and colostomy, bladder cancer with nephrostomy tubes and SP catheter who presents with fever, abdominal pain.   Patient placed empirically on IV antibiotics while cultures were pending.  Work-up revealed Proteus bacteremia and Proteus UTI.  Bilateral percutaneous nephrostomy tubes were exchanged as well as suprapubic catheter.  Patient improved clinically.    Assessment & Plan:   Principal Problem:   Sepsis ([place]) Active Problems:   Essential hypertension   GERD   Chronic kidney disease, stage 3 (HCC)   Urothelial carcinoma of bladder (HCC)   Thrombocytopenia (HCC)   Chronic systolic CHF (congestive heart failure) (HCC)   UTI (urinary tract infection)   Bacterial infection due to Proteus mirabilis   AKI (acute kidney injury) ([place])   Sepsis secondary to Proteus bacteremia and Proteus UTI with suprapubic urinary catheter. Dr. Loleta Books discussed with Urology on admission.  Nephrostomy tubes to drainage.  IR consulted for nephrostomy tube exchange and SP exchange which was done on 11/08/2018 without any complications.  Blood cultures and urine cultures positive for Proteus mirabilis which was resistant to ciprofloxacin, Bactrim.  Sensitive to ampicillin sulbactam and Zosyn and the cephalosporins.  Patient currently afebrile.  Patient was on IV Rocephin.  Will transition to oral Augmentin and treat for total of 2 weeks.  Discussed with ID.  Will need follow-up with urology in the outpatient setting.   AKI Baseline Cr 1.5-1.7.    Creatinine went up as high as 2.13 and improved and currently at 1.88.  Patient was given gentle hydration.  Nephrostomy tubes draining well.  Follow.    Acute on chronic macrocytic anemia Stable relative to baseline, hemoglobin currently at  8.1.    Thrombocytopenia -Patient with no overt bleeding.  Platelets stable.  Hold anticoagulation.   Chronic systolic CHF EF 73%.  No evidence of fluid overload.  Cautious with fluids overnight. -Coreg and Lasix on hold.  Monitor fluid status closely.   Acute metabolic encephalopathy Likely secondary to Proteus UTI and Proteus bacteremia.  Patient improving slowly in terms of his mentation that is proving daily.  Continue empiric antibiotics.  Continue Zyprexa.  Cymbalta on hold.  Likely resume Cymbalta on discharge.    Hyponatremia Improved with gentle hydration.  Follow.   Other medciations -Continue Mirapex  DVT prophylaxis: SCDs Code Status: DNR Family Communication: Updated patient and wife at bedside. Disposition Plan: Likely home in the next 24 hours if remains afebrile and continued clinical improvement.   Consultants:   Interventional radiology    Procedures:   CT renal stone protocol 11/06/2018  Chest x-ray 11/06/2017  Routine exchange bilateral perc nephroureteral tubes/exchange of suprapubic catheter per Dr. Earleen Newport 11/08/2018  Antimicrobials:   Oral Augmentin 11/09/2018  IV Rocephin 11/06/2018>>>> 11/09/2018  IV cefepime 11/06/2018>>>> 11/07/2018   Subjective: Patient sitting up in chair alert oriented to self place and time.  Feeling better.  Denies any chest pain no shortness of breath.  Objective: Vitals:   11/08/18 2108 11/09/18 0453 11/09/18 0808 11/09/18 1334  BP: 115/66 119/63 121/75 (!) 100/57  Pulse: 93 85 86 83  Resp: 18 20 (!) 22   Temp: 98.5 F (36.9 C) 98.4 F (36.9 C) 98.3 F (36.8 C) 98 F (36.7 C)  TempSrc: Oral Oral Oral Oral  SpO2: 96% 98%  97% 99%  Weight:      Height:        Intake/Output Summary (Last 24 hours) at 11/09/2018 1732 Last data filed at 11/09/2018 1215 Gross per 24 hour  Intake 1520 ml  Output 2225 ml  Net -705 ml   Filed Weights   11/06/18 1653 11/07/18 0021  Weight: 81.6 kg 84.7 kg     Examination:  General exam: Appears calm and comfortable  Respiratory system: Clear to auscultation. Respiratory effort normal. Cardiovascular system: S1 & S2 heard, RRR. No JVD, murmurs, rubs, gallops or clicks. No pedal edema. Gastrointestinal system: Abdomen is nondistended, soft and nontender. No organomegaly or masses felt. Normal bowel sounds heard.  Colostomy bag intact with liquid brown stool. Central nervous system: Alert and oriented. No focal neurological deficits. Extremities: Symmetric 5 x 5 power. Skin: No rashes, lesions or ulcers Psychiatry: Judgement and insight appear normal. Mood & affect appropriate.     Data Reviewed: I have personally reviewed following labs and imaging studies  CBC: Recent Labs  Lab 11/06/18 1958 11/07/18 0123 11/08/18 0417 11/09/18 0406  WBC 10.0 9.3 10.1 8.6  NEUTROABS  --  7.5 8.1* 6.6  HGB 7.9* 7.2* 7.6* 8.1*  HCT 25.1* 23.3* 24.2* 25.8*  MCV 101.2* 102.2* 101.7* 100.8*  PLT 66* 61* 53* 60*   Basic Metabolic Panel: Recent Labs  Lab 11/06/18 1958 11/07/18 0123 11/08/18 0417 11/09/18 0406  NA 133* 133* 136 135  K 4.6 4.7 3.9 3.7  CL 104 107 110 110  CO2 20* 17* 18* 18*  GLUCOSE 93 107* 97 144*  BUN 31* 33* 39* 37*  CREATININE 1.86* 1.89* 2.13* 1.88*  CALCIUM 9.0 8.4* 8.4* 8.3*  MG  --  1.8 1.9 1.9   GFR: Estimated Creatinine Clearance: 32.6 mL/min (A) (by C-G formula based on SCr of 1.88 mg/dL (H)). Liver Function Tests: Recent Labs  Lab 11/06/18 1958  AST 17  ALT 13  ALKPHOS 58  BILITOT 1.3*  PROT 6.9  ALBUMIN 3.4*   No results for input(s): LIPASE, AMYLASE in the last 168 hours. No results for input(s): AMMONIA in the last 168 hours. Coagulation Profile: No results for input(s): INR, PROTIME in the last 168 hours. Cardiac Enzymes: No results for input(s): CKTOTAL, CKMB, CKMBINDEX, TROPONINI in the last 168 hours. BNP (last 3 results) No results for input(s): PROBNP in the last 8760 hours. HbA1C: No  results for input(s): HGBA1C in the last 72 hours. CBG: No results for input(s): GLUCAP in the last 168 hours. Lipid Profile: No results for input(s): CHOL, HDL, LDLCALC, TRIG, CHOLHDL, LDLDIRECT in the last 72 hours. Thyroid Function Tests: No results for input(s): TSH, T4TOTAL, FREET4, T3FREE, THYROIDAB in the last 72 hours. Anemia Panel: No results for input(s): VITAMINB12, FOLATE, FERRITIN, TIBC, IRON, RETICCTPCT in the last 72 hours. Sepsis Labs: Recent Labs  Lab 11/07/18 0123  LATICACIDVEN 1.0    Recent Results (from the past 240 hour(s))  Blood culture (routine x 2)     Status: Abnormal   Collection Time: 11/06/18  5:10 PM  Result Value Ref Range Status   Specimen Description   Final    BLOOD RIGHT ANTECUBITAL Performed at [facility] [address], [place], [place] [address]    Special Requests   Final    BOTTLES DRAWN AEROBIC AND ANAEROBIC Blood Culture results may not be optimal due to an excessive volume of blood received in culture bottles Performed at [place] [address]  [place], [place] [address]    Culture  Setup Time   Final    GRAM NEGATIVE RODS CRITICAL RESULT CALLED TO, READ BACK BY AND VERIFIED WITH: Melodye Ped [facility] 11/07/18 1837 JDW IN BOTH AEROBIC AND ANAEROBIC BOTTLES Performed at [facility], [place] [address], [place], [place] [address]    Culture PROTEUS MIRABILIS (A)  Final   Report Status 11/09/2018 FINAL  Final   Organism ID, Bacteria PROTEUS MIRABILIS  Final      Susceptibility   Proteus mirabilis - MIC*    AMPICILLIN >=32 RESISTANT Resistant     CEFAZOLIN <=4 SENSITIVE Sensitive     CEFEPIME <=1 SENSITIVE Sensitive     CEFTAZIDIME <=1 SENSITIVE Sensitive     CEFTRIAXONE <=1 SENSITIVE Sensitive     CIPROFLOXACIN >=4 RESISTANT Resistant     GENTAMICIN <=1 SENSITIVE Sensitive     IMIPENEM 4 SENSITIVE Sensitive     TRIMETH/SULFA >=320 RESISTANT Resistant     AMPICILLIN/SULBACTAM <=2  SENSITIVE Sensitive     PIP/TAZO <=4 SENSITIVE Sensitive     * PROTEUS MIRABILIS  Blood Culture ID Panel (Reflexed)     Status: Abnormal   Collection Time: 11/06/18  5:10 PM  Result Value Ref Range Status   Enterococcus species NOT DETECTED NOT DETECTED Final   Listeria monocytogenes NOT DETECTED NOT DETECTED Final   Staphylococcus species NOT DETECTED NOT DETECTED Final   Staphylococcus aureus (BCID) NOT DETECTED NOT DETECTED Final   Streptococcus species NOT DETECTED NOT DETECTED Final   Streptococcus agalactiae NOT DETECTED NOT DETECTED Final   Streptococcus pneumoniae NOT DETECTED NOT DETECTED Final   Streptococcus pyogenes NOT DETECTED NOT DETECTED Final   Acinetobacter baumannii NOT DETECTED NOT DETECTED Final   Enterobacteriaceae species DETECTED (A) NOT DETECTED Final    Comment: Enterobacteriaceae represent a large family of gram-negative bacteria, not a single organism. CRITICAL RESULT CALLED TO, READ BACK BY AND VERIFIED WITH: Melodye Ped [facility] 11/07/18 1837 JDW    Enterobacter cloacae complex NOT DETECTED NOT DETECTED Final   Escherichia coli NOT DETECTED NOT DETECTED Final   Klebsiella oxytoca NOT DETECTED NOT DETECTED Final   Klebsiella pneumoniae NOT DETECTED NOT DETECTED Final   Proteus species DETECTED (A) NOT DETECTED Final    Comment: CRITICAL RESULT CALLED TO, READ BACK BY AND VERIFIED WITH: J GADHIA PHARMD 11/07/18 1837 JDW    Serratia marcescens NOT DETECTED NOT DETECTED Final   Carbapenem resistance NOT DETECTED NOT DETECTED Final   Haemophilus influenzae NOT DETECTED NOT DETECTED Final   Neisseria meningitidis NOT DETECTED NOT DETECTED Final   Pseudomonas aeruginosa NOT DETECTED NOT DETECTED Final   Candida albicans NOT DETECTED NOT DETECTED Final   Candida glabrata NOT DETECTED NOT DETECTED Final   Candida krusei NOT DETECTED NOT DETECTED Final   Candida parapsilosis NOT DETECTED NOT DETECTED Final   Candida tropicalis NOT DETECTED NOT DETECTED Final     Comment: Performed at [facility], [place] [address], [place], [place] [address]  Urine culture     Status: Abnormal   Collection Time: 11/06/18 10:14 PM  Result Value Ref Range Status   Specimen Description   Final    URINE, RANDOM Performed at [facility] [address] [place], [place] [address]    Special Requests   Final    NONE Performed at [facility], [place] [address] [place], [place] [address]    Culture >=100,000 COLONIES/mL PROTEUS MIRABILIS (A)  Final   Report Status 11/09/2018  FINAL  Final   Organism ID, Bacteria PROTEUS MIRABILIS (A)  Final      Susceptibility   Proteus mirabilis - MIC*    AMPICILLIN >=32 RESISTANT Resistant     CEFAZOLIN <=4 SENSITIVE Sensitive     CEFTRIAXONE <=1 SENSITIVE Sensitive     CIPROFLOXACIN >=4 RESISTANT Resistant     GENTAMICIN <=1 SENSITIVE Sensitive     IMIPENEM 4 SENSITIVE Sensitive     NITROFURANTOIN 128 RESISTANT Resistant     TRIMETH/SULFA >=320 RESISTANT Resistant     AMPICILLIN/SULBACTAM <=2 SENSITIVE Sensitive     PIP/TAZO <=4 SENSITIVE Sensitive     * >=100,000 COLONIES/mL PROTEUS MIRABILIS  MRSA PCR Screening     Status: None   Collection Time: 11/07/18 12:32 AM  Result Value Ref Range Status   MRSA by PCR NEGATIVE NEGATIVE Final    Comment:        The GeneXpert MRSA Assay (FDA approved for NASAL specimens only), is one component of a comprehensive MRSA colonization surveillance program. It is not intended to diagnose MRSA infection nor to guide or monitor treatment for MRSA infections. Performed at [facility], [place] [address], [place], [place] [address]   Blood culture (routine x 2)     Status: None (Preliminary result)   Collection Time: 11/07/18  1:23 AM  Result Value Ref Range Status   Specimen Description   Final    BLOOD LEFT ANTECUBITAL Performed at [facility] [address] [place], [place] [address]    Special  Requests   Final    BOTTLES DRAWN AEROBIC ONLY Blood Culture adequate volume Performed at [place] [address] [place], [place] [address]    Culture   Final    NO GROWTH 2 DAYS Performed at [place] [address] [place], [place] [address]    Report Status PENDING  Incomplete         Radiology Studies: Ir Catheter Tube Change  Result Date: 11/08/2018 INDICATION: 83 year old male admitted with urinary tract infection. He has a history of bilateral ureteral obstruction secondary to bladder carcinoma. He has existing nephroureteral tubes. Percutaneous suprapubic catheter was placed 08/19/2018 for outlet obstruction. EXAM: IMAGE GUIDED EXCHANGE OF BILATERAL PERCUTANEOUS NEPHROURETERAL CATHETER IMAGE GUIDED EXCHANGE OF SUPRAPUBIC DRAINAGE CATHETER COMPARISON:  09/20/2018, 08/19/2018, 08/16/2018, 08/09/2018 MEDICATIONS: None ANESTHESIA/SEDATION: Fentanyl 300 mcg IV; Versed 6 mg IV Moderate Sedation Time:  45 minutes The patient was continuously monitored during the procedure by the interventional radiology nurse under my direct supervision. CONTRAST:  56mL ISOVUE-300 IOPAMIDOL (ISOVUE-300) INJECTION 61%, 54mL ISOVUE-300 IOPAMIDOL (ISOVUE-300) INJECTION 61% - administered into the collecting system(s) FLUOROSCOPY TIME:  Fluoroscopy Time: 4 minutes 0 seconds (67 mGy). COMPLICATIONS: None PROCEDURE: Informed written consent was obtained from the patient after a thorough discussion of the procedural risks, benefits and alternatives. All questions were addressed. Maximal Sterile Barrier Technique was utilized including caps, mask, sterile gowns, sterile gloves, sterile drape, hand hygiene and skin antiseptic. A timeout was performed prior to the initiation of the procedure. Patient position prone position. The bilateral indwelling percutaneous nephroureteral tubes were prepped and draped in the usual sterile fashion. We addressed the left first. 1% lidocaine was used  for local anesthesia. Contrast was infused confirming location. The catheter was ligated and the catheter was removed on a Bentson wire. With the Bentson wire in the ureter, a new 24 cm ten French percutaneous nephroureteral catheter was advanced. Loop was formed in the urinary bladder and  in the pelvis of the kidney. Contrast injection confirmed location. Suture was placed as a retention suture. We then addressed the right. 1% lidocaine was used for local anesthesia. Contrast was infused confirming location. Catheter was ligated and the catheter was attempted to be removed on a Bentson wire. The Bentson wire would not pass through the distal end of the catheter. Bentson wire was removed and a exchange length Glidewire was used. Glidewire passed into the ureter and the catheter was removed from the [place]. A new 22 cm 10 French percutaneous nephroureteral catheter was advanced. Loop was formed in the urinary bladder and in the pelvis of the kidney. Contrast confirmed location. A suture was placed for retention suture. The patient was then moved into a supine position. The suprapubic catheter and the skin and subcutaneous tissues were prepped and draped in the usual sterile fashion. 1% lidocaine was used for local anesthesia. Bentson wire is passed in the urinary bladder under imaging. Catheter was removed. 43 [place] dilator was placed. A new 16 French pigtail catheters placed into the urinary bladder with loop formed in the urinary bladder. Contrast confirmed location. Retention suture was placed. Patient tolerated the procedure well and remained hemodynamically stable throughout. No complications were encountered and no significant blood loss. IMPRESSION: Status post routine exchange of bilateral percutaneous nephroureteral tubes with left 24 cm drainage catheter and right 22 cm drainage catheter. Suprapubic catheter exchange was also performed with up sizing to a 16 French pigtail catheter. Signed, Dulcy Fanny. Dellia Nims, RPVI [facility] Radiology Electronically Signed   By: Corrie Mckusick D.O.   On: 11/08/2018 17:18   Ir Ext Nephroureteral Cath Exchange  Result Date: 11/08/2018 INDICATION: 83 year old male admitted with urinary tract infection. He has a history of bilateral ureteral obstruction secondary to bladder carcinoma. He has existing nephroureteral tubes. Percutaneous suprapubic catheter was placed 08/19/2018 for outlet obstruction. EXAM: IMAGE GUIDED EXCHANGE OF BILATERAL PERCUTANEOUS NEPHROURETERAL CATHETER IMAGE GUIDED EXCHANGE OF SUPRAPUBIC DRAINAGE CATHETER COMPARISON:  09/20/2018, 08/19/2018, 08/16/2018, 08/09/2018 MEDICATIONS: None ANESTHESIA/SEDATION: Fentanyl 300 mcg IV; Versed 6 mg IV Moderate Sedation Time:  45 minutes The patient was continuously monitored during the procedure by the interventional radiology nurse under my direct supervision. CONTRAST:  29mL ISOVUE-300 IOPAMIDOL (ISOVUE-300) INJECTION 61%, 14mL ISOVUE-300 IOPAMIDOL (ISOVUE-300) INJECTION 61% - administered into the collecting system(s) FLUOROSCOPY TIME:  Fluoroscopy Time: 4 minutes 0 seconds (67 mGy). COMPLICATIONS: None PROCEDURE: Informed written consent was obtained from the patient after a thorough discussion of the procedural risks, benefits and alternatives. All questions were addressed. Maximal Sterile Barrier Technique was utilized including caps, mask, sterile gowns, sterile gloves, sterile drape, hand hygiene and skin antiseptic. A timeout was performed prior to the initiation of the procedure. Patient position prone position. The bilateral indwelling percutaneous nephroureteral tubes were prepped and draped in the usual sterile fashion. We addressed the left first. 1% lidocaine was used for local anesthesia. Contrast was infused confirming location. The catheter was ligated and the catheter was removed on a Bentson wire. With the Bentson wire in the ureter, a new 24  cm ten French percutaneous nephroureteral catheter was advanced. Loop was formed in the urinary bladder and in the pelvis of the kidney. Contrast injection confirmed location. Suture was placed as a retention suture. We then addressed the right. 1% lidocaine was used for local anesthesia. Contrast was infused confirming location. Catheter was ligated and the catheter was attempted to be removed on a Bentson wire. The Bentson wire would  not pass through the distal end of the catheter. Bentson wire was removed and a exchange length Glidewire was used. Glidewire passed into the ureter and the catheter was removed from the [place]. A new 22 cm 10 French percutaneous nephroureteral catheter was advanced. Loop was formed in the urinary bladder and in the pelvis of the kidney. Contrast confirmed location. A suture was placed for retention suture. The patient was then moved into a supine position. The suprapubic catheter and the skin and subcutaneous tissues were prepped and draped in the usual sterile fashion. 1% lidocaine was used for local anesthesia. Bentson wire is passed in the urinary bladder under imaging. Catheter was removed. 61 [place] dilator was placed. A new 16 French pigtail catheters placed into the urinary bladder with loop formed in the urinary bladder. Contrast confirmed location. Retention suture was placed. Patient tolerated the procedure well and remained hemodynamically stable throughout. No complications were encountered and no significant blood loss. IMPRESSION: Status post routine exchange of bilateral percutaneous nephroureteral tubes with left 24 cm drainage catheter and right 22 cm drainage catheter. Suprapubic catheter exchange was also performed with up sizing to a 16 French pigtail catheter. Signed, Dulcy Fanny. Dellia Nims, RPVI [facility] Inter[facility] Radiology Specialists [facility] Radiology Electronically Signed   By: Corrie Mckusick D.O.   On: 11/08/2018 17:18   Ir Ext  Nephroureteral Cath Exchange  Result Date: 11/08/2018 INDICATION: 83 year old male admitted with urinary tract infection. He has a history of bilateral ureteral obstruction secondary to bladder carcinoma. He has existing nephroureteral tubes. Percutaneous suprapubic catheter was placed 08/19/2018 for outlet obstruction. EXAM: IMAGE GUIDED EXCHANGE OF BILATERAL PERCUTANEOUS NEPHROURETERAL CATHETER IMAGE GUIDED EXCHANGE OF SUPRAPUBIC DRAINAGE CATHETER COMPARISON:  09/20/2018, 08/19/2018, 08/16/2018, 08/09/2018 MEDICATIONS: None ANESTHESIA/SEDATION: Fentanyl 300 mcg IV; Versed 6 mg IV Moderate Sedation Time:  45 minutes The patient was continuously monitored during the procedure by the interventional radiology nurse under my direct supervision. CONTRAST:  49mL ISOVUE-300 IOPAMIDOL (ISOVUE-300) INJECTION 61%, 81mL ISOVUE-300 IOPAMIDOL (ISOVUE-300) INJECTION 61% - administered into the collecting system(s) FLUOROSCOPY TIME:  Fluoroscopy Time: 4 minutes 0 seconds (67 mGy). COMPLICATIONS: None PROCEDURE: Informed written consent was obtained from the patient after a thorough discussion of the procedural risks, benefits and alternatives. All questions were addressed. Maximal Sterile Barrier Technique was utilized including caps, mask, sterile gowns, sterile gloves, sterile drape, hand hygiene and skin antiseptic. A timeout was performed prior to the initiation of the procedure. Patient position prone position. The bilateral indwelling percutaneous nephroureteral tubes were prepped and draped in the usual sterile fashion. We addressed the left first. 1% lidocaine was used for local anesthesia. Contrast was infused confirming location. The catheter was ligated and the catheter was removed on a Bentson wire. With the Bentson wire in the ureter, a new 24 cm ten French percutaneous nephroureteral catheter was advanced. Loop was formed in the urinary bladder and in the pelvis of the kidney. Contrast injection confirmed  location. Suture was placed as a retention suture. We then addressed the right. 1% lidocaine was used for local anesthesia. Contrast was infused confirming location. Catheter was ligated and the catheter was attempted to be removed on a Bentson wire. The Bentson wire would not pass through the distal end of the catheter. Bentson wire was removed and a exchange length Glidewire was used. Glidewire passed into the ureter and the catheter was removed from the [place]. A new 22 cm 10 French percutaneous nephroureteral catheter was advanced. Loop was formed in the urinary bladder and in  the pelvis of the kidney. Contrast confirmed location. A suture was placed for retention suture. The patient was then moved into a supine position. The suprapubic catheter and the skin and subcutaneous tissues were prepped and draped in the usual sterile fashion. 1% lidocaine was used for local anesthesia. Bentson wire is passed in the urinary bladder under imaging. Catheter was removed. 28 [place] dilator was placed. A new 16 French pigtail catheters placed into the urinary bladder with loop formed in the urinary bladder. Contrast confirmed location. Retention suture was placed. Patient tolerated the procedure well and remained hemodynamically stable throughout. No complications were encountered and no significant blood loss. IMPRESSION: Status post routine exchange of bilateral percutaneous nephroureteral tubes with left 24 cm drainage catheter and right 22 cm drainage catheter. Suprapubic catheter exchange was also performed with up sizing to a 16 French pigtail catheter. Signed, Dulcy Fanny. Dellia Nims, RPVI Vascular and Interventional Radiology Specialists [facility] Radiology Electronically Signed   By: Corrie Mckusick D.O.   On: 11/08/2018 17:18        Scheduled Meds: . amoxicillin-clavulanate  1 tablet Oral BID WC  . feeding supplement  1 Container Oral TID BM  . OLANZapine zydis  5 mg Oral QHS  . pramipexole  1.5 mg  Oral q1800  . sodium chloride flush  5 mL Intracatheter Q8H   Continuous Infusions: . sodium chloride 10 mL/hr at 11/09/18 1422     LOS: 3 days    Time spent: 40 minutes    Irine Seal, MD [facility] Pager [phone]  If 7PM-7AM, please contact night-coverage [email or web address] Password [facility] 11/09/2018, 5:32 PM

## 2018-11-09 NOTE — Care Management Important Message (Signed)
Important Message  Patient Details  Name: JAMELLE GOLDSTON MRN: [id number] Date of Birth: [birth year]   Medicare Important Message Given:  Yes    Kerin Salen 11/09/2018, 11:25 AMImportant Message  Patient Details  Name: KAHLEB MCCLANE MRN: [id number] Date of Birth: Feb 27, 1935   Medicare Important Message Given:  Yes    Kerin Salen 11/09/2018, 11:25 AM

## 2018-11-09 NOTE — Evaluation (Signed)
Physical Therapy Evaluation Patient Details Name: Charles Coffey MRN: [id number] DOB: 03-01-1935 Today's Date: 11/09/2018   History of Present Illness  Pt is an 83 y.o. male with PMHx significant for CAD s/p CABG 2002, CHF EF 25%, colon cancer s/p obstruction and colostomy, bladder cancer with nephrostomy tubes and SP catheter and admitted for sepsis due to UTI. Pt s/p Routine exchange of bilateral percutaneous nephroureteral tubes and Exchange of supra-pubic catheter on 11/08/18.  Clinical Impression  Pt admitted with above diagnosis. Pt currently with functional limitations due to the deficits listed below (see PT Problem List).  Pt will benefit from skilled PT to increase their independence and safety with mobility to allow discharge to the venue listed below.   Pt reports feeling better and able to ambulate in hallway good distance.  Pt hopeful for d/c home tomorrow.     Follow Up Recommendations No PT follow up    Equipment Recommendations  None recommended by PT    Recommendations for Other Services       Precautions / Restrictions Precautions Precautions: Fall      Mobility  Bed Mobility Overal bed mobility: Needs Assistance Bed Mobility: Supine to Sit;Sit to Supine     Supine to sit: Supervision Sit to supine: Supervision   General bed mobility comments: supervision for drains  Transfers Overall transfer level: Needs assistance Equipment used: None Transfers: Sit to/from Stand Sit to Stand: Min guard         General transfer comment: min/guard for safety, no physical assist required  Ambulation/Gait Ambulation/Gait assistance: Min guard Gait Distance (Feet): 220 Feet Assistive device: IV Pole;None Gait Pattern/deviations: Step-through pattern;Decreased stride length     General Gait Details: pt ambulated with IV pole initially however able to ambulate without UE support at least 120 feet; pt denies any symptoms, HR 115 bpm during ambulation  Stairs            Wheelchair Mobility    Modified Rankin (Stroke Patients Only)       Balance Overall balance assessment: Mild deficits observed, not formally tested(denies any hx of falls)                                           Pertinent Vitals/Pain Pain Assessment: No/denies pain    Home Living Family/patient expects to be discharged to:: Private residence Living Arrangements: Spouse/significant other Available Help at Discharge: Family;Available 24 hours/day Type of Home: House Home Access: Level entry     Home Layout: Two level;Able to live on main level with bedroom/bathroom Home Equipment: None      Prior Function Level of Independence: Independent               Hand Dominance        Extremity/Trunk Assessment        Lower Extremity Assessment Lower Extremity Assessment: Generalized weakness       Communication   Communication: No difficulties  Cognition Arousal/Alertness: Awake/alert Behavior During Therapy: WFL for tasks assessed/performed Overall Cognitive Status: Within Functional Limits for tasks assessed                                        General Comments      Exercises     Assessment/Plan    PT Assessment Patient  needs continued PT services  PT Problem List Decreased strength;Decreased activity tolerance;Decreased mobility       PT Treatment Interventions DME instruction;Therapeutic activities;Gait training;Functional mobility training;Therapeutic exercise;Patient/family education;Balance training    PT Goals (Current goals can be found in the Care Plan section)  Acute Rehab PT Goals PT Goal Formulation: With patient Time For Goal Achievement: 11/16/18 Potential to Achieve Goals: Good    Frequency Min 3X/week   Barriers to discharge        Co-evaluation               AM-PAC PT "6 Clicks" Daily Activity  Outcome Measure Difficulty turning over in bed (including adjusting  bedclothes, sheets and blankets)?: None Difficulty moving from lying on back to sitting on the side of the bed? : None Difficulty sitting down on and standing up from a chair with arms (e.g., wheelchair, bedside commode, etc,.)?: A Little Help needed moving to and from a bed to chair (including a wheelchair)?: A Little Help needed walking in hospital room?: A Little Help needed climbing 3-5 steps with a railing? : A Little 6 Click Score: 20    End of Session Equipment Utilized During Treatment: Gait belt Activity Tolerance: Patient tolerated treatment well Patient left: in bed;with bed alarm set;with call bell/phone within reach;with family/visitor present Nurse Communication: Mobility status PT Visit Diagnosis: Difficulty in walking, not elsewhere classified (R26.2)    Time: 6789-3810 PT Time Calculation (min) (ACUTE ONLY): 20 min   Charges:   PT Evaluation $PT Eval Low Complexity: [place], PT, DPT Acute Rehabilitation Services Office: [phone] Pager: [phone]  Trena Platt 11/09/2018, 4:26 PM

## 2018-11-10 DIAGNOSIS — N183 Chronic kidney disease, stage 3 (moderate): Secondary | ICD-10-CM

## 2018-11-10 DIAGNOSIS — K219 Gastro-esophageal reflux disease without esophagitis: Secondary | ICD-10-CM

## 2018-11-10 DIAGNOSIS — T83511A Infection and inflammatory reaction due to indwelling urethral catheter, initial encounter: Secondary | ICD-10-CM

## 2018-11-10 DIAGNOSIS — F331 Major depressive disorder, recurrent, moderate: Secondary | ICD-10-CM

## 2018-11-10 DIAGNOSIS — N390 Urinary tract infection, site not specified: Secondary | ICD-10-CM

## 2018-11-10 LAB — CBC WITH DIFFERENTIAL/PLATELET
Abs Immature Granulocytes: 0.13 10*3/uL — ABNORMAL HIGH (ref 0.00–0.07)
BASOS ABS: 0 10*3/uL (ref 0.0–0.1)
Basophils Relative: 0 %
EOS PCT: 4 %
Eosinophils Absolute: 0.3 10*3/uL (ref 0.0–0.5)
HEMATOCRIT: 25.1 % — AB (ref 39.0–52.0)
HEMOGLOBIN: 8 g/dL — AB (ref 13.0–17.0)
Immature Granulocytes: 2 %
LYMPHS ABS: 1.2 10*3/uL (ref 0.7–4.0)
LYMPHS PCT: 17 %
MCH: 32.1 pg (ref 26.0–34.0)
MCHC: 31.9 g/dL (ref 30.0–36.0)
MCV: 100.8 fL — ABNORMAL HIGH (ref 80.0–100.0)
MONO ABS: 0.5 10*3/uL (ref 0.1–1.0)
Monocytes Relative: 8 %
NEUTROS ABS: 4.7 10*3/uL (ref 1.7–7.7)
Neutrophils Relative %: 69 %
Platelets: 49 10*3/uL — ABNORMAL LOW (ref 150–400)
RBC: 2.49 MIL/uL — AB (ref 4.22–5.81)
RDW: 19.1 % — ABNORMAL HIGH (ref 11.5–15.5)
WBC: 6.8 10*3/uL (ref 4.0–10.5)
nRBC: 0 % (ref 0.0–0.2)

## 2018-11-10 LAB — BASIC METABOLIC PANEL
Anion gap: 10 (ref 5–15)
BUN: 30 mg/dL — ABNORMAL HIGH (ref 8–23)
CHLORIDE: 110 mmol/L (ref 98–111)
CO2: 19 mmol/L — ABNORMAL LOW (ref 22–32)
Calcium: 8.4 mg/dL — ABNORMAL LOW (ref 8.9–10.3)
Creatinine, Ser: 1.81 mg/dL — ABNORMAL HIGH (ref 0.61–1.24)
GFR calc non Af Amer: 33 mL/min — ABNORMAL LOW (ref >60)
GFR, EST AFRICAN AMERICAN: 39 mL/min — AB (ref >60)
Glucose, Bld: 110 mg/dL — ABNORMAL HIGH (ref 70–99)
POTASSIUM: 3.5 mmol/L (ref 3.5–5.1)
SODIUM: 139 mmol/L (ref 135–145)

## 2018-11-10 LAB — MAGNESIUM: MAGNESIUM: 1.8 mg/dL (ref 1.7–2.4)

## 2018-11-10 MED ORDER — FUROSEMIDE 40 MG PO TABS: 40.0000 mg | ORAL_TABLET | Freq: Every day | ORAL | Status: DC | PRN

## 2018-11-10 MED ORDER — AMOXICILLIN-POT CLAVULANATE 875-125 MG PO TABS: 1.0000 | ORAL_TABLET | Freq: Two times a day (BID) | ORAL | 0 refills | Status: AC

## 2018-11-10 MED ORDER — DULOXETINE HCL 20 MG PO CPEP: 20.0000 mg | ORAL_CAPSULE | Freq: Two times a day (BID) | ORAL | Status: DC

## 2018-11-10 MED ORDER — POTASSIUM CHLORIDE CRYS ER 20 MEQ PO TBCR
40.0000 meq | EXTENDED_RELEASE_TABLET | Freq: Once | ORAL | Status: AC
  Administered 2018-11-10: 40 meq via ORAL
  Filled 2018-11-10: qty 2

## 2018-11-10 MED ORDER — OXYCODONE HCL ER 10 MG PO T12A: 10.0000 mg | EXTENDED_RELEASE_TABLET | Freq: Every day | ORAL | Status: DC

## 2018-11-10 MED ORDER — HEPARIN SOD (PORK) LOCK FLUSH 100 UNIT/ML IV SOLN
500.0000 [IU] | INTRAVENOUS | Status: AC | PRN
  Administered 2018-11-10: 500 [IU]

## 2018-11-10 MED ORDER — SODIUM BICARBONATE 650 MG PO TABS
650.0000 mg | ORAL_TABLET | Freq: Once | ORAL | Status: AC
  Administered 2018-11-10: 650 mg via ORAL
  Filled 2018-11-10: qty 1

## 2018-11-10 NOTE — Progress Notes (Signed)
VAST RN to pt's bedside to de-access port for discharge. Pt's wife offered information that upon admission implanted port would not draw back and TPA had to be used. VAST RN educated pt needs to speak with his oncologist at next visit about getting it flushed reguarly to help prevent clotting in the future. Pt and wife appreciative of the information.

## 2018-11-10 NOTE — Discharge Summary (Signed)
Physician Discharge Summary  Charles Coffey JYN:[id number] DOB: 08-Jul-1935 DOA: 11/06/2018  PCP: [name], MD  Admit date: 11/06/2018 Discharge date: 11/10/2018  Time spent: 60 minutes  Recommendations for Outpatient Follow-up:  1. Follow-up with Dr. [name], urology in 2 weeks. 2. Follow-up with [name], MD in 2 to 3 weeks.  On follow-up patient will need a basic metabolic profile done to follow-up on electrolytes and renal function.  Patient has bacteremia and UTI will need to be followed up upon. 3. Follow-up with Dr. Harrell Gave End, cardiology as previously scheduled. 4. Patient will be discharged home with home health therapies.   Discharge Diagnoses:  Principal Problem:   Sepsis ([place]) Active Problems:   Bacterial infection due to Proteus mirabilis   Essential hypertension   GERD   Chronic kidney disease, stage 3 (HCC)   Urothelial carcinoma of bladder (HCC)   Thrombocytopenia (HCC)   Chronic systolic CHF (congestive heart failure) (HCC)   UTI (urinary tract infection)   AKI (acute kidney injury) ([place])   Moderate episode of recurrent major depressive disorder ([place])   Discharge Condition: Stable and improved  Diet recommendation: Heart healthy  Filed Weights   11/06/18 1653 11/07/18 0021  Weight: 81.6 kg 84.7 kg    History of present illness:  Per Dr Humberto Seals is a 83 y.o. male with with CAD s/p CABG and HFrEF (EF25-30%), hiatal hernia, hx of colon cancer, hx of colonic obstruction s/p colostomy and bladder cancer(previouslyon keytruda)  s/p b/l percutaneous nephrostomies and recent conversion to b/l nephroureteral catheters, suprapubic catheter, hx of urosepsis who presented with altered mentation, increasing lower abdominal pain and fevers/chills. Per wife, patient has been having clear adequate urinary output via the suprapubic catheter, although he does have some leakage through his urethra intermittently. No hematuria noted.  On day of  admission, patient's home temp was 101 deg; having severe abdominal pains. Normal stool color and output via colostomy. Wife reports that she had given him leftover abx (PO ciprofloxacin) for the past ~ 2 days because she was concerned for UTI, which had presented similarly in the past with delirium and fevers. Of note, IR had scheduled nephroureteral cath exchange on 11/18.   His prior hospitalization for urosepsis in August 8657 was complicated by severe delirium -- was started on zyprexa which his outpatient PCP has been tapering down (currently on 2.5mg  nightly).   Review of Systems: +fevers/chills, lower abdominal pain;  - no cough - no chest pain, dyspnea on exertion - no edema, PND, orthopnea - no nausea/vomiting; no tarry, melanotic or bloody stools - no weight changes  Rest of systems reviewed are negative, except as per above history.   ED course: Febrile to 103 deg  Vitals Blood pressure 131/71, pulse (!) 105, temperature (!) 103.2 F (39.6 C), temperature source Rectal, resp. rate 18, height 5\' 8"  (1.727 m), weight 81.6 kg, SpO2 90 %. Received alteplase (for clotted port); tylenol 650mg  x1;dilaudid 1mg  IV; ceftriaxone 1gm IM (IM due to port access issues);    Hospital Course:  Sepsis secondary to Proteus bacteremia and Proteus UTI with suprapubic urinary catheter. She was admitted with sepsis with a temperature of 103.2, noted to be tachycardic.  Patient was pancultured and urinalysis done on admission was consistent with a UTI.  Patient was placed empirically on IV cefepime while cultures were pending.  Dr. Loleta Books discussed with Urologyon admission.Nephrostomy tubes to drainage. IR consulted for nephrostomy tube exchange and SP exchange which was done on  11/08/2018 without any complications.  Blood cultures and urine cultures positive for Proteus mirabilis which was resistant to ciprofloxacin, Bactrim.  Sensitive to ampicillin/sulbactam and Zosyn and the cephalosporins.   Patient fever curve trended down and patient was afebrile for 48 to 72 hours prior to discharge.  IV Rocephin was subsequently transitioned to oral Augmentin which patient tolerated.  Patient will be discharged home on 11 more days of oral Augmentin to complete a 2-week course of antibiotic treatment.  Treatment regimen and duration was discussed with ID who was in agreement.  Outpatient follow-up with urology and PCP.    AKI Baseline Cr 1.5-1.7.  Creatinine went up as high as 2.13 and improved and and stabilized at 1.81 by day of discharge.  Patient was also given gentle hydration during the hospitalization.  Nephrostomy tubes were exchanged and were draining well.  Outpatient follow-up.    Acute on chronic macrocytic anemia Stable relative to baseline, hemoglobin stabilized at 8.1.    Thrombocytopenia -Patient with no overt bleeding.  Platelets stable.    Anticoagulation was held.  Chronic systolic CHF EF 43%. No evidence of fluid overload. Patient's Coreg and Lasix were held during the hospitalization and will be resumed on discharge.  Outpatient follow-up.   Acute metabolic encephalopathy Likely secondary to Proteus UTI and Proteus bacteremia.  Patient improved on a daily basis with treatment of Proteus bacteremia and UTI.  Patient was maintained on home regimen of Zyprexa.  Patient Cymbalta was held during the hospitalization and be resumed on discharge.  Outpatient follow-up with PCP.    Hyponatremia Improved with gentle hydration.  Follow.  Restless legs Patient continued on home regimen of Mirapex.  Procedures:  CT renal stone protocol 11/06/2018  Chest x-ray 11/06/2017  Routine exchange bilateral perc nephroureteral tubes/exchange of suprapubic catheter per Dr. Earleen Newport 11/08/2018  Consultations:  Interventional radiology    Discharge Exam: Vitals:   11/10/18 0438 11/10/18 0836  BP: 124/75 128/77  Pulse: 94 92  Resp: 18 18  Temp: 98.1 F (36.7 C) 97.7  F (36.5 C)  SpO2: 94% 99%    General: NAD Cardiovascular: RRR Respiratory: CTAB  Discharge Instructions   Discharge Instructions    Diet - low sodium heart healthy   Complete by:  As directed    Increase activity slowly   Complete by:  As directed      Allergies as of 11/10/2018      Reactions   Statins Other (See Comments)   liver effects   Cyproheptadine Other (See Comments)   Unable to urinate      Medication List    STOP taking these medications   megestrol 40 MG/ML suspension Commonly known as:  MEGACE   ranitidine 300 MG tablet Commonly known as:  ZANTAC     TAKE these medications   amoxicillin-clavulanate 875-125 MG tablet Commonly known as:  AUGMENTIN Take 1 tablet by mouth 2 (two) times daily with a meal for 11 days.   carvedilol 3.125 MG tablet Commonly known as:  COREG Take 1 tablet (3.125 mg total) by mouth 2 (two) times daily. What changed:  Another medication with the same name was removed. Continue taking this medication, and follow the directions you see here.   DULoxetine 20 MG capsule Commonly known as:  CYMBALTA Take 1 capsule (20 mg total) by mouth 2 (two) times daily. Take once daily for 2 weeks then increase to twice daily What changed:  additional instructions   famotidine 20 MG tablet Commonly known as:  PEPCID Take 1 tablet (20 mg total) by mouth 2 (two) times daily.   feeding supplement Liqd Take 1 Container by mouth 2 (two) times daily between meals.   furosemide 40 MG tablet Commonly known as:  LASIX Take 1 tablet (40 mg total) by mouth daily as needed for fluid or edema.   hydrOXYzine 25 MG tablet Commonly known as:  ATARAX/VISTARIL Take 1 tablet (25 mg total) by mouth every 4 (four) hours as needed for itching. What changed:  when to take this   lidocaine-prilocaine cream Commonly known as:  EMLA Apply 1 application topically as needed.   nitroGLYCERIN 0.4 MG SL tablet Commonly known as:  NITROSTAT PLACE 1 TABLET  (0.4 MG TOTAL) UNDER THE TONGUE EVERY 5 (FIVE) MINUTES AS NEEDED.   OLANZapine 5 MG tablet Commonly known as:  ZYPREXA Take 0.5 tablets (2.5 mg total) by mouth at bedtime.   oxyCODONE 10 mg 12 hr tablet Commonly known as:  OXYCONTIN Take 1 tablet (10 mg total) by mouth at bedtime.   oxyCODONE-acetaminophen 10-325 MG tablet Commonly known as:  PERCOCET Take 1 tablet by mouth every 4 (four) hours as needed for pain.   polyvinyl alcohol 1.4 % ophthalmic solution Commonly known as:  LIQUIFILM TEARS Place 1 drop into both eyes 2 (two) times daily as needed (dry eyes).   pramipexole 1 MG tablet Commonly known as:  MIRAPEX TAKE 1 AND 1/2 TABLETS BY MOUTH AT BEDTIME What changed:  See the new instructions.   triamcinolone cream 0.1 % Commonly known as:  KENALOG Apply topically 3 (three) times daily. To affected areas      Allergies  Allergen Reactions  . Statins Other (See Comments)    liver effects  . Cyproheptadine Other (See Comments)    Unable to urinate   Follow-up Information    [facility]   Specialty:  Home Health Services Why:  nurse Contact information: [address] [place] [address] [phone]        [name], MD. Schedule an appointment as soon as possible for a visit in 2 week(s).   Specialty:  Family Medicine Why:  Follow-up in 2 to 3 weeks. Contact information: [place] [address] [phone]        [name], MD .   Specialty:  Cardiology Contact information: [address] [place] [address] [phone]        Irine Seal, MD. Schedule an appointment as soon as possible for a visit in 2 week(s).   Specialty:  Urology Contact information: [place] [address] [phone]            The results of significant diagnostics from this hospitalization (including imaging, microbiology, ancillary and laboratory) are listed  below for reference.    Significant Diagnostic Studies: Ct Abdomen Pelvis Wo Contrast  Result Date: 10/24/2018 CLINICAL DATA:  Bladder cancer diagnosed in November 2018. Chemotherapy completed. Shortness of breath. EXAM: CT CHEST, ABDOMEN AND PELVIS WITHOUT CONTRAST TECHNIQUE: Multidetector CT imaging of the chest, abdomen and pelvis was performed following the standard protocol without IV contrast. COMPARISON:  Prior studies 07/07/2018 and 03/10/2018. FINDINGS: CT CHEST FINDINGS Cardiovascular: Diffuse atherosclerosis of the aorta, great vessels and coronary arteries status post median sternotomy and CABG. Possible aortic valvular calcifications. The heart size is normal. There is no pericardial effusion. Right IJ Port-A-Cath extends to the superior cavoatrial junction. Mediastinum/Nodes: There are no enlarged mediastinal, hilar or  axillary lymph nodes. Hilar assessment is limited by the lack of intravenous contrast, although the hilar contours appear unchanged. The trachea, esophagus and thyroid gland demonstrate no significant findings. Lungs/Pleura: There is no pleural effusion. There is stable chronic lung disease with subpleural reticulation and chronic central airway thickening. Scattered small pulmonary nodules are stable, mostly subpleural/perifissural in location. Largest measures 5 mm in the left lower lobe (image 132/6). No new or enlarging nodules identified. Musculoskeletal/Chest wall: No chest wall mass or suspicious osseous findings. CT ABDOMEN AND PELVIS FINDINGS Hepatobiliary: The liver appears unremarkable as imaged in the noncontrast state. The gallbladder is incompletely distended. No evidence of gallstones, gallbladder wall thickening or biliary dilatation. Pancreas: Unremarkable. No pancreatic ductal dilatation or surrounding inflammatory changes. Spleen: Normal in size without focal abnormality. Adrenals/Urinary Tract: Both adrenal glands appear normal. Interval exchange of the  previously demonstrated bilateral percutaneous nephrostomies for bilateral percutaneous nephroureteral catheters. Both of these appear coiled in the respective renal pelves and bladder. In addition, there is a suprapubic, percutaneous bladder catheter. There is progressive dilatation of the left renal pelvis. Left renal atrophy and bilateral perinephric soft tissue stranding are stable. No evidence of urinary tract calculus. The bladder demonstrates no focal abnormality. Stomach/Bowel: No evidence of bowel wall thickening, distention or surrounding inflammatory change. Diverting transverse colostomy noted with prominent stool in the proximal colon. Vascular/Lymphatic: There are no enlarged abdominal or pelvic lymph nodes. There is a stable peripherally calcified nodule posteriorly in the aortocaval space, measuring 17 mm transverse on image 72/2 and extending 3.0 cm cephalocaudad on coronal image 89/4. There is aortic and branch vessel atherosclerosis. No acute vascular findings apparent on noncontrast imaging. Reproductive: Stable mild enlargement of the prostate gland. Other: Stable small left inguinal hernia containing only fat. No ascites or focal extraluminal fluid collection seen within the peritoneal cavity. Musculoskeletal: No acute or significant osseous findings. Severe degenerative disc disease status post L3-4 PLIF. There is prominent adjacent segment disease at L2-3 and a grade 1 anterolisthesis at L5-S1 secondary to bilateral L5 pars defects. IMPRESSION: 1. No acute findings or explanation for the patient's symptoms on noncontrast imaging. 2. No evidence of metastatic bladder cancer. 3. Stable chronic lung disease with subpleural reticulation and central airway thickening. Scattered small subpleural pulmonary nodules are stable, likely lymph nodes. 4. Interval exchange of bilateral percutaneous nephrostomies for percutaneous nephroureteral catheters which appear well positioned. Bladder is decompressed  by a suprapubic catheter. There is mildly increased fullness of the left renal collecting system without secondary signs of collecting system obstruction. 5. Diffuse Aortic Atherosclerosis (ICD10-I70.0). Electronically Signed   By: Richardean Sale M.D.   On: 10/24/2018 15:39   Ct Chest Wo Contrast  Result Date: 10/24/2018 CLINICAL DATA:  Bladder cancer diagnosed in November 2018. Chemotherapy completed. Shortness of breath. EXAM: CT CHEST, ABDOMEN AND PELVIS WITHOUT CONTRAST TECHNIQUE: Multidetector CT imaging of the chest, abdomen and pelvis was performed following the standard protocol without IV contrast. COMPARISON:  Prior studies 07/07/2018 and 03/10/2018. FINDINGS: CT CHEST FINDINGS Cardiovascular: Diffuse atherosclerosis of the aorta, great vessels and coronary arteries status post median sternotomy and CABG. Possible aortic valvular calcifications. The heart size is normal. There is no pericardial effusion. Right IJ Port-A-Cath extends to the superior cavoatrial junction. Mediastinum/Nodes: There are no enlarged mediastinal, hilar or axillary lymph nodes. Hilar assessment is limited by the lack of intravenous contrast, although the hilar contours appear unchanged. The trachea, esophagus and thyroid gland demonstrate no significant findings. Lungs/Pleura: There is no pleural effusion. There is  stable chronic lung disease with subpleural reticulation and chronic central airway thickening. Scattered small pulmonary nodules are stable, mostly subpleural/perifissural in location. Largest measures 5 mm in the left lower lobe (image 132/6). No new or enlarging nodules identified. Musculoskeletal/Chest wall: No chest wall mass or suspicious osseous findings. CT ABDOMEN AND PELVIS FINDINGS Hepatobiliary: The liver appears unremarkable as imaged in the noncontrast state. The gallbladder is incompletely distended. No evidence of gallstones, gallbladder wall thickening or biliary dilatation. Pancreas: Unremarkable.  No pancreatic ductal dilatation or surrounding inflammatory changes. Spleen: Normal in size without focal abnormality. Adrenals/Urinary Tract: Both adrenal glands appear normal. Interval exchange of the previously demonstrated bilateral percutaneous nephrostomies for bilateral percutaneous nephroureteral catheters. Both of these appear coiled in the respective renal pelves and bladder. In addition, there is a suprapubic, percutaneous bladder catheter. There is progressive dilatation of the left renal pelvis. Left renal atrophy and bilateral perinephric soft tissue stranding are stable. No evidence of urinary tract calculus. The bladder demonstrates no focal abnormality. Stomach/Bowel: No evidence of bowel wall thickening, distention or surrounding inflammatory change. Diverting transverse colostomy noted with prominent stool in the proximal colon. Vascular/Lymphatic: There are no enlarged abdominal or pelvic lymph nodes. There is a stable peripherally calcified nodule posteriorly in the aortocaval space, measuring 17 mm transverse on image 72/2 and extending 3.0 cm cephalocaudad on coronal image 89/4. There is aortic and branch vessel atherosclerosis. No acute vascular findings apparent on noncontrast imaging. Reproductive: Stable mild enlargement of the prostate gland. Other: Stable small left inguinal hernia containing only fat. No ascites or focal extraluminal fluid collection seen within the peritoneal cavity. Musculoskeletal: No acute or significant osseous findings. Severe degenerative disc disease status post L3-4 PLIF. There is prominent adjacent segment disease at L2-3 and a grade 1 anterolisthesis at L5-S1 secondary to bilateral L5 pars defects. IMPRESSION: 1. No acute findings or explanation for the patient's symptoms on noncontrast imaging. 2. No evidence of metastatic bladder cancer. 3. Stable chronic lung disease with subpleural reticulation and central airway thickening. Scattered small subpleural  pulmonary nodules are stable, likely lymph nodes. 4. Interval exchange of bilateral percutaneous nephrostomies for percutaneous nephroureteral catheters which appear well positioned. Bladder is decompressed by a suprapubic catheter. There is mildly increased fullness of the left renal collecting system without secondary signs of collecting system obstruction. 5. Diffuse Aortic Atherosclerosis (ICD10-I70.0). Electronically Signed   By: Richardean Sale M.D.   On: 10/24/2018 15:39   Ir Catheter Tube Change  Result Date: 11/08/2018 INDICATION: 83 year old male admitted with urinary tract infection. He has a history of bilateral ureteral obstruction secondary to bladder carcinoma. He has existing nephroureteral tubes. Percutaneous suprapubic catheter was placed 08/19/2018 for outlet obstruction. EXAM: IMAGE GUIDED EXCHANGE OF BILATERAL PERCUTANEOUS NEPHROURETERAL CATHETER IMAGE GUIDED EXCHANGE OF SUPRAPUBIC DRAINAGE CATHETER COMPARISON:  09/20/2018, 08/19/2018, 08/16/2018, 08/09/2018 MEDICATIONS: None ANESTHESIA/SEDATION: Fentanyl 300 mcg IV; Versed 6 mg IV Moderate Sedation Time:  45 minutes The patient was continuously monitored during the procedure by the interventional radiology nurse under my direct supervision. CONTRAST:  66mL ISOVUE-300 IOPAMIDOL (ISOVUE-300) INJECTION 61%, 22mL ISOVUE-300 IOPAMIDOL (ISOVUE-300) INJECTION 61% - administered into the collecting system(s) FLUOROSCOPY TIME:  Fluoroscopy Time: 4 minutes 0 seconds (67 mGy). COMPLICATIONS: None PROCEDURE: Informed written consent was obtained from the patient after a thorough discussion of the procedural risks, benefits and alternatives. All questions were addressed. Maximal Sterile Barrier Technique was utilized including caps, mask, sterile gowns, sterile gloves, sterile drape, hand hygiene and skin antiseptic. A timeout was performed prior to the  initiation of the procedure. Patient position prone position. The bilateral indwelling percutaneous  nephroureteral tubes were prepped and draped in the usual sterile fashion. We addressed the left first. 1% lidocaine was used for local anesthesia. Contrast was infused confirming location. The catheter was ligated and the catheter was removed on a Bentson wire. With the Bentson wire in the ureter, a new 24 cm ten French percutaneous nephroureteral catheter was advanced. Loop was formed in the urinary bladder and in the pelvis of the kidney. Contrast injection confirmed location. Suture was placed as a retention suture. We then addressed the right. 1% lidocaine was used for local anesthesia. Contrast was infused confirming location. Catheter was ligated and the catheter was attempted to be removed on a Bentson wire. The Bentson wire would not pass through the distal end of the catheter. Bentson wire was removed and a exchange length Glidewire was used. Glidewire passed into the ureter and the catheter was removed from the [place]. A new 22 cm 10 French percutaneous nephroureteral catheter was advanced. Loop was formed in the urinary bladder and in the pelvis of the kidney. Contrast confirmed location. A suture was placed for retention suture. The patient was then moved into a supine position. The suprapubic catheter and the skin and subcutaneous tissues were prepped and draped in the usual sterile fashion. 1% lidocaine was used for local anesthesia. Bentson wire is passed in the urinary bladder under imaging. Catheter was removed. 95 [place] dilator was placed. A new 16 French pigtail catheters placed into the urinary bladder with loop formed in the urinary bladder. Contrast confirmed location. Retention suture was placed. Patient tolerated the procedure well and remained hemodynamically stable throughout. No complications were encountered and no significant blood loss. IMPRESSION: Status post routine exchange of bilateral percutaneous nephroureteral tubes with left 24 cm drainage catheter and right 22 cm  drainage catheter. Suprapubic catheter exchange was also performed with up sizing to a 16 French pigtail catheter. Signed, Dulcy Fanny. Dellia Nims, RPVI [facility] Electronically Signed   By: Corrie Mckusick D.O.   On: 11/08/2018 17:18   Dg Chest Portable 1 View  Result Date: 11/06/2018 CLINICAL DATA:  Altered mental status.  Fever. EXAM: PORTABLE CHEST 1 VIEW COMPARISON:  October 10, 2018 FINDINGS: The right Port-A-Cath is stable. Cardiomegaly is similar in the me interval. The hila and mediastinum are unchanged. No pneumothorax. No other acute abnormalities. IMPRESSION: Stable Port-A-Cath.  No cause for fever noted. Electronically Signed   By: Dorise Bullion III M.D   On: 11/06/2018 23:12   Ct Renal Stone Study  Result Date: 11/06/2018 CLINICAL DATA:  83 year old male with history of abdominal pain, fever and bladder spasms. Recurrent urinary tract infections. History of bladder cancer. EXAM: CT ABDOMEN AND PELVIS WITHOUT CONTRAST TECHNIQUE: Multidetector CT imaging of the abdomen and pelvis was performed following the standard protocol without IV contrast. COMPARISON:  CT the abdomen and pelvis 10/24/2018. FINDINGS: Lower chest: Atherosclerotic calcifications in the right coronary artery. Small right and trace left pleural effusions lying dependently. Hepatobiliary: No definite suspicious cystic or solid hepatic lesions are confidently identified on today's noncontrast CT examination. Unenhanced appearance of the gallbladder is normal. Pancreas: No definite pancreatic mass or peripancreatic fluid or inflammatory changes noted on today's noncontrast CT examination. Spleen: Unremarkable. Adrenals/Urinary Tract: Bilateral nephrostomy tubes in position, with extension catheters extending into the urinary bladder. Suprapubic catheter extending into the urinary bladder. Left extrarenal pelvis. Bilateral adrenal glands are normal in appearance. Other  than  the indwelling catheters, urinary bladder is otherwise normal in appearance. Stomach/Bowel: Unenhanced appearance of the stomach is normal. No pathologic dilatation of small bowel or colon. Numerous colonic diverticulae are noted, without surrounding inflammatory changes to suggest an acute diverticulitis at this time. Left upper quadrant colostomy. Normal appendix. Vascular/Lymphatic: Aortic atherosclerosis. No lymphadenopathy noted in the abdomen or pelvis. Reproductive: Prostate gland and seminal vesicles are unremarkable in appearance. Other: Trace volume of ascites.  No pneumoperitoneum. Musculoskeletal: Status post PLIF at L3-L4. There are no aggressive appearing lytic or blastic lesions noted in the visualized portions of the skeleton. IMPRESSION: 1. No acute findings are noted in the abdomen or pelvis to account for the patient's symptoms. 2. Colonic diverticulosis without evidence of acute diverticulitis at this time. 3. Small right and trace left pleural effusions lying dependently. 4. Aortic atherosclerosis, in addition to at least right coronary artery disease. 5. Additional incidental findings, similar prior study, as above. Electronically Signed   By: Vinnie Langton M.D.   On: 11/06/2018 18:39   Ir Ext Nephroureteral Cath Exchange  Result Date: 11/08/2018 INDICATION: 83 year old male admitted with urinary tract infection. He has a history of bilateral ureteral obstruction secondary to bladder carcinoma. He has existing nephroureteral tubes. Percutaneous suprapubic catheter was placed 08/19/2018 for outlet obstruction. EXAM: IMAGE GUIDED EXCHANGE OF BILATERAL PERCUTANEOUS NEPHROURETERAL CATHETER IMAGE GUIDED EXCHANGE OF SUPRAPUBIC DRAINAGE CATHETER COMPARISON:  09/20/2018, 08/19/2018, 08/16/2018, 08/09/2018 MEDICATIONS: None ANESTHESIA/SEDATION: Fentanyl 300 mcg IV; Versed 6 mg IV Moderate Sedation Time:  45 minutes The patient was continuously monitored during the procedure by the interventional  radiology nurse under my direct supervision. CONTRAST:  33mL ISOVUE-300 IOPAMIDOL (ISOVUE-300) INJECTION 61%, 59mL ISOVUE-300 IOPAMIDOL (ISOVUE-300) INJECTION 61% - administered into the collecting system(s) FLUOROSCOPY TIME:  Fluoroscopy Time: 4 minutes 0 seconds (67 mGy). COMPLICATIONS: None PROCEDURE: Informed written consent was obtained from the patient after a thorough discussion of the procedural risks, benefits and alternatives. All questions were addressed. Maximal Sterile Barrier Technique was utilized including caps, mask, sterile gowns, sterile gloves, sterile drape, hand hygiene and skin antiseptic. A timeout was performed prior to the initiation of the procedure. Patient position prone position. The bilateral indwelling percutaneous nephroureteral tubes were prepped and draped in the usual sterile fashion. We addressed the left first. 1% lidocaine was used for local anesthesia. Contrast was infused confirming location. The catheter was ligated and the catheter was removed on a Bentson wire. With the Bentson wire in the ureter, a new 24 cm ten French percutaneous nephroureteral catheter was advanced. Loop was formed in the urinary bladder and in the pelvis of the kidney. Contrast injection confirmed location. Suture was placed as a retention suture. We then addressed the right. 1% lidocaine was used for local anesthesia. Contrast was infused confirming location. Catheter was ligated and the catheter was attempted to be removed on a Bentson wire. The Bentson wire would not pass through the distal end of the catheter. Bentson wire was removed and a exchange length Glidewire was used. Glidewire passed into the ureter and the catheter was removed from the [place]. A new 22 cm 10 French percutaneous nephroureteral catheter was advanced. Loop was formed in the urinary bladder and in the pelvis of the kidney. Contrast confirmed location. A suture was placed for retention suture. The patient was then moved  into a supine position. The suprapubic catheter and the skin and subcutaneous tissues were prepped and draped in the usual sterile fashion. 1% lidocaine was used for local anesthesia. Bentson wire  is passed in the urinary bladder under imaging. Catheter was removed. 63 [place] dilator was placed. A new 16 French pigtail catheters placed into the urinary bladder with loop formed in the urinary bladder. Contrast confirmed location. Retention suture was placed. Patient tolerated the procedure well and remained hemodynamically stable throughout. No complications were encountered and no significant blood loss. IMPRESSION: Status post routine exchange of bilateral percutaneous nephroureteral tubes with left 24 cm drainage catheter and right 22 cm drainage catheter. Suprapubic catheter exchange was also performed with up sizing to a 16 French pigtail catheter. Signed, Dulcy Fanny. Dellia Nims, RPVI [facility] Radiology Electronically Signed   By: Corrie Mckusick D.O.   On: 11/08/2018 17:18   Ir Ext Nephroureteral Cath Exchange  Result Date: 11/08/2018 INDICATION: 83 year old male admitted with urinary tract infection. He has a history of bilateral ureteral obstruction secondary to bladder carcinoma. He has existing nephroureteral tubes. Percutaneous suprapubic catheter was placed 08/19/2018 for outlet obstruction. EXAM: IMAGE GUIDED EXCHANGE OF BILATERAL PERCUTANEOUS NEPHROURETERAL CATHETER IMAGE GUIDED EXCHANGE OF SUPRAPUBIC DRAINAGE CATHETER COMPARISON:  09/20/2018, 08/19/2018, 08/16/2018, 08/09/2018 MEDICATIONS: None ANESTHESIA/SEDATION: Fentanyl 300 mcg IV; Versed 6 mg IV Moderate Sedation Time:  45 minutes The patient was continuously monitored during the procedure by the interventional radiology nurse under my direct supervision. CONTRAST:  30mL ISOVUE-300 IOPAMIDOL (ISOVUE-300) INJECTION 61%, 26mL ISOVUE-300 IOPAMIDOL (ISOVUE-300) INJECTION 61% - administered into  the collecting system(s) FLUOROSCOPY TIME:  Fluoroscopy Time: 4 minutes 0 seconds (67 mGy). COMPLICATIONS: None PROCEDURE: Informed written consent was obtained from the patient after a thorough discussion of the procedural risks, benefits and alternatives. All questions were addressed. Maximal Sterile Barrier Technique was utilized including caps, mask, sterile gowns, sterile gloves, sterile drape, hand hygiene and skin antiseptic. A timeout was performed prior to the initiation of the procedure. Patient position prone position. The bilateral indwelling percutaneous nephroureteral tubes were prepped and draped in the usual sterile fashion. We addressed the left first. 1% lidocaine was used for local anesthesia. Contrast was infused confirming location. The catheter was ligated and the catheter was removed on a Bentson wire. With the Bentson wire in the ureter, a new 24 cm ten French percutaneous nephroureteral catheter was advanced. Loop was formed in the urinary bladder and in the pelvis of the kidney. Contrast injection confirmed location. Suture was placed as a retention suture. We then addressed the right. 1% lidocaine was used for local anesthesia. Contrast was infused confirming location. Catheter was ligated and the catheter was attempted to be removed on a Bentson wire. The Bentson wire would not pass through the distal end of the catheter. Bentson wire was removed and a exchange length Glidewire was used. Glidewire passed into the ureter and the catheter was removed from the [place]. A new 22 cm 10 French percutaneous nephroureteral catheter was advanced. Loop was formed in the urinary bladder and in the pelvis of the kidney. Contrast confirmed location. A suture was placed for retention suture. The patient was then moved into a supine position. The suprapubic catheter and the skin and subcutaneous tissues were prepped and draped in the usual sterile fashion. 1% lidocaine was used for local anesthesia.  Bentson wire is passed in the urinary bladder under imaging. Catheter was removed. 15 [place] dilator was placed. A new 16 French pigtail catheters placed into the urinary bladder with loop formed in the urinary bladder. Contrast confirmed location. Retention suture was placed. Patient tolerated the procedure well and remained hemodynamically stable throughout. No complications  were encountered and no significant blood loss. IMPRESSION: Status post routine exchange of bilateral percutaneous nephroureteral tubes with left 24 cm drainage catheter and right 22 cm drainage catheter. Suprapubic catheter exchange was also performed with up sizing to a 16 French pigtail catheter. Signed, Dulcy Fanny. Dellia Nims, RPVI [facility] Radiology Electronically Signed   By: Corrie Mckusick D.O.   On: 11/08/2018 17:18    Microbiology: Recent Results (from the past 240 hour(s))  Blood culture (routine x 2)     Status: Abnormal   Collection Time: 11/06/18  5:10 PM  Result Value Ref Range Status   Specimen Description   Final    BLOOD RIGHT ANTECUBITAL Performed at [facility] [address] [place], [place] [address]    Special Requests   Final    BOTTLES DRAWN AEROBIC AND ANAEROBIC Blood Culture results may not be optimal due to an excessive volume of blood received in culture bottles Performed at [facility] [place] [address], [place], [place] [address]    Culture  Setup Time   Final    GRAM NEGATIVE RODS CRITICAL RESULT CALLED TO, READ BACK BY AND VERIFIED WITH: Melodye Ped [facility] 11/07/18 1837 JDW IN BOTH AEROBIC AND ANAEROBIC BOTTLES Performed at [facility], [place] [address], [place], [place] [address]    Culture PROTEUS MIRABILIS (A)  Final   Report Status 11/09/2018 FINAL  Final   Organism ID, Bacteria PROTEUS MIRABILIS  Final      Susceptibility   Proteus mirabilis - MIC*    AMPICILLIN >=32 RESISTANT Resistant      CEFAZOLIN <=4 SENSITIVE Sensitive     CEFEPIME <=1 SENSITIVE Sensitive     CEFTAZIDIME <=1 SENSITIVE Sensitive     CEFTRIAXONE <=1 SENSITIVE Sensitive     CIPROFLOXACIN >=4 RESISTANT Resistant     GENTAMICIN <=1 SENSITIVE Sensitive     IMIPENEM 4 SENSITIVE Sensitive     TRIMETH/SULFA >=320 RESISTANT Resistant     AMPICILLIN/SULBACTAM <=2 SENSITIVE Sensitive     PIP/TAZO <=4 SENSITIVE Sensitive     * PROTEUS MIRABILIS  Blood Culture ID Panel (Reflexed)     Status: Abnormal   Collection Time: 11/06/18  5:10 PM  Result Value Ref Range Status   Enterococcus species NOT DETECTED NOT DETECTED Final   Listeria monocytogenes NOT DETECTED NOT DETECTED Final   Staphylococcus species NOT DETECTED NOT DETECTED Final   Staphylococcus aureus (BCID) NOT DETECTED NOT DETECTED Final   Streptococcus species NOT DETECTED NOT DETECTED Final   Streptococcus agalactiae NOT DETECTED NOT DETECTED Final   Streptococcus pneumoniae NOT DETECTED NOT DETECTED Final   Streptococcus pyogenes NOT DETECTED NOT DETECTED Final   Acinetobacter baumannii NOT DETECTED NOT DETECTED Final   Enterobacteriaceae species DETECTED (A) NOT DETECTED Final    Comment: Enterobacteriaceae represent a large family of gram-negative bacteria, not a single organism. CRITICAL RESULT CALLED TO, READ BACK BY AND VERIFIED WITH: Melodye Ped [facility] 11/07/18 1837 JDW    Enterobacter cloacae complex NOT DETECTED NOT DETECTED Final   Escherichia coli NOT DETECTED NOT DETECTED Final   Klebsiella oxytoca NOT DETECTED NOT DETECTED Final   Klebsiella pneumoniae NOT DETECTED NOT DETECTED Final   Proteus species DETECTED (A) NOT DETECTED Final    Comment: CRITICAL RESULT CALLED TO, READ BACK BY AND VERIFIED WITH: J GADHIA [facility] 11/07/18 1837 JDW    Serratia marcescens NOT DETECTED NOT DETECTED Final   Carbapenem resistance NOT DETECTED NOT DETECTED Final  Haemophilus influenzae NOT DETECTED NOT DETECTED Final   Neisseria meningitidis NOT  DETECTED NOT DETECTED Final   Pseudomonas aeruginosa NOT DETECTED NOT DETECTED Final   Candida albicans NOT DETECTED NOT DETECTED Final   Candida glabrata NOT DETECTED NOT DETECTED Final   Candida krusei NOT DETECTED NOT DETECTED Final   Candida parapsilosis NOT DETECTED NOT DETECTED Final   Candida tropicalis NOT DETECTED NOT DETECTED Final    Comment: Performed at [facility], [place] [address] [place], [place] [address]  Urine culture     Status: Abnormal   Collection Time: 11/06/18 10:14 PM  Result Value Ref Range Status   Specimen Description   Final    URINE, RANDOM Performed at [facility] [address] [place], [place] [address]    Special Requests   Final    NONE Performed at [facility], [place] [address], [place], [place] [address]    Culture >=100,000 COLONIES/mL PROTEUS MIRABILIS (A)  Final   Report Status 11/09/2018 FINAL  Final   Organism ID, Bacteria PROTEUS MIRABILIS (A)  Final      Susceptibility   Proteus mirabilis - MIC*    AMPICILLIN >=32 RESISTANT Resistant     CEFAZOLIN <=4 SENSITIVE Sensitive     CEFTRIAXONE <=1 SENSITIVE Sensitive     CIPROFLOXACIN >=4 RESISTANT Resistant     GENTAMICIN <=1 SENSITIVE Sensitive     IMIPENEM 4 SENSITIVE Sensitive     NITROFURANTOIN 128 RESISTANT Resistant     TRIMETH/SULFA >=320 RESISTANT Resistant     AMPICILLIN/SULBACTAM <=2 SENSITIVE Sensitive     PIP/TAZO <=4 SENSITIVE Sensitive     * >=100,000 COLONIES/mL PROTEUS MIRABILIS  MRSA PCR Screening     Status: None   Collection Time: 11/07/18 12:32 AM  Result Value Ref Range Status   MRSA by PCR NEGATIVE NEGATIVE Final    Comment:        The GeneXpert MRSA Assay (FDA approved for NASAL specimens only), is one component of a comprehensive MRSA colonization surveillance program. It is not intended to diagnose MRSA infection nor to guide or monitor treatment for MRSA infections. Performed at [facility], [place] [address] [place], [place] [address]   Blood culture (routine x 2)     Status: None (Preliminary result)   Collection Time: 11/07/18  1:23 AM  Result Value Ref Range Status   Specimen Description   Final    BLOOD LEFT ANTECUBITAL Performed at [facility] [address] [place], [place] [address]    Special Requests   Final    BOTTLES DRAWN AEROBIC ONLY Blood Culture adequate volume Performed at [facility] [address] [place], [place] [address]    Culture   Final    NO GROWTH 3 DAYS Performed at [facility], [place] [address] [place], [place] [address]    Report Status PENDING  Incomplete     Labs: Basic Metabolic Panel: Recent Labs  Lab 11/06/18 1958 11/07/18 0123 11/08/18 0417 11/09/18 0406 11/10/18 0437  NA 133* 133* 136 135 139  K 4.6 4.7 3.9 3.7 3.5  CL 104 107 110 110 110  CO2 20* 17* 18* 18* 19*  GLUCOSE 93 107* 97 144* 110*  BUN 31* 33* 39* 37* 30*  CREATININE 1.86* 1.89* 2.13* 1.88* 1.81*  CALCIUM 9.0 8.4* 8.4* 8.3* 8.4*  MG  --  1.8 1.9 1.9 1.8   Liver Function Tests: Recent Labs  Lab  11/06/18 1958  AST 17  ALT 13  ALKPHOS 58  BILITOT 1.3*  PROT 6.9  ALBUMIN 3.4*   No results for input(s): LIPASE, AMYLASE in the last 168 hours. No results for input(s): AMMONIA in the last 168 hours. CBC: Recent Labs  Lab 11/06/18 1958 11/07/18 0123 11/08/18 0417 11/09/18 0406 11/10/18 0437  WBC 10.0 9.3 10.1 8.6 6.8  NEUTROABS  --  7.5 8.1* 6.6 4.7  HGB 7.9* 7.2* 7.6* 8.1* 8.0*  HCT 25.1* 23.3* 24.2* 25.8* 25.1*  MCV 101.2* 102.2* 101.7* 100.8* 100.8*  PLT 66* 61* 53* 60* 49*   Cardiac Enzymes: No results for input(s): CKTOTAL, CKMB, CKMBINDEX, TROPONINI in the last 168 hours. BNP: BNP (last 3 results) Recent Labs    01/31/18 0153  BNP 1,472.0*    ProBNP (last 3 results) No results for input(s): PROBNP in the last 8760 hours.  CBG: No results for input(s): GLUCAP in the last  168 hours.     Signed:  Irine Seal MD.  [facility] 11/10/2018, 4:26 PM

## 2018-11-10 NOTE — Progress Notes (Signed)
Patient stable at time of discharge. Pt A&Ox4 ambulatory without assistance. Discharge instructions reviewed with Pt &  [name] spouse. Questions, concerns denied. Dressings are CDI, changed today also

## 2018-11-10 NOTE — Consult Note (Addendum)
   [facility] CM Inpatient Consult   11/10/2018  Charles Coffey 1935/09/16 [id number]     Patient screened for potential [facility] Care Management services due to unplanned readmission risk score of 44% (extreme) and multiple hospitalizations.  Went to bedside to speak with [name] and wife about [place] Management services. Mrs. Dubray states that she thinks having home health nursing is enough. States "if he (patient) would stop getting infections he would probably be okay." Mrs. Daily also endorses that she plans on speaking with the oncologist about outpatient palliative care services.  Mrs. Mcclintock states patient has an upcoming oncologist appointment on next Friday.  Both Mr and Mrs. Russ pleasantly decline [facility] Care Management services at this time. Mrs. Pry states " I still have all of your information and your pharmacist helped [place] a lot last time."  Provided contact information to call should they change their mind in the future.  Expressed appreciation of visit.    Marthenia Rolling, MSN-Ed, RN,BSN [facility] Liaison [phone]

## 2018-11-10 NOTE — Care Management Note (Signed)
Case Management Note  Patient Details  Name: Charles Coffey MRN: [id number] Date of Birth: 1935/02/13  Subjective/Objective:      Spoke with patient and spouse at bedside. Discussed HH needs and services ordered. They are agreeable to [facility] but not other services, feel they are not necessary as they are familiar with nephr tubes and care. They have had [facility] in the past and wish to use them again.           Action/Plan: Contacted [facility] for the referral, they have accepted.   Expected Discharge Date:                  Expected Discharge Plan:  [place]  In-House Referral:  NA  Discharge planning Services  CM Consult  Post Acute Care Choice:  Home Health Choice offered to:  Patient  DME Arranged:  N/A DME Agency:  NA  HH Arranged:  RN, Disease Management [place] Agency:  [place]  Status of Service:  Completed, signed off  If discussed at [place] of Stay Meetings, dates discussed:    Additional Comments:  [name], RN 11/10/2018, 10:36 AM

## 2018-11-11 ENCOUNTER — Telehealth: Payer: Self-pay | Admitting: *Deleted

## 2018-11-11 ENCOUNTER — Other Ambulatory Visit: Payer: Self-pay | Admitting: Family Medicine

## 2018-11-11 NOTE — Telephone Encounter (Signed)
PCP: [name], MD  Admit date: 11/06/2018 Discharge date: 11/10/2018  Time spent: 60 minutes  Recommendations for Outpatient Follow-up:  1. Follow-up with Dr. [name], urology in 2 weeks. 2. Follow-up with [name], MD in 2 to 3 weeks.  On follow-up patient will need a basic metabolic profile done to follow-up on electrolytes and renal function.  Patient has bacteremia and UTI will need to be followed up upon. 3. Follow-up with Dr. Harrell Gave End, cardiology as previously scheduled. 4. Patient will be discharged home with home health therapies.   Discharge Diagnoses:  Principal Problem:   Sepsis ([place]) Active Problems:   Bacterial infection due to Proteus mirabilis   Essential hypertension   GERD   Chronic kidney disease, stage 3 (HCC)   Urothelial carcinoma of bladder (HCC)   Thrombocytopenia (HCC)   Chronic systolic CHF (congestive heart failure) (HCC)   UTI (urinary tract infection)   AKI (acute kidney injury) (HCC)   Moderate episode of recurrent major depressive disorder ([place])   Discharge Condition: Stable and improved  Diet recommendation: Heart healthy  Transition Care Management Follow-up Telephone Call   Date discharged? 11/10/18   How have you been since you were released from the hospital? [place].  Bladder spasms are back. It has improved with drinking water.   Do you understand why you were in the hospital? yes   Do you understand the discharge instructions? yes   Where were you discharged to? home   Items Reviewed:  Medications reviewed: yes  Allergies reviewed: yes  Dietary changes reviewed: yes - heart healthy  Referrals reviewed: yes   Functional Questionnaire:   Activities of Daily Living (ADLs):   He states they are independent in the following:  States they require assistance with the following: continence   Any transportation issues/concerns?: no   Any patient concerns? no   Confirmed importance and  date/time of follow-up visits scheduled yes  Provider Appointment booked with Dr Sarajane Jews 11/14/18 at 3 pm  Confirmed with patient if condition begins to worsen call PCP or go to the ER.  Patient was given the office number and encouraged to call back with question or concerns.  : yes

## 2018-11-13 DIAGNOSIS — N390 Urinary tract infection, site not specified: Secondary | ICD-10-CM | POA: Diagnosis not present

## 2018-11-13 DIAGNOSIS — Z8551 Personal history of malignant neoplasm of bladder: Secondary | ICD-10-CM | POA: Diagnosis not present

## 2018-11-13 DIAGNOSIS — Z933 Colostomy status: Secondary | ICD-10-CM | POA: Diagnosis not present

## 2018-11-13 DIAGNOSIS — Z960 Presence of urogenital implants: Secondary | ICD-10-CM | POA: Diagnosis not present

## 2018-11-13 DIAGNOSIS — R7881 Bacteremia: Secondary | ICD-10-CM | POA: Diagnosis not present

## 2018-11-13 DIAGNOSIS — Z951 Presence of aortocoronary bypass graft: Secondary | ICD-10-CM | POA: Diagnosis not present

## 2018-11-13 DIAGNOSIS — Z85038 Personal history of other malignant neoplasm of large intestine: Secondary | ICD-10-CM | POA: Diagnosis not present

## 2018-11-13 DIAGNOSIS — I5022 Chronic systolic (congestive) heart failure: Secondary | ICD-10-CM | POA: Diagnosis not present

## 2018-11-13 DIAGNOSIS — I2510 Atherosclerotic heart disease of native coronary artery without angina pectoris: Secondary | ICD-10-CM | POA: Diagnosis not present

## 2018-11-13 DIAGNOSIS — B964 Proteus (mirabilis) (morganii) as the cause of diseases classified elsewhere: Secondary | ICD-10-CM | POA: Diagnosis not present

## 2018-11-14 ENCOUNTER — Encounter: Payer: Self-pay | Admitting: Internal Medicine

## 2018-11-14 ENCOUNTER — Ambulatory Visit (INDEPENDENT_AMBULATORY_CARE_PROVIDER_SITE_OTHER): Payer: Medicare HMO | Admitting: Family Medicine

## 2018-11-14 ENCOUNTER — Other Ambulatory Visit (HOSPITAL_COMMUNITY): Payer: Medicare HMO

## 2018-11-14 ENCOUNTER — Encounter: Payer: Self-pay | Admitting: Family Medicine

## 2018-11-14 ENCOUNTER — Ambulatory Visit (HOSPITAL_COMMUNITY): Payer: Medicare HMO

## 2018-11-14 ENCOUNTER — Ambulatory Visit (INDEPENDENT_AMBULATORY_CARE_PROVIDER_SITE_OTHER): Payer: Medicare HMO | Admitting: Internal Medicine

## 2018-11-14 VITALS — BP 110/68 | HR 77 | Ht 68.0 in | Wt 186.6 lb

## 2018-11-14 VITALS — BP 104/62 | HR 70 | Temp 97.9°F | Wt 188.2 lb

## 2018-11-14 DIAGNOSIS — N183 Chronic kidney disease, stage 3 unspecified: Secondary | ICD-10-CM

## 2018-11-14 DIAGNOSIS — D649 Anemia, unspecified: Secondary | ICD-10-CM | POA: Diagnosis not present

## 2018-11-14 DIAGNOSIS — N390 Urinary tract infection, site not specified: Secondary | ICD-10-CM

## 2018-11-14 DIAGNOSIS — A419 Sepsis, unspecified organism: Secondary | ICD-10-CM | POA: Diagnosis not present

## 2018-11-14 DIAGNOSIS — I5023 Acute on chronic systolic (congestive) heart failure: Secondary | ICD-10-CM | POA: Diagnosis not present

## 2018-11-14 DIAGNOSIS — I10 Essential (primary) hypertension: Secondary | ICD-10-CM | POA: Diagnosis not present

## 2018-11-14 DIAGNOSIS — I25118 Atherosclerotic heart disease of native coronary artery with other forms of angina pectoris: Secondary | ICD-10-CM

## 2018-11-14 DIAGNOSIS — T83511A Infection and inflammatory reaction due to indwelling urethral catheter, initial encounter: Secondary | ICD-10-CM

## 2018-11-14 NOTE — Progress Notes (Signed)
   Subjective:    Patient ID: Charles Coffey, male    DOB: 10/17/1935, 83 y.o.   MRN: [id number]  HPI Here to follow up on a hospital stay from 11-06-18 to 11-10-18 for sepsis from a UTI. This was shown to be from Proteus mirabilis and he was treated with Augmentin. He had an acute renal failure with creatinine going up to 2.13, but this was down to his baseline of 1.81 by DC. He has 5 more days to go on this. He feels much better with more energy and a better appetite. However just after getting home he had a sudden 10 lb weight gain, his ankles swelled and he felt SOB. He had been taken off all Lasix for a few weeks. He immediately started back on Lasix 40 mg bid for a few days and now he is down to once a day. The SOB and swelling are gone.    Review of Systems  Constitutional: Negative.   Respiratory: Negative.   Cardiovascular: Negative.   Genitourinary: Negative.   Neurological: Negative.        Objective:   Physical Exam  Constitutional: He is oriented to person, place, and time. He appears well-developed and well-nourished.  Neck: No thyromegaly present.  Cardiovascular: Normal rate, regular rhythm, normal heart sounds and intact distal pulses.  Pulmonary/Chest: Effort normal and breath sounds normal. No stridor. No respiratory distress. He has no wheezes.  Musculoskeletal:  Trace ankle edema   Lymphadenopathy:    He has no cervical adenopathy.  Neurological: He is alert and oriented to person, place, and time.          Assessment & Plan:  Partially treated UTI, he will finish up the Augmentin. He will follow up with Dr. Roni Bread soon. As far as his renal function, he will see Oncology in 2 days and he will have a BMET drawn as part of this visit. He will see Dr. Saunders Revel for a Cardiology visit later today. Alysia Penna, MD

## 2018-11-14 NOTE — Progress Notes (Signed)
Follow-up Outpatient Visit Date: 11/14/2018  Primary Care Provider: [name], MD [address] [place] [address]  Chief Complaint: Swelling  HPI:  Charles Coffey is a 83 y.o. year-old male with history of coronary artery disease status post remote MI and CABG in 2002, ischemic cardiomyopathy, hypertension, hyperlipidemia, IBS, restless leg syndrome, BPH, bladder cancer, GERD, renal insufficiency, anemia, and thrombocytopenia, who presents for follow-up of failure and coronary artery disease.  I last saw him in February, at which time he reported feeling quite well despite preceding hospitalizations for urinary obstruction due to bladder cancer with urosepsis and volume overload as well as an infrapopliteal right lower extremity DVT.  Since our last visit, Charles Coffey has been hospitalized several times, most recently earlier this month due to a urinary tract infection.  He now has bilateral nephrostomy tubes, ureteral stents, and a suprapubic tube in place.  After being discharged from [facility] on 11/10/2018, Charles Coffey noticed significant leg edema and decreased urine output.  He also had increasing shortness of breath, which prompted him to begin taking furosemide 40 mg daily.  Due to minimal change, this was increased to twice daily dosing with ultimate increase in urine output and some improvement in his swelling.  Charles Coffey still has leg swelling and and is about 6 pounds above his dry weight of 180 pounds.  He has not had chest pain nor palpitations.  He notes intermittent lightheadedness.  He required a blood transfusion about a month ago due to hemoglobin of 7.  He is scheduled for follow-up with his oncologist in 2 days.  --------------------------------------------------------------------------------------------------  Cardiovascular History & Procedures: Cardiovascular Problems:  Coronary artery disease status post CABG (2002)  Ischemic cardiomyopathy  Syncope  (2015 without recurrence)  Risk Factors:  Known CAD, carotid artery disease, hypertension, hyperlipidemia, age > 17  Cath/PCI:  LHC (08/25/17): LMCA 40% distal disease. LAD 40% proximal disease and 100% mid vessel occlusion. LCx with 40% OM1 disease and total occlusion of the mid vessel. RCA with mild diffuse disease and in-stent restenosis as well as chronic total occlusion of the distal vessel. LIMA to LAD widely patent. SVG to OM 2 patent with mild luminal irregularities. RIMA to PDA atretic. LVEDP 15-20 mmHg.  CV Surgery:  CABG (2002, [place]): LIMA->LAD, SVG->OM2, and RIMA->rPDA  EP Procedures and Devices:  None  Non-Invasive Evaluation(s):  Carotid Doppler (09/02/17): Heterogeneous plaque bilaterally with 1-39% internal carotid artery stenosis.  TTE (09/01/17): Normal LV size and wall thickness. Apical akinesis noted with LVEF of 50%. Grade 2 diastolic dysfunction. Moderately calcified aortic valve without stenosis or regurgitation. Mild annular calcification. Moderate mitral regurgitation. Mild left atrial enlargement. Normal RV size and function. Normal PA pressure.  Exercise MPI (08/23/17): High risk study. Moderate in size, severe, fixed apical anterior, septal, and mid anterior walls consistent with scar. Large, severe, reversible basal and mid inferolateral/inferior defect consistent with ischemia. LVEF 30-44%.  Bilateral lower extremity arterial studies (03/07/15): Normal ABIs (1.2 on the right, 1.1 on the left).  Recent CV Pertinent Labs: Lab Results  Component Value Date   CHOL 122 09/21/2017   HDL 41 09/21/2017   LDLCALC 65 09/21/2017   LDLDIRECT 72 09/21/2017   TRIG 79 09/21/2017   CHOLHDL 3.0 09/21/2017   CHOLHDL 3 12/01/2016   INR 1.05 09/20/2018   BNP 1,472.0 (H) 01/31/2018   K 3.5 11/10/2018   K 4.4 12/29/2017   MG 1.8 11/10/2018   BUN 30 (H) 11/10/2018   BUN 25 02/24/2018  BUN 31.5 (H) 12/29/2017   CREATININE 1.81 (H) 11/10/2018   CREATININE 1.77  (H) 09/23/2018   CREATININE 1.6 (H) 12/29/2017    Past medical and surgical history were reviewed and updated in EPIC.  Current Meds  Medication Sig  . amoxicillin-clavulanate (AUGMENTIN) 875-125 MG tablet Take 1 tablet by mouth 2 (two) times daily with a meal for 11 days.  . carvedilol (COREG) 3.125 MG tablet Take 3.125 mg by mouth 2 (two) times daily with a meal.  . DULoxetine (CYMBALTA) 20 MG capsule Take 1 capsule (20 mg total) by mouth 2 (two) times daily. Take once daily for 2 weeks then increase to twice daily  . famotidine (PEPCID) 20 MG tablet Take 1 tablet (20 mg total) by mouth 2 (two) times daily.  . feeding supplement (BOOST HIGH PROTEIN) LIQD Take 1 Container by mouth 2 (two) times daily between meals.  . furosemide (LASIX) 40 MG tablet Take 1 tablet (40 mg total) by mouth daily as needed for fluid or edema.  . hydrOXYzine (ATARAX/VISTARIL) 25 MG tablet Take 1 tablet (25 mg total) by mouth every 4 (four) hours as needed for itching. (Patient taking differently: Take 25 mg by mouth 2 (two) times daily. )  . lidocaine-prilocaine (EMLA) cream Apply 1 application topically as needed.  . mirabegron ER (MYRBETRIQ) 25 MG TB24 tablet Take 25 mg by mouth daily.  . nitroGLYCERIN (NITROSTAT) 0.4 MG SL tablet PLACE 1 TABLET (0.4 MG TOTAL) UNDER THE TONGUE EVERY 5 (FIVE) MINUTES AS NEEDED.  Marland Kitchen OLANZapine (ZYPREXA) 5 MG tablet Take 0.5 tablets (2.5 mg total) by mouth at bedtime.  Marland Kitchen oxyCODONE (OXYCONTIN) 10 mg 12 hr tablet Take 1 tablet (10 mg total) by mouth at bedtime.  Marland Kitchen oxyCODONE-acetaminophen (PERCOCET) 10-325 MG tablet Take 1 tablet by mouth every 4 (four) hours as needed for pain.  . polyvinyl alcohol (LIQUIFILM TEARS) 1.4 % ophthalmic solution Place 1 drop into both eyes 2 (two) times daily as needed (dry eyes).  . pramipexole (MIRAPEX) 1 MG tablet TAKE 1 AND 1/2 TABLETS BY MOUTH AT BEDTIME (Patient taking differently: Take 1.5 mg by mouth every evening. )  . triamcinolone cream  (KENALOG) 0.1 % Apply topically 3 (three) times daily. To affected areas    Allergies: Statins and Cyproheptadine  Social History   Tobacco Use  . Smoking status: Former Smoker    Types: Cigarettes    Last attempt to quit: 12/28/1978    Years since quitting: 39.9  . Smokeless tobacco: Never Used  Substance Use Topics  . Alcohol use: Not Currently    Alcohol/week: 2.0 standard drinks    Types: 1 Glasses of wine, 1 Cans of beer per week    Comment: occassional  . Drug use: No    Family History  Problem Relation Age of Onset  . Heart attack Mother   . Aneurysm Father        femoral artery  . Heart disease Brother   . Heart disease Maternal Uncle        x 2  . Colon cancer Neg Hx   . Esophageal cancer Neg Hx   . Pancreatic cancer Neg Hx   . Kidney disease Neg Hx   . Liver disease Neg Hx     Review of Systems: A 12-system review of systems was performed and was negative except as noted in the HPI.  --------------------------------------------------------------------------------------------------  Physical Exam: BP 110/68   Pulse 77   Ht 5\' 8"  (1.727 m)   Wt 186  lb 9.6 oz (84.6 kg)   BMI 28.37 kg/m   General: Pale appearing man, seated comfortably in the exam room.  He is accompanied by his wife. HEENT: Conjunctival pallor noted without scleral icterus. Moist mucous membranes.  OP clear. Neck: Supple without lymphadenopathy or thyromegaly.  JVP is approximately 8 cm. Lungs: Normal work of breathing. Clear to auscultation bilaterally without wheezes or crackles. Heart: Regular rate and rhythm without murmurs, rubs, or gallops. Abd: Bowel sounds present. Soft, NT/ND without hepatosplenomegaly Ext: 1+ pretibial edema is present to the proximal calves bilaterally. Skin: Warm and dry without rash.  EKG: Normal sinus rhythm with first-degree AV block and frequent PVCs, right bundle branch block with anterolateral and inferior Q waves.  Lab Results  Component Value Date     WBC 6.8 11/10/2018   HGB 8.0 (L) 11/10/2018   HCT 25.1 (L) 11/10/2018   MCV 100.8 (H) 11/10/2018   PLT 49 (L) 11/10/2018    Lab Results  Component Value Date   NA 139 11/10/2018   K 3.5 11/10/2018   CL 110 11/10/2018   CO2 19 (L) 11/10/2018   BUN 30 (H) 11/10/2018   CREATININE 1.81 (H) 11/10/2018   GLUCOSE 110 (H) 11/10/2018   ALT 13 11/06/2018    Lab Results  Component Value Date   CHOL 122 09/21/2017   HDL 41 09/21/2017   LDLCALC 65 09/21/2017   LDLDIRECT 72 09/21/2017   TRIG 79 09/21/2017   CHOLHDL 3.0 09/21/2017    --------------------------------------------------------------------------------------------------  ASSESSMENT AND PLAN: Acute on chronic systolic heart failure Though he has improved over the last few days, Charles Coffey continues to appear volume overloaded with NYHA class III symptoms.  I will check a CBC and basic metabolic panel today to ensure that his renal function is stable and that he has not become more anemic.  I recommend continuation of furosemide 40 mg twice daily until he has achieved his dry weight of 180 pounds and/or his leg edema has resolved.  We will continue carvedilol 3.125 mg twice daily, though if he becomes more lightheaded, this may need to be discontinued out as well.  His renal dysfunction and soft blood pressure precludes addition of an ACE inhibitor, ARB, and aldosterone antagonist.  Coronary artery disease with stable angina No chest pain reported by Charles Coffey.  Some of his dyspnea may be an anginal equivalent, though I suspect it is more likely due to heart failure.  We will continue low-dose carvedilol.  Due to chronic anemia and thrombocytopenia, he is not on antiplatelet therapy.  Chronic kidney disease I will check a basic metabolic panel today in the setting of recent report of oliguria and leg edema.  For now, we will plan to continue furosemide 40 mg twice daily, as outlined above.  Anemia Multifactorial, including  chronic kidney disease and bladder cancer.  I will check a CBC today.  If it is less than 8 or his dyspnea does not improve with continued diuresis, Charles Coffey may benefit from PRBC transfusion.  He is scheduled to follow-up with his oncologist in 2 days.  Follow-up: Return to clinic to see me on 12/01/2018.  [name], MD 11/14/2018 4:42 PM

## 2018-11-14 NOTE — Patient Instructions (Signed)
Medication Instructions:  LASIX 40mg  twice per day If you need a refill on your cardiac medications before your next appointment, please call your pharmacy.   Lab work: BMET/CBC If you have labs (blood work) drawn today and your tests are completely normal, you will receive your results only by: Marland Kitchen MyChart Message (if you have MyChart) OR . A paper copy in the mail If you have any lab test that is abnormal or we need to change your treatment, we will call you to review the results.  Testing/Procedures: NONE  Follow-Up: Dr End 12/5 2pm At [facility], you and your health needs are our priority.  As part of our continuing mission to provide you with exceptional heart care, we have created designated Provider Care Teams.  These Care Teams include your primary Cardiologist (physician) and Advanced Practice Providers (APPs -  Physician Assistants and Nurse Practitioners) who all work together to provide you with the care you need, when you need it. .   Any Other Special Instructions Will Be Listed Below (If Applicable).

## 2018-11-15 ENCOUNTER — Other Ambulatory Visit: Payer: Self-pay | Admitting: *Deleted

## 2018-11-15 ENCOUNTER — Encounter: Payer: Self-pay | Admitting: Internal Medicine

## 2018-11-15 ENCOUNTER — Ambulatory Visit (HOSPITAL_COMMUNITY): Payer: Medicare HMO

## 2018-11-15 ENCOUNTER — Other Ambulatory Visit (HOSPITAL_COMMUNITY): Payer: Medicare HMO

## 2018-11-15 ENCOUNTER — Telehealth: Payer: Self-pay | Admitting: *Deleted

## 2018-11-15 DIAGNOSIS — M545 Low back pain: Secondary | ICD-10-CM

## 2018-11-15 DIAGNOSIS — C679 Malignant neoplasm of bladder, unspecified: Secondary | ICD-10-CM

## 2018-11-15 DIAGNOSIS — G8929 Other chronic pain: Secondary | ICD-10-CM

## 2018-11-15 DIAGNOSIS — G893 Neoplasm related pain (acute) (chronic): Secondary | ICD-10-CM

## 2018-11-15 LAB — CULTURE, BLOOD (ROUTINE X 2)
CULTURE: NO GROWTH
SPECIAL REQUESTS: ADEQUATE

## 2018-11-15 LAB — CBC
HEMATOCRIT: 24.4 % — AB (ref 37.5–51.0)
Hemoglobin: 8.2 g/dL — ABNORMAL LOW (ref 13.0–17.7)
MCH: 32.3 pg (ref 26.6–33.0)
MCHC: 33.6 g/dL (ref 31.5–35.7)
MCV: 96 fL (ref 79–97)
PLATELETS: 64 10*3/uL — AB (ref 150–450)
RBC: 2.54 x10E6/uL — CL (ref 4.14–5.80)
RDW: 18.8 % — ABNORMAL HIGH (ref 12.3–15.4)
WBC: 6.7 10*3/uL (ref 3.4–10.8)

## 2018-11-15 LAB — BASIC METABOLIC PANEL
BUN/Creatinine Ratio: 19 (ref 10–24)
BUN: 35 mg/dL — ABNORMAL HIGH (ref 8–27)
CO2: 18 mmol/L — AB (ref 20–29)
CREATININE: 1.87 mg/dL — AB (ref 0.76–1.27)
Calcium: 8.9 mg/dL (ref 8.6–10.2)
Chloride: 103 mmol/L (ref 96–106)
GFR, EST AFRICAN AMERICAN: 38 mL/min/{1.73_m2} — AB (ref >59)
GFR, EST NON AFRICAN AMERICAN: 33 mL/min/{1.73_m2} — AB (ref >59)
Glucose: 96 mg/dL (ref 65–99)
POTASSIUM: 3.9 mmol/L (ref 3.5–5.2)
SODIUM: 137 mmol/L (ref 134–144)

## 2018-11-15 MED ORDER — OXYCODONE-ACETAMINOPHEN 10-325 MG PO TABS: 1.0000 | ORAL_TABLET | ORAL | 0 refills | Status: DC | PRN

## 2018-11-15 NOTE — Telephone Encounter (Signed)
Spoke with wife Charles Coffey, patient recently discharged from hospital. Saw PCP dr Sharlene Motts,  He is referring patient to palliative care. Charles Coffey is requesting a refill on percocet #90 please, until they are established with palliative care of [place].

## 2018-11-15 NOTE — Telephone Encounter (Signed)
Script for percocet left at front for patient p/u. Wife notified

## 2018-11-15 NOTE — Telephone Encounter (Signed)
Ok to refill 

## 2018-11-16 ENCOUNTER — Inpatient Hospital Stay: Payer: Medicare HMO

## 2018-11-16 ENCOUNTER — Telehealth: Payer: Self-pay | Admitting: Family Medicine

## 2018-11-16 ENCOUNTER — Telehealth: Payer: Self-pay

## 2018-11-16 NOTE — Telephone Encounter (Signed)
Copied from [place]. Topic: Quick Communication - See Telephone Encounter >> Nov 16, 2018  9:59 AM Ivar Drape wrote: CRM for notification. See Telephone encounter for: 11/16/18. Merrilee Seashore a PTA w/[facility] [phone] needs verbal orders for nursing: 3w1, 2w1, 1w1, 2 prn's

## 2018-11-16 NOTE — Telephone Encounter (Signed)
Patient wife called to say that she spoke with Lady Saucier nurse with [facility] and was informed that Dr Sarajane Jews need to fax over written orders for the nurse to be able to go in and establish care with the patient. Patient Ph# [phone]

## 2018-11-16 NOTE — Telephone Encounter (Signed)
Notes recorded by Frederik Schmidt, RN on 11/16/2018 at 8:42 AM EST [email or web address] 11/20 ------

## 2018-11-16 NOTE — Telephone Encounter (Signed)
-----   Message from [name], MD sent at 11/15/2018 11:29 AM EST ----- Please let Mr. Collier know that his hemoglobin, kidney function, and potassium are stable.  I recommend that he continue taking furosemide 40 mg BID until his swelling resolves and he is back to his baseline weight of 180 pounds.  At that time, he can cut back to furosemide 40 mg daily.  He should follow-up with his oncologist tomorrow, as previously scheduled.

## 2018-11-16 NOTE — Telephone Encounter (Signed)
Dr. Sarajane Jews please advise if ok to give the VO for the PT as requested by [facility] thanks

## 2018-11-17 ENCOUNTER — Telehealth: Payer: Self-pay

## 2018-11-17 ENCOUNTER — Encounter: Payer: Self-pay | Admitting: Family Medicine

## 2018-11-17 ENCOUNTER — Other Ambulatory Visit: Payer: Self-pay | Admitting: Family Medicine

## 2018-11-17 DIAGNOSIS — J9601 Acute respiratory failure with hypoxia: Secondary | ICD-10-CM

## 2018-11-17 MED ORDER — HYDROXYZINE HCL 25 MG PO TABS: 25.0000 mg | ORAL_TABLET | ORAL | 2 refills | Status: DC | PRN

## 2018-11-17 NOTE — Telephone Encounter (Signed)
Notes recorded by Frederik Schmidt, RN on 11/17/2018 at 1:40 PM EST [email or web address] 11/21 ------

## 2018-11-17 NOTE — Telephone Encounter (Signed)
lpmtcb 11/21

## 2018-11-17 NOTE — Telephone Encounter (Signed)
Mailed recommendations to patient.  Tried numerous times to reach him by telephone.

## 2018-11-17 NOTE — Telephone Encounter (Signed)
-----   Message from [name], MD sent at 11/15/2018 11:29 AM EST ----- Please let Charles Coffey know that his hemoglobin, kidney function, and potassium are stable.  I recommend that he continue taking furosemide 40 mg BID until his swelling resolves and he is back to his baseline weight of 180 pounds.  At that time, he can cut back to furosemide 40 mg daily.  He should follow-up with his oncologist tomorrow, as previously scheduled.

## 2018-11-18 ENCOUNTER — Telehealth: Payer: Self-pay

## 2018-11-18 ENCOUNTER — Inpatient Hospital Stay: Payer: Medicare HMO | Attending: Oncology | Admitting: Oncology

## 2018-11-18 VITALS — BP 114/66 | HR 76 | Temp 97.5°F | Resp 18 | Wt 189.4 lb

## 2018-11-18 DIAGNOSIS — C786 Secondary malignant neoplasm of retroperitoneum and peritoneum: Secondary | ICD-10-CM | POA: Insufficient documentation

## 2018-11-18 DIAGNOSIS — N1330 Unspecified hydronephrosis: Secondary | ICD-10-CM | POA: Insufficient documentation

## 2018-11-18 DIAGNOSIS — R410 Disorientation, unspecified: Secondary | ICD-10-CM | POA: Diagnosis not present

## 2018-11-18 DIAGNOSIS — N3289 Other specified disorders of bladder: Secondary | ICD-10-CM | POA: Insufficient documentation

## 2018-11-18 DIAGNOSIS — R05 Cough: Secondary | ICD-10-CM | POA: Insufficient documentation

## 2018-11-18 DIAGNOSIS — D6959 Other secondary thrombocytopenia: Secondary | ICD-10-CM | POA: Insufficient documentation

## 2018-11-18 DIAGNOSIS — G4700 Insomnia, unspecified: Secondary | ICD-10-CM | POA: Insufficient documentation

## 2018-11-18 DIAGNOSIS — R062 Wheezing: Secondary | ICD-10-CM | POA: Insufficient documentation

## 2018-11-18 DIAGNOSIS — N189 Chronic kidney disease, unspecified: Secondary | ICD-10-CM

## 2018-11-18 DIAGNOSIS — D649 Anemia, unspecified: Secondary | ICD-10-CM | POA: Insufficient documentation

## 2018-11-18 DIAGNOSIS — C679 Malignant neoplasm of bladder, unspecified: Secondary | ICD-10-CM | POA: Diagnosis not present

## 2018-11-18 DIAGNOSIS — D631 Anemia in chronic kidney disease: Secondary | ICD-10-CM

## 2018-11-18 DIAGNOSIS — R600 Localized edema: Secondary | ICD-10-CM | POA: Insufficient documentation

## 2018-11-18 NOTE — Telephone Encounter (Signed)
This is a good idea. Please get him a DNR form and place a referral to Palliative Care.

## 2018-11-18 NOTE — Telephone Encounter (Signed)
-----   Message from [name], MD sent at 11/15/2018 11:29 AM EST ----- Please let Charles Coffey know that his hemoglobin, kidney function, and potassium are stable.  I recommend that he continue taking furosemide 40 mg BID until his swelling resolves and he is back to his baseline weight of 180 pounds.  At that time, he can cut back to furosemide 40 mg daily.  He should follow-up with his oncologist tomorrow, as previously scheduled.

## 2018-11-18 NOTE — Telephone Encounter (Signed)
Printed avs and calender of upcoming appointment. Per 11/22 los

## 2018-11-18 NOTE — Telephone Encounter (Signed)
The form is ready to fax  °

## 2018-11-18 NOTE — Telephone Encounter (Signed)
Pt's spouse called back in to follow up stating that nursing services has been delayed due to form not being faxed.   Advised spouse that form has been completed by PCP. Called and spoke with [name], he will have form faxed right now by one of the nurses in the office.   Advised spouse. She is grateful.

## 2018-11-18 NOTE — Telephone Encounter (Signed)
Charles Coffey did fax these 2 forms and these have been placed in the scan folder.

## 2018-11-18 NOTE — Telephone Encounter (Signed)
Dr. Fry please advise. Thanks  

## 2018-11-18 NOTE — Progress Notes (Signed)
Hematology and Oncology Follow Up Visit  Charles Coffey [id number] [birth year] 83 y.o. 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  His tumor showed PDL 1 positive with 25% CPS.     Prior Therapy:  He is status post cystoscopy and transurethral resection of a bladder tumor and on 11/25/2017.  He is status post bilateral nephrostomy tube placement in November 2018.  He is status post diverting colostomy done on 12/01/2017 performed by Dr. Barry Dienes due to colonic obstruction.  Chemotherapy utilizing gemcitabine and carboplatin.  Day 1 of cycle 1 was given on December 30, 2017.  He completed 2 cycles of therapy.  Therapy discontinued based on his wishes to poor tolerance.  Pembrolizumab 200 mg every 3 weeks started on March 23, 2018.  He completed 3 cycles of therapy.  Last treatment given on July 22, 2018 and received total of 4 cycles.  Current therapy:    Interim History: [name] returns today for a follow-up visit.  Since the last visit, he was hospitalized in November 2019 for urinary tract infection with Proteus organism.  Since his discharge, he remains on antibiotics which he is currently taking.  He denies any fevers or chills and overall has been eating better.  He continues to have chronic issues related to bladder spasms as well as insomnia and periodic confusion.  These issues are chronic and currently unchanged.  His quality of life remains unchanged at this time.   He does not report any headaches, blurry vision, syncope or seizures.  He does report confusion. he does not report any fevers, chills, sweats. He does not report any chest pain, palpitation, orthopnea. .  He denies any hemoptysis or wheezing.  He does not report any nausea, vomiting or abdominal pain.  He denies any constipation or diarrhea.  He denies any  dysuria or hematuria.  He does not report any arthralgias or myalgias.   He does not report any skin rashes or lesions.  He denied any bleeding or clotting tendency.  Remaining review of systems is negative.  Medications: I have reviewed the patient's medication and discussed with the patient personally today. Current Outpatient Medications  Medication Sig Dispense Refill  . amoxicillin-clavulanate (AUGMENTIN) 875-125 MG tablet Take 1 tablet by mouth 2 (two) times daily with a meal for 11 days. 22 tablet 0  . carvedilol (COREG) 3.125 MG tablet Take 3.125 mg by mouth 2 (two) times daily with a meal.    . DULoxetine (CYMBALTA) 20 MG capsule Take 1 capsule (20 mg total) by mouth 2 (two) times daily. Take once daily for 2 weeks then increase to twice daily    . famotidine (PEPCID) 20 MG tablet Take 1 tablet (20 mg total) by mouth 2 (two) times daily. 360 tablet 2  . feeding supplement (BOOST HIGH PROTEIN) LIQD Take 1 Container by mouth 2 (two) times daily between meals.    . furosemide (LASIX) 40 MG tablet Take 1 tablet (40 mg total) by mouth daily as needed for fluid or edema.    . hydrOXYzine (ATARAX/VISTARIL) 25 MG tablet Take 1 tablet (25 mg total) by mouth every 4 (four) hours as needed for itching. 100 tablet 2  . lidocaine-prilocaine (EMLA) cream Apply 1 application topically as needed. 30 g 3  . mirabegron ER (MYRBETRIQ) 25 MG TB24 tablet Take 25 mg by mouth daily.    Marland Kitchen  nitroGLYCERIN (NITROSTAT) 0.4 MG SL tablet PLACE 1 TABLET (0.4 MG TOTAL) UNDER THE TONGUE EVERY 5 (FIVE) MINUTES AS NEEDED. 25 tablet 0  . OLANZapine (ZYPREXA) 5 MG tablet Take 0.5 tablets (2.5 mg total) by mouth at bedtime. 30 tablet 2  . oxyCODONE (OXYCONTIN) 10 mg 12 hr tablet Take 1 tablet (10 mg total) by mouth at bedtime.    Marland Kitchen oxyCODONE-acetaminophen (PERCOCET) 10-325 MG tablet Take 1 tablet by mouth every 4 (four) hours as needed for pain. 90 tablet 0  . polyvinyl alcohol (LIQUIFILM TEARS) 1.4 % ophthalmic solution Place  1 drop into both eyes 2 (two) times daily as needed (dry eyes). 15 mL 0  . pramipexole (MIRAPEX) 1 MG tablet TAKE 1 AND 1/2 TABLETS BY MOUTH AT BEDTIME (Patient taking differently: Take 1.5 mg by mouth every evening. ) 135 tablet 3  . triamcinolone cream (KENALOG) 0.1 % Apply topically 3 (three) times daily. To affected areas 30 g 0   No current facility-administered medications for this visit.      Allergies:  Allergies  Allergen Reactions  . Statins Other (See Comments)    liver effects  . Cyproheptadine Other (See Comments)    Unable to urinate    Past Medical History, Surgical history, Social history, and Family History reviewed today and remain unchanged.   Physical Exam: Blood pressure 114/66, pulse 76, temperature (!) 97.5 F (36.4 C), temperature source Oral, resp. rate 18, weight 189 lb 6 oz (85.9 kg), SpO2 98 %.   ECOG 1   General appearance: Alert, awake without any distress. Head: Atraumatic without abnormalities Oropharynx: Without any thrush or ulcers. Eyes: No scleral icterus. Lymph nodes: No lymphadenopathy noted in the cervical, supraclavicular, or axillary nodes Heart:regular rate and rhythm, without any murmurs or gallops.   Lung: Clear to auscultation without any rhonchi, wheezes or dullness to percussion. Abdomin: Soft, nontender without any shifting dullness or ascites. Musculoskeletal: No clubbing or cyanosis. Neurological: No motor or sensory deficits. Skin: No rashes or lesions. Psychiatric: Mood and affect appeared normal.    Lab Results: Lab Results  Component Value Date   WBC 6.7 11/14/2018   HGB 8.2 (L) 11/14/2018   HCT 24.4 (L) 11/14/2018   MCV 96 11/14/2018   PLT 64 (LL) 11/14/2018     Chemistry      Component Value Date/Time   NA 137 11/14/2018 1656   NA 133 (L) 12/29/2017 1014   K 3.9 11/14/2018 1656   K 4.4 12/29/2017 1014   CL 103 11/14/2018 1656   CO2 18 (L) 11/14/2018 1656   CO2 20 (L) 12/29/2017 1014   BUN 35 (H)  11/14/2018 1656   BUN 31.5 (H) 12/29/2017 1014   CREATININE 1.87 (H) 11/14/2018 1656   CREATININE 1.77 (H) 09/23/2018 0913   CREATININE 1.6 (H) 12/29/2017 1014      Component Value Date/Time   CALCIUM 8.9 11/14/2018 1656   CALCIUM 9.6 12/29/2017 1014   ALKPHOS 58 11/06/2018 1958   ALKPHOS 61 12/29/2017 1014   AST 17 11/06/2018 1958   AST 17 09/23/2018 0913   AST 15 12/29/2017 1014   ALT 13 11/06/2018 1958   ALT 19 09/23/2018 0913   ALT 13 12/29/2017 1014   BILITOT 1.3 (H) 11/06/2018 1958   BILITOT 0.8 09/23/2018 0913   BILITOT 0.43 12/29/2017 1935       83 year old man with:   1.  High-grade urothelial carcinoma involving the genitourinary tract diagnosed in 2018.    He is currently on  supportive care only given his poor tolerance to chemotherapy and immunotherapy at this time.  His performance status remains reasonable but given his overall disease status I have recommended supportive management only and addressing symptoms as they arise.  His disease is incurable and likely will continue to spread in the upcoming months.  His wishes are to address only cancer related symptoms without any definitive treatment of the cancer.  I agree with this sentiment and approach.  2.  Bilateral hydronephrosis: He has a nephrostomy tube internalized with a suprapubic catheter in place.  Followed by Dr. [name] regarding this issue.  3.  Thrombocytopenia: Related to malignancy and previous treatment.  Platelet count continues to improve.  4.  IV access: Port-A-Cath remains in place and will be flushed periodically.  5.  Pelvic pain: Related to bladder spasm overall.  He does use Percocet and rarely OxyContin.  6.  Prognosis and goals of care: This was reiterated again today.  His disease is incurable although his performance status has not declined but likely will in the near future.  I agree with outpatient palliative care services to get involved to address his symptoms as they arise.  7.   Follow-up: In 4 months to follow his progress.  25 minutes was spent today with the patient face-to-face.  More than 50% was dedicated to discussing his disease status, treatment options, prognosis and addressing his cancer related symptoms.  Zola Button, MD 11/22/20199:06 AM

## 2018-11-18 NOTE — Telephone Encounter (Signed)
LMTCB

## 2018-11-21 ENCOUNTER — Telehealth: Payer: Self-pay

## 2018-11-21 DIAGNOSIS — Z933 Colostomy status: Secondary | ICD-10-CM | POA: Diagnosis not present

## 2018-11-21 DIAGNOSIS — K624 Stenosis of anus and rectum: Secondary | ICD-10-CM | POA: Diagnosis not present

## 2018-11-21 NOTE — Telephone Encounter (Signed)
Notes recorded by Frederik Schmidt, RN on 11/21/2018 at 9:37 AM EST Mailed results/recommendations on 11/21

## 2018-11-25 ENCOUNTER — Other Ambulatory Visit: Payer: Self-pay | Admitting: Medical

## 2018-11-25 ENCOUNTER — Observation Stay (HOSPITAL_COMMUNITY)
Admission: EM | Admit: 2018-11-25 | Discharge: 2018-11-26 | Disposition: A | Discharge status: Home / Self Care | Payer: Medicare HMO | Attending: Family Medicine | Admitting: Family Medicine

## 2018-11-25 ENCOUNTER — Encounter (HOSPITAL_COMMUNITY): Payer: Self-pay | Admitting: Emergency Medicine

## 2018-11-25 ENCOUNTER — Emergency Department (HOSPITAL_BASED_OUTPATIENT_CLINIC_OR_DEPARTMENT_OTHER): Payer: Medicare HMO

## 2018-11-25 ENCOUNTER — Inpatient Hospital Stay (HOSPITAL_BASED_OUTPATIENT_CLINIC_OR_DEPARTMENT_OTHER): Payer: Medicare HMO | Admitting: Medical

## 2018-11-25 ENCOUNTER — Inpatient Hospital Stay: Payer: Medicare HMO

## 2018-11-25 ENCOUNTER — Other Ambulatory Visit: Payer: Self-pay

## 2018-11-25 ENCOUNTER — Ambulatory Visit (HOSPITAL_COMMUNITY)
Admission: RE | Admit: 2018-11-25 | Discharge: 2018-11-25 | Disposition: A | Discharge status: Home / Self Care | Payer: Medicare HMO | Source: Ambulatory Visit | Attending: Medical | Admitting: Medical

## 2018-11-25 VITALS — BP 105/68 | HR 92 | Temp 97.8°F | Resp 16 | Ht 68.0 in | Wt 185.4 lb

## 2018-11-25 DIAGNOSIS — I951 Orthostatic hypotension: Secondary | ICD-10-CM

## 2018-11-25 DIAGNOSIS — J90 Pleural effusion, not elsewhere classified: Secondary | ICD-10-CM | POA: Insufficient documentation

## 2018-11-25 DIAGNOSIS — D649 Anemia, unspecified: Secondary | ICD-10-CM | POA: Diagnosis not present

## 2018-11-25 DIAGNOSIS — I509 Heart failure, unspecified: Secondary | ICD-10-CM | POA: Diagnosis not present

## 2018-11-25 DIAGNOSIS — K219 Gastro-esophageal reflux disease without esophagitis: Secondary | ICD-10-CM | POA: Diagnosis not present

## 2018-11-25 DIAGNOSIS — R059 Cough, unspecified: Secondary | ICD-10-CM

## 2018-11-25 DIAGNOSIS — C679 Malignant neoplasm of bladder, unspecified: Secondary | ICD-10-CM

## 2018-11-25 DIAGNOSIS — N136 Pyonephrosis: Secondary | ICD-10-CM | POA: Diagnosis not present

## 2018-11-25 DIAGNOSIS — Z79899 Other long term (current) drug therapy: Secondary | ICD-10-CM | POA: Insufficient documentation

## 2018-11-25 DIAGNOSIS — R0989 Other specified symptoms and signs involving the circulatory and respiratory systems: Secondary | ICD-10-CM | POA: Diagnosis not present

## 2018-11-25 DIAGNOSIS — I10 Essential (primary) hypertension: Secondary | ICD-10-CM

## 2018-11-25 DIAGNOSIS — I700 Atherosclerosis of aorta: Secondary | ICD-10-CM | POA: Insufficient documentation

## 2018-11-25 DIAGNOSIS — D696 Thrombocytopenia, unspecified: Secondary | ICD-10-CM | POA: Diagnosis not present

## 2018-11-25 DIAGNOSIS — Z66 Do not resuscitate: Secondary | ICD-10-CM | POA: Diagnosis not present

## 2018-11-25 DIAGNOSIS — E785 Hyperlipidemia, unspecified: Secondary | ICD-10-CM | POA: Insufficient documentation

## 2018-11-25 DIAGNOSIS — I2510 Atherosclerotic heart disease of native coronary artery without angina pectoris: Secondary | ICD-10-CM | POA: Diagnosis not present

## 2018-11-25 DIAGNOSIS — R0602 Shortness of breath: Secondary | ICD-10-CM | POA: Diagnosis present

## 2018-11-25 DIAGNOSIS — F329 Major depressive disorder, single episode, unspecified: Secondary | ICD-10-CM | POA: Insufficient documentation

## 2018-11-25 DIAGNOSIS — R062 Wheezing: Secondary | ICD-10-CM | POA: Diagnosis not present

## 2018-11-25 DIAGNOSIS — M7989 Other specified soft tissue disorders: Secondary | ICD-10-CM | POA: Diagnosis not present

## 2018-11-25 DIAGNOSIS — R5383 Other fatigue: Secondary | ICD-10-CM | POA: Diagnosis not present

## 2018-11-25 DIAGNOSIS — R600 Localized edema: Secondary | ICD-10-CM

## 2018-11-25 DIAGNOSIS — D6959 Other secondary thrombocytopenia: Secondary | ICD-10-CM | POA: Diagnosis not present

## 2018-11-25 DIAGNOSIS — I252 Old myocardial infarction: Secondary | ICD-10-CM | POA: Diagnosis not present

## 2018-11-25 DIAGNOSIS — G2581 Restless legs syndrome: Secondary | ICD-10-CM | POA: Diagnosis not present

## 2018-11-25 DIAGNOSIS — Z8551 Personal history of malignant neoplasm of bladder: Secondary | ICD-10-CM | POA: Diagnosis not present

## 2018-11-25 DIAGNOSIS — N3289 Other specified disorders of bladder: Secondary | ICD-10-CM | POA: Diagnosis not present

## 2018-11-25 DIAGNOSIS — G4700 Insomnia, unspecified: Secondary | ICD-10-CM | POA: Diagnosis not present

## 2018-11-25 DIAGNOSIS — Z888 Allergy status to other drugs, medicaments and biological substances status: Secondary | ICD-10-CM | POA: Insufficient documentation

## 2018-11-25 DIAGNOSIS — R69 Illness, unspecified: Secondary | ICD-10-CM | POA: Diagnosis not present

## 2018-11-25 DIAGNOSIS — Z951 Presence of aortocoronary bypass graft: Secondary | ICD-10-CM | POA: Insufficient documentation

## 2018-11-25 DIAGNOSIS — R05 Cough: Secondary | ICD-10-CM | POA: Insufficient documentation

## 2018-11-25 DIAGNOSIS — N183 Chronic kidney disease, stage 3 unspecified: Secondary | ICD-10-CM

## 2018-11-25 DIAGNOSIS — Z87891 Personal history of nicotine dependence: Secondary | ICD-10-CM | POA: Insufficient documentation

## 2018-11-25 DIAGNOSIS — I5023 Acute on chronic systolic (congestive) heart failure: Secondary | ICD-10-CM | POA: Diagnosis not present

## 2018-11-25 DIAGNOSIS — D691 Qualitative platelet defects: Secondary | ICD-10-CM | POA: Diagnosis present

## 2018-11-25 DIAGNOSIS — Z87440 Personal history of urinary (tract) infections: Secondary | ICD-10-CM | POA: Insufficient documentation

## 2018-11-25 DIAGNOSIS — C786 Secondary malignant neoplasm of retroperitoneum and peritoneum: Secondary | ICD-10-CM

## 2018-11-25 DIAGNOSIS — Z933 Colostomy status: Secondary | ICD-10-CM | POA: Diagnosis not present

## 2018-11-25 DIAGNOSIS — L299 Pruritus, unspecified: Secondary | ICD-10-CM | POA: Diagnosis not present

## 2018-11-25 DIAGNOSIS — R8290 Unspecified abnormal findings in urine: Secondary | ICD-10-CM

## 2018-11-25 DIAGNOSIS — N1330 Unspecified hydronephrosis: Secondary | ICD-10-CM

## 2018-11-25 DIAGNOSIS — I130 Hypertensive heart and chronic kidney disease with heart failure and stage 1 through stage 4 chronic kidney disease, or unspecified chronic kidney disease: Principal | ICD-10-CM | POA: Insufficient documentation

## 2018-11-25 DIAGNOSIS — I110 Hypertensive heart disease with heart failure: Secondary | ICD-10-CM | POA: Diagnosis not present

## 2018-11-25 DIAGNOSIS — K5730 Diverticulosis of large intestine without perforation or abscess without bleeding: Secondary | ICD-10-CM | POA: Insufficient documentation

## 2018-11-25 DIAGNOSIS — F32A Depression, unspecified: Secondary | ICD-10-CM

## 2018-11-25 DIAGNOSIS — G8929 Other chronic pain: Secondary | ICD-10-CM

## 2018-11-25 LAB — BLOOD GAS, ARTERIAL
Acid-Base Excess: 2.9 mmol/L — ABNORMAL HIGH (ref 0.0–2.0)
Bicarbonate: 26.1 mmol/L (ref 20.0–28.0)
Drawn by: 225631
FIO2: 21
O2 Saturation: 94 %
PO2 ART: 67.5 mmHg — AB (ref 83.0–108.0)
Patient temperature: 97.8
RATE: 18 resp/min
pCO2 arterial: 35.1 mmHg (ref 32.0–48.0)
pH, Arterial: 7.483 — ABNORMAL HIGH (ref 7.350–7.450)

## 2018-11-25 LAB — BASIC METABOLIC PANEL
Anion gap: 10 (ref 5–15)
BUN: 34 mg/dL — AB (ref 8–23)
CO2: 25 mmol/L (ref 22–32)
Calcium: 9.4 mg/dL (ref 8.9–10.3)
Chloride: 100 mmol/L (ref 98–111)
Creatinine, Ser: 2 mg/dL — ABNORMAL HIGH (ref 0.61–1.24)
GFR calc Af Amer: 35 mL/min — ABNORMAL LOW (ref >60)
GFR calc non Af Amer: 30 mL/min — ABNORMAL LOW (ref >60)
GLUCOSE: 99 mg/dL (ref 70–99)
Potassium: 4 mmol/L (ref 3.5–5.1)
Sodium: 135 mmol/L (ref 135–145)

## 2018-11-25 LAB — CMP (CANCER CENTER ONLY)
ALBUMIN: 3.5 g/dL (ref 3.5–5.0)
ALT: 25 U/L (ref 0–44)
AST: 17 U/L (ref 15–41)
Alkaline Phosphatase: 81 U/L (ref 38–126)
Anion gap: 9 (ref 5–15)
BUN: 31 mg/dL — AB (ref 8–23)
CHLORIDE: 101 mmol/L (ref 98–111)
CO2: 26 mmol/L (ref 22–32)
CREATININE: 2.02 mg/dL — AB (ref 0.61–1.24)
Calcium: 9.8 mg/dL (ref 8.9–10.3)
GFR, Est AFR Am: 35 mL/min — ABNORMAL LOW (ref >60)
GFR, Estimated: 30 mL/min — ABNORMAL LOW (ref >60)
GLUCOSE: 100 mg/dL — AB (ref 70–99)
Potassium: 4.3 mmol/L (ref 3.5–5.1)
SODIUM: 136 mmol/L (ref 135–145)
Total Bilirubin: 1.1 mg/dL (ref 0.3–1.2)
Total Protein: 7.3 g/dL (ref 6.5–8.1)

## 2018-11-25 LAB — URINALYSIS, ROUTINE W REFLEX MICROSCOPIC
Bacteria, UA: NONE SEEN
Bilirubin Urine: NEGATIVE
Glucose, UA: NEGATIVE mg/dL
KETONES UR: NEGATIVE mg/dL
Nitrite: NEGATIVE
Protein, ur: NEGATIVE mg/dL
Specific Gravity, Urine: 1.006 (ref 1.005–1.030)
pH: 7 (ref 5.0–8.0)

## 2018-11-25 LAB — PROTIME-INR
INR: 1.18
Prothrombin Time: 14.9 seconds (ref 11.4–15.2)

## 2018-11-25 LAB — CBC WITH DIFFERENTIAL (CANCER CENTER ONLY)
Abs Immature Granulocytes: 0.09 10*3/uL — ABNORMAL HIGH (ref 0.00–0.07)
Basophils Absolute: 0 10*3/uL (ref 0.0–0.1)
Basophils Relative: 1 %
EOS ABS: 0.7 10*3/uL — AB (ref 0.0–0.5)
Eosinophils Relative: 10 %
HEMATOCRIT: 23.5 % — AB (ref 39.0–52.0)
Hemoglobin: 7.6 g/dL — ABNORMAL LOW (ref 13.0–17.0)
IMMATURE GRANULOCYTES: 1 %
LYMPHS ABS: 1.6 10*3/uL (ref 0.7–4.0)
Lymphocytes Relative: 20 %
MCH: 32.6 pg (ref 26.0–34.0)
MCHC: 32.3 g/dL (ref 30.0–36.0)
MCV: 100.9 fL — AB (ref 80.0–100.0)
MONOS PCT: 8 %
Monocytes Absolute: 0.6 10*3/uL (ref 0.1–1.0)
NEUTROS PCT: 60 %
Neutro Abs: 4.6 10*3/uL (ref 1.7–7.7)
Platelet Count: 54 10*3/uL — ABNORMAL LOW (ref 150–400)
RBC: 2.33 MIL/uL — ABNORMAL LOW (ref 4.22–5.81)
RDW: 19.7 % — ABNORMAL HIGH (ref 11.5–15.5)
WBC Count: 7.7 10*3/uL (ref 4.0–10.5)
nRBC: 0 % (ref 0.0–0.2)

## 2018-11-25 LAB — CBC WITH DIFFERENTIAL/PLATELET
Abs Immature Granulocytes: 0.12 10*3/uL — ABNORMAL HIGH (ref 0.00–0.07)
Basophils Absolute: 0 10*3/uL (ref 0.0–0.1)
Basophils Relative: 1 %
EOS PCT: 10 %
Eosinophils Absolute: 0.7 10*3/uL — ABNORMAL HIGH (ref 0.0–0.5)
HCT: 23.6 % — ABNORMAL LOW (ref 39.0–52.0)
Hemoglobin: 7.5 g/dL — ABNORMAL LOW (ref 13.0–17.0)
Immature Granulocytes: 2 %
Lymphocytes Relative: 24 %
Lymphs Abs: 1.8 10*3/uL (ref 0.7–4.0)
MCH: 32.2 pg (ref 26.0–34.0)
MCHC: 31.8 g/dL (ref 30.0–36.0)
MCV: 101.3 fL — ABNORMAL HIGH (ref 80.0–100.0)
Monocytes Absolute: 0.6 10*3/uL (ref 0.1–1.0)
Monocytes Relative: 8 %
Neutro Abs: 4.1 10*3/uL (ref 1.7–7.7)
Neutrophils Relative %: 55 %
Platelets: 49 10*3/uL — ABNORMAL LOW (ref 150–400)
RBC: 2.33 MIL/uL — ABNORMAL LOW (ref 4.22–5.81)
RDW: 19.5 % — ABNORMAL HIGH (ref 11.5–15.5)
WBC: 7.4 10*3/uL (ref 4.0–10.5)
nRBC: 0 % (ref 0.0–0.2)

## 2018-11-25 LAB — TROPONIN I: Troponin I: <0.03 ng/mL (ref <0.03)

## 2018-11-25 LAB — TYPE AND SCREEN
ABO/RH(D): A POS
Antibody Screen: NEGATIVE

## 2018-11-25 LAB — BRAIN NATRIURETIC PEPTIDE: B Natriuretic Peptide: 2117.1 pg/mL — ABNORMAL HIGH (ref 0.0–100.0)

## 2018-11-25 MED ORDER — FUROSEMIDE 10 MG/ML IJ SOLN
20.0000 mg | Freq: Once | INTRAMUSCULAR | Status: AC
  Administered 2018-11-25: 20 mg via INTRAVENOUS
  Filled 2018-11-25: qty 4

## 2018-11-25 MED ORDER — MIRABEGRON ER 25 MG PO TB24
25.0000 mg | ORAL_TABLET | Freq: Every day | ORAL | Status: DC
  Administered 2018-11-26: 25 mg via ORAL
  Filled 2018-11-25: qty 1

## 2018-11-25 MED ORDER — OXYCODONE HCL 5 MG PO TABS: 5.0000 mg | ORAL_TABLET | ORAL | Status: DC | PRN

## 2018-11-25 MED ORDER — FUROSEMIDE 10 MG/ML IJ SOLN
40.0000 mg | Freq: Two times a day (BID) | INTRAMUSCULAR | Status: DC
  Administered 2018-11-25 – 2018-11-26 (×2): 40 mg via INTRAVENOUS
  Filled 2018-11-25 (×2): qty 4

## 2018-11-25 MED ORDER — SODIUM CHLORIDE 0.9 % IV SOLN
INTRAVENOUS | Status: DC
  Administered 2018-11-25: 18:00:00 via INTRAVENOUS

## 2018-11-25 MED ORDER — DULOXETINE HCL 20 MG PO CPEP
20.0000 mg | ORAL_CAPSULE | Freq: Two times a day (BID) | ORAL | Status: DC
  Administered 2018-11-25 – 2018-11-26 (×2): 20 mg via ORAL
  Filled 2018-11-25 (×2): qty 1

## 2018-11-25 MED ORDER — FAMOTIDINE 20 MG PO TABS
20.0000 mg | ORAL_TABLET | Freq: Two times a day (BID) | ORAL | Status: DC
  Administered 2018-11-25 – 2018-11-26 (×2): 20 mg via ORAL
  Filled 2018-11-25 (×2): qty 1

## 2018-11-25 MED ORDER — CARVEDILOL 3.125 MG PO TABS
3.1250 mg | ORAL_TABLET | Freq: Two times a day (BID) | ORAL | Status: DC
  Administered 2018-11-26: 3.125 mg via ORAL
  Filled 2018-11-25: qty 1

## 2018-11-25 MED ORDER — OXYCODONE-ACETAMINOPHEN 5-325 MG PO TABS: 1.0000 | ORAL_TABLET | ORAL | Status: DC | PRN

## 2018-11-25 MED ORDER — HYDROXYZINE HCL 25 MG PO TABS: 25.0000 mg | ORAL_TABLET | ORAL | Status: DC | PRN

## 2018-11-25 MED ORDER — BOOST HIGH PROTEIN PO LIQD
1.0000 | Freq: Two times a day (BID) | ORAL | Status: DC
  Filled 2018-11-25: qty 237

## 2018-11-25 MED ORDER — PRAMIPEXOLE DIHYDROCHLORIDE 1 MG PO TABS
1.5000 mg | ORAL_TABLET | Freq: Every evening | ORAL | Status: DC
  Administered 2018-11-25: 1.5 mg via ORAL
  Filled 2018-11-25 (×2): qty 2

## 2018-11-25 MED ORDER — OXYCODONE-ACETAMINOPHEN 10-325 MG PO TABS: 1.0000 | ORAL_TABLET | ORAL | Status: DC | PRN

## 2018-11-25 NOTE — ED Triage Notes (Signed)
Pt sent by [place] related to workup for CHF exacerbation; wheezing and blood tinged sputum.

## 2018-11-25 NOTE — ED Notes (Signed)
ED TO INPATIENT HANDOFF REPORT  Name/Age/Gender Charles Coffey 83 y.o. male  Code Status Code Status History    Date Active Date Inactive Code Status Order ID Comments User Context   11/06/2018 2321 11/10/2018 1916 DNR [id number]  Colbert Ewing, MD ED   08/19/2018 0341 08/24/2018 1818 DNR [id number]  Vianne Bulls, MD Inpatient   07/05/2018 0824 07/06/2018 1611 DNR [id number]  Aline August, MD ED   06/30/2018 2318 07/04/2018 2143 Full Code [id number]  Jani Gravel, MD Inpatient   02/17/2018 0045 02/17/2018 2003 Full Code [id number]  Etta Quill, DO ED   01/31/2018 0911 02/05/2018 1516 Full Code [id number]  Phillips Grout, MD Inpatient   11/28/2017 2136 12/04/2017 2050 Full Code [id number]  Eugenie Filler, MD ED   11/25/2017 1510 11/26/2017 1608 Full Code [id number]  Irine Seal, MD Inpatient   08/25/2017 1845 08/26/2017 1906 Full Code [id number]  Wellington Hampshire, MD Inpatient    Questions for Most Recent Historical Code Status (Order [id number])    Question Answer Comment   In the event of cardiac or respiratory ARREST Do not call a "code blue"    In the event of cardiac or respiratory ARREST Do not perform Intubation, CPR, defibrillation or ACLS    In the event of cardiac or respiratory ARREST Use medication by any route, position, wound care, and other measures to relive pain and suffering. May use oxygen, suction and manual treatment of airway obstruction as needed for comfort.         Advance Directive Documentation     Most Recent Value  Type of Advance Directive  Healthcare Power of Attorney, Living will  Pre-existing out of facility DNR order (yellow form or pink MOST form)  -  "MOST" Form in Place?  -      Home/SNF/Other Home  Chief Complaint SOB, Anemia, Cancer Pt  Level of Care/Admitting Diagnosis ED Disposition    ED Disposition Condition [place]: [place] [[id number]]  Level of Care: Telemetry [5]  Admit to tele based on  following criteria: Other see comments  Comments: SOB  Diagnosis: SOB (shortness of breath) [301601]  Admitting Physician: Shela Leff [[id number]]  Attending Physician: Shela Leff [[id number]]  PT Class (Do Not Modify): Observation [104]  PT Acc Code (Do Not Modify): Observation [10022]       Medical History Past Medical History:  Diagnosis Date  . Aortic atherosclerosis ([place])   . BPH with urinary obstruction   . CAD (coronary artery disease)    a.  MI 1995, CABG x 3 2002 (patient says that he had LIMA and RIMA grafts). b. ETT-Cardiolite (10/15) with EF 48%, apical scar, no ischemia. c. Nuc 07/2017 abnormal -> cath was performed,  patent LIMA to LAD and SVG to OM 2. RIMA to RCA is atretic. The right coronary artery is occluded distally with extensive collaterals from the LAD, medical therapy.   . Cancer [facility])    bladder cancer  . Cervical spondylosis without myelopathy Jun 06, 202017  . Chronic low back pain    sees Dr. Kary Kos   . GERD (gastroesophageal reflux disease)   . Hiatal hernia   . Hyperlipidemia   . Hypertension   . IBS (irritable bowel syndrome)   . Ischemic cardiomyopathy   . Myocardial infarction ([place])   . RBBB   . Restless legs syndrome   . Statin intolerance   . Syncope    a. in 2015 - no  apparent cause, was taking sleep medicine at the time. Cardiac workup unremarkable.  . Tubular adenoma of colon     Allergies Allergies  Allergen Reactions  . Statins Other (See Comments)    liver effects  . Cyproheptadine Other (See Comments)    Unable to urinate    IV Location/Drains/Wounds Patient Lines/Drains/Airways Status   Active Line/Drains/Airways    Name:   Placement date:   Placement time:   Site:   Days:   Implanted Port 12/27/17 Right Chest   12/27/17    1630    Chest   333   Colostomy LLQ   -    -    LLQ      Suprapubic Catheter Cuffed Suprapubic 16 Fr.   11/08/18    1617    Cuffed Suprapubic   17   Ureteral Drain/Stent Left ureter 10.2 Fr.    11/08/18    1620    Left ureter   17   Ureteral Drain/Stent Right ureter 10.2 Fr.   11/08/18    1622    Right ureter   17          Labs/Imaging Results for orders placed or performed during the hospital encounter of 11/25/18 (from the past 48 hour(s))  Basic metabolic panel     Status: Abnormal   Collection Time: 11/25/18  4:04 PM  Result Value Ref Range   Sodium 135 135 - 145 mmol/L   Potassium 4.0 3.5 - 5.1 mmol/L   Chloride 100 98 - 111 mmol/L   CO2 25 22 - 32 mmol/L   Glucose, Bld 99 70 - 99 mg/dL   BUN 34 (H) 8 - 23 mg/dL   Creatinine, Ser 2.00 (H) 0.61 - 1.24 mg/dL   Calcium 9.4 8.9 - 10.3 mg/dL   GFR calc non Af Amer 30 (L) >60 mL/min   GFR calc Af Amer 35 (L) >60 mL/min   Anion gap 10 5 - 15    Comment: Performed at [facility], [place] [address], [place], [place] [address]  Troponin I - Once     Status: None   Collection Time: 11/25/18  4:04 PM  Result Value Ref Range   Troponin I <0.03 <0.03 ng/mL    Comment: Performed at [facility], [place] [address], [place], [place] [address]  Protime-INR     Status: None   Collection Time: 11/25/18  4:04 PM  Result Value Ref Range   Prothrombin Time 14.9 11.4 - 15.2 seconds   INR 1.18     Comment: Performed at [facility], [place] [address], [place],  [address]  Urinalysis, Routine w reflex microscopic     Status: Abnormal   Collection Time: 11/25/18  4:17 PM  Result Value Ref Range   Color, Urine STRAW (A) YELLOW   APPearance CLEAR CLEAR   Specific Gravity, Urine 1.006 1.005 - 1.030   pH 7.0 5.0 - 8.0   Glucose, UA NEGATIVE NEGATIVE mg/dL   Hgb urine dipstick SMALL (A) NEGATIVE   Bilirubin Urine NEGATIVE NEGATIVE   Ketones, ur NEGATIVE NEGATIVE mg/dL   Protein, ur NEGATIVE NEGATIVE mg/dL   Nitrite NEGATIVE NEGATIVE   Leukocytes, UA SMALL (A) NEGATIVE   RBC / HPF 0-5 0 - 5 RBC/hpf   WBC, UA 0-5 0 - 5 WBC/hpf   Bacteria, UA NONE SEEN NONE SEEN   Mucus PRESENT      Comment: Performed at [facility], [place] [address]  [place], [place] [address]  Brain natriuretic peptide     Status: Abnormal   Collection Time: 11/25/18  4:34 PM  Result Value Ref Range   B Natriuretic Peptide 2,117.1 (H) 0.0 - 100.0 pg/mL    Comment: Performed at [facility], [place] [address], [place], [place] [address]  CBC with Differential/Platelet     Status: Abnormal   Collection Time: 11/25/18  4:34 PM  Result Value Ref Range   WBC 7.4 4.0 - 10.5 K/uL   RBC 2.33 (L) 4.22 - 5.81 MIL/uL   Hemoglobin 7.5 (L) 13.0 - 17.0 g/dL   HCT 23.6 (L) 39.0 - 52.0 %   MCV 101.3 (H) 80.0 - 100.0 fL   MCH 32.2 26.0 - 34.0 pg   MCHC 31.8 30.0 - 36.0 g/dL   RDW 19.5 (H) 11.5 - 15.5 %   Platelets 49 (L) 150 - 400 K/uL    Comment: Immature Platelet Fraction may be clinically indicated, consider ordering this additional test [id number] PLATELET COUNT CONFIRMED BY SMEAR CORRECTED ON 11/29 AT 1746: PREVIOUSLY REPORTED AS 51 Immature Platelet Fraction may be clinically indicated, consider ordering this additional test [id number]    nRBC 0.0 0.0 - 0.2 %   Neutrophils Relative % 55 %   Neutro Abs 4.1 1.7 - 7.7 K/uL   Lymphocytes Relative 24 %   Lymphs Abs 1.8 0.7 - 4.0 K/uL   Monocytes Relative 8 %   Monocytes Absolute 0.6 0.1 - 1.0 K/uL   Eosinophils Relative 10 %   Eosinophils Absolute 0.7 (H) 0.0 - 0.5 K/uL   Basophils Relative 1 %   Basophils Absolute 0.0 0.0 - 0.1 K/uL   Immature Granulocytes 2 %   Abs Immature Granulocytes 0.12 (H) 0.00 - 0.07 K/uL    Comment: Performed at [facility], [place] [address] [place], [place] [address]   Dg Chest 2 View  Result Date: 11/25/2018 CLINICAL DATA:  History of bladder cancer.  UTI. EXAM: CHEST - 2 VIEW COMPARISON:  Chest x-ray 11/06/2018. FINDINGS: PowerPort catheter with lead tip over the right atrium. Prior CABG. Cardiomegaly with pulmonary venous congestion and bilateral interstitial  prominence consistent with CHF. IMPRESSION: PowerPort catheter with lead tip over the right atrium. Cardiomegaly. Pulmonary venous congestion bilateral from interstitial prominence suggesting CHF. Electronically Signed   By: Marcello Moores  Register   On: 11/25/2018 13:24   EKG Interpretation  Date/Time:  Friday November 25 2018 16:42:46 EST Ventricular Rate:  79 PR Interval:    QRS Duration: 166 QT Interval:  435 QTC Calculation: 499 R Axis:   -136 Text Interpretation:  Sinus rhythm with 1st degree A-V block Multiform ventricular premature complexes Right bundle branch block Anterolateral infarct, age indeterminate When compared with ECG of 11/07/2018 No significant change was found Confirmed by Francine Graven [phone]) on 11/25/2018 4:49:53 PM   Pending Labs Unresulted Labs (From admission, onward)    Start     Ordered   11/25/18 1944  Type and screen  ONCE - STAT,   STAT     11/25/18 1943   11/25/18 1857  Blood gas, arterial  Once,   STAT    Question:  Room air or oxygen  Answer:  Room air   11/25/18 1856   11/25/18 1849  Sample to Blood Bank  ONCE - STAT,   STAT     11/25/18 1848   11/25/18 1632  CBC with Differential/Platelet  Once,   R     11/25/18 1632   11/25/18 1531  CBC with Differential  Once,   STAT     11/25/18 1531   11/25/18 1531  Urine culture  ONCE - STAT,   STAT     11/25/18 1531          Vitals/Pain Today's Vitals   11/25/18 1730 11/25/18 1815 11/25/18 1830 11/25/18 1900  BP: (!) 116/58 113/82 103/69 104/74  Pulse: 82 68 72 87  Resp: 20 20 (!) 25 (!) 23  Temp:      TempSrc:      SpO2: 97% 100% 95% 100%  PainSc:        Isolation Precautions No active isolations  Medications Medications  0.9 %  sodium chloride infusion ( Intravenous New Bag/Given 11/25/18 1738)  furosemide (LASIX) injection 20 mg (20 mg Intravenous Given 11/25/18 1902)    Mobility walks with device

## 2018-11-25 NOTE — ED Provider Notes (Signed)
[facility] DEPT Provider Note   CSN: [id number] Arrival date & time: 11/25/18  1452     History   Chief Complaint Chief Complaint  Patient presents with  . Shortness of Breath    HPI Charles Coffey is a 82 y.o. male.  HPI  Pt was seen at 1520. Per pt and his wife, c/o gradual onset and worsening of persistent SOB and generalized weakness/fatigue for the past 5 days. Has been associated with "wheezing," coughing, and pedal edema. Pt was evaluated at the [facility] P[facility], then sent to the ED for further evaluation. Denies fevers, no rash, no abd pain, no N/V/D, no CP/palpitations, no back pain.    Past Medical History:  Diagnosis Date  . Aortic atherosclerosis ([place])   . BPH with urinary obstruction   . CAD (coronary artery disease)    a.  MI 1995, CABG x 3 2002 (patient says that he had LIMA and RIMA grafts). b. ETT-Cardiolite (10/15) with EF 48%, apical scar, no ischemia. c. Nuc 07/2017 abnormal -> cath was performed,  patent LIMA to LAD and SVG to OM 2. RIMA to RCA is atretic. The right coronary artery is occluded distally with extensive collaterals from the LAD, medical therapy.   . Cancer [facility])    bladder cancer  . Cervical spondylosis without myelopathy 18-Jun-202017  . Chronic low back pain    sees Dr. Kary Kos   . GERD (gastroesophageal reflux disease)   . Hiatal hernia   . Hyperlipidemia   . Hypertension   . IBS (irritable bowel syndrome)   . Ischemic cardiomyopathy   . Myocardial infarction ([place])   . RBBB   . Restless legs syndrome   . Statin intolerance   . Syncope    a. in 2015 - no apparent cause, was taking sleep medicine at the time. Cardiac workup unremarkable.  . Tubular adenoma of colon     Patient Active Problem List   Diagnosis Date Noted  . Moderate episode of recurrent major depressive disorder ([place])   . Bacterial infection due to Proteus mirabilis 11/09/2018  . Sepsis ([place]) 11/09/2018  . AKI (acute kidney  injury) ([place]) 11/09/2018  . UTI (urinary tract infection) 11/06/2018  . Cancer associated pain   . Palliative care by specialist   . DNR (do not resuscitate)   . Agitation   . Sepsis due to urinary tract infection ([place]) 08/19/2018  . Hyponatremia 08/19/2018  . Ventral hernia without obstruction or gangrene 07/21/2018  . Displacement of nephrostomy tube ([place]) 07/05/2018  . Nephrostomy tube displaced ([place]) 07/05/2018  . Malfunction of nephrostomy tube ([place])   . Lower urinary tract infectious disease 06/30/2018  . Port-A-Cath in place 03/31/2018  . PVC (premature ventricular contraction) 02/25/2018  . Malignant neoplasm of urinary bladder ([place]) 02/25/2018  . Catheter-associated urinary tract infection ([place]) 02/17/2018  . Nephrostomy complication ([place]) 10/62/6948  . Chronic systolic CHF (congestive heart failure) ([place]) 02/07/2018  . Malnutrition of moderate degree 02/01/2018  . Acute respiratory failure with hypoxia ([place]) 01/31/2018  . Acute CHF (congestive heart failure) ([place]) 01/31/2018  . Thrombocytopathia ([place])   . DVT (deep venous thrombosis) ([place]) 01/11/2018  . Sepsis secondary to UTI ([place]) 01/01/2018  . Colostomy in place [facility]) 12/04/2017  . Rectal stenosis   . Low vitamin B12 level 11/30/2017  . Irritable bowel syndrome with constipation   . Pyelonephritis 11/28/2017  . Fever 11/28/2017  . Ileus ([place]) 11/28/2017  . Elevated troponin 11/28/2017  . Urothelial carcinoma  of bladder ([place]) 11/28/2017  . Dehydration 11/28/2017  . Thrombocytopenia ([place]) 11/28/2017  . Hydronephrosis due to obstructive malignant neoplasm of bladder ([place]) 11/25/2017  . Anemia 09/20/2017  . Chronic kidney disease, stage 3 ([place]) 09/20/2017  . Angina pectoris ([place])   . Abnormal stress test   . Cervical spondylosis without myelopathy 10-31-202017  . PAD (peripheral artery disease) ([place]) 03/04/2015  . Coronary artery disease involving native heart without angina pectoris 11/01/2014  . Acid reflux  11/01/2014  . H/O male genital system disorder 11/01/2014  . Restless leg 11/01/2014  . Acne erythematosa 11/01/2014  . Syncope 09/24/2014  . Chronic low back pain 11/10/2013  . Cardiomyopathy, ischemic 11/29/2012  . Hyperlipidemia LDL goal <70 10/15/2010  . RESTLESS LEGS SYNDROME 10/15/2010  . Essential hypertension 10/15/2010  . MYOCARDIAL INFARCTION, HX OF 10/15/2010  . Coronary artery disease of native heart with stable angina pectoris ([place]) 10/15/2010  . GERD 10/15/2010  . BENIGN PROSTATIC HYPERTROPHY 10/15/2010  . BENIGN PROSTATIC HYPERTROPHY, WITH OBSTRUCTION 10/15/2010  . COLONIC POLYPS, HX OF 10/15/2010    Past Surgical History:  Procedure Laterality Date  . COLONOSCOPY  10/17/2014   per Dr. Hilarie Fredrickson, tubular adenomas, repeat in 3 yrs   . COLONOSCOPY WITH PROPOFOL N/A 12/01/2017   Procedure: COLONOSCOPY WITH PROPOFOL;  Surgeon: Milus Banister, MD;  Location: [facility] END[facility]COPY;  Service: Endoscopy;  Laterality: N/A;  . CYSTOSCOPY W/ RETROGRADES Left 11/25/2017   Procedure: CYSTOSCOPY WITH RETROGRADE PYELOGRAM;  Surgeon: Irine Seal, MD;  Location: [facility] ORS;  Service: Urology;  Laterality: Left;  . HEART BYPASS    . IR CATHETER TUBE CHANGE  11/08/2018  . IR CONVERT LEFT NEPHROSTOMY TO NEPHROURETERAL CATH  09/20/2018  . IR CONVERT RIGHT NEPHROSTOMY TO NEPHROURETERAL CATH  02/04/2018  . IR CONVERT RIGHT NEPHROSTOMY TO NEPHROURETERAL CATH  08/23/2018  . IR EXT NEPHROURETERAL CATH EXCHANGE  04/11/2018  . IR EXT NEPHROURETERAL CATH EXCHANGE  07/04/2018  . IR EXT NEPHROURETERAL CATH EXCHANGE  09/20/2018  . IR EXT NEPHROURETERAL CATH EXCHANGE  11/08/2018  . IR EXT NEPHROURETERAL CATH EXCHANGE  11/08/2018  . IR FLUORO GUIDE CV LINE RIGHT  12/27/2017  . IR FLUORO GUIDED NEEDLE PLC ASPIRATION/INJECTION LOC  08/19/2018  . IR NEPHROSTOGRAM LEFT THRU EXISTING ACCESS  12/24/2017  . IR NEPHROSTOMY EXCHANGE LEFT  01/28/2018  . IR NEPHROSTOMY EXCHANGE LEFT  02/04/2018  . IR NEPHROSTOMY EXCHANGE LEFT   02/25/2018  . IR NEPHROSTOMY EXCHANGE LEFT  07/04/2018  . IR NEPHROSTOMY EXCHANGE LEFT  07/05/2018  . IR NEPHROSTOMY EXCHANGE LEFT  08/09/2018  . IR NEPHROSTOMY EXCHANGE LEFT  08/16/2018  . IR NEPHROSTOMY EXCHANGE LEFT  08/23/2018  . IR NEPHROSTOMY EXCHANGE RIGHT  12/24/2017  . IR NEPHROSTOMY EXCHANGE RIGHT  01/28/2018  . IR NEPHROSTOMY EXCHANGE RIGHT  04/11/2018  . IR NEPHROSTOMY EXCHANGE RIGHT  07/05/2018  . IR NEPHROSTOMY EXCHANGE RIGHT  08/09/2018  . IR NEPHROSTOMY EXCHANGE RIGHT  08/19/2018  . IR NEPHROSTOMY PLACEMENT LEFT  11/28/2017  . IR NEPHROSTOMY PLACEMENT RIGHT  12/03/2017  . IR PATIENT EVAL TECH 0-60 MINS  03/24/2018  . IR PATIENT EVAL TECH 0-60 MINS  03/28/2018  . IR PATIENT EVAL TECH 0-60 MINS  04/25/2018  . IR PATIENT EVAL TECH 0-60 MINS  06/20/2018  . IR US GUIDE VASC ACCESS RIGHT  12/27/2017  . LAPAROSCOPY N/A 12/01/2017   Procedure: LAPAROSCOPIC DIVERTING OSTOMY;  Surgeon: Stark Klein, MD;  Location: [facility];  Service: General;  Laterality: N/A;  . LEFT HEART CATH AND  CORS/GRAFTS ANGIOGRAPHY N/A 08/25/2017   Procedure: LEFT HEART CATH AND CORS/GRAFTS ANGIOGRAPHY;  Surgeon: Wellington Hampshire, MD;  Location: [facility] CV LAB;  Service: Cardiovascular;  Laterality: N/A;  . LUMBAR FUSION  2003   L3-L4  . PROSTATE SURGERY  06-27-12   per Dr. Roni Bread, had CTT  . TONSILLECTOMY    . TRANSURETHRAL RESECTION OF BLADDER TUMOR N/A 11/25/2017   Procedure: TRANSURETHRAL RESECTION OF BLADDER TUMOR (TURBT);  Surgeon: Irine Seal, MD;  Location: [facility] OR[facility];  Service: Urology;  Laterality: N/A;        Home Medications    Prior to Admission medications   Medication Sig Start Date End Date Taking? Authorizing Provider  carvedilol (COREG) 3.125 MG tablet Take 3.125 mg by mouth 2 (two) times daily with a meal.    [provider]  DULoxetine (CYMBALTA) 20 MG capsule Take 1 capsule (20 mg total) by mouth 2 (two) times daily. Take once daily for 2 weeks then increase to twice daily 11/10/18   Eugenie Filler, MD  famotidine (PEPCID) 20 MG tablet Take 1 tablet (20 mg total) by mouth 2 (two) times daily. 10/18/18   Wyatt Portela, MD  feeding supplement (BOOST HIGH PROTEIN) LIQD Take 1 Container by mouth 2 (two) times daily between meals.    [provider]  furosemide (LASIX) 40 MG tablet Take 1 tablet (40 mg total) by mouth daily as needed for fluid or edema. 11/10/18   Eugenie Filler, MD  hydrOXYzine (ATARAX/VISTARIL) 25 MG tablet Take 1 tablet (25 mg total) by mouth every 4 (four) hours as needed for itching. 11/17/18   [name], MD  lidocaine-prilocaine (EMLA) cream Apply 1 application topically as needed. 10/18/18   Wyatt Portela, MD  mirabegron ER (MYRBETRIQ) 25 MG TB24 tablet Take 25 mg by mouth daily.    [provider]  nitroGLYCERIN (NITROSTAT) 0.4 MG SL tablet PLACE 1 TABLET (0.4 MG TOTAL) UNDER THE TONGUE EVERY 5 (FIVE) MINUTES AS NEEDED. 09/27/18 12/26/18  Dunn, Lisbeth Renshaw N, PA-C  OLANZapine (ZYPREXA) 5 MG tablet Take 0.5 tablets (2.5 mg total) by mouth at bedtime. 10/10/18   Tanner, Lyndon Code., PA-C  oxyCODONE (OXYCONTIN) 10 mg 12 hr tablet Take 1 tablet (10 mg total) by mouth at bedtime. 11/10/18   Eugenie Filler, MD  oxyCODONE-acetaminophen (PERCOCET) 10-325 MG tablet Take 1 tablet by mouth every 4 (four) hours as needed for pain. 11/15/18   Wyatt Portela, MD  polyvinyl alcohol (LIQUIFILM TEARS) 1.4 % ophthalmic solution Place 1 drop into both eyes 2 (two) times daily as needed (dry eyes). 08/24/18   Nita Sells, MD  pramipexole (MIRAPEX) 1 MG tablet TAKE 1 AND 1/2 TABLETS BY MOUTH AT BEDTIME Patient taking differently: Take 1.5 mg by mouth every evening.  06/29/18   [name], MD  triamcinolone cream (KENALOG) 0.1 % Apply topically 3 (three) times daily. To affected areas 07/04/18   Donne Hazel, MD    Family History Family History  Problem Relation Age of Onset  . Heart attack Mother   . Aneurysm Father        femoral artery  .  Heart disease Brother   . Heart disease Maternal Uncle        x 2  . Colon cancer Neg Hx   . Esophageal cancer Neg Hx   . Pancreatic cancer Neg Hx   . Kidney disease Neg Hx   . Liver disease Neg Hx  Social History Social History   Tobacco Use  . Smoking status: Former Smoker    Types: Cigarettes    Last attempt to quit: 12/28/1978    Years since quitting: 39.9  . Smokeless tobacco: Never Used  Substance Use Topics  . Alcohol use: Not Currently    Alcohol/week: 2.0 standard drinks    Types: 1 Glasses of wine, 1 Cans of beer per week    Comment: occassional  . Drug use: No     Allergies   Statins and Cyproheptadine   Review of Systems Review of Systems ROS: Statement: All systems negative except as marked or noted in the HPI; Constitutional: Negative for fever and chills. +generalized weakness/fatigue.; ; Eyes: Negative for eye pain, redness and discharge. ; ; ENMT: Negative for ear pain, hoarseness, nasal congestion, sinus pressure and sore throat. ; ; Cardiovascular: Negative for chest pain, palpitations, diaphoresis, +dyspnea and peripheral edema. ; ; Respiratory: +cough, wheezing. Negative for stridor. ; ; Gastrointestinal: Negative for nausea, vomiting, diarrhea, abdominal pain, blood in stool, hematemesis, jaundice and rectal bleeding. . ; ; Genitourinary: Negative for dysuria, flank pain and hematuria. ; ; Musculoskeletal: Negative for back pain and neck pain. Negative for swelling and trauma.; ; Skin: Negative for pruritus, rash, abrasions, blisters, bruising and skin lesion.; ; Neuro: Negative for headache, lightheadedness and neck stiffness. Negative for weakness, altered level of consciousness, altered mental status, extremity weakness, paresthesias, involuntary movement, seizure and syncope.       Physical Exam Updated Vital Signs BP 124/82 (BP Location: Right Arm)   Pulse 76   Temp 97.8 F (36.6 C) (Oral)   Resp 18   SpO2 100%   Physical  Exam 1525: Physical examination:  Nursing notes reviewed; Vital signs and O2 SAT reviewed;  Constitutional: Well developed, Well nourished, Well hydrated, In no acute distress; Head:  Normocephalic, atraumatic; Eyes: EOMI, PERRL, No scleral icterus; ENMT: Mouth and pharynx normal, Mucous membranes moist; Neck: Supple, Full range of motion, No lymphadenopathy; Cardiovascular: Regular rate and rhythm, No gallop; Respiratory: Breath sounds coarse & equal bilaterally, No wheezes.  Speaking full sentences, Normal respiratory effort/excursion; Chest: Nontender, Movement normal; Abdomen: Soft, Nontender, Nondistended, Normal bowel sounds; Genitourinary: No CVA tenderness; Extremities: Peripheral pulses normal, No tenderness, +1 RLE edema, +left calf 2+ edema with asymmetry.; Neuro: AA&Ox3, Major CN grossly intact. No facial droop. Speech clear. No gross focal motor deficits in extremities.; Skin: Color pale, Warm, Dry.   ED Treatments / Results  Labs (all labs ordered are listed, but only abnormal results are displayed)   EKG EKG Interpretation  Date/Time:  Friday November 25 2018 16:42:46 EST Ventricular Rate:  79 PR Interval:    QRS Duration: 166 QT Interval:  435 QTC Calculation: 499 R Axis:   -136 Text Interpretation:  Sinus rhythm with 1st degree A-V block Multiform ventricular premature complexes Right bundle branch block Anterolateral infarct, age indeterminate When compared with ECG of 11/07/2018 No significant change was found Confirmed by Francine Graven [phone]) on 11/25/2018 4:49:53 PM   Radiology   Procedures Procedures (including critical care time)  Medications Ordered in ED Medications - No data to display   Initial Impression / Assessment and Plan / ED Course  I have reviewed the triage vital signs and the nursing notes.  Pertinent labs & imaging results that were available during my care of the patient were reviewed by me and considered in my medical decision making  (see chart for details).  MDM Reviewed: previous chart, nursing note and  vitals Reviewed previous: labs, ECG, ultrasound and x-ray Interpretation: labs, ECG and ultrasound    Dg Chest 2 View Result Date: 11/25/2018 CLINICAL DATA:  History of bladder cancer.  UTI. EXAM: CHEST - 2 VIEW COMPARISON:  Chest x-ray 11/06/2018. FINDINGS: PowerPort catheter with lead tip over the right atrium. Prior CABG. Cardiomegaly with pulmonary venous congestion and bilateral interstitial prominence consistent with CHF. IMPRESSION: PowerPort catheter with lead tip over the right atrium. Cardiomegaly. Pulmonary venous congestion bilateral from interstitial prominence suggesting CHF. Electronically Signed   By: Marcello Moores  Register   On: 11/25/2018 13:24    Results for orders placed or performed during the hospital encounter of 03/17/21  Basic metabolic panel  Result Value Ref Range   Sodium 135 135 - 145 mmol/L   Potassium 4.0 3.5 - 5.1 mmol/L   Chloride 100 98 - 111 mmol/L   CO2 25 22 - 32 mmol/L   Glucose, Bld 99 70 - 99 mg/dL   BUN 34 (H) 8 - 23 mg/dL   Creatinine, Ser 2.00 (H) 0.61 - 1.24 mg/dL   Calcium 9.4 8.9 - 10.3 mg/dL   GFR calc non Af Amer 30 (L) >60 mL/min   GFR calc Af Amer 35 (L) >60 mL/min   Anion gap 10 5 - 15  Troponin I - Once  Result Value Ref Range   Troponin I <0.03 <0.03 ng/mL  Protime-INR  Result Value Ref Range   Prothrombin Time 14.9 11.4 - 15.2 seconds   INR 1.18   Urinalysis, Routine w reflex microscopic  Result Value Ref Range   Color, Urine STRAW (A) YELLOW   APPearance CLEAR CLEAR   Specific Gravity, Urine 1.006 1.005 - 1.030   pH 7.0 5.0 - 8.0   Glucose, UA NEGATIVE NEGATIVE mg/dL   Hgb urine dipstick SMALL (A) NEGATIVE   Bilirubin Urine NEGATIVE NEGATIVE   Ketones, ur NEGATIVE NEGATIVE mg/dL   Protein, ur NEGATIVE NEGATIVE mg/dL   Nitrite NEGATIVE NEGATIVE   Leukocytes, UA SMALL (A) NEGATIVE   RBC / HPF 0-5 0 - 5 RBC/hpf   WBC, UA 0-5 0 - 5 WBC/hpf   Bacteria,  UA NONE SEEN NONE SEEN   Mucus PRESENT   Brain natriuretic peptide  Result Value Ref Range   B Natriuretic Peptide 2,117.1 (H) 0.0 - 100.0 pg/mL  CBC with Differential/Platelet  Result Value Ref Range   WBC 7.4 4.0 - 10.5 K/uL   RBC 2.33 (L) 4.22 - 5.81 MIL/uL   Hemoglobin 7.5 (L) 13.0 - 17.0 g/dL   HCT 23.6 (L) 39.0 - 52.0 %   MCV 101.3 (H) 80.0 - 100.0 fL   MCH 32.2 26.0 - 34.0 pg   MCHC 31.8 30.0 - 36.0 g/dL   RDW 19.5 (H) 11.5 - 15.5 %   Platelets 49 (L) 150 - 400 K/uL   nRBC 0.0 0.0 - 0.2 %   Neutrophils Relative % 55 %   Neutro Abs 4.1 1.7 - 7.7 K/uL   Lymphocytes Relative 24 %   Lymphs Abs 1.8 0.7 - 4.0 K/uL   Monocytes Relative 8 %   Monocytes Absolute 0.6 0.1 - 1.0 K/uL   Eosinophils Relative 10 %   Eosinophils Absolute 0.7 (H) 0.0 - 0.5 K/uL   Basophils Relative 1 %   Basophils Absolute 0.0 0.0 - 0.1 K/uL   Immature Granulocytes 2 %   Abs Immature Granulocytes 0.12 (H) 0.00 - 0.07 K/uL    Left lower extremity venous duplex completed. Preliminary results - There is  no evidence of a DVT or Baker's cyst. Eligah East 11/25/2018, 6:29 PM  Results for JUANDAVID, DALLMAN (MRN [id number]) as of 11/25/2018 19:39  Ref. Range 11/07/2018 01:23 11/08/2018 04:17 11/09/2018 04:06 11/10/2018 04:37 11/14/2018 16:56 11/25/2018 13:35 11/25/2018 16:34  Hemoglobin Latest Ref Range: 13.0 - 17.0 g/dL 7.2 (L) 7.6 (L) 8.1 (L) 8.0 (L) 8.2 (L) 7.6 (L) 7.5 (L)  HCT Latest Ref Range: 39.0 - 52.0 % 23.3 (L) 24.2 (L) 25.8 (L) 25.1 (L) 24.4 (L) 23.5 (L) 23.6 (L)  Platelets Latest Ref Range: 150 - 400 K/uL 61 (L) 53 (L) 60 (L) 49 (L) 64 (LL) 54 (L) 49 (L)     Results for SHOICHI, MIELKE (MRN [id number]) as of 11/25/2018 18:43  Ref. Range 01/31/2018 01:53 11/25/2018 16:34  B Natriuretic Peptide Latest Ref Range: 0.0 - 100.0 pg/mL 1,472.0 (H) 2,117.1 (H)   Results for KINGJAMES, COURY (MRN [id number]) as of 11/25/2018 18:43  Ref. Range 11/09/2018 04:06 11/10/2018 04:37 11/14/2018  16:56 11/25/2018 13:35 11/25/2018 16:04  BUN Latest Ref Range: 8 - 23 mg/dL 37 (H) 30 (H) 35 (H) 31 (H) 34 (H)  Creatinine Latest Ref Range: 0.61 - 1.24 mg/dL 1.88 (H) 1.81 (H) 1.87 (H) 2.02 (H) 2.00 (H)     1935:  BUN/Cr mildly elevated and pt is orthostatic on VS; judicious IVF given. H/H within pt's baseline range. BNP elevated with CHF on CXR; pt will need admission for gentle diuresis. Dx and testing d/w pt and family.  Questions answered.  Verb understanding, agreeable to admit.  T/C returned from [facility] Dr. Marlowe Sax, case discussed, including:  HPI, pertinent PM/SHx, VS/PE, dx testing, ED course and treatment:  Agreeable to admit.       Final Clinical Impressions(s) / ED Diagnoses   Final diagnoses:  None    ED Discharge Orders    None       Francine Graven, DO 11/28/18 0801

## 2018-11-25 NOTE — Progress Notes (Signed)
[facility]pt[facility] Progress Note   Charles Coffey [id number] 03-19-1935 83 y.o.  Charles Coffey is managed by Dr. Zola Button  Actively treated with chemotherapy/immunotherapy/hormonal therapy: no  Assessment: Plan:    Acute on chronic congestive heart failure, unspecified heart failure type ([place])  Anemia, unspecified type  Malignant neoplasm of urinary bladder, unspecified site [facility])   Acute on chronic congestive heart failure: The patient presents with increasing fatigue, wheezing, and a productive cough with pinkish sputum.  He was referred for a chest x-ray which showed: PowerPort catheter with lead tip over the right atrium. Cardiomegaly. Pulmonary venous congestion bilateral from interstitial prominence suggesting CHF.  This case was discussed with Dr. [name] who agreed with my plan to transport the patient to the emergency room for management.  Anemia: Anemia a CBC returned today with a hemoglobin lower at 7.6 and a hematocrit of 23.5.  I discussed with the patient and his wife that his anemia could in part play into his CHF exacerbation.  Metastatic malignant neoplasm of the urinary bladder: The patient continues to be followed by Dr. [name].  He is currently being managed with conservative watchful waiting.  He is recently enrolled in home health palliative care.  Please see After Visit Summary for patient specific instructions.  Future Appointments  Date Time Provider [place]  11/29/2018  3:00 PM Cameron Sprang, MD [facility] None  12/01/2018  2:00 PM End, Harrell Gave, MD [facility]  12/26/2018 12:00 PM [facility] ROOM [facility] None  12/26/2018  1:30 PM [facility] 1 [facility] [place]  01/13/2019  3:00 PM [facility] [place] FL[place] [facility] None  03/17/2019  9:15 AM [facility] LAB 5 [facility] None  03/17/2019  9:30 AM [facility] [place] [facility] None  03/17/2019 10:00 AM Shadad, Mathis Dad, MD [facility] None    No orders of the defined types  were placed in this encounter.      Subjective:   Patient ID:  Charles Coffey is a 83 y.o. (DOB 01-09-1935) male.  Chief Complaint:  Chief Complaint  Patient presents with  . Fatigue    HPI Charles Coffey   is an 83 year old male with a history of a metastatic bladder cancer who is followed by Dr. [name].  He is currently being managed with watchful waiting.  He has recently enrolled on home health palliative care.  This office was contacted by the patient's wife and by the home health palliative care nurse today stating that the patient was having increasing fatigue, increased somnolence, and bilateral lower extremity edema despite being on Lasix.  The symptoms have been increasing over the last 3-day's.  He has also been having wheezing in his chest and was noted to have a productive cough with pinkish sputum today.  His urine is darker in color.  He continues to have itching which has been an ongoing problem.  His wife reports that the patient has had ongoing anorexia and has been eating whenever he can.     Medications: I have reviewed the patient's current medications.  Allergies:  Allergies  Allergen Reactions  . Statins Other (See Comments)    liver effects  . Cyproheptadine Other (See Comments)    Unable to urinate    Past Medical History:  Diagnosis Date  . Aortic atherosclerosis ([place])   . BPH with urinary obstruction   . CAD (coronary artery disease)    a.  MI 1995, CABG x 3 2002 (patient says that he had LIMA and RIMA grafts).  b. ETT-Cardiolite (10/15) with EF 48%, apical scar, no ischemia. c. Nuc 07/2017 abnormal -> cath was performed,  patent LIMA to LAD and SVG to OM 2. RIMA to RCA is atretic. The right coronary artery is occluded distally with extensive collaterals from the LAD, medical therapy.   . Cancer [facility])    bladder cancer  . Cervical spondylosis without myelopathy 07-23-202017  . Chronic low back pain    sees Dr. Kary Kos   . GERD (gastroesophageal  reflux disease)   . Hiatal hernia   . Hyperlipidemia   . Hypertension   . IBS (irritable bowel syndrome)   . Ischemic cardiomyopathy   . Myocardial infarction ([place])   . RBBB   . Restless legs syndrome   . Statin intolerance   . Syncope    a. in 2015 - no apparent cause, was taking sleep medicine at the time. Cardiac workup unremarkable.  . Tubular adenoma of colon     Past Surgical History:  Procedure Laterality Date  . COLONOSCOPY  10/17/2014   per Dr. Hilarie Fredrickson, tubular adenomas, repeat in 3 yrs   . COLONOSCOPY WITH PROPOFOL N/A 12/01/2017   Procedure: COLONOSCOPY WITH PROPOFOL;  Surgeon: Milus Banister, MD;  [facility];  Service: Endoscopy;  Laterality: N/A;  . CYSTOSCOPY W/ RETROGRADES Left 11/25/2017   Procedure: CYSTOSCOPY WITH RETROGRADE PYELOGRAM;  Surgeon: Irine Seal, MD;  Location[facility];  Service: Urology;  Laterality: Left;  . HEART BYPASS    . IR CATHETER TUBE CHANGE  11/08/2018  . IR CONVERT LEFT NEPHROSTOMY TO NEPHROURETERAL CATH  09/20/2018  . IR CONVERT RIGHT NEPHROSTOMY TO NEPHROURETERAL CATH  02/04/2018  . IR CONVERT RIGHT NEPHROSTOMY TO NEPHROURETERAL CATH  08/23/2018  . IR EXT NEPHROURETERAL CATH EXCHANGE  04/11/2018  . IR EXT NEPHROURETERAL CATH EXCHANGE  07/04/2018  . IR EXT NEPHROURETERAL CATH EXCHANGE  09/20/2018  . IR EXT NEPHROURETERAL CATH EXCHANGE  11/08/2018  . IR EXT NEPHROURETERAL CATH EXCHANGE  11/08/2018  . IR FLUORO GUIDE CV LINE RIGHT  12/27/2017  . IR FLUORO GUIDED NEEDLE PLC ASPIRATION/INJECTION LOC  08/19/2018  . IR NEPHROSTOGRAM LEFT THRU EXISTING ACCESS  12/24/2017  . IR NEPHROSTOMY EXCHANGE LEFT  01/28/2018  . IR NEPHROSTOMY EXCHANGE LEFT  02/04/2018  . IR NEPHROSTOMY EXCHANGE LEFT  02/25/2018  . IR NEPHROSTOMY EXCHANGE LEFT  07/04/2018  . IR NEPHROSTOMY EXCHANGE LEFT  07/05/2018  . IR NEPHROSTOMY EXCHANGE LEFT  08/09/2018  . IR NEPHROSTOMY EXCHANGE LEFT  08/16/2018  . IR NEPHROSTOMY EXCHANGE LEFT  08/23/2018  . IR NEPHROSTOMY EXCHANGE RIGHT   12/24/2017  . IR NEPHROSTOMY EXCHANGE RIGHT  01/28/2018  . IR NEPHROSTOMY EXCHANGE RIGHT  04/11/2018  . IR NEPHROSTOMY EXCHANGE RIGHT  07/05/2018  . IR NEPHROSTOMY EXCHANGE RIGHT  08/09/2018  . IR NEPHROSTOMY EXCHANGE RIGHT  08/19/2018  . IR NEPHROSTOMY PLACEMENT LEFT  11/28/2017  . IR NEPHROSTOMY PLACEMENT RIGHT  12/03/2017  . IR PATIENT EVAL TECH 0-60 MINS  03/24/2018  . IR PATIENT EVAL TECH 0-60 MINS  03/28/2018  . IR PATIENT EVAL TECH 0-60 MINS  04/25/2018  . IR PATIENT EVAL TECH 0-60 MINS  06/20/2018  . IR US GUIDE VASC ACCESS RIGHT  12/27/2017  . LAPAROSCOPY N/A 12/01/2017   Procedure: LAPAROSCOPIC DIVERTING OSTOMY;  Surgeon: Stark Klein, MD;  Location[facility];  Service: General;  Laterality: N/A;  . LEFT HEART CATH AND CORS/GRAFTS ANGIOGRAPHY N/A 08/25/2017   Procedure: LEFT HEART CATH AND CORS/GRAFTS ANGIOGRAPHY;  Surgeon: Wellington Hampshire,  MD;  Location: [place] CV LAB;  Service: Cardiovascular;  Laterality: N/A;  . LUMBAR FUSION  2003   L3-L4  . PROSTATE SURGERY  06-27-12   per Dr. Roni Bread, had CTT  . TONSILLECTOMY    . TRANSURETHRAL RESECTION OF BLADDER TUMOR N/A 11/25/2017   Procedure: TRANSURETHRAL RESECTION OF BLADDER TUMOR (TURBT);  Surgeon: Irine Seal, MD;  Location[facility];  Service: Urology;  Laterality: N/A;    Family History  Problem Relation Age of Onset  . Heart attack Mother   . Aneurysm Father        femoral artery  . Heart disease Brother   . Heart disease Maternal Uncle        x 2  . Colon cancer Neg Hx   . Esophageal cancer Neg Hx   . Pancreatic cancer Neg Hx   . Kidney disease Neg Hx   . Liver disease Neg Hx     Social History   Socioeconomic History  . Marital status: Married    Spouse name: Not on file  . Number of children: 5  . Years of education: Some coll  . Highest education level: Not on file  Occupational History  . Occupation: retired  Scientific laboratory technician  . Financial resource strain: Not on file  . Food insecurity:    Worry: Not on file     Inability: Not on file  . Transportation needs:    Medical: Not on file    Non-medical: Not on file  Tobacco Use  . Smoking status: Former Smoker    Types: Cigarettes    Last attempt to quit: 12/28/1978    Years since quitting: 39.9  . Smokeless tobacco: Never Used  Substance and Sexual Activity  . Alcohol use: Not Currently    Alcohol/week: 2.0 standard drinks    Types: 1 Glasses of wine, 1 Cans of beer per week    Comment: occassional  . Drug use: No  . Sexual activity: Not Currently  Lifestyle  . Physical activity:    Days per week: Not on file    Minutes per session: Not on file  . Stress: Not on file  Relationships  . Social connections:    Talks on phone: Not on file    Gets together: Not on file    Attends religious service: Not on file    Active member of club or organization: Not on file    Attends meetings of clubs or organizations: Not on file    Relationship status: Not on file  . Intimate partner violence:    Fear of current or ex partner: Not on file    Emotionally abused: Not on file    Physically abused: Not on file    Forced sexual activity: Not on file  Other Topics Concern  . Not on file  Social History Narrative   Lives at home w/ his wife   Right-handed   5 cups of coffee per day    Past Medical History, Surgical history, Social history, and Family history were reviewed and updated as appropriate.   Please see review of systems for further details on the patient's review from today.   Review of Systems:  Review of Systems  Constitutional: Positive for appetite change and fatigue. Negative for chills, diaphoresis and fever.  HENT: Negative for trouble swallowing and voice change.   Respiratory: Positive for cough. Negative for chest tightness, shortness of breath and wheezing.   Cardiovascular: Positive for leg swelling. Negative for chest  pain and palpitations.  Gastrointestinal: Negative for abdominal pain, constipation, diarrhea, nausea and  vomiting.  Musculoskeletal: Negative for back pain and myalgias.  Neurological: Negative for dizziness, light-headedness and headaches.  Psychiatric/Behavioral: Positive for sleep disturbance.    Objective:   Physical Exam:  BP 105/68 (BP Location: Left Arm, Patient Position: Sitting)   Pulse 92   Temp 97.8 F (36.6 C) (Oral)   Resp 16   Ht 5\' 8"  (1.727 m)   Wt 185 lb 6.4 oz (84.1 kg)   SpO2 100%   BMI 28.19 kg/m  ECOG: 1  Physical Exam  Constitutional: He appears lethargic. No distress.  HENT:  Head: Normocephalic and atraumatic.  Cardiovascular: Normal rate, regular rhythm and normal heart sounds. Exam reveals no gallop and no friction rub.  No murmur heard. Pulmonary/Chest: Effort normal. No respiratory distress. He has no wheezes.  Bibasilar crackles.  Musculoskeletal: He exhibits edema (1+ pitting edema to the knees.).  Neurological: He appears lethargic. Coordination (The patient is ambulating with the use of a wheelchair.) abnormal.  Skin: Skin is warm and dry. No rash noted. He is not diaphoretic. No erythema.    Lab Review:     Component Value Date/Time   NA 136 11/25/2018 1335   NA 137 11/14/2018 1656   NA 133 (L) 12/29/2017 1014   K 4.3 11/25/2018 1335   K 4.4 12/29/2017 1014   CL 101 11/25/2018 1335   CO2 26 11/25/2018 1335   CO2 20 (L) 12/29/2017 1014   GLUCOSE 100 (H) 11/25/2018 1335   GLUCOSE 102 12/29/2017 1014   BUN 31 (H) 11/25/2018 1335   BUN 35 (H) 11/14/2018 1656   BUN 31.5 (H) 12/29/2017 1014   CREATININE 2.02 (H) 11/25/2018 1335   CREATININE 1.6 (H) 12/29/2017 1014   CALCIUM 9.8 11/25/2018 1335   CALCIUM 9.6 12/29/2017 1014   PROT 7.3 11/25/2018 1335   PROT 6.8 12/29/2017 1014   ALBUMIN 3.5 11/25/2018 1335   ALBUMIN 3.8 12/29/2017 1014   AST 17 11/25/2018 1335   AST 15 12/29/2017 1014   ALT 25 11/25/2018 1335   ALT 13 12/29/2017 1014   ALKPHOS 81 11/25/2018 1335   ALKPHOS 61 12/29/2017 1014   BILITOT 1.1 11/25/2018 1335   BILITOT  0.43 12/29/2017 1014   GFRNONAA 30 (L) 11/25/2018 1335   GFRAA 35 (L) 11/25/2018 1335       Component Value Date/Time   WBC 7.7 11/25/2018 1335   WBC 6.8 11/10/2018 0437   RBC 2.33 (L) 11/25/2018 1335   HGB 7.6 (L) 11/25/2018 1335   HGB 8.2 (L) 11/14/2018 1656   HGB 8.8 (L) 12/29/2017 1014   HCT 23.5 (L) 11/25/2018 1335   HCT 24.4 (L) 11/14/2018 1656   HCT 26.0 (L) 12/29/2017 1014   PLT 54 (L) 11/25/2018 1335   PLT 64 (LL) 11/14/2018 1656   MCV 100.9 (H) 11/25/2018 1335   MCV 96 11/14/2018 1656   MCV 89.3 12/29/2017 1014   MCH 32.6 11/25/2018 1335   MCHC 32.3 11/25/2018 1335   RDW 19.7 (H) 11/25/2018 1335   RDW 18.8 (H) 11/14/2018 1656   RDW 15.5 (H) 12/29/2017 1014   LYMPHSABS 1.6 11/25/2018 1335   LYMPHSABS 1.4 12/29/2017 1014   MONOABS 0.6 11/25/2018 1335   MONOABS 0.6 12/29/2017 1014   EOSABS 0.7 (H) 11/25/2018 1335   EOSABS 0.2 12/29/2017 1014   BASOSABS 0.0 11/25/2018 1335   BASOSABS 0.0 12/29/2017 1014   -------------------------------  Imaging from last 24 hours (if applicable):  Radiology interpretation: Dg Chest 2 View  Result Date: 11/25/2018 CLINICAL DATA:  History of bladder cancer.  UTI. EXAM: CHEST - 2 VIEW COMPARISON:  Chest x-ray 11/06/2018. FINDINGS: PowerPort catheter with lead tip over the right atrium. Prior CABG. Cardiomegaly with pulmonary venous congestion and bilateral interstitial prominence consistent with CHF. IMPRESSION: PowerPort catheter with lead tip over the right atrium. Cardiomegaly. Pulmonary venous congestion bilateral from interstitial prominence suggesting CHF. Electronically Signed   By: Marcello Moores  Register   On: 11/25/2018 13:24   Ir Catheter Tube Change  Result Date: 11/08/2018 INDICATION: 83 year old male admitted with urinary tract infection. He has a history of bilateral ureteral obstruction secondary to bladder carcinoma. He has existing nephroureteral tubes. Percutaneous suprapubic catheter was placed 08/19/2018 for outlet  obstruction. EXAM: IMAGE GUIDED EXCHANGE OF BILATERAL PERCUTANEOUS NEPHROURETERAL CATHETER IMAGE GUIDED EXCHANGE OF SUPRAPUBIC DRAINAGE CATHETER COMPARISON:  09/20/2018, 08/19/2018, 08/16/2018, 08/09/2018 MEDICATIONS: None ANESTHESIA/SEDATION: Fentanyl 300 mcg IV; Versed 6 mg IV Moderate Sedation Time:  45 minutes The patient was continuously monitored during the procedure by the interventional radiology nurse under my direct supervision. CONTRAST:  40mL ISOVUE-300 IOPAMIDOL (ISOVUE-300) INJECTION 61%, 17mL ISOVUE-300 IOPAMIDOL (ISOVUE-300) INJECTION 61% - administered into the collecting system(s) FLUOROSCOPY TIME:  Fluoroscopy Time: 4 minutes 0 seconds (67 mGy). COMPLICATIONS: None PROCEDURE: Informed written consent was obtained from the patient after a thorough discussion of the procedural risks, benefits and alternatives. All questions were addressed. Maximal Sterile Barrier Technique was utilized including caps, mask, sterile gowns, sterile gloves, sterile drape, hand hygiene and skin antiseptic. A timeout was performed prior to the initiation of the procedure. Patient position prone position. The bilateral indwelling percutaneous nephroureteral tubes were prepped and draped in the usual sterile fashion. We addressed the left first. 1% lidocaine was used for local anesthesia. Contrast was infused confirming location. The catheter was ligated and the catheter was removed on a Bentson wire. With the Bentson wire in the ureter, a new 24 cm ten French percutaneous nephroureteral catheter was advanced. Loop was formed in the urinary bladder and in the pelvis of the kidney. Contrast injection confirmed location. Suture was placed as a retention suture. We then addressed the right. 1% lidocaine was used for local anesthesia. Contrast was infused confirming location. Catheter was ligated and the catheter was attempted to be removed on a Bentson wire. The Bentson wire would not pass through the distal end of the  catheter. Bentson wire was removed and a exchange length Glidewire was used. Glidewire passed into the ureter and the catheter was removed from the [place]. A new 22 cm 10 French percutaneous nephroureteral catheter was advanced. Loop was formed in the urinary bladder and in the pelvis of the kidney. Contrast confirmed location. A suture was placed for retention suture. The patient was then moved into a supine position. The suprapubic catheter and the skin and subcutaneous tissues were prepped and draped in the usual sterile fashion. 1% lidocaine was used for local anesthesia. Bentson wire is passed in the urinary bladder under imaging. Catheter was removed. 55 [place] dilator was placed. A new 16 French pigtail catheters placed into the urinary bladder with loop formed in the urinary bladder. Contrast confirmed location. Retention suture was placed. Patient tolerated the procedure well and remained hemodynamically stable throughout. No complications were encountered and no significant blood loss. IMPRESSION: Status post routine exchange of bilateral percutaneous nephroureteral tubes with left 24 cm drainage catheter and right 22 cm drainage catheter. Suprapubic catheter exchange was also performed with up sizing to  a 16 French pigtail catheter. Signed, Dulcy Fanny. Dellia Nims, RPVI [facility] Radiology Electronically Signed   By: Corrie Mckusick D.O.   On: 11/08/2018 17:18   Dg Chest Portable 1 View  Result Date: 11/06/2018 CLINICAL DATA:  Altered mental status.  Fever. EXAM: PORTABLE CHEST 1 VIEW COMPARISON:  October 10, 2018 FINDINGS: The right Port-A-Cath is stable. Cardiomegaly is similar in the me interval. The hila and mediastinum are unchanged. No pneumothorax. No other acute abnormalities. IMPRESSION: Stable Port-A-Cath.  No cause for fever noted. Electronically Signed   By: Dorise Bullion III M.D   On: 11/06/2018 23:12   Ct Renal Stone  Study  Result Date: 11/06/2018 CLINICAL DATA:  83 year old male with history of abdominal pain, fever and bladder spasms. Recurrent urinary tract infections. History of bladder cancer. EXAM: CT ABDOMEN AND PELVIS WITHOUT CONTRAST TECHNIQUE: Multidetector CT imaging of the abdomen and pelvis was performed following the standard protocol without IV contrast. COMPARISON:  CT the abdomen and pelvis 10/24/2018. FINDINGS: Lower chest: Atherosclerotic calcifications in the right coronary artery. Small right and trace left pleural effusions lying dependently. Hepatobiliary: No definite suspicious cystic or solid hepatic lesions are confidently identified on today's noncontrast CT examination. Unenhanced appearance of the gallbladder is normal. Pancreas: No definite pancreatic mass or peripancreatic fluid or inflammatory changes noted on today's noncontrast CT examination. Spleen: Unremarkable. Adrenals/Urinary Tract: Bilateral nephrostomy tubes in position, with extension catheters extending into the urinary bladder. Suprapubic catheter extending into the urinary bladder. Left extrarenal pelvis. Bilateral adrenal glands are normal in appearance. Other than the indwelling catheters, urinary bladder is otherwise normal in appearance. Stomach/Bowel: Unenhanced appearance of the stomach is normal. No pathologic dilatation of small bowel or colon. Numerous colonic diverticulae are noted, without surrounding inflammatory changes to suggest an acute diverticulitis at this time. Left upper quadrant colostomy. Normal appendix. Vascular/Lymphatic: Aortic atherosclerosis. No lymphadenopathy noted in the abdomen or pelvis. Reproductive: Prostate gland and seminal vesicles are unremarkable in appearance. Other: Trace volume of ascites.  No pneumoperitoneum. Musculoskeletal: Status post PLIF at L3-L4. There are no aggressive appearing lytic or blastic lesions noted in the visualized portions of the skeleton. IMPRESSION: 1. No acute  findings are noted in the abdomen or pelvis to account for the patient's symptoms. 2. Colonic diverticulosis without evidence of acute diverticulitis at this time. 3. Small right and trace left pleural effusions lying dependently. 4. Aortic atherosclerosis, in addition to at least right coronary artery disease. 5. Additional incidental findings, similar prior study, as above. Electronically Signed   By: Vinnie Langton M.D.   On: 11/06/2018 18:39   Ir Ext Nephroureteral Cath Exchange  Result Date: 11/08/2018 INDICATION: 83 year old male admitted with urinary tract infection. He has a history of bilateral ureteral obstruction secondary to bladder carcinoma. He has existing nephroureteral tubes. Percutaneous suprapubic catheter was placed 08/19/2018 for outlet obstruction. EXAM: IMAGE GUIDED EXCHANGE OF BILATERAL PERCUTANEOUS NEPHROURETERAL CATHETER IMAGE GUIDED EXCHANGE OF SUPRAPUBIC DRAINAGE CATHETER COMPARISON:  09/20/2018, 08/19/2018, 08/16/2018, 08/09/2018 MEDICATIONS: None ANESTHESIA/SEDATION: Fentanyl 300 mcg IV; Versed 6 mg IV Moderate Sedation Time:  45 minutes The patient was continuously monitored during the procedure by the interventional radiology nurse under my direct supervision. CONTRAST:  62mL ISOVUE-300 IOPAMIDOL (ISOVUE-300) INJECTION 61%, 63mL ISOVUE-300 IOPAMIDOL (ISOVUE-300) INJECTION 61% - administered into the collecting system(s) FLUOROSCOPY TIME:  Fluoroscopy Time: 4 minutes 0 seconds (67 mGy). COMPLICATIONS: None PROCEDURE: Informed written consent was obtained from the patient after a thorough discussion of the procedural risks, benefits  and alternatives. All questions were addressed. Maximal Sterile Barrier Technique was utilized including caps, mask, sterile gowns, sterile gloves, sterile drape, hand hygiene and skin antiseptic. A timeout was performed prior to the initiation of the procedure. Patient position prone position. The bilateral indwelling percutaneous nephroureteral  tubes were prepped and draped in the usual sterile fashion. We addressed the left first. 1% lidocaine was used for local anesthesia. Contrast was infused confirming location. The catheter was ligated and the catheter was removed on a Bentson wire. With the Bentson wire in the ureter, a new 24 cm ten French percutaneous nephroureteral catheter was advanced. Loop was formed in the urinary bladder and in the pelvis of the kidney. Contrast injection confirmed location. Suture was placed as a retention suture. We then addressed the right. 1% lidocaine was used for local anesthesia. Contrast was infused confirming location. Catheter was ligated and the catheter was attempted to be removed on a Bentson wire. The Bentson wire would not pass through the distal end of the catheter. Bentson wire was removed and a exchange length Glidewire was used. Glidewire passed into the ureter and the catheter was removed from the [place]. A new 22 cm 10 French percutaneous nephroureteral catheter was advanced. Loop was formed in the urinary bladder and in the pelvis of the kidney. Contrast confirmed location. A suture was placed for retention suture. The patient was then moved into a supine position. The suprapubic catheter and the skin and subcutaneous tissues were prepped and draped in the usual sterile fashion. 1% lidocaine was used for local anesthesia. Bentson wire is passed in the urinary bladder under imaging. Catheter was removed. 18 [place] dilator was placed. A new 16 French pigtail catheters placed into the urinary bladder with loop formed in the urinary bladder. Contrast confirmed location. Retention suture was placed. Patient tolerated the procedure well and remained hemodynamically stable throughout. No complications were encountered and no significant blood loss. IMPRESSION: Status post routine exchange of bilateral percutaneous nephroureteral tubes with left 24 cm drainage catheter and right 22 cm drainage catheter.  Suprapubic catheter exchange was also performed with up sizing to a 16 French pigtail catheter. Signed, Dulcy Fanny. Dellia Nims, RPVI [facility] Radiology Electronically Signed   By: Corrie Mckusick D.O.   On: 11/08/2018 17:18   Ir Ext Nephroureteral Cath Exchange  Result Date: 11/08/2018 INDICATION: 83 year old male admitted with urinary tract infection. He has a history of bilateral ureteral obstruction secondary to bladder carcinoma. He has existing nephroureteral tubes. Percutaneous suprapubic catheter was placed 08/19/2018 for outlet obstruction. EXAM: IMAGE GUIDED EXCHANGE OF BILATERAL PERCUTANEOUS NEPHROURETERAL CATHETER IMAGE GUIDED EXCHANGE OF SUPRAPUBIC DRAINAGE CATHETER COMPARISON:  09/20/2018, 08/19/2018, 08/16/2018, 08/09/2018 MEDICATIONS: None ANESTHESIA/SEDATION: Fentanyl 300 mcg IV; Versed 6 mg IV Moderate Sedation Time:  45 minutes The patient was continuously monitored during the procedure by the interventional radiology nurse under my direct supervision. CONTRAST:  43mL ISOVUE-300 IOPAMIDOL (ISOVUE-300) INJECTION 61%, 40mL ISOVUE-300 IOPAMIDOL (ISOVUE-300) INJECTION 61% - administered into the collecting system(s) FLUOROSCOPY TIME:  Fluoroscopy Time: 4 minutes 0 seconds (67 mGy). COMPLICATIONS: None PROCEDURE: Informed written consent was obtained from the patient after a thorough discussion of the procedural risks, benefits and alternatives. All questions were addressed. Maximal Sterile Barrier Technique was utilized including caps, mask, sterile gowns, sterile gloves, sterile drape, hand hygiene and skin antiseptic. A timeout was performed prior to the initiation of the procedure. Patient position prone position. The bilateral indwelling percutaneous nephroureteral tubes were prepped and draped in the  usual sterile fashion. We addressed the left first. 1% lidocaine was used for local anesthesia. Contrast was infused confirming location. The  catheter was ligated and the catheter was removed on a Bentson wire. With the Bentson wire in the ureter, a new 24 cm ten French percutaneous nephroureteral catheter was advanced. Loop was formed in the urinary bladder and in the pelvis of the kidney. Contrast injection confirmed location. Suture was placed as a retention suture. We then addressed the right. 1% lidocaine was used for local anesthesia. Contrast was infused confirming location. Catheter was ligated and the catheter was attempted to be removed on a Bentson wire. The Bentson wire would not pass through the distal end of the catheter. Bentson wire was removed and a exchange length Glidewire was used. Glidewire passed into the ureter and the catheter was removed from the [place]. A new 22 cm 10 French percutaneous nephroureteral catheter was advanced. Loop was formed in the urinary bladder and in the pelvis of the kidney. Contrast confirmed location. A suture was placed for retention suture. The patient was then moved into a supine position. The suprapubic catheter and the skin and subcutaneous tissues were prepped and draped in the usual sterile fashion. 1% lidocaine was used for local anesthesia. Bentson wire is passed in the urinary bladder under imaging. Catheter was removed. 74 [place] dilator was placed. A new 16 French pigtail catheters placed into the urinary bladder with loop formed in the urinary bladder. Contrast confirmed location. Retention suture was placed. Patient tolerated the procedure well and remained hemodynamically stable throughout. No complications were encountered and no significant blood loss. IMPRESSION: Status post routine exchange of bilateral percutaneous nephroureteral tubes with left 24 cm drainage catheter and right 22 cm drainage catheter. Suprapubic catheter exchange was also performed with up sizing to a 16 French pigtail catheter. Signed, Dulcy Fanny. Dellia Nims, RPVI [facility]  [facility] Radiology Electronically Signed   By: Corrie Mckusick D.O.   On: 11/08/2018 17:18        This case was discussed Dr. [name]. He expressed agreement with my management of this patient.

## 2018-11-25 NOTE — Patient Instructions (Signed)
Implanted Port Home Guide An implanted port is a type of central line that is placed under the skin. Central lines are used to provide IV access when treatment or nutrition needs to be given through a person's veins. Implanted ports are used for long-term IV access. An implanted port may be placed because:  You need IV medicine that would be irritating to the small veins in your hands or arms.  You need long-term IV medicines, such as antibiotics.  You need IV nutrition for a long period.  You need frequent blood draws for lab tests.  You need dialysis.  Implanted ports are usually placed in the chest area, but they can also be placed in the upper arm, the abdomen, or the leg. An implanted port has two main parts:  Reservoir. The reservoir is round and will appear as a small, raised area under your skin. The reservoir is the part where a needle is inserted to give medicines or draw blood.  Catheter. The catheter is a thin, flexible tube that extends from the reservoir. The catheter is placed into a large vein. Medicine that is inserted into the reservoir goes into the catheter and then into the vein.  How will I care for my incision site? Do not get the incision site wet. Bathe or shower as directed by your health care provider. How is my port accessed? Special steps must be taken to access the port:  Before the port is accessed, a numbing cream can be placed on the skin. This helps numb the skin over the port site.  Your health care provider uses a sterile technique to access the port. ? Your health care provider must put on a mask and sterile gloves. ? The skin over your port is cleaned carefully with an antiseptic and allowed to dry. ? The port is gently pinched between sterile gloves, and a needle is inserted into the port.  Only "non-coring" port needles should be used to access the port. Once the port is accessed, a blood return should be checked. This helps ensure that the port  is in the vein and is not clogged.  If your port needs to remain accessed for a constant infusion, a clear (transparent) bandage will be placed over the needle site. The bandage and needle will need to be changed every week, or as directed by your health care provider.  Keep the bandage covering the needle clean and dry. Do not get it wet. Follow your health care provider's instructions on how to take a shower or bath while the port is accessed.  If your port does not need to stay accessed, no bandage is needed over the port.  What is flushing? Flushing helps keep the port from getting clogged. Follow your health care provider's instructions on how and when to flush the port. Ports are usually flushed with saline solution or a medicine called heparin. The need for flushing will depend on how the port is used.  If the port is used for intermittent medicines or blood draws, the port will need to be flushed: ? After medicines have been given. ? After blood has been drawn. ? As part of routine maintenance.  If a constant infusion is running, the port may not need to be flushed.  How long will my port stay implanted? The port can stay in for as long as your health care provider thinks it is needed. When it is time for the port to come out, surgery will be   done to remove it. The procedure is similar to the one performed when the port was put in. When should I seek immediate medical care? When you have an implanted port, you should seek immediate medical care if:  You notice a bad smell coming from the incision site.  You have swelling, redness, or drainage at the incision site.  You have more swelling or pain at the port site or the surrounding area.  You have a fever that is not controlled with medicine.  This information is not intended to replace advice given to you by your health care provider. Make sure you discuss any questions you have with your health care provider. Document  Released: 12/14/2005 Document Revised: 05/21/2016 Document Reviewed: 08/21/2013 Elsevier Interactive Patient Education  2017 [employer]  

## 2018-11-25 NOTE — H&P (Addendum)
History and Physical    [name] OIN:[id number] DOB: Mar 08, 1935 DOA: 11/25/2018  PCP: [name], MD Patient coming from: Home  Chief Complaint: Shortness of breath  HPI: Charles Coffey is a 83 y.o. male with medical history significant of chronic systolic CHF, CAD status post CABG and PCI, metastatic bladder cancer, hypertension, hyperlipidemia presenting to the hospital for evaluation of shortness of breath and fatigue.  Patient is a poor historian.  History provided by wife at bedside.  Patient has been having dyspnea on exertion, lower extremity edema, and increased somnolence.  No orthopnea.  He has been taking Lasix 40 mg twice daily for his bilateral lower extremity edema. Wife believes his left lower extremity looks bigger than the right lower extremity.  She has been encouraging him to drink a lot of fluid to stay hydrated and states patient has an eating a lot of salt this past week.  States patient has chronic anemia for which he has received blood transfusions in the past.  Patient has not noticed blood in his colostomy bag.  ED Course: Afebrile.  Satting well on room air.  Orthostatics checked in the ED negative. No leukocytosis.  Creatinine 2.0, recent baseline 1.8.  LFTs normal.  I-STAT troponin negative and EKG not suggestive of ACS.  BNP 2117. UA not suggestive of infection. Chest x-ray with evidence of pulmonary venous congestion suggesting CHF.  Left lower extremity Doppler negative for DVT.  Received IV Lasix 20 mg in the ED and started on normal saline infusion at 75 cc/h.  Review of Systems: As per HPI otherwise 10 point review of systems negative.  Past Medical History:  Diagnosis Date  . Aortic atherosclerosis ([place])   . BPH with urinary obstruction   . CAD (coronary artery disease)    a.  MI 1995, CABG x 3 2002 (patient says that he had LIMA and RIMA grafts). b. ETT-Cardiolite (10/15) with EF 48%, apical scar, no ischemia. c. Nuc 07/2017 abnormal -> cath  was performed,  patent LIMA to LAD and SVG to OM 2. RIMA to RCA is atretic. The right coronary artery is occluded distally with extensive collaterals from the LAD, medical therapy.   . Cancer [facility])    bladder cancer  . Cervical spondylosis without myelopathy Apr 07, 202017  . Chronic low back pain    sees Dr. Kary Kos   . GERD (gastroesophageal reflux disease)   . Hiatal hernia   . Hyperlipidemia   . Hypertension   . IBS (irritable bowel syndrome)   . Ischemic cardiomyopathy   . Myocardial infarction ([place])   . RBBB   . Restless legs syndrome   . Statin intolerance   . Syncope    a. in 2015 - no apparent cause, was taking sleep medicine at the time. Cardiac workup unremarkable.  . Tubular adenoma of colon     Past Surgical History:  Procedure Laterality Date  . COLONOSCOPY  10/17/2014   per Dr. Hilarie Fredrickson, tubular adenomas, repeat in 3 yrs   . COLONOSCOPY WITH PROPOFOL N/A 12/01/2017   Procedure: COLONOSCOPY WITH PROPOFOL;  Surgeon: Milus Banister, MD;  Location: [facility];  Service: Endoscopy;  Laterality: N/A;  . CYSTOSCOPY W/ RETROGRADES Left 11/25/2017   Procedure: CYSTOSCOPY WITH RETROGRADE PYELOGRAM;  Surgeon: Irine Seal, MD;  Location: [facility];  Service: Urology;  Laterality: Left;  . HEART BYPASS    . IR CATHETER TUBE CHANGE  11/08/2018  . IR CONVERT LEFT NEPHROSTOMY TO NEPHROURETERAL CATH  09/20/2018  .  IR CONVERT RIGHT NEPHROSTOMY TO NEPHROURETERAL CATH  02/04/2018  . IR CONVERT RIGHT NEPHROSTOMY TO NEPHROURETERAL CATH  08/23/2018  . IR EXT NEPHROURETERAL CATH EXCHANGE  04/11/2018  . IR EXT NEPHROURETERAL CATH EXCHANGE  07/04/2018  . IR EXT NEPHROURETERAL CATH EXCHANGE  09/20/2018  . IR EXT NEPHROURETERAL CATH EXCHANGE  11/08/2018  . IR EXT NEPHROURETERAL CATH EXCHANGE  11/08/2018  . IR FLUORO GUIDE CV LINE RIGHT  12/27/2017  . IR FLUORO GUIDED NEEDLE PLC ASPIRATION/INJECTION LOC  08/19/2018  . IR NEPHROSTOGRAM LEFT THRU EXISTING ACCESS  12/24/2017  . IR NEPHROSTOMY EXCHANGE  LEFT  01/28/2018  . IR NEPHROSTOMY EXCHANGE LEFT  02/04/2018  . IR NEPHROSTOMY EXCHANGE LEFT  02/25/2018  . IR NEPHROSTOMY EXCHANGE LEFT  07/04/2018  . IR NEPHROSTOMY EXCHANGE LEFT  07/05/2018  . IR NEPHROSTOMY EXCHANGE LEFT  08/09/2018  . IR NEPHROSTOMY EXCHANGE LEFT  08/16/2018  . IR NEPHROSTOMY EXCHANGE LEFT  08/23/2018  . IR NEPHROSTOMY EXCHANGE RIGHT  12/24/2017  . IR NEPHROSTOMY EXCHANGE RIGHT  01/28/2018  . IR NEPHROSTOMY EXCHANGE RIGHT  04/11/2018  . IR NEPHROSTOMY EXCHANGE RIGHT  07/05/2018  . IR NEPHROSTOMY EXCHANGE RIGHT  08/09/2018  . IR NEPHROSTOMY EXCHANGE RIGHT  08/19/2018  . IR NEPHROSTOMY PLACEMENT LEFT  11/28/2017  . IR NEPHROSTOMY PLACEMENT RIGHT  12/03/2017  . IR PATIENT EVAL TECH 0-60 MINS  03/24/2018  . IR PATIENT EVAL TECH 0-60 MINS  03/28/2018  . IR PATIENT EVAL TECH 0-60 MINS  04/25/2018  . IR PATIENT EVAL TECH 0-60 MINS  06/20/2018  . IR US GUIDE VASC ACCESS RIGHT  12/27/2017  . LAPAROSCOPY N/A 12/01/2017   Procedure: LAPAROSCOPIC DIVERTING OSTOMY;  Surgeon: Stark Klein, MD;  Location: [facility];  Service: General;  Laterality: N/A;  . LEFT HEART CATH AND CORS/GRAFTS ANGIOGRAPHY N/A 08/25/2017   Procedure: LEFT HEART CATH AND CORS/GRAFTS ANGIOGRAPHY;  Surgeon: Wellington Hampshire, MD;  Location: [facility];  Service: Cardiovascular;  Laterality: N/A;  . LUMBAR FUSION  2003   L3-L4  . PROSTATE SURGERY  06-27-12   per Dr. Roni Bread, had CTT  . TONSILLECTOMY    . TRANSURETHRAL RESECTION OF BLADDER TUMOR N/A 11/25/2017   Procedure: TRANSURETHRAL RESECTION OF BLADDER TUMOR (TURBT);  Surgeon: Irine Seal, MD;  Location: [facility];  Service: Urology;  Laterality: N/A;     reports that he quit smoking about 39 years ago. His smoking use included cigarettes. He has never used smokeless tobacco. He reports that he drank about 2.0 standard drinks of alcohol per week. He reports that he does not use drugs.  Allergies  Allergen Reactions  . Statins Other (See Comments)    liver effects  .  Cyproheptadine Other (See Comments)    Unable to urinate    Family History  Problem Relation Age of Onset  . Heart attack Mother   . Aneurysm Father        femoral artery  . Heart disease Brother   . Heart disease Maternal Uncle        x 2  . Colon cancer Neg Hx   . Esophageal cancer Neg Hx   . Pancreatic cancer Neg Hx   . Kidney disease Neg Hx   . Liver disease Neg Hx     Prior to Admission medications   Medication Sig Start Date End Date Taking? Authorizing Provider  carvedilol (COREG) 3.125 MG tablet Take 3.125 mg by mouth 2 (two) times daily with a meal.   Yes [provider]  DULoxetine (CYMBALTA) 20 MG capsule Take 1 capsule (20 mg total) by mouth 2 (two) times daily. Take once daily for 2 weeks then increase to twice daily 11/10/18  Yes Eugenie Filler, MD  famotidine (PEPCID) 20 MG tablet Take 1 tablet (20 mg total) by mouth 2 (two) times daily. 10/18/18  Yes Wyatt Portela, MD  feeding supplement (BOOST HIGH PROTEIN) LIQD Take 1 Container by mouth 2 (two) times daily between meals.   Yes [provider]  furosemide (LASIX) 40 MG tablet Take 1 tablet (40 mg total) by mouth daily as needed for fluid or edema. 11/10/18  Yes Eugenie Filler, MD  hydrOXYzine (ATARAX/VISTARIL) 25 MG tablet Take 1 tablet (25 mg total) by mouth every 4 (four) hours as needed for itching. 11/17/18  Yes [name], MD  lidocaine-prilocaine (EMLA) cream Apply 1 application topically as needed. 10/18/18  Yes Wyatt Portela, MD  mirabegron ER (MYRBETRIQ) 25 MG TB24 tablet Take 25 mg by mouth daily.   Yes [provider]  oxyCODONE (OXYCONTIN) 10 mg 12 hr tablet Take 1 tablet (10 mg total) by mouth at bedtime. 11/10/18  Yes Eugenie Filler, MD  oxyCODONE-acetaminophen (PERCOCET) 10-325 MG tablet Take 1 tablet by mouth every 4 (four) hours as needed for pain. 11/15/18  Yes Wyatt Portela, MD  pramipexole (MIRAPEX) 1 MG tablet TAKE 1 AND 1/2 TABLETS BY MOUTH AT  BEDTIME Patient taking differently: Take 1.5 mg by mouth every evening.  06/29/18  Yes [name], MD  triamcinolone cream (KENALOG) 0.1 % Apply topically 3 (three) times daily. To affected areas 07/04/18  Yes Donne Hazel, MD  nitroGLYCERIN (NITROSTAT) 0.4 MG SL tablet PLACE 1 TABLET (0.4 MG TOTAL) UNDER THE TONGUE EVERY 5 (FIVE) MINUTES AS NEEDED. 09/27/18 12/26/18  Dunn, Lisbeth Renshaw N, PA-C  OLANZapine (ZYPREXA) 5 MG tablet Take 0.5 tablets (2.5 mg total) by mouth at bedtime. Patient not taking: Reported on 11/25/2018 10/10/18   Harle Stanford., PA-C  polyvinyl alcohol (LIQUIFILM TEARS) 1.4 % ophthalmic solution Place 1 drop into both eyes 2 (two) times daily as needed (dry eyes). Patient not taking: Reported on 11/25/2018 08/24/18   Nita Sells, MD    Physical Exam: Vitals:   11/25/18 1915 11/25/18 1930 11/25/18 2044 11/25/18 2112  BP: 107/69 104/81 114/66   Pulse: 73 75 85   Resp: 20 19 18    Temp:   97.7 F (36.5 C)   TempSrc:   Oral   SpO2: 99% 91% 95%   Weight:    80 kg  Height:    5\' 8"  (1.727 m)    Physical Exam  Constitutional: He is oriented to person, place, and time. No distress.  HENT:  Head: Normocephalic.  Mouth/Throat: Oropharynx is clear and moist.  Eyes: Right eye exhibits no discharge. Left eye exhibits no discharge.  Neck: Neck supple. JVD present.  Cardiovascular: Normal rate, regular rhythm and intact distal pulses.  Pulmonary/Chest: Effort normal. No respiratory distress. He has no wheezes. He has rales.  Bibasilar rales  Abdominal: Soft. Bowel sounds are normal. He exhibits no distension. There is no tenderness. There is no guarding.  Colostomy bag in place  Musculoskeletal: He exhibits no edema.  Bilateral lower extremities appear symmetric in size.  No erythema, increased warmth, or edema.  Neurological: He is alert and oriented to person, place, and time.  Skin: Skin is warm and dry. He is not diaphoretic.     Labs on Admission: I have  personally reviewed following labs and imaging studies  CBC: Recent Labs  Lab 11/25/18 1335 11/25/18 1634  WBC 7.7 7.4  NEUTROABS 4.6 4.1  HGB 7.6* 7.5*  HCT 23.5* 23.6*  MCV 100.9* 101.3*  PLT 54* 49*   Basic Metabolic Panel: Recent Labs  Lab 11/25/18 1335 11/25/18 1604  NA 136 135  K 4.3 4.0  CL 101 100  CO2 26 25  GLUCOSE 100* 99  BUN 31* 34*  CREATININE 2.02* 2.00*  CALCIUM 9.8 9.4   GFR: Estimated Creatinine Clearance: 28 mL/min (A) (by C-G formula based on SCr of 2 mg/dL (H)). Liver Function Tests: Recent Labs  Lab 11/25/18 1335  AST 17  ALT 25  ALKPHOS 81  BILITOT 1.1  PROT 7.3  ALBUMIN 3.5   No results for input(s): LIPASE, AMYLASE in the last 168 hours. No results for input(s): AMMONIA in the last 168 hours. Coagulation Profile: Recent Labs  Lab 11/25/18 1604  INR 1.18   Cardiac Enzymes: Recent Labs  Lab 11/25/18 1604  TROPONINI <0.03   BNP (last 3 results) No results for input(s): PROBNP in the last 8760 hours. HbA1C: No results for input(s): HGBA1C in the last 72 hours. CBG: No results for input(s): GLUCAP in the last 168 hours. Lipid Profile: No results for input(s): CHOL, HDL, LDLCALC, TRIG, CHOLHDL, LDLDIRECT in the last 72 hours. Thyroid Function Tests: No results for input(s): TSH, T4TOTAL, FREET4, T3FREE, THYROIDAB in the last 72 hours. Anemia Panel: No results for input(s): VITAMINB12, FOLATE, FERRITIN, TIBC, IRON, RETICCTPCT in the last 72 hours. Urine analysis:    Component Value Date/Time   COLORURINE STRAW (A) 11/25/2018 [place] 11/25/2018 1617   LABSPEC 1.006 11/25/2018 1617   PHURINE 7.0 11/25/2018 1617   GLUCOSEU NEGATIVE 11/25/2018 1617   HGBUR SMALL (A) 11/25/2018 1617   HGBUR negative 10/15/2010 1251   BILIRUBINUR NEGATIVE 11/25/2018 1617   BILIRUBINUR n 12/01/2016 1521   KETONESUR NEGATIVE 11/25/2018 1617   PROTEINUR NEGATIVE 11/25/2018 1617   UROBILINOGEN 0.2 12/01/2016 1521    UROBILINOGEN 0.2 10/15/2010 1251   NITRITE NEGATIVE 11/25/2018 1617   LEUKOCYTESUR SMALL (A) 11/25/2018 1617    Radiological Exams on Admission: Dg Chest 2 View  Result Date: 11/25/2018 CLINICAL DATA:  History of bladder cancer.  UTI. EXAM: CHEST - 2 VIEW COMPARISON:  Chest x-ray 11/06/2018. FINDINGS: PowerPort catheter with lead tip over the right atrium. Prior CABG. Cardiomegaly with pulmonary venous congestion and bilateral interstitial prominence consistent with CHF. IMPRESSION: PowerPort catheter with lead tip over the right atrium. Cardiomegaly. Pulmonary venous congestion bilateral from interstitial prominence suggesting CHF. Electronically Signed   By: Marcello [place]  Register   On: 11/25/2018 13:24    EKG: Independently reviewed.  Sinus rhythm with first-degree AV block, right bundle branch block.  No significant change since prior tracing.  Assessment/Plan Active Problems:   HTN (hypertension)   CAD (coronary artery disease)   RLS (restless legs syndrome)   Chronic anemia   CKD (chronic kidney disease) stage 3, GFR 30-59 ml/min (HCC)   Hydronephrosis due to obstructive malignant neoplasm of bladder (HCC)   Urothelial carcinoma of bladder (HCC)   Thrombocytopathia (HCC)   Acute exacerbation of CHF (congestive heart failure) (HCC)   Fatigue   Chronic pain   Depression   GERD (gastroesophageal reflux disease)   Status post colostomy (HCC)   Acute exacerbation of chronic systolic congestive heart failure -Likely due to increased dietary fluid and sodium intake. -Satting well on room air.  Noted to have bibasilar rales and JVD on exam. BNP 2117.  Chest x-ray with evidence of pulmonary venous congestion suggesting CHF.  Wife was concerned that the left lower extremity appears larger than the right lower extremity but left lower extremity Doppler done in the ED was negative for DVT. -Echo done in August 2019 showing EF 25 to 30%, diffuse hypokinesis, akinesis of the apical myocardium,  moderate mitral regurgitation, severely dilated left atrium, right ventricular overload with decreased systolic function, and severely dilated right atrium. -Patient received IV Lasix 20 mg once in the ED.  Will continue IV Lasix 40 mg twice daily starting tonight. -Continue monitor renal function -Monitor intake and output -Daily weights  Fatigue Likely multifactorial secondary to CHF, cancer, and chronic anemia.  Hemoglobin currently at baseline.  LFTs normal.  TSH checked in August 2019 was normal.  Infection less likely to be a contributing factor as patient is afebrile and does not have leukocytosis.  UA not suggestive of infection.  Chest x-ray without evidence of pneumonia. -PT evaluation -Nutrition consult; continue home feeding supplement  CKD 3 Creatinine mildly elevated at 2.0, recent baseline 1.8. -Patient was started on normal saline infusion in the ED. Will discontinue in the setting of acute CHF exacerbation. -Continue to monitor renal function with IV diuresis -Avoid nephrotoxic agents/contrast  CAD status post CABG and PCI I-STAT troponin negative and EKG not suggestive of ACS.  Patient is not complaining of chest pain. -Continue home Coreg  High-grade urothelial carcinoma involving the genitourinary tract diagnosed in 2018 Followed by Dr. [name].  Currently on conservative management and enrolled in home health palliative care.  Bilateral hydronephrosis 2/2 obstructive bladder neoplasm He has a nephrostomy tube internalized with suprapubic catheter in place.  Followed by Dr. [name].  -Continue home mirabegron  History of colon obstruction status post colostomy -Colostomy care  Chronic thrombocytopenia Platelet count 49,000.  It is chronically in the 40-60,000 range.  Likely related to malignancy and previous treatment.  No signs of active bleeding.  Chronic anemia Hemoglobin 7.5, baseline 7-8.  Likely related to malignancy and previous treatment. -Anemia  panel -Will hold off giving a blood transfusion at this time in the setting of volume overload from acute CHF exacerbation  Chronic pain -Continue home Percocet  Hypertension Currently normotensive. -Continue home Coreg  Restless leg syndrome -Continue home pramipexole  Depression -Continue home duloxetine  GERD -Continue home Pepcid  DVT prophylaxis: SCDs (thrombocytopenia) Code Status: DNR.  Confirmed with patient and wife at bedside. Family Communication: Wife at bedside Disposition Plan: Anticipate discharge to home after clinical improvement. Consults called: None Admission status: Observation   Shela Leff MD [facility] Pager [phone]  If 7PM-7AM, please contact night-coverage [email or web address] Password [facility]  11/25/2018, 9:25 PM

## 2018-11-25 NOTE — Progress Notes (Signed)
Left lower extremity venous duplex completed. Preliminary results - There is no evidence of a DVT or Baker's cyst. Charles Coffey 11/25/2018, 6:29 PM

## 2018-11-25 NOTE — ED Notes (Signed)
Bed: [facility] Expected date:  Expected time:  Means of arrival:  Comments: [place] CHF to room 16

## 2018-11-26 ENCOUNTER — Encounter: Payer: Self-pay | Admitting: Internal Medicine

## 2018-11-26 DIAGNOSIS — I5023 Acute on chronic systolic (congestive) heart failure: Secondary | ICD-10-CM | POA: Diagnosis not present

## 2018-11-26 LAB — CBC
HCT: 25.9 % — ABNORMAL LOW (ref 39.0–52.0)
Hemoglobin: 7.9 g/dL — ABNORMAL LOW (ref 13.0–17.0)
MCH: 31.7 pg (ref 26.0–34.0)
MCHC: 30.5 g/dL (ref 30.0–36.0)
MCV: 104 fL — ABNORMAL HIGH (ref 80.0–100.0)
NRBC: 0 % (ref 0.0–0.2)
Platelets: 58 10*3/uL — ABNORMAL LOW (ref 150–400)
RBC: 2.49 MIL/uL — ABNORMAL LOW (ref 4.22–5.81)
RDW: 19.4 % — ABNORMAL HIGH (ref 11.5–15.5)
WBC: 6.9 10*3/uL (ref 4.0–10.5)

## 2018-11-26 LAB — BASIC METABOLIC PANEL
Anion gap: 11 (ref 5–15)
BUN: 35 mg/dL — ABNORMAL HIGH (ref 8–23)
CHLORIDE: 98 mmol/L (ref 98–111)
CO2: 28 mmol/L (ref 22–32)
Calcium: 9.6 mg/dL (ref 8.9–10.3)
Creatinine, Ser: 2.03 mg/dL — ABNORMAL HIGH (ref 0.61–1.24)
GFR calc Af Amer: 35 mL/min — ABNORMAL LOW (ref >60)
GFR calc non Af Amer: 30 mL/min — ABNORMAL LOW (ref >60)
Glucose, Bld: 95 mg/dL (ref 70–99)
Potassium: 3.6 mmol/L (ref 3.5–5.1)
Sodium: 137 mmol/L (ref 135–145)

## 2018-11-26 LAB — FERRITIN: Ferritin: 613 ng/mL — ABNORMAL HIGH (ref 24–336)

## 2018-11-26 LAB — IRON AND TIBC
Iron: 111 ug/dL (ref 45–182)
Saturation Ratios: 37 % (ref 17.9–39.5)
TIBC: 300 ug/dL (ref 250–450)
UIBC: 189 ug/dL

## 2018-11-26 LAB — URINE CULTURE: Culture: NO GROWTH

## 2018-11-26 LAB — FOLATE: Folate: 14.9 ng/mL (ref >5.9)

## 2018-11-26 LAB — RETICULOCYTES
Immature Retic Fract: 17 % — ABNORMAL HIGH (ref 2.3–15.9)
RBC.: 2.54 MIL/uL — ABNORMAL LOW (ref 4.22–5.81)
Retic Count, Absolute: 65.3 10*3/uL (ref 19.0–186.0)
Retic Ct Pct: 2.6 % (ref 0.4–3.1)

## 2018-11-26 LAB — MAGNESIUM: Magnesium: 2.2 mg/dL (ref 1.7–2.4)

## 2018-11-26 LAB — VITAMIN B12: Vitamin B-12: 856 pg/mL (ref 180–914)

## 2018-11-26 MED ORDER — HEPARIN SOD (PORK) LOCK FLUSH 100 UNIT/ML IV SOLN
500.0000 [IU] | INTRAVENOUS | Status: AC | PRN
  Administered 2018-11-26: 500 [IU]

## 2018-11-26 MED ORDER — ENSURE ENLIVE PO LIQD: 237.0000 mL | Freq: Two times a day (BID) | ORAL | Status: DC

## 2018-11-26 MED ORDER — SODIUM CHLORIDE 0.9% FLUSH
10.0000 mL | INTRAVENOUS | Status: DC | PRN
  Administered 2018-11-26 (×2): 10 mL
  Filled 2018-11-26 (×2): qty 40

## 2018-11-26 NOTE — Discharge Summary (Signed)
Physician Discharge Summary  Charles Coffey IRJ:[id number] DOB: 1935/04/20 DOA: 11/25/2018  PCP: [name], MD  Admit date: 11/25/2018 Discharge date: 11/26/2018  Admitted From: Home Disposition: Home  Recommendations for Outpatient Follow-up:  1. Follow up with PCP in 1 week 2. Follow up with Cardiologist in 1 week 3. Please obtain BMP/CBC in one week 4. Please follow up on the following pending results: None  Home Health: None Equipment/Devices: None  Discharge Condition: Stable CODE STATUS: DNR Diet recommendation: Heart healthy   Brief/Interim Summary:  Admission HPI written by Shela Leff, MD   Chief Complaint: Shortness of breath  HPI: Charles Coffey is a 83 y.o. male with medical history significant of chronic systolic CHF, CAD status post CABG and PCI, metastatic bladder cancer, hypertension, hyperlipidemia presenting to the hospital for evaluation of shortness of breath and fatigue.  Patient is a poor historian.  History provided by wife at bedside.  Patient has been having dyspnea on exertion, lower extremity edema, and increased somnolence.  No orthopnea.  He has been taking Lasix 40 mg twice daily for his bilateral lower extremity edema. Wife believes his left lower extremity looks bigger than the right lower extremity.  She has been encouraging him to drink a lot of fluid to stay hydrated and states patient has an eating a lot of salt this past week.  States patient has chronic anemia for which he has received blood transfusions in the past.  Patient has not noticed blood in his colostomy bag.  ED Course: Afebrile.  Satting well on room air.  Orthostatics checked in the ED negative. No leukocytosis.  Creatinine 2.0, recent baseline 1.8.  LFTs normal.  I-STAT troponin negative and EKG not suggestive of ACS.  BNP 2117. UA not suggestive of infection. Chest x-ray with evidence of pulmonary venous congestion suggesting CHF.  Left lower extremity  Doppler negative for DVT.  Received IV Lasix 20 mg in the ED and started on normal saline infusion at 75 cc/h.   Hospital course:  Acute systolic heart failure Thought secondary to increased intake of dietary fluid and sodium intake. Improved rapidly with IV lasix. Patient on room air. No dyspnea. Persistent mild rales. Weight down to 169 lbs which is below patient's usual weight in 172 lb range. Will discharge with home regimen of Lasix with instructions to increase to 80 mg BID for a day if noticing a 3 lb weight gain. Ambulated well without oxygen and without decreased tolerance.   Discharge Diagnoses:  Active Problems:   HTN (hypertension)   CAD (coronary artery disease)   RLS (restless legs syndrome)   Chronic anemia   CKD (chronic kidney disease) stage 3, GFR 30-59 ml/min (HCC)   Hydronephrosis due to obstructive malignant neoplasm of bladder (HCC)   Urothelial carcinoma of bladder (HCC)   Thrombocytopathia (HCC)   Acute exacerbation of CHF (congestive heart failure) (HCC)   Fatigue   Chronic pain   Depression   GERD (gastroesophageal reflux disease)   Status post colostomy [facility])    Discharge Instructions  Discharge Instructions    Call MD for:  difficulty breathing, headache or visual disturbances   Complete by:  As directed    Diet - low sodium heart healthy   Complete by:  As directed    Increase activity slowly   Complete by:  As directed      Allergies as of 11/26/2018      Reactions   Statins Other (See Comments)  liver effects   Cyproheptadine Other (See Comments)   Unable to urinate      Medication List    STOP taking these medications   OLANZapine 5 MG tablet Commonly known as:  ZYPREXA     TAKE these medications   carvedilol 3.125 MG tablet Commonly known as:  COREG Take 3.125 mg by mouth 2 (two) times daily with a meal.   DULoxetine 20 MG capsule Commonly known as:  CYMBALTA Take 1 capsule (20 mg total) by mouth 2 (two) times daily. Take  once daily for 2 weeks then increase to twice daily   famotidine 20 MG tablet Commonly known as:  PEPCID Take 1 tablet (20 mg total) by mouth 2 (two) times daily.   feeding supplement Liqd Take 1 Container by mouth 2 (two) times daily between meals.   furosemide 40 MG tablet Commonly known as:  LASIX Take 1 tablet (40 mg total) by mouth daily as needed for fluid or edema.   hydrOXYzine 25 MG tablet Commonly known as:  ATARAX/VISTARIL Take 1 tablet (25 mg total) by mouth every 4 (four) hours as needed for itching.   lidocaine-prilocaine cream Commonly known as:  EMLA Apply 1 application topically as needed.   MYRBETRIQ 25 MG Tb24 tablet Generic drug:  mirabegron ER Take 25 mg by mouth daily.   nitroGLYCERIN 0.4 MG SL tablet Commonly known as:  NITROSTAT PLACE 1 TABLET (0.4 MG TOTAL) UNDER THE TONGUE EVERY 5 (FIVE) MINUTES AS NEEDED.   oxyCODONE 10 mg 12 hr tablet Commonly known as:  OXYCONTIN Take 1 tablet (10 mg total) by mouth at bedtime.   oxyCODONE-acetaminophen 10-325 MG tablet Commonly known as:  PERCOCET Take 1 tablet by mouth every 4 (four) hours as needed for pain.   polyvinyl alcohol 1.4 % ophthalmic solution Commonly known as:  LIQUIFILM TEARS Place 1 drop into both eyes 2 (two) times daily as needed (dry eyes).   pramipexole 1 MG tablet Commonly known as:  MIRAPEX TAKE 1 AND 1/2 TABLETS BY MOUTH AT BEDTIME What changed:  See the new instructions.   triamcinolone cream 0.1 % Commonly known as:  KENALOG Apply topically 3 (three) times daily. To affected areas      Follow-up Information    [name], MD. Schedule an appointment as soon as possible for a visit in 1 week(s).   Specialty:  Family Medicine Contact information: [place] [address] [phone]        [name], MD .   Specialty:  Cardiology Contact information: [address] [place] [address] [phone]          Allergies   Allergen Reactions  . Statins Other (See Comments)    liver effects  . Cyproheptadine Other (See Comments)    Unable to urinate    Consultations:  None   Procedures/Studies: Dg Chest 2 View  Result Date: 11/25/2018 CLINICAL DATA:  History of bladder cancer.  UTI. EXAM: CHEST - 2 VIEW COMPARISON:  Chest x-ray 11/06/2018. FINDINGS: PowerPort catheter with lead tip over the right atrium. Prior CABG. Cardiomegaly with pulmonary venous congestion and bilateral interstitial prominence consistent with CHF. IMPRESSION: PowerPort catheter with lead tip over the right atrium. Cardiomegaly. Pulmonary venous congestion bilateral from interstitial prominence suggesting CHF. Electronically Signed   By: Marcello Moores  Register   On: 11/25/2018 13:24   Ir Catheter Tube Change  Result Date: 11/08/2018 INDICATION: 83 year old male admitted with urinary tract infection. He has a history  of bilateral ureteral obstruction secondary to bladder carcinoma. He has existing nephroureteral tubes. Percutaneous suprapubic catheter was placed 08/19/2018 for outlet obstruction. EXAM: IMAGE GUIDED EXCHANGE OF BILATERAL PERCUTANEOUS NEPHROURETERAL CATHETER IMAGE GUIDED EXCHANGE OF SUPRAPUBIC DRAINAGE CATHETER COMPARISON:  09/20/2018, 08/19/2018, 08/16/2018, 08/09/2018 MEDICATIONS: None ANESTHESIA/SEDATION: Fentanyl 300 mcg IV; Versed 6 mg IV Moderate Sedation Time:  45 minutes The patient was continuously monitored during the procedure by the interventional radiology nurse under my direct supervision. CONTRAST:  17mL ISOVUE-300 IOPAMIDOL (ISOVUE-300) INJECTION 61%, 57mL ISOVUE-300 IOPAMIDOL (ISOVUE-300) INJECTION 61% - administered into the collecting system(s) FLUOROSCOPY TIME:  Fluoroscopy Time: 4 minutes 0 seconds (67 mGy). COMPLICATIONS: None PROCEDURE: Informed written consent was obtained from the patient after a thorough discussion of the procedural risks, benefits and alternatives. All questions were addressed. Maximal  Sterile Barrier Technique was utilized including caps, mask, sterile gowns, sterile gloves, sterile drape, hand hygiene and skin antiseptic. A timeout was performed prior to the initiation of the procedure. Patient position prone position. The bilateral indwelling percutaneous nephroureteral tubes were prepped and draped in the usual sterile fashion. We addressed the left first. 1% lidocaine was used for local anesthesia. Contrast was infused confirming location. The catheter was ligated and the catheter was removed on a Bentson wire. With the Bentson wire in the ureter, a new 24 cm ten French percutaneous nephroureteral catheter was advanced. Loop was formed in the urinary bladder and in the pelvis of the kidney. Contrast injection confirmed location. Suture was placed as a retention suture. We then addressed the right. 1% lidocaine was used for local anesthesia. Contrast was infused confirming location. Catheter was ligated and the catheter was attempted to be removed on a Bentson wire. The Bentson wire would not pass through the distal end of the catheter. Bentson wire was removed and a exchange length Glidewire was used. Glidewire passed into the ureter and the catheter was removed from the [place]. A new 22 cm 10 French percutaneous nephroureteral catheter was advanced. Loop was formed in the urinary bladder and in the pelvis of the kidney. Contrast confirmed location. A suture was placed for retention suture. The patient was then moved into a supine position. The suprapubic catheter and the skin and subcutaneous tissues were prepped and draped in the usual sterile fashion. 1% lidocaine was used for local anesthesia. Bentson wire is passed in the urinary bladder under imaging. Catheter was removed. 47 [place] dilator was placed. A new 16 French pigtail catheters placed into the urinary bladder with loop formed in the urinary bladder. Contrast confirmed location. Retention suture was placed. Patient  tolerated the procedure well and remained hemodynamically stable throughout. No complications were encountered and no significant blood loss. IMPRESSION: Status post routine exchange of bilateral percutaneous nephroureteral tubes with left 24 cm drainage catheter and right 22 cm drainage catheter. Suprapubic catheter exchange was also performed with up sizing to a 16 French pigtail catheter. Signed, Dulcy Fanny. Dellia Nims, RPVI [facility] Radiology Electronically Signed   By: Corrie Mckusick D.O.   On: 11/08/2018 17:18   Dg Chest Portable 1 View  Result Date: 11/06/2018 CLINICAL DATA:  Altered mental status.  Fever. EXAM: PORTABLE CHEST 1 VIEW COMPARISON:  October 10, 2018 FINDINGS: The right Port-A-Cath is stable. Cardiomegaly is similar in the me interval. The hila and mediastinum are unchanged. No pneumothorax. No other acute abnormalities. IMPRESSION: Stable Port-A-Cath.  No cause for fever noted. Electronically Signed   By: Dorise Bullion III M.D   On:  11/06/2018 23:12   Ct Renal Stone Study  Result Date: 11/06/2018 CLINICAL DATA:  83 year old male with history of abdominal pain, fever and bladder spasms. Recurrent urinary tract infections. History of bladder cancer. EXAM: CT ABDOMEN AND PELVIS WITHOUT CONTRAST TECHNIQUE: Multidetector CT imaging of the abdomen and pelvis was performed following the standard protocol without IV contrast. COMPARISON:  CT the abdomen and pelvis 10/24/2018. FINDINGS: Lower chest: Atherosclerotic calcifications in the right coronary artery. Small right and trace left pleural effusions lying dependently. Hepatobiliary: No definite suspicious cystic or solid hepatic lesions are confidently identified on today's noncontrast CT examination. Unenhanced appearance of the gallbladder is normal. Pancreas: No definite pancreatic mass or peripancreatic fluid or inflammatory changes noted on today's noncontrast CT examination. Spleen:  Unremarkable. Adrenals/Urinary Tract: Bilateral nephrostomy tubes in position, with extension catheters extending into the urinary bladder. Suprapubic catheter extending into the urinary bladder. Left extrarenal pelvis. Bilateral adrenal glands are normal in appearance. Other than the indwelling catheters, urinary bladder is otherwise normal in appearance. Stomach/Bowel: Unenhanced appearance of the stomach is normal. No pathologic dilatation of small bowel or colon. Numerous colonic diverticulae are noted, without surrounding inflammatory changes to suggest an acute diverticulitis at this time. Left upper quadrant colostomy. Normal appendix. Vascular/Lymphatic: Aortic atherosclerosis. No lymphadenopathy noted in the abdomen or pelvis. Reproductive: Prostate gland and seminal vesicles are unremarkable in appearance. Other: Trace volume of ascites.  No pneumoperitoneum. Musculoskeletal: Status post PLIF at L3-L4. There are no aggressive appearing lytic or blastic lesions noted in the visualized portions of the skeleton. IMPRESSION: 1. No acute findings are noted in the abdomen or pelvis to account for the patient's symptoms. 2. Colonic diverticulosis without evidence of acute diverticulitis at this time. 3. Small right and trace left pleural effusions lying dependently. 4. Aortic atherosclerosis, in addition to at least right coronary artery disease. 5. Additional incidental findings, similar prior study, as above. Electronically Signed   By: Vinnie Langton M.D.   On: 11/06/2018 18:39   Ir Ext Nephroureteral Cath Exchange  Result Date: 11/08/2018 INDICATION: 83 year old male admitted with urinary tract infection. He has a history of bilateral ureteral obstruction secondary to bladder carcinoma. He has existing nephroureteral tubes. Percutaneous suprapubic catheter was placed 08/19/2018 for outlet obstruction. EXAM: IMAGE GUIDED EXCHANGE OF BILATERAL PERCUTANEOUS NEPHROURETERAL CATHETER IMAGE GUIDED EXCHANGE OF  SUPRAPUBIC DRAINAGE CATHETER COMPARISON:  09/20/2018, 08/19/2018, 08/16/2018, 08/09/2018 MEDICATIONS: None ANESTHESIA/SEDATION: Fentanyl 300 mcg IV; Versed 6 mg IV Moderate Sedation Time:  45 minutes The patient was continuously monitored during the procedure by the interventional radiology nurse under my direct supervision. CONTRAST:  75mL ISOVUE-300 IOPAMIDOL (ISOVUE-300) INJECTION 61%, 51mL ISOVUE-300 IOPAMIDOL (ISOVUE-300) INJECTION 61% - administered into the collecting system(s) FLUOROSCOPY TIME:  Fluoroscopy Time: 4 minutes 0 seconds (67 mGy). COMPLICATIONS: None PROCEDURE: Informed written consent was obtained from the patient after a thorough discussion of the procedural risks, benefits and alternatives. All questions were addressed. Maximal Sterile Barrier Technique was utilized including caps, mask, sterile gowns, sterile gloves, sterile drape, hand hygiene and skin antiseptic. A timeout was performed prior to the initiation of the procedure. Patient position prone position. The bilateral indwelling percutaneous nephroureteral tubes were prepped and draped in the usual sterile fashion. We addressed the left first. 1% lidocaine was used for local anesthesia. Contrast was infused confirming location. The catheter was ligated and the catheter was removed on a Bentson wire. With the Bentson wire in the ureter, a new 24 cm ten French percutaneous nephroureteral catheter was advanced. Loop was formed in  the urinary bladder and in the pelvis of the kidney. Contrast injection confirmed location. Suture was placed as a retention suture. We then addressed the right. 1% lidocaine was used for local anesthesia. Contrast was infused confirming location. Catheter was ligated and the catheter was attempted to be removed on a Bentson wire. The Bentson wire would not pass through the distal end of the catheter. Bentson wire was removed and a exchange length Glidewire was used. Glidewire passed into the ureter and the  catheter was removed from the [place]. A new 22 cm 10 French percutaneous nephroureteral catheter was advanced. Loop was formed in the urinary bladder and in the pelvis of the kidney. Contrast confirmed location. A suture was placed for retention suture. The patient was then moved into a supine position. The suprapubic catheter and the skin and subcutaneous tissues were prepped and draped in the usual sterile fashion. 1% lidocaine was used for local anesthesia. Bentson wire is passed in the urinary bladder under imaging. Catheter was removed. 13 [place] dilator was placed. A new 16 French pigtail catheters placed into the urinary bladder with loop formed in the urinary bladder. Contrast confirmed location. Retention suture was placed. Patient tolerated the procedure well and remained hemodynamically stable throughout. No complications were encountered and no significant blood loss. IMPRESSION: Status post routine exchange of bilateral percutaneous nephroureteral tubes with left 24 cm drainage catheter and right 22 cm drainage catheter. Suprapubic catheter exchange was also performed with up sizing to a 16 French pigtail catheter. Signed, Dulcy Fanny. Dellia Nims, RPVI [facility] Radiology Electronically Signed   By: Corrie Mckusick D.O.   On: 11/08/2018 17:18   Ir Ext Nephroureteral Cath Exchange  Result Date: 11/08/2018 INDICATION: 83 year old male admitted with urinary tract infection. He has a history of bilateral ureteral obstruction secondary to bladder carcinoma. He has existing nephroureteral tubes. Percutaneous suprapubic catheter was placed 08/19/2018 for outlet obstruction. EXAM: IMAGE GUIDED EXCHANGE OF BILATERAL PERCUTANEOUS NEPHROURETERAL CATHETER IMAGE GUIDED EXCHANGE OF SUPRAPUBIC DRAINAGE CATHETER COMPARISON:  09/20/2018, 08/19/2018, 08/16/2018, 08/09/2018 MEDICATIONS: None ANESTHESIA/SEDATION: Fentanyl 300 mcg IV; Versed 6 mg IV Moderate Sedation  Time:  45 minutes The patient was continuously monitored during the procedure by the interventional radiology nurse under my direct supervision. CONTRAST:  54mL ISOVUE-300 IOPAMIDOL (ISOVUE-300) INJECTION 61%, 46mL ISOVUE-300 IOPAMIDOL (ISOVUE-300) INJECTION 61% - administered into the collecting system(s) FLUOROSCOPY TIME:  Fluoroscopy Time: 4 minutes 0 seconds (67 mGy). COMPLICATIONS: None PROCEDURE: Informed written consent was obtained from the patient after a thorough discussion of the procedural risks, benefits and alternatives. All questions were addressed. Maximal Sterile Barrier Technique was utilized including caps, mask, sterile gowns, sterile gloves, sterile drape, hand hygiene and skin antiseptic. A timeout was performed prior to the initiation of the procedure. Patient position prone position. The bilateral indwelling percutaneous nephroureteral tubes were prepped and draped in the usual sterile fashion. We addressed the left first. 1% lidocaine was used for local anesthesia. Contrast was infused confirming location. The catheter was ligated and the catheter was removed on a Bentson wire. With the Bentson wire in the ureter, a new 24 cm ten French percutaneous nephroureteral catheter was advanced. Loop was formed in the urinary bladder and in the pelvis of the kidney. Contrast injection confirmed location. Suture was placed as a retention suture. We then addressed the right. 1% lidocaine was used for local anesthesia. Contrast was infused confirming location. Catheter was ligated and the catheter was attempted to be removed on a Bentson wire.  The Bentson wire would not pass through the distal end of the catheter. Bentson wire was removed and a exchange length Glidewire was used. Glidewire passed into the ureter and the catheter was removed from the [place]. A new 22 cm 10 French percutaneous nephroureteral catheter was advanced. Loop was formed in the urinary bladder and in the pelvis of the kidney.  Contrast confirmed location. A suture was placed for retention suture. The patient was then moved into a supine position. The suprapubic catheter and the skin and subcutaneous tissues were prepped and draped in the usual sterile fashion. 1% lidocaine was used for local anesthesia. Bentson wire is passed in the urinary bladder under imaging. Catheter was removed. 19 [place] dilator was placed. A new 16 French pigtail catheters placed into the urinary bladder with loop formed in the urinary bladder. Contrast confirmed location. Retention suture was placed. Patient tolerated the procedure well and remained hemodynamically stable throughout. No complications were encountered and no significant blood loss. IMPRESSION: Status post routine exchange of bilateral percutaneous nephroureteral tubes with left 24 cm drainage catheter and right 22 cm drainage catheter. Suprapubic catheter exchange was also performed with up sizing to a 16 French pigtail catheter. Signed, Dulcy Fanny. Dellia Nims, RPVI [facility] Radiology Electronically Signed   By: Corrie Mckusick D.O.   On: 11/08/2018 17:18      Subjective: No chest pain or dyspnea  Discharge Exam: Vitals:   11/25/18 2044 11/26/18 0501  BP: 114/66 101/90  Pulse: 85 74  Resp: 18 18  Temp: 97.7 F (36.5 C) 98.8 F (37.1 C)  SpO2: 95% 95%   Vitals:   11/25/18 1930 11/25/18 2044 11/25/18 2112 11/26/18 0501  BP: 104/81 114/66  101/90  Pulse: 75 85  74  Resp: 19 18  18   Temp:  97.7 F (36.5 C)  98.8 F (37.1 C)  TempSrc:  Oral    SpO2: 91% 95%  95%  Weight:   80 kg   Height:   5\' 8"  (1.727 m)     General: Pt is alert, awake, not in acute distress Cardiovascular: RRR, S1/S2 +, no rubs, no gallops Respiratory: Decreased breath sounds at bases with rales, no wheezing Abdominal: Soft, NT, ND, bowel sounds + Extremities: no edema, no cyanosis    The results of significant diagnostics from this  hospitalization (including imaging, microbiology, ancillary and laboratory) are listed below for reference.     Microbiology: No results found for this or any previous visit (from the past 240 hour(s)).   Labs: BNP (last 3 results) Recent Labs    01/31/18 0153 11/25/18 1634  BNP 1,472.0* 7,342.8*   Basic Metabolic Panel: Recent Labs  Lab 11/25/18 1335 11/25/18 1604 11/26/18 0700  NA 136 135 137  K 4.3 4.0 3.6  CL 101 100 98  CO2 26 25 28   GLUCOSE 100* 99 95  BUN 31* 34* 35*  CREATININE 2.02* 2.00* 2.03*  CALCIUM 9.8 9.4 9.6  MG  --   --  2.2   Liver Function Tests: Recent Labs  Lab 11/25/18 1335  AST 17  ALT 25  ALKPHOS 81  BILITOT 1.1  PROT 7.3  ALBUMIN 3.5   No results for input(s): LIPASE, AMYLASE in the last 168 hours. No results for input(s): AMMONIA in the last 168 hours. CBC: Recent Labs  Lab 11/25/18 1335 11/25/18 1634 11/26/18 1021  WBC 7.7 7.4 6.9  NEUTROABS 4.6 4.1  --   HGB 7.6* 7.5* 7.9*  HCT 23.5* 23.6* 25.9*  MCV 100.9* 101.3* 104.0*  PLT 54* 49* 58*   Cardiac Enzymes: Recent Labs  Lab 11/25/18 1604  TROPONINI <0.03   BNP: Invalid input(s): POCBNP CBG: No results for input(s): GLUCAP in the last 168 hours. D-Dimer No results for input(s): DDIMER in the last 72 hours. Hgb A1c No results for input(s): HGBA1C in the last 72 hours. Lipid Profile No results for input(s): CHOL, HDL, LDLCALC, TRIG, CHOLHDL, LDLDIRECT in the last 72 hours. Thyroid function studies No results for input(s): TSH, T4TOTAL, T3FREE, THYROIDAB in the last 72 hours.  Invalid input(s): FREET3 Anemia work up Recent Labs    11/26/18 0700  VITAMINB12 856  FOLATE 14.9  FERRITIN 613*  TIBC 300  IRON 111  RETICCTPCT 2.6   Urinalysis    Component Value Date/Time   COLORURINE STRAW (A) 11/25/2018 [place] 11/25/2018 1617   LABSPEC 1.006 11/25/2018 1617   PHURINE 7.0 11/25/2018 1617   GLUCOSEU NEGATIVE 11/25/2018 1617   HGBUR SMALL  (A) 11/25/2018 1617   HGBUR negative 10/15/2010 1251   BILIRUBINUR NEGATIVE 11/25/2018 1617   BILIRUBINUR n 12/01/2016 1521   KETONESUR NEGATIVE 11/25/2018 1617   PROTEINUR NEGATIVE 11/25/2018 1617   UROBILINOGEN 0.2 12/01/2016 1521   UROBILINOGEN 0.2 10/15/2010 1251   NITRITE NEGATIVE 11/25/2018 1617   LEUKOCYTESUR SMALL (A) 11/25/2018 1617     SIGNED:   Cordelia Poche, MD [facility] 11/26/2018, 12:40 PM

## 2018-11-26 NOTE — Progress Notes (Signed)
On call provider notified of pt's new onset confusion, pt reoriented. Pt restless and removing heart monitor repeatedly. Pt currently in ventricular bigeminy with some couplets overlapping. Lab work pending including magnesium level. Will continue to monitor closely.

## 2018-11-26 NOTE — Progress Notes (Signed)
SATURATION QUALIFICATIONS: (This note is used to comply with regulatory documentation for home oxygen)  Patient Saturations on Room Air at Rest = 99%  Patient Saturations on Room Air while Ambulating = 95-100%  Patient Saturations on n/a Liters of oxygen while Ambulating = n/a  Please briefly explain why patient needs home oxygen: oxygen not required, patient maintains oxygen levels in the mid to high 90's on room air.

## 2018-11-26 NOTE — Progress Notes (Signed)
On call provider notified of pt's wide complex QRS (0.14), PR of 0.34, and frequent multifocal PVCs going into ventricular bigeminy, trigeminy, and non-sustained VTach. Awaiting orders/call back if an EKG and/or more labs would be indicated. Will continue to monitor patient closely.

## 2018-11-26 NOTE — Plan of Care (Signed)
Discharge instructions reviewed with patient and wife, questions answered, verbalized understanding.  CHF booklet reviewed with wife and patient, patient understands the importance of daily weights and to notify his physician if he gains 3 lbs overnight or 5 lbs in a week.  Also discussed increasing shortness of breath and edema as reasons to notify provider. Reviewed low sodium diet.  Patient transported via wheelchair to main entrance to be taken home by wife.

## 2018-11-26 NOTE — Discharge Instructions (Signed)
Charles Coffey,  You were admitted because of trouble breathing. This was likely secondary to too much fluid building up in your lungs from heart failure. You were given IV lasix which has improved your symptoms. Please resume your lasix, and if you gain three pounds over 24 hours, please take Lasix 80 mg twice daily for one day and call your cardiologist for instructions. Please weigh yourself every day. Your blood count was slightly low, but settled just below 8. This may have contributed to your heart failure, but since it is back up, I will hold off on a blood transfusion. Please follow-up with your doctor as you may need to eventually have a transfusion if your blood count does not continue to improve.

## 2018-11-28 ENCOUNTER — Telehealth: Payer: Self-pay | Admitting: *Deleted

## 2018-11-28 NOTE — Telephone Encounter (Signed)
Spoke with wife and she declines a TCM/ follow up hospital visit at this time.  She states that he will keep his follow up appointment with his cardiologist. Please advise

## 2018-11-29 ENCOUNTER — Encounter: Payer: Self-pay | Admitting: Neurology

## 2018-11-29 ENCOUNTER — Other Ambulatory Visit: Payer: Self-pay

## 2018-11-29 ENCOUNTER — Ambulatory Visit (INDEPENDENT_AMBULATORY_CARE_PROVIDER_SITE_OTHER): Payer: Medicare HMO | Admitting: Neurology

## 2018-11-29 VITALS — BP 108/60 | HR 73 | Ht 68.0 in | Wt 179.0 lb

## 2018-11-29 DIAGNOSIS — G3184 Mild cognitive impairment, so stated: Secondary | ICD-10-CM

## 2018-11-29 MED ORDER — PAROXETINE HCL 10 MG PO TABS: 10.0000 mg | ORAL_TABLET | Freq: Every day | ORAL | 11 refills | Status: DC

## 2018-11-29 NOTE — Progress Notes (Signed)
NEUROLOGY FOLLOW UP OFFICE NOTE  Charles Coffey [id number] 11-28-1935  HISTORY OF PRESENT ILLNESS: I had the pleasure of seeing Charles Coffey in follow-up in the neurology clinic on 11/29/2018.  The patient was last seen 6 months ago for memory loss. He is again accompanied by his wife who helps supplement the history today. MOCA score 28/30 in May 2109. Records and images were personally reviewed where available. He has been admitted to the hospital several times since his last visit, most recently last weekend for acute on chronic CHF, Lasix dose increased. Records from his oncologist were reviewed, he has high-grade urothelial carcinoma involving the genitourinary tract on supportive care given poor tolerance of chemotherapy and immunotherapy. On his initial visit, we discussed memory issues likely multifactorial, they have discontinued the Trazodone. His wife reports worsening cognition with each admission for UTI. She manages his medications and finances. He has not been driving. Their main concern today is significant itching that started after Charles Coffey treatment. Despite last treatment in July, he continues to have significant itching but only at night, causing sleep difficulties. He has not slept more than 3 hours at night and drifts off to sleep several times during today's visit, he is easily arousable. They have seen a dermatologist and tried several antihistamines and creams with minimal effect. His wife wonders about a psychological cause/anxiety as it only occurs at night. She reports he gets going and cannot stop, getting anxious to the point that his skin is raw and he is tearing off his colostomy bag and nephrostomy tubes. He denies any headaches, dizziness, vision changes, no falls.  History on Initial Assessment 05/04/2018: This is a pleasant 83 year old right-handed man with a history of hypertension, hyperlipidemia, CAD s/p MI, RLS, stage IV urothelial carcinoma diagnosed in  November 2018. His tumor involves the urinary bladder and GU tract, including retroperitoneal adenopathy. He has undergone surgery and chemotherapy, and continues to deal with significant fatigue, pain, as well as episodic confusion and word-finding difficulties. He feels his memory is compromised a bit, but not bad. He noticed minor memory issues prior to his cancer diagnosis, but now forgets things more. He denies getting lost driving. His wife reports that he had always been in charge of bills, but since his recurrent hospital stays, around 6 months ago started having confusion with payments coming out of the wrong accounts. They would get notices that bills were not paid or there was no money in the account. She also took over his medications. Prior to his illness, he was very cognizant of taking his medications. His wife has to explain or repeat the question several times during the visit. She states that he has had no memory issues prior to his illness, but since then cognitive issues have been progressive. She wonders if it is medication-related and held his Oxycontin. She noticed a difference in cognition after he had been off Oxycontin for 1-2 weeks, then he had another UTI with horrible bladder pains and took one dose last night. She has noticed he has become very OCD with locking doors. She has noticed difficulties carrying out a conversation, he would intersperse irrelevant issues such as something they talked about last month. His reasoning skills are not what they used to be. He has trouble getting into the computer. She has noticed his vocal pattern/intonation has changed and gone back to when "[place] Over the past 4-5 months, he has been taking Trazodone for sleep.  His wife was also giving him either THC or CBD which seemed to help sleep as well. He takes Percocet 1-2 times a day, Dicyclomine at least 2 times a day, sometimes up to 4 a day. He had hallucinations when he had  a UTI, this cleared up after a couple of days in the hospital. He is currently on antibiotic treatment again until tomorrow.   His wife is very concerned about his fatigue. He has a low grade sensation of lightheadedness that started when he got ill, worse in the mornings. He denies any headaches. He feels like "everything in the room would get a little brighter." He has had intermittent visual symptoms of seeing lights flashing, not associated with worsening confusion, and has seen an eye doctor. No diplopia, dysarthria/dysphagia. He has occasional left hand numbness and tingling and cannot hold on very long to things. He has chronic back pain. He has occasional tremors in both hands, L>R and has difficulty writing sometimes because of the jerkiness. He feels Mirapex is helping with his RLS. He has 3 brother with dementia. No history of significant concussions. He drinks alcohol occasionally.   I personally reviewed MRI brain without contrast done 04/11/18 which did not show any acute changes. There was moderate diffuse atrophy and mild chronic microvascular disease.   PAST MEDICAL HISTORY: Past Medical History:  Diagnosis Date  . Aortic atherosclerosis ([place])   . BPH with urinary obstruction   . CAD (coronary artery disease)    a.  MI 1995, CABG x 3 2002 (patient says that he had LIMA and RIMA grafts). b. ETT-Cardiolite (10/15) with EF 48%, apical scar, no ischemia. c. Nuc 07/2017 abnormal -> cath was performed,  patent LIMA to LAD and SVG to OM 2. RIMA to RCA is atretic. The right coronary artery is occluded distally with extensive collaterals from the LAD, medical therapy.   . Cancer [facility])    bladder cancer  . Cervical spondylosis without myelopathy 11/21/202017  . Chronic low back pain    sees Dr. Kary Kos   . GERD (gastroesophageal reflux disease)   . Hiatal hernia   . Hyperlipidemia   . Hypertension   . IBS (irritable bowel syndrome)   . Ischemic cardiomyopathy   . Myocardial infarction  ([place])   . RBBB   . Restless legs syndrome   . Statin intolerance   . Syncope    a. in 2015 - no apparent cause, was taking sleep medicine at the time. Cardiac workup unremarkable.  . Tubular adenoma of colon     MEDICATIONS: Current Outpatient Medications on File Prior to Visit  Medication Sig Dispense Refill  . carvedilol (COREG) 3.125 MG tablet Take 3.125 mg by mouth 2 (two) times daily with a meal.    . DULoxetine (CYMBALTA) 20 MG capsule Take 1 capsule (20 mg total) by mouth 2 (two) times daily. Take once daily for 2 weeks then increase to twice daily    . famotidine (PEPCID) 20 MG tablet Take 1 tablet (20 mg total) by mouth 2 (two) times daily. 360 tablet 2  . feeding supplement (BOOST HIGH PROTEIN) LIQD Take 1 Container by mouth 2 (two) times daily between meals.    . furosemide (LASIX) 40 MG tablet Take 1 tablet (40 mg total) by mouth daily as needed for fluid or edema.    . hydrOXYzine (ATARAX/VISTARIL) 25 MG tablet Take 1 tablet (25 mg total) by mouth every 4 (four) hours as needed for itching. 100 tablet 2  .  lidocaine-prilocaine (EMLA) cream Apply 1 application topically as needed. 30 g 3  . mirabegron ER (MYRBETRIQ) 25 MG TB24 tablet Take 25 mg by mouth daily.    . nitroGLYCERIN (NITROSTAT) 0.4 MG SL tablet PLACE 1 TABLET (0.4 MG TOTAL) UNDER THE TONGUE EVERY 5 (FIVE) MINUTES AS NEEDED. 25 tablet 0  . oxyCODONE (OXYCONTIN) 10 mg 12 hr tablet Take 1 tablet (10 mg total) by mouth at bedtime.    Marland Kitchen oxyCODONE-acetaminophen (PERCOCET) 10-325 MG tablet Take 1 tablet by mouth every 4 (four) hours as needed for pain. 90 tablet 0  . polyvinyl alcohol (LIQUIFILM TEARS) 1.4 % ophthalmic solution Place 1 drop into both eyes 2 (two) times daily as needed (dry eyes). (Patient not taking: Reported on 11/25/2018) 15 mL 0  . pramipexole (MIRAPEX) 1 MG tablet TAKE 1 AND 1/2 TABLETS BY MOUTH AT BEDTIME (Patient taking differently: Take 1.5 mg by mouth every evening. ) 135 tablet 3  . triamcinolone  cream (KENALOG) 0.1 % Apply topically 3 (three) times daily. To affected areas 30 g 0   No current facility-administered medications on file prior to visit.     ALLERGIES: Allergies  Allergen Reactions  . Statins Other (See Comments)    liver effects  . Cyproheptadine Other (See Comments)    Unable to urinate    FAMILY HISTORY: Family History  Problem Relation Age of Onset  . Heart attack Mother   . Aneurysm Father        femoral artery  . Heart disease Brother   . Heart disease Maternal Uncle        x 2  . Colon cancer Neg Hx   . Esophageal cancer Neg Hx   . Pancreatic cancer Neg Hx   . Kidney disease Neg Hx   . Liver disease Neg Hx     SOCIAL HISTORY: Social History   Socioeconomic History  . Marital status: Married    Spouse name: Not on file  . Number of children: 5  . Years of education: Some coll  . Highest education level: Not on file  Occupational History  . Occupation: retired  Scientific laboratory technician  . Financial resource strain: Not on file  . Food insecurity:    Worry: Not on file    Inability: Not on file  . Transportation needs:    Medical: Not on file    Non-medical: Not on file  Tobacco Use  . Smoking status: Former Smoker    Types: Cigarettes    Last attempt to quit: 12/28/1978    Years since quitting: 39.9  . Smokeless tobacco: Never Used  Substance and Sexual Activity  . Alcohol use: Not Currently    Alcohol/week: 2.0 standard drinks    Types: 1 Glasses of wine, 1 Cans of beer per week    Comment: occassional  . Drug use: No  . Sexual activity: Not Currently  Lifestyle  . Physical activity:    Days per week: Not on file    Minutes per session: Not on file  . Stress: Not on file  Relationships  . Social connections:    Talks on phone: Not on file    Gets together: Not on file    Attends religious service: Not on file    Active member of club or organization: Not on file    Attends meetings of clubs or organizations: Not on file     Relationship status: Not on file  . Intimate partner violence:    Fear of  current or ex partner: Not on file    Emotionally abused: Not on file    Physically abused: Not on file    Forced sexual activity: Not on file  Other Topics Concern  . Not on file  Social History Narrative   Lives at home w/ his wife   Right-handed   5 cups of coffee per day    REVIEW OF SYSTEMS: Constitutional: No fevers, chills, or sweats, no generalized fatigue, change in appetite Eyes: No visual changes, double vision, eye pain Ear, nose and throat: No hearing loss, ear pain, nasal congestion, sore throat Cardiovascular: No chest pain, palpitations Respiratory:  No shortness of breath at rest or with exertion, wheezes GastrointestinaI: No nausea, vomiting, diarrhea, abdominal pain, fecal incontinence Genitourinary:  No dysuria, urinary retention or frequency Musculoskeletal:  No neck pain, back pain Integumentary: No rash, +pruritus Neurological: as above Psychiatric: No depression, insomnia, anxiety Endocrine: No palpitations, fatigue, diaphoresis, mood swings, change in appetite, change in weight, increased thirst Hematologic/Lymphatic:  No anemia, purpura, petechiae. Allergic/Immunologic: no itchy/runny eyes, nasal congestion, recent allergic reactions, rashes  PHYSICAL EXAM: Vitals:   11/29/18 1506  BP: 108/60  Pulse: 73  SpO2: 98%   General: No acute distress Head:  Normocephalic/atraumatic Neck: supple, no paraspinal tenderness, full range of motion Heart:  Regular rate and rhythm Lungs:  Clear to auscultation bilaterally Back: No paraspinal tenderness Skin/Extremities: No rash, no edema Neurological Exam: alert and oriented to person, place, and states month is November. No aphasia or dysarthria. Fund of knowledge is appropriate.  Recent and remote memory are impaired.  Attention and concentration are normal.    Able to name objects and repeat phrases.  Montreal Cognitive Assessment   11/29/2018 05/04/2018  Visuospatial/ Executive (0/5) 5 5  Naming (0/3) 3 3  Attention: Read list of digits (0/2) 2 2  Attention: Read list of letters (0/1) 0 1  Attention: Serial 7 subtraction starting at 100 (0/3) 3 3  Language: Repeat phrase (0/2) 2 2  Language : Fluency (0/1) 1 0  Abstraction (0/2) 2 2  Delayed Recall (0/5) 3 4  Orientation (0/6) 4 6  Total 25 28   Cranial nerves: Pupils equal, round, reactive to light. Extraocular movements intact with no nystagmus. Visual fields full. Facial sensation intact. No facial asymmetry. Tongue, uvula, palate midline.  Motor: Bulk and tone normal, muscle strength 5/5 throughout with no pronator drift.  Sensation to light touch intact.  No extinction to double simultaneous stimulation. Finger to nose testing intact.  Gait narrow-based and steady, able to tandem walk adequately.  Romberg negative.  IMPRESSION: This is an 83 yo RH man with a history of hypertension, hyperlipidemia, CAD s/p MI, RLS, stage IV urothelial carcinoma s/p surgery and chemotherapy, with cognitive changes that started after his illness. His wife denied any prior cognitive issues before November 2018. She reports worsening cognition with each admission for UTI recently. MOCA score today 25/30 (28/30 in May 2019), however I discussed with them that he is significantly sleep deprived, which can affect current testing. Their main concern today is the pruritus that only occurs at night, they are willing to try anything, there is some evidence that Paxil helps with pruritus, and if there is an anxiety component, this may help as well. He will wean off Cymbalta and start Paxil 10mg  qhs. Side effects discussed. Continue all other medications. We again discussed the importance of control of vascular risk factors, physical exercise, and brain stimulation exercises for brain health. Follow-up  in 6 months, they know to call for any changes.   Thank you for allowing me to participate in his  care.  Please do not hesitate to call for any questions or concerns.  The duration of this appointment visit was 30 minutes of face-to-face time with the patient.  Greater than 50% of this time was spent in counseling, explanation of diagnosis, planning of further management, and coordination of care.   Ellouise Newer, M.D.   CC: Dr. Sarajane Jews, Dr. [name]

## 2018-11-29 NOTE — Patient Instructions (Addendum)
1. Reduce Cymbalta (Duloxetine) 20mg : take 1 tablet daily for a week, then stop 2. Start Paroxetine 10mg  at bedtime 3. Continue all your medications 4. Follow-up in 6 months, call for any changes  FALL PRECAUTIONS: Be cautious when walking. Scan the area for obstacles that may increase the risk of trips and falls. When getting up in the mornings, sit up at the edge of the bed for a few minutes before getting out of bed. Consider elevating the bed at the head end to avoid drop of blood pressure when getting up. Walk always in a well-lit room (use night lights in the walls). Avoid area rugs or power cords from appliances in the middle of the walkways. Use a walker or a cane if necessary and consider physical therapy for balance exercise. Get your eyesight checked regularly.  FINANCIAL OVERSIGHT: Supervision, especially oversight when making financial decisions or transactions is also recommended.  HOME SAFETY: Consider the safety of the kitchen when operating appliances like stoves, microwave oven, and blender. Consider having supervision and share cooking responsibilities until no longer able to participate in those. Accidents with firearms and other hazards in the house should be identified and addressed as well.  DRIVING: Regarding driving, in patients with progressive memory problems, driving will be impaired. We advise to have someone else do the driving if trouble finding directions or if minor accidents are reported. Independent driving assessment is available to determine safety of driving.  ABILITY TO BE LEFT ALONE: If patient is unable to contact 911 operator, consider using LifeLine, or when the need is there, arrange for someone to stay with patients. Smoking is a fire hazard, consider supervision or cessation. Risk of wandering should be assessed by caregiver and if detected at any point, supervision and safe proof recommendations should be instituted.  MEDICATION SUPERVISION: Inability to  self-administer medication needs to be constantly addressed. Implement a mechanism to ensure safe administration of the medications.  RECOMMENDATIONS FOR ALL PATIENTS WITH MEMORY PROBLEMS: 1. Continue to exercise (Recommend 30 minutes of walking everyday, or 3 hours every week) 2. Increase social interactions - continue going to [place] and enjoy social gatherings with friends and family 3. Eat healthy, avoid fried foods and eat more fruits and vegetables 4. Maintain adequate blood pressure, blood sugar, and blood cholesterol level. Reducing the risk of stroke and cardiovascular disease also helps promoting better memory. 5. Avoid stressful situations. Live a simple life and avoid aggravations. Organize your time and prepare for the next day in anticipation. 6. Sleep well, avoid any interruptions of sleep and avoid any distractions in the bedroom that may interfere with adequate sleep quality 7. Avoid sugar, avoid sweets as there is a strong link between excessive sugar intake, diabetes, and cognitive impairment The Mediterranean diet has been shown to help patients reduce the risk of progressive memory disorders and reduces cardiovascular risk. This includes eating fish, eat fruits and green leafy vegetables, nuts like almonds and hazelnuts, walnuts, and also use olive oil. Avoid fast foods and fried foods as much as possible. Avoid sweets and sugar as sugar use has been linked to worsening of memory function.  There is always a concern of gradual progression of memory problems. If this is the case, then we may need to adjust level of care according to patient needs. Support, both to the patient and caregiver, should then be put into place.

## 2018-11-30 NOTE — Telephone Encounter (Signed)
That would be fine 

## 2018-11-30 NOTE — Telephone Encounter (Signed)
Wife is aware

## 2018-12-01 ENCOUNTER — Ambulatory Visit (INDEPENDENT_AMBULATORY_CARE_PROVIDER_SITE_OTHER): Payer: Medicare HMO | Admitting: Internal Medicine

## 2018-12-01 ENCOUNTER — Encounter: Payer: Self-pay | Admitting: Neurology

## 2018-12-01 ENCOUNTER — Encounter: Payer: Self-pay | Admitting: Internal Medicine

## 2018-12-01 VITALS — BP 106/60 | HR 59 | Ht 68.0 in | Wt 175.0 lb

## 2018-12-01 DIAGNOSIS — I5022 Chronic systolic (congestive) heart failure: Secondary | ICD-10-CM

## 2018-12-01 DIAGNOSIS — I255 Ischemic cardiomyopathy: Secondary | ICD-10-CM

## 2018-12-01 DIAGNOSIS — R3914 Feeling of incomplete bladder emptying: Secondary | ICD-10-CM | POA: Diagnosis not present

## 2018-12-01 DIAGNOSIS — C670 Malignant neoplasm of trigone of bladder: Secondary | ICD-10-CM | POA: Diagnosis not present

## 2018-12-01 DIAGNOSIS — N401 Enlarged prostate with lower urinary tract symptoms: Secondary | ICD-10-CM | POA: Diagnosis not present

## 2018-12-01 DIAGNOSIS — I2510 Atherosclerotic heart disease of native coronary artery without angina pectoris: Secondary | ICD-10-CM | POA: Diagnosis not present

## 2018-12-01 DIAGNOSIS — N3000 Acute cystitis without hematuria: Secondary | ICD-10-CM | POA: Diagnosis not present

## 2018-12-01 NOTE — Patient Instructions (Signed)
Medication Instructions:  none If you need a refill on your cardiac medications before your next appointment, please call your pharmacy.   Lab work: none If you have labs (blood work) drawn today and your tests are completely normal, you will receive your results only by: Marland Kitchen MyChart Message (if you have MyChart) OR . A paper copy in the mail If you have any lab test that is abnormal or we need to change your treatment, we will call you to review the results.  Testing/Procedures: none  Follow-Up: 2 WEEKS WITH APP At [facility], you and your health needs are our priority.  As part of our continuing mission to provide you with exceptional heart care, we have created designated Provider Care Teams.  These Care Teams include your primary Cardiologist (physician) and Advanced Practice Providers (APPs -  Physician Assistants and Nurse Practitioners) who all work together to provide you with the care you need, when you need it. .   Any Other Special Instructions Will Be Listed Below (If Applicable). IF YOU GAIN MORE THAN 2 POUNDS IN A DAY OR 5 POUNDS IN 1 WEEK, INCREASE LASIX TO 80 mg IN THE MORNING AND 40 mg IN THE EVENING

## 2018-12-01 NOTE — Progress Notes (Signed)
Follow-up Outpatient Visit Date: 12/01/2018  Primary Care Provider: [name], MD [address] [place] [address]  Chief Complaint: Follow-up heart failure  HPI:  Mr. Cirelli is a 83 y.o. year-old male with history of  coronary artery disease status post remote MI and CABG in 2002, ischemic cardiomyopathy, hypertension, hyperlipidemia, IBS, restless leg syndrome, BPH,bladder cancer,GERD, renal insufficiency, anemia, and thrombocytopenia, who presents for follow-up of heart failure and CAD.  I last saw Mr. Noreen ~2 weeks ago, at which time he reported continued shortness of breath, edema, and persistent weight gain after preceding hospitalization for UTI complicated by acute on chronic systolic heart failure.  I advised him to increase furosemide back to BID dosing.  Unfortunately, he has since been readmitted to the hospital for heart failure.  The day after Thanksgiving (6 days ago), Mr. Rands experienced increasing fatigue and shortness of breath.  Home health nurse was concerned about his vital signs and ultimately referred him to the ED.  He was noted to be significantly volume overloaded.  He had been drinking significant amount of water to help "flush his kidneys."  He also consumed quite a bit of salt on Thanksgiving.  He received IV furosemide with significant diuresis in the hospital.  He is now back on furosemide 40 mg twice daily and seems to be maintaining a stable weight between 170 and 174 pounds.  He has not had significant edema since leaving the hospital over the weekend.  Mr. Dauphin has stable shortness of breath and two-pillow orthopnea.  He denies chest pain, palpitations, and lightheadedness.  His wife is concerned that Mr. Brailsford is somnolent and seems to nod off throughout the day.  --------------------------------------------------------------------------------------------------  Cardiovascular History & Procedures: Cardiovascular Problems:  Coronary  artery disease status post CABG (2002)  Ischemic cardiomyopathy  Syncope (2015 without recurrence)  Risk Factors:  Known CAD, carotid artery disease, hypertension, hyperlipidemia, age > 58  Cath/PCI:  LHC (08/25/17): LMCA 40% distal disease. LAD 40% proximal disease and 100% mid vessel occlusion. LCx with 40% OM1 disease and total occlusion of the mid vessel. RCA with mild diffuse disease and in-stent restenosis as well as chronic total occlusion of the distal vessel. LIMA to LAD widely patent. SVG to OM 2 patent with mild luminal irregularities. RIMA to PDA atretic. LVEDP 15-20 mmHg.  CV Surgery:  CABG (2002, [place]): LIMA->LAD, SVG->OM2, and RIMA->rPDA  EP Procedures and Devices:  None  Non-Invasive Evaluation(s):  Carotid Doppler (09/02/17): Heterogeneous plaque bilaterally with 1-39% internal carotid artery stenosis.  TTE (09/01/17): Normal LV size and wall thickness. Apical akinesis noted with LVEF of 50%. Grade 2 diastolic dysfunction. Moderately calcified aortic valve without stenosis or regurgitation. Mild annular calcification. Moderate mitral regurgitation. Mild left atrial enlargement. Normal RV size and function. Normal PA pressure.  Exercise MPI (08/23/17): High risk study. Moderate in size, severe, fixed apical anterior, septal, and mid anterior walls consistent with scar. Large, severe, reversible basal and mid inferolateral/inferior defect consistent with ischemia. LVEF 30-44%.  Bilateral lower extremity arterial studies (03/07/15): Normal ABIs (1.2 on the right, 1.1 on the left).  Recent CV Pertinent Labs: Lab Results  Component Value Date   CHOL 122 09/21/2017   HDL 41 09/21/2017   LDLCALC 65 09/21/2017   LDLDIRECT 72 09/21/2017   TRIG 79 09/21/2017   CHOLHDL 3.0 09/21/2017   CHOLHDL 3 12/01/2016   INR 1.18 11/25/2018   BNP 2,117.1 (H) 11/25/2018   K 3.6 11/26/2018   K 4.4 12/29/2017  MG 2.2 11/26/2018   BUN 35 (H) 11/26/2018   BUN 35 (H) 11/14/2018     BUN 31.5 (H) 12/29/2017   CREATININE 2.03 (H) 11/26/2018   CREATININE 2.02 (H) 11/25/2018   CREATININE 1.6 (H) 12/29/2017    Past medical and surgical history were reviewed and updated in EPIC.  Current Meds  Medication Sig  . carvedilol (COREG) 3.125 MG tablet Take 3.125 mg by mouth 2 (two) times daily with a meal.  . famotidine (PEPCID) 20 MG tablet Take 1 tablet (20 mg total) by mouth 2 (two) times daily.  . feeding supplement (BOOST HIGH PROTEIN) LIQD Take 1 Container by mouth 2 (two) times daily between meals.  . furosemide (LASIX) 40 MG tablet Take 40 mg by mouth 2 (two) times daily.  . hydrOXYzine (ATARAX/VISTARIL) 25 MG tablet Take 1 tablet (25 mg total) by mouth every 4 (four) hours as needed for itching.  . lidocaine-prilocaine (EMLA) cream Apply 1 application topically as needed.  . mirabegron ER (MYRBETRIQ) 25 MG TB24 tablet Take 25 mg by mouth daily.  . nitroGLYCERIN (NITROSTAT) 0.4 MG SL tablet PLACE 1 TABLET (0.4 MG TOTAL) UNDER THE TONGUE EVERY 5 (FIVE) MINUTES AS NEEDED.  Marland Kitchen oxyCODONE (OXYCONTIN) 10 mg 12 hr tablet Take 1 tablet (10 mg total) by mouth at bedtime.  Marland Kitchen oxyCODONE-acetaminophen (PERCOCET) 10-325 MG tablet Take 1 tablet by mouth every 4 (four) hours as needed for pain.  Marland Kitchen PARoxetine (PAXIL) 10 MG tablet Take 1 tablet (10 mg total) by mouth daily.  . polyvinyl alcohol (LIQUIFILM TEARS) 1.4 % ophthalmic solution Place 1 drop into both eyes 2 (two) times daily as needed (dry eyes).  . pramipexole (MIRAPEX) 1 MG tablet TAKE 1 AND 1/2 TABLETS BY MOUTH AT BEDTIME (Patient taking differently: Take 1.5 mg by mouth every evening. )  . triamcinolone cream (KENALOG) 0.1 % Apply topically 3 (three) times daily. To affected areas  . [DISCONTINUED] furosemide (LASIX) 40 MG tablet Take 1 tablet (40 mg total) by mouth daily as needed for fluid or edema. (Patient taking differently: Take 40 mg by mouth 2 (two) times daily. )    Allergies: Statins and Cyproheptadine  Social  History   Tobacco Use  . Smoking status: Former Smoker    Types: Cigarettes    Last attempt to quit: 12/28/1978    Years since quitting: 39.9  . Smokeless tobacco: Never Used  Substance Use Topics  . Alcohol use: Not Currently    Alcohol/week: 2.0 standard drinks    Types: 1 Glasses of wine, 1 Cans of beer per week    Comment: occassional  . Drug use: No    Family History  Problem Relation Age of Onset  . Heart attack Mother   . Aneurysm Father        femoral artery  . Heart disease Brother   . Heart disease Maternal Uncle        x 2  . Colon cancer Neg Hx   . Esophageal cancer Neg Hx   . Pancreatic cancer Neg Hx   . Kidney disease Neg Hx   . Liver disease Neg Hx     Review of Systems: A 12-system review of systems was performed and was negative except as noted in the HPI.  --------------------------------------------------------------------------------------------------  Physical Exam: BP 106/60   Pulse (!) 59   Ht 5\' 8"  (1.727 m)   Wt 175 lb (79.4 kg)   SpO2 99%   BMI 26.61 kg/m   General: Chronically ill-appearing  man, seated comfortably in the exam room.  He is accompanied by his wife. HEENT: No conjunctival pallor or scleral icterus. Moist mucous membranes.  OP clear. Neck: Supple without lymphadenopathy, thyromegaly, JVD, or HJR.  Lungs: Normal work of breathing. Clear to auscultation bilaterally without wheezes or crackles. Heart: Bradycardic but regular with 1/6 holosystolic murmur.  No rubs or gallops.  Non-displaced PMI. Abd: Bowel sounds present. Soft, NT/ND without hepatosplenomegaly.  Bilateral nephrostomy tubes in place. Ext: No lower extremity edema. Skin: Warm and dry without rash.  EKG (11/25/2018 -personally reviewed): Normal sinus rhythm with first-degree AV block and PVCs.  Right bundle branch block and poor R wave progression.  Lab Results  Component Value Date   WBC 6.9 11/26/2018   HGB 7.9 (L) 11/26/2018   HCT 25.9 (L) 11/26/2018   MCV  104.0 (H) 11/26/2018   PLT 58 (L) 11/26/2018    Lab Results  Component Value Date   NA 137 11/26/2018   K 3.6 11/26/2018   CL 98 11/26/2018   CO2 28 11/26/2018   BUN 35 (H) 11/26/2018   CREATININE 2.03 (H) 11/26/2018   GLUCOSE 95 11/26/2018   ALT 25 11/25/2018    Lab Results  Component Value Date   CHOL 122 09/21/2017   HDL 41 09/21/2017   LDLCALC 65 09/21/2017   LDLDIRECT 72 09/21/2017   TRIG 79 09/21/2017   CHOLHDL 3.0 09/21/2017    --------------------------------------------------------------------------------------------------  ASSESSMENT AND PLAN: Chronic systolic heart failure due to ischemic cardiomyopathy Unfortunately, Mr. Liberto has continued to have difficult to control volume status.  I suspect his recent hospitalization was due to dietary indiscretion with excess sodium and fluid intake.  I have reinforced the importance of sodium restriction as well as trying to limit his fluid intake to 2 L a day.  He appears euvolemic on exam today with NYHA class III symptoms.  We will continue furosemide 40 mg twice daily.  I have advised Mr. Lech to increase his morning dose to 80 mg if he gains more than 2 pounds in 24 hours or 5 pounds in a week.  We will continue carvedilol 3.125 mg twice daily.  Escalation of evidence-based heart failure therapy is precluded by soft blood pressure and chronic kidney disease.  Coronary artery disease No chest pain reported by Mr. Purohit.  I suspect his dyspnea experienced last week was more related to heart failure than worsening coronary insufficiency.  While I think severe CAD underlies his heart failure, he is not a candidate for catheterization due to multiple comorbidities including chronic kidney disease and severe anemia requiring blood transfusions in the setting of advanced bladder cancer.  Follow-up: Return to clinic in 2 weeks with APP.  Given my transition to [place], I will have Mr. Diffley follow-up long-term with Dr.  Acie Fredrickson.  [name], MD 12/02/2018 7:41 AM

## 2018-12-02 ENCOUNTER — Encounter: Payer: Self-pay | Admitting: Internal Medicine

## 2018-12-02 ENCOUNTER — Telehealth: Payer: Self-pay | Admitting: Family Medicine

## 2018-12-02 DIAGNOSIS — R7881 Bacteremia: Secondary | ICD-10-CM | POA: Diagnosis not present

## 2018-12-02 DIAGNOSIS — Z8551 Personal history of malignant neoplasm of bladder: Secondary | ICD-10-CM | POA: Diagnosis not present

## 2018-12-02 DIAGNOSIS — B964 Proteus (mirabilis) (morganii) as the cause of diseases classified elsewhere: Secondary | ICD-10-CM | POA: Diagnosis not present

## 2018-12-02 DIAGNOSIS — I2510 Atherosclerotic heart disease of native coronary artery without angina pectoris: Secondary | ICD-10-CM | POA: Diagnosis not present

## 2018-12-02 DIAGNOSIS — Z951 Presence of aortocoronary bypass graft: Secondary | ICD-10-CM | POA: Diagnosis not present

## 2018-12-02 DIAGNOSIS — I5022 Chronic systolic (congestive) heart failure: Secondary | ICD-10-CM | POA: Diagnosis not present

## 2018-12-02 DIAGNOSIS — Z960 Presence of urogenital implants: Secondary | ICD-10-CM | POA: Diagnosis not present

## 2018-12-02 DIAGNOSIS — Z933 Colostomy status: Secondary | ICD-10-CM | POA: Diagnosis not present

## 2018-12-02 DIAGNOSIS — Z85038 Personal history of other malignant neoplasm of large intestine: Secondary | ICD-10-CM | POA: Diagnosis not present

## 2018-12-02 DIAGNOSIS — N390 Urinary tract infection, site not specified: Secondary | ICD-10-CM | POA: Diagnosis not present

## 2018-12-02 NOTE — Telephone Encounter (Signed)
Copied from [place] [id number]. Topic: Quick Communication - Home Health Verbal Orders >> Dec 02, 2018 12:35 PM Carolyn Stare wrote: Caller/Agency   Katie with [facility] Number  [phone]  Requesting  home health care extension for  plan of care    Frequency 1 x 2

## 2018-12-07 ENCOUNTER — Other Ambulatory Visit: Payer: Self-pay | Admitting: Urology

## 2018-12-07 DIAGNOSIS — N130 Hydronephrosis with ureteropelvic junction obstruction: Secondary | ICD-10-CM | POA: Diagnosis not present

## 2018-12-07 DIAGNOSIS — N1330 Unspecified hydronephrosis: Secondary | ICD-10-CM

## 2018-12-07 DIAGNOSIS — C662 Malignant neoplasm of left ureter: Secondary | ICD-10-CM | POA: Diagnosis not present

## 2018-12-07 DIAGNOSIS — C670 Malignant neoplasm of trigone of bladder: Secondary | ICD-10-CM | POA: Diagnosis not present

## 2018-12-08 ENCOUNTER — Telehealth: Payer: Self-pay | Admitting: Family Medicine

## 2018-12-08 NOTE — Telephone Encounter (Signed)
I have called and lmom for Charles Coffey with [facility]--VO given to continue home health care extension.  Advised Charles Coffey to call us back.

## 2018-12-08 NOTE — Telephone Encounter (Signed)
Copied from [place] [id number]. Topic: Quick Communication - See Telephone Encounter >> Dec 08, 2018  5:17 PM Blase Mess A wrote: Patients wife is calling requesting Dr. Sarajane Jews to give ok  to [facility] for colostomy bags and medical supply. [facility] has been been requesting since 11/29/18.  The patient is out of supplies. Please advise [phone]

## 2018-12-09 NOTE — Telephone Encounter (Signed)
Pt wife [name] [phone] calling back stating that [facility] has been waiting for Dr Sarajane Jews to respond back to them since December 8th about supplies wife is  upset because nothing has been done to get those supplies

## 2018-12-09 NOTE — Telephone Encounter (Signed)
Dr. Fry please advise. Thanks  

## 2018-12-09 NOTE — Telephone Encounter (Signed)
I have called [facility] and left a message on the VM to get these supplies out to the pt asap since he is out of these.

## 2018-12-09 NOTE — Telephone Encounter (Signed)
Please okay these supplies

## 2018-12-11 DIAGNOSIS — Z951 Presence of aortocoronary bypass graft: Secondary | ICD-10-CM | POA: Diagnosis not present

## 2018-12-11 DIAGNOSIS — Z8551 Personal history of malignant neoplasm of bladder: Secondary | ICD-10-CM | POA: Diagnosis not present

## 2018-12-11 DIAGNOSIS — I2510 Atherosclerotic heart disease of native coronary artery without angina pectoris: Secondary | ICD-10-CM | POA: Diagnosis not present

## 2018-12-11 DIAGNOSIS — R7881 Bacteremia: Secondary | ICD-10-CM | POA: Diagnosis not present

## 2018-12-11 DIAGNOSIS — Z960 Presence of urogenital implants: Secondary | ICD-10-CM | POA: Diagnosis not present

## 2018-12-11 DIAGNOSIS — Z85038 Personal history of other malignant neoplasm of large intestine: Secondary | ICD-10-CM | POA: Diagnosis not present

## 2018-12-11 DIAGNOSIS — I5022 Chronic systolic (congestive) heart failure: Secondary | ICD-10-CM | POA: Diagnosis not present

## 2018-12-11 DIAGNOSIS — B964 Proteus (mirabilis) (morganii) as the cause of diseases classified elsewhere: Secondary | ICD-10-CM | POA: Diagnosis not present

## 2018-12-11 DIAGNOSIS — N390 Urinary tract infection, site not specified: Secondary | ICD-10-CM | POA: Diagnosis not present

## 2018-12-11 DIAGNOSIS — Z933 Colostomy status: Secondary | ICD-10-CM | POA: Diagnosis not present

## 2018-12-12 ENCOUNTER — Encounter (HOSPITAL_COMMUNITY): Payer: Self-pay

## 2018-12-12 ENCOUNTER — Ambulatory Visit (HOSPITAL_COMMUNITY)
Admission: RE | Admit: 2018-12-12 | Discharge: 2018-12-12 | Disposition: A | Discharge status: Home / Self Care | Payer: Medicare HMO | Source: Ambulatory Visit | Attending: Urology | Admitting: Urology

## 2018-12-12 ENCOUNTER — Other Ambulatory Visit: Payer: Self-pay | Admitting: Physician Assistant

## 2018-12-12 ENCOUNTER — Telehealth: Payer: Self-pay | Admitting: Family Medicine

## 2018-12-12 ENCOUNTER — Telehealth (HOSPITAL_COMMUNITY): Payer: Self-pay | Admitting: *Deleted

## 2018-12-12 DIAGNOSIS — R339 Retention of urine, unspecified: Secondary | ICD-10-CM

## 2018-12-12 DIAGNOSIS — N1330 Unspecified hydronephrosis: Secondary | ICD-10-CM

## 2018-12-12 DIAGNOSIS — C662 Malignant neoplasm of left ureter: Secondary | ICD-10-CM

## 2018-12-12 DIAGNOSIS — C689 Malignant neoplasm of urinary organ, unspecified: Secondary | ICD-10-CM

## 2018-12-12 DIAGNOSIS — T83028A Displacement of other indwelling urethral catheter, initial encounter: Secondary | ICD-10-CM | POA: Diagnosis not present

## 2018-12-12 NOTE — Telephone Encounter (Signed)
Called and spoke with patients wife, we will reschedule procedure for Thursday.  Pt will come in today for PA to check drain as wife says the drainage has increased

## 2018-12-12 NOTE — Progress Notes (Signed)
Referring Physician(s): Irine Seal  Supervising Physician: Markus Daft  Patient Status:  [name]  Chief Complaint: Discharge from suprapubic catheter  Subjective:  Charles Coffey presents with his wife today for evaluation of purulent discharge from suprapubic catheter insertion site. He was originally scheduled for bilateral nephrostograms in IR today to evaluate for possible removal of bilateral nephrostomy tubes however this procedure had to be rescheduled due to emergent procedures in IR today. When called to reschedule patient's wife was very concerned about the drainage from his SP catheter and asked to be seen to evaluate need for antibiotic.  Per Charles Coffey wife he has not had any trouble with his bilateral PCNs and has not needed to use them for awhile. She reports that his HHN noted some possible discharge and hardness of the skin during care of his SP catheter yesterday and stated that she believed it needed to be assessed. Charles Coffey reports that he has a little bit of pain when his SP catheter insertion site is pressed on, however he is unsure if it's painful any other time. His wife states that he does not normally have pain, she has not noted any leakage of urine around the insertion site and has not noted any bleeding from insertion site or in urine collection bag. She reports he is followed by urology but they do not routinely look at his SP catheter. She and Charles Coffey both deny any fevers, chills, abdominal pain, nausea, vomiting, cough or dyspnea.  Allergies: Statins and Cyproheptadine  Medications: Prior to Admission medications   Medication Sig Start Date End Date Taking? Authorizing Provider  carvedilol (COREG) 3.125 MG tablet Take 3.125 mg by mouth 2 (two) times daily with a meal.    [provider]  famotidine (PEPCID) 20 MG tablet Take 1 tablet (20 mg total) by mouth 2 (two) times daily. 10/18/18   Wyatt Portela, MD  feeding supplement (BOOST HIGH  PROTEIN) LIQD Take 1 Container by mouth 2 (two) times daily between meals.    [provider]  furosemide (LASIX) 40 MG tablet Take 40 mg by mouth 2 (two) times daily.    [provider]  hydrOXYzine (ATARAX/VISTARIL) 25 MG tablet Take 1 tablet (25 mg total) by mouth every 4 (four) hours as needed for itching. 11/17/18   [name], MD  lidocaine-prilocaine (EMLA) cream Apply 1 application topically as needed. 10/18/18   Wyatt Portela, MD  mirabegron ER (MYRBETRIQ) 25 MG TB24 tablet Take 25 mg by mouth daily.    [provider]  nitroGLYCERIN (NITROSTAT) 0.4 MG SL tablet PLACE 1 TABLET (0.4 MG TOTAL) UNDER THE TONGUE EVERY 5 (FIVE) MINUTES AS NEEDED. 09/27/18 12/26/18  Charlie Pitter, PA-C  oxyCODONE (OXYCONTIN) 10 mg 12 hr tablet Take 1 tablet (10 mg total) by mouth at bedtime. 11/10/18   Eugenie Filler, MD  oxyCODONE-acetaminophen (PERCOCET) 10-325 MG tablet Take 1 tablet by mouth every 4 (four) hours as needed for pain. 11/15/18   Wyatt Portela, MD  PARoxetine (PAXIL) 10 MG tablet Take 1 tablet (10 mg total) by mouth daily. 11/29/18   Cameron Sprang, MD  polyvinyl alcohol (LIQUIFILM TEARS) 1.4 % ophthalmic solution Place 1 drop into both eyes 2 (two) times daily as needed (dry eyes). 08/24/18   Nita Sells, MD  pramipexole (MIRAPEX) 1 MG tablet TAKE 1 AND 1/2 TABLETS BY MOUTH AT BEDTIME Patient taking differently: Take 1.5 mg by mouth every evening.  06/29/18   Sarajane Jews,  [name], MD  triamcinolone cream (KENALOG) 0.1 % Apply topically 3 (three) times daily. To affected areas 07/04/18   Donne Hazel, MD     Vital Signs: There were no vitals taken for this visit.  Physical Exam Constitutional:      General: He is not in acute distress.    Appearance: He is not ill-appearing or diaphoretic.     Comments: Wife present during exam.  HENT:     Head: Normocephalic.  Abdominal:     General: There is no distension.     Palpations: Abdomen is soft.      Tenderness: There is no abdominal tenderness.     Comments: (+) colostomy  Genitourinary:    Comments: SP catheter in place, sutures in tact - some granulomatous tissue around suture and on dressing. No purulent drainage or bleeding noted on dressing. Unable to express any purulent material from insertion site. The skin is hardened around the SP tract however this is uniform and consistent with scar tissue from tract formation. There is no erythema, edema, warmth or palpable abscess present. Skin:    General: Skin is warm and dry.     Comments: Bilateral nephrostomies present - capped with paper tape wrapped around caps. No dressing around insertion sites. Bilateral insertion site clean, dry, intact.  Neurological:     Mental Status: He is alert.     Imaging: No results found.  Labs:  CBC: Recent Labs    11/14/18 1656 11/25/18 1335 11/25/18 1634 11/26/18 1021  WBC 6.7 7.7 7.4 6.9  HGB 8.2* 7.6* 7.5* 7.9*  HCT 24.4* 23.5* 23.6* 25.9*  PLT 64* 54* 49* 58*    COAGS: Recent Labs    12/27/17 1227  08/19/18 0813 08/22/18 1730 09/20/18 1211 11/25/18 1604  INR 1.13   < > 1.23 1.15 1.05 1.18  APTT 33  --  36 37* 34  --    < > = values in this interval not displayed.    BMP: Recent Labs    11/14/18 1656 11/25/18 1335 11/25/18 1604 11/26/18 0700  NA 137 136 135 137  K 3.9 4.3 4.0 3.6  CL 103 101 100 98  CO2 18* 26 25 28   GLUCOSE 96 100* 99 95  BUN 35* 31* 34* 35*  CALCIUM 8.9 9.8 9.4 9.6  CREATININE 1.87* 2.02* 2.00* 2.03*  GFRNONAA 33* 30* 30* 30*  GFRAA 38* 35* 35* 35*    LIVER FUNCTION TESTS: Recent Labs    09/23/18 0913 10/10/18 1113 11/06/18 1958 11/25/18 1335  BILITOT 0.8 0.9 1.3* 1.1  AST 17 16 17 17   ALT 19 13 13 25   ALKPHOS 61 57 58 81  PROT 6.9 7.2 6.9 7.3  ALBUMIN 3.2* 3.4* 3.4* 3.5    Assessment and Plan:  Patient originally scheduled for bilateral nephrostograms today however this procedure was rescheduled to 12/19 due to emergent  procedures in IR. Patient's wife requested evaluation of SP catheter due to concern for infection and possible need for antibiotic.  On exam today there is some granulomatous tissue present however myself and Dr. Anselm Pancoast were unable to express any purulent material from insertion site and hardness appears to be uniform around [facility] cath which is consistent with tract formation. Minimal pain was exhibited on palpation of insertion site and patient's wife denies any leakage of urine. Last SP exchange was performed on 11/08/18 by Dr. Earleen Newport.  No obvious infectious process at [facility] insertion site on exam today, patient denies any other constitutional  symptoms causing concern for infection. Discussed with patient and wife that an antibiotic is not warranted at this time however since he is to be seen in IR on 12/19 for bilateral nephrostograms we will plan on exchanging his SP catheter as well at that time. I will place an order for this today and inform IR staff.   All questions answered to patient and his wife's satisfaction, they will return on 12/19.   Electronically Signed: Joaquim Nam, PA-C 12/12/2018, 3:32 PM   I spent a total of 25 Minutes at the the patient's bedside AND on the patient's hospital floor or unit, greater than 50% of which was counseling/coordinating care for SP catheter evaluation.

## 2018-12-12 NOTE — Telephone Encounter (Signed)
Orders received and placed in the red folder to be signed by Dr. Sarajane Jews.  Thanks

## 2018-12-12 NOTE — Telephone Encounter (Signed)
Morey Hummingbird with [facility] calling following up on faxed orders for extension of care.  Pt needing continued nursing care for suprapubic cath and peristomal area. Continued care needed for pain and itching with cath. Order faxed 12/11/18 and they need this back ASAP.   Call back [phone] -- can leave detailed message   Will send to [place] to keep an eye out for these orders which were faxed.

## 2018-12-14 ENCOUNTER — Telehealth: Payer: Self-pay | Admitting: Family Medicine

## 2018-12-14 DIAGNOSIS — K624 Stenosis of anus and rectum: Secondary | ICD-10-CM | POA: Diagnosis not present

## 2018-12-14 DIAGNOSIS — Z933 Colostomy status: Secondary | ICD-10-CM | POA: Diagnosis not present

## 2018-12-14 NOTE — Telephone Encounter (Signed)
Done

## 2018-12-14 NOTE — Telephone Encounter (Signed)
Orders have been signed by Dr. Sarajane Jews and faxed back to the appropriate facility.

## 2018-12-14 NOTE — Telephone Encounter (Signed)
Treat with Neosporin and a Bandaid. Bring him in if they think this is necessary.

## 2018-12-14 NOTE — Telephone Encounter (Signed)
Copied from [place] [id number]. Topic: Quick Communication - Home Health Verbal Orders >> Dec 14, 2018 10:33 AM Percell Belt A wrote: Caller/Agency: Patty / [facility]  Callback Number: [phone] She is calling to report a fall, he fell 2 nights ago.  A small wound on the right side of his forehead.  She is not with pt now.  No other complaints

## 2018-12-14 NOTE — Telephone Encounter (Signed)
Please advise 

## 2018-12-14 NOTE — Telephone Encounter (Signed)
I have called and lmom for Deatra Ina with [facility].

## 2018-12-15 ENCOUNTER — Ambulatory Visit (HOSPITAL_COMMUNITY)
Admission: RE | Admit: 2018-12-15 | Discharge: 2018-12-15 | Disposition: A | Discharge status: Home / Self Care | Payer: Medicare HMO | Source: Ambulatory Visit | Attending: Physician Assistant | Admitting: Physician Assistant

## 2018-12-15 ENCOUNTER — Encounter (HOSPITAL_COMMUNITY): Payer: Self-pay

## 2018-12-15 DIAGNOSIS — C689 Malignant neoplasm of urinary organ, unspecified: Secondary | ICD-10-CM

## 2018-12-15 DIAGNOSIS — R339 Retention of urine, unspecified: Secondary | ICD-10-CM

## 2018-12-15 NOTE — Telephone Encounter (Signed)
Patty called back and she is aware of Dr. Murrell Redden recs of using the neosporin and to bring him in if needed.

## 2018-12-16 DIAGNOSIS — K624 Stenosis of anus and rectum: Secondary | ICD-10-CM | POA: Diagnosis not present

## 2018-12-16 DIAGNOSIS — Z933 Colostomy status: Secondary | ICD-10-CM | POA: Diagnosis not present

## 2018-12-19 ENCOUNTER — Telehealth: Payer: Self-pay | Admitting: *Deleted

## 2018-12-19 ENCOUNTER — Other Ambulatory Visit: Payer: Self-pay | Admitting: Medical

## 2018-12-19 DIAGNOSIS — I2510 Atherosclerotic heart disease of native coronary artery without angina pectoris: Secondary | ICD-10-CM | POA: Diagnosis not present

## 2018-12-19 DIAGNOSIS — Z933 Colostomy status: Secondary | ICD-10-CM | POA: Diagnosis not present

## 2018-12-19 DIAGNOSIS — Z951 Presence of aortocoronary bypass graft: Secondary | ICD-10-CM | POA: Diagnosis not present

## 2018-12-19 DIAGNOSIS — N390 Urinary tract infection, site not specified: Secondary | ICD-10-CM | POA: Diagnosis not present

## 2018-12-19 DIAGNOSIS — F419 Anxiety disorder, unspecified: Secondary | ICD-10-CM

## 2018-12-19 DIAGNOSIS — F331 Major depressive disorder, recurrent, moderate: Secondary | ICD-10-CM

## 2018-12-19 DIAGNOSIS — Z960 Presence of urogenital implants: Secondary | ICD-10-CM | POA: Diagnosis not present

## 2018-12-19 DIAGNOSIS — B964 Proteus (mirabilis) (morganii) as the cause of diseases classified elsewhere: Secondary | ICD-10-CM | POA: Diagnosis not present

## 2018-12-19 DIAGNOSIS — Z85038 Personal history of other malignant neoplasm of large intestine: Secondary | ICD-10-CM | POA: Diagnosis not present

## 2018-12-19 DIAGNOSIS — I5022 Chronic systolic (congestive) heart failure: Secondary | ICD-10-CM | POA: Diagnosis not present

## 2018-12-19 DIAGNOSIS — R109 Unspecified abdominal pain: Secondary | ICD-10-CM

## 2018-12-19 DIAGNOSIS — Z8551 Personal history of malignant neoplasm of bladder: Secondary | ICD-10-CM | POA: Diagnosis not present

## 2018-12-19 DIAGNOSIS — R7881 Bacteremia: Secondary | ICD-10-CM | POA: Diagnosis not present

## 2018-12-19 NOTE — Telephone Encounter (Signed)
Copied from [place] [id number]. Topic: General - Other >> Dec 19, 2018  3:21 PM Windy Kalata wrote: Reason for CRM: Patty from [facility] is calling to report that she is discharging him from home care as of today 12/19/18.  Patient continues to complain about dizziness and difficulty sleeping at night and he appeared jaundice today  Best call back is [phone]

## 2018-12-19 NOTE — Telephone Encounter (Signed)
Have him see me to evaluate the jaundice

## 2018-12-20 ENCOUNTER — Other Ambulatory Visit (HOSPITAL_COMMUNITY): Payer: Medicare HMO

## 2018-12-20 ENCOUNTER — Ambulatory Visit: Payer: Self-pay

## 2018-12-20 ENCOUNTER — Ambulatory Visit (INDEPENDENT_AMBULATORY_CARE_PROVIDER_SITE_OTHER): Payer: Medicare HMO | Admitting: Internal Medicine

## 2018-12-20 ENCOUNTER — Ambulatory Visit (HOSPITAL_COMMUNITY): Payer: Medicare HMO

## 2018-12-20 ENCOUNTER — Encounter: Payer: Self-pay | Admitting: Internal Medicine

## 2018-12-20 VITALS — BP 102/62 | HR 75 | Temp 97.6°F | Wt 182.2 lb

## 2018-12-20 DIAGNOSIS — D696 Thrombocytopenia, unspecified: Secondary | ICD-10-CM | POA: Diagnosis not present

## 2018-12-20 DIAGNOSIS — R238 Other skin changes: Secondary | ICD-10-CM | POA: Diagnosis not present

## 2018-12-20 DIAGNOSIS — D649 Anemia, unspecified: Secondary | ICD-10-CM | POA: Diagnosis not present

## 2018-12-20 DIAGNOSIS — L299 Pruritus, unspecified: Secondary | ICD-10-CM | POA: Diagnosis not present

## 2018-12-20 DIAGNOSIS — C679 Malignant neoplasm of bladder, unspecified: Secondary | ICD-10-CM | POA: Diagnosis not present

## 2018-12-20 NOTE — Telephone Encounter (Signed)
Pt wife called and stated that she was returning a call that the office made to her.  Per telephone note yesterday. Dr Sarajane Jews wanted the patient seen for the reported jaundice from the home health nurse. Pt wife states that she has not noticed any yellowing of her husbands eye's.  She says he was dehydrated yesterday and C/O cloudy urine and some dizziness but she encouraged fluids and the symptoms subsided.  She states that he is unable to sleep at night because his skin is itchy.  She states he has places that he has scratched and scratched. Appointment scheduled per protocol.  Care advice read to patient's wife.  Wife verbalized understanding of all instructions.  Reason for Disposition . Jaundice  Answer Assessment - Initial Assessment Questions 1. EYES: "Are the whites of the eyes yellow?"     havent notice 2. ONSET: "When did the jaundice begin?   unsure 3. FEVER: "Do you have a fever?" If so, ask: "What is it, how was it measured, and when did it start?"  no 4. VOMITING: "Have you been vomiting?"      no 5. DEHYDRATION: "Have you been able to keep any liquids down?" "When was the last time you urinated?" "Do you feel dizzy?"    He was has to take lasix and bladder CA urine cloudy yesterday and dizzy 6. ABDOMINAL PAIN: "Are you having any abdominal pain?" If yes, ask: "How bad is it?"  (e.g., mild, moderate, severe)  - MILD - doesn't interfere with normal activities   - MODERATE - interferes with normal activities or awakens from sleep   - SEVERE - patient doesn't want to move (R/O peritonitis)      no 7. CONTACT: "Have you been around anyone who was jaundiced?"     no 8. CAUSE: "What do you think is causing the jaundice?"     unsure 9. OTHER SYMPTOMS: "Do you have any other symptoms?" (e.g., shaking chills, itching, blood in stool)     Very bad itching 10. PREGNANCY: "Is there any chance you are pregnant?" "When was your last menstrual period?"       N/A  Protocols used:  [facility]

## 2018-12-20 NOTE — Progress Notes (Signed)
Chief Complaint  Patient presents with  . Jaundice    nurse came yesterday and said his coloring was off. pt wife states that he is very dizzy and has been itchy since starting chemo.     HPI: Charles Coffey 83 y.o. come in sent in by triage fromyesterday  Poss new onset jaundice.  He has a history of bladder cancer CHF with tenuous fluid balance among other.  He is having ongoing itching originally felt from chemotherapy treatment but is continuing and is problematic. Home health agency felt that he looked a little jaundiced and he has been feeling dizzy and lightheaded and thus appointment made today. PCP is not available today. He is followed by cardiology oncology and I believe urology. He has a colostomy.  He has had hospitalizations recently for UTI sepsis and possibly CHF. He has probable ischemic cardiomyopathy.  He is here with his spouse and daughter today. He is on antidepressant medicines nothing new tends to drift off fall asleep at times is drowsy.  This is not changed from baseline recently. There is no active bleeding but he is known to have low platelet count. No vomiting diarrhea.  ROS: See pertinent positives and negatives per HPI.  No current chest pain change in shortness of breath edema coming and going is getting daily weights and had extra Lasix doses. No new skin changes.  Generalized itching without a rash.   Past Medical History:  Diagnosis Date  . Aortic atherosclerosis ([place])   . BPH with urinary obstruction   . CAD (coronary artery disease)    a.  MI 1995, CABG x 3 2002 (patient says that he had LIMA and RIMA grafts). b. ETT-Cardiolite (10/15) with EF 48%, apical scar, no ischemia. c. Nuc 07/2017 abnormal -> cath was performed,  patent LIMA to LAD and SVG to OM 2. RIMA to RCA is atretic. The right coronary artery is occluded distally with extensive collaterals from the LAD, medical therapy.   . Cancer [facility])    bladder cancer  . Cervical spondylosis  without myelopathy 08-Feb-202017  . Chronic low back pain    sees Dr. Kary Kos   . GERD (gastroesophageal reflux disease)   . Hiatal hernia   . Hyperlipidemia   . Hypertension   . IBS (irritable bowel syndrome)   . Ischemic cardiomyopathy   . Myocardial infarction ([place])   . RBBB   . Restless legs syndrome   . Statin intolerance   . Syncope    a. in 2015 - no apparent cause, was taking sleep medicine at the time. Cardiac workup unremarkable.  . Tubular adenoma of colon     Family History  Problem Relation Age of Onset  . Heart attack Mother   . Aneurysm Father        femoral artery  . Heart disease Brother   . Heart disease Maternal Uncle        x 2  . Colon cancer Neg Hx   . Esophageal cancer Neg Hx   . Pancreatic cancer Neg Hx   . Kidney disease Neg Hx   . Liver disease Neg Hx     Social History   Socioeconomic History  . Marital status: Married    Spouse name: Not on file  . Number of children: 5  . Years of education: Some coll  . Highest education level: Not on file  Occupational History  . Occupation: retired  Scientific laboratory technician  . Financial resource strain: Not on  file  . Food insecurity:    Worry: Not on file    Inability: Not on file  . Transportation needs:    Medical: Not on file    Non-medical: Not on file  Tobacco Use  . Smoking status: Former Smoker    Types: Cigarettes    Last attempt to quit: 12/28/1978    Years since quitting: 40.0  . Smokeless tobacco: Never Used  Substance and Sexual Activity  . Alcohol use: Not Currently    Alcohol/week: 2.0 standard drinks    Types: 1 Glasses of wine, 1 Cans of beer per week    Comment: occassional  . Drug use: No  . Sexual activity: Not Currently  Lifestyle  . Physical activity:    Days per week: Not on file    Minutes per session: Not on file  . Stress: Not on file  Relationships  . Social connections:    Talks on phone: Not on file    Gets together: Not on file    Attends religious service: Not on  file    Active member of club or organization: Not on file    Attends meetings of clubs or organizations: Not on file    Relationship status: Not on file  Other Topics Concern  . Not on file  Social History Narrative   Lives at home w/ his wife   Right-handed   5 cups of coffee per day    Outpatient Medications Prior to Visit  Medication Sig Dispense Refill  . carvedilol (COREG) 3.125 MG tablet Take 3.125 mg by mouth 2 (two) times daily with a meal.    . DULoxetine (CYMBALTA) 20 MG capsule Take 20 mg by mouth every other day.    . famotidine (PEPCID) 20 MG tablet Take 1 tablet (20 mg total) by mouth 2 (two) times daily. 360 tablet 2  . feeding supplement (BOOST HIGH PROTEIN) LIQD Take 1 Container by mouth 2 (two) times daily between meals.    . furosemide (LASIX) 40 MG tablet Take 40 mg by mouth 2 (two) times daily.    . hydrOXYzine (ATARAX/VISTARIL) 25 MG tablet Take 1 tablet (25 mg total) by mouth every 4 (four) hours as needed for itching. 100 tablet 2  . lidocaine-prilocaine (EMLA) cream Apply 1 application topically as needed. 30 g 3  . mirabegron ER (MYRBETRIQ) 25 MG TB24 tablet Take 25 mg by mouth daily.    . nitroGLYCERIN (NITROSTAT) 0.4 MG SL tablet PLACE 1 TABLET (0.4 MG TOTAL) UNDER THE TONGUE EVERY 5 (FIVE) MINUTES AS NEEDED. 25 tablet 0  . oxyCODONE-acetaminophen (PERCOCET) 10-325 MG tablet Take 1 tablet by mouth every 4 (four) hours as needed for pain. 90 tablet 0  . PARoxetine (PAXIL) 10 MG tablet Take 1 tablet (10 mg total) by mouth daily. 30 tablet 11  . pramipexole (MIRAPEX) 1 MG tablet TAKE 1 AND 1/2 TABLETS BY MOUTH AT BEDTIME (Patient taking differently: Take 1.5 mg by mouth every evening. ) 135 tablet 3  . triamcinolone cream (KENALOG) 0.1 % Apply topically 3 (three) times daily. To affected areas 30 g 0  . oxyCODONE (OXYCONTIN) 10 mg 12 hr tablet Take 1 tablet (10 mg total) by mouth at bedtime.    . polyvinyl alcohol (LIQUIFILM TEARS) 1.4 % ophthalmic solution Place  1 drop into both eyes 2 (two) times daily as needed (dry eyes). 15 mL 0   No facility-administered medications prior to visit.      EXAM:  BP 102/62 (BP  Location: Left Arm, Patient Position: Sitting, Cuff Size: Normal)   Pulse 75   Temp 97.6 F (36.4 C)   Wt 182 lb 3.2 oz (82.6 kg)   SpO2 93%   BMI 27.70 kg/m   Body mass index is 27.7 kg/m.  GENERAL: vitals reviewed and listed above, alert,  But  Drowsy at times   ( hearing aids not in)  appears well hydrated and in no acute distress but chronically  ill nor obviously icteric  Non toxic  HEENT: atraumatic, conjunctiva  Clear, muddiness inferior  Vs yellow  no obvious abnormalities on inspection of external nose and ears OP : no lesion edema or exudate moist membranes  NECK: no obvious masses on inspection palpation  LUNGS: clear to auscultation bilaterally, no wheezes, rales or rhonchi, CV: HRRR, ocass irrge no clubbing cyanosis or  1+ edema to trac  nl cap refill  MS: moves all extremities without noticeable focal  Abnormality abd no hs megaly obvious  Or tenderness  Skin no petechia  senile ecchymosis right arm   Nl turgor  PSYCH: pleasant and cooperative, Lab Results  Component Value Date   WBC 6.9 11/26/2018   HGB 7.9 (L) 11/26/2018   HCT 25.9 (L) 11/26/2018   PLT 58 (L) 11/26/2018   GLUCOSE 95 11/26/2018   CHOL 122 09/21/2017   TRIG 79 09/21/2017   HDL 41 09/21/2017   LDLDIRECT 72 09/21/2017   LDLCALC 65 09/21/2017   ALT 25 11/25/2018   AST 17 11/25/2018   NA 137 11/26/2018   K 3.6 11/26/2018   CL 98 11/26/2018   CREATININE 2.03 (H) 11/26/2018   BUN 35 (H) 11/26/2018   CO2 28 11/26/2018   TSH 1.601 08/12/2018   PSA 7.50 (H) 12/01/2016   INR 1.18 11/25/2018   BP Readings from Last 3 Encounters:  12/20/18 102/62  12/01/18 106/60  11/29/18 108/60    ASSESSMENT AND PLAN:  Discussed the following assessment and plan:  Change of skin color - see  triage and hh notes  - Plan: Basic metabolic panel, CBC  with Differential/Platelet, Hepatic function panel, CANCELED: Basic metabolic panel, CANCELED: CBC with Differential/Platelet, CANCELED: Hepatic function panel  Pruritus - problematic uncertain if from med or disease states - Plan: Basic metabolic panel, CBC with Differential/Platelet, Hepatic function panel, CANCELED: Basic metabolic panel, CANCELED: CBC with Differential/Platelet, CANCELED: Hepatic function panel  Thrombocytopenia (HCC) - Plan: Basic metabolic panel, CBC with Differential/Platelet, Hepatic function panel, CANCELED: Basic metabolic panel, CANCELED: CBC with Differential/Platelet, CANCELED: Hepatic function panel  Anemia, unspecified type - Plan: Basic metabolic panel, CBC with Differential/Platelet, Hepatic function panel, CANCELED: Basic metabolic panel, CANCELED: CBC with Differential/Platelet, CANCELED: Hepatic function panel  Malignant neoplasm of urinary bladder, unspecified site ([place]) - Plan: Basic metabolic panel, CBC with Differential/Platelet, Hepatic function panel, CANCELED: Basic metabolic panel, CANCELED: CBC with Differential/Platelet, CANCELED: Hepatic function panel Has been battling pruritus for a number of months felt possibly from chemotherapy but has been off for a while. He could have faint icterus but is not obvious he certainly has paleness but appears to be hemodynamically stable during exam today and a history of tenuous fluid balance. No signs of acute infection Uncertain how much of his intermittent drowsiness if from meds metabolic or other  Plan screening labs repeat blood count liver BMP today and close follow-up by PCP Dr. Sarajane Jews at end of the week December 27 or as needed.  Will discuss with Dr. Sarajane Jews when he returns.  -Patient advised to return  or notify health care team  if  new concerns arise.  Patient Instructions  Getting  lab today and then plan follow up depending on  Results .   Will discuss . With dr Sarajane Jews to decide .   Standley Brooking. Khup Sapia M.D.

## 2018-12-20 NOTE — Telephone Encounter (Signed)
Left message on machine for patient to return our call CRM 

## 2018-12-20 NOTE — Patient Instructions (Signed)
Getting  lab today and then plan follow up depending on  Results .   Will discuss . With dr Sarajane Jews to decide .

## 2018-12-21 LAB — BASIC METABOLIC PANEL
BUN/Creatinine Ratio: 21 (calc) (ref 6–22)
BUN: 46 mg/dL — ABNORMAL HIGH (ref 7–25)
CALCIUM: 9.6 mg/dL (ref 8.6–10.3)
CO2: 26 mmol/L (ref 20–32)
Chloride: 98 mmol/L (ref 98–110)
Creat: 2.22 mg/dL — ABNORMAL HIGH (ref 0.70–1.11)
Glucose, Bld: 105 mg/dL — ABNORMAL HIGH (ref 65–99)
Potassium: 4 mmol/L (ref 3.5–5.3)
Sodium: 134 mmol/L — ABNORMAL LOW (ref 135–146)

## 2018-12-21 LAB — HEPATIC FUNCTION PANEL
AG Ratio: 1.3 (calc) (ref 1.0–2.5)
ALT: 12 U/L (ref 9–46)
AST: 18 U/L (ref 10–35)
Albumin: 3.8 g/dL (ref 3.6–5.1)
Alkaline phosphatase (APISO): 71 U/L (ref 40–115)
Bilirubin, Direct: 0.3 mg/dL — ABNORMAL HIGH (ref 0.0–0.2)
Globulin: 3 g/dL (calc) (ref 1.9–3.7)
Indirect Bilirubin: 0.6 mg/dL (calc) (ref 0.2–1.2)
Total Bilirubin: 0.9 mg/dL (ref 0.2–1.2)
Total Protein: 6.8 g/dL (ref 6.1–8.1)

## 2018-12-21 LAB — CBC WITH DIFFERENTIAL/PLATELET
Absolute Monocytes: 587 cells/uL (ref 200–950)
BASOS ABS: 53 {cells}/uL (ref 0–200)
Basophils Relative: 0.8 %
Eosinophils Absolute: 271 cells/uL (ref 15–500)
Eosinophils Relative: 4.1 %
HCT: 22.4 % — ABNORMAL LOW (ref 38.5–50.0)
Hemoglobin: 7.8 g/dL — ABNORMAL LOW (ref 13.2–17.1)
Lymphs Abs: 1597 cells/uL (ref 850–3900)
MCH: 33.3 pg — AB (ref 27.0–33.0)
MCHC: 34.8 g/dL (ref 32.0–36.0)
MCV: 95.7 fL (ref 80.0–100.0)
Monocytes Relative: 8.9 %
Neutro Abs: 4092 cells/uL (ref 1500–7800)
Neutrophils Relative %: 62 %
Platelets: 78 10*3/uL — ABNORMAL LOW (ref 140–400)
RBC: 2.34 10*6/uL — ABNORMAL LOW (ref 4.20–5.80)
RDW: 18.7 % — ABNORMAL HIGH (ref 11.0–15.0)
Total Lymphocyte: 24.2 %
WBC: 6.6 10*3/uL (ref 3.8–10.8)

## 2018-12-22 ENCOUNTER — Other Ambulatory Visit: Payer: Self-pay | Admitting: Medical

## 2018-12-22 ENCOUNTER — Telehealth: Payer: Self-pay | Admitting: Family Medicine

## 2018-12-22 ENCOUNTER — Other Ambulatory Visit: Payer: Self-pay | Admitting: Radiology

## 2018-12-22 MED ORDER — AMOXICILLIN-POT CLAVULANATE 875-125 MG PO TABS: 1.0000 | ORAL_TABLET | Freq: Two times a day (BID) | ORAL | 0 refills | Status: DC

## 2018-12-22 NOTE — Telephone Encounter (Signed)
Called and spoke with pts wife and she is aware of results from the labs.  She stated that the pt was getting worse and she knew the office was busy so she did call the cancer center and they called in augmentin for the pt.  She is aware to call if anything further is needed.

## 2018-12-22 NOTE — Telephone Encounter (Signed)
Copied from [place] [phone]. Topic: Quick Communication - See Telephone Encounter >> Dec 22, 2018  8:54 AM [name], NT wrote: CRM for notification. See Telephone encounter for: 12/22/18. Pts wife calling to see if lab results for pt came back. Also states pt is still running a temp of 102. Spoke with [name] and advised to schedule pt for an appt tomorrow, and that a nurse will call if labs are in to give results to pt. Wife states pt does not want to come back in for an appt. She is wanting to see if amoxicillin can be sent in to his pharmacy as this has helped in the past. Please advise.

## 2018-12-22 NOTE — Telephone Encounter (Signed)
I did review the lab results. They show normal liver enzymes, so nothing is seen to cause jaundice. He is anemic, but this has been stable. His kidney function is decreased, but this has been stable as well. I do not see an explanation for the fever, but I think a UTI is likely. Call in Amoxicillin 500 mg TID for 7 days. Recheck prn.

## 2018-12-22 NOTE — Telephone Encounter (Signed)
Dr. Sarajane Jews please advise.  I will call them with the lab results.  Thanks

## 2018-12-26 ENCOUNTER — Other Ambulatory Visit: Payer: Self-pay | Admitting: Urology

## 2018-12-26 ENCOUNTER — Encounter (HOSPITAL_COMMUNITY): Payer: Self-pay

## 2018-12-26 ENCOUNTER — Other Ambulatory Visit: Payer: Self-pay | Admitting: Physician Assistant

## 2018-12-26 ENCOUNTER — Ambulatory Visit (HOSPITAL_COMMUNITY)
Admit: 2018-12-26 | Discharge: 2018-12-26 | Disposition: A | Discharge status: Home / Self Care | Payer: Medicare HMO | Attending: Interventional Radiology | Admitting: Interventional Radiology

## 2018-12-26 ENCOUNTER — Ambulatory Visit (HOSPITAL_COMMUNITY)
Admission: RE | Admit: 2018-12-26 | Discharge: 2018-12-26 | Disposition: A | Discharge status: Home / Self Care | Payer: Medicare HMO | Source: Ambulatory Visit | Attending: Interventional Radiology | Admitting: Interventional Radiology

## 2018-12-26 DIAGNOSIS — T83022A Displacement of nephrostomy catheter, initial encounter: Secondary | ICD-10-CM | POA: Insufficient documentation

## 2018-12-26 DIAGNOSIS — N1330 Unspecified hydronephrosis: Secondary | ICD-10-CM

## 2018-12-26 DIAGNOSIS — Z888 Allergy status to other drugs, medicaments and biological substances status: Secondary | ICD-10-CM | POA: Insufficient documentation

## 2018-12-26 DIAGNOSIS — K589 Irritable bowel syndrome without diarrhea: Secondary | ICD-10-CM | POA: Diagnosis not present

## 2018-12-26 DIAGNOSIS — C662 Malignant neoplasm of left ureter: Secondary | ICD-10-CM

## 2018-12-26 DIAGNOSIS — I4510 Unspecified right bundle-branch block: Secondary | ICD-10-CM | POA: Diagnosis not present

## 2018-12-26 DIAGNOSIS — G2581 Restless legs syndrome: Secondary | ICD-10-CM | POA: Diagnosis not present

## 2018-12-26 DIAGNOSIS — E785 Hyperlipidemia, unspecified: Secondary | ICD-10-CM | POA: Diagnosis not present

## 2018-12-26 DIAGNOSIS — Z951 Presence of aortocoronary bypass graft: Secondary | ICD-10-CM | POA: Diagnosis not present

## 2018-12-26 DIAGNOSIS — Z955 Presence of coronary angioplasty implant and graft: Secondary | ICD-10-CM | POA: Diagnosis not present

## 2018-12-26 DIAGNOSIS — K219 Gastro-esophageal reflux disease without esophagitis: Secondary | ICD-10-CM | POA: Insufficient documentation

## 2018-12-26 DIAGNOSIS — C689 Malignant neoplasm of urinary organ, unspecified: Secondary | ICD-10-CM | POA: Diagnosis not present

## 2018-12-26 DIAGNOSIS — R339 Retention of urine, unspecified: Secondary | ICD-10-CM

## 2018-12-26 DIAGNOSIS — Y831 Surgical operation with implant of artificial internal device as the cause of abnormal reaction of the patient, or of later complication, without mention of misadventure at the time of the procedure: Secondary | ICD-10-CM | POA: Insufficient documentation

## 2018-12-26 DIAGNOSIS — I255 Ischemic cardiomyopathy: Secondary | ICD-10-CM | POA: Diagnosis not present

## 2018-12-26 DIAGNOSIS — Z79899 Other long term (current) drug therapy: Secondary | ICD-10-CM | POA: Insufficient documentation

## 2018-12-26 DIAGNOSIS — C679 Malignant neoplasm of bladder, unspecified: Secondary | ICD-10-CM | POA: Diagnosis not present

## 2018-12-26 DIAGNOSIS — I10 Essential (primary) hypertension: Secondary | ICD-10-CM | POA: Insufficient documentation

## 2018-12-26 HISTORY — PX: IR CATHETER TUBE CHANGE: IMG717

## 2018-12-26 HISTORY — PX: IR EXT NEPHROURETERAL CATH EXCHANGE: IMG5418

## 2018-12-26 LAB — BASIC METABOLIC PANEL
Anion gap: 12 (ref 5–15)
BUN: 54 mg/dL — ABNORMAL HIGH (ref 8–23)
CO2: 23 mmol/L (ref 22–32)
Calcium: 9.3 mg/dL (ref 8.9–10.3)
Chloride: 101 mmol/L (ref 98–111)
Creatinine, Ser: 2.15 mg/dL — ABNORMAL HIGH (ref 0.61–1.24)
GFR calc Af Amer: 32 mL/min — ABNORMAL LOW (ref >60)
GFR calc non Af Amer: 28 mL/min — ABNORMAL LOW (ref >60)
Glucose, Bld: 111 mg/dL — ABNORMAL HIGH (ref 70–99)
Potassium: 3.4 mmol/L — ABNORMAL LOW (ref 3.5–5.1)
Sodium: 136 mmol/L (ref 135–145)

## 2018-12-26 LAB — CBC WITH DIFFERENTIAL/PLATELET
Abs Immature Granulocytes: 0.09 10*3/uL — ABNORMAL HIGH (ref 0.00–0.07)
Basophils Absolute: 0 10*3/uL (ref 0.0–0.1)
Basophils Relative: 0 %
EOS ABS: 0.2 10*3/uL (ref 0.0–0.5)
Eosinophils Relative: 3 %
HCT: 22.6 % — ABNORMAL LOW (ref 39.0–52.0)
HEMOGLOBIN: 7.4 g/dL — AB (ref 13.0–17.0)
IMMATURE GRANULOCYTES: 2 %
Lymphocytes Relative: 22 %
Lymphs Abs: 1.3 10*3/uL (ref 0.7–4.0)
MCH: 32.6 pg (ref 26.0–34.0)
MCHC: 32.7 g/dL (ref 30.0–36.0)
MCV: 99.6 fL (ref 80.0–100.0)
MONOS PCT: 6 %
Monocytes Absolute: 0.4 10*3/uL (ref 0.1–1.0)
Neutro Abs: 4 10*3/uL (ref 1.7–7.7)
Neutrophils Relative %: 67 %
Platelets: 68 10*3/uL — ABNORMAL LOW (ref 150–400)
RBC: 2.27 MIL/uL — ABNORMAL LOW (ref 4.22–5.81)
RDW: 19.1 % — ABNORMAL HIGH (ref 11.5–15.5)
WBC: 5.9 10*3/uL (ref 4.0–10.5)
nRBC: 0 % (ref 0.0–0.2)

## 2018-12-26 LAB — PROTIME-INR
INR: 1.18
Prothrombin Time: 14.9 seconds (ref 11.4–15.2)

## 2018-12-26 MED ORDER — LIDOCAINE HCL 1 % IJ SOLN
INTRAMUSCULAR | Status: AC | PRN
  Administered 2018-12-26: 5 mL via INTRADERMAL
  Administered 2018-12-26: 10 mL via INTRADERMAL

## 2018-12-26 MED ORDER — FENTANYL CITRATE (PF) 100 MCG/2ML IJ SOLN
INTRAMUSCULAR | Status: AC
  Filled 2018-12-26: qty 6

## 2018-12-26 MED ORDER — MIDAZOLAM HCL 2 MG/2ML IJ SOLN
INTRAMUSCULAR | Status: AC | PRN
  Administered 2018-12-26: 1 mg via INTRAVENOUS
  Administered 2018-12-26: 2 mg via INTRAVENOUS
  Administered 2018-12-26: 1 mg via INTRAVENOUS

## 2018-12-26 MED ORDER — SODIUM CHLORIDE 0.9 % IV SOLN
INTRAVENOUS | Status: DC
  Administered 2018-12-26: 13:00:00 via INTRAVENOUS

## 2018-12-26 MED ORDER — HEPARIN SOD (PORK) LOCK FLUSH 100 UNIT/ML IV SOLN
500.0000 [IU] | INTRAVENOUS | Status: DC | PRN
  Filled 2018-12-26: qty 5

## 2018-12-26 MED ORDER — MIDAZOLAM HCL 2 MG/2ML IJ SOLN
INTRAMUSCULAR | Status: AC
  Filled 2018-12-26: qty 6

## 2018-12-26 MED ORDER — FENTANYL CITRATE (PF) 100 MCG/2ML IJ SOLN
INTRAMUSCULAR | Status: AC | PRN
  Administered 2018-12-26: 50 ug via INTRAVENOUS

## 2018-12-26 MED ORDER — IOPAMIDOL (ISOVUE-300) INJECTION 61%
INTRAVENOUS | Status: AC
  Administered 2018-12-26: 15 mL
  Filled 2018-12-26: qty 50

## 2018-12-26 MED ORDER — SODIUM CHLORIDE 0.9 % IV SOLN
1.0000 g | Freq: Once | INTRAVENOUS | Status: DC
  Filled 2018-12-26: qty 10

## 2018-12-26 MED ORDER — LIDOCAINE HCL 1 % IJ SOLN
INTRAMUSCULAR | Status: AC
  Filled 2018-12-26: qty 20

## 2018-12-26 NOTE — Discharge Instructions (Signed)
Suprapubic Catheter Home Guide A suprapubic catheter is a rubber tube used to drain urine from the bladder into a collection bag. The catheter is inserted into the bladder through a small opening in the in the lower abdomen, near the center of the body, above the pubic bone (suprapubic area). There is a tiny balloon filled with germ-free (sterile) water on the end of the catheter that is in the bladder. The balloon helps to keep the catheter in place. Your suprapubic catheter may need to be replaced every 4-6 weeks, or as often as recommended by your health care provider. The collection bag must be emptied every day and cleaned every 2-3 days. The collection bag can be put beside your bed at night and attached to your leg during the day. You may have a large collection bag to use at night and a smaller one to use during the day. What are the risks?  Urine flow can become blocked. This can happen if the catheter is not working correctly, or if you have a blood clot in your bladder or in the catheter.  Tissue near the catheter may can become irritated and bleed.  Bacteria may get into your bladder and cause a urinary tract infection. How do I change the catheter? Supplies needed  Two pairs of sterile gloves.  Catheter.  Two syringes.  Sterile water.  Sterile cleaning solution.  Lubricant.  Collection bags. Changing the catheter To replace your catheter, take the following steps: 1. Drink plenty of fluids during the hours before you plan to change the catheter. 2. Wash your hands with soap and water. If soap and water are not available, use hand sanitizer. 3. Lie on your back and put on sterile gloves. 4. Clean the skin around the catheter opening using the sterile cleaning solution. 5. Remove the water from the balloon using a syringe. 6. Slowly remove the catheter. ? Do not pull on the catheter if it seems stuck. ? Call your health care provider immediately if you have difficulty  removing the catheter. 7. Take off the used gloves, and put on a new pair. 8. Put lubricant on the end of the new catheter that will go into your bladder. 9. Gently slide the catheter through the opening in your abdomen and into your bladder. 10. Wait for some urine to start flowing through the catheter. When urine starts to flow through the catheter, use a new syringe to fill the balloon with sterile water. 11. Attach the collection bag to the end of the catheter. Make sure the connection is tight. 12. Remove the gloves and wash your hands with soap and water. How do I care for my skin around the catheter? Use a clean washcloth and soapy water to clean the skin around your catheter every day. Pat the area dry with a clean towel.  Do not pull on the catheter.  Do not use ointment or lotion on this area unless told by your health care provider.  Check your skin around the catheter every day for signs of infection. Check for: ? Redness, swelling, or pain. ? Fluid or blood. ? Warmth. ? Pus or a bad smell. How do I clean and empty the collection bag? Clean the collection bag every 2-3 days, or as often as told by your health care provider. To do this, take the following steps:  Wash your hands with soap and water. If soap and water are not available, use hand sanitizer.  Disconnect the bag from the  catheter and immediately attach a new bag to the catheter.  Empty the used bag completely.  Clean the used bag using one of the following methods: ? Rinse the used bag with warm water and soap. ? Fill the bag with water and add 1 tsp of vinegar. Let it sit for about 30 minutes, then empty the bag.  Let the bag dry completely, and put it in a clean plastic bag before storing it. Empty the large collection bag every 8 hours. Empty the small collection bag when it is about ? full. To empty your large or small collection bag, take the following steps:  Always keep the bag below the level of the  catheter. This keeps urine from flowing backwards into the catheter.  Hold the bag over the toilet or another container. Turn the valve (spigot) at the bottom of the bag to empty the urine. ? Do not touch the opening of the spigot. ? Do not let the opening touch the toilet or container.  Close the spigot tightly when the bag is empty. What are some general tips?   Always wash your hands before and after caring for your catheter and collection bag. Use a mild, fragrance-free soap. If soap and water are not available, use hand sanitizer.  Clean the catheter with soap and water as often as told by your health care provider.  Always make sure there are no twists or curls (kinks) in the catheter tube.  Always make sure there are no leaks in the catheter or collection bag.  Drink enough fluid to keep your urine clear or pale yellow.  Do not take baths, swim, or use a hot tub. When should I seek medical care? Seek medical care if:  You leak urine.  You have redness, swelling, or pain around your catheter opening.  You have fluid or blood coming from your catheter opening.  Your catheter opening feels warm to the touch.  You have pus or a bad smell coming from your catheter opening.  You have a fever or chills.  Your urine flow slows down.  Your urine becomes cloudy or smelly. When should I seek immediate medical care? Seek immediate medical care if your catheter comes out, or if you have:  Nausea.  Back pain.  Difficulty changing your catheter.  Blood in your urine.  No urine flow for 1 hour. This information is not intended to replace advice given to you by your health care provider. Make sure you discuss any questions you have with your health care provider. Document Released: 09/01/2011 Document Revised: 08/12/2016 Document Reviewed: 08/27/2015 Elsevier Interactive Patient Education  2019 [employer]    Percutaneous Nephrostomy  Percutaneous nephrostomy is a  procedure to insert a flexible tube into your kidney so that urine can leave your body. This procedure may be done if a medical condition prevents urine from leaving your kidney in the usual way. Urine is normally carried from the kidneys to the bladder through narrow tubes called ureters. A ureter can become blocked because of conditions such as kidney stones, tumors, infection, or blood clots. The nephrostomy tube will be inserted through your back. After the procedure, the tube will remain in place, and urine will drain from the kidney into a drainage bag outside your body. Draining the urine will relieve pressure and help prevent infection that could damage the kidney. Often, this procedure allows your health care provider to identify the cause of the blockage and plan appropriate treatment. Tell a  health care provider about:  Any allergies you have.  All medicines you are taking, including vitamins, herbs, eye drops, creams, and over-the-counter medicines.  Any problems you or family members have had with anesthetic medicines.  Any blood disorders you have.  Any surgeries you have had.  Any medical conditions you have.  Whether you are pregnant or may be pregnant. What are the risks? Generally, this is a safe procedure. However, problems may occur, including:  Infection.  Bleeding.  Allergic reactions to medicines or dyes used in the procedure.  Damage to other structures or organs. What happens before the procedure?  Follow instructions from your health care provider about eating or drinking restrictions.  Ask your health care provider about: ? Changing or stopping your regular medicines. This is especially important if you are taking diabetes medicines or blood thinners. ? Taking medicines such as aspirin and ibuprofen. These medicines can thin your blood. Do not take these medicines before your procedure if your health care provider instructs you not to.  You may be given  antibiotic medicine to help prevent infection.  You may have blood tests to see how well your kidneys and liver are working and to see how well your blood can clot.  Plan to have someone take you home from the hospital or clinic. What happens during the procedure?  To reduce your risk of infection: ? Your health care team will wash or sanitize their hands. ? Your skin will be washed with soap.  An IV tube will be inserted into one of your veins. You may be given medicines through this IV tube to help prevent nausea and pain.  You will be positioned on your abdomen.  You will be given one or more of the following: ? A medicine to help you relax (sedative). ? A medicine to numb the area (local anesthetic) where the nephrostomy tube will be inserted.  The nephrostomy tube, which is thin and flexible, will be inserted into a needle.  The needle will be inserted into your body and guided to your kidney. An imaging method that uses X-ray images (fluoroscopy) will be used to help guide the needle to the kidney.  A dye will be injected through the nephrostomy tube. Then, X-ray images that highlight your kidney will be taken.  Next, the needle will be removed, but the nephrostomy tube will be left in your kidney. The tube may be secured to your skin with stitches (sutures).  A drainage bag will be attached to the nephrostomy tube. Urine will be able to drain from your kidney to this drainage bag outside your body. The procedure may vary among health care providers and hospitals. What happens after the procedure?  Your blood pressure, heart rate, breathing rate, and blood oxygen level will be monitored until the medicines you were given have worn off.  You will need to remain lying down for several hours.  You will be taught how to care for the nephrostomy tube and the drainage bag.  Donot drive for 24 hours if you were given a sedative. This information is not intended to replace advice  given to you by your health care provider. Make sure you discuss any questions you have with your health care provider. Document Released: 10/04/2013 Document Revised: 09/25/2016 Document Reviewed: 09/25/2016 Elsevier Interactive Patient Education  2019 [employer].    Moderate Conscious Sedation, Adult, Care After These instructions provide you with information about caring for yourself after your procedure. Your health care  provider may also give you more specific instructions. Your treatment has been planned according to current medical practices, but problems sometimes occur. Call your health care provider if you have any problems or questions after your procedure. What can I expect after the procedure? After your procedure, it is common:  To feel sleepy for several hours.  To feel clumsy and have poor balance for several hours.  To have poor judgment for several hours.  To vomit if you eat too soon. Follow these instructions at home: For at least 24 hours after the procedure:   Do not: ? Participate in activities where you could fall or become injured. ? Drive. ? Use heavy machinery. ? Drink alcohol. ? Take sleeping pills or medicines that cause drowsiness. ? Make important decisions or sign legal documents. ? Take care of children on your own.  Rest. Eating and drinking  Follow the diet recommended by your health care provider.  If you vomit: ? Drink water, juice, or soup when you can drink without vomiting. ? Make sure you have little or no nausea before eating solid foods. General instructions  Have a responsible adult stay with you until you are awake and alert.  Take over-the-counter and prescription medicines only as told by your health care provider.  If you smoke, do not smoke without supervision.  Keep all follow-up visits as told by your health care provider. This is important. Contact a health care provider if:  You keep feeling nauseous or you keep  vomiting.  You feel light-headed.  You develop a rash.  You have a fever. Get help right away if:  You have trouble breathing. This information is not intended to replace advice given to you by your health care provider. Make sure you discuss any questions you have with your health care provider. Document Released: 10/04/2013 Document Revised: 05/18/2016 Document Reviewed: 04/04/2016 Elsevier Interactive Patient Education  2019 [employer].

## 2018-12-26 NOTE — Sedation Documentation (Signed)
Suprapubic catheter exchange begun.

## 2018-12-26 NOTE — Procedures (Signed)
Pre Procedure Dx: Hydronephrosis Post Procedure Dx: Same  Successful suprapubic catheter and bilateral NUS exchange.    EBL: None   No immediate complications.   [name], MD Pager #: [phone]

## 2018-12-26 NOTE — Progress Notes (Signed)
Referring Physician(s): Keene Breath  Supervising Physician: Sandi Mariscal  Patient Status:  [facility]  Chief Complaint: Bladder cancer, bilateral hydronephrosis   Subjective: Patient familiar to IR service from recent nephroureteral catheter exchanges and suprapubic catheter placement.  He has a history of bladder cancer with bilateral hydronephrosis necessitating nephrostomy catheters and ultimately placement of a suprapubic catheter.  On 09/20/2018 he underwent right sided nephroureteral catheter exchange as well as conversion of left nephrostomy to left nephroureteral catheter.  On 11/08/2018 he underwent exchange of bilateral percutaneous nephroureteral catheters as well as suprapubic catheter with upsizing to 16 [place].  He is currently on amoxicillin for UTI.  He has had some discomfort from the sutures at both nephroureteral catheter sites as well as suprapubic cath site.  He presents again today for for nephroureteral and suprapubic catheter exchanges.  He denies fever, headache, chest pain, dyspnea, cough, nausea, vomiting or bleeding.  Past Medical History:  Diagnosis Date  . Aortic atherosclerosis ([place])   . BPH with urinary obstruction   . CAD (coronary artery disease)    a.  MI 1995, CABG x 3 2002 (patient says that he had LIMA and RIMA grafts). b. ETT-Cardiolite (10/15) with EF 48%, apical scar, no ischemia. c. Nuc 07/2017 abnormal -> cath was performed,  patent LIMA to LAD and SVG to OM 2. RIMA to RCA is atretic. The right coronary artery is occluded distally with extensive collaterals from the LAD, medical therapy.   . Cancer [facility])    bladder cancer  . Cervical spondylosis without myelopathy 09-18-2016  . Chronic low back pain    sees Dr. Kary Kos   . GERD (gastroesophageal reflux disease)   . Hiatal hernia   . Hyperlipidemia   . Hypertension   . IBS (irritable bowel syndrome)   . Ischemic cardiomyopathy   . Myocardial infarction ([place])   . RBBB   . Restless legs syndrome     . Statin intolerance   . Syncope    a. in 2015 - no apparent cause, was taking sleep medicine at the time. Cardiac workup unremarkable.  . Tubular adenoma of colon    Past Surgical History:  Procedure Laterality Date  . COLONOSCOPY  10/17/2014   per Dr. Hilarie Fredrickson, tubular adenomas, repeat in 3 yrs   . COLONOSCOPY WITH PROPOFOL N/A 12/01/2017   Procedure: COLONOSCOPY WITH PROPOFOL;  Surgeon: Milus Banister, MD;  Location: [facility];  Service: Endoscopy;  Laterality: N/A;  . CYSTOSCOPY W/ RETROGRADES Left 11/25/2017   Procedure: CYSTOSCOPY WITH RETROGRADE PYELOGRAM;  Surgeon: Irine Seal, MD;  Location: [facility]S;  Service: Urology;  Laterality: Left;  . HEART BYPASS    . IR CATHETER TUBE CHANGE  11/08/2018  . IR CONVERT LEFT NEPHROSTOMY TO NEPHROURETERAL CATH  09/20/2018  . IR CONVERT RIGHT NEPHROSTOMY TO NEPHROURETERAL CATH  02/04/2018  . IR CONVERT RIGHT NEPHROSTOMY TO NEPHROURETERAL CATH  08/23/2018  . IR EXT NEPHROURETERAL CATH EXCHANGE  04/11/2018  . IR EXT NEPHROURETERAL CATH EXCHANGE  07/04/2018  . IR EXT NEPHROURETERAL CATH EXCHANGE  09/20/2018  . IR EXT NEPHROURETERAL CATH EXCHANGE  11/08/2018  . IR EXT NEPHROURETERAL CATH EXCHANGE  11/08/2018  . IR FLUORO GUIDE CV LINE RIGHT  12/27/2017  . IR FLUORO GUIDED NEEDLE PLC ASPIRATION/INJECTION LOC  08/19/2018  . IR NEPHROSTOGRAM LEFT THRU EXISTING ACCESS  12/24/2017  . IR NEPHROSTOMY EXCHANGE LEFT  01/28/2018  . IR NEPHROSTOMY EXCHANGE LEFT  02/04/2018  . IR NEPHROSTOMY EXCHANGE LEFT  02/25/2018  . IR  NEPHROSTOMY EXCHANGE LEFT  07/04/2018  . IR NEPHROSTOMY EXCHANGE LEFT  07/05/2018  . IR NEPHROSTOMY EXCHANGE LEFT  08/09/2018  . IR NEPHROSTOMY EXCHANGE LEFT  08/16/2018  . IR NEPHROSTOMY EXCHANGE LEFT  08/23/2018  . IR NEPHROSTOMY EXCHANGE RIGHT  12/24/2017  . IR NEPHROSTOMY EXCHANGE RIGHT  01/28/2018  . IR NEPHROSTOMY EXCHANGE RIGHT  04/11/2018  . IR NEPHROSTOMY EXCHANGE RIGHT  07/05/2018  . IR NEPHROSTOMY EXCHANGE RIGHT  08/09/2018  . IR NEPHROSTOMY  EXCHANGE RIGHT  08/19/2018  . IR NEPHROSTOMY PLACEMENT LEFT  11/28/2017  . IR NEPHROSTOMY PLACEMENT RIGHT  12/03/2017  . IR PATIENT EVAL TECH 0-60 MINS  03/24/2018  . IR PATIENT EVAL TECH 0-60 MINS  03/28/2018  . IR PATIENT EVAL TECH 0-60 MINS  04/25/2018  . IR PATIENT EVAL TECH 0-60 MINS  06/20/2018  . IR US GUIDE VASC ACCESS RIGHT  12/27/2017  . LAPAROSCOPY N/A 12/01/2017   Procedure: LAPAROSCOPIC DIVERTING OSTOMY;  Surgeon: Stark Klein, MD;  Location[facility];  Service: General;  Laterality: N/A;  . LEFT HEART CATH AND CORS/GRAFTS ANGIOGRAPHY N/A 08/25/2017   Procedure: LEFT HEART CATH AND CORS/GRAFTS ANGIOGRAPHY;  Surgeon: Wellington Hampshire, MD;  Location: [facility];  Service: Cardiovascular;  Laterality: N/A;  . LUMBAR FUSION  2003   L3-L4  . PROSTATE SURGERY  06-27-12   per Dr. Roni Bread, had CTT  . TONSILLECTOMY    . TRANSURETHRAL RESECTION OF BLADDER TUMOR N/A 11/25/2017   Procedure: TRANSURETHRAL RESECTION OF BLADDER TUMOR (TURBT);  Surgeon: Irine Seal, MD;  Location[facility];  Service: Urology;  Laterality: N/A;      Allergies: Statins and Cyproheptadine  Medications: Prior to Admission medications   Medication Sig Start Date End Date Taking? Authorizing Provider  amoxicillin-clavulanate (AUGMENTIN) 875-125 MG tablet Take 1 tablet by mouth 2 (two) times daily. 12/22/18  Yes Tanner, Lyndon Code., PA-C  carvedilol (COREG) 3.125 MG tablet Take 3.125 mg by mouth 2 (two) times daily with a meal.   Yes [provider]  DULoxetine (CYMBALTA) 20 MG capsule Take 20 mg by mouth every other day.   Yes [provider]  famotidine (PEPCID) 20 MG tablet Take 1 tablet (20 mg total) by mouth 2 (two) times daily. 10/18/18  Yes Wyatt Portela, MD  furosemide (LASIX) 40 MG tablet Take 40 mg by mouth 2 (two) times daily.   Yes [provider]  mirabegron ER (MYRBETRIQ) 25 MG TB24 tablet Take 25 mg by mouth daily.   Yes [provider]  oxyCODONE-acetaminophen  (PERCOCET) 10-325 MG tablet Take 1 tablet by mouth every 4 (four) hours as needed for pain. 11/15/18  Yes Wyatt Portela, MD  PARoxetine (PAXIL) 10 MG tablet Take 1 tablet (10 mg total) by mouth daily. 11/29/18  Yes Cameron Sprang, MD  feeding supplement (BOOST HIGH PROTEIN) LIQD Take 1 Container by mouth 2 (two) times daily between meals.    [provider]  hydrOXYzine (ATARAX/VISTARIL) 25 MG tablet Take 1 tablet (25 mg total) by mouth every 4 (four) hours as needed for itching. 11/17/18   [name], MD  lidocaine-prilocaine (EMLA) cream Apply 1 application topically as needed. 10/18/18   Wyatt Portela, MD  nitroGLYCERIN (NITROSTAT) 0.4 MG SL tablet PLACE 1 TABLET (0.4 MG TOTAL) UNDER THE TONGUE EVERY 5 (FIVE) MINUTES AS NEEDED. 09/27/18 12/26/18  Dunn, Nedra Hai, PA-C  pramipexole (MIRAPEX) 1 MG tablet TAKE 1 AND 1/2 TABLETS BY MOUTH AT BEDTIME Patient taking differently: Take  1.5 mg by mouth every evening.  06/29/18   [name], MD  triamcinolone cream (KENALOG) 0.1 % Apply topically 3 (three) times daily. To affected areas 07/04/18   Donne Hazel, MD     Vital Signs: BP 113/69   Pulse 78   Temp (!) 97.4 F (36.3 C) (Oral)   Resp 18   Ht 5\' 8"  (1.727 m)   Wt 177 lb 3.2 oz (80.4 kg)   SpO2 100%   BMI 26.94 kg/m   Physical Exam awake, slightly drowsy.  Chest clear to auscultation bilaterally.  Clean, intact right chest wall Port-A-Cath.  Heart with regular rate and rhythm.  Abdomen soft, colostomy and nephroureteral/suprapubic catheters intact.  Trace pretibial edema bilaterally.  Imaging: No results found.  Labs:  CBC: Recent Labs    11/25/18 1335 11/25/18 1634 11/26/18 1021 12/20/18 1211  WBC 7.7 7.4 6.9 6.6  HGB 7.6* 7.5* 7.9* 7.8*  HCT 23.5* 23.6* 25.9* 22.4*  PLT 54* 49* 58* 78*    COAGS: Recent Labs    12/27/17 1227  08/19/18 0813 08/22/18 1730 09/20/18 1211 11/25/18 1604 12/26/18 1217  INR 1.13   < > 1.23 1.15 1.05 1.18 1.18  APTT 33   --  36 37* 34  --   --    < > = values in this interval not displayed.    BMP: Recent Labs    11/25/18 1335 11/25/18 1604 11/26/18 0700 12/20/18 1211 12/26/18 1217  NA 136 135 137 134* 136  K 4.3 4.0 3.6 4.0 3.4*  CL 101 100 98 98 101  CO2 26 25 28 26 23   GLUCOSE 100* 99 95 105* 111*  BUN 31* 34* 35* 46* 54*  CALCIUM 9.8 9.4 9.6 9.6 9.3  CREATININE 2.02* 2.00* 2.03* 2.22* 2.15*  GFRNONAA 30* 30* 30*  --  28*  GFRAA 35* 35* 35*  --  32*    LIVER FUNCTION TESTS: Recent Labs    09/23/18 0913 10/10/18 1113 11/06/18 1958 11/25/18 1335 12/20/18 1211  BILITOT 0.8 0.9 1.3* 1.1 0.9  AST 17 16 17 17 18   ALT 19 13 13 25 12   ALKPHOS 61 57 58 81  --   PROT 6.9 7.2 6.9 7.3 6.8  ALBUMIN 3.2* 3.4* 3.4* 3.5  --     Assessment and Plan: Pt with history of bladder cancer with bilateral hydronephrosis necessitating nephrostomy catheters and ultimately placement of a suprapubic catheter. On 09/20/2018 he underwent right sided nephroureteral catheter exchange as well as conversion of left nephrostomy to left nephroureteral catheter.  On 11/08/2018 he underwent exchange of bilateral percutaneous nephroureteral catheters as well as suprapubic catheter with upsizing to 16 [place].  He is currently on amoxicillin for UTI.  He has had some discomfort from the sutures at both nephroureteral catheter sites as well as suprapubic cath site.  He presents again today for for nephroureteral and suprapubic catheter exchanges.  Details/risks of procedures, including but not limited to, internal bleeding, infection, injury to adjacent structures discussed with patient and wife with their understanding and consent.   Electronically Signed: D. Rowe Robert, PA-C 12/26/2018, 1:10 PM   I spent a total of 20 minutes at the the patient's bedside AND on the patient's hospital floor or unit, greater than 50% of which was counseling/coordinating care for bilateral nephroureteral and suprapubic catheter  exchanges    Patient ID: Charles Coffey, male   DOB: 25-Sep-1935, 83 y.o.   MRN: [id number]

## 2018-12-26 NOTE — Sedation Documentation (Signed)
Nephrostomy tube exchanges complete.

## 2018-12-27 ENCOUNTER — Telehealth: Payer: Self-pay

## 2018-12-27 NOTE — Telephone Encounter (Signed)
Phone call placed to patient's wife to schedule visit with Palliative Care. Visit scheduled for 12/29/18

## 2018-12-29 ENCOUNTER — Other Ambulatory Visit: Payer: Medicare HMO | Admitting: Internal Medicine

## 2018-12-29 DIAGNOSIS — Z515 Encounter for palliative care: Secondary | ICD-10-CM | POA: Diagnosis not present

## 2018-12-29 NOTE — Progress Notes (Signed)
[facility] Telephone: [phone] Fax: [phone]  PATIENT NAME: Charles Coffey DOB: 03/15/1935 MRN: [id number]  PRIMARY CARE PROVIDER:   [name], MD  REFERRING PROVIDER:  [name], MD [place], [place] [address]  RESPONSIBLE PARTYWindy Carina Coffey(wife) [phone] and self       RECOMMENDATIONS and PLAN:  Palliative Care Encounter  Z51.5  1.  Weakness:  Chronic and multifactorial as related to CHF, anemia and metastatic bladder cancer.  Baseline. Balanced meals, improve hydration.  2.  Memory loss with behaviors:  FAST stage 6d.  Increase use of Zyprexa 2.5 mg nightly instead of prn.  Would consider initiation of Depakote if no improvement from use of Zyprexa.   Decrease daytime napping.    3.  Edema:  Related to CKD and CHF.  Low sodium meals, restart compression socks daily and leg elevation.  CHF management instructions provided.  No NSAIDs.    4. Advanced care planning:  Reviewed Palliative vs Hospice care.  Patient goals are to attempt to regain strength and to remain at home. Potential plans for transition to Hospice care in the future if additional decline occurs. Pt declines idea of potential dialysis if recommended in the future. MOST form reviewed with selections of DNAR/DNI, intermediate interventions, IV therapy trial and antibiotics if determined necessary, No tube feeding.  Documents left in the home.  Palliative care will continue to follow pt.  I spent 90 minutes providing this consultation,  from 3:00pm to 4 :30pm at home. More than 50% of the time in this consultation was spent coordinating communication with patient and wife.   HISTORY OF PRESENT ILLNESS:  Charles Coffey is a 84 y.o. year old male with multiple medical problems including inclusive of bladder cancer, tubular adenoma of the colon, CHF(EF 25-30% in Aug. 2019), CAD with previous MI, CKD stage 3. He was admitted to the hospital from 11/29-11/30/19  due to CHF exacerbation and 11/10-11/14/19 due to sepsis.  He struggles with recurrent UTIs. Pt. Has a suprapubic catheter, bilat nephrostomy tubes(capped and exchanged frequently) and a colostomy.  Pt is able to care for his own appliances.  He is no longer receiving treatment of cancer and Oncology notes denote "incurable disease".  Supportive care only from Oncology.   The Palliative Care team was asked to help address goals of care and and advanced care planning.Marland Kitchen   CODE STATUS: DNAR/DNI  PPS: 50% HOSPICE ELIGIBILITY/DIAGNOSIS: TBD  PAST MEDICAL HISTORY:  Past Medical History:  Diagnosis Date  . Aortic atherosclerosis ([place])   . BPH with urinary obstruction   . CAD (coronary artery disease)    a.  MI 1995, CABG x 3 2002 (patient says that he had LIMA and RIMA grafts). b. ETT-Cardiolite (10/15) with EF 48%, apical scar, no ischemia. c. Nuc 07/2017 abnormal -> cath was performed,  patent LIMA to LAD and SVG to OM 2. RIMA to RCA is atretic. The right coronary artery is occluded distally with extensive collaterals from the LAD, medical therapy.   . Cancer [facility])    bladder cancer  . Cervical spondylosis without myelopathy 2020-02-616  . Chronic low back pain    sees Dr. Kary Kos   . GERD (gastroesophageal reflux disease)   . Hiatal hernia   . Hyperlipidemia   . Hypertension   . IBS (irritable bowel syndrome)   . Ischemic cardiomyopathy   . Myocardial infarction ([place])   . RBBB   . Restless legs syndrome   .  Statin intolerance   . Syncope    a. in 2015 - no apparent cause, was taking sleep medicine at the time. Cardiac workup unremarkable.  . Tubular adenoma of colon     PHYSICAL EXAM:   General: Chronically ill and fatigued appearing elderly male.  Cardiovascular: regular rate and rhythm Pulmonary: clear ant fields Abdomen: soft, tender ostomy stoma with protrusion of abdomen beneath stoma. Patent of soft brown stool.   GU: Bilat nephrostomy tubes noted(capped).   Suprapubic  catheter noted Extremities: 2+ edema BLE Skin: Exposed skin is intact Neurological: Oriented x 2, nods easily while sitting. Psych:  Calm and cooperative.  Appropriate conversation  Gonzella Lex, NP

## 2018-12-30 NOTE — Telephone Encounter (Signed)
Spoke with wife and patient has been on amoxacillin for about a week.  Palliative care has been coming to the home to take care of the patient.  The wife has some concerns.  Appointment made for 01/03/19.  FYI

## 2019-01-03 ENCOUNTER — Encounter: Payer: Self-pay | Admitting: Family Medicine

## 2019-01-03 ENCOUNTER — Ambulatory Visit (INDEPENDENT_AMBULATORY_CARE_PROVIDER_SITE_OTHER): Payer: Medicare HMO | Admitting: Family Medicine

## 2019-01-03 VITALS — BP 116/78 | HR 52 | Temp 97.5°F | Wt 183.3 lb

## 2019-01-03 DIAGNOSIS — R69 Illness, unspecified: Secondary | ICD-10-CM | POA: Diagnosis not present

## 2019-01-03 DIAGNOSIS — F331 Major depressive disorder, recurrent, moderate: Secondary | ICD-10-CM

## 2019-01-03 DIAGNOSIS — C679 Malignant neoplasm of bladder, unspecified: Secondary | ICD-10-CM | POA: Diagnosis not present

## 2019-01-03 DIAGNOSIS — I10 Essential (primary) hypertension: Secondary | ICD-10-CM | POA: Diagnosis not present

## 2019-01-03 DIAGNOSIS — I5022 Chronic systolic (congestive) heart failure: Secondary | ICD-10-CM | POA: Diagnosis not present

## 2019-01-03 DIAGNOSIS — N1330 Unspecified hydronephrosis: Secondary | ICD-10-CM

## 2019-01-03 DIAGNOSIS — D649 Anemia, unspecified: Secondary | ICD-10-CM | POA: Diagnosis not present

## 2019-01-03 NOTE — Progress Notes (Signed)
   Subjective:    Patient ID: Charles Coffey, male    DOB: 1935-11-18, 84 y.o.   MRN: [id number]  HPI Here to follow up a hospital stay from 11-25-18 to 11-26-18 for an acute exacerbation of CHF. He presented with SOB and leg swelling. He was diuresed quickly and he felt better. He was sent home on Lasix 40 mg bid with instructions to increase this to 80 mg bid if his weight goes up by 3 lbs or more in 24 hours. He thus far has not needed to do this. He saw Dr. Saunders Revel for cardiology follow and he agreed with this regimen. Today he and his wife have a few questions. He is tired of wearing the urostomy tubes and the drainage bag, and he asks if these can be removed. He also describes feeling tired all the time and if his anemia can be improved. This has been stable for several months and the Hgb hangs around the mid 7 range. They ask about Procrit injections, etc. His wife thinks he could benefit from exercise and she has learned of a "chair yoga" program that he could enroll in. He last saw Dr. Roni Bread on 12-07-18. He has been part of the Palliative Care program, and he and his wife are very pleased with the services they have been able to offer.    Review of Systems  Constitutional: Positive for fatigue.  Respiratory: Negative.   Cardiovascular: Positive for leg swelling. Negative for chest pain and palpitations.  Gastrointestinal: Negative.   Neurological: Negative.        Objective:   Physical Exam Constitutional:      Comments: Frail, in a wheelchair   Cardiovascular:     Rate and Rhythm: Normal rate and regular rhythm.     Pulses: Normal pulses.     Heart sounds: Normal heart sounds.  Pulmonary:     Effort: Pulmonary effort is normal.     Breath sounds: Normal breath sounds.  Musculoskeletal:     Comments: 1+ ankle edema bilaterally   Neurological:     General: No focal deficit present.     Mental Status: He is oriented to person, place, and time.           Assessment & Plan:    He is doing reasonably well considering his diagnosis of urothelial cancer. I told him that almost certainly he would develop another urinary obstruction if the urostomy tubes are removed, and he realizes that these need to be kept in place. As for treatment of the anemia, I suggested he ask his oncologist about the possibilities. As for chair yoga, I think this would be an excellent idea for him to feel better emotionally and physically. His CHF is stable for now. Alysia Penna, MD

## 2019-01-05 ENCOUNTER — Encounter: Payer: Self-pay | Admitting: General Practice

## 2019-01-05 NOTE — Progress Notes (Signed)
[facility] Note  Received and returned call from pt's wife [name] requesting [facility] appt for Charles Coffey per his request. Scheduled for 2pm tomorrow, Friday 1/10.   [place], [place], [facility] Pager [phone] Voicemail [phone]

## 2019-01-06 ENCOUNTER — Encounter: Payer: Self-pay | Admitting: General Practice

## 2019-01-06 ENCOUNTER — Inpatient Hospital Stay: Payer: Medicare HMO

## 2019-01-06 ENCOUNTER — Telehealth: Payer: Self-pay

## 2019-01-06 ENCOUNTER — Inpatient Hospital Stay: Payer: Medicare HMO | Attending: Oncology | Admitting: Medical

## 2019-01-06 VITALS — BP 104/68 | HR 91 | Temp 97.6°F | Resp 18 | Ht 68.0 in | Wt 183.9 lb

## 2019-01-06 DIAGNOSIS — R5383 Other fatigue: Secondary | ICD-10-CM | POA: Diagnosis not present

## 2019-01-06 DIAGNOSIS — C786 Secondary malignant neoplasm of retroperitoneum and peritoneum: Secondary | ICD-10-CM

## 2019-01-06 DIAGNOSIS — C679 Malignant neoplasm of bladder, unspecified: Secondary | ICD-10-CM | POA: Diagnosis not present

## 2019-01-06 DIAGNOSIS — R410 Disorientation, unspecified: Secondary | ICD-10-CM | POA: Diagnosis not present

## 2019-01-06 DIAGNOSIS — I509 Heart failure, unspecified: Secondary | ICD-10-CM | POA: Diagnosis not present

## 2019-01-06 DIAGNOSIS — Z95828 Presence of other vascular implants and grafts: Secondary | ICD-10-CM

## 2019-01-06 DIAGNOSIS — D631 Anemia in chronic kidney disease: Secondary | ICD-10-CM

## 2019-01-06 DIAGNOSIS — R079 Chest pain, unspecified: Secondary | ICD-10-CM

## 2019-01-06 DIAGNOSIS — N189 Chronic kidney disease, unspecified: Secondary | ICD-10-CM

## 2019-01-06 DIAGNOSIS — R627 Adult failure to thrive: Secondary | ICD-10-CM

## 2019-01-06 DIAGNOSIS — Z936 Other artificial openings of urinary tract status: Secondary | ICD-10-CM | POA: Diagnosis not present

## 2019-01-06 LAB — CMP (CANCER CENTER ONLY)
ALT: 14 U/L (ref 0–44)
AST: 20 U/L (ref 15–41)
Albumin: 3.7 g/dL (ref 3.5–5.0)
Alkaline Phosphatase: 83 U/L (ref 38–126)
Anion gap: 11 (ref 5–15)
BUN: 43 mg/dL — AB (ref 8–23)
CO2: 25 mmol/L (ref 22–32)
Calcium: 9.7 mg/dL (ref 8.9–10.3)
Chloride: 100 mmol/L (ref 98–111)
Creatinine: 2.57 mg/dL — ABNORMAL HIGH (ref 0.61–1.24)
GFR, Est AFR Am: 26 mL/min — ABNORMAL LOW (ref >60)
GFR, Estimated: 22 mL/min — ABNORMAL LOW (ref >60)
Glucose, Bld: 129 mg/dL — ABNORMAL HIGH (ref 70–99)
POTASSIUM: 4.3 mmol/L (ref 3.5–5.1)
Sodium: 136 mmol/L (ref 135–145)
Total Bilirubin: 0.8 mg/dL (ref 0.3–1.2)
Total Protein: 7.7 g/dL (ref 6.5–8.1)

## 2019-01-06 LAB — CBC WITH DIFFERENTIAL (CANCER CENTER ONLY)
Abs Immature Granulocytes: 0.1 10*3/uL — ABNORMAL HIGH (ref 0.00–0.07)
Basophils Absolute: 0 10*3/uL (ref 0.0–0.1)
Basophils Relative: 1 %
Eosinophils Absolute: 0.2 10*3/uL (ref 0.0–0.5)
Eosinophils Relative: 3 %
HCT: 21 % — ABNORMAL LOW (ref 39.0–52.0)
Hemoglobin: 7 g/dL — CL (ref 13.0–17.0)
Immature Granulocytes: 2 %
Lymphocytes Relative: 30 %
Lymphs Abs: 1.9 10*3/uL (ref 0.7–4.0)
MCH: 33 pg (ref 26.0–34.0)
MCHC: 33.3 g/dL (ref 30.0–36.0)
MCV: 99.1 fL (ref 80.0–100.0)
Monocytes Absolute: 0.5 10*3/uL (ref 0.1–1.0)
Monocytes Relative: 8 %
Neutro Abs: 3.7 10*3/uL (ref 1.7–7.7)
Neutrophils Relative %: 56 %
Platelet Count: 64 10*3/uL — ABNORMAL LOW (ref 150–400)
RBC: 2.12 MIL/uL — ABNORMAL LOW (ref 4.22–5.81)
RDW: 19.9 % — ABNORMAL HIGH (ref 11.5–15.5)
WBC Count: 6.4 10*3/uL (ref 4.0–10.5)
nRBC: 0 % (ref 0.0–0.2)

## 2019-01-06 LAB — PREPARE RBC (CROSSMATCH)

## 2019-01-06 MED ORDER — SODIUM CHLORIDE 0.9% FLUSH
10.0000 mL | Freq: Once | INTRAVENOUS | Status: DC
  Filled 2019-01-06: qty 10

## 2019-01-06 MED ORDER — ACETAMINOPHEN 325 MG PO TABS: 650.0000 mg | ORAL_TABLET | Freq: Once | ORAL | Status: DC

## 2019-01-06 MED ORDER — HEPARIN SOD (PORK) LOCK FLUSH 100 UNIT/ML IV SOLN
500.0000 [IU] | Freq: Once | INTRAVENOUS | Status: DC
  Filled 2019-01-06: qty 5

## 2019-01-06 NOTE — Telephone Encounter (Signed)
Printed avs and calender of [place] appointment. Per 1/10 los

## 2019-01-06 NOTE — Progress Notes (Signed)
Pt presents today with wife for fatigue, increased SOB particularly at rest (hx CHF) that causes insomnia & anxiety, increased dizziness & weakness, and intermittent disorientation particularly at night.  Pt speaks slowly and does not make much eye contact, appears slightly somnolent and falls asleep during exam.  Oriented x4 currently.  No unilat weakness or facial droop noted.  Denies CP or fever/chills.  Type & screen drawn by RN Learta Codding from pt's port.  Pt verbalized understanding to keep blood bracelet on until transfusion on Monday.  Ambulatory w/steady gait to scheduling with wife & belongings.

## 2019-01-06 NOTE — Progress Notes (Signed)
[facility] Note  Spoke with wife [name] to reschedule appt. We plan to speak by phone on Tuesday to reschedule.   [place], [place], [facility] Pager [phone] Voicemail [phone]

## 2019-01-06 NOTE — Patient Instructions (Signed)
Anemia  Anemia is a condition in which you do not have enough red blood cells or hemoglobin. Hemoglobin is a substance in red blood cells that carries oxygen. When you do not have enough red blood cells or hemoglobin (are anemic), your body cannot get enough oxygen and your organs may not work properly. As a result, you may feel very tired or have other problems. What are the causes? Common causes of anemia include:  Excessive bleeding. Anemia can be caused by excessive bleeding inside or outside the body, including bleeding from the intestine or from periods in women.  Poor nutrition.  Long-lasting (chronic) kidney, thyroid, and liver disease.  Bone marrow disorders.  Cancer and treatments for cancer.  HIV (human immunodeficiency virus) and AIDS (acquired immunodeficiency syndrome).  Treatments for HIV and AIDS.  Spleen problems.  Blood disorders.  Infections, medicines, and autoimmune disorders that destroy red blood cells. What are the signs or symptoms? Symptoms of this condition include:  Minor weakness.  Dizziness.  Headache.  Feeling heartbeats that are irregular or faster than normal (palpitations).  Shortness of breath, especially with exercise.  Paleness.  Cold sensitivity.  Indigestion.  Nausea.  Difficulty sleeping.  Difficulty concentrating. Symptoms may occur suddenly or develop slowly. If your anemia is mild, you may not have symptoms. How is this diagnosed? This condition is diagnosed based on:  Blood tests.  Your medical history.  A physical exam.  Bone marrow biopsy. Your health care provider may also check your stool (feces) for blood and may do additional testing to look for the cause of your bleeding. You may also have other tests, including:  Imaging tests, such as a CT scan or MRI.  Endoscopy.  Colonoscopy. How is this treated? Treatment for this condition depends on the cause. If you continue to lose a lot of blood, you may  need to be treated at a hospital. Treatment may include:  Taking supplements of iron, vitamin S31, or folic acid.  Taking a hormone medicine (erythropoietin) that can help to stimulate red blood cell growth.  Having a blood transfusion. This may be needed if you lose a lot of blood.  Making changes to your diet.  Having surgery to remove your spleen. Follow these instructions at home:  Take over-the-counter and prescription medicines only as told by your health care provider.  Take supplements only as told by your health care provider.  Follow any diet instructions that you were given.  Keep all follow-up visits as told by your health care provider. This is important. Contact a health care provider if:  You develop new bleeding anywhere in the body. Get help right away if:  You are very weak.  You are short of breath.  You have pain in your abdomen or chest.  You are dizzy or feel faint.  You have trouble concentrating.  You have bloody or black, tarry stools.  You vomit repeatedly or you vomit up blood. Summary  Anemia is a condition in which you do not have enough red blood cells or enough of a substance in your red blood cells that carries oxygen (hemoglobin).  Symptoms may occur suddenly or develop slowly.  If your anemia is mild, you may not have symptoms.  This condition is diagnosed with blood tests as well as a medical history and physical exam. Other tests may be needed.  Treatment for this condition depends on the cause of the anemia. This information is not intended to replace advice given to you by  your health care provider. Make sure you discuss any questions you have with your health care provider. Document Released: 01/21/2005 Document Revised: 01/15/2017 Document Reviewed: 01/15/2017 Elsevier Interactive Patient Education  2019 [employer].

## 2019-01-07 ENCOUNTER — Inpatient Hospital Stay: Payer: Medicare HMO

## 2019-01-07 DIAGNOSIS — N189 Chronic kidney disease, unspecified: Secondary | ICD-10-CM | POA: Diagnosis not present

## 2019-01-07 DIAGNOSIS — C786 Secondary malignant neoplasm of retroperitoneum and peritoneum: Secondary | ICD-10-CM | POA: Diagnosis not present

## 2019-01-07 DIAGNOSIS — C679 Malignant neoplasm of bladder, unspecified: Secondary | ICD-10-CM | POA: Diagnosis not present

## 2019-01-07 DIAGNOSIS — Z933 Colostomy status: Secondary | ICD-10-CM | POA: Diagnosis not present

## 2019-01-07 DIAGNOSIS — R5383 Other fatigue: Secondary | ICD-10-CM

## 2019-01-07 DIAGNOSIS — K624 Stenosis of anus and rectum: Secondary | ICD-10-CM | POA: Diagnosis not present

## 2019-01-07 DIAGNOSIS — D631 Anemia in chronic kidney disease: Secondary | ICD-10-CM

## 2019-01-07 DIAGNOSIS — I509 Heart failure, unspecified: Secondary | ICD-10-CM | POA: Diagnosis not present

## 2019-01-07 MED ORDER — DIPHENHYDRAMINE HCL 25 MG PO CAPS
25.0000 mg | ORAL_CAPSULE | Freq: Once | ORAL | Status: AC
  Administered 2019-01-07: 25 mg via ORAL

## 2019-01-07 MED ORDER — FUROSEMIDE 10 MG/ML IJ SOLN
INTRAMUSCULAR | Status: AC
  Filled 2019-01-07: qty 2

## 2019-01-07 MED ORDER — SODIUM CHLORIDE 0.9% IV SOLUTION
250.0000 mL | Freq: Once | INTRAVENOUS | Status: AC
  Administered 2019-01-07: 250 mL via INTRAVENOUS
  Filled 2019-01-07: qty 250

## 2019-01-07 MED ORDER — SODIUM CHLORIDE 0.9% FLUSH
10.0000 mL | INTRAVENOUS | Status: AC | PRN
  Administered 2019-01-07: 10 mL
  Filled 2019-01-07: qty 10

## 2019-01-07 MED ORDER — FUROSEMIDE 10 MG/ML IJ SOLN
20.0000 mg | Freq: Once | INTRAMUSCULAR | Status: AC
  Administered 2019-01-07: 20 mg via INTRAVENOUS

## 2019-01-07 MED ORDER — HEPARIN SOD (PORK) LOCK FLUSH 100 UNIT/ML IV SOLN
500.0000 [IU] | Freq: Every day | INTRAVENOUS | Status: AC | PRN
  Administered 2019-01-07: 500 [IU]
  Filled 2019-01-07: qty 5

## 2019-01-07 MED ORDER — DIPHENHYDRAMINE HCL 25 MG PO CAPS
ORAL_CAPSULE | ORAL | Status: AC
  Filled 2019-01-07: qty 1

## 2019-01-07 NOTE — Patient Instructions (Signed)
Blood Transfusion, Adult, Care After This sheet gives you information about how to care for yourself after your procedure. Your doctor may also give you more specific instructions. If you have problems or questions, contact your doctor. Follow these instructions at home:   Take over-the-counter and prescription medicines only as told by your doctor.  Go back to your normal activities as told by your doctor.  Follow instructions from your doctor about how to take care of the area where an IV tube was put into your vein (insertion site). Make sure you: ? Wash your hands with soap and water before you change your bandage (dressing). If there is no soap and water, use hand sanitizer. ? Change your bandage as told by your doctor.  Check your IV insertion site every day for signs of infection. Check for: ? More redness, swelling, or pain. ? More fluid or blood. ? Warmth. ? Pus or a bad smell. Contact a doctor if:  You have more redness, swelling, or pain around the IV insertion site.  You have more fluid or blood coming from the IV insertion site.  Your IV insertion site feels warm to the touch.  You have pus or a bad smell coming from the IV insertion site.  Your pee (urine) turns pink, red, or brown.  You feel weak after doing your normal activities. Get help right away if:  You have signs of a serious allergic or body defense (immune) system reaction, including: ? Itchiness. ? Hives. ? Trouble breathing. ? Anxiety. ? Pain in your chest or lower back. ? Fever, flushing, and chills. ? Fast pulse. ? Rash. ? Watery poop (diarrhea). ? Throwing up (vomiting). ? Dark pee. ? Serious headache. ? Dizziness. ? Stiff neck. ? Yellow color in your face or the white parts of your eyes (jaundice). Summary  After a blood transfusion, return to your normal activities as told by your doctor.  Every day, check for signs of infection where the IV tube was put into your vein.  Some  signs of infection are warm skin, more redness and pain, more fluid or blood, and pus or a bad smell where the needle went in.  Contact your doctor if you feel weak or have any unusual symptoms. This information is not intended to replace advice given to you by your health care provider. Make sure you discuss any questions you have with your health care provider. Document Released: 01/04/2015 Document Revised: 08/07/2016 Document Reviewed: 08/07/2016 Elsevier Interactive Patient Education  2019 Elsevier Inc.  

## 2019-01-08 ENCOUNTER — Emergency Department (HOSPITAL_COMMUNITY): Payer: Medicare HMO

## 2019-01-08 ENCOUNTER — Emergency Department (HOSPITAL_COMMUNITY)
Admission: EM | Admit: 2019-01-08 | Discharge: 2019-01-08 | Disposition: A | Discharge status: Home / Self Care | Payer: Medicare HMO | Attending: Emergency Medicine | Admitting: Emergency Medicine

## 2019-01-08 ENCOUNTER — Encounter (HOSPITAL_COMMUNITY): Payer: Self-pay | Admitting: Emergency Medicine

## 2019-01-08 ENCOUNTER — Other Ambulatory Visit: Payer: Self-pay

## 2019-01-08 DIAGNOSIS — N390 Urinary tract infection, site not specified: Secondary | ICD-10-CM

## 2019-01-08 DIAGNOSIS — Z87891 Personal history of nicotine dependence: Secondary | ICD-10-CM | POA: Diagnosis not present

## 2019-01-08 DIAGNOSIS — Y732 Prosthetic and other implants, materials and accessory gastroenterology and urology devices associated with adverse incidents: Secondary | ICD-10-CM | POA: Diagnosis not present

## 2019-01-08 DIAGNOSIS — T83021A Displacement of indwelling urethral catheter, initial encounter: Secondary | ICD-10-CM | POA: Diagnosis not present

## 2019-01-08 DIAGNOSIS — R339 Retention of urine, unspecified: Secondary | ICD-10-CM | POA: Insufficient documentation

## 2019-01-08 DIAGNOSIS — N183 Chronic kidney disease, stage 3 (moderate): Secondary | ICD-10-CM | POA: Diagnosis not present

## 2019-01-08 DIAGNOSIS — Z79899 Other long term (current) drug therapy: Secondary | ICD-10-CM | POA: Insufficient documentation

## 2019-01-08 DIAGNOSIS — I2510 Atherosclerotic heart disease of native coronary artery without angina pectoris: Secondary | ICD-10-CM | POA: Insufficient documentation

## 2019-01-08 DIAGNOSIS — T83020A Displacement of cystostomy catheter, initial encounter: Secondary | ICD-10-CM | POA: Diagnosis not present

## 2019-01-08 DIAGNOSIS — Z8551 Personal history of malignant neoplasm of bladder: Secondary | ICD-10-CM | POA: Insufficient documentation

## 2019-01-08 DIAGNOSIS — I5022 Chronic systolic (congestive) heart failure: Secondary | ICD-10-CM | POA: Insufficient documentation

## 2019-01-08 DIAGNOSIS — I130 Hypertensive heart and chronic kidney disease with heart failure and stage 1 through stage 4 chronic kidney disease, or unspecified chronic kidney disease: Secondary | ICD-10-CM | POA: Insufficient documentation

## 2019-01-08 DIAGNOSIS — I252 Old myocardial infarction: Secondary | ICD-10-CM | POA: Insufficient documentation

## 2019-01-08 HISTORY — PX: IR CATHETER TUBE CHANGE: IMG717

## 2019-01-08 LAB — URINALYSIS, ROUTINE W REFLEX MICROSCOPIC
Bilirubin Urine: NEGATIVE
Glucose, UA: NEGATIVE mg/dL
Ketones, ur: NEGATIVE mg/dL
Nitrite: NEGATIVE
PROTEIN: 100 mg/dL — AB
RBC / HPF: >50 RBC/hpf — ABNORMAL HIGH (ref 0–5)
Specific Gravity, Urine: 1.013 (ref 1.005–1.030)
WBC, UA: >50 WBC/hpf — ABNORMAL HIGH (ref 0–5)
pH: 6 (ref 5.0–8.0)

## 2019-01-08 MED ORDER — IOPAMIDOL (ISOVUE-300) INJECTION 61%
50.0000 mL | Freq: Once | INTRAVENOUS | Status: AC | PRN
  Administered 2019-01-08: 5 mL

## 2019-01-08 MED ORDER — SODIUM CHLORIDE 0.9 % IV SOLN
1.0000 g | Freq: Once | INTRAVENOUS | Status: AC
  Administered 2019-01-08: 1 g via INTRAVENOUS
  Filled 2019-01-08: qty 10

## 2019-01-08 MED ORDER — LIDOCAINE HCL 1 % IJ SOLN
INTRAMUSCULAR | Status: AC
  Filled 2019-01-08: qty 20

## 2019-01-08 MED ORDER — MORPHINE SULFATE (PF) 4 MG/ML IV SOLN
4.0000 mg | Freq: Once | INTRAVENOUS | Status: AC
  Administered 2019-01-08: 4 mg via INTRAVENOUS
  Filled 2019-01-08: qty 1

## 2019-01-08 MED ORDER — LIDOCAINE VISCOUS HCL 2 % MT SOLN
OROMUCOSAL | Status: AC
  Filled 2019-01-08: qty 15

## 2019-01-08 MED ORDER — CEPHALEXIN 500 MG PO CAPS: 500.0000 mg | ORAL_CAPSULE | Freq: Four times a day (QID) | ORAL | 0 refills | Status: DC

## 2019-01-08 MED ORDER — IOPAMIDOL (ISOVUE-300) INJECTION 61%
INTRAVENOUS | Status: AC
  Administered 2019-01-08: 5 mL
  Filled 2019-01-08: qty 50

## 2019-01-08 NOTE — ED Provider Notes (Signed)
[facility] DEPT Provider Note   CSN: [id number] Arrival date & time: 01/08/19  8921     History   Chief Complaint Chief Complaint  Patient presents with  . Suprapubic Replaced    HPI Charles Coffey is a 84 y.o. male.  The history is provided by the patient and medical records. No language interpreter was used.     84 year old male with history of bladder cancer and a  suprapubic indwelling Foley catheter presenting to the ED with request for replacement of his suprapubic catheter.  History obtained through patient and through wife who is at bedside.  Patient accidentally dislodged his suprapubic catheter sometime last night.  He does not know what happened.  He has been having pain around the bladder region for the past 2 days according to the wife.  Today she noticed blood in the Foley bag after he has removed it.  Patient was here yesterday for blood transfusion.  Wife mentioned that patient has had increased confusion for the past several months, but this was discussed with his provider, it was felt that is likely related to his worsening CHF and not likely to be dementia.  He has histories of accidentally pulled out nephrostomy tubes in the past.  No report of nausea vomiting or diarrhea.      Past Medical History:  Diagnosis Date  . Aortic atherosclerosis ([place])   . BPH with urinary obstruction   . CAD (coronary artery disease)    a.  MI 1995, CABG x 3 2002 (patient says that he had LIMA and RIMA grafts). b. ETT-Cardiolite (10/15) with EF 48%, apical scar, no ischemia. c. Nuc 07/2017 abnormal -> cath was performed,  patent LIMA to LAD and SVG to OM 2. RIMA to RCA is atretic. The right coronary artery is occluded distally with extensive collaterals from the LAD, medical therapy.   . Cancer [facility])    bladder cancer  . Cervical spondylosis without myelopathy 11-02-2016  . Chronic low back pain    sees Dr. Kary Kos   . GERD (gastroesophageal  reflux disease)   . Hiatal hernia   . Hyperlipidemia   . Hypertension   . IBS (irritable bowel syndrome)   . Ischemic cardiomyopathy   . Myocardial infarction ([place])   . RBBB   . Restless legs syndrome   . Statin intolerance   . Syncope    a. in 2015 - no apparent cause, was taking sleep medicine at the time. Cardiac workup unremarkable.  . Tubular adenoma of colon     Patient Active Problem List   Diagnosis Date Noted  . Acute exacerbation of CHF (congestive heart failure) ([place]) 11/25/2018  . Fatigue 11/25/2018  . Chronic pain 11/25/2018  . Depression 11/25/2018  . GERD (gastroesophageal reflux disease) 11/25/2018  . Status post colostomy ([place]) 11/25/2018  . Moderate episode of recurrent major depressive disorder ([place])   . Bacterial infection due to Proteus mirabilis 11/09/2018  . Sepsis ([place]) 11/09/2018  . AKI (acute kidney injury) ([place]) 11/09/2018  . UTI (urinary tract infection) 11/06/2018  . Cancer associated pain   . Palliative care by specialist   . DNR (do not resuscitate)   . Agitation   . Sepsis due to urinary tract infection ([place]) 08/19/2018  . Hyponatremia 08/19/2018  . Ventral hernia without obstruction or gangrene 07/21/2018  . Displacement of nephrostomy tube ([place]) 07/05/2018  . Nephrostomy tube displaced ([place]) 07/05/2018  . Malfunction of nephrostomy tube ([place])   . Lower  urinary tract infectious disease 06/30/2018  . Port-A-Cath in place 03/31/2018  . PVC (premature ventricular contraction) 02/25/2018  . Malignant neoplasm of urinary bladder ([place]) 02/25/2018  . Catheter-associated urinary tract infection ([place]) 02/17/2018  . Nephrostomy complication ([place]) 67/11/4579  . Chronic systolic heart failure ([place]) 02/07/2018  . Malnutrition of moderate degree 02/01/2018  . Acute respiratory failure with hypoxia ([place]) 01/31/2018  . Acute CHF (congestive heart failure) ([place]) 01/31/2018  . Thrombocytopathia ([place])   . DVT (deep venous thrombosis) ([place]) 01/11/2018  .  Sepsis secondary to UTI ([place]) 01/01/2018  . Colostomy in place [facility]) 12/04/2017  . Rectal stenosis   . Low vitamin B12 level 11/30/2017  . Irritable bowel syndrome with constipation   . Pyelonephritis 11/28/2017  . Fever 11/28/2017  . Ileus ([place]) 11/28/2017  . Elevated troponin 11/28/2017  . Urothelial carcinoma of bladder ([place]) 11/28/2017  . Dehydration 11/28/2017  . Thrombocytopenia ([place]) 11/28/2017  . Hydronephrosis due to obstructive malignant neoplasm of bladder ([place]) 11/25/2017  . Chronic anemia 09/20/2017  . CKD (chronic kidney disease) stage 3, GFR 30-59 ml/min (H[facility]) 09/20/2017  . Angina pectoris ([place])   . Abnormal stress test   . Cervical spondylosis without myelopathy Oct 25, 202017  . PAD (peripheral artery disease) ([place]) 03/04/2015  . Coronary artery disease involving native heart without angina pectoris 11/01/2014  . Acid reflux 11/01/2014  . H/O male genital system disorder 11/01/2014  . RLS (restless legs syndrome) 11/01/2014  . Acne erythematosa 11/01/2014  . Syncope 09/24/2014  . Chronic low back pain 11/10/2013  . Ischemic cardiomyopathy 11/29/2012  . Hyperlipidemia LDL goal <70 10/15/2010  . RESTLESS LEGS SYNDROME 10/15/2010  . HTN (hypertension) 10/15/2010  . MYOCARDIAL INFARCTION, HX OF 10/15/2010  . Coronary artery disease of native heart with stable angina pectoris ([place]) 10/15/2010  . GERD 10/15/2010  . BENIGN PROSTATIC HYPERTROPHY 10/15/2010  . BENIGN PROSTATIC HYPERTROPHY, WITH OBSTRUCTION 10/15/2010  . COLONIC POLYPS, HX OF 10/15/2010    Past Surgical History:  Procedure Laterality Date  . COLONOSCOPY  10/17/2014   per Dr. Hilarie Fredrickson, tubular adenomas, repeat in 3 yrs   . COLONOSCOPY WITH PROPOFOL N/A 12/01/2017   Procedure: COLONOSCOPY WITH PROPOFOL;  Surgeon: Milus Banister, MD;  Location: [facility];  Service: Endoscopy;  Laterality: N/A;  . CYSTOSCOPY W/ RETROGRADES Left 11/25/2017   Procedure: CYSTOSCOPY WITH RETROGRADE PYELOGRAM;  Surgeon:  Irine Seal, MD;  Location: [facility];  Service: Urology;  Laterality: Left;  . HEART BYPASS    . IR CATHETER TUBE CHANGE  11/08/2018  . IR CATHETER TUBE CHANGE  12/26/2018  . IR CONVERT LEFT NEPHROSTOMY TO NEPHROURETERAL CATH  09/20/2018  . IR CONVERT RIGHT NEPHROSTOMY TO NEPHROURETERAL CATH  02/04/2018  . IR CONVERT RIGHT NEPHROSTOMY TO NEPHROURETERAL CATH  08/23/2018  . IR EXT NEPHROURETERAL CATH EXCHANGE  04/11/2018  . IR EXT NEPHROURETERAL CATH EXCHANGE  07/04/2018  . IR EXT NEPHROURETERAL CATH EXCHANGE  09/20/2018  . IR EXT NEPHROURETERAL CATH EXCHANGE  11/08/2018  . IR EXT NEPHROURETERAL CATH EXCHANGE  11/08/2018  . IR EXT NEPHROURETERAL CATH EXCHANGE  12/26/2018  . IR EXT NEPHROURETERAL CATH EXCHANGE  12/26/2018  . IR FLUORO GUIDE CV LINE RIGHT  12/27/2017  . IR FLUORO GUIDED NEEDLE PLC ASPIRATION/INJECTION LOC  08/19/2018  . IR NEPHROSTOGRAM LEFT THRU EXISTING ACCESS  12/24/2017  . IR NEPHROSTOMY EXCHANGE LEFT  01/28/2018  . IR NEPHROSTOMY EXCHANGE LEFT  02/04/2018  . IR NEPHROSTOMY EXCHANGE LEFT  02/25/2018  . IR NEPHROSTOMY EXCHANGE LEFT  07/04/2018  .  IR NEPHROSTOMY EXCHANGE LEFT  07/05/2018  . IR NEPHROSTOMY EXCHANGE LEFT  08/09/2018  . IR NEPHROSTOMY EXCHANGE LEFT  08/16/2018  . IR NEPHROSTOMY EXCHANGE LEFT  08/23/2018  . IR NEPHROSTOMY EXCHANGE RIGHT  12/24/2017  . IR NEPHROSTOMY EXCHANGE RIGHT  01/28/2018  . IR NEPHROSTOMY EXCHANGE RIGHT  04/11/2018  . IR NEPHROSTOMY EXCHANGE RIGHT  07/05/2018  . IR NEPHROSTOMY EXCHANGE RIGHT  08/09/2018  . IR NEPHROSTOMY EXCHANGE RIGHT  08/19/2018  . IR NEPHROSTOMY PLACEMENT LEFT  11/28/2017  . IR NEPHROSTOMY PLACEMENT RIGHT  12/03/2017  . IR PATIENT EVAL TECH 0-60 MINS  03/24/2018  . IR PATIENT EVAL TECH 0-60 MINS  03/28/2018  . IR PATIENT EVAL TECH 0-60 MINS  04/25/2018  . IR PATIENT EVAL TECH 0-60 MINS  06/20/2018  . IR US GUIDE VASC ACCESS RIGHT  12/27/2017  . LAPAROSCOPY N/A 12/01/2017   Procedure: LAPAROSCOPIC DIVERTING OSTOMY;  Surgeon: Stark Klein, MD;   Location: [facility];  Service: General;  Laterality: N/A;  . LEFT HEART CATH AND CORS/GRAFTS ANGIOGRAPHY N/A 08/25/2017   Procedure: LEFT HEART CATH AND CORS/GRAFTS ANGIOGRAPHY;  Surgeon: Wellington Hampshire, MD;  Location: [facility];  Service: Cardiovascular;  Laterality: N/A;  . LUMBAR FUSION  2003   L3-L4  . PROSTATE SURGERY  06-27-12   per Dr. Roni Bread, had CTT  . TONSILLECTOMY    . TRANSURETHRAL RESECTION OF BLADDER TUMOR N/A 11/25/2017   Procedure: TRANSURETHRAL RESECTION OF BLADDER TUMOR (TURBT);  Surgeon: Irine Seal, MD;  Location: [facility];  Service: Urology;  Laterality: N/A;        Home Medications    Prior to Admission medications   Medication Sig Start Date End Date Taking? Authorizing Provider  carvedilol (COREG) 3.125 MG tablet Take 3.125 mg by mouth 2 (two) times daily with a meal.    [provider]  famotidine (PEPCID) 20 MG tablet Take 1 tablet (20 mg total) by mouth 2 (two) times daily. 10/18/18   Wyatt Portela, MD  feeding supplement (BOOST HIGH PROTEIN) LIQD Take 1 Container by mouth 2 (two) times daily between meals.    [provider]  furosemide (LASIX) 40 MG tablet Take 40 mg by mouth 2 (two) times daily.    [provider]  hydrOXYzine (ATARAX/VISTARIL) 25 MG tablet Take 1 tablet (25 mg total) by mouth every 4 (four) hours as needed for itching. 11/17/18   [name], MD  lidocaine-prilocaine (EMLA) cream Apply 1 application topically as needed. 10/18/18   Wyatt Portela, MD  mirabegron ER (MYRBETRIQ) 25 MG TB24 tablet Take 25 mg by mouth daily.    [provider]  nitroGLYCERIN (NITROSTAT) 0.4 MG SL tablet PLACE 1 TABLET (0.4 MG TOTAL) UNDER THE TONGUE EVERY 5 (FIVE) MINUTES AS NEEDED. 09/27/18 12/26/18  Dunn, Nedra Hai, PA-C  oxyCODONE-acetaminophen (PERCOCET) 10-325 MG tablet Take 1 tablet by mouth every 4 (four) hours as needed for pain. 11/15/18   Wyatt Portela, MD  PARoxetine (PAXIL) 10 MG tablet Take 1 tablet (10 mg  total) by mouth daily. 11/29/18   Cameron Sprang, MD  pramipexole (MIRAPEX) 1 MG tablet TAKE 1 AND 1/2 TABLETS BY MOUTH AT BEDTIME Patient taking differently: Take 1.5 mg by mouth every evening.  06/29/18   [name], MD  triamcinolone cream (KENALOG) 0.1 % Apply topically 3 (three) times daily. To affected areas 07/04/18   Donne Hazel, MD    Family History Family History  Problem Relation Age of  Onset  . Heart attack Mother   . Aneurysm Father        femoral artery  . Heart disease Brother   . Heart disease Maternal Uncle        x 2  . Colon cancer Neg Hx   . Esophageal cancer Neg Hx   . Pancreatic cancer Neg Hx   . Kidney disease Neg Hx   . Liver disease Neg Hx     Social History Social History   Tobacco Use  . Smoking status: Former Smoker    Types: Cigarettes    Last attempt to quit: 12/28/1978    Years since quitting: 40.0  . Smokeless tobacco: Never Used  Substance Use Topics  . Alcohol use: Not Currently    Alcohol/week: 2.0 standard drinks    Types: 1 Glasses of wine, 1 Cans of beer per week    Comment: occassional  . Drug use: No     Allergies   Statins and Cyproheptadine   Review of Systems Review of Systems  All other systems reviewed and are negative.    Physical Exam Updated Vital Signs BP 116/72 (BP Location: Left Arm)   Pulse 97   Temp 97.8 F (36.6 C) (Oral)   Resp 18   Ht 5\' 8"  (1.727 m)   Wt 80.7 kg   SpO2 100%   BMI 27.06 kg/m   Physical Exam Vitals signs and nursing note reviewed.  Constitutional:      General: He is not in acute distress.    Appearance: He is well-developed.  HENT:     Head: Atraumatic.  Eyes:     Conjunctiva/sclera: Conjunctivae normal.  Neck:     Musculoskeletal: Neck supple.  Abdominal:     Comments: Abdomen mildly distended, Ostomy bag with normal stoma.  suprapupic cath site without foley catheter and passage is sealed off.  No surrounding skin erythema or discharge.   Skin:    Findings: No  rash.  Neurological:     Mental Status: He is alert.      ED Treatments / Results  Labs (all labs ordered are listed, but only abnormal results are displayed) Labs Reviewed  URINALYSIS, ROUTINE W REFLEX MICROSCOPIC - Abnormal; Notable for the following components:      Result Value   APPearance HAZY (*)    Hgb urine dipstick LARGE (*)    Protein, ur 100 (*)    Leukocytes, UA LARGE (*)    RBC / HPF >50 (*)    WBC, UA >50 (*)    Bacteria, UA RARE (*)    All other components within normal limits  URINE CULTURE    EKG None  Radiology Ir Catheter Tube Change  Result Date: 01/08/2019 INDICATION: 84 year old male with a history of bladder outlet obstruction. He has bilateral percutaneous nephroureteral tubes and a suprapubic catheter. Unfortunately, the suprapubic catheter was displaced last night and he now has urinary retention and requires urgent replacement. EXAM: Suprapubic catheter replacement COMPARISON:  Prior exam 12/26/2018 MEDICATIONS: Viscous lidocaine injected into the suprapubic catheter stoma ANESTHESIA/SEDATION: None CONTRAST:  15 mL injected into the bladder-administered into the collecting system(s) FLUOROSCOPY TIME:  Fluoroscopy Time: 0 minutes 54 seconds (17 mGy). COMPLICATIONS: None immediate. PROCEDURE: Informed written consent was obtained from the patient after a thorough discussion of the procedural risks, benefits and alternatives. All questions were addressed. Maximal Sterile Barrier Technique was utilized including caps, mask, sterile gowns, sterile gloves, sterile drape, hand hygiene and skin antiseptic. A timeout was  performed prior to the initiation of the procedure. A [place]pe the catheter was successfully navigated through the percutaneous tract and into the bladder. A short Amplatz wire was coiled within the bladder. A new 61 French balloon retention gastrostomy tube was then advanced over the wire and into the bladder. The retention balloon was filled with 10 mL  sterile saline. The external bumper was fixed in place. A gentle hand injection of contrast material opacifies a very large and distended bladder. The bladder was subsequently drained. The patient tolerated the procedure well. IMPRESSION: Successful replacement of a new 46 French balloon retention percutaneous gastrostomy tube as a suprapubic catheter. Electronically Signed   By: Jacqulynn Cadet M.D.   On: 01/08/2019 12:56    Procedures Reinsert Suprapubic Catheter Date/Time: 01/08/2019 8:36 AM Performed by: Domenic Moras, PA-C Authorized by: Domenic Moras, PA-C  Consent: Verbal consent obtained. Risks and benefits: risks, benefits and alternatives were discussed Consent given by: patient Patient understanding: patient states understanding of the procedure being performed Patient consent: the patient's understanding of the procedure matches consent given Patient identity confirmed: verbally with patient and arm band Time out: Immediately prior to procedure a "time out" was called to verify the correct patient, procedure, equipment, support staff and site/side marked as required. Preparation: Patient was prepped and draped in the usual sterile fashion. Local anesthesia used: no  Anesthesia: Local anesthesia used: no  Sedation: Patient sedated: no  Patient tolerance of procedure: attempted to reinsert foley without success.  no urine return.     (including critical care time)  Medications Ordered in ED Medications  lidocaine (XYLOCAINE) 2 % viscous mouth solution (has no administration in time range)  lidocaine (XYLOCAINE) 1 % (with pres) injection (has no administration in time range)  cefTRIAXone (ROCEPHIN) 1 g in sodium chloride 0.9 % 100 mL IVPB (1 g Intravenous New Bag/Given 01/08/19 1038)  morphine 4 MG/ML injection 4 mg (4 mg Intravenous Given 01/08/19 1039)  iopamidol (ISOVUE-300) 61 % injection 50 mL (5 mLs Other Contrast Given 01/08/19 1228)     Initial Impression /  Assessment and Plan / ED Course  I have reviewed the triage vital signs and the nursing notes.  Pertinent labs & imaging results that were available during my care of the patient were reviewed by me and considered in my medical decision making (see chart for details).     BP 98/70 (BP Location: Left Arm)   Pulse 78   Temp 97.8 F (36.6 C) (Oral)   Resp 18   Ht 5\' 8"  (1.727 m)   Wt 80.7 kg   SpO2 97%   BMI 27.06 kg/m    Final Clinical Impressions(s) / ED Diagnoses   Final diagnoses:  Dislodged Foley catheter ([place])  Acute lower UTI    ED Discharge Orders         Ordered    cephALEXin (KEFLEX) 500 MG capsule  4 times daily     01/08/19 1310          8:35 AM Pt dislodged his suprapubic catheter last night and despite several attempts to reinsert the foley both by me and by Dr. Ashok Cordia in the ER we were unable to advance the catheter.  I have consulted with IR who agrees to intervene and reestablish suprapubic foley.  Pt also endorse bladder discomfort x 2 days, will check UA.    10:21 AM Currently awaits IR to see pt.  Pt report peeing a small amount of urine through penis.  Now  having bilateral flank pain and increase urge to urinate.  UA shows large leukocytes and >50 WBC. Urine culture sent, rocephin and pain medication given.    1:14 PM Successful placement of suprapupic catheter by IR.  Pt felt much better.  Pt discharge with keflex as treatment for UTI.  Return precaution given.    Domenic Moras, PA-C 01/08/19 1314    Lajean Saver, MD 01/08/19 [phone]

## 2019-01-08 NOTE — ED Notes (Signed)
Patient transported to IR 

## 2019-01-08 NOTE — ED Triage Notes (Signed)
Patient usually has a suprapubic in. Patient accidentally pulled out his suprapubic and needs it replaced. Patient has a hx of bladder cancer.

## 2019-01-08 NOTE — ED Notes (Signed)
Bed: [facility] Expected date:  Expected time:  Means of arrival:  Comments: 

## 2019-01-09 ENCOUNTER — Encounter: Payer: Self-pay | Admitting: General Practice

## 2019-01-09 LAB — URINE CULTURE

## 2019-01-09 NOTE — Progress Notes (Signed)
[facility] Spiritual Care Note  Provided empathic listening via phone for wife [name] re caregiver stress and set up Spiritual Care appt for [name] at 2pm tomorrow.   [place], [place], [facility] Pager [phone] Voicemail [phone]

## 2019-01-10 ENCOUNTER — Encounter: Payer: Self-pay | Admitting: General Practice

## 2019-01-10 LAB — BPAM RBC
Blood Product Expiration Date: 202002092359
Blood Product Expiration Date: 202002092359
ISSUE DATE / TIME: 202001111020
Unit Type and Rh: 6200
Unit Type and Rh: 6200

## 2019-01-10 LAB — TYPE AND SCREEN
ABO/RH(D): A POS
Antibody Screen: NEGATIVE
Unit division: 0
Unit division: 0

## 2019-01-10 NOTE — Progress Notes (Signed)
[facility] Note  Met with Charles Coffey for one hour of pastoral presence and reflective listening as he conducted a life review, sharing about childrearing days and significant relationships & conflicts in his history. He plans to reflect and digest, discerning whether it would be helpful for him to meet again in this scheduled, deliberate way.   [place], [place], [facility] Pager [phone] Voicemail [phone]

## 2019-01-10 NOTE — Progress Notes (Signed)
[facility] Progress Note   Charles Coffey [id number] 02-May-1935 84 y.o.  Charles Coffey is managed by Charles Coffey  Actively treated with chemotherapy/immunotherapy/hormonal therapy: no  Assessment: Plan:    Fatigue, unspecified type - Plan: CBC with Differential ([place] Only), Draw extra clot specimen, CMP ([place] only), Practitioner attestation of consent, Complete patient signature process for consent form, Type and screen, Care order/instruction, Prepare RBC, acetaminophen (TYLENOL) tablet 650 mg, Prepare RBC, DISCONTINUED: heparin lock flush 100 unit/mL, DISCONTINUED: 0.9 %  sodium chloride infusion (Manually program via Guardrails IV Fluids), DISCONTINUED: sodium chloride flush (NS) 0.9 % injection 10 mL, DISCONTINUED: furosemide (LASIX) injection 20 mg, DISCONTINUED: diphenhydrAMINE (BENADRYL) capsule 25 mg, CANCELED: Transfuse RBC  Anemia due to chronic kidney disease, unspecified CKD stage - Plan: Practitioner attestation of consent, Complete patient signature process for consent form, Type and screen, Care order/instruction, Prepare RBC, acetaminophen (TYLENOL) tablet 650 mg, Prepare RBC, DISCONTINUED: heparin lock flush 100 unit/mL, DISCONTINUED: 0.9 %  sodium chloride infusion (Manually program via Guardrails IV Fluids), DISCONTINUED: sodium chloride flush (NS) 0.9 % injection 10 mL, DISCONTINUED: furosemide (LASIX) injection 20 mg, DISCONTINUED: diphenhydrAMINE (BENADRYL) capsule 25 mg, CANCELED: Transfuse RBC  Port-A-Cath in place - Plan: heparin lock flush 100 unit/mL, sodium chloride flush (NS) 0.9 % injection 10 mL  Urothelial carcinoma of bladder ([place]) - Plan: heparin lock flush 100 unit/mL, sodium chloride flush (NS) 0.9 % injection 10 mL   Fatigue and anemia: A CBC returned today with a hemoglobin of 7.0.  Plans will be to have the patient return on 01/07/2023 1 unit of packed red blood cells.  He may receive an additional unit of packed red  blood cells next week if he notes benefit from his transfusion tomorrow.  He has a history of congestive heart failure thus we are spacing out any planned transfusions.  Metastatic urothelial carcinoma the bladder: The patient continues to be followed by Charles Coffey.  He is currently receiving palliative care at home.  He he is recently had his nephrostomy tubes changed.  He and his wife have agreed to speak to someone from hospice to learn of any additional services that could be provided.  He is scheduled to return to see Charles Coffey and to have labs completed on 03/17/2019.  Please see After Visit Summary for patient specific instructions.  Future Appointments  Date Time Provider [place]  01/13/2019  3:00 PM [facility] [place] [facility] None  03/17/2019  9:15 AM [facility] LAB 5 [facility] None  03/17/2019  9:30 AM [facility] [place] [facility] None  03/17/2019 10:00 AM Charles Portela, MD [facility] None  03/20/2019  1:00 PM [facility] ROOM [facility] None  03/20/2019  2:30 PM [facility] 1 [facility] [place]  07/11/2019  3:30 PM Charles Sprang, MD [facility] None    Orders Placed This Encounter  Procedures  . CBC with Differential ([facility] Only)  . Draw extra clot specimen  . CMP ([place] only)  . Type and screen  . Prepare RBC       Subjective:   Patient ID:  Charles Coffey is a 84 y.o. (DOB [birth year]) male.  Chief Complaint:  Chief Complaint  Patient presents with  . Fatigue    HPI Charles Coffey is an 84 year old male with a history of a metastatic bladder cancer who is followed by Charles Coffey and is currently being managed with active surveillance.  He is currently receiving home palliative care support.  They  had suggested the possibility of using Epogen to treat his anemia.  He has congestive heart failure and has difficulty sleeping at night due to his shortness of breath.  He is also having what appears to be sundowning.  He is having increasing  forgetfulness with intermittent disorientation.  He is recently been treated with antibiotics for a UTI but has completed that treatment.  He recently had his nephrostomy tubes changed.  His wife reports that he has been having episodic chest pain.  She had to give him a nitro tablet this morning.  Medications: I have reviewed the patient's current medications.  Allergies:  Allergies  Allergen Reactions  . Statins Other (See Comments)    liver effects  . Cyproheptadine Other (See Comments)    Unable to urinate    Past Medical History:  Diagnosis Date  . Aortic atherosclerosis ([place])   . BPH with urinary obstruction   . CAD (coronary artery disease)    a.  MI 1995, CABG x 3 2002 (patient says that he had LIMA and RIMA grafts). b. ETT-Cardiolite (10/15) with EF 48%, apical scar, no ischemia. c. Nuc 07/2017 abnormal -> cath was performed,  patent LIMA to LAD and SVG to OM 2. RIMA to RCA is atretic. The right coronary artery is occluded distally with extensive collaterals from the LAD, medical therapy.   . Cancer [facility])    bladder cancer  . Cervical spondylosis without myelopathy 2020-07-116  . Chronic low back pain    sees Dr. Kary Coffey   . GERD (gastroesophageal reflux disease)   . Hiatal hernia   . Hyperlipidemia   . Hypertension   . IBS (irritable bowel syndrome)   . Ischemic cardiomyopathy   . Myocardial infarction ([place])   . RBBB   . Restless legs syndrome   . Statin intolerance   . Syncope    a. in 2015 - no apparent cause, was taking sleep medicine at the time. Cardiac workup unremarkable.  . Tubular adenoma of colon     Past Surgical History:  Procedure Laterality Date  . COLONOSCOPY  10/17/2014   per Dr. Hilarie Coffey, tubular adenomas, repeat in 3 yrs   . COLONOSCOPY WITH PROPOFOL N/A 12/01/2017   Procedure: COLONOSCOPY WITH PROPOFOL;  Surgeon: Charles Banister, MD;  [facility];  Service: Endoscopy;  Laterality: N/A;  . CYSTOSCOPY W/ RETROGRADES Left 11/25/2017    Procedure: CYSTOSCOPY WITH RETROGRADE PYELOGRAM;  Surgeon: Charles Seal, MD;  [facility];  Service: Urology;  Laterality: Left;  . HEART BYPASS    . IR CATHETER TUBE CHANGE  11/08/2018  . IR CATHETER TUBE CHANGE  12/26/2018  . IR CATHETER TUBE CHANGE  01/08/2019  . IR CONVERT LEFT NEPHROSTOMY TO NEPHROURETERAL CATH  09/20/2018  . IR CONVERT RIGHT NEPHROSTOMY TO NEPHROURETERAL CATH  02/04/2018  . IR CONVERT RIGHT NEPHROSTOMY TO NEPHROURETERAL CATH  08/23/2018  . IR EXT NEPHROURETERAL CATH EXCHANGE  04/11/2018  . IR EXT NEPHROURETERAL CATH EXCHANGE  07/04/2018  . IR EXT NEPHROURETERAL CATH EXCHANGE  09/20/2018  . IR EXT NEPHROURETERAL CATH EXCHANGE  11/08/2018  . IR EXT NEPHROURETERAL CATH EXCHANGE  11/08/2018  . IR EXT NEPHROURETERAL CATH EXCHANGE  12/26/2018  . IR EXT NEPHROURETERAL CATH EXCHANGE  12/26/2018  . IR FLUORO GUIDE CV LINE RIGHT  12/27/2017  . IR FLUORO GUIDED NEEDLE PLC ASPIRATION/INJECTION LOC  08/19/2018  . IR NEPHROSTOGRAM LEFT THRU EXISTING ACCESS  12/24/2017  . IR NEPHROSTOMY EXCHANGE LEFT  01/28/2018  . IR NEPHROSTOMY  EXCHANGE LEFT  02/04/2018  . IR NEPHROSTOMY EXCHANGE LEFT  02/25/2018  . IR NEPHROSTOMY EXCHANGE LEFT  07/04/2018  . IR NEPHROSTOMY EXCHANGE LEFT  07/05/2018  . IR NEPHROSTOMY EXCHANGE LEFT  08/09/2018  . IR NEPHROSTOMY EXCHANGE LEFT  08/16/2018  . IR NEPHROSTOMY EXCHANGE LEFT  08/23/2018  . IR NEPHROSTOMY EXCHANGE RIGHT  12/24/2017  . IR NEPHROSTOMY EXCHANGE RIGHT  01/28/2018  . IR NEPHROSTOMY EXCHANGE RIGHT  04/11/2018  . IR NEPHROSTOMY EXCHANGE RIGHT  07/05/2018  . IR NEPHROSTOMY EXCHANGE RIGHT  08/09/2018  . IR NEPHROSTOMY EXCHANGE RIGHT  08/19/2018  . IR NEPHROSTOMY PLACEMENT LEFT  11/28/2017  . IR NEPHROSTOMY PLACEMENT RIGHT  12/03/2017  . IR PATIENT EVAL TECH 0-60 MINS  03/24/2018  . IR PATIENT EVAL TECH 0-60 MINS  03/28/2018  . IR PATIENT EVAL TECH 0-60 MINS  04/25/2018  . IR PATIENT EVAL TECH 0-60 MINS  06/20/2018  . IR US GUIDE VASC ACCESS RIGHT  12/27/2017  .  LAPAROSCOPY N/A 12/01/2017   Procedure: LAPAROSCOPIC DIVERTING OSTOMY;  Surgeon: Stark Klein, MD;  [facility];  Service: General;  Laterality: N/A;  . LEFT HEART CATH AND CORS/GRAFTS ANGIOGRAPHY N/A 08/25/2017   Procedure: LEFT HEART CATH AND CORS/GRAFTS ANGIOGRAPHY;  Surgeon: [place] Hampshire, MD;  Location: [facility] CV LAB;  Service: Cardiovascular;  Laterality: N/A;  . LUMBAR FUSION  2003   L3-L4  . PROSTATE SURGERY  06-27-12   per Dr. Roni Bread, had CTT  . TONSILLECTOMY    . TRANSURETHRAL RESECTION OF BLADDER TUMOR N/A 11/25/2017   Procedure: TRANSURETHRAL RESECTION OF BLADDER TUMOR (TURBT);  Surgeon: Charles Seal, MD;  [facility];  Service: Urology;  Laterality: N/A;    Family History  Problem Relation Age of Onset  . Heart attack Mother   . Aneurysm Father        femoral artery  . Heart disease Brother   . Heart disease Maternal Uncle        x 2  . Colon cancer Neg Hx   . Esophageal cancer Neg Hx   . Pancreatic cancer Neg Hx   . Kidney disease Neg Hx   . Liver disease Neg Hx     Social History   Socioeconomic History  . Marital status: Married    Spouse name: Not on file  . Number of children: 5  . Years of education: Some coll  . Highest education level: Not on file  Occupational History  . Occupation: retired  Scientific laboratory technician  . Financial resource strain: Not on file  . Food insecurity:    Worry: Not on file    Inability: Not on file  . Transportation needs:    Medical: Not on file    Non-medical: Not on file  Tobacco Use  . Smoking status: Former Smoker    Types: Cigarettes    Last attempt to quit: 12/28/1978    Years since quitting: 40.0  . Smokeless tobacco: Never Used  Substance and Sexual Activity  . Alcohol use: Not Currently    Alcohol/week: 2.0 standard drinks    Types: 1 Glasses of wine, 1 Cans of beer per week    Comment: occassional  . Drug use: No  . Sexual activity: Not Currently  Lifestyle  . Physical activity:    Days per week:  Not on file    Minutes per session: Not on file  . Stress: Not on file  Relationships  . Social connections:    Talks on phone: Not  on file    Gets together: Not on file    Attends religious service: Not on file    Active member of club or organization: Not on file    Attends meetings of clubs or organizations: Not on file    Relationship status: Not on file  . Intimate partner violence:    Fear of current or ex partner: Not on file    Emotionally abused: Not on file    Physically abused: Not on file    Forced sexual activity: Not on file  Other Topics Concern  . Not on file  Social History Narrative   Lives at home w/ his wife   Right-handed   5 cups of coffee per day    Past Medical History, Surgical history, Social history, and Family history were reviewed and updated as appropriate.   Please see review of systems for further details on the patient's review from today.   Review of Systems:  Review of Systems  Constitutional: Positive for fatigue. Negative for appetite change, chills, diaphoresis and fever.  HENT: Negative for dental problem, mouth sores and trouble swallowing.   Respiratory: Positive for shortness of breath. Negative for cough and chest tightness.   Cardiovascular: Positive for chest pain. Negative for palpitations.  Gastrointestinal: Negative for constipation, diarrhea, nausea and vomiting.  Neurological: Positive for weakness. Negative for dizziness, syncope and headaches.  Psychiatric/Behavioral: Positive for agitation, behavioral problems, confusion and sleep disturbance.    Objective:   Physical Exam:  BP 104/68 (BP Location: Left Arm, Patient Position: Sitting)   Pulse 91   Temp 97.6 F (36.4 C) (Oral)   Resp 18   Ht 5\' 8"  (1.727 m)   Wt 183 lb 14.4 oz (83.4 kg)   SpO2 98%   BMI 27.96 kg/m  ECOG: 2  Physical Exam Constitutional:      General: He is not in acute distress.    Appearance: He is not diaphoretic.  HENT:     Head:  Normocephalic and atraumatic.  Musculoskeletal:     Right lower leg: Edema present.     Left lower leg: Edema present.  Skin:    General: Skin is warm and dry.     Findings: No erythema or rash.  Neurological:     Mental Status: He is lethargic.    The patient is lethargic and continues to fall asleep during his visit today.   Lab Review:     Component Value Date/Time   NA 136 01/06/2019 1227   NA 137 11/14/2018 1656   NA 133 (L) 12/29/2017 1014   K 4.3 01/06/2019 1227   K 4.4 12/29/2017 1014   CL 100 01/06/2019 1227   CO2 25 01/06/2019 1227   CO2 20 (L) 12/29/2017 1014   GLUCOSE 129 (H) 01/06/2019 1227   GLUCOSE 102 12/29/2017 1014   BUN 43 (H) 01/06/2019 1227   BUN 35 (H) 11/14/2018 1656   BUN 31.5 (H) 12/29/2017 1014   CREATININE 2.57 (H) 01/06/2019 1227   CREATININE 2.22 (H) 12/20/2018 1211   CREATININE 1.6 (H) 12/29/2017 1014   CALCIUM 9.7 01/06/2019 1227   CALCIUM 9.6 12/29/2017 1014   PROT 7.7 01/06/2019 1227   PROT 6.8 12/29/2017 1014   ALBUMIN 3.7 01/06/2019 1227   ALBUMIN 3.8 12/29/2017 1014   AST 20 01/06/2019 1227   AST 15 12/29/2017 1014   ALT 14 01/06/2019 1227   ALT 13 12/29/2017 1014   ALKPHOS 83 01/06/2019 1227   ALKPHOS 61 12/29/2017 1014  BILITOT 0.8 01/06/2019 1227   BILITOT 0.43 12/29/2017 1014   GFRNONAA 22 (L) 01/06/2019 1227   GFRAA 26 (L) 01/06/2019 1227       Component Value Date/Time   WBC 6.4 01/06/2019 1227   WBC 5.9 12/26/2018 1217   RBC 2.12 (L) 01/06/2019 1227   HGB 7.0 (LL) 01/06/2019 1227   HGB 8.2 (L) 11/14/2018 1656   HGB 8.8 (L) 12/29/2017 1014   HCT 21.0 (L) 01/06/2019 1227   HCT 24.4 (L) 11/14/2018 1656   HCT 26.0 (L) 12/29/2017 1014   PLT 64 (L) 01/06/2019 1227   PLT 64 (LL) 11/14/2018 1656   MCV 99.1 01/06/2019 1227   MCV 96 11/14/2018 1656   MCV 89.3 12/29/2017 1014   MCH 33.0 01/06/2019 1227   MCHC 33.3 01/06/2019 1227   RDW 19.9 (H) 01/06/2019 1227   RDW 18.8 (H) 11/14/2018 1656   RDW 15.5 (H)  12/29/2017 1014   LYMPHSABS 1.9 01/06/2019 1227   LYMPHSABS 1.4 12/29/2017 1014   MONOABS 0.5 01/06/2019 1227   MONOABS 0.6 12/29/2017 1014   EOSABS 0.2 01/06/2019 1227   EOSABS 0.2 12/29/2017 1014   BASOSABS 0.0 01/06/2019 1227   BASOSABS 0.0 12/29/2017 1014   -------------------------------  Imaging from last 24 hours (if applicable):  Radiology interpretation: Ir Catheter Tube Change  Result Date: 01/08/2019 INDICATION: 84 year old male with a history of bladder outlet obstruction. He has bilateral percutaneous nephroureteral tubes and a suprapubic catheter. Unfortunately, the suprapubic catheter was displaced last night and he now has urinary retention and requires urgent replacement. EXAM: Suprapubic catheter replacement COMPARISON:  Prior exam 12/26/2018 MEDICATIONS: Viscous lidocaine injected into the suprapubic catheter stoma ANESTHESIA/SEDATION: None CONTRAST:  15 mL injected into the bladder-administered into the collecting system(s) FLUOROSCOPY TIME:  Fluoroscopy Time: 0 minutes 54 seconds (17 mGy). COMPLICATIONS: None immediate. PROCEDURE: Informed written consent was obtained from the patient after a thorough discussion of the procedural risks, benefits and alternatives. All questions were addressed. Maximal Sterile Barrier Technique was utilized including caps, mask, sterile gowns, sterile gloves, sterile drape, hand hygiene and skin antiseptic. A timeout was performed prior to the initiation of the procedure. A [place] the catheter was successfully navigated through the percutaneous tract and into the bladder. A short Amplatz wire was coiled within the bladder. A new 31 French balloon retention gastrostomy tube was then advanced over the wire and into the bladder. The retention balloon was filled with 10 mL sterile saline. The external bumper was fixed in place. A gentle hand injection of contrast material opacifies a very large and distended bladder. The bladder was subsequently  drained. The patient tolerated the procedure well. IMPRESSION: Successful replacement of a new 41 French balloon retention percutaneous gastrostomy tube as a suprapubic catheter. Electronically Signed   By: Jacqulynn Cadet M.D.   On: 01/08/2019 12:56   Ir Catheter Tube Change  Result Date: 12/26/2018 INDICATION: Complex history significant for bladder cancer post placement of bilateral nephroureteral catheters and ultimately placement of a suprapubic catheter Patient presents today for routine fluoroscopic guided exchange of all pre-existing catheters. EXAM: FLUOROSCOPIC GUIDED BILATERAL SIDED NEPHROSTOMY CATHETER EXCHANGE COMPARISON:  Routine fluoroscopic guided exchange of bilateral nephroureteral catheters and suprapubic catheter-11/08/2028 CONTRAST:  A total of 20 mL Isovue-300 administered was administered into both collecting systems and the urinary bladder FLUOROSCOPY TIME:  1 minutes, 36 seconds (14.6 mGy) ANESTHESIA/SEDATION: Moderate (conscious) sedation was employed during this procedure. A total of Versed 4 mg and Fentanyl 50 mcg was administered intravenously. Moderate Sedation  Time: 26 minutes. The patient's level of consciousness and vital signs were monitored continuously by radiology nursing throughout the procedure under my direct supervision. MEDICATIONS: Rocephin 1 g IV; the antibiotics were administered within an appropriate time frame prior to the initiation of the procedure. COMPLICATIONS: COMPLICATIONS None immediate: TECHNIQUE: Informed written consent was obtained from the patient after a discussion of the risks, benefits and alternatives to treatment. Questions regarding the procedure were encouraged and answered. A timeout was performed prior to the initiation of the procedure. The bilateral flanks and external portions of existing nephroureteral catheters were prepped and draped in the usual sterile fashion. A sterile drape was applied covering the operative field. Maximum barrier  sterile technique with sterile gowns and gloves were used for the procedure. A timeout was performed prior to the initiation of the procedure. A pre procedural spot fluoroscopic image was obtained. Beginning with the left-sided nephroureteral catheter, a small amount of contrast was injected via the existing left-sided nephrostomy catheter demonstrating appropriate positioning within the renal pelvis. The existing catheter was cut and cannulated with an Amplatz wire which was coiled within the urinary bladder. Under intermittent fluoroscopic guidance, the existing nephroureteral catheter was exchanged for a new 10.2 French 24 cm nephroureteral catheter with superior coil within the left renal pelvis and inferior coil within the urinary bladder. Limited contrast injection confirmed appropriate positioning within the left renal pelvis and a post exchange fluoroscopic image was obtained. The catheter was locked and capped. The identical procedure was repeated for the contralateral right kidney ultimately allowing placement of a new 10.2 French, 22 cm nephroureteral catheter with superior coil within the right renal pelvis and inferior coil within the urinary bladder. The right-sided nephroureteral catheter was also capped. Patient was then positioned supine on the fluoroscopy table and the external portion of the suprapubic catheter as well as the surrounding skin was prepped and draped in usual sterile fashion Preprocedural spot fluoroscopic image was obtained of the lower pelvis and existing suprapubic catheter Small amount of contrast was injected via the existing suprapubic catheter demonstrating appropriate position functionality. The external portion suprapubic catheter was cut and cannulated with a short Amplatz wire which was coiled within the urinary bladder. Under intermittent fluoroscopic guidance, the existing 16 [place] all-purpose drain was exchanged for a new 16 [place] all-purpose drainage catheter with  end coiled and locked within the urinary bladder. The suprapubic catheter was connected to a gravity bag. A dressing was placed. The patient tolerated the above procedures well without immediate postprocedural complication. FINDINGS: The existing bilateral nephroureteral are appropriately positioned and functioning. After successful fluoroscopic guided exchange, new bilateral 10.2 French nephroureteral catheters and 16 French all-purpose drainage catheter serving as a suprapubic catheter. IMPRESSION: 1. Successful fluoroscopic guided exchange of bilateral 10.2 French percutaneous nephroureteral catheters, the left measuring 24 cm and the right measuring 22 cm. Both nephroureteral catheters were again capped. 2. Successful fluoroscopic guided exchange 16 French suprapubic catheter. Electronically Signed   By: Sandi Mariscal M.D.   On: 12/26/2018 15:35   Ir Ext Nephroureteral Cath Exchange  Result Date: 12/26/2018 INDICATION: Complex history significant for bladder cancer post placement of bilateral nephroureteral catheters and ultimately placement of a suprapubic catheter Patient presents today for routine fluoroscopic guided exchange of all pre-existing catheters. EXAM: FLUOROSCOPIC GUIDED BILATERAL SIDED NEPHROSTOMY CATHETER EXCHANGE COMPARISON:  Routine fluoroscopic guided exchange of bilateral nephroureteral catheters and suprapubic catheter-11/08/2028 CONTRAST:  A total of 20 mL Isovue-300 administered was administered into both collecting systems and the urinary bladder  FLUOROSCOPY TIME:  1 minutes, 36 seconds (14.6 mGy) ANESTHESIA/SEDATION: Moderate (conscious) sedation was employed during this procedure. A total of Versed 4 mg and Fentanyl 50 mcg was administered intravenously. Moderate Sedation Time: 26 minutes. The patient's level of consciousness and vital signs were monitored continuously by radiology nursing throughout the procedure under my direct supervision. MEDICATIONS: Rocephin 1 g IV; the  antibiotics were administered within an appropriate time frame prior to the initiation of the procedure. COMPLICATIONS: COMPLICATIONS None immediate: TECHNIQUE: Informed written consent was obtained from the patient after a discussion of the risks, benefits and alternatives to treatment. Questions regarding the procedure were encouraged and answered. A timeout was performed prior to the initiation of the procedure. The bilateral flanks and external portions of existing nephroureteral catheters were prepped and draped in the usual sterile fashion. A sterile drape was applied covering the operative field. Maximum barrier sterile technique with sterile gowns and gloves were used for the procedure. A timeout was performed prior to the initiation of the procedure. A pre procedural spot fluoroscopic image was obtained. Beginning with the left-sided nephroureteral catheter, a small amount of contrast was injected via the existing left-sided nephrostomy catheter demonstrating appropriate positioning within the renal pelvis. The existing catheter was cut and cannulated with an Amplatz wire which was coiled within the urinary bladder. Under intermittent fluoroscopic guidance, the existing nephroureteral catheter was exchanged for a new 10.2 French 24 cm nephroureteral catheter with superior coil within the left renal pelvis and inferior coil within the urinary bladder. Limited contrast injection confirmed appropriate positioning within the left renal pelvis and a post exchange fluoroscopic image was obtained. The catheter was locked and capped. The identical procedure was repeated for the contralateral right kidney ultimately allowing placement of a new 10.2 French, 22 cm nephroureteral catheter with superior coil within the right renal pelvis and inferior coil within the urinary bladder. The right-sided nephroureteral catheter was also capped. Patient was then positioned supine on the fluoroscopy table and the external  portion of the suprapubic catheter as well as the surrounding skin was prepped and draped in usual sterile fashion Preprocedural spot fluoroscopic image was obtained of the lower pelvis and existing suprapubic catheter Small amount of contrast was injected via the existing suprapubic catheter demonstrating appropriate position functionality. The external portion suprapubic catheter was cut and cannulated with a short Amplatz wire which was coiled within the urinary bladder. Under intermittent fluoroscopic guidance, the existing 16 [place] all-purpose drain was exchanged for a new 16 [place] all-purpose drainage catheter with end coiled and locked within the urinary bladder. The suprapubic catheter was connected to a gravity bag. A dressing was placed. The patient tolerated the above procedures well without immediate postprocedural complication. FINDINGS: The existing bilateral nephroureteral are appropriately positioned and functioning. After successful fluoroscopic guided exchange, new bilateral 10.2 French nephroureteral catheters and 16 French all-purpose drainage catheter serving as a suprapubic catheter. IMPRESSION: 1. Successful fluoroscopic guided exchange of bilateral 10.2 French percutaneous nephroureteral catheters, the left measuring 24 cm and the right measuring 22 cm. Both nephroureteral catheters were again capped. 2. Successful fluoroscopic guided exchange 16 French suprapubic catheter. Electronically Signed   By: Sandi Mariscal M.D.   On: 12/26/2018 15:35   Ir Ext Nephroureteral Cath Exchange  Result Date: 12/26/2018 INDICATION: Complex history significant for bladder cancer post placement of bilateral nephroureteral catheters and ultimately placement of a suprapubic catheter Patient presents today for routine fluoroscopic guided exchange of all pre-existing catheters. EXAM: FLUOROSCOPIC GUIDED BILATERAL SIDED NEPHROSTOMY CATHETER  EXCHANGE COMPARISON:  Routine fluoroscopic guided exchange of  bilateral nephroureteral catheters and suprapubic catheter-11/08/2028 CONTRAST:  A total of 20 mL Isovue-300 administered was administered into both collecting systems and the urinary bladder FLUOROSCOPY TIME:  1 minutes, 36 seconds (14.6 mGy) ANESTHESIA/SEDATION: Moderate (conscious) sedation was employed during this procedure. A total of Versed 4 mg and Fentanyl 50 mcg was administered intravenously. Moderate Sedation Time: 26 minutes. The patient's level of consciousness and vital signs were monitored continuously by radiology nursing throughout the procedure under my direct supervision. MEDICATIONS: Rocephin 1 g IV; the antibiotics were administered within an appropriate time frame prior to the initiation of the procedure. COMPLICATIONS: COMPLICATIONS None immediate: TECHNIQUE: Informed written consent was obtained from the patient after a discussion of the risks, benefits and alternatives to treatment. Questions regarding the procedure were encouraged and answered. A timeout was performed prior to the initiation of the procedure. The bilateral flanks and external portions of existing nephroureteral catheters were prepped and draped in the usual sterile fashion. A sterile drape was applied covering the operative field. Maximum barrier sterile technique with sterile gowns and gloves were used for the procedure. A timeout was performed prior to the initiation of the procedure. A pre procedural spot fluoroscopic image was obtained. Beginning with the left-sided nephroureteral catheter, a small amount of contrast was injected via the existing left-sided nephrostomy catheter demonstrating appropriate positioning within the renal pelvis. The existing catheter was cut and cannulated with an Amplatz wire which was coiled within the urinary bladder. Under intermittent fluoroscopic guidance, the existing nephroureteral catheter was exchanged for a new 10.2 French 24 cm nephroureteral catheter with superior coil within the  left renal pelvis and inferior coil within the urinary bladder. Limited contrast injection confirmed appropriate positioning within the left renal pelvis and a post exchange fluoroscopic image was obtained. The catheter was locked and capped. The identical procedure was repeated for the contralateral right kidney ultimately allowing placement of a new 10.2 French, 22 cm nephroureteral catheter with superior coil within the right renal pelvis and inferior coil within the urinary bladder. The right-sided nephroureteral catheter was also capped. Patient was then positioned supine on the fluoroscopy table and the external portion of the suprapubic catheter as well as the surrounding skin was prepped and draped in usual sterile fashion Preprocedural spot fluoroscopic image was obtained of the lower pelvis and existing suprapubic catheter Small amount of contrast was injected via the existing suprapubic catheter demonstrating appropriate position functionality. The external portion suprapubic catheter was cut and cannulated with a short Amplatz wire which was coiled within the urinary bladder. Under intermittent fluoroscopic guidance, the existing 16 [place] all-purpose drain was exchanged for a new 16 [place] all-purpose drainage catheter with end coiled and locked within the urinary bladder. The suprapubic catheter was connected to a gravity bag. A dressing was placed. The patient tolerated the above procedures well without immediate postprocedural complication. FINDINGS: The existing bilateral nephroureteral are appropriately positioned and functioning. After successful fluoroscopic guided exchange, new bilateral 10.2 French nephroureteral catheters and 16 French all-purpose drainage catheter serving as a suprapubic catheter. IMPRESSION: 1. Successful fluoroscopic guided exchange of bilateral 10.2 French percutaneous nephroureteral catheters, the left measuring 24 cm and the right measuring 22 cm. Both nephroureteral  catheters were again capped. 2. Successful fluoroscopic guided exchange 16 French suprapubic catheter. Electronically Signed   By: Sandi Mariscal M.D.   On: 12/26/2018 15:35        This patient was seen with Charles Coffey with my  treatment plan reviewed with him. He expressed agreement with my medical management of this patient.   Patient seen and examined personally today.  84 year old man with advanced transitional cell carcinoma with failure to thrive and anemia.  He appeared pale and tachycardic and I recommended proceeding with transfusion and recommended hospice moving forward.  All his questions were answered today.  25 minutes was spent today with 50% of the time was dedicated to answering questions regarding future plan of care and prognosis.

## 2019-01-11 ENCOUNTER — Telehealth: Payer: Self-pay | Admitting: Internal Medicine

## 2019-01-11 ENCOUNTER — Telehealth: Payer: Self-pay | Admitting: Family Medicine

## 2019-01-11 MED ORDER — OLANZAPINE 5 MG PO TABS: 5.0000 mg | ORAL_TABLET | Freq: Every day | ORAL | 2 refills | Status: DC

## 2019-01-11 MED ORDER — TRIAMCINOLONE ACETONIDE 0.1 % EX CREA: TOPICAL_CREAM | Freq: Two times a day (BID) | CUTANEOUS | 5 refills | Status: DC | PRN

## 2019-01-11 NOTE — Telephone Encounter (Signed)
Patient's wife calling in regards to patient's swelling in his feet and ankles.  He is taking Lasix 120 mg daily.  The patient has a lot of other morbidities which are playing a major role here.  He recently had a blood transfusion, because of a HgB of 7, which may have loaded volume.    I informed him to go to the ED if the swelling does not subside, but the patient refuses.  She was thankful for the call and time spent.

## 2019-01-11 NOTE — Telephone Encounter (Signed)
Increase the Zyprexa to 5 mg and give it every evening around 5 or 6 PM. Call in #30 with 2 rf. Also call in Triamcinolone 0.1% cream to apply to the ostomy site bid prn itching, 30 grams with 2 rf

## 2019-01-11 NOTE — Telephone Encounter (Signed)
Dr. Fry please advise. Thanks  

## 2019-01-11 NOTE — Telephone Encounter (Signed)
Called and spoke with Charles Coffey from [facility] and she is aware of the meds that have been sent to the pharmacy.  Increase the zyprexa to 5 mg daily between 5-6 pm.  She will let the wife know of the change in meds.  Nothing further is needed.

## 2019-01-11 NOTE — Telephone Encounter (Signed)
New Message  Pt c/o swelling: STAT is pt has developed SOB within 24 hours  1) How much weight have you gained and in what time span? 5lbs in a week  2) If swelling, where is the swelling located? Feet and ankles   3) Are you currently taking a fluid pill? Lasix   4) Are you currently SOB? Yes   5) Do you have a log of your daily weights (if so, list)? Yes, patient will get   6) Have you gained 3 pounds in a day or 5 pounds in a week? Yes   7) Have you traveled recently? No

## 2019-01-11 NOTE — Telephone Encounter (Signed)
Copied from [place] [id number]. Topic: Quick Communication - See Telephone Encounter >> Jan 11, 2019  4:59 PM Rutherford Nail, NT wrote: CRM for notification. See Telephone encounter for: 01/11/19. Shirley with Hospice calling and would like a call today regarding the following:  States patient is very confused and agitated so much that he is pulling out his catheter and nephrostomy tubes. Already on abt for UTI.  For his agitation he is on zyprex 2.5 mg PRN- wife said when he takes it (given 1x since this weekend) that it does help. Would Dr Sarajane Jews consider patient taking it every day or consider haldol PRN? Also having intense itching that he is ripping his colostomy bag off. Is already on vistaril 25 mg every 4 if needed. Tried OTC lotions for itching-not helping. Already on Paxil as well.  Would like to know what Dr Sarajane Jews recommends.   Callback Number: [phone]

## 2019-01-12 ENCOUNTER — Telehealth: Payer: Self-pay

## 2019-01-12 ENCOUNTER — Other Ambulatory Visit: Payer: Medicare HMO | Admitting: Internal Medicine

## 2019-01-12 NOTE — Telephone Encounter (Signed)
Lpm in regards to an OV appt arrangement with Dr Acie Fredrickson 01/23/2019.  Informed them to call Sharyn Lull or me with questions.

## 2019-01-13 ENCOUNTER — Inpatient Hospital Stay: Payer: Medicare HMO

## 2019-01-13 ENCOUNTER — Other Ambulatory Visit: Payer: Self-pay | Admitting: Medical

## 2019-01-13 ENCOUNTER — Other Ambulatory Visit (HOSPITAL_COMMUNITY): Payer: Self-pay | Admitting: Diagnostic Radiology

## 2019-01-13 ENCOUNTER — Ambulatory Visit (HOSPITAL_COMMUNITY)
Admission: RE | Admit: 2019-01-13 | Discharge: 2019-01-13 | Disposition: A | Discharge status: Home / Self Care | Payer: Medicare HMO | Source: Ambulatory Visit | Attending: Diagnostic Radiology | Admitting: Diagnostic Radiology

## 2019-01-13 ENCOUNTER — Encounter (HOSPITAL_COMMUNITY): Payer: Self-pay | Admitting: Diagnostic Radiology

## 2019-01-13 DIAGNOSIS — T83020A Displacement of cystostomy catheter, initial encounter: Secondary | ICD-10-CM | POA: Diagnosis not present

## 2019-01-13 DIAGNOSIS — C786 Secondary malignant neoplasm of retroperitoneum and peritoneum: Secondary | ICD-10-CM | POA: Diagnosis not present

## 2019-01-13 DIAGNOSIS — Z435 Encounter for attention to cystostomy: Secondary | ICD-10-CM | POA: Insufficient documentation

## 2019-01-13 DIAGNOSIS — N183 Chronic kidney disease, stage 3 unspecified: Secondary | ICD-10-CM

## 2019-01-13 DIAGNOSIS — C679 Malignant neoplasm of bladder, unspecified: Secondary | ICD-10-CM

## 2019-01-13 DIAGNOSIS — Z95828 Presence of other vascular implants and grafts: Secondary | ICD-10-CM

## 2019-01-13 DIAGNOSIS — N189 Chronic kidney disease, unspecified: Secondary | ICD-10-CM | POA: Diagnosis not present

## 2019-01-13 DIAGNOSIS — D631 Anemia in chronic kidney disease: Secondary | ICD-10-CM | POA: Diagnosis not present

## 2019-01-13 DIAGNOSIS — I509 Heart failure, unspecified: Secondary | ICD-10-CM | POA: Diagnosis not present

## 2019-01-13 DIAGNOSIS — D691 Qualitative platelet defects: Secondary | ICD-10-CM

## 2019-01-13 HISTORY — PX: IR CATHETER TUBE CHANGE: IMG717

## 2019-01-13 LAB — CBC WITH DIFFERENTIAL (CANCER CENTER ONLY)
Abs Immature Granulocytes: 0.09 10*3/uL — ABNORMAL HIGH (ref 0.00–0.07)
Basophils Absolute: 0 10*3/uL (ref 0.0–0.1)
Basophils Relative: 1 %
Eosinophils Absolute: 0.3 10*3/uL (ref 0.0–0.5)
Eosinophils Relative: 4 %
HEMATOCRIT: 23.5 % — AB (ref 39.0–52.0)
Hemoglobin: 7.7 g/dL — ABNORMAL LOW (ref 13.0–17.0)
Immature Granulocytes: 2 %
Lymphocytes Relative: 29 %
Lymphs Abs: 1.7 10*3/uL (ref 0.7–4.0)
MCH: 32.2 pg (ref 26.0–34.0)
MCHC: 32.8 g/dL (ref 30.0–36.0)
MCV: 98.3 fL (ref 80.0–100.0)
Monocytes Absolute: 0.4 10*3/uL (ref 0.1–1.0)
Monocytes Relative: 7 %
NEUTROS PCT: 57 %
Neutro Abs: 3.4 10*3/uL (ref 1.7–7.7)
Platelet Count: 62 10*3/uL — ABNORMAL LOW (ref 150–400)
RBC: 2.39 MIL/uL — ABNORMAL LOW (ref 4.22–5.81)
RDW: 20.1 % — ABNORMAL HIGH (ref 11.5–15.5)
WBC Count: 5.9 10*3/uL (ref 4.0–10.5)
nRBC: 0 % (ref 0.0–0.2)

## 2019-01-13 LAB — CMP (CANCER CENTER ONLY)
ALT: 16 U/L (ref 0–44)
AST: 17 U/L (ref 15–41)
Albumin: 3.5 g/dL (ref 3.5–5.0)
Alkaline Phosphatase: 77 U/L (ref 38–126)
Anion gap: 11 (ref 5–15)
BILIRUBIN TOTAL: 0.7 mg/dL (ref 0.3–1.2)
BUN: 44 mg/dL — ABNORMAL HIGH (ref 8–23)
CO2: 24 mmol/L (ref 22–32)
Calcium: 9.3 mg/dL (ref 8.9–10.3)
Chloride: 104 mmol/L (ref 98–111)
Creatinine: 1.9 mg/dL — ABNORMAL HIGH (ref 0.61–1.24)
GFR, EST NON AFRICAN AMERICAN: 32 mL/min — AB (ref >60)
GFR, Est AFR Am: 37 mL/min — ABNORMAL LOW (ref >60)
Glucose, Bld: 157 mg/dL — ABNORMAL HIGH (ref 70–99)
Potassium: 3.7 mmol/L (ref 3.5–5.1)
Sodium: 139 mmol/L (ref 135–145)
TOTAL PROTEIN: 7.4 g/dL (ref 6.5–8.1)

## 2019-01-13 MED ORDER — HEPARIN SOD (PORK) LOCK FLUSH 100 UNIT/ML IV SOLN
250.0000 [IU] | Freq: Once | INTRAVENOUS | Status: AC
  Administered 2019-01-13: 250 [IU]
  Filled 2019-01-13: qty 5

## 2019-01-13 MED ORDER — IOPAMIDOL (ISOVUE-300) INJECTION 61%
INTRAVENOUS | Status: AC
  Filled 2019-01-13: qty 50

## 2019-01-13 MED ORDER — SODIUM CHLORIDE 0.9% FLUSH
10.0000 mL | Freq: Once | INTRAVENOUS | Status: AC
  Administered 2019-01-13: 10 mL
  Filled 2019-01-13: qty 10

## 2019-01-13 NOTE — Procedures (Signed)
Interventional Radiology Procedure:   Indications: Suprapubic tube was recently dislodged and replaced by family.  Need to check position and exchange.  Procedure: SP tube change  Findings: New 16 fr tube in bladder  Complications: None     EBL: None  Plan: Routine SP tube and nephroureteral catheter change in future.     Kahiau Schewe R. Anselm Pancoast, MD  Pager: [phone]

## 2019-01-16 ENCOUNTER — Other Ambulatory Visit (HOSPITAL_COMMUNITY): Payer: Self-pay | Admitting: Interventional Radiology

## 2019-01-16 DIAGNOSIS — C662 Malignant neoplasm of left ureter: Secondary | ICD-10-CM

## 2019-01-16 DIAGNOSIS — N1330 Unspecified hydronephrosis: Secondary | ICD-10-CM

## 2019-01-17 ENCOUNTER — Encounter (HOSPITAL_COMMUNITY): Payer: Self-pay | Admitting: Interventional Radiology

## 2019-01-17 ENCOUNTER — Ambulatory Visit (HOSPITAL_COMMUNITY)
Admission: RE | Admit: 2019-01-17 | Discharge: 2019-01-17 | Disposition: A | Discharge status: Home / Self Care | Payer: Medicare HMO | Source: Ambulatory Visit | Attending: Interventional Radiology | Admitting: Interventional Radiology

## 2019-01-17 DIAGNOSIS — N1330 Unspecified hydronephrosis: Secondary | ICD-10-CM | POA: Diagnosis not present

## 2019-01-17 DIAGNOSIS — T83193A Other mechanical complication of other urinary stent, initial encounter: Secondary | ICD-10-CM | POA: Diagnosis not present

## 2019-01-17 DIAGNOSIS — Z436 Encounter for attention to other artificial openings of urinary tract: Secondary | ICD-10-CM | POA: Insufficient documentation

## 2019-01-17 DIAGNOSIS — C662 Malignant neoplasm of left ureter: Secondary | ICD-10-CM | POA: Diagnosis not present

## 2019-01-17 HISTORY — PX: IR EXT NEPHROURETERAL CATH EXCHANGE: IMG5418

## 2019-01-17 MED ORDER — IOPAMIDOL (ISOVUE-300) INJECTION 61%
INTRAVENOUS | Status: AC
  Administered 2019-01-17: 5 mL
  Filled 2019-01-17: qty 50

## 2019-01-17 MED ORDER — IOPAMIDOL (ISOVUE-300) INJECTION 61%
50.0000 mL | Freq: Once | INTRAVENOUS | Status: AC | PRN
  Administered 2019-01-17: 5 mL

## 2019-01-19 ENCOUNTER — Other Ambulatory Visit: Payer: Self-pay | Admitting: Oncology

## 2019-01-20 ENCOUNTER — Inpatient Hospital Stay (HOSPITAL_BASED_OUTPATIENT_CLINIC_OR_DEPARTMENT_OTHER): Payer: Medicare HMO | Admitting: Medical

## 2019-01-20 ENCOUNTER — Inpatient Hospital Stay: Payer: Medicare HMO | Admitting: Lab

## 2019-01-20 ENCOUNTER — Other Ambulatory Visit: Payer: Medicare HMO | Admitting: Internal Medicine

## 2019-01-20 ENCOUNTER — Other Ambulatory Visit: Payer: Self-pay | Admitting: Medical

## 2019-01-20 VITALS — BP 99/70 | HR 78 | Temp 97.7°F | Resp 18 | Ht 68.0 in | Wt 186.0 lb

## 2019-01-20 DIAGNOSIS — R410 Disorientation, unspecified: Secondary | ICD-10-CM

## 2019-01-20 DIAGNOSIS — R600 Localized edema: Secondary | ICD-10-CM

## 2019-01-20 DIAGNOSIS — G893 Neoplasm related pain (acute) (chronic): Secondary | ICD-10-CM

## 2019-01-20 DIAGNOSIS — R0602 Shortness of breath: Secondary | ICD-10-CM

## 2019-01-20 DIAGNOSIS — M545 Low back pain, unspecified: Secondary | ICD-10-CM

## 2019-01-20 DIAGNOSIS — Z95828 Presence of other vascular implants and grafts: Secondary | ICD-10-CM | POA: Diagnosis not present

## 2019-01-20 DIAGNOSIS — G8929 Other chronic pain: Secondary | ICD-10-CM

## 2019-01-20 DIAGNOSIS — C679 Malignant neoplasm of bladder, unspecified: Secondary | ICD-10-CM

## 2019-01-20 DIAGNOSIS — Z515 Encounter for palliative care: Secondary | ICD-10-CM

## 2019-01-20 DIAGNOSIS — D649 Anemia, unspecified: Secondary | ICD-10-CM

## 2019-01-20 DIAGNOSIS — Z936 Other artificial openings of urinary tract status: Secondary | ICD-10-CM

## 2019-01-20 DIAGNOSIS — N189 Chronic kidney disease, unspecified: Secondary | ICD-10-CM | POA: Diagnosis not present

## 2019-01-20 DIAGNOSIS — C786 Secondary malignant neoplasm of retroperitoneum and peritoneum: Secondary | ICD-10-CM | POA: Diagnosis not present

## 2019-01-20 DIAGNOSIS — I509 Heart failure, unspecified: Secondary | ICD-10-CM | POA: Diagnosis not present

## 2019-01-20 DIAGNOSIS — D631 Anemia in chronic kidney disease: Secondary | ICD-10-CM | POA: Diagnosis not present

## 2019-01-20 LAB — CMP (CANCER CENTER ONLY)
ALBUMIN: 3.6 g/dL (ref 3.5–5.0)
ALT: 15 U/L (ref 0–44)
AST: 21 U/L (ref 15–41)
Alkaline Phosphatase: 75 U/L (ref 38–126)
Anion gap: 13 (ref 5–15)
BUN: 48 mg/dL — ABNORMAL HIGH (ref 8–23)
CO2: 23 mmol/L (ref 22–32)
Calcium: 9.4 mg/dL (ref 8.9–10.3)
Chloride: 100 mmol/L (ref 98–111)
Creatinine: 2.41 mg/dL — ABNORMAL HIGH (ref 0.61–1.24)
GFR, Est AFR Am: 28 mL/min — ABNORMAL LOW (ref >60)
GFR, Estimated: 24 mL/min — ABNORMAL LOW (ref >60)
Glucose, Bld: 103 mg/dL — ABNORMAL HIGH (ref 70–99)
POTASSIUM: 3.8 mmol/L (ref 3.5–5.1)
Sodium: 136 mmol/L (ref 135–145)
Total Bilirubin: 0.9 mg/dL (ref 0.3–1.2)
Total Protein: 7.4 g/dL (ref 6.5–8.1)

## 2019-01-20 LAB — CBC WITH DIFFERENTIAL (CANCER CENTER ONLY)
Abs Immature Granulocytes: 0.07 10*3/uL (ref 0.00–0.07)
Basophils Absolute: 0 10*3/uL (ref 0.0–0.1)
Basophils Relative: 1 %
Eosinophils Absolute: 0.1 10*3/uL (ref 0.0–0.5)
Eosinophils Relative: 3 %
HCT: 22.4 % — ABNORMAL LOW (ref 39.0–52.0)
HEMOGLOBIN: 7.4 g/dL — AB (ref 13.0–17.0)
Immature Granulocytes: 1 %
Lymphocytes Relative: 26 %
Lymphs Abs: 1.3 10*3/uL (ref 0.7–4.0)
MCH: 31.9 pg (ref 26.0–34.0)
MCHC: 33 g/dL (ref 30.0–36.0)
MCV: 96.6 fL (ref 80.0–100.0)
Monocytes Absolute: 0.5 10*3/uL (ref 0.1–1.0)
Monocytes Relative: 9 %
NEUTROS PCT: 60 %
Neutro Abs: 3.2 10*3/uL (ref 1.7–7.7)
Platelet Count: 63 10*3/uL — ABNORMAL LOW (ref 150–400)
RBC: 2.32 MIL/uL — ABNORMAL LOW (ref 4.22–5.81)
RDW: 19.9 % — ABNORMAL HIGH (ref 11.5–15.5)
WBC Count: 5.2 10*3/uL (ref 4.0–10.5)
nRBC: 0 % (ref 0.0–0.2)

## 2019-01-20 LAB — PREPARE RBC (CROSSMATCH)

## 2019-01-20 MED ORDER — LORAZEPAM 1 MG PO TABS
ORAL_TABLET | ORAL | Status: AC
  Filled 2019-01-20: qty 1

## 2019-01-20 MED ORDER — OXYCODONE-ACETAMINOPHEN 10-325 MG PO TABS: 1.0000 | ORAL_TABLET | ORAL | 0 refills | Status: DC | PRN

## 2019-01-20 MED ORDER — SODIUM CHLORIDE 0.9 % IV SOLN
Freq: Once | INTRAVENOUS | Status: AC
  Administered 2019-01-20: 14:00:00 via INTRAVENOUS
  Filled 2019-01-20: qty 250

## 2019-01-20 MED ORDER — PEGFILGRASTIM INJECTION 6 MG/0.6ML ~~LOC~~: 6.0000 mg | PREFILLED_SYRINGE | Freq: Once | SUBCUTANEOUS | Status: DC

## 2019-01-20 MED ORDER — ACETAMINOPHEN 325 MG PO TABS
ORAL_TABLET | ORAL | Status: AC
  Filled 2019-01-20: qty 2

## 2019-01-20 MED ORDER — HEPARIN SOD (PORK) LOCK FLUSH 100 UNIT/ML IV SOLN
500.0000 [IU] | Freq: Once | INTRAVENOUS | Status: AC
  Administered 2019-01-20: 500 [IU]
  Filled 2019-01-20: qty 5

## 2019-01-20 MED ORDER — HYDROXYZINE HCL 25 MG PO TABS: 25.0000 mg | ORAL_TABLET | ORAL | 2 refills | Status: DC | PRN

## 2019-01-20 MED ORDER — LORAZEPAM 1 MG PO TABS
0.5000 mg | ORAL_TABLET | Freq: Once | ORAL | Status: AC
  Administered 2019-01-20: 0.5 mg via ORAL

## 2019-01-20 MED ORDER — TRAMADOL HCL 50 MG PO TABS: 50.0000 mg | ORAL_TABLET | Freq: Four times a day (QID) | ORAL | 0 refills | Status: DC | PRN

## 2019-01-20 MED ORDER — SODIUM CHLORIDE 0.9% FLUSH
10.0000 mL | Freq: Once | INTRAVENOUS | Status: AC
  Administered 2019-01-20: 10 mL
  Filled 2019-01-20: qty 10

## 2019-01-20 MED ORDER — ACETAMINOPHEN 325 MG PO TABS
650.0000 mg | ORAL_TABLET | Freq: Once | ORAL | Status: AC
  Administered 2019-01-20: 650 mg via ORAL

## 2019-01-20 NOTE — Progress Notes (Signed)
[facility] Telephone: [phone] Fax: [phone]  PATIENT NAME: Charles Coffey DOB: [birth year] MRN: [id number]  PRIMARY CARE PROVIDER:   [name], MD  REFERRING PROVIDER:  [name], MD [place], [place] [address]  RESPONSIBLE PARTY:   Self and wife      RECOMMENDATIONS and PLAN:  1.Palliative care encounter Z51.5:   - Dyspnea:  Intermittent apnea while sleeping followed by increased respirations and air hunger.  O2 sats fluctuated between 92-99% during apnea. Contributing factors are anemia, CHF and nearing EOL.   Consider home O2 for prn and comfort use.  Continue diuretics daily.  -Weakness:  Progressive decline.  Related to decreased nutritional intact, anemia, CKD, terminal cancer and insufficient sleep. Possible urinary infection.  Plans for appt with Charles Bowens, PA today for evaluation of CBC, CMP, UA and transfusion of RBCs if indicated. Continue to monitor symptoms. Supportive care.  -Advanced care planning:  Pt appears to be nearing end of life with additional noted decline of health as reflected on assessments.  He was not able to express his desires today other than he did not feel well and had considered going to the hospital.  Wife agrees that a goal would be to avoid hospital visit today and urgent needs can be met at Oncology clinic today.  Assisted wife in rescheduling F/U appt with Dr. [name] for 01/26/19 '@1pm'$  to discuss future planning of urological needs.  (recurrent nephrostomy tube replacements vs stent placement). Wife still expresses the desire to eventually transition to comfort care with Hospice but would like to have pending appt with Cardiologist on 1/27 and Urologist on 1/30. F/U with wife next week via phone for updates.   I spent 90 minutes providing this consultation,  from 10:30am to 12:00pm at home. More than 50% of the time in this consultation was speent coordinating communication with patient, wife Charles Coffey, [place] and [facility] urology nurse.   HISTORY OF PRESENT ILLNESS:  Follow-up with Charles Coffey. Wife reports patient has been complaining of difficulty with breathing since during the night and this AM along with nausea and light headedness. Wife reports increased confusion during evening hours. His appetite has decreased quite a bit.  He continues to have night time insomnia even with use of nightly Zyprexa 12.5-'25mg'$ .    CODE STATUS: DNAR/DNI  PPS: 40% with sips and bites HOSPICE ELIGIBILITY/DIAGNOSIS: TBD   PAST MEDICAL HISTORY:  Past Medical History:  Diagnosis Date  . Aortic atherosclerosis ([place])   . BPH with urinary obstruction   . CAD (coronary artery disease)    a.  MI 1995, CABG x 3 2002 (patient says that he had LIMA and RIMA grafts). b. ETT-Cardiolite (10/15) with EF 48%, apical scar, no ischemia. c. Nuc 07/2017 abnormal -> cath was performed,  patent LIMA to LAD and SVG to OM 2. RIMA to RCA is atretic. The right coronary artery is occluded distally with extensive collaterals from the LAD, medical therapy.   . Cancer [facility])    bladder cancer  . Cervical spondylosis without myelopathy 12/13/202017  . Chronic low back pain    sees Dr. Kary Kos   . GERD (gastroesophageal reflux disease)   . Hiatal hernia   . Hyperlipidemia   . Hypertension   . IBS (irritable bowel syndrome)   . Ischemic cardiomyopathy   . Myocardial infarction ([place])   . RBBB   . Restless legs syndrome   . Statin intolerance   . Syncope  a. in 2015 - no apparent cause, was taking sleep medicine at the time. Cardiac workup unremarkable.  . Tubular adenoma of colon     PHYSICAL EXAM:   General: fatigued, frail and chronically ill appearing Cardiovascular: irregular rhythm with regular rate.  Systolic murmur Pulmonary: Episodic apnea while sleeping. clear lung fields Abdomen: soft,active bs.  Suprapubic tenderness. Colostomy and suprapubic catheter are patent.   GU: Bilat nephrostomy tubes  present Extremities: 3+ pitting edema BLE   Skin:  petechiae distal lower legsn:  Neurological: Generalized weakness.  Drifts off to sleep easily while sitting on sofa. Oriented x2  Gonzella Lex, NP-C

## 2019-01-20 NOTE — Patient Instructions (Signed)

## 2019-01-21 LAB — TYPE AND SCREEN
ABO/RH(D): A POS
ANTIBODY SCREEN: NEGATIVE
Unit division: 0

## 2019-01-21 LAB — BPAM RBC
Blood Product Expiration Date: 202002202359
ISSUE DATE / TIME: 202001241528
Unit Type and Rh: 6200

## 2019-01-23 ENCOUNTER — Encounter: Payer: Self-pay | Admitting: Cardiovascular Disease

## 2019-01-23 ENCOUNTER — Ambulatory Visit: Payer: Medicare HMO | Admitting: Cardiovascular Disease

## 2019-01-23 VITALS — BP 112/64 | HR 116 | Ht 68.0 in | Wt 190.0 lb

## 2019-01-23 DIAGNOSIS — I255 Ischemic cardiomyopathy: Secondary | ICD-10-CM | POA: Diagnosis not present

## 2019-01-23 DIAGNOSIS — I10 Essential (primary) hypertension: Secondary | ICD-10-CM | POA: Diagnosis not present

## 2019-01-23 NOTE — Patient Instructions (Signed)

## 2019-01-23 NOTE — Progress Notes (Signed)
Cardiology Office Note:    Date:  01/23/2019   ID:  Charles Coffey, DOB 03-Jul-1935, MRN [id number]  PCP:  [name], MD  Cardiologist:  Mertie Moores, MD  Electrophysiologist:  None   Referring MD: [name], MD   Chief Complaint  Patient presents with  . Coronary Artery Disease     Jan. 27, 2020   Charles Coffey is a 84 y.o. male with a hx of  CAD  Its friend care from Dr. and today. He has a history of coronary artery bypass grafting in 2002.  He has an ischemic cardiomyopathy, hypertension, hyperlipidemia.  He is been diagnosed with bladder cancer. His health is been declined recently and he has been seen by palliative care.   Has had progressive issues with CHF - Echo in Aug. 2019 - showed EF 25-30%. EF was 505 by echo in Sept. 2018.  Cath in Aug. 2018.   Has severe native CAD Patent LIMA to LAD and patent SVG to OM2.    Was hospitalized for urosepsis in November, 2019.  Is falling asleep suddenly  ( narcolepsy like  Symptoms ) for the past 2 months .  Does not sleep at night .       Past Medical History:  Diagnosis Date  . Aortic atherosclerosis ([place])   . BPH with urinary obstruction   . CAD (coronary artery disease)    a.  MI 1995, CABG x 3 2002 (patient says that he had LIMA and RIMA grafts). b. ETT-Cardiolite (10/15) with EF 48%, apical scar, no ischemia. c. Nuc 07/2017 abnormal -> cath was performed,  patent LIMA to LAD and SVG to OM 2. RIMA to RCA is atretic. The right coronary artery is occluded distally with extensive collaterals from the LAD, medical therapy.   . Cancer [facility])    bladder cancer  . Cervical spondylosis without myelopathy Jun 16, 202017  . Chronic low back pain    sees Dr. Kary Kos   . GERD (gastroesophageal reflux disease)   . Hiatal hernia   . Hyperlipidemia   . Hypertension   . IBS (irritable bowel syndrome)   . Ischemic cardiomyopathy   . Myocardial infarction ([place])   . RBBB   . Restless legs syndrome   . Statin  intolerance   . Syncope    a. in 2015 - no apparent cause, was taking sleep medicine at the time. Cardiac workup unremarkable.  . Tubular adenoma of colon     Past Surgical History:  Procedure Laterality Date  . COLONOSCOPY  10/17/2014   per Dr. Hilarie Fredrickson, tubular adenomas, repeat in 3 yrs   . COLONOSCOPY WITH PROPOFOL N/A 12/01/2017   Procedure: COLONOSCOPY WITH PROPOFOL;  Surgeon: Milus Banister, MD;  Location: [facility];  Service: Endoscopy;  Laterality: N/A;  . CYSTOSCOPY W/ RETROGRADES Left 11/25/2017   Procedure: CYSTOSCOPY WITH RETROGRADE PYELOGRAM;  Surgeon: Irine Seal, MD;  Location: [facility];  Service: Urology;  Laterality: Left;  . HEART BYPASS    . IR CATHETER TUBE CHANGE  11/08/2018  . IR CATHETER TUBE CHANGE  12/26/2018  . IR CATHETER TUBE CHANGE  01/08/2019  . IR CATHETER TUBE CHANGE  01/13/2019  . IR CONVERT LEFT NEPHROSTOMY TO NEPHROURETERAL CATH  09/20/2018  . IR CONVERT RIGHT NEPHROSTOMY TO NEPHROURETERAL CATH  02/04/2018  . IR CONVERT RIGHT NEPHROSTOMY TO NEPHROURETERAL CATH  08/23/2018  . IR EXT NEPHROURETERAL CATH EXCHANGE  04/11/2018  . IR EXT NEPHROURETERAL CATH EXCHANGE  07/04/2018  .  IR EXT NEPHROURETERAL CATH EXCHANGE  09/20/2018  . IR EXT NEPHROURETERAL CATH EXCHANGE  11/08/2018  . IR EXT NEPHROURETERAL CATH EXCHANGE  11/08/2018  . IR EXT NEPHROURETERAL CATH EXCHANGE  12/26/2018  . IR EXT NEPHROURETERAL CATH EXCHANGE  12/26/2018  . IR EXT NEPHROURETERAL CATH EXCHANGE  01/17/2019  . IR FLUORO GUIDE CV LINE RIGHT  12/27/2017  . IR FLUORO GUIDED NEEDLE PLC ASPIRATION/INJECTION LOC  08/19/2018  . IR NEPHROSTOGRAM LEFT THRU EXISTING ACCESS  12/24/2017  . IR NEPHROSTOMY EXCHANGE LEFT  01/28/2018  . IR NEPHROSTOMY EXCHANGE LEFT  02/04/2018  . IR NEPHROSTOMY EXCHANGE LEFT  02/25/2018  . IR NEPHROSTOMY EXCHANGE LEFT  07/04/2018  . IR NEPHROSTOMY EXCHANGE LEFT  07/05/2018  . IR NEPHROSTOMY EXCHANGE LEFT  08/09/2018  . IR NEPHROSTOMY EXCHANGE LEFT  08/16/2018  . IR NEPHROSTOMY  EXCHANGE LEFT  08/23/2018  . IR NEPHROSTOMY EXCHANGE RIGHT  12/24/2017  . IR NEPHROSTOMY EXCHANGE RIGHT  01/28/2018  . IR NEPHROSTOMY EXCHANGE RIGHT  04/11/2018  . IR NEPHROSTOMY EXCHANGE RIGHT  07/05/2018  . IR NEPHROSTOMY EXCHANGE RIGHT  08/09/2018  . IR NEPHROSTOMY EXCHANGE RIGHT  08/19/2018  . IR NEPHROSTOMY PLACEMENT LEFT  11/28/2017  . IR NEPHROSTOMY PLACEMENT RIGHT  12/03/2017  . IR PATIENT EVAL TECH 0-60 MINS  03/24/2018  . IR PATIENT EVAL TECH 0-60 MINS  03/28/2018  . IR PATIENT EVAL TECH 0-60 MINS  04/25/2018  . IR PATIENT EVAL TECH 0-60 MINS  06/20/2018  . IR US GUIDE VASC ACCESS RIGHT  12/27/2017  . LAPAROSCOPY N/A 12/01/2017   Procedure: LAPAROSCOPIC DIVERTING OSTOMY;  Surgeon: Stark Klein, MD;  Location: [facility];  Service: General;  Laterality: N/A;  . LEFT HEART CATH AND CORS/GRAFTS ANGIOGRAPHY N/A 08/25/2017   Procedure: LEFT HEART CATH AND CORS/GRAFTS ANGIOGRAPHY;  Surgeon: Wellington Hampshire, MD;  Location: [facility];  Service: Cardiovascular;  Laterality: N/A;  . LUMBAR FUSION  2003   L3-L4  . PROSTATE SURGERY  06-27-12   per Dr. Roni Bread, had CTT  . TONSILLECTOMY    . TRANSURETHRAL RESECTION OF BLADDER TUMOR N/A 11/25/2017   Procedure: TRANSURETHRAL RESECTION OF BLADDER TUMOR (TURBT);  Surgeon: Irine Seal, MD;  Location: [facility];  Service: Urology;  Laterality: N/A;    Current Medications: Current Meds  Medication Sig  . carvedilol (COREG) 3.125 MG tablet Take 3.125 mg by mouth 2 (two) times daily with a meal.  . Famotidine (PEPCID PO) Two tablets by mouth daily  . feeding supplement (BOOST HIGH PROTEIN) LIQD Take 1 Container by mouth 2 (two) times daily between meals.  . furosemide (LASIX) 40 MG tablet Take 40 mg by mouth 3 (three) times daily.   Marland Kitchen lidocaine-prilocaine (EMLA) cream Apply 1 application topically as needed.  . mirabegron ER (MYRBETRIQ) 25 MG TB24 tablet Take 25 mg by mouth daily.  Marland Kitchen OLANZapine (ZYPREXA) 5 MG tablet Take 1 tablet (5 mg total) by mouth daily.  Between 5-6 pm  . oxyCODONE (OXYCONTIN) 10 mg 12 hr tablet Take 10 mg by mouth every 12 (twelve) hours as needed (pain).  Marland Kitchen oxyCODONE-acetaminophen (PERCOCET) 10-325 MG tablet Take 1 tablet by mouth every 4 (four) hours as needed for pain.  Marland Kitchen PARoxetine (PAXIL) 10 MG tablet Take 1 tablet (10 mg total) by mouth daily.  . traMADol (ULTRAM) 50 MG tablet Take 1 tablet (50 mg total) by mouth every 6 (six) hours as needed.  . triamcinolone cream (KENALOG) 0.1 % Apply topically 2 (two) times daily as needed. To  the ostomy site for itching     Allergies:   Statins and Cyproheptadine   Social History   Socioeconomic History  . Marital status: Married    Spouse name: Not on file  . Number of children: 5  . Years of education: Some coll  . Highest education level: Not on file  Occupational History  . Occupation: retired  Scientific laboratory technician  . Financial resource strain: Not on file  . Food insecurity:    Worry: Not on file    Inability: Not on file  . Transportation needs:    Medical: Not on file    Non-medical: Not on file  Tobacco Use  . Smoking status: Former Smoker    Types: Cigarettes    Last attempt to quit: 12/28/1978    Years since quitting: 40.0  . Smokeless tobacco: Never Used  Substance and Sexual Activity  . Alcohol use: Not Currently    Alcohol/week: 2.0 standard drinks    Types: 1 Glasses of wine, 1 Cans of beer per week    Comment: occassional  . Drug use: No  . Sexual activity: Not Currently  Lifestyle  . Physical activity:    Days per week: Not on file    Minutes per session: Not on file  . Stress: Not on file  Relationships  . Social connections:    Talks on phone: Not on file    Gets together: Not on file    Attends religious service: Not on file    Active member of club or organization: Not on file    Attends meetings of clubs or organizations: Not on file    Relationship status: Not on file  Other Topics Concern  . Not on file  Social History Narrative    Lives at home w/ his wife   Right-handed   5 cups of coffee per day     Family History: The patient's family history includes Aneurysm in his father; Heart attack in his mother; Heart disease in his brother and maternal uncle. There is no history of Colon cancer, Esophageal cancer, Pancreatic cancer, Kidney disease, or Liver disease.  ROS:   Please see the history of present illness.     All other systems reviewed and are negative.  EKGs/Labs/Other Studies Reviewed:    The following studies were reviewed today:     Recent Labs: 08/12/2018: TSH 1.601 11/25/2018: B Natriuretic Peptide 2,117.1 11/26/2018: Magnesium 2.2 01/20/2019: ALT 15; BUN 48; Creatinine 2.41; Hemoglobin 7.4; Platelet Count 63; Potassium 3.8; Sodium 136  Recent Lipid Panel    Component Value Date/Time   CHOL 122 09/21/2017 1521   TRIG 79 09/21/2017 1521   HDL 41 09/21/2017 1521   CHOLHDL 3.0 09/21/2017 1521   CHOLHDL 3 12/01/2016 0827   VLDL 17.0 12/01/2016 0827   LDLCALC 65 09/21/2017 1521   LDLDIRECT 72 09/21/2017 1521    Physical Exam:    VS:  BP 112/64   Pulse (!) 116   Ht 5\' 8"  (1.727 m)   Wt 190 lb (86.2 kg)   SpO2 98%   BMI 28.89 kg/m     Wt Readings from Last 3 Encounters:  01/23/19 190 lb (86.2 kg)  01/20/19 186 lb (84.4 kg)  01/08/19 178 lb (80.7 kg)     GEN:  Irreg. Irreg.  HEENT: Normal NECK: No JVD; No carotid bruits LYMPHATICS: No lymphadenopathy CARDIAC:  Irreg. Irreg.  RESPIRATORY:  Clear to auscultation without rales, wheezing or rhonchi  ABDOMEN: Soft, non-tender, non-distended MUSCULOSKELETAL:  Compression hose, 1-2 + edema  SKIN: Warm and dry NEUROLOGIC:  Alert and oriented x 3 PSYCHIATRIC:  Normal affect    EKG:   11/29/2018:     NSR ,  1st degree AV block .   RBBB .   ASSESSMENT:    No diagnosis found. PLAN:    In order of problems listed above:  1. CAD :   No angina.      2. Chronic systolic CHF:  Has developed systolic CHF over the past year or so.  His  overall health has continued to decline.  I think most of his decline in health is related to sleep apnea.  He fell asleep multiple times during my interview with him today. I think that greatly benefit from a sleep study.  3.  Possible sleep apnea:   He has severe daytime sleepiness.  I have recommended that he contact his medical doctor for a sleep study.  3.  Bladder cancer:  Has been seen by palliative care .       Medication Adjustments/Labs and Tests Ordered: Current medicines are reviewed at length with the patient today.  Concerns regarding medicines are outlined above.  No orders of the defined types were placed in this encounter.  No orders of the defined types were placed in this encounter.   Patient Instructions  Medication Instructions:  Your physician recommends that you continue on your current medications as directed. Please refer to the Current Medication list given to you today.  If you need a refill on your cardiac medications before your next appointment, please call your pharmacy.   Lab work: None Ordered    Testing/Procedures: None Ordered   Follow-Up: At [facility], you and your health needs are our priority.  As part of our continuing mission to provide you with exceptional heart care, we have created designated Provider Care Teams.  These Care Teams include your primary Cardiologist (physician) and Advanced Practice Providers (APPs -  Physician Assistants and Nurse Practitioners) who all work together to provide you with the care you need, when you need it. You will need a follow up appointment in:  6 months.  Please call our office 2 months in advance to schedule this appointment.  You may see Mertie Moores, MD or one of the following Advanced Practice Providers on your designated Care Team: Richardson Dopp, PA-C [place], [place] . Daune Perch, NP      Signed, Mertie Moores, MD  01/23/2019 12:46 PM    [place]

## 2019-01-25 DIAGNOSIS — K624 Stenosis of anus and rectum: Secondary | ICD-10-CM | POA: Diagnosis not present

## 2019-01-25 DIAGNOSIS — Z933 Colostomy status: Secondary | ICD-10-CM | POA: Diagnosis not present

## 2019-01-25 NOTE — Progress Notes (Signed)
[facility] Progress Note   Charles Coffey [id number] 04-Jul-1935 84 y.o.  Charles Coffey is managed by Dr. [name]  Actively treated with chemotherapy/immunotherapy/hormonal therapy: no  Assessment: Plan:    Anemia, unspecified type - Plan: acetaminophen (TYLENOL) tablet 650 mg, Care order/instruction, Complete patient signature process for consent form, Practitioner attestation of consent, 0.9 %  sodium chloride infusion, Transfuse RBC, Transfuse RBC, LORazepam (ATIVAN) tablet 0.5 mg  Port-A-Cath in place - Plan: SCHEDULING COMMUNICATION INJECTION, heparin lock flush 100 unit/mL, sodium chloride flush (NS) 0.9 % injection 10 mL, DISCONTINUED: pegfilgrastim (NEULASTA) injection 6 mg  Urothelial carcinoma of bladder ([place]) - Plan: traMADol (ULTRAM) 50 MG tablet, SCHEDULING COMMUNICATION INJECTION, heparin lock flush 100 unit/mL, sodium chloride flush (NS) 0.9 % injection 10 mL, DISCONTINUED: pegfilgrastim (NEULASTA) injection 6 mg   Anemia: A CBC was completed which returned showing a hemoglobin of 7.4 and a hematocrit of 24.4.  The patient was transfused with 1 unit of packed red blood cells.  Urothelial carcinoma of the bladder: The patient and his wife are considering transitioning from palliative care to hospice but did not yet make this decision.  They are awaiting to be seen by urology with hopes that they can internalize his nephrostomy tubes prior to electing to transition to hospice.  Please see After Visit Summary for patient specific instructions.  Future Appointments  Date Time Provider [place]  03/17/2019  9:15 AM [facility] LAB 5 [facility] None  03/17/2019  9:30 AM [facility] FLUSH [facility] None  03/17/2019 10:00 AM Wyatt Portela, MD [facility] None  03/20/2019  1:00 PM [facility] ROOM [facility] None  03/20/2019  2:30 PM [facility] 1 [facility] [place]  07/11/2019  3:30 PM Cameron Sprang, MD [facility] None    Orders Placed This Encounter    Procedures  . Care order/instruction  . Complete patient signature process for consent form  . Practitioner attestation of consent  . SCHEDULING COMMUNICATION INJECTION       Subjective:   Patient ID:  Charles Coffey is a 84 y.o. (DOB [birth year]) male.  Chief Complaint:  Chief Complaint  Patient presents with  . Anemia    HPI Charles Coffey is an 84 year old male with a history of a metastatic urothelial carcinoma.  He also has a history of congestive heart failure.  He presents to the clinic today with increased shortness of breath.  A CBC was completed which showed a hemoglobin of 7.4 and a hematocrit of 22.4.  The patient and his wife are considering transitioning from palliative care to hospice but did not yet make this decision.  They are awaiting to be seen by urology with hopes that they can internalize his nephrostomy tubes prior to electing to transition to hospice.  The patient continues to have intermittent confusion especially in the evening.  Medications: I have reviewed the patient's current medications.  Allergies:  Allergies  Allergen Reactions  . Statins Other (See Comments)    liver effects  . Cyproheptadine Other (See Comments)    Unable to urinate    Past Medical History:  Diagnosis Date  . Aortic atherosclerosis ([place])   . BPH with urinary obstruction   . CAD (coronary artery disease)    a.  MI 1995, CABG x 3 2002 (patient says that he had LIMA and RIMA grafts). b. ETT-Cardiolite (10/15) with EF 48%, apical scar, no ischemia. c. Nuc 07/2017 abnormal -> cath was performed,  patent LIMA to LAD and SVG to  OM 2. RIMA to RCA is atretic. The right coronary artery is occluded distally with extensive collaterals from the LAD, medical therapy.   . Cancer [facility])    bladder cancer  . Cervical spondylosis without myelopathy September 03, 202017  . Chronic low back pain    sees Dr. Kary Kos   . GERD (gastroesophageal reflux disease)   . Hiatal hernia   . Hyperlipidemia    . Hypertension   . IBS (irritable bowel syndrome)   . Ischemic cardiomyopathy   . Myocardial infarction ([place])   . RBBB   . Restless legs syndrome   . Statin intolerance   . Syncope    a. in 2015 - no apparent cause, was taking sleep medicine at the time. Cardiac workup unremarkable.  . Tubular adenoma of colon     Past Surgical History:  Procedure Laterality Date  . COLONOSCOPY  10/17/2014   per Dr. Hilarie Fredrickson, tubular adenomas, repeat in 3 yrs   . COLONOSCOPY WITH PROPOFOL N/A 12/01/2017   Procedure: COLONOSCOPY WITH PROPOFOL;  Surgeon: Milus Banister, MD;  [facility];  Service: Endoscopy;  Laterality: N/A;  . CYSTOSCOPY W/ RETROGRADES Left 11/25/2017   Procedure: CYSTOSCOPY WITH RETROGRADE PYELOGRAM;  Surgeon: Irine Seal, MD;  [facility];  Service: Urology;  Laterality: Left;  . HEART BYPASS    . IR CATHETER TUBE CHANGE  11/08/2018  . IR CATHETER TUBE CHANGE  12/26/2018  . IR CATHETER TUBE CHANGE  01/08/2019  . IR CATHETER TUBE CHANGE  01/13/2019  . IR CONVERT LEFT NEPHROSTOMY TO NEPHROURETERAL CATH  09/20/2018  . IR CONVERT RIGHT NEPHROSTOMY TO NEPHROURETERAL CATH  02/04/2018  . IR CONVERT RIGHT NEPHROSTOMY TO NEPHROURETERAL CATH  08/23/2018  . IR EXT NEPHROURETERAL CATH EXCHANGE  04/11/2018  . IR EXT NEPHROURETERAL CATH EXCHANGE  07/04/2018  . IR EXT NEPHROURETERAL CATH EXCHANGE  09/20/2018  . IR EXT NEPHROURETERAL CATH EXCHANGE  11/08/2018  . IR EXT NEPHROURETERAL CATH EXCHANGE  11/08/2018  . IR EXT NEPHROURETERAL CATH EXCHANGE  12/26/2018  . IR EXT NEPHROURETERAL CATH EXCHANGE  12/26/2018  . IR EXT NEPHROURETERAL CATH EXCHANGE  01/17/2019  . IR FLUORO GUIDE CV LINE RIGHT  12/27/2017  . IR FLUORO GUIDED NEEDLE PLC ASPIRATION/INJECTION LOC  08/19/2018  . IR NEPHROSTOGRAM LEFT THRU EXISTING ACCESS  12/24/2017  . IR NEPHROSTOMY EXCHANGE LEFT  01/28/2018  . IR NEPHROSTOMY EXCHANGE LEFT  02/04/2018  . IR NEPHROSTOMY EXCHANGE LEFT  02/25/2018  . IR NEPHROSTOMY EXCHANGE LEFT   07/04/2018  . IR NEPHROSTOMY EXCHANGE LEFT  07/05/2018  . IR NEPHROSTOMY EXCHANGE LEFT  08/09/2018  . IR NEPHROSTOMY EXCHANGE LEFT  08/16/2018  . IR NEPHROSTOMY EXCHANGE LEFT  08/23/2018  . IR NEPHROSTOMY EXCHANGE RIGHT  12/24/2017  . IR NEPHROSTOMY EXCHANGE RIGHT  01/28/2018  . IR NEPHROSTOMY EXCHANGE RIGHT  04/11/2018  . IR NEPHROSTOMY EXCHANGE RIGHT  07/05/2018  . IR NEPHROSTOMY EXCHANGE RIGHT  08/09/2018  . IR NEPHROSTOMY EXCHANGE RIGHT  08/19/2018  . IR NEPHROSTOMY PLACEMENT LEFT  11/28/2017  . IR NEPHROSTOMY PLACEMENT RIGHT  12/03/2017  . IR PATIENT EVAL TECH 0-60 MINS  03/24/2018  . IR PATIENT EVAL TECH 0-60 MINS  03/28/2018  . IR PATIENT EVAL TECH 0-60 MINS  04/25/2018  . IR PATIENT EVAL TECH 0-60 MINS  06/20/2018  . IR US GUIDE VASC ACCESS RIGHT  12/27/2017  . LAPAROSCOPY N/A 12/01/2017   Procedure: LAPAROSCOPIC DIVERTING OSTOMY;  Surgeon: Stark Klein, MD;  [facility];  Service: General;  Laterality:  N/A;  . LEFT HEART CATH AND CORS/GRAFTS ANGIOGRAPHY N/A 08/25/2017   Procedure: LEFT HEART CATH AND CORS/GRAFTS ANGIOGRAPHY;  Surgeon: Wellington Hampshire, MD;  Location: [place] CV LAB;  Service: Cardiovascular;  Laterality: N/A;  . LUMBAR FUSION  2003   L3-L4  . PROSTATE SURGERY  06-27-12   per Dr. Roni Bread, had CTT  . TONSILLECTOMY    . TRANSURETHRAL RESECTION OF BLADDER TUMOR N/A 11/25/2017   Procedure: TRANSURETHRAL RESECTION OF BLADDER TUMOR (TURBT);  Surgeon: Irine Seal, MD;  [facility];  Service: Urology;  Laterality: N/A;    Family History  Problem Relation Age of Onset  . Heart attack Mother   . Aneurysm Father        femoral artery  . Heart disease Brother   . Heart disease Maternal Uncle        x 2  . Colon cancer Neg Hx   . Esophageal cancer Neg Hx   . Pancreatic cancer Neg Hx   . Kidney disease Neg Hx   . Liver disease Neg Hx     Social History   Socioeconomic History  . Marital status: Married    Spouse name: Not on file  . Number of children: 5  . Years of  education: Some coll  . Highest education level: Not on file  Occupational History  . Occupation: retired  Scientific laboratory technician  . Financial resource strain: Not on file  . Food insecurity:    Worry: Not on file    Inability: Not on file  . Transportation needs:    Medical: Not on file    Non-medical: Not on file  Tobacco Use  . Smoking status: Former Smoker    Types: Cigarettes    Last attempt to quit: 12/28/1978    Years since quitting: 40.1  . Smokeless tobacco: Never Used  Substance and Sexual Activity  . Alcohol use: Not Currently    Alcohol/week: 2.0 standard drinks    Types: 1 Glasses of wine, 1 Cans of beer per week    Comment: occassional  . Drug use: No  . Sexual activity: Not Currently  Lifestyle  . Physical activity:    Days per week: Not on file    Minutes per session: Not on file  . Stress: Not on file  Relationships  . Social connections:    Talks on phone: Not on file    Gets together: Not on file    Attends religious service: Not on file    Active member of club or organization: Not on file    Attends meetings of clubs or organizations: Not on file    Relationship status: Not on file  . Intimate partner violence:    Fear of current or ex partner: Not on file    Emotionally abused: Not on file    Physically abused: Not on file    Forced sexual activity: Not on file  Other Topics Concern  . Not on file  Social History Narrative   Lives at home w/ his wife   Right-handed   5 cups of coffee per day    Past Medical History, Surgical history, Social history, and Family history were reviewed and updated as appropriate.   Please see review of systems for further details on the patient's review from today.   Review of Systems:  Review of Systems  Constitutional: Positive for fatigue. Negative for appetite change, chills, diaphoresis and fever.  HENT: Negative for dental problem, mouth sores and trouble  swallowing.   Respiratory: Positive for shortness of  breath. Negative for cough and chest tightness.   Cardiovascular: Negative for chest pain and palpitations.  Gastrointestinal: Negative for constipation, diarrhea, nausea and vomiting.  Neurological: Negative for dizziness, syncope, weakness and headaches.  Psychiatric/Behavioral: Positive for confusion.    Objective:   Physical Exam:  BP 99/70 (BP Location: Right Arm, Patient Position: Sitting)   Pulse 78   Temp 97.7 F (36.5 C) (Oral)   Resp 18   Ht 5\' 8"  (1.727 m)   Wt 186 lb (84.4 kg)   SpO2 100%   BMI 28.28 kg/m  ECOG: 2  Physical Exam Constitutional:      General: He is not in acute distress.    Appearance: He is not diaphoretic.  HENT:     Head: Normocephalic and atraumatic.  Cardiovascular:     Rate and Rhythm: Normal rate and regular rhythm.     Heart sounds: Normal heart sounds. No murmur. No friction rub. No gallop.   Pulmonary:     Effort: Pulmonary effort is normal. No respiratory distress.     Breath sounds: Normal breath sounds. No wheezing or rales.  Musculoskeletal:     Right lower leg: Edema present.     Left lower leg: Edema present.  Skin:    General: Skin is warm and dry.     Findings: No erythema or rash.  Neurological:     Mental Status: He is alert.     Lab Review:     Component Value Date/Time   NA 136 01/20/2019 1252   NA 137 11/14/2018 1656   NA 133 (L) 12/29/2017 1014   K 3.8 01/20/2019 1252   K 4.4 12/29/2017 1014   CL 100 01/20/2019 1252   CO2 23 01/20/2019 1252   CO2 20 (L) 12/29/2017 1014   GLUCOSE 103 (H) 01/20/2019 1252   GLUCOSE 102 12/29/2017 1014   BUN 48 (H) 01/20/2019 1252   BUN 35 (H) 11/14/2018 1656   BUN 31.5 (H) 12/29/2017 1014   CREATININE 2.41 (H) 01/20/2019 1252   CREATININE 2.22 (H) 12/20/2018 1211   CREATININE 1.6 (H) 12/29/2017 1014   CALCIUM 9.4 01/20/2019 1252   CALCIUM 9.6 12/29/2017 1014   PROT 7.4 01/20/2019 1252   PROT 6.8 12/29/2017 1014   ALBUMIN 3.6 01/20/2019 1252   ALBUMIN 3.8 12/29/2017  1014   AST 21 01/20/2019 1252   AST 15 12/29/2017 1014   ALT 15 01/20/2019 1252   ALT 13 12/29/2017 1014   ALKPHOS 75 01/20/2019 1252   ALKPHOS 61 12/29/2017 1014   BILITOT 0.9 01/20/2019 1252   BILITOT 0.43 12/29/2017 1014   GFRNONAA 24 (L) 01/20/2019 1252   GFRAA 28 (L) 01/20/2019 1252       Component Value Date/Time   WBC 5.2 01/20/2019 1252   WBC 5.9 12/26/2018 1217   RBC 2.32 (L) 01/20/2019 1252   HGB 7.4 (L) 01/20/2019 1252   HGB 8.2 (L) 11/14/2018 1656   HGB 8.8 (L) 12/29/2017 1014   HCT 22.4 (L) 01/20/2019 1252   HCT 24.4 (L) 11/14/2018 1656   HCT 26.0 (L) 12/29/2017 1014   PLT 63 (L) 01/20/2019 1252   PLT 64 (LL) 11/14/2018 1656   MCV 96.6 01/20/2019 1252   MCV 96 11/14/2018 1656   MCV 89.3 12/29/2017 1014   MCH 31.9 01/20/2019 1252   MCHC 33.0 01/20/2019 1252   RDW 19.9 (H) 01/20/2019 1252   RDW 18.8 (H) 11/14/2018 1656   RDW 15.5 (  H) 12/29/2017 1014   LYMPHSABS 1.3 01/20/2019 1252   LYMPHSABS 1.4 12/29/2017 1014   MONOABS 0.5 01/20/2019 1252   MONOABS 0.6 12/29/2017 1014   EOSABS 0.1 01/20/2019 1252   EOSABS 0.2 12/29/2017 1014   BASOSABS 0.0 01/20/2019 1252   BASOSABS 0.0 12/29/2017 1014   -------------------------------  Imaging from last 24 hours (if applicable):  Radiology interpretation: Ir Catheter Tube Change  Result Date: 01/13/2019 INDICATION: 84 year old with history of bladder cancer and suprapubic catheter. Suprapubic catheter was dislodged last night and the family replaced the catheter at home. Plan for catheter check and exchange. EXAM: SUPRAPUBIC CATHETER EXCHANGE WITH FLUOROSCOPY COMPARISON:  None. MEDICATIONS: Patient is on oral antibiotics. ANESTHESIA/SEDATION: None CONTRAST:  5 mL Isovue-300-administered into the collecting system(s) FLUOROSCOPY TIME:  Fluoroscopy Time: 6 seconds COMPLICATIONS: None immediate. PROCEDURE: Informed written consent was obtained from the patient after a thorough discussion of the procedural risks, benefits  and alternatives. All questions were addressed. Maximal Sterile Barrier Technique was utilized including caps, mask, sterile gowns, sterile gloves, sterile drape, hand hygiene and skin antiseptic. A timeout was performed prior to the initiation of the procedure. The old catheter balloon was deflated. New 16 French Entuit catheter was easily advanced into the bladder. Balloon was inflated with 5 mL of saline. Contrast injection confirmed placement in the bladder. FINDINGS: Suprapubic catheter is well positioned in the bladder. Bilateral nephroureteral catheters are present. IMPRESSION: Successful exchange of the suprapubic catheter. Electronically Signed   By: Markus Daft M.D.   On: 01/13/2019 13:54   Ir Catheter Tube Change  Result Date: 01/08/2019 INDICATION: 84 year old male with a history of bladder outlet obstruction. He has bilateral percutaneous nephroureteral tubes and a suprapubic catheter. Unfortunately, the suprapubic catheter was displaced last night and he now has urinary retention and requires urgent replacement. EXAM: Suprapubic catheter replacement COMPARISON:  Prior exam 12/26/2018 MEDICATIONS: Viscous lidocaine injected into the suprapubic catheter stoma ANESTHESIA/SEDATION: None CONTRAST:  15 mL injected into the bladder-administered into the collecting system(s) FLUOROSCOPY TIME:  Fluoroscopy Time: 0 minutes 54 seconds (17 mGy). COMPLICATIONS: None immediate. PROCEDURE: Informed written consent was obtained from the patient after a thorough discussion of the procedural risks, benefits and alternatives. All questions were addressed. Maximal Sterile Barrier Technique was utilized including caps, mask, sterile gowns, sterile gloves, sterile drape, hand hygiene and skin antiseptic. A timeout was performed prior to the initiation of the procedure. A [place] the catheter was successfully navigated through the percutaneous tract and into the bladder. A short Amplatz wire was coiled within the bladder. A  new 47 French balloon retention gastrostomy tube was then advanced over the wire and into the bladder. The retention balloon was filled with 10 mL sterile saline. The external bumper was fixed in place. A gentle hand injection of contrast material opacifies a very large and distended bladder. The bladder was subsequently drained. The patient tolerated the procedure well. IMPRESSION: Successful replacement of a new 88 French balloon retention percutaneous gastrostomy tube as a suprapubic catheter. Electronically Signed   By: Jacqulynn Cadet M.D.   On: 01/08/2019 12:56   Ir Catheter Tube Change  Result Date: 12/26/2018 INDICATION: Complex history significant for bladder cancer post placement of bilateral nephroureteral catheters and ultimately placement of a suprapubic catheter Patient presents today for routine fluoroscopic guided exchange of all pre-existing catheters. EXAM: FLUOROSCOPIC GUIDED BILATERAL SIDED NEPHROSTOMY CATHETER EXCHANGE COMPARISON:  Routine fluoroscopic guided exchange of bilateral nephroureteral catheters and suprapubic catheter-11/08/2028 CONTRAST:  A total of 20 mL Isovue-300 administered was  administered into both collecting systems and the urinary bladder FLUOROSCOPY TIME:  1 minutes, 36 seconds (14.6 mGy) ANESTHESIA/SEDATION: Moderate (conscious) sedation was employed during this procedure. A total of Versed 4 mg and Fentanyl 50 mcg was administered intravenously. Moderate Sedation Time: 26 minutes. The patient's level of consciousness and vital signs were monitored continuously by radiology nursing throughout the procedure under my direct supervision. MEDICATIONS: Rocephin 1 g IV; the antibiotics were administered within an appropriate time frame prior to the initiation of the procedure. COMPLICATIONS: COMPLICATIONS None immediate: TECHNIQUE: Informed written consent was obtained from the patient after a discussion of the risks, benefits and alternatives to treatment. Questions  regarding the procedure were encouraged and answered. A timeout was performed prior to the initiation of the procedure. The bilateral flanks and external portions of existing nephroureteral catheters were prepped and draped in the usual sterile fashion. A sterile drape was applied covering the operative field. Maximum barrier sterile technique with sterile gowns and gloves were used for the procedure. A timeout was performed prior to the initiation of the procedure. A pre procedural spot fluoroscopic image was obtained. Beginning with the left-sided nephroureteral catheter, a small amount of contrast was injected via the existing left-sided nephrostomy catheter demonstrating appropriate positioning within the renal pelvis. The existing catheter was cut and cannulated with an Amplatz wire which was coiled within the urinary bladder. Under intermittent fluoroscopic guidance, the existing nephroureteral catheter was exchanged for a new 10.2 French 24 cm nephroureteral catheter with superior coil within the left renal pelvis and inferior coil within the urinary bladder. Limited contrast injection confirmed appropriate positioning within the left renal pelvis and a post exchange fluoroscopic image was obtained. The catheter was locked and capped. The identical procedure was repeated for the contralateral right kidney ultimately allowing placement of a new 10.2 French, 22 cm nephroureteral catheter with superior coil within the right renal pelvis and inferior coil within the urinary bladder. The right-sided nephroureteral catheter was also capped. Patient was then positioned supine on the fluoroscopy table and the external portion of the suprapubic catheter as well as the surrounding skin was prepped and draped in usual sterile fashion Preprocedural spot fluoroscopic image was obtained of the lower pelvis and existing suprapubic catheter Small amount of contrast was injected via the existing suprapubic catheter  demonstrating appropriate position functionality. The external portion suprapubic catheter was cut and cannulated with a short Amplatz wire which was coiled within the urinary bladder. Under intermittent fluoroscopic guidance, the existing 16 [place] all-purpose drain was exchanged for a new 16 [place] all-purpose drainage catheter with end coiled and locked within the urinary bladder. The suprapubic catheter was connected to a gravity bag. A dressing was placed. The patient tolerated the above procedures well without immediate postprocedural complication. FINDINGS: The existing bilateral nephroureteral are appropriately positioned and functioning. After successful fluoroscopic guided exchange, new bilateral 10.2 French nephroureteral catheters and 16 French all-purpose drainage catheter serving as a suprapubic catheter. IMPRESSION: 1. Successful fluoroscopic guided exchange of bilateral 10.2 French percutaneous nephroureteral catheters, the left measuring 24 cm and the right measuring 22 cm. Both nephroureteral catheters were again capped. 2. Successful fluoroscopic guided exchange 16 French suprapubic catheter. Electronically Signed   By: Sandi Mariscal M.D.   On: 12/26/2018 15:35   Ir Ext Nephroureteral Cath Exchange  Result Date: 01/17/2019 INDICATION: Left antegrade nephroureteral catheter was accidentally severed externally during dressing change and requires exchange. EXAM: IR EXT NEPHROURETERAL CATH EXCHANGE COMPARISON:  None. MEDICATIONS: None ANESTHESIA/SEDATION: None CONTRAST:  5 mL Isovue-300-administered into the collecting system(s) FLUOROSCOPY TIME:  Fluoroscopy Time: 54 seconds.  8.1 mGy. COMPLICATIONS: None immediate. PROCEDURE: Informed written consent was obtained from the patient after a thorough discussion of the procedural risks, benefits and alternatives. All questions were addressed. Maximal Sterile Barrier Technique was utilized including caps, mask, sterile gowns, sterile gloves, sterile  drape, hand hygiene and skin antiseptic. A timeout was performed prior to the initiation of the procedure. The pre-existing left-sided 10 French, 24 cm double-J length nephroureteral catheter was removed over a guidewire. A new catheter was then advanced. After forming the distal portion in the bladder and the proximal J portion in the renal pelvis, the external catheter was injected with contrast material to evaluate internal drainage. The catheter was then flushed and capped. FINDINGS: The new catheter is well positioned with J portions extending from the renal pelvis into the bladder. Injected contrast flows well into the bladder lumen. The external catheter was capped. IMPRESSION: Exchange and replacement of damaged left-sided nephroureteral catheter with a new 10 French catheter. Internal J segments extend from the renal pelvis into the bladder with good internal drainage present. The external catheter was capped. Electronically Signed   By: Aletta Edouard M.D.   On: 01/17/2019 11:22   Ir Ext Nephroureteral Cath Exchange  Result Date: 12/26/2018 INDICATION: Complex history significant for bladder cancer post placement of bilateral nephroureteral catheters and ultimately placement of a suprapubic catheter Patient presents today for routine fluoroscopic guided exchange of all pre-existing catheters. EXAM: FLUOROSCOPIC GUIDED BILATERAL SIDED NEPHROSTOMY CATHETER EXCHANGE COMPARISON:  Routine fluoroscopic guided exchange of bilateral nephroureteral catheters and suprapubic catheter-11/08/2028 CONTRAST:  A total of 20 mL Isovue-300 administered was administered into both collecting systems and the urinary bladder FLUOROSCOPY TIME:  1 minutes, 36 seconds (14.6 mGy) ANESTHESIA/SEDATION: Moderate (conscious) sedation was employed during this procedure. A total of Versed 4 mg and Fentanyl 50 mcg was administered intravenously. Moderate Sedation Time: 26 minutes. The patient's level of consciousness and vital signs  were monitored continuously by radiology nursing throughout the procedure under my direct supervision. MEDICATIONS: Rocephin 1 g IV; the antibiotics were administered within an appropriate time frame prior to the initiation of the procedure. COMPLICATIONS: COMPLICATIONS None immediate: TECHNIQUE: Informed written consent was obtained from the patient after a discussion of the risks, benefits and alternatives to treatment. Questions regarding the procedure were encouraged and answered. A timeout was performed prior to the initiation of the procedure. The bilateral flanks and external portions of existing nephroureteral catheters were prepped and draped in the usual sterile fashion. A sterile drape was applied covering the operative field. Maximum barrier sterile technique with sterile gowns and gloves were used for the procedure. A timeout was performed prior to the initiation of the procedure. A pre procedural spot fluoroscopic image was obtained. Beginning with the left-sided nephroureteral catheter, a small amount of contrast was injected via the existing left-sided nephrostomy catheter demonstrating appropriate positioning within the renal pelvis. The existing catheter was cut and cannulated with an Amplatz wire which was coiled within the urinary bladder. Under intermittent fluoroscopic guidance, the existing nephroureteral catheter was exchanged for a new 10.2 French 24 cm nephroureteral catheter with superior coil within the left renal pelvis and inferior coil within the urinary bladder. Limited contrast injection confirmed appropriate positioning within the left renal pelvis and a post exchange fluoroscopic image was obtained. The catheter was locked and capped. The identical procedure was repeated for the contralateral right kidney ultimately allowing placement of a new  10.2 French, 22 cm nephroureteral catheter with superior coil within the right renal pelvis and inferior coil within the urinary bladder.  The right-sided nephroureteral catheter was also capped. Patient was then positioned supine on the fluoroscopy table and the external portion of the suprapubic catheter as well as the surrounding skin was prepped and draped in usual sterile fashion Preprocedural spot fluoroscopic image was obtained of the lower pelvis and existing suprapubic catheter Small amount of contrast was injected via the existing suprapubic catheter demonstrating appropriate position functionality. The external portion suprapubic catheter was cut and cannulated with a short Amplatz wire which was coiled within the urinary bladder. Under intermittent fluoroscopic guidance, the existing 16 [place] all-purpose drain was exchanged for a new 16 [place] all-purpose drainage catheter with end coiled and locked within the urinary bladder. The suprapubic catheter was connected to a gravity bag. A dressing was placed. The patient tolerated the above procedures well without immediate postprocedural complication. FINDINGS: The existing bilateral nephroureteral are appropriately positioned and functioning. After successful fluoroscopic guided exchange, new bilateral 10.2 French nephroureteral catheters and 16 French all-purpose drainage catheter serving as a suprapubic catheter. IMPRESSION: 1. Successful fluoroscopic guided exchange of bilateral 10.2 French percutaneous nephroureteral catheters, the left measuring 24 cm and the right measuring 22 cm. Both nephroureteral catheters were again capped. 2. Successful fluoroscopic guided exchange 16 French suprapubic catheter. Electronically Signed   By: Sandi Mariscal M.D.   On: 12/26/2018 15:35   Ir Ext Nephroureteral Cath Exchange  Result Date: 12/26/2018 INDICATION: Complex history significant for bladder cancer post placement of bilateral nephroureteral catheters and ultimately placement of a suprapubic catheter Patient presents today for routine fluoroscopic guided exchange of all pre-existing catheters.  EXAM: FLUOROSCOPIC GUIDED BILATERAL SIDED NEPHROSTOMY CATHETER EXCHANGE COMPARISON:  Routine fluoroscopic guided exchange of bilateral nephroureteral catheters and suprapubic catheter-11/08/2028 CONTRAST:  A total of 20 mL Isovue-300 administered was administered into both collecting systems and the urinary bladder FLUOROSCOPY TIME:  1 minutes, 36 seconds (14.6 mGy) ANESTHESIA/SEDATION: Moderate (conscious) sedation was employed during this procedure. A total of Versed 4 mg and Fentanyl 50 mcg was administered intravenously. Moderate Sedation Time: 26 minutes. The patient's level of consciousness and vital signs were monitored continuously by radiology nursing throughout the procedure under my direct supervision. MEDICATIONS: Rocephin 1 g IV; the antibiotics were administered within an appropriate time frame prior to the initiation of the procedure. COMPLICATIONS: COMPLICATIONS None immediate: TECHNIQUE: Informed written consent was obtained from the patient after a discussion of the risks, benefits and alternatives to treatment. Questions regarding the procedure were encouraged and answered. A timeout was performed prior to the initiation of the procedure. The bilateral flanks and external portions of existing nephroureteral catheters were prepped and draped in the usual sterile fashion. A sterile drape was applied covering the operative field. Maximum barrier sterile technique with sterile gowns and gloves were used for the procedure. A timeout was performed prior to the initiation of the procedure. A pre procedural spot fluoroscopic image was obtained. Beginning with the left-sided nephroureteral catheter, a small amount of contrast was injected via the existing left-sided nephrostomy catheter demonstrating appropriate positioning within the renal pelvis. The existing catheter was cut and cannulated with an Amplatz wire which was coiled within the urinary bladder. Under intermittent fluoroscopic guidance, the  existing nephroureteral catheter was exchanged for a new 10.2 French 24 cm nephroureteral catheter with superior coil within the left renal pelvis and inferior coil within the urinary bladder. Limited contrast injection confirmed appropriate positioning within  the left renal pelvis and a post exchange fluoroscopic image was obtained. The catheter was locked and capped. The identical procedure was repeated for the contralateral right kidney ultimately allowing placement of a new 10.2 French, 22 cm nephroureteral catheter with superior coil within the right renal pelvis and inferior coil within the urinary bladder. The right-sided nephroureteral catheter was also capped. Patient was then positioned supine on the fluoroscopy table and the external portion of the suprapubic catheter as well as the surrounding skin was prepped and draped in usual sterile fashion Preprocedural spot fluoroscopic image was obtained of the lower pelvis and existing suprapubic catheter Small amount of contrast was injected via the existing suprapubic catheter demonstrating appropriate position functionality. The external portion suprapubic catheter was cut and cannulated with a short Amplatz wire which was coiled within the urinary bladder. Under intermittent fluoroscopic guidance, the existing 16 [place] all-purpose drain was exchanged for a new 16 [place] all-purpose drainage catheter with end coiled and locked within the urinary bladder. The suprapubic catheter was connected to a gravity bag. A dressing was placed. The patient tolerated the above procedures well without immediate postprocedural complication. FINDINGS: The existing bilateral nephroureteral are appropriately positioned and functioning. After successful fluoroscopic guided exchange, new bilateral 10.2 French nephroureteral catheters and 16 French all-purpose drainage catheter serving as a suprapubic catheter. IMPRESSION: 1. Successful fluoroscopic guided exchange of bilateral  10.2 French percutaneous nephroureteral catheters, the left measuring 24 cm and the right measuring 22 cm. Both nephroureteral catheters were again capped. 2. Successful fluoroscopic guided exchange 16 French suprapubic catheter. Electronically Signed   By: Sandi Mariscal M.D.   On: 12/26/2018 15:35

## 2019-01-26 DIAGNOSIS — N131 Hydronephrosis with ureteral stricture, not elsewhere classified: Secondary | ICD-10-CM | POA: Diagnosis not present

## 2019-01-26 DIAGNOSIS — N130 Hydronephrosis with ureteropelvic junction obstruction: Secondary | ICD-10-CM | POA: Diagnosis not present

## 2019-01-26 DIAGNOSIS — C662 Malignant neoplasm of left ureter: Secondary | ICD-10-CM | POA: Diagnosis not present

## 2019-01-26 DIAGNOSIS — C670 Malignant neoplasm of trigone of bladder: Secondary | ICD-10-CM | POA: Diagnosis not present

## 2019-01-28 ENCOUNTER — Other Ambulatory Visit: Payer: Self-pay | Admitting: Medical

## 2019-01-28 DIAGNOSIS — F419 Anxiety disorder, unspecified: Secondary | ICD-10-CM

## 2019-01-28 DIAGNOSIS — R109 Unspecified abdominal pain: Secondary | ICD-10-CM

## 2019-01-28 DIAGNOSIS — F331 Major depressive disorder, recurrent, moderate: Secondary | ICD-10-CM

## 2019-01-30 ENCOUNTER — Other Ambulatory Visit: Payer: Self-pay | Admitting: Medical

## 2019-01-30 MED ORDER — AMOXICILLIN-POT CLAVULANATE 875-125 MG PO TABS: 1.0000 | ORAL_TABLET | Freq: Two times a day (BID) | ORAL | 0 refills | Status: DC

## 2019-01-31 ENCOUNTER — Other Ambulatory Visit: Payer: Medicare HMO | Admitting: Internal Medicine

## 2019-02-02 ENCOUNTER — Other Ambulatory Visit: Payer: Self-pay | Admitting: Medical

## 2019-02-02 ENCOUNTER — Other Ambulatory Visit: Payer: Self-pay | Admitting: Oncology

## 2019-02-02 ENCOUNTER — Telehealth: Payer: Self-pay | Admitting: Medical

## 2019-02-02 DIAGNOSIS — D649 Anemia, unspecified: Secondary | ICD-10-CM

## 2019-02-02 DIAGNOSIS — C679 Malignant neoplasm of bladder, unspecified: Secondary | ICD-10-CM

## 2019-02-02 DIAGNOSIS — N390 Urinary tract infection, site not specified: Secondary | ICD-10-CM | POA: Diagnosis not present

## 2019-02-02 NOTE — Telephone Encounter (Signed)
Scheduled appt per 2/6 sch message - pt is aware of appt date and time

## 2019-02-03 ENCOUNTER — Inpatient Hospital Stay: Payer: Medicare HMO | Attending: Oncology | Admitting: Medical

## 2019-02-03 ENCOUNTER — Inpatient Hospital Stay: Payer: Medicare HMO

## 2019-02-03 VITALS — BP 95/60 | HR 84 | Temp 97.7°F | Resp 18 | Ht 68.0 in | Wt 193.6 lb

## 2019-02-03 DIAGNOSIS — R410 Disorientation, unspecified: Secondary | ICD-10-CM | POA: Diagnosis not present

## 2019-02-03 DIAGNOSIS — C679 Malignant neoplasm of bladder, unspecified: Secondary | ICD-10-CM | POA: Diagnosis not present

## 2019-02-03 DIAGNOSIS — I509 Heart failure, unspecified: Secondary | ICD-10-CM

## 2019-02-03 DIAGNOSIS — Z95828 Presence of other vascular implants and grafts: Secondary | ICD-10-CM

## 2019-02-03 DIAGNOSIS — F05 Delirium due to known physiological condition: Secondary | ICD-10-CM

## 2019-02-03 DIAGNOSIS — D649 Anemia, unspecified: Secondary | ICD-10-CM

## 2019-02-03 DIAGNOSIS — Z936 Other artificial openings of urinary tract status: Secondary | ICD-10-CM

## 2019-02-03 LAB — CMP (CANCER CENTER ONLY)
ALT: 31 U/L (ref 0–44)
AST: 37 U/L (ref 15–41)
Albumin: 3.2 g/dL — ABNORMAL LOW (ref 3.5–5.0)
Alkaline Phosphatase: 66 U/L (ref 38–126)
Anion gap: 13 (ref 5–15)
BUN: 57 mg/dL — ABNORMAL HIGH (ref 8–23)
CO2: 21 mmol/L — ABNORMAL LOW (ref 22–32)
Calcium: 9 mg/dL (ref 8.9–10.3)
Chloride: 96 mmol/L — ABNORMAL LOW (ref 98–111)
Creatinine: 2.26 mg/dL — ABNORMAL HIGH (ref 0.61–1.24)
GFR, Est AFR Am: 30 mL/min — ABNORMAL LOW (ref >60)
GFR, Estimated: 26 mL/min — ABNORMAL LOW (ref >60)
GLUCOSE: 121 mg/dL — AB (ref 70–99)
Potassium: 3.9 mmol/L (ref 3.5–5.1)
Sodium: 130 mmol/L — ABNORMAL LOW (ref 135–145)
Total Bilirubin: 1.7 mg/dL — ABNORMAL HIGH (ref 0.3–1.2)
Total Protein: 6.9 g/dL (ref 6.5–8.1)

## 2019-02-03 LAB — CBC WITH DIFFERENTIAL (CANCER CENTER ONLY)
Abs Immature Granulocytes: 0.21 10*3/uL — ABNORMAL HIGH (ref 0.00–0.07)
Basophils Absolute: 0 10*3/uL (ref 0.0–0.1)
Basophils Relative: 1 %
Eosinophils Absolute: 0.1 10*3/uL (ref 0.0–0.5)
Eosinophils Relative: 2 %
HCT: 22.3 % — ABNORMAL LOW (ref 39.0–52.0)
Hemoglobin: 7.3 g/dL — ABNORMAL LOW (ref 13.0–17.0)
Immature Granulocytes: 3 %
Lymphocytes Relative: 14 %
Lymphs Abs: 0.9 10*3/uL (ref 0.7–4.0)
MCH: 32 pg (ref 26.0–34.0)
MCHC: 32.7 g/dL (ref 30.0–36.0)
MCV: 97.8 fL (ref 80.0–100.0)
Monocytes Absolute: 0.6 10*3/uL (ref 0.1–1.0)
Monocytes Relative: 9 %
NEUTROS PCT: 71 %
Neutro Abs: 4.6 10*3/uL (ref 1.7–7.7)
Platelet Count: 50 10*3/uL — ABNORMAL LOW (ref 150–400)
RBC: 2.28 MIL/uL — AB (ref 4.22–5.81)
RDW: 20.8 % — ABNORMAL HIGH (ref 11.5–15.5)
WBC Count: 6.5 10*3/uL (ref 4.0–10.5)
nRBC: 0 % (ref 0.0–0.2)

## 2019-02-03 LAB — SAMPLE TO BLOOD BANK

## 2019-02-03 MED ORDER — HEPARIN SOD (PORK) LOCK FLUSH 100 UNIT/ML IV SOLN
500.0000 [IU] | Freq: Once | INTRAVENOUS | Status: AC
  Administered 2019-02-03: 500 [IU]
  Filled 2019-02-03: qty 5

## 2019-02-03 MED ORDER — SODIUM CHLORIDE 0.9% FLUSH
10.0000 mL | Freq: Once | INTRAVENOUS | Status: AC
  Administered 2019-02-03: 10 mL
  Filled 2019-02-03: qty 10

## 2019-02-03 MED ORDER — HALOPERIDOL 1 MG PO TABS: ORAL_TABLET | ORAL | 1 refills | Status: DC

## 2019-02-03 NOTE — Progress Notes (Signed)
[facility] Progress Note   Charles Coffey [id number] September 10, 1935 84 y.o.  Charles Coffey is managed by Dr. [name]  Actively treated with chemotherapy/immunotherapy/hormonal therapy: no  Assessment: Plan:    Sundowning - Plan: haloperidol (HALDOL) 1 MG tablet  Port-A-Cath in place - Plan: heparin lock flush 100 unit/mL, sodium chloride flush (NS) 0.9 % injection 10 mL  Urothelial carcinoma of bladder ([place]) - Plan: heparin lock flush 100 unit/mL, sodium chloride flush (NS) 0.9 % injection 10 mL   Urothelial carcinoma of the bladder: The patient and his wife  are agreeable to proceed with hospice after having his nephrostomy tubes internalized.  I will reach out to his urologist Dr. Ernesta Amble to determine if he could have his nephrostomy tubes internalized earlier then the beginning of March or if they could be left externally for an extended period of time.  The patient began Augmentin on Monday after having a temperature of 102.  A urine specimen was taken to his urologist yesterday with results pending.  Anemia: A CBC was completed which returned showing a hemoglobin of   7.3 and a hematocrit of 22.3.  He was offered a transfusion with 1 unit of packed red blood cells today which he declined.  Suspected sundowning: The patient was given a prescription for Haldol 1 mg and was instructed to take 1 to 2 mg at bedtime.  His medications were reviewed.  There was no indication of an interaction between Haldol and his other medications.  Please see After Visit Summary for patient specific instructions.  Future Appointments  Date Time Provider [place]  03/17/2019  9:15 AM [facility] LAB 5 [facility] None  03/17/2019  9:30 AM [facility] [place] FLUSH [facility] None  03/17/2019 10:00 AM Wyatt Portela, MD [facility] None  03/20/2019  1:00 PM [facility] ROOM [facility] None  03/20/2019  2:30 PM [facility] 1 [facility] [place]  07/11/2019  3:30 PM Cameron Sprang, MD [facility] None     No orders of the defined types were placed in this encounter.      Subjective:   Patient ID:  Charles Coffey is a 84 y.o. (DOB August 24, 1935) male.  Chief Complaint:  Chief Complaint  Patient presents with  . Fatigue    HPI Charles Coffey  is an 84 year old male with a history of a metastatic urothelial carcinoma and congestive heart failure. He is not receiving treatment at this time as Dr. [name] has indicated to the patient and his wife that there were no additional therapies available that would likely provide benefit. Charles Coffey is receiving home palliative care and has been considering if he would like to transition to hospice. He was last seen on 01/20/2019. Our office was contacted yesterday by Charles Coffey who reported that the patient was significantly weaker. He has anemia and was last transfused on 01/20/2019.  He has had shortness of breath.  He had a fever of 102 on Monday and was begun on Augmentin.  His fever continued Monday, Tuesday, and Wednesday.  His fever finally broke on Wednesday evening.  He also had increased confusion during this.  He had decreased urinary output to his suprapubic catheter on Monday, Tuesday, Wednesday, and Thursday.  He has decreased his use of Lasix for the last "couple of days".  [name] took a urine sample to urology yesterday.  They are awaiting these results.  The patient was seen by Dr. [name] and urology recently.  They questioned if he could  have his nephrostomy tubes internalized.  Dr. [name] reportedly stated that he would have to wait until March to have this done.  The patient continues to have bladder spasms.  He will be seen Tuesday by a general surgeon regarding his prolapsed stoma.  The patient and his wife are agreeable to proceed with hospice after having his nephrostomy tubes internalized.  They are agreeable for me to reach out to Dr. [name] to ask if this could be done sooner or if his external nephrostomy tubes could be  left for an extended.  Medications: I have reviewed the patient's current medications.  Allergies:  Allergies  Allergen Reactions  . Statins Other (See Comments)    liver effects  . Cyproheptadine Other (See Comments)    Unable to urinate    Past Medical History:  Diagnosis Date  . Aortic atherosclerosis ([place])   . BPH with urinary obstruction   . CAD (coronary artery disease)    a.  MI 1995, CABG x 3 2002 (patient says that he had LIMA and RIMA grafts). b. ETT-Cardiolite (10/15) with EF 48%, apical scar, no ischemia. c. Nuc 07/2017 abnormal -> cath was performed,  patent LIMA to LAD and SVG to OM 2. RIMA to RCA is atretic. The right coronary artery is occluded distally with extensive collaterals from the LAD, medical therapy.   . Cancer [facility])    bladder cancer  . Cervical spondylosis without myelopathy Feb 18, 202017  . Chronic low back pain    sees Dr. Kary Kos   . GERD (gastroesophageal reflux disease)   . Hiatal hernia   . Hyperlipidemia   . Hypertension   . IBS (irritable bowel syndrome)   . Ischemic cardiomyopathy   . Myocardial infarction ([place])   . RBBB   . Restless legs syndrome   . Statin intolerance   . Syncope    a. in 2015 - no apparent cause, was taking sleep medicine at the time. Cardiac workup unremarkable.  . Tubular adenoma of colon     Past Surgical History:  Procedure Laterality Date  . COLONOSCOPY  10/17/2014   per Dr. Hilarie Fredrickson, tubular adenomas, repeat in 3 yrs   . COLONOSCOPY WITH PROPOFOL N/A 12/01/2017   Procedure: COLONOSCOPY WITH PROPOFOL;  Surgeon: Milus Banister, MD;  [facility];  Service: Endoscopy;  Laterality: N/A;  . CYSTOSCOPY W/ RETROGRADES Left 11/25/2017   Procedure: CYSTOSCOPY WITH RETROGRADE PYELOGRAM;  Surgeon: Irine Seal, MD;  [facility];  Service: Urology;  Laterality: Left;  . HEART BYPASS    . IR CATHETER TUBE CHANGE  11/08/2018  . IR CATHETER TUBE CHANGE  12/26/2018  . IR CATHETER TUBE CHANGE  01/08/2019  . IR  CATHETER TUBE CHANGE  01/13/2019  . IR CONVERT LEFT NEPHROSTOMY TO NEPHROURETERAL CATH  09/20/2018  . IR CONVERT RIGHT NEPHROSTOMY TO NEPHROURETERAL CATH  02/04/2018  . IR CONVERT RIGHT NEPHROSTOMY TO NEPHROURETERAL CATH  08/23/2018  . IR EXT NEPHROURETERAL CATH EXCHANGE  04/11/2018  . IR EXT NEPHROURETERAL CATH EXCHANGE  07/04/2018  . IR EXT NEPHROURETERAL CATH EXCHANGE  09/20/2018  . IR EXT NEPHROURETERAL CATH EXCHANGE  11/08/2018  . IR EXT NEPHROURETERAL CATH EXCHANGE  11/08/2018  . IR EXT NEPHROURETERAL CATH EXCHANGE  12/26/2018  . IR EXT NEPHROURETERAL CATH EXCHANGE  12/26/2018  . IR EXT NEPHROURETERAL CATH EXCHANGE  01/17/2019  . IR FLUORO GUIDE CV LINE RIGHT  12/27/2017  . IR FLUORO GUIDED NEEDLE PLC ASPIRATION/INJECTION LOC  08/19/2018  . IR NEPHROSTOGRAM LEFT THRU  EXISTING ACCESS  12/24/2017  . IR NEPHROSTOMY EXCHANGE LEFT  01/28/2018  . IR NEPHROSTOMY EXCHANGE LEFT  02/04/2018  . IR NEPHROSTOMY EXCHANGE LEFT  02/25/2018  . IR NEPHROSTOMY EXCHANGE LEFT  07/04/2018  . IR NEPHROSTOMY EXCHANGE LEFT  07/05/2018  . IR NEPHROSTOMY EXCHANGE LEFT  08/09/2018  . IR NEPHROSTOMY EXCHANGE LEFT  08/16/2018  . IR NEPHROSTOMY EXCHANGE LEFT  08/23/2018  . IR NEPHROSTOMY EXCHANGE RIGHT  12/24/2017  . IR NEPHROSTOMY EXCHANGE RIGHT  01/28/2018  . IR NEPHROSTOMY EXCHANGE RIGHT  04/11/2018  . IR NEPHROSTOMY EXCHANGE RIGHT  07/05/2018  . IR NEPHROSTOMY EXCHANGE RIGHT  08/09/2018  . IR NEPHROSTOMY EXCHANGE RIGHT  08/19/2018  . IR NEPHROSTOMY PLACEMENT LEFT  11/28/2017  . IR NEPHROSTOMY PLACEMENT RIGHT  12/03/2017  . IR PATIENT EVAL TECH 0-60 MINS  03/24/2018  . IR PATIENT EVAL TECH 0-60 MINS  03/28/2018  . IR PATIENT EVAL TECH 0-60 MINS  04/25/2018  . IR PATIENT EVAL TECH 0-60 MINS  06/20/2018  . IR US GUIDE VASC ACCESS RIGHT  12/27/2017  . LAPAROSCOPY N/A 12/01/2017   Procedure: LAPAROSCOPIC DIVERTING OSTOMY;  Surgeon: Stark Klein, MD;  [facility];  Service: General;  Laterality: N/A;  . LEFT HEART CATH AND CORS/GRAFTS  ANGIOGRAPHY N/A 08/25/2017   Procedure: LEFT HEART CATH AND CORS/GRAFTS ANGIOGRAPHY;  Surgeon: Wellington Hampshire, MD;  Location: [place] CV LAB;  Service: Cardiovascular;  Laterality: N/A;  . LUMBAR FUSION  2003   L3-L4  . PROSTATE SURGERY  06-27-12   per Dr. Roni Bread, had CTT  . TONSILLECTOMY    . TRANSURETHRAL RESECTION OF BLADDER TUMOR N/A 11/25/2017   Procedure: TRANSURETHRAL RESECTION OF BLADDER TUMOR (TURBT);  Surgeon: Irine Seal, MD;  [facility];  Service: Urology;  Laterality: N/A;    Family History  Problem Relation Age of Onset  . Heart attack Mother   . Aneurysm Father        femoral artery  . Heart disease Brother   . Heart disease Maternal Uncle        x 2  . Colon cancer Neg Hx   . Esophageal cancer Neg Hx   . Pancreatic cancer Neg Hx   . Kidney disease Neg Hx   . Liver disease Neg Hx     Social History   Socioeconomic History  . Marital status: Married    Spouse name: Not on file  . Number of children: 5  . Years of education: Some coll  . Highest education level: Not on file  Occupational History  . Occupation: retired  Scientific laboratory technician  . Financial resource strain: Not on file  . Food insecurity:    Worry: Not on file    Inability: Not on file  . Transportation needs:    Medical: Not on file    Non-medical: Not on file  Tobacco Use  . Smoking status: Former Smoker    Types: Cigarettes    Last attempt to quit: 12/28/1978    Years since quitting: 40.1  . Smokeless tobacco: Never Used  Substance and Sexual Activity  . Alcohol use: Not Currently    Alcohol/week: 2.0 standard drinks    Types: 1 Glasses of wine, 1 Cans of beer per week    Comment: occassional  . Drug use: No  . Sexual activity: Not Currently  Lifestyle  . Physical activity:    Days per week: Not on file    Minutes per session: Not on file  . Stress: Not  on file  Relationships  . Social connections:    Talks on phone: Not on file    Gets together: Not on file    Attends  religious service: Not on file    Active member of club or organization: Not on file    Attends meetings of clubs or organizations: Not on file    Relationship status: Not on file  . Intimate partner violence:    Fear of current or ex partner: Not on file    Emotionally abused: Not on file    Physically abused: Not on file    Forced sexual activity: Not on file  Other Topics Concern  . Not on file  Social History Narrative   Lives at home w/ his wife   Right-handed   5 cups of coffee per day    Past Medical History, Surgical history, Social history, and Family history were reviewed and updated as appropriate.   Please see review of systems for further details on the patient's review from today.   Review of Systems:  Review of Systems  Constitutional: Positive for fever. Negative for appetite change, chills, diaphoresis and unexpected weight change.  HENT: Negative for trouble swallowing.   Respiratory: Negative for cough and shortness of breath.   Cardiovascular: Negative for chest pain, palpitations and leg swelling.  Gastrointestinal: Negative for abdominal distention, abdominal pain, anal bleeding, blood in stool, constipation, diarrhea, nausea, rectal pain and vomiting.  Genitourinary: Positive for difficulty urinating. Negative for dysuria, flank pain, frequency, hematuria and urgency.  Skin: Negative for rash.  Neurological: Negative for dizziness and headaches.  Psychiatric/Behavioral: Positive for confusion and sleep disturbance.    Objective:   Physical Exam:  BP 95/60 (BP Location: Left Arm, Patient Position: Sitting)   Pulse 84   Temp 97.7 F (36.5 C) (Oral)   Resp 18   Ht 5\' 8"  (1.727 m)   Wt 193 lb 9.6 oz (87.8 kg)   SpO2 98%   BMI 29.44 kg/m  ECOG: 2  Physical Exam Constitutional:      Appearance: Normal appearance. He is not ill-appearing or toxic-appearing.  HENT:     Head: Normocephalic and atraumatic.  Cardiovascular:     Rate and Rhythm:  Normal rate and regular rhythm.  Pulmonary:     Effort: Pulmonary effort is normal. No respiratory distress.     Breath sounds: Normal breath sounds. No wheezing or rales.  Skin:    General: Skin is warm and dry.     Coloration: Skin is pale. Skin is not jaundiced.  Neurological:     Mental Status: He is lethargic.     Gait: Gait normal.     Lab Review:     Component Value Date/Time   NA 130 (L) 02/03/2019 1037   NA 137 11/14/2018 1656   NA 133 (L) 12/29/2017 1014   K 3.9 02/03/2019 1037   K 4.4 12/29/2017 1014   CL 96 (L) 02/03/2019 1037   CO2 21 (L) 02/03/2019 1037   CO2 20 (L) 12/29/2017 1014   GLUCOSE 121 (H) 02/03/2019 1037   GLUCOSE 102 12/29/2017 1014   BUN 57 (H) 02/03/2019 1037   BUN 35 (H) 11/14/2018 1656   BUN 31.5 (H) 12/29/2017 1014   CREATININE 2.26 (H) 02/03/2019 1037   CREATININE 2.22 (H) 12/20/2018 1211   CREATININE 1.6 (H) 12/29/2017 1014   CALCIUM 9.0 02/03/2019 1037   CALCIUM 9.6 12/29/2017 1014   PROT 6.9 02/03/2019 1037   PROT 6.8 12/29/2017 1014  ALBUMIN 3.2 (L) 02/03/2019 1037   ALBUMIN 3.8 12/29/2017 1014   AST 37 02/03/2019 1037   AST 15 12/29/2017 1014   ALT 31 02/03/2019 1037   ALT 13 12/29/2017 1014   ALKPHOS 66 02/03/2019 1037   ALKPHOS 61 12/29/2017 1014   BILITOT 1.7 (H) 02/03/2019 1037   BILITOT 0.43 12/29/2017 1014   GFRNONAA 26 (L) 02/03/2019 1037   GFRAA 30 (L) 02/03/2019 1037       Component Value Date/Time   WBC 6.5 02/03/2019 1037   WBC 5.9 12/26/2018 1217   RBC 2.28 (L) 02/03/2019 1037   HGB 7.3 (L) 02/03/2019 1037   HGB 8.2 (L) 11/14/2018 1656   HGB 8.8 (L) 12/29/2017 1014   HCT 22.3 (L) 02/03/2019 1037   HCT 24.4 (L) 11/14/2018 1656   HCT 26.0 (L) 12/29/2017 1014   PLT 50 (L) 02/03/2019 1037   PLT 64 (LL) 11/14/2018 1656   MCV 97.8 02/03/2019 1037   MCV 96 11/14/2018 1656   MCV 89.3 12/29/2017 1014   MCH 32.0 02/03/2019 1037   MCHC 32.7 02/03/2019 1037   RDW 20.8 (H) 02/03/2019 1037   RDW 18.8 (H)  11/14/2018 1656   RDW 15.5 (H) 12/29/2017 1014   LYMPHSABS 0.9 02/03/2019 1037   LYMPHSABS 1.4 12/29/2017 1014   MONOABS 0.6 02/03/2019 1037   MONOABS 0.6 12/29/2017 1014   EOSABS 0.1 02/03/2019 1037   EOSABS 0.2 12/29/2017 1014   BASOSABS 0.0 02/03/2019 1037   BASOSABS 0.0 12/29/2017 1014   -------------------------------  Imaging from last 24 hours (if applicable):  Radiology interpretation: Ir Catheter Tube Change  Result Date: 01/13/2019 INDICATION: 84 year old with history of bladder cancer and suprapubic catheter. Suprapubic catheter was dislodged last night and the family replaced the catheter at home. Plan for catheter check and exchange. EXAM: SUPRAPUBIC CATHETER EXCHANGE WITH FLUOROSCOPY COMPARISON:  None. MEDICATIONS: Patient is on oral antibiotics. ANESTHESIA/SEDATION: None CONTRAST:  5 mL Isovue-300-administered into the collecting system(s) FLUOROSCOPY TIME:  Fluoroscopy Time: 6 seconds COMPLICATIONS: None immediate. PROCEDURE: Informed written consent was obtained from the patient after a thorough discussion of the procedural risks, benefits and alternatives. All questions were addressed. Maximal Sterile Barrier Technique was utilized including caps, mask, sterile gowns, sterile gloves, sterile drape, hand hygiene and skin antiseptic. A timeout was performed prior to the initiation of the procedure. The old catheter balloon was deflated. New 16 French Entuit catheter was easily advanced into the bladder. Balloon was inflated with 5 mL of saline. Contrast injection confirmed placement in the bladder. FINDINGS: Suprapubic catheter is well positioned in the bladder. Bilateral nephroureteral catheters are present. IMPRESSION: Successful exchange of the suprapubic catheter. Electronically Signed   By: Markus Daft M.D.   On: 01/13/2019 13:54   Ir Catheter Tube Change  Result Date: 01/08/2019 INDICATION: 84 year old male with a history of bladder outlet obstruction. He has bilateral  percutaneous nephroureteral tubes and a suprapubic catheter. Unfortunately, the suprapubic catheter was displaced last night and he now has urinary retention and requires urgent replacement. EXAM: Suprapubic catheter replacement COMPARISON:  Prior exam 12/26/2018 MEDICATIONS: Viscous lidocaine injected into the suprapubic catheter stoma ANESTHESIA/SEDATION: None CONTRAST:  15 mL injected into the bladder-administered into the collecting system(s) FLUOROSCOPY TIME:  Fluoroscopy Time: 0 minutes 54 seconds (17 mGy). COMPLICATIONS: None immediate. PROCEDURE: Informed written consent was obtained from the patient after a thorough discussion of the procedural risks, benefits and alternatives. All questions were addressed. Maximal Sterile Barrier Technique was utilized including caps, mask, sterile gowns, sterile gloves,  sterile drape, hand hygiene and skin antiseptic. A timeout was performed prior to the initiation of the procedure. A Kumpe the catheter was successfully navigated through the percutaneous tract and into the bladder. A short Amplatz wire was coiled within the bladder. A new 68 French balloon retention gastrostomy tube was then advanced over the wire and into the bladder. The retention balloon was filled with 10 mL sterile saline. The external bumper was fixed in place. A gentle hand injection of contrast material opacifies a very large and distended bladder. The bladder was subsequently drained. The patient tolerated the procedure well. IMPRESSION: Successful replacement of a new 81 French balloon retention percutaneous gastrostomy tube as a suprapubic catheter. Electronically Signed   By: Jacqulynn Cadet M.D.   On: 01/08/2019 12:56   Ir Ext Nephroureteral Cath Exchange  Result Date: 01/17/2019 INDICATION: Left antegrade nephroureteral catheter was accidentally severed externally during dressing change and requires exchange. EXAM: IR EXT NEPHROURETERAL CATH EXCHANGE COMPARISON:  None. MEDICATIONS:  None ANESTHESIA/SEDATION: None CONTRAST:  5 mL Isovue-300-administered into the collecting system(s) FLUOROSCOPY TIME:  Fluoroscopy Time: 54 seconds.  8.1 mGy. COMPLICATIONS: None immediate. PROCEDURE: Informed written consent was obtained from the patient after a thorough discussion of the procedural risks, benefits and alternatives. All questions were addressed. Maximal Sterile Barrier Technique was utilized including caps, mask, sterile gowns, sterile gloves, sterile drape, hand hygiene and skin antiseptic. A timeout was performed prior to the initiation of the procedure. The pre-existing left-sided 10 French, 24 cm double-J length nephroureteral catheter was removed over a guidewire. A new catheter was then advanced. After forming the distal portion in the bladder and the proximal J portion in the renal pelvis, the external catheter was injected with contrast material to evaluate internal drainage. The catheter was then flushed and capped. FINDINGS: The new catheter is well positioned with J portions extending from the renal pelvis into the bladder. Injected contrast flows well into the bladder lumen. The external catheter was capped. IMPRESSION: Exchange and replacement of damaged left-sided nephroureteral catheter with a new 10 French catheter. Internal J segments extend from the renal pelvis into the bladder with good internal drainage present. The external catheter was capped. Electronically Signed   By: Aletta Edouard M.D.   On: 01/17/2019 11:22

## 2019-02-03 NOTE — Patient Instructions (Signed)

## 2019-02-08 ENCOUNTER — Emergency Department (HOSPITAL_COMMUNITY): Payer: Medicare HMO

## 2019-02-08 ENCOUNTER — Other Ambulatory Visit: Payer: Self-pay

## 2019-02-08 ENCOUNTER — Encounter (HOSPITAL_COMMUNITY): Payer: Self-pay | Admitting: Family Medicine

## 2019-02-08 ENCOUNTER — Telehealth: Payer: Self-pay | Admitting: Medical

## 2019-02-08 ENCOUNTER — Telehealth: Payer: Self-pay | Admitting: Internal Medicine

## 2019-02-08 ENCOUNTER — Inpatient Hospital Stay (HOSPITAL_COMMUNITY)
Admission: EM | Admit: 2019-02-08 | Discharge: 2019-02-21 | DRG: 656 | Disposition: A | Discharge status: Hospice | Payer: Medicare HMO | Attending: Internal Medicine | Admitting: Internal Medicine

## 2019-02-08 DIAGNOSIS — G2581 Restless legs syndrome: Secondary | ICD-10-CM

## 2019-02-08 DIAGNOSIS — F329 Major depressive disorder, single episode, unspecified: Secondary | ICD-10-CM | POA: Diagnosis present

## 2019-02-08 DIAGNOSIS — N183 Chronic kidney disease, stage 3 unspecified: Secondary | ICD-10-CM | POA: Diagnosis present

## 2019-02-08 DIAGNOSIS — T83511A Infection and inflammatory reaction due to indwelling urethral catheter, initial encounter: Secondary | ICD-10-CM | POA: Diagnosis not present

## 2019-02-08 DIAGNOSIS — G4700 Insomnia, unspecified: Secondary | ICD-10-CM | POA: Diagnosis present

## 2019-02-08 DIAGNOSIS — R7989 Other specified abnormal findings of blood chemistry: Secondary | ICD-10-CM | POA: Diagnosis not present

## 2019-02-08 DIAGNOSIS — K589 Irritable bowel syndrome without diarrhea: Secondary | ICD-10-CM | POA: Diagnosis present

## 2019-02-08 DIAGNOSIS — R410 Disorientation, unspecified: Secondary | ICD-10-CM | POA: Diagnosis present

## 2019-02-08 DIAGNOSIS — I700 Atherosclerosis of aorta: Secondary | ICD-10-CM | POA: Diagnosis present

## 2019-02-08 DIAGNOSIS — Z933 Colostomy status: Secondary | ICD-10-CM

## 2019-02-08 DIAGNOSIS — F410 Panic disorder [episodic paroxysmal anxiety] without agoraphobia: Secondary | ICD-10-CM | POA: Diagnosis present

## 2019-02-08 DIAGNOSIS — R5381 Other malaise: Secondary | ICD-10-CM | POA: Diagnosis not present

## 2019-02-08 DIAGNOSIS — N179 Acute kidney failure, unspecified: Secondary | ICD-10-CM | POA: Diagnosis not present

## 2019-02-08 DIAGNOSIS — R45851 Suicidal ideations: Secondary | ICD-10-CM | POA: Diagnosis present

## 2019-02-08 DIAGNOSIS — Z7189 Other specified counseling: Secondary | ICD-10-CM

## 2019-02-08 DIAGNOSIS — Z79899 Other long term (current) drug therapy: Secondary | ICD-10-CM

## 2019-02-08 DIAGNOSIS — R778 Other specified abnormalities of plasma proteins: Secondary | ICD-10-CM | POA: Diagnosis present

## 2019-02-08 DIAGNOSIS — D649 Anemia, unspecified: Secondary | ICD-10-CM | POA: Diagnosis not present

## 2019-02-08 DIAGNOSIS — D630 Anemia in neoplastic disease: Secondary | ICD-10-CM | POA: Diagnosis present

## 2019-02-08 DIAGNOSIS — N390 Urinary tract infection, site not specified: Secondary | ICD-10-CM

## 2019-02-08 DIAGNOSIS — J189 Pneumonia, unspecified organism: Secondary | ICD-10-CM | POA: Diagnosis not present

## 2019-02-08 DIAGNOSIS — Y846 Urinary catheterization as the cause of abnormal reaction of the patient, or of later complication, without mention of misadventure at the time of the procedure: Secondary | ICD-10-CM | POA: Diagnosis present

## 2019-02-08 DIAGNOSIS — Z951 Presence of aortocoronary bypass graft: Secondary | ICD-10-CM

## 2019-02-08 DIAGNOSIS — N136 Pyonephrosis: Secondary | ICD-10-CM | POA: Diagnosis present

## 2019-02-08 DIAGNOSIS — M47812 Spondylosis without myelopathy or radiculopathy, cervical region: Secondary | ICD-10-CM | POA: Diagnosis present

## 2019-02-08 DIAGNOSIS — C799 Secondary malignant neoplasm of unspecified site: Secondary | ICD-10-CM | POA: Diagnosis present

## 2019-02-08 DIAGNOSIS — Z981 Arthrodesis status: Secondary | ICD-10-CM

## 2019-02-08 DIAGNOSIS — F0390 Unspecified dementia without behavioral disturbance: Secondary | ICD-10-CM | POA: Diagnosis present

## 2019-02-08 DIAGNOSIS — R451 Restlessness and agitation: Secondary | ICD-10-CM | POA: Diagnosis present

## 2019-02-08 DIAGNOSIS — N401 Enlarged prostate with lower urinary tract symptoms: Secondary | ICD-10-CM | POA: Diagnosis present

## 2019-02-08 DIAGNOSIS — Z888 Allergy status to other drugs, medicaments and biological substances status: Secondary | ICD-10-CM

## 2019-02-08 DIAGNOSIS — I4510 Unspecified right bundle-branch block: Secondary | ICD-10-CM | POA: Diagnosis present

## 2019-02-08 DIAGNOSIS — R4182 Altered mental status, unspecified: Secondary | ICD-10-CM | POA: Diagnosis not present

## 2019-02-08 DIAGNOSIS — R69 Illness, unspecified: Secondary | ICD-10-CM | POA: Diagnosis not present

## 2019-02-08 DIAGNOSIS — R45850 Homicidal ideations: Secondary | ICD-10-CM | POA: Diagnosis present

## 2019-02-08 DIAGNOSIS — Z6829 Body mass index (BMI) 29.0-29.9, adult: Secondary | ICD-10-CM

## 2019-02-08 DIAGNOSIS — D631 Anemia in chronic kidney disease: Secondary | ICD-10-CM | POA: Diagnosis present

## 2019-02-08 DIAGNOSIS — I25118 Atherosclerotic heart disease of native coronary artery with other forms of angina pectoris: Secondary | ICD-10-CM

## 2019-02-08 DIAGNOSIS — N1330 Unspecified hydronephrosis: Secondary | ICD-10-CM

## 2019-02-08 DIAGNOSIS — N135 Crossing vessel and stricture of ureter without hydronephrosis: Secondary | ICD-10-CM

## 2019-02-08 DIAGNOSIS — J918 Pleural effusion in other conditions classified elsewhere: Secondary | ICD-10-CM | POA: Diagnosis present

## 2019-02-08 DIAGNOSIS — S0993XA Unspecified injury of face, initial encounter: Secondary | ICD-10-CM

## 2019-02-08 DIAGNOSIS — C679 Malignant neoplasm of bladder, unspecified: Secondary | ICD-10-CM | POA: Diagnosis present

## 2019-02-08 DIAGNOSIS — R17 Unspecified jaundice: Secondary | ICD-10-CM | POA: Diagnosis present

## 2019-02-08 DIAGNOSIS — I5023 Acute on chronic systolic (congestive) heart failure: Secondary | ICD-10-CM | POA: Diagnosis present

## 2019-02-08 DIAGNOSIS — E785 Hyperlipidemia, unspecified: Secondary | ICD-10-CM

## 2019-02-08 DIAGNOSIS — I5022 Chronic systolic (congestive) heart failure: Secondary | ICD-10-CM

## 2019-02-08 DIAGNOSIS — Z9889 Other specified postprocedural states: Secondary | ICD-10-CM

## 2019-02-08 DIAGNOSIS — E871 Hypo-osmolality and hyponatremia: Secondary | ICD-10-CM

## 2019-02-08 DIAGNOSIS — R9431 Abnormal electrocardiogram [ECG] [EKG]: Secondary | ICD-10-CM | POA: Diagnosis not present

## 2019-02-08 DIAGNOSIS — R319 Hematuria, unspecified: Secondary | ICD-10-CM | POA: Diagnosis not present

## 2019-02-08 DIAGNOSIS — T83518A Infection and inflammatory reaction due to other urinary catheter, initial encounter: Secondary | ICD-10-CM | POA: Diagnosis present

## 2019-02-08 DIAGNOSIS — E860 Dehydration: Secondary | ICD-10-CM | POA: Diagnosis present

## 2019-02-08 DIAGNOSIS — I130 Hypertensive heart and chronic kidney disease with heart failure and stage 1 through stage 4 chronic kidney disease, or unspecified chronic kidney disease: Secondary | ICD-10-CM | POA: Diagnosis present

## 2019-02-08 DIAGNOSIS — R4020 Unspecified coma: Secondary | ICD-10-CM | POA: Diagnosis not present

## 2019-02-08 DIAGNOSIS — R918 Other nonspecific abnormal finding of lung field: Secondary | ICD-10-CM | POA: Diagnosis not present

## 2019-02-08 DIAGNOSIS — Z9221 Personal history of antineoplastic chemotherapy: Secondary | ICD-10-CM

## 2019-02-08 DIAGNOSIS — Z8249 Family history of ischemic heart disease and other diseases of the circulatory system: Secondary | ICD-10-CM

## 2019-02-08 DIAGNOSIS — Z79891 Long term (current) use of opiate analgesic: Secondary | ICD-10-CM

## 2019-02-08 DIAGNOSIS — N320 Bladder-neck obstruction: Secondary | ICD-10-CM | POA: Diagnosis present

## 2019-02-08 DIAGNOSIS — K219 Gastro-esophageal reflux disease without esophagitis: Secondary | ICD-10-CM | POA: Diagnosis not present

## 2019-02-08 DIAGNOSIS — D6959 Other secondary thrombocytopenia: Secondary | ICD-10-CM | POA: Diagnosis present

## 2019-02-08 DIAGNOSIS — G9340 Encephalopathy, unspecified: Secondary | ICD-10-CM | POA: Diagnosis present

## 2019-02-08 DIAGNOSIS — G8929 Other chronic pain: Secondary | ICD-10-CM | POA: Diagnosis present

## 2019-02-08 DIAGNOSIS — E669 Obesity, unspecified: Secondary | ICD-10-CM | POA: Diagnosis present

## 2019-02-08 DIAGNOSIS — I248 Other forms of acute ischemic heart disease: Secondary | ICD-10-CM | POA: Diagnosis present

## 2019-02-08 DIAGNOSIS — E876 Hypokalemia: Secondary | ICD-10-CM

## 2019-02-08 DIAGNOSIS — D691 Qualitative platelet defects: Secondary | ICD-10-CM | POA: Diagnosis present

## 2019-02-08 DIAGNOSIS — I10 Essential (primary) hypertension: Secondary | ICD-10-CM

## 2019-02-08 DIAGNOSIS — I252 Old myocardial infarction: Secondary | ICD-10-CM

## 2019-02-08 DIAGNOSIS — F05 Delirium due to known physiological condition: Secondary | ICD-10-CM | POA: Diagnosis present

## 2019-02-08 DIAGNOSIS — Z66 Do not resuscitate: Secondary | ICD-10-CM | POA: Diagnosis present

## 2019-02-08 DIAGNOSIS — R0600 Dyspnea, unspecified: Secondary | ICD-10-CM

## 2019-02-08 DIAGNOSIS — Z87891 Personal history of nicotine dependence: Secondary | ICD-10-CM

## 2019-02-08 DIAGNOSIS — R404 Transient alteration of awareness: Secondary | ICD-10-CM | POA: Diagnosis not present

## 2019-02-08 DIAGNOSIS — I255 Ischemic cardiomyopathy: Secondary | ICD-10-CM | POA: Diagnosis present

## 2019-02-08 LAB — URINALYSIS, ROUTINE W REFLEX MICROSCOPIC
Bilirubin Urine: NEGATIVE
Glucose, UA: NEGATIVE mg/dL
Ketones, ur: NEGATIVE mg/dL
Nitrite: NEGATIVE
Protein, ur: 30 mg/dL — AB
RBC / HPF: >50 RBC/hpf — ABNORMAL HIGH (ref 0–5)
Specific Gravity, Urine: 1.015 (ref 1.005–1.030)
pH: 5 (ref 5.0–8.0)

## 2019-02-08 LAB — AMMONIA: Ammonia: 37 umol/L — ABNORMAL HIGH (ref 9–35)

## 2019-02-08 LAB — I-STAT TROPONIN, ED: Troponin i, poc: 0.1 ng/mL (ref 0.00–0.08)

## 2019-02-08 LAB — COMPREHENSIVE METABOLIC PANEL
ALT: 623 U/L — AB (ref 0–44)
AST: 314 U/L — ABNORMAL HIGH (ref 15–41)
Albumin: 3.2 g/dL — ABNORMAL LOW (ref 3.5–5.0)
Alkaline Phosphatase: 78 U/L (ref 38–126)
Anion gap: 10 (ref 5–15)
BUN: 74 mg/dL — ABNORMAL HIGH (ref 8–23)
CO2: 22 mmol/L (ref 22–32)
Calcium: 8.9 mg/dL (ref 8.9–10.3)
Chloride: 97 mmol/L — ABNORMAL LOW (ref 98–111)
Creatinine, Ser: 2.61 mg/dL — ABNORMAL HIGH (ref 0.61–1.24)
GFR calc Af Amer: 26 mL/min — ABNORMAL LOW (ref >60)
GFR calc non Af Amer: 22 mL/min — ABNORMAL LOW (ref >60)
Glucose, Bld: 174 mg/dL — ABNORMAL HIGH (ref 70–99)
Potassium: 3.2 mmol/L — ABNORMAL LOW (ref 3.5–5.1)
Sodium: 129 mmol/L — ABNORMAL LOW (ref 135–145)
Total Bilirubin: 1.3 mg/dL — ABNORMAL HIGH (ref 0.3–1.2)
Total Protein: 6.5 g/dL (ref 6.5–8.1)

## 2019-02-08 LAB — CBC WITH DIFFERENTIAL/PLATELET
Abs Immature Granulocytes: 0.22 10*3/uL — ABNORMAL HIGH (ref 0.00–0.07)
Basophils Absolute: 0 10*3/uL (ref 0.0–0.1)
Basophils Relative: 0 %
EOS PCT: 0 %
Eosinophils Absolute: 0 10*3/uL (ref 0.0–0.5)
HCT: 22.2 % — ABNORMAL LOW (ref 39.0–52.0)
Hemoglobin: 7.1 g/dL — ABNORMAL LOW (ref 13.0–17.0)
Immature Granulocytes: 4 %
Lymphocytes Relative: 10 %
Lymphs Abs: 0.6 10*3/uL — ABNORMAL LOW (ref 0.7–4.0)
MCH: 31.8 pg (ref 26.0–34.0)
MCHC: 32 g/dL (ref 30.0–36.0)
MCV: 99.6 fL (ref 80.0–100.0)
MONOS PCT: 7 %
Monocytes Absolute: 0.4 10*3/uL (ref 0.1–1.0)
Neutro Abs: 4.6 10*3/uL (ref 1.7–7.7)
Neutrophils Relative %: 79 %
Platelets: 50 10*3/uL — ABNORMAL LOW (ref 150–400)
RBC: 2.23 MIL/uL — ABNORMAL LOW (ref 4.22–5.81)
RDW: 21.2 % — ABNORMAL HIGH (ref 11.5–15.5)
WBC: 5.9 10*3/uL (ref 4.0–10.5)
nRBC: 0 % (ref 0.0–0.2)

## 2019-02-08 LAB — RAPID URINE DRUG SCREEN, HOSP PERFORMED
Amphetamines: NOT DETECTED
BARBITURATES: NOT DETECTED
Benzodiazepines: NOT DETECTED
Cocaine: NOT DETECTED
Opiates: POSITIVE — AB
Tetrahydrocannabinol: POSITIVE — AB

## 2019-02-08 LAB — MAGNESIUM: Magnesium: 3 mg/dL — ABNORMAL HIGH (ref 1.7–2.4)

## 2019-02-08 LAB — LACTIC ACID, PLASMA: Lactic Acid, Venous: 1.5 mmol/L (ref 0.5–1.9)

## 2019-02-08 LAB — TROPONIN I: Troponin I: 0.06 ng/mL (ref <0.03)

## 2019-02-08 LAB — ETHANOL: Alcohol, Ethyl (B): <10 mg/dL (ref <10)

## 2019-02-08 LAB — CK: Total CK: 395 U/L (ref 49–397)

## 2019-02-08 LAB — PHOSPHORUS: Phosphorus: 4.5 mg/dL (ref 2.5–4.6)

## 2019-02-08 MED ORDER — OXYCODONE-ACETAMINOPHEN 5-325 MG PO TABS
1.0000 | ORAL_TABLET | ORAL | Status: DC | PRN
  Administered 2019-02-08 – 2019-02-15 (×10): 2 via ORAL
  Administered 2019-02-16: 1 via ORAL
  Administered 2019-02-17 (×4): 2 via ORAL
  Filled 2019-02-08 (×11): qty 2
  Filled 2019-02-08: qty 1
  Filled 2019-02-08 (×5): qty 2

## 2019-02-08 MED ORDER — SODIUM CHLORIDE 0.9 % IV SOLN
INTRAVENOUS | Status: DC | PRN
  Administered 2019-02-08: 1000 mL via INTRAVENOUS
  Administered 2019-02-08 – 2019-02-16 (×5): 250 mL via INTRAVENOUS

## 2019-02-08 MED ORDER — SODIUM CHLORIDE 0.9 % IV SOLN
1.0000 g | Freq: Once | INTRAVENOUS | Status: AC
  Administered 2019-02-08: 1 g via INTRAVENOUS
  Filled 2019-02-08: qty 10

## 2019-02-08 MED ORDER — PAROXETINE HCL 10 MG PO TABS
10.0000 mg | ORAL_TABLET | Freq: Every day | ORAL | Status: DC
  Administered 2019-02-09 – 2019-02-18 (×10): 10 mg via ORAL
  Filled 2019-02-08 (×10): qty 1

## 2019-02-08 MED ORDER — ONDANSETRON HCL 4 MG/2ML IJ SOLN: 4.0000 mg | Freq: Four times a day (QID) | INTRAMUSCULAR | Status: DC | PRN

## 2019-02-08 MED ORDER — SODIUM CHLORIDE 0.9 % IV BOLUS
1000.0000 mL | Freq: Once | INTRAVENOUS | Status: AC
  Administered 2019-02-08: 1000 mL via INTRAVENOUS

## 2019-02-08 MED ORDER — LORAZEPAM 2 MG/ML IJ SOLN
1.0000 mg | Freq: Once | INTRAMUSCULAR | Status: AC
  Administered 2019-02-08: 1 mg via INTRAVENOUS
  Filled 2019-02-08: qty 1

## 2019-02-08 MED ORDER — OXYCODONE-ACETAMINOPHEN 10-325 MG PO TABS: 1.0000 | ORAL_TABLET | ORAL | Status: DC | PRN

## 2019-02-08 MED ORDER — ACETAMINOPHEN 325 MG PO TABS: 650.0000 mg | ORAL_TABLET | Freq: Four times a day (QID) | ORAL | Status: DC | PRN

## 2019-02-08 MED ORDER — HYDROCODONE-ACETAMINOPHEN 5-325 MG PO TABS: 1.0000 | ORAL_TABLET | ORAL | Status: DC | PRN

## 2019-02-08 MED ORDER — ACETAMINOPHEN 650 MG RE SUPP: 650.0000 mg | Freq: Four times a day (QID) | RECTAL | Status: DC | PRN

## 2019-02-08 MED ORDER — POTASSIUM CHLORIDE 10 MEQ/100ML IV SOLN
10.0000 meq | INTRAVENOUS | Status: AC
  Administered 2019-02-08: 10 meq via INTRAVENOUS
  Filled 2019-02-08: qty 100

## 2019-02-08 MED ORDER — MIRABEGRON ER 25 MG PO TB24
25.0000 mg | ORAL_TABLET | Freq: Every day | ORAL | Status: DC
  Administered 2019-02-09 – 2019-02-21 (×13): 25 mg via ORAL
  Filled 2019-02-08 (×13): qty 1

## 2019-02-08 MED ORDER — SODIUM CHLORIDE 0.9 % IV SOLN
1.0000 g | INTRAVENOUS | Status: DC
  Administered 2019-02-09: 1 g via INTRAVENOUS
  Filled 2019-02-08: qty 10
  Filled 2019-02-08: qty 1

## 2019-02-08 MED ORDER — ONDANSETRON HCL 4 MG PO TABS: 4.0000 mg | ORAL_TABLET | Freq: Four times a day (QID) | ORAL | Status: DC | PRN

## 2019-02-08 MED ORDER — OXYCODONE HCL 5 MG PO TABS
5.0000 mg | ORAL_TABLET | ORAL | Status: DC | PRN
  Administered 2019-02-09 – 2019-02-11 (×2): 10 mg via ORAL
  Administered 2019-02-16: 5 mg via ORAL
  Filled 2019-02-08 (×3): qty 2
  Filled 2019-02-08: qty 1
  Filled 2019-02-08: qty 2

## 2019-02-08 MED ORDER — HALOPERIDOL LACTATE 5 MG/ML IJ SOLN
2.0000 mg | Freq: Once | INTRAMUSCULAR | Status: AC
  Administered 2019-02-08: 2 mg via INTRAVENOUS
  Filled 2019-02-08: qty 1

## 2019-02-08 MED ORDER — PRAMIPEXOLE DIHYDROCHLORIDE 1 MG PO TABS
2.5000 mg | ORAL_TABLET | Freq: Every evening | ORAL | Status: DC
  Administered 2019-02-08: 2.5 mg via ORAL
  Filled 2019-02-08 (×2): qty 2

## 2019-02-08 MED ORDER — OLANZAPINE 5 MG PO TABS
5.0000 mg | ORAL_TABLET | Freq: Every day | ORAL | Status: DC
  Administered 2019-02-08: 5 mg via ORAL
  Filled 2019-02-08: qty 1

## 2019-02-08 MED ORDER — FLUCONAZOLE 150 MG PO TABS
150.0000 mg | ORAL_TABLET | Freq: Once | ORAL | Status: DC
  Filled 2019-02-08: qty 1

## 2019-02-08 MED ORDER — HALOPERIDOL 1 MG PO TABS
1.0000 mg | ORAL_TABLET | Freq: Every evening | ORAL | Status: DC | PRN
  Filled 2019-02-08: qty 1

## 2019-02-08 MED ORDER — SODIUM CHLORIDE 0.9 % IV SOLN
500.0000 mg | Freq: Once | INTRAVENOUS | Status: AC
  Administered 2019-02-08: 500 mg via INTRAVENOUS
  Filled 2019-02-08: qty 500

## 2019-02-08 NOTE — ED Notes (Signed)
Attempted to obtain blood from right [facility] but was unsuccessful. DJ attempted twice on his left upper extremity with no success. Also, will hold on Diflucan until patient is more alert. Patient is sleeping with even, regular respirations.

## 2019-02-08 NOTE — H&P (Signed)
Charles Coffey JJH:[id number] DOB: 09/08/1935 DOA: 02/08/2019     PCP: Charles Morale, MD   Outpatient Specialists:   CARDS:   Dr. Cathie Coffey    NEurology     Dr. Delice Coffey    Oncology   Dr. Alen Coffey   Urology Dr. Jeffie Coffey  Patient arrived to ER on 02/08/19 at 1415  Patient coming from: home Lives      With family  Chief Complaint:  Chief Complaint  Patient presents with  . Psychiatric Evaluation  . IVC    HPI: Charles Coffey is a 84 y.o. male with medical history significant of CAD s/p CABG, GERD, metastatic bladder cancer status post suprapubic catheter, nephrostomy tubes, ostomy, dementia with  sundowning    Presented with   agitation and homoccidal ideations.  Patient recently started Bactrim for UTI.  Early in the week she was treated with Augmentin after was found to be febrile up to 102 he was seen by urology and urine sample was taken.  He continued to be febrile Monday Tuesday and Wednesday.  Finally has improved 1 week ago.  He has been having worsening confusion while febrile.  Decreased urinary output.  He actually stopped using his Lasix in the past few days. Patient has been having sundowning and was seen by oncologist who prescribed  Haldol 1 mg instructed to take 1-2 before bedtime.  Yesterday seemed to help with agitation. He has been getting gradually weaker has been having some shortness of breath Family  given  Him a small THC cookie he slept all day and became very agitated. Chasing them with knives. Trying to take out his ostomy bag not understanding why its there. Police was then called and patient was tased by the police.   Regarding pertinent Chronic problems:  History of urothelial carcinoma of the bladder family and patient has been agreeable to proceed with hospice after his nephrostomy tubes were removed and exchanged for stents.  Plan was for oncology to reach out to his  urologist Dr. Jeffie Coffey  She has longstanding anemia with hemoglobin around 7.3 his  oncology has offered a transfusion the patient had declined  Last blood transfusion was 20 January 2019  Hx of CAd  coronary artery bypass grafting in 2002.  He has an ischemic cardiomyopathy, hypertension, hyperlipidemia. Cath in Aug. 2018.   Has severe native CAD Patent LIMA to LAD and patent SVG to OM2.    CHF - Echo in Aug. 2019 - showed EF 25-30%.  prior EF was 50-55% by echo in Sept. 2018   while in ER: Was IVC'd Noted to be somewhat somnolent blood pressure down 99/55 Troponin is 0.10 which is around his baseline  The following Work up has been ordered so far:  Orders Placed This Encounter  Procedures  . Urine culture  . Blood culture (routine x 2)  . CT Head Wo Contrast  . CT Renal Stone Study  . DG Chest 2 View  . CBC with Differential/Platelet  . Comprehensive metabolic panel  . Urinalysis, Routine w reflex microscopic  . Ethanol  . Rapid urine drug screen (hospital performed)  . Lactic acid, plasma  . Consult to care management  . Consult to hospitalist  . I-stat troponin, ED  . EKG 12-Lead     Following Medications were ordered in ER: Medications  cefTRIAXone (ROCEPHIN) 1 g in sodium chloride 0.9 % 100 mL IVPB (1 g Intravenous New Bag/Given 02/08/19 1825)  azithromycin (ZITHROMAX) 500 mg in sodium  chloride 0.9 % 250 mL IVPB (has no administration in time range)  fluconazole (DIFLUCAN) tablet 150 mg (has no administration in time range)  0.9 %  sodium chloride infusion (1,000 mLs Intravenous New Bag/Given 02/08/19 1824)  LORazepam (ATIVAN) injection 1 mg (1 mg Intravenous Given 02/08/19 1611)  sodium chloride 0.9 % bolus 1,000 mL (0 mLs Intravenous Stopped 02/08/19 1827)    Significant initial  Findings: Abnormal Labs Reviewed  CBC WITH DIFFERENTIAL/PLATELET - Abnormal; Notable for the following components:      Result Value   RBC 2.23 (*)    Hemoglobin 7.1 (*)    HCT 22.2 (*)    RDW 21.2 (*)    Platelets 50 (*)    Lymphs Abs 0.6 (*)    Abs Immature  Granulocytes 0.22 (*)    All other components within normal limits  COMPREHENSIVE METABOLIC PANEL - Abnormal; Notable for the following components:   Sodium 129 (*)    Potassium 3.2 (*)    Chloride 97 (*)    Glucose, Bld 174 (*)    BUN 74 (*)    Creatinine, Ser 2.61 (*)    Albumin 3.2 (*)    AST 314 (*)    ALT 623 (*)    Total Bilirubin 1.3 (*)    GFR calc non Af Amer 22 (*)    GFR calc Af Amer 26 (*)    All other components within normal limits  URINALYSIS, ROUTINE W REFLEX MICROSCOPIC - Abnormal; Notable for the following components:   APPearance CLOUDY (*)    Hgb urine dipstick LARGE (*)    Protein, ur 30 (*)    Leukocytes,Ua LARGE (*)    RBC / HPF >50 (*)    WBC, UA >50 (*)    Bacteria, UA RARE (*)    All other components within normal limits  RAPID URINE DRUG SCREEN, HOSP PERFORMED - Abnormal; Notable for the following components:   Opiates POSITIVE (*)    Tetrahydrocannabinol POSITIVE (*)    All other components within normal limits  I-STAT TROPONIN, ED - Abnormal; Notable for the following components:   Troponin i, poc 0.10 (*)    All other components within normal limits     Lactic Acid, Venous    Component Value Date/Time   LATICACIDVEN 1.0 11/07/2018 0123    Na  129 K 3.2  Cr   stable,    Lab Results  Component Value Date   CREATININE 2.61 (H) 02/08/2019   CREATININE 2.26 (H) 02/03/2019   CREATININE 2.41 (H) 01/20/2019  BUN 75 up from prior    WBC 5.9  HG/HCT  Stable,     Component Value Date/Time   HGB 7.1 (L) 02/08/2019 1610   HGB 7.3 (L) 02/03/2019 1037   HGB 8.2 (L) 11/14/2018 1656   HGB 8.8 (L) 12/29/2017 1014   HCT 22.2 (L) 02/08/2019 1610   HCT 24.4 (L) 11/14/2018 1656   HCT 26.0 (L) 12/29/2017 1014       Troponin (Point of Care Test) stable Recent Labs    02/08/19 1620  TROPIPOC 0.10*   Tox positive for opioids and THC    BNP (last 3 results) Recent Labs    11/25/18 1634  BNP 2,117.1*    ProBNP (last 3  results) No results for input(s): PROBNP in the last 8760 hours.     UA both elevated RBCs and WBCs with rare bacteria  CT HEAD   NON acute  CXR - NON acute megaly interstitial  edema cannot exclude pneumonia slightly more confluent airspace opacity in the right perihilar region  CT renal  -  Non-acute bilateral nephrostomy tubes and stents prevent place biLateral pleural effusions right greater than left   ECG:  Personally reviewed by me showing: HR : 77 Rhythm: RBBB,    no evidence of ischemic changes QTC 457      ED Triage Vitals  Enc Vitals Group     BP 02/08/19 1433 123/78     Pulse Rate 02/08/19 1433 81     Resp 02/08/19 1433 20     Temp 02/08/19 1433 (!) 97.4 F (36.3 C)     Temp Source 02/08/19 1433 Oral     SpO2 02/08/19 1433 94 %     Weight 02/08/19 1444 193 lb 9 oz (87.8 kg)     Height 02/08/19 1444 5\' 8"  (1.727 m)     Head Circumference --      Peak Flow --      Pain Score 02/08/19 1444 0     Pain Loc --      Pain Edu? --      Excl. in [place]? --   TMAX(24)@       Latest  Blood pressure 98/68, pulse 70, temperature (!) 97.4 F (36.3 C), temperature source Oral, resp. rate 20, height 5\' 8"  (1.727 m), weight 87.8 kg, SpO2 98 %.      Hospitalist was called for admission for acute encephalopathy elevated troponin   Review of Systems:    Pertinent positives include:  Fatigue, lethargy agitation  Constitutional:  No weight loss, night sweats, Fevers, chills, weight loss  HEENT:  No headaches, Difficulty swallowing,Tooth/dental problems,Sore throat,  No sneezing, itching, ear ache, nasal congestion, post nasal drip,  Cardio-vascular:  No chest pain, Orthopnea, PND, anasarca, dizziness, palpitations.no Bilateral lower extremity swelling  GI:  No heartburn, indigestion, abdominal pain, nausea, vomiting, diarrhea, change in bowel habits, loss of appetite, melena, blood in stool, hematemesis Resp:  no shortness of breath at rest. No dyspnea on exertion,  No excess mucus, no productive cough, No non-productive cough, No coughing up of blood.No change in color of mucus.No wheezing. Skin:  no rash or lesions. No jaundice GU:  no dysuria, change in color of urine, no urgency or frequency. No straining to urinate.  No flank pain.  Musculoskeletal:  No joint pain or no joint swelling. No decreased range of motion. No back pain.  Psych:  No change in mood or affect. No depression or anxiety. No memory loss.  Neuro: no localizing neurological complaints, no tingling, no weakness, no double vision, no gait abnormality, no slurred speech, no confusion  All systems reviewed and apart from [place] all are negative  Past Medical History:   Past Medical History:  Diagnosis Date  . Aortic atherosclerosis ([place])   . BPH with urinary obstruction   . CAD (coronary artery disease)    a.  MI 1995, CABG x 3 2002 (patient says that he had LIMA and RIMA grafts). b. ETT-Cardiolite (10/15) with EF 48%, apical scar, no ischemia. c. Nuc 07/2017 abnormal -> cath was performed,  patent LIMA to LAD and SVG to OM 2. RIMA to RCA is atretic. The right coronary artery is occluded distally with extensive collaterals from the LAD, medical therapy.   . Cancer [facility])    bladder cancer  . Cervical spondylosis without myelopathy 02/20/202017  . Chronic low back pain    sees Dr. Kary Kos   . GERD (gastroesophageal  reflux disease)   . Hiatal hernia   . Hyperlipidemia   . Hypertension   . IBS (irritable bowel syndrome)   . Ischemic cardiomyopathy   . Myocardial infarction ([place])   . RBBB   . Restless legs syndrome   . Statin intolerance   . Syncope    a. in 2015 - no apparent cause, was taking sleep medicine at the time. Cardiac workup unremarkable.  . Tubular adenoma of colon       Past Surgical History:  Procedure Laterality Date  . COLONOSCOPY  10/17/2014   per Dr. Hilarie Fredrickson, tubular adenomas, repeat in 3 yrs   . COLONOSCOPY WITH PROPOFOL N/A 12/01/2017   Procedure:  COLONOSCOPY WITH PROPOFOL;  Surgeon: Milus Banister, MD;  Location: [facility];  Service: Endoscopy;  Laterality: N/A;  . CYSTOSCOPY W/ RETROGRADES Left 11/25/2017   Procedure: CYSTOSCOPY WITH RETROGRADE PYELOGRAM;  Surgeon: Irine Seal, MD;  Location: [facility];  Service: Urology;  Laterality: Left;  . HEART BYPASS    . IR CATHETER TUBE CHANGE  11/08/2018  . IR CATHETER TUBE CHANGE  12/26/2018  . IR CATHETER TUBE CHANGE  01/08/2019  . IR CATHETER TUBE CHANGE  01/13/2019  . IR CONVERT LEFT NEPHROSTOMY TO NEPHROURETERAL CATH  09/20/2018  . IR CONVERT RIGHT NEPHROSTOMY TO NEPHROURETERAL CATH  02/04/2018  . IR CONVERT RIGHT NEPHROSTOMY TO NEPHROURETERAL CATH  08/23/2018  . IR EXT NEPHROURETERAL CATH EXCHANGE  04/11/2018  . IR EXT NEPHROURETERAL CATH EXCHANGE  07/04/2018  . IR EXT NEPHROURETERAL CATH EXCHANGE  09/20/2018  . IR EXT NEPHROURETERAL CATH EXCHANGE  11/08/2018  . IR EXT NEPHROURETERAL CATH EXCHANGE  11/08/2018  . IR EXT NEPHROURETERAL CATH EXCHANGE  12/26/2018  . IR EXT NEPHROURETERAL CATH EXCHANGE  12/26/2018  . IR EXT NEPHROURETERAL CATH EXCHANGE  01/17/2019  . IR FLUORO GUIDE CV LINE RIGHT  12/27/2017  . IR FLUORO GUIDED NEEDLE PLC ASPIRATION/INJECTION LOC  08/19/2018  . IR NEPHROSTOGRAM LEFT THRU EXISTING ACCESS  12/24/2017  . IR NEPHROSTOMY EXCHANGE LEFT  01/28/2018  . IR NEPHROSTOMY EXCHANGE LEFT  02/04/2018  . IR NEPHROSTOMY EXCHANGE LEFT  02/25/2018  . IR NEPHROSTOMY EXCHANGE LEFT  07/04/2018  . IR NEPHROSTOMY EXCHANGE LEFT  07/05/2018  . IR NEPHROSTOMY EXCHANGE LEFT  08/09/2018  . IR NEPHROSTOMY EXCHANGE LEFT  08/16/2018  . IR NEPHROSTOMY EXCHANGE LEFT  08/23/2018  . IR NEPHROSTOMY EXCHANGE RIGHT  12/24/2017  . IR NEPHROSTOMY EXCHANGE RIGHT  01/28/2018  . IR NEPHROSTOMY EXCHANGE RIGHT  04/11/2018  . IR NEPHROSTOMY EXCHANGE RIGHT  07/05/2018  . IR NEPHROSTOMY EXCHANGE RIGHT  08/09/2018  . IR NEPHROSTOMY EXCHANGE RIGHT  08/19/2018  . IR NEPHROSTOMY PLACEMENT LEFT  11/28/2017  . IR NEPHROSTOMY  PLACEMENT RIGHT  12/03/2017  . IR PATIENT EVAL TECH 0-60 MINS  03/24/2018  . IR PATIENT EVAL TECH 0-60 MINS  03/28/2018  . IR PATIENT EVAL TECH 0-60 MINS  04/25/2018  . IR PATIENT EVAL TECH 0-60 MINS  06/20/2018  . IR US GUIDE VASC ACCESS RIGHT  12/27/2017  . LAPAROSCOPY N/A 12/01/2017   Procedure: LAPAROSCOPIC DIVERTING OSTOMY;  Surgeon: Stark Klein, MD;  Location: [facility];  Service: General;  Laterality: N/A;  . LEFT HEART CATH AND CORS/GRAFTS ANGIOGRAPHY N/A 08/25/2017   Procedure: LEFT HEART CATH AND CORS/GRAFTS ANGIOGRAPHY;  Surgeon: Wellington Hampshire, MD;  Location: [place] CV LAB;  Service: Cardiovascular;  Laterality: N/A;  . LUMBAR FUSION  2003   L3-L4  . PROSTATE SURGERY  06-27-12  per Dr. Roni Bread, had CTT  . TONSILLECTOMY    . TRANSURETHRAL RESECTION OF BLADDER TUMOR N/A 11/25/2017   Procedure: TRANSURETHRAL RESECTION OF BLADDER TUMOR (TURBT);  Surgeon: Irine Seal, MD;  Location: [facility];  Service: Urology;  Laterality: N/A;    Social History:  Ambulatory   independently      reports that he quit smoking about 40 years ago. His smoking use included cigarettes. He has never used smokeless tobacco. He reports previous alcohol use of about 2.0 standard drinks of alcohol per week. He reports that he does not use drugs.     Family History:   Family History  Problem Relation Age of Onset  . Heart attack Mother   . Aneurysm Father        femoral artery  . Heart disease Brother   . Heart disease Maternal Uncle        x 2  . Colon cancer Neg Hx   . Esophageal cancer Neg Hx   . Pancreatic cancer Neg Hx   . Kidney disease Neg Hx   . Liver disease Neg Hx     Allergies: Allergies  Allergen Reactions  . Statins Other (See Comments)    liver effects  . Cyproheptadine Other (See Comments)    Unable to urinate     Prior to Admission medications   Medication Sig Start Date End Date Taking? Authorizing Provider  carvedilol (COREG) 3.125 MG tablet Take 3.125 mg by mouth 2  (two) times daily with a meal.   Yes [provider]  Cyanocobalamin (VITAMIN B 12 PO) Take 1 tablet by mouth daily.   Yes [provider]  famotidine (PEPCID) 20 MG tablet TAKE 1 TABLET BY MOUTH TWICE A DAY 02/02/19  Yes Wyatt Portela, MD  feeding supplement (BOOST HIGH PROTEIN) LIQD Take 1 Container by mouth 2 (two) times daily between meals.   Yes [provider]  furosemide (LASIX) 40 MG tablet Take 40 mg by mouth 3 (three) times daily.    Yes [provider]  haloperidol (HALDOL) 1 MG tablet 1 to 2 PO QHS 02/03/19  Yes Tanner, Lyndon Code., PA-C  hydrOXYzine (ATARAX/VISTARIL) 25 MG tablet Take 25 mg by mouth every 6 (six) hours as needed for itching.   Yes [provider]  lidocaine-prilocaine (EMLA) cream Apply 1 application topically as needed. 10/18/18  Yes Wyatt Portela, MD  mirabegron ER (MYRBETRIQ) 25 MG TB24 tablet Take 25 mg by mouth daily.   Yes [provider]  nitroGLYCERIN (NITROSTAT) 0.4 MG SL tablet PLACE 1 TABLET (0.4 MG TOTAL) UNDER THE TONGUE EVERY 5 (FIVE) MINUTES AS NEEDED. 09/27/18 02/08/19 Yes Dunn, Dayna N, PA-C  nystatin (NYSTATIN) powder Apply topically as needed (itching, raw wound).   Yes [provider]  OLANZapine (ZYPREXA) 5 MG tablet Take 1 tablet (5 mg total) by mouth daily. Between 5-6 pm Patient taking differently: Take 2.5-5 mg by mouth daily. Between 5-6 pm 01/11/19  Yes Charles Morale, MD  oxyCODONE (OXYCONTIN) 10 mg 12 hr tablet Take 10 mg by mouth every 12 (twelve) hours as needed (pain).   Yes [provider]  oxyCODONE-acetaminophen (PERCOCET) 10-325 MG tablet Take 1 tablet by mouth every 4 (four) hours as needed for pain. Patient taking differently: Take 1-2 tablets by mouth every 4 (four) hours as needed for pain.  01/20/19  Yes Tanner, Lyndon Code., PA-C  PARoxetine (PAXIL) 10 MG tablet Take 1 tablet (10 mg total) by mouth daily. 11/29/18  Yes Cameron Sprang, MD  pramipexole (MIRAPEX) 1 MG tablet  TAKE 1 AND 1/2 TABLETS BY MOUTH AT BEDTIME Patient taking differently: Take 2.5 mg by mouth every evening.  06/29/18  Yes Charles Morale, MD  Probiotic Product (PROBIOTIC PO) Take 1 tablet by mouth daily.   Yes [provider]  sulfamethoxazole-trimethoprim (BACTRIM DS,SEPTRA DS) 800-160 MG tablet Take 1 tablet by mouth 2 (two) times daily. 02/06/19  Yes [provider]  traMADol (ULTRAM) 50 MG tablet Take 1 tablet (50 mg total) by mouth every 6 (six) hours as needed. 01/20/19  Yes Tanner, Lyndon Code., PA-C  amoxicillin-clavulanate (AUGMENTIN) 875-125 MG tablet Take 1 tablet by mouth 2 (two) times daily. Patient not taking: Reported on 02/08/2019 01/30/19   Sandi Mealy E., PA-C  triamcinolone cream (KENALOG) 0.1 % Apply topically 2 (two) times daily as needed. To the ostomy site for itching Patient not taking: Reported on 02/08/2019 01/11/19   Charles Morale, MD   Physical Exam: Blood pressure 98/68, pulse 70, temperature (!) 97.4 F (36.3 C), temperature source Oral, resp. rate 20, height 5\' 8"  (1.727 m), weight 87.8 kg, SpO2 98 %. 1. General:  in No  Acute distress   Chronically ill  -appearing 2. Psychological: Alert and Oriented 3. Head/ENT:    Dry Mucous Membranes                          Head Non traumatic, neck supple                            Poor Dentition 4. SKIN: normal  Skin turgor,  Skin clean Dry and intact no rash 5. Heart: Regular rate and rhythm mild Murmur, no Rub or gallop 6. Lungs:  Clear to auscultation bilaterally, no wheezes or crackles   7. Abdomen: Soft,  non-tender, Non distended  obese  bowel sounds present ostomy in place with slightly bulging ostomy site 8. Lower extremities: no clubbing, cyanosis, 1+edema 9. Neurologically Grossly intact, moving all 4 extremities equally   10. MSK: Normal range of motion   LABS:     Recent Labs  Lab 02/03/19 1037 02/08/19 1610  WBC 6.5 5.9  NEUTROABS 4.6 4.6  HGB 7.3* 7.1*  HCT 22.3* 22.2*  MCV 97.8 99.6  PLT  50* 50*   Basic Metabolic Panel: Recent Labs  Lab 02/03/19 1037 02/08/19 1610  NA 130* 129*  K 3.9 3.2*  CL 96* 97*  CO2 21* 22  GLUCOSE 121* 174*  BUN 57* 74*  CREATININE 2.26* 2.61*  CALCIUM 9.0 8.9      Recent Labs  Lab 02/03/19 1037 02/08/19 1610  AST 37 314*  ALT 31 623*  ALKPHOS 66 78  BILITOT 1.7* 1.3*  PROT 6.9 6.5  ALBUMIN 3.2* 3.2*   No results for input(s): LIPASE, AMYLASE in the last 168 hours. No results for input(s): AMMONIA in the last 168 hours.    HbA1C: No results for input(s): HGBA1C in the last 72 hours. CBG: No results for input(s): GLUCAP in the last 168 hours.    Urine analysis:    Component Value Date/Time   COLORURINE YELLOW 02/08/2019 1557   APPEARANCEUR CLOUDY (A) 02/08/2019 1557   LABSPEC 1.015 02/08/2019 1557   PHURINE 5.0 02/08/2019 1557   GLUCOSEU NEGATIVE 02/08/2019 1557   HGBUR LARGE (A) 02/08/2019 1557   HGBUR negative 10/15/2010 1251   BILIRUBINUR NEGATIVE 02/08/2019 1557  BILIRUBINUR n 12/01/2016 1521   KETONESUR NEGATIVE 02/08/2019 1557   PROTEINUR 30 (A) 02/08/2019 1557   UROBILINOGEN 0.2 12/01/2016 1521   UROBILINOGEN 0.2 10/15/2010 1251   NITRITE NEGATIVE 02/08/2019 1557   LEUKOCYTESUR LARGE (A) 02/08/2019 1557       Cultures:    Component Value Date/Time   SDES  01/08/2019 1017    URINE, CLEAN CATCH Performed at [facility], [place] [address], [place] [address]    SPECREQUEST  01/08/2019 1017    NONE Performed at [facility], [place] [address], [place], [place] [address]    CULT MULTIPLE SPECIES PRESENT, SUGGEST RECOLLECTION (A) 01/08/2019 1017   REPTSTATUS 01/09/2019 FINAL 01/08/2019 1017     Radiological Exams on Admission: Dg Chest 2 View  Result Date: 02/08/2019 CLINICAL DATA:  Altered mentation and confusion. EXAM: CHEST - 2 VIEW COMPARISON:  11/25/2018 FINDINGS: Stable cardiomegaly with aortic atherosclerosis and post CABG change. Low lung volumes  with interstitial edema is noted. Slightly more confluent right perihilar airspace disease is identified. Pneumonia is not excluded versus atelectasis. No significant effusion or pneumothorax. Port catheter tip terminates in the distal SVC. IMPRESSION: 1. Cardiomegaly with aortic atherosclerosis and post CABG change. 2. Low lung volumes with interstitial edema. 3. Slightly more confluent airspace opacity in the right perihilar region. Pneumonia is not excluded. Electronically Signed   By: Ashley Royalty M.D.   On: 02/08/2019 16:44   Ct Head Wo Contrast  Result Date: 02/08/2019 CLINICAL DATA:  Altered level of consciousness, sundowning, history coronary artery disease post CABG and MI, ischemic cardiomyopathy, hypertension, bladder cancer EXAM: CT HEAD WITHOUT CONTRAST TECHNIQUE: Contiguous axial images were obtained from the base of the skull through the vertex without intravenous contrast. Sagittal and coronal MPR images reconstructed from axial data set. COMPARISON:  None FINDINGS: Brain: Generalized atrophy. Normal ventricular morphology. No midline shift or mass effect. Small vessel chronic ischemic changes of deep cerebral white matter. No intracranial hemorrhage, mass lesion, evidence of acute infarction, or extra-axial fluid collection. Vascular: Atherosclerotic calcifications of internal carotid arteries at skull base Skull: Intact Sinuses/Orbits: Clear Other: N/A IMPRESSION: Atrophy with small vessel chronic ischemic changes of deep cerebral white matter. No acute intracranial abnormalities. Electronically Signed   By: Lavonia Dana M.D.   On: 02/08/2019 17:06   Ct Renal Stone Study  Result Date: 02/08/2019 CLINICAL DATA:  Hematuria, suspected renal parenchymal cause, UTI treated with Bactrim this week, history coronary artery disease post MI and CABG, bladder cancer, hypertension, ischemic cardiomyopathy, GERD, CHF, former smoker EXAM: CT ABDOMEN AND PELVIS WITHOUT CONTRAST TECHNIQUE: Multidetector CT  imaging of the abdomen and pelvis was performed following the standard protocol without IV contrast. Sagittal and coronal MPR images reconstructed from axial data set. Oral contrast was not administered. COMPARISON:  11/06/2018 FINDINGS: Lower chest: BILATERAL pleural effusions, small LEFT and moderate RIGHT. Peribronchial thickening. Minimal atelectasis RIGHT lung base. Hepatobiliary: Gallbladder and liver normal appearance Pancreas: Normal appearance Spleen: Normal appearance Adrenals/Urinary Tract: Adrenal glands normal appearance. Atrophic LEFT kidney. BILATERAL nephrostomy tubes. No definite renal mass. Mild LEFT renal collecting system dilatation. BILATERAL ureteral stents. Mildly thickened bladder wall question due to tumor, cystitis or chronic outlet obstruction suprapubic catheter present. Stomach/Bowel: Transverse double-barrel colostomy. Stool in RIGHT colon. Decompressed distal colon. Stomach and small bowel loops unremarkable. Vascular/Lymphatic: Atherosclerotic calcifications aorta, iliac arteries, coronary arteries. Aorta normal caliber. No definite abdominal or pelvic adenopathy. Reproductive: Minimal prostatic enlargement. Other: LEFT inguinal hernia containing fat. Scattered subcutaneous and  intra-abdominal tissue edema. No free air or free fluid. Minimal presacral stranding. Musculoskeletal: Bones demineralized with prior L3-L4 posterior fusion scattered degenerative disc disease changes. IMPRESSION: BILATERAL nephrostomy tubes and ureteral stents with mild persistent dilatation of the LEFT renal collecting system. Bladder wall thickening which could reflect tumor, L obstruction or cystitis. Small LEFT inguinal hernia containing fat. BILATERAL pleural effusions RIGHT greater than LEFT. Electronically Signed   By: Lavonia Dana M.D.   On: 02/08/2019 17:13    Chart has been reviewed    Assessment/Plan  84 y.o. male with medical history significant of CAD s/p CABG, GERD, metastatic bladder  cancer status post suprapubic catheter, nephrostomy tubes, ostomy, dementia with  sundowning Admitted for CAP and acute encephalopathy  Present on Admission: Acute encephalopathy with  Agitation/ Delirium In the setting of chronic dementia   - most likely multifactorial secondary to combination of  Infection  mild dehydration secondary to decreased by mouth intake,  polypharmacy as well as administration of THC  - Will rehydrate   - treat underlining infection   - Hold contributing medications   - if no improvement may need further imaging to evaluate for CNS pathology pathology such as MRI of the brain CT unremarkable  - neurological exam appears to be nonfocal but patient unable to cooperate fully      - no history of liver disease ammonia unremarkable  Patient was a risk to himself and others.  IVC paperwork filled in the emergency department.  Sitter one-to-one ordered.  Behavioral health consult ordered.  Marland Kitchen HTN (hypertension) soft blood pressures we will hold home medications for tonight . CKD (chronic kidney disease) stage 3, GFR 30-59 ml/min (HCC) chronic avoid nephrotoxic medications . Hydronephrosis due to obstructive malignant neoplasm of bladder [facility]) -urology aware will see in consult tomorrow morning . Urothelial carcinoma of bladder ([place]) -was in the process of getting hospice set up.  Will order palliative care consult discussed with family overall goals of care at this point going towards comfort care with avoiding over aggressive interventions okay for antibiotics  . Chronic systolic heart failure (HCC) avoid fluid overload given soft blood pressures we will unable to diurese effectively at this time . Hyperlipidemia LDL goal <70 chronic and stable  . Coronary artery disease of native heart with stable angina pectoris ([place]) chronic currently not endorsing any chest pain resume home medications when able to tolerate . GERD stable continue home medications  . RLS (restless  legs syndrome) stable continue home medications . Elevated troponin chronic currently trending down no evidence of acute event on EKG.  Likely demand related of note patient did get tased     . Hyponatremia chronic close to baseline obtain urine electrolytes and follow worsening hyponatremia may be contributing mental status changes appears to be fluid up currently avoid aggressive fluid resuscitation . Lower urinary tract infectious disease await results of urine culture treat with Rocephin for now could be colonization given hardware presence.   CAP small infiltrate noted on chest x-ray Rocephin and azithromycin await results of sputum cultures   . 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 Chronic anemia chronic currently at baseline patient have refused transfusions in the past.  Will need to discuss with patient and family overall goals of care and if he would like to proceed with any further treatments . Hypokalemia replace and follow  Colostomy site with bulging colostomy.  Patient have been known to remove his colostomy bag and pulled on the ostomy wife is concerned  that he may cause damage.  Discussed with general surgery who at this point recommends applying abdominal binder not tightly but for the purposes of concealing the site so that the patient will be less likely to pull on it.  Can be discussed  with patient's general surgeon Dr. Barry Dienes in AM to see if he would be a candidate for any revision Other plan as per orders.  DVT prophylaxis:  SCD     Code Status:   DNR/DNI comfort care avoid aggressive invention of with IV medications antibiotics as per  family  I had personally discussed CODE STATUS with patient and family Spent  25 minutes discussing goals of care with family. Overall poor prognosis given widespread bladder cancer and poor performance status with dementia and now worsening sundowning.  Appreciate palliative care consult for further discussion regards of  goals of care and facilitation of MOST  form may need to be admitted for rehabilitation and hospice at SNF    Family Communication:   Family   at  Bedside  plan of care was discussed with  Wife,  Disposition Plan:    likely will need placement for rehabilitation                                              Would benefit from PT/OT eval prior to DC  Ordered                                       Social Work  consulted                   Nutrition    consulted                  Wound care  consulted                   Palliative care    consulted                   Behavioral health  consulted                    Consults called:  Urology Dr. Diona Fanti will let Dr. Jeffie Coffey know in AM   discussed with Dr/ Marlou Starks with general surgery suggest at this time can apply abdominal binder to prevent the patient from accessing the sight and can discuss further with Dr. Barry Dienes in a.m.if needed  Admission status:  Obs   Level of care     tele  For 12H     Zameria Vogl [place] 02/08/2019, 7:48 PM    [facility]     after 2 AM please page floor coverage PA If 7AM-7PM, please contact the day team taking care of the patient using [email or web address]

## 2019-02-08 NOTE — ED Notes (Signed)
Admitting doc at bedside
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
## 2019-02-08 NOTE — ED Provider Notes (Signed)
[facility] DEPT Provider Note   CSN: [id number] Arrival date & time: 02/08/19  1415     History   Chief Complaint Chief Complaint  Patient presents with  . Psychiatric Evaluation  . IVC    HPI Charles Coffey is a 84 y.o. male hx of CAD s/p CABG, GERD, metastatic bladder cancer status post suprapubic catheter, nephrostomy tubes, ostomy, here presenting with altered mental status, homicidal ideations.  Patient has been having suicidal ideations and has been very depressed.  The last several days, patient has been telling his family to just let him die.  They actually talked to hospice today but he is not under hospice due to his nephrostomy tubes.  Patient recently started Bactrim for UTI.  He was started on Haldol yesterday and was given a small THC cookie.  He slept all night and woke up this morning and was agitated.  The visiting nurse came to see him and apparently he went to the kitchen and took out for knives and try to kill other people.  Police was then called and patient was tased by the police.   The history is provided by the patient.    Past Medical History:  Diagnosis Date  . Aortic atherosclerosis ([place])   . BPH with urinary obstruction   . CAD (coronary artery disease)    a.  MI 1995, CABG x 3 2002 (patient says that he had LIMA and RIMA grafts). b. ETT-Cardiolite (10/15) with EF 48%, apical scar, no ischemia. c. Nuc 07/2017 abnormal -> cath was performed,  patent LIMA to LAD and SVG to OM 2. RIMA to RCA is atretic. The right coronary artery is occluded distally with extensive collaterals from the LAD, medical therapy.   . Cancer [facility])    bladder cancer  . Cervical spondylosis without myelopathy December 07, 202017  . Chronic low back pain    sees Dr. Kary Kos   . GERD (gastroesophageal reflux disease)   . Hiatal hernia   . Hyperlipidemia   . Hypertension   . IBS (irritable bowel syndrome)   . Ischemic cardiomyopathy   . Myocardial  infarction ([place])   . RBBB   . Restless legs syndrome   . Statin intolerance   . Syncope    a. in 2015 - no apparent cause, was taking sleep medicine at the time. Cardiac workup unremarkable.  . Tubular adenoma of colon     Patient Active Problem List   Diagnosis Date Noted  . Acute exacerbation of CHF (congestive heart failure) ([place]) 11/25/2018  . Fatigue 11/25/2018  . Chronic pain 11/25/2018  . Depression 11/25/2018  . GERD (gastroesophageal reflux disease) 11/25/2018  . Status post colostomy ([place]) 11/25/2018  . Moderate episode of recurrent major depressive disorder ([place])   . Bacterial infection due to Proteus mirabilis 11/09/2018  . Sepsis ([place]) 11/09/2018  . AKI (acute kidney injury) ([place]) 11/09/2018  . UTI (urinary tract infection) 11/06/2018  . Cancer associated pain   . Palliative care by specialist   . DNR (do not resuscitate)   . Agitation   . Sepsis due to urinary tract infection ([place]) 08/19/2018  . Hyponatremia 08/19/2018  . Ventral hernia without obstruction or gangrene 07/21/2018  . Displacement of nephrostomy tube ([place]) 07/05/2018  . Nephrostomy tube displaced ([place]) 07/05/2018  . Malfunction of nephrostomy tube ([place])   . Lower urinary tract infectious disease 06/30/2018  . Port-A-Cath in place 03/31/2018  . PVC (premature ventricular contraction) 02/25/2018  . Malignant neoplasm of  urinary bladder ([place]) 02/25/2018  . Catheter-associated urinary tract infection ([place]) 02/17/2018  . Nephrostomy complication ([place]) 93/26/7124  . Chronic systolic heart failure ([place]) 02/07/2018  . Malnutrition of moderate degree 02/01/2018  . Acute respiratory failure with hypoxia ([place]) 01/31/2018  . Acute CHF (congestive heart failure) ([place]) 01/31/2018  . Thrombocytopathia ([place])   . DVT (deep venous thrombosis) ([place]) 01/11/2018  . Sepsis secondary to UTI ([place]) 01/01/2018  . Colostomy in place [facility]) 12/04/2017  . Rectal stenosis   . Low vitamin B12 level 11/30/2017  . Irritable  bowel syndrome with constipation   . Pyelonephritis 11/28/2017  . Fever 11/28/2017  . Ileus ([place]) 11/28/2017  . Elevated troponin 11/28/2017  . Urothelial carcinoma of bladder ([place]) 11/28/2017  . Dehydration 11/28/2017  . Thrombocytopenia ([place]) 11/28/2017  . Hydronephrosis due to obstructive malignant neoplasm of bladder ([place]) 11/25/2017  . Chronic anemia 09/20/2017  . CKD (chronic kidney disease) stage 3, GFR 30-59 ml/min (H[facility]) 09/20/2017  . Angina pectoris ([place])   . Abnormal stress test   . Cervical spondylosis without myelopathy 2020/03/2316  . PAD (peripheral artery disease) ([place]) 03/04/2015  . Coronary artery disease involving native heart without angina pectoris 11/01/2014  . Acid reflux 11/01/2014  . H/O male genital system disorder 11/01/2014  . RLS (restless legs syndrome) 11/01/2014  . Acne erythematosa 11/01/2014  . Syncope 09/24/2014  . Chronic low back pain 11/10/2013  . Ischemic cardiomyopathy 11/29/2012  . Hyperlipidemia LDL goal <70 10/15/2010  . RESTLESS LEGS SYNDROME 10/15/2010  . HTN (hypertension) 10/15/2010  . MYOCARDIAL INFARCTION, HX OF 10/15/2010  . Coronary artery disease of native heart with stable angina pectoris ([place]) 10/15/2010  . GERD 10/15/2010  . BENIGN PROSTATIC HYPERTROPHY 10/15/2010  . BENIGN PROSTATIC HYPERTROPHY, WITH OBSTRUCTION 10/15/2010  . COLONIC POLYPS, HX OF 10/15/2010    Past Surgical History:  Procedure Laterality Date  . COLONOSCOPY  10/17/2014   per Dr. Hilarie Fredrickson, tubular adenomas, repeat in 3 yrs   . COLONOSCOPY WITH PROPOFOL N/A 12/01/2017   Procedure: COLONOSCOPY WITH PROPOFOL;  Surgeon: Milus Banister, MD;  Location: [facility] END[facility]COPY;  Service: Endoscopy;  Laterality: N/A;  . CYSTOSCOPY W/ RETROGRADES Left 11/25/2017   Procedure: CYSTOSCOPY WITH RETROGRADE PYELOGRAM;  Surgeon: Irine Seal, MD;  Location: [facility] ORS;  Service: Urology;  Laterality: Left;  . HEART BYPASS    . IR CATHETER TUBE CHANGE  11/08/2018  . IR CATHETER TUBE  CHANGE  12/26/2018  . IR CATHETER TUBE CHANGE  01/08/2019  . IR CATHETER TUBE CHANGE  01/13/2019  . IR CONVERT LEFT NEPHROSTOMY TO NEPHROURETERAL CATH  09/20/2018  . IR CONVERT RIGHT NEPHROSTOMY TO NEPHROURETERAL CATH  02/04/2018  . IR CONVERT RIGHT NEPHROSTOMY TO NEPHROURETERAL CATH  08/23/2018  . IR EXT NEPHROURETERAL CATH EXCHANGE  04/11/2018  . IR EXT NEPHROURETERAL CATH EXCHANGE  07/04/2018  . IR EXT NEPHROURETERAL CATH EXCHANGE  09/20/2018  . IR EXT NEPHROURETERAL CATH EXCHANGE  11/08/2018  . IR EXT NEPHROURETERAL CATH EXCHANGE  11/08/2018  . IR EXT NEPHROURETERAL CATH EXCHANGE  12/26/2018  . IR EXT NEPHROURETERAL CATH EXCHANGE  12/26/2018  . IR EXT NEPHROURETERAL CATH EXCHANGE  01/17/2019  . IR FLUORO GUIDE CV LINE RIGHT  12/27/2017  . IR FLUORO GUIDED NEEDLE PLC ASPIRATION/INJECTION LOC  08/19/2018  . IR NEPHROSTOGRAM LEFT THRU EXISTING ACCESS  12/24/2017  . IR NEPHROSTOMY EXCHANGE LEFT  01/28/2018  . IR NEPHROSTOMY EXCHANGE LEFT  02/04/2018  . IR NEPHROSTOMY EXCHANGE LEFT  02/25/2018  . IR NEPHROSTOMY EXCHANGE LEFT  07/04/2018  . IR NEPHROSTOMY EXCHANGE LEFT  07/05/2018  . IR NEPHROSTOMY EXCHANGE LEFT  08/09/2018  . IR NEPHROSTOMY EXCHANGE LEFT  08/16/2018  . IR NEPHROSTOMY EXCHANGE LEFT  08/23/2018  . IR NEPHROSTOMY EXCHANGE RIGHT  12/24/2017  . IR NEPHROSTOMY EXCHANGE RIGHT  01/28/2018  . IR NEPHROSTOMY EXCHANGE RIGHT  04/11/2018  . IR NEPHROSTOMY EXCHANGE RIGHT  07/05/2018  . IR NEPHROSTOMY EXCHANGE RIGHT  08/09/2018  . IR NEPHROSTOMY EXCHANGE RIGHT  08/19/2018  . IR NEPHROSTOMY PLACEMENT LEFT  11/28/2017  . IR NEPHROSTOMY PLACEMENT RIGHT  12/03/2017  . IR PATIENT EVAL TECH 0-60 MINS  03/24/2018  . IR PATIENT EVAL TECH 0-60 MINS  03/28/2018  . IR PATIENT EVAL TECH 0-60 MINS  04/25/2018  . IR PATIENT EVAL TECH 0-60 MINS  06/20/2018  . IR US GUIDE VASC ACCESS RIGHT  12/27/2017  . LAPAROSCOPY N/A 12/01/2017   Procedure: LAPAROSCOPIC DIVERTING OSTOMY;  Surgeon: Stark Klein, MD;  Location: [facility];  Service:  General;  Laterality: N/A;  . LEFT HEART CATH AND CORS/GRAFTS ANGIOGRAPHY N/A 08/25/2017   Procedure: LEFT HEART CATH AND CORS/GRAFTS ANGIOGRAPHY;  Surgeon: [place] Hampshire, MD;  Location: [facility];  Service: Cardiovascular;  Laterality: N/A;  . LUMBAR FUSION  2003   L3-L4  . PROSTATE SURGERY  06-27-12   per Dr. Roni Bread, had CTT  . TONSILLECTOMY    . TRANSURETHRAL RESECTION OF BLADDER TUMOR N/A 11/25/2017   Procedure: TRANSURETHRAL RESECTION OF BLADDER TUMOR (TURBT);  Surgeon: Irine Seal, MD;  Location: [facility];  Service: Urology;  Laterality: N/A;        Home Medications    Prior to Admission medications   Medication Sig Start Date End Date Taking? Authorizing Provider  carvedilol (COREG) 3.125 MG tablet Take 3.125 mg by mouth 2 (two) times daily with a meal.   Yes [provider]  Cyanocobalamin (VITAMIN B 12 PO) Take 1 tablet by mouth daily.   Yes [provider]  famotidine (PEPCID) 20 MG tablet TAKE 1 TABLET BY MOUTH TWICE A DAY 02/02/19  Yes Wyatt Portela, MD  feeding supplement (BOOST HIGH PROTEIN) LIQD Take 1 Container by mouth 2 (two) times daily between meals.   Yes [provider]  furosemide (LASIX) 40 MG tablet Take 40 mg by mouth 3 (three) times daily.    Yes [provider]  haloperidol (HALDOL) 1 MG tablet 1 to 2 PO QHS 02/03/19  Yes Tanner, Lyndon Code., PA-C  hydrOXYzine (ATARAX/VISTARIL) 25 MG tablet Take 25 mg by mouth every 6 (six) hours as needed for itching.   Yes [provider]  lidocaine-prilocaine (EMLA) cream Apply 1 application topically as needed. 10/18/18  Yes Wyatt Portela, MD  mirabegron ER (MYRBETRIQ) 25 MG TB24 tablet Take 25 mg by mouth daily.   Yes [provider]  nitroGLYCERIN (NITROSTAT) 0.4 MG SL tablet PLACE 1 TABLET (0.4 MG TOTAL) UNDER THE TONGUE EVERY 5 (FIVE) MINUTES AS NEEDED. 09/27/18 02/08/19 Yes Dunn, Dayna N, PA-C  nystatin (NYSTATIN) powder Apply topically as needed (itching, raw  wound).   Yes [provider]  OLANZapine (ZYPREXA) 5 MG tablet Take 1 tablet (5 mg total) by mouth daily. Between 5-6 pm Patient taking differently: Take 2.5-5 mg by mouth daily. Between 5-6 pm 01/11/19  Yes [name], MD  oxyCODONE (OXYCONTIN) 10 mg 12 hr tablet Take 10 mg by mouth every 12 (twelve) hours as needed (pain).   Yes [provider]  oxyCODONE-acetaminophen (PERCOCET)  10-325 MG tablet Take 1 tablet by mouth every 4 (four) hours as needed for pain. Patient taking differently: Take 1-2 tablets by mouth every 4 (four) hours as needed for pain.  01/20/19  Yes Tanner, Lyndon Code., PA-C  PARoxetine (PAXIL) 10 MG tablet Take 1 tablet (10 mg total) by mouth daily. 11/29/18  Yes Cameron Sprang, MD  pramipexole (MIRAPEX) 1 MG tablet TAKE 1 AND 1/2 TABLETS BY MOUTH AT BEDTIME Patient taking differently: Take 2.5 mg by mouth every evening.  06/29/18  Yes [name], MD  Probiotic Product (PROBIOTIC PO) Take 1 tablet by mouth daily.   Yes [provider]  sulfamethoxazole-trimethoprim (BACTRIM DS,SEPTRA DS) 800-160 MG tablet Take 1 tablet by mouth 2 (two) times daily. 02/06/19  Yes [provider]  traMADol (ULTRAM) 50 MG tablet Take 1 tablet (50 mg total) by mouth every 6 (six) hours as needed. 01/20/19  Yes Tanner, Lyndon Code., PA-C  amoxicillin-clavulanate (AUGMENTIN) 875-125 MG tablet Take 1 tablet by mouth 2 (two) times daily. Patient not taking: Reported on 02/08/2019 01/30/19   Sandi Mealy E., PA-C  triamcinolone cream (KENALOG) 0.1 % Apply topically 2 (two) times daily as needed. To the ostomy site for itching Patient not taking: Reported on 02/08/2019 01/11/19   [name], MD    Family History Family History  Problem Relation Age of Onset  . Heart attack Mother   . Aneurysm Father        femoral artery  . Heart disease Brother   . Heart disease Maternal Uncle        x 2  . Colon cancer Neg Hx   . Esophageal cancer Neg Hx   . Pancreatic cancer Neg Hx    . Kidney disease Neg Hx   . Liver disease Neg Hx     Social History Social History   Tobacco Use  . Smoking status: Former Smoker    Types: Cigarettes    Last attempt to quit: 12/28/1978    Years since quitting: 40.1  . Smokeless tobacco: Never Used  Substance Use Topics  . Alcohol use: Not Currently    Alcohol/week: 2.0 standard drinks    Types: 1 Glasses of wine, 1 Cans of beer per week  . Drug use: No     Allergies   Statins and Cyproheptadine   Review of Systems Review of Systems  Psychiatric/Behavioral: Positive for agitation, behavioral problems and suicidal ideas.  All other systems reviewed and are negative.    Physical Exam Updated Vital Signs BP 98/68   Pulse 70   Temp (!) 97.4 F (36.3 C) (Oral)   Resp 20   Ht 5\' 8"  (1.727 m)   Wt 87.8 kg   SpO2 98%   BMI 29.43 kg/m   Physical Exam Vitals signs and nursing note reviewed.  Constitutional:      Comments: Altered, agitated   HENT:     Head: Normocephalic.     Mouth/Throat:     Mouth: Mucous membranes are dry.  Eyes:     Extraocular Movements: Extraocular movements intact.     Pupils: Pupils are equal, round, and reactive to light.  Neck:     Musculoskeletal: Normal range of motion.  Cardiovascular:     Rate and Rhythm: Normal rate.     Pulses: Normal pulses.     Heart sounds: Normal heart sounds.  Pulmonary:     Effort: Pulmonary effort is normal.     Comments: Crackles R base  Abdominal:     General: Abdomen is flat.     Palpations: Abdomen is soft.     Comments: Colostomy with brown stool. Suprapubic catheter in place   Musculoskeletal: Normal range of motion.  Skin:    General: Skin is warm.     Capillary Refill: Capillary refill takes less than 2 seconds.  Neurological:     General: No focal deficit present.  Psychiatric:     Comments: Agitated, suicidal and homicidal       ED Treatments / Results  Labs (all labs ordered are listed, but only abnormal results are  displayed) Labs Reviewed  CBC WITH DIFFERENTIAL/PLATELET - Abnormal; Notable for the following components:      Result Value   RBC 2.23 (*)    Hemoglobin 7.1 (*)    HCT 22.2 (*)    RDW 21.2 (*)    Platelets 50 (*)    Lymphs Abs 0.6 (*)    Abs Immature Granulocytes 0.22 (*)    All other components within normal limits  COMPREHENSIVE METABOLIC PANEL - Abnormal; Notable for the following components:   Sodium 129 (*)    Potassium 3.2 (*)    Chloride 97 (*)    Glucose, Bld 174 (*)    BUN 74 (*)    Creatinine, Ser 2.61 (*)    Albumin 3.2 (*)    AST 314 (*)    ALT 623 (*)    Total Bilirubin 1.3 (*)    GFR calc non Af Amer 22 (*)    GFR calc Af Amer 26 (*)    All other components within normal limits  URINALYSIS, ROUTINE W REFLEX MICROSCOPIC - Abnormal; Notable for the following components:   APPearance CLOUDY (*)    Hgb urine dipstick LARGE (*)    Protein, ur 30 (*)    Leukocytes,Ua LARGE (*)    RBC / HPF >50 (*)    WBC, UA >50 (*)    Bacteria, UA RARE (*)    All other components within normal limits  RAPID URINE DRUG SCREEN, HOSP PERFORMED - Abnormal; Notable for the following components:   Opiates POSITIVE (*)    Tetrahydrocannabinol POSITIVE (*)    All other components within normal limits  I-STAT TROPONIN, ED - Abnormal; Notable for the following components:   Troponin i, poc 0.10 (*)    All other components within normal limits  URINE CULTURE  CULTURE, BLOOD (ROUTINE X 2)  CULTURE, BLOOD (ROUTINE X 2)  ETHANOL  LACTIC ACID, PLASMA  LACTIC ACID, PLASMA    EKG EKG Interpretation  Date/Time:  Wednesday February 08 2019 16:11:05 EST Ventricular Rate:  77 PR Interval:    QRS Duration: 184 QT Interval:  412 QTC Calculation: 467 R Axis:   -80 Text Interpretation:  Sinus rhythm Multiform ventricular premature complexes Prolonged PR interval Right bundle branch block Abnormal T, consider ischemia, lateral leads No significant change since last tracing Confirmed by  Wandra Arthurs [phone]) on 02/08/2019 4:14:23 PM   Radiology Dg Chest 2 View  Result Date: 02/08/2019 CLINICAL DATA:  Altered mentation and confusion. EXAM: CHEST - 2 VIEW COMPARISON:  11/25/2018 FINDINGS: Stable cardiomegaly with aortic atherosclerosis and post CABG change. Low lung volumes with interstitial edema is noted. Slightly more confluent right perihilar airspace disease is identified. Pneumonia is not excluded versus atelectasis. No significant effusion or pneumothorax. Port catheter tip terminates in the distal SVC. IMPRESSION: 1. Cardiomegaly with aortic atherosclerosis and post CABG change. 2. Low lung volumes with  interstitial edema. 3. Slightly more confluent airspace opacity in the right perihilar region. Pneumonia is not excluded. Electronically Signed   By: Ashley Royalty M.D.   On: 02/08/2019 16:44   Ct Head Wo Contrast  Result Date: 02/08/2019 CLINICAL DATA:  Altered level of consciousness, sundowning, history coronary artery disease post CABG and MI, ischemic cardiomyopathy, hypertension, bladder cancer EXAM: CT HEAD WITHOUT CONTRAST TECHNIQUE: Contiguous axial images were obtained from the base of the skull through the vertex without intravenous contrast. Sagittal and coronal MPR images reconstructed from axial data set. COMPARISON:  None FINDINGS: Brain: Generalized atrophy. Normal ventricular morphology. No midline shift or mass effect. Small vessel chronic ischemic changes of deep cerebral white matter. No intracranial hemorrhage, mass lesion, evidence of acute infarction, or extra-axial fluid collection. Vascular: Atherosclerotic calcifications of internal carotid arteries at skull base Skull: Intact Sinuses/Orbits: Clear Other: N/A IMPRESSION: Atrophy with small vessel chronic ischemic changes of deep cerebral white matter. No acute intracranial abnormalities. Electronically Signed   By: Lavonia Dana M.D.   On: 02/08/2019 17:06   Ct Renal Stone Study  Result Date:  02/08/2019 CLINICAL DATA:  Hematuria, suspected renal parenchymal cause, UTI treated with Bactrim this week, history coronary artery disease post MI and CABG, bladder cancer, hypertension, ischemic cardiomyopathy, GERD, CHF, former smoker EXAM: CT ABDOMEN AND PELVIS WITHOUT CONTRAST TECHNIQUE: Multidetector CT imaging of the abdomen and pelvis was performed following the standard protocol without IV contrast. Sagittal and coronal MPR images reconstructed from axial data set. Oral contrast was not administered. COMPARISON:  11/06/2018 FINDINGS: Lower chest: BILATERAL pleural effusions, small LEFT and moderate RIGHT. Peribronchial thickening. Minimal atelectasis RIGHT lung base. Hepatobiliary: Gallbladder and liver normal appearance Pancreas: Normal appearance Spleen: Normal appearance Adrenals/Urinary Tract: Adrenal glands normal appearance. Atrophic LEFT kidney. BILATERAL nephrostomy tubes. No definite renal mass. Mild LEFT renal collecting system dilatation. BILATERAL ureteral stents. Mildly thickened bladder wall question due to tumor, cystitis or chronic outlet obstruction suprapubic catheter present. Stomach/Bowel: Transverse double-barrel colostomy. Stool in RIGHT colon. Decompressed distal colon. Stomach and small bowel loops unremarkable. Vascular/Lymphatic: Atherosclerotic calcifications aorta, iliac arteries, coronary arteries. Aorta normal caliber. No definite abdominal or pelvic adenopathy. Reproductive: Minimal prostatic enlargement. Other: LEFT inguinal hernia containing fat. Scattered subcutaneous and intra-abdominal tissue edema. No free air or free fluid. Minimal presacral stranding. Musculoskeletal: Bones demineralized with prior L3-L4 posterior fusion scattered degenerative disc disease changes. IMPRESSION: BILATERAL nephrostomy tubes and ureteral stents with mild persistent dilatation of the LEFT renal collecting system. Bladder wall thickening which could reflect tumor, L obstruction or cystitis.  Small LEFT inguinal hernia containing fat. BILATERAL pleural effusions RIGHT greater than LEFT. Electronically Signed   By: Lavonia Dana M.D.   On: 02/08/2019 17:13    Procedures Procedures (including critical care time)   CRITICAL CARE Performed by: Wandra Arthurs   Total critical care time: 30 minutes  Critical care time was exclusive of separately billable procedures and treating other patients.  Critical care was necessary to treat or prevent imminent or life-threatening deterioration.  Critical care was time spent personally by me on the following activities: development of treatment plan with patient and/or surrogate as well as nursing, discussions with consultants, evaluation of patient's response to treatment, examination of patient, obtaining history from patient or surrogate, ordering and performing treatments and interventions, ordering and review of laboratory studies, ordering and review of radiographic studies, pulse oximetry and re-evaluation of patient's condition.   Medications Ordered in ED Medications  cefTRIAXone (ROCEPHIN) 1 g in sodium  chloride 0.9 % 100 mL IVPB (1 g Intravenous New Bag/Given 02/08/19 1825)  azithromycin (ZITHROMAX) 500 mg in sodium chloride 0.9 % 250 mL IVPB (has no administration in time range)  fluconazole (DIFLUCAN) tablet 150 mg (has no administration in time range)  0.9 %  sodium chloride infusion (1,000 mLs Intravenous New Bag/Given 02/08/19 1824)  LORazepam (ATIVAN) injection 1 mg (1 mg Intravenous Given 02/08/19 1611)  sodium chloride 0.9 % bolus 1,000 mL (0 mLs Intravenous Stopped 02/08/19 1827)     Initial Impression / Assessment and Plan / ED Course  I have reviewed the triage vital signs and the nursing notes.  Pertinent labs & imaging results that were available during my care of the patient were reviewed by me and considered in my medical decision making (see chart for details).    Charles Coffey is a 84 y.o. male  Here with  suicidal ideation, homicidal ideation. Patient has metastatic bladder cancer with suprapubic catheter, colostomy. Patient has been depressed and now has suicidal and homicidal ideation. Medically complicated and will need labs, CT head, CT renal stone, UA. Will consult psych and likely need geri psych. Patient is not in hospice currently. Will consult case management as well.   6:36 PM Patient has elevated trop likely from the tasing. No obvious STEMI on EKG. Mild AKI and has catheter associated UTI. Nephrostomy tubes in place. Will admit for UTI, elevated trop, AKI.       Final Clinical Impressions(s) / ED Diagnoses   Final diagnoses:  None    ED Discharge Orders    None       Drenda Freeze, MD 02/08/19 [phone]

## 2019-02-08 NOTE — ED Notes (Signed)
Pt beginning to arouse. Pt is irritated at the time.

## 2019-02-08 NOTE — ED Notes (Signed)
Bed: WA06 Expected date:  Expected time:  Means of arrival:  Comments: TCU

## 2019-02-08 NOTE — ED Notes (Signed)
Bed: NV98 Expected date:  Expected time:  Means of arrival:  Comments: EMS/IVC

## 2019-02-08 NOTE — Telephone Encounter (Signed)
Returned phone call to wife Charles Coffey per voicemail message.  Wife states that pt has continued to have insomnia with paranoia and confusion.  He is not able to recognized "what his ostomy bag is or why he has it" nor is he able to empty or care for it as he normally does every day.  He has removed his ostomy and suprapubic drainage bags.  He began taking "Bactrum"  For treatment of a UTI.  She does remember giving him 1 Haldol tablet last PM which was recently prescribed by Oncology.  No reported improvement of agitation with use of Haldol. She expressed her concern that she thought his confusion was related to infection of his urine and possibly of his stoma.  We have discussed in length the other possible causes of his confusion which could also be related to terminal agitation.  Transition to Hospice care has been discussed with patient and wife several times in the past and today.  She had planned to have his nephrostomy tubes removed and replaced with stents per Urology.  I am uncertain as to whether pt could tolerate this procedure with his additional decline.  Wife reports that pt is currently sleeping.  Plan on further planning and discussion with Eliseo Squires PA with Oncology and phone wife with results.    Wife agreed with plan.    Gonzella Lex, NP-C

## 2019-02-08 NOTE — ED Notes (Signed)
ED Provider at bedside. YAO 

## 2019-02-08 NOTE — Telephone Encounter (Signed)
Pt. of Dr. [name].
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
## 2019-02-09 ENCOUNTER — Encounter (HOSPITAL_COMMUNITY): Payer: Self-pay | Admitting: Urology

## 2019-02-09 DIAGNOSIS — F05 Delirium due to known physiological condition: Secondary | ICD-10-CM

## 2019-02-09 DIAGNOSIS — N136 Pyonephrosis: Secondary | ICD-10-CM | POA: Diagnosis not present

## 2019-02-09 DIAGNOSIS — I340 Nonrheumatic mitral (valve) insufficiency: Secondary | ICD-10-CM | POA: Diagnosis not present

## 2019-02-09 DIAGNOSIS — E860 Dehydration: Secondary | ICD-10-CM | POA: Diagnosis not present

## 2019-02-09 DIAGNOSIS — N135 Crossing vessel and stricture of ureter without hydronephrosis: Secondary | ICD-10-CM

## 2019-02-09 DIAGNOSIS — Z7189 Other specified counseling: Secondary | ICD-10-CM | POA: Diagnosis not present

## 2019-02-09 DIAGNOSIS — N183 Chronic kidney disease, stage 3 (moderate): Secondary | ICD-10-CM | POA: Diagnosis present

## 2019-02-09 DIAGNOSIS — Z515 Encounter for palliative care: Secondary | ICD-10-CM | POA: Diagnosis not present

## 2019-02-09 DIAGNOSIS — E871 Hypo-osmolality and hyponatremia: Secondary | ICD-10-CM | POA: Diagnosis present

## 2019-02-09 DIAGNOSIS — R45851 Suicidal ideations: Secondary | ICD-10-CM | POA: Diagnosis present

## 2019-02-09 DIAGNOSIS — N179 Acute kidney failure, unspecified: Secondary | ICD-10-CM | POA: Diagnosis not present

## 2019-02-09 DIAGNOSIS — D6959 Other secondary thrombocytopenia: Secondary | ICD-10-CM | POA: Diagnosis present

## 2019-02-09 DIAGNOSIS — I5022 Chronic systolic (congestive) heart failure: Secondary | ICD-10-CM | POA: Diagnosis not present

## 2019-02-09 DIAGNOSIS — K219 Gastro-esophageal reflux disease without esophagitis: Secondary | ICD-10-CM | POA: Diagnosis present

## 2019-02-09 DIAGNOSIS — R451 Restlessness and agitation: Secondary | ICD-10-CM | POA: Diagnosis not present

## 2019-02-09 DIAGNOSIS — J984 Other disorders of lung: Secondary | ICD-10-CM | POA: Diagnosis not present

## 2019-02-09 DIAGNOSIS — C7982 Secondary malignant neoplasm of genital organs: Secondary | ICD-10-CM | POA: Diagnosis not present

## 2019-02-09 DIAGNOSIS — T83518A Infection and inflammatory reaction due to other urinary catheter, initial encounter: Secondary | ICD-10-CM | POA: Diagnosis present

## 2019-02-09 DIAGNOSIS — J918 Pleural effusion in other conditions classified elsewhere: Secondary | ICD-10-CM | POA: Diagnosis not present

## 2019-02-09 DIAGNOSIS — R7989 Other specified abnormal findings of blood chemistry: Secondary | ICD-10-CM | POA: Diagnosis present

## 2019-02-09 DIAGNOSIS — R45850 Homicidal ideations: Secondary | ICD-10-CM | POA: Diagnosis not present

## 2019-02-09 DIAGNOSIS — G9340 Encephalopathy, unspecified: Secondary | ICD-10-CM | POA: Diagnosis present

## 2019-02-09 DIAGNOSIS — Z436 Encounter for attention to other artificial openings of urinary tract: Secondary | ICD-10-CM | POA: Diagnosis not present

## 2019-02-09 DIAGNOSIS — I25118 Atherosclerotic heart disease of native coronary artery with other forms of angina pectoris: Secondary | ICD-10-CM | POA: Diagnosis present

## 2019-02-09 DIAGNOSIS — D630 Anemia in neoplastic disease: Secondary | ICD-10-CM | POA: Diagnosis present

## 2019-02-09 DIAGNOSIS — J90 Pleural effusion, not elsewhere classified: Secondary | ICD-10-CM | POA: Diagnosis not present

## 2019-02-09 DIAGNOSIS — I5023 Acute on chronic systolic (congestive) heart failure: Secondary | ICD-10-CM | POA: Diagnosis not present

## 2019-02-09 DIAGNOSIS — R410 Disorientation, unspecified: Secondary | ICD-10-CM | POA: Diagnosis not present

## 2019-02-09 DIAGNOSIS — N390 Urinary tract infection, site not specified: Secondary | ICD-10-CM | POA: Diagnosis not present

## 2019-02-09 DIAGNOSIS — I248 Other forms of acute ischemic heart disease: Secondary | ICD-10-CM | POA: Diagnosis not present

## 2019-02-09 DIAGNOSIS — R17 Unspecified jaundice: Secondary | ICD-10-CM | POA: Diagnosis not present

## 2019-02-09 DIAGNOSIS — J189 Pneumonia, unspecified organism: Secondary | ICD-10-CM | POA: Diagnosis not present

## 2019-02-09 DIAGNOSIS — N320 Bladder-neck obstruction: Secondary | ICD-10-CM | POA: Diagnosis not present

## 2019-02-09 DIAGNOSIS — R69 Illness, unspecified: Secondary | ICD-10-CM | POA: Diagnosis not present

## 2019-02-09 DIAGNOSIS — F600 Paranoid personality disorder: Secondary | ICD-10-CM | POA: Diagnosis not present

## 2019-02-09 DIAGNOSIS — D631 Anemia in chronic kidney disease: Secondary | ICD-10-CM | POA: Diagnosis present

## 2019-02-09 DIAGNOSIS — Y846 Urinary catheterization as the cause of abnormal reaction of the patient, or of later complication, without mention of misadventure at the time of the procedure: Secondary | ICD-10-CM | POA: Diagnosis present

## 2019-02-09 DIAGNOSIS — D649 Anemia, unspecified: Secondary | ICD-10-CM | POA: Diagnosis not present

## 2019-02-09 DIAGNOSIS — F0390 Unspecified dementia without behavioral disturbance: Secondary | ICD-10-CM | POA: Diagnosis present

## 2019-02-09 DIAGNOSIS — C678 Malignant neoplasm of overlapping sites of bladder: Secondary | ICD-10-CM | POA: Diagnosis not present

## 2019-02-09 DIAGNOSIS — E785 Hyperlipidemia, unspecified: Secondary | ICD-10-CM | POA: Diagnosis present

## 2019-02-09 DIAGNOSIS — T83511A Infection and inflammatory reaction due to indwelling urethral catheter, initial encounter: Secondary | ICD-10-CM | POA: Diagnosis not present

## 2019-02-09 DIAGNOSIS — C799 Secondary malignant neoplasm of unspecified site: Secondary | ICD-10-CM | POA: Diagnosis present

## 2019-02-09 DIAGNOSIS — I130 Hypertensive heart and chronic kidney disease with heart failure and stage 1 through stage 4 chronic kidney disease, or unspecified chronic kidney disease: Secondary | ICD-10-CM | POA: Diagnosis not present

## 2019-02-09 DIAGNOSIS — D696 Thrombocytopenia, unspecified: Secondary | ICD-10-CM | POA: Diagnosis not present

## 2019-02-09 DIAGNOSIS — C679 Malignant neoplasm of bladder, unspecified: Secondary | ICD-10-CM | POA: Diagnosis not present

## 2019-02-09 DIAGNOSIS — C384 Malignant neoplasm of pleura: Secondary | ICD-10-CM | POA: Diagnosis not present

## 2019-02-09 DIAGNOSIS — E876 Hypokalemia: Secondary | ICD-10-CM | POA: Diagnosis present

## 2019-02-09 LAB — TSH: TSH: 1.853 u[IU]/mL (ref 0.350–4.500)

## 2019-02-09 LAB — CBC
HCT: 21.3 % — ABNORMAL LOW (ref 39.0–52.0)
Hemoglobin: 7 g/dL — ABNORMAL LOW (ref 13.0–17.0)
MCH: 33.2 pg (ref 26.0–34.0)
MCHC: 32.9 g/dL (ref 30.0–36.0)
MCV: 100.9 fL — ABNORMAL HIGH (ref 80.0–100.0)
Platelets: 43 10*3/uL — ABNORMAL LOW (ref 150–400)
RBC: 2.11 MIL/uL — ABNORMAL LOW (ref 4.22–5.81)
RDW: 21.5 % — AB (ref 11.5–15.5)
WBC: 5.3 10*3/uL (ref 4.0–10.5)
nRBC: 0 % (ref 0.0–0.2)

## 2019-02-09 LAB — PHOSPHORUS: Phosphorus: 4.6 mg/dL (ref 2.5–4.6)

## 2019-02-09 LAB — COMPREHENSIVE METABOLIC PANEL
ALT: 548 U/L — ABNORMAL HIGH (ref 0–44)
AST: 251 U/L — ABNORMAL HIGH (ref 15–41)
Albumin: 3.1 g/dL — ABNORMAL LOW (ref 3.5–5.0)
Alkaline Phosphatase: 67 U/L (ref 38–126)
Anion gap: 12 (ref 5–15)
BUN: 71 mg/dL — AB (ref 8–23)
CO2: 21 mmol/L — ABNORMAL LOW (ref 22–32)
Calcium: 8.8 mg/dL — ABNORMAL LOW (ref 8.9–10.3)
Chloride: 100 mmol/L (ref 98–111)
Creatinine, Ser: 2.49 mg/dL — ABNORMAL HIGH (ref 0.61–1.24)
GFR calc Af Amer: 27 mL/min — ABNORMAL LOW (ref >60)
GFR, EST NON AFRICAN AMERICAN: 23 mL/min — AB (ref >60)
Glucose, Bld: 92 mg/dL (ref 70–99)
Potassium: 3.3 mmol/L — ABNORMAL LOW (ref 3.5–5.1)
Sodium: 133 mmol/L — ABNORMAL LOW (ref 135–145)
Total Bilirubin: 1 mg/dL (ref 0.3–1.2)
Total Protein: 6 g/dL — ABNORMAL LOW (ref 6.5–8.1)

## 2019-02-09 LAB — OSMOLALITY, URINE: Osmolality, Ur: 497 mOsm/kg (ref 300–900)

## 2019-02-09 LAB — CREATININE, URINE, RANDOM: Creatinine, Urine: 68.5 mg/dL

## 2019-02-09 LAB — MAGNESIUM: Magnesium: 2.9 mg/dL — ABNORMAL HIGH (ref 1.7–2.4)

## 2019-02-09 LAB — TROPONIN I: Troponin I: 0.08 ng/mL (ref <0.03)

## 2019-02-09 LAB — SODIUM, URINE, RANDOM: SODIUM UR: 14 mmol/L

## 2019-02-09 LAB — OSMOLALITY: Osmolality: 297 mOsm/kg — ABNORMAL HIGH (ref 275–295)

## 2019-02-09 LAB — MRSA PCR SCREENING: MRSA BY PCR: POSITIVE — AB

## 2019-02-09 MED ORDER — PRAMIPEXOLE DIHYDROCHLORIDE 1 MG PO TABS
2.5000 mg | ORAL_TABLET | Freq: Two times a day (BID) | ORAL | Status: DC
  Administered 2019-02-09 – 2019-02-21 (×25): 2.5 mg via ORAL
  Filled 2019-02-09 (×26): qty 2

## 2019-02-09 MED ORDER — OLANZAPINE 5 MG PO TABS
2.5000 mg | ORAL_TABLET | Freq: Every day | ORAL | Status: DC | PRN
  Filled 2019-02-09: qty 1

## 2019-02-09 MED ORDER — SODIUM CHLORIDE 0.9% FLUSH
10.0000 mL | INTRAVENOUS | Status: DC | PRN
  Administered 2019-02-09: 20 mL
  Administered 2019-02-11: 10 mL
  Administered 2019-02-15: 30 mL
  Administered 2019-02-16 – 2019-02-17 (×2): 10 mL
  Filled 2019-02-09 (×5): qty 40

## 2019-02-09 MED ORDER — MUPIROCIN 2 % EX OINT
1.0000 "application " | TOPICAL_OINTMENT | Freq: Two times a day (BID) | CUTANEOUS | Status: AC
  Administered 2019-02-09 – 2019-02-14 (×10): 1 via NASAL
  Filled 2019-02-09: qty 22

## 2019-02-09 MED ORDER — ENSURE ENLIVE PO LIQD
237.0000 mL | Freq: Two times a day (BID) | ORAL | Status: DC
  Administered 2019-02-16: 237 mL via ORAL

## 2019-02-09 MED ORDER — OLANZAPINE 5 MG PO TABS
7.5000 mg | ORAL_TABLET | Freq: Every day | ORAL | Status: DC
  Administered 2019-02-09 – 2019-02-12 (×4): 7.5 mg via ORAL
  Filled 2019-02-09 (×5): qty 2

## 2019-02-09 MED ORDER — CHLORHEXIDINE GLUCONATE CLOTH 2 % EX PADS
6.0000 | MEDICATED_PAD | Freq: Every day | CUTANEOUS | Status: AC
  Administered 2019-02-10 – 2019-02-14 (×5): 6 via TOPICAL

## 2019-02-09 MED ORDER — HALOPERIDOL LACTATE 5 MG/ML IJ SOLN
2.0000 mg | Freq: Four times a day (QID) | INTRAMUSCULAR | Status: DC | PRN
  Administered 2019-02-09: 2 mg via INTRAVENOUS
  Filled 2019-02-09 (×2): qty 1

## 2019-02-09 MED ORDER — ADULT MULTIVITAMIN W/MINERALS CH
1.0000 | ORAL_TABLET | Freq: Every day | ORAL | Status: DC
  Administered 2019-02-10 – 2019-02-21 (×12): 1 via ORAL
  Filled 2019-02-09 (×12): qty 1

## 2019-02-09 MED ORDER — HALOPERIDOL LACTATE 5 MG/ML IJ SOLN
2.0000 mg | Freq: Four times a day (QID) | INTRAMUSCULAR | Status: DC | PRN
  Administered 2019-02-09: 2 mg via INTRAVENOUS
  Filled 2019-02-09: qty 1

## 2019-02-09 NOTE — Consult Note (Addendum)
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 stabilization/psychosis. Start Zyprexa 2.5 mg daily PRN for worsening agitation. Can provide IV Haldol if unable to take Zyprexa due to agitation.  -Consider Saphris 5 mg BID if Zyprexa is ineffective.  -Continue Paxil 10 mg daily for mood.  -Eliminate use of benzodiazepines and other deliriogenic medications that can worsen mental status.  -EKG reviewed and QTc 467 on 2/12. Please closely monitor when starting or increasing QTc prolonging agents.  -Psychiatry will  sign off on patient at this time. Please consult psychiatry again as needed.    Disposition: Patient does not meet criteria for psychiatric inpatient admission.  Faythe Dingwall, DO 02/09/2019 12:31 PM

## 2019-02-09 NOTE — Progress Notes (Signed)
Initial Nutrition Assessment  DOCUMENTATION CODES:   Not applicable  INTERVENTION:    Ensure Enlive po BID, each supplement provides 350 kcal and 20 grams of protein  MVI daily  NUTRITION DIAGNOSIS:   Inadequate oral intake related to lethargy/confusion as evidenced by meal completion < 25%.  GOAL:   Patient will meet greater than or equal to 90% of their needs  MONITOR:   PO intake, Supplement acceptance, Weight trends, Labs, I & O's  REASON FOR ASSESSMENT:   Consult Assessment of nutrition requirement/status  ASSESSMENT:   Patient with PMH significant for CAD s/p CABG, GERD,CHF, metastatic bladder cancer s/p suprapubic catheter, nephrostomy tubes, ostomy, and dementia with sundowning. Presents this admission with agitation and homicidal ideations.    Pt asleep upon assessment. Wife at bedside reports pt had a great appetite until three weeks ago. States he felt like he was gaining weight (likely fluid related) and he stopped eating. His appetite increased back to baseline over the last couple of days. Wife reports she attempts to follow a low sodium diet but a lot of the time pt wants to eat sweets (donuts). Meals are inconsistent when appetite is at baseline. Pt does not use supplementation at home but wife would like Ensure to be sent this admission. RD observed meal tray untouched at bedside.   Wife endorses a UBW of 170 lb and a recent wt gain of 10 lb. Records indicate pt weighed 178 lb at the beginning of the year and 196 lb this admission. Wife states she does not think he has lost dry weight due to frequently consumption of "junk food."   UOP: 650 ml x 24 hrs   Medications reviewed.  Labs reviewed: Na 133 (L) K 3.3 (L) Mg 2.9 (L)   NUTRITION - FOCUSED PHYSICAL EXAM:  Unable to perform as MD walked in.   Diet Order:   Diet Order            Diet Heart Room service appropriate? Yes; Fluid consistency: Thin  Diet effective now              EDUCATION  NEEDS:   Not appropriate for education at this time  Skin:  Skin Assessment: Reviewed RN Assessment  Last BM:  2/13- 50 ml colostomy  Height:   Ht Readings from Last 1 Encounters:  02/08/19 5\' 8"  (1.727 m)    Weight:   Wt Readings from Last 1 Encounters:  02/08/19 87.8 kg    Ideal Body Weight:  70 kg  BMI:  Body mass index is 29.43 kg/m.  Estimated Nutritional Needs:   Kcal:  1800-2000 kcal  Protein:  90-105 grams  Fluid:  >/= 1.8 L/day   Mariana Single RD, LDN Clinical Nutrition Pager # - [phone]

## 2019-02-09 NOTE — Care Management Obs Status (Signed)
[facility] NOTIFICATION   Patient Details  Name: Charles Coffey MRN: [id number] Date of Birth: 27-Jun-1935   Medicare Observation Status Notification Given:  Other (see comment)Pt is confused unable to sign.     [name], RN 02/09/2019, 3:03 PM

## 2019-02-09 NOTE — Progress Notes (Signed)
Aware of IR request for possible image-guided bilateral nephroureteral catheter conversion to antegrade stents and suprapubic catheter exchange.  Due to scheduling, the earliest we could accomidate patient's procedure would be Monday 02/13/2019 at the earliest. If patient is pending discharge, procedure can be done on an outpatient basis but would require outpatient order. Dr. Wyline Copas aware- states that he will inform Dr. [name] to place outpatient order if patient to be discharged before Monday.  Please call IR with questions/concerns.  [name] Louk, PA-C 02/09/2019, 5:34 PM

## 2019-02-09 NOTE — Consult Note (Signed)
Consultation Note Date: 02/09/2019   Patient Name: Charles Coffey  DOB: 1935/09/20  MRN: [id number]  Age / Sex: 84 y.o., male  PCP: [name], MD Referring Physician: Donne Hazel, MD  Reason for Consultation: Establishing goals of care  HPI/Patient Profile: 84 y.o. male  with past medical history of CAD, CABG, GERD, met bladder cancer, suprapubic catheter, nephrostomy tubes, ostomy, and sundowning admitted on 02/08/2019 with confusion, aggressive behavior, UTI.   Clinical Assessment and Goals of Care: Met briefly with patient in room.  He was mildly agitated and without insight into his medical condition.  I met today with patient wife outside of the room.  She is familia with palliative care as he is followed by Gonzella Lex with [facility] as an OP.   She is distraught as he had significant mental change the night of admission where he threatened family with knives and was tased by police.  We discussed clinical course as well as wishes moving forward in regard to long term care plan.  Concepts specific to code status and rehospitalization discussed.  We discussed difference between a aggressive medical intervention path and a palliative, comfort focused care path.  His wife reports that plan has been to internalize nephrostomy tubes and then transition to hospice.  He has also been pulling at his ostomy site recently.  Concept of Hospice and Palliative Care were discussed  Questions and concerns addressed.   PMT will continue to support holistically.  SUMMARY OF RECOMMENDATIONS   - DNR/DNI - Long conversation with wife regarding his continued decline, desire for comfort, and significant concern with his recent agitation.  His wife has good understanding of his condition and is interested in hospice services, but obvious concerns with his aggressive behavior prior to admission.  Her son  is coming for out of town and she would like to meet tomorrow at [facility] to discuss further.   - She has been working with [facility] and is interested in transition to hospice.  Will reach out to [facility] to see if available to discuss with family or possibly have joint meeting to discuss plan.  Code Status/Advance Care Planning:  DNR   Symptom Management:   Agitation.  Appreciate psych recs.  Palliative Prophylaxis:   Delirium Protocol and Frequent Pain Assessment  Additional Recommendations (Limitations, Scope, Preferences):  Avoid Hospitalization  Psycho-social/Spiritual:   Desire for further Chaplaincy support:Did not address today  Additional Recommendations: Education on Hospice  Prognosis:   < 6 months  Discharge Planning: To Be Determined      Primary Diagnoses: Present on Admission: . HTN (hypertension) . CKD (chronic kidney disease) stage 3, GFR 30-59 ml/min (HCC) . Hydronephrosis due to obstructive malignant neoplasm of bladder ([place]) . Urothelial carcinoma of bladder ([place]) . Chronic systolic heart failure ([place]) . Hyperlipidemia LDL goal <70 . Coronary artery disease of native heart with stable angina pectoris ([place]) . GERD . RLS (restless legs syndrome) . Elevated troponin . Dehydration . Agitation . Delirium . AKI (acute  kidney injury) ([place]) . Hyponatremia . Lower urinary tract infectious disease . 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 Chronic anemia . Hypokalemia . CAP (community acquired pneumonia)   I have reviewed the medical record, interviewed the patient and family, and examined the patient. The following aspects are pertinent.  Past Medical History:  Diagnosis Date  . Aortic atherosclerosis ([place])   . BPH with urinary obstruction   . CAD (coronary artery disease)    a.  MI 1995, CABG x 3 2002 (patient says that he had LIMA and RIMA grafts). b. ETT-Cardiolite (10/15) with EF 48%, apical scar, no ischemia. c. Nuc 07/2017 abnormal -> cath was  performed,  patent LIMA to LAD and SVG to OM 2. RIMA to RCA is atretic. The right coronary artery is occluded distally with extensive collaterals from the LAD, medical therapy.   . Cancer [facility])    bladder cancer  . Cervical spondylosis without myelopathy 02-02-202017  . Chronic low back pain    sees Dr. Kary Kos   . GERD (gastroesophageal reflux disease)   . Hiatal hernia   . Hyperlipidemia   . Hypertension   . IBS (irritable bowel syndrome)   . Ischemic cardiomyopathy   . Myocardial infarction ([place])   . RBBB   . Restless legs syndrome   . Statin intolerance   . Syncope    a. in 2015 - no apparent cause, was taking sleep medicine at the time. Cardiac workup unremarkable.  . Tubular adenoma of colon    Social History   Socioeconomic History  . Marital status: Married    Spouse name: Not on file  . Number of children: 5  . Years of education: Some coll  . Highest education level: Not on file  Occupational History  . Occupation: retired  Scientific laboratory technician  . Financial resource strain: Not on file  . Food insecurity:    Worry: Not on file    Inability: Not on file  . Transportation needs:    Medical: Not on file    Non-medical: Not on file  Tobacco Use  . Smoking status: Former Smoker    Types: Cigarettes    Last attempt to quit: 12/28/1978    Years since quitting: 40.1  . Smokeless tobacco: Never Used  Substance and Sexual Activity  . Alcohol use: Not Currently    Alcohol/week: 2.0 standard drinks    Types: 1 Glasses of wine, 1 Cans of beer per week  . Drug use: No  . Sexual activity: Not Currently  Lifestyle  . Physical activity:    Days per week: Not on file    Minutes per session: Not on file  . Stress: Not on file  Relationships  . Social connections:    Talks on phone: Not on file    Gets together: Not on file    Attends religious service: Not on file    Active member of club or organization: Not on file    Attends meetings of clubs or organizations: Not on file      Relationship status: Not on file  Other Topics Concern  . Not on file  Social History Narrative   Lives at home w/ his wife   Right-handed   5 cups of coffee per day   Family History  Problem Relation Age of Onset  . Heart attack Mother   . Aneurysm Father        femoral artery  . Heart disease Brother   . Heart disease Maternal Uncle  x 2  . Colon cancer Neg Hx   . Esophageal cancer Neg Hx   . Pancreatic cancer Neg Hx   . Kidney disease Neg Hx   . Liver disease Neg Hx    Scheduled Meds: . feeding supplement (ENSURE ENLIVE)  237 mL Oral BID BM  . fluconazole  150 mg Oral Once  . mirabegron ER  25 mg Oral Daily  . multivitamin with minerals  1 tablet Oral Daily  . OLANZapine  7.5 mg Oral QAC supper  . PARoxetine  10 mg Oral Daily  . pramipexole  2.5 mg Oral BID   Continuous Infusions: . sodium chloride 10 mL/hr at 02/09/19 0500  . cefTRIAXone (ROCEPHIN)  IV 1 g (02/09/19 1753)   PRN Meds:.sodium chloride, acetaminophen **OR** acetaminophen, haloperidol lactate, OLANZapine, ondansetron **OR** ondansetron (ZOFRAN) IV, oxyCODONE-acetaminophen **AND** oxyCODONE, sodium chloride flush Medications Prior to Admission:  Prior to Admission medications   Medication Sig Start Date End Date Taking? Authorizing Provider  carvedilol (COREG) 3.125 MG tablet Take 3.125 mg by mouth 2 (two) times daily with a meal.   Yes [provider]  Cyanocobalamin (VITAMIN B 12 PO) Take 1 tablet by mouth daily.   Yes [provider]  famotidine (PEPCID) 20 MG tablet TAKE 1 TABLET BY MOUTH TWICE A DAY 02/02/19  Yes Wyatt Portela, MD  feeding supplement (BOOST HIGH PROTEIN) LIQD Take 1 Container by mouth 2 (two) times daily between meals.   Yes [provider]  furosemide (LASIX) 40 MG tablet Take 40 mg by mouth 3 (three) times daily.    Yes [provider]  haloperidol (HALDOL) 1 MG tablet 1 to 2 PO QHS 02/03/19  Yes Tanner, Lyndon Code., PA-C  hydrOXYzine  (ATARAX/VISTARIL) 25 MG tablet Take 25 mg by mouth every 6 (six) hours as needed for itching.   Yes [provider]  lidocaine-prilocaine (EMLA) cream Apply 1 application topically as needed. 10/18/18  Yes Wyatt Portela, MD  mirabegron ER (MYRBETRIQ) 25 MG TB24 tablet Take 25 mg by mouth daily.   Yes [provider]  nitroGLYCERIN (NITROSTAT) 0.4 MG SL tablet PLACE 1 TABLET (0.4 MG TOTAL) UNDER THE TONGUE EVERY 5 (FIVE) MINUTES AS NEEDED. 09/27/18 02/08/19 Yes Dunn, Dayna N, PA-C  nystatin (NYSTATIN) powder Apply topically as needed (itching, raw wound).   Yes [provider]  OLANZapine (ZYPREXA) 5 MG tablet Take 1 tablet (5 mg total) by mouth daily. Between 5-6 pm Patient taking differently: Take 2.5-5 mg by mouth daily. Between 5-6 pm 01/11/19  Yes [name], MD  oxyCODONE (OXYCONTIN) 10 mg 12 hr tablet Take 10 mg by mouth every 12 (twelve) hours as needed (pain).   Yes [provider]  oxyCODONE-acetaminophen (PERCOCET) 10-325 MG tablet Take 1 tablet by mouth every 4 (four) hours as needed for pain. Patient taking differently: Take 1-2 tablets by mouth every 4 (four) hours as needed for pain.  01/20/19  Yes Tanner, Lyndon Code., PA-C  PARoxetine (PAXIL) 10 MG tablet Take 1 tablet (10 mg total) by mouth daily. 11/29/18  Yes Cameron Sprang, MD  pramipexole (MIRAPEX) 1 MG tablet TAKE 1 AND 1/2 TABLETS BY MOUTH AT BEDTIME Patient taking differently: Take 2.5 mg by mouth every evening.  06/29/18  Yes [name], MD  Probiotic Product (PROBIOTIC PO) Take 1 tablet by mouth daily.   Yes [provider]  sulfamethoxazole-trimethoprim (BACTRIM DS,SEPTRA DS) 800-160 MG tablet Take 1 tablet by mouth 2 (two) times daily. 02/06/19  Yes [provider]  traMADol (ULTRAM) 50 MG tablet Take 1 tablet (50 mg total) by mouth every 6 (six) hours as needed. 01/20/19  Yes Tanner, Lyndon Code., PA-C  amoxicillin-clavulanate (AUGMENTIN) 875-125 MG tablet Take 1 tablet by mouth  2 (two) times daily. Patient not taking: Reported on 02/08/2019 01/30/19   Sandi Mealy E., PA-C  triamcinolone cream (KENALOG) 0.1 % Apply topically 2 (two) times daily as needed. To the ostomy site for itching Patient not taking: Reported on 02/08/2019 01/11/19   [name], MD   Allergies  Allergen Reactions  . Statins Other (See Comments)    liver effects  . Cyproheptadine Other (See Comments)    Unable to urinate   Review of Systems  Confused.  Denies complaints  Physical Exam  General: Alert, awake, in no acute distress. Confused. HEENT: No goiter, no JVD Abdomen: Nondistended Ext: No significant edema Skin: Warm and dry Neuro: Moves 4 extremities  Vital Signs: BP 130/82 (BP Location: Left Arm)   Pulse (!) 107   Temp 97.7 F (36.5 C)   Resp 16   Ht '5\' 8"'$  (1.727 m)   Wt 87.8 kg   SpO2 99%   BMI 29.43 kg/m  Pain Scale: 0-10   Pain Score: Asleep   SpO2: SpO2: 99 % O2 Device:SpO2: 99 % O2 Flow Rate: .   IO: Intake/output summary:   Intake/Output Summary (Last 24 hours) at 02/09/2019 1757 Last data filed at 02/09/2019 1618 Gross per 24 hour  Intake 657.8 ml  Output 1800 ml  Net -1142.2 ml    LBM:   Baseline Weight: Weight: 87.8 kg Most recent weight: Weight: 87.8 kg     Palliative Assessment/Data:   Flowsheet Rows     Most Recent Value  Intake Tab  Referral Department  Hospitalist  Unit at Time of Referral  ER  Palliative Care Primary Diagnosis  Cancer  Date Notified  02/09/19  Palliative Care Type  Return patient Palliative Care  Reason for referral  Clarify Goals of Care  Date of Admission  02/08/19  # of days IP prior to Palliative referral  1  Clinical Assessment  Psychosocial & Spiritual Assessment  Palliative Care Outcomes     Time Total: 80 Greater than 50%  of this time was spent counseling and coordinating care related to the above assessment and plan.  Signed by: Micheline Rough, MD   Please contact Palliative Medicine Team phone at  [phone] for questions and concerns.  For individual provider: See Shea Evans

## 2019-02-09 NOTE — Progress Notes (Signed)
Responded to VAS Team consult to check PAC. Received report that pt tore IV tubing in half. On assessment, tubing had been disconnected and PAC tubing clamped to preserve PAC access. Access flushes easily with rapid blood return. Able to clear line of blood with rapid flush. Security and sitter present. Pt stated he got up and "had to move" because of pain "all over" especially in legs. Notified floor RN of this. Notified that PAC has no signs mal-position or dislodgement and may be used for ordered medication.

## 2019-02-09 NOTE — Consult Note (Addendum)
Charles Coffey ostomy consult note Pt is familiar to [facility] team from ostomy surgery in Jan 2019.  He states his wife performs pouch changes and emptying when at home Stoma type/location: Stoma is red and viable, slightly above skin level and oval, 1 3/4 inches.  There is slight distention surrounding the stoma; appearance is consistent with a peristomal hernia Peristomal assessment: intact skin surrounding stoma Output: 50cc semiformed brown stool  Ostomy pouching: Removed previous pouch; pt has been using one piece flat pouch prior to admission. Applied one piece flat pouch and barrier ring to assist with maintaining a seal.  Pt was wearing an abd binder prior to admission and this was re-applied.  There were no family members present to discuss plan of care and pt is slightly confused.  2 extra sets of barrier rings and ostomy pouches left at the bedside for staff Coffey use. Please re-consult if further assistance is needed.  Thank-you,  Charles Girt MSN, [place], [place], [place], [place]

## 2019-02-09 NOTE — Progress Notes (Signed)
PT Cancellation Note  Patient Details Name: Charles Coffey MRN: [id number] DOB: 1935-11-29   Cancelled Treatment:    Reason Eval/Treat Not Completed: Other (comment) Dr. Domingo Cocking to see pt shortly.  Will await palliative decisions/discussions and check back tomorrow if PT still indicated.  Thanks.   Raif Chachere,KATHrine E 02/09/2019, 2:28 PM Carmelia Bake, PT, DPT Acute Rehabilitation Services Office: [phone] Pager: [phone]

## 2019-02-09 NOTE — Progress Notes (Signed)
PROGRESS NOTE    Charles Coffey  VPX:[id number] DOB: 12/02/1935 DOA: 02/08/2019 PCP: [name], MD    Brief Narrative:  84 y.o. male with medical history significant of CAD s/p CABG, GERD,metastatic bladder cancer status post suprapubic catheter, nephrostomy tubes, ostomy, dementia with  sundowning  Presented with   agitation and homoccidal ideations.  Patient recently started Bactrim for UTI.  Early in the week she was treated with Augmentin after was found to be febrile up to 102 he was seen by urology and urine sample was taken.  He continued to be febrile Monday Tuesday and Wednesday.  Finally has improved 1 week ago.  He has been having worsening confusion while febrile.  Decreased urinary output.  He actually stopped using his Lasix in the past few days. Patient has been having sundowning and was seen by oncologist who prescribed  Haldol 1 mg instructed to take 1-2 before bedtime.  Yesterday seemed to help with agitation. He has been getting gradually weaker has been having some shortness of breath Family  given  Him a small THC cookie he slept all day and became very agitated. Chasing them with knives. Trying to take out his ostomy bag not understanding why its there. Police was then called and patient was tased by the police.   Assessment & Plan:   Principal Problem:   Agitation Active Problems:   Hyperlipidemia LDL goal <70   HTN (hypertension)   Coronary artery disease of native heart with stable angina pectoris (HCC)   GERD   RLS (restless legs syndrome)   Chronic anemia   CKD (chronic kidney disease) stage 3, GFR 30-59 ml/min (HCC)   Hydronephrosis due to obstructive malignant neoplasm of bladder (HCC)   Elevated troponin   Urothelial carcinoma of bladder (HCC)   Dehydration   Thrombocytopathia (HCC)   Chronic systolic heart failure (HCC)   Lower urinary tract infectious disease   Hyponatremia   AKI (acute kidney injury) ([place])   Delirium   Hypokalemia  CAP (community acquired pneumonia)  Acute encephalopathy with  Agitation/ Delirium In the setting of chronic dementia   - most likely multifactorial secondary to combination of  Infection  mild dehydration secondary to decreased by mouth intake,  polypharmacy as well as administration of THC prior to admit  - Continue IVF hydration as tolerated  - Continued on empiric rocephin  - neurological exam noted to be non-focal at time of presentation    - no history of liver disease ammonia unremarkable  . HTN (hypertension) soft blood pressures noted on presentation -BP meds held at time of presentation -BP stable at present  . CKD (chronic kidney disease) stage 3, GFR 30-59 ml/min (HCC)  -continue to avoid nephrotoxic agents -Repeat bmet in AM  . Hydronephrosis due to obstructive malignant neoplasm of bladder [facility]) - -urology consulted. Appreciate input. -Recommendation for transition from nephroureteral catheters to antegrade stents in preparation for transition to hospice  . Urothelial carcinoma of bladder ([place]) -was in the process of getting hospice set up.   -Palliative Care consulted -Possibility of transition to focus on comfort.   . Chronic systolic heart failure (HCC) avoid fluid overload given soft blood pressures -appears euvolemic at this time  . Hyperlipidemia LDL goal <70  -Noted to be chronic and stable at this time . Coronary artery disease of native heart with stable angina pectoris ([place]) chronic currently not endorsing any chest pain resume home medications when able to tolerate . GERD stable continue home medications  .  RLS (restless legs syndrome) stable continue home medications  . Elevated troponin chronic currently trending down no evidence of acute event on EKG.  Likely demand related of note patient did get tased    . Hyponatremia chronic  -Improved with hydration.   . Lower urinary tract infectious disease  -Pt noted to be febrile at time of  presentation -Continue rocephin empirically. Follow culture results   CAP small infiltrate noted on chest x-ray Rocephin and azithromycin await results of sputum cultures   . Thrombocytopathia ([place]) chronic currently at baseline  . Chronic anemia  -Hemodynamically stable -Pt noted to have refused blood tx at time of presentation  . Hypokalemia low at 3.3. Will replace -Repeat bmet in AM  Colostomy site with bulging colostomy.  Patient have been known to remove his colostomy bag and pulled on the ostomy wife is concerned that he may cause damage.  Admitting physician discussed with general surgery who at this point recommends applying abdominal binder not tightly but for the purposes of concealing the site so that the patient will be less likely to pull on it.   DVT prophylaxis: SCD's Code Status: DNR Family Communication: Pt in room, family at bedside Disposition Plan: Uncertain at this time  Consultants:   Urology  Palliative Care  Procedures:     Antimicrobials: Anti-infectives (From admission, onward)   Start     Dose/Rate Route Frequency Ordered Stop   02/09/19 1800  cefTRIAXone (ROCEPHIN) 1 g in sodium chloride 0.9 % 100 mL IVPB     1 g 200 mL/hr over 30 Minutes Intravenous Every 24 hours 02/08/19 2133     02/08/19 1745  fluconazole (DIFLUCAN) tablet 150 mg     150 mg Oral  Once 02/08/19 1731     02/08/19 1715  cefTRIAXone (ROCEPHIN) 1 g in sodium chloride 0.9 % 100 mL IVPB     1 g 200 mL/hr over 30 Minutes Intravenous  Once 02/08/19 1702 02/08/19 1855   02/08/19 1715  azithromycin (ZITHROMAX) 500 mg in sodium chloride 0.9 % 250 mL IVPB     500 mg 250 mL/hr over 60 Minutes Intravenous  Once 02/08/19 1702 02/08/19 2058       Subjective: Encephalopathic this AM, unable to assess  Objective: Vitals:   02/08/19 2140 02/08/19 2353 02/09/19 0400 02/09/19 1148  BP: 110/64 115/78 125/69 130/82  Pulse: 76 87 77 94  Resp: 18 18 14 16   Temp: 98.3 F (36.8 C)  (!) 96.9 F (36.1 C) (!) 97.5 F (36.4 C) 97.7 F (36.5 C)  TempSrc:  Axillary Axillary   SpO2: 99% 95% 94%   Weight:      Height:        Intake/Output Summary (Last 24 hours) at 02/09/2019 1455 Last data filed at 02/09/2019 1155 Gross per 24 hour  Intake 657.8 ml  Output 1500 ml  Net -842.2 ml   Filed Weights   02/08/19 1444  Weight: 87.8 kg    Examination:  General exam: Appears calm and comfortable  Respiratory system: Clear to auscultation. Respiratory effort normal. Cardiovascular system: S1 & S2 heard, RRR Gastrointestinal system: Abdomen is nondistended, soft and nontender. No organomegaly or masses felt. Normal bowel sounds heard. Central nervous system: Alert and oriented. No focal neurological deficits. Extremities: Symmetric 5 x 5 power. Skin: No rashes, lesions or ulcers Psychiatry: Unable to assess given mentation  Data Reviewed: I have personally reviewed following labs and imaging studies  CBC: Recent Labs  Lab 02/03/19 1037 02/08/19 1610  02/09/19 0327  WBC 6.5 5.9 5.3  NEUTROABS 4.6 4.6  --   HGB 7.3* 7.1* 7.0*  HCT 22.3* 22.2* 21.3*  MCV 97.8 99.6 100.9*  PLT 50* 50* 43*   Basic Metabolic Panel: Recent Labs  Lab 02/03/19 1037 02/08/19 1610 02/08/19 2239 02/09/19 0327  NA 130* 129*  --  133*  K 3.9 3.2*  --  3.3*  CL 96* 97*  --  100  CO2 21* 22  --  21*  GLUCOSE 121* 174*  --  92  BUN 57* 74*  --  71*  CREATININE 2.26* 2.61*  --  2.49*  CALCIUM 9.0 8.9  --  8.8*  MG  --   --  3.0* 2.9*  PHOS  --   --  4.5 4.6   GFR: Estimated Creatinine Clearance: 25.1 mL/min (A) (by C-G formula based on SCr of 2.49 mg/dL (H)). Liver Function Tests: Recent Labs  Lab 02/03/19 1037 02/08/19 1610 02/09/19 0327  AST 37 314* 251*  ALT 31 623* 548*  ALKPHOS 66 78 67  BILITOT 1.7* 1.3* 1.0  PROT 6.9 6.5 6.0*  ALBUMIN 3.2* 3.2* 3.1*   No results for input(s): LIPASE, AMYLASE in the last 168 hours. Recent Labs  Lab 02/08/19 2239  AMMONIA 37*    Coagulation Profile: No results for input(s): INR, PROTIME in the last 168 hours. Cardiac Enzymes: Recent Labs  Lab 02/08/19 2239 02/09/19 0327  CKTOTAL 395  --   TROPONINI 0.06* 0.08*   BNP (last 3 results) No results for input(s): PROBNP in the last 8760 hours. HbA1C: No results for input(s): HGBA1C in the last 72 hours. CBG: No results for input(s): GLUCAP in the last 168 hours. Lipid Profile: No results for input(s): CHOL, HDL, LDLCALC, TRIG, CHOLHDL, LDLDIRECT in the last 72 hours. Thyroid Function Tests: Recent Labs    02/09/19 0327  TSH 1.853   Anemia Panel: No results for input(s): VITAMINB12, FOLATE, FERRITIN, TIBC, IRON, RETICCTPCT in the last 72 hours. Sepsis Labs: Recent Labs  Lab 02/08/19 2239  LATICACIDVEN 1.5    No results found for this or any previous visit (from the past 240 hour(s)).   Radiology Studies: Dg Chest 2 View  Result Date: 02/08/2019 CLINICAL DATA:  Altered mentation and confusion. EXAM: CHEST - 2 VIEW COMPARISON:  11/25/2018 FINDINGS: Stable cardiomegaly with aortic atherosclerosis and post CABG change. Low lung volumes with interstitial edema is noted. Slightly more confluent right perihilar airspace disease is identified. Pneumonia is not excluded versus atelectasis. No significant effusion or pneumothorax. Port catheter tip terminates in the distal SVC. IMPRESSION: 1. Cardiomegaly with aortic atherosclerosis and post CABG change. 2. Low lung volumes with interstitial edema. 3. Slightly more confluent airspace opacity in the right perihilar region. Pneumonia is not excluded. Electronically Signed   By: Ashley Royalty M.D.   On: 02/08/2019 16:44   Ct Head Wo Contrast  Result Date: 02/08/2019 CLINICAL DATA:  Altered level of consciousness, sundowning, history coronary artery disease post CABG and MI, ischemic cardiomyopathy, hypertension, bladder cancer EXAM: CT HEAD WITHOUT CONTRAST TECHNIQUE: Contiguous axial images were obtained from the  base of the skull through the vertex without intravenous contrast. Sagittal and coronal MPR images reconstructed from axial data set. COMPARISON:  None FINDINGS: Brain: Generalized atrophy. Normal ventricular morphology. No midline shift or mass effect. Small vessel chronic ischemic changes of deep cerebral white matter. No intracranial hemorrhage, mass lesion, evidence of acute infarction, or extra-axial fluid collection. Vascular: Atherosclerotic calcifications of internal carotid arteries  at skull base Skull: Intact Sinuses/Orbits: Clear Other: N/A IMPRESSION: Atrophy with small vessel chronic ischemic changes of deep cerebral white matter. No acute intracranial abnormalities. Electronically Signed   By: Lavonia Dana M.D.   On: 02/08/2019 17:06   Ct Renal Stone Study  Result Date: 02/08/2019 CLINICAL DATA:  Hematuria, suspected renal parenchymal cause, UTI treated with Bactrim this week, history coronary artery disease post MI and CABG, bladder cancer, hypertension, ischemic cardiomyopathy, GERD, CHF, former smoker EXAM: CT ABDOMEN AND PELVIS WITHOUT CONTRAST TECHNIQUE: Multidetector CT imaging of the abdomen and pelvis was performed following the standard protocol without IV contrast. Sagittal and coronal MPR images reconstructed from axial data set. Oral contrast was not administered. COMPARISON:  11/06/2018 FINDINGS: Lower chest: BILATERAL pleural effusions, small LEFT and moderate RIGHT. Peribronchial thickening. Minimal atelectasis RIGHT lung base. Hepatobiliary: Gallbladder and liver normal appearance Pancreas: Normal appearance Spleen: Normal appearance Adrenals/Urinary Tract: Adrenal glands normal appearance. Atrophic LEFT kidney. BILATERAL nephrostomy tubes. No definite renal mass. Mild LEFT renal collecting system dilatation. BILATERAL ureteral stents. Mildly thickened bladder wall question due to tumor, cystitis or chronic outlet obstruction suprapubic catheter present. Stomach/Bowel: Transverse  double-barrel colostomy. Stool in RIGHT colon. Decompressed distal colon. Stomach and small bowel loops unremarkable. Vascular/Lymphatic: Atherosclerotic calcifications aorta, iliac arteries, coronary arteries. Aorta normal caliber. No definite abdominal or pelvic adenopathy. Reproductive: Minimal prostatic enlargement. Other: LEFT inguinal hernia containing fat. Scattered subcutaneous and intra-abdominal tissue edema. No free air or free fluid. Minimal presacral stranding. Musculoskeletal: Bones demineralized with prior L3-L4 posterior fusion scattered degenerative disc disease changes. IMPRESSION: BILATERAL nephrostomy tubes and ureteral stents with mild persistent dilatation of the LEFT renal collecting system. Bladder wall thickening which could reflect tumor, L obstruction or cystitis. Small LEFT inguinal hernia containing fat. BILATERAL pleural effusions RIGHT greater than LEFT. Electronically Signed   By: Lavonia Dana M.D.   On: 02/08/2019 17:13    Scheduled Meds: . feeding supplement (ENSURE ENLIVE)  237 mL Oral BID BM  . fluconazole  150 mg Oral Once  . mirabegron ER  25 mg Oral Daily  . multivitamin with minerals  1 tablet Oral Daily  . OLANZapine  5 mg Oral QAC supper  . PARoxetine  10 mg Oral Daily  . pramipexole  2.5 mg Oral BID   Continuous Infusions: . sodium chloride 10 mL/hr at 02/09/19 0500  . cefTRIAXone (ROCEPHIN)  IV       LOS: 0 days   Marylu Lund, MD [facility] Pager On Amion  If 7PM-7AM, please contact night-coverage 02/09/2019, 2:55 PM

## 2019-02-09 NOTE — Consult Note (Signed)
Subjective: Chief complaint: Nephrostomy tube management issues.  History: Charles Coffey is an 84 year old male who I was asked to see in consultation by Dr. Roel Cluck for management of his bilateral nephroureteral catheters as the patient enters hospice care.  He is well-known to me with a history of advanced urothelial carcinoma with bilateral ureteral and bladder outlet obstruction.  He is currently managed with bilateral nephroureteral catheters and a suprapubic tube.  He was admitted to the hospital with dementia and combative agitation and is currently heavily sedated.  There have been issues with the nephrostomy tubes and his tolerance for them with his dementia and we had been discussing antegrade stent placement prior to his recent admission. ROS:  Review of Systems  Unable to perform ROS: Patient unresponsive    Allergies  Allergen Reactions  . Statins Other (See Comments)    liver effects  . Cyproheptadine Other (See Comments)    Unable to urinate    Past Medical History:  Diagnosis Date  . Aortic atherosclerosis ([place])   . BPH with urinary obstruction   . CAD (coronary artery disease)    a.  MI 1995, CABG x 3 2002 (patient says that he had LIMA and RIMA grafts). b. ETT-Cardiolite (10/15) with EF 48%, apical scar, no ischemia. c. Nuc 07/2017 abnormal -> cath was performed,  patent LIMA to LAD and SVG to OM 2. RIMA to RCA is atretic. The right coronary artery is occluded distally with extensive collaterals from the LAD, medical therapy.   . Cancer [facility])    bladder cancer  . Cervical spondylosis without myelopathy May 14, 202017  . Chronic low back pain    sees Dr. Kary Kos   . GERD (gastroesophageal reflux disease)   . Hiatal hernia   . Hyperlipidemia   . Hypertension   . IBS (irritable bowel syndrome)   . Ischemic cardiomyopathy   . Myocardial infarction ([place])   . RBBB   . Restless legs syndrome   . Statin intolerance   . Syncope    a. in 2015 - no apparent cause, was taking  sleep medicine at the time. Cardiac workup unremarkable.  . Tubular adenoma of colon     Past Surgical History:  Procedure Laterality Date  . COLONOSCOPY  10/17/2014   per Dr. Hilarie Fredrickson, tubular adenomas, repeat in 3 yrs   . COLONOSCOPY WITH PROPOFOL N/A 12/01/2017   Procedure: COLONOSCOPY WITH PROPOFOL;  Surgeon: Milus Banister, MD;  Location: [facility];  Service: Endoscopy;  Laterality: N/A;  . CYSTOSCOPY W/ RETROGRADES Left 11/25/2017   Procedure: CYSTOSCOPY WITH RETROGRADE PYELOGRAM;  Surgeon: Irine Seal, MD;  Location: [facility];  Service: Urology;  Laterality: Left;  . HEART BYPASS    . IR CATHETER TUBE CHANGE  11/08/2018  . IR CATHETER TUBE CHANGE  12/26/2018  . IR CATHETER TUBE CHANGE  01/08/2019  . IR CATHETER TUBE CHANGE  01/13/2019  . IR CONVERT LEFT NEPHROSTOMY TO NEPHROURETERAL CATH  09/20/2018  . IR CONVERT RIGHT NEPHROSTOMY TO NEPHROURETERAL CATH  02/04/2018  . IR CONVERT RIGHT NEPHROSTOMY TO NEPHROURETERAL CATH  08/23/2018  . IR EXT NEPHROURETERAL CATH EXCHANGE  04/11/2018  . IR EXT NEPHROURETERAL CATH EXCHANGE  07/04/2018  . IR EXT NEPHROURETERAL CATH EXCHANGE  09/20/2018  . IR EXT NEPHROURETERAL CATH EXCHANGE  11/08/2018  . IR EXT NEPHROURETERAL CATH EXCHANGE  11/08/2018  . IR EXT NEPHROURETERAL CATH EXCHANGE  12/26/2018  . IR EXT NEPHROURETERAL CATH EXCHANGE  12/26/2018  . IR EXT NEPHROURETERAL CATH EXCHANGE  01/17/2019  .  IR FLUORO GUIDE CV LINE RIGHT  12/27/2017  . IR FLUORO GUIDED NEEDLE PLC ASPIRATION/INJECTION LOC  08/19/2018  . IR NEPHROSTOGRAM LEFT THRU EXISTING ACCESS  12/24/2017  . IR NEPHROSTOMY EXCHANGE LEFT  01/28/2018  . IR NEPHROSTOMY EXCHANGE LEFT  02/04/2018  . IR NEPHROSTOMY EXCHANGE LEFT  02/25/2018  . IR NEPHROSTOMY EXCHANGE LEFT  07/04/2018  . IR NEPHROSTOMY EXCHANGE LEFT  07/05/2018  . IR NEPHROSTOMY EXCHANGE LEFT  08/09/2018  . IR NEPHROSTOMY EXCHANGE LEFT  08/16/2018  . IR NEPHROSTOMY EXCHANGE LEFT  08/23/2018  . IR NEPHROSTOMY EXCHANGE RIGHT  12/24/2017  . IR  NEPHROSTOMY EXCHANGE RIGHT  01/28/2018  . IR NEPHROSTOMY EXCHANGE RIGHT  04/11/2018  . IR NEPHROSTOMY EXCHANGE RIGHT  07/05/2018  . IR NEPHROSTOMY EXCHANGE RIGHT  08/09/2018  . IR NEPHROSTOMY EXCHANGE RIGHT  08/19/2018  . IR NEPHROSTOMY PLACEMENT LEFT  11/28/2017  . IR NEPHROSTOMY PLACEMENT RIGHT  12/03/2017  . IR PATIENT EVAL TECH 0-60 MINS  03/24/2018  . IR PATIENT EVAL TECH 0-60 MINS  03/28/2018  . IR PATIENT EVAL TECH 0-60 MINS  04/25/2018  . IR PATIENT EVAL TECH 0-60 MINS  06/20/2018  . IR US GUIDE VASC ACCESS RIGHT  12/27/2017  . LAPAROSCOPY N/A 12/01/2017   Procedure: LAPAROSCOPIC DIVERTING OSTOMY;  Surgeon: Stark Klein, MD;  Location: [facility];  Service: General;  Laterality: N/A;  . LEFT HEART CATH AND CORS/GRAFTS ANGIOGRAPHY N/A 08/25/2017   Procedure: LEFT HEART CATH AND CORS/GRAFTS ANGIOGRAPHY;  Surgeon: Wellington Hampshire, MD;  Location: [place] [facility];  Service: Cardiovascular;  Laterality: N/A;  . LUMBAR FUSION  2003   L3-L4  . PROSTATE SURGERY  06-27-12   per Dr. Roni Bread, had CTT  . TONSILLECTOMY    . TRANSURETHRAL RESECTION OF BLADDER TUMOR N/A 11/25/2017   Procedure: TRANSURETHRAL RESECTION OF BLADDER TUMOR (TURBT);  Surgeon: Irine Seal, MD;  Location: [facility];  Service: Urology;  Laterality: N/A;    Social History   Socioeconomic History  . Marital status: Married    Spouse name: Not on file  . Number of children: 5  . Years of education: Some coll  . Highest education level: Not on file  Occupational History  . Occupation: retired  Scientific laboratory technician  . Financial resource strain: Not on file  . Food insecurity:    Worry: Not on file    Inability: Not on file  . Transportation needs:    Medical: Not on file    Non-medical: Not on file  Tobacco Use  . Smoking status: Former Smoker    Types: Cigarettes    Last attempt to quit: 12/28/1978    Years since quitting: 40.1  . Smokeless tobacco: Never Used  Substance and Sexual Activity  . Alcohol use: Not Currently     Alcohol/week: 2.0 standard drinks    Types: 1 Glasses of wine, 1 Cans of beer per week  . Drug use: No  . Sexual activity: Not Currently  Lifestyle  . Physical activity:    Days per week: Not on file    Minutes per session: Not on file  . Stress: Not on file  Relationships  . Social connections:    Talks on phone: Not on file    Gets together: Not on file    Attends religious service: Not on file    Active member of club or organization: Not on file    Attends meetings of clubs or organizations: Not on file    Relationship status: Not on  file  . Intimate partner violence:    Fear of current or ex partner: Not on file    Emotionally abused: Not on file    Physically abused: Not on file    Forced sexual activity: Not on file  Other Topics Concern  . Not on file  Social History Narrative   Lives at home w/ his wife   Right-handed   5 cups of coffee per day    Family History  Problem Relation Age of Onset  . Heart attack Mother   . Aneurysm Father        femoral artery  . Heart disease Brother   . Heart disease Maternal Uncle        x 2  . Colon cancer Neg Hx   . Esophageal cancer Neg Hx   . Pancreatic cancer Neg Hx   . Kidney disease Neg Hx   . Liver disease Neg Hx     Anti-infectives: Anti-infectives (From admission, onward)   Start     Dose/Rate Route Frequency Ordered Stop   02/09/19 1800  cefTRIAXone (ROCEPHIN) 1 g in sodium chloride 0.9 % 100 mL IVPB     1 g 200 mL/hr over 30 Minutes Intravenous Every 24 hours 02/08/19 2133     02/08/19 1745  fluconazole (DIFLUCAN) tablet 150 mg     150 mg Oral  Once 02/08/19 1731     02/08/19 1715  cefTRIAXone (ROCEPHIN) 1 g in sodium chloride 0.9 % 100 mL IVPB     1 g 200 mL/hr over 30 Minutes Intravenous  Once 02/08/19 1702 02/08/19 1855   02/08/19 1715  azithromycin (ZITHROMAX) 500 mg in sodium chloride 0.9 % 250 mL IVPB     500 mg 250 mL/hr over 60 Minutes Intravenous  Once 02/08/19 1702 02/08/19 2058      Current  Facility-Administered Medications  Medication Dose Route Frequency Provider Last Rate Last Dose  . 0.9 %  sodium chloride infusion   Intravenous PRN Toy Baker, MD 10 mL/hr at 02/09/19 0500    . acetaminophen (TYLENOL) tablet 650 mg  650 mg Oral Q6H PRN Toy Baker, MD       Or  . acetaminophen (TYLENOL) suppository 650 mg  650 mg Rectal Q6H PRN Doutova, Anastassia, MD      . cefTRIAXone (ROCEPHIN) 1 g in sodium chloride 0.9 % 100 mL IVPB  1 g Intravenous Q24H Doutova, Anastassia, MD      . fluconazole (DIFLUCAN) tablet 150 mg  150 mg Oral Once Toy Baker, MD   Stopped at 02/08/19 1935  . haloperidol (HALDOL) tablet 1 mg  1 mg Oral QHS PRN Toy Baker, MD      . mirabegron ER (MYRBETRIQ) tablet 25 mg  25 mg Oral Daily Doutova, Anastassia, MD      . OLANZapine (ZYPREXA) tablet 5 mg  5 mg Oral QAC supper Toy Baker, MD   5 mg at 02/08/19 2339  . ondansetron (ZOFRAN) tablet 4 mg  4 mg Oral Q6H PRN Toy Baker, MD       Or  . ondansetron (ZOFRAN) injection 4 mg  4 mg Intravenous Q6H PRN Doutova, Anastassia, MD      . oxyCODONE-acetaminophen (PERCOCET/ROXICET) 5-325 MG per tablet 1-2 tablet  1-2 tablet Oral Q4H PRN Toy Baker, MD   2 tablet at 02/08/19 2340   And  . oxyCODONE (Oxy IR/ROXICODONE) immediate release tablet 5-10 mg  5-10 mg Oral Q4H PRN Toy Baker, MD      .  PARoxetine (PAXIL) tablet 10 mg  10 mg Oral Daily Doutova, Anastassia, MD      . pramipexole (MIRAPEX) tablet 2.5 mg  2.5 mg Oral QPM Doutova, Anastassia, MD   2.5 mg at 02/08/19 2340  . sodium chloride flush (NS) 0.9 % injection 10-40 mL  10-40 mL Intracatheter PRN Toy Baker, MD   20 mL at 02/09/19 0335     Objective: Vital signs in last 24 hours: Temp:  [96.9 F (36.1 C)-98.3 F (36.8 C)] 97.5 F (36.4 C) (02/13 0400) Pulse Rate:  [38-87] 77 (02/13 0400) Resp:  [13-20] 14 (02/13 0400) BP: (90-125)/(51-78) 125/69 (02/13 0400) SpO2:  [91 %-100  %] 94 % (02/13 0400) Weight:  [87.8 kg] 87.8 kg (02/12 1444)  Intake/Output from previous day: 02/12 0701 - 02/13 0700 In: 657.8 [I.V.:307.8; IV Piggyback:350] Out: 650 [Urine:650] Intake/Output this shift: No intake/output data recorded.   Physical Exam Vitals signs reviewed.  Constitutional:      Comments: He is heavily sedated and in restraints.  Abdominal:     Comments: There are bilateral nephroureteral catheters and a suprapubic tube.  He has a left lower quadrant colostomy with a parastomal hernia.     Lab Results:  Recent Labs    02/08/19 1610 02/09/19 0327  WBC 5.9 5.3  HGB 7.1* 7.0*  HCT 22.2* 21.3*  PLT 50* 43*   BMET Recent Labs    02/08/19 1610 02/09/19 0327  NA 129* 133*  K 3.2* 3.3*  CL 97* 100  CO2 22 21*  GLUCOSE 174* 92  BUN 74* 71*  CREATININE 2.61* 2.49*  CALCIUM 8.9 8.8*   PT/INR No results for input(s): LABPROT, INR in the last 72 hours. ABG No results for input(s): PHART, HCO3 in the last 72 hours.  Invalid input(s): PCO2, PO2  Studies/Results: Dg Chest 2 View  Result Date: 02/08/2019 CLINICAL DATA:  Altered mentation and confusion. EXAM: CHEST - 2 VIEW COMPARISON:  11/25/2018 FINDINGS: Stable cardiomegaly with aortic atherosclerosis and post CABG change. Low lung volumes with interstitial edema is noted. Slightly more confluent right perihilar airspace disease is identified. Pneumonia is not excluded versus atelectasis. No significant effusion or pneumothorax. Port catheter tip terminates in the distal SVC. IMPRESSION: 1. Cardiomegaly with aortic atherosclerosis and post CABG change. 2. Low lung volumes with interstitial edema. 3. Slightly more confluent airspace opacity in the right perihilar region. Pneumonia is not excluded. Electronically Signed   By: Ashley Royalty M.D.   On: 02/08/2019 16:44   Ct Head Wo Contrast  Result Date: 02/08/2019 CLINICAL DATA:  Altered level of consciousness, sundowning, history coronary artery disease post  CABG and MI, ischemic cardiomyopathy, hypertension, bladder cancer EXAM: CT HEAD WITHOUT CONTRAST TECHNIQUE: Contiguous axial images were obtained from the base of the skull through the vertex without intravenous contrast. Sagittal and coronal MPR images reconstructed from axial data set. COMPARISON:  None FINDINGS: Brain: Generalized atrophy. Normal ventricular morphology. No midline shift or mass effect. Small vessel chronic ischemic changes of deep cerebral white matter. No intracranial hemorrhage, mass lesion, evidence of acute infarction, or extra-axial fluid collection. Vascular: Atherosclerotic calcifications of internal carotid arteries at skull base Skull: Intact Sinuses/Orbits: Clear Other: N/A IMPRESSION: Atrophy with small vessel chronic ischemic changes of deep cerebral white matter. No acute intracranial abnormalities. Electronically Signed   By: Lavonia Dana M.D.   On: 02/08/2019 17:06   Ct Renal Stone Study  Result Date: 02/08/2019 CLINICAL DATA:  Hematuria, suspected renal parenchymal cause, UTI treated with Bactrim this  week, history coronary artery disease post MI and CABG, bladder cancer, hypertension, ischemic cardiomyopathy, GERD, CHF, former smoker EXAM: CT ABDOMEN AND PELVIS WITHOUT CONTRAST TECHNIQUE: Multidetector CT imaging of the abdomen and pelvis was performed following the standard protocol without IV contrast. Sagittal and coronal MPR images reconstructed from axial data set. Oral contrast was not administered. COMPARISON:  11/06/2018 FINDINGS: Lower chest: BILATERAL pleural effusions, small LEFT and moderate RIGHT. Peribronchial thickening. Minimal atelectasis RIGHT lung base. Hepatobiliary: Gallbladder and liver normal appearance Pancreas: Normal appearance Spleen: Normal appearance Adrenals/Urinary Tract: Adrenal glands normal appearance. Atrophic LEFT kidney. BILATERAL nephrostomy tubes. No definite renal mass. Mild LEFT renal collecting system dilatation. BILATERAL ureteral  stents. Mildly thickened bladder wall question due to tumor, cystitis or chronic outlet obstruction suprapubic catheter present. Stomach/Bowel: Transverse double-barrel colostomy. Stool in RIGHT colon. Decompressed distal colon. Stomach and small bowel loops unremarkable. Vascular/Lymphatic: Atherosclerotic calcifications aorta, iliac arteries, coronary arteries. Aorta normal caliber. No definite abdominal or pelvic adenopathy. Reproductive: Minimal prostatic enlargement. Other: LEFT inguinal hernia containing fat. Scattered subcutaneous and intra-abdominal tissue edema. No free air or free fluid. Minimal presacral stranding. Musculoskeletal: Bones demineralized with prior L3-L4 posterior fusion scattered degenerative disc disease changes. IMPRESSION: BILATERAL nephrostomy tubes and ureteral stents with mild persistent dilatation of the LEFT renal collecting system. Bladder wall thickening which could reflect tumor, L obstruction or cystitis. Small LEFT inguinal hernia containing fat. BILATERAL pleural effusions RIGHT greater than LEFT. Electronically Signed   By: Lavonia Dana M.D.   On: 02/08/2019 17:13   I have reviewed his recent CT scan which demonstrates good position of his nephroureteral catheters and suprapubic tube.  I have reviewed his labs and hospital notes.  Assessment: Advanced urothelial carcinoma with bilateral ureteral and bladder outlet obstruction.  We will discuss conversion of his nephroureteral catheters to antegrade stents in preparation for his transition to hospice care.   CC: Dr. Leonie Man.     Irine Seal 02/09/2019 [phone]

## 2019-02-09 NOTE — Progress Notes (Signed)
OT Cancellation Note  Patient Details Name: Charles Coffey MRN: [id number] DOB: [birth year]   Cancelled Treatment:    Reason Eval/Treat Not Completed: Other (comment).  Pt is not ready for OT today per RN. Will check back tomorrow  Miran Kautzman 02/09/2019, 1:47 PM  Lesle Chris, OTR/L Acute Rehabilitation Services [phone] [facility] pager [phone] office 02/09/2019

## 2019-02-09 NOTE — Consult Note (Signed)
   [facility] Inpatient Consult   02/09/2019  Charles Coffey 09-16-1935 [id number]   Patient screened for potential [facility] Care Management services due to unplanned readmission risk score of 45% (extreme).  Spoke with inpatient team. Palliative consult is pending.  No identifiable [facility] Care Management needs at this time.   Marthenia Rolling, MSN-Ed, RN,BSN [facility] Liaison [phone]

## 2019-02-10 LAB — URINE CULTURE

## 2019-02-10 MED ORDER — SODIUM CHLORIDE 0.9 % IV SOLN
500.0000 mg | INTRAVENOUS | Status: DC
  Administered 2019-02-10: 500 mg via INTRAVENOUS
  Filled 2019-02-10 (×3): qty 500

## 2019-02-10 MED ORDER — HYDROMORPHONE HCL 1 MG/ML IJ SOLN
0.5000 mg | INTRAMUSCULAR | Status: DC | PRN
  Administered 2019-02-13: 1 mg via INTRAVENOUS
  Filled 2019-02-10: qty 1

## 2019-02-10 MED ORDER — MORPHINE SULFATE (PF) 2 MG/ML IV SOLN
2.0000 mg | Freq: Once | INTRAVENOUS | Status: AC
  Administered 2019-02-10: 2 mg via INTRAVENOUS
  Filled 2019-02-10: qty 1

## 2019-02-10 MED ORDER — SODIUM CHLORIDE 0.9 % IV SOLN
2.0000 g | INTRAVENOUS | Status: DC
  Administered 2019-02-10 – 2019-02-11 (×2): 2 g via INTRAVENOUS
  Filled 2019-02-10 (×3): qty 2

## 2019-02-10 NOTE — Progress Notes (Signed)
OT Cancellation Note  Patient Details Name: LEMONTE AL MRN: [id number] DOB: 1935-10-16   Cancelled Treatment:    Reason Eval/Treat Not Completed: Other (comment).  Not ready per nursing. Will try to check back Monday.  Jenny Omdahl 02/10/2019, 11:32 AM  Lesle Chris, OTR/L Acute Rehabilitation Services [phone] [facility] pager [phone] office 02/10/2019
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
## 2019-02-10 NOTE — Progress Notes (Signed)
Sitter called for assistance as pt seen coming out of room door down hallway.  Called for pt to stop, multiple nurses came to assist.  Pt states he's not going back in his room.  States he needs to get out of here.  Pt has tore IV tubing apart, with blood noted down tubing.  Tubing clamped. Securtiey called.   Walker given to patient and he agreed to ambulate in hallway.  Pt was finally able to be guided back to his room and placed in bed.  Medication was given for pain, see MAR.  Will continue to monitor.
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
## 2019-02-10 NOTE — Progress Notes (Signed)
PT Cancellation Note  Patient Details Name: Charles Coffey MRN: [id number] DOB: [birth year]   Cancelled Treatment:    Reason Eval/Treat Not Completed: PT screened, no needs identified, will sign off Per Dr. Domingo Cocking note, "His wife reports that plan has been to internalize nephrostomy tubes and then transition to hospice."  Palliative to meet with family when son in town.   Per nursing notes, pt is ambulating around unit with RW.  Will defer further needs to palliative care.   Please reorder if physical therapy is indicated after further palliative and hospice discussions with pt and family.  Thank you.  PT to sign off at this time.   Marda Breidenbach,KATHrine E 02/10/2019, 3:32 PM Carmelia Bake, PT, DPT Acute Rehabilitation Services Office: [phone] Pager: [phone]

## 2019-02-11 ENCOUNTER — Other Ambulatory Visit: Payer: Self-pay

## 2019-02-11 LAB — BASIC METABOLIC PANEL
Anion gap: 7 (ref 5–15)
BUN: 40 mg/dL — AB (ref 8–23)
CO2: 23 mmol/L (ref 22–32)
Calcium: 9.1 mg/dL (ref 8.9–10.3)
Chloride: 106 mmol/L (ref 98–111)
Creatinine, Ser: 1.88 mg/dL — ABNORMAL HIGH (ref 0.61–1.24)
GFR calc Af Amer: 38 mL/min — ABNORMAL LOW (ref >60)
GFR calc non Af Amer: 33 mL/min — ABNORMAL LOW (ref >60)
Glucose, Bld: 98 mg/dL (ref 70–99)
Potassium: 4 mmol/L (ref 3.5–5.1)
SODIUM: 136 mmol/L (ref 135–145)

## 2019-02-11 MED ORDER — HYDROXYZINE HCL 10 MG PO TABS
10.0000 mg | ORAL_TABLET | Freq: Three times a day (TID) | ORAL | Status: DC | PRN
  Administered 2019-02-12 – 2019-02-13 (×3): 10 mg via ORAL
  Filled 2019-02-11 (×3): qty 1

## 2019-02-11 MED ORDER — AZITHROMYCIN 250 MG PO TABS
500.0000 mg | ORAL_TABLET | Freq: Every day | ORAL | Status: DC
  Administered 2019-02-11 – 2019-02-14 (×4): 500 mg via ORAL
  Filled 2019-02-11 (×4): qty 2

## 2019-02-11 MED ORDER — FLUCONAZOLE 150 MG PO TABS
150.0000 mg | ORAL_TABLET | Freq: Once | ORAL | Status: AC
  Administered 2019-02-11: 150 mg via ORAL
  Filled 2019-02-11: qty 1
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
## 2019-02-11 NOTE — Progress Notes (Signed)
PROGRESS NOTE    Charles Coffey  ZOX:[id number] DOB: 1935-01-03 DOA: 02/08/2019 PCP: [name], MD    Brief Narrative:  84 y.o. male with medical history significant of CAD s/p CABG, GERD,metastatic bladder cancer status post suprapubic catheter, nephrostomy tubes, ostomy, dementia with  sundowning  Presented with   agitation and homoccidal ideations.  Patient recently started Bactrim for UTI.  Early in the week she was treated with Augmentin after was found to be febrile up to 102 he was seen by urology and urine sample was taken.  He continued to be febrile Monday Tuesday and Wednesday.  Finally has improved 1 week ago.  He has been having worsening confusion while febrile.  Decreased urinary output.  He actually stopped using his Lasix in the past few days. Patient has been having sundowning and was seen by oncologist who prescribed  Haldol 1 mg instructed to take 1-2 before bedtime.  Yesterday seemed to help with agitation. He has been getting gradually weaker has been having some shortness of breath Family  given  Him a small THC cookie he slept all day and became very agitated. Chasing them with knives. Trying to take out his ostomy bag not understanding why its there. Police was then called and patient was tased by the police.   Assessment & Plan:   Principal Problem:   Delirium Active Problems:   Hyperlipidemia LDL goal <70   HTN (hypertension)   Coronary artery disease of native heart with stable angina pectoris (HCC)   GERD   RLS (restless legs syndrome)   Chronic anemia   CKD (chronic kidney disease) stage 3, GFR 30-59 ml/min (HCC)   Hydronephrosis due to obstructive malignant neoplasm of bladder (HCC)   Elevated troponin   Urothelial carcinoma of bladder (HCC)   Dehydration   Thrombocytopathia (HCC)   Chronic systolic heart failure (HCC)   Lower urinary tract infectious disease   Hyponatremia   Agitation   AKI (acute kidney injury) ([place])   Hypokalemia  CAP (community acquired pneumonia)  Acute encephalopathy with  Agitation/ Delirium In the setting of chronic dementia   - most likely multifactorial secondary to combination of  Infection  mild dehydration secondary to decreased by mouth intake,  polypharmacy as well as administration of THC prior to admit  - Continue IVF hydration as tolerated  - Continued on empiric azithromycin and cefepime  - neurological exam noted to be non-focal at time of presentation    - no history of liver disease and ammonia noted to be unremarkable - Appreciate input by Psychiatry. Currently on increased zyprexa dose at 7.5mg  qhs with 2.5mg  PRN. If later ineffective, then recommendation for transition to Saphris 2.5mg  BID -Much improved mentation today. Pt alert, conversant, pleasant  . HTN (hypertension) soft blood pressures noted on presentation -BP meds held at time of presentation -BP remains stable currently. Vitals reviewed  . CKD (chronic kidney disease) stage 3, GFR 30-59 ml/min (HCC)  -continue to avoid nephrotoxic agents -Repeat bmet in AM  . Hydronephrosis due to obstructive malignant neoplasm of bladder [facility]) - -urology consulted. Appreciate input. -Recommendation for transition from nephroureteral catheters to antegrade stents in preparation for transition to hospice -Discussed with IR. Tentative plan for stent placement beginning of week  . Urothelial carcinoma of bladder ([place]) -was in the process of getting hospice set up.   -Palliative Care consulted and is following   . Chronic systolic heart failure (HCC) avoid fluid overload given soft blood pressures -appears euvolemic presently -  Presently stable  . Hyperlipidemia LDL goal <70  -Noted to be chronic, presently stable  . Coronary artery disease of native heart with stable angina pectoris ([place]) chronic currently not endorsing any chest pain resume home medications when able to tolerate -Currently stable  . GERD stable continue home  medications  . RLS (restless legs syndrome) stable continue home medications -stable at this time  . Elevated troponin chronic currently trending down no evidence of acute event on EKG.  Likely demand related of note patient did get tased    . Hyponatremia chronic  -Improved with hydration, currently stable  . Lower urinary tract infectious disease  -Pt noted to be febrile at time of presentation -Initially continued on rocephin empirically.  -Recently discussed with Dr. [name] regarding outpatient cutlure results, appreciate input. Pt noted to have Enterobacter cloacae species. Continued on cefepime based on culture results   CAP small infiltrate noted on chest x-ray, reviewed -Continue on azithromycin and cefepime per above  . Thrombocytopathia ([place]) chronic currently at baseline -Stable at present  . Chronic anemia  -Hemodynamically stable -Pt noted to have refused blood tx at time of presentation  . Hypokalemia normalized. Repeat bmet in AM  Colostomy site with bulging colostomy.  Patient have been known to remove his colostomy bag and pulled on the ostomy wife is concerned that he may cause damage.  Admitting physician discussed with general surgery who at this point recommends applying abdominal binder not tightly but for the purposes of concealing the site so that the patient will be less likely to pull on it.  -Remains stable at present  DVT prophylaxis: SCD's Code Status: DNR Family Communication: Pt in room, family at bedside Disposition Plan: Uncertain at this time  Consultants:   Urology  Palliative Care  Procedures:     Antimicrobials: Anti-infectives (From admission, onward)   Start     Dose/Rate Route Frequency Ordered Stop   02/11/19 2200  azithromycin (ZITHROMAX) tablet 500 mg     500 mg Oral Daily at bedtime 02/11/19 0956     02/11/19 1200  fluconazole (DIFLUCAN) tablet 150 mg     150 mg Oral  Once 02/11/19 0959 02/11/19 1230   02/10/19 1700   azithromycin (ZITHROMAX) 500 mg in sodium chloride 0.9 % 250 mL IVPB  Status:  Discontinued     500 mg 250 mL/hr over 60 Minutes Intravenous Every 24 hours 02/10/19 1539 02/11/19 0956   02/10/19 1630  ceFEPIme (MAXIPIME) 2 g in sodium chloride 0.9 % 100 mL IVPB     2 g 200 mL/hr over 30 Minutes Intravenous Every 24 hours 02/10/19 1540     02/09/19 1800  cefTRIAXone (ROCEPHIN) 1 g in sodium chloride 0.9 % 100 mL IVPB  Status:  Discontinued     1 g 200 mL/hr over 30 Minutes Intravenous Every 24 hours 02/08/19 2133 02/10/19 1537   02/08/19 1745  fluconazole (DIFLUCAN) tablet 150 mg     150 mg Oral  Once 02/08/19 1731     02/08/19 1715  cefTRIAXone (ROCEPHIN) 1 g in sodium chloride 0.9 % 100 mL IVPB     1 g 200 mL/hr over 30 Minutes Intravenous  Once 02/08/19 1702 02/08/19 1855   02/08/19 1715  azithromycin (ZITHROMAX) 500 mg in sodium chloride 0.9 % 250 mL IVPB     500 mg 250 mL/hr over 60 Minutes Intravenous  Once 02/08/19 1702 02/08/19 2058      Subjective: Awake, conversant, and pleasant  Objective: Vitals:  02/10/19 2000 02/10/19 2035 02/11/19 0551 02/11/19 1455  BP: 127/75 127/75 115/82 117/79  Pulse:  97 97 87  Resp:  20 20 15   Temp:  (!) 97.4 F (36.3 C) 98.5 F (36.9 C) 98.7 F (37.1 C)  TempSrc:  Oral  Oral  SpO2:  96% 98% 99%  Weight:      Height:        Intake/Output Summary (Last 24 hours) at 02/11/2019 1627 Last data filed at 02/11/2019 1400 Gross per 24 hour  Intake 1137.43 ml  Output 1025 ml  Net 112.43 ml   Filed Weights   02/08/19 1444  Weight: 87.8 kg    Examination: General exam: Conversant, in no acute distress Respiratory system: normal chest rise, clear, no audible wheezing Cardiovascular system: regular rhythm, s1-s2 Gastrointestinal system: Nondistended, nontender, pos BS Central nervous system: No seizures, no tremors Extremities: No cyanosis, no joint deformities Skin: No rashes, no pallor Psychiatry: Affect normal // no auditory  hallucinations     Data Reviewed: I have personally reviewed following labs and imaging studies  CBC: Recent Labs  Lab 02/08/19 1610 02/09/19 0327  WBC 5.9 5.3  NEUTROABS 4.6  --   HGB 7.1* 7.0*  HCT 22.2* 21.3*  MCV 99.6 100.9*  PLT 50* 43*   Basic Metabolic Panel: Recent Labs  Lab 02/08/19 1610 02/08/19 2239 02/09/19 0327 02/11/19 0347  NA 129*  --  133* 136  K 3.2*  --  3.3* 4.0  CL 97*  --  100 106  CO2 22  --  21* 23  GLUCOSE 174*  --  92 98  BUN 74*  --  71* 40*  CREATININE 2.61*  --  2.49* 1.88*  CALCIUM 8.9  --  8.8* 9.1  MG  --  3.0* 2.9*  --   PHOS  --  4.5 4.6  --    GFR: Estimated Creatinine Clearance: 33.2 mL/min (A) (by C-G formula based on SCr of 1.88 mg/dL (H)). Liver Function Tests: Recent Labs  Lab 02/08/19 1610 02/09/19 0327  AST 314* 251*  ALT 623* 548*  ALKPHOS 78 67  BILITOT 1.3* 1.0  PROT 6.5 6.0*  ALBUMIN 3.2* 3.1*   No results for input(s): LIPASE, AMYLASE in the last 168 hours. Recent Labs  Lab 02/08/19 2239  AMMONIA 37*   Coagulation Profile: No results for input(s): INR, PROTIME in the last 168 hours. Cardiac Enzymes: Recent Labs  Lab 02/08/19 2239 02/09/19 0327  CKTOTAL 395  --   TROPONINI 0.06* 0.08*   BNP (last 3 results) No results for input(s): PROBNP in the last 8760 hours. HbA1C: No results for input(s): HGBA1C in the last 72 hours. CBG: No results for input(s): GLUCAP in the last 168 hours. Lipid Profile: No results for input(s): CHOL, HDL, LDLCALC, TRIG, CHOLHDL, LDLDIRECT in the last 72 hours. Thyroid Function Tests: Recent Labs    02/09/19 0327  TSH 1.853   Anemia Panel: No results for input(s): VITAMINB12, FOLATE, FERRITIN, TIBC, IRON, RETICCTPCT in the last 72 hours. Sepsis Labs: Recent Labs  Lab 02/08/19 2239  LATICACIDVEN 1.5    Recent Results (from the past 240 hour(s))  Urine culture     Status: Abnormal   Collection Time: 02/08/19  3:57 PM  Result Value Ref Range Status   Specimen  Description   Final    URINE, RANDOM Performed at [facility] [address] [place], [place] [address]    Special Requests   Final    NONE  Performed at [facility], [place] [address], [place], [place] [address]    Culture 50,000 COLONIES/mL YEAST (A)  Final   Report Status 02/10/2019 FINAL  Final  Blood culture (routine x 2)     Status: None (Preliminary result)   Collection Time: 02/08/19 10:39 PM  Result Value Ref Range Status   Specimen Description   Final    BLOOD RIGHT ARM Performed at [facility] [address] [place], [place] [address]    Special Requests   Final    BOTTLES DRAWN AEROBIC ONLY Blood Culture adequate volume Performed at [facility] [address], [place], [place] [address]    Culture   Final    NO GROWTH 2 DAYS Performed at [facility] [address], [place], [place] [address]    Report Status PENDING  Incomplete  Blood culture (routine x 2)     Status: None (Preliminary result)   Collection Time: 02/08/19 10:39 PM  Result Value Ref Range Status   Specimen Description   Final    BLOOD LEFT HAND Performed at [facility] [address], [place], [place] [address]    Special Requests   Final    BOTTLES DRAWN AEROBIC AND ANAEROBIC Blood Culture adequate volume Performed at [facility] [address], [place], [place] [address]    Culture   Final    NO GROWTH 2 DAYS Performed at [facility] [address] [place], [place] [address]    Report Status PENDING  Incomplete  MRSA PCR Screening     Status: Abnormal   Collection Time: 02/09/19  4:00 PM  Result Value Ref Range Status   MRSA by PCR POSITIVE (A) NEGATIVE Final    Comment:        The GeneXpert MRSA Assay (FDA approved for NASAL specimens only), is one component of a comprehensive MRSA colonization surveillance program. It is not intended to diagnose  MRSA infection nor to guide or monitor treatment for MRSA infections. RESULT CALLED TO, READ BACK BY AND VERIFIED WITH: K.DUNKELBERGER AT 1911 ON 02/09/19 BY N.THOMPSON Performed at [facility], [place] [address] [place], [place] [address]      Radiology Studies: No results found.  Scheduled Meds: . azithromycin  500 mg Oral QHS  . Chlorhexidine Gluconate Cloth  6 each Topical Q0600  . feeding supplement (ENSURE ENLIVE)  237 mL Oral BID BM  . fluconazole  150 mg Oral Once  . mirabegron ER  25 mg Oral Daily  . multivitamin with minerals  1 tablet Oral Daily  . mupirocin ointment  1 application Nasal BID  . OLANZapine  7.5 mg Oral QAC supper  . PARoxetine  10 mg Oral Daily  . pramipexole  2.5 mg Oral BID   Continuous Infusions: . sodium chloride 10 mL/hr at 02/09/19 0500  . ceFEPime (MAXIPIME) IV 2 g (02/11/19 1623)     LOS: 2 days   Marylu Lund, MD [facility] Pager On Amion  If 7PM-7AM, please contact night-coverage 02/11/2019, 4:27 PM

## 2019-02-11 NOTE — Progress Notes (Signed)
PHARMACIST - PHYSICIAN COMMUNICATION  CONCERNING: Antibiotic IV to Oral Route Change Policy  RECOMMENDATION: This patient is receiving azithromycin by the intravenous route.  Based on criteria approved by the Pharmacy and Therapeutics Committee, the antibiotic(s) is/are being converted to the equivalent oral dose form(s).   DESCRIPTION: These criteria include:  Patient being treated for a respiratory tract infection, urinary tract infection, cellulitis or clostridium difficile associated diarrhea if on metronidazole  The patient is not neutropenic and does not exhibit a GI malabsorption state  The patient is eating (either orally or via tube) and/or has been taking other orally administered medications for a least 24 hours  The patient is improving clinically and has a Tmax < 100.5  If you have questions about this conversion, please contact the Pharmacy Department  []   [phone] )  [facility] []   [phone] )  [facility] []   [phone] )  [facility] []   [phone] )  [facility] [x]   [phone] )  [place], [place].D 02/11/2019 9:56 AM

## 2019-02-12 LAB — BASIC METABOLIC PANEL
Anion gap: 9 (ref 5–15)
BUN: 33 mg/dL — ABNORMAL HIGH (ref 8–23)
CO2: 22 mmol/L (ref 22–32)
Calcium: 9.1 mg/dL (ref 8.9–10.3)
Chloride: 106 mmol/L (ref 98–111)
Creatinine, Ser: 1.67 mg/dL — ABNORMAL HIGH (ref 0.61–1.24)
GFR calc non Af Amer: 38 mL/min — ABNORMAL LOW (ref >60)
GFR, EST AFRICAN AMERICAN: 44 mL/min — AB (ref >60)
Glucose, Bld: 110 mg/dL — ABNORMAL HIGH (ref 70–99)
Potassium: 4.1 mmol/L (ref 3.5–5.1)
Sodium: 137 mmol/L (ref 135–145)

## 2019-02-12 LAB — CBC
HCT: 24.2 % — ABNORMAL LOW (ref 39.0–52.0)
HEMOGLOBIN: 7.5 g/dL — AB (ref 13.0–17.0)
MCH: 33.3 pg (ref 26.0–34.0)
MCHC: 31 g/dL (ref 30.0–36.0)
MCV: 107.6 fL — ABNORMAL HIGH (ref 80.0–100.0)
NRBC: 0 % (ref 0.0–0.2)
Platelets: 35 10*3/uL — ABNORMAL LOW (ref 150–400)
RBC: 2.25 MIL/uL — ABNORMAL LOW (ref 4.22–5.81)
RDW: 23 % — ABNORMAL HIGH (ref 11.5–15.5)
WBC: 6.4 10*3/uL (ref 4.0–10.5)

## 2019-02-12 MED ORDER — SODIUM CHLORIDE 0.9 % IV SOLN
2.0000 g | Freq: Two times a day (BID) | INTRAVENOUS | Status: DC
  Administered 2019-02-12 – 2019-02-15 (×7): 2 g via INTRAVENOUS
  Filled 2019-02-12 (×8): qty 2

## 2019-02-12 MED ORDER — FAMOTIDINE 20 MG PO TABS
20.0000 mg | ORAL_TABLET | Freq: Two times a day (BID) | ORAL | Status: DC
  Administered 2019-02-12 – 2019-02-21 (×19): 20 mg via ORAL
  Filled 2019-02-12 (×19): qty 1

## 2019-02-12 MED ORDER — ALUM & MAG HYDROXIDE-SIMETH 200-200-20 MG/5ML PO SUSP
30.0000 mL | ORAL | Status: DC | PRN
  Administered 2019-02-12: 30 mL via ORAL
  Filled 2019-02-12: qty 30

## 2019-02-12 NOTE — Progress Notes (Signed)
PHARMACY NOTE:  ANTIMICROBIAL RENAL DOSAGE ADJUSTMENT  Current antimicrobial regimen includes a mismatch between antimicrobial dosage and estimated renal function.  As per policy approved by the Pharmacy & Therapeutics and Medical Executive Committees, the antimicrobial dosage will be adjusted accordingly.  Current antimicrobial dosage:  Cefepime 2 g IV q24h  Indication: Enterobacter cloacae UTI  Renal Function:  Estimated Creatinine Clearance: 37.4 mL/min (A) (by C-G formula based on SCr of 1.67 mg/dL (H)).    Antimicrobial dosage has been changed to:  Cefepime 2 g IV q12h  Thank you for allowing pharmacy to be a part of this patient's care.  Lenis Noon, [facility] 02/12/2019 8:37 AM

## 2019-02-12 NOTE — Progress Notes (Addendum)
Palliative care progress note  I met today with patient's wife and step-son outside of his room.  Saw patient briefly, but he is visiting with old friend.  Agreeable to meet tomorrow.  Discussed again with wife and step-son regarding options for discharge from the hospital and at this point, his wife is planning on wanting to bring him home with hospice services after stent placement and he is medically ready for discharge.  - Dilaudid as needed. - His wife is interested in hospice services at home, but there remains concern about safety when he transitions home.  His stepson has locked up or removed all of his firearms and knives in the home. - Plan for follow-up meeting on Monday @ 10AM per family request to discuss plan to transition home with hospice with patient.  I will call in AM to see if hospice can also attend per family request. - Wife requesting someone from surgery to look at his stoma prior to discharge (he had been pulling on it and it was bleeding earlier in admission).  Total time: 20 minutes Greater than 50%  of this time was spent counseling and coordinating care related to the above assessment and plan.  Micheline Rough, MD [facility] [phone]

## 2019-02-12 NOTE — Progress Notes (Signed)
PROGRESS NOTE    Charles Coffey  FTD:[id number] DOB: Oct 17, 1935 DOA: 02/08/2019 PCP: [name], MD    Brief Narrative:  84 y.o. male with medical history significant of CAD s/p CABG, GERD,metastatic bladder cancer status post suprapubic catheter, nephrostomy tubes, ostomy, dementia with  sundowning  Presented with   agitation and homoccidal ideations.  Patient recently started Bactrim for UTI.  Early in the week she was treated with Augmentin after was found to be febrile up to 102 he was seen by urology and urine sample was taken.  He continued to be febrile Monday Tuesday and Wednesday.  Finally has improved 1 week ago.  He has been having worsening confusion while febrile.  Decreased urinary output.  He actually stopped using his Lasix in the past few days. Patient has been having sundowning and was seen by oncologist who prescribed  Haldol 1 mg instructed to take 1-2 before bedtime.  Yesterday seemed to help with agitation. He has been getting gradually weaker has been having some shortness of breath Family  given  Him a small THC cookie he slept all day and became very agitated. Chasing them with knives. Trying to take out his ostomy bag not understanding why its there. Police was then called and patient was tased by the police.   Assessment & Plan:   Principal Problem:   Delirium Active Problems:   Hyperlipidemia LDL goal <70   HTN (hypertension)   Coronary artery disease of native heart with stable angina pectoris (HCC)   GERD   RLS (restless legs syndrome)   Chronic anemia   CKD (chronic kidney disease) stage 3, GFR 30-59 ml/min (HCC)   Hydronephrosis due to obstructive malignant neoplasm of bladder (HCC)   Elevated troponin   Urothelial carcinoma of bladder (HCC)   Dehydration   Thrombocytopathia (HCC)   Chronic systolic heart failure (HCC)   Lower urinary tract infectious disease   Hyponatremia   Agitation   AKI (acute kidney injury) ([place])   Hypokalemia  CAP (community acquired pneumonia)  Acute encephalopathy with  Agitation/ Delirium In the setting of chronic dementia   - most likely multifactorial secondary to combination of  Infection  mild dehydration secondary to decreased by mouth intake,  polypharmacy as well as administration of THC prior to admit  - Continue IVF hydration as tolerated  - Continued on empiric azithromycin and cefepime  - neurological exam noted to be non-focal at time of presentation    - no history of liver disease and ammonia noted to be unremarkable - Appreciate input by Psychiatry. Currently on increased zyprexa dose at 7.5mg  qhs with 2.5mg  PRN. If later ineffective, then recommendation for transition to Saphris 2.5mg  BID -mentation improved, still having day/nights reversed. Recommend keeping blinds open and to keep pt awake during the daytime  . HTN (hypertension) soft blood pressures noted on presentation -BP meds held at time of presentation -Stable at present  . CKD (chronic kidney disease) stage 3, GFR 30-59 ml/min (HCC)  -continue to avoid nephrotoxic agents -Recheck bmet in AM  . Hydronephrosis due to obstructive malignant neoplasm of bladder [facility]) - -urology consulted. Appreciate input. -Recommendation for transition from nephroureteral catheters to antegrade stents in preparation for transition to hospice -plan for stent placement tomorrow  . Urothelial carcinoma of bladder ([place]) -was in the process of getting hospice set up.   -Palliative Care continuing to follow  . Chronic systolic heart failure (HCC) avoid fluid overload given soft blood pressures -appears euvolemic presently -currently  stable  . Hyperlipidemia LDL goal <70  -Noted to be chronic, presently stable  . Coronary artery disease of native heart with stable angina pectoris ([place]) chronic currently not endorsing any chest pain resume home medications when able to tolerate -remains stable at this time  . GERD stable continue  home medications  . RLS (restless legs syndrome) stable continue home medications -stable at this time  . Elevated troponin chronic currently trending down no evidence of acute event on EKG.  Likely demand related of note patient did get tased    . Hyponatremia chronic  -Improved with hydration, normalized  . Lower urinary tract infectious disease  -Pt noted to be febrile at time of presentation -Initially continued on rocephin empirically.  -Recently discussed with Dr. [name] regarding outpatient cutlure results, appreciate input. Pt noted to have Enterobacter cloacae species. Continued on cefepime based on culture results   CAP small infiltrate noted on chest x-ray, reviewed -Continue on azithromycin and cefepime as per above  . Thrombocytopathia ([place]) chronic currently at baseline -Stable at present  . Chronic anemia  -Hemodynamically stable -Pt noted to have refused blood tx at time of presentation  . Hypokalemia normalized. Repeat bmet in AM  Colostomy site with bulging colostomy.  Patient have been known to remove his colostomy bag and pulled on the ostomy wife is concerned that he may cause damage.  Admitting physician discussed with general surgery who at this point recommends applying abdominal binder not tightly but for the purposes of concealing the site so that the patient will be less likely to pull on it.  -complaining of abd discomfort today. Will resume pepcid -Plan to discuss with General Surgery in AM  DVT prophylaxis: SCD's Code Status: DNR Family Communication: Pt in room, family at bedside Disposition Plan: Uncertain at this time  Consultants:   Urology  Palliative Care  Procedures:     Antimicrobials: Anti-infectives (From admission, onward)   Start     Dose/Rate Route Frequency Ordered Stop   02/12/19 1100  ceFEPIme (MAXIPIME) 2 g in sodium chloride 0.9 % 100 mL IVPB     2 g 200 mL/hr over 30 Minutes Intravenous Every 12 hours 02/12/19 0834      02/11/19 2200  azithromycin (ZITHROMAX) tablet 500 mg     500 mg Oral Daily at bedtime 02/11/19 0956     02/11/19 1200  fluconazole (DIFLUCAN) tablet 150 mg     150 mg Oral  Once 02/11/19 0959 02/11/19 1230   02/10/19 1700  azithromycin (ZITHROMAX) 500 mg in sodium chloride 0.9 % 250 mL IVPB  Status:  Discontinued     500 mg 250 mL/hr over 60 Minutes Intravenous Every 24 hours 02/10/19 1539 02/11/19 0956   02/10/19 1630  ceFEPIme (MAXIPIME) 2 g in sodium chloride 0.9 % 100 mL IVPB  Status:  Discontinued     2 g 200 mL/hr over 30 Minutes Intravenous Every 24 hours 02/10/19 1540 02/12/19 0834   02/09/19 1800  cefTRIAXone (ROCEPHIN) 1 g in sodium chloride 0.9 % 100 mL IVPB  Status:  Discontinued     1 g 200 mL/hr over 30 Minutes Intravenous Every 24 hours 02/08/19 2133 02/10/19 1537   02/08/19 1745  fluconazole (DIFLUCAN) tablet 150 mg  Status:  Discontinued     150 mg Oral  Once 02/08/19 1731 02/12/19 0841   02/08/19 1715  cefTRIAXone (ROCEPHIN) 1 g in sodium chloride 0.9 % 100 mL IVPB     1 g 200 mL/hr over 30  Minutes Intravenous  Once 02/08/19 1702 02/08/19 1855   02/08/19 1715  azithromycin (ZITHROMAX) 500 mg in sodium chloride 0.9 % 250 mL IVPB     500 mg 250 mL/hr over 60 Minutes Intravenous  Once 02/08/19 1702 02/08/19 2058      Subjective: Pleasant, joking in nad  Objective: Vitals:   02/11/19 1455 02/11/19 2206 02/12/19 0545 02/12/19 1203  BP: 117/79 124/86 105/71 120/72  Pulse: 87 96 88 95  Resp: 15 20 20 17   Temp: 98.7 F (37.1 C) 97.6 F (36.4 C) 97.9 F (36.6 C) (!) 97.5 F (36.4 C)  TempSrc: Oral Oral Oral Axillary  SpO2: 99% 98% 95% 99%  Weight:      Height:        Intake/Output Summary (Last 24 hours) at 02/12/2019 1416 Last data filed at 02/12/2019 1404 Gross per 24 hour  Intake 894.99 ml  Output 1175 ml  Net -280.01 ml   Filed Weights   02/08/19 1444  Weight: 87.8 kg    Examination: General exam: Conversant, in no acute distress Respiratory  system: normal chest rise, clear, no audible wheezing Cardiovascular system: regular rhythm, s1-s2 Gastrointestinal system: Nondistended, nontender, pos BS Central nervous system: No seizures, no tremors Extremities: No cyanosis, no joint deformities Skin: No rashes, no pallor Psychiatry: Affect normal // no auditory hallucinations   Data Reviewed: I have personally reviewed following labs and imaging studies  CBC: Recent Labs  Lab 02/08/19 1610 02/09/19 0327 02/12/19 0337  WBC 5.9 5.3 6.4  NEUTROABS 4.6  --   --   HGB 7.1* 7.0* 7.5*  HCT 22.2* 21.3* 24.2*  MCV 99.6 100.9* 107.6*  PLT 50* 43* 35*   Basic Metabolic Panel: Recent Labs  Lab 02/08/19 1610 02/08/19 2239 02/09/19 0327 02/11/19 0347 02/12/19 0337  NA 129*  --  133* 136 137  K 3.2*  --  3.3* 4.0 4.1  CL 97*  --  100 106 106  CO2 22  --  21* 23 22  GLUCOSE 174*  --  92 98 110*  BUN 74*  --  71* 40* 33*  CREATININE 2.61*  --  2.49* 1.88* 1.67*  CALCIUM 8.9  --  8.8* 9.1 9.1  MG  --  3.0* 2.9*  --   --   PHOS  --  4.5 4.6  --   --    GFR: Estimated Creatinine Clearance: 37.4 mL/min (A) (by C-G formula based on SCr of 1.67 mg/dL (H)). Liver Function Tests: Recent Labs  Lab 02/08/19 1610 02/09/19 0327  AST 314* 251*  ALT 623* 548*  ALKPHOS 78 67  BILITOT 1.3* 1.0  PROT 6.5 6.0*  ALBUMIN 3.2* 3.1*   No results for input(s): LIPASE, AMYLASE in the last 168 hours. Recent Labs  Lab 02/08/19 2239  AMMONIA 37*   Coagulation Profile: No results for input(s): INR, PROTIME in the last 168 hours. Cardiac Enzymes: Recent Labs  Lab 02/08/19 2239 02/09/19 0327  CKTOTAL 395  --   TROPONINI 0.06* 0.08*   BNP (last 3 results) No results for input(s): PROBNP in the last 8760 hours. HbA1C: No results for input(s): HGBA1C in the last 72 hours. CBG: No results for input(s): GLUCAP in the last 168 hours. Lipid Profile: No results for input(s): CHOL, HDL, LDLCALC, TRIG, CHOLHDL, LDLDIRECT in the last 72  hours. Thyroid Function Tests: No results for input(s): TSH, T4TOTAL, FREET4, T3FREE, THYROIDAB in the last 72 hours. Anemia Panel: No results for input(s): VITAMINB12, FOLATE, FERRITIN, TIBC, IRON,  RETICCTPCT in the last 72 hours. Sepsis Labs: Recent Labs  Lab 02/08/19 2239  LATICACIDVEN 1.5    Recent Results (from the past 240 hour(s))  Urine culture     Status: Abnormal   Collection Time: 02/08/19  3:57 PM  Result Value Ref Range Status   Specimen Description   Final    URINE, RANDOM Performed at [facility] [address], [place], [place] [address]    Special Requests   Final    NONE Performed at [facility], [place] [address], [place], [place] [address]    Culture 50,000 COLONIES/mL YEAST (A)  Final   Report Status 02/10/2019 FINAL  Final  Blood culture (routine x 2)     Status: None (Preliminary result)   Collection Time: 02/08/19 10:39 PM  Result Value Ref Range Status   Specimen Description   Final    BLOOD RIGHT ARM Performed at [facility] [address], [place], [place] [address]    Special Requests   Final    BOTTLES DRAWN AEROBIC ONLY Blood Culture adequate volume Performed at [facility] [address] [place], [place] [address]    Culture   Final    NO GROWTH 3 DAYS Performed at [facility], [place] [address] [place], [place] [address]    Report Status PENDING  Incomplete  Blood culture (routine x 2)     Status: None (Preliminary result)   Collection Time: 02/08/19 10:39 PM  Result Value Ref Range Status   Specimen Description   Final    BLOOD LEFT HAND Performed at [facility] [address] [place], [place] [address]    Special Requests   Final    BOTTLES DRAWN AEROBIC AND ANAEROBIC Blood Culture adequate volume Performed at [facility] [address], [place], [place] [address]    Culture   Final    NO GROWTH 3  DAYS Performed at [facility], [place] [address] [place], [place] [address]    Report Status PENDING  Incomplete  MRSA PCR Screening     Status: Abnormal   Collection Time: 02/09/19  4:00 PM  Result Value Ref Range Status   MRSA by PCR POSITIVE (A) NEGATIVE Final    Comment:        The GeneXpert MRSA Assay (FDA approved for NASAL specimens only), is one component of a comprehensive MRSA colonization surveillance program. It is not intended to diagnose MRSA infection nor to guide or monitor treatment for MRSA infections. RESULT CALLED TO, READ BACK BY AND VERIFIED WITH: K.DUNKELBERGER AT 1911 ON 02/09/19 BY N.THOMPSON Performed at [facility], [place] [address], [place], [place] [address]      Radiology Studies: No results found.  Scheduled Meds: . azithromycin  500 mg Oral QHS  . Chlorhexidine Gluconate Cloth  6 each Topical Q0600  . famotidine  20 mg Oral BID  . feeding supplement (ENSURE ENLIVE)  237 mL Oral BID BM  . mirabegron ER  25 mg Oral Daily  . multivitamin with minerals  1 tablet Oral Daily  . mupirocin ointment  1 application Nasal BID  . OLANZapine  7.5 mg Oral QAC supper  . PARoxetine  10 mg Oral Daily  . pramipexole  2.5 mg Oral BID   Continuous Infusions: . sodium chloride 250 mL (02/12/19 1154)  . ceFEPime (MAXIPIME) IV 2 g (02/12/19 1155)     LOS: 3 days  Marylu Lund, MD [facility] Pager On Amion  If 7PM-7AM, please contact night-coverage 02/12/2019, 2:16 PM

## 2019-02-12 NOTE — Progress Notes (Signed)
Colostomy bag changed.  Stoma and surrounding skin cleansed and WNL.  Previous bag half full with brown mushy stool.

## 2019-02-12 NOTE — Progress Notes (Signed)
Chief Complaint: Patient was seen in consultation today for chronic ureteral obstruction at the request of Dr. Irine Seal  Referring Physician(s): *Dr. [name]  Supervising Physician: Marybelle Killings  Patient Status: [facility] - In-pt  History of Present Illness: Charles Coffey is a 84 y.o. male well known to this IR service with hx of chronic ureteral obstruction secondary to bladder cancer. Pt with hx of SP catheter and bilateral nephroureteral tubes. He has had these routinely exchanged several times and currently the drains are capped, which he tolerates well. He was recently admitted with fever and altered mental status, which has steadily improved since admission. He has now decided to pursue Hospice care and therefore IR is asked to convert his PCNs to internal ureteral stents only as well as exchnage his SP catheter prior to discharge. Again he has improved clinically and the medical team feels he should be stable for procedure by tomorrow. PMHx, meds, labs, imaging, allergies reviewed.. Family at bedside.    Past Medical History:  Diagnosis Date  . Aortic atherosclerosis ([place])   . BPH with urinary obstruction   . CAD (coronary artery disease)    a.  MI 1995, CABG x 3 2002 (patient says that he had LIMA and RIMA grafts). b. ETT-Cardiolite (10/15) with EF 48%, apical scar, no ischemia. c. Nuc 07/2017 abnormal -> cath was performed,  patent LIMA to LAD and SVG to OM 2. RIMA to RCA is atretic. The right coronary artery is occluded distally with extensive collaterals from the LAD, medical therapy.   . Cancer [facility])    bladder cancer  . Cervical spondylosis without myelopathy 2020-07-2216  . Chronic low back pain    sees Dr. Kary Kos   . GERD (gastroesophageal reflux disease)   . Hiatal hernia   . Hyperlipidemia   . Hypertension   . IBS (irritable bowel syndrome)   . Ischemic cardiomyopathy   . Myocardial infarction ([place])   . RBBB   . Restless legs syndrome   . Statin  intolerance   . Syncope    a. in 2015 - no apparent cause, was taking sleep medicine at the time. Cardiac workup unremarkable.  . Tubular adenoma of colon     Past Surgical History:  Procedure Laterality Date  . COLONOSCOPY  10/17/2014   per Dr. Hilarie Fredrickson, tubular adenomas, repeat in 3 yrs   . COLONOSCOPY WITH PROPOFOL N/A 12/01/2017   Procedure: COLONOSCOPY WITH PROPOFOL;  Surgeon: Milus Banister, MD;  Location: [facility];  Service: Endoscopy;  Laterality: N/A;  . CYSTOSCOPY W/ RETROGRADES Left 11/25/2017   Procedure: CYSTOSCOPY WITH RETROGRADE PYELOGRAM;  Surgeon: Irine Seal, MD;  Location: [facility];  Service: Urology;  Laterality: Left;  . HEART BYPASS    . IR CATHETER TUBE CHANGE  11/08/2018  . IR CATHETER TUBE CHANGE  12/26/2018  . IR CATHETER TUBE CHANGE  01/08/2019  . IR CATHETER TUBE CHANGE  01/13/2019  . IR CONVERT LEFT NEPHROSTOMY TO NEPHROURETERAL CATH  09/20/2018  . IR CONVERT RIGHT NEPHROSTOMY TO NEPHROURETERAL CATH  02/04/2018  . IR CONVERT RIGHT NEPHROSTOMY TO NEPHROURETERAL CATH  08/23/2018  . IR EXT NEPHROURETERAL CATH EXCHANGE  04/11/2018  . IR EXT NEPHROURETERAL CATH EXCHANGE  07/04/2018  . IR EXT NEPHROURETERAL CATH EXCHANGE  09/20/2018  . IR EXT NEPHROURETERAL CATH EXCHANGE  11/08/2018  . IR EXT NEPHROURETERAL CATH EXCHANGE  11/08/2018  . IR EXT NEPHROURETERAL CATH EXCHANGE  12/26/2018  . IR EXT NEPHROURETERAL CATH EXCHANGE  12/26/2018  .  IR EXT NEPHROURETERAL CATH EXCHANGE  01/17/2019  . IR FLUORO GUIDE CV LINE RIGHT  12/27/2017  . IR FLUORO GUIDED NEEDLE PLC ASPIRATION/INJECTION LOC  08/19/2018  . IR NEPHROSTOGRAM LEFT THRU EXISTING ACCESS  12/24/2017  . IR NEPHROSTOMY EXCHANGE LEFT  01/28/2018  . IR NEPHROSTOMY EXCHANGE LEFT  02/04/2018  . IR NEPHROSTOMY EXCHANGE LEFT  02/25/2018  . IR NEPHROSTOMY EXCHANGE LEFT  07/04/2018  . IR NEPHROSTOMY EXCHANGE LEFT  07/05/2018  . IR NEPHROSTOMY EXCHANGE LEFT  08/09/2018  . IR NEPHROSTOMY EXCHANGE LEFT  08/16/2018  . IR NEPHROSTOMY  EXCHANGE LEFT  08/23/2018  . IR NEPHROSTOMY EXCHANGE RIGHT  12/24/2017  . IR NEPHROSTOMY EXCHANGE RIGHT  01/28/2018  . IR NEPHROSTOMY EXCHANGE RIGHT  04/11/2018  . IR NEPHROSTOMY EXCHANGE RIGHT  07/05/2018  . IR NEPHROSTOMY EXCHANGE RIGHT  08/09/2018  . IR NEPHROSTOMY EXCHANGE RIGHT  08/19/2018  . IR NEPHROSTOMY PLACEMENT LEFT  11/28/2017  . IR NEPHROSTOMY PLACEMENT RIGHT  12/03/2017  . IR PATIENT EVAL TECH 0-60 MINS  03/24/2018  . IR PATIENT EVAL TECH 0-60 MINS  03/28/2018  . IR PATIENT EVAL TECH 0-60 MINS  04/25/2018  . IR PATIENT EVAL TECH 0-60 MINS  06/20/2018  . IR US GUIDE VASC ACCESS RIGHT  12/27/2017  . LAPAROSCOPY N/A 12/01/2017   Procedure: LAPAROSCOPIC DIVERTING OSTOMY;  Surgeon: Stark Klein, MD;  Location: [facility];  Service: General;  Laterality: N/A;  . LEFT HEART CATH AND CORS/GRAFTS ANGIOGRAPHY N/A 08/25/2017   Procedure: LEFT HEART CATH AND CORS/GRAFTS ANGIOGRAPHY;  Surgeon: Wellington Hampshire, MD;  Location: [facility];  Service: Cardiovascular;  Laterality: N/A;  . LUMBAR FUSION  2003   L3-L4  . PROSTATE SURGERY  06-27-12   per Dr. Roni Bread, had CTT  . TONSILLECTOMY    . TRANSURETHRAL RESECTION OF BLADDER TUMOR N/A 11/25/2017   Procedure: TRANSURETHRAL RESECTION OF BLADDER TUMOR (TURBT);  Surgeon: Irine Seal, MD;  Location: [facility];  Service: Urology;  Laterality: N/A;    Allergies: Statins and Cyproheptadine  Medications:  Current Facility-Administered Medications:  .  0.9 %  sodium chloride infusion, , Intravenous, PRN, Doutova, Anastassia, MD, Last Rate: 10 mL/hr at 02/12/19 1154, 250 mL at 02/12/19 1154 .  acetaminophen (TYLENOL) tablet 650 mg, 650 mg, Oral, Q6H PRN **OR** acetaminophen (TYLENOL) suppository 650 mg, 650 mg, Rectal, Q6H PRN, Doutova, Anastassia, MD .  alum & mag hydroxide-simeth (MAALOX/MYLANTA) 200-200-20 MG/5ML suspension 30 mL, 30 mL, Oral, Q4H PRN, Donne Hazel, MD, 30 mL at 02/12/19 0125 .  azithromycin (ZITHROMAX) tablet 500 mg, 500 mg, Oral, QHS,  BellRonaldo Miyamoto, RPH, 500 mg at 02/11/19 2140 .  ceFEPIme (MAXIPIME) 2 g in sodium chloride 0.9 % 100 mL IVPB, 2 g, Intravenous, Q12H, Lenis Noon, RPH, Last Rate: 200 mL/hr at 02/12/19 1155, 2 g at 02/12/19 1155 .  Chlorhexidine Gluconate Cloth 2 % PADS 6 each, 6 each, Topical, Q0600, Donne Hazel, MD, 6 each at 02/12/19 [phone] .  famotidine (PEPCID) tablet 20 mg, 20 mg, Oral, BID, Donne Hazel, MD, 20 mg at 02/12/19 1315 .  feeding supplement (ENSURE ENLIVE) (ENSURE ENLIVE) liquid 237 mL, 237 mL, Oral, BID BM, Donne Hazel, MD .  HYDROmorphone (DILAUDID) injection 0.5-1 mg, 0.5-1 mg, Intravenous, Q3H PRN, Domingo Cocking, Gene, MD .  hydrOXYzine (ATARAX/VISTARIL) tablet 10 mg, 10 mg, Oral, TID PRN, Donne Hazel, MD, 10 mg at 02/12/19 0143 .  mirabegron ER (MYRBETRIQ) tablet 25 mg, 25 mg, Oral, Daily, Doutova, Anastassia,  MD, 25 mg at 02/12/19 1021 .  multivitamin with minerals tablet 1 tablet, 1 tablet, Oral, Daily, Donne Hazel, MD, 1 tablet at 02/12/19 1022 .  mupirocin ointment (BACTROBAN) 2 % 1 application, 1 application, Nasal, BID, Donne Hazel, MD, 1 application at 17/51/02 [phone] .  OLANZapine (ZYPREXA) tablet 2.5 mg, 2.5 mg, Oral, Daily PRN, Faythe Dingwall, DO .  OLANZapine [facility]) tablet 7.5 mg, 7.5 mg, Oral, QAC supper, Faythe Dingwall, DO, 7.5 mg at 02/11/19 1619 .  ondansetron (ZOFRAN) tablet 4 mg, 4 mg, Oral, Q6H PRN **OR** ondansetron (ZOFRAN) injection 4 mg, 4 mg, Intravenous, Q6H PRN, Doutova, Anastassia, MD .  oxyCODONE-acetaminophen (PERCOCET/ROXICET) 5-325 MG per tablet 1-2 tablet, 1-2 tablet, Oral, Q4H PRN, 2 tablet at 02/11/19 1802 **AND** oxyCODONE (Oxy IR/ROXICODONE) immediate release tablet 5-10 mg, 5-10 mg, Oral, Q4H PRN, Doutova, Anastassia, MD, 10 mg at 02/11/19 0755 .  PARoxetine (PAXIL) tablet 10 mg, 10 mg, Oral, Daily, Doutova, Anastassia, MD, 10 mg at 02/12/19 1022 .  pramipexole (MIRAPEX) tablet 2.5 mg, 2.5 mg, Oral, BID, Donne Hazel, MD,  2.5 mg at 02/12/19 1021 .  sodium chloride flush (NS) 0.9 % injection 10-40 mL, 10-40 mL, Intracatheter, PRN, Roel Cluck, Anastassia, MD, 10 mL at 02/11/19 0347    Family History  Problem Relation Age of Onset  . Heart attack Mother   . Aneurysm Father        femoral artery  . Heart disease Brother   . Heart disease Maternal Uncle        x 2  . Colon cancer Neg Hx   . Esophageal cancer Neg Hx   . Pancreatic cancer Neg Hx   . Kidney disease Neg Hx   . Liver disease Neg Hx     Social History   Socioeconomic History  . Marital status: Married    Spouse name: Not on file  . Number of children: 5  . Years of education: Some coll  . Highest education level: Not on file  Occupational History  . Occupation: retired  Scientific laboratory technician  . Financial resource strain: Not on file  . Food insecurity:    Worry: Not on file    Inability: Not on file  . Transportation needs:    Medical: Not on file    Non-medical: Not on file  Tobacco Use  . Smoking status: Former Smoker    Types: Cigarettes    Last attempt to quit: 12/28/1978    Years since quitting: 40.1  . Smokeless tobacco: Never Used  Substance and Sexual Activity  . Alcohol use: Not Currently    Alcohol/week: 2.0 standard drinks    Types: 1 Glasses of wine, 1 Cans of beer per week  . Drug use: No  . Sexual activity: Not Currently  Lifestyle  . Physical activity:    Days per week: Not on file    Minutes per session: Not on file  . Stress: Not on file  Relationships  . Social connections:    Talks on phone: Not on file    Gets together: Not on file    Attends religious service: Not on file    Active member of club or organization: Not on file    Attends meetings of clubs or organizations: Not on file    Relationship status: Not on file  Other Topics Concern  . Not on file  Social History Narrative   Lives at home w/ his wife   Right-handed   5 cups  of coffee per day     Review of Systems: A 12 point ROS discussed and  pertinent positives are indicated in the HPI above.  All other systems are negative.  Review of Systems  Vital Signs: BP 120/72 (BP Location: Left Arm)   Pulse 95   Temp (!) 97.5 F (36.4 C) (Axillary)   Resp 17   Ht 5\' 8"  (1.727 m)   Wt 87.8 kg   SpO2 99%   BMI 29.43 kg/m   Physical Exam Constitutional:      General: He is not in acute distress.    Appearance: He is not ill-appearing.  Cardiovascular:     Rate and Rhythm: Normal rate and regular rhythm.     Heart sounds: Normal heart sounds.  Pulmonary:     Effort: Pulmonary effort is normal. No respiratory distress.     Breath sounds: Normal breath sounds.  Abdominal:     General: Abdomen is flat.     Comments: (N)PCNs intact, sites clean.  Neurological:     General: No focal deficit present.     Mental Status: He is alert and oriented to person, place, and time.  Psychiatric:        Mood and Affect: Mood normal.        Judgment: Judgment normal.      Imaging: Dg Chest 2 View  Result Date: 02/08/2019 CLINICAL DATA:  Altered mentation and confusion. EXAM: CHEST - 2 VIEW COMPARISON:  11/25/2018 FINDINGS: Stable cardiomegaly with aortic atherosclerosis and post CABG change. Low lung volumes with interstitial edema is noted. Slightly more confluent right perihilar airspace disease is identified. Pneumonia is not excluded versus atelectasis. No significant effusion or pneumothorax. Port catheter tip terminates in the distal SVC. IMPRESSION: 1. Cardiomegaly with aortic atherosclerosis and post CABG change. 2. Low lung volumes with interstitial edema. 3. Slightly more confluent airspace opacity in the right perihilar region. Pneumonia is not excluded. Electronically Signed   By: Ashley Royalty M.D.   On: 02/08/2019 16:44   Ct Head Wo Contrast  Result Date: 02/08/2019 CLINICAL DATA:  Altered level of consciousness, sundowning, history coronary artery disease post CABG and MI, ischemic cardiomyopathy, hypertension, bladder cancer  EXAM: CT HEAD WITHOUT CONTRAST TECHNIQUE: Contiguous axial images were obtained from the base of the skull through the vertex without intravenous contrast. Sagittal and coronal MPR images reconstructed from axial data set. COMPARISON:  None FINDINGS: Brain: Generalized atrophy. Normal ventricular morphology. No midline shift or mass effect. Small vessel chronic ischemic changes of deep cerebral white matter. No intracranial hemorrhage, mass lesion, evidence of acute infarction, or extra-axial fluid collection. Vascular: Atherosclerotic calcifications of internal carotid arteries at skull base Skull: Intact Sinuses/Orbits: Clear Other: N/A IMPRESSION: Atrophy with small vessel chronic ischemic changes of deep cerebral white matter. No acute intracranial abnormalities. Electronically Signed   By: Lavonia Dana M.D.   On: 02/08/2019 17:06   Ct Renal Stone Study  Result Date: 02/08/2019 CLINICAL DATA:  Hematuria, suspected renal parenchymal cause, UTI treated with Bactrim this week, history coronary artery disease post MI and CABG, bladder cancer, hypertension, ischemic cardiomyopathy, GERD, CHF, former smoker EXAM: CT ABDOMEN AND PELVIS WITHOUT CONTRAST TECHNIQUE: Multidetector CT imaging of the abdomen and pelvis was performed following the standard protocol without IV contrast. Sagittal and coronal MPR images reconstructed from axial data set. Oral contrast was not administered. COMPARISON:  11/06/2018 FINDINGS: Lower chest: BILATERAL pleural effusions, small LEFT and moderate RIGHT. Peribronchial thickening. Minimal atelectasis RIGHT lung base. Hepatobiliary: Gallbladder  and liver normal appearance Pancreas: Normal appearance Spleen: Normal appearance Adrenals/Urinary Tract: Adrenal glands normal appearance. Atrophic LEFT kidney. BILATERAL nephrostomy tubes. No definite renal mass. Mild LEFT renal collecting system dilatation. BILATERAL ureteral stents. Mildly thickened bladder wall question due to tumor, cystitis  or chronic outlet obstruction suprapubic catheter present. Stomach/Bowel: Transverse double-barrel colostomy. Stool in RIGHT colon. Decompressed distal colon. Stomach and small bowel loops unremarkable. Vascular/Lymphatic: Atherosclerotic calcifications aorta, iliac arteries, coronary arteries. Aorta normal caliber. No definite abdominal or pelvic adenopathy. Reproductive: Minimal prostatic enlargement. Other: LEFT inguinal hernia containing fat. Scattered subcutaneous and intra-abdominal tissue edema. No free air or free fluid. Minimal presacral stranding. Musculoskeletal: Bones demineralized with prior L3-L4 posterior fusion scattered degenerative disc disease changes. IMPRESSION: BILATERAL nephrostomy tubes and ureteral stents with mild persistent dilatation of the LEFT renal collecting system. Bladder wall thickening which could reflect tumor, L obstruction or cystitis. Small LEFT inguinal hernia containing fat. BILATERAL pleural effusions RIGHT greater than LEFT. Electronically Signed   By: Lavonia Dana M.D.   On: 02/08/2019 17:13   Ir Ext Nephroureteral Cath Exchange  Result Date: 01/17/2019 INDICATION: Left antegrade nephroureteral catheter was accidentally severed externally during dressing change and requires exchange. EXAM: IR EXT NEPHROURETERAL CATH EXCHANGE COMPARISON:  None. MEDICATIONS: None ANESTHESIA/SEDATION: None CONTRAST:  5 mL Isovue-300-administered into the collecting system(s) FLUOROSCOPY TIME:  Fluoroscopy Time: 54 seconds.  8.1 mGy. COMPLICATIONS: None immediate. PROCEDURE: Informed written consent was obtained from the patient after a thorough discussion of the procedural risks, benefits and alternatives. All questions were addressed. Maximal Sterile Barrier Technique was utilized including caps, mask, sterile gowns, sterile gloves, sterile drape, hand hygiene and skin antiseptic. A timeout was performed prior to the initiation of the procedure. The pre-existing left-sided 10 French, 24  cm double-J length nephroureteral catheter was removed over a guidewire. A new catheter was then advanced. After forming the distal portion in the bladder and the proximal J portion in the renal pelvis, the external catheter was injected with contrast material to evaluate internal drainage. The catheter was then flushed and capped. FINDINGS: The new catheter is well positioned with J portions extending from the renal pelvis into the bladder. Injected contrast flows well into the bladder lumen. The external catheter was capped. IMPRESSION: Exchange and replacement of damaged left-sided nephroureteral catheter with a new 10 French catheter. Internal J segments extend from the renal pelvis into the bladder with good internal drainage present. The external catheter was capped. Electronically Signed   By: Aletta Edouard M.D.   On: 01/17/2019 11:22    Labs:  CBC: Recent Labs    02/03/19 1037 02/08/19 1610 02/09/19 0327 02/12/19 0337  WBC 6.5 5.9 5.3 6.4  HGB 7.3* 7.1* 7.0* 7.5*  HCT 22.3* 22.2* 21.3* 24.2*  PLT 50* 50* 43* 35*    COAGS: Recent Labs    08/19/18 0813 08/22/18 1730 09/20/18 1211 11/25/18 1604 12/26/18 1217  INR 1.23 1.15 1.05 1.18 1.18  APTT 36 37* 34  --   --     BMP: Recent Labs    02/08/19 1610 02/09/19 0327 02/11/19 0347 02/12/19 0337  NA 129* 133* 136 137  K 3.2* 3.3* 4.0 4.1  CL 97* 100 106 106  CO2 22 21* 23 22  GLUCOSE 174* 92 98 110*  BUN 74* 71* 40* 33*  CALCIUM 8.9 8.8* 9.1 9.1  CREATININE 2.61* 2.49* 1.88* 1.67*  GFRNONAA 22* 23* 33* 38*  GFRAA 26* 27* 38* 44*    LIVER FUNCTION TESTS: Recent Labs  01/20/19 1252 02/03/19 1037 02/08/19 1610 02/09/19 0327  BILITOT 0.9 1.7* 1.3* 1.0  AST 21 37 314* 251*  ALT 15 31 623* 548*  ALKPHOS 75 66 78 67  PROT 7.4 6.9 6.5 6.0*  ALBUMIN 3.6 3.2* 3.2* 3.1*    TUMOR MARKERS: No results for input(s): AFPTM, CEA, CA199, CHROMGRNA in the last 8760 hours.  Assessment and Plan: Hx bladder cancer  with chronic ureteral obstruction s/p (B)nephroureteral tubes that function well. Plan for attempted conversion to bilateral internal ureteral stents and exchange of SP catheter. Pt and family understand procedure plans for tomorrow. Risks and benefits of ureteral stents were discussed with the patient including, but not limited to, infection, bleeding, significant bleeding causing loss or decrease in renal function or damage to adjacent structures.   All of the patient's questions were answered, patient is agreeable to proceed. Will be set up to be done with sedation at pt/family request.  Consent signed and in chart.     Thank you for this interesting consult.  I greatly enjoyed meeting FRIEND DORFMAN and look forward to participating in their care.  A copy of this report was sent to the requesting provider on this date.  Electronically Signed: Ascencion Dike, PA-C 02/12/2019, 1:58 PM   I spent a total of 20 minutes in face to face in clinical consultation, greater than 50% of which was counseling/coordinating care for ureteral stents

## 2019-02-13 ENCOUNTER — Encounter (HOSPITAL_COMMUNITY): Payer: Self-pay | Admitting: Interventional Radiology

## 2019-02-13 ENCOUNTER — Inpatient Hospital Stay (HOSPITAL_COMMUNITY): Payer: Medicare HMO

## 2019-02-13 HISTORY — PX: IR URETERAL STENT PLACEMENT EXISTING ACCESS RIGHT: IMG6074

## 2019-02-13 HISTORY — PX: IR URETERAL STENT PLACEMENT EXISTING ACCESS LEFT: IMG6073

## 2019-02-13 LAB — PROTIME-INR
INR: 1.31
Prothrombin Time: 16.2 seconds — ABNORMAL HIGH (ref 11.4–15.2)

## 2019-02-13 LAB — CBC
HCT: 23.7 % — ABNORMAL LOW (ref 39.0–52.0)
Hemoglobin: 7.2 g/dL — ABNORMAL LOW (ref 13.0–17.0)
MCH: 32.7 pg (ref 26.0–34.0)
MCHC: 30.4 g/dL (ref 30.0–36.0)
MCV: 107.7 fL — ABNORMAL HIGH (ref 80.0–100.0)
Platelets: 33 10*3/uL — ABNORMAL LOW (ref 150–400)
RBC: 2.2 MIL/uL — ABNORMAL LOW (ref 4.22–5.81)
RDW: 23.1 % — ABNORMAL HIGH (ref 11.5–15.5)
WBC: 6.1 10*3/uL (ref 4.0–10.5)
nRBC: 0 % (ref 0.0–0.2)

## 2019-02-13 LAB — BASIC METABOLIC PANEL
Anion gap: 5 (ref 5–15)
BUN: 31 mg/dL — ABNORMAL HIGH (ref 8–23)
CO2: 23 mmol/L (ref 22–32)
Calcium: 9.1 mg/dL (ref 8.9–10.3)
Chloride: 109 mmol/L (ref 98–111)
Creatinine, Ser: 1.65 mg/dL — ABNORMAL HIGH (ref 0.61–1.24)
GFR calc Af Amer: 44 mL/min — ABNORMAL LOW (ref >60)
GFR calc non Af Amer: 38 mL/min — ABNORMAL LOW (ref >60)
Glucose, Bld: 107 mg/dL — ABNORMAL HIGH (ref 70–99)
Potassium: 4.6 mmol/L (ref 3.5–5.1)
SODIUM: 137 mmol/L (ref 135–145)

## 2019-02-13 LAB — HEPATIC FUNCTION PANEL
ALK PHOS: 67 U/L (ref 38–126)
ALT: 185 U/L — ABNORMAL HIGH (ref 0–44)
AST: 46 U/L — ABNORMAL HIGH (ref 15–41)
Albumin: 2.9 g/dL — ABNORMAL LOW (ref 3.5–5.0)
Bilirubin, Direct: 0.5 mg/dL — ABNORMAL HIGH (ref 0.0–0.2)
Indirect Bilirubin: 1 mg/dL — ABNORMAL HIGH (ref 0.3–0.9)
TOTAL PROTEIN: 6.5 g/dL (ref 6.5–8.1)
Total Bilirubin: 1.5 mg/dL — ABNORMAL HIGH (ref 0.3–1.2)

## 2019-02-13 LAB — AMMONIA: Ammonia: 13 umol/L (ref 9–35)

## 2019-02-13 MED ORDER — MIDAZOLAM HCL 2 MG/2ML IJ SOLN
INTRAMUSCULAR | Status: AC
  Filled 2019-02-13: qty 4

## 2019-02-13 MED ORDER — LIDOCAINE HCL 1 % IJ SOLN
INTRAMUSCULAR | Status: AC
  Filled 2019-02-13: qty 20

## 2019-02-13 MED ORDER — IOPAMIDOL (ISOVUE-300) INJECTION 61%
50.0000 mL | Freq: Once | INTRAVENOUS | Status: AC | PRN
  Administered 2019-02-13: 20 mL

## 2019-02-13 MED ORDER — FENTANYL CITRATE (PF) 100 MCG/2ML IJ SOLN
INTRAMUSCULAR | Status: AC
  Filled 2019-02-13: qty 2

## 2019-02-13 MED ORDER — FENTANYL CITRATE (PF) 100 MCG/2ML IJ SOLN
INTRAMUSCULAR | Status: AC | PRN
  Administered 2019-02-13: 50 ug via INTRAVENOUS

## 2019-02-13 MED ORDER — SODIUM CHLORIDE 0.9% FLUSH
5.0000 mL | Freq: Three times a day (TID) | INTRAVENOUS | Status: DC
  Administered 2019-02-13 – 2019-02-15 (×3): 5 mL

## 2019-02-13 MED ORDER — IOPAMIDOL (ISOVUE-300) INJECTION 61%
INTRAVENOUS | Status: AC
  Administered 2019-02-13: 20 mL
  Filled 2019-02-13: qty 50

## 2019-02-13 MED ORDER — HEPARIN SOD (PORK) LOCK FLUSH 100 UNIT/ML IV SOLN
INTRAVENOUS | Status: AC
  Filled 2019-02-13: qty 5

## 2019-02-13 MED ORDER — ASENAPINE MALEATE 2.5 MG SL SUBL
5.0000 mg | SUBLINGUAL_TABLET | Freq: Two times a day (BID) | SUBLINGUAL | Status: DC
  Administered 2019-02-13 – 2019-02-14 (×3): 5 mg via SUBLINGUAL
  Filled 2019-02-13 (×2): qty 2
  Filled 2019-02-13: qty 1

## 2019-02-13 MED ORDER — MIDAZOLAM HCL 2 MG/2ML IJ SOLN
INTRAMUSCULAR | Status: AC | PRN
  Administered 2019-02-13 (×2): 1 mg via INTRAVENOUS

## 2019-02-13 NOTE — Progress Notes (Signed)
PROGRESS NOTE    Charles Coffey  SEG:[id number] DOB: [birth year] DOA: 02/08/2019 PCP: [name], MD    Brief Narrative:  84 y.o. male with medical history significant of CAD s/p CABG, GERD,metastatic bladder cancer status post suprapubic catheter, nephrostomy tubes, ostomy, dementia with  sundowning  Presented with   agitation and homoccidal ideations.  Patient recently started Bactrim for UTI.  Early in the week she was treated with Augmentin after was found to be febrile up to 102 he was seen by urology and urine sample was taken.  He continued to be febrile Monday Tuesday and Wednesday.  Finally has improved 1 week ago.  He has been having worsening confusion while febrile.  Decreased urinary output.  He actually stopped using his Lasix in the past few days. Patient has been having sundowning and was seen by oncologist who prescribed  Haldol 1 mg instructed to take 1-2 before bedtime.  Yesterday seemed to help with agitation. He has been getting gradually weaker has been having some shortness of breath Family  given  Him a small THC cookie he slept all day and became very agitated. Chasing them with knives. Trying to take out his ostomy bag not understanding why its there. Police was then called and patient was tased by the police.   Assessment & Plan:   Principal Problem:   Delirium Active Problems:   Hyperlipidemia LDL goal <70   HTN (hypertension)   Coronary artery disease of native heart with stable angina pectoris (HCC)   GERD   RLS (restless legs syndrome)   Chronic anemia   CKD (chronic kidney disease) stage 3, GFR 30-59 ml/min (HCC)   Hydronephrosis due to obstructive malignant neoplasm of bladder (HCC)   Elevated troponin   Urothelial carcinoma of bladder (HCC)   Dehydration   Thrombocytopathia (HCC)   Chronic systolic heart failure (HCC)   Lower urinary tract infectious disease   Hyponatremia   Agitation   AKI (acute kidney injury) ([place])   Hypokalemia  CAP (community acquired pneumonia)  Acute encephalopathy with  Agitation/ Delirium In the setting of chronic dementia   - most likely multifactorial secondary to combination of  Infection  mild dehydration secondary to decreased by mouth intake,  polypharmacy as well as administration of THC prior to admit  - Continue IVF hydration as tolerated  - Continued on empiric azithromycin and cefepime as tolerated  - neurological exam noted to be non-focal at time of presentation    - no history of liver disease and ammonia noted to be unremarkable - Appreciate input by Psychiatry. Recently on increased zyprexa dose at 7.5mg  qhs with 2.5mg  PRN.  - Overnight, pt noted to be confused and agitated. Will transition to Saphris 5mg  bid per Psychiatry recommendations -Ordered and reviewed LFT and ammonia, all improved -mentation improved, still having day/nights reversed. Recommend keeping blinds open and to keep pt awake during the daytime  . HTN (hypertension) soft blood pressures noted on presentation -BP meds held at time of presentation -Remains stable at present  . CKD (chronic kidney disease) stage 3, GFR 30-59 ml/min (HCC)  -continue to avoid nephrotoxic agents -Improving renal function. Repeat bmet in AM  . Hydronephrosis due to obstructive malignant neoplasm of bladder [facility]) - -urology consulted. Appreciate input. -Recommendation for transition from nephroureteral catheters to antegrade stents in preparation for transition to hospice -stent placement planned for 2/17  . Urothelial carcinoma of bladder ([place]) -was in the process of getting hospice set up.   -Palliative  Care continuing to follow  . Chronic systolic heart failure (HCC) avoid fluid overload given soft blood pressures -appears euvolemic presently -presently stable  . Hyperlipidemia LDL goal <70  -Noted to be chronic, presently stable  . Coronary artery disease of native heart with stable angina pectoris ([place]) chronic  currently not endorsing any chest pain resume home medications when able to tolerate -remains stable currently  . GERD stable continue home medications  . RLS (restless legs syndrome) stable continue home medications -stable at this time  . Elevated troponin chronic currently trending down no evidence of acute event on EKG.  Likely demand related of note patient did get tased    . Hyponatremia chronic  -Improved with hydration, improved  . Lower urinary tract infectious disease  -Pt noted to be febrile at time of presentation -Initially continued on rocephin empirically.  -Recently discussed with Dr. [name] regarding outpatient cutlure results, appreciate input. Pt noted to have Enterobacter cloacae species. Continued on cefepime based on culture results   CAP small infiltrate noted on chest x-ray, reviewed -Continue on azithromycin and cefepime as tolerated  . Thrombocytopathia ([place]) chronic currently at baseline -Presently stable  . Chronic anemia  -Hemodynamically stable -Pt noted to have refused blood tx at time of presentation  . Hypokalemia normalized. Repeat bmet in AM  Colostomy site with bulging colostomy.  Patient have been known to remove his colostomy bag and pulled on the ostomy wife is concerned that he may cause damage.  Admitting physician discussed with general surgery who at this point recommends applying abdominal binder not tightly but for the purposes of concealing the site so that the patient will be less likely to pull on it.  -Seen and evaluated by General Surgery 2/17  DVT prophylaxis: SCD's Code Status: DNR Family Communication: Pt in room, family at bedside Disposition Plan: Uncertain at this time  Consultants:   Urology  Palliative Care  General Surgery  Procedures:     Antimicrobials: Anti-infectives (From admission, onward)   Start     Dose/Rate Route Frequency Ordered Stop   02/12/19 1100  ceFEPIme (MAXIPIME) 2 g in sodium  chloride 0.9 % 100 mL IVPB     2 g 200 mL/hr over 30 Minutes Intravenous Every 12 hours 02/12/19 0834     02/11/19 2200  azithromycin (ZITHROMAX) tablet 500 mg     500 mg Oral Daily at bedtime 02/11/19 0956     02/11/19 1200  fluconazole (DIFLUCAN) tablet 150 mg     150 mg Oral  Once 02/11/19 0959 02/11/19 1230   02/10/19 1700  azithromycin (ZITHROMAX) 500 mg in sodium chloride 0.9 % 250 mL IVPB  Status:  Discontinued     500 mg 250 mL/hr over 60 Minutes Intravenous Every 24 hours 02/10/19 1539 02/11/19 0956   02/10/19 1630  ceFEPIme (MAXIPIME) 2 g in sodium chloride 0.9 % 100 mL IVPB  Status:  Discontinued     2 g 200 mL/hr over 30 Minutes Intravenous Every 24 hours 02/10/19 1540 02/12/19 0834   02/09/19 1800  cefTRIAXone (ROCEPHIN) 1 g in sodium chloride 0.9 % 100 mL IVPB  Status:  Discontinued     1 g 200 mL/hr over 30 Minutes Intravenous Every 24 hours 02/08/19 2133 02/10/19 1537   02/08/19 1745  fluconazole (DIFLUCAN) tablet 150 mg  Status:  Discontinued     150 mg Oral  Once 02/08/19 1731 02/12/19 0841   02/08/19 1715  cefTRIAXone (ROCEPHIN) 1 g in sodium chloride 0.9 % 100  mL IVPB     1 g 200 mL/hr over 30 Minutes Intravenous  Once 02/08/19 1702 02/08/19 1855   02/08/19 1715  azithromycin (ZITHROMAX) 500 mg in sodium chloride 0.9 % 250 mL IVPB     500 mg 250 mL/hr over 60 Minutes Intravenous  Once 02/08/19 1702 02/08/19 2058      Subjective: Agitated and anxious overnight  Objective: Vitals:   02/12/19 1203 02/12/19 2126 02/13/19 0541 02/13/19 1327  BP: 120/72 112/78 128/78 122/83  Pulse: 95 88 99 (!) 105  Resp: 17 18 (!) 22 (!) 22  Temp: (!) 97.5 F (36.4 C) 98.2 F (36.8 C) 97.8 F (36.6 C) 97.9 F (36.6 C)  TempSrc: Axillary Oral Oral Oral  SpO2: 99% 100% 92% 95%  Weight:      Height:        Intake/Output Summary (Last 24 hours) at 02/13/2019 1452 Last data filed at 02/13/2019 1324 Gross per 24 hour  Intake 494.52 ml  Output 1375 ml  Net -880.48 ml    Filed Weights   02/08/19 1444  Weight: 87.8 kg    Examination: General exam: Awake, sitting up in bed, in nad Respiratory system: Normal respiratory effort, no wheezing Cardiovascular system: regular rate, s1, s2 Gastrointestinal system: Soft, nondistended, positive BS Central nervous system: CN2-12 grossly intact, strength intact Extremities: Perfused, no clubbing Skin: Normal skin turgor, no notable skin lesions seen Psychiatry: Mood normal // no visual hallucinations   Data Reviewed: I have personally reviewed following labs and imaging studies  CBC: Recent Labs  Lab 02/08/19 1610 02/09/19 0327 02/12/19 0337 02/13/19 0339  WBC 5.9 5.3 6.4 6.1  NEUTROABS 4.6  --   --   --   HGB 7.1* 7.0* 7.5* 7.2*  HCT 22.2* 21.3* 24.2* 23.7*  MCV 99.6 100.9* 107.6* 107.7*  PLT 50* 43* 35* 33*   Basic Metabolic Panel: Recent Labs  Lab 02/08/19 1610 02/08/19 2239 02/09/19 0327 02/11/19 0347 02/12/19 0337 02/13/19 0339  NA 129*  --  133* 136 137 137  K 3.2*  --  3.3* 4.0 4.1 4.6  CL 97*  --  100 106 106 109  CO2 22  --  21* 23 22 23   GLUCOSE 174*  --  92 98 110* 107*  BUN 74*  --  71* 40* 33* 31*  CREATININE 2.61*  --  2.49* 1.88* 1.67* 1.65*  CALCIUM 8.9  --  8.8* 9.1 9.1 9.1  MG  --  3.0* 2.9*  --   --   --   PHOS  --  4.5 4.6  --   --   --    GFR: Estimated Creatinine Clearance: 37.8 mL/min (A) (by C-G formula based on SCr of 1.65 mg/dL (H)). Liver Function Tests: Recent Labs  Lab 02/08/19 1610 02/09/19 0327 02/13/19 1308  AST 314* 251* 46*  ALT 623* 548* 185*  ALKPHOS 78 67 67  BILITOT 1.3* 1.0 1.5*  PROT 6.5 6.0* 6.5  ALBUMIN 3.2* 3.1* 2.9*   No results for input(s): LIPASE, AMYLASE in the last 168 hours. Recent Labs  Lab 02/08/19 2239 02/13/19 1210  AMMONIA 37* 13   Coagulation Profile: Recent Labs  Lab 02/13/19 1308  INR 1.31   Cardiac Enzymes: Recent Labs  Lab 02/08/19 2239 02/09/19 0327  CKTOTAL 395  --   TROPONINI 0.06* 0.08*   BNP  (last 3 results) No results for input(s): PROBNP in the last 8760 hours. HbA1C: No results for input(s): HGBA1C in the last 72 hours.  CBG: No results for input(s): GLUCAP in the last 168 hours. Lipid Profile: No results for input(s): CHOL, HDL, LDLCALC, TRIG, CHOLHDL, LDLDIRECT in the last 72 hours. Thyroid Function Tests: No results for input(s): TSH, T4TOTAL, FREET4, T3FREE, THYROIDAB in the last 72 hours. Anemia Panel: No results for input(s): VITAMINB12, FOLATE, FERRITIN, TIBC, IRON, RETICCTPCT in the last 72 hours. Sepsis Labs: Recent Labs  Lab 02/08/19 2239  LATICACIDVEN 1.5    Recent Results (from the past 240 hour(s))  Urine culture     Status: Abnormal   Collection Time: 02/08/19  3:57 PM  Result Value Ref Range Status   Specimen Description   Final    URINE, RANDOM Performed at [facility] [address], [place], [place] [address]    Special Requests   Final    NONE Performed at [facility], [place] [address] [place], [place] [address]    Culture 50,000 COLONIES/mL YEAST (A)  Final   Report Status 02/10/2019 FINAL  Final  Blood culture (routine x 2)     Status: None (Preliminary result)   Collection Time: 02/08/19 10:39 PM  Result Value Ref Range Status   Specimen Description   Final    BLOOD RIGHT ARM Performed at [facility] [address], [place], [place] [address]    Special Requests   Final    BOTTLES DRAWN AEROBIC ONLY Blood Culture adequate volume Performed at [facility] [address], [place], [place] [address]    Culture   Final    NO GROWTH 4 DAYS Performed at [facility], [place] [address] [place], [place] [address]    Report Status PENDING  Incomplete  Blood culture (routine x 2)     Status: None (Preliminary result)   Collection Time: 02/08/19 10:39 PM  Result Value Ref Range Status   Specimen Description   Final    BLOOD LEFT HAND Performed at [facility] [address] [place], [place] [address]    Special Requests   Final    BOTTLES DRAWN AEROBIC AND ANAEROBIC Blood Culture adequate volume Performed at [facility] [address] [place], [place] [address]    Culture   Final    NO GROWTH 4 DAYS Performed at [facility], [place] [address] [place], [place] [address]    Report Status PENDING  Incomplete  MRSA PCR Screening     Status: Abnormal   Collection Time: 02/09/19  4:00 PM  Result Value Ref Range Status   MRSA by PCR POSITIVE (A) NEGATIVE Final    Comment:        The GeneXpert MRSA Assay (FDA approved for NASAL specimens only), is one component of a comprehensive MRSA colonization surveillance program. It is not intended to diagnose MRSA infection nor to guide or monitor treatment for MRSA infections. RESULT CALLED TO, READ BACK BY AND VERIFIED WITH: K.DUNKELBERGER AT 1911 ON 02/09/19 BY N.THOMPSON Performed at [facility], [place] [address], [place], [place] [address]      Radiology Studies: No results found.  Scheduled Meds: . Asenapine Maleate  5 mg Sublingual BID  . azithromycin  500 mg Oral QHS  . Chlorhexidine Gluconate Cloth  6 each Topical Q0600  . famotidine  20 mg Oral BID  . feeding supplement (ENSURE ENLIVE)  237 mL Oral BID BM  . mirabegron ER  25 mg Oral Daily  . multivitamin with minerals  1 tablet Oral Daily  . mupirocin ointment  1 application Nasal BID  . PARoxetine  10 mg Oral Daily  . pramipexole  2.5 mg Oral BID   Continuous Infusions: . sodium chloride Stopped (02/13/19 0050)  . ceFEPime (MAXIPIME) IV 2 g (02/13/19 0930)     LOS: 4 days   Marylu Lund, MD [facility] Pager On Amion  If 7PM-7AM, please contact night-coverage 02/13/2019, 2:52 PM

## 2019-02-13 NOTE — Care Management Important Message (Signed)
Important Message  Patient Details  Name: Charles Coffey MRN: [id number] Date of Birth: 1935-01-18   Medicare Important Message Given:  Yes    Kerin Salen 02/13/2019, 12:39 [place] Message  Patient Details  Name: Charles Coffey MRN: [id number] Date of Birth: 1935/06/21   Medicare Important Message Given:  Yes    Kerin Salen 02/13/2019, 12:39 PM

## 2019-02-13 NOTE — Progress Notes (Addendum)
Charles Coffey  04-06-1935 [id number]  Patient Care Team: [name], MD as PCP - General Nahser, Wonda Cheng, MD as PCP - Cardiology (Cardiology) Kary Kos, MD as Consulting Physician (Neurosurgery) Wyatt Portela, MD as Consulting Physician (Oncology) Irine Seal, MD as Attending Physician (Urology) Stark Klein, MD as Consulting Physician (General Surgery)  This patient is a 84 y.o.male seen by our group the past.  He underwent palliative transverse loop colostomy by Dr. Barry Dienes with our group in 2018 for rectal obstruction from his advanced Stage IV bladder cancer.  He was having some issues with bagging and possible prolapse and wished to have a second opinion for consideration of possible ostomy revision vs parastomal hernia repair.  I saw the patient and his daughter last fall.  I thought his diagnosis was not good, no major issues with his ostomy, and surgical risks were quite high from UTIs causing infection let alone physical condition to tolerate an operation.  I did not recommend any surgical intervention.  Apparently her daughter wished to reconsider this with an office visit today but pt declined & now is in the hospital.  I will try and see the patient later tonight but I agree with palliative care and hospice.  High surgical risk with no strong evidence of any obstruction or incarceration.  I do not think the patient needs another operation.  Abdominal binder may help protect against him ripping off the appliance.  WOCN Consult to help troubleshoot any issues    Patient Active Problem List   Diagnosis Date Noted  . CAP (community acquired pneumonia) 02/09/2019  . Palliative care encounter 02/09/2019  . Delirium 02/08/2019  . Hypokalemia 02/08/2019  . Acute exacerbation of CHF (congestive heart failure) ([place]) 11/25/2018  . Fatigue 11/25/2018  . Chronic pain 11/25/2018  . Depression 11/25/2018  . GERD (gastroesophageal reflux disease) 11/25/2018  . Status post colostomy  ([place]) 11/25/2018  . Moderate episode of recurrent major depressive disorder ([place])   . Bacterial infection due to Proteus mirabilis 11/09/2018  . Sepsis ([place]) 11/09/2018  . AKI (acute kidney injury) ([place]) 11/09/2018  . UTI (urinary tract infection) 11/06/2018  . Cancer associated pain   . Palliative care by specialist   . DNR (do not resuscitate)   . Agitation   . Sepsis due to urinary tract infection ([place]) 08/19/2018  . Hyponatremia 08/19/2018  . Ventral hernia without obstruction or gangrene 07/21/2018  . Displacement of nephrostomy tube ([place]) 07/05/2018  . Nephrostomy tube displaced ([place]) 07/05/2018  . Malfunction of nephrostomy tube ([place])   . Lower urinary tract infectious disease 06/30/2018  . Port-A-Cath in place 03/31/2018  . PVC (premature ventricular contraction) 02/25/2018  . Malignant neoplasm of urinary bladder ([place]) 02/25/2018  . Catheter-associated urinary tract infection ([place]) 02/17/2018  . Nephrostomy complication ([place]) 62/95/2841  . Chronic systolic heart failure ([place]) 02/07/2018  . Malnutrition of moderate degree 02/01/2018  . Acute respiratory failure with hypoxia ([place]) 01/31/2018  . Acute CHF (congestive heart failure) ([place]) 01/31/2018  . Thrombocytopathia ([place])   . DVT (deep venous thrombosis) ([place]) 01/11/2018  . Sepsis secondary to UTI ([place]) 01/01/2018  . Colostomy in place [facility]) 12/04/2017  . Rectal stenosis   . Low vitamin B12 level 11/30/2017  . Irritable bowel syndrome with constipation   . Pyelonephritis 11/28/2017  . Fever 11/28/2017  . Ileus ([place]) 11/28/2017  . Elevated troponin 11/28/2017  . Urothelial carcinoma of bladder ([place]) 11/28/2017  . Dehydration 11/28/2017  . Thrombocytopenia ([place]) 11/28/2017  .  Hydronephrosis due to obstructive malignant neoplasm of bladder ([place]) 11/25/2017  . Chronic anemia 09/20/2017  . CKD (chronic kidney disease) stage 3, GFR 30-59 ml/min (HCC) 09/20/2017  . Angina pectoris ([place])   . Abnormal stress test   .  Cervical spondylosis without myelopathy 05-21-2016  . PAD (peripheral artery disease) ([place]) 03/04/2015  . Coronary artery disease involving native heart without angina pectoris 11/01/2014  . Acid reflux 11/01/2014  . H/O male genital system disorder 11/01/2014  . RLS (restless legs syndrome) 11/01/2014  . Acne erythematosa 11/01/2014  . Syncope 09/24/2014  . Chronic low back pain 11/10/2013  . Ischemic cardiomyopathy 11/29/2012  . Hyperlipidemia LDL goal <70 10/15/2010  . RESTLESS LEGS SYNDROME 10/15/2010  . HTN (hypertension) 10/15/2010  . MYOCARDIAL INFARCTION, HX OF 10/15/2010  . Coronary artery disease of native heart with stable angina pectoris ([place]) 10/15/2010  . GERD 10/15/2010  . BENIGN PROSTATIC HYPERTROPHY 10/15/2010  . BENIGN PROSTATIC HYPERTROPHY, WITH OBSTRUCTION 10/15/2010  . COLONIC POLYPS, HX OF 10/15/2010    Past Medical History:  Diagnosis Date  . Aortic atherosclerosis ([place])   . BPH with urinary obstruction   . CAD (coronary artery disease)    a.  MI 1995, CABG x 3 2002 (patient says that he had LIMA and RIMA grafts). b. ETT-Cardiolite (10/15) with EF 48%, apical scar, no ischemia. c. Nuc 07/2017 abnormal -> cath was performed,  patent LIMA to LAD and SVG to OM 2. RIMA to RCA is atretic. The right coronary artery is occluded distally with extensive collaterals from the LAD, medical therapy.   . Cancer [facility])    bladder cancer  . Cervical spondylosis without myelopathy 02-17-202017  . Chronic low back pain    sees Dr. Kary Kos   . GERD (gastroesophageal reflux disease)   . Hiatal hernia   . Hyperlipidemia   . Hypertension   . IBS (irritable bowel syndrome)   . Ischemic cardiomyopathy   . Myocardial infarction ([place])   . RBBB   . Restless legs syndrome   . Statin intolerance   . Syncope    a. in 2015 - no apparent cause, was taking sleep medicine at the time. Cardiac workup unremarkable.  . Tubular adenoma of colon     Past Surgical History:  Procedure  Laterality Date  . COLONOSCOPY  10/17/2014   per Dr. Hilarie Fredrickson, tubular adenomas, repeat in 3 yrs   . COLONOSCOPY WITH PROPOFOL N/A 12/01/2017   Procedure: COLONOSCOPY WITH PROPOFOL;  Surgeon: Milus Banister, MD;  Location: [facility];  Service: Endoscopy;  Laterality: N/A;  . CYSTOSCOPY W/ RETROGRADES Left 11/25/2017   Procedure: CYSTOSCOPY WITH RETROGRADE PYELOGRAM;  Surgeon: Irine Seal, MD;  Location: [facility];  Service: Urology;  Laterality: Left;  . HEART BYPASS    . IR CATHETER TUBE CHANGE  11/08/2018  . IR CATHETER TUBE CHANGE  12/26/2018  . IR CATHETER TUBE CHANGE  01/08/2019  . IR CATHETER TUBE CHANGE  01/13/2019  . IR CONVERT LEFT NEPHROSTOMY TO NEPHROURETERAL CATH  09/20/2018  . IR CONVERT RIGHT NEPHROSTOMY TO NEPHROURETERAL CATH  02/04/2018  . IR CONVERT RIGHT NEPHROSTOMY TO NEPHROURETERAL CATH  08/23/2018  . IR EXT NEPHROURETERAL CATH EXCHANGE  04/11/2018  . IR EXT NEPHROURETERAL CATH EXCHANGE  07/04/2018  . IR EXT NEPHROURETERAL CATH EXCHANGE  09/20/2018  . IR EXT NEPHROURETERAL CATH EXCHANGE  11/08/2018  . IR EXT NEPHROURETERAL CATH EXCHANGE  11/08/2018  . IR EXT NEPHROURETERAL CATH EXCHANGE  12/26/2018  . IR EXT NEPHROURETERAL CATH  EXCHANGE  12/26/2018  . IR EXT NEPHROURETERAL CATH EXCHANGE  01/17/2019  . IR FLUORO GUIDE CV LINE RIGHT  12/27/2017  . IR FLUORO GUIDED NEEDLE PLC ASPIRATION/INJECTION LOC  08/19/2018  . IR NEPHROSTOGRAM LEFT THRU EXISTING ACCESS  12/24/2017  . IR NEPHROSTOMY EXCHANGE LEFT  01/28/2018  . IR NEPHROSTOMY EXCHANGE LEFT  02/04/2018  . IR NEPHROSTOMY EXCHANGE LEFT  02/25/2018  . IR NEPHROSTOMY EXCHANGE LEFT  07/04/2018  . IR NEPHROSTOMY EXCHANGE LEFT  07/05/2018  . IR NEPHROSTOMY EXCHANGE LEFT  08/09/2018  . IR NEPHROSTOMY EXCHANGE LEFT  08/16/2018  . IR NEPHROSTOMY EXCHANGE LEFT  08/23/2018  . IR NEPHROSTOMY EXCHANGE RIGHT  12/24/2017  . IR NEPHROSTOMY EXCHANGE RIGHT  01/28/2018  . IR NEPHROSTOMY EXCHANGE RIGHT  04/11/2018  . IR NEPHROSTOMY EXCHANGE RIGHT  07/05/2018   . IR NEPHROSTOMY EXCHANGE RIGHT  08/09/2018  . IR NEPHROSTOMY EXCHANGE RIGHT  08/19/2018  . IR NEPHROSTOMY PLACEMENT LEFT  11/28/2017  . IR NEPHROSTOMY PLACEMENT RIGHT  12/03/2017  . IR PATIENT EVAL TECH 0-60 MINS  03/24/2018  . IR PATIENT EVAL TECH 0-60 MINS  03/28/2018  . IR PATIENT EVAL TECH 0-60 MINS  04/25/2018  . IR PATIENT EVAL TECH 0-60 MINS  06/20/2018  . IR US GUIDE VASC ACCESS RIGHT  12/27/2017  . LAPAROSCOPY N/A 12/01/2017   Procedure: LAPAROSCOPIC DIVERTING OSTOMY;  Surgeon: Stark Klein, MD;  Location: [facility];  Service: General;  Laterality: N/A;  . LEFT HEART CATH AND CORS/GRAFTS ANGIOGRAPHY N/A 08/25/2017   Procedure: LEFT HEART CATH AND CORS/GRAFTS ANGIOGRAPHY;  Surgeon: Wellington Hampshire, MD;  Location: [facility];  Service: Cardiovascular;  Laterality: N/A;  . LUMBAR FUSION  2003   L3-L4  . PROSTATE SURGERY  06-27-12   per Dr. Roni Bread, had CTT  . TONSILLECTOMY    . TRANSURETHRAL RESECTION OF BLADDER TUMOR N/A 11/25/2017   Procedure: TRANSURETHRAL RESECTION OF BLADDER TUMOR (TURBT);  Surgeon: Irine Seal, MD;  Location: [facility];  Service: Urology;  Laterality: N/A;    Social History   Socioeconomic History  . Marital status: Married    Spouse name: Not on file  . Number of children: 5  . Years of education: Some coll  . Highest education level: Not on file  Occupational History  . Occupation: retired  Scientific laboratory technician  . Financial resource strain: Not on file  . Food insecurity:    Worry: Not on file    Inability: Not on file  . Transportation needs:    Medical: Not on file    Non-medical: Not on file  Tobacco Use  . Smoking status: Former Smoker    Types: Cigarettes    Last attempt to quit: 12/28/1978    Years since quitting: 40.1  . Smokeless tobacco: Never Used  Substance and Sexual Activity  . Alcohol use: Not Currently    Alcohol/week: 2.0 standard drinks    Types: 1 Glasses of wine, 1 Cans of beer per week  . Drug use: No  . Sexual activity: Not  Currently  Lifestyle  . Physical activity:    Days per week: Not on file    Minutes per session: Not on file  . Stress: Not on file  Relationships  . Social connections:    Talks on phone: Not on file    Gets together: Not on file    Attends religious service: Not on file    Active member of club or organization: Not on file    Attends meetings  of clubs or organizations: Not on file    Relationship status: Not on file  . Intimate partner violence:    Fear of current or ex partner: Not on file    Emotionally abused: Not on file    Physically abused: Not on file    Forced sexual activity: Not on file  Other Topics Concern  . Not on file  Social History Narrative   Lives at home w/ his wife   Right-handed   5 cups of coffee per day    Family History  Problem Relation Age of Onset  . Heart attack Mother   . Aneurysm Father        femoral artery  . Heart disease Brother   . Heart disease Maternal Uncle        x 2  . Colon cancer Neg Hx   . Esophageal cancer Neg Hx   . Pancreatic cancer Neg Hx   . Kidney disease Neg Hx   . Liver disease Neg Hx     Current Facility-Administered Medications  Medication Dose Route Frequency Provider Last Rate Last Dose  . 0.9 %  sodium chloride infusion   Intravenous PRN Toy Baker, MD   Stopped at 02/13/19 0050  . acetaminophen (TYLENOL) tablet 650 mg  650 mg Oral Q6H PRN Toy Baker, MD       Or  . acetaminophen (TYLENOL) suppository 650 mg  650 mg Rectal Q6H PRN Doutova, Anastassia, MD      . alum & mag hydroxide-simeth (MAALOX/MYLANTA) 200-200-20 MG/5ML suspension 30 mL  30 mL Oral Q4H PRN Donne Hazel, MD   30 mL at 02/12/19 0125  . Asenapine Maleate (SAPHRIS) sublingual tablet 5 mg  5 mg Sublingual BID Donne Hazel, MD      . azithromycin [facility]) tablet 500 mg  500 mg Oral QHS Eudelia Bunch, RPH   500 mg at 02/12/19 2226  . ceFEPIme (MAXIPIME) 2 g in sodium chloride 0.9 % 100 mL IVPB  2 g Intravenous Q12H  Lenis Noon, RPH 200 mL/hr at 02/13/19 0930 2 g at 02/13/19 0930  . Chlorhexidine Gluconate Cloth 2 % PADS 6 each  6 each Topical Q0600 Donne Hazel, MD   6 each at 02/13/19 0615  . famotidine (PEPCID) tablet 20 mg  20 mg Oral BID Donne Hazel, MD   20 mg at 02/13/19 4098  . feeding supplement (ENSURE ENLIVE) (ENSURE ENLIVE) liquid 237 mL  237 mL Oral BID BM Donne Hazel, MD      . HYDROmorphone (DILAUDID) injection 0.5-1 mg  0.5-1 mg Intravenous Q3H PRN Micheline Rough, MD      . hydrOXYzine (ATARAX/VISTARIL) tablet 10 mg  10 mg Oral TID PRN Donne Hazel, MD   10 mg at 02/12/19 0143  . mirabegron ER (MYRBETRIQ) tablet 25 mg  25 mg Oral Daily Doutova, Anastassia, MD   25 mg at 02/13/19 0924  . multivitamin with minerals tablet 1 tablet  1 tablet Oral Daily Donne Hazel, MD   1 tablet at 02/13/19 1191  . mupirocin ointment (BACTROBAN) 2 % 1 application  1 application Nasal BID Donne Hazel, MD   1 application at 47/82/95 [id number]  . ondansetron (ZOFRAN) tablet 4 mg  4 mg Oral Q6H PRN Doutova, Anastassia, MD       Or  . ondansetron (ZOFRAN) injection 4 mg  4 mg Intravenous Q6H PRN Toy Baker, MD      . oxyCODONE-acetaminophen (PERCOCET/ROXICET)  5-325 MG per tablet 1-2 tablet  1-2 tablet Oral Q4H PRN Toy Baker, MD   2 tablet at 02/12/19 1649   And  . oxyCODONE (Oxy IR/ROXICODONE) immediate release tablet 5-10 mg  5-10 mg Oral Q4H PRN Toy Baker, MD   10 mg at 02/11/19 0755  . PARoxetine (PAXIL) tablet 10 mg  10 mg Oral Daily Doutova, Anastassia, MD   10 mg at 02/13/19 0923  . pramipexole (MIRAPEX) tablet 2.5 mg  2.5 mg Oral BID Donne Hazel, MD   2.5 mg at 02/13/19 4403  . sodium chloride flush (NS) 0.9 % injection 10-40 mL  10-40 mL Intracatheter PRN Toy Baker, MD   10 mL at 02/11/19 0347     Allergies  Allergen Reactions  . Statins Other (See Comments)    liver effects  . Cyproheptadine Other (See Comments)    Unable to urinate    BP  128/78 (BP Location: Right Arm)   Pulse 99   Temp 97.8 F (36.6 C) (Oral)   Resp (!) 22   Ht 5\' 8"  (1.727 m)   Wt 87.8 kg   SpO2 92%   BMI 29.43 kg/m   Dg Chest 2 View  Result Date: 02/08/2019 CLINICAL DATA:  Altered mentation and confusion. EXAM: CHEST - 2 VIEW COMPARISON:  11/25/2018 FINDINGS: Stable cardiomegaly with aortic atherosclerosis and post CABG change. Low lung volumes with interstitial edema is noted. Slightly more confluent right perihilar airspace disease is identified. Pneumonia is not excluded versus atelectasis. No significant effusion or pneumothorax. Port catheter tip terminates in the distal SVC. IMPRESSION: 1. Cardiomegaly with aortic atherosclerosis and post CABG change. 2. Low lung volumes with interstitial edema. 3. Slightly more confluent airspace opacity in the right perihilar region. Pneumonia is not excluded. Electronically Signed   By: Ashley Royalty M.D.   On: 02/08/2019 16:44   Ct Head Wo Contrast  Result Date: 02/08/2019 CLINICAL DATA:  Altered level of consciousness, sundowning, history coronary artery disease post CABG and MI, ischemic cardiomyopathy, hypertension, bladder cancer EXAM: CT HEAD WITHOUT CONTRAST TECHNIQUE: Contiguous axial images were obtained from the base of the skull through the vertex without intravenous contrast. Sagittal and coronal MPR images reconstructed from axial data set. COMPARISON:  None FINDINGS: Brain: Generalized atrophy. Normal ventricular morphology. No midline shift or mass effect. Small vessel chronic ischemic changes of deep cerebral white matter. No intracranial hemorrhage, mass lesion, evidence of acute infarction, or extra-axial fluid collection. Vascular: Atherosclerotic calcifications of internal carotid arteries at skull base Skull: Intact Sinuses/Orbits: Clear Other: N/A IMPRESSION: Atrophy with small vessel chronic ischemic changes of deep cerebral white matter. No acute intracranial abnormalities. Electronically Signed    By: Lavonia Dana M.D.   On: 02/08/2019 17:06   Ct Renal Stone Study  Result Date: 02/08/2019 CLINICAL DATA:  Hematuria, suspected renal parenchymal cause, UTI treated with Bactrim this week, history coronary artery disease post MI and CABG, bladder cancer, hypertension, ischemic cardiomyopathy, GERD, CHF, former smoker EXAM: CT ABDOMEN AND PELVIS WITHOUT CONTRAST TECHNIQUE: Multidetector CT imaging of the abdomen and pelvis was performed following the standard protocol without IV contrast. Sagittal and coronal MPR images reconstructed from axial data set. Oral contrast was not administered. COMPARISON:  11/06/2018 FINDINGS: Lower chest: BILATERAL pleural effusions, small LEFT and moderate RIGHT. Peribronchial thickening. Minimal atelectasis RIGHT lung base. Hepatobiliary: Gallbladder and liver normal appearance Pancreas: Normal appearance Spleen: Normal appearance Adrenals/Urinary Tract: Adrenal glands normal appearance. Atrophic LEFT kidney. BILATERAL nephrostomy tubes. No definite renal mass. Mild  LEFT renal collecting system dilatation. BILATERAL ureteral stents. Mildly thickened bladder wall question due to tumor, cystitis or chronic outlet obstruction suprapubic catheter present. Stomach/Bowel: Transverse double-barrel colostomy. Stool in RIGHT colon. Decompressed distal colon. Stomach and small bowel loops unremarkable. Vascular/Lymphatic: Atherosclerotic calcifications aorta, iliac arteries, coronary arteries. Aorta normal caliber. No definite abdominal or pelvic adenopathy. Reproductive: Minimal prostatic enlargement. Other: LEFT inguinal hernia containing fat. Scattered subcutaneous and intra-abdominal tissue edema. No free air or free fluid. Minimal presacral stranding. Musculoskeletal: Bones demineralized with prior L3-L4 posterior fusion scattered degenerative disc disease changes. IMPRESSION: BILATERAL nephrostomy tubes and ureteral stents with mild persistent dilatation of the LEFT renal collecting  system. Bladder wall thickening which could reflect tumor, L obstruction or cystitis. Small LEFT inguinal hernia containing fat. BILATERAL pleural effusions RIGHT greater than LEFT. Electronically Signed   By: Lavonia Dana M.D.   On: 02/08/2019 17:13   Ir Ext Nephroureteral Cath Exchange  Result Date: 01/17/2019 INDICATION: Left antegrade nephroureteral catheter was accidentally severed externally during dressing change and requires exchange. EXAM: IR EXT NEPHROURETERAL CATH EXCHANGE COMPARISON:  None. MEDICATIONS: None ANESTHESIA/SEDATION: None CONTRAST:  5 mL Isovue-300-administered into the collecting system(s) FLUOROSCOPY TIME:  Fluoroscopy Time: 54 seconds.  8.1 mGy. COMPLICATIONS: None immediate. PROCEDURE: Informed written consent was obtained from the patient after a thorough discussion of the procedural risks, benefits and alternatives. All questions were addressed. Maximal Sterile Barrier Technique was utilized including caps, mask, sterile gowns, sterile gloves, sterile drape, hand hygiene and skin antiseptic. A timeout was performed prior to the initiation of the procedure. The pre-existing left-sided 10 French, 24 cm double-J length nephroureteral catheter was removed over a guidewire. A new catheter was then advanced. After forming the distal portion in the bladder and the proximal J portion in the renal pelvis, the external catheter was injected with contrast material to evaluate internal drainage. The catheter was then flushed and capped. FINDINGS: The new catheter is well positioned with J portions extending from the renal pelvis into the bladder. Injected contrast flows well into the bladder lumen. The external catheter was capped. IMPRESSION: Exchange and replacement of damaged left-sided nephroureteral catheter with a new 10 French catheter. Internal J segments extend from the renal pelvis into the bladder with good internal drainage present. The external catheter was capped. Electronically  Signed   By: Aletta Edouard M.D.   On: 01/17/2019 11:22    Note: This dictation was prepared with Dragon/digital dictation along with [employer]. Any transcriptional errors that result from this process are unintentional.   .Adin Hector, M.D., F.A.C.S. Gastrointestinal and Minimally Invasive Surgery [facility] [address], [place], [place] [address] [phone] Main / Paging  02/13/2019 12:23 PM

## 2019-02-13 NOTE — Progress Notes (Signed)
Patient began to pull at lines and mouth. Patient pulled out a tooth then attempted to climb out of bed. Prn given for anxiety. The sitter and I attempted to reorient patient and give emotional support. Patient now currently resting in bed. On call provider notified will continue to monitor patient.

## 2019-02-13 NOTE — Procedures (Signed)
B JJ ureteral stent palcement 10 Fr EBL 0 Comp 0

## 2019-02-13 NOTE — Progress Notes (Signed)
Palliative care progress note  Reason for consult: Goals of care in light of his metastatic cancer  I met today with patient, his wife, and step-son.  Chart review reveals that he was confused/threatening last night, but he is oriented and cooperative at time of my encounter.    We discussed his clinical course, including sharing with him regarding his behaviors prior to admission and at night.  He reports understanding and being surprised and embarrassed hearing about these.  Discussed that the reason for discussion now is to work to determine safest plan when time comes for discharge and need to have guns and knives locked up in the home.  He expressed understanding this and reports being agreeable to any interventions recommended to prevent further episodes.  His wife is planning on transition home with hospice services after stent placement and he is medically ready for discharge, but we discussed need to ensure safety and that his agitation is managed.  - Dilaudid as needed. - His wife is interested in hospice services at home, but there remains concern about safety when he transitions home.  His stepson has locked up or removed all of his firearms and knives in the home.  We discussed this with Charles Coffey today as his mental status was clear.  Dr. Wyline Copas transitioned from zyprexa to Saphris to see if more effective. - PMT to continue to follow and reassess plan following stent placement and evaluation of his mental status.  Total time: 50 minutes Greater than 50%  of this time was spent counseling and coordinating care related to the above assessment and plan.  Charles Rough, MD [place] Team [phone]

## 2019-02-13 NOTE — Progress Notes (Signed)
OT Cancellation Note  Patient Details Name: Charles Coffey MRN: [id number] DOB: Mar 22, 1935   Cancelled Treatment:    Reason Eval/Treat Not Completed: Patient at procedure or test/ unavailable, spoke with RN, pt to go for stent placement soon. Will hold and follow up for OT eval as appropriate. Also noted pt's family to have meeting today to discuss POC including possibility of home with hospice services - will follow up for updated notes.   Lou Cal, OT [facility] Pager [phone] Office [phone]   Raymondo Band 02/13/2019, 3:46 PM

## 2019-02-13 NOTE — Plan of Care (Signed)
Patient particularly difficult in the evenings.  He becomes anxious and then uses poor judgement.

## 2019-02-13 NOTE — Progress Notes (Signed)
About 0100, patient was awake and trying to stand up.  The Sitter and I talked with him and his desire was to get his hair washed.   We proceeded to use one of our shampoo caps and he really enjoyed the warm cap.   I left to attend to another patient.   I heard running and found that the patient had become very agitated and grabbed the sitter.  She was very frightened.   He told me when I came into try and calm him down, that he thought she had a gun.   She showed him all the contents of her pockets and he apologized to her.  I sat with him and tried to continue to calm him.   Currently he is lying in bed, messing with his phone, but basically calm.

## 2019-02-14 LAB — CULTURE, BLOOD (ROUTINE X 2)
CULTURE: NO GROWTH
Culture: NO GROWTH
Special Requests: ADEQUATE
Special Requests: ADEQUATE

## 2019-02-14 LAB — COMPREHENSIVE METABOLIC PANEL
ALT: 163 U/L — ABNORMAL HIGH (ref 0–44)
AST: 43 U/L — ABNORMAL HIGH (ref 15–41)
Albumin: 3.3 g/dL — ABNORMAL LOW (ref 3.5–5.0)
Alkaline Phosphatase: 68 U/L (ref 38–126)
Anion gap: 8 (ref 5–15)
BUN: 35 mg/dL — ABNORMAL HIGH (ref 8–23)
CO2: 20 mmol/L — ABNORMAL LOW (ref 22–32)
Calcium: 9.4 mg/dL (ref 8.9–10.3)
Chloride: 109 mmol/L (ref 98–111)
Creatinine, Ser: 1.61 mg/dL — ABNORMAL HIGH (ref 0.61–1.24)
GFR calc Af Amer: 46 mL/min — ABNORMAL LOW (ref >60)
GFR calc non Af Amer: 40 mL/min — ABNORMAL LOW (ref >60)
Glucose, Bld: 111 mg/dL — ABNORMAL HIGH (ref 70–99)
Potassium: 4.8 mmol/L (ref 3.5–5.1)
Sodium: 137 mmol/L (ref 135–145)
Total Bilirubin: 1.7 mg/dL — ABNORMAL HIGH (ref 0.3–1.2)
Total Protein: 7.2 g/dL (ref 6.5–8.1)

## 2019-02-14 LAB — BLOOD GAS, ARTERIAL
ACID-BASE DEFICIT: 3.8 mmol/L — AB (ref 0.0–2.0)
Bicarbonate: 19.4 mmol/L — ABNORMAL LOW (ref 20.0–28.0)
Drawn by: 257701
O2 Saturation: 96.7 %
Patient temperature: 98.6
pCO2 arterial: 30 mmHg — ABNORMAL LOW (ref 32.0–48.0)
pH, Arterial: 7.427 (ref 7.350–7.450)
pO2, Arterial: 83.9 mmHg (ref 83.0–108.0)

## 2019-02-14 LAB — CBC
HCT: 26.5 % — ABNORMAL LOW (ref 39.0–52.0)
Hemoglobin: 8 g/dL — ABNORMAL LOW (ref 13.0–17.0)
MCH: 32.5 pg (ref 26.0–34.0)
MCHC: 30.2 g/dL (ref 30.0–36.0)
MCV: 107.7 fL — AB (ref 80.0–100.0)
NRBC: 0 % (ref 0.0–0.2)
Platelets: 33 10*3/uL — ABNORMAL LOW (ref 150–400)
RBC: 2.46 MIL/uL — ABNORMAL LOW (ref 4.22–5.81)
RDW: 23.4 % — ABNORMAL HIGH (ref 11.5–15.5)
WBC: 8.4 10*3/uL (ref 4.0–10.5)

## 2019-02-14 MED ORDER — ASENAPINE MALEATE 5 MG SL SUBL
5.0000 mg | SUBLINGUAL_TABLET | Freq: Two times a day (BID) | SUBLINGUAL | Status: DC
  Filled 2019-02-14 (×3): qty 1

## 2019-02-14 MED ORDER — ASENAPINE MALEATE 5 MG SL SUBL
5.0000 mg | SUBLINGUAL_TABLET | Freq: Two times a day (BID) | SUBLINGUAL | Status: DC
  Administered 2019-02-14 – 2019-02-18 (×8): 5 mg via SUBLINGUAL
  Filled 2019-02-14 (×7): qty 1

## 2019-02-14 MED ORDER — FUROSEMIDE 40 MG PO TABS
40.0000 mg | ORAL_TABLET | Freq: Every day | ORAL | Status: DC
  Administered 2019-02-14 – 2019-02-15 (×2): 40 mg via ORAL
  Filled 2019-02-14 (×2): qty 1

## 2019-02-14 NOTE — Progress Notes (Signed)
Palliative care progress note  Reason for consult: Goals of care in light of his metastatic cancer  I met today with patient, his wife, and step-son.  Discussed with bedside RN, also with Air cabin crew.   Chart review reveals that he was confused/threatening last night, and this morning, the patient is confused and talks about navy seals. He demonstrates a lot of paranoid behavior.   His wife is planning on transition home with hospice services after stent placement and he is medically ready for discharge, but we discussed need to ensure safety and that his agitation is managed. She has severe caregiver stress, she is tearful. She is asking for list of private duty sitters she can hire. She remains torn about whether or not she will be able to handle him at home.   BP 111/78 (BP Location: Right Arm)   Pulse 95   Temp 98 F (36.7 C) (Oral)   Resp (!) 32   Ht _0  (1.727 m)   Wt 87.8 kg   SpO2 100%   BMI 29.43 kg/m  Labs and imaging noted  Awake alert Confused No distress Regular breathing No edema Non focal  - Dilaudid as needed. - His wife is interested in hospice services at home, but there remains concern about safety when he transitions home.  His stepson has locked up or removed all of his firearms and knives in the home.    Ongoing trials of appropriate medications is underway. Repeat Psych eval is also requested. Family now asking if the patient will benefit from inpatient psych.   Additionally, discussed with TRH MD about ruling out brain mets, consideration for brain imaging to be done in the near future.    - PMT to continue to follow and reassess goals of care.   Total time: 35 minutes Greater than 50%  of this time was spent counseling and coordinating care related to the above assessment and plan.  Loistine Chance, MD [facility] [phone]

## 2019-02-14 NOTE — Consult Note (Signed)
[place] Nurse ostomy consult note Recent acute psychiatric change with delirium and self harm (pulled his tooth out).  Was trying to remove his ostomy appliance.  Currently has pouch in place and is eating breakfast.  No needs at this time.  Supplies are left at the bedside if needed.  Stoma type/location: 1 3/4" slightly oval shaped.   Stomal assessment/size: pink andmoist Peristomal assessment: not assessed Treatment options for stomal/peristomal skin: 1 piece pouch Output soft brown stool Ostomy pouching: 1pc.flat with barrier ring Education provided: None.  Patient appears alert and oriented.  Answers questions appropriately  Enrolled patient in [place] program: No Will not follow at this time.  Please re-consult if needed.  Domenic Moras MSN, RN, FNP-BC CWON Wound, Ostomy, Continence Nurse Pager [phone]

## 2019-02-14 NOTE — Consult Note (Addendum)
Charles Coffey  02/14/2019 12:38 PM Charles Coffey  MRN:  [id number] Subjective:  Charles Coffey was last seen on 2/13 by the psychiatry consult service. He remains delirious and agitated. He pulled out his tooth. He has ben trying to remove his ostomy appliance. Psychiatry is reconsulted for assistance with agitation. He has not responded to Zyprexa or Saphris. Saphris was started on 2/17.   On interview, Charles Coffey is oriented to person and place.  He believes that the date is February 2019.  He reports that he is doing well.  He does not remember feeling confused.  He denies SI, HI or AVH.  He reports that he is eating well.  His family is at bedside report that he ate a big meal today.  His wife and son request to speak to this notewriter outside.  They report waxing and waning mental status and during these times he is paranoid and confused.  These episodes typically occur in the evening and sometimes early morning.  Safety concerns were discussed in terms of disposition for patient.  If patient is discharged the family would hire an aide at home to assist with patient's care.  They do not wish to pursue inpatient psychiatric care at this time.  They have removed guns and weapons from the home.  They are aware of having low threshold to bring patient back to the hospital if they have safety concerns.  They overall feel like his mental status is gradually improving.   Principal Problem: Delirium Diagnosis:  Principal Problem:   Delirium Active Problems:   Hyperlipidemia LDL goal <70   HTN (hypertension)   Coronary artery disease of native heart with stable angina pectoris (HCC)   GERD   RLS (restless legs syndrome)   Chronic anemia   CKD (chronic kidney disease) stage 3, GFR 30-59 ml/min (HCC)   Hydronephrosis due to obstructive malignant neoplasm of bladder (HCC)   Elevated troponin   Urothelial carcinoma of bladder (HCC)   Dehydration   Thrombocytopathia (HCC)   Chronic  systolic heart failure (HCC)   Lower urinary tract infectious disease   Hyponatremia   Agitation   AKI (acute kidney injury) ([place])   Hypokalemia   CAP (community acquired pneumonia)  Total Time spent with patient: 15 minutes  Past Psychiatric History: Denies   Past Medical History:  Past Medical History:  Diagnosis Date  . Aortic atherosclerosis ([place])   . BPH with urinary obstruction   . CAD (coronary artery disease)    a.  MI 1995, CABG x 3 2002 (patient says that he had LIMA and RIMA grafts). b. ETT-Cardiolite (10/15) with EF 48%, apical scar, no ischemia. c. Nuc 07/2017 abnormal -> cath was performed,  patent LIMA to LAD and SVG to OM 2. RIMA to RCA is atretic. The right coronary artery is occluded distally with extensive collaterals from the LAD, medical therapy.   . Cancer [facility])    bladder cancer  . Cervical spondylosis without myelopathy 17-Sep-202017  . Chronic low back pain    sees Dr. Kary Kos   . GERD (gastroesophageal reflux disease)   . Hiatal hernia   . Hyperlipidemia   . Hypertension   . IBS (irritable bowel syndrome)   . Ischemic cardiomyopathy   . Myocardial infarction ([place])   . RBBB   . Restless legs syndrome   . Statin intolerance   . Syncope    a. in 2015 - no apparent cause, was taking sleep medicine at  the time. Cardiac workup unremarkable.  . Tubular adenoma of colon     Past Surgical History:  Procedure Laterality Date  . COLONOSCOPY  10/17/2014   per Dr. Hilarie Fredrickson, tubular adenomas, repeat in 3 yrs   . COLONOSCOPY WITH PROPOFOL N/A 12/01/2017   Procedure: COLONOSCOPY WITH PROPOFOL;  Surgeon: Milus Banister, MD;  [facility];  Service: Endoscopy;  Laterality: N/A;  . CYSTOSCOPY W/ RETROGRADES Left 11/25/2017   Procedure: CYSTOSCOPY WITH RETROGRADE PYELOGRAM;  Surgeon: Irine Seal, MD;  [facility];  Service: Urology;  Laterality: Left;  . HEART BYPASS    . IR CATHETER TUBE CHANGE  11/08/2018  . IR CATHETER TUBE CHANGE  12/26/2018  . IR  CATHETER TUBE CHANGE  01/08/2019  . IR CATHETER TUBE CHANGE  01/13/2019  . IR CONVERT LEFT NEPHROSTOMY TO NEPHROURETERAL CATH  09/20/2018  . IR CONVERT RIGHT NEPHROSTOMY TO NEPHROURETERAL CATH  02/04/2018  . IR CONVERT RIGHT NEPHROSTOMY TO NEPHROURETERAL CATH  08/23/2018  . IR EXT NEPHROURETERAL CATH EXCHANGE  04/11/2018  . IR EXT NEPHROURETERAL CATH EXCHANGE  07/04/2018  . IR EXT NEPHROURETERAL CATH EXCHANGE  09/20/2018  . IR EXT NEPHROURETERAL CATH EXCHANGE  11/08/2018  . IR EXT NEPHROURETERAL CATH EXCHANGE  11/08/2018  . IR EXT NEPHROURETERAL CATH EXCHANGE  12/26/2018  . IR EXT NEPHROURETERAL CATH EXCHANGE  12/26/2018  . IR EXT NEPHROURETERAL CATH EXCHANGE  01/17/2019  . IR FLUORO GUIDE CV LINE RIGHT  12/27/2017  . IR FLUORO GUIDED NEEDLE PLC ASPIRATION/INJECTION LOC  08/19/2018  . IR NEPHROSTOGRAM LEFT THRU EXISTING ACCESS  12/24/2017  . IR NEPHROSTOMY EXCHANGE LEFT  01/28/2018  . IR NEPHROSTOMY EXCHANGE LEFT  02/04/2018  . IR NEPHROSTOMY EXCHANGE LEFT  02/25/2018  . IR NEPHROSTOMY EXCHANGE LEFT  07/04/2018  . IR NEPHROSTOMY EXCHANGE LEFT  07/05/2018  . IR NEPHROSTOMY EXCHANGE LEFT  08/09/2018  . IR NEPHROSTOMY EXCHANGE LEFT  08/16/2018  . IR NEPHROSTOMY EXCHANGE LEFT  08/23/2018  . IR NEPHROSTOMY EXCHANGE RIGHT  12/24/2017  . IR NEPHROSTOMY EXCHANGE RIGHT  01/28/2018  . IR NEPHROSTOMY EXCHANGE RIGHT  04/11/2018  . IR NEPHROSTOMY EXCHANGE RIGHT  07/05/2018  . IR NEPHROSTOMY EXCHANGE RIGHT  08/09/2018  . IR NEPHROSTOMY EXCHANGE RIGHT  08/19/2018  . IR NEPHROSTOMY PLACEMENT LEFT  11/28/2017  . IR NEPHROSTOMY PLACEMENT RIGHT  12/03/2017  . IR PATIENT EVAL TECH 0-60 MINS  03/24/2018  . IR PATIENT EVAL TECH 0-60 MINS  03/28/2018  . IR PATIENT EVAL TECH 0-60 MINS  04/25/2018  . IR PATIENT EVAL TECH 0-60 MINS  06/20/2018  . IR URETERAL STENT PLACEMENT EXISTING ACCESS LEFT  02/13/2019  . IR URETERAL STENT PLACEMENT EXISTING ACCESS RIGHT  02/13/2019  . IR US GUIDE VASC ACCESS RIGHT  12/27/2017  . LAPAROSCOPY N/A 12/01/2017    Procedure: LAPAROSCOPIC DIVERTING OSTOMY;  Surgeon: Stark Klein, MD;  [facility];  Service: General;  Laterality: N/A;  . LEFT HEART CATH AND CORS/GRAFTS ANGIOGRAPHY N/A 08/25/2017   Procedure: LEFT HEART CATH AND CORS/GRAFTS ANGIOGRAPHY;  Surgeon: Wellington Hampshire, MD;  Location: [facility];  Service: Cardiovascular;  Laterality: N/A;  . LUMBAR FUSION  2003   L3-L4  . PROSTATE SURGERY  06-27-12   per Dr. Roni Bread, had CTT  . TONSILLECTOMY    . TRANSURETHRAL RESECTION OF BLADDER TUMOR N/A 11/25/2017   Procedure: TRANSURETHRAL RESECTION OF BLADDER TUMOR (TURBT);  Surgeon: Irine Seal, MD;  [facility];  Service: Urology;  Laterality: N/A;   Family History:  Family History  Problem Relation Age of Onset  . Heart attack Mother   . Aneurysm Father        femoral artery  . Heart disease Brother   . Heart disease Maternal Uncle        x 2  . Colon cancer Neg Hx   . Esophageal cancer Neg Hx   . Pancreatic cancer Neg Hx   . Kidney disease Neg Hx   . Liver disease Neg Hx    Family Psychiatric  History: Brother-dementia  Social History:  Social History   Substance and Sexual Activity  Alcohol Use Not Currently  . Alcohol/week: 2.0 standard drinks  . Types: 1 Glasses of wine, 1 Cans of beer per week     Social History   Substance and Sexual Activity  Drug Use No    Social History   Socioeconomic History  . Marital status: Married    Spouse name: Not on file  . Number of children: 5  . Years of education: Some coll  . Highest education level: Not on file  Occupational History  . Occupation: retired  Scientific laboratory technician  . Financial resource strain: Not on file  . Food insecurity:    Worry: Not on file    Inability: Not on file  . Transportation needs:    Medical: Not on file    Non-medical: Not on file  Tobacco Use  . Smoking status: Former Smoker    Types: Cigarettes    Last attempt to quit: 12/28/1978    Years since quitting: 40.1  . Smokeless tobacco:  Never Used  Substance and Sexual Activity  . Alcohol use: Not Currently    Alcohol/week: 2.0 standard drinks    Types: 1 Glasses of wine, 1 Cans of beer per week  . Drug use: No  . Sexual activity: Not Currently  Lifestyle  . Physical activity:    Days per week: Not on file    Minutes per session: Not on file  . Stress: Not on file  Relationships  . Social connections:    Talks on phone: Not on file    Gets together: Not on file    Attends religious service: Not on file    Active member of club or organization: Not on file    Attends meetings of clubs or organizations: Not on file    Relationship status: Not on file  Other Topics Concern  . Not on file  Social History Narrative   Lives at home w/ his wife   Right-handed   5 cups of coffee per day    Sleep: Good  Appetite:  Good  Current Medications: Current Facility-Administered Medications  Medication Dose Route Frequency Provider Last Rate Last Dose  . 0.9 %  sodium chloride infusion   Intravenous PRN Toy Baker, MD   Stopped at 02/14/19 0700  . acetaminophen (TYLENOL) tablet 650 mg  650 mg Oral Q6H PRN Toy Baker, MD       Or  . acetaminophen (TYLENOL) suppository 650 mg  650 mg Rectal Q6H PRN Doutova, Anastassia, MD      . alum & mag hydroxide-simeth (MAALOX/MYLANTA) 200-200-20 MG/5ML suspension 30 mL  30 mL Oral Q4H PRN Donne Hazel, MD   30 mL at 02/12/19 0125  . Asenapine Maleate (SAPHRIS) sublingual tablet 5 mg  5 mg Sublingual BID Donne Hazel, MD   5 mg at 02/14/19 1152  . azithromycin (ZITHROMAX) tablet 500 mg  500 mg Oral QHS Bell,  Ronaldo Miyamoto, RPH   500 mg at 02/13/19 2108  . ceFEPIme (MAXIPIME) 2 g in sodium chloride 0.9 % 100 mL IVPB  2 g Intravenous Q12H Lenis Noon, RPH 200 mL/hr at 02/14/19 1206 2 g at 02/14/19 1206  . famotidine (PEPCID) tablet 20 mg  20 mg Oral BID Donne Hazel, MD   20 mg at 02/14/19 1156  . feeding supplement (ENSURE ENLIVE) (ENSURE ENLIVE) liquid 237 mL   237 mL Oral BID BM Donne Hazel, MD      . HYDROmorphone (DILAUDID) injection 0.5-1 mg  0.5-1 mg Intravenous Q3H PRN Micheline Rough, MD   1 mg at 02/13/19 2133  . mirabegron ER (MYRBETRIQ) tablet 25 mg  25 mg Oral Daily Doutova, Anastassia, MD   25 mg at 02/14/19 1155  . multivitamin with minerals tablet 1 tablet  1 tablet Oral Daily Donne Hazel, MD   1 tablet at 02/14/19 1156  . ondansetron (ZOFRAN) tablet 4 mg  4 mg Oral Q6H PRN Toy Baker, MD       Or  . ondansetron (ZOFRAN) injection 4 mg  4 mg Intravenous Q6H PRN Doutova, Anastassia, MD      . oxyCODONE-acetaminophen (PERCOCET/ROXICET) 5-325 MG per tablet 1-2 tablet  1-2 tablet Oral Q4H PRN Toy Baker, MD   2 tablet at 02/13/19 1352   And  . oxyCODONE (Oxy IR/ROXICODONE) immediate release tablet 5-10 mg  5-10 mg Oral Q4H PRN Toy Baker, MD   10 mg at 02/11/19 0755  . PARoxetine (PAXIL) tablet 10 mg  10 mg Oral Daily Doutova, Anastassia, MD   10 mg at 02/14/19 1157  . pramipexole (MIRAPEX) tablet 2.5 mg  2.5 mg Oral BID Donne Hazel, MD   2.5 mg at 02/14/19 1155  . sodium chloride flush (NS) 0.9 % injection 10-40 mL  10-40 mL Intracatheter PRN Toy Baker, MD   10 mL at 02/11/19 0347  . sodium chloride flush (NS) 0.9 % injection 5 mL  5 mL Intracatheter Q8H Hoss, Arthur, MD   5 mL at 02/14/19 0500    Lab Results:  Results for orders placed or performed during the hospital encounter of 02/08/19 (from the past 48 hour(s))  Basic metabolic panel     Status: Abnormal   Collection Time: 02/13/19  3:39 AM  Result Value Ref Range   Sodium 137 135 - 145 mmol/L   Potassium 4.6 3.5 - 5.1 mmol/L   Chloride 109 98 - 111 mmol/L   CO2 23 22 - 32 mmol/L   Glucose, Bld 107 (H) 70 - 99 mg/dL   BUN 31 (H) 8 - 23 mg/dL   Creatinine, Ser 1.65 (H) 0.61 - 1.24 mg/dL   Calcium 9.1 8.9 - 10.3 mg/dL   GFR calc non Af Amer 38 (L) >60 mL/min   GFR calc Af Amer 44 (L) >60 mL/min   Anion gap 5 5 - 15    Comment:  Performed at [facility], [place] [address] [place], [place] [address]  CBC     Status: Abnormal   Collection Time: 02/13/19  3:39 AM  Result Value Ref Range   WBC 6.1 4.0 - 10.5 K/uL   RBC 2.20 (L) 4.22 - 5.81 MIL/uL   Hemoglobin 7.2 (L) 13.0 - 17.0 g/dL   HCT 23.7 (L) 39.0 - 52.0 %   MCV 107.7 (H) 80.0 - 100.0 fL   MCH 32.7 26.0 - 34.0 pg   MCHC 30.4 30.0 - 36.0 g/dL  RDW 23.1 (H) 11.5 - 15.5 %   Platelets 33 (L) 150 - 400 K/uL    Comment: Immature Platelet Fraction may be clinically indicated, consider ordering this additional test [id number] CONSISTENT WITH PREVIOUS RESULT    nRBC 0.0 0.0 - 0.2 %    Comment: Performed at [facility], [place] [address], [place], [place] [address]  Ammonia     Status: None   Collection Time: 02/13/19 12:10 PM  Result Value Ref Range   Ammonia 13 9 - 35 umol/L    Comment: Performed at [facility], [place] [address] [place], [place] [address]  Hepatic function panel     Status: Abnormal   Collection Time: 02/13/19  1:08 PM  Result Value Ref Range   Total Protein 6.5 6.5 - 8.1 g/dL   Albumin 2.9 (L) 3.5 - 5.0 g/dL   AST 46 (H) 15 - 41 U/L   ALT 185 (H) 0 - 44 U/L   Alkaline Phosphatase 67 38 - 126 U/L   Total Bilirubin 1.5 (H) 0.3 - 1.2 mg/dL   Bilirubin, Direct 0.5 (H) 0.0 - 0.2 mg/dL   Indirect Bilirubin 1.0 (H) 0.3 - 0.9 mg/dL    Comment: Performed at [facility], [place] [address] [place], [place] [address]  Protime-INR     Status: Abnormal   Collection Time: 02/13/19  1:08 PM  Result Value Ref Range   Prothrombin Time 16.2 (H) 11.4 - 15.2 seconds   INR 1.31     Comment: Performed at [facility], [place] [address] [place], [place] [address]  Comprehensive metabolic panel     Status: Abnormal   Collection Time: 02/14/19  8:28 AM  Result Value Ref Range   Sodium 137 135 - 145 mmol/L   Potassium 4.8 3.5 - 5.1 mmol/L   Chloride 109 98 - 111 mmol/L    CO2 20 (L) 22 - 32 mmol/L   Glucose, Bld 111 (H) 70 - 99 mg/dL   BUN 35 (H) 8 - 23 mg/dL   Creatinine, Ser 1.61 (H) 0.61 - 1.24 mg/dL   Calcium 9.4 8.9 - 10.3 mg/dL   Total Protein 7.2 6.5 - 8.1 g/dL   Albumin 3.3 (L) 3.5 - 5.0 g/dL   AST 43 (H) 15 - 41 U/L   ALT 163 (H) 0 - 44 U/L   Alkaline Phosphatase 68 38 - 126 U/L   Total Bilirubin 1.7 (H) 0.3 - 1.2 mg/dL   GFR calc non Af Amer 40 (L) >60 mL/min   GFR calc Af Amer 46 (L) >60 mL/min   Anion gap 8 5 - 15    Comment: Performed at [facility], [place] [address] [place],  [address]  CBC     Status: Abnormal   Collection Time: 02/14/19  8:28 AM  Result Value Ref Range   WBC 8.4 4.0 - 10.5 K/uL   RBC 2.46 (L) 4.22 - 5.81 MIL/uL   Hemoglobin 8.0 (L) 13.0 - 17.0 g/dL   HCT 26.5 (L) 39.0 - 52.0 %   MCV 107.7 (H) 80.0 - 100.0 fL   MCH 32.5 26.0 - 34.0 pg   MCHC 30.2 30.0 - 36.0 g/dL   RDW 23.4 (H) 11.5 - 15.5 %   Platelets 33 (L) 150 - 400 K/uL    Comment: Immature Platelet Fraction may be clinically indicated, consider ordering this additional test [id number] CONSISTENT WITH PREVIOUS RESULT    nRBC 0.0 0.0 - 0.2 %  Comment: Performed at [facility], [place] [address] [place], [place] [address]    Blood Alcohol level:  Lab Results  Component Value Date   [facility] <10 02/08/2019    Musculoskeletal: Strength & Muscle Tone: within normal limits Gait & Station: UTA since patient is sitting in a chair. Patient leans: N/A  Psychiatric Specialty Exam: Physical Exam  Nursing Coffey and vitals reviewed. Constitutional: He appears well-developed and well-nourished.  HENT:  Head: Normocephalic and atraumatic.  Neck: Normal range of motion.  Respiratory: Effort normal.  Musculoskeletal: Normal range of motion.  Neurological: He is alert.  Oriented to person and place.   Psychiatric: He has a normal mood and affect. His speech is normal and behavior is normal. Judgment and thought  content normal. Cognition and memory are impaired.    Review of Systems  Respiratory: Positive for shortness of breath.   Gastrointestinal: Negative for constipation, diarrhea, nausea and vomiting.  Psychiatric/Behavioral: Negative for depression, hallucinations and suicidal ideas. The patient does not have insomnia.   All other systems reviewed and are negative.   Blood pressure 111/78, pulse 95, temperature 98 F (36.7 C), temperature source Oral, resp. rate (!) 32, height 5\' 8"  (1.727 m), weight 87.8 kg, SpO2 100 %.Body mass index is 29.43 kg/m.  General Appearance: Fairly Groomed, elderly, Caucasian male, wearing a hospital gown with corrective lenses who is sitting in a chair. NAD.   Eye Contact:  Good  Speech:  Clear and Coherent and Normal Rate  Volume:  Normal  Mood:  Euthymic  Affect:  Appropriate and Congruent  Thought Process:  Goal Directed, Linear and Descriptions of Associations: Intact  Orientation:  Other:  Person and place.  Thought Content:  Logical  Suicidal Thoughts:  No  Homicidal Thoughts:  No  Memory:  Immediate;   Fair Recent;   Fair Remote;   Fair  Judgement:  Fair  Insight:  Fair  Psychomotor Activity:  Normal  Concentration:  Concentration: Good and Attention Span: Good  Recall:  Good  Fund of Knowledge:  Fair  Language:  Good  Akathisia:  No  Handed:  Right  AIMS (if indicated):   N/A  Assets:  Agricultural consultant Housing Resilience Social Support  ADL's:  Impaired  Cognition: Impaired with short term memory deficits.   Sleep:   Okay    Assessment:  Charles Coffey is a 84 y.o. male who was admitted with acute encepahlopathy with elevated troponin. He presented with worsening altered mental status in the setting of recent UTIs. He is receiving treatment for CAP in the hospital. His mental status continues to wax and wane with episodic confusion and paranoia. Today he is oriented to person and place. He is  appropriate in behavior. He denies SI, HI or AVH. Recommend moving nighttime Saphris dose to be given during dinner so that it may hopefully help with sundowning as patient may have an underlying dementia. Disposition is pending safety concerns and this has been discussed with family who do not want to pursue psychiatric hospitalization at this time and plan to hire additional care at home.    Treatment Plan Summary: -Continue Saphris 5 mg BID for agitation/psychosis.  -Continue Paxil 10 mg daily for mood.  -EKG reviewed and QTc 487 on 2/15. Please closely monitor when starting or increasing QTc prolonging agents.  -Psychiatry will follow patient as needed.   Faythe Dingwall, DO 02/14/2019, 12:38 PM

## 2019-02-14 NOTE — Progress Notes (Signed)
PROGRESS NOTE    Charles Coffey  URK:[id number] DOB: 1935-10-03 DOA: 02/08/2019 PCP: [name], MD    Brief Narrative:  84 y.o. male with medical history significant of CAD s/p CABG, GERD,metastatic bladder cancer status post suprapubic catheter, nephrostomy tubes, ostomy, dementia with  sundowning  Presented with   agitation and homoccidal ideations.  Patient recently started Bactrim for UTI.  Early in the week she was treated with Augmentin after was found to be febrile up to 102 he was seen by urology and urine sample was taken.  He continued to be febrile Monday Tuesday and Wednesday.  Finally has improved 1 week ago.  He has been having worsening confusion while febrile.  Decreased urinary output.  He actually stopped using his Lasix in the past few days. Patient has been having sundowning and was seen by oncologist who prescribed  Haldol 1 mg instructed to take 1-2 before bedtime.  Yesterday seemed to help with agitation. He has been getting gradually weaker has been having some shortness of breath Family  given  Him a small THC cookie he slept all day and became very agitated. Chasing them with knives. Trying to take out his ostomy bag not understanding why its there. Police was then called and patient was tased by the police.   Assessment & Plan:   Principal Problem:   Delirium Active Problems:   Hyperlipidemia LDL goal <70   HTN (hypertension)   Coronary artery disease of native heart with stable angina pectoris (HCC)   GERD   RLS (restless legs syndrome)   Chronic anemia   CKD (chronic kidney disease) stage 3, GFR 30-59 ml/min (HCC)   Hydronephrosis due to obstructive malignant neoplasm of bladder (HCC)   Elevated troponin   Urothelial carcinoma of bladder (HCC)   Dehydration   Thrombocytopathia (HCC)   Chronic systolic heart failure (HCC)   Lower urinary tract infectious disease   Hyponatremia   Agitation   AKI (acute kidney injury) ([place])   Hypokalemia  CAP (community acquired pneumonia)  Acute encephalopathy with  Agitation/ Delirium In the setting of chronic dementia   - most likely multifactorial secondary to combination of  Infection  mild dehydration secondary to decreased by mouth intake,  polypharmacy as well as administration of THC prior to admit  - Continue IVF hydration as tolerated  - Continued on empiric azithromycin and cefepime as tolerated  - neurological exam noted to be non-focal at time of presentation    - no history of liver disease and ammonia noted to be unremarkable - Appreciate input by Psychiatry. Recently on increased zyprexa dose at 7.5mg  qhs with 2.5mg  PRN.  - Confused and agitated again overnight, resulting in patient pulling out crown. Continue on Saphris 5mg  bid per Psychiatry recommendations -Ordered and reviewed LFT and ammonia, all improved -mentation improved, still having day/nights reversed. Recommend keeping blinds open and to keep pt awake during the daytime -Per Psychiatry, will schedule PM dose of Saphris earlier in the evening  . HTN (hypertension) soft blood pressures noted on presentation -BP meds held at time of presentation -Remains stable currently  . CKD (chronic kidney disease) stage 3, GFR 30-59 ml/min (HCC)  -continue to avoid nephrotoxic agents -Improving renal function. Cr today around 1.6, seems to be around baseline -Will resume lasix albeit at lower dose at 40mg  daily  . Hydronephrosis due to obstructive malignant neoplasm of bladder [facility]) - -urology consulted. Appreciate input. -Recommendation for transition from nephroureteral catheters to antegrade stents in preparation  for transition to hospice -stent placement done on 2/17  . Urothelial carcinoma of bladder ([place]) -was in the process of getting hospice set up.   -Palliative Care continuing to follow, appreciate input  . Chronic systolic heart failure (HCC) avoid fluid overload given soft blood pressures -appears euvolemic  presently -Cr seems to be near baseline per above, will restart lasix, albeit at lower dose at 40mg  daily -repeat bmet in AM  . Hyperlipidemia LDL goal <70  -Noted to be chronic, presently stable  . Coronary artery disease of native heart with stable angina pectoris ([place]) chronic currently not endorsing any chest pain resume home medications when able to tolerate -remains stable at this time  . GERD stable continue home medications  . RLS (restless legs syndrome) stable continue home medications -stable at this time  . Elevated troponin chronic currently trending down no evidence of acute event on EKG.  Likely demand related of note patient did get tased -Stable at present    . Hyponatremia chronic  -Improved with hydration, improved  . Lower urinary tract infectious disease  -Pt noted to be febrile at time of presentation -Initially continued on rocephin empirically.  -Recently discussed with Dr. [name] regarding outpatient cutlure results, appreciate input. Pt noted to have Enterobacter cloacae species. Continued on cefepime according to culture results   CAP small infiltrate noted on chest x-ray, reviewed -Continue on azithromycin and cefepime as tolerated  . Thrombocytopathia ([place]) chronic currently at baseline -Remains stable  . Chronic anemia  -Hemodynamically stable -Pt noted to have refused blood tx at time of presentation  . Hypokalemia normalized. Repeat bmet in AM  Colostomy site with bulging colostomy.  Patient have been known to remove his colostomy bag and pulled on the ostomy wife is concerned that he may cause damage.  Admitting physician discussed with general surgery who at this point recommends applying abdominal binder not tightly but for the purposes of concealing the site so that the patient will be less likely to pull on it.  -Seen and evaluated by General Surgery 2/17. Stable at this time  DVT prophylaxis: SCD's Code Status: DNR Family  Communication: Pt in room, family at bedside Disposition Plan: Uncertain at this time  Consultants:   Urology  Palliative Care  General Surgery  Procedures:     Antimicrobials: Anti-infectives (From admission, onward)   Start     Dose/Rate Route Frequency Ordered Stop   02/12/19 1100  ceFEPIme (MAXIPIME) 2 g in sodium chloride 0.9 % 100 mL IVPB     2 g 200 mL/hr over 30 Minutes Intravenous Every 12 hours 02/12/19 0834     02/11/19 2200  azithromycin (ZITHROMAX) tablet 500 mg     500 mg Oral Daily at bedtime 02/11/19 0956     02/11/19 1200  fluconazole (DIFLUCAN) tablet 150 mg     150 mg Oral  Once 02/11/19 0959 02/11/19 1230   02/10/19 1700  azithromycin (ZITHROMAX) 500 mg in sodium chloride 0.9 % 250 mL IVPB  Status:  Discontinued     500 mg 250 mL/hr over 60 Minutes Intravenous Every 24 hours 02/10/19 1539 02/11/19 0956   02/10/19 1630  ceFEPIme (MAXIPIME) 2 g in sodium chloride 0.9 % 100 mL IVPB  Status:  Discontinued     2 g 200 mL/hr over 30 Minutes Intravenous Every 24 hours 02/10/19 1540 02/12/19 0834   02/09/19 1800  cefTRIAXone (ROCEPHIN) 1 g in sodium chloride 0.9 % 100 mL IVPB  Status:  Discontinued  1 g 200 mL/hr over 30 Minutes Intravenous Every 24 hours 02/08/19 2133 02/10/19 1537   02/08/19 1745  fluconazole (DIFLUCAN) tablet 150 mg  Status:  Discontinued     150 mg Oral  Once 02/08/19 1731 02/12/19 0841   02/08/19 1715  cefTRIAXone (ROCEPHIN) 1 g in sodium chloride 0.9 % 100 mL IVPB     1 g 200 mL/hr over 30 Minutes Intravenous  Once 02/08/19 1702 02/08/19 1855   02/08/19 1715  azithromycin (ZITHROMAX) 500 mg in sodium chloride 0.9 % 250 mL IVPB     500 mg 250 mL/hr over 60 Minutes Intravenous  Once 02/08/19 1702 02/08/19 2058      Subjective: Confused overnight and tired appearing this AM  Objective: Vitals:   02/13/19 2027 02/14/19 0447 02/14/19 1045 02/14/19 1201  BP: 108/70 111/78  111/78  Pulse: 94 89 (!) 104 95  Resp: 16 (!) 22  (!) 32    Temp: 97.8 F (36.6 C) 97.6 F (36.4 C)  98 F (36.7 C)  TempSrc: Oral Oral  Oral  SpO2: 95% 98% 95% 100%  Weight:      Height:        Intake/Output Summary (Last 24 hours) at 02/14/2019 1524 Last data filed at 02/14/2019 1403 Gross per 24 hour  Intake 1547.89 ml  Output 850 ml  Net 697.89 ml   Filed Weights   02/08/19 1444  Weight: 87.8 kg    Examination: General exam: Asleep, difficult to arouse this AM, laying in bed, in nad Respiratory system: Normal respiratory effort, no wheezing Cardiovascular system: regular rate, s1, s2 Gastrointestinal system: Soft, nondistended, positive BS Central nervous system: CN2-12 grossly intact, strength intact Extremities: Perfused, no clubbing Skin: Normal skin turgor, no notable skin lesions seen Psychiatry: difficult to assess as pt was difficult to arouse  Data Reviewed: I have personally reviewed following labs and imaging studies  CBC: Recent Labs  Lab 02/08/19 1610 02/09/19 0327 02/12/19 0337 02/13/19 0339 02/14/19 0828  WBC 5.9 5.3 6.4 6.1 8.4  NEUTROABS 4.6  --   --   --   --   HGB 7.1* 7.0* 7.5* 7.2* 8.0*  HCT 22.2* 21.3* 24.2* 23.7* 26.5*  MCV 99.6 100.9* 107.6* 107.7* 107.7*  PLT 50* 43* 35* 33* 33*   Basic Metabolic Panel: Recent Labs  Lab 02/08/19 2239 02/09/19 0327 02/11/19 0347 02/12/19 0337 02/13/19 0339 02/14/19 0828  NA  --  133* 136 137 137 137  K  --  3.3* 4.0 4.1 4.6 4.8  CL  --  100 106 106 109 109  CO2  --  21* 23 22 23  20*  GLUCOSE  --  92 98 110* 107* 111*  BUN  --  71* 40* 33* 31* 35*  CREATININE  --  2.49* 1.88* 1.67* 1.65* 1.61*  CALCIUM  --  8.8* 9.1 9.1 9.1 9.4  MG 3.0* 2.9*  --   --   --   --   PHOS 4.5 4.6  --   --   --   --    GFR: Estimated Creatinine Clearance: 38.8 mL/min (A) (by C-G formula based on SCr of 1.61 mg/dL (H)). Liver Function Tests: Recent Labs  Lab 02/08/19 1610 02/09/19 0327 02/13/19 1308 02/14/19 0828  AST 314* 251* 46* 43*  ALT 623* 548* 185* 163*   ALKPHOS 78 67 67 68  BILITOT 1.3* 1.0 1.5* 1.7*  PROT 6.5 6.0* 6.5 7.2  ALBUMIN 3.2* 3.1* 2.9* 3.3*   No results for input(s):  LIPASE, AMYLASE in the last 168 hours. Recent Labs  Lab 02/08/19 2239 02/13/19 1210  AMMONIA 37* 13   Coagulation Profile: Recent Labs  Lab 02/13/19 1308  INR 1.31   Cardiac Enzymes: Recent Labs  Lab 02/08/19 2239 02/09/19 0327  CKTOTAL 395  --   TROPONINI 0.06* 0.08*   BNP (last 3 results) No results for input(s): PROBNP in the last 8760 hours. HbA1C: No results for input(s): HGBA1C in the last 72 hours. CBG: No results for input(s): GLUCAP in the last 168 hours. Lipid Profile: No results for input(s): CHOL, HDL, LDLCALC, TRIG, CHOLHDL, LDLDIRECT in the last 72 hours. Thyroid Function Tests: No results for input(s): TSH, T4TOTAL, FREET4, T3FREE, THYROIDAB in the last 72 hours. Anemia Panel: No results for input(s): VITAMINB12, FOLATE, FERRITIN, TIBC, IRON, RETICCTPCT in the last 72 hours. Sepsis Labs: Recent Labs  Lab 02/08/19 2239  LATICACIDVEN 1.5    Recent Results (from the past 240 hour(s))  Urine culture     Status: Abnormal   Collection Time: 02/08/19  3:57 PM  Result Value Ref Range Status   Specimen Description   Final    URINE, RANDOM Performed at [facility] [address], [place], [place] [address]    Special Requests   Final    NONE Performed at [facility], [place] [address] [place], [place] [address]    Culture 50,000 COLONIES/mL YEAST (A)  Final   Report Status 02/10/2019 FINAL  Final  Blood culture (routine x 2)     Status: None   Collection Time: 02/08/19 10:39 PM  Result Value Ref Range Status   Specimen Description   Final    BLOOD RIGHT ARM Performed at [facility] [address] [place], [place] [address]    Special Requests   Final    BOTTLES DRAWN AEROBIC ONLY Blood Culture adequate volume Performed at [facility] [address] [place], [place] [address]    Culture   Final    NO GROWTH 5 DAYS Performed at [facility], [place] [address], [place], [place] [address]    Report Status 02/14/2019 FINAL  Final  Blood culture (routine x 2)     Status: None   Collection Time: 02/08/19 10:39 PM  Result Value Ref Range Status   Specimen Description   Final    BLOOD LEFT HAND Performed at [facility] [address], [place], [place] [address]    Special Requests   Final    BOTTLES DRAWN AEROBIC AND ANAEROBIC Blood Culture adequate volume Performed at [facility] [address] [place],  [address]    Culture   Final    NO GROWTH 5 DAYS Performed at [facility], [place] [address], [place],  [address]    Report Status 02/14/2019 FINAL  Final  MRSA PCR Screening     Status: Abnormal   Collection Time: 02/09/19  4:00 PM  Result Value Ref Range Status   MRSA by PCR POSITIVE (A) NEGATIVE Final    Comment:        The GeneXpert MRSA Assay (FDA approved for NASAL specimens only), is one component of a comprehensive MRSA colonization surveillance program. It is not intended to diagnose MRSA infection nor to guide or monitor treatment for MRSA infections. RESULT CALLED TO, READ BACK BY AND VERIFIED WITH: K.DUNKELBERGER AT 1911 ON 02/09/19 BY N.THOMPSON Performed at [facility], [place]  [address] [place], [place] [address]      Radiology Studies: Ir Ureteral Stent Placement Existing Access Left  Result Date: 02/13/2019 INDICATION: Bilateral nephroureteral catheters are in place for bilateral ureteral obstruction. The patient underwent a capping trial without difficulty and stabilization of the creatinine. He is cleared for conversion to bilateral ureteral stents. EXAM: BILATERAL ANTEGRADE DOUBLE-J URETERAL STENT PLACEMENT COMPARISON:  None. MEDICATIONS: None ANESTHESIA/SEDATION: Fentanyl 50 mcg IV; Versed 2 mg IV Moderate  Sedation Time:  30 minutes The patient was continuously monitored during the procedure by the interventional radiology nurse under my direct supervision. CONTRAST:  10 cc Isovue-300-administered into the collecting system(s) FLUOROSCOPY TIME:  Fluoroscopy Time: 2 minutes 18 seconds (18 mGy). COMPLICATIONS: None immediate. PROCEDURE: Informed written consent was obtained from the patient after a thorough discussion of the procedural risks, benefits and alternatives. All questions were addressed. Maximal Sterile Barrier Technique was utilized including caps, mask, sterile gowns, sterile gloves, sterile drape, hand hygiene and skin antiseptic. A timeout was performed prior to the initiation of the procedure. The back was prepped and draped in a sterile fashion. The existing left nephroureteral catheter was removed over an Amplatz wire. A 24 cm 10 French antegrade ureteral stent was advanced over the Amplatz wire and deployed. The proximal end is coiled in the left renal pelvis in the distal end is coiled in the bladder. The identical procedure was performed for the right nephroureteral catheter utilizing a 22 cm 10 French stent. FINDINGS: Images document exchange of the bilateral nephroureteral catheter is for bilateral antegrade double-J ureteral stents. Tips are coiled in the renal pelvises and bladder respectively. IMPRESSION: Successful conversion of bilateral nephroureteral catheters for bilateral double-J ureteral stents. Electronically Signed   By: Marybelle Killings M.D.   On: 02/13/2019 17:19   Ir Ureteral Stent Placement Existing Access Right  Result Date: 02/13/2019 INDICATION: Bilateral nephroureteral catheters are in place for bilateral ureteral obstruction. The patient underwent a capping trial without difficulty and stabilization of the creatinine. He is cleared for conversion to bilateral ureteral stents. EXAM: BILATERAL ANTEGRADE DOUBLE-J URETERAL STENT PLACEMENT COMPARISON:  None. MEDICATIONS: None  ANESTHESIA/SEDATION: Fentanyl 50 mcg IV; Versed 2 mg IV Moderate Sedation Time:  30 minutes The patient was continuously monitored during the procedure by the interventional radiology nurse under my direct supervision. CONTRAST:  10 cc Isovue-300-administered into the collecting system(s) FLUOROSCOPY TIME:  Fluoroscopy Time: 2 minutes 18 seconds (18 mGy). COMPLICATIONS: None immediate. PROCEDURE: Informed written consent was obtained from the patient after a thorough discussion of the procedural risks, benefits and alternatives. All questions were addressed. Maximal Sterile Barrier Technique was utilized including caps, mask, sterile gowns, sterile gloves, sterile drape, hand hygiene and skin antiseptic. A timeout was performed prior to the initiation of the procedure. The back was prepped and draped in a sterile fashion. The existing left nephroureteral catheter was removed over an Amplatz wire. A 24 cm 10 French antegrade ureteral stent was advanced over the Amplatz wire and deployed. The proximal end is coiled in the left renal pelvis in the distal end is coiled in the bladder. The identical procedure was performed for the right nephroureteral catheter utilizing a 22 cm 10 French stent. FINDINGS: Images document exchange of the bilateral nephroureteral catheter is for bilateral antegrade double-J ureteral stents. Tips are coiled in the renal pelvises and bladder respectively. IMPRESSION: Successful conversion of bilateral nephroureteral catheters for bilateral double-J ureteral stents. Electronically Signed   By: Marybelle Killings M.D.   On: 02/13/2019 17:19  Scheduled Meds: . Asenapine Maleate  5 mg Sublingual BID  . azithromycin  500 mg Oral QHS  . famotidine  20 mg Oral BID  . feeding supplement (ENSURE ENLIVE)  237 mL Oral BID BM  . mirabegron ER  25 mg Oral Daily  . multivitamin with minerals  1 tablet Oral Daily  . PARoxetine  10 mg Oral Daily  . pramipexole  2.5 mg Oral BID  . sodium chloride  flush  5 mL Intracatheter Q8H   Continuous Infusions: . sodium chloride Stopped (02/14/19 0700)  . ceFEPime (MAXIPIME) IV Stopped (02/14/19 1308)     LOS: 5 days   Marylu Lund, MD [facility] Pager On Amion  If 7PM-7AM, please contact night-coverage 02/14/2019, 3:24 PM

## 2019-02-14 NOTE — Progress Notes (Signed)
OT Cancellation Note  Patient Details Name: ZYIR GASSERT MRN: [id number] DOB: 1935/06/12   Cancelled Treatment:    Reason Eval/Treat Not Completed: Other (comment)  Spoke with family regarding role of OT. Pts wife will A with ADL activity as needed. No OT needs identified.  Kari Baars, OT Acute Rehabilitation Services [phone] Office- [phone], Edwena Felty D 02/14/2019, 12:45 PM

## 2019-02-15 ENCOUNTER — Inpatient Hospital Stay (HOSPITAL_COMMUNITY): Payer: Medicare HMO

## 2019-02-15 DIAGNOSIS — Z7189 Other specified counseling: Secondary | ICD-10-CM

## 2019-02-15 DIAGNOSIS — Z515 Encounter for palliative care: Secondary | ICD-10-CM

## 2019-02-15 LAB — CBC
HCT: 26.3 % — ABNORMAL LOW (ref 39.0–52.0)
Hemoglobin: 7.9 g/dL — ABNORMAL LOW (ref 13.0–17.0)
MCH: 32.4 pg (ref 26.0–34.0)
MCHC: 30 g/dL (ref 30.0–36.0)
MCV: 107.8 fL — ABNORMAL HIGH (ref 80.0–100.0)
Platelets: 32 10*3/uL — ABNORMAL LOW (ref 150–400)
RBC: 2.44 MIL/uL — AB (ref 4.22–5.81)
RDW: 23.4 % — ABNORMAL HIGH (ref 11.5–15.5)
WBC: 9.1 10*3/uL (ref 4.0–10.5)
nRBC: 0 % (ref 0.0–0.2)

## 2019-02-15 LAB — ALBUMIN, PLEURAL OR PERITONEAL FLUID: ALBUMIN FL: 1.2 g/dL

## 2019-02-15 LAB — COMPREHENSIVE METABOLIC PANEL
ALT: 129 U/L — ABNORMAL HIGH (ref 0–44)
ANION GAP: 7 (ref 5–15)
AST: 36 U/L (ref 15–41)
Albumin: 2.9 g/dL — ABNORMAL LOW (ref 3.5–5.0)
Alkaline Phosphatase: 63 U/L (ref 38–126)
BUN: 38 mg/dL — ABNORMAL HIGH (ref 8–23)
CO2: 21 mmol/L — ABNORMAL LOW (ref 22–32)
Calcium: 9.3 mg/dL (ref 8.9–10.3)
Chloride: 109 mmol/L (ref 98–111)
Creatinine, Ser: 1.76 mg/dL — ABNORMAL HIGH (ref 0.61–1.24)
GFR calc Af Amer: 41 mL/min — ABNORMAL LOW (ref >60)
GFR calc non Af Amer: 35 mL/min — ABNORMAL LOW (ref >60)
GLUCOSE: 103 mg/dL — AB (ref 70–99)
Potassium: 4.6 mmol/L (ref 3.5–5.1)
Sodium: 137 mmol/L (ref 135–145)
Total Bilirubin: 1.4 mg/dL — ABNORMAL HIGH (ref 0.3–1.2)
Total Protein: 6.4 g/dL — ABNORMAL LOW (ref 6.5–8.1)

## 2019-02-15 LAB — GLUCOSE, PLEURAL OR PERITONEAL FLUID: Glucose, Fluid: 116 mg/dL

## 2019-02-15 LAB — BODY FLUID CELL COUNT WITH DIFFERENTIAL
EOS FL: 0 %
Lymphs, Fluid: 53 %
MONOCYTE-MACROPHAGE-SEROUS FLUID: 47 % — AB (ref 50–90)
Neutrophil Count, Fluid: 0 % (ref 0–25)
WBC FLUID: 1452 uL — AB (ref 0–1000)

## 2019-02-15 LAB — LACTATE DEHYDROGENASE, PLEURAL OR PERITONEAL FLUID: LD, Fluid: 182 U/L — ABNORMAL HIGH (ref 3–23)

## 2019-02-15 LAB — BRAIN NATRIURETIC PEPTIDE: B Natriuretic Peptide: 2967.6 pg/mL — ABNORMAL HIGH (ref 0.0–100.0)

## 2019-02-15 MED ORDER — LIDOCAINE HCL 1 % IJ SOLN
INTRAMUSCULAR | Status: AC
  Filled 2019-02-15: qty 10

## 2019-02-15 MED ORDER — FLUCONAZOLE 100MG IVPB
100.0000 mg | INTRAVENOUS | Status: DC
  Administered 2019-02-15 – 2019-02-16 (×2): 100 mg via INTRAVENOUS
  Filled 2019-02-15 (×2): qty 50

## 2019-02-15 MED ORDER — FUROSEMIDE 10 MG/ML IJ SOLN
40.0000 mg | Freq: Two times a day (BID) | INTRAMUSCULAR | Status: DC
  Administered 2019-02-15 – 2019-02-16 (×2): 40 mg via INTRAVENOUS
  Filled 2019-02-15 (×2): qty 4

## 2019-02-15 NOTE — Clinical Social Work Note (Signed)
Clinical Social Work Assessment  Patient Details  Name: Charles Coffey MRN: [id number] Date of Birth: 1935-01-20  Date of referral:  02/15/19               Reason for consult:  Discharge Planning                Permission sought to share information with:  Facility Sport and exercise psychologist, Psychiatrist, PCP, Family Supports, Case Manager Permission granted to share information::     Name::       [address]::     Relationship::  Spouse   Contact Information:    [phone]   Housing/Transportation Living arrangements for the past 2 months:  Single Family Home Source of Information:  Adult Children, Spouse Patient Interpreter Needed:  None Criminal Activity/Legal Involvement Pertinent to Current Situation/Hospitalization:  No - Comment as needed Significant Relationships:  Adult Children, Spouse Lives with:  Spouse Do you feel safe going back to the place where you live?  Yes Need for family participation in patient care:  Yes   Care giving concerns:   Charles Coffey is a 84 y.o. male with medical history significant of CAD s/p CABG, GERD,metastatic bladder cancer status post suprapubic catheter, nephrostomy tubes, ostomy, dementia with  sundowning. Presented with agitation and homoccidal ideations.   Palliative care team is following, patient spouse prefers patient to return home with hospice services.  Psychiatrist is following for medication management  CSW consulted to talk with the patient family about SNF placement options.   Social Worker assessment / plan: CSW met with the patient, spouse and son at bedside. CSW informed patient family the patient is  not appropriate for SNF placement at this time. The patient is still having paranoid behaviors and still requires a Actuary.  CSW reached out to SNF's with memory care per family request, SNF's will not accept due to behaviors at this. CSW actively listend to patient son and spouse concerns. CSW  provided emotional support. Patient spouse reports she ultimately desires to take the patient home with hospice services following. She understands the patient will need to be medically stable. She reports she has reached out to private duty agencies and local programs that can assist her with his care at home when he is able to return.   Plan: to be determine.   Employment status:  Retired Nurse, adult PT Recommendations:  Not assessed at this time Information / Referral to community resources:  [place]  Patient/Family's Response to care: Patient spouse express she wants the patient stabilize enough to transition home with hospice.   Patient/Family's Understanding of and Emotional Response to Diagnosis, Current Treatment, and Prognosis:  Patient spouse and son express "we want what's best for him."  The patient spouse has been proactive with calling agencies that can assist with the patient care.   Emotional Assessment Appearance:  Appears stated age Attitude/Demeanor/Rapport:    Affect (typically observed):  Calm Orientation:  Oriented to Self Alcohol / Substance use:  Not Applicable Psych involvement (Current and /or in the community):  No (Comment)  Discharge Needs  Concerns to be addressed:  Home Safety Concerns, Discharge Planning Concerns Readmission within the last 30 days:  No Current discharge risk:  Psychiatric Illness Barriers to Discharge:  Continued Medical Work up, No SNF bed, Requiring sitter, Charles Hopping, LCSW 02/15/2019, 2:10 PM

## 2019-02-15 NOTE — Progress Notes (Signed)
Pharmacy Antibiotic Note  Charles Coffey is a 84 y.o. male admitted on 02/08/2019 with advanced urothelial carcinoma with bilateral ureteral and bladder outlet obstruction.  Bilateral JJ stents placed by IR 2/17.   Marland Kitchen  Pharmacy has been consulted for Fluconazole dosing, UCx 2/12 >> 50,000 colonies yeast, finalized 2/14, TRH MD discussed with ID.  Plan: Fluconazole 100mg  IVPB q24  Height: 5\' 8"  (172.7 cm) Weight: 193 lb 9 oz (87.8 kg) IBW/kg (Calculated) : 68.4  Temp (24hrs), Avg:97.8 F (36.6 C), Min:97.5 F (36.4 C), Max:98.1 F (36.7 C)  Recent Labs  Lab 02/08/19 2239  02/09/19 0327 02/11/19 0347 02/12/19 0337 02/13/19 0339 02/14/19 0828 02/15/19 0356  WBC  --   --  5.3  --  6.4 6.1 8.4 9.1  CREATININE  --    < > 2.49* 1.88* 1.67* 1.65* 1.61* 1.76*  LATICACIDVEN 1.5  --   --   --   --   --   --   --    < > = values in this interval not displayed.    Estimated Creatinine Clearance: 35.5 mL/min (A) (by C-G formula based on SCr of 1.76 mg/dL (H)).    Allergies  Allergen Reactions  . Statins Other (See Comments)    liver effects  . Cyproheptadine Other (See Comments)    Unable to urinate   Antimicrobials this admission:  2/12 CTX>>2/13 2/14 cefepime>> 2/12 azith>> 2/12 fluconazole 150 x 1 dose ordered but charted NOT given pt too drowsy, re-order 2/15 x 1 dose 2/19 Fluconazole >>  Dose adjustments this admission:  2/15 azith to PO 2/16 cefepime 2 g IV q24 --> 2 g IV q12h  Microbiology results:  PTA UCx: enterobacter cloacae (S cefepime per discussion w/Chui. Other S not documented). Cx from [facility]  2/12 MRSA PCR: pos 2/12 BCx2: neg FINA: 2/12 Ucx: 50 K yeast- final 2/13 MRSA PCR: positive 2/19 Pleural fluid: AFB, fluic Cx sent  Minda Ditto PharmD Pager [phone] 02/15/2019, 5:23 PM

## 2019-02-15 NOTE — Care Management Note (Signed)
Case Management Note  Patient Details  Name: Charles Coffey MRN: [id number] Date of Birth: Feb 14, 1935  Subjective/Objective:                    Action/Plan: At present time pt is not safe for discharge. Pt has sundown at night. Pt has acute psychiatric, self harm (pulled his tooth out), trying to remove his ostomy appliance, Wife want to take pt home with Hospice when stable and private duty.    Expected Discharge Date:  02/11/19               Expected Discharge Plan:  [facility]  In-House Referral:     Discharge planning Services  CM Consult  Post Acute Care Choice:    Choice offered to:  Spouse  DME Arranged:    DME Agency:     HH Arranged:    HH Agency:  [facility]  Status of Service:  In process, will continue to follow  If discussed at Long Length of Stay Meetings, dates discussed:    Additional CommentsPurcell Mouton, RN 02/15/2019, 12:51 PM

## 2019-02-15 NOTE — Progress Notes (Signed)
Palliative care progress note  Reason for consult: Goals of care in light of his metastatic cancer  I met today with patient, his wife, and step-son.  Discussed with bedside RN, also with [facility] cabin crew. Also discussed with care management and social work.   Chart review reveals that he was confused/threatening last night, and this morning, the patient is confused and talks about how people were out to get him last night, how ever, they decided t let him go. He is not agitated, how ever, he is quite confused. PO intake is reasonable. He continues to demonstrate a lot of paranoid behavior.   His wife is planning on transition home with hospice services if he is medically ready for discharge, but we discussed need to ensure safety and that his agitation is managed. She has severe caregiver stress, she is tearful. She is asking for list of private duty sitters she can hire. She remains torn about whether or not she will be able to handle him at home.   BP (!) 141/87 (BP Location: Right Arm)   Pulse (!) 110   Temp (!) 97.5 F (36.4 C) (Oral)   Resp 20   Ht '5\' 8"'$  (1.727 m)   Wt 87.8 kg   SpO2 99%   BMI 29.43 kg/m  Labs and imaging noted  Awake alert Confused No distress Regular breathing No edema Non focal  - Dilaudid as needed. - His wife is interested in hospice services at home, but there remains concern about safety when he transitions home.  His stepson has locked up or removed all of his firearms and knives in the home.    Ongoing trials of appropriate medications is underway. Repeat Psych eval recommendations noted. Family now also willing to explore if patient will benefit from inpatient psych.    Patient's stepson states that he is concerned about a safe disposition, in addition, he is also very concerned about being as fiscally responsible as possible, the patient's wife has some limited amount of resources available to her and they want to be as cautious as possible.   For  now, continue to monitor behaviors and continue overall goals of care discussions.    - PMT to continue to follow and reassess goals of care.   Total time: 35 minutes Greater than 50%  of this time was spent counseling and coordinating care related to the above assessment and plan.  Loistine Chance, MD [facility] [phone]

## 2019-02-15 NOTE — Plan of Care (Signed)
  Problem: Education: Goal: Knowledge of General Education information will improve Description Including pain rating scale, medication(s)/side effects and non-pharmacologic comfort measures Outcome: Progressing   Problem: Coping: Goal: Level of anxiety will decrease Outcome: Not Progressing   Problem: Safety: Goal: Ability to remain free from injury will improve Outcome: Not Progressing   Patient continues to have delusions during the night and anxiety

## 2019-02-15 NOTE — Telephone Encounter (Signed)
Message left on pharmacy voicemail contains order information as submitted 02-13-2019.

## 2019-02-15 NOTE — Progress Notes (Addendum)
PROGRESS NOTE  Charles Coffey EVO:[id number] DOB: June 15, 1935 DOA: 02/08/2019 PCP: [name], MD  HPI/Recap of past 24 hours: 84 y.o.malewith medical history significant of CAD s/p CABG, GERD,metastatic bladder cancer status post suprapubic catheter, nephrostomy tube, mild dementia with sundowning, presented with agitation and homoccidal ideations. Patient recently started Bactrim for UTI.Early in the week she was treated with Augmentin after was found to be febrile up to 102 he was seen by urology and urine sample was taken. He continued to be febrile Monday Tuesday and Wednesday. Finally has improved 1 week ago. He has been having worsening confusion while febrile. Decreased urinary output. He actually stopped using his Lasix in the past few days. Patient has been having sundowning and was seen by oncologist who prescribedHaldol 1 mg instructed to take 1-2 before bedtime. Yesterdayseemed to help with agitation. He has been getting gradually weaker has been having some shortness of breath Familygiven [employer] small THC cookiehe slept all day and became very agitated. Chasing them with knives. Trying to take out his ostomy bag not understanding why its there.Police was then called and patient was tased by the police.  02/15/19: Seen and examined at his bedside. Denies suicidal ideation. He is alert and oriented x 2. Has a sitter at his bedside for his own safety. States he feels like he can't breathe.  Denies chest pain.  Repeat cxr done today 12/16/2019 revealed worsening pulmonary edema and R pleural effusion. Will obtain BNP and possibly 2D echo if significantly elevated.  Assessment/Plan: Principal Problem:   Delirium Active Problems:   Hyperlipidemia LDL goal <70   HTN (hypertension)   Coronary artery disease of native heart with stable angina pectoris (HCC)   GERD   RLS (restless legs syndrome)   Chronic anemia   CKD (chronic kidney disease) stage 3, GFR 30-59  ml/min (HCC)   Hydronephrosis due to obstructive malignant neoplasm of bladder (HCC)   Elevated troponin   Urothelial carcinoma of bladder (HCC)   Dehydration   Thrombocytopathia (HCC)   Chronic systolic heart failure (HCC)   Lower urinary tract infectious disease   Hyponatremia   Agitation   AKI (acute kidney injury) ([place])   Hypokalemia   CAP (community acquired pneumonia)  Acute encephalopathy withAgitation/Delirium In the setting of chronic dementia- most likely multifactorial secondary to combination of Infection mild dehydration secondary to decreased by mouth intake, polypharmacyas well as administration of THC prior to admit - CT head done on 02/08/2019 unremarkable for any acute findings - Continue IVF hydration as tolerated - Continued on empiric azithromycin and cefepime as tolerated - neurological exam noted to be non-focal at time of presentation - no history of liver disease and ammonia noted to be unremarkable - Appreciate input by Psychiatry. Recently on increased zyprexa dose at 7.5mg  qhs with 2.5mg  PRN.  - Confused and agitated again overnight, resulting in patient pulling out crown. Continue on Saphris 5mg  bid per Psychiatry recommendations -Ordered and reviewed LFT and ammonia, all improved -mentation improved, still having day/nights reversed. Recommend keeping blinds open and to keep pt awake during the daytime -Per Psychiatry, will schedule PM dose of Saphris earlier in the evening -Continue one-to-one sitter for patient's own safety  Acute on chronic systolic CHF Independently reviewed chest x-ray done on 02/15/2019, which showed worsening pulmonary edema with right pleural effusion Obtain BNP Obtain 2D echo if BNP is significantly elevated Last 2D echo done on 08/21/2018 revealed LVEF 25 to 30% Start oral Lasix 40 mg daily Start IV  Lasix 40 mg twice daily Closely monitor urine output, electrolytes and renal function  Bilateral pleural effusion  worse on the right Right thoracentesis by IR Fluid to be sent for analysis  Candidiuria Urine cx grew 50,000 yeast Suspect colonization Negative blood cx x2, no growth 5 days No sign of leukopenia or neutropenia Has suprapubic cath in place Obtain cbc w differentials in the am Will treat empirically  Start IV fluconazole Dc IV cefepime  Resolving AKI on CKD 3 Presented with creatinine of 2.49 Appears to be at his baseline with creatinine of 1.76 and GFR of exam of 1 nephrotoxic agents/hypotension Repeat BMP in the morning Monitor urine output  Hyperbilirubinemia Unclear etiology T bili 1.4 from 1.7 ALT trending down from 1 85-1 63-1 29  Chronic normocytic anemia Hemoglobin trending down from 8.0-7.9 Maintain hemoglobin above 0 No sign of overt bleeding  Chronic thrombocytopenia, unclear etiology Platelet trending down 30 3K to 30 2K Repeat CBC in the morning  . HTN (hypertension)soft blood pressures noted on presentation -BP meds held at time of presentation -Remains stable currently  . CKD (chronic kidney disease) stage 3, GFR 30-59 ml/min (HCC) -continue to avoid nephrotoxic agents -Improving renal function. Cr today around 1.6, seems to be around baseline -Will resume lasix albeit at lower dose at 40mg  daily  . Hydronephrosis due to obstructive malignant neoplasm of bladder [facility])- -urology consulted. Appreciate input. -Recommendation for transition from nephroureteral catheters to antegrade stents in preparation for transition to hospice -stent placement done on 2/17  . Urothelial carcinoma of bladder (HCC)-was in the process of getting hospice set up.  -Palliative Care continuing to follow, appreciate input  . Chronic systolic heart failure (HCC)avoid fluid overload given soft blood pressures -appears euvolemic presently -Cr seems to be near baseline per above, will restart lasix, albeit at lower dose at 40mg  daily -repeat bmet in AM  .  Hyperlipidemia LDL goal <70 -Noted to be chronic, presently stable  . Coronary artery disease of native heart with stable angina pectoris (HCC)chronic currently not endorsing any chest pain resume home medications when able to tolerate -remains stable at this time  . GERDstable continue home medications  . RLS (restless legs syndrome)stable continue home medications -stable at this time  . Elevated troponinchronic currently trending down no evidence of acute event on EKG. Likely demand related of note patient did get tased -Stable at present  . Hyponatremiachronic  -Improved with hydration, improved  . Lower urinary tract infectious disease -Pt noted to be febrile at time of presentation -Initially continued on rocephin empirically.  -Recently discussed with Dr. [name] regarding outpatient cutlure results, appreciate input. Pt noted to have Enterobacter cloacae species. Continued on cefepime according to culture results  CAP small infiltrate noted on chest x-ray, reviewed -Continue on azithromycin and cefepime as tolerated  . Chronic anemia -Hemodynamically stable -Pt noted to have refused blood tx at time of presentation  . Hypokalemia normalized. Repeat bmet in AM  Colostomy site with bulging colostomy. Patient have been known to remove his colostomy bag and pulled on the ostomy wife is concerned that he may cause damage. Admitting physician discussed with general surgery who at this point recommends applying abdominal binder not tightly but for the purposes of concealing the site so that the patient will be less likely to pull on it. -Seen and evaluated by General Surgery 2/17. Stable at this time  DVT prophylaxis: SCD's Code Status: DNR Family Communication:  None at bedside Disposition Plan:  Home when clinically  stable  Consultants:   Urology  Palliative Care  General Surgery  Procedures:     Antimicrobials:              Anti-infectives (From admission, onward)     Start        Dose/Rate  Route  Frequency  Ordered  Stop     02/12/19 1100    ceFEPIme (MAXIPIME) 2 g in sodium chloride 0.9 % 100 mL IVPB       2 g  200 mL/hr over 30 Minutes  Intravenous  Every 12 hours  02/12/19 0834        02/11/19 2200    azithromycin (ZITHROMAX) tablet 500 mg       500 mg  Oral  Daily at bedtime  02/11/19 0956        02/11/19 1200    fluconazole (DIFLUCAN) tablet 150 mg       150 mg  Oral   Once  02/11/19 0959  02/11/19 1230     02/10/19 1700    azithromycin (ZITHROMAX) 500 mg in sodium chloride 0.9 % 250 mL IVPB  Status:  Discontinued       500 mg  250 mL/hr over 60 Minutes  Intravenous  Every 24 hours  02/10/19 1539  02/11/19 0956     02/10/19 1630    ceFEPIme (MAXIPIME) 2 g in sodium chloride 0.9 % 100 mL IVPB  Status:  Discontinued       2 g  200 mL/hr over 30 Minutes  Intravenous  Every 24 hours  02/10/19 1540  02/12/19 0834     02/09/19 1800    cefTRIAXone (ROCEPHIN) 1 g in sodium chloride 0.9 % 100 mL IVPB  Status:  Discontinued       1 g  200 mL/hr over 30 Minutes  Intravenous  Every 24 hours  02/08/19 2133  02/10/19 1537     02/08/19 1745    fluconazole (DIFLUCAN) tablet 150 mg  Status:  Discontinued       150 mg  Oral   Once  02/08/19 1731  02/12/19 0841     02/08/19 1715    cefTRIAXone (ROCEPHIN) 1 g in sodium chloride 0.9 % 100 mL IVPB       1 g  200 mL/hr over 30 Minutes  Intravenous   Once  02/08/19 1702  02/08/19 1855     02/08/19 1715    azithromycin (ZITHROMAX) 500 mg in sodium chloride 0.9 % 250 mL IVPB       500 mg  250 mL/hr over 60 Minutes  Intravenous   Once  02/08/19 1702  02/08/19 2058         Objective: Vitals:   02/14/19 1045 02/14/19 1201 02/14/19 2137 02/15/19 0607  BP:  111/78 110/63 (!) 141/87  Pulse: (!) 104 95 (!) 103 (!) 110  Resp:  (!) 32 18 20  Temp:   98 F (36.7 C) 98.1 F (36.7 C) (!) 97.5 F (36.4 C)  TempSrc:  Oral Oral Oral  SpO2: 95% 100% 98% 99%  Weight:      Height:        Intake/Output Summary (Last 24 hours) at 02/15/2019 1301 Last data filed at 02/15/2019 7654 Gross per 24 hour  Intake 1628.78 ml  Output 1575 ml  Net 53.78 ml   Filed Weights   02/08/19 1444  Weight: 87.8 kg    Exam:  . General: 84 y.o. year-old male well developed well nourished in no acute  distress.  Alert and oriented x3. . Cardiovascular: Regular rate and rhythm with no rubs or gallops.  No thyromegaly or JVD noted.   Marland Kitchen Respiratory: Diffuse rales bilaterally.  Poor inspiratory effort. . Abdomen: Soft nontender nondistended with normal bowel sounds x4 quadrants. . Musculoskeletal: 1+ pitting edema in lower extremities bilaterally. 2/4 pulses in all 4 extremities. Marland Kitchen Psychiatry: Mood is appropriate for condition and setting   Data Reviewed: CBC: Recent Labs  Lab 02/08/19 1610 02/09/19 0327 02/12/19 0337 02/13/19 0339 02/14/19 0828 02/15/19 0356  WBC 5.9 5.3 6.4 6.1 8.4 9.1  NEUTROABS 4.6  --   --   --   --   --   HGB 7.1* 7.0* 7.5* 7.2* 8.0* 7.9*  HCT 22.2* 21.3* 24.2* 23.7* 26.5* 26.3*  MCV 99.6 100.9* 107.6* 107.7* 107.7* 107.8*  PLT 50* 43* 35* 33* 33* 32*   Basic Metabolic Panel: Recent Labs  Lab 02/08/19 2239 02/09/19 0327 02/11/19 0347 02/12/19 0337 02/13/19 0339 02/14/19 0828 02/15/19 0356  NA  --  133* 136 137 137 137 137  K  --  3.3* 4.0 4.1 4.6 4.8 4.6  CL  --  100 106 106 109 109 109  CO2  --  21* 23 22 23  20* 21*  GLUCOSE  --  92 98 110* 107* 111* 103*  BUN  --  71* 40* 33* 31* 35* 38*  CREATININE  --  2.49* 1.88* 1.67* 1.65* 1.61* 1.76*  CALCIUM  --  8.8* 9.1 9.1 9.1 9.4 9.3  MG 3.0* 2.9*  --   --   --   --   --   PHOS 4.5 4.6  --   --   --   --   --    GFR: Estimated Creatinine Clearance: 35.5 mL/min (A) (by C-G formula based on SCr of 1.76 mg/dL (H)). Liver Function Tests: Recent Labs  Lab  02/08/19 1610 02/09/19 0327 02/13/19 1308 02/14/19 0828 02/15/19 0356  AST 314* 251* 46* 43* 36  ALT 623* 548* 185* 163* 129*  ALKPHOS 78 67 67 68 63  BILITOT 1.3* 1.0 1.5* 1.7* 1.4*  PROT 6.5 6.0* 6.5 7.2 6.4*  ALBUMIN 3.2* 3.1* 2.9* 3.3* 2.9*   No results for input(s): LIPASE, AMYLASE in the last 168 hours. Recent Labs  Lab 02/08/19 2239 02/13/19 1210  AMMONIA 37* 13   Coagulation Profile: Recent Labs  Lab 02/13/19 1308  INR 1.31   Cardiac Enzymes: Recent Labs  Lab 02/08/19 2239 02/09/19 0327  CKTOTAL 395  --   TROPONINI 0.06* 0.08*   BNP (last 3 results) No results for input(s): PROBNP in the last 8760 hours. HbA1C: No results for input(s): HGBA1C in the last 72 hours. CBG: No results for input(s): GLUCAP in the last 168 hours. Lipid Profile: No results for input(s): CHOL, HDL, LDLCALC, TRIG, CHOLHDL, LDLDIRECT in the last 72 hours. Thyroid Function Tests: No results for input(s): TSH, T4TOTAL, FREET4, T3FREE, THYROIDAB in the last 72 hours. Anemia Panel: No results for input(s): VITAMINB12, FOLATE, FERRITIN, TIBC, IRON, RETICCTPCT in the last 72 hours. Urine analysis:    Component Value Date/Time   COLORURINE YELLOW 02/08/2019 1557   APPEARANCEUR CLOUDY (A) 02/08/2019 1557   LABSPEC 1.015 02/08/2019 1557   PHURINE 5.0 02/08/2019 1557   GLUCOSEU NEGATIVE 02/08/2019 1557   HGBUR LARGE (A) 02/08/2019 1557   HGBUR negative 10/15/2010 1251   BILIRUBINUR NEGATIVE 02/08/2019 1557   BILIRUBINUR n 12/01/2016 1521   KETONESUR NEGATIVE 02/08/2019 1557   PROTEINUR 30 (A)  02/08/2019 1557   UROBILINOGEN 0.2 12/01/2016 1521   UROBILINOGEN 0.2 10/15/2010 1251   NITRITE NEGATIVE 02/08/2019 1557   LEUKOCYTESUR LARGE (A) 02/08/2019 1557   Sepsis Labs: @LABRCNTIP (procalcitonin:4,lacticidven:4)  ) Recent Results (from the past 240 hour(s))  Urine culture     Status: Abnormal   Collection Time: 02/08/19  3:57 PM  Result Value Ref Range Status   Specimen  Description   Final    URINE, RANDOM Performed at [facility] [address] [place], [place] [address]    Special Requests   Final    NONE Performed at [facility], [place] [address] [place], [place] [address]    Culture 50,000 COLONIES/mL YEAST (A)  Final   Report Status 02/10/2019 FINAL  Final  Blood culture (routine x 2)     Status: None   Collection Time: 02/08/19 10:39 PM  Result Value Ref Range Status   Specimen Description   Final    BLOOD RIGHT ARM Performed at [facility] [address] [place], [place] [address]    Special Requests   Final    BOTTLES DRAWN AEROBIC ONLY Blood Culture adequate volume Performed at [facility] [address], [place], [place] [address]    Culture   Final    NO GROWTH 5 DAYS Performed at [facility], [place] [address] [place], [place] [address]    Report Status 02/14/2019 FINAL  Final  Blood culture (routine x 2)     Status: None   Collection Time: 02/08/19 10:39 PM  Result Value Ref Range Status   Specimen Description   Final    BLOOD LEFT HAND Performed at [facility] [address] [place], [place] [address]    Special Requests   Final    BOTTLES DRAWN AEROBIC AND ANAEROBIC Blood Culture adequate volume Performed at [facility] [address] [place], [place] [address]    Culture   Final    NO GROWTH 5 DAYS Performed at [facility], [place] [address] [place], [place] [address]    Report Status 02/14/2019 FINAL  Final  MRSA PCR Screening     Status: Abnormal   Collection Time: 02/09/19  4:00 PM  Result Value Ref Range Status   MRSA by PCR POSITIVE (A) NEGATIVE Final    Comment:        The GeneXpert MRSA Assay (FDA approved for NASAL specimens only), is one component of a comprehensive MRSA colonization surveillance program. It is not intended to diagnose MRSA infection nor to guide  or monitor treatment for MRSA infections. RESULT CALLED TO, READ BACK BY AND VERIFIED WITH: K.DUNKELBERGER AT 1911 ON 02/09/19 BY N.THOMPSON Performed at [facility], [place] [address], [place],  [address]       Studies: Dg Chest Port 1 View  Result Date: 02/15/2019 CLINICAL DATA:  Cough and shortness of breath worsening today. EXAM: PORTABLE CHEST 1 VIEW COMPARISON:  02/08/2019 FINDINGS: Previous median sternotomy. Chronic cardiomegaly and aortic atherosclerosis. Power port in place from a right internal jugular approach with its tip in the SVC above the right atrium. Worsened pulmonary density diffusely suggesting edema. Worsening density in both lower lobes right more than left that could be simple atelectasis or pneumonia. Probable pleural effusion on the right. IMPRESSION: Worsening of edema pattern. Worsening of lower lobe density particularly on the right which could be atelectasis and/or pneumonia. Probable right effusion. Electronically Signed  By: Nelson Chimes M.D.   On: 02/15/2019 09:16    Scheduled Meds: . asenapine  5 mg Sublingual BID  . azithromycin  500 mg Oral QHS  . famotidine  20 mg Oral BID  . feeding supplement (ENSURE ENLIVE)  237 mL Oral BID BM  . furosemide  40 mg Intravenous BID  . mirabegron ER  25 mg Oral Daily  . multivitamin with minerals  1 tablet Oral Daily  . PARoxetine  10 mg Oral Daily  . pramipexole  2.5 mg Oral BID  . sodium chloride flush  5 mL Intracatheter Q8H    Continuous Infusions: . sodium chloride 250 mL (02/15/19 1056)  . ceFEPime (MAXIPIME) IV 2 g (02/15/19 1057)     LOS: 6 days     Kayleen Memos, MD [facility] Pager [phone]  If 7PM-7AM, please contact night-coverage [email or web address] Password [email or web address] 02/15/2019, 1:01 PM

## 2019-02-15 NOTE — Progress Notes (Signed)
   02/15/19 1500  Clinical Encounter Type  Visited With Patient not available  Visit Type Initial  Referral From Palliative care team  Consult/Referral To Chaplain  The chaplain attempted to visit PMT Pt.  The chaplain read the Pt. chart and checked in with the Pt. RN-Megan.  The chaplain understands the Pt. is away from the room for a medical procedure. The chaplain will provide F/U spiritual care as needed.

## 2019-02-15 NOTE — Progress Notes (Signed)
The chaplain returned for spiritual care visit.  The Pt. was not in his room at the time of the chaplain visit.

## 2019-02-15 NOTE — Progress Notes (Signed)
Patient is scheduled for a thoracentesis.  Per conversation with nurse, will hold off on echo until after the procedure.

## 2019-02-15 NOTE — Procedures (Signed)
Ultrasound-guided diagnostic and therapeutic right  thoracentesis performed yielding 950 cc of hazy, yellow fluid. No immediate complications. Follow-up chest x-ray pending. The fluid was sent to the lab for prerordered studies. EBL none.

## 2019-02-15 NOTE — Progress Notes (Signed)
Pt. Slept overnight, confused this a.m. and threatening to leave the hospital. Sitter at bedside.

## 2019-02-16 ENCOUNTER — Inpatient Hospital Stay (HOSPITAL_COMMUNITY): Payer: Medicare HMO

## 2019-02-16 DIAGNOSIS — C7982 Secondary malignant neoplasm of genital organs: Secondary | ICD-10-CM

## 2019-02-16 DIAGNOSIS — C679 Malignant neoplasm of bladder, unspecified: Principal | ICD-10-CM

## 2019-02-16 DIAGNOSIS — D649 Anemia, unspecified: Secondary | ICD-10-CM

## 2019-02-16 DIAGNOSIS — R410 Disorientation, unspecified: Secondary | ICD-10-CM

## 2019-02-16 DIAGNOSIS — D696 Thrombocytopenia, unspecified: Secondary | ICD-10-CM

## 2019-02-16 DIAGNOSIS — I340 Nonrheumatic mitral (valve) insufficiency: Secondary | ICD-10-CM

## 2019-02-16 LAB — BASIC METABOLIC PANEL
Anion gap: 7 (ref 5–15)
BUN: 39 mg/dL — ABNORMAL HIGH (ref 8–23)
CO2: 21 mmol/L — ABNORMAL LOW (ref 22–32)
Calcium: 8.4 mg/dL — ABNORMAL LOW (ref 8.9–10.3)
Chloride: 109 mmol/L (ref 98–111)
Creatinine, Ser: 1.84 mg/dL — ABNORMAL HIGH (ref 0.61–1.24)
GFR calc Af Amer: 39 mL/min — ABNORMAL LOW (ref >60)
GFR calc non Af Amer: 34 mL/min — ABNORMAL LOW (ref >60)
Glucose, Bld: 99 mg/dL (ref 70–99)
Potassium: 3.4 mmol/L — ABNORMAL LOW (ref 3.5–5.1)
Sodium: 137 mmol/L (ref 135–145)

## 2019-02-16 LAB — ECHOCARDIOGRAM COMPLETE
Height: 68 in
Weight: 3097.02 oz

## 2019-02-16 LAB — PH, BODY FLUID: pH, Body Fluid: 7

## 2019-02-16 LAB — PREPARE RBC (CROSSMATCH)

## 2019-02-16 MED ORDER — SODIUM CHLORIDE 0.9% IV SOLUTION: Freq: Once | INTRAVENOUS | Status: AC

## 2019-02-16 MED ORDER — SODIUM CHLORIDE 0.9% IV SOLUTION
Freq: Once | INTRAVENOUS | Status: AC
  Administered 2019-02-16: 09:00:00 via INTRAVENOUS

## 2019-02-16 MED ORDER — BOOST PLUS PO LIQD
237.0000 mL | Freq: Three times a day (TID) | ORAL | Status: DC
  Administered 2019-02-16 – 2019-02-19 (×6): 237 mL via ORAL
  Filled 2019-02-16 (×15): qty 237

## 2019-02-16 MED ORDER — POTASSIUM CHLORIDE CRYS ER 20 MEQ PO TBCR
40.0000 meq | EXTENDED_RELEASE_TABLET | Freq: Once | ORAL | Status: AC
  Administered 2019-02-16: 40 meq via ORAL
  Filled 2019-02-16: qty 2

## 2019-02-16 MED ORDER — POLYVINYL ALCOHOL 1.4 % OP SOLN
1.0000 [drp] | OPHTHALMIC | Status: DC | PRN
  Filled 2019-02-16: qty 15

## 2019-02-16 MED ORDER — FUROSEMIDE 10 MG/ML IJ SOLN
40.0000 mg | Freq: Once | INTRAMUSCULAR | Status: AC
  Administered 2019-02-16: 40 mg via INTRAVENOUS
  Filled 2019-02-16: qty 4

## 2019-02-16 MED ORDER — FUROSEMIDE 10 MG/ML IJ SOLN: 40.0000 mg | Freq: Every day | INTRAMUSCULAR | Status: DC

## 2019-02-16 MED ORDER — LORAZEPAM 0.5 MG PO TABS
0.5000 mg | ORAL_TABLET | Freq: Once | ORAL | Status: AC
  Administered 2019-02-16: 0.5 mg via ORAL
  Filled 2019-02-16: qty 1

## 2019-02-16 NOTE — Progress Notes (Signed)
CRITICAL VALUE ALERT  Critical Value:  Hgb 6.5 and Platelets 27  Date & Time Notied:  02/16/19 0500  Provider Notified: Lamar Blinks  Orders Received/Actions taken: awaiting orders

## 2019-02-16 NOTE — Progress Notes (Signed)
IP PROGRESS NOTE  Subjective:   Mr. Schnackenberg is known to me with history of advanced urothelial carcinoma of the bladder as well as the genitourinary tract.  He has not been on any active treatment and has been on supportive care only.  He was hospitalized on February 08, 2019 for a delirium as well as heart failure and fluid retention.  Over the last week or so his status has improved slightly and currently receiving packed red cell transfusion.  His mentation has improved although still has some issues with delirium at nighttime.  He appears overall comfortable.  Objective:  Vital signs in last 24 hours: Temp:  [97.3 F (36.3 C)-98 F (36.7 C)] 97.5 F (36.4 C) (02/20 1223) Pulse Rate:  [67-100] 99 (02/20 1223) Resp:  [16-20] 18 (02/20 1223) BP: (85-119)/(59-81) 113/73 (02/20 1223) SpO2:  [80 %-100 %] 99 % (02/20 1150) Weight change:  Last BM Date: 02/14/19  Intake/Output from previous day: 02/19 0701 - 02/20 0700 In: 369.7 [P.O.:180; I.V.:39.5; IV Piggyback:150.2] Out: 1300 [Urine:1300] General: Alert, awake without distress appears chronically ill.. Head: Normocephalic atraumatic. Mouth: mucous membranes moist, pharynx normal without lesions Eyes: No scleral icterus.  Pupils are equal and round reactive to light. Resp: clear to auscultation bilaterally without rhonchi or wheezes or dullness to percussion. Cardio: regular rate and rhythm, S1, S2 normal, no murmur, click, rub or gallop GI: soft, non-tender; bowel sounds normal; no masses,  no organomegaly Musculoskeletal: No joint deformity or effusion. Neurological: No motor, sensory deficits.  Intact deep tendon reflexes. Skin: No rashes or lesions.   Lab Results: Recent Labs    02/15/19 0356 02/16/19 0340  WBC 9.1 6.3  HGB 7.9* 6.5*  HCT 26.3* 21.8*  PLT 32* 27*    BMET Recent Labs    02/15/19 0356 02/16/19 0340  NA 137 137  K 4.6 3.4*  CL 109 109  CO2 21* 21*  GLUCOSE 103* 99  BUN 38* 39*  CREATININE  1.76* 1.84*  CALCIUM 9.3 8.4*    Studies/Results: Dg Chest 1 View  Result Date: 02/15/2019 CLINICAL DATA:  Status post RIGHT thoracentesis. EXAM: CHEST  1 VIEW COMPARISON:  Chest radiograph February 15, 2019 at 0826 hours. FINDINGS: Improved aeration RIGHT lung base with fluid within the RIGHT fissure. Slight blunting LEFT costophrenic angle. Similar bronchitic changes. Stable cardiomegaly. Calcified aortic arch. Status post median sternotomy for CABG. Single-lumen RIGHT chest Port-A-Cath with distal tip at cavoatrial junction. No pneumothorax. Soft tissue planes and included osseous structures are unchanged. Bilateral nephroureteral stents noted. IMPRESSION: Improved aeration RIGHT lung base with fluid along the fissure. Slight blunting LEFT costophrenic angle. No pneumothorax. Similar bronchitic changes. Aortic Atherosclerosis (ICD10-I70.0). Electronically Signed   By: Elon Alas M.D.   On: 02/15/2019 15:49        Medications: I have reviewed the patient's current medications.  Assessment/Plan:  84 year old with:  1.  Advanced GU tumor currently not on any active treatment.  He has poor tolerance to previous chemotherapy and immunotherapy and we have opted to proceed with supportive management only.  This was reiterated today to the patient and his wife and they are in agreement about this approach.  2.  Anemia: Related to malignancy as well as recent hospitalization.  Agree with packed red cell transfusion.  3.  Thrombocytopenia: Chronic in nature and related to malignancy and underlying possible liver disease.  His platelet count is adequate and does not have any active bleeding.  4.  Prognosis and goals of care: His  prognosis is poor with multiple comorbid conditions as well as end-stage bladder cancer.  I have recommended proceeding with hospice which I have discussed with the patient and his wife today and have done so on multiple occasions in the past.  5.  Delirium:  Multifactorial in nature with congestive heart failure, underlying malignancy and possibly baseline dementia.  At this point to both are in agreement at this time.  He might require placement upon discharge whether any inpatient psychiatric facility versus skilled nursing facility versus residential hospice.  I answered all their questions today and I reiterated my agreement with the ongoing care that he is receiving.  Please call with any questions regarding this patient.  25  minutes was spent with the patient face-to-face today.  82 more than 50% of time was dedicated to reviewing her disease status update, imaging studies and answering questions regarding future plan of care.     LOS: 7 days   Zola Button 02/16/2019, 12:50 PM

## 2019-02-16 NOTE — Progress Notes (Addendum)
Pt with increased agitation this evening. Patient unscrewed IV tubing from port-a-cath. Also attempting to slam door and grab staff. NP on call paged and orders given for restraints. Security called to bedside and were able to divert the patient to his bed. Patient agreeable to try to get some rest after redirection; verbal stimuli decreased. Restraints not applied at this time due to cooperation from patient. Will continue to monitor patient.

## 2019-02-16 NOTE — Progress Notes (Signed)
Nutrition Follow-up  DOCUMENTATION CODES:   Not applicable  INTERVENTION:   D/C Ensure Enlive   Add Boost Plus chocolate TID- Each supplement provides 360kcal and 14g protein.    Continue MVI daily  NUTRITION DIAGNOSIS:   Inadequate oral intake related to lethargy/confusion as evidenced by meal completion < 25%.  Progressing  GOAL:   Patient will meet greater than or equal to 90% of their needs  Progressing  MONITOR:   PO intake, Supplement acceptance, Weight trends, Labs, I & O's  REASON FOR ASSESSMENT:   Consult Assessment of nutrition requirement/status  ASSESSMENT:   Patient with PMH significant for CAD s/p CABG, GERD,CHF, metastatic bladder cancer s/p suprapubic catheter, nephrostomy tubes, ostomy, and dementia with sundowning. Presents this admission with agitation and homicidal ideations.    Pt alert and oriented this morning. Reports having a great appetite over the last few days. Meal completions charted as 50-100% for pt's last seven meals. Has been drinking Ensure but prefers Boost. RD to change.   A recent wt has not been obtained since admission.   PMT to discuss goals of care moving forward. Per oncology, prognosis looks to be poor in light of multiple comorbid conditions and end-stage bladder cancer.   Medications reviewed and include: MVI with minerals Labs reviewed: K 3.4 (L)   Diet Order:   Diet Order            Diet Heart Room service appropriate? Yes; Fluid consistency: Thin  Diet effective now              EDUCATION NEEDS:   Not appropriate for education at this time  Skin:  Skin Assessment: Reviewed RN Assessment  Last BM:  2/19- colostomy   Height:   Ht Readings from Last 1 Encounters:  02/08/19 5\' 8"  (1.727 m)    Weight:   Wt Readings from Last 1 Encounters:  02/08/19 87.8 kg    Ideal Body Weight:  70 kg  BMI:  Body mass index is 29.43 kg/m.  Estimated Nutritional Needs:   Kcal:  1800-2000 kcal  Protein:   90-105 grams  Fluid:  >/= 1.8 L/day   Mariana Single RD, LDN Clinical Nutrition Pager # - [phone]

## 2019-02-16 NOTE — Plan of Care (Addendum)
Pt calm and cooperative this shift. Pt ambulated in hallway, became SOB and O2 dropped to 88.   Later in shift, pt C/O being restless and unable to sleep @ 0115. On call notified and a 1 X  Order for 0.5 mg of Ativan  was received.

## 2019-02-16 NOTE — Progress Notes (Signed)
Palliative care progress note  Reason for consult: Goals of care in light of his metastatic cancer  I met today with patient, his wife has gone to the airport to drop off her son.  Discussed with bedside RN, also with Air cabin crew. Also discussed with TRH MD.   It is noted that the patient rested well overnight. He did not have any episodes of confusion or agitation.   The patient is resting by his bed, he responds appropriately. denies any pain, denies shortness of breath.   PPS 50%    BP 113/73   Pulse 99   Temp (!) 97.5 F (36.4 C) (Axillary)   Resp 18   Ht _0  (1.727 m)   Wt 87.8 kg   SpO2 99%   BMI 29.43 kg/m  Labs and imaging noted  Awake alert Less confused No distress Regular breathing No edema Non focal  - Dilaudid as needed. - His wife is interested in hospice services at home, but there remains concern about safety when he transitions home.  His stepson has locked up or removed all of his firearms and knives in the home.    Ongoing trials of appropriate medications is underway. Repeat Psych eval recommendations noted. Family now also willing to explore if patient will benefit from inpatient psych.    Patient's stepson states that he is concerned about a safe disposition, in addition, he is also very concerned about being as fiscally responsible as possible, the patient's wife has some limited amount of resources available to her and they want to be as cautious as possible.   For now, continue to monitor behaviors and continue overall goals of care discussions. Appreciate efforts being made on the part of care management and social work in exploring safe possible discharge options for the patient and the family as a unit.    - PMT to continue to follow for appropriate disposition options.    Total time: 25 minutes Greater than 50%  of this time was spent counseling and coordinating care related to the above assessment and plan.  Loistine Chance, MD [place] Team [phone]

## 2019-02-16 NOTE — Progress Notes (Signed)
  Echocardiogram 2D Echocardiogram has been performed.  Charles Coffey 02/16/2019, 10:15 AM
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
## 2019-02-16 NOTE — Care Management Important Message (Signed)
Important Message  Patient Details  Name: ZALAN SHIDLER MRN: [id number] Date of Birth: 1935-12-03   Medicare Important Message Given:  Yes    Kerin Salen 02/16/2019, 1:00 [place] Message  Patient Details  Name: ENRIGUE HASHIMI MRN: [id number] Date of Birth: December 22, 1935   Medicare Important Message Given:  Yes    Kerin Salen 02/16/2019, 1:00 PM

## 2019-02-17 LAB — BPAM RBC
Blood Product Expiration Date: 202003122359
Blood Product Expiration Date: 202003132359
ISSUE DATE / TIME: 202002200901
ISSUE DATE / TIME: 202002201158
Unit Type and Rh: 6200
Unit Type and Rh: 6200

## 2019-02-17 LAB — BASIC METABOLIC PANEL
Anion gap: 7 (ref 5–15)
BUN: 42 mg/dL — ABNORMAL HIGH (ref 8–23)
CO2: 23 mmol/L (ref 22–32)
CREATININE: 1.97 mg/dL — AB (ref 0.61–1.24)
Calcium: 9.1 mg/dL (ref 8.9–10.3)
Chloride: 107 mmol/L (ref 98–111)
GFR calc Af Amer: 36 mL/min — ABNORMAL LOW (ref >60)
GFR calc non Af Amer: 31 mL/min — ABNORMAL LOW (ref >60)
Glucose, Bld: 111 mg/dL — ABNORMAL HIGH (ref 70–99)
Potassium: 4.1 mmol/L (ref 3.5–5.1)
Sodium: 137 mmol/L (ref 135–145)

## 2019-02-17 LAB — CBC
HEMATOCRIT: 28.8 % — AB (ref 39.0–52.0)
Hemoglobin: 9 g/dL — ABNORMAL LOW (ref 13.0–17.0)
MCH: 31.6 pg (ref 26.0–34.0)
MCHC: 31.3 g/dL (ref 30.0–36.0)
MCV: 101.8 fL — ABNORMAL HIGH (ref 80.0–100.0)
Platelets: 33 10*3/uL — ABNORMAL LOW (ref 150–400)
RBC: 2.85 MIL/uL — ABNORMAL LOW (ref 4.22–5.81)
RDW: 23 % — ABNORMAL HIGH (ref 11.5–15.5)
WBC: 7.4 10*3/uL (ref 4.0–10.5)
nRBC: 0 % (ref 0.0–0.2)

## 2019-02-17 LAB — TYPE AND SCREEN
ABO/RH(D): A POS
Antibody Screen: NEGATIVE
Unit division: 0
Unit division: 0

## 2019-02-17 LAB — ACID FAST SMEAR (AFB, MYCOBACTERIA): Acid Fast Smear: NEGATIVE

## 2019-02-17 LAB — MAGNESIUM: Magnesium: 2.1 mg/dL (ref 1.7–2.4)

## 2019-02-17 MED ORDER — FUROSEMIDE 40 MG PO TABS
40.0000 mg | ORAL_TABLET | Freq: Every day | ORAL | Status: DC
  Administered 2019-02-17 – 2019-02-21 (×5): 40 mg via ORAL
  Filled 2019-02-17 (×5): qty 1

## 2019-02-17 MED ORDER — FUROSEMIDE 40 MG PO TABS: 40.0000 mg | ORAL_TABLET | Freq: Three times a day (TID) | ORAL | Status: DC

## 2019-02-17 MED ORDER — FLUCONAZOLE 100 MG PO TABS
100.0000 mg | ORAL_TABLET | Freq: Every day | ORAL | Status: AC
  Administered 2019-02-17 – 2019-02-19 (×3): 100 mg via ORAL
  Filled 2019-02-17 (×3): qty 1
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
## 2019-02-17 NOTE — Progress Notes (Signed)
PROGRESS NOTE  Charles Coffey LTR:[id number] DOB: August 01, 1935 DOA: 02/08/2019 PCP: [name], MD  HPI/Recap of past 24 hours: 84 y.o.malewith medical history significant of CAD s/p CABG, GERD,metastatic bladder cancer status post suprapubic catheter, nephrostomy tube, mild dementia with sundowning, presented with agitation and homoccidal ideations. Patient recently started Bactrim for UTI.Early in the week she was treated with Augmentin after was found to be febrile up to 102 he was seen by urology and urine sample was taken. He continued to be febrile Monday Tuesday and Wednesday. Finally has improved 1 week ago. He has been having worsening confusion while febrile. Decreased urinary output. He actually stopped using his Lasix in the past few days. Patient has been having sundowning and was seen by oncologist who prescribedHaldol 1 mg instructed to take 1-2 before bedtime. Yesterdayseemed to help with agitation. He has been getting gradually weaker has been having some shortness of breath Familygiven [employer] small THC cookiehe slept all day and became very agitated. Chasing them with knives. Trying to take out his ostomy bag not understanding why its there.Police was then called and patient was tased by the police.  02/15/19: Seen and examined at his bedside. Denies suicidal ideation. He is alert and oriented x 2. Has a sitter at his bedside for his own safety. States he feels like he can't breathe.  Denies chest pain.  Repeat cxr done today 12/16/2019 revealed worsening pulmonary edema and R pleural effusion. Will obtain BNP and possibly 2D echo if significantly elevated.  02/16/2019: Patient seen and examined at bedside.  No new complaints today.  No agitation overnight and none reported this morning.  Worsening anemia and thrombocytopenia this morning.  Discussed with Dr. [name] his oncologist.  Prognosis is poor, recommends proceeding with hospice.  Wife is in agreement.   Palliative care team assisting with management.  02/17/19: Reported agitation overnight.  Patient's wife states she will be here with him tonight, will spend the night.  This morning he has no new complaints.  He is alert and interactive with no sign of agitation.   Assessment/Plan: Principal Problem:   Delirium Active Problems:   Hyperlipidemia LDL goal <70   HTN (hypertension)   Coronary artery disease of native heart with stable angina pectoris (HCC)   GERD   RLS (restless legs syndrome)   Chronic anemia   CKD (chronic kidney disease) stage 3, GFR 30-59 ml/min (HCC)   Hydronephrosis due to obstructive malignant neoplasm of bladder (HCC)   Elevated troponin   Urothelial carcinoma of bladder (HCC)   Dehydration   Thrombocytopathia (HCC)   Chronic systolic heart failure (HCC)   Lower urinary tract infectious disease   Hyponatremia   Agitation   AKI (acute kidney injury) ([place])   Hypokalemia   CAP (community acquired pneumonia)   Goals of care, counseling/discussion  Delirium Continue medication as recommended by psychiatry Continue to closely monitor Fall precaution Reorient as indicated Plan to discharge home with hospice possibly tomorrow 02/18/2019  Anemia of chronic disease Hemoglobin is stable post 2 unit PRBCs transfusion No sign of overt bleeding  Thrombocytopenia No active bleeding Platelet 33K from 27K yesterday  Acute on chronic systolic CHF Last 2D echo done on 02/16/2019 revealed LVEF 40-45% with severely dilated left atrium Continue strict I's and O's and daily weight Back on p.o. Lasix due to AKI  AKI on CKD 3 Baseline creatinine appears to be 1.6 Presented with creatinine of 2.49 Creatinine today 1.97 DC IV Lasix Continue p.o. Lasix 40  mg daily At home was on Lasix 40 mg 3 times daily Good urine output Net I&O's -4.5 L since admission  Bilateral pleural effusion worse on the right post right thoracentesis by IR Right thoracentesis by  IR Pathology fluid pending  Candidiuria Urine cx grew 50,000 yeast Negative blood cx x2, no growth 5 days No sign of leukopenia or neutropenia Has suprapubic cath in place Currently on IV fluconazole We will switch to oral Diflucan Plan is to DC to home hospice  Hyperbilirubinemia Unclear etiology T bili 1.4 from 1.7 ALT trending down from 1 85-1 63-1 29  Hypokalemia resolved post repletion  . HTN (hypertension)blood pressure currently stable.  On p.o. Lasix 40 mg daily  . Hydronephrosis due to obstructive malignant neoplasm of bladder [facility])- -urology consulted. Appreciate input. -Recommendation for transition from nephroureteral catheters to antegrade stents in preparation for transition to hospice -stent placement done on 2/17  . Urothelial carcinoma of bladder (HCC)-was in the process of getting hospice set up.  -Palliative Care continuing to follow, appreciate input -Poor prognosis per oncology -Plan to DC home with hospice  . Hyperlipidemia LDL goal <70 -Noted to be chronic, presently stable  . Coronary artery disease of native heart with stable angina pectoris (HCC)denies chest pain  . GERDstable continue home medications  . RLS (restless legs syndrome)stable continue home medications  . Elevated troponinlikely demand ischemia.  Denies chest pain.  Peaked at 0.08.  Marland Kitchen  Resolved hyponatremia  CAP small infiltrate noted on chest x-ray, reviewed -Completed course of azithromycin and cefepime  Colostomy site with bulging colostomy. Patient have been known to remove his colostomy bag and pulled on the ostomy wife is concerned that he may cause damage. Admitting physician discussed with general surgery who at this point recommends applying abdominal binder not tightly but for the purposes of concealing the site so that the patient will be less likely to pull on it. -Seen and evaluated by General Surgery 2/17. Stable at this time  DVT prophylaxis:  SCD's not on pharmacological DVT prophylaxis due to significant thrombocytopenia Code Status: DNR Family Communication:  None at bedside Disposition Plan:  Home with hospice possibly tomorrow 02/18/2019  Consultants:   Urology  Palliative Care  General Surgery  Procedures:     Antimicrobials:             Anti-infectives (From admission, onward)     Start        Dose/Rate  Route  Frequency  Ordered  Stop     02/12/19 1100    ceFEPIme (MAXIPIME) 2 g in sodium chloride 0.9 % 100 mL IVPB       2 g  200 mL/hr over 30 Minutes  Intravenous  Every 12 hours  02/12/19 0834        02/11/19 2200    azithromycin (ZITHROMAX) tablet 500 mg       500 mg  Oral  Daily at bedtime  02/11/19 0956        02/11/19 1200    fluconazole (DIFLUCAN) tablet 150 mg       150 mg  Oral   Once  02/11/19 0959  02/11/19 1230     02/10/19 1700    azithromycin (ZITHROMAX) 500 mg in sodium chloride 0.9 % 250 mL IVPB  Status:  Discontinued       500 mg  250 mL/hr over 60 Minutes  Intravenous  Every 24 hours  02/10/19 1539  02/11/19 0956     02/10/19 1630  ceFEPIme (MAXIPIME) 2 g in sodium chloride 0.9 % 100 mL IVPB  Status:  Discontinued       2 g  200 mL/hr over 30 Minutes  Intravenous  Every 24 hours  02/10/19 1540  02/12/19 0834     02/09/19 1800    cefTRIAXone (ROCEPHIN) 1 g in sodium chloride 0.9 % 100 mL IVPB  Status:  Discontinued       1 g  200 mL/hr over 30 Minutes  Intravenous  Every 24 hours  02/08/19 2133  02/10/19 1537     02/08/19 1745    fluconazole (DIFLUCAN) tablet 150 mg  Status:  Discontinued       150 mg  Oral   Once  02/08/19 1731  02/12/19 0841     02/08/19 1715    cefTRIAXone (ROCEPHIN) 1 g in sodium chloride 0.9 % 100 mL IVPB       1 g  200 mL/hr over 30 Minutes  Intravenous   Once  02/08/19 1702  02/08/19 1855     02/08/19 1715    azithromycin (ZITHROMAX) 500 mg in  sodium chloride 0.9 % 250 mL IVPB       500 mg  250 mL/hr over 60 Minutes  Intravenous   Once  02/08/19 1702  02/08/19 2058         Objective: Vitals:   02/16/19 1223 02/16/19 1534 02/16/19 2216 02/17/19 0546  BP: 113/73 108/68 100/73 114/78  Pulse: 99 90 81 87  Resp: 18 18 17 16   Temp: (!) 97.5 F (36.4 C) 97.7 F (36.5 C) 98.2 F (36.8 C) 97.9 F (36.6 C)  TempSrc: Axillary Oral Oral Axillary  SpO2:  99% 100% 98%  Weight:      Height:        Intake/Output Summary (Last 24 hours) at 02/17/2019 1237 Last data filed at 02/17/2019 1053 Gross per 24 hour  Intake 1334 ml  Output 3400 ml  Net -2066 ml   Filed Weights   02/08/19 1444  Weight: 87.8 kg    Exam:  . General: 84 y.o. year-old male well-developed well-nourished in no acute distress.  Alert and interactive. . Cardiovascular: Regular rate and rhythm with no rubs or gallops.  No JVD or thyromegaly.   Marland Kitchen Respiratory: Clear to auscultation with no wheezes or rales.  Poor inspiratory effort.  .  Abdomen: Soft nontender nondistended with normal bowel sounds x4 quadrants. . Musculoskeletal: 1+ pitting edema in lower extremities bilaterally. 2/4 pulses in all 4 extremities. Marland Kitchen Psychiatry: Mood is appropriate for condition and setting   Data Reviewed: CBC: Recent Labs  Lab 02/13/19 0339 02/14/19 0828 02/15/19 0356 02/16/19 0340 02/17/19 0430  WBC 6.1 8.4 9.1 6.3 7.4  NEUTROABS  --   --   --  4.2  --   HGB 7.2* 8.0* 7.9* 6.5* 9.0*  HCT 23.7* 26.5* 26.3* 21.8* 28.8*  MCV 107.7* 107.7* 107.8* 107.4* 101.8*  PLT 33* 33* 32* 27* 33*   Basic Metabolic Panel: Recent Labs  Lab 02/13/19 0339 02/14/19 0828 02/15/19 0356 02/16/19 0340 02/17/19 0430  NA 137 137 137 137 137  K 4.6 4.8 4.6 3.4* 4.1  CL 109 109 109 109 107  CO2 23 20* 21* 21* 23  GLUCOSE 107* 111* 103* 99 111*  BUN 31* 35* 38* 39* 42*  CREATININE 1.65* 1.61* 1.76* 1.84* 1.97*  CALCIUM 9.1 9.4 9.3 8.4* 9.1  MG  --   --   --   --  2.1  GFR: Estimated Creatinine Clearance: 31.7 mL/min (A) (by C-G formula based on SCr of 1.97 mg/dL (H)). Liver Function Tests: Recent Labs  Lab 02/13/19 1308 02/14/19 0828 02/15/19 0356  AST 46* 43* 36  ALT 185* 163* 129*  ALKPHOS 67 68 63  BILITOT 1.5* 1.7* 1.4*  PROT 6.5 7.2 6.4*  ALBUMIN 2.9* 3.3* 2.9*   No results for input(s): LIPASE, AMYLASE in the last 168 hours. Recent Labs  Lab 02/13/19 1210  AMMONIA 13   Coagulation Profile: Recent Labs  Lab 02/13/19 1308  INR 1.31   Cardiac Enzymes: No results for input(s): CKTOTAL, CKMB, CKMBINDEX, TROPONINI in the last 168 hours. BNP (last 3 results) No results for input(s): PROBNP in the last 8760 hours. HbA1C: No results for input(s): HGBA1C in the last 72 hours. CBG: No results for input(s): GLUCAP in the last 168 hours. Lipid Profile: No results for input(s): CHOL, HDL, LDLCALC, TRIG, CHOLHDL, LDLDIRECT in the last 72 hours. Thyroid Function Tests: No results for input(s): TSH, T4TOTAL, FREET4, T3FREE, THYROIDAB in the last 72 hours. Anemia Panel: No results for input(s): VITAMINB12, FOLATE, FERRITIN, TIBC, IRON, RETICCTPCT in the last 72 hours. Urine analysis:    Component Value Date/Time   COLORURINE YELLOW 02/08/2019 1557   APPEARANCEUR CLOUDY (A) 02/08/2019 1557   LABSPEC 1.015 02/08/2019 1557   PHURINE 5.0 02/08/2019 1557   GLUCOSEU NEGATIVE 02/08/2019 1557   HGBUR LARGE (A) 02/08/2019 1557   HGBUR negative 10/15/2010 1251   BILIRUBINUR NEGATIVE 02/08/2019 1557   BILIRUBINUR n 12/01/2016 1521   KETONESUR NEGATIVE 02/08/2019 1557   PROTEINUR 30 (A) 02/08/2019 1557   UROBILINOGEN 0.2 12/01/2016 1521   UROBILINOGEN 0.2 10/15/2010 1251   NITRITE NEGATIVE 02/08/2019 1557   LEUKOCYTESUR LARGE (A) 02/08/2019 1557   Sepsis Labs: @LABRCNTIP (procalcitonin:4,lacticidven:4)  ) Recent Results (from the past 240 hour(s))  Urine culture     Status: Abnormal   Collection Time: 02/08/19  3:57 PM  Result Value  Ref Range Status   Specimen Description   Final    URINE, RANDOM Performed at [facility] [address], [place], [place] [address]    Special Requests   Final    NONE Performed at [facility], [place] [address], [place], [place] [address]    Culture 50,000 COLONIES/mL YEAST (A)  Final   Report Status 02/10/2019 FINAL  Final  Blood culture (routine x 2)     Status: None   Collection Time: 02/08/19 10:39 PM  Result Value Ref Range Status   Specimen Description   Final    BLOOD RIGHT ARM Performed at [facility] [address] [place], [place] [address]    Special Requests   Final    BOTTLES DRAWN AEROBIC ONLY Blood Culture adequate volume Performed at [facility] [address] [place], [place] [address]    Culture   Final    NO GROWTH 5 DAYS Performed at [facility], [place] [address], [place], [place] [address]    Report Status 02/14/2019 FINAL  Final  Blood culture (routine x 2)     Status: None   Collection Time: 02/08/19 10:39 PM  Result Value Ref Range Status   Specimen Description   Final    BLOOD LEFT HAND Performed at [facility] [address] [place],  [address]    Special Requests   Final    BOTTLES DRAWN AEROBIC AND ANAEROBIC Blood Culture adequate volume Performed at [facility]  [place] [address], [place], [place] [address]    Culture   Final    NO GROWTH 5 DAYS Performed at [facility], [place] [address] [place], [place] [address]    Report Status 02/14/2019 FINAL  Final  MRSA PCR Screening     Status: Abnormal   Collection Time: 02/09/19  4:00 PM  Result Value Ref Range Status   MRSA by PCR POSITIVE (A) NEGATIVE Final    Comment:        The GeneXpert MRSA Assay (FDA approved for NASAL specimens only), is one component of a comprehensive MRSA colonization surveillance program. It is not intended to diagnose  MRSA infection nor to guide or monitor treatment for MRSA infections. RESULT CALLED TO, READ BACK BY AND VERIFIED WITH: K.DUNKELBERGER AT 1911 ON 02/09/19 BY N.THOMPSON Performed at [facility], [place] [address] [place], [place] [address]   Body fluid culture     Status: None (Preliminary result)   Collection Time: 02/15/19  3:12 PM  Result Value Ref Range Status   Specimen Description   Final    PLEURAL RIGHT Performed at [facility] [address] [place], [place] [address]    Special Requests   Final    NONE Performed at [facility], [place] [address], [place], [place] [address]    Gram Stain   Final    WBC PRESENT, PREDOMINANTLY MONONUCLEAR NO ORGANISMS SEEN CYTOSPIN SMEAR Performed at [facility], [place] [address], [place],  [address]    Culture NO GROWTH 2 DAYS  Final   Report Status PENDING  Incomplete      Studies: No results found.  Scheduled Meds: . asenapine  5 mg Sublingual BID  . famotidine  20 mg Oral BID  . fluconazole  100 mg Oral Daily  . furosemide  40 mg Oral Daily  . lactose free nutrition  237 mL Oral TID WC  . mirabegron ER  25 mg Oral Daily  . multivitamin with minerals  1 tablet Oral Daily  . PARoxetine  10 mg Oral Daily  . pramipexole  2.5 mg Oral BID    Continuous Infusions: . sodium chloride 250 mL (02/16/19 1740)     LOS: 8 days     Kayleen Memos, MD [facility] Pager [phone]  If 7PM-7AM, please contact night-coverage [email or web address] Password [email or web address] 02/17/2019, 12:37 PM

## 2019-02-17 NOTE — Progress Notes (Signed)
Pt was able to sleep for a few hours but woke up extremely agitated. Repeatedly hitting, swinging, and grabbing staff members. He also was pulling at tubes and drains. Unable to be redirected even with Security appearance. Restraints applied at this time for safety. Alger Memos, NP made aware. Carnella [place] I

## 2019-02-17 NOTE — Progress Notes (Signed)
Patient has remained calm, alert, oriented x4, and cooperative throughout day shift today.  Patient has eaten well and ambulated in room.  Patient up to chair for most of the day.  Wife and sitter at bedside.  Will continue to monitor.

## 2019-02-17 NOTE — Progress Notes (Signed)
Report received from [name], Therapist, sports. I agree with previous RN's assessment and will continue to monitor pt closely. Charles Coffey I

## 2019-02-18 MED ORDER — OXYCODONE-ACETAMINOPHEN 5-325 MG PO TABS
1.0000 | ORAL_TABLET | Freq: Three times a day (TID) | ORAL | Status: DC | PRN
  Administered 2019-02-19: 2 via ORAL
  Filled 2019-02-18: qty 2

## 2019-02-18 MED ORDER — ALTEPLASE 2 MG IJ SOLR
2.0000 mg | Freq: Once | INTRAMUSCULAR | Status: AC
  Administered 2019-02-18: 2 mg
  Filled 2019-02-18: qty 2

## 2019-02-18 MED ORDER — STERILE WATER FOR INJECTION IJ SOLN
INTRAMUSCULAR | Status: AC
  Filled 2019-02-18: qty 10

## 2019-02-18 MED ORDER — GABAPENTIN 100 MG PO CAPS
100.0000 mg | ORAL_CAPSULE | Freq: Two times a day (BID) | ORAL | Status: DC
  Administered 2019-02-18 – 2019-02-21 (×7): 100 mg via ORAL
  Filled 2019-02-18 (×7): qty 1

## 2019-02-18 MED ORDER — QUETIAPINE FUMARATE 25 MG PO TABS
25.0000 mg | ORAL_TABLET | Freq: Two times a day (BID) | ORAL | Status: DC
  Administered 2019-02-18 – 2019-02-19 (×3): 25 mg via ORAL
  Filled 2019-02-18 (×4): qty 1

## 2019-02-18 MED ORDER — OXYCODONE HCL 5 MG PO TABS: 5.0000 mg | ORAL_TABLET | Freq: Three times a day (TID) | ORAL | Status: DC | PRN

## 2019-02-18 MED ORDER — OXYCODONE HCL 5 MG PO TABS: 5.0000 mg | ORAL_TABLET | ORAL | Status: DC | PRN

## 2019-02-18 MED ORDER — OXYCODONE-ACETAMINOPHEN 5-325 MG PO TABS: 1.0000 | ORAL_TABLET | ORAL | Status: DC | PRN

## 2019-02-18 NOTE — Progress Notes (Signed)
Palliative care progress note  Reason for consult: Goals of care in light of his metastatic cancer  I met today with patient and his wife. Chart reviewed, the patient rested well overnight. His wife spent the night at the hospital.   Discussed with wife at the bedside.    It is noted that the patient rested well overnight. He did not have any episodes of confusion or agitation.   The patient is sitting up in chair, responds appropriately. denies any pain, denies shortness of breath. His appetite is much better, consuming most of his meals, also asking for snacks.   PPS 50%    BP 129/89 (BP Location: Right Arm)   Pulse 96   Temp 98 F (36.7 C) (Axillary)   Resp 20   Ht _0  (1.727 m)   Wt 87.8 kg   SpO2 100%   BMI 29.43 kg/m  Labs and imaging noted Med history noted.   Awake alert Less confused No distress Regular breathing No edema Non focal  - Dilaudid as needed. - His wife is interested in hospice services at home, will request care management to proceed with hospice referral. The patient might need durable medical equipment delivered and might need hospice arrangements completed before he is discharged, so as to ensure a safe disposition.    Ongoing trials of appropriate medications is underway. Repeat Psych eval recommendations noted.        - PMT to continue to follow for appropriate disposition options.   Wife to complete paperwork and arrangements for sitters with [facility].     Total time: 25 minutes Greater than 50%  of this time was spent counseling and coordinating care related to the above assessment and plan.  Loistine Chance, MD [facility] Team [phone]

## 2019-02-18 NOTE — Progress Notes (Signed)
PHARMACY NOTE -  Diflucan  Pharmacy has been assisting with dosing of Diflucan for candidiuria. Dosage remains stable at Diflucan 100mg  PO daily and need for further dosage adjustment appears unlikely at present.  Last dose will be tomorrow 2/23 & stop date in place.    Will sign off at this time.  Please reconsult if a change in clinical status warrants re-evaluation of dosage.  Netta Cedars, PharmD, BCPS 02/18/2019@8 :46 AM
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
## 2019-02-19 LAB — BODY FLUID CULTURE: Culture: NO GROWTH

## 2019-02-19 LAB — COMPREHENSIVE METABOLIC PANEL
ALK PHOS: 68 U/L (ref 38–126)
ALT: 57 U/L — AB (ref 0–44)
AST: 25 U/L (ref 15–41)
Albumin: 2.9 g/dL — ABNORMAL LOW (ref 3.5–5.0)
Anion gap: 6 (ref 5–15)
BUN: 37 mg/dL — ABNORMAL HIGH (ref 8–23)
CALCIUM: 9 mg/dL (ref 8.9–10.3)
CO2: 25 mmol/L (ref 22–32)
Chloride: 108 mmol/L (ref 98–111)
Creatinine, Ser: 1.89 mg/dL — ABNORMAL HIGH (ref 0.61–1.24)
GFR calc Af Amer: 38 mL/min — ABNORMAL LOW (ref >60)
GFR calc non Af Amer: 33 mL/min — ABNORMAL LOW (ref >60)
Glucose, Bld: 122 mg/dL — ABNORMAL HIGH (ref 70–99)
Potassium: 3.7 mmol/L (ref 3.5–5.1)
Sodium: 139 mmol/L (ref 135–145)
Total Bilirubin: 0.5 mg/dL (ref 0.3–1.2)
Total Protein: 6.4 g/dL — ABNORMAL LOW (ref 6.5–8.1)

## 2019-02-19 LAB — CBC
HCT: 26.5 % — ABNORMAL LOW (ref 39.0–52.0)
Hemoglobin: 8 g/dL — ABNORMAL LOW (ref 13.0–17.0)
MCH: 31.9 pg (ref 26.0–34.0)
MCHC: 30.2 g/dL (ref 30.0–36.0)
MCV: 105.6 fL — ABNORMAL HIGH (ref 80.0–100.0)
Platelets: 32 10*3/uL — ABNORMAL LOW (ref 150–400)
RBC: 2.51 MIL/uL — AB (ref 4.22–5.81)
RDW: 22.2 % — ABNORMAL HIGH (ref 11.5–15.5)
WBC: 4 10*3/uL (ref 4.0–10.5)
nRBC: 0 % (ref 0.0–0.2)

## 2019-02-19 LAB — PHOSPHORUS: Phosphorus: 3.6 mg/dL (ref 2.5–4.6)

## 2019-02-19 LAB — MAGNESIUM: Magnesium: 2.1 mg/dL (ref 1.7–2.4)

## 2019-02-19 MED ORDER — HYDROXYZINE HCL 25 MG PO TABS
25.0000 mg | ORAL_TABLET | Freq: Three times a day (TID) | ORAL | Status: DC | PRN
  Filled 2019-02-19: qty 1

## 2019-02-19 NOTE — Progress Notes (Addendum)
PROGRESS NOTE  Charles Coffey YJE:[id number] DOB: April 14, 1935 DOA: 02/08/2019 PCP: [name], MD  HPI/Recap of past 24 hours: 85 y.o.malewith medical history significant of CAD s/p CABG, GERD,metastatic bladder cancer status post suprapubic catheter, nephrostomy tube, mild dementia with sundowning, presented with agitation and homoccidal ideations. Patient recently started on Bactrim for UTI.Early in the week she was treated with Augmentin after was found to be febrile up to 102 he was seen by urology and urine sample was taken. He continued to be febrile Monday Tuesday and Wednesday. He has been having worsening confusion while febrile. Decreased urinary output. He actually stopped using his Lasix in the past few days. Patient has been having sundowning and was seen by oncologist who prescribedHaldol 1 mg instructed to take 1-2 before bedtime. Yesterdayseemed to help with agitation. He has been getting gradually weaker and has been having some shortness of breath. Familygiven hima small THC cookie. He slept all day and became very agitated afterwards. Chasing them with knives. Trying to take out his ostomy bag not understanding why its there.Police was then called and patient was tased by the police. Acute encephalopathy with agitation.  Hospital course complicated by delirium with agitation for which he was started on antipsychotic medication.  Also complicated by acute on chronic systolic CHF. Worsening anemia and thrombocytopenia.  Discussed with Dr. [name] his oncologist.  Prognosis is poor, recommends proceeding with hospice.  Wife is in agreement.  Palliative care team assisting with transition to hospice.  02/18/19: Per his wife who spent the night patient woke up 3 times due to hunger and had one episode of panic attack.  Discussed with psych on call.  Psych medications adjusted.  02/19/19: Somnolent this morning but easily arousable.  Ongoing trial of psych  medications to control agitation.   Assessment/Plan: Principal Problem:   Delirium Active Problems:   Hyperlipidemia LDL goal <70   HTN (hypertension)   Coronary artery disease of native heart with stable angina pectoris (HCC)   GERD   RLS (restless legs syndrome)   Chronic anemia   CKD (chronic kidney disease) stage 3, GFR 30-59 ml/min (HCC)   Hydronephrosis due to obstructive malignant neoplasm of bladder (HCC)   Elevated troponin   Urothelial carcinoma of bladder (HCC)   Dehydration   Thrombocytopathia (HCC)   Chronic systolic heart failure (HCC)   Lower urinary tract infectious disease   Hyponatremia   Agitation   AKI (acute kidney injury) ([place])   Hypokalemia   CAP (community acquired pneumonia)   Goals of care, counseling/discussion  Delirium Psych medications adjusted with psych, Dr Orlena Sheldon, assistance DC Saphris and paxil on 02/18/2019 Seroquel 25 mg twice daily and gabapentin 100 mg twice daily Continue Mirapex 2.5 mg twice daily Continue to closely monitor  Anemia of chronic disease Hemoglobin is stable post 2 unit PRBCs transfusion No sign of overt bleeding  Thrombocytopenia No active bleeding Platelet 33K from 27K yesterday   Acute on chronic systolic CHF Last 2D echo done on 02/16/2019 revealed LVEF 40-45% with severely dilated left atrium Continue strict I's and O's and daily weight Back on p.o. Lasix due to AKI  AKI on CKD 3 Baseline creatinine appears to be 1.6 Presented with creatinine of 2.49 Creatinine today 1.97 DC IV Lasix Continue p.o. Lasix 40 mg daily At home was on Lasix 40 mg 3 times daily Good urine output Net I&O's -4.5 L since admission  Bilateral pleural effusion worse on the right post right thoracentesis by IR Right  thoracentesis by IR Pathology fluid pending  Candidiuria Urine cx grew 50,000 yeast Negative blood cx x2, no growth 5 days No sign of leukopenia or neutropenia Has suprapubic cath in place Currently on p.o.  fluconazole Plan is to DC to home hospice  Hyperbilirubinemia Unclear etiology T bili 1.4 from 1.7 ALT trending down from 1 85-1 63-1 29  Hypokalemia resolved post repletion  . HTN (hypertension)blood pressure currently stable.  On p.o. Lasix 40 mg daily  . Hydronephrosis due to obstructive malignant neoplasm of bladder [facility])- -urology consulted. Appreciate input. -Recommendation for transition from nephroureteral catheters to antegrade stents in preparation for transition to hospice -stent placement done on 2/17  . Urothelial carcinoma of bladder (HCC)-was in the process of getting hospice set up.  -Palliative Care continuing to follow, appreciate input -Poor prognosis per oncology -Plan to DC home with hospice  . Hyperlipidemia LDL goal <70 -Noted to be chronic, presently stable  . Coronary artery disease of native heart with stable angina pectoris (HCC)denies chest pain  . GERDstable continue home medications  . RLS (restless legs syndrome)stable continue home medications  . Elevated troponinlikely demand ischemia.  Denies chest pain.  Peaked at 0.08.  Marland Kitchen  Resolved hyponatremia  CAP small infiltrate noted on chest x-ray, reviewed -Completed course of azithromycin and cefepime  Colostomy site with bulging colostomy. Patient have been known to remove his colostomy bag and pulled on the ostomy wife is concerned that he may cause damage. Admitting physician discussed with general surgery who at this point recommends applying abdominal binder not tightly but for the purposes of concealing the site so that the patient will be less likely to pull on it. -Seen and evaluated by General Surgery 2/17. Stable at this time  DVT prophylaxis: SCD's not on pharmacological DVT prophylaxis due to significant thrombocytopenia Code Status: DNR Family Communication:  None at bedside Disposition Plan:  Home with hospice versus residential hospice possibly tomorrow  02/20/2019  Consultants:   Urology  Palliative Care  General Surgery  Procedures:     Antimicrobials:             Anti-infectives (From admission, onward)     Start        Dose/Rate  Route  Frequency  Ordered  Stop     02/12/19 1100    ceFEPIme (MAXIPIME) 2 g in sodium chloride 0.9 % 100 mL IVPB       2 g  200 mL/hr over 30 Minutes  Intravenous  Every 12 hours  02/12/19 0834        02/11/19 2200    azithromycin (ZITHROMAX) tablet 500 mg       500 mg  Oral  Daily at bedtime  02/11/19 0956        02/11/19 1200    fluconazole (DIFLUCAN) tablet 150 mg       150 mg  Oral   Once  02/11/19 0959  02/11/19 1230     02/10/19 1700    azithromycin (ZITHROMAX) 500 mg in sodium chloride 0.9 % 250 mL IVPB  Status:  Discontinued       500 mg  250 mL/hr over 60 Minutes  Intravenous  Every 24 hours  02/10/19 1539  02/11/19 0956     02/10/19 1630    ceFEPIme (MAXIPIME) 2 g in sodium chloride 0.9 % 100 mL IVPB  Status:  Discontinued       2 g  200 mL/hr over 30 Minutes  Intravenous  Every 24 hours  02/10/19 1540  02/12/19 0834     02/09/19 1800    cefTRIAXone (ROCEPHIN) 1 g in sodium chloride 0.9 % 100 mL IVPB  Status:  Discontinued       1 g  200 mL/hr over 30 Minutes  Intravenous  Every 24 hours  02/08/19 2133  02/10/19 1537     02/08/19 1745    fluconazole (DIFLUCAN) tablet 150 mg  Status:  Discontinued       150 mg  Oral   Once  02/08/19 1731  02/12/19 0841     02/08/19 1715    cefTRIAXone (ROCEPHIN) 1 g in sodium chloride 0.9 % 100 mL IVPB       1 g  200 mL/hr over 30 Minutes  Intravenous   Once  02/08/19 1702  02/08/19 1855     02/08/19 1715    azithromycin (ZITHROMAX) 500 mg in sodium chloride 0.9 % 250 mL IVPB       500 mg  250 mL/hr over 60 Minutes  Intravenous   Once  02/08/19 1702  02/08/19 2058         Objective: Vitals:   02/18/19 0404 02/18/19  1352 02/18/19 2225 02/19/19 0605  BP: 129/89 95/71 99/68  95/63  Pulse: 96 87 81 85  Resp: 20 18 18 19   Temp: 98 F (36.7 C) 97.6 F (36.4 C) (!) 97.5 F (36.4 C) (!) 97.3 F (36.3 C)  TempSrc: Axillary Oral Oral Oral  SpO2: 100% 96% 100% 95%  Weight:      Height:        Intake/Output Summary (Last 24 hours) at 02/19/2019 1224 Last data filed at 02/19/2019 1047 Gross per 24 hour  Intake 740 ml  Output 1600 ml  Net -860 ml   Filed Weights   02/08/19 1444  Weight: 87.8 kg    Exam:  . General: 84 y.o. year-old male well-developed well-nourished somnolent but easily arousable to voices.  Does not appear to be in distress. . Cardiovascular: Regular rate and rhythm with no rubs or gallops.  No JVD or thyromegaly . Respiratory: Clear to auscultation with no wheezes or rales.  Good inspiratory effort.  Abdomen: Soft nontender nondistended with normal bowel sounds x4 quadrants. . Musculoskeletal: Trace edema in lower extremities bilaterally. 2/4 pulses in all 4 extremities. Marland Kitchen Psychiatry: Mood is appropriate for condition and setting   Data Reviewed: CBC: Recent Labs  Lab 02/13/19 0339 02/14/19 0828 02/15/19 0356 02/16/19 0340 02/17/19 0430  WBC 6.1 8.4 9.1 6.3 7.4  NEUTROABS  --   --   --  4.2  --   HGB 7.2* 8.0* 7.9* 6.5* 9.0*  HCT 23.7* 26.5* 26.3* 21.8* 28.8*  MCV 107.7* 107.7* 107.8* 107.4* 101.8*  PLT 33* 33* 32* 27* 33*   Basic Metabolic Panel: Recent Labs  Lab 02/13/19 0339 02/14/19 0828 02/15/19 0356 02/16/19 0340 02/17/19 0430  NA 137 137 137 137 137  K 4.6 4.8 4.6 3.4* 4.1  CL 109 109 109 109 107  CO2 23 20* 21* 21* 23  GLUCOSE 107* 111* 103* 99 111*  BUN 31* 35* 38* 39* 42*  CREATININE 1.65* 1.61* 1.76* 1.84* 1.97*  CALCIUM 9.1 9.4 9.3 8.4* 9.1  MG  --   --   --   --  2.1   GFR: Estimated Creatinine Clearance: 31.7 mL/min (A) (by C-G formula based on SCr of 1.97 mg/dL (H)). Liver Function Tests: Recent Labs  Lab 02/13/19 1308 02/14/19 0828  02/15/19 0356  AST 46* 43*  36  ALT 185* 163* 129*  ALKPHOS 67 68 63  BILITOT 1.5* 1.7* 1.4*  PROT 6.5 7.2 6.4*  ALBUMIN 2.9* 3.3* 2.9*   No results for input(s): LIPASE, AMYLASE in the last 168 hours. Recent Labs  Lab 02/13/19 1210  AMMONIA 13   Coagulation Profile: Recent Labs  Lab 02/13/19 1308  INR 1.31   Cardiac Enzymes: No results for input(s): CKTOTAL, CKMB, CKMBINDEX, TROPONINI in the last 168 hours. BNP (last 3 results) No results for input(s): PROBNP in the last 8760 hours. HbA1C: No results for input(s): HGBA1C in the last 72 hours. CBG: No results for input(s): GLUCAP in the last 168 hours. Lipid Profile: No results for input(s): CHOL, HDL, LDLCALC, TRIG, CHOLHDL, LDLDIRECT in the last 72 hours. Thyroid Function Tests: No results for input(s): TSH, T4TOTAL, FREET4, T3FREE, THYROIDAB in the last 72 hours. Anemia Panel: No results for input(s): VITAMINB12, FOLATE, FERRITIN, TIBC, IRON, RETICCTPCT in the last 72 hours. Urine analysis:    Component Value Date/Time   COLORURINE YELLOW 02/08/2019 1557   APPEARANCEUR CLOUDY (A) 02/08/2019 1557   LABSPEC 1.015 02/08/2019 1557   PHURINE 5.0 02/08/2019 1557   GLUCOSEU NEGATIVE 02/08/2019 1557   HGBUR LARGE (A) 02/08/2019 1557   HGBUR negative 10/15/2010 1251   BILIRUBINUR NEGATIVE 02/08/2019 1557   BILIRUBINUR n 12/01/2016 1521   KETONESUR NEGATIVE 02/08/2019 1557   PROTEINUR 30 (A) 02/08/2019 1557   UROBILINOGEN 0.2 12/01/2016 1521   UROBILINOGEN 0.2 10/15/2010 1251   NITRITE NEGATIVE 02/08/2019 1557   LEUKOCYTESUR LARGE (A) 02/08/2019 1557   Sepsis Labs: @LABRCNTIP (procalcitonin:4,lacticidven:4)  ) Recent Results (from the past 240 hour(s))  MRSA PCR Screening     Status: Abnormal   Collection Time: 02/09/19  4:00 PM  Result Value Ref Range Status   MRSA by PCR POSITIVE (A) NEGATIVE Final    Comment:        The GeneXpert MRSA Assay (FDA approved for NASAL specimens only), is one component of  a comprehensive MRSA colonization surveillance program. It is not intended to diagnose MRSA infection nor to guide or monitor treatment for MRSA infections. RESULT CALLED TO, READ BACK BY AND VERIFIED WITH: K.DUNKELBERGER AT 1911 ON 02/09/19 BY N.THOMPSON Performed at [facility], [place] [address] [place], [place] [address]   Body fluid culture     Status: None   Collection Time: 02/15/19  3:12 PM  Result Value Ref Range Status   Specimen Description   Final    PLEURAL RIGHT Performed at [place] [address] [place], [place] [address]    Special Requests   Final    NONE Performed at [facility], [place] [address] [place], [place] [address]    Gram Stain   Final    WBC PRESENT, PREDOMINANTLY MONONUCLEAR NO ORGANISMS SEEN CYTOSPIN SMEAR Performed at [facility], [place] [address], [place],  [address]    Culture NO GROWTH  Final   Report Status 02/19/2019 FINAL  Final  Acid Fast Smear (AFB)     Status: None   Collection Time: 02/15/19  3:12 PM  Result Value Ref Range Status   AFB Specimen Processing Concentration  Final   Acid Fast Smear Negative  Final    Comment: (NOTE) Performed At: [facility] [place], [place] [id number] Rush Farmer MD AU:[id number]    Source (AFB) RIGHT  Final    Comment: PLEURAL Performed at [facility], [place] [address]  [place], [place] [address]       Studies: No results found.  Scheduled Meds: . famotidine  20 mg Oral BID  . furosemide  40 mg Oral Daily  . gabapentin  100 mg Oral BID  . lactose free nutrition  237 mL Oral TID WC  . mirabegron ER  25 mg Oral Daily  . multivitamin with minerals  1 tablet Oral Daily  . pramipexole  2.5 mg Oral BID  . QUEtiapine  25 mg Oral BID    Continuous Infusions: . sodium chloride 250 mL (02/16/19 1740)     LOS: 10 days     Kayleen Memos, MD [facility] Pager  [phone]  If 7PM-7AM, please contact night-coverage [email or web address] Password [facility] 02/19/2019, 12:24 PM

## 2019-02-19 NOTE — Progress Notes (Signed)
Palliative care progress note  Reason for consult: Goals of care in light of his metastatic cancer  There is a Air cabin crew by the patient's bedside, he is confused, restless and trying to get out of bed. His wife is not at the bedside.      PPS 30%    BP 95/63 (BP Location: Left Arm)   Pulse 85   Temp (!) 97.3 F (36.3 C) (Oral)   Resp 19   Ht 5\' 8"  (1.727 m)   Wt 87.8 kg   SpO2 95%   BMI 29.43 kg/m  Labs and imaging noted Med history noted.   Awake alert   confused No distress Regular breathing No edema Non focal  - Dilaudid as needed.  - His wife is interested in hospice services at home, have request care management to proceed with hospice referral on 02-18-2019, how ever, this am, the patient is more confused, some what agitated and doesn't comprehend.    The patient might need durable medical equipment delivered and might need hospice arrangements completed before he is discharged, so as to ensure a safe disposition.    Ongoing trials of appropriate medications is underway. Repeat Psych eval recommendations noted.        - PMT to continue to follow for appropriate disposition options. Disposition remains limited due to patient's ongoing agitation. If there is ongoing decline, we may have to evaluate the patient for residential hospice.   Wife was in the process of making arrangements for sitters with [facility].     Total time: 25 minutes Greater than 50%  of this time was spent counseling and coordinating care related to the above assessment and plan.  Loistine Chance, MD [facility] [phone]

## 2019-02-20 ENCOUNTER — Telehealth: Payer: Self-pay | Admitting: Family Medicine

## 2019-02-20 LAB — CBC WITH DIFFERENTIAL/PLATELET
ABS IMMATURE GRANULOCYTES: 0.23 10*3/uL — AB (ref 0.00–0.07)
Basophils Absolute: 0 10*3/uL (ref 0.0–0.1)
Basophils Relative: 1 %
Eosinophils Absolute: 0.1 10*3/uL (ref 0.0–0.5)
Eosinophils Relative: 1 %
HCT: 21.8 % — ABNORMAL LOW (ref 39.0–52.0)
Hemoglobin: 6.5 g/dL — CL (ref 13.0–17.0)
Immature Granulocytes: 4 %
LYMPHS PCT: 17 %
Lymphs Abs: 1.1 10*3/uL (ref 0.7–4.0)
MCH: 32 pg (ref 26.0–34.0)
MCHC: 29.8 g/dL — ABNORMAL LOW (ref 30.0–36.0)
MCV: 107.4 fL — ABNORMAL HIGH (ref 80.0–100.0)
Monocytes Absolute: 0.8 10*3/uL (ref 0.1–1.0)
Monocytes Relative: 12 %
Neutro Abs: 4.2 10*3/uL (ref 1.7–7.7)
Neutrophils Relative %: 65 %
Platelets: 27 10*3/uL — CL (ref 150–400)
RBC: 2.03 MIL/uL — ABNORMAL LOW (ref 4.22–5.81)
RDW: 22.7 % — ABNORMAL HIGH (ref 11.5–15.5)
WBC: 6.3 10*3/uL (ref 4.0–10.5)
nRBC: 0 % (ref 0.0–0.2)

## 2019-02-20 LAB — TROPONIN I: Troponin I: 0.03 ng/mL (ref <0.03)

## 2019-02-20 MED ORDER — QUETIAPINE FUMARATE 25 MG PO TABS
25.0000 mg | ORAL_TABLET | Freq: Every day | ORAL | Status: DC
  Administered 2019-02-20: 25 mg via ORAL
  Filled 2019-02-20: qty 1

## 2019-02-20 MED ORDER — CAMPHOR-MENTHOL 0.5-0.5 % EX LOTN
TOPICAL_LOTION | CUTANEOUS | Status: DC | PRN
  Filled 2019-02-20: qty 222

## 2019-02-20 NOTE — Progress Notes (Signed)
Daily Progress Note   Patient Name: Charles Coffey       Date: 02/20/2019 DOB: 30-Jul-1935  Age: 84 y.o. MRN#: [id number] Attending Physician: Kayleen Memos, DO Primary Care Physician: [name], MD Admit Date: 02/08/2019  Reason for Consultation/Follow-up: Establishing goals of care  Subjective:  patient did not have agitation overnight. He rested fairly well, wife at bedside.    Length of Stay: 11  Current Medications: Scheduled Meds:  . famotidine  20 mg Oral BID  . furosemide  40 mg Oral Daily  . gabapentin  100 mg Oral BID  . lactose free nutrition  237 mL Oral TID WC  . mirabegron ER  25 mg Oral Daily  . multivitamin with minerals  1 tablet Oral Daily  . pramipexole  2.5 mg Oral BID  . QUEtiapine  25 mg Oral QHS    Continuous Infusions: . sodium chloride 250 mL (02/16/19 1740)    PRN Meds: sodium chloride, acetaminophen **OR** acetaminophen, alum & mag hydroxide-simeth, camphor-menthol, HYDROmorphone (DILAUDID) injection, hydrOXYzine, ondansetron **OR** ondansetron (ZOFRAN) IV, oxyCODONE-acetaminophen **AND** oxyCODONE, polyvinyl alcohol, sodium chloride flush  Physical Exam         Elderly gentleman He has dozed off in the chair Doesn't arouse to gentle stimulation Regular work of breathing No edema  Vital Signs: BP 106/64 (BP Location: Right Arm)   Pulse 87   Temp 97.7 F (36.5 C) (Oral)   Resp (!) 24   Ht 5\' 8"  (1.727 m)   Wt 87.8 kg   SpO2 97%   BMI 29.43 kg/m  SpO2: SpO2: 97 % O2 Device: O2 Device: Room Air O2 Flow Rate: O2 Flow Rate (L/min): 2 L/min  Intake/output summary:   Intake/Output Summary (Last 24 hours) at 02/20/2019 1254 Last data filed at 02/20/2019 0900 Gross per 24 hour  Intake 540 ml  Output 1901 ml  Net -1361 ml   LBM:  Last BM Date: 02/19/19(per colostomy) Baseline Weight: Weight: 87.8 kg Most recent weight: Weight: 87.8 kg      PPS 40% Palliative Assessment/Data:    Flowsheet Rows     Most Recent Value  Intake Tab  Referral Department  Hospitalist  Unit at Time of Referral  ER  Palliative Care Primary Diagnosis  Cancer  Date Notified  02/09/19  Palliative Care Type  Return patient Palliative Care  Reason for referral  Clarify Goals of Care  Date of Admission  02/08/19  Date first seen by Palliative Care  02/09/19  # of days Palliative referral response time  0 Day(s)  # of days IP prior to Palliative referral  1  Clinical Assessment  Psychosocial & Spiritual Assessment  Palliative Care Outcomes      Patient Active Problem List   Diagnosis Date Noted  . Goals of care, counseling/discussion   . CAP (community acquired pneumonia) 02/09/2019  . Palliative care encounter 02/09/2019  . Delirium 02/08/2019  . Hypokalemia 02/08/2019  . Acute exacerbation of CHF (congestive heart failure) ([place]) 11/25/2018  . Fatigue 11/25/2018  . Chronic pain 11/25/2018  . Depression 11/25/2018  . GERD (gastroesophageal reflux disease) 11/25/2018  . Status post colostomy ([place]) 11/25/2018  . Moderate episode of recurrent major depressive disorder ([place])   . Bacterial infection due to Proteus mirabilis 11/09/2018  . Sepsis ([place]) 11/09/2018  . AKI (acute kidney injury) ([place]) 11/09/2018  . UTI (urinary tract infection) 11/06/2018  . Cancer associated pain   . Palliative care by specialist   . DNR (do not resuscitate)   . Agitation   . Sepsis due to urinary tract infection ([place]) 08/19/2018  . Hyponatremia 08/19/2018  . Ventral hernia without obstruction or gangrene 07/21/2018  . Displacement of nephrostomy tube ([place]) 07/05/2018  . Nephrostomy tube displaced ([place]) 07/05/2018  . Malfunction of nephrostomy tube ([place])   . Lower urinary tract infectious disease 06/30/2018  . Port-A-Cath in place 03/31/2018  .  PVC (premature ventricular contraction) 02/25/2018  . Malignant neoplasm of urinary bladder ([place]) 02/25/2018  . Catheter-associated urinary tract infection ([place]) 02/17/2018  . Nephrostomy complication ([place]) 09/98/3382  . Chronic systolic heart failure ([place]) 02/07/2018  . Malnutrition of moderate degree 02/01/2018  . Acute respiratory failure with hypoxia ([place]) 01/31/2018  . Acute CHF (congestive heart failure) ([place]) 01/31/2018  . Thrombocytopathia ([place])   . DVT (deep venous thrombosis) ([place]) 01/11/2018  . Sepsis secondary to UTI ([place]) 01/01/2018  . Colostomy in place [facility]) 12/04/2017  . Rectal stenosis   . Low vitamin B12 level 11/30/2017  . Irritable bowel syndrome with constipation   . Pyelonephritis 11/28/2017  . Fever 11/28/2017  . Ileus ([place]) 11/28/2017  . Elevated troponin 11/28/2017  . Urothelial carcinoma of bladder ([place]) 11/28/2017  . Dehydration 11/28/2017  . Thrombocytopenia ([place]) 11/28/2017  . Hydronephrosis due to obstructive malignant neoplasm of bladder ([place]) 11/25/2017  . Chronic anemia 09/20/2017  . CKD (chronic kidney disease) stage 3, GFR 30-59 ml/min (HCC) 09/20/2017  . Angina pectoris ([place])   . Abnormal stress test   . Cervical spondylosis without myelopathy 02/08/202017  . PAD (peripheral artery disease) ([place]) 03/04/2015  . Coronary artery disease involving native heart without angina pectoris 11/01/2014  . Acid reflux 11/01/2014  . H/O male genital system disorder 11/01/2014  . RLS (restless legs syndrome) 11/01/2014  . Acne erythematosa 11/01/2014  . Syncope 09/24/2014  . Chronic low back pain 11/10/2013  . Ischemic cardiomyopathy 11/29/2012  . Hyperlipidemia LDL goal <70 10/15/2010  . RESTLESS LEGS SYNDROME 10/15/2010  . HTN (hypertension) 10/15/2010  . MYOCARDIAL INFARCTION, HX OF 10/15/2010  . Coronary artery disease of native heart with stable angina pectoris ([place]) 10/15/2010  . GERD 10/15/2010  . BENIGN PROSTATIC HYPERTROPHY 10/15/2010  . BENIGN  PROSTATIC HYPERTROPHY, WITH OBSTRUCTION 10/15/2010  . COLONIC POLYPS, HX OF 10/15/2010    Palliative Care Assessment & Plan   Patient Profile:  84 y.o.malewith medical history significant of CAD s/p CABG, GERD,metastatic bladder cancer status post suprapubic catheter, nephrostomy tube, mild dementia with sundowning, presented with agitation and homoccidal ideations.  Assessment:  delirium Agitation, psychosis History of CHF CKD Life limiting illness of urothelial carcinoma of bladder.  Has colostomy  Recommendations/Plan:   Continue psych meds as per psych recommendations.  Patient with reasonable PO intake, how ever, at times, he has been too sleepy and has missed his meals.  Ongoing efforts on the part of his wife, TRH MD, care management and CSW and PMT to come up with safest possible disposition.  [name] is most in favor of home with hospice with night time sitters. She tells me that the long term care insurance has approved of the patient having night time RN through [facility].  She is awaiting further discussions with hospice.  See below.   Code Status:    Code Status Orders  (From admission, onward)         Start     Ordered   02/08/19 2133  Do not attempt resuscitation (DNR)  Continuous    Question Answer Comment  In the event of cardiac or respiratory ARREST Do not call a "code blue"   In the event of cardiac or respiratory ARREST Do not perform Intubation, CPR, defibrillation or ACLS   In the event of cardiac or respiratory ARREST Use medication by any route, position, wound care, and other measures to relive pain and suffering. May use oxygen, suction and manual treatment of airway obstruction as needed for comfort.      02/08/19 2133        Code Status History    Date Active Date Inactive Code Status Order ID Comments User Context   11/25/2018 2118 11/26/2018 1640 DNR [id number]  Shela Leff, MD Inpatient   11/06/2018  2321 11/10/2018 1916 DNR [id number]  Colbert Ewing, MD ED   08/19/2018 0341 08/24/2018 1818 DNR [id number]  Vianne Bulls, MD Inpatient   07/05/2018 0824 07/06/2018 1611 DNR [id number]  Aline August, MD ED   06/30/2018 2318 07/04/2018 2143 Full Code [id number]  Jani Gravel, MD Inpatient   02/17/2018 0045 02/17/2018 2003 Full Code [id number]  Etta Quill, DO ED   01/31/2018 0911 02/05/2018 1516 Full Code [id number]  Phillips Grout, MD Inpatient   11/28/2017 2136 12/04/2017 2050 Full Code [id number]  Eugenie Filler, MD ED   11/25/2017 1510 11/26/2017 1608 Full Code [id number]  Irine Seal, MD Inpatient   08/25/2017 1845 08/26/2017 1906 Full Code [id number]  Wellington [place], MD Inpatient    Advance Directive Documentation     Most Recent Value  Type of Advance Directive  Healthcare Power of Attorney, Living will  Pre-existing out of facility DNR order (yellow form or pink MOST form)  Physician notified to receive inpatient order  "MOST" Form in Place?  -       Prognosis:   guarded.  Patient is hospice eligible in my opinion.   Discharge Planning:  Wife's ultimate wish is to take him home, she is accepting of hospice services at home. She has made arrangements with [facility] home health services to have an RN at night to assist her.   She is however, not opposed to finding out if the patient is going to be residential hospice eligible as well. Hospice consult pending.   Care plan was discussed with patient's wife.   Thank you for allowing the Palliative Medicine  Team to assist in the care of this patient.   Time In: 10 Time Out: 10.35 Total Time 35 Prolonged Time Billed  no       Greater than 50%  of this time was spent counseling and coordinating care related to the above assessment and plan.  Loistine Chance, MD [phone]  Please contact Palliative Medicine Team phone at [phone] for questions and concerns.

## 2019-02-20 NOTE — Telephone Encounter (Signed)
Copied from [place] [phone]. Topic: Quick Communication - See Telephone Encounter >> Feb 20, 2019  4:47 PM Rutherford Nail, NT wrote: CRM for notification. See Telephone encounter for: 02/20/19. Amber with [facility] calling and would like to know if Dr Sarajane Jews is willing to sign off on any Hospice orders that are needing to be signed? Please advise.  CB#: [phone]
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
## 2019-02-20 NOTE — Care Management Note (Signed)
Case Management Note  Patient Details  Name: Charles Coffey MRN: [id number] Date of Birth: 12-17-1935  Subjective/Objective:DNR.Referral for home hospice choice-spoke to spouse Charles Coffey Y#[phone]-[phone] choice-she has chosen [facility], systems for [employer] will assess-await outcome. pcp-Dr. Alysia Penna, dme needed:hospital bed,w/c,02,rw,bedside table. Spouse will transport home on own.                    Action/Plan:dc home w/hospice   Expected Discharge Date:  02/11/19               Expected Discharge Plan:  Home w Hospice Care  In-House Referral:     Discharge planning Services  CM Consult  Post Acute Care Choice:    Choice offered to:  Spouse  DME Arranged:    DME Agency:     HH Arranged:    [place] Agency:  Hospice and [place]  Status of Service:  In process, will continue to follow  If discussed at Long Length of Stay Meetings, dates discussed:    Additional Comments:  Dessa Phi, RN 02/20/2019, 3:34 PM

## 2019-02-20 NOTE — Progress Notes (Signed)
[place]   Notified by Juliann Pulse, RN case manager, of family request for [facility] services at home after discharge.  Chart and patient information under review by [facility] physician at this time and eligibility is pending.    Spoke with wife [name], confirmed interest, answered questions, provided emotional support, and information regarding hospice services and philosophy.      Plan is to discharge tomorrow by POV with wife.     Please send signed and completed DNR home with the patient.   Patient will need prescriptions for discharge comfort medications (if necessary).   DME needs discussed, patient currently has the following equipment in the home:   none   DME that will be needed:  Bed with rails, 3 in 1, walker, O2, wheelchair.  Wife is adamant that they do not need the equipment delivered prior to arriving at home.  She would prefer to take him home in their car, and receive the equipment together.     [facility] is aware of the above information.    Completed discharge summary should be faxed to [phone], once eligibility confirmed, and completed.     Please notify [place] at [phone] (if after [phone] call [phone]) when the patient is ready to leave the unit.    [facility] information given to wife   Above information shared with RN Case Manager Juliann Pulse ? ? Venia Carbon, RN, BSN [facility] Nurse Liaison

## 2019-02-20 NOTE — Progress Notes (Signed)
CRITICAL VALUE ALERT  Critical Value:  Troponin 0.03  Date & Time Notied:  02/20/19 0859  Provider Notified: Dr Nevada Crane via page  Orders Received/Actions taken: pending

## 2019-02-20 NOTE — Telephone Encounter (Signed)
Yes I can still be the primary care provider

## 2019-02-20 NOTE — Progress Notes (Signed)
CSW consult: Residential Hospice evaluation.  CSW reached out to [facility] w/ [facility] for a [facility] placement evaluation. She will follow up with the patient and family.    Kathrin Greathouse, Marlinda Mike, MSW Clinical Social Worker  [phone] 02/20/2019  2:59 PM

## 2019-02-20 NOTE — Telephone Encounter (Signed)
Dr. Fry please advise. Thanks  

## 2019-02-21 ENCOUNTER — Encounter: Payer: Self-pay | Admitting: General Practice

## 2019-02-21 MED ORDER — FAMOTIDINE 20 MG PO TABS: 20.0000 mg | ORAL_TABLET | Freq: Two times a day (BID) | ORAL | 0 refills | Status: AC

## 2019-02-21 MED ORDER — ADULT MULTIVITAMIN W/MINERALS CH: 1.0000 | ORAL_TABLET | Freq: Every day | ORAL | 0 refills | Status: AC

## 2019-02-21 MED ORDER — BOOST PLUS PO LIQD: 237.0000 mL | Freq: Three times a day (TID) | ORAL | 0 refills | Status: AC

## 2019-02-21 MED ORDER — PRAMIPEXOLE DIHYDROCHLORIDE 0.5 MG PO TABS: 2.5000 mg | ORAL_TABLET | Freq: Two times a day (BID) | ORAL | 0 refills | Status: DC

## 2019-02-21 MED ORDER — GABAPENTIN 100 MG PO CAPS: 100.0000 mg | ORAL_CAPSULE | Freq: Two times a day (BID) | ORAL | 0 refills | Status: DC

## 2019-02-21 MED ORDER — HEPARIN SOD (PORK) LOCK FLUSH 100 UNIT/ML IV SOLN
500.0000 [IU] | INTRAVENOUS | Status: AC | PRN
  Administered 2019-02-21: 500 [IU]

## 2019-02-21 MED ORDER — CAMPHOR-MENTHOL 0.5-0.5 % EX LOTN: TOPICAL_LOTION | CUTANEOUS | 0 refills | Status: DC | PRN

## 2019-02-21 MED ORDER — FUROSEMIDE 40 MG PO TABS: 40.0000 mg | ORAL_TABLET | Freq: Every day | ORAL | 0 refills | Status: AC

## 2019-02-21 MED ORDER — QUETIAPINE FUMARATE 25 MG PO TABS: 25.0000 mg | ORAL_TABLET | Freq: Every day | ORAL | 0 refills | Status: AC

## 2019-02-21 NOTE — Progress Notes (Signed)
[facility] Spiritual Care Note  Followed up with Charles Coffey and wife Charles Coffey at the close of Charles Coffey's inpatient stay, providing pastoral presence, empathic listening, normalization of feelings, and emotional support regarding their myriad recent stressors. They were delighted and relieved to receive a visit from a familiar supporter. Charles Coffey is still experiencing some confusion and mis-remembering of details. Charles Coffey has been experiencing a peak in caregiver distress. Both value chaplain's listening. We plan to f/u by phone later in the week, once they are settled at home with hospice and [facility] aides' support.   [place], [place] Pager [phone] Voicemail [phone]

## 2019-02-21 NOTE — Discharge Summary (Addendum)
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 removed. Samples were sent to the laboratory as requested by the clinical team. IMPRESSION: Successful ultrasound guided diagnostic and therapeutic right thoracentesis yielding 950 cc of pleural fluid. Read by: Rowe Robert, PA-C Electronically Signed   By: Markus Daft M.D.   On: 02/15/2019 15:57    Microbiology: Recent Results (from the past 240 hour(s))  Body fluid culture     Status: None   Collection Time: 02/15/19  3:12 PM  Result Value Ref Range Status   Specimen Description   Final    PLEURAL RIGHT Performed at [facility], [place] [address], [place], [place] [address]    Special Requests   Final    NONE Performed at [facility], [place] [address], [place], [place] [address]    Gram Stain   Final    WBC PRESENT, PREDOMINANTLY MONONUCLEAR NO ORGANISMS SEEN CYTOSPIN SMEAR Performed at [facility], [place] [address] [place], [place] [address]    Culture NO GROWTH  Final   Report Status 02/19/2019 FINAL  Final  Acid Fast Smear (AFB)     Status: None   Collection Time: 02/15/19  3:12 PM  Result Value Ref  Range Status   AFB Specimen Processing Concentration  Final   Acid Fast Smear Negative  Final    Comment: (NOTE) Performed At: [facility] [place], [place] [id number] Rush Farmer MD [place]:[id number]    Source (AFB) RIGHT  Final    Comment: PLEURAL Performed at [facility], [place] [address], [place], [place] [address]      Labs: Basic Metabolic Panel: Recent Labs  Lab 02/15/19 0356 02/16/19 0340 02/17/19 0430 02/19/19 1300  NA 137 137 137 139  K 4.6 3.4* 4.1 3.7  CL 109 109 107 108  CO2 21*  21* 23 25  GLUCOSE 103* 99 111* 122*  BUN 38* 39* 42* 37*  CREATININE 1.76* 1.84* 1.97* 1.89*  CALCIUM 9.3 8.4* 9.1 9.0  MG  --   --  2.1 2.1  PHOS  --   --   --  3.6   Liver Function Tests: Recent Labs  Lab 02/15/19 0356 02/19/19 1300  AST 36 25  ALT 129* 57*  ALKPHOS 63 68  BILITOT 1.4* 0.5  PROT 6.4* 6.4*  ALBUMIN 2.9* 2.9*   No results for input(s): LIPASE, AMYLASE in the last 168 hours. No results for input(s): AMMONIA in the last 168 hours. CBC: Recent Labs  Lab 02/15/19 0356 02/16/19 0340 02/17/19 0430 02/19/19 1300  WBC 9.1 6.3 7.4 4.0  NEUTROABS  --  4.2  --   --   HGB 7.9* 6.5* 9.0* 8.0*  HCT 26.3* 21.8* 28.8* 26.5*  MCV 107.8* 107.4* 101.8* 105.6*  PLT 32* 27* 33* 32*   Cardiac Enzymes: Recent Labs  Lab 02/20/19 0808  TROPONINI 0.03*   BNP: BNP (last 3 results) Recent Labs    11/25/18 1634 02/15/19 0356  BNP 2,117.1* 2,967.6*    ProBNP (last 3 results) No results for input(s): PROBNP in the last 8760 hours.  CBG: No results for input(s): GLUCAP in the last 168 hours.     Signed:  Kayleen Memos, MD [facility] 02/21/2019, 12:45 PM

## 2019-02-21 NOTE — Telephone Encounter (Signed)
Called the office and Luetta Nutting is out of the office at this time but they will let her know that Dr. Sarajane Jews is willing to be the primary care provider for the pt.

## 2019-02-21 NOTE — Discharge Instructions (Signed)
Shortness of Breath, Adult  Shortness of breath means you have trouble breathing. Shortness of breath could be a sign of a medical problem.  Follow these instructions at home:     Watch for any changes in your symptoms.   Do not use any products that contain nicotine or tobacco, such as cigarettes, e-cigarettes, and chewing tobacco.   Do not smoke. Smoking can cause shortness of breath. If you need help to quit smoking, ask your doctor.   Avoid things that can make it harder to breathe, such as:  ? Mold.  ? Dust.  ? Air pollution.  ? Chemical smells.  ? Things that can cause allergy symptoms (allergens), if you have allergies.   Keep your living space clean. Use products that help remove mold and dust.   Rest as needed. Slowly return to your normal activities.   Take over-the-counter and prescription medicines only as told by your doctor. This includes oxygen therapy and inhaled medicines.   Keep all follow-up visits as told by your doctor. This is important.  Contact a doctor if:   Your condition does not get better as soon as expected.   You have a hard time doing your normal activities, even after you rest.   You have new symptoms.  Get help right away if:   Your shortness of breath gets worse.   You have trouble breathing when you are resting.   You feel light-headed or you pass out (faint).   You have a cough that is not helped by medicines.   You cough up blood.   You have pain with breathing.   You have pain in your chest, arms, shoulders, or belly (abdomen).   You have a fever.   You cannot walk up stairs.   You cannot exercise the way you normally do.  These symptoms may represent a serious problem that is an emergency. Do not wait to see if the symptoms will go away. Get medical help right away. Call your local emergency services (911 in the [place] Do not drive yourself to the hospital.  Summary   Shortness of breath is when you have trouble breathing enough air. It can be a sign of a  medical problem.   Avoid things that make it hard for you to breathe, such as smoking, pollution, mold, and dust.   Watch for any changes in your symptoms. Contact your doctor if you do not get better or you get worse.  This information is not intended to replace advice given to you by your health care provider. Make sure you discuss any questions you have with your health care provider.  Document Released: 06/01/2008 Document Revised: 05/16/2018 Document Reviewed: 05/16/2018  Elsevier Interactive Patient Education  2019 Elsevier Inc.

## 2019-02-26 DIAGNOSIS — Z933 Colostomy status: Secondary | ICD-10-CM | POA: Diagnosis not present

## 2019-02-26 DIAGNOSIS — K624 Stenosis of anus and rectum: Secondary | ICD-10-CM | POA: Diagnosis not present

## 2019-02-27 ENCOUNTER — Encounter: Payer: Self-pay | Admitting: Family Medicine

## 2019-02-27 DIAGNOSIS — G4730 Sleep apnea, unspecified: Secondary | ICD-10-CM

## 2019-02-28 NOTE — Telephone Encounter (Signed)
Dr. Fry please advise. Thanks  

## 2019-03-01 NOTE — Telephone Encounter (Signed)
I referred him to Pulmonary to set up a home sleep test

## 2019-03-02 ENCOUNTER — Telehealth: Payer: Self-pay | Admitting: Family Medicine

## 2019-03-02 NOTE — Telephone Encounter (Signed)
Dr. Fry please advise. Thanks  

## 2019-03-02 NOTE — Telephone Encounter (Signed)
Copied from [place] [id number]. Topic: Quick Communication - See Telephone Encounter >> Mar 02, 2019 11:54 AM [name], NT wrote: CRM for notification. See Telephone encounter for: 03/02/19. Megan, Hospice nurse states she went to see the pt today to change his suprapubic cathter due to the pt had almost pulled it out. CB#: [phone]

## 2019-03-03 NOTE — Telephone Encounter (Signed)
noted 

## 2019-03-13 ENCOUNTER — Telehealth: Payer: Self-pay

## 2019-03-13 DIAGNOSIS — G4730 Sleep apnea, unspecified: Secondary | ICD-10-CM

## 2019-03-13 NOTE — Telephone Encounter (Signed)
There is a patient message dated 02/27/19 stating the patient was referred to pulmonary to set up a home sleep test.  Patient has not been set up for any appointments at this time.  Message routed to [facility] Gay Filler "[facility] [place]) to advise if this can be set up.  Libby, please see above and advise. Can this be done without patient having been seen here? Patient in hospice. Thank you much as always.

## 2019-03-13 NOTE — Telephone Encounter (Signed)
Called office stating Dr. Sarajane Jews placed a order to have the patient do a home sleep study but he is on hospice and has never been seen in the office and physically cannot make it into the office but was told her husband can still have the home sleep test done. She would like a call back to get this scheduled.   Call back # [phone]

## 2019-03-14 NOTE — Telephone Encounter (Signed)
Order has been placed for the pt to have the home sleep test done.

## 2019-03-14 NOTE — Telephone Encounter (Signed)
I do not see an order in for a home sleep study nor do I have any kind of notification from that office to do one it will have to be precerted by dr fry's office then they will need to send me a staff message to do it after the precert has been done [name]

## 2019-03-14 NOTE — Telephone Encounter (Signed)
Spoke with patient's wife, advised her that the order for the patient to have a home sleep study only was placed incorrectly by Dr. Barbie Banner office. There is a referral but the way the order is written, its for an office visit.   Will route this to Dr. Barbie Banner office to see if they can place the home sleep test.

## 2019-03-16 ENCOUNTER — Ambulatory Visit (HOSPITAL_COMMUNITY)
Admission: RE | Admit: 2019-03-16 | Discharge: 2019-03-16 | Disposition: A | Discharge status: Home / Self Care | Source: Ambulatory Visit | Attending: Urology | Admitting: Urology

## 2019-03-16 ENCOUNTER — Other Ambulatory Visit: Payer: Self-pay

## 2019-03-16 ENCOUNTER — Encounter (HOSPITAL_COMMUNITY): Payer: Self-pay | Admitting: Interventional Radiology

## 2019-03-16 DIAGNOSIS — R339 Retention of urine, unspecified: Secondary | ICD-10-CM | POA: Insufficient documentation

## 2019-03-16 DIAGNOSIS — Z466 Encounter for fitting and adjustment of urinary device: Secondary | ICD-10-CM | POA: Diagnosis not present

## 2019-03-16 DIAGNOSIS — C689 Malignant neoplasm of urinary organ, unspecified: Secondary | ICD-10-CM | POA: Insufficient documentation

## 2019-03-16 DIAGNOSIS — C662 Malignant neoplasm of left ureter: Secondary | ICD-10-CM

## 2019-03-16 DIAGNOSIS — N1330 Unspecified hydronephrosis: Secondary | ICD-10-CM

## 2019-03-16 HISTORY — PX: IR CATHETER TUBE CHANGE: IMG717

## 2019-03-16 MED ORDER — IOHEXOL 300 MG/ML  SOLN
50.0000 mL | Freq: Once | INTRAMUSCULAR | Status: AC | PRN
  Administered 2019-03-16: 5 mL

## 2019-03-16 MED ORDER — LIDOCAINE VISCOUS HCL 2 % MT SOLN
OROMUCOSAL | Status: AC
  Filled 2019-03-16: qty 15

## 2019-03-16 NOTE — Procedures (Signed)
16 Fr suprapubic catheter replacement EBL 0 Comp 0

## 2019-03-17 ENCOUNTER — Ambulatory Visit: Payer: Self-pay

## 2019-03-17 ENCOUNTER — Inpatient Hospital Stay: Payer: Medicare HMO

## 2019-03-17 ENCOUNTER — Other Ambulatory Visit: Payer: Self-pay | Admitting: Oncology

## 2019-03-17 ENCOUNTER — Inpatient Hospital Stay: Payer: Medicare HMO | Admitting: Oncology

## 2019-03-17 DIAGNOSIS — C679 Malignant neoplasm of bladder, unspecified: Secondary | ICD-10-CM

## 2019-03-17 DIAGNOSIS — G893 Neoplasm related pain (acute) (chronic): Secondary | ICD-10-CM

## 2019-03-17 DIAGNOSIS — M545 Low back pain, unspecified: Secondary | ICD-10-CM

## 2019-03-17 DIAGNOSIS — G8929 Other chronic pain: Secondary | ICD-10-CM

## 2019-03-17 MED ORDER — OXYCODONE-ACETAMINOPHEN 10-325 MG PO TABS: 1.0000 | ORAL_TABLET | ORAL | 0 refills | Status: DC | PRN

## 2019-03-17 MED ORDER — CIPROFLOXACIN HCL 500 MG PO TABS: 500.0000 mg | ORAL_TABLET | Freq: Two times a day (BID) | ORAL | 0 refills | Status: DC

## 2019-03-17 NOTE — Telephone Encounter (Signed)
We will send in 7 days of Cipro

## 2019-03-17 NOTE — Telephone Encounter (Signed)
Called and lmom for the pts wife to make her aware of the abx that was sent to the pharmacy for the pts UTI.

## 2019-03-17 NOTE — Addendum Note (Signed)
Addended by: Alysia Penna A on: 03/17/2019 05:06 PM   Modules accepted: Orders

## 2019-03-17 NOTE — Telephone Encounter (Signed)
Seth Bake, Hospice Nurse called and says the patient has his catheter replaced yesterday and the wife reported during the night the patient's urine was cloudy and he had a temp of 100. She says today when she arrived, his urine was clear and he had a normal temp 97.6. She says the wife was concerned that he may have a UTI and would like some medication called in for him. She was not with the patient at the time of this call and says she told the wife she would call. I advised I will send to Dr. Sarajane Jews and someone will call the patient's wife with the recommendation.  Reason for Disposition . Cloudy urine, present < 24 hours  Answer Assessment - Initial Assessment Questions 1. SYMPTOMS: "What symptoms are you concerned about?"     Cloudy now clear urine 2. ONSET:  "When did the symptoms start?"     During the night 3. FEVER: "Is there a fever?" If so, ask: "What is the temperature, how was it measured, and when did it start?"     100 during night, now 97.6 4. ABDOMINAL PAIN: "Is there any abdominal pain?" (e.g., Scale 1-10; or mild, moderate, severe)     N/A 5. URINE COLOR: "What color is the urine?"  "Is there blood present in the urine?" (e.g., clear, yellow, cloudy, tea-colored, blood streaks, bright red)     Was reported cloudy, now clear 6. ONSET: "When was the catheter inserted?"     Reinserted Suprapubic yesterday by IR 7. OTHER SYMPTOMS: "Do you have any other symptoms?" (e.g., back pain, bad urine odor)      N/A 8. PREGNANCY: "Is there any chance you are pregnant?" "When was your last menstrual period?"     N/A  Protocols used: URINARY CATHETER SYMPTOMS AND QUESTIONS-A-AH

## 2019-03-20 ENCOUNTER — Telehealth: Payer: Self-pay | Admitting: Family Medicine

## 2019-03-20 ENCOUNTER — Ambulatory Visit (HOSPITAL_COMMUNITY): Payer: Medicare HMO

## 2019-03-20 ENCOUNTER — Other Ambulatory Visit (HOSPITAL_COMMUNITY): Payer: Medicare HMO

## 2019-03-20 MED ORDER — LIDOCAINE VISCOUS HCL 2 % MT SOLN: OROMUCOSAL | 1 refills | Status: DC

## 2019-03-20 NOTE — Telephone Encounter (Signed)
Copied from [place] [id number]. Topic: Quick Communication - Rx Refill/Question >> Mar 20, 2019  1:09 PM Alanda Slim E wrote: Medication: Pt had a tylenol tab sitting under tongue for over 24hrs and now has two small burn areas in mouth on gums and needs some kind of topical medication to help with the inflammation and pain or to numb or protect it / please advise   Has the patient contacted their pharmacy? No   Preferred Pharmacy (with phone number or street name): [id number] [place], [place] [phone] (Phone) [phone] (Fax)    Agent: Please be advised that RX refills may take up to 3 business days. We ask that you follow-up with your pharmacy.

## 2019-03-20 NOTE — Telephone Encounter (Signed)
I attempted to call Cae but the VM is full.  I have sent the rx to the pharmacy to use for his mouth.

## 2019-03-20 NOTE — Telephone Encounter (Signed)
Call in 2% viscous lidocaine to swish 3 teaspoons in the mouth every 4 hours prn, 100 ml with one rf

## 2019-03-20 NOTE — Telephone Encounter (Signed)
Dr. Fry please advise. Thanks  

## 2019-04-01 LAB — ACID FAST CULTURE WITH REFLEXED SENSITIVITIES (MYCOBACTERIA): Acid Fast Culture: NEGATIVE

## 2019-04-20 ENCOUNTER — Telehealth: Payer: Self-pay | Admitting: Internal Medicine

## 2019-04-20 NOTE — Telephone Encounter (Signed)
Charles Coffey has this order will have her call pt in the morning [name]

## 2019-04-21 NOTE — Telephone Encounter (Signed)
Per spouse, pt is now under hospice care so HST is no longer needed.

## 2019-04-28 ENCOUNTER — Ambulatory Visit (INDEPENDENT_AMBULATORY_CARE_PROVIDER_SITE_OTHER): Payer: Medicare Other | Admitting: Family Medicine

## 2019-04-28 ENCOUNTER — Other Ambulatory Visit: Payer: Self-pay

## 2019-04-28 ENCOUNTER — Encounter: Payer: Self-pay | Admitting: Family Medicine

## 2019-04-28 DIAGNOSIS — R451 Restlessness and agitation: Secondary | ICD-10-CM

## 2019-04-28 DIAGNOSIS — Z515 Encounter for palliative care: Secondary | ICD-10-CM

## 2019-04-28 DIAGNOSIS — R21 Rash and other nonspecific skin eruption: Secondary | ICD-10-CM

## 2019-04-28 DIAGNOSIS — G2581 Restless legs syndrome: Secondary | ICD-10-CM

## 2019-04-28 DIAGNOSIS — C679 Malignant neoplasm of bladder, unspecified: Secondary | ICD-10-CM

## 2019-04-28 DIAGNOSIS — R410 Disorientation, unspecified: Secondary | ICD-10-CM

## 2019-04-28 DIAGNOSIS — N12 Tubulo-interstitial nephritis, not specified as acute or chronic: Secondary | ICD-10-CM

## 2019-04-28 DIAGNOSIS — M545 Low back pain, unspecified: Secondary | ICD-10-CM

## 2019-04-28 DIAGNOSIS — G8929 Other chronic pain: Secondary | ICD-10-CM

## 2019-04-28 DIAGNOSIS — R600 Localized edema: Secondary | ICD-10-CM

## 2019-04-28 MED ORDER — PAROXETINE HCL 10 MG PO TABS: 10.0000 mg | ORAL_TABLET | Freq: Every day | ORAL | 0 refills | Status: AC

## 2019-04-28 MED ORDER — TRIAMCINOLONE ACETONIDE 0.1 % EX CREA: 1.0000 "application " | TOPICAL_CREAM | Freq: Two times a day (BID) | CUTANEOUS | 0 refills | Status: AC

## 2019-04-28 MED ORDER — PRAMIPEXOLE DIHYDROCHLORIDE 0.5 MG PO TABS: 1.5000 mg | ORAL_TABLET | Freq: Every day | ORAL | 0 refills | Status: AC

## 2019-04-28 MED ORDER — POLYETHYLENE GLYCOL 3350 17 GM/SCOOP PO POWD: 17.0000 g | Freq: Every day | ORAL | 1 refills | Status: AC

## 2019-04-28 MED ORDER — CHLORPROMAZINE HCL 50 MG PO TABS: 100.0000 mg | ORAL_TABLET | Freq: Every day | ORAL | 0 refills | Status: AC

## 2019-04-28 NOTE — Progress Notes (Signed)
Subjective:    Patient ID: Charles Coffey, male    DOB: October 12, 1935, 84 y.o.   MRN: [id number]  HPI Virtual Visit via Video Note  I connected with the patient on 04/28/19 at  1:30 PM EDT by a video enabled telemedicine application and verified that I am speaking with the correct person using two identifiers.  Location patient: home Location provider:work or home office Persons participating in the virtual visit: patient, provider  I discussed the limitations of evaluation and management by telemedicine and the availability of in person appointments. The patient expressed understanding and agreed to proceed.   HPI: Here to follow up on numerous issues. I spoke to both the patient and his wife, Charles Coffey. He has had a very difficult 3 months. He has bladder cancer and is in the Hospice program. Hospice workers come by the home 2-3 times a week. He has had great difficulty with his mental status changes, and he tends to be very sedated and sleepy all day. He often has trouble sleeping at night, and he often sundowns with confusion and agitation. He is currently taking a total of 100 mg of Thorazine at bedtime. This has helped him to sleep an dhas decreased the agitation. He was started on Gabapentin in the hospital for his restless legs, but this is no longer a problem. This seems to make him very sedated during the daytime. His nephrostomy tubes have been replaced by stents so this source of pain has been eliminated. His nutrition has improved per his wife. He still has bilateral leg swelling and he tries to keep the legs elevated as much as he can tolerate. He had been wearing compression stockings but his skin was breaking down and forming rashes. The home health aids used to apply Sarna lotion to the legs, but now they have switched to using Triamcinolone cream.    ROS: See pertinent positives and negatives per HPI.  Past Medical History:  Diagnosis Date  . Aortic atherosclerosis ([facility])   .  BPH with urinary obstruction   . CAD (coronary artery disease)    a.  MI 1995, CABG x 3 2002 (patient says that he had LIMA and RIMA grafts). b. ETT-Cardiolite (10/15) with EF 48%, apical scar, no ischemia. c. Nuc 07/2017 abnormal -> cath was performed,  patent LIMA to LAD and SVG to OM 2. RIMA to RCA is atretic. The right coronary artery is occluded distally with extensive collaterals from the LAD, medical therapy.   . Cancer [facility])    bladder cancer  . Cervical spondylosis without myelopathy August 22, 202017  . Chronic low back pain    sees Dr. Kary Kos   . GERD (gastroesophageal reflux disease)   . Hiatal hernia   . Hyperlipidemia   . Hypertension   . IBS (irritable bowel syndrome)   . Ischemic cardiomyopathy   . Myocardial infarction ([place])   . RBBB   . Restless legs syndrome   . Statin intolerance   . Syncope    a. in 2015 - no apparent cause, was taking sleep medicine at the time. Cardiac workup unremarkable.  . Tubular adenoma of colon     Past Surgical History:  Procedure Laterality Date  . COLONOSCOPY  10/17/2014   per Dr. Hilarie Fredrickson, tubular adenomas, repeat in 3 yrs   . COLONOSCOPY WITH PROPOFOL N/A 12/01/2017   Procedure: COLONOSCOPY WITH PROPOFOL;  Surgeon: Milus Banister, MD;  Location: [facility];  Service: Endoscopy;  Laterality: N/A;  . CYSTOSCOPY W/  RETROGRADES Left 11/25/2017   Procedure: CYSTOSCOPY WITH RETROGRADE PYELOGRAM;  Surgeon: Irine Seal, MD;  Location: [facility];  Service: Urology;  Laterality: Left;  . HEART BYPASS    . IR CATHETER TUBE CHANGE  11/08/2018  . IR CATHETER TUBE CHANGE  12/26/2018  . IR CATHETER TUBE CHANGE  01/08/2019  . IR CATHETER TUBE CHANGE  01/13/2019  . IR CATHETER TUBE CHANGE  03/16/2019  . IR CONVERT LEFT NEPHROSTOMY TO NEPHROURETERAL CATH  09/20/2018  . IR CONVERT RIGHT NEPHROSTOMY TO NEPHROURETERAL CATH  02/04/2018  . IR CONVERT RIGHT NEPHROSTOMY TO NEPHROURETERAL CATH  08/23/2018  . IR EXT NEPHROURETERAL CATH EXCHANGE  04/11/2018  . IR EXT  NEPHROURETERAL CATH EXCHANGE  07/04/2018  . IR EXT NEPHROURETERAL CATH EXCHANGE  09/20/2018  . IR EXT NEPHROURETERAL CATH EXCHANGE  11/08/2018  . IR EXT NEPHROURETERAL CATH EXCHANGE  11/08/2018  . IR EXT NEPHROURETERAL CATH EXCHANGE  12/26/2018  . IR EXT NEPHROURETERAL CATH EXCHANGE  12/26/2018  . IR EXT NEPHROURETERAL CATH EXCHANGE  01/17/2019  . IR FLUORO GUIDE CV LINE RIGHT  12/27/2017  . IR FLUORO GUIDED NEEDLE PLC ASPIRATION/INJECTION LOC  08/19/2018  . IR NEPHROSTOGRAM LEFT THRU EXISTING ACCESS  12/24/2017  . IR NEPHROSTOMY EXCHANGE LEFT  01/28/2018  . IR NEPHROSTOMY EXCHANGE LEFT  02/04/2018  . IR NEPHROSTOMY EXCHANGE LEFT  02/25/2018  . IR NEPHROSTOMY EXCHANGE LEFT  07/04/2018  . IR NEPHROSTOMY EXCHANGE LEFT  07/05/2018  . IR NEPHROSTOMY EXCHANGE LEFT  08/09/2018  . IR NEPHROSTOMY EXCHANGE LEFT  08/16/2018  . IR NEPHROSTOMY EXCHANGE LEFT  08/23/2018  . IR NEPHROSTOMY EXCHANGE RIGHT  12/24/2017  . IR NEPHROSTOMY EXCHANGE RIGHT  01/28/2018  . IR NEPHROSTOMY EXCHANGE RIGHT  04/11/2018  . IR NEPHROSTOMY EXCHANGE RIGHT  07/05/2018  . IR NEPHROSTOMY EXCHANGE RIGHT  08/09/2018  . IR NEPHROSTOMY EXCHANGE RIGHT  08/19/2018  . IR NEPHROSTOMY PLACEMENT LEFT  11/28/2017  . IR NEPHROSTOMY PLACEMENT RIGHT  12/03/2017  . IR PATIENT EVAL TECH 0-60 MINS  03/24/2018  . IR PATIENT EVAL TECH 0-60 MINS  03/28/2018  . IR PATIENT EVAL TECH 0-60 MINS  04/25/2018  . IR PATIENT EVAL TECH 0-60 MINS  06/20/2018  . IR URETERAL STENT PLACEMENT EXISTING ACCESS LEFT  02/13/2019  . IR URETERAL STENT PLACEMENT EXISTING ACCESS RIGHT  02/13/2019  . IR US GUIDE VASC ACCESS RIGHT  12/27/2017  . LAPAROSCOPY N/A 12/01/2017   Procedure: LAPAROSCOPIC DIVERTING OSTOMY;  Surgeon: Stark Klein, MD;  Location: [facility];  Service: General;  Laterality: N/A;  . LEFT HEART CATH AND CORS/GRAFTS ANGIOGRAPHY N/A 08/25/2017   Procedure: LEFT HEART CATH AND CORS/GRAFTS ANGIOGRAPHY;  Surgeon: Wellington Hampshire, MD;  Location: [facility] [place] [facility];  Service:  Cardiovascular;  Laterality: N/A;  . LUMBAR FUSION  2003   L3-L4  . PROSTATE SURGERY  06-27-12   per Dr. Roni Bread, had CTT  . TONSILLECTOMY    . TRANSURETHRAL RESECTION OF BLADDER TUMOR N/A 11/25/2017   Procedure: TRANSURETHRAL RESECTION OF BLADDER TUMOR (TURBT);  Surgeon: Irine Seal, MD;  Location: [facility];  Service: Urology;  Laterality: N/A;    Family History  Problem Relation Age of Onset  . Heart attack Mother   . Aneurysm Father        femoral artery  . Heart disease Brother   . Heart disease Maternal Uncle        x 2  . Colon cancer Neg Hx   . Esophageal cancer Neg Hx   . Pancreatic  cancer Neg Hx   . Kidney disease Neg Hx   . Liver disease Neg Hx      Current Outpatient Medications:  .  chlorproMAZINE (THORAZINE) 50 MG tablet, Take 2 tablets (100 mg total) by mouth at bedtime., Disp: 2 tablet, Rfl: 0 .  famotidine (PEPCID) 20 MG tablet, Take 1 tablet (20 mg total) by mouth 2 (two) times daily., Disp: 180 tablet, Rfl: 0 .  feeding supplement (BOOST HIGH PROTEIN) LIQD, Take 1 Container by mouth 2 (two) times daily between meals., Disp: , Rfl:  .  furosemide (LASIX) 40 MG tablet, Take 1 tablet (40 mg total) by mouth daily., Disp: 30 tablet, Rfl: 0 .  lactose free nutrition (BOOST PLUS) LIQD, Take 237 mLs by mouth 3 (three) times daily with meals., Disp: 10 Can, Rfl: 0 .  Multiple Vitamin (MULTIVITAMIN WITH MINERALS) TABS tablet, Take 1 tablet by mouth daily., Disp: 30 tablet, Rfl: 0 .  oxyCODONE-acetaminophen (PERCOCET) 10-325 MG tablet, Take 1 tablet by mouth every 4 (four) hours as needed for pain., Disp: 90 tablet, Rfl: 0 .  PARoxetine (PAXIL) 10 MG tablet, Take 1 tablet (10 mg total) by mouth daily., Disp: 1 tablet, Rfl: 0 .  polyethylene glycol powder (GLYCOLAX/MIRALAX) 17 GM/SCOOP powder, Take 17 g by mouth daily., Disp: 3350 g, Rfl: 1 .  pramipexole (MIRAPEX) 0.5 MG tablet, Take 3 tablets (1.5 mg total) by mouth at bedtime., Disp: 240 tablet, Rfl: 0 .  QUEtiapine (SEROQUEL)  25 MG tablet, Take 1 tablet (25 mg total) by mouth at bedtime., Disp: 60 tablet, Rfl: 0 .  triamcinolone cream (KENALOG) 0.1 %, Apply 1 application topically 2 (two) times daily., Disp: 30 g, Rfl: 0  EXAM:  VITALS per patient if applicable:  GENERAL: alert, oriented, appears well and in no acute distress  HEENT: atraumatic, conjunttiva clear, no obvious abnormalities on inspection of external nose and ears  NECK: normal movements of the head and neck  LUNGS: on inspection no signs of respiratory distress, breathing rate appears normal, no obvious gross SOB, gasping or wheezing  CV: no obvious cyanosis  MS: moves all visible extremities without noticeable abnormality  PSYCH/NEURO: pleasant and cooperative, no obvious depression or anxiety, speech and thought processing grossly intact  ASSESSMENT AND PLAN: His urologic condition seems to have stabilized a bit. His renal function is normal. They will use the Triamcinolone cream on the leg rashes. Keep the legs elevated. His wife tried to increase the Lasix to 80 mg a day, but this wiped him out, so she went back to 40 mg daily. Use Thorazine at bedtime for sleep. I advised them to stop the Gabapentin because he dopes not need this any longer and it is too sedating. He does not have as much pain as before so they can use Percocet as needed (right now he averages 1.5 to 2 tablets a day).  Alysia Penna, MD  Discussed the following assessment and plan:  No diagnosis found.     I discussed the assessment and treatment plan with the patient. The patient was provided an opportunity to ask questions and all were answered. The patient agreed with the plan and demonstrated an understanding of the instructions.   The patient was advised to call back or seek an in-person evaluation if the symptoms worsen or if the condition fails to improve as anticipated.     Review of Systems     Objective:   Physical Exam        Assessment &  Plan:

## 2019-04-29 ENCOUNTER — Encounter: Payer: Self-pay | Admitting: Family Medicine

## 2019-05-01 ENCOUNTER — Encounter: Payer: Self-pay | Admitting: Family Medicine

## 2019-05-01 ENCOUNTER — Telehealth: Payer: Self-pay | Admitting: Family Medicine

## 2019-05-01 ENCOUNTER — Ambulatory Visit (INDEPENDENT_AMBULATORY_CARE_PROVIDER_SITE_OTHER): Payer: Medicare Other | Admitting: Family Medicine

## 2019-05-01 ENCOUNTER — Other Ambulatory Visit: Payer: Self-pay

## 2019-05-01 DIAGNOSIS — L03116 Cellulitis of left lower limb: Secondary | ICD-10-CM | POA: Diagnosis not present

## 2019-05-01 DIAGNOSIS — L03115 Cellulitis of right lower limb: Secondary | ICD-10-CM

## 2019-05-01 DIAGNOSIS — R600 Localized edema: Secondary | ICD-10-CM

## 2019-05-01 MED ORDER — POTASSIUM CHLORIDE CRYS ER 20 MEQ PO TBCR: 20.0000 meq | EXTENDED_RELEASE_TABLET | Freq: Every day | ORAL | 5 refills | Status: AC

## 2019-05-01 MED ORDER — DOXYCYCLINE HYCLATE 100 MG PO TABS: 100.0000 mg | ORAL_TABLET | Freq: Two times a day (BID) | ORAL | 0 refills | Status: AC

## 2019-05-01 NOTE — Telephone Encounter (Signed)
Copied from [place] [id number]. Topic: Quick Communication - See Telephone Encounter >> May 01, 2019  8:16 AM Robina Ade, Helene Kelp D wrote: CRM for notification. See Telephone encounter for: 05/01/19. Patient called and said that he has all the signs of shingles and would like to talk to Dr. Sarajane Jews or his CMA about this. Please call patient back.

## 2019-05-01 NOTE — Telephone Encounter (Signed)
Spoke with patients wife. Appointment has been made for today.

## 2019-05-01 NOTE — Progress Notes (Signed)
Subjective:    Patient ID: Charles Coffey, male    DOB: July 22, 1935, 84 y.o.   MRN: [id number]  HPI Virtual Visit via Video Note  I connected with the patient on 05/01/19 at 11:00 AM EDT by a video enabled telemedicine application and verified that I am speaking with the correct person using two identifiers.  Location patient: home Location provider:work or home office Persons participating in the virtual visit: patient, provider  I discussed the limitations of evaluation and management by telemedicine and the availability of in person appointments. The patient expressed understanding and agreed to proceed.   HPI: Here to discuss swelling in both legs and a rash on both lower legs. We spoke in a virtual visit last week about a mild rash on the legs that was itchy. We felt this was an allergic reaction to him wearing compression stockings. Last week we agreed to stop the stockings and to apply Triamcinolone cream. Over the past 2 days the rash has gotten worse and now it is painful. No fever. The rash is warm to the touch. His wife has stopped the Triamcinolone and has been applying Mupiricin oitment TID. Also his legs continue to swell quite a bit despite keeping them elevated. He has been taking Lasix 40 mg daily. No SOB.    ROS: See pertinent positives and negatives per HPI.  Past Medical History:  Diagnosis Date  . Aortic atherosclerosis ([place])   . BPH with urinary obstruction   . CAD (coronary artery disease)    a.  MI 1995, CABG x 3 2002 (patient says that he had LIMA and RIMA grafts). b. ETT-Cardiolite (10/15) with EF 48%, apical scar, no ischemia. c. Nuc 07/2017 abnormal -> cath was performed,  patent LIMA to LAD and SVG to OM 2. RIMA to RCA is atretic. The right coronary artery is occluded distally with extensive collaterals from the LAD, medical therapy.   . Cancer [facility])    bladder cancer  . Cervical spondylosis without myelopathy March 08, 202017  . Chronic low back pain    sees  Dr. Kary Kos   . GERD (gastroesophageal reflux disease)   . Hiatal hernia   . Hyperlipidemia   . Hypertension   . IBS (irritable bowel syndrome)   . Ischemic cardiomyopathy   . Myocardial infarction ([place])   . RBBB   . Restless legs syndrome   . Statin intolerance   . Syncope    a. in 2015 - no apparent cause, was taking sleep medicine at the time. Cardiac workup unremarkable.  . Tubular adenoma of colon     Past Surgical History:  Procedure Laterality Date  . COLONOSCOPY  10/17/2014   per Dr. Hilarie Fredrickson, tubular adenomas, repeat in 3 yrs   . COLONOSCOPY WITH PROPOFOL N/A 12/01/2017   Procedure: COLONOSCOPY WITH PROPOFOL;  Surgeon: Milus Banister, MD;  [facility];  Service: Endoscopy;  Laterality: N/A;  . CYSTOSCOPY W/ RETROGRADES Left 11/25/2017   Procedure: CYSTOSCOPY WITH RETROGRADE PYELOGRAM;  Surgeon: Irine Seal, MD;  [facility];  Service: Urology;  Laterality: Left;  . HEART BYPASS    . IR CATHETER TUBE CHANGE  11/08/2018  . IR CATHETER TUBE CHANGE  12/26/2018  . IR CATHETER TUBE CHANGE  01/08/2019  . IR CATHETER TUBE CHANGE  01/13/2019  . IR CATHETER TUBE CHANGE  03/16/2019  . IR CONVERT LEFT NEPHROSTOMY TO NEPHROURETERAL CATH  09/20/2018  . IR CONVERT RIGHT NEPHROSTOMY TO NEPHROURETERAL CATH  02/04/2018  . IR CONVERT  RIGHT NEPHROSTOMY TO NEPHROURETERAL CATH  08/23/2018  . IR EXT NEPHROURETERAL CATH EXCHANGE  04/11/2018  . IR EXT NEPHROURETERAL CATH EXCHANGE  07/04/2018  . IR EXT NEPHROURETERAL CATH EXCHANGE  09/20/2018  . IR EXT NEPHROURETERAL CATH EXCHANGE  11/08/2018  . IR EXT NEPHROURETERAL CATH EXCHANGE  11/08/2018  . IR EXT NEPHROURETERAL CATH EXCHANGE  12/26/2018  . IR EXT NEPHROURETERAL CATH EXCHANGE  12/26/2018  . IR EXT NEPHROURETERAL CATH EXCHANGE  01/17/2019  . IR FLUORO GUIDE CV LINE RIGHT  12/27/2017  . IR FLUORO GUIDED NEEDLE PLC ASPIRATION/INJECTION LOC  08/19/2018  . IR NEPHROSTOGRAM LEFT THRU EXISTING ACCESS  12/24/2017  . IR NEPHROSTOMY  EXCHANGE LEFT  01/28/2018  . IR NEPHROSTOMY EXCHANGE LEFT  02/04/2018  . IR NEPHROSTOMY EXCHANGE LEFT  02/25/2018  . IR NEPHROSTOMY EXCHANGE LEFT  07/04/2018  . IR NEPHROSTOMY EXCHANGE LEFT  07/05/2018  . IR NEPHROSTOMY EXCHANGE LEFT  08/09/2018  . IR NEPHROSTOMY EXCHANGE LEFT  08/16/2018  . IR NEPHROSTOMY EXCHANGE LEFT  08/23/2018  . IR NEPHROSTOMY EXCHANGE RIGHT  12/24/2017  . IR NEPHROSTOMY EXCHANGE RIGHT  01/28/2018  . IR NEPHROSTOMY EXCHANGE RIGHT  04/11/2018  . IR NEPHROSTOMY EXCHANGE RIGHT  07/05/2018  . IR NEPHROSTOMY EXCHANGE RIGHT  08/09/2018  . IR NEPHROSTOMY EXCHANGE RIGHT  08/19/2018  . IR NEPHROSTOMY PLACEMENT LEFT  11/28/2017  . IR NEPHROSTOMY PLACEMENT RIGHT  12/03/2017  . IR PATIENT EVAL TECH 0-60 MINS  03/24/2018  . IR PATIENT EVAL TECH 0-60 MINS  03/28/2018  . IR PATIENT EVAL TECH 0-60 MINS  04/25/2018  . IR PATIENT EVAL TECH 0-60 MINS  06/20/2018  . IR URETERAL STENT PLACEMENT EXISTING ACCESS LEFT  02/13/2019  . IR URETERAL STENT PLACEMENT EXISTING ACCESS RIGHT  02/13/2019  . IR US GUIDE VASC ACCESS RIGHT  12/27/2017  . LAPAROSCOPY N/A 12/01/2017   Procedure: LAPAROSCOPIC DIVERTING OSTOMY;  Surgeon: Stark Klein, MD;  [facility];  Service: General;  Laterality: N/A;  . LEFT HEART CATH AND CORS/GRAFTS ANGIOGRAPHY N/A 08/25/2017   Procedure: LEFT HEART CATH AND CORS/GRAFTS ANGIOGRAPHY;  Surgeon: Wellington Hampshire, MD;  Location: [place] [facility];  Service: Cardiovascular;  Laterality: N/A;  . LUMBAR FUSION  2003   L3-L4  . PROSTATE SURGERY  06-27-12   per Dr. Roni Bread, had CTT  . TONSILLECTOMY    . TRANSURETHRAL RESECTION OF BLADDER TUMOR N/A 11/25/2017   Procedure: TRANSURETHRAL RESECTION OF BLADDER TUMOR (TURBT);  Surgeon: Irine Seal, MD;  [facility];  Service: Urology;  Laterality: N/A;    Family History  Problem Relation Age of Onset  . Heart attack Mother   . Aneurysm Father        femoral artery  . Heart disease Brother   . Heart disease Maternal Uncle        x 2  .  Colon cancer Neg Hx   . Esophageal cancer Neg Hx   . Pancreatic cancer Neg Hx   . Kidney disease Neg Hx   . Liver disease Neg Hx      Current Outpatient Medications:  .  chlorproMAZINE (THORAZINE) 50 MG tablet, Take 2 tablets (100 mg total) by mouth at bedtime., Disp: 2 tablet, Rfl: 0 .  famotidine (PEPCID) 20 MG tablet, Take 1 tablet (20 mg total) by mouth 2 (two) times daily., Disp: 180 tablet, Rfl: 0 .  feeding supplement (BOOST HIGH PROTEIN) LIQD, Take 1 Container by mouth 2 (two) times daily between meals., Disp: , Rfl:  .  furosemide (LASIX) 40 MG tablet, Take 1 tablet (40 mg total) by mouth daily., Disp: 30 tablet, Rfl: 0 .  lactose free nutrition (BOOST PLUS) LIQD, Take 237 mLs by mouth 3 (three) times daily with meals., Disp: 10 Can, Rfl: 0 .  Multiple Vitamin (MULTIVITAMIN WITH MINERALS) TABS tablet, Take 1 tablet by mouth daily., Disp: 30 tablet, Rfl: 0 .  oxyCODONE-acetaminophen (PERCOCET) 10-325 MG tablet, Take 1 tablet by mouth every 4 (four) hours as needed for pain., Disp: 90 tablet, Rfl: 0 .  PARoxetine (PAXIL) 10 MG tablet, Take 1 tablet (10 mg total) by mouth daily., Disp: 1 tablet, Rfl: 0 .  polyethylene glycol powder (GLYCOLAX/MIRALAX) 17 GM/SCOOP powder, Take 17 g by mouth daily., Disp: 3350 g, Rfl: 1 .  pramipexole (MIRAPEX) 0.5 MG tablet, Take 3 tablets (1.5 mg total) by mouth at bedtime., Disp: 240 tablet, Rfl: 0 .  QUEtiapine (SEROQUEL) 25 MG tablet, Take 1 tablet (25 mg total) by mouth at bedtime., Disp: 60 tablet, Rfl: 0 .  triamcinolone cream (KENALOG) 0.1 %, Apply 1 application topically 2 (two) times daily., Disp: 30 g, Rfl: 0 .  doxycycline (VIBRA-TABS) 100 MG tablet, Take 1 tablet (100 mg total) by mouth 2 (two) times daily., Disp: 20 tablet, Rfl: 0 .  potassium chloride SA (K-DUR) 20 MEQ tablet, Take 1 tablet (20 mEq total) by mouth daily., Disp: 30 tablet, Rfl: 5  EXAM: SKIN: both lower legs have areas of erythema which consists of confluent patches. This  is macular, no vesicles are seen. Both legs show 2+ edema. VITALS per patient if applicable:  GENERAL: alert, oriented, appears well and in no acute distress  HEENT: atraumatic, conjunttiva clear, no obvious abnormalities on inspection of external nose and ears  NECK: normal movements of the head and neck  LUNGS: on inspection no signs of respiratory distress, breathing rate appears normal, no obvious gross SOB, gasping or wheezing  CV: no obvious cyanosis  MS: moves all visible extremities without noticeable abnormality  PSYCH/NEURO: pleasant and cooperative, no obvious depression or anxiety, speech and thought processing grossly intact  ASSESSMENT AND PLAN: He seems to have developed some cellulitis in the legs, so they will apply Mupiricin ointment TID and we will add Doxycycline for 10 days. For the edema they will increase the Lasix to 40 mg BID. We will add potassium 20 mEq daily as well.  Alysia Penna, MD  Discussed the following assessment and plan:  No diagnosis found.     I discussed the assessment and treatment plan with the patient. The patient was provided an opportunity to ask questions and all were answered. The patient agreed with the plan and demonstrated an understanding of the instructions.   The patient was advised to call back or seek an in-person evaluation if the symptoms worsen or if the condition fails to improve as anticipated.     Review of Systems     Objective:   Physical Exam        Assessment & Plan:

## 2019-05-01 NOTE — Telephone Encounter (Signed)
Yes , try the Mupiricin ointment for a week and let me know how its working

## 2019-05-01 NOTE — Telephone Encounter (Signed)
Patient had an appointment today. 

## 2019-05-04 ENCOUNTER — Encounter: Payer: Self-pay | Admitting: Family Medicine

## 2019-05-05 ENCOUNTER — Ambulatory Visit (INDEPENDENT_AMBULATORY_CARE_PROVIDER_SITE_OTHER): Payer: Medicare Other | Admitting: Family Medicine

## 2019-05-05 ENCOUNTER — Encounter: Payer: Self-pay | Admitting: Family Medicine

## 2019-05-05 ENCOUNTER — Other Ambulatory Visit: Payer: Self-pay

## 2019-05-05 DIAGNOSIS — M79604 Pain in right leg: Secondary | ICD-10-CM | POA: Diagnosis not present

## 2019-05-05 DIAGNOSIS — C679 Malignant neoplasm of bladder, unspecified: Secondary | ICD-10-CM | POA: Diagnosis not present

## 2019-05-05 DIAGNOSIS — M79605 Pain in left leg: Secondary | ICD-10-CM

## 2019-05-05 DIAGNOSIS — R600 Localized edema: Secondary | ICD-10-CM

## 2019-05-05 MED ORDER — OXYCODONE HCL 20 MG PO TABS: 1.0000 | ORAL_TABLET | ORAL | 0 refills | Status: AC | PRN

## 2019-05-05 NOTE — Progress Notes (Signed)
Subjective:    Patient ID: Charles Coffey, male    DOB: Jul 26, 1935, 84 y.o.   MRN: [id number]  HPI Virtual Visit via Video Note  I connected with the patient on 05/05/19 at 11:00 AM EDT by a video enabled telemedicine application and verified that I am speaking with the correct person using two identifiers.  Location patient: home Location provider:work or home office Persons participating in the virtual visit: patient, provider  I discussed the limitations of evaluation and management by telemedicine and the availability of in person appointments. The patient expressed understanding and agreed to proceed.   HPI: Here to follow up on leg rash and leg pain. He has had significant swelling of both legs and now the scrotum is swollen as well. They discussed this with his urologist, and he thought this was partly the result of the bladder cancer blocking pelvic lymphatic vessels,and I agreed with this. He just finished a course Doxycycline and topical Mupiricin, but these did not help much. He stopped the Gabapentin last week as I suggested, and he has been much less sedated since then. Both Charles Coffey and his wife are pleased with this, but the removal of the Gabapentin has also increased his pain levels. Last night his wife had to give him 2 tabs at a time of the Percocet for pain, but this did work fairly well.    ROS: See pertinent positives and negatives per HPI.  Past Medical History:  Diagnosis Date  . Aortic atherosclerosis ([place])   . BPH with urinary obstruction   . CAD (coronary artery disease)    a.  MI 1995, CABG x 3 2002 (patient says that he had LIMA and RIMA grafts). b. ETT-Cardiolite (10/15) with EF 48%, apical scar, no ischemia. c. Nuc 07/2017 abnormal -> cath was performed,  patent LIMA to LAD and SVG to OM 2. RIMA to RCA is atretic. The right coronary artery is occluded distally with extensive collaterals from the LAD, medical therapy.   . Cancer [facility])    bladder cancer  .  Cervical spondylosis without myelopathy 07/19/202017  . Chronic low back pain    sees Dr. Kary Kos   . GERD (gastroesophageal reflux disease)   . Hiatal hernia   . Hyperlipidemia   . Hypertension   . IBS (irritable bowel syndrome)   . Ischemic cardiomyopathy   . Myocardial infarction ([place])   . RBBB   . Restless legs syndrome   . Statin intolerance   . Syncope    a. in 2015 - no apparent cause, was taking sleep medicine at the time. Cardiac workup unremarkable.  . Tubular adenoma of colon     Past Surgical History:  Procedure Laterality Date  . COLONOSCOPY  10/17/2014   per Dr. Hilarie Fredrickson, tubular adenomas, repeat in 3 yrs   . COLONOSCOPY WITH PROPOFOL N/A 12/01/2017   Procedure: COLONOSCOPY WITH PROPOFOL;  Surgeon: Milus Banister, MD;  [facility];  Service: Endoscopy;  Laterality: N/A;  . CYSTOSCOPY W/ RETROGRADES Left 11/25/2017   Procedure: CYSTOSCOPY WITH RETROGRADE PYELOGRAM;  Surgeon: Irine Seal, MD;  [facility];  Service: Urology;  Laterality: Left;  . HEART BYPASS    . IR CATHETER TUBE CHANGE  11/08/2018  . IR CATHETER TUBE CHANGE  12/26/2018  . IR CATHETER TUBE CHANGE  01/08/2019  . IR CATHETER TUBE CHANGE  01/13/2019  . IR CATHETER TUBE CHANGE  03/16/2019  . IR CONVERT LEFT NEPHROSTOMY TO NEPHROURETERAL CATH  09/20/2018  .  IR CONVERT RIGHT NEPHROSTOMY TO NEPHROURETERAL CATH  02/04/2018  . IR CONVERT RIGHT NEPHROSTOMY TO NEPHROURETERAL CATH  08/23/2018  . IR EXT NEPHROURETERAL CATH EXCHANGE  04/11/2018  . IR EXT NEPHROURETERAL CATH EXCHANGE  07/04/2018  . IR EXT NEPHROURETERAL CATH EXCHANGE  09/20/2018  . IR EXT NEPHROURETERAL CATH EXCHANGE  11/08/2018  . IR EXT NEPHROURETERAL CATH EXCHANGE  11/08/2018  . IR EXT NEPHROURETERAL CATH EXCHANGE  12/26/2018  . IR EXT NEPHROURETERAL CATH EXCHANGE  12/26/2018  . IR EXT NEPHROURETERAL CATH EXCHANGE  01/17/2019  . IR FLUORO GUIDE CV LINE RIGHT  12/27/2017  . IR FLUORO GUIDED NEEDLE PLC ASPIRATION/INJECTION LOC  08/19/2018   . IR NEPHROSTOGRAM LEFT THRU EXISTING ACCESS  12/24/2017  . IR NEPHROSTOMY EXCHANGE LEFT  01/28/2018  . IR NEPHROSTOMY EXCHANGE LEFT  02/04/2018  . IR NEPHROSTOMY EXCHANGE LEFT  02/25/2018  . IR NEPHROSTOMY EXCHANGE LEFT  07/04/2018  . IR NEPHROSTOMY EXCHANGE LEFT  07/05/2018  . IR NEPHROSTOMY EXCHANGE LEFT  08/09/2018  . IR NEPHROSTOMY EXCHANGE LEFT  08/16/2018  . IR NEPHROSTOMY EXCHANGE LEFT  08/23/2018  . IR NEPHROSTOMY EXCHANGE RIGHT  12/24/2017  . IR NEPHROSTOMY EXCHANGE RIGHT  01/28/2018  . IR NEPHROSTOMY EXCHANGE RIGHT  04/11/2018  . IR NEPHROSTOMY EXCHANGE RIGHT  07/05/2018  . IR NEPHROSTOMY EXCHANGE RIGHT  08/09/2018  . IR NEPHROSTOMY EXCHANGE RIGHT  08/19/2018  . IR NEPHROSTOMY PLACEMENT LEFT  11/28/2017  . IR NEPHROSTOMY PLACEMENT RIGHT  12/03/2017  . IR PATIENT EVAL TECH 0-60 MINS  03/24/2018  . IR PATIENT EVAL TECH 0-60 MINS  03/28/2018  . IR PATIENT EVAL TECH 0-60 MINS  04/25/2018  . IR PATIENT EVAL TECH 0-60 MINS  06/20/2018  . IR URETERAL STENT PLACEMENT EXISTING ACCESS LEFT  02/13/2019  . IR URETERAL STENT PLACEMENT EXISTING ACCESS RIGHT  02/13/2019  . IR US GUIDE VASC ACCESS RIGHT  12/27/2017  . LAPAROSCOPY N/A 12/01/2017   Procedure: LAPAROSCOPIC DIVERTING OSTOMY;  Surgeon: Stark Klein, MD;  [facility];  Service: General;  Laterality: N/A;  . LEFT HEART CATH AND CORS/GRAFTS ANGIOGRAPHY N/A 08/25/2017   Procedure: LEFT HEART CATH AND CORS/GRAFTS ANGIOGRAPHY;  Surgeon: Wellington Hampshire, MD;  Location: [place] [facility];  Service: Cardiovascular;  Laterality: N/A;  . LUMBAR FUSION  2003   L3-L4  . PROSTATE SURGERY  06-27-12   per Dr. Roni Bread, had CTT  . TONSILLECTOMY    . TRANSURETHRAL RESECTION OF BLADDER TUMOR N/A 11/25/2017   Procedure: TRANSURETHRAL RESECTION OF BLADDER TUMOR (TURBT);  Surgeon: Irine Seal, MD;  [facility];  Service: Urology;  Laterality: N/A;    Family History  Problem Relation Age of Onset  . Heart attack Mother   . Aneurysm Father        femoral artery   . Heart disease Brother   . Heart disease Maternal Uncle        x 2  . Colon cancer Neg Hx   . Esophageal cancer Neg Hx   . Pancreatic cancer Neg Hx   . Kidney disease Neg Hx   . Liver disease Neg Hx      Current Outpatient Medications:  .  chlorproMAZINE (THORAZINE) 50 MG tablet, Take 2 tablets (100 mg total) by mouth at bedtime., Disp: 2 tablet, Rfl: 0 .  doxycycline (VIBRA-TABS) 100 MG tablet, Take 1 tablet (100 mg total) by mouth 2 (two) times daily., Disp: 20 tablet, Rfl: 0 .  famotidine (PEPCID) 20 MG tablet, Take 1 tablet (20 mg  total) by mouth 2 (two) times daily., Disp: 180 tablet, Rfl: 0 .  feeding supplement (BOOST HIGH PROTEIN) LIQD, Take 1 Container by mouth 2 (two) times daily between meals., Disp: , Rfl:  .  furosemide (LASIX) 40 MG tablet, Take 1 tablet (40 mg total) by mouth daily., Disp: 30 tablet, Rfl: 0 .  lactose free nutrition (BOOST PLUS) LIQD, Take 237 mLs by mouth 3 (three) times daily with meals., Disp: 10 Can, Rfl: 0 .  Multiple Vitamin (MULTIVITAMIN WITH MINERALS) TABS tablet, Take 1 tablet by mouth daily., Disp: 30 tablet, Rfl: 0 .  PARoxetine (PAXIL) 10 MG tablet, Take 1 tablet (10 mg total) by mouth daily., Disp: 1 tablet, Rfl: 0 .  polyethylene glycol powder (GLYCOLAX/MIRALAX) 17 GM/SCOOP powder, Take 17 g by mouth daily., Disp: 3350 g, Rfl: 1 .  potassium chloride SA (K-DUR) 20 MEQ tablet, Take 1 tablet (20 mEq total) by mouth daily., Disp: 30 tablet, Rfl: 5 .  pramipexole (MIRAPEX) 0.5 MG tablet, Take 3 tablets (1.5 mg total) by mouth at bedtime., Disp: 240 tablet, Rfl: 0 .  QUEtiapine (SEROQUEL) 25 MG tablet, Take 1 tablet (25 mg total) by mouth at bedtime., Disp: 60 tablet, Rfl: 0 .  triamcinolone cream (KENALOG) 0.1 %, Apply 1 application topically 2 (two) times daily., Disp: 30 g, Rfl: 0 .  Oxycodone HCl 20 MG TABS, Take 1 tablet (20 mg total) by mouth every 4 (four) hours as needed (pain)., Disp: 180 tablet, Rfl: 0  EXAM:  VITALS per patient if  applicable:  GENERAL: alert, oriented, appears well and in no acute distress  HEENT: atraumatic, conjunttiva clear, no obvious abnormalities on inspection of external nose and ears  NECK: normal movements of the head and neck  LUNGS: on inspection no signs of respiratory distress, breathing rate appears normal, no obvious gross SOB, gasping or wheezing  CV: no obvious cyanosis  MS: moves all visible extremities without noticeable abnormality  PSYCH/NEURO: pleasant and cooperative, no obvious depression or anxiety, speech and thought processing grossly intact  ASSESSMENT AND PLAN: The leg edema is due to lymphedema as a result of the bladder cancer. There is not much else we can do about that other than to keep the legs elevated. For the infection, he has finished oral antibiotics but they will continue to apply Mupiricin. Stay off Gabapentin due to sedation side effects. For pain control we will stop the Percocet and use immediate release 20 mg Oxycodone. This will avoid any concerns about acetaminophen toxicity. Recheck prn. Alysia Penna, MD  Discussed the following assessment and plan:  No diagnosis found.     I discussed the assessment and treatment plan with the patient. The patient was provided an opportunity to ask questions and all were answered. The patient agreed with the plan and demonstrated an understanding of the instructions.   The patient was advised to call back or seek an in-person evaluation if the symptoms worsen or if the condition fails to improve as anticipated.     Review of Systems     Objective:   Physical Exam        Assessment & Plan:

## 2019-05-08 ENCOUNTER — Encounter: Payer: Self-pay | Admitting: Family Medicine

## 2019-05-09 NOTE — Telephone Encounter (Signed)
Dr. Fry please advise. Thanks  

## 2019-05-09 NOTE — Telephone Encounter (Signed)
As for the right sided pain, I do not think this a medication effect. Continue on Oxycodone. This may be from his gall bladder. The way to check for that would be an abdominal US, but I think I would like to have him come into the office for an exam first. Yes we could switch back to Palliative care if they want to do that

## 2019-05-15 ENCOUNTER — Encounter: Payer: Self-pay | Admitting: General Practice

## 2019-05-15 NOTE — Progress Notes (Signed)
[place] Note  Returned a call from [name]'s wife [name] this morning, providing emotional and anticipatory grief support as [name] approaches EOL. I will help [name] plan and officiate a virtual memorial service(s) when the time comes. Knowing this gives her a sense of peace and comfort in the midst of turmoil and challenges ([name]'s insomnia, calling out, agitation). She knows to call me anytime and how to reach me directly.   [place], [place], [facility] Pager [phone] Voicemail [phone]

## 2019-05-16 ENCOUNTER — Encounter: Payer: Self-pay | Admitting: General Practice

## 2019-05-24 ENCOUNTER — Encounter: Payer: Self-pay | Admitting: Oncology

## 2019-05-29 NOTE — Progress Notes (Signed)
[place] Spiritual Care Note  Received call from [place], [name]'s wife, that he died around 4:00 this morning. [name] and I have plans in the coming days to prepare his memorial service to be shared over a [employer] such as Zoom. I will continue to provide emotional support to her through this process.   [place], [place], [facility] Pager [phone] Voicemail [phone]

## 2019-05-29 DEATH — deceased

## 2019-07-11 ENCOUNTER — Ambulatory Visit: Payer: Medicare HMO | Admitting: Neurology

## 2020-04-17 IMAGING — CT CT RENAL STONE PROTOCOL
2 of 4 series · 15 of 46 positions shown, 17 images · non-contrast
Comparison: CT the abdomen and pelvis 10/24/2018.

CLINICAL DATA: 83-year-old male with history of abdominal pain,
fever and bladder spasms. Recurrent urinary tract infections.
History of bladder cancer.

EXAM:
CT ABDOMEN AND PELVIS WITHOUT CONTRAST
TECHNIQUE: Multidetector CT imaging of the abdomen and pelvis was performed
following the standard protocol without IV contrast.

[Series 2: axial st · axial · 0.73mm/px · z∈[+943,+1358]mm · 12 of 95 slices shown, 14 images]
[im 6/95  soft-tissue]
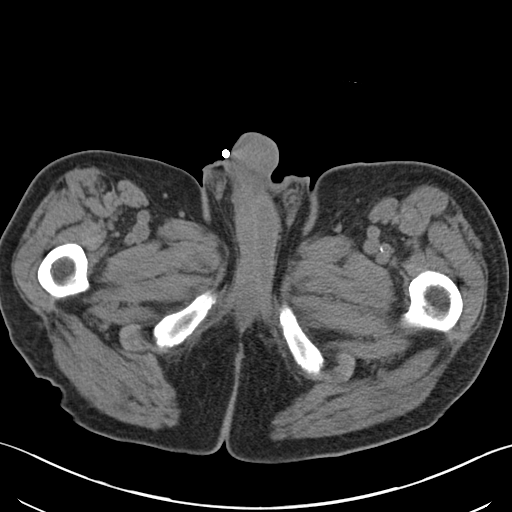
[im 6/95  bone]
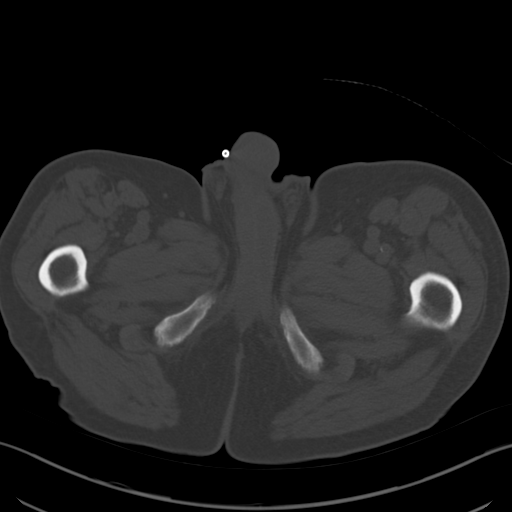
[im 16/95  soft-tissue]
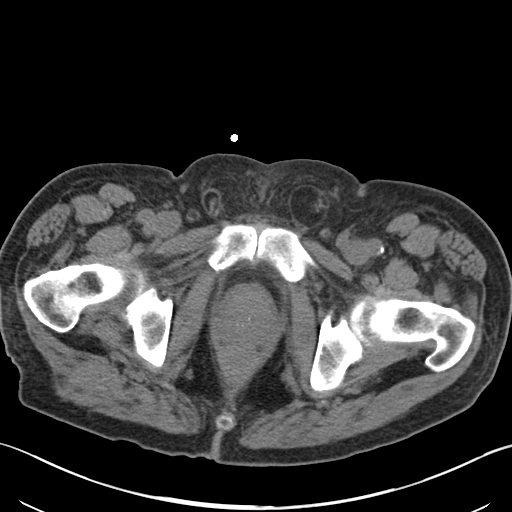
[im 21/95  soft-tissue]
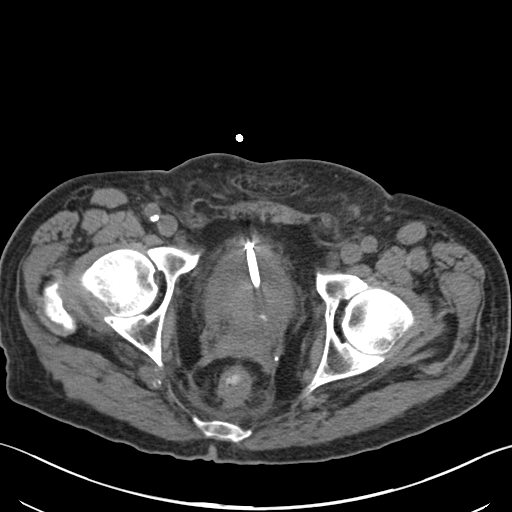
[im 27/95  soft-tissue]
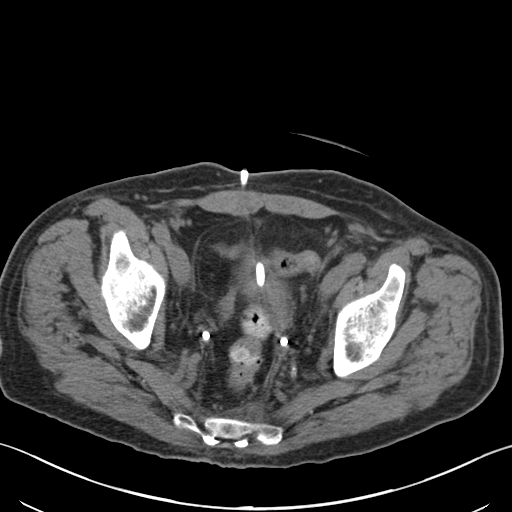
[im 37/95  soft-tissue]
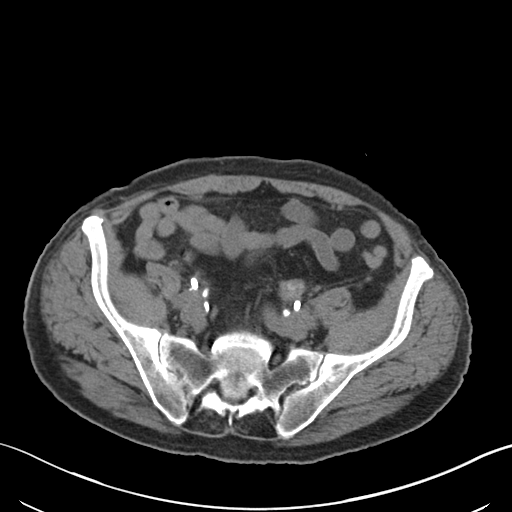
[im 42/95  soft-tissue]
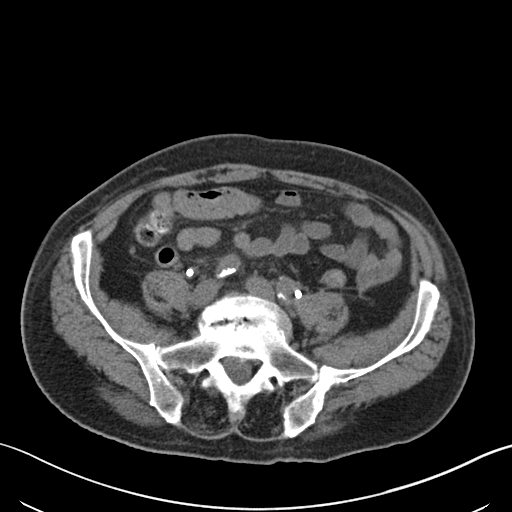
[im 53/95  soft-tissue]
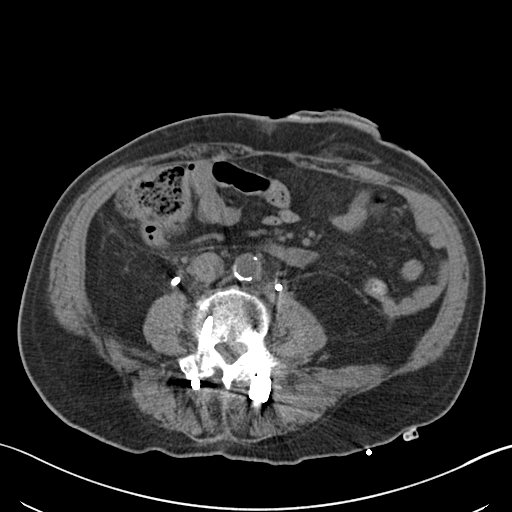
[im 58/95  soft-tissue]
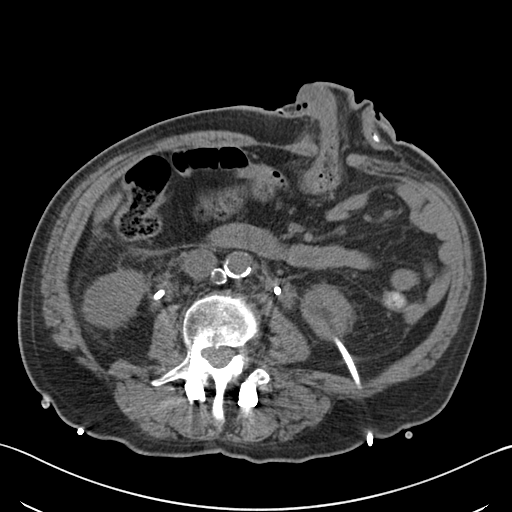
[im 68/95  soft-tissue]
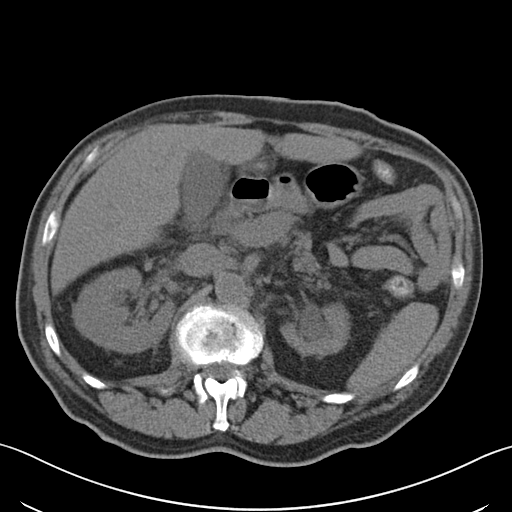
[im 68/95  bone]
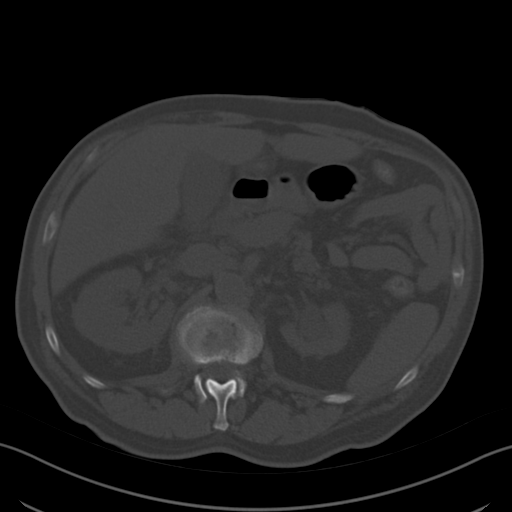
[im 74/95  soft-tissue]
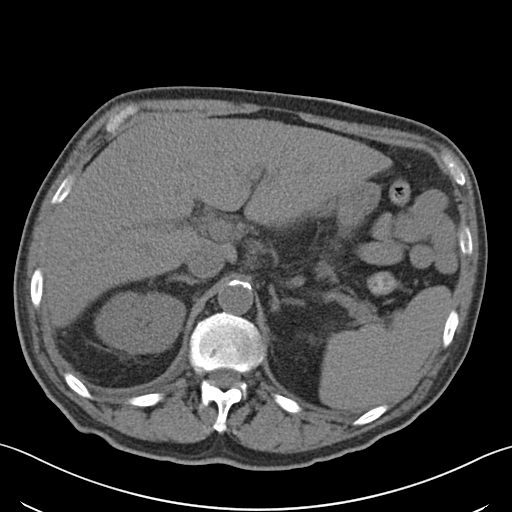
[im 79/95  soft-tissue]
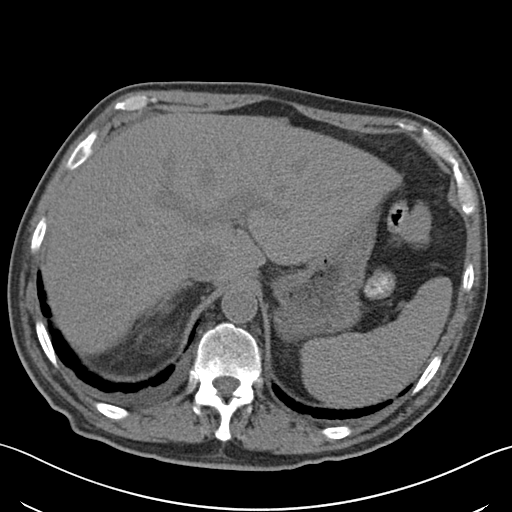
[im 89/95  soft-tissue]
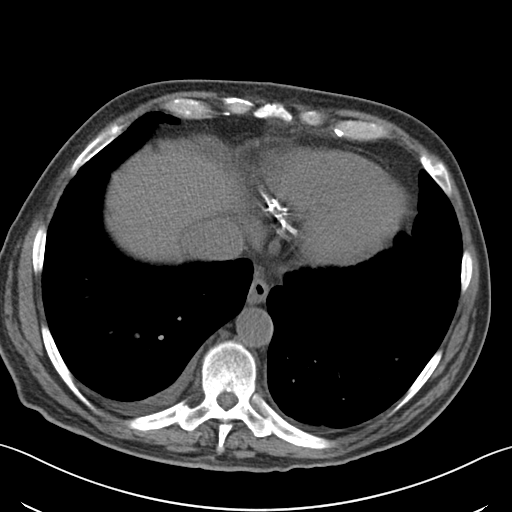

[Series 5: coronal · coronal · 0.72mm/px · 3 of 152 slices shown]
[im 51/152  soft-tissue]
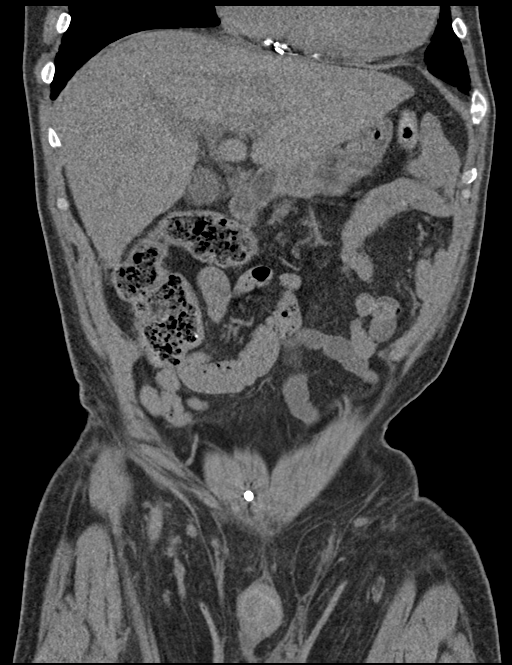
[im 68/152  soft-tissue]
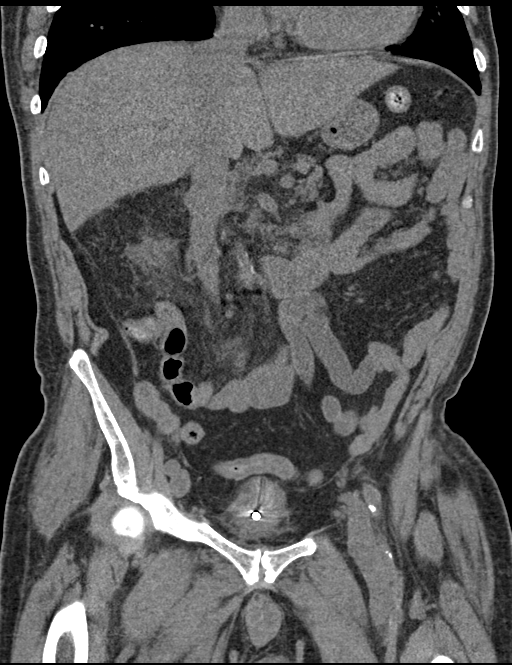
[im 84/152  soft-tissue]
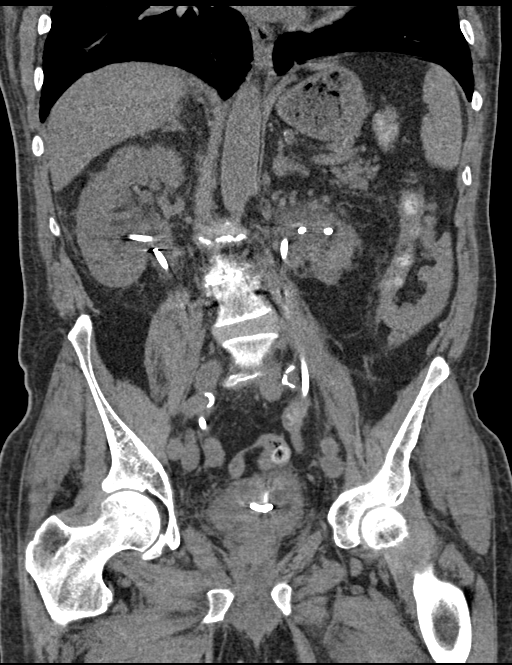

[15 of 46 positions shown; findings below may reference images not displayed]

FINDINGS: Lower chest: Atherosclerotic calcifications in the right coronary
artery. Small right and trace left pleural effusions lying
dependently.

Hepatobiliary: No definite suspicious cystic or solid hepatic
lesions are confidently identified on today's noncontrast CT
examination. Unenhanced appearance of the gallbladder is normal.

Pancreas: No definite pancreatic mass or peripancreatic fluid or
inflammatory changes noted on today's noncontrast CT examination.

Spleen: Unremarkable.

Adrenals/Urinary Tract: Bilateral nephrostomy tubes in position,
with extension catheters extending into the urinary bladder.
Suprapubic catheter extending into the urinary bladder. Left
extrarenal pelvis. Bilateral adrenal glands are normal in
appearance. Other than the indwelling catheters, urinary bladder is
otherwise normal in appearance.

Stomach/Bowel: Unenhanced appearance of the stomach is normal. No
pathologic dilatation of small bowel or colon. Numerous colonic
diverticulae are noted, without surrounding inflammatory changes to
suggest an acute diverticulitis at this time. Left upper quadrant
colostomy. Normal appendix.

Vascular/Lymphatic: Aortic atherosclerosis. No lymphadenopathy noted
in the abdomen or pelvis.

Reproductive: Prostate gland and seminal vesicles are unremarkable
in appearance.

Other: Trace volume of ascites.  No pneumoperitoneum.

Musculoskeletal: Status post PLIF at L3-L4. There are no aggressive
appearing lytic or blastic lesions noted in the visualized portions
of the skeleton.
IMPRESSION: 1. No acute findings are noted in the abdomen or pelvis to account
for the patient's symptoms.
2. Colonic diverticulosis without evidence of acute diverticulitis
at this time.
3. Small right and trace left pleural effusions lying dependently.
4. Aortic atherosclerosis, in addition to at least right coronary
artery disease.
5. Additional incidental findings, similar prior study, as above.

## 2020-04-17 IMAGING — DX DG CHEST 1V PORT
1 series · 1 of 1 positions shown · non-contrast
Comparison: October 10, 2018

CLINICAL DATA: Altered mental status.  Fever.

EXAM:
PORTABLE CHEST 1 VIEW

[chest ap]
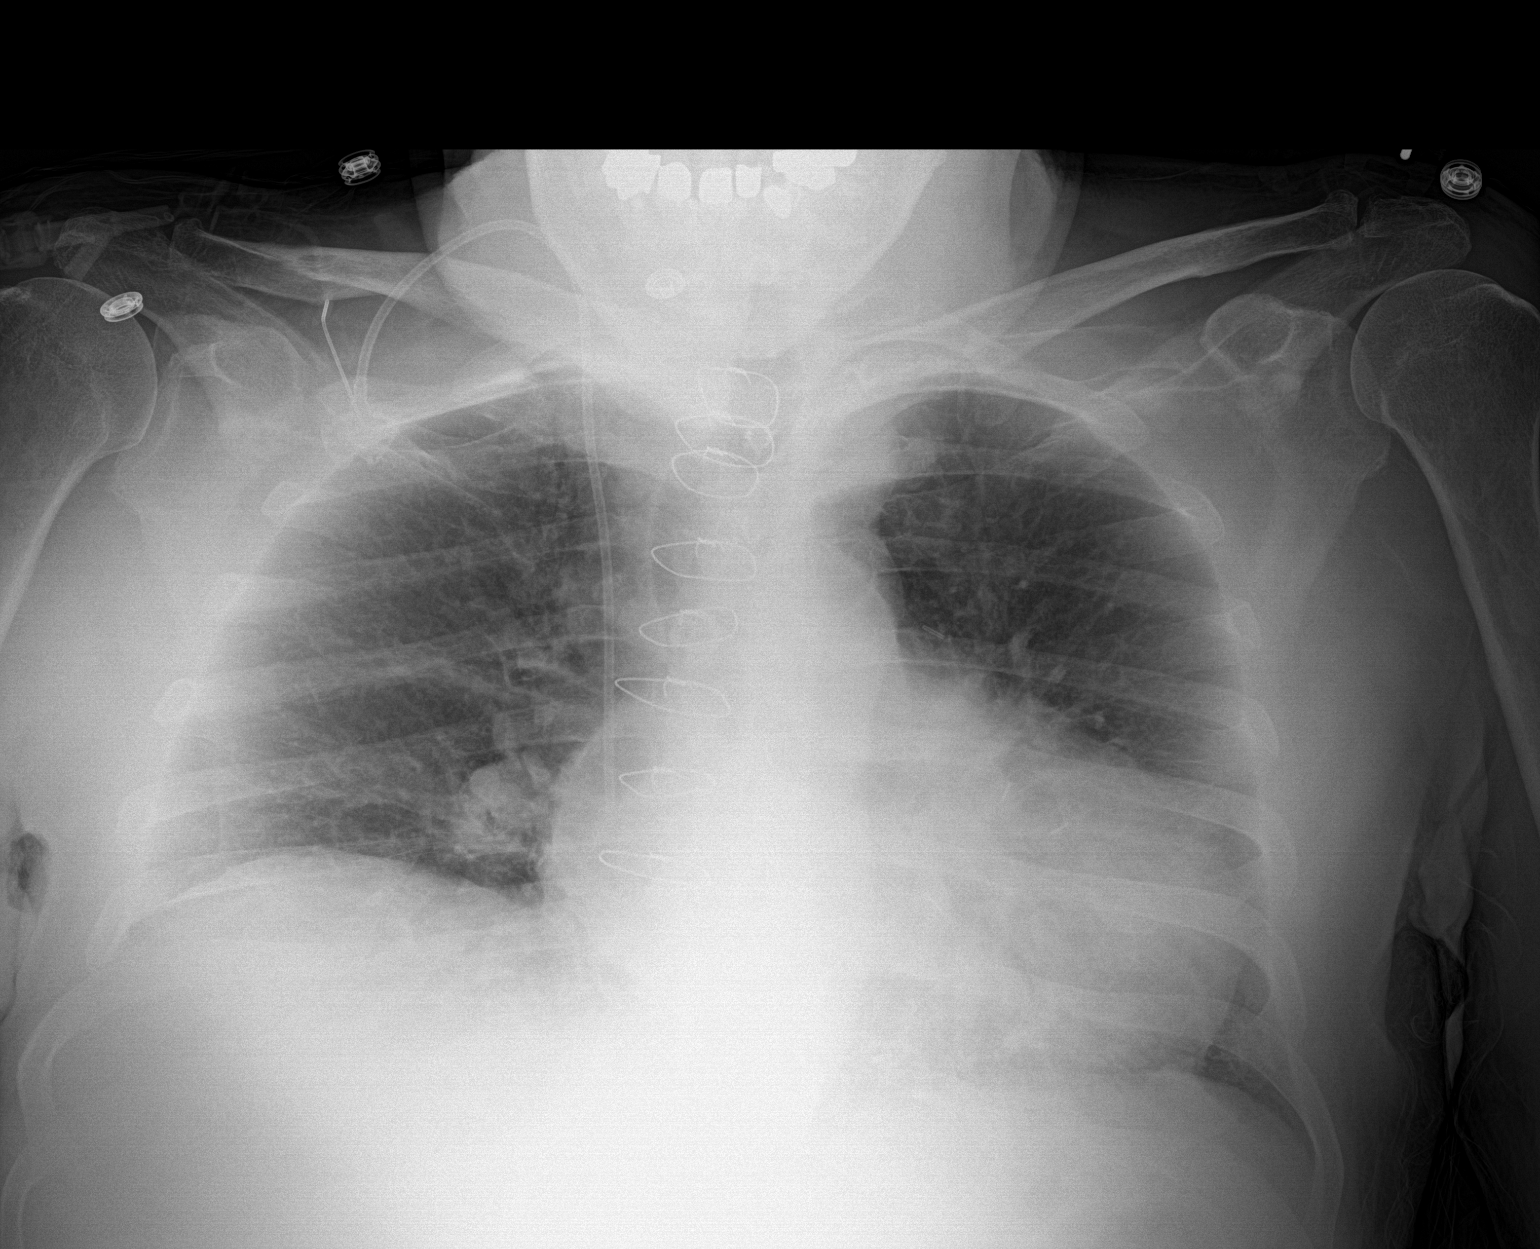

[1 of 1 positions shown; findings below may reference images not displayed]

FINDINGS: The right Port-A-Cath is stable. Cardiomegaly is similar in the me
interval. The hila and mediastinum are unchanged. No pneumothorax.
No other acute abnormalities.
IMPRESSION: Stable Port-A-Cath.  No cause for fever noted.
